# Patient Record
Sex: Female | Born: 1935
Health system: Southern US, Community
[De-identification: ages and names within clinical notes are randomized; demographics above are authoritative.]

## PROBLEM LIST (undated history)

## (undated) DIAGNOSIS — N951 Menopausal and female climacteric states: Secondary | ICD-10-CM

## (undated) DIAGNOSIS — R252 Cramp and spasm: Secondary | ICD-10-CM

## (undated) DIAGNOSIS — R911 Solitary pulmonary nodule: Secondary | ICD-10-CM

## (undated) DIAGNOSIS — J45909 Unspecified asthma, uncomplicated: Secondary | ICD-10-CM

## (undated) DIAGNOSIS — M797 Fibromyalgia: Secondary | ICD-10-CM

## (undated) DIAGNOSIS — C4492 Squamous cell carcinoma of skin, unspecified: Secondary | ICD-10-CM

## (undated) DIAGNOSIS — I499 Cardiac arrhythmia, unspecified: Secondary | ICD-10-CM

## (undated) DIAGNOSIS — K648 Other hemorrhoids: Secondary | ICD-10-CM

## (undated) DIAGNOSIS — E785 Hyperlipidemia, unspecified: Secondary | ICD-10-CM

## (undated) DIAGNOSIS — M1990 Unspecified osteoarthritis, unspecified site: Secondary | ICD-10-CM

## (undated) DIAGNOSIS — K5790 Diverticulosis of intestine, part unspecified, without perforation or abscess without bleeding: Secondary | ICD-10-CM

## (undated) DIAGNOSIS — N390 Urinary tract infection, site not specified: Secondary | ICD-10-CM

## (undated) DIAGNOSIS — K635 Polyp of colon: Secondary | ICD-10-CM

## (undated) DIAGNOSIS — E039 Hypothyroidism, unspecified: Secondary | ICD-10-CM

## (undated) DIAGNOSIS — L439 Lichen planus, unspecified: Secondary | ICD-10-CM

## (undated) HISTORY — DX: Lichen planus, unspecified: L43.9

## (undated) HISTORY — DX: Solitary pulmonary nodule: R91.1

## (undated) HISTORY — PX: SKIN CANCER EXCISION: SHX779

## (undated) HISTORY — DX: Hyperlipidemia, unspecified: E78.5

## (undated) HISTORY — DX: Cramp and spasm: R25.2

## (undated) HISTORY — PX: KNEE SURGERY: SHX244

## (undated) HISTORY — DX: Hypothyroidism, unspecified: E03.9

## (undated) HISTORY — PX: CHOLECYSTECTOMY: SHX55

## (undated) HISTORY — DX: Other hemorrhoids: K64.8

## (undated) HISTORY — PX: TOE SURGERY: SHX1073

## (undated) HISTORY — DX: Urinary tract infection, site not specified: N39.0

## (undated) HISTORY — DX: Unspecified osteoarthritis, unspecified site: M19.90

## (undated) HISTORY — PX: ROTATOR CUFF REPAIR: SHX139

## (undated) HISTORY — PX: MULTIPLE TOOTH EXTRACTIONS: SHX2053

## (undated) HISTORY — DX: Polyp of colon: K63.5

## (undated) HISTORY — DX: Squamous cell carcinoma of skin, unspecified: C44.92

## (undated) HISTORY — PX: BREAST BIOPSY: SHX20

## (undated) HISTORY — PX: CATARACT EXTRACTION: SUR2

## (undated) HISTORY — DX: Menopausal and female climacteric states: N95.1

## (undated) HISTORY — DX: Diverticulosis of intestine, part unspecified, without perforation or abscess without bleeding: K57.90

## (undated) HISTORY — DX: Fibromyalgia: M79.7

---
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
## 1973-08-26 HISTORY — PX: ABDOMINAL HYSTERECTOMY: SUR658

## 1973-08-26 HISTORY — PX: APPENDECTOMY: SHX54

## 1996-08-26 HISTORY — PX: COLONOSCOPY: SHX174

## 1997-04-01 ENCOUNTER — Encounter: Payer: Self-pay | Admitting: Family Medicine

## 1997-04-08 ENCOUNTER — Encounter: Payer: Self-pay | Admitting: Family Medicine

## 1997-08-26 DIAGNOSIS — K635 Polyp of colon: Secondary | ICD-10-CM

## 1997-08-26 HISTORY — DX: Polyp of colon: K63.5

## 1997-12-14 ENCOUNTER — Encounter: Admission: RE | Admit: 1997-12-14 | Discharge: 1998-03-14 | Payer: Self-pay | Admitting: Family Medicine

## 1998-02-20 ENCOUNTER — Encounter: Admission: RE | Admit: 1998-02-20 | Discharge: 1998-05-21 | Payer: Self-pay

## 1998-11-25 HISTORY — PX: OTHER SURGICAL HISTORY: SHX169

## 1999-06-25 ENCOUNTER — Ambulatory Visit (HOSPITAL_COMMUNITY): Admission: RE | Admit: 1999-06-25 | Discharge: 1999-06-25 | Payer: Self-pay | Admitting: Family Medicine

## 1999-07-18 ENCOUNTER — Encounter: Payer: Self-pay | Admitting: Family Medicine

## 1999-07-18 ENCOUNTER — Encounter: Admission: RE | Admit: 1999-07-18 | Discharge: 1999-07-18 | Payer: Self-pay | Admitting: Family Medicine

## 2000-07-21 ENCOUNTER — Encounter: Admission: RE | Admit: 2000-07-21 | Discharge: 2000-07-21 | Payer: Self-pay | Admitting: *Deleted

## 2000-07-21 ENCOUNTER — Encounter: Payer: Self-pay | Admitting: *Deleted

## 2001-01-24 HISTORY — PX: OTHER SURGICAL HISTORY: SHX169

## 2001-02-10 ENCOUNTER — Encounter: Admission: RE | Admit: 2001-02-10 | Discharge: 2001-02-10 | Payer: Self-pay | Admitting: Family Medicine

## 2001-02-10 ENCOUNTER — Encounter: Payer: Self-pay | Admitting: Family Medicine

## 2001-02-23 ENCOUNTER — Encounter: Payer: Self-pay | Admitting: Family Medicine

## 2001-02-23 ENCOUNTER — Ambulatory Visit (HOSPITAL_COMMUNITY): Admission: RE | Admit: 2001-02-23 | Discharge: 2001-02-23 | Payer: Self-pay | Admitting: Family Medicine

## 2001-10-24 HISTORY — PX: OTHER SURGICAL HISTORY: SHX169

## 2002-08-26 HISTORY — PX: ESOPHAGOGASTRODUODENOSCOPY: SHX1529

## 2002-09-26 HISTORY — PX: COLONOSCOPY: SHX174

## 2002-09-27 ENCOUNTER — Encounter: Admission: RE | Admit: 2002-09-27 | Discharge: 2002-12-16 | Payer: Self-pay

## 2003-07-28 ENCOUNTER — Encounter: Admission: RE | Admit: 2003-07-28 | Discharge: 2003-09-15 | Payer: Self-pay | Admitting: Orthopaedic Surgery

## 2004-10-10 ENCOUNTER — Ambulatory Visit: Payer: Self-pay | Admitting: Family Medicine

## 2004-10-16 ENCOUNTER — Ambulatory Visit: Payer: Self-pay | Admitting: Family Medicine

## 2004-12-03 ENCOUNTER — Encounter: Admission: RE | Admit: 2004-12-03 | Discharge: 2005-03-03 | Payer: Self-pay | Admitting: Orthopaedic Surgery

## 2005-01-09 ENCOUNTER — Ambulatory Visit: Payer: Self-pay | Admitting: Family Medicine

## 2005-01-23 ENCOUNTER — Ambulatory Visit: Payer: Self-pay | Admitting: Family Medicine

## 2005-01-23 LAB — FECAL OCCULT BLOOD, GUAIAC: Fecal Occult Blood: NEGATIVE

## 2005-03-27 ENCOUNTER — Ambulatory Visit: Payer: Self-pay | Admitting: Family Medicine

## 2005-06-11 ENCOUNTER — Ambulatory Visit: Payer: Self-pay | Admitting: Family Medicine

## 2005-09-27 ENCOUNTER — Ambulatory Visit: Payer: Self-pay | Admitting: Family Medicine

## 2006-05-27 ENCOUNTER — Ambulatory Visit: Payer: Self-pay | Admitting: Family Medicine

## 2006-07-14 ENCOUNTER — Ambulatory Visit: Payer: Self-pay | Admitting: Family Medicine

## 2006-07-28 ENCOUNTER — Ambulatory Visit: Payer: Self-pay | Admitting: Family Medicine

## 2006-08-18 ENCOUNTER — Ambulatory Visit: Payer: Self-pay | Admitting: Family Medicine

## 2006-12-12 ENCOUNTER — Ambulatory Visit: Payer: Self-pay | Admitting: Family Medicine

## 2007-05-27 ENCOUNTER — Ambulatory Visit: Payer: Self-pay | Admitting: Family Medicine

## 2007-05-28 ENCOUNTER — Encounter: Payer: Self-pay | Admitting: Family Medicine

## 2007-07-01 ENCOUNTER — Ambulatory Visit: Payer: Self-pay | Admitting: Family Medicine

## 2007-07-02 ENCOUNTER — Telehealth (INDEPENDENT_AMBULATORY_CARE_PROVIDER_SITE_OTHER): Payer: Self-pay | Admitting: Internal Medicine

## 2007-07-13 ENCOUNTER — Telehealth: Payer: Self-pay | Admitting: Family Medicine

## 2007-12-09 ENCOUNTER — Encounter: Payer: Self-pay | Admitting: Family Medicine

## 2007-12-25 HISTORY — PX: COLONOSCOPY: SHX174

## 2007-12-29 ENCOUNTER — Ambulatory Visit: Payer: Self-pay | Admitting: Internal Medicine

## 2008-01-07 ENCOUNTER — Ambulatory Visit: Payer: Self-pay | Admitting: Internal Medicine

## 2008-01-07 LAB — HM COLONOSCOPY

## 2008-01-25 ENCOUNTER — Ambulatory Visit: Payer: Self-pay | Admitting: Family Medicine

## 2008-01-25 DIAGNOSIS — M1990 Unspecified osteoarthritis, unspecified site: Secondary | ICD-10-CM | POA: Insufficient documentation

## 2008-01-25 DIAGNOSIS — IMO0001 Reserved for inherently not codable concepts without codable children: Secondary | ICD-10-CM | POA: Insufficient documentation

## 2008-01-25 DIAGNOSIS — K589 Irritable bowel syndrome without diarrhea: Secondary | ICD-10-CM | POA: Insufficient documentation

## 2008-01-25 DIAGNOSIS — J309 Allergic rhinitis, unspecified: Secondary | ICD-10-CM | POA: Insufficient documentation

## 2008-01-26 ENCOUNTER — Encounter: Payer: Self-pay | Admitting: Family Medicine

## 2008-03-07 ENCOUNTER — Ambulatory Visit: Payer: Self-pay | Admitting: Family Medicine

## 2008-03-07 DIAGNOSIS — I10 Essential (primary) hypertension: Secondary | ICD-10-CM | POA: Insufficient documentation

## 2008-03-29 ENCOUNTER — Ambulatory Visit: Payer: Self-pay | Admitting: Family Medicine

## 2008-03-29 DIAGNOSIS — M810 Age-related osteoporosis without current pathological fracture: Secondary | ICD-10-CM | POA: Insufficient documentation

## 2008-03-29 DIAGNOSIS — N951 Menopausal and female climacteric states: Secondary | ICD-10-CM | POA: Insufficient documentation

## 2008-03-29 DIAGNOSIS — M8580 Other specified disorders of bone density and structure, unspecified site: Secondary | ICD-10-CM | POA: Insufficient documentation

## 2008-03-30 LAB — CONVERTED CEMR LAB
ALT: 29 units/L (ref 0–35)
AST: 28 units/L (ref 0–37)
Albumin: 3.8 g/dL (ref 3.5–5.2)
Alkaline Phosphatase: 65 units/L (ref 39–117)
BUN: 13 mg/dL (ref 6–23)
Bilirubin, Direct: 0.1 mg/dL (ref 0.0–0.3)
CO2: 27 meq/L (ref 19–32)
Calcium: 9.2 mg/dL (ref 8.4–10.5)
Chloride: 102 meq/L (ref 96–112)
Cholesterol: 240 mg/dL (ref 0–200)
Creatinine, Ser: 0.9 mg/dL (ref 0.4–1.2)
Direct LDL: 136 mg/dL
GFR calc Af Amer: 79 mL/min
GFR calc non Af Amer: 65 mL/min
Glucose, Bld: 83 mg/dL (ref 70–99)
HDL: 74.5 mg/dL (ref >39.0)
Phosphorus: 3.6 mg/dL (ref 2.3–4.6)
Potassium: 4.3 meq/L (ref 3.5–5.1)
Sodium: 138 meq/L (ref 135–145)
TSH: 2.35 microintl units/mL (ref 0.35–5.50)
Total Bilirubin: 0.8 mg/dL (ref 0.3–1.2)
Total CHOL/HDL Ratio: 3.2
Total Protein: 7.1 g/dL (ref 6.0–8.3)
Triglycerides: 111 mg/dL (ref 0–149)
VLDL: 22 mg/dL (ref 0–40)

## 2008-03-31 LAB — CONVERTED CEMR LAB: Vit D, 1,25-Dihydroxy: 22 — ABNORMAL LOW (ref 30–89)

## 2008-04-07 ENCOUNTER — Encounter: Admission: RE | Admit: 2008-04-07 | Discharge: 2008-04-07 | Payer: Self-pay | Admitting: Family Medicine

## 2008-04-07 LAB — HM MAMMOGRAPHY: HM Mammogram: NORMAL

## 2008-04-11 ENCOUNTER — Encounter (INDEPENDENT_AMBULATORY_CARE_PROVIDER_SITE_OTHER): Payer: Self-pay | Admitting: *Deleted

## 2008-05-24 ENCOUNTER — Encounter (INDEPENDENT_AMBULATORY_CARE_PROVIDER_SITE_OTHER): Payer: Self-pay | Admitting: *Deleted

## 2008-06-06 ENCOUNTER — Ambulatory Visit: Payer: Self-pay | Admitting: Family Medicine

## 2008-06-06 DIAGNOSIS — E559 Vitamin D deficiency, unspecified: Secondary | ICD-10-CM | POA: Insufficient documentation

## 2008-06-06 DIAGNOSIS — E785 Hyperlipidemia, unspecified: Secondary | ICD-10-CM | POA: Insufficient documentation

## 2008-06-08 LAB — CONVERTED CEMR LAB
ALT: 18 units/L (ref 0–35)
AST: 16 units/L (ref 0–37)
Cholesterol: 236 mg/dL (ref 0–200)
Direct LDL: 123.6 mg/dL
HDL: 85.9 mg/dL (ref >39.0)
Total CHOL/HDL Ratio: 2.7
Triglycerides: 79 mg/dL (ref 0–149)
VLDL: 16 mg/dL (ref 0–40)
Vit D, 1,25-Dihydroxy: 29 — ABNORMAL LOW (ref 30–89)

## 2008-08-09 ENCOUNTER — Ambulatory Visit: Payer: Self-pay | Admitting: Family Medicine

## 2008-08-09 ENCOUNTER — Telehealth (INDEPENDENT_AMBULATORY_CARE_PROVIDER_SITE_OTHER): Payer: Self-pay | Admitting: Internal Medicine

## 2008-08-09 LAB — CONVERTED CEMR LAB
Bacteria, UA: 0
Bilirubin Urine: NEGATIVE
Blood in Urine, dipstick: NEGATIVE
Glucose, Urine, Semiquant: NEGATIVE
Ketones, urine, test strip: NEGATIVE
Nitrite: NEGATIVE
Specific Gravity, Urine: 1.015
Urobilinogen, UA: 0.2
pH: 6

## 2008-08-24 ENCOUNTER — Ambulatory Visit: Payer: Self-pay | Admitting: Family Medicine

## 2008-08-24 DIAGNOSIS — E039 Hypothyroidism, unspecified: Secondary | ICD-10-CM | POA: Insufficient documentation

## 2008-08-25 LAB — CONVERTED CEMR LAB
Basophils Absolute: 0 10*3/uL (ref 0.0–0.1)
Basophils Relative: 0.2 % (ref 0.0–3.0)
Eosinophils Absolute: 0.3 10*3/uL (ref 0.0–0.7)
Eosinophils Relative: 3.2 % (ref 0.0–5.0)
HCT: 39.9 % (ref 36.0–46.0)
Hemoglobin: 13.8 g/dL (ref 12.0–15.0)
Lymphocytes Relative: 37.5 % (ref 12.0–46.0)
MCHC: 34.7 g/dL (ref 30.0–36.0)
MCV: 94.1 fL (ref 78.0–100.0)
Monocytes Absolute: 0.6 10*3/uL (ref 0.1–1.0)
Monocytes Relative: 7.7 % (ref 3.0–12.0)
Neutro Abs: 4.2 10*3/uL (ref 1.4–7.7)
Neutrophils Relative %: 51.4 % (ref 43.0–77.0)
Platelets: 284 10*3/uL (ref 150–400)
RBC: 4.24 M/uL (ref 3.87–5.11)
RDW: 11.9 % (ref 11.5–14.6)
T4, Total: 11.1 ug/dL (ref 5.0–12.5)
TSH: 1.9 microintl units/mL (ref 0.35–5.50)
WBC: 8.1 10*3/uL (ref 4.5–10.5)

## 2008-08-29 LAB — CONVERTED CEMR LAB: Vit D, 1,25-Dihydroxy: 25 — ABNORMAL LOW (ref 30–89)

## 2008-11-07 ENCOUNTER — Telehealth: Payer: Self-pay | Admitting: Family Medicine

## 2008-11-09 ENCOUNTER — Ambulatory Visit: Payer: Self-pay | Admitting: Family Medicine

## 2008-11-11 LAB — CONVERTED CEMR LAB: Vit D, 25-Hydroxy: 35 ng/mL (ref 30–89)

## 2009-02-06 ENCOUNTER — Telehealth: Payer: Self-pay | Admitting: Family Medicine

## 2009-02-09 ENCOUNTER — Ambulatory Visit: Payer: Self-pay | Admitting: Family Medicine

## 2009-02-14 LAB — CONVERTED CEMR LAB: Vit D, 25-Hydroxy: 27 ng/mL — ABNORMAL LOW (ref 30–89)

## 2009-04-03 ENCOUNTER — Telehealth: Payer: Self-pay | Admitting: Family Medicine

## 2009-04-18 ENCOUNTER — Ambulatory Visit: Payer: Self-pay | Admitting: Family Medicine

## 2009-04-20 ENCOUNTER — Ambulatory Visit: Payer: Self-pay | Admitting: Family Medicine

## 2009-04-20 LAB — CONVERTED CEMR LAB: Vit D, 25-Hydroxy: 38 ng/mL (ref 30–89)

## 2009-05-04 ENCOUNTER — Encounter (INDEPENDENT_AMBULATORY_CARE_PROVIDER_SITE_OTHER): Payer: Self-pay | Admitting: *Deleted

## 2009-06-22 ENCOUNTER — Ambulatory Visit: Payer: Self-pay | Admitting: Family Medicine

## 2009-06-26 ENCOUNTER — Encounter (INDEPENDENT_AMBULATORY_CARE_PROVIDER_SITE_OTHER): Payer: Self-pay | Admitting: *Deleted

## 2009-06-26 LAB — CONVERTED CEMR LAB
ALT: 17 units/L (ref 0–35)
AST: 23 units/L (ref 0–37)
Albumin: 3.5 g/dL (ref 3.5–5.2)
BUN: 18 mg/dL (ref 6–23)
CO2: 24 meq/L (ref 19–32)
Calcium: 8.5 mg/dL (ref 8.4–10.5)
Chloride: 100 meq/L (ref 96–112)
Cholesterol: 226 mg/dL — ABNORMAL HIGH (ref 0–200)
Creatinine, Ser: 0.8 mg/dL (ref 0.4–1.2)
Direct LDL: 129.6 mg/dL
GFR calc non Af Amer: 74.48 mL/min (ref >60)
Glucose, Bld: 84 mg/dL (ref 70–99)
HDL: 79 mg/dL (ref >39.00)
Phosphorus: 3.1 mg/dL (ref 2.3–4.6)
Potassium: 4.5 meq/L (ref 3.5–5.1)
Sodium: 134 meq/L — ABNORMAL LOW (ref 135–145)
TSH: 1.45 microintl units/mL (ref 0.35–5.50)
Total CHOL/HDL Ratio: 3
Triglycerides: 146 mg/dL (ref 0.0–149.0)
VLDL: 29.2 mg/dL (ref 0.0–40.0)
Vit D, 25-Hydroxy: 28 ng/mL — ABNORMAL LOW (ref 30–89)

## 2009-07-10 ENCOUNTER — Telehealth: Payer: Self-pay | Admitting: Family Medicine

## 2009-07-12 ENCOUNTER — Ambulatory Visit: Payer: Self-pay | Admitting: Family Medicine

## 2009-07-12 LAB — CONVERTED CEMR LAB
Bilirubin Urine: NEGATIVE
Blood in Urine, dipstick: NEGATIVE
Glucose, Urine, Semiquant: NEGATIVE
Ketones, urine, test strip: NEGATIVE
Nitrite: NEGATIVE
Protein, U semiquant: NEGATIVE
Specific Gravity, Urine: >=1.03
Urobilinogen, UA: 0.2
WBC Urine, dipstick: NEGATIVE
pH: 7.5

## 2009-07-13 ENCOUNTER — Encounter: Admission: RE | Admit: 2009-07-13 | Discharge: 2009-07-13 | Payer: Self-pay | Admitting: Family Medicine

## 2009-07-13 LAB — CONVERTED CEMR LAB
ALT: 18 units/L (ref 0–35)
AST: 20 units/L (ref 0–37)
Albumin: 3.3 g/dL — ABNORMAL LOW (ref 3.5–5.2)
Alkaline Phosphatase: 50 units/L (ref 39–117)
Amylase: 53 units/L (ref 27–131)
BUN: 13 mg/dL (ref 6–23)
Basophils Absolute: 0 10*3/uL (ref 0.0–0.1)
Basophils Relative: 0.2 % (ref 0.0–3.0)
Bilirubin, Direct: 0.1 mg/dL (ref 0.0–0.3)
CO2: 26 meq/L (ref 19–32)
Calcium: 8.5 mg/dL (ref 8.4–10.5)
Chloride: 105 meq/L (ref 96–112)
Creatinine, Ser: 0.9 mg/dL (ref 0.4–1.2)
Eosinophils Absolute: 0.2 10*3/uL (ref 0.0–0.7)
Eosinophils Relative: 3.2 % (ref 0.0–5.0)
GFR calc non Af Amer: 65 mL/min (ref >60)
Glucose, Bld: 97 mg/dL (ref 70–99)
HCT: 40.7 % (ref 36.0–46.0)
Hemoglobin: 13.8 g/dL (ref 12.0–15.0)
Lipase: 15 units/L (ref 11.0–59.0)
Lymphocytes Relative: 38.2 % (ref 12.0–46.0)
Lymphs Abs: 2.9 10*3/uL (ref 0.7–4.0)
MCHC: 34 g/dL (ref 30.0–36.0)
MCV: 96.2 fL (ref 78.0–100.0)
Monocytes Absolute: 0.5 10*3/uL (ref 0.1–1.0)
Monocytes Relative: 6.5 % (ref 3.0–12.0)
Neutro Abs: 4 10*3/uL (ref 1.4–7.7)
Neutrophils Relative %: 51.9 % (ref 43.0–77.0)
Platelets: 282 10*3/uL (ref 150.0–400.0)
Potassium: 4.2 meq/L (ref 3.5–5.1)
RBC: 4.23 M/uL (ref 3.87–5.11)
RDW: 12.3 % (ref 11.5–14.6)
Sodium: 140 meq/L (ref 135–145)
Total Bilirubin: 0.8 mg/dL (ref 0.3–1.2)
Total Protein: 6.4 g/dL (ref 6.0–8.3)
WBC: 7.6 10*3/uL (ref 4.5–10.5)

## 2009-07-17 ENCOUNTER — Telehealth: Payer: Self-pay | Admitting: Family Medicine

## 2009-08-30 ENCOUNTER — Ambulatory Visit: Payer: Self-pay | Admitting: Family Medicine

## 2009-08-31 ENCOUNTER — Telehealth: Payer: Self-pay | Admitting: Family Medicine

## 2009-09-01 LAB — CONVERTED CEMR LAB: Vit D, 25-Hydroxy: 40 ng/mL (ref 30–89)

## 2009-09-04 ENCOUNTER — Encounter: Admission: RE | Admit: 2009-09-04 | Discharge: 2009-09-04 | Payer: Self-pay | Admitting: Family Medicine

## 2009-09-04 ENCOUNTER — Ambulatory Visit: Payer: Self-pay | Admitting: Family Medicine

## 2009-09-18 ENCOUNTER — Encounter: Payer: Self-pay | Admitting: Family Medicine

## 2009-09-20 ENCOUNTER — Encounter: Payer: Self-pay | Admitting: Family Medicine

## 2009-10-11 ENCOUNTER — Encounter (INDEPENDENT_AMBULATORY_CARE_PROVIDER_SITE_OTHER): Payer: Self-pay | Admitting: General Surgery

## 2009-10-11 ENCOUNTER — Ambulatory Visit (HOSPITAL_COMMUNITY): Admission: RE | Admit: 2009-10-11 | Discharge: 2009-10-11 | Payer: Self-pay | Admitting: General Surgery

## 2009-11-27 ENCOUNTER — Telehealth: Payer: Self-pay | Admitting: Family Medicine

## 2010-01-01 ENCOUNTER — Ambulatory Visit: Payer: Self-pay | Admitting: Family Medicine

## 2010-01-02 LAB — CONVERTED CEMR LAB
Free T4: 0.8 ng/dL (ref 0.6–1.6)
TSH: 2.98 microintl units/mL (ref 0.35–5.50)
Vit D, 25-Hydroxy: 55 ng/mL (ref 30–89)

## 2010-01-10 ENCOUNTER — Telehealth: Payer: Self-pay | Admitting: Family Medicine

## 2010-01-24 ENCOUNTER — Telehealth: Payer: Self-pay | Admitting: Family Medicine

## 2010-01-24 DIAGNOSIS — M19049 Primary osteoarthritis, unspecified hand: Secondary | ICD-10-CM | POA: Insufficient documentation

## 2010-02-13 ENCOUNTER — Ambulatory Visit: Payer: Self-pay | Admitting: Family Medicine

## 2010-04-02 ENCOUNTER — Telehealth: Payer: Self-pay | Admitting: Family Medicine

## 2010-06-19 ENCOUNTER — Encounter: Payer: Self-pay | Admitting: Family Medicine

## 2010-07-27 ENCOUNTER — Ambulatory Visit: Payer: Self-pay | Admitting: Family Medicine

## 2010-08-15 ENCOUNTER — Ambulatory Visit: Payer: Self-pay | Admitting: Family Medicine

## 2010-08-16 ENCOUNTER — Encounter: Payer: Self-pay | Admitting: Family Medicine

## 2010-09-24 ENCOUNTER — Telehealth: Payer: Self-pay | Admitting: Family Medicine

## 2010-09-25 NOTE — Assessment & Plan Note (Signed)
Summary: FLU SHOT/CLE   Nurse Visit       Influenza Vaccine    Vaccine Type: Fluvax 3+    Site: left deltoid    Mfr: Sanofi Pasteur    Dose: 0.5 ml    Route: IM    Given by: Delilah Shan    Exp. Date: 02/23/2008    Lot #: [id number]    VIS given: 02/22/05 version given May 27, 2007.  Flu Vaccine Consent Questions    Do you have a history of severe allergic reactions to this vaccine? no    Any prior history of allergic reactions to egg and/or gelatin? no    Do you have a sensitivity to the preservative Thimersol? no    Do you have a past history of Guillan-Barre Syndrome? no    Do you currently have an acute febrile illness? no    Have you ever had a severe reaction to latex? no    Vaccine information given and explained to patient? yes    Are you currently pregnant? no   Orders Added: 1)  Flu Vaccine 93yrs + [90658] 2)  Admin 1st Vaccine Mishka.Peer    ]

## 2010-09-25 NOTE — Letter (Signed)
Summary: Surgery Date/[facility]  Surgery Date/[facility]   Imported By: Lanelle Bal 09/27/2009 13:17:45  _____________________________________________________________________  External Attachment:    Type:   Image     Comment:   External Document

## 2010-09-25 NOTE — Letter (Signed)
Summary: [facility]   Imported By: Lanelle Bal 07/06/2010 10:57:47  _____________________________________________________________________  External Attachment:    Type:   Image     Comment:   External Document

## 2010-09-25 NOTE — Progress Notes (Signed)
Summary: Flexeril refill  Phone Note Refill Request Call back at [facility] [address] [phone] Message from:  Scriptline on February 06, 2009 11:03 AM  Refills Requested: Medication #1:  FLEXERIL 10 MG  TABS take one by mouth at bedtime as needed   Last Refilled: 01/06/2009 Scriptline request for refill on Flexeril. Please advise.   Method Requested: Electronic Initial call taken by: Lewanda Rife,  February 06, 2009 11:03 AM  Follow-up for Phone Call        px written on EMR for call in  Follow-up by: Judith Part MD,  February 06, 2009 11:13 AM  Additional Follow-up for Phone Call Additional follow up Details #1::        Medication called to [facility] [address] as instructed by telephone. Additional Follow-up by: Lewanda Rife,  February 06, 2009 12:51 PM      Prescriptions: FLEXERIL 10 MG  TABS (CYCLOBENZAPRINE HCL) take one by mouth at bedtime as needed  #30 x 2   Entered and Authorized by:   Judith Part MD   Signed by:   Judith Part MD on 02/06/2009   Method used:   Telephoned to ...       [facility] (retail)       [address]       [place], [place]  [address]       Ph: [phone]       Fax: [phone]   RxID:   [id number]

## 2010-09-25 NOTE — Progress Notes (Signed)
Summary: cyst on hand, hand swelling  Phone Note Call from Patient Call back at [phone]   Caller: Patient Call For: Judith Part MD Summary of Call: Patient sent in fax asking for you to recommend a "hand Doctor". She says that the cyst on her finger is full again and there is another one beside it now. Hand continues to swell, she is concerned that she has injured it. Fax from patient is in your in box.  Initial call taken by: Melody Comas,  January 24, 2010 4:37 PM  Follow-up for Phone Call        I recommend Dr Amanda Pea or Dr Teressa Senter  I will do ref and route to [facility] Follow-up by: Judith Part MD,  January 25, 2010 8:09 AM  Additional Follow-up for Phone Call Additional follow up Details #1::        Patient notified as instructed by telephone. Pt said she could not go to dr on Mon and would prefer appt if can do next week on Wed or Thur. Pt can be reached at [phone] cell. Pt will wait to hear from [facility] or [place].Lewanda Rife LPN  January 26, 4539 8:28 AM   New Problems: HAND PAIN (ICD-729.5)   Additional Follow-up for Phone Call Additional follow up Details #2::    Appt was made for the patient on 03/01/2010 bur patients hand got better so she ended up cancelling the appt. Follow-up by: Carlton Adam,  February 06, 2010 10:38 AM  New Problems: HAND PAIN (ICD-729.5)

## 2010-09-25 NOTE — Miscellaneous (Signed)
Summary: flexeril  Medications Added FLEXERIL 10 MG  TABS (CYCLOBENZAPRINE HCL) take one by mouth q hs prn       Clinical Lists Changes  Medications: Added new medication of FLEXERIL 10 MG  TABS (CYCLOBENZAPRINE HCL) take one by mouth q hs prn - Signed Rx of FLEXERIL 10 MG  TABS (CYCLOBENZAPRINE HCL) take one by mouth q hs prn;  #30 x 1;  Signed;  Entered by: Lowella Petties;  Authorized by: Judith Part MD;  Method used: Telephoned to [facility]*, [address] [place], [place], [place]  [address], Ph: [phone], Fax: [phone]    Prescriptions: FLEXERIL 10 MG  TABS (CYCLOBENZAPRINE HCL) take one by mouth q hs prn  #30 x 1   Entered by:   Lowella Petties   Authorized by:   Judith Part MD   Signed by:   Lowella Petties on 05/28/2007   Method used:   Telephoned to ...       [facility]       [address]       [place], [place]  [address]       Ph: [phone]       Fax: [phone]   RxID:   [id number]    Prior Medications: FLEXERIL 10 MG  TABS (CYCLOBENZAPRINE HCL) take one by mouth q hs prn

## 2010-09-25 NOTE — Progress Notes (Signed)
Summary: regarding lung nodule  Phone Note Call from Patient Call back at [phone]   Caller: Patient Call For: Judith Part MD Summary of Call: Pt is asking if she needs to follow up regarding the lung nodule that she has.  She says she talked with her surgeon briefly about it after the scan was done in january, prior to her surgery in february,  but he isnt a pulmonary surgeon, and he just told her that she needs to have it checked.  What do you suggest she do, if anything? Initial call taken by: Lowella Petties CMA,  Jan 10, 2010 12:56 PM  Follow-up for Phone Call        upon record review - she had this nodule on old cxr and CT of lung in 1998 - so I am not particularly concerned  the surgeon may not have known this  if she is concerned about it or has any lung symptoms , let me know  we could continue to follow with occ CT scan - say, in 6-12 months   Follow-up by: Judith Part MD,  Jan 10, 2010 1:40 PM  Additional Follow-up for Phone Call Additional follow up Details #1::        Patient notified as instructed by telephone. Pt said she is not having any lung symptoms at the present time.Lewanda Rife LPN  Jan 10, 2010 4:32 PM       Past Surgical History:    colonoscopy 5/09 small polyp --- re check in 5 years     Hysterectomy- total, fibroids    Breast biopsy- benign    Rotator cuff repair    Skin lesion- pre melanoma    Abd [place]- neg (11/1998)    Abd US- gallbladder polyps (01/2001)    Hida scan- neg (01/2001)    Colonoscpy- diverticulosis, polyp (1998)    Dexa- osteopenia (10/2001)    EGD (2004)    Cataracts    Colonoscopy- diverticulosis, hem (11/2002)    Right     surgery-orthoscopic for tear  (04/206)    Colonoscopy- diverticulosis, polyp (5/09)    stable lung nodule LUL 9 mm

## 2010-09-25 NOTE — Progress Notes (Signed)
Summary: Cyclobenzaprine  Phone Note Refill Request Message from:  Scriptline on April 02, 2010 12:13 PM  Refills Requested: Medication #1:  FLEXERIL 10 MG  TABS take one by mouth at bedtime as needed [facility]   Last Fill Date:  03/02/2010   Pharmacy Phone:  [phone]   Method Requested: Electronic Initial call taken by: Delilah Shan CMA Duncan Dull),  April 02, 2010 12:14 PM  Follow-up for Phone Call        px written on EMR for call in  Follow-up by: Judith Part MD,  April 02, 2010 12:23 PM  Additional Follow-up for Phone Call Additional follow up Details #1::        Rx called to pharmacy Additional Follow-up by: Linde Gillis CMA Duncan Dull),  April 02, 2010 1:47 PM    Prescriptions: FLEXERIL 10 MG  TABS (CYCLOBENZAPRINE HCL) take one by mouth at bedtime as needed  #30 x 5   Entered and Authorized by:   Judith Part MD   Signed by:   Linde Gillis CMA (AAMA) on 04/02/2010   Method used:   Telephoned to ...       [facility] (retail)       [address]       [place], [place]  [address]       Ph: [phone]       Fax: [phone]   RxID:   [id number]

## 2010-09-25 NOTE — Procedures (Signed)
Summary: Colonoscopy   Colonoscopy  Procedure date:  01/07/2008  Findings:      Location:  [facility].    Procedures Next Due Date:    Colonoscopy: 12/2012  Patient Name: Linda Mcgrath, Linda Mcgrath MRN:  Procedure Procedures: Colonoscopy CPT: [address].    with polypectomy. CPT: [id number].  Personnel: Endoscopist: Wilhemina Bonito. Marina Goodell, MD.  Exam Location: Exam performed in Outpatient Clinic. Outpatient  Patient Consent: Procedure, Alternatives, Risks and Benefits discussed, consent obtained, from patient. Consent was obtained by the RN.  Indications  Increased Risk Screening: For family history of colorectal neoplasia, in  sibling age at onset: 85.  History  Current Medications: Patient is not currently taking Coumadin.  Pre-Exam Physical: Performed Jan 07, 2008. Cardio-pulmonary exam, Rectal exam, HEENT exam , Abdominal exam, Mental status exam WNL.  Comments: Pt. history reviewed/updated, physical exam performed prior to initiation of sedation?YES Exam Exam: Extent of exam reached: Cecum, extent intended: Cecum.  The cecum was identified by appendiceal orifice and IC valve. Patient position: on left side. Time to Cecum: 00:03:55. Time for Withdrawl: 00:09:29. Colon retroflexion performed. Images taken. ASA Classification: II. Tolerance: excellent.  Monitoring: Pulse and BP monitoring, Oximetry used. Supplemental O2 given.  Colon Prep Used MIRALAX for colon prep. Prep results: excellent.  Sedation Meds: Patient assessed and found to be appropriate for moderate (conscious) sedation. Fentanyl 75 mcg. given IV. Versed 9 mg. given IV.  Findings POLYP: Sigmoid Colon, Maximum size: 1 mm. Procedure:  snare without cautery, removed, not retrieved, ICD9: Colon Polyps: 211.3.  - DIVERTICULOSIS: Descending Colon to Sigmoid Colon. ICD9: Diverticulosis, Colon: 562.10.  HEMORRHOIDS: Internal. ICD9: Hemorrhoids, Internal: 455.0.    Comments: PHOTO DOCUMENTATION NOT  AVAILABLE (SERVER DOWN) Assessment  Diagnoses: 562.10: Diverticulosis, Colon. MODERATE.  211.3: Colon Polyps.  1 TINY.  455.0: Hemorrhoids, Internal.   Events  Unplanned Interventions: No intervention was required.  Unplanned Events: There were no complications. Plans Disposition: After procedure patient sent to recovery. After recovery patient sent home.  Scheduling/Referral: Colonoscopy, to Wilhemina Bonito. Marina Goodell, MD, IN 5 YEARS (fam. hx),    cc: Roxy Manns MD     This report was created from the original endoscopy report, which was reviewed and signed the above listed endoscopist.    Appended Document: Colonoscopy    Clinical Lists Changes  Observations: Added new observation of COLONNXTDUE: 12/2012 (01/07/2008 17:10) Added new observation of COLONOSCOPY: Adenomatous Polyp (01/07/2008 17:10) Added new observation of PAST SURG HX: colonoscopy 5/09 small polyp --- re check in 5 years  (01/07/2008 17:10)        Past Surgical History:    colonoscopy 5/09 small polyp --- re check in 5 years     Preventive Care Screening  Colonoscopy:    Date:  01/07/2008    Next Due:  12/2012    Results:  Adenomatous Polyp

## 2010-09-25 NOTE — Miscellaneous (Signed)
Summary: [facility]/ [facility]  Clinical Lists Changes  Observations: Added new observation of FLU VAX: Historical (05/21/2008 8:25)      Influenza Immunization History:    Influenza # 1:  Historical (05/21/2008)

## 2010-09-25 NOTE — Assessment & Plan Note (Signed)
Summary: ?UTI/CLE   Vital Signs:  Patient Profile:   77 Years Old Female Weight:      207 pounds Temp:     98.0 degrees F oral Pulse rate:   76 / minute Pulse rhythm:   regular BP sitting:   158 / 88  (right arm) Cuff size:   large  Vitals Entered By: Sydell Axon (August 09, 2008 8:50 AM)                 Chief Complaint:  ? UTI and burning with urination.  History of Present Illness: Here for dysuria--onset x 10d ago.  Taking AZO, drinking water and cranberry juice--helped until 4-5d. --nocturia x 2 --unusual, burning on voiding, pressure in RLQ. --last one yrs ago.    Current Allergies (reviewed today): ! PCN ! CODEINE ! AMOXICILLIN ! TETRACYCLINE ! DARVON ! * SHELLFISH ! NORVASC (AMLODIPINE BESYLATE)     Review of Systems      See HPI   Physical Exam  General:     alert, well-developed, well-nourished, and well-hydrated.   Abdomen:     no CVA tenderness, tender over suprapubic area Psych:     normally interactive.      Impression & Recommendations:  Problem # 1:  UTI (ICD-599.0) Assessment: New will start on cipro two times a day x 7d discussed etiology and precautions increase water see bck if not improved in 3d Her updated medication list for this problem includes:    Cipro 500 Mg Tabs (Ciprofloxacin hcl) .Marland Kitchen... 1 two times a day by mouth  Orders: UA Dipstick W/ Micro (manual) (09811)   Complete Medication List: 1)  Flexeril 10 Mg Tabs (Cyclobenzaprine hcl) .... Take one by mouth at bedtime as needed 2)  Cenestin 1.25 Mg Tabs (Estrogens conj synthetic a) .... Take one by mouth daily 3)  Synthroid 75 Mcg Tabs (Levothyroxine sodium) .... Take one by mouth daily 4)  Flonase 50 Mcg/act Susp (Fluticasone propionate) .... 2 sprays in each nostril daily 5)  Allegra 180 Mg Tabs (Fexofenadine hcl) .Marland Kitchen.. 1 by mouth once daily as needed allergies 6)  Qualaquin 324 Mg Caps (Quinine sulfate) .... Take 2 by mouth three times a day as needed 7)   Calcium 1000 Mg With Mag 250 Plus Vit D  .... 1 pill daily 8)  Mvi  .Marland Kitchen.. 1  by mouth once daily 9)  Vitamin D 91478 Unit Caps (Ergocalciferol) .... One by mouth weekly 10)  Vitamin C 1000 Mg Tabs (Ascorbic acid) .... Take one by mouth daily 11)  Cipro 500 Mg Tabs (Ciprofloxacin hcl) .Marland Kitchen.. 1 two times a day by mouth    Prescriptions: CIPRO 500 MG TABS (CIPROFLOXACIN HCL) 1 two times a day by mouth  #14 x 0   Entered and Authorized by:   Gildardo Griffes FNP   Signed by:   Gildardo Griffes FNP on 08/09/2008   Method used:   Electronically to        [facility] (retail)       [address]       [place], [place]  [address]       Ph: [phone]       Fax: [phone]   RxID:   [id number]  ] Current Allergies (reviewed today): ! PCN ! CODEINE ! AMOXICILLIN ! TETRACYCLINE ! DARVON ! * SHELLFISH ! NORVASC (AMLODIPINE BESYLATE)  Laboratory Results   Urine Tests  Date/Time Received: August 09, 2008 8:52 AM  Date/Time Reported: August 09, 2008 8:52 AM   Routine Urinalysis   Color: yellow Appearance: Cloudy Glucose: negative   (Normal Range: Negative) Bilirubin: negative   (Normal Range: Negative) Ketone: negative   (Normal Range: Negative) Spec. Gravity: 1.015   (Normal Range: 1.003-1.035) Blood: negative   (Normal Range: Negative) pH: 6.0   (Normal Range: 5.0-8.0) Protein: trace   (Normal Range: Negative) Urobilinogen: 0.2   (Normal Range: 0-1) Nitrite: negative   (Normal Range: Negative) Leukocyte Esterace: moderate   (Normal Range: Negative)  Urine Microscopic WBC/HPF: 2-8 RBC/HPF: 0-1 Bacteria/HPF: 0 Epithelial/HPF: occ       Laboratory Results   Urine Tests    Routine Urinalysis   Color: yellow Appearance: Cloudy Glucose: negative   (Normal Range: Negative) Bilirubin: negative   (Normal Range: Negative) Ketone: negative   (Normal Range: Negative) Spec. Gravity: 1.015   (Normal Range:  1.003-1.035) Blood: negative   (Normal Range: Negative) pH: 6.0   (Normal Range: 5.0-8.0) Protein: trace   (Normal Range: Negative) Urobilinogen: 0.2   (Normal Range: 0-1) Nitrite: negative   (Normal Range: Negative) Leukocyte Esterace: moderate   (Normal Range: Negative)  Urine Microscopic WBC/hpf: 2-8 RBC/hpf: 0-1 Bacteria: 0 Epithelial: occ

## 2010-09-25 NOTE — Miscellaneous (Signed)
  Clinical Lists Changes  Medications: Added new medication of VITAMIN D 1000 UNIT TABS (CHOLECALCIFEROL) 4,000 units daily Removed medication of VITAMIN D 2000 UNIT TABS (CHOLECALCIFEROL) Take 1 tablet by mouth once a day

## 2010-09-25 NOTE — Progress Notes (Signed)
Summary: Flexeril  Phone Note Refill Request Message from:  Scriptline on July 10, 2009 9:16 AM  Refills Requested: Medication #1:  FLEXERIL 10 MG  TABS take one by mouth at bedtime as needed [facility]     Pharmacy Phone:  [phone]   Method Requested: Electronic Initial call taken by: Delilah Shan CMA Duncan Dull),  July 10, 2009 9:16 AM  Follow-up for Phone Call        px written on EMR for call in  Follow-up by: Judith Part MD,  July 10, 2009 9:26 AM    Prescriptions: FLEXERIL 10 MG  TABS (CYCLOBENZAPRINE HCL) take one by mouth at bedtime as needed  #30 x 0   Entered by:   Delilah Shan CMA (AAMA)   Authorized by:   Judith Part MD   Signed by:   Delilah Shan CMA (AAMA) on 07/10/2009   Method used:   Electronically to        [facility] (retail)       [address]       [place], [place]  [address]       Ph: [phone]       Fax: [phone]   RxID:   [id number]

## 2010-09-25 NOTE — Assessment & Plan Note (Signed)
Summary: CHECK LUMP ON RIGHT LEG/CLE   Vital Signs:  Patient profile:   77 year old female Height:      64.5 inches Weight:      205.25 pounds BMI:     34.81 Temp:     97.8 degrees F oral Pulse rate:   84 / minute Pulse rhythm:   regular BP sitting:   174 / 100  (left arm) Cuff size:   large  Vitals Entered By: Delilah Shan CMA  Dull) (July 27, 2010 3:07 PM) CC: Check lump on right leg behind knee   History of Present Illness: New skin lesion.  R medial knee, inferior to joint line. Minmially tender to palpation.  No FCNAV.  No drainage. Noted today.     Allergies: 1)  ! Pcn 2)  ! Codeine 3)  ! Amoxicillin 4)  ! Tetracycline 5)  ! Darvon 6)  ! * Shellfish 7)  ! Norvasc (Amlodipine Besylate) 8)  ! Cipro  Review of Systems       See HPI.  Otherwise negative.    Physical Exam  General:  NAD R knee with minimally erythematous 2cm, blanching lesion inferior to joint line.  Deeper palpation shows <1cm diameter lesion that is minimally tender to palpation.  No other skin findings.    Impression & Recommendations:  Problem # 1:  CELLULITIS/ABSCESS, LEG (ICD-682.6) Since this is so recent and small, I would not I&D as yield is likely to be low.  I would use warm compresses and start the antibiotics.  follow up as needed.  She agrees and understands.  I don't feel typical fluctuant mass that would likely benefit from I&D today.  Her updated medication list for this problem includes:    Septra Ds 800-160 Mg Tabs (Sulfamethoxazole-trimethoprim) .Marland Kitchen... 2 by mouth two times a day x10d  Orders: Prescription Created Electronically [phone])  Complete Medication List: 1)  Flexeril 10 Mg Tabs (Cyclobenzaprine hcl) .... Take one by mouth at bedtime as needed 2)  Cenestin 1.25 Mg Tabs (Estrogens conj synthetic a) .... Take one by mouth daily 3)  Synthroid 75 Mcg Tabs (Levothyroxine sodium) .... Take one by mouth daily 4)  Flonase 50 Mcg/act Susp (Fluticasone propionate) .... 2  sprays in each nostril daily as needed 5)  Qualaquin 324 Mg Caps (Quinine sulfate) .... Take 2 by mouth three times a day as needed 6)  Vitamin C 1000 Mg Tabs (Ascorbic acid) .... Take one by mouth daily 7)  Vitamin D 1000 Unit Tabs (Cholecalciferol) .... 4,000 units daily 8)  Fish Oil Oil (Fish oil) .... Take one by mouth daily 9)  Clobetasol Propionate 0.05 % Crea (Clobetasol propionate) .... Apply to area two times a day 10)  Biotin 10 Mg Tabs (Biotin) .... Daily 11)  Lutein 20 Mg Caps (Lutein) .... Daily 12)  Multivitamins Tabs (Multiple vitamin) .... Take 1 tablet by mouth once a day 13)  Calcium Carbonate-vitamin D 600-400 Mg-unit Tabs (Calcium carbonate-vitamin d) .... Daily 14)  Septra Ds 800-160 Mg Tabs (Sulfamethoxazole-trimethoprim) .... 2 by mouth two times a day x10d  Patient Instructions: 1)  Start the septra today.  Take 2 pills two times a day.  If the area is getting larger or if you have a fever, then notify the clinic or seek evaluation.  Warm compresses may make it more comfortable in the meantime.  Take care.  Prescriptions: SEPTRA DS 800-160 MG TABS (SULFAMETHOXAZOLE-TRIMETHOPRIM) 2 by mouth two times a day x10d  #40 x 0  Entered and Authorized by:   Crawford Givens MD   Signed by:   Crawford Givens MD on 07/27/2010   Method used:   Electronically to        [facility] (retail)       [address]       [place], [place]  [address]       Ph: [phone]       Fax: [phone]   RxID:   [id number]    Orders Added: 1)  Est. Patient Level III [84132] 2)  Prescription Created Electronically [phone]    Current Allergies (reviewed today): ! PCN ! CODEINE ! AMOXICILLIN ! TETRACYCLINE ! DARVON ! * SHELLFISH ! NORVASC (AMLODIPINE BESYLATE) ! CIPRO

## 2010-09-25 NOTE — Miscellaneous (Signed)
Summary: gi previsit  Clinical Lists Changes  Medications: Added new medication of MIRALAX   POWD (POLYETHYLENE GLYCOL 3350) As per prep  instructions. - Signed Added new medication of METOCLOPRAMIDE HCL 10 MG  TABS (METOCLOPRAMIDE HCL) As per prep instructions. - Signed Added new medication of DULCOLAX 5 MG  TBEC (BISACODYL) Day before procedure take 2 at 3pm and 2 at 8pm. - Signed Rx of MIRALAX   POWD (POLYETHYLENE GLYCOL 3350) As per prep  instructions.;  #255gm x 0;  Signed;  Entered by: Alease Frame RN;  Authorized by: Hilarie Fredrickson MD;  Method used: Electronic Rx of METOCLOPRAMIDE HCL 10 MG  TABS (METOCLOPRAMIDE HCL) As per prep instructions.;  #2 x 0;  Signed;  Entered by: Alease Frame RN;  Authorized by: Hilarie Fredrickson MD;  Method used: Electronic Rx of DULCOLAX 5 MG  TBEC (BISACODYL) Day before procedure take 2 at 3pm and 2 at 8pm.;  #4 x 0;  Signed;  Entered by: Alease Frame RN;  Authorized by: Hilarie Fredrickson MD;  Method used: Electronic Allergies: Changed allergy or adverse reaction from CODEINE to CODEINE Changed allergy or adverse reaction from TETRACYCLINE to TETRACYCLINE Observations: Added new observation of ALLERGY REV: Done (12/29/2007 14:49)    Prescriptions: DULCOLAX 5 MG  TBEC (BISACODYL) Day before procedure take 2 at 3pm and 2 at 8pm.  #4 x 0   Entered by:   Alease Frame RN   Authorized by:   Hilarie Fredrickson MD   Signed by:   Alease Frame RN on 12/29/2007   Method used:   Electronically sent to ...       [facility]       [address]       [place], [place]  [address]       Ph: [phone]       Fax: [phone]   RxID:   [id number] METOCLOPRAMIDE HCL 10 MG  TABS (METOCLOPRAMIDE HCL) As per prep instructions.  #2 x 0   Entered by:   Alease Frame RN   Authorized by:   Hilarie Fredrickson MD   Signed by:   Alease Frame RN on 12/29/2007   Method used:   Electronically sent to ...       [facility]       [address]       [place], [place]  [address]       Ph: [phone]       Fax: [phone]   RxID:   [id number] MIRALAX   POWD (POLYETHYLENE GLYCOL 3350) As per prep  instructions.  #255gm x 0   Entered by:   Alease Frame RN   Authorized by:   Hilarie Fredrickson MD   Signed by:   Alease Frame RN on 12/29/2007   Method used:   Electronically sent to ...       [facility]       [address]       [place], [place]  [address]       Ph: [phone]       Fax: [phone]   RxID:   [id number]

## 2010-09-25 NOTE — Progress Notes (Signed)
Summary: Cyclobenzaprine  Phone Note Refill Request Message from:  Scriptline on November 27, 2009 11:27 AM  Refills Requested: Medication #1:  FLEXERIL 10 MG  TABS take one by mouth at bedtime as needed [facility]   Last Fill Date:  10/02/2009   Pharmacy Phone:  [phone]   Method Requested: Telephone to Pharmacy Initial call taken by: Delilah Shan CMA Duncan Dull),  November 27, 2009 11:28 AM  Follow-up for Phone Call        px written on EMR for call in  Follow-up by: Judith Part MD,  November 27, 2009 11:42 AM  Additional Follow-up for Phone Call Additional follow up Details #1::        [facility] as instructed. Lewanda Rife LPN  November 28, 8467 3:02 PM     New/Updated Medications: FLEXERIL 10 MG  TABS (CYCLOBENZAPRINE HCL) take one by mouth at bedtime as needed Prescriptions: FLEXERIL 10 MG  TABS (CYCLOBENZAPRINE HCL) take one by mouth at bedtime as needed  #30 x 3   Entered and Authorized by:   Judith Part MD   Signed by:   Lewanda Rife LPN on 62/95/2841   Method used:   Telephoned to ...       [facility] (retail)       [address]       [place], [place]  [address]       Ph: [phone]       Fax: [phone]   RxID:   [id number]

## 2010-09-25 NOTE — Assessment & Plan Note (Signed)
Summary: SINUS INFECTION / LFW   Vital Signs:  Patient Profile:   77 Years Old Female Weight:      202 pounds Temp:     97.9 degrees F oral Pulse rate:   72 / minute Pulse rhythm:   regular BP sitting:   170 / 100  (left arm) Cuff size:   large  Vitals Entered By: Lowella Petties (January 25, 2008 1:22 PM)                 Chief Complaint:  ? allergies.  History of Present Illness: about 11/2 weeks of bad sneezing and runny nose sneezing goes on and on  all clear mucous so far is making her face hurt and L ear bothers her  eyes are watery too  tried claritin and zyrtec (zyrtec gave her a HA) then switched over to benadryl (allergy sinus)--has D on name of med  no new exposures no animals  usually has allergies this time of year-- usually gets better as tree pollen improves  had recent colonosc- it flared her hemorrhoids  also fibromyalgia got worse too     Current Allergies: ! PCN ! CODEINE ! AMOXICILLIN ! TETRACYCLINE ! DARVON ! * SHELLFISH  Past Surgical History:    Reviewed history from 01/07/2008 and no changes required:       colonoscopy 5/09 small polyp --- re check in 5 years    Social History:    non smoker    occ alcohol    Review of Systems  General      Complains of fatigue.      Denies chills and fever.  Eyes      Complains of discharge and itching.  ENT      Complains of earache, nasal congestion, postnasal drainage, and sinus pressure.      Denies hoarseness and sore throat.  Allergy      Complains of itching eyes, seasonal allergies, and sneezing.      Denies hives or rash.   Physical Exam  General:     overweight but generally well appearing  Head:     normocephalic, atraumatic, and no abnormalities observed.  no sinus tenderness  Eyes:     vision grossly intact, pupils equal, pupils round, pupils reactive to light, and no injection.   Ears:     R ear normal and L ear normal.   dead mosquito in L ear- very far into  canal pressed up against TM no erythema or drainage Nose:     nasal dischargemucosal pallor.   Mouth:     pharynx pink and moist, no erythema, no exudates, and postnasal drip.   Neck:     supple with full rom and no masses or thyromegally, no JVD or carotid bruit  Chest Wall:     No deformities, masses, or tenderness noted. Lungs:     Normal respiratory effort, chest expands symmetrically. Lungs are clear to auscultation, no crackles or wheezes. Heart:     Normal rate and regular rhythm. S1 and S2 normal without gallop, murmur, click, rub or other extra sounds. Skin:     Intact without suspicious lesions or rashes Cervical Nodes:     No lymphadenopathy noted Psych:     normal affect, talkative and pleasant     Impression & Recommendations:  Problem # 1:  ALLERGIC RHINITIS (ICD-477.9) Assessment: Deteriorated failed claritin and zyrtec- with sneezing and clear rhinorrhea will try flonase and allegra and update if increased ha or  any fever - update  Her updated medication list for this problem includes:    Flonase 50 Mcg/act Susp (Fluticasone propionate) .Marland Kitchen... 2 sprays in each nostril daily    Allegra 180 Mg Tabs (Fexofenadine hcl) .Marland Kitchen... 1 by mouth once daily as needed allergies   Problem # 2:  FOREIGN BODY IN EAR (ICD-931) Assessment: New mosquito/bug in ear - too far to retreive here (no signs of infection) ref to ENT for removal   Complete Medication List: 1)  Flexeril 10 Mg Tabs (Cyclobenzaprine hcl) .... Take one by mouth q hs prn 2)  Cenestin 1.25 Mg Tabs (Estrogens conj synthetic a) 3)  Synthroid 75 Mcg Tabs (Levothyroxine sodium) 4)  Zithromax Z-pak 250 Mg Tabs (Azithromycin) .... Use as directed 5)  Tessalon Perles 100 Mg Caps (Benzonatate) .Marland Kitchen.. 1 by mouth three times a day swallow whole for cough prn 6)  Flonase 50 Mcg/act Susp (Fluticasone propionate) .... 2 sprays in each nostril daily 7)  Allegra 180 Mg Tabs (Fexofenadine hcl) .Marland Kitchen.. 1 by mouth once daily as  needed allergies   Patient Instructions: 1)  we will refer you to ENT specialist to get insect out of your ear 2)  start allegra and flonase for allergies 3)  if symptoms are not improved - or if increased facial pain or any fever please update me (within a week) 4)  do not take any decongestants over the counter- can affect blood pressure 5)  follow up in 25month- with me for blood pressure   Prescriptions: ALLEGRA 180 MG  TABS (FEXOFENADINE HCL) 1 by mouth once daily as needed allergies  #30 x 5   Entered and Authorized by:   Judith Part MD   Signed by:   Judith Part MD on 01/25/2008   Method used:   Print then Give to Patient   RxID:   [id number] FLONASE 50 MCG/ACT  SUSP (FLUTICASONE PROPIONATE) 2 sprays in each nostril daily  #1 mdi x 5   Entered and Authorized by:   Judith Part MD   Signed by:   Judith Part MD on 01/25/2008   Method used:   Print then Give to Patient   RxID:   [id number] FLEXERIL 10 MG  TABS (CYCLOBENZAPRINE HCL) take one by mouth q hs prn  #30 x 0   Entered and Authorized by:   Judith Part MD   Signed by:   Judith Part MD on 01/25/2008   Method used:   Print then Give to Patient   RxID:   [id number]  ]

## 2010-09-25 NOTE — Progress Notes (Signed)
Summary: fyi  Phone Note Call from Patient Call back at Home Phone [phone]   Caller: Patient Call For: Elysabeth Aust Summary of Call: fyi pt filled rx she got yesterday Initial call taken by: Liane Comber,  July 02, 2007 2:16 PM  Follow-up for Phone Call        noted ..................................................................Marland KitchenBillie-Lynn Tyler Deis FNP  July 02, 2007 4:50 PM

## 2010-09-25 NOTE — Letter (Signed)
Summary: Generic Letter  [place] at [facility]  [address] [place], [place] [address]   Phone: [phone]  Fax: [phone]    04/11/2008    [facility] [address] [place], [place]  [address]     Dear Linda [place],  Your mammogram was normal, please repeat screening in one year.   Sincerely,   [place] at [facility]

## 2010-09-25 NOTE — Progress Notes (Signed)
Summary: Cenestin and Synthroid  Phone Note Refill Request Message from:  Scriptline on April 03, 2009 12:13 PM  Refills Requested: Medication #1:  CENESTIN 1.25 MG TABS Take one by mouth daily  Medication #2:  SYNTHROID 75 MCG TABS Take one by mouth daily [BMN] Wasn't sure how many to RF.  Looks like pt. may need an OV or CPX? [facility] [place]  Phone:   [phone]   Method Requested: Electronic Initial call taken by: Delilah Shan CMA,  April 03, 2009 12:13 PM  Follow-up for Phone Call        can refil each 1 mo with 1 ref and schedule f/u with me when able Follow-up by: Judith Part MD,  April 03, 2009 1:32 PM  Additional Follow-up for Phone Call Additional follow up Details #1::        Patient Advised.  Additional Follow-up by: Delilah Shan CMA,  April 03, 2009 2:28 PM    New/Updated Medications: SYNTHROID 75 MCG TABS (LEVOTHYROXINE SODIUM) Take one by mouth daily [BMN] Prescriptions: SYNTHROID 75 MCG TABS (LEVOTHYROXINE SODIUM) Take one by mouth daily Brand medically necessary #30 x 1   Entered and Authorized by:   Judith Part MD   Signed by:   Delilah Shan CMA on 04/03/2009   Method used:   Telephoned to ...       [facility] (retail)       [address]       [place], [place]  [address]       Ph: [phone]       Fax: [phone]   RxID:   [id number] CENESTIN 1.25 MG TABS (ESTROGENS CONJ SYNTHETIC A) Take one by mouth daily  #30 x 1   Entered and Authorized by:   Judith Part MD   Signed by:   Delilah Shan CMA on 04/03/2009   Method used:   Telephoned to ...       [facility] (retail)       [address]       [place], [place]  [address]       Ph: [phone]       Fax: [phone]   RxID:   [id number]

## 2010-09-25 NOTE — Assessment & Plan Note (Signed)
Summary: F/U GALLBLADDER/CLE   Vital Signs:  Patient profile:   77 year old female Weight:      204 pounds Temp:     97.7 degrees F oral Pulse rate:   64 / minute Pulse rhythm:   regular BP sitting:   160 / 94  (left arm) Cuff size:   large  Vitals Entered By: Lowella Petties CMA (September 04, 2009 10:33 AM) CC: Follow up  with gallbladder problems   History of Present Illness: here for f/u of abd pain   does not feel good at all  pain behind shoulder blade is still intense -- pain radiates all over  a lot of gas in general and bloating  takes bra off in middle of day no rash   no help at all with zantac   no nausea   thinks she needs to see a surgeon  ultrasound found one small mobile gallstone with no signs of obstruction   it does not matter what she eats  is intermittent and occ severe  no fever  no change with position or movement   had nl labs incl liver and then [place] of abd  one 9 mm stone in gallbladder- mobile   Allergies: 1)  ! Pcn 2)  ! Codeine 3)  ! Amoxicillin 4)  ! Tetracycline 5)  ! Darvon 6)  ! * Shellfish 7)  ! Norvasc (Amlodipine Besylate) 8)  ! Cipro  Past History:  Past Medical History: Last updated: 04/20/2009 Allergic rhinitis Osteoarthritis leg cramps  fibromyalgia  HTN  hypothyroid  hyperlipidemia  menopausal syndrome   Past Surgical History: Last updated: 08/24/2008 colonoscopy 5/09 small polyp --- re check in 5 years  Hysterectomy- total, fibroids Breast biopsy- benign Rotator cuff repair Skin lesion- pre melanoma Abd [place]- neg (11/1998) Abd US- gallbladder polyps (01/2001) Hida scan- neg (01/2001) Colonoscpy- diverticulosis, polyp (1998) Dexa- osteopenia (10/2001) EGD (2004) Cataracts Colonoscopy- diverticulosis, hem (11/2002) Right     surgery-orthoscopic for tear  (04/206) Colonoscopy- diverticulosis, polyp (5/09)  Family History: Last updated: 03/29/2008 Father: parkinsons  Mother: allergies Siblings:  brother with colon cancer son with kidney failure (congenital)  Social History: Last updated: 03/29/2008 non smoker occ alcohol married-husband does the cooking  works outside home   Risk Factors: Smoking Status: never (01/25/2008)  Review of Systems General:  Complains of fatigue; denies chills, fever, loss of appetite, and malaise. Eyes:  Denies blurring. ENT:  Denies sore throat. CV:  Denies chest pain or discomfort, palpitations, and shortness of breath with exertion. Resp:  Denies cough and wheezing. GI:  Complains of abdominal pain and gas; denies bloody stools, change in bowel habits, dark tarry stools, diarrhea, indigestion, nausea, and vomiting. GU:  Denies dysuria, hematuria, and urinary frequency. MS:  Complains of mid back pain; denies joint pain, joint redness, joint swelling, cramps, and muscle weakness. Derm:  Denies lesion(s), poor wound healing, and rash. Neuro:  Denies numbness and tingling. Heme:  Denies abnormal bruising and bleeding.  Physical Exam  General:  overweight but generally well appearing  Head:  normocephalic, atraumatic, and no abnormalities observed.   Eyes:  vision grossly intact, pupils equal, pupils round, and pupils reactive to light.  no conjunctival pallor, injection or icterus  Neck:  supple with full rom and no masses or thyromegally, no JVD or carotid bruit  Lungs:  Normal respiratory effort, chest expands symmetrically. Lungs are clear to auscultation, no crackles or wheezes. Heart:  Normal rate and regular rhythm. S1 and S2 normal  without gallop, murmur, click, rub or other extra sounds. Abdomen:  tender mildly in RUQ without rebound or gaurding or murphy sign  soft, normal bowel sounds, no distention, no masses, no hepatomegaly, and no splenomegaly.   Msk:  no CVA tenderness  no TS or LS tenderness mild thoracic muscular tenderness on R  nl rom RUE -- some back pain when fully abducted however   Extremities:  No clubbing,  cyanosis, edema, or deformity noted with normal full range of motion of all joints.   Neurologic:  sensation intact to light touch, gait normal, and DTRs symmetrical and normal.   Skin:  Intact without suspicious lesions or rashes Cervical Nodes:  No lymphadenopathy noted Inguinal Nodes:  No significant adenopathy Psych:  normal affect, talkative and pleasant    Impression & Recommendations:  Problem # 1:  CHOLELITHIASIS (ICD-574.20) Assessment New one mobile gallstone- pt continues to have abd pain and bloating (no imp with H2 blocker) ref to gen surgeon for further eval  update if symptoms worsen in meantime Orders: Surgical Referral (Surgery)  Problem # 2:  BACK PAIN (ICD-724.5) Assessment: Deteriorated intermittent R sided back pain with some muscluar tenderness and no neurol /radicular sympt  ? if related to gallstone check TS x ray and update Her updated medication list for this problem includes:    Flexeril 10 Mg Tabs (Cyclobenzaprine hcl) .Marland Kitchen... Take one by mouth at bedtime as needed  Orders: Radiology Referral (Radiology)  Complete Medication List: 1)  Flexeril 10 Mg Tabs (Cyclobenzaprine hcl) .... Take one by mouth at bedtime as needed 2)  Cenestin 1.25 Mg Tabs (Estrogens conj synthetic a) .... Take one by mouth daily 3)  Synthroid 75 Mcg Tabs (Levothyroxine sodium) .... Take one by mouth daily 4)  Flonase 50 Mcg/act Susp (Fluticasone propionate) .... 2 sprays in each nostril daily 5)  Qualaquin 324 Mg Caps (Quinine sulfate) .... Take 2 by mouth three times a day as needed 6)  Calcium 1000 Mg With Mag 250 Plus Vit D  .... 1 pill daily 7)  Mvi  .Marland Kitchen.. 1  by mouth once daily 8)  Vitamin C 1000 Mg Tabs (Ascorbic acid) .... Take one by mouth daily 9)  Vitamin D 1000 Unit Tabs (Cholecalciferol) .... 4,000 units daily  Patient Instructions: 1)  we will refer you for x ray at check out  2)  we will refer you for surgeon visit at check out  3)  update me if pain wosens- try  warm compress on back when it hurts 4)  avoid fatty foods in diet   Prior Medications (reviewed today): FLEXERIL 10 MG  TABS (CYCLOBENZAPRINE HCL) take one by mouth at bedtime as needed CENESTIN 1.25 MG TABS (ESTROGENS CONJ SYNTHETIC A) Take one by mouth daily SYNTHROID 75 MCG TABS (LEVOTHYROXINE SODIUM) Take one by mouth daily FLONASE 50 MCG/ACT  SUSP (FLUTICASONE PROPIONATE) 2 sprays in each nostril daily QUALAQUIN 324 MG  CAPS (QUININE SULFATE) Take 2 by mouth three times a day as needed CALCIUM 1000 MG WITH MAG 250 PLUS VIT D () 1 pill daily MVI () 1  by mouth once daily VITAMIN C 1000 MG TABS (ASCORBIC ACID) Take one by mouth daily VITAMIN D 1000 UNIT TABS (CHOLECALCIFEROL) 4,000 units daily Current Allergies: ! PCN ! CODEINE ! AMOXICILLIN ! TETRACYCLINE ! DARVON ! * SHELLFISH ! NORVASC (AMLODIPINE BESYLATE) ! CIPRO

## 2010-09-25 NOTE — Assessment & Plan Note (Signed)
Summary: ABD , LOWER BACK PAIN, AND NAUSEA  CYD   Vital Signs:  Patient profile:   77 year old female Weight:      205 pounds Temp:     97.9 degrees F oral Pulse rate:   80 / minute Pulse rhythm:   regular BP sitting:   162 / 90  (left arm) Cuff size:   large  Vitals Entered By: Lowella Petties CMA (July 12, 2009 9:05 AM) CC: All around abdominal pain x 3 days   History of Present Illness: having abd distress started mon nt -- full in her stomach with bloating and pressure  then nausea without vomiting then pressure in abd and back maalox and gas x -- no help  up all nt in misery 2 BM in night- relatively nl / no blood  final one in am  a little better mon am  yesterday- worked a short day -- feeling very tired and generally sick cannot concentrate / low energy   is worried about poss of gallstones  bra bothered her  ate soup and tea yesterday  cereal and coffee this am with her pills   not as bad as she was pain is mostly in lower abdomen -- rad to her back , some on R pressure under R arm -- also sore to the touch  lot of gas and bloating  no more nausea and no vomiting   has hx of diverticulosis   no sick contacts  no urine symptoms at all   no fever or chills    Allergies: 1)  ! Pcn 2)  ! Codeine 3)  ! Amoxicillin 4)  ! Tetracycline 5)  ! Darvon 6)  ! * Shellfish 7)  ! Norvasc (Amlodipine Besylate) 8)  ! Cipro  Past History:  Past Medical History: Last updated: 04/20/2009 Allergic rhinitis Osteoarthritis leg cramps  fibromyalgia  HTN  hypothyroid  hyperlipidemia  menopausal syndrome   Past Surgical History: Last updated: 08/24/2008 colonoscopy 5/09 small polyp --- re check in 5 years  Hysterectomy- total, fibroids Breast biopsy- benign Rotator cuff repair Skin lesion- pre melanoma Abd [place]- neg (11/1998) Abd US- gallbladder polyps (01/2001) Hida scan- neg (01/2001) Colonoscpy- diverticulosis, polyp (1998) Dexa- osteopenia  (10/2001) EGD (2004) Cataracts Colonoscopy- diverticulosis, hem (11/2002) Right     surgery-orthoscopic for tear  (04/206) Colonoscopy- diverticulosis, polyp (5/09)  Family History: Last updated: 03/29/2008 Father: parkinsons  Mother: allergies Siblings: brother with colon cancer son with kidney failure (congenital)  Social History: Last updated: 03/29/2008 non smoker occ alcohol married-husband does the cooking  works outside home   Risk Factors: Smoking Status: never (01/25/2008)  Review of Systems General:  Complains of fatigue; denies chills, fever, loss of appetite, and malaise. Eyes:  Denies blurring. ENT:  Denies sore throat. CV:  Denies chest pain or discomfort and palpitations. Resp:  Denies cough and wheezing. GI:  Denies bloody stools, hemorrhoids, and indigestion. MS:  Denies joint pain and muscle aches. Derm:  Denies lesion(s), poor wound healing, and rash. Neuro:  Denies headaches and sensation of room spinning. Psych:  mood is fine. Endo:  Denies excessive thirst and excessive urination. Heme:  Denies abnormal bruising and bleeding.  Physical Exam  General:  overweight but generally well appearing  Head:  normocephalic, atraumatic, and no abnormalities observed.   Mouth:  Oral mucosa and oropharynx without lesions or exudates.  Teeth in good repair.pharynx pink and moist.   Neck:  supple with full rom and no masses or thyromegally,  no JVD or carotid bruit  Lungs:  Normal respiratory effort, chest expands symmetrically. Lungs are clear to auscultation, no crackles or wheezes. Heart:  Normal rate and regular rhythm. S1 and S2 normal without gallop, murmur, click, rub or other extra sounds. Abdomen:  tender abd diffusely - no rebound or gaurding  worse in RUQ and LLQ neg murphys sign nl bs 4 Q soft, no distention, no masses, no hepatomegaly, and no splenomegaly.   Msk:  no CVA tenderness  Extremities:  No clubbing, cyanosis, edema, or deformity noted  with normal full range of motion of all joints.   Skin:  Intact without suspicious lesions or rashes no pallor or jaundice Cervical Nodes:  No lymphadenopathy noted Inguinal Nodes:  No significant adenopathy Psych:  normal affect, talkative and pleasant    Impression & Recommendations:  Problem # 1:  ABDOMINAL PAIN, GENERALIZED (ICD-789.07) Assessment New with nausea and no stool changes  disc poss etiol incl viral gastroenteritis, gallstones, diverticulitis lab incl wbc , amy, lipase sched abd [place]  ua normal  adv to try zantac for acid -- and update if sympt worsen while waiting for test results Orders: Venipuncture (63875) TLB-BMP (Basic Metabolic Panel-BMET) (80048-METABOL) TLB-Hepatic/Liver Function Pnl (80076-HEPATIC) TLB-CBC Platelet - w/Differential (85025-CBCD) TLB-Amylase (82150-AMYL) TLB-Lipase (83690-LIPASE) Radiology Referral (Radiology)  Complete Medication List: 1)  Flexeril 10 Mg Tabs (Cyclobenzaprine hcl) .... Take one by mouth at bedtime as needed 2)  Cenestin 1.25 Mg Tabs (Estrogens conj synthetic a) .... Take one by mouth daily 3)  Synthroid 75 Mcg Tabs (Levothyroxine sodium) .... Take one by mouth daily 4)  Flonase 50 Mcg/act Susp (Fluticasone propionate) .... 2 sprays in each nostril daily 5)  Qualaquin 324 Mg Caps (Quinine sulfate) .... Take 2 by mouth three times a day as needed 6)  Calcium 1000 Mg With Mag 250 Plus Vit D  .... 1 pill daily 7)  Mvi  .Marland Kitchen.. 1  by mouth once daily 8)  Vitamin C 1000 Mg Tabs (Ascorbic acid) .... Take one by mouth daily 9)  Vitamin D 1000 Unit Tabs (Cholecalciferol) .... 4,000 units daily  Patient Instructions: 1)  we will schedule abdominal ultrasound at check out  2)  labs today-- I will update you  3)  if symptoms worsen or if fever/ vomiting- etc -- please update me  4)  you can try zantac over the counter for acid  5)  urine test on your way out - I will update you  Prior Medications (reviewed today): FLEXERIL 10 MG   TABS (CYCLOBENZAPRINE HCL) take one by mouth at bedtime as needed CENESTIN 1.25 MG TABS (ESTROGENS CONJ SYNTHETIC A) Take one by mouth daily SYNTHROID 75 MCG TABS (LEVOTHYROXINE SODIUM) Take one by mouth daily FLONASE 50 MCG/ACT  SUSP (FLUTICASONE PROPIONATE) 2 sprays in each nostril daily QUALAQUIN 324 MG  CAPS (QUININE SULFATE) Take 2 by mouth three times a day as needed CALCIUM 1000 MG WITH MAG 250 PLUS VIT D () 1 pill daily MVI () 1  by mouth once daily VITAMIN C 1000 MG TABS (ASCORBIC ACID) Take one by mouth daily VITAMIN D 1000 UNIT TABS (CHOLECALCIFEROL) 4,000 units daily Current Allergies: ! PCN ! CODEINE ! AMOXICILLIN ! TETRACYCLINE ! DARVON ! * SHELLFISH ! NORVASC (AMLODIPINE BESYLATE) ! CIPRO  Laboratory Results   Urine Tests   Date/Time Reported: July 12, 2009 1:21 PM   Routine Urinalysis   Color: yellow Appearance: Clear Glucose: negative   (Normal Range: Negative) Bilirubin: negative   (Normal  Range: Negative) Ketone: negative   (Normal Range: Negative) Spec. Gravity: >=1.030   (Normal Range: 1.003-1.035) Blood: negative   (Normal Range: Negative) pH: 7.5   (Normal Range: 5.0-8.0) Protein: negative   (Normal Range: Negative) Urobilinogen: 0.2   (Normal Range: 0-1) Nitrite: negative   (Normal Range: Negative) Leukocyte Esterace: negative   (Normal Range: Negative)

## 2010-09-25 NOTE — Progress Notes (Signed)
Summary: refill request for flexeril  Phone Note Refill Request Message from:  Scriptline  Refills Requested: Medication #1:  FLEXERIL 10 MG  TABS take one by mouth at bedtime as needed   Last Refilled: 07/10/2009 [facility]  Initial call taken by: Lowella Petties CMA,  August 31, 2009 10:16 AM  Follow-up for Phone Call        px written on EMR for call in  Follow-up by: Judith Part MD,  August 31, 2009 11:49 AM    Prescriptions: FLEXERIL 10 MG  TABS (CYCLOBENZAPRINE HCL) take one by mouth at bedtime as needed  #30 x 1   Entered by:   Lowella Petties CMA   Authorized by:   Judith Part MD   Signed by:   Lowella Petties CMA on 08/31/2009   Method used:   Electronically to        [facility] (retail)       [address]       [place], [place]  [address]       Ph: [phone]       Fax: [phone]   RxID:   [id number]

## 2010-09-25 NOTE — Assessment & Plan Note (Signed)
Summary: poison ivey/dlo   Vital Signs:  Patient profile:   77 year old female Weight:      203.25 pounds Temp:     98.2 degrees F oral Pulse rate:   72 / minute Pulse rhythm:   regular BP sitting:   138 / 84  (left arm) Cuff size:   large  Vitals Entered By: Sydell Axon LPN (February 13, 2010 12:05 PM) CC: Poison ivy on both arms and hands   History of Present Illness: Pt here for erythem fairly coalescent rash of the dorsum of the hands anddistal wrists bilat, R>>>L. She gets poison ivy bad and suspects wood mulch she spread Sat had poison ivy debris in it. She has been irritated topically since that night. She has been using Gold Bond and taking Benadryl. She otherwise feels well. She Problems Prior to Update: 1)  Hand Pain  (ICD-729.5) 2)  Other Specified Disorder of Skin  (ICD-709.8) 3)  Hypothyroidism  (ICD-244.9) 4)  Unspecified Vitamin D Deficiency  (ICD-268.9) 5)  Hyperlipidemia  (ICD-272.4) 6)  Menopausal Syndrome  (ICD-627.2) 7)  Osteopenia  (ICD-733.90) 8)  Other Screening Mammogram  (ICD-V76.12) 9)  Hypertension  (ICD-401.9) 10)  Fibromyalgia  (ICD-729.1) 11)  Ibs  (ICD-564.1) 12)  Osteoarthritis  (ICD-715.90) 13)  Allergic Rhinitis  (ICD-477.9)  Medications Prior to Update: 1)  Flexeril 10 Mg  Tabs (Cyclobenzaprine Hcl) .... Take One By Mouth At Bedtime As Needed 2)  Cenestin 1.25 Mg Tabs (Estrogens Conj Synthetic A) .... Take One By Mouth Daily 3)  Synthroid 75 Mcg Tabs (Levothyroxine Sodium) .... Take One By Mouth Daily 4)  Flonase 50 Mcg/act  Susp (Fluticasone Propionate) .... 2 Sprays in Each Nostril Daily As Needed 5)  Qualaquin 324 Mg  Caps (Quinine Sulfate) .... Take 2 By Mouth Three Times A Day As Needed 6)  Calcium 1000 Mg With Mag 250 Plus Vit D .... 1 Pill Daily 7)  Mvi .Marland Kitchen.. 1  By Mouth Once Daily 8)  Vitamin C 1000 Mg Tabs (Ascorbic Acid) .... Take One By Mouth Daily 9)  Vitamin D 1000 Unit Tabs (Cholecalciferol) .... 4,000 Units Daily 10)  Fish  Oil   Oil (Fish Oil) .... Take One By Mouth Daily  Allergies: 1)  ! Pcn 2)  ! Codeine 3)  ! Amoxicillin 4)  ! Tetracycline 5)  ! Darvon 6)  ! * Shellfish 7)  ! Norvasc (Amlodipine Besylate) 8)  ! Cipro  Physical Exam  General:  overweight but generally well appearing  Head:  normocephalic, atraumatic, and no abnormalities observed.   Eyes:  Conjunctiva clear bilaterally.  Skin:  erythematouis coalescent mildly linear and vessicular in the noncoalescent areas of the dorsum of the hands and the entire disatl wrists bialt, the right much miore involved that the left.   Impression & Recommendations:  Problem # 1:  RHUS DERMATITIS (ICD-692.6) Assessment New Apply steroid cream two times a day, incl today. Take Claritin, Allegra or Zyrtec in AM, Benadryl at night. May cont Gold bond. Her updated medication list for this problem includes:    Clobetasol Propionate 0.05 % Crea (Clobetasol propionate) .Marland Kitchen... Apply to area two times a day  Complete Medication List: 1)  Flexeril 10 Mg Tabs (Cyclobenzaprine hcl) .... Take one by mouth at bedtime as needed 2)  Cenestin 1.25 Mg Tabs (Estrogens conj synthetic a) .... Take one by mouth daily 3)  Synthroid 75 Mcg Tabs (Levothyroxine sodium) .... Take one by mouth daily 4)  Flonase 50 Mcg/act  Susp (Fluticasone propionate) .... 2 sprays in each nostril daily as needed 5)  Qualaquin 324 Mg Caps (Quinine sulfate) .... Take 2 by mouth three times a day as needed 6)  Calcium 1000 Mg With Mag 250 Plus Vit D  .... 1 pill daily 7)  Mvi  .Marland Kitchen.. 1  by mouth once daily 8)  Vitamin C 1000 Mg Tabs (Ascorbic acid) .... Take one by mouth daily 9)  Vitamin D 1000 Unit Tabs (Cholecalciferol) .... 4,000 units daily 10)  Fish Oil Oil (Fish oil) .... Take one by mouth daily 11)  Clobetasol Propionate 0.05 % Crea (Clobetasol propionate) .... Apply to area two times a day  Patient Instructions: 1)  RTC if not improved in 3-4 days. 2)  Trying to avoid  steroids. Prescriptions: CLOBETASOL PROPIONATE 0.05 % CREA (CLOBETASOL PROPIONATE) apply to area two times a day  #60 gms x 0   Entered and Authorized by:   Shaune Leeks MD   Signed by:   Shaune Leeks MD on 02/13/2010   Method used:   Electronically to        [facility] (retail)       [address]       [place], [place]  [address]       Ph: [phone]       Fax: [phone]   RxID:   [id number]   Current Allergies (reviewed today): ! PCN ! CODEINE ! AMOXICILLIN ! TETRACYCLINE ! DARVON ! * SHELLFISH ! NORVASC (AMLODIPINE BESYLATE) ! CIPRO

## 2010-09-25 NOTE — Progress Notes (Signed)
Summary: flexeril refill  Phone Note Refill Request Message from:  [employer] on November 07, 2008 10:39 AM  Refills Requested: Medication #1:  FLEXERIL 10 MG  TABS take one by mouth at bedtime as needed   Last Refilled: 10/07/2008 [facility]  Initial call taken by: Liane Comber,  November 07, 2008 10:39 AM  Follow-up for Phone Call        px written on EMR for call in  Follow-up by: Judith Part MD,  November 07, 2008 11:08 AM  Additional Follow-up for Phone Call Additional follow up Details #1::        Rx faxed to pharmacy Additional Follow-up by: Liane Comber,  November 07, 2008 12:06 PM    New/Updated Medications: FLEXERIL 10 MG  TABS (CYCLOBENZAPRINE HCL) take one by mouth at bedtime as needed   Prescriptions: FLEXERIL 10 MG  TABS (CYCLOBENZAPRINE HCL) take one by mouth at bedtime as needed  #30 x 3   Entered and Authorized by:   Judith Part MD   Signed by:   Liane Comber on 11/07/2008   Method used:   Telephoned to ...       [facility] (retail)       [address]       [place], [place]  [address]       Ph: [phone]       Fax: [phone]   RxID:   [id number]

## 2010-09-25 NOTE — Miscellaneous (Signed)
Summary[facility]  Clinical Lists Changes  Observations: Added new observation of FLU VAX: Fluvax 3+ (04/29/2009 8:32)      Influenza Immunization History:    Influenza # 1:  Fluvax 3+ (04/29/2009) Pt [facility] at [facility].    Lowella Petties CMA  May 04, 2009 8:33 AM

## 2010-09-25 NOTE — Assessment & Plan Note (Signed)
Summary: CHECK UP/CLE   Vital Signs:  Patient Profile:   77 Years Old Female Weight:      203 pounds Temp:     97.8 degrees F oral Pulse rate:   64 / minute Pulse rhythm:   regular BP sitting:   140 / 96  (left arm) Cuff size:   large  Vitals Entered By: Liane Comber (March 29, 2008 9:29 AM)                 Chief Complaint:  cpx.  History of Present Illness: on bp  at home -- 137/77 this am bp swelled her up in legs-- and woke her up at night  didn't call to update me , however gets as much exercise as she can with leg pain is doing house work and gardening and works outside the home  knows she needs to get wt off problems with eating -- husband cooks too much for her   some Leticia Penna has kidney failure - on kidney wt list   is taking her ca and vit D -and also magnesium   has never had shingles vaccine   in past total hyst-- no cancers  no gyn probs or symptoms   needs est refilled  she thinks that the risks are less than the benefits she is totally incapacitated by menupausal symptoms-- and she cannot live without it  understands vascular and cancer risks         Current Allergies: ! PCN ! CODEINE ! AMOXICILLIN ! TETRACYCLINE ! DARVON ! * SHELLFISH ! NORVASC (AMLODIPINE BESYLATE)  Past Medical History:    Allergic rhinitis    Osteoarthritis    leg cramps     fibromyalgia     HTN   Past Surgical History:    Reviewed history from 01/25/2008 and no changes required:       colonoscopy 5/09 small polyp --- re check in 5 years        Hysterectomy- total, fibroids       Breast biopsy- benign       Rotator cuff repair       Skin lesion- pre melanoma       Abd [place]- neg (11/1998)       Abd US- gallbladder polyps (01/2001)       Hida scan- neg (01/2001)       Colonoscpy- diverticulosis, polyp (1998)       Dexa- osteopenia (10/2001)       EGD (2004)       Cataracts       Colonoscopy- diverticulosis, hem (11/2002)       Right      surgery-orthoscopic for tear  (04/206)   Family History:    Reviewed history from 03/07/2008 and no changes required:       Father: parkinsons        Mother: allergies       Siblings: brother with colon cancer       son with kidney failure (congenital)  Social History:    Reviewed history from 01/25/2008 and no changes required:       non smoker       occ alcohol       married-husband does the cooking        works outside home     Review of Systems  General      Complains of fatigue.      Denies chills, fever, loss of appetite, malaise, and weight loss.  Eyes  Denies blurring and eye pain.  CV      Denies chest pain or discomfort, palpitations, and shortness of breath with exertion.  Resp      Denies cough, shortness of breath, and wheezing.  GI      Denies abdominal pain, bloody stools, and change in bowel habits.  GU      Denies discharge, dysuria, and urinary frequency.  MS      Complains of joint pain and stiffness.      Denies joint redness and joint swelling.  Derm      Denies itching, lesion(s), and rash.  Neuro      Denies numbness, tingling, and weakness.  Psych      mood has been fairly stable   Endo      Denies excessive thirst and excessive urination.   Physical Exam  General:     overweight but generally well appearing  Head:     normocephalic, atraumatic, and no abnormalities observed.   Eyes:     vision grossly intact, pupils equal, pupils round, and pupils reactive to light.   Ears:     R ear normal and L ear normal.   Nose:     no nasal discharge.   Mouth:     pharynx pink and moist.   Neck:     supple with full rom and no masses or thyromegally, no JVD or carotid bruit  Chest Wall:     No deformities, masses, or tenderness noted. Breasts:     No mass, nodules, thickening, tenderness, bulging, retraction, inflamation, nipple discharge or skin changes noted.   Lungs:     Normal respiratory effort, chest expands  symmetrically. Lungs are clear to auscultation, no crackles or wheezes. Heart:     Normal rate and regular rhythm. S1 and S2 normal without gallop, murmur, click, rub or other extra sounds. Abdomen:     Bowel sounds positive,abdomen soft and non-tender without masses, organomegaly or hernias noted.  no renal bruits  Msk:     No deformity or scoliosis noted of thoracic or lumbar spine.  no acute joint changes  Pulses:     R and L carotid,radial,femoral,dorsalis pedis and posterior tibial pulses are full and equal bilaterally Extremities:     trace pedal edema Neurologic:     sensation intact to light touch, gait normal, and DTRs symmetrical and normal.   Skin:     Intact without suspicious lesions or rashes some solar lentigos and few SKs Cervical Nodes:     No lymphadenopathy noted Axillary Nodes:     No palpable lymphadenopathy Inguinal Nodes:     No significant adenopathy Psych:     normal affect, talkative and pleasant     Impression & Recommendations:  Problem # 1:  OTHER SCREENING MAMMOGRAM (ICD-V76.12) Assessment: Comment Only sched annual mam pt aware of inc breast ca risk with hrt encouraged continued self exams Orders: Radiology Referral (Radiology)   Problem # 2:  HYPERTENSION (ICD-401.9) Assessment: Unchanged pt wants to hold off on med - until she looses 10 lb  will f/u 3 mo disc opt diet and exercise for wt loss The following medications were removed from the medication list:    Norvasc 5 Mg Tabs (Amlodipine besylate) .Marland Kitchen... 1 by mouth once daily in am  Orders: Venipuncture ([address]) TLB-Lipid Panel (80061-LIPID) TLB-Renal Function Panel (80069-RENAL) TLB-Hepatic/Liver Function Pnl (80076-HEPATIC) TLB-TSH (Thyroid Stimulating Hormone) (84443-TSH)  BP today: 140/96 Prior BP: 150/88 (03/07/2008)   Problem # 3:  FIBROMYALGIA (ICD-729.1) Assessment: Unchanged overall stable  pt stays as active as she can uses flexeril as needed with caution Her  updated medication list for this problem includes:    Flexeril 10 Mg Tabs (Cyclobenzaprine hcl) .Marland Kitchen... Take one by mouth q hs prn   Problem # 4:  Preventive Health Care (ICD-V70.0) zostavax today breast exam done   Problem # 5:  OSTEOPENIA (ICD-733.90) Assessment: Unchanged rev ca and vit D req check vit D level  sched dexa  Orders: T-Vitamin D (25-Hydroxy) (64403-47425) Radiology Referral (Radiology)   Problem # 6:  MENOPAUSAL SYNDROME (ICD-627.2) Assessment: Unchanged pt continues to have intolerant vasomotor symptoms when off HRT disc risks of ongoing HRT including blood clot/vascular dz, breast ca  pt choose to take risks and continue estrogen for her symptoms  Her updated medication list for this problem includes:    Cenestin 1.25 Mg Tabs (Estrogens conj synthetic a) .Marland Kitchen... Take one by mouth daily   Complete Medication List: 1)  Flexeril 10 Mg Tabs (Cyclobenzaprine hcl) .... Take one by mouth q hs prn 2)  Cenestin 1.25 Mg Tabs (Estrogens conj synthetic a) .... Take one by mouth daily 3)  Synthroid 75 Mcg Tabs (Levothyroxine sodium) .... Take one by mouth daily 4)  Flonase 50 Mcg/act Susp (Fluticasone propionate) .... 2 sprays in each nostril daily 5)  Allegra 180 Mg Tabs (Fexofenadine hcl) .Marland Kitchen.. 1 by mouth once daily as needed allergies 6)  Qualaquin 324 Mg Caps (Quinine sulfate) .... Take 2 by mouth three times a day as needed 7)  Calcium 1000 Mg With Mag 250 Plus Vit D  .... 1 pill daily 8)  Mvi  .Marland Kitchen.. 1  by mouth once daily 9)  Vitamin D 1000 Iu  .Marland Kitchen.. 1 pill daily  Other Orders: Zoster (Shingles) Vaccine Live [phone]) Admin 1st Vaccine ([address])   Patient Instructions: 1)  the current recommendation for calcium intake is 1200-1500 mg daily with [phone] IU of vitamin D 2)  we will set up your mammogram and dexa at check out 3)  work on a 10 lb weight loss with diet and exercise 4)  follow up in 3 months    Prescriptions: ALLEGRA 180 MG  TABS (FEXOFENADINE HCL) 1  by mouth once daily as needed allergies  #30 x 11   Entered and Authorized by:   Judith Part MD   Signed by:   Judith Part MD on 03/29/2008   Method used:   Print then Give to Patient   RxID:   [id number] FLONASE 50 MCG/ACT  SUSP (FLUTICASONE PROPIONATE) 2 sprays in each nostril daily  #1 month x 11   Entered and Authorized by:   Judith Part MD   Signed by:   Judith Part MD on 03/29/2008   Method used:   Print then Give to Patient   RxID:   [id number] SYNTHROID 75 MCG TABS (LEVOTHYROXINE SODIUM) Take one by mouth daily Brand medically necessary #30 x 11   Entered and Authorized by:   Judith Part MD   Signed by:   Judith Part MD on 03/29/2008   Method used:   Print then Give to Patient   RxID:   [id number] CENESTIN 1.25 MG TABS (ESTROGENS CONJ SYNTHETIC A) Take one by mouth daily  #30 x 11   Entered and Authorized by:   Judith Part MD   Signed by:   Judith Part MD on 03/29/2008   Method used:   Print  then Give to Patient   RxID:   [id number] FLEXERIL 10 MG  TABS (CYCLOBENZAPRINE HCL) take one by mouth q hs prn  #30 x 3   Entered and Authorized by:   Judith Part MD   Signed by:   Judith Part MD on 03/29/2008   Method used:   Print then Give to Patient   RxID:   [id number]  ]  Preventive Care Screening  Breast Exam:    Date:  03/29/2008    Results:  Normal  Last Pneumovax:    Date:  08/27/2003    Results:  given   Bone Density:    Date:  10/24/2001    Results:  abnormal std dev   Zostavax # 1    Vaccine Type: Zostavax    Site: left deltoid    Mfr: Merck    Dose: 0.5 ml    Route: [place]    Given by: Liane Comber    Exp. Date: 03/18/2009    Lot #: [id number]    VIS given: 06/07/05 given March 29, 2008.

## 2010-09-25 NOTE — Consult Note (Signed)
Summary: [facility]   Imported By: Lanelle Bal 10/05/2009 15:02:44  _____________________________________________________________________  External Attachment:    Type:   Image     Comment:   External Document

## 2010-09-25 NOTE — Progress Notes (Signed)
Summary: cipro on back order  Phone Note From Pharmacy   Caller: [facility] Call For: Linda Mcgrath  Summary of Call: You had prescribed cipro for the pt and the pharmacy says that is on back order. They are out of 250 and 500 mg's, they do have 750 mg's.  Please advise Initial call taken by: Lowella Petties,  August 09, 2008 9:37 AM  Follow-up for Phone Call        can use the 750mg  cipro 1 bid  x 7, new Rx to be se nt  Linda-Lynn Tyler Deis FNP  August 09, 2008 11:37 AM     New/Updated Medications: CIPRO 750 MG TABS (CIPROFLOXACIN HCL) 1 two times a day   Prescriptions: CIPRO 750 MG TABS (CIPROFLOXACIN HCL) 1 two times a day  #14 x 0   Entered and Authorized by:   Gildardo Griffes FNP   Signed by:   Gildardo Griffes FNP on 08/09/2008   Method used:   Electronically to        [facility] (retail)       [address]       [place], [place]  [address]       Ph: [phone]       Fax: [phone]   RxID:   [id number]

## 2010-09-25 NOTE — Progress Notes (Signed)
Summary: cough med  Phone Note From Pharmacy   [facility] Summary of Call: pt says tussionex is too expensive, wants something cheaper Initial call taken by: Lowella Petties,  July 13, 2007 2:36 PM  Follow-up for Phone Call        it looks like she is allergic to codiene- which eliminates some options will try tessalon pearles px written on EMR for call in  Follow-up by: Judith Part MD,  July 13, 2007 5:24 PM  Additional Follow-up for Phone Call Additional follow up Details #1::        [facility] Additional Follow-up by: Lowella Petties,  July 13, 2007 5:27 PM    New/Updated Medications: TESSALON PERLES 100 MG  CAPS (BENZONATATE) 1 by mouth three times a day swallow whole for cough prn   Prescriptions: TESSALON PERLES 100 MG  CAPS (BENZONATATE) 1 by mouth three times a day swallow whole for cough prn  #30 x 0   Entered and Authorized by:   Judith Part MD   Signed by:   Lowella Petties on 07/13/2007   Method used:   Telephoned to ...         RxID:   [id number]

## 2010-09-25 NOTE — Assessment & Plan Note (Signed)
Summary: FOLLOW UP VIT D/GALLBLADDER SUGERY/RBH   Vital Signs:  Patient profile:   77 year old female Height:      64.5 inches Weight:      203.25 pounds BMI:     34.47 Temp:     97.5 degrees F oral Pulse rate:   68 / minute Pulse rhythm:   regular BP sitting:   138 / 70  (left arm) Cuff size:   large  Vitals Entered By: Lewanda Rife LPN (Jan 02, 7828 2:12 PM) CC: F/u Vitamin D and GB surgery in 09/2009   History of Present Illness: GB surgery went well- was very easy for her all healed up and symptoms are gone  no more pain under shoulder blade   has a cyst - R 5th finger -- no trauma is close to the skin  wants to pop it   was digging in ground last week and hand was swollen  thumb joint is really painful - stiff  too  no carpal tunnel  no triggering or getting stuck   is taking 2000 international units of vit D daily  wants to re check that  has been outdoors which helps too   has been on current thyroid med for a while more ridges in nails - ? if this is related  is a little more tired  not cold like she used to be  lots of arthritis all over    Allergies: 1)  ! Pcn 2)  ! Codeine 3)  ! Amoxicillin 4)  ! Tetracycline 5)  ! Darvon 6)  ! * Shellfish 7)  ! Norvasc (Amlodipine Besylate) 8)  ! Cipro  Past History:  Past Medical History: Last updated: 04/20/2009 Allergic rhinitis Osteoarthritis leg cramps  fibromyalgia  HTN  hypothyroid  hyperlipidemia  menopausal syndrome   Past Surgical History: Last updated: 08/24/2008 colonoscopy 5/09 small polyp --- re check in 5 years  Hysterectomy- total, fibroids Breast biopsy- benign Rotator cuff repair Skin lesion- pre melanoma Abd [place]- neg (11/1998) Abd US- gallbladder polyps (01/2001) Hida scan- neg (01/2001) Colonoscpy- diverticulosis, polyp (1998) Dexa- osteopenia (10/2001) EGD (2004) Cataracts Colonoscopy- diverticulosis, hem (11/2002) Right     surgery-orthoscopic for tear   (04/206) Colonoscopy- diverticulosis, polyp (5/09)  Family History: Last updated: 03/29/2008 Father: parkinsons  Mother: allergies Siblings: brother with colon cancer son with kidney failure (congenital)  Social History: Last updated: 03/29/2008 non smoker occ alcohol married-husband does the cooking  works outside home   Risk Factors: Smoking Status: never (01/25/2008)  Review of Systems General:  Complains of fatigue; denies fever, loss of appetite, and malaise. Eyes:  Denies blurring and eye irritation. CV:  Denies chest pain or discomfort, palpitations, shortness of breath with exertion, and swelling of feet. Resp:  Denies cough and wheezing. GI:  Denies abdominal pain, change in bowel habits, indigestion, and nausea. GU:  Denies urinary frequency. MS:  Complains of joint pain and stiffness; denies joint redness, joint swelling, muscle aches, and muscle weakness. Derm:  Complains of lesion(s); denies itching, poor wound healing, and rash. Neuro:  Denies headaches, numbness, and tingling. Psych:  Denies anxiety and depression. Endo:  Denies cold intolerance, excessive thirst, excessive urination, and heat intolerance. Heme:  Denies abnormal bruising and bleeding.  Physical Exam  General:  overweight but generally well appearing  Head:  normocephalic, atraumatic, and no abnormalities observed.   Eyes:  vision grossly intact, pupils equal, pupils round, and pupils reactive to light.  no conjunctival pallor, injection or  icterus  Mouth:  pharynx pink and moist.   Neck:  supple with full rom and no masses or thyromegally, no JVD or carotid bruit  Chest Wall:  No deformities, masses, or tenderness noted. Lungs:  Normal respiratory effort, chest expands symmetrically. Lungs are clear to auscultation, no crackles or wheezes. Heart:  Normal rate and regular rhythm. S1 and S2 normal without gallop, murmur, click, rub or other extra sounds. Abdomen:  healed scars on abd from  laparoscopy nontender  Msk:  some joint changes of OA in both hands with mild tenderness of R first MCP  Pulses:  R and L carotid,radial,femoral,dorsalis pedis and posterior tibial pulses are full and equal bilaterally Extremities:  No clubbing, cyanosis, edema, or deformity noted with normal full range of motion of all joints.   Neurologic:  sensation intact to light touch, gait normal, and DTRs symmetrical and normal.   Skin:  top of R 5th finger - superficial 1/2 cm mobile cyst filled with clear mucous  sterilly prepped and peirced with #11 scalpel-- mucous expressed and then dressed with abx oint and band aid  pt tol well  Cervical Nodes:  No lymphadenopathy noted Inguinal Nodes:  No significant adenopathy Psych:  normal affect, talkative and pleasant    Impression & Recommendations:  Problem # 1:  HYPOTHYROIDISM (ICD-244.9) Assessment Unchanged  a little more fatigue and some nail changes check tsh/ ft4 and update  Her updated medication list for this problem includes:    Synthroid 75 Mcg Tabs (Levothyroxine sodium) .Marland Kitchen... Take one by mouth daily  Orders: Venipuncture (81191) TLB-TSH (Thyroid Stimulating Hormone) (84443-TSH) TLB-T4 (Thyrox), Free [phone]) T-Vitamin D (25-Hydroxy) [phone]) Specimen Handling (62952)  Labs Reviewed: TSH: 1.45 (06/22/2009)   Total T4: 11.1 (08/24/2008)    Chol: 226 (06/22/2009)   HDL: 79.00 (06/22/2009)   LDL: DEL (06/06/2008)   TG: 146.0 (06/22/2009)  Problem # 2:  CHOLELITHIASIS (ICD-574.20) Assessment: Improved resolved with ccy that went very well incisions are well healed   Problem # 3:  UNSPECIFIED VITAMIN D DEFICIENCY (ICD-268.9) Assessment: Improved now on 2000 international units daily check level and recommend was lower in past - imp with high dose weekly tx  Orders: Venipuncture (84132) TLB-TSH (Thyroid Stimulating Hormone) (84443-TSH) TLB-T4 (Thyrox), Free [phone]) T-Vitamin D (25-Hydroxy)  (36644-03474) Specimen Handling (25956)  Problem # 4:  OTHER SPECIFIED DISORDER OF SKIN (ICD-709.8) Assessment: New superficial cyst on R 5th finger- superficial and filled with mucous peirced and expressed -- pt tol well  firm bandaid applied and aftercare disc  adv to update if this re- occurs or any signs of infection  Complete Medication List: 1)  Flexeril 10 Mg Tabs (Cyclobenzaprine hcl) .... Take one by mouth at bedtime as needed 2)  Cenestin 1.25 Mg Tabs (Estrogens conj synthetic a) .... Take one by mouth daily 3)  Synthroid 75 Mcg Tabs (Levothyroxine sodium) .... Take one by mouth daily 4)  Flonase 50 Mcg/act Susp (Fluticasone propionate) .... 2 sprays in each nostril daily as needed 5)  Qualaquin 324 Mg Caps (Quinine sulfate) .... Take 2 by mouth three times a day as needed 6)  Calcium 1000 Mg With Mag 250 Plus Vit D  .... 1 pill daily 7)  Mvi  .Marland Kitchen.. 1  by mouth once daily 8)  Vitamin C 1000 Mg Tabs (Ascorbic acid) .... Take one by mouth daily 9)  Vitamin D 1000 Unit Tabs (Cholecalciferol) .... 4,000 units daily 10)  Fish Oil Oil (Fish oil) .... Take one by mouth daily  Patient Instructions: 1)  keep your finger covered with bandaid and triple antibiotic ointment until healed - wrap firmly but not tight  2)  labs for thyroid and vit D today  3)  glad you did well with your surgery  Prescriptions: FLONASE 50 MCG/ACT  SUSP (FLUTICASONE PROPIONATE) 2 sprays in each nostril daily as needed  #1 mdi x 11   Entered and Authorized by:   Judith Part MD   Signed by:   Judith Part MD on 01/01/2010   Method used:   Electronically to        [facility] (retail)       [address]       [place], [place]  [address]       Ph: [phone]       Fax: [phone]   RxID:   [id number]   Current Allergies (reviewed today): ! PCN ! CODEINE ! AMOXICILLIN ! TETRACYCLINE ! DARVON ! * SHELLFISH ! NORVASC (AMLODIPINE BESYLATE) !  CIPRO

## 2010-09-27 NOTE — Assessment & Plan Note (Signed)
Summary: CHECK CUT ON LEFT SHIN/CLE   Vital Signs:  Patient profile:   77 year old female Height:      64.5 inches Weight:      204 pounds BMI:     34.60 Temp:     98.1 degrees F oral Pulse rate:   72 / minute Pulse rhythm:   regular BP sitting:   158 / 96  (left arm) Cuff size:   large  Vitals Entered By: Lewanda Rife LPN (August 15, 2010 12:17 PM) CC: Cut on lt shin 2 weeks ago on chest at home. Now draining.    History of Present Illness: ran into a bench 2 weeks ago and not healing right  thinks she needed a stitch - but she did not go  bled quite a bit at first -- stopped with pressure  now is draining - pus ?  and a little blood   old wound -- cellulitis -- got better  she did not tolerate sulfa well  Allergies: 1)  ! Pcn 2)  ! Codeine 3)  ! Amoxicillin 4)  ! Tetracycline 5)  ! Darvon 6)  ! * Shellfish 7)  ! Norvasc (Amlodipine Besylate) 8)  ! Cipro 9)  ! Septra  Past History:  Past Medical History: Last updated: 04/20/2009 Allergic rhinitis Osteoarthritis leg cramps  fibromyalgia  HTN  hypothyroid  hyperlipidemia  menopausal syndrome   Past Surgical History: Last updated: 01/10/2010 colonoscopy 5/09 small polyp --- re check in 5 years  Hysterectomy- total, fibroids Breast biopsy- benign Rotator cuff repair Skin lesion- pre melanoma Abd [place]- neg (11/1998) Abd US- gallbladder polyps (01/2001) Hida scan- neg (01/2001) Colonoscpy- diverticulosis, polyp (1998) Dexa- osteopenia (10/2001) EGD (2004) Cataracts Colonoscopy- diverticulosis, hem (11/2002) Right     surgery-orthoscopic for tear  (04/206) Colonoscopy- diverticulosis, polyp (5/09) stable lung nodule LUL 9 mm   Family History: Last updated: 03/29/2008 Father: parkinsons  Mother: allergies Siblings: brother with colon cancer son with kidney failure (congenital)  Social History: Last updated: 03/29/2008 non smoker occ alcohol married-husband does the cooking  works outside home     Risk Factors: Smoking Status: never (01/25/2008)  Review of Systems General:  Denies chills, fatigue, fever, loss of appetite, and malaise. Eyes:  Denies blurring and eye irritation. CV:  Denies chest pain or discomfort and lightheadness. Resp:  Denies cough and wheezing. GI:  Denies abdominal pain, change in bowel habits, indigestion, and nausea. MS:  Denies joint redness and joint swelling. Derm:  Complains of lesion(s) and poor wound healing; denies itching. Neuro:  Denies numbness and tingling. Heme:  Denies abnormal bruising and bleeding.  Physical Exam  General:  NAD R knee with minimally erythematous 2cm, blanching lesion inferior to joint line.  Deeper palpation shows <1cm diameter lesion that is minimally tender to palpation.  No other skin findings.  Eyes:  Conjunctiva clear bilaterally.  Neck:  supple with full rom and no masses or thyromegally, no JVD or carotid bruit  Heart:  Normal rate and regular rhythm. S1 and S2 normal without gallop, murmur, click, rub or other extra sounds. Msk:  see skin exam for L leg  Pulses:  R and L carotid,radial,femoral,dorsalis pedis and posterior tibial pulses are full and equal bilaterally Extremities:  no cce  Neurologic:  sensation intact to light touch.   Skin:  skin wound ant L shin  3-4 cm linear and deep in middle some healing granulation tissue drain- tan to clear/ no pus minimally tender cx taken  dressed wth tefla and bactroban and ace bandage Psych:  normal affect, talkative and pleasant    Impression & Recommendations:  Problem # 1:  WOUND, LEG (ICD-891.0) Assessment New healing wound L leg -- was deep and healing from 2ndary intention  healthy granulation tissue and clear drainage /scant redness cover with zithromax (mult drug all) also bactroban  wound cx taken  drsg changes disc -see inst pt advised to update me if symptoms worsen or do not improve  Orders: T-Culture, Wound  ([id number]) Prescription Created Electronically ([id number])  Complete Medication List: 1)  Flexeril 10 Mg Tabs (Cyclobenzaprine hcl) .... Take one by mouth at bedtime as needed 2)  Cenestin 1.25 Mg Tabs (Estrogens conj synthetic a) .... Take one by mouth daily 3)  Synthroid 75 Mcg Tabs (Levothyroxine sodium) .... Take one by mouth daily 4)  Flonase 50 Mcg/act Susp (Fluticasone propionate) .... 2 sprays in each nostril daily as needed 5)  Qualaquin 324 Mg Caps (Quinine sulfate) .... Take 2 by mouth three times a day as needed 6)  Vitamin C 1000 Mg Tabs (Ascorbic acid) .... Take one by mouth daily 7)  Vitamin D 1000 Unit Tabs (Cholecalciferol) .... 4,000 units daily 8)  Clobetasol Propionate 0.05 % Crea (Clobetasol propionate) .... Apply to area two times a day as needed 9)  Biotin 10 Mg Tabs (Biotin) .... Daily 10)  Lutein 20 Mg Caps (Lutein) .... Daily 11)  Multivitamins Tabs (Multiple vitamin) .... Take 1 tablet by mouth once a day 12)  Calcium Carbonate-vitamin D 600-400 Mg-unit Tabs (Calcium carbonate-vitamin d) .... Daily 13)  Flax Oil (Flaxseed (linseed)) .... Take 1 capsule by mouth once a day 14)  Lanacane Anti-bacterial 20-0.2 % Aero (Benzocaine-benzethonium) .... Otc as directed. 15)  Hydrogen Peroxide 3 % Soln (Hydrogen peroxide) .... Otc as directed. 16)  Zithromax Z-pak 250 Mg Tabs (Azithromycin) .... Take by mouth as directed 17)  Bactroban 2 % Oint (Mupirocin) .... Apply to affected area two times a day with dressing changes  Patient Instructions: 1)  keep wound clean with antibacterial soap and water 2)  no more peroxide  3)  non stick dressing with gentle ace bandage 4)  elevate when you can  5)  I will send wound culture  6)  take zpak as directed  7)  use bactroban two times a day  8)  update me asap if worse/ redness/ pain/ drainge or fever  Prescriptions: BACTROBAN 2 % OINT (MUPIROCIN) apply to affected area two times a day with dressing changes  #1 medium x  0   Entered and Authorized by:   Judith Part MD   Signed by:   Judith Part MD on 08/15/2010   Method used:   Electronically to        [facility] (retail)       [address]       [place], [place]  [address]       Ph: [phone]       Fax: [phone]   RxID:   [id number] ZITHROMAX Z-PAK 250 MG TABS (AZITHROMYCIN) take by mouth as directed  #1 pack x 0   Entered and Authorized by:   Judith Part MD   Signed by:   Judith Part MD on 08/15/2010   Method used:   Electronically to        [facility] (retail)       [address]  [place], [place]  [address]       Ph: [phone]       Fax: [phone]   RxID:   [id number]    Orders Added: 1)  T-Culture, Wound [[id number]] 2)  Prescription Created Electronically [[id number]] 3)  Est. Patient Level III [84132]    Current Allergies (reviewed today): ! PCN ! CODEINE ! AMOXICILLIN ! TETRACYCLINE ! DARVON ! * SHELLFISH ! NORVASC (AMLODIPINE BESYLATE) ! CIPRO ! SEPTRA  Appended Document: CHECK CUT ON LEFT SHIN/CLE

## 2010-10-03 NOTE — Progress Notes (Signed)
Summary: Cyclobenzaprine 10mg   Phone Note Refill Request Call back at [phone] Message from:  [facility] on September 24, 2010 4:42 PM  Refills Requested: Medication #1:  FLEXERIL 10 MG  TABS take one by mouth at bedtime as needed   Last Refilled: 08/23/2010 [facility] electronically request refill on Cyclobenzaprine 10mg  Last filled 08/23/10. Please advise.    Method Requested: Telephone to Pharmacy Initial call taken by: Lewanda Rife LPN,  September 24, 2010 4:47 PM  Follow-up for Phone Call        px written on EMR for call in  Follow-up by: Judith Part MD,  September 24, 2010 4:56 PM  Additional Follow-up for Phone Call Additional follow up Details #1::        Medication phoned to [facility] as instructed. Lewanda Rife LPN  September 24, 2010 5:23 PM     Prescriptions: FLEXERIL 10 MG  TABS (CYCLOBENZAPRINE HCL) take one by mouth at bedtime as needed  #30 x 5   Entered and Authorized by:   Judith Part MD   Signed by:   Lewanda Rife LPN on 45/40/9811   Method used:   Telephoned to ...       [facility] (retail)       [address]       [place], [place]  [address]       Ph: [phone]       Fax: [phone]   RxID:   [id number]

## 2010-10-23 ENCOUNTER — Ambulatory Visit (INDEPENDENT_AMBULATORY_CARE_PROVIDER_SITE_OTHER): Payer: Medicare Other | Admitting: Family Medicine

## 2010-10-23 ENCOUNTER — Encounter (INDEPENDENT_AMBULATORY_CARE_PROVIDER_SITE_OTHER): Payer: Self-pay | Admitting: *Deleted

## 2010-10-23 ENCOUNTER — Encounter: Payer: Self-pay | Admitting: Family Medicine

## 2010-10-23 DIAGNOSIS — K5900 Constipation, unspecified: Secondary | ICD-10-CM

## 2010-10-23 DIAGNOSIS — K921 Melena: Secondary | ICD-10-CM | POA: Insufficient documentation

## 2010-10-23 DIAGNOSIS — I10 Essential (primary) hypertension: Secondary | ICD-10-CM

## 2010-11-01 NOTE — Letter (Signed)
Summary: New Patient letter  [facility]  [address]   [place], [place] [address]   Phone: [phone]  Fax: [phone]       10/23/2010 MRN: [id number]  [facility] Linda Mcgrath [address] [place], [place]  [address]  [place]  Dear Ms. Linda Mcgrath,  Welcome to the Gastroenterology Division at [facility].    You are scheduled to see Dr.  Marina Goodell on 11/29/2010 at 9:30 on the 3rd floor at [facility], [address]  We ask that you try to arrive at our office 15 minutes prior to your appointment time to allow for check-in.  We would like you to complete the enclosed self-administered evaluation form prior to your visit and bring it with you on the day of your appointment.  We will review it with you.  Also, please bring a complete list of all your medications or, if you prefer, bring the medication bottles and we will list them.  Please bring your insurance card so that we may make a copy of it.  If your insurance requires a referral to see a specialist, please bring your referral form from your primary care physician.  Co-payments are due at the time of your visit and may be paid by cash, check or credit card.     Your office visit will consist of a consult with your physician (includes a physical exam), any laboratory testing he/she may order, scheduling of any necessary diagnostic testing (e.g. x-ray, ultrasound, CT-scan), and scheduling of a procedure (e.g. Endoscopy, Colonoscopy) if required.  Please allow enough time on your schedule to allow for any/all of these possibilities.    If you cannot keep your appointment, please call [phone] to cancel or reschedule prior to your appointment date.  This allows [place] the opportunity to schedule an appointment for another patient in need of care.  If you do not cancel or reschedule by 5 p.m. the business day prior to your appointment date, you will be charged a $50.00 late cancellation/no-show fee.    Thank  you for choosing [facility] for your medical needs.  We appreciate the opportunity to care for you.  Please visit [place] at our website  to learn more about our practice.                     Sincerely,                                                             The Gastroenterology Division

## 2010-11-01 NOTE — Assessment & Plan Note (Signed)
Summary: HEMORRHOIDS/CLE   MEDICARE/AARP   Vital Signs:  Patient profile:   77 year old female Height:      64.5 inches Weight:      205.75 pounds BMI:     34.90 Temp:     97.8 degrees F oral Pulse rate:   80 / minute Pulse rhythm:   regular BP sitting:   158 / 86  (left arm) Cuff size:   large  Vitals Entered By: Lewanda Rife LPN (October 23, 2010 2:15 PM) CC: hemmorhoids internal   History of Present Illness: here with hemorroid problem also bp is up again at 158/86 on first check  she says at home averages 140/80  hemorroids flared up again  2 weeks ago - had bright red blood from rectum  used and otc suppository (? what brand) and that helped  after that had more constipation for 3 days -- had to use miralax and fleets enema  stool was heavy and then got much better  she feels like something is keeping her bowels from moving  a lot of bloating some pain in her R side  has hx of diverticulosis  continues to have bad bouts of constipation  last GI was in 09   had gallbladder surgery last year   has never had hem surgery or banding    last colonosc 09 small polyp  gets acid reflux only if she eats the wrong thing  no n/v eats small portions in general      Allergies: 1)  ! Pcn 2)  ! Codeine 3)  ! Amoxicillin 4)  ! Tetracycline 5)  ! Darvon 6)  ! * Shellfish 7)  ! Norvasc (Amlodipine Besylate) 8)  ! Cipro 9)  ! Septra  Past History:  Past Surgical History: Last updated: 01/10/2010 colonoscopy 5/09 small polyp --- re check in 5 years  Hysterectomy- total, fibroids Breast biopsy- benign Rotator cuff repair Skin lesion- pre melanoma Abd [place]- neg (11/1998) Abd US- gallbladder polyps (01/2001) Hida scan- neg (01/2001) Colonoscpy- diverticulosis, polyp (1998) Dexa- osteopenia (10/2001) EGD (2004) Cataracts Colonoscopy- diverticulosis, hem (11/2002) Right     surgery-orthoscopic for tear  (04/206) Colonoscopy- diverticulosis, polyp (5/09) stable  lung nodule LUL 9 mm   Family History: Last updated: 03/29/2008 Father: parkinsons  Mother: allergies Siblings: brother with colon cancer son with kidney failure (congenital)  Social History: Last updated: 03/29/2008 non smoker occ alcohol married-husband does the cooking  works outside home   Risk Factors: Smoking Status: never (01/25/2008)  Past Medical History: Allergic rhinitis Osteoarthritis leg cramps  fibromyalgia  HTN  hypothyroid  hyperlipidemia  menopausal syndrome  internal hemorroids  Review of Systems General:  Denies fatigue, fever, loss of appetite, and malaise. Eyes:  Denies blurring and eye irritation. CV:  Denies chest pain or discomfort, palpitations, shortness of breath with exertion, and swelling of feet. Resp:  Denies cough, shortness of breath, and wheezing. GI:  Complains of bloody stools, constipation, gas, and indigestion; denies abdominal pain, change in bowel habits, dark tarry stools, loss of appetite, nausea, and vomiting. GU:  Denies dysuria and hematuria. Derm:  Denies itching, lesion(s), poor wound healing, and rash. Neuro:  Denies numbness and tingling. Psych:  mood has been ok . Endo:  Denies cold intolerance, excessive thirst, excessive urination, and heat intolerance. Heme:  Denies abnormal bruising.  Physical Exam  General:  overweight but generally well appearing  Head:  normocephalic, atraumatic, and no abnormalities observed.   Eyes:  vision grossly intact, pupils  equal, pupils round, and pupils reactive to light.  no conjunctival pallor, injection or icterus  Mouth:  pharynx pink and moist.   Neck:  supple with full rom and no masses or thyromegally, no JVD or carotid bruit  Chest Wall:  No deformities, masses, or tenderness noted. Lungs:  Normal respiratory effort, chest expands symmetrically. Lungs are clear to auscultation, no crackles or wheezes. Heart:  Normal rate and regular rhythm. S1 and S2 normal without gallop,  murmur, click, rub or other extra sounds. Abdomen:  Bowel sounds positive,abdomen soft and non-tender without masses, organomegaly or hernias noted. no renal bruits  Rectal:  No external abnormalities noted. Normal sphincter tone. No rectal masses or tenderness. no M noted heme neg stool today Msk:  No deformity or scoliosis noted of thoracic or lumbar spine.   Skin:  Intact without suspicious lesions or rashes Cervical Nodes:  No lymphadenopathy noted Inguinal Nodes:  No significant adenopathy Psych:  normal affect, talkative and pleasant    Impression & Recommendations:  Problem # 1:  HEMATOCHEZIA (ICD-578.1) Assessment New ? hemorroids or other  this is new along with intermittently severe constipation -- per pt as if there were a blockage  also bloating/ discomfort  will ref to GI for a visit rev last colonosc in 09  adv to use miralax or stool softeners as needed - and update if worse in meantime Orders: Gastroenterology Referral (GI) Hemoccult Guaiac-1 spec.(in office) (82270)  Problem # 2:  CONSTIPATION (ICD-564.00) Assessment: New see above  Orders: Gastroenterology Referral (GI)  Problem # 3:  HYPERTENSION, BENIGN ESSENTIAL (ICD-401.1) Assessment: New this is 3rd high bp in a row - disc dx of HTN  disc lifestyle change incl wt loss/ lower sodium diet/ exercise  start low dose lisinopril- update if side eff f/u 1 mo   Her updated medication list for this problem includes:    Lisinopril 10 Mg Tabs (Lisinopril) .Marland Kitchen... 1 by mouth once daily in am  Complete Medication List: 1)  Flexeril 10 Mg Tabs (Cyclobenzaprine hcl) .... Take one by mouth at bedtime as needed 2)  Cenestin 1.25 Mg Tabs (Estrogens conj synthetic a) .... Take one by mouth daily 3)  Synthroid 75 Mcg Tabs (Levothyroxine sodium) .... Take one by mouth daily 4)  Flonase 50 Mcg/act Susp (Fluticasone propionate) .... 2 sprays in each nostril daily as needed 5)  Qualaquin 324 Mg Caps (Quinine sulfate) ....  Take 2 by mouth three times a day as needed 6)  Vitamin C 1000 Mg Tabs (Ascorbic acid) .... Take one by mouth daily 7)  Vitamin D 1000 Unit Tabs (Cholecalciferol) .... 4,000 units daily 8)  Clobetasol Propionate 0.05 % Crea (Clobetasol propionate) .... Apply to area two times a day as needed 9)  Biotin 10 Mg Tabs (Biotin) .... Daily 10)  Lutein 20 Mg Caps (Lutein) .... Daily 11)  Multivitamins Tabs (Multiple vitamin) .... Take 1 tablet by mouth once a day 12)  Calcium Carbonate-vitamin D 600-400 Mg-unit Tabs (Calcium carbonate-vitamin d) .... Daily 13)  Flax Oil (Flaxseed (linseed)) .... Take 1 capsule by mouth once a day 14)  Lanacane Anti-bacterial 20-0.2 % Aero (Benzocaine-benzethonium) .... Otc as directed. 15)  Hydrogen Peroxide 3 % Soln (Hydrogen peroxide) .... Otc as directed. 16)  Cranberry 500 Mg Caps (Cranberry) .... Take 1 capsule by mouth once a day 17)  Lisinopril 10 Mg Tabs (Lisinopril) .Marland Kitchen.. 1 by mouth once daily in am  Patient Instructions: 1)  drink lots of water  2)  eat fiber  3)  miralax is ok daily if needed  4)  we will do referral to GI for visit regarding constipation and blood in stools  5)  start lisinopril for high blood pressure 6)  if any side effects like cough- update me 7)  keep check on bp at home 8)  follow up with me in about a month  Prescriptions: LISINOPRIL 10 MG TABS (LISINOPRIL) 1 by mouth once daily in am  #30 x 11   Entered and Authorized by:   Judith Part MD   Signed by:   Judith Part MD on 10/23/2010   Method used:   Electronically to        [facility] [address] (retail)       [address]       [place], [place]  [address]       Ph: [phone]       Fax: [phone]   RxID:   [id number]    Orders Added: 1)  Gastroenterology Referral [GI] 2)  Est. Patient Level IV [84132] 3)  Hemoccult Guaiac-1 spec.(in office) [82270]    Current Allergies (reviewed today): ! PCN ! CODEINE !  AMOXICILLIN ! TETRACYCLINE ! DARVON ! * SHELLFISH ! NORVASC (AMLODIPINE BESYLATE) ! CIPRO ! SEPTRA

## 2010-11-15 LAB — COMPREHENSIVE METABOLIC PANEL
ALT: 19 U/L (ref 0–35)
AST: 21 U/L (ref 0–37)
Albumin: 3.6 g/dL (ref 3.5–5.2)
Alkaline Phosphatase: 68 U/L (ref 39–117)
BUN: 16 mg/dL (ref 6–23)
CO2: 26 mEq/L (ref 19–32)
Calcium: 9 mg/dL (ref 8.4–10.5)
Chloride: 102 mEq/L (ref 96–112)
Creatinine, Ser: 0.84 mg/dL (ref 0.4–1.2)
GFR calc Af Amer: >60 mL/min (ref >60)
GFR calc non Af Amer: >60 mL/min (ref >60)
Glucose, Bld: 109 mg/dL — ABNORMAL HIGH (ref 70–99)
Potassium: 4.5 mEq/L (ref 3.5–5.1)
Sodium: 133 mEq/L — ABNORMAL LOW (ref 135–145)
Total Bilirubin: 0.4 mg/dL (ref 0.3–1.2)
Total Protein: 6.5 g/dL (ref 6.0–8.3)

## 2010-11-15 LAB — DIFFERENTIAL
Basophils Absolute: 0.1 10*3/uL (ref 0.0–0.1)
Basophils Relative: 1 % (ref 0–1)
Eosinophils Absolute: 0.4 10*3/uL (ref 0.0–0.7)
Eosinophils Relative: 4 % (ref 0–5)
Lymphocytes Relative: 44 % (ref 12–46)
Lymphs Abs: 4.4 10*3/uL — ABNORMAL HIGH (ref 0.7–4.0)
Monocytes Absolute: 0.6 10*3/uL (ref 0.1–1.0)
Monocytes Relative: 7 % (ref 3–12)
Neutro Abs: 4.5 10*3/uL (ref 1.7–7.7)
Neutrophils Relative %: 45 % (ref 43–77)

## 2010-11-15 LAB — CBC
HCT: 42.1 % (ref 36.0–46.0)
Hemoglobin: 14.6 g/dL (ref 12.0–15.0)
MCHC: 34.8 g/dL (ref 30.0–36.0)
MCV: 93.5 fL (ref 78.0–100.0)
Platelets: 277 10*3/uL (ref 150–400)
RBC: 4.5 MIL/uL (ref 3.87–5.11)
RDW: 12.4 % (ref 11.5–15.5)
WBC: 10 10*3/uL (ref 4.0–10.5)

## 2010-11-27 ENCOUNTER — Encounter: Payer: Self-pay | Admitting: Family Medicine

## 2010-11-27 ENCOUNTER — Ambulatory Visit (INDEPENDENT_AMBULATORY_CARE_PROVIDER_SITE_OTHER): Payer: Medicare Other | Admitting: Family Medicine

## 2010-11-27 DIAGNOSIS — E785 Hyperlipidemia, unspecified: Secondary | ICD-10-CM

## 2010-11-27 DIAGNOSIS — E559 Vitamin D deficiency, unspecified: Secondary | ICD-10-CM

## 2010-11-27 DIAGNOSIS — E039 Hypothyroidism, unspecified: Secondary | ICD-10-CM

## 2010-11-27 DIAGNOSIS — I10 Essential (primary) hypertension: Secondary | ICD-10-CM

## 2010-11-27 LAB — CBC WITH DIFFERENTIAL/PLATELET
Basophils Absolute: 0 10*3/uL (ref 0.0–0.1)
Basophils Relative: 0.5 % (ref 0.0–3.0)
Eosinophils Absolute: 0.3 10*3/uL (ref 0.0–0.7)
Eosinophils Relative: 3.2 % (ref 0.0–5.0)
HCT: 40.3 % (ref 36.0–46.0)
Hemoglobin: 13.9 g/dL (ref 12.0–15.0)
Lymphocytes Relative: 39.8 % (ref 12.0–46.0)
Lymphs Abs: 3.6 10*3/uL (ref 0.7–4.0)
MCHC: 34.4 g/dL (ref 30.0–36.0)
MCV: 95.2 fl (ref 78.0–100.0)
Monocytes Absolute: 0.5 10*3/uL (ref 0.1–1.0)
Monocytes Relative: 6 % (ref 3.0–12.0)
Neutro Abs: 4.6 10*3/uL (ref 1.4–7.7)
Neutrophils Relative %: 50.5 % (ref 43.0–77.0)
Platelets: 291 10*3/uL (ref 150.0–400.0)
RBC: 4.23 Mil/uL (ref 3.87–5.11)
RDW: 12.1 % (ref 11.5–14.6)
WBC: 9.1 10*3/uL (ref 4.5–10.5)

## 2010-11-27 MED ORDER — FLUTICASONE PROPIONATE 50 MCG/ACT NA SUSP: 2.0000 | Freq: Every day | NASAL | Status: DC

## 2010-11-27 NOTE — Assessment & Plan Note (Signed)
No clinical changes  tsh today and update

## 2010-11-27 NOTE — Progress Notes (Signed)
Subjective:    Patient ID: Linda Mcgrath, female    DOB: 01-09-1933, 77 y.o.   MRN: [id number]  HPI Here for f/u of HTN  Wt is up a few pounds   bp improved to 142/74 on first check   Put on lisinopril 10 mg last visit a month ago  No problems or side effects at all  No headache or dizziness At home 117-126 systolic at home -- over 70s   Due for vit D level Had imp after supplementation Takes otc regularly with ca  Exercise fair  Has osteopenia  Hypothyroid No recent dose change No symptoms No fatigue or hair or skin change For lab today  Lipids- will check with HTN labs today Diet fair  Fairly low sat fat- reviewed this  Less red meat and no fried foods  Lab Results  Component Value Date   CHOL 226* 06/22/2009   CHOL 236* 06/06/2008   CHOL 240* 03/29/2008   Lab Results  Component Value Date   HDL 79.00 06/22/2009   HDL 85.9 06/06/2008   HDL 33.2 03/29/2008   No results found for this basename: [facility]   Lab Results  Component Value Date   TRIG 146.0 06/22/2009   TRIG 79 06/06/2008   TRIG 111 03/29/2008   Lab Results  Component Value Date   CHOLHDL 3 06/22/2009   CHOLHDL 2.7 CALC 06/06/2008   CHOLHDL 3.2 CALC 03/29/2008   Past Medical History  Diagnosis Date  . Osteoarthritis   . Fibromyalgia   . Hypertension   . Hypothyroidism   . Hyperlipidemia   . Internal hemorrhoids   . Colon polyp 1999    small polyp  . Diverticulosis   . Lung nodule     stable LUL 9 mm  . Cataract   . Allergic rhinitis   . Leg cramps   . Menopausal syndrome   . UTI (urinary tract infection)     Past Surgical History  Procedure Date  . Abdominal hysterectomy     total-fibroids  . Rotator cuff repair     x 2  . Skin cancer excision     pre-melanoma  . Breast biopsy     benign  . Cataract extraction     bilateral  . Abd u/s 11/1998    negative  . Abd u/s 01/2001    gallbladder polyps  . Hida scan 01/2001    Negative  . Colonoscopy 1998   Diverticulosis; polyp\  . Dexa 10/2001    osteopenia  . Esophagogastroduodenoscopy 2004  . Colonoscopy 09/2002    Diverticulosis, hem  . Colonoscopy 12/2007    diverticulosis, polyp  . Appendectomy 1975  . Cholecystectomy     History   Social History  . Marital Status: Married    Spouse Name: N/A    Number of Children: 2  . Years of Education: N/A   Occupational History  . TRAVEL AGENT    Social History Main Topics  . Smoking status: Never Smoker   . Smokeless tobacco: Never Used  . Alcohol Use: No     occasional  . Drug Use: No  . Sexually Active: Not on file   Other Topics Concern  . Not on file   Social History Narrative   Non-smokerocc alcoholMarried-husband does the cookingWorks outside the home    Family History  Problem Relation Age of Onset  . Parkinsonism Father   . Colon cancer Brother   . Kidney failure Son     congenital  .  Allergies Mother        Review of Systems  Constitutional: Negative for chills, activity change and fatigue.  Eyes: Negative for pain and visual disturbance.  Respiratory: Negative for cough and shortness of breath.   Cardiovascular: Negative.   Gastrointestinal: Negative for abdominal pain, diarrhea and constipation.  Genitourinary: Negative for urgency and frequency.  Musculoskeletal: Positive for arthralgias.  Skin: Negative for color change, pallor and rash.  Neurological: Negative for tremors, weakness and headaches.  Hematological: Negative for adenopathy. Does not bruise/bleed easily.  Psychiatric/Behavioral: The patient is not nervous/anxious.        Objective:   Physical Exam  Constitutional: She appears well-developed and well-nourished.       overwt and well appearing   HENT:  Head: Normocephalic and atraumatic.  Eyes: Conjunctivae and EOM are normal. Pupils are equal, round, and reactive to light.  Neck: Normal range of motion. Neck supple. No JVD present. Carotid bruit is not present. No tracheal deviation  present. No thyromegaly present.  Cardiovascular: Normal rate, regular rhythm and normal heart sounds.   Pulmonary/Chest: Effort normal and breath sounds normal.  Abdominal: Soft. Bowel sounds are normal.  Musculoskeletal: She exhibits no edema and no tenderness.  Lymphadenopathy:    She has no cervical adenopathy.  Neurological: She is alert. She has normal reflexes. She exhibits normal muscle tone.  Skin: Skin is dry. No pallor.  Psychiatric: She has a normal mood and affect.          Assessment & Plan:

## 2010-11-27 NOTE — Patient Instructions (Signed)
Blood pressure is better Continue lisinopril at this dose Watch salt in diet  Stay active and work on weight loss Lab today Follow up in about 6 months

## 2010-11-27 NOTE — Assessment & Plan Note (Signed)
No clinical changes Has osteopenia  On otc vit d Level today

## 2010-11-27 NOTE — Assessment & Plan Note (Signed)
Labs today with HTN labs  Low sat fat diet recommended

## 2010-11-27 NOTE — Assessment & Plan Note (Signed)
Improved with lisinopril and even better at home  Disc lifestyle change and wt loss  Lab today for HTN  F/u 6 mo

## 2010-11-28 ENCOUNTER — Ambulatory Visit (INDEPENDENT_AMBULATORY_CARE_PROVIDER_SITE_OTHER): Payer: Medicare Other | Admitting: Internal Medicine

## 2010-11-28 ENCOUNTER — Encounter: Payer: Self-pay | Admitting: Internal Medicine

## 2010-11-28 DIAGNOSIS — R141 Gas pain: Secondary | ICD-10-CM

## 2010-11-28 DIAGNOSIS — R142 Eructation: Secondary | ICD-10-CM

## 2010-11-28 DIAGNOSIS — R1084 Generalized abdominal pain: Secondary | ICD-10-CM

## 2010-11-28 DIAGNOSIS — K648 Other hemorrhoids: Secondary | ICD-10-CM

## 2010-11-28 DIAGNOSIS — K5900 Constipation, unspecified: Secondary | ICD-10-CM

## 2010-11-28 DIAGNOSIS — Z800 Family history of malignant neoplasm of digestive organs: Secondary | ICD-10-CM

## 2010-11-28 DIAGNOSIS — R143 Flatulence: Secondary | ICD-10-CM

## 2010-11-28 LAB — COMPREHENSIVE METABOLIC PANEL
ALT: 25 U/L (ref 0–35)
AST: 26 U/L (ref 0–37)
Albumin: 3.4 g/dL — ABNORMAL LOW (ref 3.5–5.2)
Alkaline Phosphatase: 59 U/L (ref 39–117)
BUN: 17 mg/dL (ref 6–23)
CO2: 28 mEq/L (ref 19–32)
Calcium: 9.4 mg/dL (ref 8.4–10.5)
Chloride: 104 mEq/L (ref 96–112)
Creatinine, Ser: 1 mg/dL (ref 0.4–1.2)
GFR: 54.81 mL/min — ABNORMAL LOW (ref >60.00)
Glucose, Bld: 113 mg/dL — ABNORMAL HIGH (ref 70–99)
Potassium: 4.7 mEq/L (ref 3.5–5.1)
Sodium: 140 mEq/L (ref 135–145)
Total Bilirubin: 0.4 mg/dL (ref 0.3–1.2)
Total Protein: 6 g/dL (ref 6.0–8.3)

## 2010-11-28 LAB — LIPID PANEL
Cholesterol: 215 mg/dL — ABNORMAL HIGH (ref 0–200)
HDL: 76.6 mg/dL (ref >39.00)
Total CHOL/HDL Ratio: 3
Triglycerides: 100 mg/dL (ref 0.0–149.0)
VLDL: 20 mg/dL (ref 0.0–40.0)

## 2010-11-28 LAB — VITAMIN D 25 HYDROXY (VIT D DEFICIENCY, FRACTURES): Vit D, 25-Hydroxy: 56 ng/mL (ref 30–89)

## 2010-11-28 LAB — TSH: TSH: 3.12 u[IU]/mL (ref 0.35–5.50)

## 2010-11-28 LAB — LDL CHOLESTEROL, DIRECT: Direct LDL: 127.3 mg/dL

## 2010-11-29 ENCOUNTER — Ambulatory Visit: Payer: Medicare Other | Admitting: Internal Medicine

## 2010-12-04 ENCOUNTER — Encounter: Payer: Self-pay | Admitting: Internal Medicine

## 2010-12-04 NOTE — Progress Notes (Signed)
HISTORY OF PRESENT ILLNESS:  Linda Mcgrath is a 77 y.o. female with the below listed medical problems. She is sent now regarding change in bowel habits, RLQ abdominal discomfort, and rectal bleeding that began about 5-6 weeks ago. Was evaluated and had heme negative stool. Was treated for hemorrhoids and recommended Miralax for constipation.Has had prior colonoscopy, for a family hx of CRC in sibling at 21, in 2004 and most recently 2009 (complete exams, excellent preps, left sided diverticulosis, hemorrhoids, and tiny polyp (no path) ). With Miralax she has had significant improvement in constipation and associated resolution in abdominal discomfort. As well, no further minor rectal bleeding. GI ROS is otherwise negative including no complaints of N/V, weight loss, or fevers.  REVIEW OF SYSTEMS:  Non-GI ROS negative except for allergies, arthritis, back pain, fatigue, muscle cramps, and urinary leakage.  Past Medical History  Diagnosis Date  . Osteoarthritis   . Fibromyalgia   . Hypertension   . Hypothyroidism   . Hyperlipidemia   . Internal hemorrhoids   . Colon polyp 1999    small polyp  . Diverticulosis   . Lung nodule     stable LUL 9 mm  . Cataract   . Allergic rhinitis   . Leg cramps   . Menopausal syndrome   . UTI (urinary tract infection)     Past Surgical History  Procedure Date  . Abdominal hysterectomy     total-fibroids  . Rotator cuff repair     x 2  . Skin cancer excision     pre-melanoma  . Breast biopsy     benign  . Cataract extraction     bilateral  . Abd u/s 11/1998    negative  . Abd u/s 01/2001    gallbladder polyps  . Hida scan 01/2001    Negative  . Colonoscopy 1998    Diverticulosis; polyp\  . Dexa 10/2001    osteopenia  . Esophagogastroduodenoscopy 2004  . Colonoscopy 09/2002    Diverticulosis, hem  . Colonoscopy 12/2007    diverticulosis, polyp  . Appendectomy 1975  . Cholecystectomy     Social History Linda Mcgrath  reports that she has never smoked. She has never used smokeless tobacco. She reports that she does not drink alcohol or use illicit drugs.  family history includes Allergies in her mother; Colon cancer in her brother; Kidney failure in her son; and Parkinsonism in her father.  Allergies  Allergen Reactions  . Amlodipine Besylate     REACTION: muscle spasms, extremity swelling, low bp  . Amoxicillin   . Ciprofloxacin     REACTION: muscle spasms, insomnia  . Codeine     REACTION: hallucinations  . Penicillins   . Propoxyphene Hcl   . Sulfamethoxazole W/Trimethoprim     REACTION: dizzy, could not focus eyes and could not concentrate and nausea  . Tetracycline     REACTION: heart palpitations,muscle spasms       PHYSICAL EXAMINATION: Vital signs: BP 128/76  Pulse 64  Ht 5\' 6"  (1.676 m)  Wt 202 lb 6.4 oz (91.808 kg)  BMI 32.67 kg/m2  LMP 08/26/1969  Constitutional: generally well-appearing, no acute distress Psychiatric: alert and oriented x3, cooperative Eyes: extraocular movements intact, anicteric, conjunctiva pink Mouth: oral pharynx moist, no lesions Neck: supple no lymphadenopathy Cardiovascular: heart regular rate and rhythm, no murmur Lungs: clear to auscultation bilaterally Abdomen: soft, nontender, nondistended, no obvious ascites, no peritoneal signs, normal bowel sounds, no organomegaly Rectal: negative per Dr.  [name] Extremities: no lower extremity edema bilaterally Skin: no lesions on visible extremities Neuro: No focal deficits. No asterixis.    ASSESSMENT AND PLAN:  #1. Functional constipation. Resolved with Miralax  #2. Lower abdominal pain. Resolved with resolution of constipation.  #3. Minor rectal bleeding due to known internal hemorrhoids. Resolved with local therapy and correction of constipation.  #4. Family hx CRC. Prior colonoscopies in 2004 and 2009 as described.  PLAN:  1. Continue Miralax prn  2. Return for recurrent or  refractory problems  3. Keeps plans for follow up colonoscopy around 12/2012. Can be done sooner if anymore problems. Discussed w/ pt. Who agrees.

## 2010-12-05 ENCOUNTER — Encounter: Payer: Self-pay | Admitting: Internal Medicine

## 2010-12-05 ENCOUNTER — Encounter: Payer: Self-pay | Admitting: Gastroenterology

## 2011-01-22 ENCOUNTER — Other Ambulatory Visit: Payer: Self-pay | Admitting: *Deleted

## 2011-01-22 MED ORDER — ESTRADIOL 1 MG PO TABS: 1.0000 mg | ORAL_TABLET | Freq: Every day | ORAL | Status: DC

## 2011-01-22 NOTE — Telephone Encounter (Signed)
Rx called in as directed.   

## 2011-01-22 NOTE — Telephone Encounter (Signed)
Received fax from pharmacy stating that this medication is on backorder until the end of June.  Would you like to prescribe something else?  Please advise.

## 2011-01-22 NOTE — Telephone Encounter (Signed)
Px written for call in  For substitute until cenestin comes back Px written for call in

## 2011-03-13 ENCOUNTER — Other Ambulatory Visit: Payer: Self-pay | Admitting: Family Medicine

## 2011-03-13 NOTE — Telephone Encounter (Signed)
[facility] [address] request Estrace 1mg  and Flexeril 10mg . Patty at [facility] said Cenestin is still not available yet. (see 01/22/11 encounter).Please advise.

## 2011-03-13 NOTE — Telephone Encounter (Signed)
Px sent electronically  

## 2011-04-12 ENCOUNTER — Other Ambulatory Visit: Payer: Self-pay | Admitting: Family Medicine

## 2011-04-12 NOTE — Telephone Encounter (Signed)
[facility] [address] sent electronic request for refill on Synthroid 75 mcg. In centricity is listed on med list as DAW. But when tried to refill there was a warning type section that if DAW might change strength of med. Please advise.

## 2011-04-14 NOTE — Telephone Encounter (Signed)
Sent electronically 

## 2011-05-07 ENCOUNTER — Encounter: Payer: Self-pay | Admitting: Family Medicine

## 2011-05-07 ENCOUNTER — Ambulatory Visit (INDEPENDENT_AMBULATORY_CARE_PROVIDER_SITE_OTHER): Payer: Medicare Other | Admitting: Family Medicine

## 2011-05-07 DIAGNOSIS — M79609 Pain in unspecified limb: Secondary | ICD-10-CM

## 2011-05-07 DIAGNOSIS — M79606 Pain in leg, unspecified: Secondary | ICD-10-CM

## 2011-05-07 MED ORDER — TRAMADOL HCL 50 MG PO TABS: 50.0000 mg | ORAL_TABLET | Freq: Four times a day (QID) | ORAL | Status: DC | PRN

## 2011-05-07 NOTE — Patient Instructions (Signed)
Take tramadol for pain (it can make you drowsy).  You can take the ibuprofen with food.  Off load with the cane and gradually work back into regular walking.  This should gradually improve.  Take care.

## 2011-05-07 NOTE — Progress Notes (Signed)
When she stands, she gets pain behind L knee.  She has pain at night.  Less pain in the morning.  Known OA in R knee, but this is new in the L knee (started last week).  No known trauma, but she had been working in the yard. No falls.  No known audible pop or snap.  No h/o bruising, minimally puffy.  No FC.    Meds, vitals, and allergies reviewed.   ROS: See HPI.  Otherwise, noncontributory.  nad L knee minimally puffy but no bruising.  Normal knee rom, ACL/MCL/LCL intact on testing w/o meniscal click L calf origin and hamstring insertion ttp, same groups tender on testing.

## 2011-05-07 NOTE — Assessment & Plan Note (Signed)
Likely hamstring and calf strain, d/w pt about offloading and using ibuprofen (GI caution) and tramadol prn for pain.  Sedation caution on tramadol.  F/u prn.  Should gradually improve.  She agrees.

## 2011-05-30 ENCOUNTER — Encounter: Payer: Self-pay | Admitting: Family Medicine

## 2011-06-03 ENCOUNTER — Encounter: Payer: Self-pay | Admitting: Family Medicine

## 2011-06-03 ENCOUNTER — Ambulatory Visit (INDEPENDENT_AMBULATORY_CARE_PROVIDER_SITE_OTHER): Payer: Medicare Other | Admitting: Family Medicine

## 2011-06-03 VITALS — BP 126/70 | HR 76 | Temp 97.5°F | Ht 66.0 in | Wt 205.0 lb

## 2011-06-03 DIAGNOSIS — I10 Essential (primary) hypertension: Secondary | ICD-10-CM

## 2011-06-03 DIAGNOSIS — R739 Hyperglycemia, unspecified: Secondary | ICD-10-CM

## 2011-06-03 DIAGNOSIS — E039 Hypothyroidism, unspecified: Secondary | ICD-10-CM

## 2011-06-03 DIAGNOSIS — R7303 Prediabetes: Secondary | ICD-10-CM | POA: Insufficient documentation

## 2011-06-03 DIAGNOSIS — E785 Hyperlipidemia, unspecified: Secondary | ICD-10-CM

## 2011-06-03 DIAGNOSIS — M79606 Pain in leg, unspecified: Secondary | ICD-10-CM

## 2011-06-03 DIAGNOSIS — R7309 Other abnormal glucose: Secondary | ICD-10-CM

## 2011-06-03 DIAGNOSIS — M79609 Pain in unspecified limb: Secondary | ICD-10-CM

## 2011-06-03 LAB — HEMOGLOBIN A1C: Hgb A1c MFr Bld: 5.6 % (ref 4.6–6.5)

## 2011-06-03 NOTE — Assessment & Plan Note (Signed)
Rev last labs - fairly stable with high HDL and fair LDL (just under 130) Rev low sat fat diet  Will check before next f/u  No chol med at this time

## 2011-06-03 NOTE — Patient Instructions (Signed)
Work on low impact (swimming) or upper body exercise since your knee hurts Concentrate on weight loss to prevent diabetes- eat more green vegetables and lean protein  Avoid sweets/ sugar drinks as much as possible  Lab today for sugar  Schedule follow up in 6 months for annual check up with labs prior

## 2011-06-03 NOTE — Assessment & Plan Note (Signed)
Last fasting sugar was 113 and pt is overwt  No fam hx of DM  Disc risks and need for wt loss No symptoms of DM Check a1c today and update Rev low glycemic diet Rev low impact exercise she could do with her knee pain

## 2011-06-03 NOTE — Assessment & Plan Note (Signed)
Pt recently also dx with small cartilage tear (? Meniscus) by her ortho  Also oa- so waiting on any surgery for now Needs to use caution and choose low impact exercise

## 2011-06-03 NOTE — Assessment & Plan Note (Signed)
Continued good control on ace Rev lifestyle change and need for wt loss also  No change in med

## 2011-06-03 NOTE — Progress Notes (Signed)
Subjective:    Patient ID: Linda Mcgrath, female    DOB: 1933-05-02, 77 y.o.   MRN: [id number]  HPI Here for f/u of chronic med problems - incl HTN and hyperlipidemia and hyperglycemia and hypothyroid  Has been fair overall   Hurt her L knee- and saw Dr Para March (after working in garden)- then saw her ortho -- and a little of the meniscus was torn  - not enough to do surgery  That has slowed her down a bit  Also has OA which complicates things   HTN - 126/70 Good control No cp or palpitations or headaches  On ace- no side effects   Lipid-last LDL 127 Diet- is overall very low fat  Lab Results  Component Value Date   CHOL 215* 11/27/2010   CHOL 226* 06/22/2009   CHOL 236* 06/06/2008   Lab Results  Component Value Date   HDL 76.60 11/27/2010   HDL 79.00 06/22/2009   HDL 85.9 06/06/2008   No results found for this basename: [facility]   Lab Results  Component Value Date   TRIG 100.0 11/27/2010   TRIG 146.0 06/22/2009   TRIG 79 06/06/2008   Lab Results  Component Value Date   CHOLHDL 3 11/27/2010   CHOLHDL 3 06/22/2009   CHOLHDL 2.7 CALC 06/06/2008   Lab Results  Component Value Date   LDLDIRECT 127.3 11/27/2010   LDLDIRECT 129.6 06/22/2009   LDLDIRECT 123.6 06/06/2008   Wt is up 6 lb with bmi of 33-- due to knee injury  Also wearing heavy shoes  Is eating pretty healthy-- tries to eat mostly fruit and veg   Last labs in April tsh stable No hypothyroid symptoms Feels like it may be lower than it should be - due to chronic fatigue , but understands her fatigue is more likely multifactorial No change in skin or hair    113 was last sugar fasting  No DM in family  Is doing her best to stay away from sweet drinks / very seldom eats sweets  Eats some bread- whole wheat - toast/ breakfast/ almost no pasta  Diet -- working on   She is still working - office job  Patient Active Problem List  Diagnoses  . HYPOTHYROIDISM  . UNSPECIFIED VITAMIN D DEFICIENCY  .  HYPERLIPIDEMIA  . ALLERGIC RHINITIS  . IBS  . MENOPAUSAL SYNDROME  . OSTEOARTHRITIS  . FIBROMYALGIA  . OSTEOPENIA  . OSTEOARTHRITIS, HANDS, BILATERAL  . HYPERTENSION, BENIGN ESSENTIAL  . CONSTIPATION  . HEMATOCHEZIA  . Leg pain  . Hyperglycemia   Past Medical History  Diagnosis Date  . Osteoarthritis   . Fibromyalgia   . Hypertension   . Hypothyroidism   . Hyperlipidemia   . Internal hemorrhoids   . Colon polyp 1999    small polyp  . Diverticulosis   . Lung nodule     stable LUL 9 mm  . Cataract   . Allergic rhinitis   . Leg cramps   . Menopausal syndrome   . UTI (urinary tract infection)    Past Surgical History  Procedure Date  . Abdominal hysterectomy     total-fibroids  . Rotator cuff repair     x 2  . Skin cancer excision     pre-melanoma  . Breast biopsy     benign  . Cataract extraction     bilateral  . Abd u/s 11/1998    negative  . Abd u/s 01/2001    gallbladder polyps  .  Hida scan 01/2001    Negative  . Colonoscopy 1998    Diverticulosis; polyp\  . Dexa 10/2001    osteopenia  . Esophagogastroduodenoscopy 2004  . Colonoscopy 09/2002    Diverticulosis, hem  . Colonoscopy 12/2007    diverticulosis, polyp  . Appendectomy 1975  . Cholecystectomy    History  Substance Use Topics  . Smoking status: Never Smoker   . Smokeless tobacco: Never Used  . Alcohol Use: No     occasional   Family History  Problem Relation Age of Onset  . Parkinsonism Father   . Colon cancer Brother   . Kidney failure Son     congenital  . Allergies Mother    Allergies  Allergen Reactions  . Amlodipine Besylate     REACTION: muscle spasms, extremity swelling, low bp  . Amoxicillin   . Ciprofloxacin     REACTION: muscle spasms, insomnia  . Codeine     REACTION: hallucinations  . Penicillins   . Propoxyphene Hcl   . Sulfamethoxazole W/Trimethoprim     REACTION: dizzy, could not focus eyes and could not concentrate and nausea  . Tetracycline     REACTION:  heart palpitations,muscle spasms   Current Outpatient Prescriptions on File Prior to Visit  Medication Sig Dispense Refill  . Ascorbic Acid (VITAMIN C) 1000 MG tablet Take 1,000 mg by mouth daily.        . Biotin 10 MG TABS Take by mouth. daily       . Calcium Carbonate-Vit D-Min 600-400 MG-UNIT TABS Take by mouth. daily       . cetirizine (ZYRTEC) 10 MG tablet Take 10 mg by mouth daily.        . Cranberry 500 MG CAPS Take by mouth. daily       . cyclobenzaprine (FLEXERIL) 10 MG tablet TAKE ONE TABLET BY MOUTH NIGHTLY AT BEDTIME AS NEEDED  30 tablet  4  . estradiol (ESTRACE) 1 MG tablet TAKE ONE TABLET BY MOUTH ONE TIME DAILY  30 tablet  3  . Flax OIL Take by mouth. daily       . fluticasone (FLONASE) 50 MCG/ACT nasal spray 2 sprays by Nasal route daily.  16 g  11  . guaiFENesin (MUCINEX) 600 MG 12 hr tablet OTC as directed       . lisinopril (PRINIVIL,ZESTRIL) 10 MG tablet Take 10 mg by mouth daily.        . Lutein 20 MG CAPS Take by mouth. daily       . Multiple Vitamin (MULTIVITAMIN) capsule Take 1 capsule by mouth daily.        Marland Kitchen SYNTHROID 75 MCG tablet Take 1 tablet (75 mcg total) by mouth daily.  30 each  5  . quiNINE (QUALAQUIN) 324 MG capsule Take 324 mg by mouth. Take 2 by mouth 3 times a day as needed       . traMADol (ULTRAM) 50 MG tablet Take 1 tablet (50 mg total) by mouth every 6 (six) hours as needed for pain (sedation caution).  30 tablet  1        Review of Systems Review of Systems  Constitutional: Negative for fever, appetite change,  and unexpected weight change. pos for generalized fatigue Eyes: Negative for pain and visual disturbance.  Respiratory: Negative for cough and shortness of breath.   Cardiovascular: Negative for cp or palpitations    Gastrointestinal: Negative for nausea, diarrhea and constipation.  Genitourinary: Negative for urgency and frequency. no excessive  thirst  Skin: Negative for pallor or rash   Neurological: Negative for weakness,  light-headedness, numbness and headaches.  Hematological: Negative for adenopathy. Does not bruise/bleed easily.  Psychiatric/Behavioral: Negative for dysphoric mood. The patient is not nervous/anxious.          Objective:   Physical Exam  Constitutional: She appears well-developed and well-nourished. No distress.       overwt and well appearing   HENT:  Head: Normocephalic and atraumatic.  Mouth/Throat: Oropharynx is clear and moist.  Eyes: Conjunctivae and EOM are normal. Pupils are equal, round, and reactive to light.  Neck: Normal range of motion. Neck supple. No JVD present. Carotid bruit is not present. No thyromegaly present.  Cardiovascular: Normal rate, regular rhythm, normal heart sounds and intact distal pulses.  Exam reveals no gallop.   Pulmonary/Chest: Effort normal and breath sounds normal. No respiratory distress. She has no wheezes.  Abdominal: Soft. Bowel sounds are normal. She exhibits no distension, no abdominal bruit and no mass. There is no tenderness.  Musculoskeletal: Normal range of motion. She exhibits no edema and no tenderness.       Some varicosities   Lymphadenopathy:    She has no cervical adenopathy.  Neurological: She is alert. She has normal reflexes. No cranial nerve deficit. Coordination normal.  Skin: Skin is warm and dry. No rash noted. No erythema. No pallor.  Psychiatric: She has a normal mood and affect.          Assessment & Plan:

## 2011-06-03 NOTE — Assessment & Plan Note (Signed)
Stable tsh Some fatigue that is likely due to low activity level rather than thyroid Re check 6 mo before f/u

## 2011-06-11 ENCOUNTER — Other Ambulatory Visit: Payer: Self-pay | Admitting: Family Medicine

## 2011-08-04 ENCOUNTER — Other Ambulatory Visit: Payer: Self-pay | Admitting: Family Medicine

## 2011-08-05 NOTE — Telephone Encounter (Signed)
Ok to fill 

## 2011-08-05 NOTE — Telephone Encounter (Signed)
Will refill electronically  

## 2011-08-29 DIAGNOSIS — L570 Actinic keratosis: Secondary | ICD-10-CM | POA: Diagnosis not present

## 2011-09-25 ENCOUNTER — Encounter: Payer: Self-pay | Admitting: Family Medicine

## 2011-09-25 ENCOUNTER — Ambulatory Visit (INDEPENDENT_AMBULATORY_CARE_PROVIDER_SITE_OTHER): Payer: Medicare Other | Admitting: Family Medicine

## 2011-09-25 VITALS — BP 120/78 | HR 85 | Temp 97.7°F | Ht 66.0 in | Wt 202.8 lb

## 2011-09-25 DIAGNOSIS — J0100 Acute maxillary sinusitis, unspecified: Secondary | ICD-10-CM | POA: Diagnosis not present

## 2011-09-25 DIAGNOSIS — H9209 Otalgia, unspecified ear: Secondary | ICD-10-CM | POA: Diagnosis not present

## 2011-09-25 MED ORDER — AZITHROMYCIN 250 MG PO TABS: ORAL_TABLET | ORAL | Status: AC

## 2011-09-26 NOTE — Progress Notes (Signed)
Patient Name: Linda Mcgrath Date of Birth: 1932-12-23 Medical Record Number: [id number] Gender: female Date of Encounter: 09/25/2011  History of Present Illness:  Linda Mcgrath is a 78 y.o. very pleasant female patient who presents with the following:  Pleasant patient presents for left-sided ear pain. She has had some sinus symptoms ongoing for about a month. Nasal congestion and periodic drainage. She does have some facial pain, more in the maxillary region. Some sinus discharge. Minimal amount of sore throat. No coughing. No fevers or chills. No tooth pain.  Patient Active Problem List  Diagnoses  . HYPOTHYROIDISM  . UNSPECIFIED VITAMIN D DEFICIENCY  . HYPERLIPIDEMIA  . ALLERGIC RHINITIS  . IBS  . MENOPAUSAL SYNDROME  . OSTEOARTHRITIS  . FIBROMYALGIA  . OSTEOPENIA  . OSTEOARTHRITIS, HANDS, BILATERAL  . HYPERTENSION, BENIGN ESSENTIAL  . CONSTIPATION  . HEMATOCHEZIA  . Leg pain  . Hyperglycemia   Past Medical History  Diagnosis Date  . Osteoarthritis   . Fibromyalgia   . Hypertension   . Hypothyroidism   . Hyperlipidemia   . Internal hemorrhoids   . Colon polyp 1999    small polyp  . Diverticulosis   . Lung nodule     stable LUL 9 mm  . Cataract   . Allergic rhinitis   . Leg cramps   . Menopausal syndrome   . UTI (urinary tract infection)    Past Surgical History  Procedure Date  . Abdominal hysterectomy     total-fibroids  . Rotator cuff repair     x 2  . Skin cancer excision     pre-melanoma  . Breast biopsy     benign  . Cataract extraction     bilateral  . Abd u/s 11/1998    negative  . Abd u/s 01/2001    gallbladder polyps  . Hida scan 01/2001    Negative  . Colonoscopy 1998    Diverticulosis; polyp\  . Dexa 10/2001    osteopenia  . Esophagogastroduodenoscopy 2004  . Colonoscopy 09/2002    Diverticulosis, hem  . Colonoscopy 12/2007    diverticulosis, polyp  . Appendectomy 1975  . Cholecystectomy    History  Substance  Use Topics  . Smoking status: Never Smoker   . Smokeless tobacco: Never Used  . Alcohol Use: No     occasional   Family History  Problem Relation Age of Onset  . Parkinsonism Father   . Colon cancer Brother   . Kidney failure Son     congenital  . Allergies Mother    Allergies  Allergen Reactions  . Amlodipine Besylate     REACTION: muscle spasms, extremity swelling, low bp  . Amoxicillin   . Ciprofloxacin     REACTION: muscle spasms, insomnia  . Codeine     REACTION: hallucinations  . Penicillins   . Propoxyphene Hcl   . Sulfamethoxazole W/Trimethoprim     REACTION: dizzy, could not focus eyes and could not concentrate and nausea  . Tetracycline     REACTION: heart palpitations,muscle spasms    Medication list has been reviewed and updated.  Review of Systems: ROS: GEN: Acute illness details above GI: Tolerating PO intake GU: maintaining adequate hydration and urination Pulm: No SOB Interactive and getting along well at home.  Otherwise, ROS is as per the HPI.   Physical Examination: Filed Vitals:   09/25/11 1545  BP: 120/78  Pulse: 85  Temp: 97.7 F (36.5 C)  TempSrc: Oral  Height: 5\' 6"  (  1.676 m)  Weight: 202 lb 12.8 oz (91.989 kg)  SpO2: 96%    Body mass index is 32.73 kg/(m^2).   Gen: WDWN, NAD; alert,appropriate and cooperative throughout exam  HEENT: Normocephalic and atraumatic. Throat clear, w/o exudate, no LAD, R TM clear, L TM - good landmarks, minimal fluid present. no rhinnorhea.  Left frontal and maxillary sinuses: Tender max Right frontal and maxillary sinuses: Tender less on max  Neck: No ant or post LAD CV: RRR, No M/G/R Pulm: Breathing comfortably in no resp distress. no w/c/r Abd: S,NT,ND,+BS Extr: no c/c/e Psych: full affect, pleasant   Assessment and Plan: 1. Sinusitis, acute maxillary  azithromycin (ZITHROMAX Z-PAK) 250 MG tablet  2. Ear pain      Long-standing history, suspect that the ear pain is all from  sinusitis.

## 2011-10-02 ENCOUNTER — Other Ambulatory Visit: Payer: Self-pay | Admitting: Family Medicine

## 2011-10-03 NOTE — Telephone Encounter (Signed)
Spoke with pt and she has been getting namebrand Synthroid.

## 2011-10-03 NOTE — Telephone Encounter (Signed)
[facility] [address] [facility] request refill Synthroid 75 mcg #30 x 11 and Lisinopril 10 mg #30 x 11.

## 2011-10-09 ENCOUNTER — Encounter: Payer: Self-pay | Admitting: Family Medicine

## 2011-10-09 ENCOUNTER — Ambulatory Visit (INDEPENDENT_AMBULATORY_CARE_PROVIDER_SITE_OTHER): Payer: Medicare Other | Admitting: Family Medicine

## 2011-10-09 VITALS — BP 120/86 | HR 76 | Temp 97.5°F | Ht 64.5 in | Wt 205.0 lb

## 2011-10-09 DIAGNOSIS — H1131 Conjunctival hemorrhage, right eye: Secondary | ICD-10-CM

## 2011-10-09 DIAGNOSIS — H1130 Conjunctival hemorrhage, unspecified eye: Secondary | ICD-10-CM

## 2011-10-09 NOTE — Assessment & Plan Note (Signed)
Mild in R eye - at 7:00 position- pain free and without vision change Suspect due to straining/ cough and position in hairdressing chair while on ibuprofen  Disc cause of these Expect it to resolve on own Disc red flags to watch for  Update if not starting to improve in a week or if worsening

## 2011-10-09 NOTE — Progress Notes (Signed)
Subjective:    Patient ID: Linda Mcgrath, female    DOB: 01-01-1933, 78 y.o.   MRN: [id number]  HPI Yesterday afternoon after lunch- a red spot popped up in her R eye   Recently saw Dr Patsy Lager for inner ear problem and sinus problem   Also has blepharitis - and rubs eyes to clean lids  Went to hair dresser yesterday- had to lean her head back into the sink  No pain No change in vision  No aspirin products Takes advil once per day   Some dry skin on face  Uses aaveno Worse around hair line ? From a product   Patient Active Problem List  Diagnoses  . HYPOTHYROIDISM  . UNSPECIFIED VITAMIN D DEFICIENCY  . HYPERLIPIDEMIA  . ALLERGIC RHINITIS  . IBS  . MENOPAUSAL SYNDROME  . OSTEOARTHRITIS  . FIBROMYALGIA  . OSTEOPENIA  . OSTEOARTHRITIS, HANDS, BILATERAL  . HYPERTENSION, BENIGN ESSENTIAL  . CONSTIPATION  . HEMATOCHEZIA  . Leg pain  . Hyperglycemia  . Conjunctival hemorrhage of right eye   Past Medical History  Diagnosis Date  . Osteoarthritis   . Fibromyalgia   . Hypertension   . Hypothyroidism   . Hyperlipidemia   . Internal hemorrhoids   . Colon polyp 1999    small polyp  . Diverticulosis   . Lung nodule     stable LUL 9 mm  . Cataract   . Allergic rhinitis   . Leg cramps   . Menopausal syndrome   . UTI (urinary tract infection)    Past Surgical History  Procedure Date  . Abdominal hysterectomy     total-fibroids  . Rotator cuff repair     x 2  . Skin cancer excision     pre-melanoma  . Breast biopsy     benign  . Cataract extraction     bilateral  . Abd u/s 11/1998    negative  . Abd u/s 01/2001    gallbladder polyps  . Hida scan 01/2001    Negative  . Colonoscopy 1998    Diverticulosis; polyp\  . Dexa 10/2001    osteopenia  . Esophagogastroduodenoscopy 2004  . Colonoscopy 09/2002    Diverticulosis, hem  . Colonoscopy 12/2007    diverticulosis, polyp  . Appendectomy 1975  . Cholecystectomy    History  Substance Use Topics    . Smoking status: Never Smoker   . Smokeless tobacco: Never Used  . Alcohol Use: No     occasional   Family History  Problem Relation Age of Onset  . Parkinsonism Father   . Colon cancer Brother   . Kidney failure Son     congenital  . Allergies Mother    Allergies  Allergen Reactions  . Amlodipine Besylate     REACTION: muscle spasms, extremity swelling, low bp  . Amoxicillin   . Ciprofloxacin     REACTION: muscle spasms, insomnia  . Codeine     REACTION: hallucinations  . Penicillins   . Propoxyphene Hcl   . Sulfamethoxazole W/Trimethoprim     REACTION: dizzy, could not focus eyes and could not concentrate and nausea  . Tetracycline     REACTION: heart palpitations,muscle spasms   Current Outpatient Prescriptions on File Prior to Visit  Medication Sig Dispense Refill  . Ascorbic Acid (VITAMIN C) 1000 MG tablet Take 1,000 mg by mouth daily.        . Biotin 10 MG TABS Take by mouth. daily       .  Calcium Carbonate-Vit D-Min 600-400 MG-UNIT TABS Take by mouth. daily       . cetirizine (ZYRTEC) 10 MG tablet Take 10 mg by mouth daily.        . Cholecalciferol (VITAMIN D) 2000 UNITS CAPS Take 1 capsule by mouth 2 (two) times daily.        . Cranberry 500 MG CAPS Take by mouth. daily       . cyclobenzaprine (FLEXERIL) 10 MG tablet TAKE ONE TABLET BY MOUTH NIGHTLY AT BEDTIME AS NEEDED  30 tablet  5  . estradiol (ESTRACE) 1 MG tablet TAKE ONE TABLET BY MOUTH ONE TIME DAILY  30 tablet  3  . Flax OIL Take by mouth. daily       . fluticasone (FLONASE) 50 MCG/ACT nasal spray 2 sprays by Nasal route daily.  16 g  11  . guaiFENesin (MUCINEX) 600 MG 12 hr tablet OTC as directed       . ibuprofen (ADVIL,MOTRIN) 200 MG tablet Take 400 mg by mouth 2 (two) times daily as needed.        Marland Kitchen lisinopril (PRINIVIL,ZESTRIL) 10 MG tablet TAKE ONE TABLET BY MOUTH EVERY DAY IN THE MORNING.  30 tablet  11  . Lutein 20 MG CAPS Take by mouth. daily       . Multiple Vitamin (MULTIVITAMIN) capsule Take  1 capsule by mouth daily.        . quiNINE (QUALAQUIN) 324 MG capsule Take 324 mg by mouth. Take 2 by mouth 3 times a day as needed       . SYNTHROID 75 MCG tablet TAKE ONE TABLET BY MOUTH ONE TIME DAILY  30 each  11  . traMADol (ULTRAM) 50 MG tablet Take 1 tablet (50 mg total) by mouth every 6 (six) hours as needed for pain (sedation caution).  30 tablet  1       Review of Systems Review of Systems  Constitutional: Negative for fever, appetite change, fatigue and unexpected weight change.  Eyes: Negative for pain and visual disturbance. no eye discharge  Respiratory: Negative for cough and shortness of breath.   Cardiovascular: Negative for cp or palpitations    Gastrointestinal: Negative for nausea, diarrhea and constipation.  Genitourinary: Negative for urgency and frequency.  Skin: Negative for pallor or rash  pos for dry skin Neurological: Negative for weakness, light-headedness, numbness and headaches.  Hematological: Negative for adenopathy. Does not bruise/bleed easily.  Psychiatric/Behavioral: Negative for dysphoric mood. The patient is not nervous/anxious.          Objective:   Physical Exam  Constitutional: She appears well-developed and well-nourished. No distress.  HENT:  Head: Normocephalic and atraumatic.  Right Ear: External ear normal.  Left Ear: External ear normal.  Nose: Nose normal.  Mouth/Throat: Oropharynx is clear and moist.  Eyes: Conjunctivae and EOM are normal. Pupils are equal, round, and reactive to light. Right eye exhibits no discharge. Left eye exhibits no discharge. No scleral icterus.       R eye - .5 cm conj hem at 7:00 position Does not overlap iris  Eye appears nl Nl vision and fundoscopic exam  Neck: Normal range of motion. Neck supple. No JVD present. Carotid bruit is not present. No thyromegaly present.  Cardiovascular: Normal rate, regular rhythm and normal heart sounds.   Pulmonary/Chest: Effort normal and breath sounds normal.    Lymphadenopathy:    She has no cervical adenopathy.  Neurological: She is alert.  Skin: Skin is warm and dry.  No erythema. No pallor.       Some dry skin around hairline No evidence of rosacea  Psychiatric: She has a normal mood and affect.          Assessment & Plan:

## 2011-10-09 NOTE — Patient Instructions (Signed)
The hemorrhage of eye (conjunctival) is benign and should go away on its own If it does not improve in 1-2 weeks or if any eye pain or vision change let me know  Try to avoid straining

## 2011-10-31 ENCOUNTER — Other Ambulatory Visit: Payer: Self-pay | Admitting: Family Medicine

## 2011-11-26 ENCOUNTER — Telehealth: Payer: Self-pay | Admitting: Family Medicine

## 2011-11-26 DIAGNOSIS — I10 Essential (primary) hypertension: Secondary | ICD-10-CM

## 2011-11-26 DIAGNOSIS — E559 Vitamin D deficiency, unspecified: Secondary | ICD-10-CM

## 2011-11-26 DIAGNOSIS — M899 Disorder of bone, unspecified: Secondary | ICD-10-CM

## 2011-11-26 DIAGNOSIS — E039 Hypothyroidism, unspecified: Secondary | ICD-10-CM

## 2011-11-26 DIAGNOSIS — E785 Hyperlipidemia, unspecified: Secondary | ICD-10-CM

## 2011-11-26 DIAGNOSIS — R739 Hyperglycemia, unspecified: Secondary | ICD-10-CM

## 2011-11-26 DIAGNOSIS — M949 Disorder of cartilage, unspecified: Secondary | ICD-10-CM

## 2011-11-26 NOTE — Telephone Encounter (Signed)
Message copied by Judy Pimple on Tue Nov 26, 2011  9:28 PM ------      Message from: Alvina Chou      Created: Thu Nov 21, 2011 10:54 AM      Regarding: lab orders for 4.3.13       6 month labs for f/u

## 2011-11-27 ENCOUNTER — Other Ambulatory Visit (INDEPENDENT_AMBULATORY_CARE_PROVIDER_SITE_OTHER): Payer: Medicare Other

## 2011-11-27 DIAGNOSIS — E559 Vitamin D deficiency, unspecified: Secondary | ICD-10-CM

## 2011-11-27 DIAGNOSIS — I10 Essential (primary) hypertension: Secondary | ICD-10-CM

## 2011-11-27 DIAGNOSIS — M899 Disorder of bone, unspecified: Secondary | ICD-10-CM

## 2011-11-27 DIAGNOSIS — E785 Hyperlipidemia, unspecified: Secondary | ICD-10-CM

## 2011-11-27 DIAGNOSIS — R7309 Other abnormal glucose: Secondary | ICD-10-CM | POA: Diagnosis not present

## 2011-11-27 DIAGNOSIS — E039 Hypothyroidism, unspecified: Secondary | ICD-10-CM

## 2011-11-27 DIAGNOSIS — M949 Disorder of cartilage, unspecified: Secondary | ICD-10-CM

## 2011-11-27 DIAGNOSIS — R739 Hyperglycemia, unspecified: Secondary | ICD-10-CM

## 2011-11-27 LAB — COMPREHENSIVE METABOLIC PANEL
ALT: 23 U/L (ref 0–35)
AST: 28 U/L (ref 0–37)
Albumin: 3.7 g/dL (ref 3.5–5.2)
Alkaline Phosphatase: 60 U/L (ref 39–117)
BUN: 16 mg/dL (ref 6–23)
CO2: 23 mEq/L (ref 19–32)
Calcium: 9.1 mg/dL (ref 8.4–10.5)
Chloride: 105 mEq/L (ref 96–112)
Creatinine, Ser: 1 mg/dL (ref 0.4–1.2)
GFR: 57.2 mL/min — ABNORMAL LOW (ref >60.00)
Glucose, Bld: 98 mg/dL (ref 70–99)
Potassium: 4.9 mEq/L (ref 3.5–5.1)
Sodium: 139 mEq/L (ref 135–145)
Total Bilirubin: 0.5 mg/dL (ref 0.3–1.2)
Total Protein: 6.9 g/dL (ref 6.0–8.3)

## 2011-11-27 LAB — HEMOGLOBIN A1C: Hgb A1c MFr Bld: 5.4 % (ref 4.6–6.5)

## 2011-11-27 LAB — TSH: TSH: 4.3 u[IU]/mL (ref 0.35–5.50)

## 2011-11-27 LAB — CBC WITH DIFFERENTIAL/PLATELET
Basophils Absolute: 0 10*3/uL (ref 0.0–0.1)
Basophils Relative: 0.6 % (ref 0.0–3.0)
Eosinophils Absolute: 0.2 10*3/uL (ref 0.0–0.7)
Eosinophils Relative: 3.5 % (ref 0.0–5.0)
HCT: 41.8 % (ref 36.0–46.0)
Hemoglobin: 14 g/dL (ref 12.0–15.0)
Lymphocytes Relative: 43.1 % (ref 12.0–46.0)
Lymphs Abs: 2.9 10*3/uL (ref 0.7–4.0)
MCHC: 33.4 g/dL (ref 30.0–36.0)
MCV: 95.4 fl (ref 78.0–100.0)
Monocytes Absolute: 0.5 10*3/uL (ref 0.1–1.0)
Monocytes Relative: 7.4 % (ref 3.0–12.0)
Neutro Abs: 3.1 10*3/uL (ref 1.4–7.7)
Neutrophils Relative %: 45.4 % (ref 43.0–77.0)
Platelets: 299 10*3/uL (ref 150.0–400.0)
RBC: 4.38 Mil/uL (ref 3.87–5.11)
RDW: 12.6 % (ref 11.5–14.6)
WBC: 6.8 10*3/uL (ref 4.5–10.5)

## 2011-11-27 LAB — LIPID PANEL
Cholesterol: 205 mg/dL — ABNORMAL HIGH (ref 0–200)
HDL: 74.9 mg/dL (ref >39.00)
Total CHOL/HDL Ratio: 3
Triglycerides: 82 mg/dL (ref 0.0–149.0)
VLDL: 16.4 mg/dL (ref 0.0–40.0)

## 2011-11-27 LAB — LDL CHOLESTEROL, DIRECT: Direct LDL: 124 mg/dL

## 2011-11-28 LAB — VITAMIN D 25 HYDROXY (VIT D DEFICIENCY, FRACTURES): Vit D, 25-Hydroxy: 61 ng/mL (ref 30–89)

## 2011-12-04 ENCOUNTER — Ambulatory Visit (INDEPENDENT_AMBULATORY_CARE_PROVIDER_SITE_OTHER): Payer: Medicare Other | Admitting: Family Medicine

## 2011-12-04 ENCOUNTER — Encounter: Payer: Self-pay | Admitting: Family Medicine

## 2011-12-04 VITALS — BP 110/70 | HR 88 | Temp 97.7°F | Ht 64.5 in | Wt 203.5 lb

## 2011-12-04 DIAGNOSIS — E039 Hypothyroidism, unspecified: Secondary | ICD-10-CM

## 2011-12-04 DIAGNOSIS — E785 Hyperlipidemia, unspecified: Secondary | ICD-10-CM | POA: Diagnosis not present

## 2011-12-04 DIAGNOSIS — R7309 Other abnormal glucose: Secondary | ICD-10-CM | POA: Diagnosis not present

## 2011-12-04 DIAGNOSIS — I10 Essential (primary) hypertension: Secondary | ICD-10-CM

## 2011-12-04 DIAGNOSIS — R739 Hyperglycemia, unspecified: Secondary | ICD-10-CM

## 2011-12-04 NOTE — Patient Instructions (Signed)
Try flexeril at night for fibromyalgia when it flares - for pain and also to help sleep  Cholesterol and sugar stable  Thyroid is stable  Make sure to drink enough water and practice healthy diet and low impact exercise  Follow up in about 6 months for annual exam with labs prior

## 2011-12-04 NOTE — Assessment & Plan Note (Signed)
a1c is low- this seems to be very well controlled with diet  Disc low glycemic diet  Urged wt loss Will continue to follow

## 2011-12-04 NOTE — Assessment & Plan Note (Signed)
bp in fair control at this time  No changes needed  Disc lifstyle change with low sodium diet and exercise   

## 2011-12-04 NOTE — Assessment & Plan Note (Signed)
Lipids stable  LDL under 130 Disc goals for lipids and reasons to control them Rev labs with pt Rev low sat fat diet in detail  Will continue to work on getting it lower

## 2011-12-04 NOTE — Assessment & Plan Note (Signed)
tsh stable No new symptoms (except fatigue with fibromyalgia flares)

## 2011-12-04 NOTE — Progress Notes (Signed)
Subjective:    Patient ID: Linda Mcgrath, female    DOB: 1933/01/14, 78 y.o.   MRN: [id number]  HPI Here for f/u of chronic conditions Has been feeling fair  Fibromyalgia has flared lately - pain in upper body/ shoulders - also her upper legs Makes it hard to sleep  Takes ibuprofen / tylenol  Has flexeril- forgets to take it  Wt is down 2 lb with bmi of 34  bp is  110/70   Today No cp or palpitations or headaches or edema  No side effects to medicines    Hyperglycemia Lab Results  Component Value Date   HGBA1C 5.4 11/27/2011     Hyperlipidemia  On diet control Lab Results  Component Value Date   CHOL 205* 11/27/2011   CHOL 215* 11/27/2010   CHOL 226* 06/22/2009   Lab Results  Component Value Date   HDL 74.90 11/27/2011   HDL 76.60 11/27/2010   HDL 79.00 06/22/2009   No results found for this basename: [facility]   Lab Results  Component Value Date   TRIG 82.0 11/27/2011   TRIG 100.0 11/27/2010   TRIG 146.0 06/22/2009   Lab Results  Component Value Date   CHOLHDL 3 11/27/2011   CHOLHDL 3 11/27/2010   CHOLHDL 3 06/22/2009   Lab Results  Component Value Date   LDLDIRECT 124.0 11/27/2011   LDLDIRECT 127.3 11/27/2010   LDLDIRECT 129.6 06/22/2009   trying to eat as healthy as she can    Hypothyroid Lab Results  Component Value Date   TSH 4.30 11/27/2011   no symptoms or changes No hair loss or skin dryness   Patient Active Problem List  Diagnoses  . HYPOTHYROIDISM  . UNSPECIFIED VITAMIN D DEFICIENCY  . HYPERLIPIDEMIA  . ALLERGIC RHINITIS  . IBS  . MENOPAUSAL SYNDROME  . OSTEOARTHRITIS  . FIBROMYALGIA  . OSTEOPENIA  . OSTEOARTHRITIS, HANDS, BILATERAL  . HYPERTENSION, BENIGN ESSENTIAL  . CONSTIPATION  . HEMATOCHEZIA  . Leg pain  . Hyperglycemia  . Conjunctival hemorrhage of right eye   Past Medical History  Diagnosis Date  . Osteoarthritis   . Fibromyalgia   . Hypertension   . Hypothyroidism   . Hyperlipidemia   . Internal hemorrhoids   . Colon  polyp 1999    small polyp  . Diverticulosis   . Lung nodule     stable LUL 9 mm  . Cataract   . Allergic rhinitis   . Leg cramps   . Menopausal syndrome   . UTI (urinary tract infection)    Past Surgical History  Procedure Date  . Abdominal hysterectomy     total-fibroids  . Rotator cuff repair     x 2  . Skin cancer excision     pre-melanoma  . Breast biopsy     benign  . Cataract extraction     bilateral  . Abd u/s 11/1998    negative  . Abd u/s 01/2001    gallbladder polyps  . Hida scan 01/2001    Negative  . Colonoscopy 1998    Diverticulosis; polyp\  . Dexa 10/2001    osteopenia  . Esophagogastroduodenoscopy 2004  . Colonoscopy 09/2002    Diverticulosis, hem  . Colonoscopy 12/2007    diverticulosis, polyp  . Appendectomy 1975  . Cholecystectomy    History  Substance Use Topics  . Smoking status: Never Smoker   . Smokeless tobacco: Never Used  . Alcohol Use: No     occasional  Family History  Problem Relation Age of Onset  . Parkinsonism Father   . Colon cancer Brother   . Kidney failure Son     congenital  . Allergies Mother    Allergies  Allergen Reactions  . Amlodipine Besylate     REACTION: muscle spasms, extremity swelling, low bp  . Amoxicillin   . Ciprofloxacin     REACTION: muscle spasms, insomnia  . Codeine     REACTION: hallucinations  . Penicillins   . Propoxyphene Hcl   . Sulfamethoxazole W/Trimethoprim     REACTION: dizzy, could not focus eyes and could not concentrate and nausea  . Tetracycline     REACTION: heart palpitations,muscle spasms   Current Outpatient Prescriptions on File Prior to Visit  Medication Sig Dispense Refill  . Ascorbic Acid (VITAMIN C) 1000 MG tablet Take 1,000 mg by mouth daily.        . Biotin 10 MG TABS Take by mouth. daily       . Calcium Carbonate-Vit D-Min 600-400 MG-UNIT TABS Take by mouth. daily       . Cholecalciferol (VITAMIN D) 2000 UNITS CAPS Take 1 capsule by mouth 2 (two) times daily.          . Cranberry 500 MG CAPS Take by mouth. daily       . cyclobenzaprine (FLEXERIL) 10 MG tablet TAKE ONE TABLET BY MOUTH NIGHTLY AT BEDTIME AS NEEDED  30 tablet  5  . estradiol (ESTRACE) 1 MG tablet TAKE ONE TABLET BY MOUTH ONE TIME DAILY  30 tablet  3  . Flax OIL Take by mouth. daily       . fluticasone (FLONASE) 50 MCG/ACT nasal spray 2 sprays by Nasal route daily.  16 g  11  . guaiFENesin (MUCINEX) 600 MG 12 hr tablet OTC as directed       . ibuprofen (ADVIL,MOTRIN) 200 MG tablet Take 400 mg by mouth 2 (two) times daily as needed.        Marland Kitchen lisinopril (PRINIVIL,ZESTRIL) 10 MG tablet TAKE ONE TABLET BY MOUTH EVERY DAY IN THE MORNING.  30 tablet  11  . loratadine (CLARITIN) 10 MG tablet Take 10 mg by mouth daily.      . Lutein 20 MG CAPS Take by mouth. daily       . Multiple Vitamin (MULTIVITAMIN) capsule Take 1 capsule by mouth daily.        . quiNINE (QUALAQUIN) 324 MG capsule Take 324 mg by mouth. Take 2 by mouth 3 times a day as needed       . SYNTHROID 75 MCG tablet TAKE ONE TABLET BY MOUTH ONE TIME DAILY  30 each  11     Review of Systems Review of Systems  Constitutional: Negative for fever, appetite change, fatigue and unexpected weight change.  Eyes: Negative for pain and visual disturbance.  Respiratory: Negative for cough and shortness of breath.   Cardiovascular: Negative for cp or palpitations    Gastrointestinal: Negative for nausea, diarrhea and constipation.  Genitourinary: Negative for urgency and frequency.  Skin: Negative for pallor or rash   MSK pos for myofascial pain without joint swelling  Neurological: Negative for weakness, light-headedness, numbness and headaches.  Hematological: Negative for adenopathy. Does not bruise/bleed easily.  Psychiatric/Behavioral: Negative for dysphoric mood. The patient is not nervous/anxious.          Objective:   Physical Exam  Constitutional: She appears well-developed and well-nourished. No distress.  HENT:  Head:  Normocephalic and  atraumatic.  Mouth/Throat: Oropharynx is clear and moist.  Eyes: Conjunctivae and EOM are normal. Pupils are equal, round, and reactive to light. No scleral icterus.  Neck: Normal range of motion. Neck supple. No JVD present. Carotid bruit is not present. No thyromegaly present.  Cardiovascular: Normal rate, regular rhythm, normal heart sounds and intact distal pulses.  Exam reveals no gallop.   Pulmonary/Chest: Effort normal and breath sounds normal. No respiratory distress. She has no wheezes.  Abdominal: Soft. Bowel sounds are normal. She exhibits no distension, no abdominal bruit and no mass. There is no tenderness.  Musculoskeletal: Normal range of motion. She exhibits no edema and no tenderness.  Lymphadenopathy:    She has no cervical adenopathy.  Neurological: She is alert. She has normal reflexes. No cranial nerve deficit. She exhibits normal muscle tone. Coordination normal.  Skin: Skin is warm and dry. No rash noted. No erythema. No pallor.  Psychiatric: She has a normal mood and affect.          Assessment & Plan:

## 2011-12-26 DIAGNOSIS — M999 Biomechanical lesion, unspecified: Secondary | ICD-10-CM | POA: Diagnosis not present

## 2011-12-26 DIAGNOSIS — IMO0002 Reserved for concepts with insufficient information to code with codable children: Secondary | ICD-10-CM | POA: Diagnosis not present

## 2011-12-26 DIAGNOSIS — M545 Low back pain, unspecified: Secondary | ICD-10-CM | POA: Diagnosis not present

## 2011-12-30 DIAGNOSIS — M545 Low back pain, unspecified: Secondary | ICD-10-CM | POA: Diagnosis not present

## 2011-12-30 DIAGNOSIS — IMO0002 Reserved for concepts with insufficient information to code with codable children: Secondary | ICD-10-CM | POA: Diagnosis not present

## 2011-12-30 DIAGNOSIS — M999 Biomechanical lesion, unspecified: Secondary | ICD-10-CM | POA: Diagnosis not present

## 2012-01-01 DIAGNOSIS — H40019 Open angle with borderline findings, low risk, unspecified eye: Secondary | ICD-10-CM | POA: Diagnosis not present

## 2012-01-01 DIAGNOSIS — Z961 Presence of intraocular lens: Secondary | ICD-10-CM | POA: Diagnosis not present

## 2012-01-24 ENCOUNTER — Other Ambulatory Visit: Payer: Self-pay | Admitting: Family Medicine

## 2012-01-24 NOTE — Telephone Encounter (Signed)
Will refill electronically  

## 2012-02-22 ENCOUNTER — Other Ambulatory Visit: Payer: Self-pay | Admitting: Family Medicine

## 2012-02-24 ENCOUNTER — Other Ambulatory Visit: Payer: Self-pay

## 2012-02-24 MED ORDER — ESTRADIOL 1 MG PO TABS: 1.0000 mg | ORAL_TABLET | Freq: Once | ORAL | Status: DC

## 2012-04-09 ENCOUNTER — Other Ambulatory Visit: Payer: Self-pay | Admitting: Family Medicine

## 2012-05-25 DIAGNOSIS — M9981 Other biomechanical lesions of cervical region: Secondary | ICD-10-CM | POA: Diagnosis not present

## 2012-05-25 DIAGNOSIS — M545 Low back pain, unspecified: Secondary | ICD-10-CM | POA: Diagnosis not present

## 2012-05-25 DIAGNOSIS — M999 Biomechanical lesion, unspecified: Secondary | ICD-10-CM | POA: Diagnosis not present

## 2012-05-27 DIAGNOSIS — M545 Low back pain, unspecified: Secondary | ICD-10-CM | POA: Diagnosis not present

## 2012-05-27 DIAGNOSIS — M999 Biomechanical lesion, unspecified: Secondary | ICD-10-CM | POA: Diagnosis not present

## 2012-05-27 DIAGNOSIS — M9981 Other biomechanical lesions of cervical region: Secondary | ICD-10-CM | POA: Diagnosis not present

## 2012-05-31 DIAGNOSIS — Z23 Encounter for immunization: Secondary | ICD-10-CM | POA: Diagnosis not present

## 2012-06-09 ENCOUNTER — Telehealth: Payer: Self-pay | Admitting: Family Medicine

## 2012-06-09 DIAGNOSIS — M899 Disorder of bone, unspecified: Secondary | ICD-10-CM

## 2012-06-09 DIAGNOSIS — E785 Hyperlipidemia, unspecified: Secondary | ICD-10-CM

## 2012-06-09 DIAGNOSIS — M949 Disorder of cartilage, unspecified: Secondary | ICD-10-CM

## 2012-06-09 DIAGNOSIS — E559 Vitamin D deficiency, unspecified: Secondary | ICD-10-CM

## 2012-06-09 DIAGNOSIS — E039 Hypothyroidism, unspecified: Secondary | ICD-10-CM

## 2012-06-09 DIAGNOSIS — R739 Hyperglycemia, unspecified: Secondary | ICD-10-CM

## 2012-06-09 DIAGNOSIS — I10 Essential (primary) hypertension: Secondary | ICD-10-CM

## 2012-06-09 NOTE — Telephone Encounter (Signed)
Message copied by Judy Pimple on Tue Jun 09, 2012  9:16 PM ------      Message from: Alvina Chou      Created: Thu Jun 04, 2012 11:18 AM      Regarding: Lab orders for Wednesday 10.15.13       Patient is scheduled for CPX labs, please order future labs, Thanks , Camelia Eng

## 2012-06-10 ENCOUNTER — Other Ambulatory Visit (INDEPENDENT_AMBULATORY_CARE_PROVIDER_SITE_OTHER): Payer: Medicare Other

## 2012-06-10 DIAGNOSIS — E785 Hyperlipidemia, unspecified: Secondary | ICD-10-CM | POA: Diagnosis not present

## 2012-06-10 DIAGNOSIS — E559 Vitamin D deficiency, unspecified: Secondary | ICD-10-CM | POA: Diagnosis not present

## 2012-06-10 DIAGNOSIS — R7309 Other abnormal glucose: Secondary | ICD-10-CM | POA: Diagnosis not present

## 2012-06-10 DIAGNOSIS — I10 Essential (primary) hypertension: Secondary | ICD-10-CM | POA: Diagnosis not present

## 2012-06-10 DIAGNOSIS — M899 Disorder of bone, unspecified: Secondary | ICD-10-CM | POA: Diagnosis not present

## 2012-06-10 DIAGNOSIS — R739 Hyperglycemia, unspecified: Secondary | ICD-10-CM

## 2012-06-10 DIAGNOSIS — E039 Hypothyroidism, unspecified: Secondary | ICD-10-CM | POA: Diagnosis not present

## 2012-06-10 LAB — CBC WITH DIFFERENTIAL/PLATELET
Basophils Absolute: 0 10*3/uL (ref 0.0–0.1)
Basophils Relative: 0.5 % (ref 0.0–3.0)
Eosinophils Absolute: 0.3 10*3/uL (ref 0.0–0.7)
Eosinophils Relative: 3.5 % (ref 0.0–5.0)
HCT: 42.8 % (ref 36.0–46.0)
Hemoglobin: 14.1 g/dL (ref 12.0–15.0)
Lymphocytes Relative: 41.3 % (ref 12.0–46.0)
Lymphs Abs: 3.2 10*3/uL (ref 0.7–4.0)
MCHC: 33 g/dL (ref 30.0–36.0)
MCV: 95.2 fl (ref 78.0–100.0)
Monocytes Absolute: 0.5 10*3/uL (ref 0.1–1.0)
Monocytes Relative: 7 % (ref 3.0–12.0)
Neutro Abs: 3.7 10*3/uL (ref 1.4–7.7)
Neutrophils Relative %: 47.7 % (ref 43.0–77.0)
Platelets: 266 10*3/uL (ref 150.0–400.0)
RBC: 4.49 Mil/uL (ref 3.87–5.11)
RDW: 13.1 % (ref 11.5–14.6)
WBC: 7.7 10*3/uL (ref 4.5–10.5)

## 2012-06-10 LAB — COMPREHENSIVE METABOLIC PANEL
ALT: 19 U/L (ref 0–35)
AST: 22 U/L (ref 0–37)
Albumin: 3.5 g/dL (ref 3.5–5.2)
Alkaline Phosphatase: 61 U/L (ref 39–117)
BUN: 16 mg/dL (ref 6–23)
CO2: 26 mEq/L (ref 19–32)
Calcium: 9.4 mg/dL (ref 8.4–10.5)
Chloride: 105 mEq/L (ref 96–112)
Creatinine, Ser: 1 mg/dL (ref 0.4–1.2)
GFR: 56.46 mL/min — ABNORMAL LOW (ref >60.00)
Glucose, Bld: 84 mg/dL (ref 70–99)
Potassium: 4.4 mEq/L (ref 3.5–5.1)
Sodium: 138 mEq/L (ref 135–145)
Total Bilirubin: 0.7 mg/dL (ref 0.3–1.2)
Total Protein: 6.8 g/dL (ref 6.0–8.3)

## 2012-06-10 LAB — LIPID PANEL
Cholesterol: 195 mg/dL (ref 0–200)
HDL: 63.5 mg/dL (ref >39.00)
LDL Cholesterol: 110 mg/dL — ABNORMAL HIGH (ref 0–99)
Total CHOL/HDL Ratio: 3
Triglycerides: 110 mg/dL (ref 0.0–149.0)
VLDL: 22 mg/dL (ref 0.0–40.0)

## 2012-06-10 LAB — TSH: TSH: 7.26 u[IU]/mL — ABNORMAL HIGH (ref 0.35–5.50)

## 2012-06-10 LAB — HEMOGLOBIN A1C: Hgb A1c MFr Bld: 5.4 % (ref 4.6–6.5)

## 2012-06-11 LAB — VITAMIN D 25 HYDROXY (VIT D DEFICIENCY, FRACTURES): Vit D, 25-Hydroxy: 55 ng/mL (ref 30–89)

## 2012-06-15 ENCOUNTER — Encounter: Payer: Self-pay | Admitting: Family Medicine

## 2012-06-15 ENCOUNTER — Ambulatory Visit (INDEPENDENT_AMBULATORY_CARE_PROVIDER_SITE_OTHER): Payer: Medicare Other | Admitting: Family Medicine

## 2012-06-15 VITALS — BP 140/86 | HR 82 | Temp 97.8°F | Ht 63.0 in | Wt 204.2 lb

## 2012-06-15 DIAGNOSIS — I10 Essential (primary) hypertension: Secondary | ICD-10-CM

## 2012-06-15 DIAGNOSIS — R739 Hyperglycemia, unspecified: Secondary | ICD-10-CM

## 2012-06-15 DIAGNOSIS — Z1231 Encounter for screening mammogram for malignant neoplasm of breast: Secondary | ICD-10-CM | POA: Insufficient documentation

## 2012-06-15 DIAGNOSIS — E785 Hyperlipidemia, unspecified: Secondary | ICD-10-CM | POA: Diagnosis not present

## 2012-06-15 DIAGNOSIS — E039 Hypothyroidism, unspecified: Secondary | ICD-10-CM | POA: Diagnosis not present

## 2012-06-15 DIAGNOSIS — M949 Disorder of cartilage, unspecified: Secondary | ICD-10-CM

## 2012-06-15 DIAGNOSIS — M899 Disorder of bone, unspecified: Secondary | ICD-10-CM | POA: Diagnosis not present

## 2012-06-15 DIAGNOSIS — R7309 Other abnormal glucose: Secondary | ICD-10-CM

## 2012-06-15 DIAGNOSIS — E559 Vitamin D deficiency, unspecified: Secondary | ICD-10-CM

## 2012-06-15 MED ORDER — ESTROGENS, CONJUGATED 0.625 MG/GM VA CREA: TOPICAL_CREAM | VAGINAL | Status: DC

## 2012-06-15 MED ORDER — LEVOTHYROXINE SODIUM 88 MCG PO TABS: 88.0000 ug | ORAL_TABLET | Freq: Every day | ORAL | Status: DC

## 2012-06-15 NOTE — Assessment & Plan Note (Signed)
Scheduled annual screening mammogram Nl breast exam today  Encouraged monthly self exams   

## 2012-06-15 NOTE — Assessment & Plan Note (Signed)
bp in fair control at this time  No changes needed  Disc lifstyle change with low sodium diet and exercise  Rev labs with pt today 

## 2012-06-15 NOTE — Assessment & Plan Note (Signed)
Diet controlled Lab Results  Component Value Date   HGBA1C 5.4 06/10/2012    Enc wt loss and low glycemic diet

## 2012-06-15 NOTE — Assessment & Plan Note (Signed)
Good level Will continue current supplementation

## 2012-06-15 NOTE — Progress Notes (Signed)
Subjective:    Patient ID: Linda Mcgrath, female    DOB: 1933-07-20, 78 y.o.   MRN: [id number]  HPI Here for check up of chronic medical conditions and to review health mt list  Doing pretty well overall  Nothing new going on  occ sinus problems - ok now   bp is stable today  No cp or palpitations or headaches or edema  No side effects to medicines  BP Readings from Last 3 Encounters:  06/15/12 140/86  12/04/11 110/70  10/09/11 120/86      Wt is up 1 lb with bmi of 36 Health habits- she is trying to eat more fruit and fresh veg- less red meat Does make an effort to eat protein - chicken breast   Hyperglycemia  Lab Results  Component Value Date   HGBA1C 5.4 06/10/2012   well controlled    Lipids  Lab Results  Component Value Date   CHOL 195 06/10/2012   CHOL 205* 11/27/2011   CHOL 215* 11/27/2010   Lab Results  Component Value Date   HDL 63.50 06/10/2012   HDL 74.90 11/27/2011   HDL 76.60 11/27/2010   Lab Results  Component Value Date   LDLCALC 110* 06/10/2012   Lab Results  Component Value Date   TRIG 110.0 06/10/2012   TRIG 82.0 11/27/2011   TRIG 100.0 11/27/2010   Lab Results  Component Value Date   CHOLHDL 3 06/10/2012   CHOLHDL 3 11/27/2011   CHOLHDL 3 11/27/2010   Lab Results  Component Value Date   LDLDIRECT 124.0 11/27/2011   LDLDIRECT 127.3 11/27/2010   LDLDIRECT 129.6 06/22/2009  has improved with better diet- less red meat    Osteopenia  dexa 09 She is interested in another dexa  Vit D level 55- does take her ca and D No falls or fractures  Hypothyroid Hypothyroidism  Pt has no clinical changes No change in energy level/ hair or skin/ edema and no tremor (but does have some vaginal dryness) Lab Results  Component Value Date   TSH 7.26* 06/10/2012    No missed doses  Will inc dose today   Is ready to come off estrogen pills , (at least a try )  Wants to try estrogen cream   Flu vaccine- had the quadrivalent vaccine   colonosc 5/09-  will be due 5/14 She has seen Dr Marina Goodell about her diverticulitis in the meantime - is stable  fam hx and personal hx of polyps  Hysterectomy in past - it was for fibroids No hx of abn paps   mammo per pt - was 2 y ago  Needs to schedule that  Self exam-no lumps   Patient Active Problem List  Diagnosis  . HYPOTHYROIDISM  . UNSPECIFIED VITAMIN D DEFICIENCY  . HYPERLIPIDEMIA  . ALLERGIC RHINITIS  . IBS  . MENOPAUSAL SYNDROME  . OSTEOARTHRITIS  . FIBROMYALGIA  . OSTEOPENIA  . OSTEOARTHRITIS, HANDS, BILATERAL  . HYPERTENSION, BENIGN ESSENTIAL  . CONSTIPATION  . HEMATOCHEZIA  . Leg pain  . Hyperglycemia  . Conjunctival hemorrhage of right eye   Past Medical History  Diagnosis Date  . Osteoarthritis   . Fibromyalgia   . Hypertension   . Hypothyroidism   . Hyperlipidemia   . Internal hemorrhoids   . Colon polyp 1999    small polyp  . Diverticulosis   . Lung nodule     stable LUL 9 mm  . Cataract   . Allergic rhinitis   . Leg  cramps   . Menopausal syndrome   . UTI (urinary tract infection)    Past Surgical History  Procedure Date  . Abdominal hysterectomy     total-fibroids  . Rotator cuff repair     x 2  . Skin cancer excision     pre-melanoma  . Breast biopsy     benign  . Cataract extraction     bilateral  . Abd u/s 11/1998    negative  . Abd u/s 01/2001    gallbladder polyps  . Hida scan 01/2001    Negative  . Colonoscopy 1998    Diverticulosis; polyp\  . Dexa 10/2001    osteopenia  . Esophagogastroduodenoscopy 2004  . Colonoscopy 09/2002    Diverticulosis, hem  . Colonoscopy 12/2007    diverticulosis, polyp  . Appendectomy 1975  . Cholecystectomy    History  Substance Use Topics  . Smoking status: Never Smoker   . Smokeless tobacco: Never Used  . Alcohol Use: No     occasional   Family History  Problem Relation Age of Onset  . Parkinsonism Father   . Colon cancer Brother   . Kidney failure Son     congenital  . Allergies Mother     Allergies  Allergen Reactions  . Amlodipine Besylate     REACTION: muscle spasms, extremity swelling, low bp  . Amoxicillin   . Ciprofloxacin     REACTION: muscle spasms, insomnia  . Codeine     REACTION: hallucinations  . Penicillins   . Propoxyphene Hcl   . Sulfamethoxazole W-Trimethoprim     REACTION: dizzy, could not focus eyes and could not concentrate and nausea  . Tetracycline     REACTION: heart palpitations,muscle spasms   Current Outpatient Prescriptions on File Prior to Visit  Medication Sig Dispense Refill  . Ascorbic Acid (VITAMIN C) 1000 MG tablet Take 1,000 mg by mouth daily.        . Biotin 10 MG TABS Take by mouth. daily       . Calcium Carbonate-Vit D-Min 600-400 MG-UNIT TABS Take by mouth. daily       . Cholecalciferol (VITAMIN D) 2000 UNITS CAPS Take 1 capsule by mouth 2 (two) times daily.        . Cranberry 500 MG CAPS Take by mouth. daily       . cyclobenzaprine (FLEXERIL) 10 MG tablet TAKE ONE TABLET BY MOUTH NIGHTLY AT BEDTIME AS NEEDED  30 tablet  5  . estradiol (ESTRACE) 1 MG tablet Take 1 tablet (1 mg total) by mouth once.  30 tablet  3  . Flax OIL Take by mouth. daily       . fluticasone (FLONASE) 50 MCG/ACT nasal spray USE TWO SPRAYS IN EACH NOSTRIL DAILY  16 g  10  . guaiFENesin (MUCINEX) 600 MG 12 hr tablet OTC as directed       . ibuprofen (ADVIL,MOTRIN) 200 MG tablet Take 400 mg by mouth 2 (two) times daily as needed.        Marland Kitchen lisinopril (PRINIVIL,ZESTRIL) 10 MG tablet TAKE ONE TABLET BY MOUTH EVERY DAY IN THE MORNING.  30 tablet  11  . loratadine (CLARITIN) 10 MG tablet Take 10 mg by mouth daily.      . Lutein 20 MG CAPS Take by mouth. daily       . Multiple Vitamin (MULTIVITAMIN) capsule Take 1 capsule by mouth daily.        Marland Kitchen SYNTHROID 75 MCG tablet TAKE ONE  TABLET BY MOUTH ONE TIME DAILY  30 each  11        Review of Systems Review of Systems  Constitutional: Negative for fever, appetite change, fatigue and unexpected weight change.   Eyes: Negative for pain and visual disturbance.  ENT pos for occ sinus cong without pain  Respiratory: Negative for cough and shortness of breath.   Cardiovascular: Negative for cp or palpitations    Gastrointestinal: Negative for nausea, diarrhea and constipation.  Genitourinary: Negative for urgency and frequency.  Skin: Negative for pallor or rash   Neurological: Negative for weakness, light-headedness, numbness and headaches.  Hematological: Negative for adenopathy. Does not bruise/bleed easily.  Psychiatric/Behavioral: Negative for dysphoric mood. The patient is not nervous/anxious.         Objective:   Physical Exam  Constitutional: She appears well-developed and well-nourished. No distress.       obese and well appearing   HENT:  Head: Normocephalic and atraumatic.  Right Ear: External ear normal.  Left Ear: External ear normal.  Nose: Nose normal.  Mouth/Throat: Oropharynx is clear and moist. No oropharyngeal exudate.  Eyes: Conjunctivae normal and EOM are normal. Pupils are equal, round, and reactive to light. Right eye exhibits no discharge. Left eye exhibits no discharge. No scleral icterus.  Neck: Normal range of motion. Neck supple. No JVD present. Carotid bruit is not present. No thyromegaly present.  Cardiovascular: Normal rate, regular rhythm, normal heart sounds and intact distal pulses.  Exam reveals no gallop.   Pulmonary/Chest: Effort normal and breath sounds normal. No respiratory distress. She has no wheezes.  Abdominal: Soft. Bowel sounds are normal. She exhibits no distension, no abdominal bruit and no mass. There is no tenderness.  Genitourinary: No breast swelling, tenderness, discharge or bleeding.       Breast exam: No mass, nodules, thickening, tenderness, bulging, retraction, inflamation, nipple discharge or skin changes noted.  No axillary or clavicular LA.  Chaperoned exam.    Musculoskeletal: Normal range of motion. She exhibits no edema and no  tenderness.  Lymphadenopathy:    She has no cervical adenopathy.  Neurological: She is alert. She has normal reflexes. No cranial nerve deficit. She exhibits normal muscle tone. Coordination normal.  Skin: Skin is warm and dry. No rash noted. No erythema. No pallor.  Psychiatric: She has a normal mood and affect.          Assessment & Plan:

## 2012-06-15 NOTE — Patient Instructions (Addendum)
We will refer you for mammogram and dexa at check out  Increase synthroid to 88 mcg daily Schedule non fasting labs in 6 weeks Follow up with me in 6 months  Stop estrogen pills and try the vaginal cream  Keep working on healthy diet and exercise

## 2012-06-15 NOTE — Assessment & Plan Note (Signed)
Lipids rev- some imp Disc goals for lipids and reasons to control them Rev labs with pt Rev low sat fat diet in detail

## 2012-06-15 NOTE — Assessment & Plan Note (Signed)
Due for dexa sched that D level good Takes supplements- and no fragility fractures

## 2012-06-15 NOTE — Assessment & Plan Note (Signed)
tsh is up with no missed doses Inc dose to 88 mcg No symptoms  Re check tsh in 6 wk F/u 6 mo

## 2012-07-19 ENCOUNTER — Other Ambulatory Visit: Payer: Self-pay | Admitting: Family Medicine

## 2012-07-20 ENCOUNTER — Ambulatory Visit
Admission: RE | Admit: 2012-07-20 | Discharge: 2012-07-20 | Disposition: A | Discharge status: Home / Self Care | Payer: Medicare Other | Source: Ambulatory Visit | Attending: Family Medicine | Admitting: Family Medicine

## 2012-07-20 DIAGNOSIS — M899 Disorder of bone, unspecified: Secondary | ICD-10-CM

## 2012-07-20 DIAGNOSIS — Z1231 Encounter for screening mammogram for malignant neoplasm of breast: Secondary | ICD-10-CM | POA: Diagnosis not present

## 2012-07-20 DIAGNOSIS — M949 Disorder of cartilage, unspecified: Secondary | ICD-10-CM | POA: Diagnosis not present

## 2012-07-20 LAB — HM DEXA SCAN

## 2012-07-20 NOTE — Telephone Encounter (Signed)
Please give her 6 months of refils, thanks 

## 2012-07-20 NOTE — Telephone Encounter (Signed)
Ok to refill 

## 2012-07-21 DIAGNOSIS — M19019 Primary osteoarthritis, unspecified shoulder: Secondary | ICD-10-CM | POA: Diagnosis not present

## 2012-07-22 ENCOUNTER — Encounter: Payer: Self-pay | Admitting: Family Medicine

## 2012-07-22 ENCOUNTER — Encounter: Payer: Self-pay | Admitting: *Deleted

## 2012-07-22 ENCOUNTER — Other Ambulatory Visit: Payer: Self-pay | Admitting: Family Medicine

## 2012-07-27 ENCOUNTER — Other Ambulatory Visit (INDEPENDENT_AMBULATORY_CARE_PROVIDER_SITE_OTHER): Payer: Medicare Other

## 2012-07-27 DIAGNOSIS — E039 Hypothyroidism, unspecified: Secondary | ICD-10-CM

## 2012-07-27 LAB — TSH: TSH: 3 u[IU]/mL (ref 0.35–5.50)

## 2012-07-28 ENCOUNTER — Other Ambulatory Visit: Payer: Self-pay | Admitting: *Deleted

## 2012-08-12 DIAGNOSIS — M545 Low back pain, unspecified: Secondary | ICD-10-CM | POA: Diagnosis not present

## 2012-08-12 DIAGNOSIS — M9981 Other biomechanical lesions of cervical region: Secondary | ICD-10-CM | POA: Diagnosis not present

## 2012-08-12 DIAGNOSIS — M999 Biomechanical lesion, unspecified: Secondary | ICD-10-CM | POA: Diagnosis not present

## 2012-08-14 DIAGNOSIS — M999 Biomechanical lesion, unspecified: Secondary | ICD-10-CM | POA: Diagnosis not present

## 2012-08-14 DIAGNOSIS — M545 Low back pain, unspecified: Secondary | ICD-10-CM | POA: Diagnosis not present

## 2012-08-14 DIAGNOSIS — M9981 Other biomechanical lesions of cervical region: Secondary | ICD-10-CM | POA: Diagnosis not present

## 2012-08-17 DIAGNOSIS — M545 Low back pain, unspecified: Secondary | ICD-10-CM | POA: Diagnosis not present

## 2012-08-17 DIAGNOSIS — M9981 Other biomechanical lesions of cervical region: Secondary | ICD-10-CM | POA: Diagnosis not present

## 2012-08-17 DIAGNOSIS — M999 Biomechanical lesion, unspecified: Secondary | ICD-10-CM | POA: Diagnosis not present

## 2012-08-20 ENCOUNTER — Encounter: Payer: Self-pay | Admitting: Family Medicine

## 2012-08-20 ENCOUNTER — Ambulatory Visit (INDEPENDENT_AMBULATORY_CARE_PROVIDER_SITE_OTHER): Payer: Medicare Other | Admitting: Family Medicine

## 2012-08-20 VITALS — BP 142/82 | HR 74 | Temp 97.8°F | Ht 63.0 in | Wt 202.0 lb

## 2012-08-20 DIAGNOSIS — J029 Acute pharyngitis, unspecified: Secondary | ICD-10-CM

## 2012-08-20 DIAGNOSIS — J020 Streptococcal pharyngitis: Secondary | ICD-10-CM | POA: Diagnosis not present

## 2012-08-20 LAB — POCT RAPID STREP A (OFFICE): Rapid Strep A Screen: POSITIVE — AB

## 2012-08-20 MED ORDER — BENZONATATE 200 MG PO CAPS: 200.0000 mg | ORAL_CAPSULE | Freq: Three times a day (TID) | ORAL | Status: DC | PRN

## 2012-08-20 MED ORDER — AZITHROMYCIN 250 MG PO TABS: ORAL_TABLET | ORAL | Status: DC

## 2012-08-20 NOTE — Assessment & Plan Note (Signed)
Pt also has uri symptoms/ cough Will cover with zpak- mult med allergies Disc symptomatic care - see instructions on AVS - also tessalon for cough  Update if not starting to improve in a week or if worsening  Disc symptomatic care - see instructions on AVS

## 2012-08-20 NOTE — Patient Instructions (Addendum)
Drink lots of fluids Get rest  Take zithromax as directed for strep throat  Tylenol for sore throat and fever Try mucinex  DM for cough and congestion and also tessalon for cough as needed  Update if not starting to improve in a week or if worsening

## 2012-08-20 NOTE — Progress Notes (Signed)
Subjective:    Patient ID: Linda Mcgrath, female    DOB: 10-04-1932, 78 y.o.   MRN: [id number]  HPI Here with uri symptoms and sore throat  Woke up at 2 am - bad ST and a bad cough Taking mucinex plain   Not a fever at home but legs were achey   Rapid strep test today is faintly pos   Prod cough- thick green mess Is also stuffy and hoarse   Patient Active Problem List  Diagnosis  . HYPOTHYROIDISM  . UNSPECIFIED VITAMIN D DEFICIENCY  . HYPERLIPIDEMIA  . ALLERGIC RHINITIS  . IBS  . MENOPAUSAL SYNDROME  . OSTEOARTHRITIS  . FIBROMYALGIA  . OSTEOPENIA  . OSTEOARTHRITIS, HANDS, BILATERAL  . HYPERTENSION, BENIGN ESSENTIAL  . CONSTIPATION  . HEMATOCHEZIA  . Leg pain  . Hyperglycemia  . Conjunctival hemorrhage of right eye  . Other screening mammogram   Past Medical History  Diagnosis Date  . Osteoarthritis   . Fibromyalgia   . Hypertension   . Hypothyroidism   . Hyperlipidemia   . Internal hemorrhoids   . Colon polyp 1999    small polyp  . Diverticulosis   . Lung nodule     stable LUL 9 mm  . Cataract   . Allergic rhinitis   . Leg cramps   . Menopausal syndrome   . UTI (urinary tract infection)    Past Surgical History  Procedure Date  . Abdominal hysterectomy     total-fibroids  . Rotator cuff repair     x 2  . Skin cancer excision     pre-melanoma  . Breast biopsy     benign  . Cataract extraction     bilateral  . Abd u/s 11/1998    negative  . Abd u/s 01/2001    gallbladder polyps  . Hida scan 01/2001    Negative  . Colonoscopy 1998    Diverticulosis; polyp\  . Dexa 10/2001    osteopenia  . Esophagogastroduodenoscopy 2004  . Colonoscopy 09/2002    Diverticulosis, hem  . Colonoscopy 12/2007    diverticulosis, polyp  . Appendectomy 1975  . Cholecystectomy    History  Substance Use Topics  . Smoking status: Never Smoker   . Smokeless tobacco: Never Used  . Alcohol Use: No     Comment: occasional   Family History  Problem  Relation Age of Onset  . Parkinsonism Father   . Colon cancer Brother   . Kidney failure Son     congenital  . Allergies Mother    Allergies  Allergen Reactions  . Amlodipine Besylate     REACTION: muscle spasms, extremity swelling, low bp  . Amoxicillin   . Ciprofloxacin     REACTION: muscle spasms, insomnia  . Codeine     REACTION: hallucinations  . Penicillins   . Propoxyphene Hcl   . Sulfamethoxazole W-Trimethoprim     REACTION: dizzy, could not focus eyes and could not concentrate and nausea  . Tetracycline     REACTION: heart palpitations,muscle spasms   Current Outpatient Prescriptions on File Prior to Visit  Medication Sig Dispense Refill  . Ascorbic Acid (VITAMIN C) 1000 MG tablet Take 1,000 mg by mouth daily.        . B Complex Vitamins (VITAMIN B COMPLEX PO) Take 1 tablet by mouth daily.      . Biotin 10 MG TABS Take by mouth. daily       . Calcium Carbonate-Vit D-Min 600-400 MG-UNIT TABS  Take by mouth. daily       . Cholecalciferol (VITAMIN D) 2000 UNITS CAPS Take 1 capsule by mouth 2 (two) times daily.        . Coenzyme Q10 (CO Q 10) 100 MG CAPS Take 1 tablet by mouth daily.      Marland Kitchen conjugated estrogens (PREMARIN) vaginal cream Insert 1/4 applicator intravaginally twice weekly  30 g  12  . Cranberry 500 MG CAPS Take by mouth. daily       . cyclobenzaprine (FLEXERIL) 10 MG tablet TAKE ONE TABLET BY MOUTH NIGHTLY AT BEDTIME AS NEEDED  30 tablet  5  . estradiol (ESTRACE) 1 MG tablet TAKE ONE TABLET BY MOUTH ONE TIME DAILY  30 tablet  4  . Flax OIL Take by mouth. daily       . fluticasone (FLONASE) 50 MCG/ACT nasal spray USE TWO SPRAYS IN EACH NOSTRIL DAILY  16 g  10  . ibuprofen (ADVIL,MOTRIN) 200 MG tablet Take 400 mg by mouth 2 (two) times daily as needed.        Marland Kitchen levothyroxine (SYNTHROID, LEVOTHROID) 88 MCG tablet Take 1 tablet (88 mcg total) by mouth daily.  90 tablet  3  . lisinopril (PRINIVIL,ZESTRIL) 10 MG tablet TAKE ONE TABLET BY MOUTH EVERY DAY IN THE  MORNING.  30 tablet  11  . loratadine (CLARITIN) 10 MG tablet Take 10 mg by mouth as needed.       . Lutein 20 MG CAPS Take by mouth. daily       . Multiple Vitamin (MULTIVITAMIN) capsule Take 1 capsule by mouth daily.        . vitamin B-12 (CYANOCOBALAMIN) 500 MCG tablet Take 500 mcg by mouth daily.      Marland Kitchen guaiFENesin (MUCINEX) 600 MG 12 hr tablet OTC as directed           Review of Systems Review of Systems  Constitutional: Negative for fever, appetite change,  and unexpected weight change. pos for fatigue ENT pos for cong and ST and drip, neg for sinus pain  Eyes: Negative for pain and visual disturbance.  Respiratory: Negative for wheeze and shortness of breath.   Cardiovascular: Negative for cp or palpitations    Gastrointestinal: Negative for nausea, diarrhea and constipation.  Genitourinary: Negative for urgency and frequency.  Skin: Negative for pallor or rash   Neurological: Negative for weakness, light-headedness, numbness and headaches.  Hematological: Negative for adenopathy. Does not bruise/bleed easily.  Psychiatric/Behavioral: Negative for dysphoric mood. The patient is not nervous/anxious.         Objective:   Physical Exam  Constitutional: She appears well-developed and well-nourished. No distress.  HENT:  Head: Normocephalic and atraumatic.  Right Ear: External ear normal.  Left Ear: External ear normal.  Mouth/Throat: Oropharynx is clear and moist. No oropharyngeal exudate.       Nares are injected and congested  No sinus tenderness Throat is injected without swelling or exudate   Eyes: Conjunctivae normal and EOM are normal. Pupils are equal, round, and reactive to light. Right eye exhibits no discharge. Left eye exhibits no discharge.  Neck: Normal range of motion. Neck supple.  Cardiovascular: Normal rate and regular rhythm.   Pulmonary/Chest: Effort normal and breath sounds normal. No respiratory distress. She has no wheezes. She has no rales. She  exhibits no tenderness.       Harsh bs , few rhonchi isolated   Musculoskeletal: She exhibits no edema.  Lymphadenopathy:    She has no  cervical adenopathy.  Neurological: She is alert. She has normal reflexes.  Skin: Skin is warm and dry. No rash noted.  Psychiatric: She has a normal mood and affect.          Assessment & Plan:

## 2012-08-24 DIAGNOSIS — M9981 Other biomechanical lesions of cervical region: Secondary | ICD-10-CM | POA: Diagnosis not present

## 2012-08-24 DIAGNOSIS — M999 Biomechanical lesion, unspecified: Secondary | ICD-10-CM | POA: Diagnosis not present

## 2012-08-24 DIAGNOSIS — M545 Low back pain, unspecified: Secondary | ICD-10-CM | POA: Diagnosis not present

## 2012-08-25 DIAGNOSIS — S7000XA Contusion of unspecified hip, initial encounter: Secondary | ICD-10-CM | POA: Diagnosis not present

## 2012-08-25 DIAGNOSIS — M19019 Primary osteoarthritis, unspecified shoulder: Secondary | ICD-10-CM | POA: Diagnosis not present

## 2012-09-19 ENCOUNTER — Other Ambulatory Visit: Payer: Self-pay | Admitting: Family Medicine

## 2012-10-05 ENCOUNTER — Encounter: Payer: Self-pay | Admitting: Family Medicine

## 2012-10-05 ENCOUNTER — Ambulatory Visit (INDEPENDENT_AMBULATORY_CARE_PROVIDER_SITE_OTHER): Payer: Medicare Other | Admitting: Family Medicine

## 2012-10-05 VITALS — BP 128/74 | HR 75 | Temp 98.7°F | Ht 63.0 in | Wt 202.8 lb

## 2012-10-05 DIAGNOSIS — A499 Bacterial infection, unspecified: Secondary | ICD-10-CM

## 2012-10-05 DIAGNOSIS — J209 Acute bronchitis, unspecified: Secondary | ICD-10-CM | POA: Diagnosis not present

## 2012-10-05 DIAGNOSIS — J208 Acute bronchitis due to other specified organisms: Secondary | ICD-10-CM

## 2012-10-05 DIAGNOSIS — B9689 Other specified bacterial agents as the cause of diseases classified elsewhere: Secondary | ICD-10-CM

## 2012-10-05 MED ORDER — HYDROCODONE-HOMATROPINE 5-1.5 MG/5ML PO SYRP: 5.0000 mL | ORAL_SOLUTION | Freq: Three times a day (TID) | ORAL | Status: DC | PRN

## 2012-10-05 MED ORDER — ESTRADIOL 1 MG PO TABS: 1.0000 mg | ORAL_TABLET | Freq: Every day | ORAL | Status: DC

## 2012-10-05 MED ORDER — AZITHROMYCIN 250 MG PO TABS: ORAL_TABLET | ORAL | Status: DC

## 2012-10-05 NOTE — Assessment & Plan Note (Signed)
Cover with zpak in light of abrupt onset of prod cough Disc symptomatic care - see instructions on AVS  Trial of hycodan cough syrup -pt thinks she tolerates this  Fluids/ rest Update if not starting to improve in a week or if worsening   No wheeze on today's exam

## 2012-10-05 NOTE — Progress Notes (Signed)
Subjective:    Patient ID: Linda Mcgrath, female    DOB: February 09, 1933, 79 y.o.   MRN: [id number]  HPI Here with uri symptoms  Started mid last week - and had an uncontrollable cough with some wheeze  Prod of sputum green in color  Hoarse voice  Headache all day yesterday with some sinus pressure  Stuffy nose  Throat ok  Ears stopped up   No fever or aches or chills   Patient Active Problem List  Diagnosis  . HYPOTHYROIDISM  . UNSPECIFIED VITAMIN D DEFICIENCY  . HYPERLIPIDEMIA  . ALLERGIC RHINITIS  . IBS  . MENOPAUSAL SYNDROME  . OSTEOARTHRITIS  . FIBROMYALGIA  . OSTEOPENIA  . OSTEOARTHRITIS, HANDS, BILATERAL  . HYPERTENSION, BENIGN ESSENTIAL  . CONSTIPATION  . HEMATOCHEZIA  . Leg pain  . Hyperglycemia  . Conjunctival hemorrhage of right eye  . Other screening mammogram  . Strep throat   Past Medical History  Diagnosis Date  . Osteoarthritis   . Fibromyalgia   . Hypertension   . Hypothyroidism   . Hyperlipidemia   . Internal hemorrhoids   . Colon polyp 1999    small polyp  . Diverticulosis   . Lung nodule     stable LUL 9 mm  . Cataract   . Allergic rhinitis   . Leg cramps   . Menopausal syndrome   . UTI (urinary tract infection)    Past Surgical History  Procedure Laterality Date  . Abdominal hysterectomy      total-fibroids  . Rotator cuff repair      x 2  . Skin cancer excision      pre-melanoma  . Breast biopsy      benign  . Cataract extraction      bilateral  . Abd u/s  11/1998    negative  . Abd u/s  01/2001    gallbladder polyps  . Hida scan  01/2001    Negative  . Colonoscopy  1998    Diverticulosis; polyp\  . Dexa  10/2001    osteopenia  . Esophagogastroduodenoscopy  2004  . Colonoscopy  09/2002    Diverticulosis, hem  . Colonoscopy  12/2007    diverticulosis, polyp  . Appendectomy  1975  . Cholecystectomy     History  Substance Use Topics  . Smoking status: Never Smoker   . Smokeless tobacco: Never Used  . Alcohol  Use: No     Comment: occasional   Family History  Problem Relation Age of Onset  . Parkinsonism Father   . Colon cancer Brother   . Kidney failure Son     congenital  . Allergies Mother    Allergies  Allergen Reactions  . Amlodipine Besylate     REACTION: muscle spasms, extremity swelling, low bp  . Amoxicillin   . Ciprofloxacin     REACTION: muscle spasms, insomnia  . Codeine     REACTION: hallucinations  . Penicillins   . Propoxyphene Hcl   . Sulfamethoxazole W-Trimethoprim     REACTION: dizzy, could not focus eyes and could not concentrate and nausea  . Tetracycline     REACTION: heart palpitations,muscle spasms   Current Outpatient Prescriptions on File Prior to Visit  Medication Sig Dispense Refill  . Ascorbic Acid (VITAMIN C) 1000 MG tablet Take 1,000 mg by mouth daily.        . B Complex Vitamins (VITAMIN B COMPLEX PO) Take 1 tablet by mouth daily.      . benzonatate (  TESSALON) 200 MG capsule Take 1 capsule (200 mg total) by mouth 3 (three) times daily as needed for cough.  30 capsule  1  . Biotin 10 MG TABS Take by mouth. daily       . Calcium Carbonate-Vit D-Min 600-400 MG-UNIT TABS Take by mouth. daily       . Cholecalciferol (VITAMIN D) 2000 UNITS CAPS Take 1 capsule by mouth 2 (two) times daily.        . Coenzyme Q10 (CO Q 10) 100 MG CAPS Take 1 tablet by mouth daily.      Marland Kitchen conjugated estrogens (PREMARIN) vaginal cream Insert 1/4 applicator intravaginally twice weekly  30 g  12  . Cranberry 500 MG CAPS Take by mouth. daily       . cyclobenzaprine (FLEXERIL) 10 MG tablet TAKE ONE TABLET BY MOUTH NIGHTLY AT BEDTIME AS NEEDED  30 tablet  5  . estradiol (ESTRACE) 1 MG tablet TAKE ONE TABLET BY MOUTH ONE TIME DAILY  30 tablet  4  . Flax OIL Take by mouth. daily       . fluticasone (FLONASE) 50 MCG/ACT nasal spray USE TWO SPRAYS IN EACH NOSTRIL DAILY  16 g  10  . ibuprofen (ADVIL,MOTRIN) 200 MG tablet Take 400 mg by mouth 2 (two) times daily as needed.        Marland Kitchen  levothyroxine (SYNTHROID, LEVOTHROID) 88 MCG tablet Take 1 tablet (88 mcg total) by mouth daily.  90 tablet  3  . lisinopril (PRINIVIL,ZESTRIL) 10 MG tablet TAKE ONE TABLET BY MOUTH EVERY DAY IN THE MORNING.  30 tablet  2  . loratadine (CLARITIN) 10 MG tablet Take 10 mg by mouth as needed.       . Lutein 20 MG CAPS Take by mouth. daily       . Multiple Vitamin (MULTIVITAMIN) capsule Take 1 capsule by mouth daily.        . vitamin B-12 (CYANOCOBALAMIN) 500 MCG tablet Take 500 mcg by mouth daily.       No current facility-administered medications on file prior to visit.      Review of Systems    Review of Systems  Constitutional: Negative for fever, appetite change,  and unexpected weight change.  ENT neg for ear pain or drainage / neg for sinus pain  Eyes: Negative for pain and visual disturbance.  Respiratory: Negative for shortness of breath.   Cardiovascular: Negative for cp or palpitations    Gastrointestinal: Negative for nausea, diarrhea and constipation.  Genitourinary: Negative for urgency and frequency.  Skin: Negative for pallor or rash   Neurological: Negative for weakness, light-headedness, numbness and headaches.  Hematological: Negative for adenopathy. Does not bruise/bleed easily.  Psychiatric/Behavioral: Negative for dysphoric mood. The patient is not nervous/anxious.      Objective:   Physical Exam  Constitutional: She appears well-developed and well-nourished. No distress.  HENT:  Head: Normocephalic and atraumatic.  Right Ear: External ear normal.  Left Ear: External ear normal.  Mouth/Throat: Oropharynx is clear and moist. No oropharyngeal exudate.  Nares are injected and congested  Hoarse voice No sinus tenderness   Eyes: Conjunctivae and EOM are normal. Pupils are equal, round, and reactive to light. Right eye exhibits no discharge. Left eye exhibits no discharge. No scleral icterus.  Neck: Normal range of motion. Neck supple. No JVD present. Carotid bruit  is not present. No thyromegaly present.  Cardiovascular: Normal rate and regular rhythm.   Pulmonary/Chest: Effort normal. No respiratory distress. She has  wheezes. She has no rales. She exhibits no tenderness.  Harsh bs with wheeze on forced expiration No rales  Few isolated rhonchi  Musculoskeletal: She exhibits no edema.  Lymphadenopathy:    She has no cervical adenopathy.  Neurological: She is alert.  Skin: Skin is warm and dry. No rash noted.  Psychiatric: She has a normal mood and affect.          Assessment & Plan:

## 2012-10-05 NOTE — Patient Instructions (Addendum)
Drink lots of fluids and rest  Take zpak for bronchitis Try hycodan cough medicine with caution  Update if not starting to improve in a week or if worsening  (or if worse wheeze)

## 2012-10-12 ENCOUNTER — Ambulatory Visit (INDEPENDENT_AMBULATORY_CARE_PROVIDER_SITE_OTHER): Payer: Medicare Other | Admitting: Family Medicine

## 2012-10-12 ENCOUNTER — Encounter: Payer: Self-pay | Admitting: Family Medicine

## 2012-10-12 VITALS — BP 124/82 | HR 94 | Temp 98.0°F | Ht 63.0 in | Wt 200.5 lb

## 2012-10-12 DIAGNOSIS — B9689 Other specified bacterial agents as the cause of diseases classified elsewhere: Secondary | ICD-10-CM

## 2012-10-12 DIAGNOSIS — A499 Bacterial infection, unspecified: Secondary | ICD-10-CM

## 2012-10-12 DIAGNOSIS — J209 Acute bronchitis, unspecified: Secondary | ICD-10-CM

## 2012-10-12 DIAGNOSIS — J208 Acute bronchitis due to other specified organisms: Secondary | ICD-10-CM

## 2012-10-12 MED ORDER — AZITHROMYCIN 250 MG PO TABS: ORAL_TABLET | ORAL | Status: DC

## 2012-10-12 MED ORDER — ALBUTEROL SULFATE HFA 108 (90 BASE) MCG/ACT IN AERS: 2.0000 | INHALATION_SPRAY | RESPIRATORY_TRACT | Status: DC | PRN

## 2012-10-12 NOTE — Patient Instructions (Addendum)
Drink lots of fluids and rest  Use inhaler when needed Repeat course of zithromax  If worse or not improved - please let me know - especially if not starting to improve in a week

## 2012-10-12 NOTE — Assessment & Plan Note (Signed)
Ongoing symptoms - with cough and sinus congestion Pt is allergic/ intolerant to most abx and these were reviewed with her Pulse ox 95% and exam reassuring Will repeat zpak and also px albuterol hfa for prn use Update if not starting to improve in a week or if worsening

## 2012-10-12 NOTE — Progress Notes (Signed)
Subjective:    Patient ID: Linda Mcgrath, female    DOB: 11-04-1932, 79 y.o.   MRN: [id number]  HPI Here for f/u bronchitis that is not better  Seen 2/10 Dx with bacterial bronchitis and given zpak/ tessalon/ hycodan Hycodan cough med did not help and she can not afford anything stronger than that   She initially got worse- Thursday and Friday Started wheezing  Also bad cough - had to sleep in a recliner (green mucous)   Thinks she has a sinus infection also - bloody mucous from both nostrils Facial pain is off and on   Patient Active Problem List  Diagnosis  . HYPOTHYROIDISM  . UNSPECIFIED VITAMIN D DEFICIENCY  . HYPERLIPIDEMIA  . ALLERGIC RHINITIS  . IBS  . MENOPAUSAL SYNDROME  . OSTEOARTHRITIS  . FIBROMYALGIA  . OSTEOPENIA  . OSTEOARTHRITIS, HANDS, BILATERAL  . HYPERTENSION, BENIGN ESSENTIAL  . CONSTIPATION  . HEMATOCHEZIA  . Leg pain  . Hyperglycemia  . Conjunctival hemorrhage of right eye  . Other screening mammogram  . Strep throat  . Acute bronchitis, bacterial   Past Medical History  Diagnosis Date  . Osteoarthritis   . Fibromyalgia   . Hypertension   . Hypothyroidism   . Hyperlipidemia   . Internal hemorrhoids   . Colon polyp 1999    small polyp  . Diverticulosis   . Lung nodule     stable LUL 9 mm  . Cataract   . Allergic rhinitis   . Leg cramps   . Menopausal syndrome   . UTI (urinary tract infection)    Past Surgical History  Procedure Laterality Date  . Abdominal hysterectomy      total-fibroids  . Rotator cuff repair      x 2  . Skin cancer excision      pre-melanoma  . Breast biopsy      benign  . Cataract extraction      bilateral  . Abd u/s  11/1998    negative  . Abd u/s  01/2001    gallbladder polyps  . Hida scan  01/2001    Negative  . Colonoscopy  1998    Diverticulosis; polyp\  . Dexa  10/2001    osteopenia  . Esophagogastroduodenoscopy  2004  . Colonoscopy  09/2002    Diverticulosis, hem  . Colonoscopy   12/2007    diverticulosis, polyp  . Appendectomy  1975  . Cholecystectomy     History  Substance Use Topics  . Smoking status: Never Smoker   . Smokeless tobacco: Never Used  . Alcohol Use: No     Comment: occasional   Family History  Problem Relation Age of Onset  . Parkinsonism Father   . Colon cancer Brother   . Kidney failure Son     congenital  . Allergies Mother    Allergies  Allergen Reactions  . Amlodipine Besylate     REACTION: muscle spasms, extremity swelling, low bp  . Amoxicillin   . Ciprofloxacin     REACTION: muscle spasms, insomnia  . Codeine     REACTION: hallucinations  . Penicillins   . Propoxyphene Hcl   . Sulfamethoxazole W-Trimethoprim     REACTION: dizzy, could not focus eyes and could not concentrate and nausea  . Tetracycline     REACTION: heart palpitations,muscle spasms   Current Outpatient Prescriptions on File Prior to Visit  Medication Sig Dispense Refill  . Ascorbic Acid (VITAMIN C) 1000 MG tablet Take 1,000 mg by  mouth daily.        . B Complex Vitamins (VITAMIN B COMPLEX PO) Take 1 tablet by mouth daily.      . benzonatate (TESSALON) 200 MG capsule Take 1 capsule (200 mg total) by mouth 3 (three) times daily as needed for cough.  30 capsule  1  . Biotin 10 MG TABS Take by mouth. daily       . Calcium Carbonate-Vit D-Min 600-400 MG-UNIT TABS Take by mouth. daily       . Cholecalciferol (VITAMIN D) 2000 UNITS CAPS Take 1 capsule by mouth 2 (two) times daily.        . Coenzyme Q10 (CO Q 10) 100 MG CAPS Take 1 tablet by mouth daily.      . Cranberry 500 MG CAPS Take by mouth. daily       . cyclobenzaprine (FLEXERIL) 10 MG tablet TAKE ONE TABLET BY MOUTH NIGHTLY AT BEDTIME AS NEEDED  30 tablet  5  . estradiol (ESTRACE) 1 MG tablet Take 1 tablet (1 mg total) by mouth daily.  30 tablet  11  . Flax OIL Take by mouth. daily       . fluticasone (FLONASE) 50 MCG/ACT nasal spray USE TWO SPRAYS IN EACH NOSTRIL DAILY  16 g  10  .  guaiFENesin-dextromethorphan (ROBITUSSIN DM) 100-10 MG/5ML syrup Take 5 mLs by mouth 3 (three) times daily as needed for cough.      Marland Kitchen HYDROcodone-homatropine (HYCODAN) 5-1.5 MG/5ML syrup Take 5 mLs by mouth every 8 (eight) hours as needed for cough.  120 mL  0  . ibuprofen (ADVIL,MOTRIN) 200 MG tablet Take 400 mg by mouth 2 (two) times daily as needed.        Marland Kitchen levothyroxine (SYNTHROID, LEVOTHROID) 88 MCG tablet Take 1 tablet (88 mcg total) by mouth daily.  90 tablet  3  . lisinopril (PRINIVIL,ZESTRIL) 10 MG tablet TAKE ONE TABLET BY MOUTH EVERY DAY IN THE MORNING.  30 tablet  2  . loratadine (CLARITIN) 10 MG tablet Take 10 mg by mouth as needed.       . Lutein 20 MG CAPS Take by mouth. daily       . Multiple Vitamin (MULTIVITAMIN) capsule Take 1 capsule by mouth daily.        . vitamin B-12 (CYANOCOBALAMIN) 500 MCG tablet Take 500 mcg by mouth daily.       No current facility-administered medications on file prior to visit.    Review of Systems Review of Systems  Constitutional: Negative for fever, appetite change,  and unexpected weight change. pos for fatigue ENT pos for sinus pain / pressure and purulent drainage/ neg for ear pain or d/c Eyes: Negative for pain and visual disturbance.  Respiratory: Negative for  shortness of breath.  pos for cough and wheeze  Cardiovascular: Negative for cp or palpitations    Gastrointestinal: Negative for nausea, diarrhea and constipation.  Genitourinary: Negative for urgency and frequency.  Skin: Negative for pallor or rash   Neurological: Negative for weakness, light-headedness, numbness and headaches.  Hematological: Negative for adenopathy. Does not bruise/bleed easily.  Psychiatric/Behavioral: Negative for dysphoric mood. The patient is not nervous/anxious.         Objective:   Physical Exam  Constitutional: She appears well-developed and well-nourished. No distress.  obese and well appearing  Persistent cough  HENT:  Head: Normocephalic  and atraumatic.  Right Ear: External ear normal.  Left Ear: External ear normal.  Mouth/Throat: Oropharynx is clear and moist.  No oropharyngeal exudate.  Nares are injected and congested  Mild frontal sinus tenderness Throat clear   Eyes: Conjunctivae and EOM are normal. Pupils are equal, round, and reactive to light. Right eye exhibits no discharge. Left eye exhibits no discharge.  Neck: Normal range of motion. Neck supple. No thyromegaly present.  Cardiovascular: Normal rate, regular rhythm and normal heart sounds.   Rate in high 80s at rest  Pulmonary/Chest: Effort normal and breath sounds normal. No respiratory distress. She has no wheezes. She has no rales.  Harsh bs occ rhonchi No rales or wheeze  Some upper airway sounds cleared by cough  Musculoskeletal: She exhibits no edema.  Lymphadenopathy:    She has no cervical adenopathy.  Neurological: She is alert.  Skin: Skin is warm and dry. No rash noted.  Psychiatric: She has a normal mood and affect.          Assessment & Plan:

## 2012-10-19 ENCOUNTER — Other Ambulatory Visit: Payer: Self-pay | Admitting: Family Medicine

## 2012-10-19 NOTE — Telephone Encounter (Signed)
Ok to refill, not the same dose of med on med list?

## 2012-10-19 NOTE — Telephone Encounter (Signed)
Please clarify with pt what dose she is on (last check was ok) and refil for 6 mo ,thanks

## 2012-10-21 ENCOUNTER — Telehealth: Payer: Self-pay | Admitting: *Deleted

## 2012-10-21 MED ORDER — HYDROCORTISONE ACETATE 25 MG RE SUPP: 25.0000 mg | Freq: Two times a day (BID) | RECTAL | Status: DC

## 2012-10-21 NOTE — Telephone Encounter (Signed)
Pt isn't having any other symptoms so advise her we sent in Rx and to f/u if no improvement

## 2012-10-21 NOTE — Telephone Encounter (Signed)
If she has other symptoms like abd pain -let me know Please call in px for anusol suppositories  Sorry to hear about her mother  Let me know if no improvement

## 2012-10-21 NOTE — Telephone Encounter (Signed)
Pt is taking the 88 mcg of synthroid and we received a refill request for 75 mcg (pt not taking) pt said she didn't need a refill on med an this was pharmacy error

## 2012-10-21 NOTE — Telephone Encounter (Signed)
Pt said she has hemorrhoids and they were bleeding really bad this morning. Pt said that she had to apply pressure to them for about 15 min to get the bleeding to stop, pt's mother just passed away and pt has to go to her funeral and wants to know if you could prescribed something for her hemorrhoids or advise her what to do to help them, please advise

## 2012-11-11 ENCOUNTER — Encounter: Payer: Self-pay | Admitting: Family Medicine

## 2012-11-11 ENCOUNTER — Ambulatory Visit (INDEPENDENT_AMBULATORY_CARE_PROVIDER_SITE_OTHER): Payer: Medicare Other | Admitting: Family Medicine

## 2012-11-11 VITALS — BP 124/72 | HR 71 | Temp 97.3°F | Ht 63.0 in

## 2012-11-11 DIAGNOSIS — J309 Allergic rhinitis, unspecified: Secondary | ICD-10-CM | POA: Diagnosis not present

## 2012-11-11 DIAGNOSIS — R0982 Postnasal drip: Secondary | ICD-10-CM | POA: Diagnosis not present

## 2012-11-11 MED ORDER — MONTELUKAST SODIUM 10 MG PO TABS: 10.0000 mg | ORAL_TABLET | Freq: Every day | ORAL | Status: DC

## 2012-11-11 NOTE — Assessment & Plan Note (Signed)
Suspect this is causing post nasal drip - will add zyrtec (instead of claritin) and continue nasacort otc  If no imp- given px for singulair as well  Will update

## 2012-11-11 NOTE — Progress Notes (Signed)
Subjective:    Patient ID: Linda Mcgrath, female    DOB: [birth year], 79 y.o.   MRN: [id number]  HPI Here for continued uri symptoms   Is overall quite a bit better  Still having quite a bit of congestion in throat - green mucous  Really tired-worn out  No deep lung congestion  Yesterday just felt bad all over  Back aches and so do legs - fibromyalgia is acting up   Not coughing a lot - just intermittent  Has hycodan and also tessalon Gets up coughing in middle of the night and also at 4 am   Not having any gerd symptoms   Using new nasal spray - on a new nasal spray - is otc - this is helpful- nasacort  No sinus pain  Not using flonase  Some sneezing  Intolerant to most antibiotics  (took 2 rounds of pack last mo)  Patient Active Problem List  Diagnosis  . HYPOTHYROIDISM  . UNSPECIFIED VITAMIN D DEFICIENCY  . HYPERLIPIDEMIA  . ALLERGIC RHINITIS  . IBS  . MENOPAUSAL SYNDROME  . OSTEOARTHRITIS  . FIBROMYALGIA  . OSTEOPENIA  . OSTEOARTHRITIS, HANDS, BILATERAL  . HYPERTENSION, BENIGN ESSENTIAL  . CONSTIPATION  . HEMATOCHEZIA  . Leg pain  . Hyperglycemia  . Conjunctival hemorrhage of right eye  . Other screening mammogram  . Post-nasal drip   Past Medical History  Diagnosis Date  . Osteoarthritis   . Fibromyalgia   . Hypertension   . Hypothyroidism   . Hyperlipidemia   . Internal hemorrhoids   . Colon polyp 1999    small polyp  . Diverticulosis   . Lung nodule     stable LUL 9 mm  . Cataract   . Allergic rhinitis   . Leg cramps   . Menopausal syndrome   . UTI (urinary tract infection)    Past Surgical History  Procedure Laterality Date  . Abdominal hysterectomy      total-fibroids  . Rotator cuff repair      x 2  . Skin cancer excision      pre-melanoma  . Breast biopsy      benign  . Cataract extraction      bilateral  . Abd u/s  11/1998    negative  . Abd u/s  01/2001    gallbladder polyps  . Hida scan  01/2001    Negative  .  Colonoscopy  1998    Diverticulosis; polyp\  . Dexa  10/2001    osteopenia  . Esophagogastroduodenoscopy  2004  . Colonoscopy  09/2002    Diverticulosis, hem  . Colonoscopy  12/2007    diverticulosis, polyp  . Appendectomy  1975  . Cholecystectomy     History  Substance Use Topics  . Smoking status: Never Smoker   . Smokeless tobacco: Never Used  . Alcohol Use: No     Comment: occasional   Family History  Problem Relation Age of Onset  . Parkinsonism Father   . Colon cancer Brother   . Kidney failure Son     congenital  . Allergies Mother    Allergies  Allergen Reactions  . Amlodipine Besylate     REACTION: muscle spasms, extremity swelling, low bp  . Amoxicillin   . Ciprofloxacin     REACTION: muscle spasms, insomnia  . Codeine     REACTION: hallucinations  . Penicillins   . Propoxyphene Hcl   . Sulfamethoxazole W-Trimethoprim     REACTION: dizzy, could not  focus eyes and could not concentrate and nausea  . Tetracycline     REACTION: heart palpitations,muscle spasms   Current Outpatient Prescriptions on File Prior to Visit  Medication Sig Dispense Refill  . albuterol (PROVENTIL HFA;VENTOLIN HFA) 108 (90 BASE) MCG/ACT inhaler Inhale 2 puffs into the lungs every 4 (four) hours as needed for wheezing.  1 Inhaler  3  . Ascorbic Acid (VITAMIN C) 1000 MG tablet Take 1,000 mg by mouth daily.        . B Complex Vitamins (VITAMIN B COMPLEX PO) Take 1 tablet by mouth daily.      . benzonatate (TESSALON) 200 MG capsule Take 1 capsule (200 mg total) by mouth 3 (three) times daily as needed for cough.  30 capsule  1  . Biotin 10 MG TABS Take by mouth. daily       . Calcium Carbonate-Vit D-Min 600-400 MG-UNIT TABS Take by mouth. daily       . Cholecalciferol (VITAMIN D) 2000 UNITS CAPS Take 1 capsule by mouth 2 (two) times daily.        . Coenzyme Q10 (CO Q 10) 100 MG CAPS Take 1 tablet by mouth daily.      . Cranberry 500 MG CAPS Take by mouth. daily       . cyclobenzaprine  (FLEXERIL) 10 MG tablet TAKE ONE TABLET BY MOUTH NIGHTLY AT BEDTIME AS NEEDED  30 tablet  5  . estradiol (ESTRACE) 1 MG tablet Take 1 tablet (1 mg total) by mouth daily.  30 tablet  11  . Flax OIL Take by mouth. daily       . guaiFENesin-dextromethorphan (ROBITUSSIN DM) 100-10 MG/5ML syrup Take 5 mLs by mouth 3 (three) times daily as needed for cough.      . hydrocortisone (ANUSOL-HC) 25 MG suppository Place 1 suppository (25 mg total) rectally 2 (two) times daily.  14 suppository  0  . ibuprofen (ADVIL,MOTRIN) 200 MG tablet Take 400 mg by mouth 2 (two) times daily as needed.        Marland Kitchen levothyroxine (SYNTHROID, LEVOTHROID) 88 MCG tablet Take 1 tablet (88 mcg total) by mouth daily.  90 tablet  3  . lisinopril (PRINIVIL,ZESTRIL) 10 MG tablet TAKE ONE TABLET BY MOUTH EVERY DAY IN THE MORNING.  30 tablet  2  . Lutein 20 MG CAPS Take by mouth. daily       . Multiple Vitamin (MULTIVITAMIN) capsule Take 1 capsule by mouth daily.        . vitamin B-12 (CYANOCOBALAMIN) 500 MCG tablet Take 500 mcg by mouth daily.      Marland Kitchen HYDROcodone-homatropine (HYCODAN) 5-1.5 MG/5ML syrup Take 5 mLs by mouth every 8 (eight) hours as needed for cough.  120 mL  0   No current facility-administered medications on file prior to visit.     Review of Systems Review of Systems  Constitutional: Negative for fever, appetite change, and unexpected weight change.  ENT pos for rhinorrhea and post nasal drip / neg for ear pain or st  Eyes: Negative for pain and visual disturbance.  Respiratory: Negative for wheeze and shortness of breath.   Cardiovascular: Negative for cp or palpitations    Gastrointestinal: Negative for nausea, diarrhea and constipation.  Genitourinary: Negative for urgency and frequency.  Skin: Negative for pallor or rash   Neurological: Negative for weakness, light-headedness, numbness and headaches.  Hematological: Negative for adenopathy. Does not bruise/bleed easily.  Psychiatric/Behavioral: Negative for  dysphoric mood. The patient is not nervous/anxious.  Objective:   Physical Exam  Constitutional: She appears well-developed and well-nourished. No distress.  obese and well appearing   HENT:  Head: Normocephalic and atraumatic.  Right Ear: External ear normal.  Left Ear: External ear normal.  Mouth/Throat: Oropharynx is clear and moist. No oropharyngeal exudate.  Nares are injected and congested  No sinus tenderness Clear post nasal drip  No throat clearing    Eyes: Conjunctivae and EOM are normal. Pupils are equal, round, and reactive to light. Right eye exhibits no discharge. Left eye exhibits no discharge.  Neck: Normal range of motion. Neck supple.  Cardiovascular: Normal rate and regular rhythm.   Pulmonary/Chest: Effort normal and breath sounds normal. No respiratory distress. She has no wheezes. She has no rales. She exhibits no tenderness.  Musculoskeletal: She exhibits no edema.  Lymphadenopathy:    She has no cervical adenopathy.  Neurological: She is alert.  Skin: Skin is warm and dry. No rash noted. No erythema.  Psychiatric: She has a normal mood and affect.          Assessment & Plan:

## 2012-11-11 NOTE — Assessment & Plan Note (Signed)
With night time cough / recent uri/ reassuring exam and no gerd sympt  Will try to control with zyrtec and continued nasacort If no imp will fill singulair to try  Adv to update if any sinusitis sympt or if no imp

## 2012-11-11 NOTE — Patient Instructions (Addendum)
For post nasal drip and cough try zyrtec 10 mg daily at bedtime (instead of claritin)  If this does not help over the next 1-2 weeks - fill px for singulair generic and start that once daily at night  If no improvement (or if you get worse/ run a fever or have facial pain)-please let me know

## 2012-11-18 ENCOUNTER — Other Ambulatory Visit: Payer: Self-pay | Admitting: Family Medicine

## 2012-11-23 DIAGNOSIS — M545 Low back pain, unspecified: Secondary | ICD-10-CM | POA: Diagnosis not present

## 2012-11-23 DIAGNOSIS — M999 Biomechanical lesion, unspecified: Secondary | ICD-10-CM | POA: Diagnosis not present

## 2012-11-24 DIAGNOSIS — M545 Low back pain, unspecified: Secondary | ICD-10-CM | POA: Diagnosis not present

## 2012-11-24 DIAGNOSIS — M999 Biomechanical lesion, unspecified: Secondary | ICD-10-CM | POA: Diagnosis not present

## 2012-11-25 ENCOUNTER — Telehealth: Payer: Self-pay

## 2012-11-25 MED ORDER — LEVOTHYROXINE SODIUM 88 MCG PO TABS: 88.0000 ug | ORAL_TABLET | Freq: Every day | ORAL | Status: DC

## 2012-11-25 NOTE — Telephone Encounter (Signed)
Pharmacy notified and they requested new Rx sent to pharmacy, Rx sent

## 2012-11-25 NOTE — Telephone Encounter (Signed)
Arlys John from [facility] on [address]; Arlys John said pt questioning the dose of thyroid med. In Oct pt received Synthroid 88 mcg and last refill 11/18/12 was changed to Synthroid 75 mcg. On pts med list is Synthroid 88 mcg. ( 11/18/12 was electronic refill request for Synthroid 75 mcg from [facility] [address]).Please advise.

## 2012-11-25 NOTE — Telephone Encounter (Signed)
It should be 88 mcg- please change this with the pharmacy,thanks

## 2012-12-14 ENCOUNTER — Encounter: Payer: Self-pay | Admitting: *Deleted

## 2012-12-14 ENCOUNTER — Encounter: Payer: Self-pay | Admitting: Family Medicine

## 2012-12-14 ENCOUNTER — Ambulatory Visit (INDEPENDENT_AMBULATORY_CARE_PROVIDER_SITE_OTHER): Payer: Medicare Other | Admitting: Family Medicine

## 2012-12-14 VITALS — BP 134/76 | HR 70 | Temp 98.3°F | Ht 63.0 in | Wt 202.5 lb

## 2012-12-14 DIAGNOSIS — E039 Hypothyroidism, unspecified: Secondary | ICD-10-CM | POA: Diagnosis not present

## 2012-12-14 DIAGNOSIS — J309 Allergic rhinitis, unspecified: Secondary | ICD-10-CM

## 2012-12-14 DIAGNOSIS — I10 Essential (primary) hypertension: Secondary | ICD-10-CM

## 2012-12-14 MED ORDER — LEVOTHYROXINE SODIUM 88 MCG PO TABS: 88.0000 ug | ORAL_TABLET | Freq: Every day | ORAL | Status: DC

## 2012-12-14 NOTE — Assessment & Plan Note (Signed)
bp in fair control at this time  No changes needed  Disc lifstyle change with low sodium diet and exercise   

## 2012-12-14 NOTE — Progress Notes (Signed)
Subjective:    Patient ID: Linda Mcgrath, female    DOB: [birth year], 79 y.o.   MRN: [id number]  HPI Here for f/u of chronic medical problems   Is overall better from illnesses this winter  Still having a lot of problems with fibromyalgia  - cannot get comfortable at night  Allergy medicines are helping her cough and congestion quite a bit  Wears a hat and washes clothes immed after being outdoors    Wt is up 2 lb bmi is 35  bp is stable today  No cp or palpitations or headaches or edema  No side effects to medicines  BP Readings from Last 3 Encounters:  12/14/12 134/76  11/11/12 124/72  10/12/12 124/82     Hyperglycemia lasta1c good Lab Results  Component Value Date   HGBA1C 5.4 06/10/2012     Hyperlipidemia Lab Results  Component Value Date   CHOL 195 06/10/2012   HDL 63.50 06/10/2012   LDLCALC 110* 06/10/2012   LDLDIRECT 124.0 11/27/2011   TRIG 110.0 06/10/2012   CHOLHDL 3 06/10/2012     Getting some exercise- working out in the yard That is bothering her muscles  Feels like thyroid is stable - last tsh ok  Hypothyroidism  Pt has no clinical changes No change in energy level/ hair or skin/ edema and no tremor Lab Results  Component Value Date   TSH 3.00 07/27/2012     Needs refil 90 days supply  Patient Active Problem List  Diagnosis  . HYPOTHYROIDISM  . UNSPECIFIED VITAMIN D DEFICIENCY  . HYPERLIPIDEMIA  . ALLERGIC RHINITIS  . IBS  . MENOPAUSAL SYNDROME  . OSTEOARTHRITIS  . FIBROMYALGIA  . OSTEOPENIA  . OSTEOARTHRITIS, HANDS, BILATERAL  . HYPERTENSION, BENIGN ESSENTIAL  . CONSTIPATION  . HEMATOCHEZIA  . Leg pain  . Hyperglycemia  . Conjunctival hemorrhage of right eye  . Other screening mammogram  . Post-nasal drip   Past Medical History  Diagnosis Date  . Osteoarthritis   . Fibromyalgia   . Hypertension   . Hypothyroidism   . Hyperlipidemia   . Internal hemorrhoids   . Colon polyp 1999    small polyp  . Diverticulosis    . Lung nodule     stable LUL 9 mm  . Cataract   . Allergic rhinitis   . Leg cramps   . Menopausal syndrome   . UTI (urinary tract infection)    Past Surgical History  Procedure Laterality Date  . Abdominal hysterectomy      total-fibroids  . Rotator cuff repair      x 2  . Skin cancer excision      pre-melanoma  . Breast biopsy      benign  . Cataract extraction      bilateral  . Abd u/s  11/1998    negative  . Abd u/s  01/2001    gallbladder polyps  . Hida scan  01/2001    Negative  . Colonoscopy  1998    Diverticulosis; polyp\  . Dexa  10/2001    osteopenia  . Esophagogastroduodenoscopy  2004  . Colonoscopy  09/2002    Diverticulosis, hem  . Colonoscopy  12/2007    diverticulosis, polyp  . Appendectomy  1975  . Cholecystectomy     History  Substance Use Topics  . Smoking status: Never Smoker   . Smokeless tobacco: Never Used  . Alcohol Use: No     Comment: occasional   Family History  Problem Relation  Age of Onset  . Parkinsonism Father   . Colon cancer Brother   . Kidney failure Son     congenital  . Allergies Mother    Allergies  Allergen Reactions  . Amlodipine Besylate     REACTION: muscle spasms, extremity swelling, low bp  . Amoxicillin   . Ciprofloxacin     REACTION: muscle spasms, insomnia  . Codeine     REACTION: hallucinations  . Penicillins   . Propoxyphene Hcl   . Sulfamethoxazole W-Trimethoprim     REACTION: dizzy, could not focus eyes and could not concentrate and nausea  . Tetracycline     REACTION: heart palpitations,muscle spasms   Current Outpatient Prescriptions on File Prior to Visit  Medication Sig Dispense Refill  . albuterol (PROVENTIL HFA;VENTOLIN HFA) 108 (90 BASE) MCG/ACT inhaler Inhale 2 puffs into the lungs every 4 (four) hours as needed for wheezing.  1 Inhaler  3  . Ascorbic Acid (VITAMIN C) 1000 MG tablet Take 1,000 mg by mouth daily.        . B Complex Vitamins (VITAMIN B COMPLEX PO) Take 1 tablet by mouth daily.       . Biotin 10 MG TABS Take by mouth. daily       . Calcium Carbonate-Vit D-Min 600-400 MG-UNIT TABS Take by mouth. daily       . Cholecalciferol (VITAMIN D) 2000 UNITS CAPS Take 1 capsule by mouth 2 (two) times daily.        . Coenzyme Q10 (CO Q 10) 100 MG CAPS Take 1 tablet by mouth daily.      . Cranberry 500 MG CAPS Take by mouth. daily       . cyclobenzaprine (FLEXERIL) 10 MG tablet TAKE ONE TABLET BY MOUTH NIGHTLY AT BEDTIME AS NEEDED  30 tablet  5  . estradiol (ESTRACE) 1 MG tablet Take 1 tablet (1 mg total) by mouth daily.  30 tablet  11  . Flax OIL Take by mouth. daily       . hydrocortisone (ANUSOL-HC) 25 MG suppository Place 1 suppository (25 mg total) rectally 2 (two) times daily.  14 suppository  0  . ibuprofen (ADVIL,MOTRIN) 200 MG tablet Take 400 mg by mouth 2 (two) times daily as needed.        Marland Kitchen levothyroxine (SYNTHROID, LEVOTHROID) 88 MCG tablet Take 1 tablet (88 mcg total) by mouth daily.  30 tablet  0  . lisinopril (PRINIVIL,ZESTRIL) 10 MG tablet TAKE ONE TABLET BY MOUTH EVERY DAY IN THE MORNING.  30 tablet  2  . Lutein 20 MG CAPS Take by mouth. daily       . montelukast (SINGULAIR) 10 MG tablet Take 1 tablet (10 mg total) by mouth at bedtime.  30 tablet  11  . Multiple Vitamin (MULTIVITAMIN) capsule Take 1 capsule by mouth daily.        Marland Kitchen triamcinolone (NASACORT) 55 MCG/ACT nasal inhaler Place 2 sprays into the nose daily.      . vitamin B-12 (CYANOCOBALAMIN) 500 MCG tablet Take 500 mcg by mouth daily.       No current facility-administered medications on file prior to visit.    Review of Systems Review of Systems  Constitutional: Negative for fever, appetite change, fatigue and unexpected weight change.  Eyes: Negative for pain and visual disturbance.  Respiratory: Negative for cough and shortness of breath.   Cardiovascular: Negative for cp or palpitations    Gastrointestinal: Negative for nausea, diarrhea and constipation.  Genitourinary: Negative for  urgency and  frequency.  Skin: Negative for pallor or rash   MSK pos for chronic myofascial pain  Neurological: Negative for weakness, light-headedness, numbness and headaches.  Hematological: Negative for adenopathy. Does not bruise/bleed easily.  Psychiatric/Behavioral: Negative for dysphoric mood. The patient is not nervous/anxious.         Objective:   Physical Exam  Constitutional: She appears well-developed and well-nourished. No distress.  obese and well appearing   HENT:  Head: Normocephalic and atraumatic.  Mouth/Throat: Oropharynx is clear and moist.  Eyes: Conjunctivae and EOM are normal. Pupils are equal, round, and reactive to light. No scleral icterus.  Neck: Normal range of motion. Neck supple. No JVD present. Carotid bruit is not present. No thyromegaly present.  Cardiovascular: Normal rate and regular rhythm.   Pulmonary/Chest: Effort normal and breath sounds normal. No respiratory distress. She has no wheezes.  Abdominal: Soft. Bowel sounds are normal. She exhibits no distension and no abdominal bruit.  Musculoskeletal: She exhibits no edema.  Lymphadenopathy:    She has no cervical adenopathy.  Neurological: She is alert. She has normal reflexes. No cranial nerve deficit. She exhibits normal muscle tone. Coordination normal.  Skin: Skin is warm and dry. No rash noted. No erythema. No pallor.  Psychiatric: She has a normal mood and affect.          Assessment & Plan:

## 2012-12-14 NOTE — Patient Instructions (Addendum)
I'm glad you are doing better  No medicine changes  Stay as active as you can Follow up in about 6 months for annual exam with labs prior

## 2012-12-14 NOTE — Assessment & Plan Note (Signed)
Much better on current regimen Will continue with it and also pollen avoidance when able

## 2012-12-14 NOTE — Assessment & Plan Note (Signed)
Better with 88 mcg dose  Lab ok in dec Refilled for 90 day supply

## 2012-12-18 ENCOUNTER — Other Ambulatory Visit: Payer: Self-pay | Admitting: Family Medicine

## 2012-12-23 DIAGNOSIS — M545 Low back pain, unspecified: Secondary | ICD-10-CM | POA: Diagnosis not present

## 2012-12-23 DIAGNOSIS — M542 Cervicalgia: Secondary | ICD-10-CM | POA: Diagnosis not present

## 2012-12-23 DIAGNOSIS — M9981 Other biomechanical lesions of cervical region: Secondary | ICD-10-CM | POA: Diagnosis not present

## 2012-12-23 DIAGNOSIS — M999 Biomechanical lesion, unspecified: Secondary | ICD-10-CM | POA: Diagnosis not present

## 2012-12-25 DIAGNOSIS — M545 Low back pain, unspecified: Secondary | ICD-10-CM | POA: Diagnosis not present

## 2012-12-25 DIAGNOSIS — M542 Cervicalgia: Secondary | ICD-10-CM | POA: Diagnosis not present

## 2012-12-25 DIAGNOSIS — M9981 Other biomechanical lesions of cervical region: Secondary | ICD-10-CM | POA: Diagnosis not present

## 2012-12-25 DIAGNOSIS — M999 Biomechanical lesion, unspecified: Secondary | ICD-10-CM | POA: Diagnosis not present

## 2012-12-28 DIAGNOSIS — M9981 Other biomechanical lesions of cervical region: Secondary | ICD-10-CM | POA: Diagnosis not present

## 2012-12-28 DIAGNOSIS — M545 Low back pain, unspecified: Secondary | ICD-10-CM | POA: Diagnosis not present

## 2012-12-28 DIAGNOSIS — M999 Biomechanical lesion, unspecified: Secondary | ICD-10-CM | POA: Diagnosis not present

## 2012-12-28 DIAGNOSIS — M542 Cervicalgia: Secondary | ICD-10-CM | POA: Diagnosis not present

## 2012-12-30 DIAGNOSIS — M545 Low back pain, unspecified: Secondary | ICD-10-CM | POA: Diagnosis not present

## 2012-12-30 DIAGNOSIS — M999 Biomechanical lesion, unspecified: Secondary | ICD-10-CM | POA: Diagnosis not present

## 2012-12-30 DIAGNOSIS — M542 Cervicalgia: Secondary | ICD-10-CM | POA: Diagnosis not present

## 2012-12-30 DIAGNOSIS — M9981 Other biomechanical lesions of cervical region: Secondary | ICD-10-CM | POA: Diagnosis not present

## 2013-01-01 DIAGNOSIS — M9981 Other biomechanical lesions of cervical region: Secondary | ICD-10-CM | POA: Diagnosis not present

## 2013-01-01 DIAGNOSIS — M545 Low back pain, unspecified: Secondary | ICD-10-CM | POA: Diagnosis not present

## 2013-01-01 DIAGNOSIS — M542 Cervicalgia: Secondary | ICD-10-CM | POA: Diagnosis not present

## 2013-01-01 DIAGNOSIS — M999 Biomechanical lesion, unspecified: Secondary | ICD-10-CM | POA: Diagnosis not present

## 2013-01-05 DIAGNOSIS — M542 Cervicalgia: Secondary | ICD-10-CM | POA: Diagnosis not present

## 2013-01-05 DIAGNOSIS — M999 Biomechanical lesion, unspecified: Secondary | ICD-10-CM | POA: Diagnosis not present

## 2013-01-05 DIAGNOSIS — M9981 Other biomechanical lesions of cervical region: Secondary | ICD-10-CM | POA: Diagnosis not present

## 2013-01-05 DIAGNOSIS — M545 Low back pain, unspecified: Secondary | ICD-10-CM | POA: Diagnosis not present

## 2013-01-06 DIAGNOSIS — H409 Unspecified glaucoma: Secondary | ICD-10-CM | POA: Diagnosis not present

## 2013-01-06 DIAGNOSIS — H4011X0 Primary open-angle glaucoma, stage unspecified: Secondary | ICD-10-CM | POA: Diagnosis not present

## 2013-01-07 ENCOUNTER — Encounter: Payer: Self-pay | Admitting: Internal Medicine

## 2013-01-07 DIAGNOSIS — M9981 Other biomechanical lesions of cervical region: Secondary | ICD-10-CM | POA: Diagnosis not present

## 2013-01-07 DIAGNOSIS — M545 Low back pain, unspecified: Secondary | ICD-10-CM | POA: Diagnosis not present

## 2013-01-07 DIAGNOSIS — M999 Biomechanical lesion, unspecified: Secondary | ICD-10-CM | POA: Diagnosis not present

## 2013-01-07 DIAGNOSIS — M542 Cervicalgia: Secondary | ICD-10-CM | POA: Diagnosis not present

## 2013-01-13 DIAGNOSIS — M545 Low back pain, unspecified: Secondary | ICD-10-CM | POA: Diagnosis not present

## 2013-01-13 DIAGNOSIS — M9981 Other biomechanical lesions of cervical region: Secondary | ICD-10-CM | POA: Diagnosis not present

## 2013-01-13 DIAGNOSIS — M542 Cervicalgia: Secondary | ICD-10-CM | POA: Diagnosis not present

## 2013-01-13 DIAGNOSIS — M999 Biomechanical lesion, unspecified: Secondary | ICD-10-CM | POA: Diagnosis not present

## 2013-01-16 ENCOUNTER — Encounter: Payer: Self-pay | Admitting: Family Medicine

## 2013-01-16 ENCOUNTER — Ambulatory Visit (INDEPENDENT_AMBULATORY_CARE_PROVIDER_SITE_OTHER): Payer: Medicare Other | Admitting: Family Medicine

## 2013-01-16 VITALS — BP 124/88 | HR 68 | Temp 97.8°F | Wt 204.0 lb

## 2013-01-16 DIAGNOSIS — T148 Other injury of unspecified body region: Secondary | ICD-10-CM | POA: Diagnosis not present

## 2013-01-16 DIAGNOSIS — W57XXXA Bitten or stung by nonvenomous insect and other nonvenomous arthropods, initial encounter: Secondary | ICD-10-CM

## 2013-01-16 NOTE — Progress Notes (Signed)
Date:  01/16/2013   Name:  Linda Mcgrath   DOB:  [birth year]   MRN:  [id number] Gender: female Age: 79 y.o.  Primary Physician:  Roxy Manns, MD  Evaluating MD: Hannah Beat, MD  Chief Complaint: tick bite   History of Present Illness:  Linda Mcgrath is a 79 y.o. very pleasant female patient who presents with the following:  Pulled off a small black tick yesterday. Mildly red around this area. No systemic symptoms. No fever, chills, or sweats. No nausea, vomitting, or diarrhea. No myalgias or arthralgias.  Past Medical History, Surgical History, Social History, Family History, Problem List, Medications, and Allergies have been reviewed and updated if relevant.  Current Outpatient Prescriptions on File Prior to Visit  Medication Sig Dispense Refill  . Ascorbic Acid (VITAMIN C) 1000 MG tablet Take 1,000 mg by mouth daily.        . B Complex Vitamins (VITAMIN B COMPLEX PO) Take 1 tablet by mouth daily.      . Biotin 10 MG TABS Take by mouth. daily       . Calcium Carbonate-Vit D-Min 600-400 MG-UNIT TABS Take by mouth. daily       . cetirizine (ZYRTEC) 10 MG tablet Take 10 mg by mouth daily as needed for allergies.      . Cholecalciferol (VITAMIN D) 2000 UNITS CAPS Take 1 capsule by mouth 2 (two) times daily.        . Coenzyme Q10 (CO Q 10) 100 MG CAPS Take 1 tablet by mouth daily.      . Cranberry 500 MG CAPS Take by mouth. daily       . cyclobenzaprine (FLEXERIL) 10 MG tablet TAKE ONE TABLET BY MOUTH NIGHTLY AT BEDTIME AS NEEDED  30 tablet  5  . estradiol (ESTRACE) 1 MG tablet Take 1 tablet (1 mg total) by mouth daily.  30 tablet  11  . Flax OIL Take by mouth. daily       . ibuprofen (ADVIL,MOTRIN) 200 MG tablet Take 400 mg by mouth 2 (two) times daily as needed.        Marland Kitchen lisinopril (PRINIVIL,ZESTRIL) 10 MG tablet TAKE ONE TABLET BY MOUTH EVERY MORNING  30 tablet  5  . Lutein 20 MG CAPS Take by mouth. daily       . montelukast (SINGULAIR) 10 MG tablet Take 1  tablet (10 mg total) by mouth at bedtime.  30 tablet  11  . Multiple Vitamin (MULTIVITAMIN) capsule Take 1 capsule by mouth daily.        Marland Kitchen SYNTHROID 75 MCG tablet TAKE ONE TABLET BY MOUTH ONE TIME DAILY  30 tablet  5  . triamcinolone (NASACORT) 55 MCG/ACT nasal inhaler Place 2 sprays into the nose daily.      . vitamin B-12 (CYANOCOBALAMIN) 500 MCG tablet Take 500 mcg by mouth daily.      Marland Kitchen albuterol (PROVENTIL HFA;VENTOLIN HFA) 108 (90 BASE) MCG/ACT inhaler Inhale 2 puffs into the lungs every 4 (four) hours as needed for wheezing.  1 Inhaler  3   No current facility-administered medications on file prior to visit.    Review of Systems: ROS: GEN: Acute illness details above GI: Tolerating PO intake GU: maintaining adequate hydration and urination Pulm: No SOB Interactive and getting along well at home.  Otherwise, ROS is as per the HPI.   Physical Examination: BP 124/88  Pulse 68  Temp(Src) 97.8 F (36.6 C) (Oral)  Wt 204 lb (92.534 kg)  BMI  36.15 kg/m2  LMP 08/26/1969   GEN: WDWN, NAD, Non-toxic, A & O x 3 HEENT: Atraumatic, Normocephalic. Neck supple. No masses, No LAD. Ears and Nose: No external deformity. CV: RRR, No M/G/R. No JVD. No thrill. No extra heart sounds. PULM: CTA B, no wheezes, crackles, rhonchi. No retractions. No resp. distress. No accessory muscle use. EXTR: No c/c/e, L anterior shin, area of mild redness 2-3 cm across sideways, 1 cm up and down. No clearing. Central dark spot at site of tick attachment. NEURO Normal gait.  PSYCH: Normally interactive. Conversant. Not depressed or anxious appearing.  Calm demeanor.    Assessment and Plan: Tick bite   No apparent infection. Doxy allergy - proph not recommended. Observe - she will contact me over the weekend if she worsens or next week in office.  Signed, Elpidio Galea. Sparrow [place], MD 01/16/2013 11:59 AM

## 2013-01-19 ENCOUNTER — Encounter: Payer: Self-pay | Admitting: Internal Medicine

## 2013-01-24 ENCOUNTER — Other Ambulatory Visit: Payer: Self-pay | Admitting: Family Medicine

## 2013-01-25 ENCOUNTER — Encounter: Payer: Self-pay | Admitting: Family Medicine

## 2013-01-25 ENCOUNTER — Ambulatory Visit (INDEPENDENT_AMBULATORY_CARE_PROVIDER_SITE_OTHER): Payer: Medicare Other | Admitting: Family Medicine

## 2013-01-25 VITALS — BP 136/78 | HR 90 | Temp 97.9°F | Wt 203.2 lb

## 2013-01-25 DIAGNOSIS — W57XXXA Bitten or stung by nonvenomous insect and other nonvenomous arthropods, initial encounter: Secondary | ICD-10-CM | POA: Diagnosis not present

## 2013-01-25 DIAGNOSIS — T148 Other injury of unspecified body region: Secondary | ICD-10-CM | POA: Diagnosis not present

## 2013-01-25 NOTE — Telephone Encounter (Signed)
Electronic refill request, please advise  

## 2013-01-25 NOTE — Telephone Encounter (Signed)
done

## 2013-01-25 NOTE — Assessment & Plan Note (Signed)
W/o retained FB or systemic sx.  Both lesions recently improved.  Continue with topical tx and notify us of systemic sx, progressive local sx.  She agrees. This is likely appropriate given her med allergies. Nontoxic.

## 2013-01-25 NOTE — Patient Instructions (Addendum)
Keep the areas covered with neosporin and a bandage.  If any spreading redness or fever then call us.  Take care.

## 2013-01-25 NOTE — Telephone Encounter (Signed)
Please refill for 3 mo  

## 2013-01-25 NOTE — Progress Notes (Signed)
2 tick bites. One noted on the L ankle- seen by Dr. Patsy Lager- erythema mproved some in meantime.  Recently with another tick- on the R forearm.  Yesterday with blisters on the R forearm at the bite site. The area was more red and puffy yesterday- better today.  No fevers. She wanted the lesions checked. She feels well o/w.   Med allergies reviewed.    Meds, vitals, and allergies reviewed.   ROS: See HPI.  Otherwise, noncontributory.  nad rrr ctab R forearm with 2cm ruptured bullae with no retained FB on magnification.  No spreading erythema.  L ankle anteriorly with central bite site and non acute appearing peripheral erythema that isn't hot to touch

## 2013-02-18 DIAGNOSIS — M545 Low back pain, unspecified: Secondary | ICD-10-CM | POA: Diagnosis not present

## 2013-02-18 DIAGNOSIS — M542 Cervicalgia: Secondary | ICD-10-CM | POA: Diagnosis not present

## 2013-02-18 DIAGNOSIS — M9981 Other biomechanical lesions of cervical region: Secondary | ICD-10-CM | POA: Diagnosis not present

## 2013-02-18 DIAGNOSIS — M999 Biomechanical lesion, unspecified: Secondary | ICD-10-CM | POA: Diagnosis not present

## 2013-02-22 DIAGNOSIS — M9981 Other biomechanical lesions of cervical region: Secondary | ICD-10-CM | POA: Diagnosis not present

## 2013-02-22 DIAGNOSIS — M542 Cervicalgia: Secondary | ICD-10-CM | POA: Diagnosis not present

## 2013-02-22 DIAGNOSIS — M545 Low back pain, unspecified: Secondary | ICD-10-CM | POA: Diagnosis not present

## 2013-02-22 DIAGNOSIS — M999 Biomechanical lesion, unspecified: Secondary | ICD-10-CM | POA: Diagnosis not present

## 2013-02-24 DIAGNOSIS — M545 Low back pain, unspecified: Secondary | ICD-10-CM | POA: Diagnosis not present

## 2013-02-24 DIAGNOSIS — M9981 Other biomechanical lesions of cervical region: Secondary | ICD-10-CM | POA: Diagnosis not present

## 2013-02-24 DIAGNOSIS — M542 Cervicalgia: Secondary | ICD-10-CM | POA: Diagnosis not present

## 2013-02-24 DIAGNOSIS — M999 Biomechanical lesion, unspecified: Secondary | ICD-10-CM | POA: Diagnosis not present

## 2013-03-02 DIAGNOSIS — M545 Low back pain, unspecified: Secondary | ICD-10-CM | POA: Diagnosis not present

## 2013-03-02 DIAGNOSIS — M999 Biomechanical lesion, unspecified: Secondary | ICD-10-CM | POA: Diagnosis not present

## 2013-03-02 DIAGNOSIS — M9981 Other biomechanical lesions of cervical region: Secondary | ICD-10-CM | POA: Diagnosis not present

## 2013-03-02 DIAGNOSIS — M542 Cervicalgia: Secondary | ICD-10-CM | POA: Diagnosis not present

## 2013-03-04 DIAGNOSIS — M545 Low back pain, unspecified: Secondary | ICD-10-CM | POA: Diagnosis not present

## 2013-03-04 DIAGNOSIS — M999 Biomechanical lesion, unspecified: Secondary | ICD-10-CM | POA: Diagnosis not present

## 2013-03-04 DIAGNOSIS — M9981 Other biomechanical lesions of cervical region: Secondary | ICD-10-CM | POA: Diagnosis not present

## 2013-03-04 DIAGNOSIS — M542 Cervicalgia: Secondary | ICD-10-CM | POA: Diagnosis not present

## 2013-03-08 DIAGNOSIS — M545 Low back pain, unspecified: Secondary | ICD-10-CM | POA: Diagnosis not present

## 2013-03-08 DIAGNOSIS — M542 Cervicalgia: Secondary | ICD-10-CM | POA: Diagnosis not present

## 2013-03-08 DIAGNOSIS — M9981 Other biomechanical lesions of cervical region: Secondary | ICD-10-CM | POA: Diagnosis not present

## 2013-03-08 DIAGNOSIS — M999 Biomechanical lesion, unspecified: Secondary | ICD-10-CM | POA: Diagnosis not present

## 2013-03-10 DIAGNOSIS — H40019 Open angle with borderline findings, low risk, unspecified eye: Secondary | ICD-10-CM | POA: Diagnosis not present

## 2013-03-11 ENCOUNTER — Ambulatory Visit (AMBULATORY_SURGERY_CENTER): Payer: Medicare Other

## 2013-03-11 VITALS — Ht 65.0 in | Wt 203.2 lb

## 2013-03-11 DIAGNOSIS — Z800 Family history of malignant neoplasm of digestive organs: Secondary | ICD-10-CM

## 2013-03-11 DIAGNOSIS — Z86010 Personal history of colonic polyps: Secondary | ICD-10-CM

## 2013-03-11 MED ORDER — MOVIPREP 100 G PO SOLR: ORAL | Status: DC

## 2013-03-16 ENCOUNTER — Ambulatory Visit (INDEPENDENT_AMBULATORY_CARE_PROVIDER_SITE_OTHER): Payer: Medicare Other | Admitting: Family Medicine

## 2013-03-16 ENCOUNTER — Encounter: Payer: Self-pay | Admitting: Family Medicine

## 2013-03-16 VITALS — BP 122/64 | HR 87 | Temp 98.5°F | Ht 63.0 in | Wt 202.2 lb

## 2013-03-16 DIAGNOSIS — K120 Recurrent oral aphthae: Secondary | ICD-10-CM | POA: Diagnosis not present

## 2013-03-16 NOTE — Assessment & Plan Note (Signed)
Will tx with kenalog in oragel base bid and observe  Disc brushing habits and diet - will change to manual soft toothbrush to avoid abrasion and eat a bland diet No trauma noted If not resolved 1 mo will plan ENT ref for further eval

## 2013-03-16 NOTE — Patient Instructions (Addendum)
Brush teeth gently with handheld soft toothbrush  Try the kenalog in orabase gel once to twice daily for 1-2 weeks If the ulcer is not gone in a month we need to know- call and I will do a referral  Avoid acidic foods and beverages Go for your colonoscopy

## 2013-03-16 NOTE — Progress Notes (Signed)
Subjective:    Patient ID: Linda Mcgrath, female    DOB: Sep 25, 1932, 79 y.o.   MRN: [id number]  HPI Here for a lesion in her mouth  Thinks it is an ulcer- behind her upper lip on right side (fairly high up)  Dental exam was in may - ok  This started within the past week   No partials or dental appliances at all  No recent very sour/ gummy foods  Drinks OJ daily and more tomatoes   No tab products- ever (no snuff or tab or smoking)   She does not "brush too hard" She uses a battery op toothbrush and sensodine pronamel toothpaste   Patient Active Problem List   Diagnosis Date Noted  . Tick bite 01/25/2013  . Post-nasal drip 11/11/2012  . Other screening mammogram 06/15/2012  . Conjunctival hemorrhage of right eye 10/09/2011  . Hyperglycemia 06/03/2011  . Leg pain 05/07/2011  . HYPERTENSION, BENIGN ESSENTIAL 10/23/2010  . CONSTIPATION 10/23/2010  . HEMATOCHEZIA 10/23/2010  . OSTEOARTHRITIS, HANDS, BILATERAL 01/24/2010  . HYPOTHYROIDISM 08/24/2008  . UNSPECIFIED VITAMIN D DEFICIENCY 06/06/2008  . HYPERLIPIDEMIA 06/06/2008  . MENOPAUSAL SYNDROME 03/29/2008  . OSTEOPENIA 03/29/2008  . ALLERGIC RHINITIS 01/25/2008  . IBS 01/25/2008  . OSTEOARTHRITIS 01/25/2008  . FIBROMYALGIA 01/25/2008   Past Medical History  Diagnosis Date  . Osteoarthritis   . Fibromyalgia   . Hypertension   . Hypothyroidism   . Hyperlipidemia   . Internal hemorrhoids   . Colon polyp 1999    small polyp  . Diverticulosis   . Lung nodule     stable LUL 9 mm  . Cataract     Bil/lens implant  . Allergic rhinitis   . Leg cramps   . Menopausal syndrome   . UTI (urinary tract infection)    Past Surgical History  Procedure Laterality Date  . Abdominal hysterectomy  1975    total-fibroids  . Rotator cuff repair      x 2 /right shoulder  . Skin cancer excision      pre-melanoma / on face  . Breast biopsy      benign  . Cataract extraction      bilateral  . Abd u/s  11/1998     negative  . Abd u/s  01/2001    gallbladder polyps  . Hida scan  01/2001    Negative  . Colonoscopy  1998    Diverticulosis; polyp\  . Dexa  10/2001    osteopenia  . Esophagogastroduodenoscopy  2004  . Colonoscopy  09/2002    Diverticulosis, hem  . Colonoscopy  12/2007    diverticulosis, polyp  . Appendectomy  1975  . Cholecystectomy    . Knee surgery      right knee / 11/2004   History  Substance Use Topics  . Smoking status: Never Smoker   . Smokeless tobacco: Never Used  . Alcohol Use: No     Comment: occasional   Family History  Problem Relation Age of Onset  . Parkinsonism Father   . Colon cancer Brother   . Kidney failure Son     congenital  . Allergies Mother   . Heart disease Mother    Allergies  Allergen Reactions  . Amlodipine Besylate     REACTION: muscle spasms, extremity swelling, low bp  . Amoxicillin   . Ciprofloxacin     REACTION: muscle spasms, insomnia  . Codeine     REACTION: hallucinations  . Penicillins   . Propoxyphene  Hcl   . Sulfamethoxazole W-Trimethoprim     REACTION: dizzy, could not focus eyes and could not concentrate and nausea  . Tetracycline     REACTION: heart palpitations,muscle spasms   Current Outpatient Prescriptions on File Prior to Visit  Medication Sig Dispense Refill  . albuterol (PROVENTIL HFA;VENTOLIN HFA) 108 (90 BASE) MCG/ACT inhaler Inhale 2 puffs into the lungs every 4 (four) hours as needed for wheezing.  1 Inhaler  3  . Ascorbic Acid (VITAMIN C) 1000 MG tablet Take 1,000 mg by mouth daily.        . B Complex Vitamins (VITAMIN B COMPLEX PO) Take 1 tablet by mouth daily.      . Biotin 10 MG TABS Take by mouth. daily       . Calcium Carbonate-Vit D-Min 600-400 MG-UNIT TABS Take by mouth. daily       . cetirizine (ZYRTEC) 10 MG tablet Take 10 mg by mouth daily as needed for allergies.      . Cholecalciferol (VITAMIN D) 2000 UNITS CAPS Take 1 capsule by mouth 2 (two) times daily.        . COCONUT OIL PO Take by mouth.  Take 2 pills daily      . Coenzyme Q10 (CO Q 10) 100 MG CAPS Take 1 tablet by mouth daily.      . Cranberry 500 MG CAPS Take by mouth. daily       . cyclobenzaprine (FLEXERIL) 10 MG tablet TAKE ONE TABLET BY MOUTH NIGHTLY AT BEDTIME AS NEEDED  30 tablet  2  . estradiol (ESTRACE) 1 MG tablet Take 1 tablet (1 mg total) by mouth daily.  30 tablet  11  . Flax OIL Take by mouth. daily       . ibuprofen (ADVIL,MOTRIN) 200 MG tablet Take 400 mg by mouth 2 (two) times daily as needed.        Marland Kitchen levothyroxine (SYNTHROID, LEVOTHROID) 88 MCG tablet Take 88 mcg by mouth daily before breakfast.      . lisinopril (PRINIVIL,ZESTRIL) 10 MG tablet TAKE ONE TABLET BY MOUTH EVERY MORNING  30 tablet  5  . Lutein 20 MG CAPS Take by mouth. daily       . montelukast (SINGULAIR) 10 MG tablet Take 1 tablet (10 mg total) by mouth at bedtime.  30 tablet  11  . MOVIPREP 100 G SOLR Moviprep as directed / no substitutions  1 kit  0  . Multiple Vitamin (MULTIVITAMIN) capsule Take 1 capsule by mouth daily.        Marland Kitchen SYNTHROID 75 MCG tablet TAKE ONE TABLET BY MOUTH ONE TIME DAILY  30 tablet  5  . triamcinolone (NASACORT) 55 MCG/ACT nasal inhaler Place 2 sprays into the nose as needed.       . vitamin B-12 (CYANOCOBALAMIN) 500 MCG tablet Take 500 mcg by mouth daily.       No current facility-administered medications on file prior to visit.      Review of Systems Review of Systems  Constitutional: Negative for fever, appetite change, fatigue and unexpected weight change.  ENT neg for ST or nasal congestion or facial pain neg for trauma to mouth Eyes: Negative for pain and visual disturbance.  Respiratory: Negative for cough and shortness of breath.   Cardiovascular: Negative for cp or palpitations    Gastrointestinal: Negative for nausea, diarrhea and constipation. (had one episode of urgent bowels last week which is unusual for her) neg for abd pain or blood in stool  Genitourinary: Negative for urgency and frequency.   Skin: Negative for pallor or rash   Neurological: Negative for weakness, light-headedness, numbness and headaches.  Hematological: Negative for adenopathy. Does not bruise/bleed easily.  Psychiatric/Behavioral: Negative for dysphoric mood. The patient is not nervous/anxious.         Objective:   Physical Exam  Constitutional: She appears well-developed and well-nourished. No distress.  obese and well appearing   HENT:  Head: Normocephalic and atraumatic.  Right Ear: External ear normal.  Left Ear: External ear normal.  Nose: Nose normal.  Mouth/Throat: No oropharyngeal exudate.  apthous ulcer under R upper lip along gumline 1-2 cm in length/ oval with erythematous border and white base-mildly tender to the touch Another ulcer post to last molar R posterior -about 2-3 mm round and similar in appearance Fair dentition No signs of trauma Some crowns noted   Eyes: Conjunctivae and EOM are normal. Pupils are equal, round, and reactive to light. Right eye exhibits no discharge. Left eye exhibits no discharge.  Neck: Normal range of motion. Neck supple.  Cardiovascular: Normal rate and regular rhythm.   Abdominal: Soft. Bowel sounds are normal. She exhibits no distension and no mass. There is no tenderness.  Lymphadenopathy:    She has no cervical adenopathy.  Neurological: She is alert.  Skin: Skin is warm and dry. No rash noted. No erythema. No pallor.  Psychiatric: She has a normal mood and affect.          Assessment & Plan:

## 2013-03-25 ENCOUNTER — Ambulatory Visit (AMBULATORY_SURGERY_CENTER): Payer: Medicare Other | Admitting: Internal Medicine

## 2013-03-25 ENCOUNTER — Encounter: Payer: Self-pay | Admitting: Internal Medicine

## 2013-03-25 VITALS — BP 128/79 | HR 63 | Temp 97.3°F | Resp 21 | Ht 65.0 in | Wt 203.0 lb

## 2013-03-25 DIAGNOSIS — Z86010 Personal history of colonic polyps: Secondary | ICD-10-CM

## 2013-03-25 DIAGNOSIS — I10 Essential (primary) hypertension: Secondary | ICD-10-CM | POA: Diagnosis not present

## 2013-03-25 DIAGNOSIS — R222 Localized swelling, mass and lump, trunk: Secondary | ICD-10-CM | POA: Diagnosis not present

## 2013-03-25 DIAGNOSIS — Z800 Family history of malignant neoplasm of digestive organs: Secondary | ICD-10-CM

## 2013-03-25 DIAGNOSIS — D126 Benign neoplasm of colon, unspecified: Secondary | ICD-10-CM | POA: Diagnosis not present

## 2013-03-25 MED ORDER — SODIUM CHLORIDE 0.9 % IV SOLN: 500.0000 mL | INTRAVENOUS | Status: DC

## 2013-03-25 NOTE — Progress Notes (Signed)
Called to room to assist during endoscopic procedure.  Patient ID and intended procedure confirmed with present staff. Received instructions for my participation in the procedure from the performing physician.  

## 2013-03-25 NOTE — Progress Notes (Signed)
No egg or soy allergy. ewm 

## 2013-03-25 NOTE — Op Note (Signed)
[facility] [address] [place], [address]   COLONOSCOPY PROCEDURE REPORT  PATIENT: Linda Mcgrath, Linda Mcgrath  MR#: [id number] BIRTHDATE: 1933/06/26 , 79  yrs. old GENDER: Female ENDOSCOPIST: Roxy Cedar, MD REFERRED [id number] Program Recall PROCEDURE DATE:  03/25/2013 PROCEDURE:   Colonoscopy with snare polypectomy x 1 First Screening Colonoscopy - Avg.  risk and is 50 yrs.  old or older - No.  Prior Negative Screening - Now for repeat screening. N/A  History of Adenoma - Now for follow-up colonoscopy & has been > or = to 3 yrs.  Yes hx of adenoma.  Has been 3 or more years since last colonoscopy.  Polyps Removed Today? Yes. ASA CLASS:   Class II INDICATIONS:Patient's personal history of colon polyps and Patient's immediate family history of colon cancer.   brother 37. Last exam 12-2007 MEDICATIONS: propofol (Diprivan) 300mg  IV and MAC sedation, administered by CRNA  DESCRIPTION OF PROCEDURE:   After the risks benefits and alternatives of the procedure were thoroughly explained, informed consent was obtained.  A digital rectal exam revealed no abnormalities of the rectum.   The [id number]  endoscope was introduced through the anus and advanced to the cecum, which was identified by both the appendix and ileocecal valve. No adverse events experienced.   The quality of the prep was good, using MoviPrep  The instrument was then slowly withdrawn as the colon was fully examined.  COLON FINDINGS: A diminutive polyp was found in the ascending colon. A polypectomy was performed with a cold snare.  The resection was complete and the polyp tissue was completely retrieved.   Moderate diverticulosis was noted The finding was in the left colon.   The colon mucosa was otherwise normal.  Retroflexed views revealed internal hemorrhoids. The time to cecum=4 minutes 11 seconds. Withdrawal time=10 minutes 21 seconds.  The scope was withdrawn and the  procedure completed. COMPLICATIONS: There were no complications.  ENDOSCOPIC IMPRESSION: 1.   Diminutive polyp was found in the ascending colon; polypectomy was performed with a cold snare 2.   Moderate diverticulosis was noted in the left colon 3.   The colon mucosa was otherwise normal  RECOMMENDATIONS: 1. Return to the care of your primary provider.  GI follow up as needed   eSigned:  Roxy Cedar, MD 03/25/2013 9:49 AM   cc: Judy Pimple, MD and The Patient   PATIENT NAME:  Linda Mcgrath, Linda Mcgrath MR#: [id number]

## 2013-03-25 NOTE — Progress Notes (Signed)
No complaints noted in the recovery room. Maw   

## 2013-03-25 NOTE — Patient Instructions (Addendum)
YOU HAD AN ENDOSCOPIC PROCEDURE TODAY AT THE [facility]: Refer to the procedure report that was given to you for any specific questions about what was found during the examination.  If the procedure report does not answer your questions, please call your gastroenterologist to clarify.  If you requested that your care partner not be given the details of your procedure findings, then the procedure report has been included in a sealed envelope for you to review at your convenience later.  YOU SHOULD EXPECT: Some feelings of bloating in the abdomen. Passage of more gas than usual.  Walking can help get rid of the air that was put into your GI tract during the procedure and reduce the bloating. If you had a lower endoscopy (such as a colonoscopy or flexible sigmoidoscopy) you may notice spotting of blood in your stool or on the toilet paper. If you underwent a bowel prep for your procedure, then you may not have a normal bowel movement for a few days.  DIET: Your first meal following the procedure should be a light meal and then it is ok to progress to your normal diet.  A half-sandwich or bowl of soup is an example of a good first meal.  Heavy or fried foods are harder to digest and may make you feel nauseous or bloated.  Likewise meals heavy in dairy and vegetables can cause extra gas to form and this can also increase the bloating.  Drink plenty of fluids but you should avoid alcoholic beverages for 24 hours.  ACTIVITY: Your care partner should take you home directly after the procedure.  You should plan to take it easy, moving slowly for the rest of the day.  You can resume normal activity the day after the procedure however you should NOT DRIVE or use heavy machinery for 24 hours (because of the sedation medicines used during the test).    SYMPTOMS TO REPORT IMMEDIATELY: A gastroenterologist can be reached at any hour.  During normal business hours, 8:30 AM to 5:00 PM Monday through Friday,  call [phone].  After hours and on weekends, please call the GI answering service at [phone] who will take a message and have the physician on call contact you.   Following lower endoscopy (colonoscopy or flexible sigmoidoscopy):  Excessive amounts of blood in the stool  Significant tenderness or worsening of abdominal pains  Swelling of the abdomen that is new, acute  Fever of 100F or higher    FOLLOW UP: If any biopsies were taken you will be contacted by phone or by letter within the next 1-3 weeks.  Call your gastroenterologist if you have not heard about the biopsies in 3 weeks.  Our staff will call the home number listed on your records the next business day following your procedure to check on you and address any questions or concerns that you may have at that time regarding the information given to you following your procedure. This is a courtesy call and so if there is no answer at the home number and we have not heard from you through the emergency physician on call, we will assume that you have returned to your regular daily activities without incident.  SIGNATURES/CONFIDENTIALITY: You and/or your care partner have signed paperwork which will be entered into your electronic medical record.  These signatures attest to the fact that that the information above on your After Visit Summary has been reviewed and is understood.  Full responsibility of the confidentiality   of this discharge information lies with you and/or your care-partner.   Handouts were given to your care partner on polyps, diverticulosis and a high fiber diet. You may resume your current medications today. Please call if any questions or concerns.    

## 2013-03-26 ENCOUNTER — Telehealth: Payer: Self-pay

## 2013-03-26 NOTE — Telephone Encounter (Signed)
  Follow up Call-  Call back number 03/25/2013  Post procedure Call Back phone  # [phone] cell  Permission to leave phone message Yes     Patient questions:  Do you have a fever, pain , or abdominal swelling? no Pain Score  0 *  Have you tolerated food without any problems? yes  Have you been able to return to your normal activities? yes  Do you have any questions about your discharge instructions: Diet   no Medications  no Follow up visit  no  Do you have questions or concerns about your Care? no  Actions: * If pain score is 4 or above: No action needed, pain <4.

## 2013-03-30 ENCOUNTER — Encounter: Payer: Self-pay | Admitting: Internal Medicine

## 2013-04-24 ENCOUNTER — Other Ambulatory Visit: Payer: Self-pay | Admitting: Family Medicine

## 2013-04-27 NOTE — Telephone Encounter (Signed)
done

## 2013-04-27 NOTE — Telephone Encounter (Signed)
Please refill for 6 months, thanks  

## 2013-04-27 NOTE — Telephone Encounter (Signed)
Received refill request electronically. Last office visit 03/16/13. Is it okay to refill medication? 

## 2013-04-30 ENCOUNTER — Ambulatory Visit (INDEPENDENT_AMBULATORY_CARE_PROVIDER_SITE_OTHER): Payer: Medicare Other | Admitting: Family Medicine

## 2013-04-30 ENCOUNTER — Encounter: Payer: Self-pay | Admitting: Family Medicine

## 2013-04-30 VITALS — BP 128/86 | HR 80 | Temp 97.8°F | Ht 63.0 in | Wt 201.0 lb

## 2013-04-30 DIAGNOSIS — K120 Recurrent oral aphthae: Secondary | ICD-10-CM

## 2013-04-30 DIAGNOSIS — M2550 Pain in unspecified joint: Secondary | ICD-10-CM | POA: Diagnosis not present

## 2013-04-30 DIAGNOSIS — T148 Other injury of unspecified body region: Secondary | ICD-10-CM

## 2013-04-30 DIAGNOSIS — W57XXXA Bitten or stung by nonvenomous insect and other nonvenomous arthropods, initial encounter: Secondary | ICD-10-CM

## 2013-04-30 LAB — SEDIMENTATION RATE: Sed Rate: 20 mm/hr (ref 0–22)

## 2013-04-30 MED ORDER — TRIAMCINOLONE ACETONIDE 0.1 % MT PSTE: 1.0000 "application " | PASTE | Freq: Two times a day (BID) | OROMUCOSAL | Status: DC

## 2013-04-30 NOTE — Progress Notes (Signed)
Subjective:    Patient ID: Linda Mcgrath, female    DOB: November 07, 1932, 79 y.o.   MRN: [id number]  HPI Here with mouth ulcers again  They just move around to different areas Whole mouth is inflammed - incl tongue and roof of mouth   She went to her dentist  He px Peridex rinse- using 2-3 times per day Gums are sore (some trouble chewing)  Not bleeding but skin is peeling off   Kenalog in orabase helps   No acid reflux symptoms lately  She takes really good care of her mouth   Pt is worried that it is a side eff from her tick bites this summer  She is worried about lyme dz syndrome   Patient Active Problem List   Diagnosis Date Noted  . Aphthous ulcer of mouth 03/16/2013  . Tick bite 01/25/2013  . Post-nasal drip 11/11/2012  . Other screening mammogram 06/15/2012  . Conjunctival hemorrhage of right eye 10/09/2011  . Hyperglycemia 06/03/2011  . Leg pain 05/07/2011  . HYPERTENSION, BENIGN ESSENTIAL 10/23/2010  . CONSTIPATION 10/23/2010  . HEMATOCHEZIA 10/23/2010  . OSTEOARTHRITIS, HANDS, BILATERAL 01/24/2010  . HYPOTHYROIDISM 08/24/2008  . UNSPECIFIED VITAMIN D DEFICIENCY 06/06/2008  . HYPERLIPIDEMIA 06/06/2008  . MENOPAUSAL SYNDROME 03/29/2008  . OSTEOPENIA 03/29/2008  . ALLERGIC RHINITIS 01/25/2008  . IBS 01/25/2008  . OSTEOARTHRITIS 01/25/2008  . FIBROMYALGIA 01/25/2008   Past Medical History  Diagnosis Date  . Osteoarthritis   . Fibromyalgia   . Hypertension   . Hypothyroidism   . Hyperlipidemia   . Internal hemorrhoids   . Colon polyp 1999    small polyp  . Diverticulosis   . Lung nodule     stable LUL 9 mm  . Cataract     Bil/lens implant  . Allergic rhinitis   . Leg cramps   . Menopausal syndrome   . UTI (urinary tract infection)    Past Surgical History  Procedure Laterality Date  . Abdominal hysterectomy  1975    total-fibroids  . Rotator cuff repair      x 2 /right shoulder  . Skin cancer excision      pre-melanoma / on face  .  Breast biopsy      benign  . Cataract extraction      bilateral  . Abd u/s  11/1998    negative  . Abd u/s  01/2001    gallbladder polyps  . Hida scan  01/2001    Negative  . Colonoscopy  1998    Diverticulosis; polyp\  . Dexa  10/2001    osteopenia  . Esophagogastroduodenoscopy  2004  . Colonoscopy  09/2002    Diverticulosis, hem  . Colonoscopy  12/2007    diverticulosis, polyp  . Appendectomy  1975  . Cholecystectomy    . Knee surgery      right knee / 11/2004   History  Substance Use Topics  . Smoking status: Never Smoker   . Smokeless tobacco: Never Used  . Alcohol Use: No     Comment: occasional   Family History  Problem Relation Age of Onset  . Parkinsonism Father   . Colon cancer Brother   . Kidney failure Son     congenital  . Allergies Mother   . Heart disease Mother    Allergies  Allergen Reactions  . Amlodipine Besylate     REACTION: muscle spasms, extremity swelling, low bp  . Amoxicillin   . Ciprofloxacin     REACTION: muscle  spasms, insomnia  . Codeine     REACTION: hallucinations  . Penicillins   . Propoxyphene Hcl   . Sulfamethoxazole W-Trimethoprim     REACTION: dizzy, could not focus eyes and could not concentrate and nausea  . Tetracycline     REACTION: heart palpitations,muscle spasms   Current Outpatient Prescriptions on File Prior to Visit  Medication Sig Dispense Refill  . albuterol (PROVENTIL HFA;VENTOLIN HFA) 108 (90 BASE) MCG/ACT inhaler Inhale 2 puffs into the lungs every 4 (four) hours as needed for wheezing.  1 Inhaler  3  . Ascorbic Acid (VITAMIN C) 1000 MG tablet Take 1,000 mg by mouth daily.        . B Complex Vitamins (VITAMIN B COMPLEX PO) Take 1 tablet by mouth daily.      . Biotin 10 MG TABS Take by mouth. daily       . Calcium Carbonate-Vit D-Min 600-400 MG-UNIT TABS Take by mouth. daily       . cetirizine (ZYRTEC) 10 MG tablet Take 10 mg by mouth daily as needed for allergies.      . Cholecalciferol (VITAMIN D) 2000 UNITS  CAPS Take 1 capsule by mouth 2 (two) times daily.        . COCONUT OIL PO Take by mouth. Take 2 pills daily      . Coenzyme Q10 (CO Q 10) 100 MG CAPS Take 1 tablet by mouth daily.      . Cranberry 500 MG CAPS Take by mouth. daily       . cyclobenzaprine (FLEXERIL) 10 MG tablet Take one tablet by mouth nightly at bedtime  as needed  30 tablet  5  . estradiol (ESTRACE) 1 MG tablet Take 1 tablet (1 mg total) by mouth daily.  30 tablet  11  . Flax OIL Take by mouth. daily       . ibuprofen (ADVIL,MOTRIN) 200 MG tablet Take 400 mg by mouth 2 (two) times daily as needed.        Marland Kitchen levothyroxine (SYNTHROID, LEVOTHROID) 88 MCG tablet Take 88 mcg by mouth daily before breakfast.      . lisinopril (PRINIVIL,ZESTRIL) 10 MG tablet TAKE ONE TABLET BY MOUTH EVERY MORNING  30 tablet  5  . loratadine-pseudoephedrine (CLARITIN-D 24-HOUR) 10-240 MG per 24 hr tablet Take 1 tablet by mouth daily.      . Lutein 20 MG CAPS Take by mouth. daily       . montelukast (SINGULAIR) 10 MG tablet Take 1 tablet (10 mg total) by mouth at bedtime.  30 tablet  11  . Multiple Vitamin (MULTIVITAMIN) capsule Take 1 capsule by mouth daily.        Marland Kitchen triamcinolone (NASACORT) 55 MCG/ACT nasal inhaler Place 2 sprays into the nose as needed.       . vitamin B-12 (CYANOCOBALAMIN) 500 MCG tablet Take 500 mcg by mouth daily.       No current facility-administered medications on file prior to visit.    Review of Systems Review of Systems  Constitutional: Negative for fever, appetite change, fatigue and unexpected weight change.  ENT neg for uri symptoms or ST Eyes: Negative for pain and visual disturbance.  Respiratory: Negative for cough and shortness of breath.   Cardiovascular: Negative for cp or palpitations    Gastrointestinal: Negative for nausea, diarrhea and constipation.  Genitourinary: Negative for urgency and frequency.  Skin: Negative for pallor or rash   MSK pos for joint pain - (without swelling) more than her  usual with  fibromyalgia  Neurological: Negative for weakness, light-headedness, numbness and headaches.  Hematological: Negative for adenopathy. Does not bruise/bleed easily.  Psychiatric/Behavioral: Negative for dysphoric mood. The patient is not nervous/anxious.         Objective:   Physical Exam  Constitutional: She appears well-developed and well-nourished. No distress.  obese and well appearing   HENT:  Head: Normocephalic and atraumatic.  Right Ear: External ear normal.  Left Ear: External ear normal.  Nose: Nose normal.  Mouth/Throat: Oropharynx is clear and moist.  Several white based apthous ulcers on buccal mucosa Largest on inside of L upper lip Signs of gingivitis / swelling and redness of gums   Eyes: Conjunctivae and EOM are normal. Pupils are equal, round, and reactive to light. Right eye exhibits no discharge. Left eye exhibits no discharge. No scleral icterus.  Neck: Normal range of motion. Neck supple.  Cardiovascular: Normal rate and regular rhythm.   Pulmonary/Chest: Effort normal and breath sounds normal.  Musculoskeletal: She exhibits no edema and no tenderness.  No acute joint changes noted  Myofascial trigger points are tender   Lymphadenopathy:    She has no cervical adenopathy.  Neurological: She is alert. She has normal reflexes. No cranial nerve deficit. Coordination normal.  Skin: Skin is warm and dry. No rash noted.  Psychiatric: She has a normal mood and affect.          Assessment & Plan:

## 2013-04-30 NOTE — Patient Instructions (Addendum)
Labs today for auto immune conditions and lyme  Continue peridex and also kenalog We will refer you to ENT at check out

## 2013-05-01 LAB — CBC WITH DIFFERENTIAL/PLATELET
Basophils Absolute: 0.1 10*3/uL (ref 0.0–0.1)
Basophils Relative: 1 % (ref 0–1)
Eosinophils Absolute: 0.3 10*3/uL (ref 0.0–0.7)
Eosinophils Relative: 4 % (ref 0–5)
HCT: 43.1 % (ref 36.0–46.0)
Hemoglobin: 14.5 g/dL (ref 12.0–15.0)
Lymphocytes Relative: 38 % (ref 12–46)
Lymphs Abs: 3.3 10*3/uL (ref 0.7–4.0)
MCH: 31.1 pg (ref 26.0–34.0)
MCHC: 33.6 g/dL (ref 30.0–36.0)
MCV: 92.5 fL (ref 78.0–100.0)
Monocytes Absolute: 0.7 10*3/uL (ref 0.1–1.0)
Monocytes Relative: 8 % (ref 3–12)
Neutro Abs: 4.3 10*3/uL (ref 1.7–7.7)
Neutrophils Relative %: 49 % (ref 43–77)
Platelets: 318 10*3/uL (ref 150–400)
RBC: 4.66 MIL/uL (ref 3.87–5.11)
RDW: 13.1 % (ref 11.5–15.5)
WBC: 8.5 10*3/uL (ref 4.0–10.5)

## 2013-05-01 LAB — RHEUMATOID FACTOR: Rhuematoid fact SerPl-aCnc: <10 IU/mL (ref <=14)

## 2013-05-02 NOTE — Assessment & Plan Note (Signed)
?   If worse than baseline with fibromyalgia and oa  Several tick bites this summer Rheum and tick labs today

## 2013-05-02 NOTE — Assessment & Plan Note (Signed)
Given several tick bites this summer and worse joint pain- pt is concerned about tick fever Also the apthous ulcers Lyme test today and update Will call if fever or new symptoms

## 2013-05-02 NOTE — Assessment & Plan Note (Signed)
Pt is concerned re; poss autoimmune etiology  This is a subsequent encounter Some imp with kenalog but now having gingivitis symptoms  Will continue peridex  Lab today  Ref to ENT for further eval

## 2013-05-03 DIAGNOSIS — K120 Recurrent oral aphthae: Secondary | ICD-10-CM | POA: Diagnosis not present

## 2013-05-03 LAB — ANA: Anti Nuclear Antibody(ANA): NEGATIVE

## 2013-05-03 LAB — B. BURGDORFI ANTIBODIES: B burgdorferi Ab IgG+IgM: 0.45 {ISR}

## 2013-05-05 ENCOUNTER — Encounter: Payer: Self-pay | Admitting: Family Medicine

## 2013-05-07 DIAGNOSIS — Z23 Encounter for immunization: Secondary | ICD-10-CM | POA: Diagnosis not present

## 2013-05-18 DIAGNOSIS — K120 Recurrent oral aphthae: Secondary | ICD-10-CM | POA: Diagnosis not present

## 2013-05-27 ENCOUNTER — Other Ambulatory Visit: Payer: Self-pay | Admitting: Otolaryngology

## 2013-05-27 DIAGNOSIS — D3701 Neoplasm of uncertain behavior of lip: Secondary | ICD-10-CM | POA: Diagnosis not present

## 2013-05-27 DIAGNOSIS — K120 Recurrent oral aphthae: Secondary | ICD-10-CM | POA: Diagnosis not present

## 2013-05-28 ENCOUNTER — Telehealth: Payer: Self-pay

## 2013-05-28 NOTE — Telephone Encounter (Signed)
Pt left v/m to update Dr Milinda Antis; pt saw Dr Pollyann Kennedy ENT and biopsy of mouth lesion done and results from biopsy should be back 06/02/13.

## 2013-05-28 NOTE — Telephone Encounter (Signed)
Thanks for letting me know!

## 2013-06-08 ENCOUNTER — Telehealth: Payer: Self-pay | Admitting: Family Medicine

## 2013-06-08 DIAGNOSIS — I10 Essential (primary) hypertension: Secondary | ICD-10-CM

## 2013-06-08 DIAGNOSIS — E785 Hyperlipidemia, unspecified: Secondary | ICD-10-CM

## 2013-06-08 DIAGNOSIS — E559 Vitamin D deficiency, unspecified: Secondary | ICD-10-CM

## 2013-06-08 DIAGNOSIS — R739 Hyperglycemia, unspecified: Secondary | ICD-10-CM

## 2013-06-08 DIAGNOSIS — E039 Hypothyroidism, unspecified: Secondary | ICD-10-CM

## 2013-06-08 DIAGNOSIS — M899 Disorder of bone, unspecified: Secondary | ICD-10-CM

## 2013-06-08 NOTE — Telephone Encounter (Signed)
Message copied by Judy Pimple on Tue Jun 08, 2013  8:21 PM ------      Message from: Alvina Chou      Created: Wed Jun 02, 2013  5:19 PM      Regarding: Lab orders for Wednesday, 10.15.14       Patient is scheduled for CPX labs, please order future labs, Thanks , Terri       ------

## 2013-06-09 ENCOUNTER — Other Ambulatory Visit: Payer: Self-pay | Admitting: Family Medicine

## 2013-06-09 ENCOUNTER — Other Ambulatory Visit (INDEPENDENT_AMBULATORY_CARE_PROVIDER_SITE_OTHER): Payer: Medicare Other

## 2013-06-09 DIAGNOSIS — M899 Disorder of bone, unspecified: Secondary | ICD-10-CM | POA: Diagnosis not present

## 2013-06-09 DIAGNOSIS — I10 Essential (primary) hypertension: Secondary | ICD-10-CM

## 2013-06-09 DIAGNOSIS — R739 Hyperglycemia, unspecified: Secondary | ICD-10-CM

## 2013-06-09 DIAGNOSIS — E559 Vitamin D deficiency, unspecified: Secondary | ICD-10-CM

## 2013-06-09 DIAGNOSIS — R7309 Other abnormal glucose: Secondary | ICD-10-CM

## 2013-06-09 DIAGNOSIS — E039 Hypothyroidism, unspecified: Secondary | ICD-10-CM

## 2013-06-09 DIAGNOSIS — E785 Hyperlipidemia, unspecified: Secondary | ICD-10-CM

## 2013-06-09 LAB — HEMOGLOBIN A1C: Hgb A1c MFr Bld: 5.6 % (ref 4.6–6.5)

## 2013-06-09 LAB — COMPREHENSIVE METABOLIC PANEL
ALT: 20 U/L (ref 0–35)
AST: 20 U/L (ref 0–37)
Albumin: 3.6 g/dL (ref 3.5–5.2)
Alkaline Phosphatase: 59 U/L (ref 39–117)
BUN: 16 mg/dL (ref 6–23)
CO2: 26 mEq/L (ref 19–32)
Calcium: 9.5 mg/dL (ref 8.4–10.5)
Chloride: 105 mEq/L (ref 96–112)
Creatinine, Ser: 1 mg/dL (ref 0.4–1.2)
GFR: 55.68 mL/min — ABNORMAL LOW (ref >60.00)
Glucose, Bld: 91 mg/dL (ref 70–99)
Potassium: 4.6 mEq/L (ref 3.5–5.1)
Sodium: 138 mEq/L (ref 135–145)
Total Bilirubin: 0.6 mg/dL (ref 0.3–1.2)
Total Protein: 6.6 g/dL (ref 6.0–8.3)

## 2013-06-09 LAB — CBC WITH DIFFERENTIAL/PLATELET
Basophils Absolute: 0.1 10*3/uL (ref 0.0–0.1)
Basophils Relative: 0.8 % (ref 0.0–3.0)
Eosinophils Absolute: 0.3 10*3/uL (ref 0.0–0.7)
Eosinophils Relative: 4.2 % (ref 0.0–5.0)
HCT: 42.1 % (ref 36.0–46.0)
Hemoglobin: 14.1 g/dL (ref 12.0–15.0)
Lymphocytes Relative: 42.4 % (ref 12.0–46.0)
Lymphs Abs: 3.1 10*3/uL (ref 0.7–4.0)
MCHC: 33.5 g/dL (ref 30.0–36.0)
MCV: 93.3 fl (ref 78.0–100.0)
Monocytes Absolute: 0.6 10*3/uL (ref 0.1–1.0)
Monocytes Relative: 7.6 % (ref 3.0–12.0)
Neutro Abs: 3.3 10*3/uL (ref 1.4–7.7)
Neutrophils Relative %: 45 % (ref 43.0–77.0)
Platelets: 295 10*3/uL (ref 150.0–400.0)
RBC: 4.51 Mil/uL (ref 3.87–5.11)
RDW: 13.1 % (ref 11.5–14.6)
WBC: 7.4 10*3/uL (ref 4.5–10.5)

## 2013-06-09 LAB — LIPID PANEL
Cholesterol: 217 mg/dL — ABNORMAL HIGH (ref 0–200)
HDL: 65.7 mg/dL (ref >39.00)
Total CHOL/HDL Ratio: 3
Triglycerides: 106 mg/dL (ref 0.0–149.0)
VLDL: 21.2 mg/dL (ref 0.0–40.0)

## 2013-06-09 LAB — LDL CHOLESTEROL, DIRECT: Direct LDL: 125.7 mg/dL

## 2013-06-09 LAB — TSH: TSH: 3.56 u[IU]/mL (ref 0.35–5.50)

## 2013-06-10 LAB — VITAMIN D 25 HYDROXY (VIT D DEFICIENCY, FRACTURES): Vit D, 25-Hydroxy: 49 ng/mL (ref 30–89)

## 2013-06-16 ENCOUNTER — Ambulatory Visit (INDEPENDENT_AMBULATORY_CARE_PROVIDER_SITE_OTHER): Payer: Medicare Other | Admitting: Family Medicine

## 2013-06-16 ENCOUNTER — Encounter: Payer: Self-pay | Admitting: Family Medicine

## 2013-06-16 VITALS — BP 110/76 | HR 75 | Temp 98.3°F | Ht 63.0 in | Wt 199.0 lb

## 2013-06-16 DIAGNOSIS — R7309 Other abnormal glucose: Secondary | ICD-10-CM

## 2013-06-16 DIAGNOSIS — E559 Vitamin D deficiency, unspecified: Secondary | ICD-10-CM | POA: Diagnosis not present

## 2013-06-16 DIAGNOSIS — I10 Essential (primary) hypertension: Secondary | ICD-10-CM

## 2013-06-16 DIAGNOSIS — E039 Hypothyroidism, unspecified: Secondary | ICD-10-CM

## 2013-06-16 DIAGNOSIS — R739 Hyperglycemia, unspecified: Secondary | ICD-10-CM

## 2013-06-16 DIAGNOSIS — Z0000 Encounter for general adult medical examination without abnormal findings: Secondary | ICD-10-CM

## 2013-06-16 DIAGNOSIS — E785 Hyperlipidemia, unspecified: Secondary | ICD-10-CM

## 2013-06-16 DIAGNOSIS — K120 Recurrent oral aphthae: Secondary | ICD-10-CM | POA: Diagnosis not present

## 2013-06-16 DIAGNOSIS — M899 Disorder of bone, unspecified: Secondary | ICD-10-CM | POA: Diagnosis not present

## 2013-06-16 MED ORDER — LEVOTHYROXINE SODIUM 88 MCG PO TABS: 88.0000 ug | ORAL_TABLET | Freq: Every day | ORAL | Status: DC

## 2013-06-16 MED ORDER — LISINOPRIL 10 MG PO TABS: 10.0000 mg | ORAL_TABLET | Freq: Every day | ORAL | Status: DC

## 2013-06-16 MED ORDER — ESTRADIOL 1 MG PO TABS: 1.0000 mg | ORAL_TABLET | Freq: Every day | ORAL | Status: DC

## 2013-06-16 MED ORDER — MONTELUKAST SODIUM 10 MG PO TABS: 10.0000 mg | ORAL_TABLET | Freq: Every day | ORAL | Status: DC

## 2013-06-16 NOTE — Progress Notes (Signed)
Subjective:    Patient ID: Linda Mcgrath, female    DOB: 30-Apr-1933, 79 y.o.   MRN: [id number]  HPI I have personally reviewed the Medicare Annual Wellness questionnaire and have noted 1. The patient's medical and social history 2. Their use of alcohol, tobacco or illicit drugs 3. Their current medications and supplements 4. The patient's functional ability including ADL's, fall risks, home safety risks and hearing or visual             impairment. 5. Diet and physical activities 6. Evidence for depression or mood disorders  The patients weight, height, BMI have been recorded in the chart and visual acuity is per eye clinic.  I have made referrals, counseling and provided education to the patient based review of the above and I have provided the pt with a written personalized care plan for preventive services.  Still dealing with apthous ulcers in mouth- had inconclusive biopsy from ENT - may have to repeat it  Using peridex and also kenalog / oragel paste  Waiting for bx site to heal Has appt with dentist on Nov 4th  - may refer her to a periodontist She hopes it will clear up by itself   Wt is down 2 lb with bmi of 35  See scanned forms.  Routine anticipatory guidance given to patient.  See health maintenance. Flu-had it 05/07/13 Shingles vacine 8/09 PNA vaccine 1/05 Tetanus 5/05 vaccine  Colon 7/14- has family hx of colon cancer  Breast cancer screening 11/13  Self breast exam no lumps or changes  No gyn problems (has baseline incontinence for urge incontinence - wears a light pad)  Advance directive- has a living will set up  Cognitive function addressed- see scanned forms- and if abnormal then additional documentation follows. (is very mentally sharp-no problems)   Labs  Hyperglycemia stable Lab Results  Component Value Date   HGBA1C 5.6 06/09/2013     Lab Results  Component Value Date   CHOL 217* 06/09/2013   CHOL 195 06/10/2012   CHOL 205* 11/27/2011    Lab Results  Component Value Date   HDL 65.70 06/09/2013   HDL 63.50 06/10/2012   HDL 74.90 11/27/2011   Lab Results  Component Value Date   LDLCALC 110* 06/10/2012   Lab Results  Component Value Date   TRIG 106.0 06/09/2013   TRIG 110.0 06/10/2012   TRIG 82.0 11/27/2011   Lab Results  Component Value Date   CHOLHDL 3 06/09/2013   CHOLHDL 3 06/10/2012   CHOLHDL 3 11/27/2011   Lab Results  Component Value Date   LDLDIRECT 125.7 06/09/2013   LDLDIRECT 124.0 11/27/2011   LDLDIRECT 127.3 11/27/2010   overall stable chol profile with high HDL  Eats a healthy diet    Hypothyroidism  Pt has no clinical changes No change in energy level/ hair or skin/ edema and no tremor Lab Results  Component Value Date   TSH 3.56 06/09/2013    dexa last showed osteopenia of hip with a little imp Takes ca and mag and D D level 49  Also tries to walk and work in the yard for exercise and all her own housework   PMH and SH reviewed  Meds, vitals, and allergies reviewed.   ROS: See HPI.  Otherwise negative.    Patient Active Problem List   Diagnosis Date Noted  . Joint pain 04/30/2013  . Aphthous ulcer of mouth 03/16/2013  . Tick bite 01/25/2013  . Post-nasal drip 11/11/2012  .  Other screening mammogram 06/15/2012  . Conjunctival hemorrhage of right eye 10/09/2011  . Hyperglycemia 06/03/2011  . Leg pain 05/07/2011  . HYPERTENSION, BENIGN ESSENTIAL 10/23/2010  . CONSTIPATION 10/23/2010  . HEMATOCHEZIA 10/23/2010  . OSTEOARTHRITIS, HANDS, BILATERAL 01/24/2010  . HYPOTHYROIDISM 08/24/2008  . UNSPECIFIED VITAMIN D DEFICIENCY 06/06/2008  . HYPERLIPIDEMIA 06/06/2008  . MENOPAUSAL SYNDROME 03/29/2008  . OSTEOPENIA 03/29/2008  . ALLERGIC RHINITIS 01/25/2008  . IBS 01/25/2008  . OSTEOARTHRITIS 01/25/2008  . FIBROMYALGIA 01/25/2008   Past Medical History  Diagnosis Date  . Osteoarthritis   . Fibromyalgia   . Hypertension   . Hypothyroidism   . Hyperlipidemia   . Internal  hemorrhoids   . Colon polyp 1999    small polyp  . Diverticulosis   . Lung nodule     stable LUL 9 mm  . Cataract     Bil/lens implant  . Allergic rhinitis   . Leg cramps   . Menopausal syndrome   . UTI (urinary tract infection)    Past Surgical History  Procedure Laterality Date  . Abdominal hysterectomy  1975    total-fibroids  . Rotator cuff repair      x 2 /right shoulder  . Skin cancer excision      pre-melanoma / on face  . Breast biopsy      benign  . Cataract extraction      bilateral  . Abd u/s  11/1998    negative  . Abd u/s  01/2001    gallbladder polyps  . Hida scan  01/2001    Negative  . Colonoscopy  1998    Diverticulosis; polyp\  . Dexa  10/2001    osteopenia  . Esophagogastroduodenoscopy  2004  . Colonoscopy  09/2002    Diverticulosis, hem  . Colonoscopy  12/2007    diverticulosis, polyp  . Appendectomy  1975  . Cholecystectomy    . Knee surgery      right knee / 11/2004   History  Substance Use Topics  . Smoking status: Never Smoker   . Smokeless tobacco: Never Used  . Alcohol Use: No     Comment: occasional   Family History  Problem Relation Age of Onset  . Parkinsonism Father   . Colon cancer Brother   . Kidney failure Son     congenital  . Allergies Mother   . Heart disease Mother    Allergies  Allergen Reactions  . Amlodipine Besylate     REACTION: muscle spasms, extremity swelling, low bp  . Amoxicillin   . Ciprofloxacin     REACTION: muscle spasms, insomnia  . Codeine     REACTION: hallucinations  . Penicillins   . Propoxyphene Hcl   . Sulfamethoxazole-Trimethoprim     REACTION: dizzy, could not focus eyes and could not concentrate and nausea  . Tetracycline     REACTION: heart palpitations,muscle spasms   Current Outpatient Prescriptions on File Prior to Visit  Medication Sig Dispense Refill  . albuterol (PROVENTIL HFA;VENTOLIN HFA) 108 (90 BASE) MCG/ACT inhaler Inhale 2 puffs into the lungs every 4 (four) hours as  needed for wheezing.  1 Inhaler  3  . Ascorbic Acid (VITAMIN C) 1000 MG tablet Take 1,000 mg by mouth daily.        . B Complex Vitamins (VITAMIN B COMPLEX PO) Take 1 tablet by mouth daily.      . Biotin 10 MG TABS Take by mouth. daily       . Calcium Carbonate-Vit  D-Min 600-400 MG-UNIT TABS Take by mouth. daily       . cetirizine (ZYRTEC) 10 MG tablet Take 10 mg by mouth daily as needed for allergies.      . chlorhexidine (PERIDEX) 0.12 % solution RINSE TWICE DAILY AFTER BRUSHING USING A CAPFUL AS MARKED      . Cholecalciferol (VITAMIN D) 2000 UNITS CAPS Take 1 capsule by mouth 2 (two) times daily.        . COCONUT OIL PO Take by mouth. Take 2 pills daily      . Coenzyme Q10 (CO Q 10) 100 MG CAPS Take 1 tablet by mouth daily.      . Cranberry 500 MG CAPS Take by mouth. daily       . cyclobenzaprine (FLEXERIL) 10 MG tablet Take one tablet by mouth nightly at bedtime  as needed  30 tablet  5  . estradiol (ESTRACE) 1 MG tablet Take 1 tablet (1 mg total) by mouth daily.  30 tablet  11  . Flax OIL Take by mouth. daily       . ibuprofen (ADVIL,MOTRIN) 200 MG tablet Take 400 mg by mouth 2 (two) times daily as needed.        Marland Kitchen levothyroxine (SYNTHROID, LEVOTHROID) 88 MCG tablet Take 88 mcg by mouth daily before breakfast.      . lisinopril (PRINIVIL,ZESTRIL) 10 MG tablet Take one tablet by mouth every morning  30 tablet  5  . loratadine-pseudoephedrine (CLARITIN-D 24-HOUR) 10-240 MG per 24 hr tablet Take 1 tablet by mouth daily.      . Lutein 20 MG CAPS Take by mouth. daily       . montelukast (SINGULAIR) 10 MG tablet Take 1 tablet (10 mg total) by mouth at bedtime.  30 tablet  11  . Multiple Vitamin (MULTIVITAMIN) capsule Take 1 capsule by mouth daily.        Marland Kitchen triamcinolone (KENALOG) 0.1 % paste Place 1 application onto teeth 2 (two) times daily.  5 g  2  . triamcinolone (NASACORT) 55 MCG/ACT nasal inhaler Place 2 sprays into the nose as needed.       . vitamin B-12 (CYANOCOBALAMIN) 500 MCG tablet  Take 500 mcg by mouth daily.       No current facility-administered medications on file prior to visit.     Review of Systems Review of Systems  Constitutional: Negative for fever, appetite change, fatigue and unexpected weight change.  Eyes: Negative for pain and visual disturbance.  eNT pos for allergic rhinorrhea , pos for mouth ulcers that come and go , also gum disease Respiratory: Negative for cough and shortness of breath.   Cardiovascular: Negative for cp or palpitations    Gastrointestinal: Negative for nausea, diarrhea and constipation.  Genitourinary: Negative for urgency and frequency.  Skin: Negative for pallor or rash   Neurological: Negative for weakness, light-headedness, numbness and headaches.  Hematological: Negative for adenopathy. Does not bruise/bleed easily.  Psychiatric/Behavioral: Negative for dysphoric mood. The patient is not nervous/anxious.         Objective:   Physical Exam  Constitutional: She appears well-developed and well-nourished. No distress.  obese and well appearing   HENT:  Head: Normocephalic and atraumatic.  Right Ear: External ear normal.  Left Ear: External ear normal.  Mouth/Throat: Oropharynx is clear and moist.  Eyes: Conjunctivae and EOM are normal. Pupils are equal, round, and reactive to light. No scleral icterus.  Neck: Normal range of motion. Neck supple. No JVD present. Carotid  bruit is not present. No thyromegaly present.  Cardiovascular: Normal rate, regular rhythm, normal heart sounds and intact distal pulses.  Exam reveals no gallop.   Pulmonary/Chest: Effort normal and breath sounds normal. No respiratory distress. She has no wheezes. She exhibits no tenderness.  Abdominal: Soft. Bowel sounds are normal. She exhibits no distension, no abdominal bruit and no mass. There is no tenderness.  Genitourinary: No breast swelling, tenderness, discharge or bleeding.  Musculoskeletal: Normal range of motion. She exhibits no edema and  no tenderness.  Few varicosities   Lymphadenopathy:    She has no cervical adenopathy.  Neurological: She is alert. She has normal reflexes. No cranial nerve deficit. She exhibits normal muscle tone. Coordination normal.  Skin: Skin is warm and dry. No rash noted. No erythema. No pallor.  sks diffusely on back and trunk   Psychiatric: She has a normal mood and affect.  Pleasant and mentally sharp          Assessment & Plan:

## 2013-06-16 NOTE — Patient Instructions (Addendum)
Don't forget to schedule your annual mammogram  Take care of yourself  I refilled your medicines

## 2013-06-17 ENCOUNTER — Other Ambulatory Visit: Payer: Self-pay

## 2013-06-17 DIAGNOSIS — Z1231 Encounter for screening mammogram for malignant neoplasm of breast: Secondary | ICD-10-CM

## 2013-06-17 DIAGNOSIS — Z0000 Encounter for general adult medical examination without abnormal findings: Secondary | ICD-10-CM | POA: Insufficient documentation

## 2013-06-17 NOTE — Assessment & Plan Note (Signed)
D level is 49 with current suppl Disc imp to bone and overall health

## 2013-06-17 NOTE — Assessment & Plan Note (Signed)
Hypothyroidism  Pt has no clinical changes No change in energy level/ hair or skin/ edema and no tremor Lab Results  Component Value Date   TSH 3.56 06/09/2013

## 2013-06-17 NOTE — Assessment & Plan Note (Signed)
Lab Results  Component Value Date   HGBA1C 5.6 06/09/2013   Stable  Disc need for wt loss and low glycemic diet

## 2013-06-17 NOTE — Assessment & Plan Note (Signed)
bp in fair control at this time  No changes needed  Disc lifstyle change with low sodium diet and exercise  Labs reviewed  

## 2013-06-17 NOTE — Assessment & Plan Note (Signed)
Reviewed health habits including diet and exercise and skin cancer prevention Also reviewed health mt list, fam hx and immunizations  See HPI Wt loss recommended Wellness labs rev

## 2013-06-17 NOTE — Assessment & Plan Note (Signed)
Intermittent and improved today- will continue ENT f/u and to see dentist soon for periodontal problems as well

## 2013-06-17 NOTE — Assessment & Plan Note (Signed)
slt imp with dexa 7/13 Disc need for calcium/ vitamin D/ wt bearing exercise and bone density test every 2 y to monitor Disc safety/ fracture risk in detail

## 2013-06-17 NOTE — Assessment & Plan Note (Signed)
Disc goals for lipids and reasons to control them Rev labs with pt Rev low sat fat diet in detail Good HDL for better ratio

## 2013-06-21 DIAGNOSIS — M545 Low back pain, unspecified: Secondary | ICD-10-CM | POA: Diagnosis not present

## 2013-06-21 DIAGNOSIS — M999 Biomechanical lesion, unspecified: Secondary | ICD-10-CM | POA: Diagnosis not present

## 2013-07-08 DIAGNOSIS — H4011X0 Primary open-angle glaucoma, stage unspecified: Secondary | ICD-10-CM | POA: Diagnosis not present

## 2013-07-08 DIAGNOSIS — H409 Unspecified glaucoma: Secondary | ICD-10-CM | POA: Diagnosis not present

## 2013-07-28 ENCOUNTER — Ambulatory Visit
Admission: RE | Admit: 2013-07-28 | Discharge: 2013-07-28 | Disposition: A | Discharge status: Home / Self Care | Payer: Medicare Other | Source: Ambulatory Visit

## 2013-07-28 DIAGNOSIS — Z1231 Encounter for screening mammogram for malignant neoplasm of breast: Secondary | ICD-10-CM | POA: Diagnosis not present

## 2013-09-29 DIAGNOSIS — H40019 Open angle with borderline findings, low risk, unspecified eye: Secondary | ICD-10-CM | POA: Diagnosis not present

## 2013-09-29 DIAGNOSIS — Z961 Presence of intraocular lens: Secondary | ICD-10-CM | POA: Diagnosis not present

## 2013-10-09 ENCOUNTER — Other Ambulatory Visit: Payer: Self-pay | Admitting: Family Medicine

## 2013-10-11 NOTE — Telephone Encounter (Signed)
Will refill electronically  

## 2013-10-11 NOTE — Telephone Encounter (Signed)
Electronic refill request, please advise  

## 2013-10-25 DIAGNOSIS — M545 Low back pain, unspecified: Secondary | ICD-10-CM | POA: Diagnosis not present

## 2013-10-25 DIAGNOSIS — M546 Pain in thoracic spine: Secondary | ICD-10-CM | POA: Diagnosis not present

## 2013-10-25 DIAGNOSIS — M9981 Other biomechanical lesions of cervical region: Secondary | ICD-10-CM | POA: Diagnosis not present

## 2013-10-25 DIAGNOSIS — M999 Biomechanical lesion, unspecified: Secondary | ICD-10-CM | POA: Diagnosis not present

## 2013-10-25 DIAGNOSIS — M542 Cervicalgia: Secondary | ICD-10-CM | POA: Diagnosis not present

## 2013-10-27 DIAGNOSIS — M542 Cervicalgia: Secondary | ICD-10-CM | POA: Diagnosis not present

## 2013-10-27 DIAGNOSIS — M999 Biomechanical lesion, unspecified: Secondary | ICD-10-CM | POA: Diagnosis not present

## 2013-10-27 DIAGNOSIS — M545 Low back pain, unspecified: Secondary | ICD-10-CM | POA: Diagnosis not present

## 2013-10-27 DIAGNOSIS — M9981 Other biomechanical lesions of cervical region: Secondary | ICD-10-CM | POA: Diagnosis not present

## 2013-10-27 DIAGNOSIS — M546 Pain in thoracic spine: Secondary | ICD-10-CM | POA: Diagnosis not present

## 2013-10-28 DIAGNOSIS — M9981 Other biomechanical lesions of cervical region: Secondary | ICD-10-CM | POA: Diagnosis not present

## 2013-10-28 DIAGNOSIS — M545 Low back pain, unspecified: Secondary | ICD-10-CM | POA: Diagnosis not present

## 2013-10-28 DIAGNOSIS — M999 Biomechanical lesion, unspecified: Secondary | ICD-10-CM | POA: Diagnosis not present

## 2013-10-28 DIAGNOSIS — M542 Cervicalgia: Secondary | ICD-10-CM | POA: Diagnosis not present

## 2013-10-28 DIAGNOSIS — M546 Pain in thoracic spine: Secondary | ICD-10-CM | POA: Diagnosis not present

## 2013-11-01 DIAGNOSIS — M542 Cervicalgia: Secondary | ICD-10-CM | POA: Diagnosis not present

## 2013-11-01 DIAGNOSIS — M9981 Other biomechanical lesions of cervical region: Secondary | ICD-10-CM | POA: Diagnosis not present

## 2013-11-01 DIAGNOSIS — M545 Low back pain, unspecified: Secondary | ICD-10-CM | POA: Diagnosis not present

## 2013-11-01 DIAGNOSIS — M546 Pain in thoracic spine: Secondary | ICD-10-CM | POA: Diagnosis not present

## 2013-11-01 DIAGNOSIS — M999 Biomechanical lesion, unspecified: Secondary | ICD-10-CM | POA: Diagnosis not present

## 2013-11-12 ENCOUNTER — Ambulatory Visit (INDEPENDENT_AMBULATORY_CARE_PROVIDER_SITE_OTHER): Payer: Medicare Other | Admitting: Internal Medicine

## 2013-11-12 ENCOUNTER — Encounter: Payer: Self-pay | Admitting: Internal Medicine

## 2013-11-12 VITALS — BP 110/68 | HR 85 | Temp 97.9°F | Wt 201.8 lb

## 2013-11-12 DIAGNOSIS — S81009A Unspecified open wound, unspecified knee, initial encounter: Secondary | ICD-10-CM | POA: Diagnosis not present

## 2013-11-12 DIAGNOSIS — S81812A Laceration without foreign body, left lower leg, initial encounter: Secondary | ICD-10-CM

## 2013-11-12 DIAGNOSIS — S81809A Unspecified open wound, unspecified lower leg, initial encounter: Secondary | ICD-10-CM

## 2013-11-12 DIAGNOSIS — S91009A Unspecified open wound, unspecified ankle, initial encounter: Secondary | ICD-10-CM

## 2013-11-12 MED ORDER — MUPIROCIN 2 % EX OINT: 1.0000 "application " | TOPICAL_OINTMENT | Freq: Two times a day (BID) | CUTANEOUS | Status: DC

## 2013-11-12 NOTE — Progress Notes (Signed)
Pre visit review using our clinic review tool, if applicable. No additional management support is needed unless otherwise documented below in the visit note. 

## 2013-11-12 NOTE — Patient Instructions (Signed)
Skin Tear Care  A skin tear is a wound in which the top layer of skin has peeled off. This is a common problem with aging because the skin becomes thinner and more fragile as a person gets older. In addition, some medicines, such as oral corticosteroids, can lead to skin thinning if taken for long periods of time.   A skin tear is often repaired with tape or skin adhesive strips. This keeps the skin that has been peeled off in contact with the healthier skin beneath. Depending on the location of the wound, a bandage (dressing) may be applied over the tape or skin adhesive strips. Sometimes, during the healing process, the skin turns black and dies. Even when this happens, the torn skin acts as a good dressing until the skin underneath gets healthier and repairs itself.  HOME CARE INSTRUCTIONS   · Change dressings once per day or as directed by your caregiver.  · Gently clean the skin tear and the area around the tear using saline solution or mild soap and water.  · Do not rub the injured skin dry. Let the area air dry.  · Apply petroleum jelly or an antibiotic cream or ointment to keep the tear moist. This will help the wound heal. Do not allow a scab to form.  · If the dressing sticks before the next dressing change, moisten it with warm soapy water and gently remove it.  · Protect the injured skin until it has healed.  · Only take over-the-counter or prescription medicines as directed by your caregiver.  · Take showers or baths using warm soapy water. Apply a new dressing after the shower or bath.  · Keep all follow-up appointments as directed by your caregiver.    SEEK IMMEDIATE MEDICAL CARE IF:   · You have redness, swelling, or increasing pain in the skin tear.  · You have pus coming from the skin tear.  · You have chills.  · You have a red streak that goes away from the skin tear.  · You have a bad smell coming from the tear or dressing.  · You have a fever or persistent symptoms for more than 2 3 days.  · You  have a fever and your symptoms suddenly get worse.  MAKE SURE YOU:  · Understand these instructions.  · Will watch this condition.  · Will get help right away if your child is not doing well or gets worse.  Document Released: 05/07/2001 Document Revised: 05/06/2012 Document Reviewed: 02/24/2012  [employer]® Patient Information ©2014 [employer].

## 2013-11-12 NOTE — Progress Notes (Signed)
Subjective:    Patient ID: Linda Mcgrath, female    DOB: 1932-10-15, 80 y.o.   MRN: [id number]  HPI  Pt presents to the clinic today with c/o a rash on her left lower leg. She reports that is started out as a bruise. She noticed this 2-3 weeks ago. She does not specifically remember any injury to the area. The rash is very itchy. She has tried benadryl and antibiotic ointment.   Review of Systems      Past Medical History  Diagnosis Date  . Osteoarthritis   . Fibromyalgia   . Hypertension   . Hypothyroidism   . Hyperlipidemia   . Internal hemorrhoids   . Colon polyp 1999    small polyp  . Diverticulosis   . Lung nodule     stable LUL 9 mm  . Cataract     Bil/lens implant  . Allergic rhinitis   . Leg cramps   . Menopausal syndrome   . UTI (urinary tract infection)     Current Outpatient Prescriptions  Medication Sig Dispense Refill  . albuterol (PROVENTIL HFA;VENTOLIN HFA) 108 (90 BASE) MCG/ACT inhaler Inhale 2 puffs into the lungs every 4 (four) hours as needed for wheezing.  1 Inhaler  3  . Ascorbic Acid (VITAMIN C) 1000 MG tablet Take 1,000 mg by mouth daily.        . B Complex Vitamins (VITAMIN B COMPLEX PO) Take 1 tablet by mouth daily.      . Biotin 10 MG TABS Take by mouth. daily       . Calcium Carbonate-Vit D-Min 600-400 MG-UNIT TABS Take by mouth. daily       . cetirizine (ZYRTEC) 10 MG tablet Take 10 mg by mouth daily as needed for allergies.      . chlorhexidine (PERIDEX) 0.12 % solution RINSE TWICE DAILY AFTER BRUSHING USING A CAPFUL AS MARKED      . Cholecalciferol (VITAMIN D) 2000 UNITS CAPS Take 1 capsule by mouth 2 (two) times daily.        . Cranberry 500 MG CAPS Take by mouth. daily      . cyclobenzaprine (FLEXERIL) 10 MG tablet TAKE ONE TABLET BY MOUTH AT BEDTIME AS NEEDED   30 tablet  4  . estradiol (ESTRACE) 1 MG tablet Take 1 tablet (1 mg total) by mouth daily.  90 tablet  3  . Flax OIL Take by mouth. daily       . ibuprofen  (ADVIL,MOTRIN) 200 MG tablet Take 400 mg by mouth 2 (two) times daily as needed.        Marland Kitchen levothyroxine (SYNTHROID, LEVOTHROID) 88 MCG tablet Take 1 tablet (88 mcg total) by mouth daily before breakfast.  90 tablet  3  . lisinopril (PRINIVIL,ZESTRIL) 10 MG tablet Take 1 tablet (10 mg total) by mouth daily.  90 tablet  3  . loratadine-pseudoephedrine (CLARITIN-D 24-HOUR) 10-240 MG per 24 hr tablet Take 1 tablet by mouth daily.      . Lutein 20 MG CAPS Take by mouth. daily       . montelukast (SINGULAIR) 10 MG tablet Take 1 tablet (10 mg total) by mouth at bedtime.  90 tablet  3  . Multiple Vitamin (MULTIVITAMIN) capsule Take 1 capsule by mouth daily.        Marland Kitchen triamcinolone (KENALOG) 0.1 % paste Place 1 application onto teeth 2 (two) times daily.  5 g  2  . triamcinolone (NASACORT) 55 MCG/ACT nasal inhaler Place 2  sprays into the nose as needed.       . vitamin B-12 (CYANOCOBALAMIN) 500 MCG tablet Take 500 mcg by mouth daily.      . mupirocin ointment (BACTROBAN) 2 % Place 1 application into the nose 2 (two) times daily.  22 g  0   No current facility-administered medications for this visit.    Allergies  Allergen Reactions  . Amlodipine Besylate     REACTION: muscle spasms, extremity swelling, low bp  . Amoxicillin   . Ciprofloxacin     REACTION: muscle spasms, insomnia  . Codeine     REACTION: hallucinations  . Penicillins   . Propoxyphene Hcl   . Sulfamethoxazole-Trimethoprim     REACTION: dizzy, could not focus eyes and could not concentrate and nausea  . Tetracycline     REACTION: heart palpitations,muscle spasms    Family History  Problem Relation Age of Onset  . Parkinsonism Father   . Colon cancer Brother   . Kidney failure Son     congenital  . Allergies Mother   . Heart disease Mother     History   Social History  . Marital Status: Married    Spouse Name: N/A    Number of Children: 2  . Years of Education: N/A   Occupational History  . TRAVEL AGENT     Social History Main Topics  . Smoking status: Never Smoker   . Smokeless tobacco: Never Used  . Alcohol Use: No     Comment: occasional  . Drug Use: No  . Sexual Activity: Not on file   Other Topics Concern  . Not on file   Social History Narrative   Non-smoker      occ alcohol      Married-husband does the cooking      Works outside the home     Constitutional: Denies fever, malaise, fatigue, headache or abrupt weight changes.   Skin: Pt reports skin tear to left lower extremity.    No other specific complaints in a complete review of systems (except as listed in HPI above).  Objective:   Physical Exam  BP 110/68  Pulse 85  Temp(Src) 97.9 F (36.6 C) (Oral)  Wt 201 lb 12 oz (91.513 kg)  SpO2 98%  LMP 08/26/1969 Wt Readings from Last 3 Encounters:  11/12/13 201 lb 12 oz (91.513 kg)  06/16/13 199 lb (90.266 kg)  04/30/13 201 lb (91.173 kg)    General: Appears her stated age, well developed, well nourished in NAD. Skin: Small dime size circular skin tear noted on left leg. No infection noted. Cardiovascular: Normal rate and rhythm. S1,S2 noted.  No murmur, rubs or gallops noted. No JVD or BLE edema. No carotid bruits noted. Pulmonary/Chest: Normal effort and positive vesicular breath sounds. No respiratory distress. No wheezes, rales or ronchi noted.    BMET    Component Value Date/Time   NA 138 06/09/2013 0811   K 4.6 06/09/2013 0811   CL 105 06/09/2013 0811   CO2 26 06/09/2013 0811   GLUCOSE 91 06/09/2013 0811   BUN 16 06/09/2013 0811   CREATININE 1.0 06/09/2013 0811   CALCIUM 9.5 06/09/2013 0811   GFRNONAA >60 10/05/2009 1449   GFRAA  Value: >60        The eGFR has been calculated using the MDRD equation. This calculation has not been validated in all clinical situations. eGFR's persistently <60 mL/min signify possible Chronic Kidney Disease. 10/05/2009 1449    Lipid Panel  Component Value Date/Time   CHOL 217* 06/09/2013 0811   TRIG 106.0  06/09/2013 0811   HDL 65.70 06/09/2013 0811   CHOLHDL 3 06/09/2013 0811   VLDL 21.2 06/09/2013 0811   LDLCALC 110* 06/10/2012 0757    CBC    Component Value Date/Time   WBC 7.4 06/09/2013 0811   RBC 4.51 06/09/2013 0811   HGB 14.1 06/09/2013 0811   HCT 42.1 06/09/2013 0811   PLT 295.0 06/09/2013 0811   MCV 93.3 06/09/2013 0811   MCH 31.1 04/30/2013 1434   MCHC 33.5 06/09/2013 0811   RDW 13.1 06/09/2013 0811   LYMPHSABS 3.1 06/09/2013 0811   MONOABS 0.6 06/09/2013 0811   EOSABS 0.3 06/09/2013 0811   BASOSABS 0.1 06/09/2013 0811    Hgb A1C Lab Results  Component Value Date   HGBA1C 5.6 06/09/2013         Assessment & Plan:   Skin tear of left lower leg:  eRx for bactroban bid until healed Avoid scratching if you can OK to continue benadryl if needed  RTC as needed or if symptoms persist or worsen

## 2013-11-16 DIAGNOSIS — L989 Disorder of the skin and subcutaneous tissue, unspecified: Secondary | ICD-10-CM | POA: Diagnosis not present

## 2013-11-16 DIAGNOSIS — L539 Erythematous condition, unspecified: Secondary | ICD-10-CM | POA: Diagnosis not present

## 2013-11-16 DIAGNOSIS — L280 Lichen simplex chronicus: Secondary | ICD-10-CM | POA: Diagnosis not present

## 2013-11-16 DIAGNOSIS — L988 Other specified disorders of the skin and subcutaneous tissue: Secondary | ICD-10-CM | POA: Diagnosis not present

## 2013-11-16 DIAGNOSIS — L439 Lichen planus, unspecified: Secondary | ICD-10-CM | POA: Diagnosis not present

## 2013-12-20 ENCOUNTER — Encounter: Payer: Self-pay | Admitting: Family Medicine

## 2013-12-20 ENCOUNTER — Ambulatory Visit (INDEPENDENT_AMBULATORY_CARE_PROVIDER_SITE_OTHER): Payer: Medicare Other | Admitting: Family Medicine

## 2013-12-20 VITALS — BP 116/72 | HR 91 | Temp 98.4°F | Ht 63.0 in | Wt 200.2 lb

## 2013-12-20 DIAGNOSIS — L439 Lichen planus, unspecified: Secondary | ICD-10-CM | POA: Diagnosis not present

## 2013-12-20 DIAGNOSIS — J019 Acute sinusitis, unspecified: Secondary | ICD-10-CM | POA: Insufficient documentation

## 2013-12-20 DIAGNOSIS — R05 Cough: Secondary | ICD-10-CM | POA: Diagnosis not present

## 2013-12-20 DIAGNOSIS — R059 Cough, unspecified: Secondary | ICD-10-CM

## 2013-12-20 MED ORDER — AZITHROMYCIN 250 MG PO TABS: ORAL_TABLET | ORAL | Status: DC

## 2013-12-20 NOTE — Assessment & Plan Note (Signed)
Ongoing and worse with sinusitis  Adv to hold ace in case of ace cough for at least a week and update Will change it if this seems to be a cause

## 2013-12-20 NOTE — Progress Notes (Signed)
Subjective:    Patient ID: Linda Mcgrath, female    DOB: Aug 15, 1933, 80 y.o.   MRN: [id number]  HPI Here with a cough - thinks she has had bronchitis  Using flonase and claritin  Has a tickle in throat -and mucous is very thick - green and sticky  Does not have a deep cough  She is on an ace   Going on since early March  Then cough became much worse recently  otc cough med - robitussin  Coughed all night  A little sinus pain or pressure    Going to [facility] and was dx with lichen planus - both in mouth and her body -- [facility] dermatologist Dr Joslyn Hy Given tacrolim and clotrimazole and fluocinide emollient  Some dissolvable tabs and also mouthwash   Also stress- her husband has lung cancer and starting treatment   Patient Active Problem List   Diagnosis Date Noted  . Lichen planus 12/20/2013  . Encounter for Medicare annual wellness exam 06/17/2013  . Joint pain 04/30/2013  . Aphthous ulcer of mouth 03/16/2013  . Tick bite 01/25/2013  . Hyperglycemia 06/03/2011  . Leg pain 05/07/2011  . HYPERTENSION, BENIGN ESSENTIAL 10/23/2010  . CONSTIPATION 10/23/2010  . HEMATOCHEZIA 10/23/2010  . OSTEOARTHRITIS, HANDS, BILATERAL 01/24/2010  . HYPOTHYROIDISM 08/24/2008  . UNSPECIFIED VITAMIN D DEFICIENCY 06/06/2008  . HYPERLIPIDEMIA 06/06/2008  . MENOPAUSAL SYNDROME 03/29/2008  . OSTEOPENIA 03/29/2008  . ALLERGIC RHINITIS 01/25/2008  . IBS 01/25/2008  . OSTEOARTHRITIS 01/25/2008  . FIBROMYALGIA 01/25/2008   Past Medical History  Diagnosis Date  . Osteoarthritis   . Fibromyalgia   . Hypertension   . Hypothyroidism   . Hyperlipidemia   . Internal hemorrhoids   . Colon polyp 1999    small polyp  . Diverticulosis   . Lung nodule     stable LUL 9 mm  . Cataract     Bil/lens implant  . Allergic rhinitis   . Leg cramps   . Menopausal syndrome   . UTI (urinary tract infection)    Past Surgical History  Procedure Laterality Date  . Abdominal hysterectomy  1975   total-fibroids  . Rotator cuff repair      x 2 /right shoulder  . Skin cancer excision      pre-melanoma / on face  . Breast biopsy      benign  . Cataract extraction      bilateral  . Abd u/s  11/1998    negative  . Abd u/s  01/2001    gallbladder polyps  . Hida scan  01/2001    Negative  . Colonoscopy  1998    Diverticulosis; polyp\  . Dexa  10/2001    osteopenia  . Esophagogastroduodenoscopy  2004  . Colonoscopy  09/2002    Diverticulosis, hem  . Colonoscopy  12/2007    diverticulosis, polyp  . Appendectomy  1975  . Cholecystectomy    . Knee surgery      right knee / 11/2004   History  Substance Use Topics  . Smoking status: Never Smoker   . Smokeless tobacco: Never Used  . Alcohol Use: No     Comment: occasional   Family History  Problem Relation Age of Onset  . Parkinsonism Father   . Colon cancer Brother   . Kidney failure Son     congenital  . Allergies Mother   . Heart disease Mother    Allergies  Allergen Reactions  . Amlodipine Besylate  REACTION: muscle spasms, extremity swelling, low bp  . Amoxicillin   . Ciprofloxacin     REACTION: muscle spasms, insomnia  . Codeine     REACTION: hallucinations  . Penicillins   . Propoxyphene Hcl   . Sulfamethoxazole-Trimethoprim     REACTION: dizzy, could not focus eyes and could not concentrate and nausea  . Tetracycline     REACTION: heart palpitations,muscle spasms   Current Outpatient Prescriptions on File Prior to Visit  Medication Sig Dispense Refill  . albuterol (PROVENTIL HFA;VENTOLIN HFA) 108 (90 BASE) MCG/ACT inhaler Inhale 2 puffs into the lungs every 4 (four) hours as needed for wheezing.  1 Inhaler  3  . Ascorbic Acid (VITAMIN C) 1000 MG tablet Take 1,000 mg by mouth daily.        . B Complex Vitamins (VITAMIN B COMPLEX PO) Take 1 tablet by mouth daily.      . Biotin 10 MG TABS Take by mouth. daily       . Calcium Carbonate-Vit D-Min 600-400 MG-UNIT TABS Take by mouth. daily       .  cetirizine (ZYRTEC) 10 MG tablet Take 10 mg by mouth daily as needed for allergies.      . chlorhexidine (PERIDEX) 0.12 % solution RINSE TWICE DAILY AFTER BRUSHING USING A CAPFUL AS MARKED      . Cholecalciferol (VITAMIN D) 2000 UNITS CAPS Take 1 capsule by mouth 2 (two) times daily.        . Cranberry 500 MG CAPS Take by mouth. daily      . cyclobenzaprine (FLEXERIL) 10 MG tablet TAKE ONE TABLET BY MOUTH AT BEDTIME AS NEEDED   30 tablet  4  . estradiol (ESTRACE) 1 MG tablet Take 1 tablet (1 mg total) by mouth daily.  90 tablet  3  . Flax OIL Take by mouth. daily       . ibuprofen (ADVIL,MOTRIN) 200 MG tablet Take 400 mg by mouth 2 (two) times daily as needed.        Marland Kitchen levothyroxine (SYNTHROID, LEVOTHROID) 88 MCG tablet Take 1 tablet (88 mcg total) by mouth daily before breakfast.  90 tablet  3  . lisinopril (PRINIVIL,ZESTRIL) 10 MG tablet Take 1 tablet (10 mg total) by mouth daily.  90 tablet  3  . loratadine-pseudoephedrine (CLARITIN-D 24-HOUR) 10-240 MG per 24 hr tablet Take 1 tablet by mouth daily.      . Lutein 20 MG CAPS Take by mouth. daily       . montelukast (SINGULAIR) 10 MG tablet Take 1 tablet (10 mg total) by mouth at bedtime.  90 tablet  3  . Multiple Vitamin (MULTIVITAMIN) capsule Take 1 capsule by mouth daily.        . mupirocin ointment (BACTROBAN) 2 % Place 1 application into the nose 2 (two) times daily.  22 g  0  . triamcinolone (KENALOG) 0.1 % paste Place 1 application onto teeth 2 (two) times daily.  5 g  2  . triamcinolone (NASACORT) 55 MCG/ACT nasal inhaler Place 2 sprays into the nose as needed.       . vitamin B-12 (CYANOCOBALAMIN) 500 MCG tablet Take 500 mcg by mouth daily.       No current facility-administered medications on file prior to visit.     Review of Systems    Review of Systems  Constitutional: Negative for fever, appetite change, and unexpected weight change.  Eyes: Negative for pain and visual disturbance.  ENT pos for congestion and rhinorrhea /  pos for some sinus pain  Respiratory: Negative for  shortness of breath.   Cardiovascular: Negative for cp or palpitations    Gastrointestinal: Negative for nausea, diarrhea and constipation.  Genitourinary: Negative for urgency and frequency.  Skin: Negative for pallor or rash   Neurological: Negative for weakness, light-headedness, numbness and headaches.  Hematological: Negative for adenopathy. Does not bruise/bleed easily.  Psychiatric/Behavioral: Negative for dysphoric mood. The patient is not nervous/anxious.  pos for stressors     Objective:   Physical Exam  Constitutional: She appears well-developed and well-nourished. No distress.  obese and well appearing   HENT:  Head: Normocephalic and atraumatic.  Right Ear: External ear normal.  Left Ear: External ear normal.  Mouth/Throat: Oropharynx is clear and moist. No oropharyngeal exudate.  Nares are injected and congested  Bilateral maxillary sinus tenderness   Eyes: Conjunctivae and EOM are normal. Pupils are equal, round, and reactive to light. Right eye exhibits no discharge. Left eye exhibits no discharge. No scleral icterus.  Neck: Normal range of motion. Neck supple.  Cardiovascular: Normal rate and regular rhythm.   Pulmonary/Chest: Effort normal and breath sounds normal. No respiratory distress. She has no wheezes. She has no rales.  Harsh sounding bronchial cough   Lymphadenopathy:    She has no cervical adenopathy.  Neurological: She is alert.  Skin: Skin is warm and dry. No rash noted. No erythema. No pallor.  Psychiatric: She has a normal mood and affect.          Assessment & Plan:

## 2013-12-20 NOTE — Patient Instructions (Addendum)
Hold lisinopril for a week - it may contribute to cough - if cough worsens when we re start it -let me know and we will change that  Robitussin DM is ok  Take zithromax as directed Make sure to drink lots of fluids Update if not starting to improve in a week or if worsening

## 2013-12-20 NOTE — Assessment & Plan Note (Signed)
With sinus congestion and pain and persistent cough  Cover with zithromax  Disc symptomatic care - see instructions on AVS Update if not starting to improve in a week or if worsening

## 2013-12-20 NOTE — Progress Notes (Signed)
Pre visit review using our clinic review tool, if applicable. No additional management support is needed unless otherwise documented below in the visit note. 

## 2013-12-28 DIAGNOSIS — H4011X0 Primary open-angle glaucoma, stage unspecified: Secondary | ICD-10-CM | POA: Diagnosis not present

## 2013-12-28 DIAGNOSIS — B370 Candidal stomatitis: Secondary | ICD-10-CM | POA: Diagnosis not present

## 2013-12-28 DIAGNOSIS — L439 Lichen planus, unspecified: Secondary | ICD-10-CM | POA: Diagnosis not present

## 2013-12-28 DIAGNOSIS — Z79899 Other long term (current) drug therapy: Secondary | ICD-10-CM | POA: Diagnosis not present

## 2013-12-28 DIAGNOSIS — L0100 Impetigo, unspecified: Secondary | ICD-10-CM | POA: Diagnosis not present

## 2013-12-28 DIAGNOSIS — H409 Unspecified glaucoma: Secondary | ICD-10-CM | POA: Diagnosis not present

## 2014-01-06 DIAGNOSIS — H33309 Unspecified retinal break, unspecified eye: Secondary | ICD-10-CM | POA: Diagnosis not present

## 2014-01-13 DIAGNOSIS — H33309 Unspecified retinal break, unspecified eye: Secondary | ICD-10-CM | POA: Diagnosis not present

## 2014-01-25 DIAGNOSIS — B370 Candidal stomatitis: Secondary | ICD-10-CM | POA: Diagnosis not present

## 2014-01-25 DIAGNOSIS — L439 Lichen planus, unspecified: Secondary | ICD-10-CM | POA: Diagnosis not present

## 2014-01-25 DIAGNOSIS — Z79899 Other long term (current) drug therapy: Secondary | ICD-10-CM | POA: Diagnosis not present

## 2014-02-08 DIAGNOSIS — H4011X0 Primary open-angle glaucoma, stage unspecified: Secondary | ICD-10-CM | POA: Diagnosis not present

## 2014-02-08 DIAGNOSIS — H409 Unspecified glaucoma: Secondary | ICD-10-CM | POA: Diagnosis not present

## 2014-03-07 DIAGNOSIS — M999 Biomechanical lesion, unspecified: Secondary | ICD-10-CM | POA: Diagnosis not present

## 2014-03-07 DIAGNOSIS — M9981 Other biomechanical lesions of cervical region: Secondary | ICD-10-CM | POA: Diagnosis not present

## 2014-03-07 DIAGNOSIS — M542 Cervicalgia: Secondary | ICD-10-CM | POA: Diagnosis not present

## 2014-03-07 DIAGNOSIS — M545 Low back pain, unspecified: Secondary | ICD-10-CM | POA: Diagnosis not present

## 2014-03-10 DIAGNOSIS — M545 Low back pain, unspecified: Secondary | ICD-10-CM | POA: Diagnosis not present

## 2014-03-10 DIAGNOSIS — M9981 Other biomechanical lesions of cervical region: Secondary | ICD-10-CM | POA: Diagnosis not present

## 2014-03-10 DIAGNOSIS — M999 Biomechanical lesion, unspecified: Secondary | ICD-10-CM | POA: Diagnosis not present

## 2014-03-10 DIAGNOSIS — M542 Cervicalgia: Secondary | ICD-10-CM | POA: Diagnosis not present

## 2014-03-11 DIAGNOSIS — M999 Biomechanical lesion, unspecified: Secondary | ICD-10-CM | POA: Diagnosis not present

## 2014-03-11 DIAGNOSIS — M9981 Other biomechanical lesions of cervical region: Secondary | ICD-10-CM | POA: Diagnosis not present

## 2014-03-11 DIAGNOSIS — M545 Low back pain, unspecified: Secondary | ICD-10-CM | POA: Diagnosis not present

## 2014-03-11 DIAGNOSIS — M542 Cervicalgia: Secondary | ICD-10-CM | POA: Diagnosis not present

## 2014-03-22 DIAGNOSIS — L439 Lichen planus, unspecified: Secondary | ICD-10-CM | POA: Diagnosis not present

## 2014-03-22 DIAGNOSIS — B370 Candidal stomatitis: Secondary | ICD-10-CM | POA: Diagnosis not present

## 2014-03-22 DIAGNOSIS — Z79899 Other long term (current) drug therapy: Secondary | ICD-10-CM | POA: Diagnosis not present

## 2014-03-28 DIAGNOSIS — Z79899 Other long term (current) drug therapy: Secondary | ICD-10-CM | POA: Diagnosis not present

## 2014-04-07 ENCOUNTER — Other Ambulatory Visit: Payer: Self-pay | Admitting: Family Medicine

## 2014-04-07 NOTE — Telephone Encounter (Signed)
Will refill electronically  

## 2014-04-07 NOTE — Telephone Encounter (Signed)
Electronic refill request, please advise  

## 2014-04-22 ENCOUNTER — Encounter: Payer: Self-pay | Admitting: Internal Medicine

## 2014-05-04 DIAGNOSIS — L439 Lichen planus, unspecified: Secondary | ICD-10-CM | POA: Diagnosis not present

## 2014-05-04 DIAGNOSIS — Z79899 Other long term (current) drug therapy: Secondary | ICD-10-CM | POA: Diagnosis not present

## 2014-05-04 DIAGNOSIS — L0100 Impetigo, unspecified: Secondary | ICD-10-CM | POA: Diagnosis not present

## 2014-05-04 DIAGNOSIS — B370 Candidal stomatitis: Secondary | ICD-10-CM | POA: Diagnosis not present

## 2014-05-12 DIAGNOSIS — Z23 Encounter for immunization: Secondary | ICD-10-CM | POA: Diagnosis not present

## 2014-05-16 ENCOUNTER — Encounter: Payer: Self-pay | Admitting: Family Medicine

## 2014-05-16 ENCOUNTER — Ambulatory Visit (INDEPENDENT_AMBULATORY_CARE_PROVIDER_SITE_OTHER): Payer: Medicare Other | Admitting: Family Medicine

## 2014-05-16 VITALS — BP 134/92 | HR 76 | Temp 99.2°F | Ht 63.0 in | Wt 201.5 lb

## 2014-05-16 DIAGNOSIS — J209 Acute bronchitis, unspecified: Secondary | ICD-10-CM

## 2014-05-16 MED ORDER — AZITHROMYCIN 250 MG PO TABS: ORAL_TABLET | ORAL | Status: DC

## 2014-05-16 MED ORDER — ALBUTEROL SULFATE HFA 108 (90 BASE) MCG/ACT IN AERS: 2.0000 | INHALATION_SPRAY | RESPIRATORY_TRACT | Status: DC | PRN

## 2014-05-16 MED ORDER — PREDNISONE 10 MG PO TABS: ORAL_TABLET | ORAL | Status: DC

## 2014-05-16 NOTE — Assessment & Plan Note (Signed)
Cover with zpak and prednisone taper Refilled albuterol MDI Fluids/rest Disc symptomatic care - see instructions on AVS - continue mucinex Update if not starting to improve in a week or if worsening

## 2014-05-16 NOTE — Progress Notes (Signed)
Subjective:    Patient ID: Linda Mcgrath, female    DOB: [birth year], 80 y.o.   MRN: [id number]  HPI Here with uri   All weekend - purulent nasal d/c and phlegm Prod cough  Very congested  Hoarse voice / st Wheezing fairly as well - using her albuterol inhaler   No fever  Has felt achey   Taking claritin -not a lot of help    Patient Active Problem List   Diagnosis Date Noted  . Lichen planus 12/20/2013  . Cough 12/20/2013  . Encounter for Medicare annual wellness exam 06/17/2013  . Joint pain 04/30/2013  . Aphthous ulcer of mouth 03/16/2013  . Tick bite 01/25/2013  . Hyperglycemia 06/03/2011  . Leg pain 05/07/2011  . HYPERTENSION, BENIGN ESSENTIAL 10/23/2010  . CONSTIPATION 10/23/2010  . HEMATOCHEZIA 10/23/2010  . OSTEOARTHRITIS, HANDS, BILATERAL 01/24/2010  . HYPOTHYROIDISM 08/24/2008  . UNSPECIFIED VITAMIN D DEFICIENCY 06/06/2008  . HYPERLIPIDEMIA 06/06/2008  . MENOPAUSAL SYNDROME 03/29/2008  . OSTEOPENIA 03/29/2008  . ALLERGIC RHINITIS 01/25/2008  . IBS 01/25/2008  . OSTEOARTHRITIS 01/25/2008  . FIBROMYALGIA 01/25/2008   Past Medical History  Diagnosis Date  . Osteoarthritis   . Fibromyalgia   . Hypertension   . Hypothyroidism   . Hyperlipidemia   . Internal hemorrhoids   . Colon polyp 1999    small polyp  . Diverticulosis   . Lung nodule     stable LUL 9 mm  . Cataract     Bil/lens implant  . Allergic rhinitis   . Leg cramps   . Menopausal syndrome   . UTI (urinary tract infection)    Past Surgical History  Procedure Laterality Date  . Abdominal hysterectomy  1975    total-fibroids  . Rotator cuff repair      x 2 /right shoulder  . Skin cancer excision      pre-melanoma / on face  . Breast biopsy      benign  . Cataract extraction      bilateral  . Abd u/s  11/1998    negative  . Abd u/s  01/2001    gallbladder polyps  . Hida scan  01/2001    Negative  . Colonoscopy  1998    Diverticulosis; polyp\  . Dexa  10/2001   osteopenia  . Esophagogastroduodenoscopy  2004  . Colonoscopy  09/2002    Diverticulosis, hem  . Colonoscopy  12/2007    diverticulosis, polyp  . Appendectomy  1975  . Cholecystectomy    . Knee surgery      right knee / 11/2004   History  Substance Use Topics  . Smoking status: Never Smoker   . Smokeless tobacco: Never Used  . Alcohol Use: No     Comment: occasional   Family History  Problem Relation Age of Onset  . Parkinsonism Father   . Colon cancer Brother   . Kidney failure Son     congenital  . Allergies Mother   . Heart disease Mother    Allergies  Allergen Reactions  . Amlodipine Besylate     REACTION: muscle spasms, extremity swelling, low bp  . Amoxicillin   . Ciprofloxacin     REACTION: muscle spasms, insomnia  . Codeine     REACTION: hallucinations  . Penicillins   . Propoxyphene Hcl   . Sulfamethoxazole-Trimethoprim     REACTION: dizzy, could not focus eyes and could not concentrate and nausea  . Tetracycline     REACTION: heart palpitations,muscle  spasms   Current Outpatient Prescriptions on File Prior to Visit  Medication Sig Dispense Refill  . albuterol (PROVENTIL HFA;VENTOLIN HFA) 108 (90 BASE) MCG/ACT inhaler Inhale 2 puffs into the lungs every 4 (four) hours as needed for wheezing.  1 Inhaler  3  . Ascorbic Acid (VITAMIN C) 1000 MG tablet Take 1,000 mg by mouth daily.        . B Complex Vitamins (VITAMIN B COMPLEX PO) Take 1 tablet by mouth daily.      . Biotin 10 MG TABS Take by mouth. daily       . Calcium Carbonate-Vit D-Min 600-400 MG-UNIT TABS Take by mouth. daily       . cetirizine (ZYRTEC) 10 MG tablet Take 10 mg by mouth daily as needed for allergies.      . chlorhexidine (PERIDEX) 0.12 % solution RINSE TWICE DAILY AFTER BRUSHING USING A CAPFUL AS MARKED      . Cholecalciferol (VITAMIN D) 2000 UNITS CAPS Take 1 capsule by mouth 2 (two) times daily.        . clotrimazole (MYCELEX) 10 MG troche Take 10 mg by mouth daily.      . Cranberry  500 MG CAPS Take by mouth. daily      . cyclobenzaprine (FLEXERIL) 10 MG tablet TAKE ONE TABLET BY MOUTH AT BEDTIME AS NEEDED   30 tablet  3  . estradiol (ESTRACE) 1 MG tablet Take 1 tablet (1 mg total) by mouth daily.  90 tablet  3  . fluocinonide-emollient (LIDEX-E) 0.05 % cream Apply to itching areas and body twice daily as needed      . ibuprofen (ADVIL,MOTRIN) 200 MG tablet Take 400 mg by mouth 2 (two) times daily as needed.        Marland Kitchen levothyroxine (SYNTHROID, LEVOTHROID) 88 MCG tablet Take 1 tablet (88 mcg total) by mouth daily before breakfast.  90 tablet  3  . loratadine-pseudoephedrine (CLARITIN-D 24-HOUR) 10-240 MG per 24 hr tablet Take 1 tablet by mouth daily.      . Lutein 20 MG CAPS Take by mouth. daily       . montelukast (SINGULAIR) 10 MG tablet Take 1 tablet (10 mg total) by mouth at bedtime.  90 tablet  3  . Multiple Vitamin (MULTIVITAMIN) capsule Take 1 capsule by mouth daily.        . tacrolimus (PROGRAF) 1 MG capsule 1 mg. Dissolve 1 cap in 1/2 liter of water, swish and spit twice daily      . triamcinolone (NASACORT) 55 MCG/ACT nasal inhaler Place 2 sprays into the nose as needed.       . vitamin B-12 (CYANOCOBALAMIN) 500 MCG tablet Take 500 mcg by mouth daily.       No current facility-administered medications on file prior to visit.    Review of Systems Review of Systems  Constitutional: Negative for fever, appetite change,  and unexpected weight change. pos for fatigue and malaise  ENT pos for cong and rhinorrhea and sinus pressure and st Eyes: Negative for pain and visual disturbance.  Respiratory: Negative for  shortness of breath.   Cardiovascular: Negative for cp or palpitations    Gastrointestinal: Negative for nausea, diarrhea and constipation.  Genitourinary: Negative for urgency and frequency.  Skin: Negative for pallor or rash   Neurological: Negative for weakness, light-headedness, numbness and headaches.  Hematological: Negative for adenopathy. Does not  bruise/bleed easily.  Psychiatric/Behavioral: Negative for dysphoric mood. The patient is not nervous/anxious.  Objective:   Physical Exam  Constitutional: She appears well-developed and well-nourished. No distress.  obese and well appearing   HENT:  Head: Normocephalic and atraumatic.  Right Ear: External ear normal.  Left Ear: External ear normal.  Mouth/Throat: Oropharynx is clear and moist. No oropharyngeal exudate.  Nares are injected and congested  No sinus tenderness Clear rhinorrhea    Eyes: Conjunctivae and EOM are normal. Pupils are equal, round, and reactive to light. Right eye exhibits no discharge. Left eye exhibits no discharge.  Neck: Normal range of motion. Neck supple.  Cardiovascular: Normal rate, regular rhythm and normal heart sounds.   Pulmonary/Chest: Effort normal. No respiratory distress. She has wheezes. She has no rales. She exhibits no tenderness.  Harsh bs with scattered rhonchi Wheeze on forced exp No prolonged exp phase  Fair air exch  No rales   Musculoskeletal: She exhibits no edema.  Lymphadenopathy:    She has no cervical adenopathy.  Neurological: She is alert.  Skin: Skin is warm and dry. No rash noted. No erythema.  Psychiatric: She has a normal mood and affect.          Assessment & Plan:   Problem List Items Addressed This Visit     Respiratory   Acute bronchitis with bronchospasm - Primary     Cover with zpak and prednisone taper Refilled albuterol MDI Fluids/rest Disc symptomatic care - see instructions on AVS - continue mucinex Update if not starting to improve in a week or if worsening

## 2014-05-16 NOTE — Progress Notes (Signed)
Pre visit review using our clinic review tool, if applicable. No additional management support is needed unless otherwise documented below in the visit note. 

## 2014-05-16 NOTE — Patient Instructions (Signed)
I think you have bronchitis with wheezing Take zpack as directed  Fluids and rest  Continue mucinex or mucinex DM Take the prednisone as directed  Use inhaler as needed  Update if not starting to improve in a week or if worsening

## 2014-05-19 DIAGNOSIS — H43819 Vitreous degeneration, unspecified eye: Secondary | ICD-10-CM | POA: Diagnosis not present

## 2014-05-19 DIAGNOSIS — H33309 Unspecified retinal break, unspecified eye: Secondary | ICD-10-CM | POA: Diagnosis not present

## 2014-05-19 DIAGNOSIS — H43399 Other vitreous opacities, unspecified eye: Secondary | ICD-10-CM | POA: Diagnosis not present

## 2014-05-24 ENCOUNTER — Ambulatory Visit (INDEPENDENT_AMBULATORY_CARE_PROVIDER_SITE_OTHER): Payer: Medicare Other | Admitting: Family Medicine

## 2014-05-24 ENCOUNTER — Encounter: Payer: Self-pay | Admitting: Family Medicine

## 2014-05-24 VITALS — BP 142/80 | HR 81 | Temp 98.4°F | Ht 63.0 in | Wt 204.0 lb

## 2014-05-24 DIAGNOSIS — J209 Acute bronchitis, unspecified: Secondary | ICD-10-CM | POA: Diagnosis not present

## 2014-05-24 MED ORDER — DOXYCYCLINE HYCLATE 100 MG PO TABS: 100.0000 mg | ORAL_TABLET | Freq: Two times a day (BID) | ORAL | Status: DC

## 2014-05-24 NOTE — Progress Notes (Signed)
Subjective:    Patient ID: Linda Mcgrath, female    DOB: December 11, 1932, 80 y.o.   MRN: [id number]  HPI Here for f/u of bronchitis  Still coughing up a lot of green phlegm - is a little better  Wheezing - about the same   Temp may be a bit over nl for her  Fatigue   Headache from coughing  Feels like her sinuses are full    On zpak Prednisone  Albuterol -helpful   Patient Active Problem List   Diagnosis Date Noted  . Acute bronchitis with bronchospasm 05/16/2014  . Lichen planus 12/20/2013  . Cough 12/20/2013  . Encounter for Medicare annual wellness exam 06/17/2013  . Joint pain 04/30/2013  . Aphthous ulcer of mouth 03/16/2013  . Tick bite 01/25/2013  . Hyperglycemia 06/03/2011  . Leg pain 05/07/2011  . HYPERTENSION, BENIGN ESSENTIAL 10/23/2010  . CONSTIPATION 10/23/2010  . HEMATOCHEZIA 10/23/2010  . OSTEOARTHRITIS, HANDS, BILATERAL 01/24/2010  . HYPOTHYROIDISM 08/24/2008  . UNSPECIFIED VITAMIN D DEFICIENCY 06/06/2008  . HYPERLIPIDEMIA 06/06/2008  . MENOPAUSAL SYNDROME 03/29/2008  . OSTEOPENIA 03/29/2008  . ALLERGIC RHINITIS 01/25/2008  . IBS 01/25/2008  . OSTEOARTHRITIS 01/25/2008  . FIBROMYALGIA 01/25/2008   Past Medical History  Diagnosis Date  . Osteoarthritis   . Fibromyalgia   . Hypertension   . Hypothyroidism   . Hyperlipidemia   . Internal hemorrhoids   . Colon polyp 1999    small polyp  . Diverticulosis   . Lung nodule     stable LUL 9 mm  . Cataract     Bil/lens implant  . Allergic rhinitis   . Leg cramps   . Menopausal syndrome   . UTI (urinary tract infection)    Past Surgical History  Procedure Laterality Date  . Abdominal hysterectomy  1975    total-fibroids  . Rotator cuff repair      x 2 /right shoulder  . Skin cancer excision      pre-melanoma / on face  . Breast biopsy      benign  . Cataract extraction      bilateral  . Abd u/s  11/1998    negative  . Abd u/s  01/2001    gallbladder polyps  . Hida scan  01/2001      Negative  . Colonoscopy  1998    Diverticulosis; polyp\  . Dexa  10/2001    osteopenia  . Esophagogastroduodenoscopy  2004  . Colonoscopy  09/2002    Diverticulosis, hem  . Colonoscopy  12/2007    diverticulosis, polyp  . Appendectomy  1975  . Cholecystectomy    . Knee surgery      right knee / 11/2004   History  Substance Use Topics  . Smoking status: Never Smoker   . Smokeless tobacco: Never Used  . Alcohol Use: No     Comment: occasional   Family History  Problem Relation Age of Onset  . Parkinsonism Father   . Colon cancer Brother   . Kidney failure Son     congenital  . Allergies Mother   . Heart disease Mother    Allergies  Allergen Reactions  . Amlodipine Besylate     REACTION: muscle spasms, extremity swelling, low bp  . Amoxicillin   . Ciprofloxacin     REACTION: muscle spasms, insomnia  . Codeine     REACTION: hallucinations  . Penicillins   . Propoxyphene Hcl   . Sulfamethoxazole-Trimethoprim     REACTION: dizzy, could  not focus eyes and could not concentrate and nausea  . Tetracycline     REACTION: heart palpitations,muscle spasms   Current Outpatient Prescriptions on File Prior to Visit  Medication Sig Dispense Refill  . albuterol (PROVENTIL HFA;VENTOLIN HFA) 108 (90 BASE) MCG/ACT inhaler Inhale 2 puffs into the lungs every 4 (four) hours as needed for wheezing.  1 Inhaler  5  . Ascorbic Acid (VITAMIN C) 1000 MG tablet Take 1,000 mg by mouth daily.        . B Complex Vitamins (VITAMIN B COMPLEX PO) Take 1 tablet by mouth daily.      . Biotin 10 MG TABS Take by mouth. daily       . brinzolamide (AZOPT) 1 % ophthalmic suspension 1 drop 2 (two) times daily.      . Calcium Carbonate-Vit D-Min 600-400 MG-UNIT TABS Take by mouth. daily       . cetirizine (ZYRTEC) 10 MG tablet Take 10 mg by mouth daily as needed for allergies.      . chlorhexidine (PERIDEX) 0.12 % solution RINSE TWICE DAILY AFTER BRUSHING USING A CAPFUL AS MARKED      . Cholecalciferol  (VITAMIN D) 2000 UNITS CAPS Take 1 capsule by mouth 2 (two) times daily.        . clotrimazole (MYCELEX) 10 MG troche Take 10 mg by mouth daily.      . Cranberry 500 MG CAPS Take by mouth. daily      . cyclobenzaprine (FLEXERIL) 10 MG tablet TAKE ONE TABLET BY MOUTH AT BEDTIME AS NEEDED   30 tablet  3  . estradiol (ESTRACE) 1 MG tablet Take 1 tablet (1 mg total) by mouth daily.  90 tablet  3  . fluocinonide-emollient (LIDEX-E) 0.05 % cream Apply to itching areas and body twice daily as needed      . ibuprofen (ADVIL,MOTRIN) 200 MG tablet Take 400 mg by mouth 2 (two) times daily as needed.        Marland Kitchen levothyroxine (SYNTHROID, LEVOTHROID) 88 MCG tablet Take 1 tablet (88 mcg total) by mouth daily before breakfast.  90 tablet  3  . loratadine-pseudoephedrine (CLARITIN-D 24-HOUR) 10-240 MG per 24 hr tablet Take 1 tablet by mouth daily.      . Lutein 20 MG CAPS Take by mouth. daily       . montelukast (SINGULAIR) 10 MG tablet Take 1 tablet (10 mg total) by mouth at bedtime.  90 tablet  3  . Multiple Vitamin (MULTIVITAMIN) capsule Take 1 capsule by mouth daily.        . tacrolimus (PROGRAF) 1 MG capsule 1 mg. Dissolve 1 cap in 1/2 liter of water, swish and spit twice daily      . triamcinolone (NASACORT) 55 MCG/ACT nasal inhaler Place 2 sprays into the nose as needed.       . vitamin B-12 (CYANOCOBALAMIN) 500 MCG tablet Take 500 mcg by mouth daily.       No current facility-administered medications on file prior to visit.      Review of Systems Review of Systems  Constitutional: Negative for fever, appetite change,  and unexpected weight change.  ENT pos for nasal congestion and st and drip/ neg for sinus pain  Eyes: Negative for pain and visual disturbance.  Respiratory: Negative for shortness of breath.  pos for cough , pos for wheeze that is improved  Cardiovascular: Negative for cp or palpitations    Gastrointestinal: Negative for nausea, diarrhea and constipation.  Genitourinary: Negative for  urgency and frequency.  Skin: Negative for pallor or rash   Neurological: Negative for weakness, light-headedness, numbness and headaches.  Hematological: Negative for adenopathy. Does not bruise/bleed easily.  Psychiatric/Behavioral: Negative for dysphoric mood. The patient is not nervous/anxious.         Objective:   Physical Exam  Constitutional: She appears well-developed and well-nourished. No distress.  obese and well appearing   HENT:  Head: Normocephalic and atraumatic.  Right Ear: External ear normal.  Left Ear: External ear normal.  Mouth/Throat: Oropharynx is clear and moist.  Eyes: Conjunctivae and EOM are normal. Pupils are equal, round, and reactive to light. Right eye exhibits no discharge. Left eye exhibits no discharge.  Neck: Normal range of motion. Neck supple.  Cardiovascular: Normal rate, regular rhythm and normal heart sounds.   Pulmonary/Chest: Effort normal. No respiratory distress. She has wheezes. She has no rales. She exhibits no tenderness.  Scant wheeze at bases on forced exp only  Good air exch No prolonged exp phase No rales or rhonchi  Lymphadenopathy:    She has no cervical adenopathy.  Neurological: She is alert.  Skin: Skin is warm and dry. No rash noted.  Psychiatric: She has a normal mood and affect.          Assessment & Plan:   Problem List Items Addressed This Visit     Respiratory   Acute bronchitis with bronchospasm - Primary     Still feels lousy but improved exam with good air exch  Multiple med allergies-tx with doxycycline Disc symptomatic care - see instructions on AVS  Update if not starting to improve in a week or if worsening

## 2014-05-24 NOTE — Progress Notes (Signed)
Pre visit review using our clinic review tool, if applicable. No additional management support is needed unless otherwise documented below in the visit note. 

## 2014-05-24 NOTE — Patient Instructions (Signed)
Lungs sound a lot better today  Drink fluids Take doxycycline and update if any problems   ( will see if this helps sinuses a bit more) Continue inhaler Update if not starting to improve in a week or if worsening

## 2014-05-26 NOTE — Assessment & Plan Note (Signed)
Still feels lousy but improved exam with good air exch  Multiple med allergies-tx with doxycycline Disc symptomatic care - see instructions on AVS  Update if not starting to improve in a week or if worsening

## 2014-06-17 ENCOUNTER — Other Ambulatory Visit: Payer: Self-pay | Admitting: Family Medicine

## 2014-06-19 ENCOUNTER — Other Ambulatory Visit: Payer: Self-pay | Admitting: Family Medicine

## 2014-06-20 NOTE — Telephone Encounter (Signed)
Pt has labs and cpx scheduled in 3/16.

## 2014-06-23 ENCOUNTER — Other Ambulatory Visit: Payer: Self-pay | Admitting: Family Medicine

## 2014-06-28 ENCOUNTER — Other Ambulatory Visit: Payer: Self-pay | Admitting: Family Medicine

## 2014-08-02 DIAGNOSIS — L438 Other lichen planus: Secondary | ICD-10-CM | POA: Diagnosis not present

## 2014-08-02 DIAGNOSIS — B370 Candidal stomatitis: Secondary | ICD-10-CM | POA: Diagnosis not present

## 2014-08-02 DIAGNOSIS — Z79899 Other long term (current) drug therapy: Secondary | ICD-10-CM | POA: Diagnosis not present

## 2014-08-09 DIAGNOSIS — H4011X1 Primary open-angle glaucoma, mild stage: Secondary | ICD-10-CM | POA: Diagnosis not present

## 2014-08-11 ENCOUNTER — Other Ambulatory Visit: Payer: Self-pay | Admitting: Family Medicine

## 2014-08-11 NOTE — Telephone Encounter (Signed)
Will refill electronically  

## 2014-08-11 NOTE — Telephone Encounter (Signed)
Electronic refill request, please advise  

## 2014-09-14 DIAGNOSIS — H4011X1 Primary open-angle glaucoma, mild stage: Secondary | ICD-10-CM | POA: Diagnosis not present

## 2014-09-21 DIAGNOSIS — H4011X1 Primary open-angle glaucoma, mild stage: Secondary | ICD-10-CM | POA: Diagnosis not present

## 2014-09-21 HISTORY — PX: OTHER SURGICAL HISTORY: SHX169

## 2014-10-17 ENCOUNTER — Telehealth: Payer: Self-pay | Admitting: Family Medicine

## 2014-10-17 DIAGNOSIS — E559 Vitamin D deficiency, unspecified: Secondary | ICD-10-CM

## 2014-10-17 DIAGNOSIS — I10 Essential (primary) hypertension: Secondary | ICD-10-CM

## 2014-10-17 DIAGNOSIS — R739 Hyperglycemia, unspecified: Secondary | ICD-10-CM

## 2014-10-17 DIAGNOSIS — E039 Hypothyroidism, unspecified: Secondary | ICD-10-CM

## 2014-10-17 DIAGNOSIS — M8580 Other specified disorders of bone density and structure, unspecified site: Secondary | ICD-10-CM

## 2014-10-17 DIAGNOSIS — E785 Hyperlipidemia, unspecified: Secondary | ICD-10-CM

## 2014-10-17 NOTE — Telephone Encounter (Signed)
-----   Message from Ellamae Sia sent at 10/12/2014  5:26 PM EST ----- Regarding: Lab orders for Tuesday,2.23.16 Patient is scheduled for CPX labs, please order future labs, Thanks , Karna Christmas

## 2014-10-18 ENCOUNTER — Other Ambulatory Visit (INDEPENDENT_AMBULATORY_CARE_PROVIDER_SITE_OTHER): Payer: Medicare Other

## 2014-10-18 DIAGNOSIS — E559 Vitamin D deficiency, unspecified: Secondary | ICD-10-CM | POA: Diagnosis not present

## 2014-10-18 DIAGNOSIS — E039 Hypothyroidism, unspecified: Secondary | ICD-10-CM | POA: Diagnosis not present

## 2014-10-18 DIAGNOSIS — R739 Hyperglycemia, unspecified: Secondary | ICD-10-CM | POA: Diagnosis not present

## 2014-10-18 DIAGNOSIS — E785 Hyperlipidemia, unspecified: Secondary | ICD-10-CM

## 2014-10-18 DIAGNOSIS — I10 Essential (primary) hypertension: Secondary | ICD-10-CM | POA: Diagnosis not present

## 2014-10-18 DIAGNOSIS — M8580 Other specified disorders of bone density and structure, unspecified site: Secondary | ICD-10-CM

## 2014-10-18 LAB — LIPID PANEL
Cholesterol: 206 mg/dL — ABNORMAL HIGH (ref 0–200)
HDL: 65 mg/dL (ref >39.00)
LDL Cholesterol: 116 mg/dL — ABNORMAL HIGH (ref 0–99)
NonHDL: 141
Total CHOL/HDL Ratio: 3
Triglycerides: 124 mg/dL (ref 0.0–149.0)
VLDL: 24.8 mg/dL (ref 0.0–40.0)

## 2014-10-18 LAB — COMPREHENSIVE METABOLIC PANEL
ALT: 18 U/L (ref 0–35)
AST: 19 U/L (ref 0–37)
Albumin: 3.9 g/dL (ref 3.5–5.2)
Alkaline Phosphatase: 72 U/L (ref 39–117)
BUN: 20 mg/dL (ref 6–23)
CO2: 28 mEq/L (ref 19–32)
Calcium: 9.4 mg/dL (ref 8.4–10.5)
Chloride: 104 mEq/L (ref 96–112)
Creatinine, Ser: 1.04 mg/dL (ref 0.40–1.20)
GFR: 54.25 mL/min — ABNORMAL LOW (ref >60.00)
Glucose, Bld: 90 mg/dL (ref 70–99)
Potassium: 4.5 mEq/L (ref 3.5–5.1)
Sodium: 137 mEq/L (ref 135–145)
Total Bilirubin: 0.6 mg/dL (ref 0.2–1.2)
Total Protein: 6.7 g/dL (ref 6.0–8.3)

## 2014-10-18 LAB — CBC WITH DIFFERENTIAL/PLATELET
Basophils Absolute: 0.1 10*3/uL (ref 0.0–0.1)
Basophils Relative: 0.8 % (ref 0.0–3.0)
Eosinophils Absolute: 0.7 10*3/uL (ref 0.0–0.7)
Eosinophils Relative: 8.2 % — ABNORMAL HIGH (ref 0.0–5.0)
HCT: 42.5 % (ref 36.0–46.0)
Hemoglobin: 14.3 g/dL (ref 12.0–15.0)
Lymphocytes Relative: 43.6 % (ref 12.0–46.0)
Lymphs Abs: 3.6 10*3/uL (ref 0.7–4.0)
MCHC: 33.8 g/dL (ref 30.0–36.0)
MCV: 97.1 fl (ref 78.0–100.0)
Monocytes Absolute: 0.6 10*3/uL (ref 0.1–1.0)
Monocytes Relative: 7.3 % (ref 3.0–12.0)
Neutro Abs: 3.3 10*3/uL (ref 1.4–7.7)
Neutrophils Relative %: 40.1 % — ABNORMAL LOW (ref 43.0–77.0)
Platelets: 296 10*3/uL (ref 150.0–400.0)
RBC: 4.37 Mil/uL (ref 3.87–5.11)
RDW: 13.9 % (ref 11.5–15.5)
WBC: 8.2 10*3/uL (ref 4.0–10.5)

## 2014-10-18 LAB — HEMOGLOBIN A1C: Hgb A1c MFr Bld: 5.3 % (ref 4.6–6.5)

## 2014-10-18 LAB — TSH: TSH: 5.78 u[IU]/mL — ABNORMAL HIGH (ref 0.35–4.50)

## 2014-10-18 LAB — VITAMIN D 25 HYDROXY (VIT D DEFICIENCY, FRACTURES): VITD: 40.82 ng/mL (ref 30.00–100.00)

## 2014-10-25 ENCOUNTER — Ambulatory Visit (INDEPENDENT_AMBULATORY_CARE_PROVIDER_SITE_OTHER): Payer: Medicare Other | Admitting: Family Medicine

## 2014-10-25 ENCOUNTER — Encounter: Payer: Self-pay | Admitting: Family Medicine

## 2014-10-25 VITALS — BP 130/72 | HR 71 | Temp 98.2°F | Ht 63.0 in | Wt 194.0 lb

## 2014-10-25 DIAGNOSIS — I10 Essential (primary) hypertension: Secondary | ICD-10-CM

## 2014-10-25 DIAGNOSIS — E2839 Other primary ovarian failure: Secondary | ICD-10-CM | POA: Insufficient documentation

## 2014-10-25 DIAGNOSIS — Z23 Encounter for immunization: Secondary | ICD-10-CM

## 2014-10-25 DIAGNOSIS — E559 Vitamin D deficiency, unspecified: Secondary | ICD-10-CM

## 2014-10-25 DIAGNOSIS — E785 Hyperlipidemia, unspecified: Secondary | ICD-10-CM

## 2014-10-25 DIAGNOSIS — E039 Hypothyroidism, unspecified: Secondary | ICD-10-CM | POA: Diagnosis not present

## 2014-10-25 DIAGNOSIS — F4320 Adjustment disorder, unspecified: Secondary | ICD-10-CM

## 2014-10-25 DIAGNOSIS — Z0000 Encounter for general adult medical examination without abnormal findings: Secondary | ICD-10-CM | POA: Diagnosis not present

## 2014-10-25 DIAGNOSIS — M8580 Other specified disorders of bone density and structure, unspecified site: Secondary | ICD-10-CM

## 2014-10-25 DIAGNOSIS — F4321 Adjustment disorder with depressed mood: Secondary | ICD-10-CM | POA: Diagnosis not present

## 2014-10-25 NOTE — Progress Notes (Signed)
Subjective:    Patient ID: Linda Mcgrath, female    DOB: 1933/04/07, 81 y.o.   MRN: [id number]  HPI Here for annual medicare wellness visit as well as chronic/acute medical problems   She lost her husband to lung cancer last mo  Grief- thinks she has good support from family No counseling so far  Also pulled muscles in her back lifting her -some night time pain  Had to do full care with some hospice help     Wt is down 10 lb with bmi of 34 Wants to exercise and loose more   I have personally reviewed the Medicare Annual Wellness questionnaire and have noted 1. The patient's medical and social history 2. Their use of alcohol, tobacco or illicit drugs 3. Their current medications and supplements 4. The patient's functional ability including ADL's, fall risks, home safety risks and hearing or visual             impairment. 5. Diet and physical activities 6. Evidence for depression or mood disorders  The patients weight, height, BMI have been recorded in the chart and visual acuity is per eye clinic.  I have made referrals, counseling and provided education to the patient based review of the above and I have provided the pt with a written personalized care plan for preventive services.  See scanned forms.  Routine anticipatory guidance given to patient.  See health maintenance. Colon cancer screening colonosc 7/14 adenomatous polyp - did not have recall  Breast cancer screening 12/14 mammogram nl - due for one -will schedule that herself at the breast center Self breast exam -no lumps or changes  Flu vaccine 9/15  Tetanus vaccine 5/05 -recommended getting that at the health dept  Pneumovax 1/05 will get prevnar today Zoster vaccine 8/09  Advance directive -she has a living will and POA (with her attorney) Cognitive function addressed- see scanned forms- and if abnormal then additional documentation follows.  No major concerns -occ forgets a name   PMH and SH  reviewed  Meds, vitals, and allergies reviewed.   ROS: See HPI.  Otherwise negative.     bp is stable today  No cp or palpitations or headaches or edema  No side effects to medicines  BP Readings from Last 3 Encounters:  10/25/14 130/72  05/24/14 142/80  05/16/14 134/92     Osteopenia D level 40 dexa 11/13- will schedule that  No falls or fx   Lipids Lab Results  Component Value Date   CHOL 206* 10/18/2014   CHOL 217* 06/09/2013   CHOL 195 06/10/2012   Lab Results  Component Value Date   HDL 65.00 10/18/2014   HDL 65.70 06/09/2013   HDL 63.50 06/10/2012   Lab Results  Component Value Date   LDLCALC 116* 10/18/2014   LDLCALC 110* 06/10/2012   Lab Results  Component Value Date   TRIG 124.0 10/18/2014   TRIG 106.0 06/09/2013   TRIG 110.0 06/10/2012   Lab Results  Component Value Date   CHOLHDL 3 10/18/2014   CHOLHDL 3 06/09/2013   CHOLHDL 3 06/10/2012   Lab Results  Component Value Date   LDLDIRECT 125.7 06/09/2013   LDLDIRECT 124.0 11/27/2011   LDLDIRECT 127.3 11/27/2010   stable and fairly controlled  Diet has been out of whack lately and no appetite with grief   Hypothyroid Lab Results  Component Value Date   TSH 5.78* 10/18/2014   this is up  She does not miss does (no more  than one in several weeks)     Patient Active Problem List   Diagnosis Date Noted  . Grief reaction 10/25/2014  . Estrogen deficiency 10/25/2014  . Acute bronchitis with bronchospasm 05/16/2014  . Lichen planus 12/20/2013  . Cough 12/20/2013  . Encounter for Medicare annual wellness exam 06/17/2013  . Joint pain 04/30/2013  . Aphthous ulcer of mouth 03/16/2013  . Tick bite 01/25/2013  . Hyperglycemia 06/03/2011  . Leg pain 05/07/2011  . HYPERTENSION, BENIGN ESSENTIAL 10/23/2010  . CONSTIPATION 10/23/2010  . HEMATOCHEZIA 10/23/2010  . OSTEOARTHRITIS, HANDS, BILATERAL 01/24/2010  . Hypothyroidism 08/24/2008  . Vitamin D deficiency 06/06/2008  . Hyperlipidemia  06/06/2008  . MENOPAUSAL SYNDROME 03/29/2008  . Osteopenia 03/29/2008  . ALLERGIC RHINITIS 01/25/2008  . IBS 01/25/2008  . OSTEOARTHRITIS 01/25/2008  . FIBROMYALGIA 01/25/2008   Past Medical History  Diagnosis Date  . Osteoarthritis   . Fibromyalgia   . Hypertension   . Hypothyroidism   . Hyperlipidemia   . Internal hemorrhoids   . Colon polyp 1999    small polyp  . Diverticulosis   . Lung nodule     stable LUL 9 mm  . Cataract     Bil/lens implant  . Allergic rhinitis   . Leg cramps   . Menopausal syndrome   . UTI (urinary tract infection)    Past Surgical History  Procedure Laterality Date  . Abdominal hysterectomy  1975    total-fibroids  . Rotator cuff repair      x 2 /right shoulder  . Skin cancer excision      pre-melanoma / on face  . Breast biopsy      benign  . Cataract extraction      bilateral  . Abd u/s  11/1998    negative  . Abd u/s  01/2001    gallbladder polyps  . Hida scan  01/2001    Negative  . Colonoscopy  1998    Diverticulosis; polyp\  . Dexa  10/2001    osteopenia  . Esophagogastroduodenoscopy  2004  . Colonoscopy  09/2002    Diverticulosis, hem  . Colonoscopy  12/2007    diverticulosis, polyp  . Appendectomy  1975  . Cholecystectomy    . Knee surgery      right knee / 11/2004   History  Substance Use Topics  . Smoking status: Never Smoker   . Smokeless tobacco: Never Used  . Alcohol Use: No     Comment: occasional   Family History  Problem Relation Age of Onset  . Parkinsonism Father   . Colon cancer Brother   . Kidney failure Son     congenital  . Allergies Mother   . Heart disease Mother    Allergies  Allergen Reactions  . Amlodipine Besylate     REACTION: muscle spasms, extremity swelling, low bp  . Amoxicillin   . Ciprofloxacin     REACTION: muscle spasms, insomnia  . Codeine     REACTION: hallucinations  . Penicillins   . Propoxyphene Hcl   . Sulfamethoxazole-Trimethoprim     REACTION: dizzy, could not  focus eyes and could not concentrate and nausea   Current Outpatient Prescriptions on File Prior to Visit  Medication Sig Dispense Refill  . albuterol (PROVENTIL HFA;VENTOLIN HFA) 108 (90 BASE) MCG/ACT inhaler Inhale 2 puffs into the lungs every 4 (four) hours as needed for wheezing. 1 Inhaler 5  . Ascorbic Acid (VITAMIN C) 1000 MG tablet Take 1,000 mg by mouth daily.      Marland Kitchen  B Complex Vitamins (VITAMIN B COMPLEX PO) Take 1 tablet by mouth daily.    . Biotin 10 MG TABS Take by mouth. daily     . brinzolamide (AZOPT) 1 % ophthalmic suspension 1 drop 2 (two) times daily.    . Calcium Carbonate-Vit D-Min 600-400 MG-UNIT TABS Take by mouth. daily     . cetirizine (ZYRTEC) 10 MG tablet Take 10 mg by mouth daily as needed for allergies.    . Cholecalciferol (VITAMIN D) 2000 UNITS CAPS Take 1 capsule by mouth 2 (two) times daily.      . clotrimazole (MYCELEX) 10 MG troche Take 10 mg by mouth daily.    . Cranberry 500 MG CAPS Take by mouth. daily    . cyclobenzaprine (FLEXERIL) 10 MG tablet TAKE ONE TABLET BY MOUTH AT BEDTIME AS NEEDED  30 tablet 3  . doxycycline (VIBRA-TABS) 100 MG tablet Take 1 tablet (100 mg total) by mouth 2 (two) times daily. 20 tablet 0  . estradiol (ESTRACE) 1 MG tablet TAKE ONE TABLET BY MOUTH ONE TIME DAILY  90 tablet 1  . fluocinonide-emollient (LIDEX-E) 0.05 % cream Apply to itching areas and body twice daily as needed    . ibuprofen (ADVIL,MOTRIN) 200 MG tablet Take 400 mg by mouth 2 (two) times daily as needed.      Marland Kitchen levothyroxine (SYNTHROID, LEVOTHROID) 88 MCG tablet TAKE 1 TABLET BY MOUTH DAILY BEFORE BREAKFAST. 90 tablet 1  . loratadine-pseudoephedrine (CLARITIN-D 24-HOUR) 10-240 MG per 24 hr tablet Take 1 tablet by mouth daily.    . Lutein 20 MG CAPS Take by mouth. daily     . montelukast (SINGULAIR) 10 MG tablet TAKE ONE TABLET BY MOUTH AT BEDTIME  90 tablet 1  . Multiple Vitamin (MULTIVITAMIN) capsule Take 1 capsule by mouth daily.      . tacrolimus (PROGRAF) 1 MG  capsule 1 mg. Dissolve 1 cap in 1/2 liter of water, swish and spit twice daily    . triamcinolone (NASACORT) 55 MCG/ACT nasal inhaler Place 2 sprays into the nose as needed.     . vitamin B-12 (CYANOCOBALAMIN) 500 MCG tablet Take 500 mcg by mouth daily.    . chlorhexidine (PERIDEX) 0.12 % solution RINSE TWICE DAILY AFTER BRUSHING USING A CAPFUL AS MARKED    . lisinopril (PRINIVIL,ZESTRIL) 10 MG tablet TAKE ONE TABLET BY MOUTH ONE TIME DAILY  (Patient not taking: Reported on 10/25/2014) 90 tablet 0   No current facility-administered medications on file prior to visit.    Review of Systems Review of Systems  Constitutional: Negative for fever, appetite change, fatigue and unexpected weight change.  Eyes: Negative for pain and visual disturbance.  Respiratory: Negative for cough and shortness of breath.   Cardiovascular: Negative for cp or palpitations    Gastrointestinal: Negative for nausea, diarrhea and constipation.  Genitourinary: Negative for urgency and frequency.  Skin: Negative for pallor or rash   Neurological: Negative for weakness, light-headedness, numbness and headaches.  Hematological: Negative for adenopathy. Does not bruise/bleed easily.  Psychiatric/Behavioral: Negative for dysphoric mood. The patient is not nervous/anxious.  pos for grief        Objective:   Physical Exam  Constitutional: She appears well-developed and well-nourished. No distress.  obese and well appearing   HENT:  Head: Normocephalic and atraumatic.  Right Ear: External ear normal.  Left Ear: External ear normal.  Mouth/Throat: Oropharynx is clear and moist.  Eyes: Conjunctivae and EOM are normal. Pupils are equal, round, and reactive to light.  No scleral icterus.  Neck: Normal range of motion. Neck supple. No JVD present. Carotid bruit is not present. No thyromegaly present.  Cardiovascular: Normal rate, regular rhythm, normal heart sounds and intact distal pulses.  Exam reveals no gallop.     Pulmonary/Chest: Effort normal and breath sounds normal. No respiratory distress. She has no wheezes. She exhibits no tenderness.  Abdominal: Soft. Bowel sounds are normal. She exhibits no distension, no abdominal bruit and no mass. There is no tenderness.  Genitourinary: No breast swelling, tenderness, discharge or bleeding.  Breast exam: No mass, nodules, thickening, tenderness, bulging, retraction, inflamation, nipple discharge or skin changes noted.  No axillary or clavicular LA.      Musculoskeletal: Normal range of motion. She exhibits no edema or tenderness.  No kyphosis   Lymphadenopathy:    She has no cervical adenopathy.  Neurological: She is alert. She has normal reflexes. No cranial nerve deficit. She exhibits normal muscle tone. Coordination normal.  Skin: Skin is warm and dry. No rash noted. No erythema. No pallor.  Psychiatric: She has a normal mood and affect.          Assessment & Plan:   Problem List Items Addressed This Visit      Cardiovascular and Mediastinum   HYPERTENSION, BENIGN ESSENTIAL    bp in fair control at this time  BP Readings from Last 1 Encounters:  10/25/14 130/72   No changes needed Disc lifstyle change with low sodium diet and exercise  Labs reviewed         Endocrine   Hypothyroidism    Lab Results  Component Value Date   TSH 5.78* 10/18/2014   Pt may have missed one dose  Re check 2 wk and if still high will inc levothyroxine No clinical changes       Relevant Orders   TSH     Musculoskeletal and Integument   Osteopenia    Is due for dexa-will schedule that  Disc need for calcium/ vitamin D/ wt bearing exercise and bone density test every 2 y to monitor Disc safety/ fracture risk in detail           Other   Encounter for Medicare annual wellness exam - Primary    Reviewed health habits including diet and exercise and skin cancer prevention Reviewed appropriate screening tests for age  Also reviewed health mt list,  fam hx and immunization status , as well as social and family history   See HPI Lab reviewed  prevnar today She will get Tdap at the health dept  Pt will schedule her own mammogram        Estrogen deficiency   Relevant Orders   DG Bone Density   Grief reaction    Lost husband to lung cancer  Pt thinks she is doing fairly well  Has good support Reviewed stressors/ coping techniques/symptoms/ support sources/ tx options and side effects in detail today  Offered counseling- she declined at this time      Hyperlipidemia    Disc goals for lipids and reasons to control them Rev labs with pt Rev low sat fat diet in detail Fairly well controlled       Vitamin D deficiency    Vitamin D level is therapeutic with current supplementation Disc importance of this to bone and overall health        Other Visit Diagnoses    Need for pneumococcal vaccine        Relevant Orders  Pneumococcal conjugate vaccine 13-valent IM (Completed)

## 2014-10-25 NOTE — Patient Instructions (Signed)
Don't forget to schedule your mammogram  Get your tetanus shot at the health dept. (Tdap) prevnar vaccine today  Stop at check out for referral for dexa and also lab appt 2 weeks to re check thyroid

## 2014-10-25 NOTE — Progress Notes (Signed)
Pre visit review using our clinic review tool, if applicable. No additional management support is needed unless otherwise documented below in the visit note. 

## 2014-10-26 ENCOUNTER — Telehealth: Payer: Self-pay | Admitting: Family Medicine

## 2014-10-26 NOTE — Telephone Encounter (Signed)
emmi emailed °

## 2014-10-27 ENCOUNTER — Other Ambulatory Visit: Payer: Self-pay

## 2014-10-27 ENCOUNTER — Other Ambulatory Visit: Payer: Self-pay | Admitting: Family Medicine

## 2014-10-27 DIAGNOSIS — Z1231 Encounter for screening mammogram for malignant neoplasm of breast: Secondary | ICD-10-CM

## 2014-10-27 NOTE — Assessment & Plan Note (Signed)
Is due for dexa-will schedule that  Disc need for calcium/ vitamin D/ wt bearing exercise and bone density test every 2 y to monitor Disc safety/ fracture risk in detail

## 2014-10-27 NOTE — Assessment & Plan Note (Signed)
bp in fair control at this time  BP Readings from Last 1 Encounters:  10/25/14 130/72   No changes needed Disc lifstyle change with low sodium diet and exercise  Labs reviewed

## 2014-10-27 NOTE — Assessment & Plan Note (Signed)
Vitamin D level is therapeutic with current supplementation Disc importance of this to bone and overall health  

## 2014-10-27 NOTE — Assessment & Plan Note (Signed)
Lab Results  Component Value Date   TSH 5.78* 10/18/2014   Pt may have missed one dose  Re check 2 wk and if still high will inc levothyroxine No clinical changes

## 2014-10-27 NOTE — Assessment & Plan Note (Signed)
Disc goals for lipids and reasons to control them Rev labs with pt Rev low sat fat diet in detail  Fairly well controlled 

## 2014-10-27 NOTE — Assessment & Plan Note (Signed)
Reviewed health habits including diet and exercise and skin cancer prevention Reviewed appropriate screening tests for age  Also reviewed health mt list, fam hx and immunization status , as well as social and family history   See HPI Lab reviewed  prevnar today She will get Tdap at the health dept  Pt will schedule her own mammogram

## 2014-10-27 NOTE — Assessment & Plan Note (Signed)
Lost husband to lung cancer  Pt thinks she is doing fairly well  Has good support Reviewed stressors/ coping techniques/symptoms/ support sources/ tx options and side effects in detail today  Offered counseling- she declined at this time

## 2014-10-31 ENCOUNTER — Ambulatory Visit
Admission: RE | Admit: 2014-10-31 | Discharge: 2014-10-31 | Disposition: A | Discharge status: Home / Self Care | Payer: Medicare Other | Source: Ambulatory Visit | Attending: Family Medicine | Admitting: Family Medicine

## 2014-10-31 ENCOUNTER — Ambulatory Visit
Admission: RE | Admit: 2014-10-31 | Discharge: 2014-10-31 | Disposition: A | Discharge status: Home / Self Care | Payer: Medicare Other | Source: Ambulatory Visit

## 2014-10-31 DIAGNOSIS — Z1231 Encounter for screening mammogram for malignant neoplasm of breast: Secondary | ICD-10-CM

## 2014-10-31 DIAGNOSIS — M85852 Other specified disorders of bone density and structure, left thigh: Secondary | ICD-10-CM | POA: Diagnosis not present

## 2014-10-31 DIAGNOSIS — M85832 Other specified disorders of bone density and structure, left forearm: Secondary | ICD-10-CM | POA: Diagnosis not present

## 2014-10-31 DIAGNOSIS — Z780 Asymptomatic menopausal state: Secondary | ICD-10-CM | POA: Diagnosis not present

## 2014-10-31 DIAGNOSIS — E2839 Other primary ovarian failure: Secondary | ICD-10-CM

## 2014-11-01 ENCOUNTER — Telehealth: Payer: Self-pay

## 2014-11-01 NOTE — Telephone Encounter (Signed)
Patient aware of normal mammogram and recommendations.

## 2014-11-01 NOTE — Telephone Encounter (Signed)
-----   Message from Abner Greenspan, MD sent at 10/31/2014  9:50 PM EST ----- Mammogram is normal  Please note for flow sheet if you can  Due for next screening mammogram in 1 year

## 2014-11-02 ENCOUNTER — Telehealth: Payer: Self-pay

## 2014-11-02 NOTE — Telephone Encounter (Signed)
-----   Message from Abner Greenspan, MD sent at 11/02/2014 10:03 AM EST ----- Osteopenia is slightly worse at the hip (femoral neck)  If she is interested in medication to lower her fracture risk -follow up or we can disc the next time she is here- there are several options- but we usually start with medication in the class of fosamax Take ca and D Note for health mt please  thanks

## 2014-11-02 NOTE — Telephone Encounter (Signed)
Left message for patient to call office to inform her of bone density results and recommendations.

## 2014-11-03 ENCOUNTER — Telehealth: Payer: Self-pay

## 2014-11-03 NOTE — Telephone Encounter (Signed)
Patient aware of bone scan results and recommendations.

## 2014-11-03 NOTE — Telephone Encounter (Signed)
-----   Message from Abner Greenspan, MD sent at 11/02/2014 10:03 AM EST ----- Osteopenia is slightly worse at the hip (femoral neck)  If she is interested in medication to lower her fracture risk -follow up or we can disc the next time she is here- there are several options- but we usually start with medication in the class of fosamax Take ca and D Note for health mt please  thanks

## 2014-11-07 DIAGNOSIS — Z23 Encounter for immunization: Secondary | ICD-10-CM | POA: Diagnosis not present

## 2014-11-08 ENCOUNTER — Other Ambulatory Visit (INDEPENDENT_AMBULATORY_CARE_PROVIDER_SITE_OTHER): Payer: Medicare Other

## 2014-11-08 DIAGNOSIS — E039 Hypothyroidism, unspecified: Secondary | ICD-10-CM

## 2014-11-08 LAB — TSH: TSH: 3.48 u[IU]/mL (ref 0.35–4.50)

## 2014-11-09 ENCOUNTER — Telehealth: Payer: Self-pay

## 2014-11-09 NOTE — Telephone Encounter (Signed)
Patient aware of tsh results and no medication change at this time.

## 2014-11-09 NOTE — Telephone Encounter (Signed)
-----   Message from Abner Greenspan, MD sent at 11/08/2014  4:57 PM EDT ----- Thyroid lab is ok  No change in dose needed

## 2014-12-05 ENCOUNTER — Ambulatory Visit (INDEPENDENT_AMBULATORY_CARE_PROVIDER_SITE_OTHER): Payer: Medicare Other | Admitting: Family Medicine

## 2014-12-05 ENCOUNTER — Encounter: Payer: Self-pay | Admitting: Family Medicine

## 2014-12-05 VITALS — BP 165/88 | HR 68 | Temp 97.4°F | Ht 63.0 in | Wt 192.4 lb

## 2014-12-05 DIAGNOSIS — B3731 Acute candidiasis of vulva and vagina: Secondary | ICD-10-CM | POA: Insufficient documentation

## 2014-12-05 DIAGNOSIS — B373 Candidiasis of vulva and vagina: Secondary | ICD-10-CM | POA: Diagnosis not present

## 2014-12-05 DIAGNOSIS — F4321 Adjustment disorder with depressed mood: Secondary | ICD-10-CM | POA: Diagnosis not present

## 2014-12-05 LAB — POCT WET PREP (WET MOUNT): KOH Wet Prep POC: POSITIVE

## 2014-12-05 MED ORDER — FLUCONAZOLE 150 MG PO TABS: ORAL_TABLET | ORAL | Status: DC

## 2014-12-05 MED ORDER — BUPROPION HCL ER (XL) 150 MG PO TB24: 150.0000 mg | ORAL_TABLET | Freq: Every day | ORAL | Status: DC

## 2014-12-05 NOTE — Patient Instructions (Addendum)
Take the diflucan for yeast  Keep area dry and clean  Desitin  is ok to use over the counter on the outer vulva  Continue cold compresses as needed   Update if not starting to improve in a week or if worsening     For depression and grief - you can try the wellbutrin if you want to  If it causes side effects or makes depression worse - stop it and let me know

## 2014-12-05 NOTE — Progress Notes (Signed)
Pre visit review using our clinic review tool, if applicable. No additional management support is needed unless otherwise documented below in the visit note. 

## 2014-12-05 NOTE — Progress Notes (Signed)
Subjective:    Patient ID: Linda Mcgrath, female    DOB: 27-Aug-1932, 81 y.o.   MRN: [id number]  HPI Here with symptoms of vaginitis and grief   Also still battling grief  Feels a bit mentally cloudy  No grief counseling - is very busy dealing with the estate  Has access to hospice  She can usually distract herself with a book or something else Has a very positive attitude  At times looses motivated    Middle of last week - had some diarrhea and lost continence once and had to clean up well  A few bumps came up on inside of legs after that- they went away Cleaned with soap and water   By Friday- had vaginal itching  Went to drugstore and bought monistat one day treatment and also the itch cream externally  A few hours later her symptoms worsened = wondered if she may have been allergic to the medication  Now intermittent itching and burning making it hard to sleep   Last night she washed area with a non medicated eye wash solution that is very mild  Used a cream-helped swelling (fluocinonide .05%    (orig for lichen planus) Also used an ice pack- that helped a lot   A little soreness in groin area Incontinence of urine is bad - as usual /has to use pads which irritates her  She changes frequently   Review of Systems Review of Systems  Constitutional: Negative for fever, appetite change, fatigue and unexpected weight change.  Eyes: Negative for pain and visual disturbance.  Respiratory: Negative for cough and shortness of breath.   Cardiovascular: Negative for cp or palpitations    Gastrointestinal: Negative for nausea, diarrhea and constipation.  Genitourinary: Negative for urgency and frequency. neg for vag d/c  Skin: Negative for pallor or rash   Neurological: Negative for weakness, light-headedness, numbness and headaches.  Hematological: Negative for adenopathy. Does not bruise/bleed easily.  Psychiatric/Behavioral: pos for dysphoric mood. The patient is  not nervous/anxious.  Neg for SI       Objective:   Physical Exam  Constitutional: She appears well-developed and well-nourished. No distress.  obese and well appearing   HENT:  Head: Normocephalic and atraumatic.  Eyes: Conjunctivae and EOM are normal. Pupils are equal, round, and reactive to light. No scleral icterus.  Neck: Normal range of motion. Neck supple.  Cardiovascular: Normal rate, regular rhythm and normal heart sounds.   Pulmonary/Chest: Effort normal and breath sounds normal.  Abdominal: Soft. Bowel sounds are normal. She exhibits no distension and no mass. There is no tenderness. There is no rebound and no guarding.  Genitourinary: There is rash and tenderness on the right labia. There is rash and tenderness on the left labia. There is erythema and tenderness in the vagina. No bleeding in the vagina.  Labia are edematous and tender No mucosal breakdown or plaques   Musculoskeletal: She exhibits no edema.  Lymphadenopathy:    She has no cervical adenopathy.  Neurological: She is alert. She has normal reflexes.  Skin: Skin is warm and dry. There is erythema. No pallor.  Psychiatric: Her speech is normal and behavior is normal. Her mood appears not anxious. Her affect is not blunt, not labile and not inappropriate. Thought content is not paranoid. She exhibits a depressed mood. She expresses no homicidal and no suicidal ideation.  Tearful at times when discussing grief           Assessment & Plan:  Problem List Items Addressed This Visit      Genitourinary   Yeast vaginitis - Primary    With adv rxn to monistat topical otc  tx with diflucan - 3 dose regimen  Adv to keep area clean and dry Update if not starting to improve in a week or if worsening        Relevant Medications   fluconazole (DIFLUCAN) tablet 150 mg     Other   Grief reaction    Reviewed stressors/ coping techniques/symptoms/ support sources/ tx options and side effects in detail today-pt  lost husb to lung ca in 2/16 Enc pt to seek out grief counseling- she plans to  Given px for wellbutrin 150 xl for vegetative symptoms - she will think about filling it  Discussed expectations of this  medication including time to effectiveness and mechanism of action, also poss of side effects (early and late)- including mental fuzziness, weight or appetite change, nausea and poss of worse dep or anxiety (even suicidal thoughts)  Pt voiced understanding and will stop med and update if this occurs    Will update and f/u planned

## 2014-12-05 NOTE — Assessment & Plan Note (Signed)
With adv rxn to monistat topical otc  tx with diflucan - 3 dose regimen  Adv to keep area clean and dry Update if not starting to improve in a week or if worsening

## 2014-12-05 NOTE — Assessment & Plan Note (Signed)
Reviewed stressors/ coping techniques/symptoms/ support sources/ tx options and side effects in detail today-pt lost husb to lung ca in 2/16 Enc pt to seek out grief counseling- she plans to  Given px for wellbutrin 150 xl for vegetative symptoms - she will think about filling it  Discussed expectations of this  medication including time to effectiveness and mechanism of action, also poss of side effects (early and late)- including mental fuzziness, weight or appetite change, nausea and poss of worse dep or anxiety (even suicidal thoughts)  Pt voiced understanding and will stop med and update if this occurs    Will update and f/u planned

## 2014-12-06 DIAGNOSIS — H33312 Horseshoe tear of retina without detachment, left eye: Secondary | ICD-10-CM | POA: Diagnosis not present

## 2014-12-06 DIAGNOSIS — H43813 Vitreous degeneration, bilateral: Secondary | ICD-10-CM | POA: Diagnosis not present

## 2014-12-07 DIAGNOSIS — B370 Candidal stomatitis: Secondary | ICD-10-CM | POA: Diagnosis not present

## 2014-12-07 DIAGNOSIS — L438 Other lichen planus: Secondary | ICD-10-CM | POA: Diagnosis not present

## 2014-12-07 DIAGNOSIS — Z5181 Encounter for therapeutic drug level monitoring: Secondary | ICD-10-CM | POA: Diagnosis not present

## 2014-12-07 DIAGNOSIS — L439 Lichen planus, unspecified: Secondary | ICD-10-CM | POA: Diagnosis not present

## 2014-12-09 ENCOUNTER — Other Ambulatory Visit: Payer: Self-pay | Admitting: Family Medicine

## 2014-12-11 ENCOUNTER — Other Ambulatory Visit: Payer: Self-pay | Admitting: Family Medicine

## 2014-12-12 NOTE — Telephone Encounter (Signed)
Received refill request electronically from pharmacy. Last refill 08/11/14 #30/3, last office visit 12/05/14. Is it okay to refill medication?

## 2014-12-12 NOTE — Telephone Encounter (Signed)
Will refill electronically  

## 2015-01-09 ENCOUNTER — Other Ambulatory Visit: Payer: Self-pay | Admitting: Family Medicine

## 2015-01-17 DIAGNOSIS — S46002A Unspecified injury of muscle(s) and tendon(s) of the rotator cuff of left shoulder, initial encounter: Secondary | ICD-10-CM | POA: Diagnosis not present

## 2015-01-24 ENCOUNTER — Encounter: Payer: Self-pay | Admitting: Family Medicine

## 2015-01-24 ENCOUNTER — Ambulatory Visit (INDEPENDENT_AMBULATORY_CARE_PROVIDER_SITE_OTHER): Payer: Medicare Other | Admitting: Family Medicine

## 2015-01-24 VITALS — BP 124/84 | HR 73 | Temp 98.3°F | Ht 63.0 in | Wt 190.0 lb

## 2015-01-24 DIAGNOSIS — B3731 Acute candidiasis of vulva and vagina: Secondary | ICD-10-CM

## 2015-01-24 DIAGNOSIS — B373 Candidiasis of vulva and vagina: Secondary | ICD-10-CM | POA: Diagnosis not present

## 2015-01-24 DIAGNOSIS — N3941 Urge incontinence: Secondary | ICD-10-CM

## 2015-01-24 LAB — POCT WET PREP (WET MOUNT)

## 2015-01-24 MED ORDER — FLUCONAZOLE 150 MG PO TABS: ORAL_TABLET | ORAL | Status: DC

## 2015-01-24 NOTE — Assessment & Plan Note (Signed)
Recurrent possibly due to dampness / urinary incontinence and pad wearing  tx with diflucan times 3 in 9 days  Disc imp of keeping area dry and clean  Plans to see urology re: the urinary issue  Update if not starting to improve in a 10 days or if worsening

## 2015-01-24 NOTE — Progress Notes (Signed)
Subjective:    Patient ID: Linda Mcgrath, female    DOB: 1933-02-26, 81 y.o.   MRN: [id number]  HPI Here with suspected yeast infection Due to everything staying damp  Some itching and irritation She does use a barrier cream -to protect the area (skin is thin) Tries not to scratch No vag d/c or odor  Not sexually active right now      Has had frequency of urination lately (overactive bladder) Goes every 30 minutes and bad incontinence as well  She thinks she empties all the way - just no control when she does go   (urge incontinence)  Some stress incontinence-not as bad  May be interested in urol ref   When she had her hysterectomy =had her urethra tacked up  Then had to reverse it and put in a urethral stent  Long hx of bladder infection   Saw Dr Isabel Caprice in the past - would like an appt    She takes estrace orally   Decided to go ahead and join a grief counseling group with hospice     Review of Systems Review of Systems  Constitutional: Negative for fever, appetite change, fatigue and unexpected weight change.  Eyes: Negative for pain and visual disturbance.  Respiratory: Negative for cough and shortness of breath.   Cardiovascular: Negative for cp or palpitations    Gastrointestinal: Negative for nausea, diarrhea and constipation.  Genitourinary: pos for urgency and frequency. Neg for dysuria/ pos for vaginal itching and irritation, neg for pelvic pain  Skin: Negative for pallor or rash   Neurological: Negative for weakness, light-headedness, numbness and headaches.  Hematological: Negative for adenopathy. Does not bruise/bleed easily.  Psychiatric/Behavioral: Negative for dysphoric mood. The patient is not nervous/anxious.         Objective:   Physical Exam  Constitutional: She appears well-developed and well-nourished. No distress.  obese and well appearing   HENT:  Head: Normocephalic and atraumatic.  Eyes: Conjunctivae and EOM are normal.  Pupils are equal, round, and reactive to light.  Neck: Normal range of motion. Neck supple.  Cardiovascular: Normal rate, regular rhythm and normal heart sounds.   Pulmonary/Chest: Effort normal and breath sounds normal.  Abdominal: Soft. Bowel sounds are normal. She exhibits no distension. There is tenderness. There is no rebound.  No cva tenderness  Mild suprapubic tenderness  Genitourinary:  General irritation of outer vulva with scant white d/c  No denuded areas or plaques  Very small introitus  No M on bimanual  Atrophic changes noted Wet prep specimen obt  Musculoskeletal: She exhibits no edema.  Lymphadenopathy:    She has no cervical adenopathy.  Neurological: She is alert.  Skin: No rash noted.  Psychiatric: She has a normal mood and affect.          Assessment & Plan:   Problem List Items Addressed This Visit    Urge incontinence - Primary    Pt has hx of hysterectomy with several bladder/urethral procedures in the past  req ref to urology for urge incont that is getting much worse  Denies dysuria or infx symptoms  Ref done       Relevant Orders   Ambulatory referral to Urology   Yeast vaginitis    Recurrent possibly due to dampness / urinary incontinence and pad wearing  tx with diflucan times 3 in 9 days  Disc imp of keeping area dry and clean  Plans to see urology re: the urinary issue  Update if  not starting to improve in a 10 days or if worsening        Relevant Medications   fluconazole (DIFLUCAN) 150 MG tablet   Other Relevant Orders   POCT Wet Prep [facility])

## 2015-01-24 NOTE — Assessment & Plan Note (Signed)
Pt has hx of hysterectomy with several bladder/urethral procedures in the past  req ref to urology for urge incont that is getting much worse  Denies dysuria or infx symptoms  Ref done

## 2015-01-24 NOTE — Progress Notes (Signed)
Pre visit review using our clinic review tool, if applicable. No additional management support is needed unless otherwise documented below in the visit note. 

## 2015-01-24 NOTE — Patient Instructions (Signed)
Try to keep vulvar area as dry as possible - change pad frequently  Barrier cream externally is ok as needed  Take the diflucan for yeast - if not improved in 10 days please let me know  Stop at check out for referral to urology

## 2015-01-30 ENCOUNTER — Ambulatory Visit: Payer: Medicare Other | Attending: Orthopaedic Surgery | Admitting: Physical Therapy

## 2015-01-30 ENCOUNTER — Encounter: Payer: Self-pay | Admitting: Physical Therapy

## 2015-01-30 DIAGNOSIS — M25612 Stiffness of left shoulder, not elsewhere classified: Secondary | ICD-10-CM | POA: Diagnosis not present

## 2015-01-30 DIAGNOSIS — M25512 Pain in left shoulder: Secondary | ICD-10-CM | POA: Diagnosis not present

## 2015-01-30 NOTE — Therapy (Signed)
[facility]- [place] [address] [place], [place], [address] Phone: [phone]   Fax:  [phone]  Physical Therapy Evaluation  Patient Details  Name: Linda Mcgrath MRN: [id number] Date of Birth: 1932-11-10 Referring Provider:  Gypsy Lore, MD  Encounter Date: 01/30/2015      PT End of Session - 01/30/15 1511    Visit Number 1   Date for PT Re-Evaluation 04/01/15   PT Start Time 1429   PT Stop Time 1530   PT Time Calculation (min) 61 min   Activity Tolerance Patient tolerated treatment well      Past Medical History  Diagnosis Date  . Osteoarthritis   . Fibromyalgia   . Hypertension   . Hypothyroidism   . Hyperlipidemia   . Internal hemorrhoids   . Colon polyp 1999    small polyp  . Diverticulosis   . Lung nodule     stable LUL 9 mm  . Cataract     Bil/lens implant  . Allergic rhinitis   . Leg cramps   . Menopausal syndrome   . UTI (urinary tract infection)     Past Surgical History  Procedure Laterality Date  . Abdominal hysterectomy  1975    total-fibroids  . Rotator cuff repair      x 2 /right shoulder  . Skin cancer excision      pre-melanoma / on face  . Breast biopsy      benign  . Cataract extraction      bilateral  . Abd u/s  11/1998    negative  . Abd u/s  01/2001    gallbladder polyps  . Hida scan  01/2001    Negative  . Colonoscopy  1998    Diverticulosis; polyp\  . Dexa  10/2001    osteopenia  . Esophagogastroduodenoscopy  2004  . Colonoscopy  09/2002    Diverticulosis, hem  . Colonoscopy  12/2007    diverticulosis, polyp  . Appendectomy  1975  . Cholecystectomy    . Knee surgery      right knee / 11/2004    There were no vitals filed for this visit.  Visit Diagnosis:  Left shoulder pain - Plan: PT plan of care cert/re-cert  Shoulder stiffness, left - Plan: PT plan of care cert/re-cert      Subjective Assessment - 01/30/15 1451    Subjective Patient  reports that she has been having increased shoulder pain Left > right over the past year, she reports that her husbands health was declining and she was having to lift him some, until he passed away.   Diagnostic tests x-rays negative "no tears"   Patient Stated Goals less pain with ADL's   Currently in Pain? Yes   Pain Score 7    Pain Location Shoulder   Pain Orientation Left   Pain Descriptors / Indicators Aching   Pain Type Chronic pain   Pain Onset More than a month ago   Aggravating Factors  work in the yard, sleeping with arm overhead, reaching up above head and behind the back   Pain Relieving Factors rest   Effect of Pain on Daily Activities limits ability to dress and do ADL's            [facility] PT Assessment - 01/30/15 0001    Assessment   Medical Diagnosis shoulder pain   Onset Date/Surgical Date 01/17/15   Hand Dominance Right   Precautions   Precautions  None   Balance Screen   Has the patient fallen in the past 6 months No   Has the patient had a decrease in activity level because of a fear of falling?  No   Is the patient reluctant to leave their home because of a fear of falling?  No   Home Environment   Additional Comments lives alone, does house and yardwork but is getting help   Prior Function   Level of Independence Independent   Vocation Retired   Leisure Immunologist Comments fwd head, rounded shoulders   AROM   Left Shoulder Flexion 125 Degrees   Left Shoulder ABduction 85 Degrees   Left Shoulder Internal Rotation 15 Degrees   Left Shoulder External Rotation 65 Degrees   Strength   Overall Strength Comments 3+/5 with pain for the left   Palpation   Palpation comment very tight and tender in the right upper trap and into the neck, she is tender in the anterior and lateral left shoulder.  Some palpable and audible crepitus.   Special Tests    Special Tests Rotator Cuff Impingement   Rotator Cuff Impingment tests  Leanord Asal test;Empty Can test   Hawkins-Kennedy test   Findings Positive   Side Left   Empty Can test   Findings Positive   Side Left                   [facility] Adult PT Treatment/Exercise - 01/30/15 0001    Modalities   Modalities Electrical Stimulation;Iontophoresis   Programme researcher, broadcasting/film/video Location left shoulder   Electrical Stimulation Action IFC   Electrical Stimulation Parameters to tolerance   Electrical Stimulation Goals Pain   Iontophoresis   Type of Iontophoresis Dexamethasone   Location left shoudler anterior/lateral   Dose 80mA   Time 4 hour patch                  PT Short Term Goals - 01/30/15 1514    PT SHORT TERM GOAL #1   Title independent with HEP   Time 2   Period Weeks   Status New           PT Long Term Goals - 01/30/15 1514    PT LONG TERM GOAL #1   Title decrease pain 50%   Time 8   Period Weeks   Status New   PT LONG TERM GOAL #2   Title increase AROm of the left shoulder to 120 degrees abduction   Time 8   Period Weeks   Status New   PT LONG TERM GOAL #3   Title Dress and do hair with minimal difficulty   Time 8   Period Weeks   Status New   PT LONG TERM GOAL #4   Title 25% reported less difficulty sleeping   Time 8   Period Weeks   Status New               Plan - 01/30/15 1512    Clinical Impression Statement Patient with significant left shoulder pain after caring for her husband the past year.  She has a positive empty can and impingement sign on the left.  Decreased ROM for abduction and IR   Pt will benefit from skilled therapeutic intervention in order to improve on the following deficits Decreased range of motion;Decreased strength;Increased muscle spasms;Impaired UE functional use;Pain   Rehab Potential Good   PT Frequency 2x /  week   PT Duration 8 weeks   PT Treatment/Interventions Electrical Stimulation;Moist Heat;Iontophoresis 4mg /ml  Dexamethasone;Ultrasound;Therapeutic exercise;Therapeutic activities;Patient/family education;Manual techniques   PT Next Visit Plan add exercises/HEP   Consulted and Agree with Plan of Care Patient          G-Codes - February 17, 2015 1515    Functional Assessment Tool Used foto   Functional Limitation Other PT primary   Other PT Primary Current Status (N8295) At least 40 percent but less than 60 percent impaired, limited or restricted   Other PT Primary Goal Status (A2130) At least 20 percent but less than 40 percent impaired, limited or restricted       Problem List Patient Active Problem List   Diagnosis Date Noted  . Urge incontinence 01/24/2015  . Yeast vaginitis 12/05/2014  . Grief reaction 10/25/2014  . Estrogen deficiency 10/25/2014  . Acute bronchitis with bronchospasm 05/16/2014  . Lichen planus 12/20/2013  . Cough 12/20/2013  . Encounter for Medicare annual wellness exam 06/17/2013  . Joint pain 04/30/2013  . Aphthous ulcer of mouth 03/16/2013  . Tick bite 01/25/2013  . Hyperglycemia 06/03/2011  . Leg pain 05/07/2011  . HYPERTENSION, BENIGN ESSENTIAL 10/23/2010  . CONSTIPATION 10/23/2010  . HEMATOCHEZIA 10/23/2010  . OSTEOARTHRITIS, HANDS, BILATERAL 01/24/2010  . Hypothyroidism 08/24/2008  . Vitamin D deficiency 06/06/2008  . Hyperlipidemia 06/06/2008  . MENOPAUSAL SYNDROME 03/29/2008  . Osteopenia 03/29/2008  . ALLERGIC RHINITIS 01/25/2008  . IBS 01/25/2008  . OSTEOARTHRITIS 01/25/2008  . FIBROMYALGIA 01/25/2008    Jearld Lesch., PT 02/17/15, 3:29 PM  [facility]- [address] [place] [address] [place], [place], [address] Phone: [phone]   Fax:  [phone]

## 2015-02-01 ENCOUNTER — Ambulatory Visit: Payer: Medicare Other | Admitting: Rehabilitation

## 2015-02-01 DIAGNOSIS — M25612 Stiffness of left shoulder, not elsewhere classified: Secondary | ICD-10-CM | POA: Diagnosis not present

## 2015-02-01 DIAGNOSIS — M25512 Pain in left shoulder: Secondary | ICD-10-CM | POA: Diagnosis not present

## 2015-02-01 NOTE — Therapy (Signed)
[facility] [place] [address] [place], [place], [address] Phone: [phone]   Fax:  [phone]  Physical Therapy Treatment  Patient Details  Name: Linda Mcgrath MRN: [id number] Date of Birth: 05-22-1933 Referring Provider:  Gypsy Lore, MD  Encounter Date: 02/01/2015      PT End of Session - 02/01/15 1053    Visit Number 2   PT Start Time 0945   PT Stop Time 1045   PT Time Calculation (min) 60 min      Past Medical History  Diagnosis Date  . Osteoarthritis   . Fibromyalgia   . Hypertension   . Hypothyroidism   . Hyperlipidemia   . Internal hemorrhoids   . Colon polyp 1999    small polyp  . Diverticulosis   . Lung nodule     stable LUL 9 mm  . Cataract     Bil/lens implant  . Allergic rhinitis   . Leg cramps   . Menopausal syndrome   . UTI (urinary tract infection)     Past Surgical History  Procedure Laterality Date  . Abdominal hysterectomy  1975    total-fibroids  . Rotator cuff repair      x 2 /right shoulder  . Skin cancer excision      pre-melanoma / on face  . Breast biopsy      benign  . Cataract extraction      bilateral  . Abd u/s  11/1998    negative  . Abd u/s  01/2001    gallbladder polyps  . Hida scan  01/2001    Negative  . Colonoscopy  1998    Diverticulosis; polyp\  . Dexa  10/2001    osteopenia  . Esophagogastroduodenoscopy  2004  . Colonoscopy  09/2002    Diverticulosis, hem  . Colonoscopy  12/2007    diverticulosis, polyp  . Appendectomy  1975  . Cholecystectomy    . Knee surgery      right knee / 11/2004    There were no vitals filed for this visit.  Visit Diagnosis:  Left shoulder pain      Subjective Assessment - 02/01/15 1035    Subjective States feels ok.   No worse or better noted with ionto, but wants continue trying it.    HP/IFC helps   Currently in Pain? Yes   Pain Score 6    Pain Location Shoulder   Pain Orientation Left   Pain  Descriptors / Indicators Aching;Sore   Pain Type Chronic pain                         [facility] Adult PT Treatment/Exercise - 02/01/15 0001    Exercises   Exercises Shoulder   Shoulder Exercises: Supine   External Rotation Weight (lbs) 1#   External Rotation Limitations 20   Shoulder Flexion Weight (lbs) cane press   Flexion Limitations x 20   Shoulder Exercises: Prone   Other Prone Exercises bent over rows, extension 1# x 20   Other Prone Exercises pendulums x 20 CW/CCW   Shoulder Exercises: Standing   Theraband Level (Shoulder Extension) Level 1 (Yellow)   Extension Limitations 20   Theraband Level (Shoulder Row) Level 2 (Red)   Row Limitations 20   Shoulder Exercises: ROM/Strengthening   UBE (Upper Arm Bike) 2' F/ 2' B   Programme researcher, broadcasting/film/video Location L shoulder   Electrical Stimulation  Action IFC   Electrical Stimulation Parameters to tolerance   Electrical Stimulation Goals Pain   Iontophoresis   Type of Iontophoresis Dexamethasone   Location L shoulder   Dose 90mA*min   Time 4 hr patch                PT Education - 02/01/15 1036    Education provided Yes   Education Details soup can HEP   Person(s) Educated Patient   Methods Explanation;Demonstration   Comprehension Verbalized understanding;Returned demonstration;Tactile cues required;Verbal cues required;Need further instruction          PT Short Term Goals - 01/30/15 1514    PT SHORT TERM GOAL #1   Title independent with HEP   Time 2   Period Weeks   Status New           PT Long Term Goals - 01/30/15 1514    PT LONG TERM GOAL #1   Title decrease pain 50%   Time 8   Period Weeks   Status New   PT LONG TERM GOAL #2   Title increase AROm of the left shoulder to 120 degrees abduction   Time 8   Period Weeks   Status New   PT LONG TERM GOAL #3   Title Dress and do hair with minimal difficulty   Time 8   Period Weeks   Status New   PT LONG TERM  GOAL #4   Title 25% reported less difficulty sleeping   Time 8   Period Weeks   Status New               Plan - 02/01/15 1055    Clinical Impression Statement Does well with HEP initiation today, but needs further review and teaching for proper form.   All movements hurt in general.     PT Next Visit Plan slowly advance exercises.   Add sidelying ER, ?HABD, unilateral tower pulldowns, Cybex row/PD        Problem List Patient Active Problem List   Diagnosis Date Noted  . Urge incontinence 01/24/2015  . Yeast vaginitis 12/05/2014  . Grief reaction 10/25/2014  . Estrogen deficiency 10/25/2014  . Acute bronchitis with bronchospasm 05/16/2014  . Lichen planus 12/20/2013  . Cough 12/20/2013  . Encounter for Medicare annual wellness exam 06/17/2013  . Joint pain 04/30/2013  . Aphthous ulcer of mouth 03/16/2013  . Tick bite 01/25/2013  . Hyperglycemia 06/03/2011  . Leg pain 05/07/2011  . HYPERTENSION, BENIGN ESSENTIAL 10/23/2010  . CONSTIPATION 10/23/2010  . HEMATOCHEZIA 10/23/2010  . OSTEOARTHRITIS, HANDS, BILATERAL 01/24/2010  . Hypothyroidism 08/24/2008  . Vitamin D deficiency 06/06/2008  . Hyperlipidemia 06/06/2008  . MENOPAUSAL SYNDROME 03/29/2008  . Osteopenia 03/29/2008  . ALLERGIC RHINITIS 01/25/2008  . IBS 01/25/2008  . OSTEOARTHRITIS 01/25/2008  . FIBROMYALGIA 01/25/2008    Jessikah Dicker, PT 02/01/2015, 10:59 AM  [facility]- [place] [address] [place], [place], [address] Phone: [phone]   Fax:  [phone]

## 2015-02-06 ENCOUNTER — Ambulatory Visit: Payer: Medicare Other | Admitting: Physical Therapy

## 2015-02-06 DIAGNOSIS — M25612 Stiffness of left shoulder, not elsewhere classified: Secondary | ICD-10-CM | POA: Diagnosis not present

## 2015-02-06 DIAGNOSIS — M25512 Pain in left shoulder: Secondary | ICD-10-CM

## 2015-02-06 NOTE — Therapy (Addendum)
[facility] [place] [address] [place], [place], [address] Phone: [phone]   Fax:  [phone]  Physical Therapy Treatment  Patient Details  Name: Linda Mcgrath MRN: [id number] Date of Birth: [birth year] Referring Provider:  Gypsy Lore, MD  Encounter Date: 02/06/2015      PT End of Session - 02/06/15 1006    Visit Number 3   Date for PT Re-Evaluation 04/01/15   PT Start Time 0930   PT Stop Time 1025   PT Time Calculation (min) 55 min   Activity Tolerance Patient tolerated treatment well   Behavior During Therapy [facility] for tasks assessed/performed      Past Medical History  Diagnosis Date  . Osteoarthritis   . Fibromyalgia   . Hypertension   . Hypothyroidism   . Hyperlipidemia   . Internal hemorrhoids   . Colon polyp 1999    small polyp  . Diverticulosis   . Lung nodule     stable LUL 9 mm  . Cataract     Bil/lens implant  . Allergic rhinitis   . Leg cramps   . Menopausal syndrome   . UTI (urinary tract infection)     Past Surgical History  Procedure Laterality Date  . Abdominal hysterectomy  1975    total-fibroids  . Rotator cuff repair      x 2 /right shoulder  . Skin cancer excision      pre-melanoma / on face  . Breast biopsy      benign  . Cataract extraction      bilateral  . Abd u/s  11/1998    negative  . Abd u/s  01/2001    gallbladder polyps  . Hida scan  01/2001    Negative  . Colonoscopy  1998    Diverticulosis; polyp\  . Dexa  10/2001    osteopenia  . Esophagogastroduodenoscopy  2004  . Colonoscopy  09/2002    Diverticulosis, hem  . Colonoscopy  12/2007    diverticulosis, polyp  . Appendectomy  1975  . Cholecystectomy    . Knee surgery      right knee / 11/2004    There were no vitals filed for this visit.  Visit Diagnosis:  Left shoulder pain  Shoulder stiffness, left      Subjective Assessment - 02/06/15 0933    Subjective Feels a little better;  feels like IFC/HP and ionto "definitely helping."   Patient Stated Goals less pain with ADL's   Currently in Pain? Yes   Pain Score 5    Pain Location Shoulder   Pain Orientation Left   Pain Descriptors / Indicators Aching;Sore   Pain Type Chronic pain   Pain Onset More than a month ago   Aggravating Factors  yardwork; sleeping with arm overhead, reaching overhead and behind back   Pain Relieving Factors rest            [facility] PT Assessment - 02/06/15 0958    AROM   Left Shoulder Flexion 135 Degrees   Left Shoulder ABduction 95 Degrees                     [facility] Adult PT Treatment/Exercise - 02/06/15 0935    Shoulder Exercises: Supine   Flexion Left;20 reps;Weights;AAROM  with cane to 90; tried active and 1# pt unable to tolerate   Shoulder Flexion Weight (lbs) cane press   Flexion Limitations x 20   Shoulder Exercises:  Seated   Retraction Strengthening;Both;20 reps;Theraband   Theraband Level (Shoulder Retraction) Level 2 (Red)   Shoulder Exercises: Sidelying   External Rotation Strengthening;Left;20 reps;Weights   External Rotation Weight (lbs) 1   ABduction Strengthening;Left;20 reps;Weights   ABduction Weight (lbs) 1   Shoulder Exercises: ROM/Strengthening   UBE (Upper Arm Bike) Level 1.0 x 6 min; 3 min forward/3 min backward   Modalities   Modalities Electrical Stimulation;Moist Heat;Iontophoresis   Moist Heat Therapy   Number Minutes Moist Heat 15 Minutes   Moist Heat Location Shoulder   Electrical Stimulation   Electrical Stimulation Location L shoulder   Electrical Stimulation Action IFC   Electrical Stimulation Parameters to tolerance x 15 min   Electrical Stimulation Goals Pain   Iontophoresis   Type of Iontophoresis Dexamethasone   Location L shoulder   Dose 17mA*min   Time 4 hr patch                  PT Short Term Goals - 02/06/15 1007    PT SHORT TERM GOAL #1   Title independent with HEP   Status Achieved            PT Long Term Goals - 02/06/15 1007    PT LONG TERM GOAL #1   Title decrease pain 50%   Time 8   Period Weeks   Status On-going   PT LONG TERM GOAL #2   Title increase AROm of the left shoulder to 120 degrees abduction   Time 8   Period Weeks   Status On-going   PT LONG TERM GOAL #3   Title Dress and do hair with minimal difficulty   Time 8   Period Weeks   Status On-going   PT LONG TERM GOAL #4   Title 25% reported less difficulty sleeping   Time 8   Period Weeks   Status On-going               Plan - 02/06/15 1006    Clinical Impression Statement Pt reports overall pain improving.  Using modalities as needed and ionto for pain which pt feels all are helping.  ROM flexion and abduction improved 10 degrees.  Will continue to benefit from PT to maximize function and decrease pain.   PT Frequency 2x / week   PT Duration 8 weeks   PT Treatment/Interventions Electrical Stimulation;Moist Heat;Iontophoresis 4mg /ml Dexamethasone;Ultrasound;Therapeutic exercise;Therapeutic activities;Patient/family education;Manual techniques   PT Next Visit Plan slowly advance exercises.   Add sidelying ER, ?HABD, unilateral tower pulldowns, Cybex row/PD   Consulted and Agree with Plan of Care Patient        Problem List Patient Active Problem List   Diagnosis Date Noted  . Urge incontinence 01/24/2015  . Yeast vaginitis 12/05/2014  . Grief reaction 10/25/2014  . Estrogen deficiency 10/25/2014  . Acute bronchitis with bronchospasm 05/16/2014  . Lichen planus 12/20/2013  . Cough 12/20/2013  . Encounter for Medicare annual wellness exam 06/17/2013  . Joint pain 04/30/2013  . Aphthous ulcer of mouth 03/16/2013  . Tick bite 01/25/2013  . Hyperglycemia 06/03/2011  . Leg pain 05/07/2011  . HYPERTENSION, BENIGN ESSENTIAL 10/23/2010  . CONSTIPATION 10/23/2010  . HEMATOCHEZIA 10/23/2010  . OSTEOARTHRITIS, HANDS, BILATERAL 01/24/2010  . Hypothyroidism 08/24/2008  . Vitamin D deficiency  06/06/2008  . Hyperlipidemia 06/06/2008  . MENOPAUSAL SYNDROME 03/29/2008  . Osteopenia 03/29/2008  . ALLERGIC RHINITIS 01/25/2008  . IBS 01/25/2008  . OSTEOARTHRITIS 01/25/2008  . FIBROMYALGIA 01/25/2008   Clarita Crane,  PT, DPT 02/06/2015 10:25 AM  [facility]- [place] [address] [place], [place], [address] Phone: [phone]   Fax:  [phone]

## 2015-02-07 DIAGNOSIS — M7552 Bursitis of left shoulder: Secondary | ICD-10-CM | POA: Diagnosis not present

## 2015-02-08 ENCOUNTER — Ambulatory Visit: Payer: Medicare Other | Admitting: Physical Therapy

## 2015-02-08 DIAGNOSIS — M25612 Stiffness of left shoulder, not elsewhere classified: Secondary | ICD-10-CM | POA: Diagnosis not present

## 2015-02-08 DIAGNOSIS — M25512 Pain in left shoulder: Secondary | ICD-10-CM | POA: Diagnosis not present

## 2015-02-08 NOTE — Therapy (Signed)
[facility] [place] [address] [facility] [address] [place], [place], [address] Phone: [phone]   Fax:  [phone]  Physical Therapy Treatment  Patient Details  Name: Linda Mcgrath MRN: [id number] Date of Birth: Feb 25, 1933 Referring Provider:  Gypsy Lore, MD  Encounter Date: 02/08/2015      PT End of Session - 02/08/15 1004    Visit Number 4   Date for PT Re-Evaluation 04/01/15   PT Start Time 0930   PT Stop Time 1014   PT Time Calculation (min) 44 min   Activity Tolerance Patient tolerated treatment well   Behavior During Therapy [facility] for tasks assessed/performed      Past Medical History  Diagnosis Date  . Osteoarthritis   . Fibromyalgia   . Hypertension   . Hypothyroidism   . Hyperlipidemia   . Internal hemorrhoids   . Colon polyp 1999    small polyp  . Diverticulosis   . Lung nodule     stable LUL 9 mm  . Cataract     Bil/lens implant  . Allergic rhinitis   . Leg cramps   . Menopausal syndrome   . UTI (urinary tract infection)     Past Surgical History  Procedure Laterality Date  . Abdominal hysterectomy  1975    total-fibroids  . Rotator cuff repair      x 2 /right shoulder  . Skin cancer excision      pre-melanoma / on face  . Breast biopsy      benign  . Cataract extraction      bilateral  . Abd u/s  11/1998    negative  . Abd u/s  01/2001    gallbladder polyps  . Hida scan  01/2001    Negative  . Colonoscopy  1998    Diverticulosis; polyp\  . Dexa  10/2001    osteopenia  . Esophagogastroduodenoscopy  2004  . Colonoscopy  09/2002    Diverticulosis, hem  . Colonoscopy  12/2007    diverticulosis, polyp  . Appendectomy  1975  . Cholecystectomy    . Knee surgery      right knee / 11/2004    There were no vitals filed for this visit.  Visit Diagnosis:  Left shoulder pain  Shoulder stiffness, left      Subjective Assessment - 02/08/15 0933    Subjective "Doesn't hurt too much."   Overall feels like shoulder is improving.  Didn't sleep well last night; not necessarily due to pain.   Patient Stated Goals less pain with ADL's   Currently in Pain? Yes   Pain Score 4    Pain Location Shoulder   Pain Orientation Left   Pain Descriptors / Indicators Aching;Sore   Pain Type Chronic pain   Pain Onset More than a month ago                         [facility] Adult PT Treatment/Exercise - 02/08/15 0935    Shoulder Exercises: Supine   Protraction AAROM;Both;20 reps   Protraction Limitations with cane; mod cues for technique   Horizontal ABduction AAROM;Left;20 reps   Horizontal ABduction Limitations with cane   Flexion AAROM;Left;20 reps  with cane to tolerance ~100 degrees   Shoulder Flexion Weight (lbs) cane press   Flexion Limitations x 20   Shoulder Exercises: Sidelying   External Rotation Strengthening;Left;20 reps;Weights   External Rotation Weight (lbs) 1   ABduction Strengthening;Left;20 reps;Weights  ABduction Weight (lbs) 1   ABduction Limitations to 90 only   Shoulder Exercises: Standing   Retraction Strengthening;Both;20 reps;Theraband   Theraband Level (Shoulder Retraction) Level 2 (Red)   Shoulder Exercises: ROM/Strengthening   UBE (Upper Arm Bike) Level 1.0 x 6 min; 3 min forward/3 min backward   Electrical Stimulation   Electrical Stimulation Location L shoulder   Electrical Stimulation Action IFC   Electrical Stimulation Parameters to tolerance x 15 min   Electrical Stimulation Goals Pain   Iontophoresis   Type of Iontophoresis Dexamethasone   Location L shoulder   Dose 71mA*min   Time 4 hr patch                  PT Short Term Goals - 02/06/15 1007    PT SHORT TERM GOAL #1   Title independent with HEP   Status Achieved           PT Long Term Goals - 02/06/15 1007    PT LONG TERM GOAL #1   Title decrease pain 50%   Time 8   Period Weeks   Status On-going   PT LONG TERM GOAL #2   Title increase AROm of the  left shoulder to 120 degrees abduction   Time 8   Period Weeks   Status On-going   PT LONG TERM GOAL #3   Title Dress and do hair with minimal difficulty   Time 8   Period Weeks   Status On-going   PT LONG TERM GOAL #4   Title 25% reported less difficulty sleeping   Time 8   Period Weeks   Status On-going               Plan - 02/08/15 1004    Clinical Impression Statement Progressing well; continues to have pain with ROM activities.  Will continue to benefit from PT to maximize function.   PT Next Visit Plan slowly advance exercises.   Add sidelying ER, ?HABD, unilateral tower pulldowns, Cybex row/PD   Consulted and Agree with Plan of Care Patient        Problem List Patient Active Problem List   Diagnosis Date Noted  . Urge incontinence 01/24/2015  . Yeast vaginitis 12/05/2014  . Grief reaction 10/25/2014  . Estrogen deficiency 10/25/2014  . Acute bronchitis with bronchospasm 05/16/2014  . Lichen planus 12/20/2013  . Cough 12/20/2013  . Encounter for Medicare annual wellness exam 06/17/2013  . Joint pain 04/30/2013  . Aphthous ulcer of mouth 03/16/2013  . Tick bite 01/25/2013  . Hyperglycemia 06/03/2011  . Leg pain 05/07/2011  . HYPERTENSION, BENIGN ESSENTIAL 10/23/2010  . CONSTIPATION 10/23/2010  . HEMATOCHEZIA 10/23/2010  . OSTEOARTHRITIS, HANDS, BILATERAL 01/24/2010  . Hypothyroidism 08/24/2008  . Vitamin D deficiency 06/06/2008  . Hyperlipidemia 06/06/2008  . MENOPAUSAL SYNDROME 03/29/2008  . Osteopenia 03/29/2008  . ALLERGIC RHINITIS 01/25/2008  . IBS 01/25/2008  . OSTEOARTHRITIS 01/25/2008  . FIBROMYALGIA 01/25/2008   Clarita Crane, PT, DPT 02/08/2015 10:58 AM  [facility]- [address] [place] [address] [facility] [address] [place], [place], [address] Phone: [phone]   Fax:  [phone]

## 2015-02-13 ENCOUNTER — Ambulatory Visit: Payer: Medicare Other | Admitting: Physical Therapy

## 2015-02-13 ENCOUNTER — Encounter: Payer: Self-pay | Admitting: Physical Therapy

## 2015-02-13 DIAGNOSIS — M25612 Stiffness of left shoulder, not elsewhere classified: Secondary | ICD-10-CM | POA: Diagnosis not present

## 2015-02-13 DIAGNOSIS — M25512 Pain in left shoulder: Secondary | ICD-10-CM

## 2015-02-13 NOTE — Therapy (Signed)
[facility] [place] [address] [facility] [address] [place], [place], [address] Phone: [phone]   Fax:  [phone]  Physical Therapy Treatment  Patient Details  Name: Linda Mcgrath MRN: [id number] Date of Birth: August 30, 1932 Referring Provider:  Gypsy Lore, MD  Encounter Date: 02/13/2015      PT End of Session - 02/13/15 1004    Visit Number 5   PT Start Time 0925   PT Stop Time 1030   PT Time Calculation (min) 65 min   Activity Tolerance Patient tolerated treatment well   Behavior During Therapy [facility] for tasks assessed/performed      Past Medical History  Diagnosis Date  . Osteoarthritis   . Fibromyalgia   . Hypertension   . Hypothyroidism   . Hyperlipidemia   . Internal hemorrhoids   . Colon polyp 1999    small polyp  . Diverticulosis   . Lung nodule     stable LUL 9 mm  . Cataract     Bil/lens implant  . Allergic rhinitis   . Leg cramps   . Menopausal syndrome   . UTI (urinary tract infection)     Past Surgical History  Procedure Laterality Date  . Abdominal hysterectomy  1975    total-fibroids  . Rotator cuff repair      x 2 /right shoulder  . Skin cancer excision      pre-melanoma / on face  . Breast biopsy      benign  . Cataract extraction      bilateral  . Abd u/s  11/1998    negative  . Abd u/s  01/2001    gallbladder polyps  . Hida scan  01/2001    Negative  . Colonoscopy  1998    Diverticulosis; polyp\  . Dexa  10/2001    osteopenia  . Esophagogastroduodenoscopy  2004  . Colonoscopy  09/2002    Diverticulosis, hem  . Colonoscopy  12/2007    diverticulosis, polyp  . Appendectomy  1975  . Cholecystectomy    . Knee surgery      right knee / 11/2004    There were no vitals filed for this visit.  Visit Diagnosis:  Left shoulder pain  Shoulder stiffness, left      Subjective Assessment - 02/13/15 0946    Subjective patient reports a fall on Friday, reports that her trying to get  up from floor caused her ot ahve increased pain in the anterior left shoulder.   Currently in Pain? Yes   Pain Score 4   reports some increased soreness up to 8/10 over the weekend   Pain Location Shoulder   Pain Orientation Left;Anterior   Pain Descriptors / Indicators Aching;Sore   Aggravating Factors  trying to get up from floor increased pain   Effect of Pain on Daily Activities rest                         [facility] Adult PT Treatment/Exercise - 02/13/15 0001    Shoulder Exercises: Seated   Retraction Strengthening;Both;20 reps;Theraband   Theraband Level (Shoulder Retraction) Level 2 (Red)   Row Strengthening;20 reps;Theraband   Theraband Level (Shoulder Row) Level 2 (Red)   Other Seated Exercises bent over row 3#, biceps 3#   Other Seated Exercises seated row 15#, lats 15#   Shoulder Exercises: Sidelying   External Rotation Strengthening;Left;20 reps;Weights   Theraband Level (Shoulder External Rotation) Level 2 (Red)  Shoulder Exercises: Standing   Theraband Level (Shoulder Extension) Level 1 (Yellow)   Theraband Level (Shoulder Row) Level 2 (Red)   Shoulder Exercises: ROM/Strengthening   Wall Pushups 10 reps   "W" Arms 10   Electrical Stimulation   Electrical Stimulation Location L shoulder   Electrical Stimulation Action IFC   Electrical Stimulation Parameters tolerance   Electrical Stimulation Goals Pain   Iontophoresis   Type of Iontophoresis Dexamethasone   Location L shoulder   Dose 41mA*min   Time 4 hr patch                  PT Short Term Goals - 02/06/15 1007    PT SHORT TERM GOAL #1   Title independent with HEP   Status Achieved           PT Long Term Goals - 02/06/15 1007    PT LONG TERM GOAL #1   Title decrease pain 50%   Time 8   Period Weeks   Status On-going   PT LONG TERM GOAL #2   Title increase AROm of the left shoulder to 120 degrees abduction   Time 8   Period Weeks   Status On-going   PT LONG TERM GOAL  #3   Title Dress and do hair with minimal difficulty   Time 8   Period Weeks   Status On-going   PT LONG TERM GOAL #4   Title 25% reported less difficulty sleeping   Time 8   Period Weeks   Status On-going               Plan - 02/13/15 1008    Clinical Impression Statement Had a fall this weekend and trying to get up from sitting she caused her shoulder to hurt.  Has flared them up per her.  Has a little less ROM and increase pain with motions   PT Next Visit Plan slowly advance exercises.   Add sidelying ER, ?HABD, unilateral tower pulldowns, Cybex row/PD   Consulted and Agree with Plan of Care --        Problem List Patient Active Problem List   Diagnosis Date Noted  . Urge incontinence 01/24/2015  . Yeast vaginitis 12/05/2014  . Grief reaction 10/25/2014  . Estrogen deficiency 10/25/2014  . Acute bronchitis with bronchospasm 05/16/2014  . Lichen planus 12/20/2013  . Cough 12/20/2013  . Encounter for Medicare annual wellness exam 06/17/2013  . Joint pain 04/30/2013  . Aphthous ulcer of mouth 03/16/2013  . Tick bite 01/25/2013  . Hyperglycemia 06/03/2011  . Leg pain 05/07/2011  . HYPERTENSION, BENIGN ESSENTIAL 10/23/2010  . CONSTIPATION 10/23/2010  . HEMATOCHEZIA 10/23/2010  . OSTEOARTHRITIS, HANDS, BILATERAL 01/24/2010  . Hypothyroidism 08/24/2008  . Vitamin D deficiency 06/06/2008  . Hyperlipidemia 06/06/2008  . MENOPAUSAL SYNDROME 03/29/2008  . Osteopenia 03/29/2008  . ALLERGIC RHINITIS 01/25/2008  . IBS 01/25/2008  . OSTEOARTHRITIS 01/25/2008  . FIBROMYALGIA 01/25/2008    Jearld Lesch., PT 02/13/2015, 11:04 AM  [facility] [place]- [address] [place] [address] [facility] [address] [place], [place], [address] Phone: [phone]   Fax:  [phone]

## 2015-02-15 ENCOUNTER — Ambulatory Visit: Payer: Medicare Other | Admitting: Physical Therapy

## 2015-02-15 ENCOUNTER — Encounter: Payer: Self-pay | Admitting: Physical Therapy

## 2015-02-15 ENCOUNTER — Encounter: Payer: Medicare Other | Admitting: Physical Therapy

## 2015-02-15 DIAGNOSIS — M25512 Pain in left shoulder: Secondary | ICD-10-CM | POA: Diagnosis not present

## 2015-02-15 DIAGNOSIS — M25612 Stiffness of left shoulder, not elsewhere classified: Secondary | ICD-10-CM | POA: Diagnosis not present

## 2015-02-15 NOTE — Therapy (Signed)
[facility] [place] [address] [facility] [address] [place], [place], [address] Phone: [phone]   Fax:  [phone]  Physical Therapy Treatment  Patient Details  Name: Linda Mcgrath MRN: [id number] Date of Birth: November 10, 1932 Referring Provider:  Gypsy Lore, MD  Encounter Date: 02/15/2015      PT End of Session - 02/15/15 1054    Visit Number 6   Date for PT Re-Evaluation 04/01/15   PT Start Time 0952   PT Stop Time 1050   PT Time Calculation (min) 58 min   Activity Tolerance Patient tolerated treatment well   Behavior During Therapy [facility] for tasks assessed/performed      Past Medical History  Diagnosis Date  . Osteoarthritis   . Fibromyalgia   . Hypertension   . Hypothyroidism   . Hyperlipidemia   . Internal hemorrhoids   . Colon polyp 1999    small polyp  . Diverticulosis   . Lung nodule     stable LUL 9 mm  . Cataract     Bil/lens implant  . Allergic rhinitis   . Leg cramps   . Menopausal syndrome   . UTI (urinary tract infection)     Past Surgical History  Procedure Laterality Date  . Abdominal hysterectomy  1975    total-fibroids  . Rotator cuff repair      x 2 /right shoulder  . Skin cancer excision      pre-melanoma / on face  . Breast biopsy      benign  . Cataract extraction      bilateral  . Abd u/s  11/1998    negative  . Abd u/s  01/2001    gallbladder polyps  . Hida scan  01/2001    Negative  . Colonoscopy  1998    Diverticulosis; polyp\  . Dexa  10/2001    osteopenia  . Esophagogastroduodenoscopy  2004  . Colonoscopy  09/2002    Diverticulosis, hem  . Colonoscopy  12/2007    diverticulosis, polyp  . Appendectomy  1975  . Cholecystectomy    . Knee surgery      right knee / 11/2004    There were no vitals filed for this visit.  Visit Diagnosis:  Left shoulder pain  Shoulder stiffness, left      Subjective Assessment - 02/15/15 0959    Subjective My neck is really hurting.   Has c/o pain and spasms in the upper traps mostly on the right   Currently in Pain? Yes   Pain Score 4    Pain Location Shoulder   Pain Orientation Left                         [facility] Adult PT Treatment/Exercise - 02/15/15 0001    Shoulder Exercises: Seated   Retraction Strengthening;Both;20 reps;Theraband   Theraband Level (Shoulder Retraction) Level 2 (Red)   Row Strengthening;20 reps;Theraband   Theraband Level (Shoulder Row) Level 2 (Red)   Other Seated Exercises bent over row 3#, biceps 3#   Other Seated Exercises seated row 15#, lats 15#   Shoulder Exercises: ROM/Strengthening   "W" Arms 10   Other ROM/Strengthening Exercises shoulder shrugs and shoulder elevation   Shoulder Exercises: Stretch   Corner Stretch 2 reps;10 seconds   Cross Chest Stretch 2 reps;10 seconds   Electrical Stimulation   Electrical Stimulation Location L shoulder   Electrical Stimulation Action IFC   Electrical Stimulation Parameters  tolerance   Electrical Stimulation Goals Pain   Iontophoresis   Type of Iontophoresis Dexamethasone   Location L shoulder   Dose 66mA*min   Time 4 hr patch   Manual Therapy   Manual Therapy Myofascial release   Manual therapy comments bilateral upper traps and into the neck area                  PT Short Term Goals - 02/06/15 1007    PT SHORT TERM GOAL #1   Title independent with HEP   Status Achieved           PT Long Term Goals - 02/06/15 1007    PT LONG TERM GOAL #1   Title decrease pain 50%   Time 8   Period Weeks   Status On-going   PT LONG TERM GOAL #2   Title increase AROm of the left shoulder to 120 degrees abduction   Time 8   Period Weeks   Status On-going   PT LONG TERM GOAL #3   Title Dress and do hair with minimal difficulty   Time 8   Period Weeks   Status On-going   PT LONG TERM GOAL #4   Title 25% reported less difficulty sleeping   Time 8   Period Weeks   Status On-going                Plan - 02/15/15 1054    Clinical Impression Statement Has tightness with spasms in the upper traps, reports that the shoulder is doing better.   PT Next Visit Plan see if the spasms decreased   Consulted and Agree with Plan of Care Patient        Problem List Patient Active Problem List   Diagnosis Date Noted  . Urge incontinence 01/24/2015  . Yeast vaginitis 12/05/2014  . Grief reaction 10/25/2014  . Estrogen deficiency 10/25/2014  . Acute bronchitis with bronchospasm 05/16/2014  . Lichen planus 12/20/2013  . Cough 12/20/2013  . Encounter for Medicare annual wellness exam 06/17/2013  . Joint pain 04/30/2013  . Aphthous ulcer of mouth 03/16/2013  . Tick bite 01/25/2013  . Hyperglycemia 06/03/2011  . Leg pain 05/07/2011  . HYPERTENSION, BENIGN ESSENTIAL 10/23/2010  . CONSTIPATION 10/23/2010  . HEMATOCHEZIA 10/23/2010  . OSTEOARTHRITIS, HANDS, BILATERAL 01/24/2010  . Hypothyroidism 08/24/2008  . Vitamin D deficiency 06/06/2008  . Hyperlipidemia 06/06/2008  . MENOPAUSAL SYNDROME 03/29/2008  . Osteopenia 03/29/2008  . ALLERGIC RHINITIS 01/25/2008  . IBS 01/25/2008  . OSTEOARTHRITIS 01/25/2008  . FIBROMYALGIA 01/25/2008    Jearld Lesch., PT 02/15/2015, 11:01 AM  [facility] [place] [address] [facility] [address] [place], [place], [address] Phone: [phone]   Fax:  [phone]

## 2015-02-16 ENCOUNTER — Other Ambulatory Visit: Payer: Self-pay | Admitting: Family Medicine

## 2015-02-20 ENCOUNTER — Ambulatory Visit: Payer: Medicare Other | Admitting: Physical Therapy

## 2015-02-20 ENCOUNTER — Encounter: Payer: Self-pay | Admitting: Physical Therapy

## 2015-02-20 DIAGNOSIS — M25612 Stiffness of left shoulder, not elsewhere classified: Secondary | ICD-10-CM

## 2015-02-20 DIAGNOSIS — M25512 Pain in left shoulder: Secondary | ICD-10-CM | POA: Diagnosis not present

## 2015-02-20 NOTE — Therapy (Signed)
[facility] [place] [address] [facility] [address] [place], [place], [address] Phone: [phone]   Fax:  [phone]  Physical Therapy Treatment  Patient Details  Name: Linda Mcgrath MRN: [id number] Date of Birth: Nov 11, 1932 Referring Provider:  Gypsy Lore, MD  Encounter Date: 02/20/2015      PT End of Session - 02/20/15 1031    Visit Number 7   Date for PT Re-Evaluation 04/01/15   PT Start Time 0930   PT Stop Time 1030   PT Time Calculation (min) 60 min   Activity Tolerance Patient tolerated treatment well      Past Medical History  Diagnosis Date  . Osteoarthritis   . Fibromyalgia   . Hypertension   . Hypothyroidism   . Hyperlipidemia   . Internal hemorrhoids   . Colon polyp 1999    small polyp  . Diverticulosis   . Lung nodule     stable LUL 9 mm  . Cataract     Bil/lens implant  . Allergic rhinitis   . Leg cramps   . Menopausal syndrome   . UTI (urinary tract infection)     Past Surgical History  Procedure Laterality Date  . Abdominal hysterectomy  1975    total-fibroids  . Rotator cuff repair      x 2 /right shoulder  . Skin cancer excision      pre-melanoma / on face  . Breast biopsy      benign  . Cataract extraction      bilateral  . Abd u/s  11/1998    negative  . Abd u/s  01/2001    gallbladder polyps  . Hida scan  01/2001    Negative  . Colonoscopy  1998    Diverticulosis; polyp\  . Dexa  10/2001    osteopenia  . Esophagogastroduodenoscopy  2004  . Colonoscopy  09/2002    Diverticulosis, hem  . Colonoscopy  12/2007    diverticulosis, polyp  . Appendectomy  1975  . Cholecystectomy    . Knee surgery      right knee / 11/2004    There were no vitals filed for this visit.  Visit Diagnosis:  Left shoulder pain  Shoulder stiffness, left      Subjective Assessment - 02/20/15 0933    Subjective Pt states she is feeling ok.  Should is a little better but still bothers her  especially in the morning when she gets up, in the front part.  Feels better after showering and having her coffee.  The previous tx of heat on both shoulers/trap area really helped   Currently in Pain? No/denies   Aggravating Factors  doing hair, reaching behind her back-left arm   Pain Relieving Factors HEP, warm shower                         [facility] Adult PT Treatment/Exercise - 02/20/15 0001    Shoulder Exercises: Seated   Retraction Strengthening;Both;20 reps;Theraband   Theraband Level (Shoulder Retraction) Level 2 (Red)   Row Strengthening;20 reps;Theraband   Theraband Level (Shoulder Row) Level 2 (Red)   Other Seated Exercises bent over row 3#, biceps 3#, bent over ext 1#   Other Seated Exercises Seated row 20#, lats #15   Shoulder Exercises: ROM/Strengthening   "W" Arms 10   Shoulder Exercises: Stretch   Corner Stretch 2 reps;10 seconds   Electrical Stimulation   Electrical Stimulation Location L shoulder  Electrical Stimulation Action IFC   Electrical Stimulation Parameters tolerance   Electrical Stimulation Goals Pain   Iontophoresis   Type of Iontophoresis Dexamethasone   Location L shoulder   Dose 17mA*min   Time 4 hr patch   Manual Therapy   Manual Therapy Myofascial release   Manual therapy comments bilateral upper traps and into the neck area                PT Education - 02/20/15 1102    Education provided Yes   Education Details PT went over posture and biomechanics for gardening   Person(s) Educated Patient   Methods Explanation;Demonstration   Comprehension Verbalized understanding          PT Short Term Goals - 02/06/15 1007    PT SHORT TERM GOAL #1   Title independent with HEP   Status Achieved           PT Long Term Goals - 02/06/15 1007    PT LONG TERM GOAL #1   Title decrease pain 50%   Time 8   Period Weeks   Status On-going   PT LONG TERM GOAL #2   Title increase AROm of the left shoulder to 120 degrees  abduction   Time 8   Period Weeks   Status On-going   PT LONG TERM GOAL #3   Title Dress and do hair with minimal difficulty   Time 8   Period Weeks   Status On-going   PT LONG TERM GOAL #4   Title 25% reported less difficulty sleeping   Time 8   Period Weeks   Status On-going               Plan - 02/20/15 1103    Clinical Impression Statement tighness and spasms remain but pt reports her shoulder is continuing to improve and feels better.  Pt states the exercises are really helping.     Pt will benefit from skilled therapeutic intervention in order to improve on the following deficits Decreased range of motion;Decreased strength;Increased muscle spasms;Impaired UE functional use;Pain   Rehab Potential Good   PT Frequency 2x / week   PT Duration 8 weeks   PT Treatment/Interventions Electrical Stimulation;Moist Heat;Iontophoresis 4mg /ml Dexamethasone;Ultrasound;Therapeutic exercise;Therapeutic activities;Patient/family education;Manual techniques   PT Next Visit Plan see if the spasms and pain decrease as well as increase in ease of ADL's        Problem List Patient Active Problem List   Diagnosis Date Noted  . Urge incontinence 01/24/2015  . Yeast vaginitis 12/05/2014  . Grief reaction 10/25/2014  . Estrogen deficiency 10/25/2014  . Acute bronchitis with bronchospasm 05/16/2014  . Lichen planus 12/20/2013  . Cough 12/20/2013  . Encounter for Medicare annual wellness exam 06/17/2013  . Joint pain 04/30/2013  . Aphthous ulcer of mouth 03/16/2013  . Tick bite 01/25/2013  . Hyperglycemia 06/03/2011  . Leg pain 05/07/2011  . HYPERTENSION, BENIGN ESSENTIAL 10/23/2010  . CONSTIPATION 10/23/2010  . HEMATOCHEZIA 10/23/2010  . OSTEOARTHRITIS, HANDS, BILATERAL 01/24/2010  . Hypothyroidism 08/24/2008  . Vitamin D deficiency 06/06/2008  . Hyperlipidemia 06/06/2008  . MENOPAUSAL SYNDROME 03/29/2008  . Osteopenia 03/29/2008  . ALLERGIC RHINITIS 01/25/2008  . IBS  01/25/2008  . OSTEOARTHRITIS 01/25/2008  . FIBROMYALGIA 01/25/2008    Jearld Lesch., PT 02/20/2015, 11:47 AM  [facility]- [address] [place] [address] [facility] [address] [place], [place], [address] Phone: [phone]   Fax:  [phone]

## 2015-02-22 ENCOUNTER — Ambulatory Visit: Payer: Medicare Other | Admitting: Rehabilitation

## 2015-02-22 DIAGNOSIS — M25512 Pain in left shoulder: Secondary | ICD-10-CM | POA: Diagnosis not present

## 2015-02-22 DIAGNOSIS — M25612 Stiffness of left shoulder, not elsewhere classified: Secondary | ICD-10-CM | POA: Diagnosis not present

## 2015-02-22 NOTE — Therapy (Signed)
[facility] [place] [address] [place], [place], [address] Phone: [phone]   Fax:  [phone]  Physical Therapy Treatment  Patient Details  Name: Linda Mcgrath MRN: [id number] Date of Birth: 15-May-1933 Referring Provider:  Gypsy Lore, MD  Encounter Date: 02/22/2015      PT End of Session - 02/22/15 0929    Visit Number 8   PT Start Time 5638   PT Stop Time 1022   PT Time Calculation (min) 56 min      Past Medical History  Diagnosis Date  . Osteoarthritis   . Fibromyalgia   . Hypertension   . Hypothyroidism   . Hyperlipidemia   . Internal hemorrhoids   . Colon polyp 1999    small polyp  . Diverticulosis   . Lung nodule     stable LUL 9 mm  . Cataract     Bil/lens implant  . Allergic rhinitis   . Leg cramps   . Menopausal syndrome   . UTI (urinary tract infection)     Past Surgical History  Procedure Laterality Date  . Abdominal hysterectomy  1975    total-fibroids  . Rotator cuff repair      x 2 /right shoulder  . Skin cancer excision      pre-melanoma / on face  . Breast biopsy      benign  . Cataract extraction      bilateral  . Abd u/s  11/1998    negative  . Abd u/s  01/2001    gallbladder polyps  . Hida scan  01/2001    Negative  . Colonoscopy  1998    Diverticulosis; polyp\  . Dexa  10/2001    osteopenia  . Esophagogastroduodenoscopy  2004  . Colonoscopy  09/2002    Diverticulosis, hem  . Colonoscopy  12/2007    diverticulosis, polyp  . Appendectomy  1975  . Cholecystectomy    . Knee surgery      right knee / 11/2004    There were no vitals filed for this visit.  Visit Diagnosis:  Left shoulder pain      Subjective Assessment - 02/22/15 0930    Subjective States she was sore after last PT treatment, but is not having much pain today.    States yesterday was a bad day, but overall doing better.   Currently in Pain? No/denies   Pain Score 0-No pain   Pain  Location Shoulder   Pain Orientation Left   Pain Descriptors / Indicators Aching;Sore   Pain Type Chronic pain   Pain Onset More than a month ago                         [facility] Adult PT Treatment/Exercise - 02/22/15 0001    Shoulder Exercises: Seated   Retraction Strengthening   Theraband Level (Shoulder Retraction) Level 2 (Red)   Row Strengthening   Theraband Level (Shoulder Row) Level 2 (Red)   Theraband Level (Shoulder External Rotation) Level 1 (Yellow)   External Rotation Limitations 20   Other Seated Exercises bent over row 3#, bicep 1#, bent over ext 3# x 30   Other Seated Exercises seated row, Lat PD 20# x 20   Shoulder Exercises: ROM/Strengthening   UBE (Upper Arm Bike) L2 x 6' back   "W" Arms 20   Shoulder Exercises: Higher education careers adviser Limitations 60  sec   Moist Heat Therapy   Number Minutes Moist Heat 15 Minutes   Moist Heat Location Shoulder   Electrical Stimulation   Electrical Stimulation Location L shoulder   Electrical Stimulation Action IFC   Electrical Stimulation Parameters to tolerance   Electrical Stimulation Goals Pain                PT Education - 02/22/15 1008    Education provided Yes   Education Details shoulder ER   Person(s) Educated Patient   Methods Tactile cues;Verbal cues   Comprehension Tactile cues required;Need further instruction;Verbal cues required          PT Short Term Goals - 02/06/15 1007    PT SHORT TERM GOAL #1   Title independent with HEP   Status Achieved           PT Long Term Goals - 02/06/15 1007    PT LONG TERM GOAL #1   Title decrease pain 50%   Time 8   Period Weeks   Status On-going   PT LONG TERM GOAL #2   Title increase AROm of the left shoulder to 120 degrees abduction   Time 8   Period Weeks   Status On-going   PT LONG TERM GOAL #3   Title Dress and do hair with minimal difficulty   Time 8   Period Weeks   Status On-going   PT LONG TERM  GOAL #4   Title 25% reported less difficulty sleeping   Time 8   Period Weeks   Status On-going               Plan - 02/22/15 1012    Clinical Impression Statement continues to progress.    Is sore after treatment and into the next day, but seems to recover by day 2 after therapy.   Still needs cueing for proper form on dumbbell exercises, W's, and corner stretches.        Problem List Patient Active Problem List   Diagnosis Date Noted  . Urge incontinence 01/24/2015  . Yeast vaginitis 12/05/2014  . Grief reaction 10/25/2014  . Estrogen deficiency 10/25/2014  . Acute bronchitis with bronchospasm 05/16/2014  . Lichen planus 12/20/2013  . Cough 12/20/2013  . Encounter for Medicare annual wellness exam 06/17/2013  . Joint pain 04/30/2013  . Aphthous ulcer of mouth 03/16/2013  . Tick bite 01/25/2013  . Hyperglycemia 06/03/2011  . Leg pain 05/07/2011  . HYPERTENSION, BENIGN ESSENTIAL 10/23/2010  . CONSTIPATION 10/23/2010  . HEMATOCHEZIA 10/23/2010  . OSTEOARTHRITIS, HANDS, BILATERAL 01/24/2010  . Hypothyroidism 08/24/2008  . Vitamin D deficiency 06/06/2008  . Hyperlipidemia 06/06/2008  . MENOPAUSAL SYNDROME 03/29/2008  . Osteopenia 03/29/2008  . ALLERGIC RHINITIS 01/25/2008  . IBS 01/25/2008  . OSTEOARTHRITIS 01/25/2008  . FIBROMYALGIA 01/25/2008    Nada Godley, PT 02/22/2015, 10:13 AM  [facility] [place] [address] [place], [place], [address] Phone: [phone]   Fax:  [phone]

## 2015-03-01 ENCOUNTER — Ambulatory Visit: Payer: Medicare Other | Attending: Orthopaedic Surgery | Admitting: Rehabilitation

## 2015-03-01 DIAGNOSIS — M25612 Stiffness of left shoulder, not elsewhere classified: Secondary | ICD-10-CM | POA: Insufficient documentation

## 2015-03-01 DIAGNOSIS — M25512 Pain in left shoulder: Secondary | ICD-10-CM | POA: Diagnosis not present

## 2015-03-01 NOTE — Therapy (Signed)
[facility] [place] [address] [place], [place], [address] Phone: [phone]   Fax:  [phone]  Physical Therapy Treatment  Patient Details  Name: Linda Mcgrath MRN: [id number] Date of Birth: May 11, 1933 Referring Provider:  Gypsy Lore, MD  Encounter Date: 03/01/2015      PT End of Session - 03/01/15 0936    Visit Number 9   PT Start Time 0930   PT Stop Time 1025   PT Time Calculation (min) 55 min      Past Medical History  Diagnosis Date  . Osteoarthritis   . Fibromyalgia   . Hypertension   . Hypothyroidism   . Hyperlipidemia   . Internal hemorrhoids   . Colon polyp 1999    small polyp  . Diverticulosis   . Lung nodule     stable LUL 9 mm  . Cataract     Bil/lens implant  . Allergic rhinitis   . Leg cramps   . Menopausal syndrome   . UTI (urinary tract infection)     Past Surgical History  Procedure Laterality Date  . Abdominal hysterectomy  1975    total-fibroids  . Rotator cuff repair      x 2 /right shoulder  . Skin cancer excision      pre-melanoma / on face  . Breast biopsy      benign  . Cataract extraction      bilateral  . Abd u/s  11/1998    negative  . Abd u/s  01/2001    gallbladder polyps  . Hida scan  01/2001    Negative  . Colonoscopy  1998    Diverticulosis; polyp\  . Dexa  10/2001    osteopenia  . Esophagogastroduodenoscopy  2004  . Colonoscopy  09/2002    Diverticulosis, hem  . Colonoscopy  12/2007    diverticulosis, polyp  . Appendectomy  1975  . Cholecystectomy    . Knee surgery      right knee / 11/2004    There were no vitals filed for this visit.  Visit Diagnosis:  Shoulder stiffness, left  Left shoulder pain      Subjective Assessment - 03/01/15 0938    Subjective States feels about the same.    Still with constant variable discomfort in L lateral shoulder and upper arm.   Heat helps.     Currently in Pain? Yes   Pain Score 6    Pain  Location Shoulder   Pain Orientation Left   Pain Descriptors / Indicators Aching;Sore   Aggravating Factors  ADL's, lifting LUE   Pain Relieving Factors heating pad   Effect of Pain on Daily Activities all are limited                         [facility] Adult PT Treatment/Exercise - 03/01/15 0001    Shoulder Exercises: Seated   Theraband Level (Shoulder Retraction) Level 3 (Green)   Retraction Limitations 30   Theraband Level (Shoulder Row) Level 2 (Red)   Row Limitations 30   Theraband Level (Shoulder External Rotation) Level 1 (Yellow)   External Rotation Limitations 20   Other Seated Exercises bent over rows and extension 3# x 30   Other Seated Exercises seated row,, Lat PD 20# x 3/10   Shoulder Exercises: Pulleys   Other Pulley Exercises seated pulldown 5# x 30   Other Pulley Exercises rowing 10# x 30  Shoulder Exercises: ROM/Strengthening   UBE (Upper Arm Bike) 6' B L2   "W" Arms 20   Other ROM/Strengthening Exercises wall slides x 20, BUE HABD and extension x 20 at wall   Other ROM/Strengthening Exercises corner stretch x 1'   Shoulder Exercises: Higher education careers adviser Limitations 60 sec   Moist Heat Therapy   Number Minutes Moist Heat 15 Minutes   Moist Heat Location Shoulder   Electrical Stimulation   Electrical Stimulation Location L shoulder   Electrical Stimulation Action IFC   Electrical Stimulation Parameters to tolerance   Electrical Stimulation Goals Pain                  PT Short Term Goals - 02/06/15 1007    PT SHORT TERM GOAL #1   Title independent with HEP   Status Achieved           PT Long Term Goals - 02/06/15 1007    PT LONG TERM GOAL #1   Title decrease pain 50%   Time 8   Period Weeks   Status On-going   PT LONG TERM GOAL #2   Title increase AROm of the left shoulder to 120 degrees abduction   Time 8   Period Weeks   Status On-going   PT LONG TERM GOAL #3   Title Dress and do hair  with minimal difficulty   Time 8   Period Weeks   Status On-going   PT LONG TERM GOAL #4   Title 25% reported less difficulty sleeping   Time 8   Period Weeks   Status On-going               Plan - 03/01/15 1005    Clinical Impression Statement Pt. having more pain and c/o intermittent tingling/paresthesia along lateral arm/dorsal forearm.    However, her strength and ability to do her exercise regimen are improving.   Still needs instruction to achieve 100% proper completion and form.    She admits not doing HEP like she should so continue to reinforce importance of this. Her posterior shoulder musculature are quite atrophied.   PT Next Visit Plan continue to work to improve posterior shoulder strength and stability.   Still needs review of all exercises for correct performance        Problem List Patient Active Problem List   Diagnosis Date Noted  . Urge incontinence 01/24/2015  . Yeast vaginitis 12/05/2014  . Grief reaction 10/25/2014  . Estrogen deficiency 10/25/2014  . Acute bronchitis with bronchospasm 05/16/2014  . Lichen planus 12/20/2013  . Cough 12/20/2013  . Encounter for Medicare annual wellness exam 06/17/2013  . Joint pain 04/30/2013  . Aphthous ulcer of mouth 03/16/2013  . Tick bite 01/25/2013  . Hyperglycemia 06/03/2011  . Leg pain 05/07/2011  . HYPERTENSION, BENIGN ESSENTIAL 10/23/2010  . CONSTIPATION 10/23/2010  . HEMATOCHEZIA 10/23/2010  . OSTEOARTHRITIS, HANDS, BILATERAL 01/24/2010  . Hypothyroidism 08/24/2008  . Vitamin D deficiency 06/06/2008  . Hyperlipidemia 06/06/2008  . MENOPAUSAL SYNDROME 03/29/2008  . Osteopenia 03/29/2008  . ALLERGIC RHINITIS 01/25/2008  . IBS 01/25/2008  . OSTEOARTHRITIS 01/25/2008  . FIBROMYALGIA 01/25/2008    Lamar Naef, PT 03/01/2015, 12:50 PM  [facility] [place] [facility] [place] [address] [place], [place], [address] Phone: [phone]   Fax:   [phone]

## 2015-03-06 ENCOUNTER — Encounter: Payer: Self-pay | Admitting: Physical Therapy

## 2015-03-06 ENCOUNTER — Ambulatory Visit: Payer: Medicare Other | Admitting: Physical Therapy

## 2015-03-06 DIAGNOSIS — M25512 Pain in left shoulder: Secondary | ICD-10-CM

## 2015-03-06 DIAGNOSIS — M25612 Stiffness of left shoulder, not elsewhere classified: Secondary | ICD-10-CM

## 2015-03-06 NOTE — Therapy (Signed)
[facility] [place] [address] [place], [place], [address] Phone: [phone]   Fax:  [phone]  Physical Therapy Treatment  Patient Details  Name: Linda Mcgrath MRN: [id number] Date of Birth: 01-23-1933 Referring Provider:  Gypsy Lore, MD  Encounter Date: 03/06/2015      PT End of Session - 03/06/15 1120    Visit Number 10   PT Start Time 0929   PT Stop Time 1031   PT Time Calculation (min) 62 min   Activity Tolerance Patient tolerated treatment well   Behavior During Therapy [facility] for tasks assessed/performed      Past Medical History  Diagnosis Date  . Osteoarthritis   . Fibromyalgia   . Hypertension   . Hypothyroidism   . Hyperlipidemia   . Internal hemorrhoids   . Colon polyp 1999    small polyp  . Diverticulosis   . Lung nodule     stable LUL 9 mm  . Cataract     Bil/lens implant  . Allergic rhinitis   . Leg cramps   . Menopausal syndrome   . UTI (urinary tract infection)     Past Surgical History  Procedure Laterality Date  . Abdominal hysterectomy  1975    total-fibroids  . Rotator cuff repair      x 2 /right shoulder  . Skin cancer excision      pre-melanoma / on face  . Breast biopsy      benign  . Cataract extraction      bilateral  . Abd u/s  11/1998    negative  . Abd u/s  01/2001    gallbladder polyps  . Hida scan  01/2001    Negative  . Colonoscopy  1998    Diverticulosis; polyp\  . Dexa  10/2001    osteopenia  . Esophagogastroduodenoscopy  2004  . Colonoscopy  09/2002    Diverticulosis, hem  . Colonoscopy  12/2007    diverticulosis, polyp  . Appendectomy  1975  . Cholecystectomy    . Knee surgery      right knee / 11/2004    There were no vitals filed for this visit.  Visit Diagnosis:  Shoulder stiffness, left  Left shoulder pain      Subjective Assessment - 03/06/15 1106    Subjective Pain in the anterior left arm/shoulder, reports that she does  better after therapy but then has to do stuff around the home and that will cause pain in the arm.   Currently in Pain? Yes   Pain Score 5    Pain Location Shoulder   Pain Orientation Left   Pain Descriptors / Indicators Aching   Pain Type Chronic pain   Pain Onset More than a month ago   Aggravating Factors  just doing life stuff   Pain Relieving Factors therapy                         [facility] Adult PT Treatment/Exercise - 03/06/15 0001    Shoulder Exercises: Seated   Extension Strengthening   Extension Weight (lbs) 3#   Retraction Strengthening   Theraband Level (Shoulder Retraction) Level 3 (Green)   Retraction Limitations 30   Row Strengthening   Theraband Level (Shoulder Row) Level 2 (Red)   Theraband Level (Shoulder External Rotation) Level 1 (Yellow)   External Rotation Limitations 30   Other Seated Exercises seated row,, Lat PD 20# x 3/10  Shoulder Exercises: ROM/Strengthening   UBE (Upper Arm Bike) 6' B L2   "W" Arms 20   Other ROM/Strengthening Exercises wall slides x 20, BUE HABD and extension x 20 at wall   Other ROM/Strengthening Exercises corner stretch x 1'   Moist Heat Therapy   Number Minutes Moist Heat 15 Minutes   Moist Heat Location Shoulder   Electrical Stimulation   Electrical Stimulation Location L shoulder   Electrical Stimulation Action IFC   Electrical Stimulation Parameters to tolerance   Electrical Stimulation Goals Pain   Manual Therapy   Manual Therapy Passive ROM   Passive ROM PROM left shoulder                  PT Short Term Goals - 02/06/15 1007    PT SHORT TERM GOAL #1   Title independent with HEP   Status Achieved           PT Long Term Goals - 02/06/15 1007    PT LONG TERM GOAL #1   Title decrease pain 50%   Time 8   Period Weeks   Status On-going   PT LONG TERM GOAL #2   Title increase AROm of the left shoulder to 120 degrees abduction   Time 8   Period Weeks   Status On-going   PT LONG TERM  GOAL #3   Title Dress and do hair with minimal difficulty   Time 8   Period Weeks   Status On-going   PT LONG TERM GOAL #4   Title 25% reported less difficulty sleeping   Time 8   Period Weeks   Status On-going                 G-Codes - 2015/03/22 1122    Functional Assessment Tool Used foto   Functional Limitation Other PT primary   Other PT Primary Current Status (Z6109) At least 40 percent but less than 60 percent impaired, limited or restricted   Other PT Primary Goal Status (U0454) At least 20 percent but less than 40 percent impaired, limited or restricted      Problem List Patient Active Problem List   Diagnosis Date Noted  . Urge incontinence 01/24/2015  . Yeast vaginitis 12/05/2014  . Grief reaction 10/25/2014  . Estrogen deficiency 10/25/2014  . Acute bronchitis with bronchospasm 05/16/2014  . Lichen planus 12/20/2013  . Cough 12/20/2013  . Encounter for Medicare annual wellness exam 06/17/2013  . Joint pain 04/30/2013  . Aphthous ulcer of mouth 03/16/2013  . Tick bite 01/25/2013  . Hyperglycemia 06/03/2011  . Leg pain 05/07/2011  . HYPERTENSION, BENIGN ESSENTIAL 10/23/2010  . CONSTIPATION 10/23/2010  . HEMATOCHEZIA 10/23/2010  . OSTEOARTHRITIS, HANDS, BILATERAL 01/24/2010  . Hypothyroidism 08/24/2008  . Vitamin D deficiency 06/06/2008  . Hyperlipidemia 06/06/2008  . MENOPAUSAL SYNDROME 03/29/2008  . Osteopenia 03/29/2008  . ALLERGIC RHINITIS 01/25/2008  . IBS 01/25/2008  . OSTEOARTHRITIS 01/25/2008  . FIBROMYALGIA 01/25/2008    Jearld Lesch., PT 03/22/2015, 11:25 AM  [facility] [address] [place] [address] [place], [place], [address] Phone: [phone]   Fax:  [phone]

## 2015-03-08 ENCOUNTER — Ambulatory Visit: Payer: Medicare Other | Admitting: Physical Therapy

## 2015-03-08 ENCOUNTER — Encounter: Payer: Self-pay | Admitting: Physical Therapy

## 2015-03-08 DIAGNOSIS — M25512 Pain in left shoulder: Secondary | ICD-10-CM

## 2015-03-08 DIAGNOSIS — M25612 Stiffness of left shoulder, not elsewhere classified: Secondary | ICD-10-CM | POA: Diagnosis not present

## 2015-03-08 NOTE — Therapy (Signed)
[place], [place], [address] Phone: [phone]   Fax:  [phone]  March 08, 2015   _0 @  Physical Therapy Discharge Summary  Patient: Linda Mcgrath  MRN: [id number]  Date of Birth: February 22, 1933   Diagnosis: Shoulder stiffness, left  Left shoulder pain Referring Provider:  Drema Pry, MD  The above patient had been seen in Physical Therapy 11 times The patient is: improved  Subjective:  Less pain, better motions, less difficulty with ADL's  Discharge Findings: AROM of the left shoulder 150 degrees flexion, able to dress and do hair without difficulty    Goals were met      Plan - 03/08/15 1050    Clinical Impression Statement Patient feels that she can continue on her own, having less pain.  Understands the importance of her HEP   PT Next Visit Plan Will d/c with her doing her HEP   Consulted and Agree with Plan of Care Patient      Sincerely,   Sumner Boast, PT   CC _1 @  [facility] [place], [place], [address] Phone: [phone]   Fax:  [phone]

## 2015-03-16 ENCOUNTER — Other Ambulatory Visit: Payer: Self-pay | Admitting: Family Medicine

## 2015-03-16 NOTE — Telephone Encounter (Signed)
Electronic refill request, please advise  

## 2015-03-16 NOTE — Telephone Encounter (Signed)
It looks like she shouldn't need flexeril yet - uses qhs and got 30 with 5 ref three mo ago?  Please verify with her- taking it more?

## 2015-03-16 NOTE — Telephone Encounter (Signed)
Pharmacy advise me this was an error pt does still have refills of both Rxs on file, Rx declined

## 2015-03-21 DIAGNOSIS — N3942 Incontinence without sensory awareness: Secondary | ICD-10-CM | POA: Diagnosis not present

## 2015-03-21 DIAGNOSIS — N3941 Urge incontinence: Secondary | ICD-10-CM | POA: Diagnosis not present

## 2015-03-21 DIAGNOSIS — N393 Stress incontinence (female) (male): Secondary | ICD-10-CM | POA: Diagnosis not present

## 2015-04-10 DIAGNOSIS — Z79899 Other long term (current) drug therapy: Secondary | ICD-10-CM | POA: Diagnosis not present

## 2015-04-10 DIAGNOSIS — Z5181 Encounter for therapeutic drug level monitoring: Secondary | ICD-10-CM | POA: Diagnosis not present

## 2015-04-10 DIAGNOSIS — B370 Candidal stomatitis: Secondary | ICD-10-CM | POA: Diagnosis not present

## 2015-04-10 DIAGNOSIS — L439 Lichen planus, unspecified: Secondary | ICD-10-CM | POA: Diagnosis not present

## 2015-04-10 DIAGNOSIS — L438 Other lichen planus: Secondary | ICD-10-CM | POA: Diagnosis not present

## 2015-04-18 DIAGNOSIS — H4011X1 Primary open-angle glaucoma, mild stage: Secondary | ICD-10-CM | POA: Diagnosis not present

## 2015-04-29 DIAGNOSIS — Z23 Encounter for immunization: Secondary | ICD-10-CM | POA: Diagnosis not present

## 2015-06-07 ENCOUNTER — Other Ambulatory Visit: Payer: Self-pay | Admitting: Family Medicine

## 2015-06-07 NOTE — Telephone Encounter (Signed)
Will refill electronically  

## 2015-06-07 NOTE — Telephone Encounter (Signed)
Electronic refill request, last refilled on 12/12/14 #30 with 5 additional refills, please advise

## 2015-06-26 DIAGNOSIS — N3941 Urge incontinence: Secondary | ICD-10-CM | POA: Diagnosis not present

## 2015-06-26 DIAGNOSIS — N3942 Incontinence without sensory awareness: Secondary | ICD-10-CM | POA: Diagnosis not present

## 2015-06-26 DIAGNOSIS — N393 Stress incontinence (female) (male): Secondary | ICD-10-CM | POA: Diagnosis not present

## 2015-07-05 ENCOUNTER — Other Ambulatory Visit: Payer: Self-pay | Admitting: Family Medicine

## 2015-07-07 DIAGNOSIS — N3942 Incontinence without sensory awareness: Secondary | ICD-10-CM | POA: Diagnosis not present

## 2015-07-07 DIAGNOSIS — N3941 Urge incontinence: Secondary | ICD-10-CM | POA: Diagnosis not present

## 2015-07-27 DIAGNOSIS — M9902 Segmental and somatic dysfunction of thoracic region: Secondary | ICD-10-CM | POA: Diagnosis not present

## 2015-07-27 DIAGNOSIS — M9901 Segmental and somatic dysfunction of cervical region: Secondary | ICD-10-CM | POA: Diagnosis not present

## 2015-07-27 DIAGNOSIS — M542 Cervicalgia: Secondary | ICD-10-CM | POA: Diagnosis not present

## 2015-07-27 DIAGNOSIS — M546 Pain in thoracic spine: Secondary | ICD-10-CM | POA: Diagnosis not present

## 2015-07-31 DIAGNOSIS — M9902 Segmental and somatic dysfunction of thoracic region: Secondary | ICD-10-CM | POA: Diagnosis not present

## 2015-07-31 DIAGNOSIS — M546 Pain in thoracic spine: Secondary | ICD-10-CM | POA: Diagnosis not present

## 2015-07-31 DIAGNOSIS — M9901 Segmental and somatic dysfunction of cervical region: Secondary | ICD-10-CM | POA: Diagnosis not present

## 2015-07-31 DIAGNOSIS — M542 Cervicalgia: Secondary | ICD-10-CM | POA: Diagnosis not present

## 2015-08-02 DIAGNOSIS — M9902 Segmental and somatic dysfunction of thoracic region: Secondary | ICD-10-CM | POA: Diagnosis not present

## 2015-08-02 DIAGNOSIS — N390 Urinary tract infection, site not specified: Secondary | ICD-10-CM | POA: Diagnosis not present

## 2015-08-02 DIAGNOSIS — N393 Stress incontinence (female) (male): Secondary | ICD-10-CM | POA: Diagnosis not present

## 2015-08-02 DIAGNOSIS — M546 Pain in thoracic spine: Secondary | ICD-10-CM | POA: Diagnosis not present

## 2015-08-02 DIAGNOSIS — N3941 Urge incontinence: Secondary | ICD-10-CM | POA: Diagnosis not present

## 2015-08-02 DIAGNOSIS — N3942 Incontinence without sensory awareness: Secondary | ICD-10-CM | POA: Diagnosis not present

## 2015-08-02 DIAGNOSIS — M9901 Segmental and somatic dysfunction of cervical region: Secondary | ICD-10-CM | POA: Diagnosis not present

## 2015-08-02 DIAGNOSIS — M542 Cervicalgia: Secondary | ICD-10-CM | POA: Diagnosis not present

## 2015-08-04 DIAGNOSIS — M546 Pain in thoracic spine: Secondary | ICD-10-CM | POA: Diagnosis not present

## 2015-08-04 DIAGNOSIS — M9901 Segmental and somatic dysfunction of cervical region: Secondary | ICD-10-CM | POA: Diagnosis not present

## 2015-08-04 DIAGNOSIS — M9902 Segmental and somatic dysfunction of thoracic region: Secondary | ICD-10-CM | POA: Diagnosis not present

## 2015-08-04 DIAGNOSIS — M542 Cervicalgia: Secondary | ICD-10-CM | POA: Diagnosis not present

## 2015-08-08 DIAGNOSIS — Z885 Allergy status to narcotic agent status: Secondary | ICD-10-CM | POA: Diagnosis not present

## 2015-08-08 DIAGNOSIS — L438 Other lichen planus: Secondary | ICD-10-CM | POA: Diagnosis not present

## 2015-08-08 DIAGNOSIS — Z888 Allergy status to other drugs, medicaments and biological substances status: Secondary | ICD-10-CM | POA: Diagnosis not present

## 2015-08-08 DIAGNOSIS — Z882 Allergy status to sulfonamides status: Secondary | ICD-10-CM | POA: Diagnosis not present

## 2015-08-08 DIAGNOSIS — B370 Candidal stomatitis: Secondary | ICD-10-CM | POA: Diagnosis not present

## 2015-08-08 DIAGNOSIS — Z881 Allergy status to other antibiotic agents status: Secondary | ICD-10-CM | POA: Diagnosis not present

## 2015-08-08 DIAGNOSIS — Z79899 Other long term (current) drug therapy: Secondary | ICD-10-CM | POA: Diagnosis not present

## 2015-08-08 DIAGNOSIS — Z880 Allergy status to penicillin: Secondary | ICD-10-CM | POA: Diagnosis not present

## 2015-08-16 ENCOUNTER — Other Ambulatory Visit: Payer: Self-pay | Admitting: Family Medicine

## 2015-08-17 ENCOUNTER — Other Ambulatory Visit: Payer: Self-pay | Admitting: Family Medicine

## 2015-09-05 ENCOUNTER — Other Ambulatory Visit: Payer: Self-pay | Admitting: Family Medicine

## 2015-09-05 NOTE — Telephone Encounter (Signed)
Last CPE was 10/25/14 and no f/u or CPE scheduled since then (did have an acute appt in April), please advise

## 2015-09-05 NOTE — Telephone Encounter (Signed)
Please refill for a year Schedule her next PE if she wants to  Thanks

## 2015-09-06 NOTE — Telephone Encounter (Signed)
appt scheduled and med refilled 

## 2015-09-08 ENCOUNTER — Other Ambulatory Visit: Payer: Self-pay | Admitting: Family Medicine

## 2015-10-02 ENCOUNTER — Other Ambulatory Visit: Payer: Self-pay

## 2015-10-02 ENCOUNTER — Other Ambulatory Visit: Payer: Self-pay | Admitting: Family Medicine

## 2015-10-02 DIAGNOSIS — Z1231 Encounter for screening mammogram for malignant neoplasm of breast: Secondary | ICD-10-CM

## 2015-10-06 ENCOUNTER — Other Ambulatory Visit: Payer: Self-pay | Admitting: Family Medicine

## 2015-10-11 DIAGNOSIS — N3941 Urge incontinence: Secondary | ICD-10-CM | POA: Diagnosis not present

## 2015-10-18 DIAGNOSIS — H401122 Primary open-angle glaucoma, left eye, moderate stage: Secondary | ICD-10-CM | POA: Diagnosis not present

## 2015-10-18 DIAGNOSIS — H401111 Primary open-angle glaucoma, right eye, mild stage: Secondary | ICD-10-CM | POA: Diagnosis not present

## 2015-10-18 DIAGNOSIS — H524 Presbyopia: Secondary | ICD-10-CM | POA: Diagnosis not present

## 2015-10-18 DIAGNOSIS — Z961 Presence of intraocular lens: Secondary | ICD-10-CM | POA: Diagnosis not present

## 2015-11-01 ENCOUNTER — Telehealth (INDEPENDENT_AMBULATORY_CARE_PROVIDER_SITE_OTHER): Payer: Medicare Other | Admitting: Family Medicine

## 2015-11-01 ENCOUNTER — Other Ambulatory Visit (INDEPENDENT_AMBULATORY_CARE_PROVIDER_SITE_OTHER): Payer: Medicare Other

## 2015-11-01 ENCOUNTER — Ambulatory Visit: Payer: Medicare Other

## 2015-11-01 DIAGNOSIS — I10 Essential (primary) hypertension: Secondary | ICD-10-CM

## 2015-11-01 DIAGNOSIS — E559 Vitamin D deficiency, unspecified: Secondary | ICD-10-CM | POA: Diagnosis not present

## 2015-11-01 DIAGNOSIS — E039 Hypothyroidism, unspecified: Secondary | ICD-10-CM

## 2015-11-01 DIAGNOSIS — E785 Hyperlipidemia, unspecified: Secondary | ICD-10-CM

## 2015-11-01 DIAGNOSIS — R739 Hyperglycemia, unspecified: Secondary | ICD-10-CM

## 2015-11-01 LAB — COMPREHENSIVE METABOLIC PANEL
ALT: 15 U/L (ref 0–35)
AST: 16 U/L (ref 0–37)
Albumin: 3.9 g/dL (ref 3.5–5.2)
Alkaline Phosphatase: 72 U/L (ref 39–117)
BUN: 19 mg/dL (ref 6–23)
CO2: 28 mEq/L (ref 19–32)
Calcium: 9.5 mg/dL (ref 8.4–10.5)
Chloride: 104 mEq/L (ref 96–112)
Creatinine, Ser: 0.97 mg/dL (ref 0.40–1.20)
GFR: 58.64 mL/min — ABNORMAL LOW (ref >60.00)
Glucose, Bld: 90 mg/dL (ref 70–99)
Potassium: 4.4 mEq/L (ref 3.5–5.1)
Sodium: 140 mEq/L (ref 135–145)
Total Bilirubin: 0.6 mg/dL (ref 0.2–1.2)
Total Protein: 6.6 g/dL (ref 6.0–8.3)

## 2015-11-01 LAB — CBC WITH DIFFERENTIAL/PLATELET
Basophils Absolute: 0 10*3/uL (ref 0.0–0.1)
Basophils Relative: 0.4 % (ref 0.0–3.0)
Eosinophils Absolute: 0.3 10*3/uL (ref 0.0–0.7)
Eosinophils Relative: 4.1 % (ref 0.0–5.0)
HCT: 44.3 % (ref 36.0–46.0)
Hemoglobin: 14.8 g/dL (ref 12.0–15.0)
Lymphocytes Relative: 44.2 % (ref 12.0–46.0)
Lymphs Abs: 3.3 10*3/uL (ref 0.7–4.0)
MCHC: 33.5 g/dL (ref 30.0–36.0)
MCV: 97.8 fl (ref 78.0–100.0)
Monocytes Absolute: 0.6 10*3/uL (ref 0.1–1.0)
Monocytes Relative: 7.7 % (ref 3.0–12.0)
Neutro Abs: 3.2 10*3/uL (ref 1.4–7.7)
Neutrophils Relative %: 43.6 % (ref 43.0–77.0)
Platelets: 261 10*3/uL (ref 150.0–400.0)
RBC: 4.52 Mil/uL (ref 3.87–5.11)
RDW: 14.1 % (ref 11.5–15.5)
WBC: 7.4 10*3/uL (ref 4.0–10.5)

## 2015-11-01 LAB — LIPID PANEL
Cholesterol: 200 mg/dL (ref 0–200)
HDL: 72.6 mg/dL (ref >39.00)
LDL Cholesterol: 106 mg/dL — ABNORMAL HIGH (ref 0–99)
NonHDL: 127.64
Total CHOL/HDL Ratio: 3
Triglycerides: 107 mg/dL (ref 0.0–149.0)
VLDL: 21.4 mg/dL (ref 0.0–40.0)

## 2015-11-01 LAB — TSH: TSH: 3.13 u[IU]/mL (ref 0.35–4.50)

## 2015-11-01 LAB — HEMOGLOBIN A1C: Hgb A1c MFr Bld: 5.3 % (ref 4.6–6.5)

## 2015-11-01 LAB — VITAMIN D 25 HYDROXY (VIT D DEFICIENCY, FRACTURES): VITD: 33.04 ng/mL (ref 30.00–100.00)

## 2015-11-01 NOTE — Telephone Encounter (Signed)
-----   Message from Ellamae Sia sent at 11/01/2015 10:32 AM EST ----- Regarding: lab order, asap. Thanks AWV lab orders

## 2015-11-03 ENCOUNTER — Ambulatory Visit
Admission: RE | Admit: 2015-11-03 | Discharge: 2015-11-03 | Disposition: A | Discharge status: Home / Self Care | Payer: Medicare Other | Source: Ambulatory Visit

## 2015-11-03 DIAGNOSIS — Z1231 Encounter for screening mammogram for malignant neoplasm of breast: Secondary | ICD-10-CM | POA: Diagnosis not present

## 2015-11-03 LAB — HM MAMMOGRAPHY: HM Mammogram: NORMAL

## 2015-11-06 ENCOUNTER — Ambulatory Visit (INDEPENDENT_AMBULATORY_CARE_PROVIDER_SITE_OTHER): Payer: Medicare Other

## 2015-11-06 ENCOUNTER — Ambulatory Visit (INDEPENDENT_AMBULATORY_CARE_PROVIDER_SITE_OTHER): Payer: Medicare Other | Admitting: Family Medicine

## 2015-11-06 ENCOUNTER — Encounter: Payer: Self-pay | Admitting: Family Medicine

## 2015-11-06 VITALS — BP 122/80 | HR 74 | Temp 97.6°F | Ht 65.0 in | Wt 191.5 lb

## 2015-11-06 DIAGNOSIS — E559 Vitamin D deficiency, unspecified: Secondary | ICD-10-CM

## 2015-11-06 DIAGNOSIS — E039 Hypothyroidism, unspecified: Secondary | ICD-10-CM | POA: Diagnosis not present

## 2015-11-06 DIAGNOSIS — R739 Hyperglycemia, unspecified: Secondary | ICD-10-CM

## 2015-11-06 DIAGNOSIS — E669 Obesity, unspecified: Secondary | ICD-10-CM

## 2015-11-06 DIAGNOSIS — I10 Essential (primary) hypertension: Secondary | ICD-10-CM | POA: Diagnosis not present

## 2015-11-06 DIAGNOSIS — Z0000 Encounter for general adult medical examination without abnormal findings: Secondary | ICD-10-CM | POA: Diagnosis not present

## 2015-11-06 DIAGNOSIS — M8580 Other specified disorders of bone density and structure, unspecified site: Secondary | ICD-10-CM

## 2015-11-06 DIAGNOSIS — E785 Hyperlipidemia, unspecified: Secondary | ICD-10-CM

## 2015-11-06 DIAGNOSIS — E2839 Other primary ovarian failure: Secondary | ICD-10-CM

## 2015-11-06 MED ORDER — LEVOTHYROXINE SODIUM 88 MCG PO TABS: 88.0000 ug | ORAL_TABLET | Freq: Every day | ORAL | Status: DC

## 2015-11-06 MED ORDER — CYCLOBENZAPRINE HCL 10 MG PO TABS: 10.0000 mg | ORAL_TABLET | Freq: Every evening | ORAL | Status: DC | PRN

## 2015-11-06 MED ORDER — ESTRADIOL 1 MG PO TABS: 1.0000 mg | ORAL_TABLET | Freq: Every day | ORAL | Status: DC

## 2015-11-06 NOTE — Assessment & Plan Note (Signed)
Lab Results  Component Value Date   HGBA1C 5.3 11/01/2015   This is controlled with diet Wt loss enc to prevent DM

## 2015-11-06 NOTE — Progress Notes (Signed)
Subjective:   Linda Mcgrath is a 82 y.o. female who presents for Medicare Annual (Subsequent) preventive examination.  Cardiac Risk Factors include: advanced age (>46men, >55 women);dyslipidemia;hypertension;obesity (BMI >30kg/m2)     Objective:     Vitals: BP 122/80 mmHg  Pulse 74  Temp(Src) 97.6 F (36.4 C) (Oral)  Ht 5\' 5"  (1.651 m)  Wt 191 lb 8 oz (86.864 kg)  BMI 31.87 kg/m2  SpO2 98%  LMP 08/26/1969  Tobacco History  Smoking status  . Never Smoker   Smokeless tobacco  . Never Used     Counseling given: No   Past Medical History  Diagnosis Date  . Osteoarthritis   . Fibromyalgia   . Hypertension   . Hypothyroidism   . Hyperlipidemia   . Internal hemorrhoids   . Colon polyp 1999    small polyp  . Diverticulosis   . Lung nodule     stable LUL 9 mm  . Cataract     Bil/lens implant  . Allergic rhinitis   . Leg cramps   . Menopausal syndrome   . UTI (urinary tract infection)    Past Surgical History  Procedure Laterality Date  . Abdominal hysterectomy  1975    total-fibroids  . Rotator cuff repair      x 2 /right shoulder  . Skin cancer excision      pre-melanoma / on face  . Breast biopsy      benign  . Cataract extraction      bilateral  . Abd u/s  11/1998    negative  . Abd u/s  01/2001    gallbladder polyps  . Hida scan  01/2001    Negative  . Colonoscopy  1998    Diverticulosis; polyp\  . Dexa  10/2001    osteopenia  . Esophagogastroduodenoscopy  2004  . Colonoscopy  09/2002    Diverticulosis, hem  . Colonoscopy  12/2007    diverticulosis, polyp  . Appendectomy  1975  . Cholecystectomy    . Knee surgery      right knee / 11/2004  . Laser surgery for glaucoma Left Sep 21, 2014   Family History  Problem Relation Age of Onset  . Parkinsonism Father   . Colon cancer Brother   . Kidney failure Son     congenital  . Allergies Mother   . Heart disease Mother    History  Sexual Activity  . Sexual Activity: No     Outpatient Encounter Prescriptions as of 11/06/2015  Medication Sig  . albuterol (PROVENTIL HFA;VENTOLIN HFA) 108 (90 BASE) MCG/ACT inhaler Inhale 2 puffs into the lungs every 4 (four) hours as needed for wheezing.  . Ascorbic Acid (VITAMIN C) 1000 MG tablet Take 1,000 mg by mouth daily.    . B Complex Vitamins (VITAMIN B COMPLEX PO) Take 1 tablet by mouth daily.  . Biotin 10 MG TABS Take by mouth. daily   . buPROPion (WELLBUTRIN XL) 150 MG 24 hr tablet Take 1 tablet (150 mg total) by mouth daily.  . Calcium Carbonate-Vit D-Min 600-400 MG-UNIT TABS Take by mouth. daily   . chlorhexidine (PERIDEX) 0.12 % solution RINSE TWICE DAILY AFTER BRUSHING USING A CAPFUL AS MARKED  . Cholecalciferol (VITAMIN D) 2000 UNITS CAPS Take 1 capsule by mouth 2 (two) times daily.    . clotrimazole (MYCELEX) 10 MG troche Take 10 mg by mouth daily.  . Cranberry 500 MG CAPS Take by mouth. daily  . cyclobenzaprine (FLEXERIL) 10 MG  tablet TAKE 1 TABLET BY MOUTH AT BEDTIME AS NEEDED  . estradiol (ESTRACE) 1 MG tablet TAKE ONE TABLET BY MOUTH ONE TIME DAILY  . fluocinonide-emollient (LIDEX-E) 0.05 % cream Apply to itching areas and body twice daily as needed  . fluticasone (FLONASE) 50 MCG/ACT nasal spray Place 1 spray into both nostrils daily as needed for allergies or rhinitis.  . folic acid (FOLVITE) 1 MG tablet Take 1 mg by mouth daily.  Marland Kitchen ibuprofen (ADVIL,MOTRIN) 200 MG tablet Take 400 mg by mouth 2 (two) times daily as needed.    Marland Kitchen levothyroxine (SYNTHROID, LEVOTHROID) 88 MCG tablet TAKE ONE TABLET BY MOUTH EVERY MORNING BEFORE BREAKFAST  . Lutein 20 MG CAPS Take by mouth. daily   . montelukast (SINGULAIR) 10 MG tablet TAKE ONE TABLET BY MOUTH AT BEDTIME  . Multiple Vitamin (MULTIVITAMIN) capsule Take 1 capsule by mouth daily.    . Pumpkin Seed-Soy Germ (AZO BLADDER CONTROL/GO-LESS PO) Take 310 mg by mouth 3 (three) times daily.  . tacrolimus (PROGRAF) 1 MG capsule 1 mg. Dissolve 1 cap in 1/2 liter of water,  swish and spit twice daily  . TURMERIC PO Take 1 capsule by mouth daily.  . vitamin B-12 (CYANOCOBALAMIN) 500 MCG tablet Take 500 mcg by mouth daily.  . fluconazole (DIFLUCAN) 150 MG tablet Take one pill by mouth today, then repeat in 3 days and 6 days (Patient not taking: Reported on 11/06/2015)   No facility-administered encounter medications on file as of 11/06/2015.    Activities of Daily Living In your present state of health, do you have any difficulty performing the following activities: 11/06/2015  Hearing? N  Vision? N  Difficulty concentrating or making decisions? N  Walking or climbing stairs? N  Dressing or bathing? N  Doing errands, shopping? N  Preparing Food and eating ? N  Using the Toilet? N  In the past six months, have you accidently leaked urine? Y  Do you have problems with loss of bowel control? N  Managing your Medications? N  Managing your Finances? N  Housekeeping or managing your Housekeeping? N    Patient Care Team: Abner Greenspan, MD as PCP - General Rana Snare, MD as Consulting Physician (Urology) Randel Pigg, MD as Referring Physician (Dermatology) Luberta Mutter, MD as Consulting Physician (Ophthalmology)  Dr. Truddie Crumble - Consulting Physician ([place])    Assessment:     Exercise Activities and Dietary recommendations Current Exercise Habits: Home exercise routine, Type of exercise: walking, Time (Minutes): 60, Frequency (Times/Week): 7, Weekly Exercise (Minutes/Week): 420, Intensity: Mild  Goals    . Increase physical activity     Starting 11/06/2015, I will increase walking for an additional 20 minutes daily.       Fall Risk Fall Risk  11/06/2015 10/25/2014 06/16/2013  Falls in the past year? No No Yes  Number falls in past yr: - - 1  Injury with Fall? - - No   Depression Screen PHQ 2/9 Scores 11/06/2015 10/25/2014 06/16/2013  PHQ - 2 Score 0 1 0  PHQ- 9 Score - 3 -     Cognitive Testing MMSE - Mini Mental State Exam  11/06/2015  Orientation to time 5  Orientation to Place 5  Registration 3  Attention/ Calculation 5  Recall 3  Language- name 2 objects 0  Language- repeat 1  Language- follow 3 step command 3  Language- read & follow direction 1  Write a sentence 0  Copy design 0  Total score 26  Immunization History  Administered Date(s) Administered  . Influenza Split 05/11/2011  . Influenza Whole 05/27/2007, 05/21/2008, 04/29/2009, 05/28/2012  . Influenza, High Dose Seasonal PF 05/12/2014  . Influenza-Unspecified 05/07/2013  . Pneumococcal Conjugate-13 10/25/2014  . Pneumococcal Polysaccharide-23 08/27/2003  . Td 12/26/2003  . Tdap 11/07/2014  . Zoster 03/29/2008   Screening Tests Health Maintenance  Topic Date Due  . INFLUENZA VACCINE  03/26/2016  . MAMMOGRAM  11/02/2016  . TETANUS/TDAP  11/06/2024  . DEXA SCAN  Completed  . ZOSTAVAX  Completed  . PNA vac Low Risk Adult  Completed    Hearing Screening   125Hz  250Hz  500Hz  1000Hz  2000Hz  4000Hz  8000Hz   Right ear:   40 40 40 40   Left ear:   40 40 40 40    Vision Screening Comments: Last eye exam 10/18/2015     Plan:      I have personally reviewed the Medicare Annual Wellness questionnaire and have noted the following in the patient's chart:  A. Medical and social history B. Use of alcohol, tobacco or illicit drugs  C. Current medications and supplements D. Functional ability and status E.  Nutritional status F.  Physical activity G. Advance directives H. List of other physicians I.  Hospitalizations, surgeries, and ER visits in previous 12 months J.  [place] to include hearing, vision, cognitive, depression L. Referrals and appointments - none  In addition, I reviewed preventive protocols, quality metrics, and best practice recommendations specific to patient. A written personalized care plan for preventive services as well as general preventive health recommendations were provided to patient.  See  attached scanned questionnaire for additional information.   Signed,   Lindell Noe, MHA, BS, LPN Health Advisor [id number]

## 2015-11-06 NOTE — Assessment & Plan Note (Signed)
Hypothyroidism  Pt has no clinical changes No change in energy level/ hair or skin/ edema and no tremor Lab Results  Component Value Date   TSH 3.13 11/01/2015

## 2015-11-06 NOTE — Patient Instructions (Signed)
Keep exercising  Labs are stable  Do add another 1000 iu of vitamin D daily  Take care of yourself

## 2015-11-06 NOTE — Progress Notes (Signed)
Subjective:    Patient ID: Linda Mcgrath, female    DOB: November 28, 1932, 82 y.o.   MRN: [id number]  HPI Here for annual f/u of chronic health problems   Doing pretty well overall   Had AMW visit with Virl Axe  That went well   Mm was 3/17- last Friday  Self breast exam- no lumps  Gyn- no problems /no d/c or symptoms/does not see a gyn  She still takes estrace once daily - for menopausal symptoms  Voices understanding of pros/cons/risks incl inc risk of breast cancer -she chooses to stay on it    UTD imms   colonosc 7/14 with adenoma - did not recommend a recall based on age  fam hx of colon cancer in brother  No symptoms  Does not want to continue screening   dexa 3/16 Osteopenia-slt worse at [facility] No falls or fractures  D level is 33  Takes ca plus D twice daily  Takes estrace also  Also takes a multi vitamin  Also exercises regularly - also does housework and works in the yard    bp is stable today  No cp or palpitations or headaches or edema  No side effects to medicines  BP Readings from Last 3 Encounters:  11/06/15 122/80  11/06/15 122/80  01/24/15 124/84      Hypothyroidism  Pt has no clinical changes No change in energy level/ hair or skin/ edema and no tremor Lab Results  Component Value Date   TSH 3.13 11/01/2015     Obesity- wt up 1 lb with bmi of 31  Hyperlipidemia Lab Results  Component Value Date   CHOL 200 11/01/2015   CHOL 206* 10/18/2014   CHOL 217* 06/09/2013   Lab Results  Component Value Date   HDL 72.60 11/01/2015   HDL 65.00 10/18/2014   HDL 65.70 06/09/2013   Lab Results  Component Value Date   LDLCALC 106* 11/01/2015   LDLCALC 116* 10/18/2014   LDLCALC 110* 06/10/2012   Lab Results  Component Value Date   TRIG 107.0 11/01/2015   TRIG 124.0 10/18/2014   TRIG 106.0 06/09/2013   Lab Results  Component Value Date   CHOLHDL 3 11/01/2015   CHOLHDL 3 10/18/2014   CHOLHDL 3 06/09/2013   Lab Results  Component Value  Date   LDLDIRECT 125.7 06/09/2013   LDLDIRECT 124.0 11/27/2011   LDLDIRECT 127.3 11/27/2010    Better cholesterol - HDL is up and LDL is down  She has cut back on dairy - using coconut milk instead  No greasy foods / not a lot of fat   Hyperglycemia Lab Results  Component Value Date   HGBA1C 5.3 11/01/2015  not in DM range   Still sees specialist for periodontal problems   Patient Active Problem List   Diagnosis Date Noted  . Obesity 11/06/2015  . Urge incontinence 01/24/2015  . Grief reaction 10/25/2014  . Estrogen deficiency 10/25/2014  . Lichen planus 12/20/2013  . Encounter for Medicare annual wellness exam 06/17/2013  . Aphthous ulcer of mouth 03/16/2013  . Hyperglycemia 06/03/2011  . HYPERTENSION, BENIGN ESSENTIAL 10/23/2010  . CONSTIPATION 10/23/2010  . HEMATOCHEZIA 10/23/2010  . OSTEOARTHRITIS, HANDS, BILATERAL 01/24/2010  . Hypothyroidism 08/24/2008  . Vitamin D deficiency 06/06/2008  . Hyperlipidemia 06/06/2008  . MENOPAUSAL SYNDROME 03/29/2008  . Osteopenia 03/29/2008  . ALLERGIC RHINITIS 01/25/2008  . IBS 01/25/2008  . OSTEOARTHRITIS 01/25/2008  . FIBROMYALGIA 01/25/2008   Past Medical History  Diagnosis Date  . Osteoarthritis   .  Fibromyalgia   . Hypertension   . Hypothyroidism   . Hyperlipidemia   . Internal hemorrhoids   . Colon polyp 1999    small polyp  . Diverticulosis   . Lung nodule     stable LUL 9 mm  . Cataract     Bil/lens implant  . Allergic rhinitis   . Leg cramps   . Menopausal syndrome   . UTI (urinary tract infection)    Past Surgical History  Procedure Laterality Date  . Abdominal hysterectomy  1975    total-fibroids  . Rotator cuff repair      x 2 /right shoulder  . Skin cancer excision      pre-melanoma / on face  . Breast biopsy      benign  . Cataract extraction      bilateral  . Abd u/s  11/1998    negative  . Abd u/s  01/2001    gallbladder polyps  . Hida scan  01/2001    Negative  . Colonoscopy  1998     Diverticulosis; polyp\  . Dexa  10/2001    osteopenia  . Esophagogastroduodenoscopy  2004  . Colonoscopy  09/2002    Diverticulosis, hem  . Colonoscopy  12/2007    diverticulosis, polyp  . Appendectomy  1975  . Cholecystectomy    . Knee surgery      right knee / 11/2004  . Laser surgery for glaucoma Left Sep 21, 2014   Social History  Substance Use Topics  . Smoking status: Never Smoker   . Smokeless tobacco: Never Used  . Alcohol Use: No     Comment: occasional-rare   Family History  Problem Relation Age of Onset  . Parkinsonism Father   . Colon cancer Brother   . Kidney failure Son     congenital  . Allergies Mother   . Heart disease Mother    Allergies  Allergen Reactions  . Amlodipine Besylate     REACTION: muscle spasms, extremity swelling, low bp  . Amoxicillin   . Ciprofloxacin     REACTION: muscle spasms, insomnia  . Codeine     REACTION: hallucinations  . Penicillins   . Propoxyphene Hcl   . Sulfamethoxazole-Trimethoprim     REACTION: dizzy, could not focus eyes and could not concentrate and nausea   Current Outpatient Prescriptions on File Prior to Visit  Medication Sig Dispense Refill  . albuterol (PROVENTIL HFA;VENTOLIN HFA) 108 (90 BASE) MCG/ACT inhaler Inhale 2 puffs into the lungs every 4 (four) hours as needed for wheezing. 1 Inhaler 5  . Ascorbic Acid (VITAMIN C) 1000 MG tablet Take 1,000 mg by mouth daily.      . B Complex Vitamins (VITAMIN B COMPLEX PO) Take 1 tablet by mouth daily.    . Biotin 10 MG TABS Take by mouth. daily     . Calcium Carbonate-Vit D-Min 600-400 MG-UNIT TABS Take by mouth. daily     . chlorhexidine (PERIDEX) 0.12 % solution RINSE TWICE DAILY AFTER BRUSHING USING A CAPFUL AS MARKED    . clotrimazole (MYCELEX) 10 MG troche Take 10 mg by mouth daily.    . Cranberry 500 MG CAPS Take by mouth. daily    . fluticasone (FLONASE) 50 MCG/ACT nasal spray Place 1 spray into both nostrils daily as needed for allergies or rhinitis.    .  folic acid (FOLVITE) 1 MG tablet Take 1 mg by mouth daily.    Marland Kitchen ibuprofen (ADVIL,MOTRIN) 200 MG tablet Take  400 mg by mouth 2 (two) times daily as needed.      . Lutein 20 MG CAPS Take by mouth. daily     . montelukast (SINGULAIR) 10 MG tablet TAKE ONE TABLET BY MOUTH AT BEDTIME 90 tablet 0  . Multiple Vitamin (MULTIVITAMIN) capsule Take 1 capsule by mouth daily.      . TURMERIC PO Take 1 capsule by mouth daily.    . vitamin B-12 (CYANOCOBALAMIN) 500 MCG tablet Take 500 mcg by mouth daily.     No current facility-administered medications on file prior to visit.    Review of Systems Review of Systems  Review of Systems  Constitutional: Negative for fever, appetite change, fatigue and unexpected weight change.  Eyes: Negative for pain and visual disturbance.  Respiratory: Negative for cough and shortness of breath.   Cardiovascular: Negative for cp or palpitations    Gastrointestinal: Negative for nausea, diarrhea and constipation.  Genitourinary: Negative for urgency and frequency.  Skin: Negative for pallor or rash   Neurological: Negative for weakness, light-headedness, numbness and headaches.  Hematological: Negative for adenopathy. Does not bruise/bleed easily.  Psychiatric/Behavioral: Negative for dysphoric mood. The patient is not nervous/anxious.  grief symptoms are improved           Objective:   Physical Exam  Constitutional: She appears well-developed and well-nourished. No distress.  obese and well appearing   HENT:  Head: Normocephalic and atraumatic.  Right Ear: External ear normal.  Left Ear: External ear normal.  Mouth/Throat: Oropharynx is clear and moist.  Nares are boggy  Eyes: Conjunctivae and EOM are normal. Pupils are equal, round, and reactive to light. No scleral icterus.  Neck: Normal range of motion. Neck supple. No JVD present. Carotid bruit is not present. No thyromegaly present.  Cardiovascular: Normal rate, regular rhythm, normal heart sounds and  intact distal pulses.  Exam reveals no gallop.   Pulmonary/Chest: Effort normal and breath sounds normal. No respiratory distress. She has no wheezes. She exhibits no tenderness.  Abdominal: Soft. Bowel sounds are normal. She exhibits no distension, no abdominal bruit and no mass. There is no tenderness.  Genitourinary: No breast swelling, tenderness, discharge or bleeding.  Breast exam: No mass, nodules, thickening, tenderness, bulging, retraction, inflamation, nipple discharge or skin changes noted.  No axillary or clavicular LA.      Musculoskeletal: Normal range of motion. She exhibits no edema or tenderness.  Lymphadenopathy:    She has no cervical adenopathy.  Neurological: She is alert. She has normal reflexes. No cranial nerve deficit. She exhibits normal muscle tone. Coordination normal.  Skin: Skin is warm and dry. No rash noted. No erythema. No pallor.  Some small SKs on trunk   Psychiatric: She has a normal mood and affect.          Assessment & Plan:   Problem List Items Addressed This Visit      Cardiovascular and Mediastinum   HYPERTENSION, BENIGN ESSENTIAL - Primary    bp in fair control at this time  BP Readings from Last 1 Encounters:  11/06/15 122/80   No changes needed Disc lifstyle change with low sodium diet and exercise  Labs reviewed Wt loss enc        Endocrine   Hypothyroidism    Hypothyroidism  Pt has no clinical changes No change in energy level/ hair or skin/ edema and no tremor Lab Results  Component Value Date   TSH 3.13 11/01/2015  Relevant Medications   levothyroxine (SYNTHROID, LEVOTHROID) 88 MCG tablet     Musculoskeletal and Integument   Osteopenia    dexa 3/16 - slt worse at [facility]  On estrace  No falls or fx On ca and D D level 33- asked to add another 1000 iu daily   Disc need for calcium/ vitamin D/ wt bearing exercise and bone density test every 2 y to monitor Disc safety/ fracture risk in detail           Other   Estrogen deficiency    Pt continues estrace for intolerable menopausal symptoms  Understands pros/cons and risks (breast cancer and blood clots) Chooses to continue it  Also helps bone density/fracture risk      Hyperglycemia    Lab Results  Component Value Date   HGBA1C 5.3 11/01/2015   This is controlled with diet Wt loss enc to prevent DM      Hyperlipidemia    Disc goals for lipids and reasons to control them Rev labs with pt Rev low sat fat diet in detail Improved HDL and LDL -commended on good habits      Obesity    bmi of 31 Discussed how this problem influences overall health and the risks it imposes  Reviewed plan for weight loss with lower calorie diet (via better food choices and also portion control or program like weight watchers) and exercise building up to or more than 30 minutes 5 days per week including some aerobic activity         Vitamin D deficiency    Vit D level is 33 Enc to add another 1000 iu daily for bone and overall health  Has osteopenia

## 2015-11-06 NOTE — Assessment & Plan Note (Signed)
Vit D level is 33 Enc to add another 1000 iu daily for bone and overall health  Has osteopenia

## 2015-11-06 NOTE — Assessment & Plan Note (Signed)
Disc goals for lipids and reasons to control them Rev labs with pt Rev low sat fat diet in detail Improved HDL and LDL -commended on good habits

## 2015-11-06 NOTE — Assessment & Plan Note (Signed)
bmi of 31 Discussed how this problem influences overall health and the risks it imposes  Reviewed plan for weight loss with lower calorie diet (via better food choices and also portion control or program like weight watchers) and exercise building up to or more than 30 minutes 5 days per week including some aerobic activity

## 2015-11-06 NOTE — Assessment & Plan Note (Signed)
dexa 3/16 - slt worse at [facility]  On estrace  No falls or fx On ca and D D level 33- asked to add another 1000 iu daily   Disc need for calcium/ vitamin D/ wt bearing exercise and bone density test every 2 y to monitor Disc safety/ fracture risk in detail

## 2015-11-06 NOTE — Assessment & Plan Note (Signed)
bp in fair control at this time  BP Readings from Last 1 Encounters:  11/06/15 122/80   No changes needed Disc lifstyle change with low sodium diet and exercise  Labs reviewed Wt loss enc

## 2015-11-06 NOTE — Progress Notes (Signed)
Pre visit review using our clinic review tool, if applicable. No additional management support is needed unless otherwise documented below in the visit note. 

## 2015-11-06 NOTE — Patient Instructions (Signed)
Linda Mcgrath , Thank you for taking time to come for your Medicare Wellness Visit. I appreciate your ongoing commitment to your health goals. Please review the following plan we discussed and let me know if I can assist you in the future.   These are the goals we discussed: Goals    . Increase physical activity     Starting 11/06/2015, I will increase walking for an additional 20 minutes daily.        This is a list of the screening recommended for you and due dates:  Health Maintenance  Topic Date Due  . Flu Shot  03/26/2016  . Mammogram  11/02/2016  . Tetanus Vaccine  11/06/2024  . DEXA scan (bone density measurement)  Completed  . Shingles Vaccine  Completed  . Pneumonia vaccines  Completed   Preventive Care for Adults  A healthy lifestyle and preventive care can promote health and wellness. Preventive health guidelines for adults include the following key practices.  . A routine yearly physical is a good way to check with your health care provider about your health and preventive screening. It is a chance to share any concerns and updates on your health and to receive a thorough exam.  . Visit your dentist for a routine exam and preventive care every 6 months. Brush your teeth twice a day and floss once a day. Good oral hygiene prevents tooth decay and gum disease.  . The frequency of eye exams is based on your age, health, family medical history, use  of contact lenses, and other factors. Follow your health care provider's ecommendations for frequency of eye exams.  . Eat a healthy diet. Foods like vegetables, fruits, whole grains, low-fat dairy products, and lean protein foods contain the nutrients you need without too many calories. Decrease your intake of foods high in solid fats, added sugars, and salt. Eat the right amount of calories for you. Get information about a proper diet from your health care provider, if necessary.  . Regular physical exercise is one of the  most important things you can do for your health. Most adults should get at least 150 minutes of moderate-intensity exercise (any activity that increases your heart rate and causes you to sweat) each week. In addition, most adults need muscle-strengthening exercises on 2 or more days a week.  Silver Sneakers may be a benefit available to you. To determine eligibility, you may visit the website: www.silversneakers.com or contact program at (262) [phone] Mon-Fri between 8AM-8PM.   . Maintain a healthy weight. The body mass index (BMI) is a screening tool to identify possible weight problems. It provides an estimate of body fat based on height and weight. Your health care provider can find your BMI and can help you achieve or maintain a healthy weight.   For adults 20 years and older: ? A BMI below 18.5 is considered underweight. ? A BMI of 18.5 to 24.9 is normal. ? A BMI of 25 to 29.9 is considered overweight. ? A BMI of 30 and above is considered obese.   . Maintain normal blood lipids and cholesterol levels by exercising and minimizing your intake of saturated fat. Eat a balanced diet with plenty of fruit and vegetables. Blood tests for lipids and cholesterol should begin at age 61 and be repeated every 5 years. If your lipid or cholesterol levels are high, you are over 50, or you are at high risk for heart disease, you may need your cholesterol levels checked more frequently. Ongoing  high lipid and cholesterol levels should be treated with medicines if diet and exercise are not working.  . If you smoke, find out from your health care provider how to quit. If you do not use tobacco, please do not start.  . If you choose to drink alcohol, please do not consume more than 2 drinks per day. One drink is considered to be 12 ounces (355 mL) of beer, 5 ounces (148 mL) of wine, or 1.5 ounces (44 mL) of liquor.  . If you are 60-59 years old, ask your health care provider if you should take aspirin to  prevent strokes.  . Use sunscreen. Apply sunscreen liberally and repeatedly throughout the day. You should seek shade when your shadow is shorter than you. Protect yourself by wearing long sleeves, pants, a wide-brimmed hat, and sunglasses year round, whenever you are outdoors.  . Once a month, do a whole body skin exam, using a mirror to look at the skin on your back. Tell your health care provider of new moles, moles that have irregular borders, moles that are larger than a pencil eraser, or moles that have changed in shape or color.

## 2015-11-06 NOTE — Progress Notes (Signed)
Subjective:    Patient ID: Linda Mcgrath, female    DOB: [birth year], 82 y.o.   MRN: [id number]  HPI    Review of Systems     Objective:   Physical Exam        Assessment & Plan:  I reviewed health advisor's note, was available for consultation, and agree with documentation and plan.

## 2015-11-06 NOTE — Assessment & Plan Note (Signed)
Pt continues estrace for intolerable menopausal symptoms  Understands pros/cons and risks (breast cancer and blood clots) Chooses to continue it  Also helps bone density/fracture risk

## 2015-11-08 ENCOUNTER — Encounter: Payer: Self-pay | Admitting: *Deleted

## 2015-11-08 ENCOUNTER — Other Ambulatory Visit: Payer: Medicare Other

## 2015-12-03 ENCOUNTER — Other Ambulatory Visit: Payer: Self-pay | Admitting: Family Medicine

## 2015-12-07 ENCOUNTER — Ambulatory Visit (INDEPENDENT_AMBULATORY_CARE_PROVIDER_SITE_OTHER): Payer: Medicare Other | Admitting: Internal Medicine

## 2015-12-07 ENCOUNTER — Encounter: Payer: Self-pay | Admitting: Internal Medicine

## 2015-12-07 VITALS — BP 130/78 | HR 81 | Temp 97.8°F | Wt 194.5 lb

## 2015-12-07 DIAGNOSIS — B349 Viral infection, unspecified: Secondary | ICD-10-CM | POA: Diagnosis not present

## 2015-12-07 DIAGNOSIS — B9789 Other viral agents as the cause of diseases classified elsewhere: Secondary | ICD-10-CM

## 2015-12-07 DIAGNOSIS — H43813 Vitreous degeneration, bilateral: Secondary | ICD-10-CM | POA: Diagnosis not present

## 2015-12-07 DIAGNOSIS — J329 Chronic sinusitis, unspecified: Secondary | ICD-10-CM | POA: Diagnosis not present

## 2015-12-07 MED ORDER — METHYLPREDNISOLONE ACETATE 80 MG/ML IJ SUSP
80.0000 mg | Freq: Once | INTRAMUSCULAR | Status: AC
  Administered 2015-12-07: 80 mg via INTRAMUSCULAR

## 2015-12-07 NOTE — Progress Notes (Signed)
Subjective:    Patient ID: Linda Mcgrath, female    DOB: [birth year], 82 y.o.   MRN: [id number]  HPI  Pt presents to the clinic today with c/o headache, nasal congestion, laryngtitis, and cough. This started 2 days ago. She is blowing clear mucous out of her nose. The cough is productive of yellow mucous. She denies fever, chills or body aches. She has taken Claritin, Flonase, Singulair and Mucinex with minimal relief. She has a history of allergies but reports this feels worse. She has not had sick contacts that she is aware of.   Review of Systems  Past Medical History  Diagnosis Date  . Osteoarthritis   . Fibromyalgia   . Hypertension   . Hypothyroidism   . Hyperlipidemia   . Internal hemorrhoids   . Colon polyp 1999    small polyp  . Diverticulosis   . Lung nodule     stable LUL 9 mm  . Cataract     Bil/lens implant  . Allergic rhinitis   . Leg cramps   . Menopausal syndrome   . UTI (urinary tract infection)     Current Outpatient Prescriptions  Medication Sig Dispense Refill  . albuterol (PROVENTIL HFA;VENTOLIN HFA) 108 (90 BASE) MCG/ACT inhaler Inhale 2 puffs into the lungs every 4 (four) hours as needed for wheezing. 1 Inhaler 5  . Ascorbic Acid (VITAMIN C) 1000 MG tablet Take 1,000 mg by mouth daily.      . B Complex Vitamins (VITAMIN B COMPLEX PO) Take 1 tablet by mouth daily.    . Biotin 10 MG TABS Take by mouth. daily     . Calcium Carbonate-Vit D-Min 600-400 MG-UNIT TABS Take by mouth. daily     . chlorhexidine (PERIDEX) 0.12 % solution RINSE TWICE DAILY AFTER BRUSHING USING A CAPFUL AS MARKED    . clotrimazole (MYCELEX) 10 MG troche Take 10 mg by mouth daily.    . Cranberry 500 MG CAPS Take by mouth. daily    . cyclobenzaprine (FLEXERIL) 10 MG tablet Take 1 tablet (10 mg total) by mouth at bedtime as needed. 90 tablet 3  . estradiol (ESTRACE) 1 MG tablet Take 1 tablet (1 mg total) by mouth daily. 90 tablet 3  . fluticasone (FLONASE) 50 MCG/ACT nasal  spray Place 1 spray into both nostrils daily as needed for allergies or rhinitis.    . folic acid (FOLVITE) 1 MG tablet Take 1 mg by mouth daily.    Marland Kitchen ibuprofen (ADVIL,MOTRIN) 200 MG tablet Take 400 mg by mouth 2 (two) times daily as needed.      Marland Kitchen levothyroxine (SYNTHROID, LEVOTHROID) 88 MCG tablet Take 1 tablet (88 mcg total) by mouth daily with breakfast. 90 tablet 3  . Lutein 20 MG CAPS Take by mouth. daily     . montelukast (SINGULAIR) 10 MG tablet TAKE ONE TABLET BY MOUTH AT BEDTIME 90 tablet 2  . Multiple Vitamin (MULTIVITAMIN) capsule Take 1 capsule by mouth daily.      . Pumpkin Seed-Soy Germ (AZO BLADDER CONTROL/GO-LESS PO) Take 310 mg by mouth 3 (three) times daily.    . TURMERIC PO Take 1 capsule by mouth daily.    . vitamin B-12 (CYANOCOBALAMIN) 500 MCG tablet Take 500 mcg by mouth daily.     No current facility-administered medications for this visit.    Allergies  Allergen Reactions  . Amlodipine Besylate     REACTION: muscle spasms, extremity swelling, low bp  . Amoxicillin   . Ciprofloxacin  REACTION: muscle spasms, insomnia  . Codeine     REACTION: hallucinations  . Penicillins   . Propoxyphene Hcl   . Sulfamethoxazole-Trimethoprim     REACTION: dizzy, could not focus eyes and could not concentrate and nausea    Family History  Problem Relation Age of Onset  . Parkinsonism Father   . Colon cancer Brother   . Kidney failure Son     congenital  . Allergies Mother   . Heart disease Mother     Social History   Social History  . Marital Status: Married    Spouse Name: N/A  . Number of Children: 2  . Years of Education: N/A   Occupational History  . TRAVEL AGENT    Social History Main Topics  . Smoking status: Never Smoker   . Smokeless tobacco: Never Used  . Alcohol Use: No     Comment: occasional-rare  . Drug Use: No  . Sexual Activity: No   Other Topics Concern  . Not on file   Social History Narrative   Non-smoker      occ alcohol       Married-husband does the cooking      Works outside the home     Constitutional: Positive for HA and facial pain. Denies fever, malaise, fatigue, or abrupt weight changes.  HEENT: Positive for nasal congestion. Denies eye pain, eye redness, ear pain, ringing in the ears, wax buildup, runny nose, bloody nose, or sore throat. Respiratory: Productive cough. Denies difficulty breathing, or shortness of breath Cardiovascular: Denies chest pain or chest tightness   No other specific complaints in a complete review of systems (except as listed in HPI above).     Objective:   Physical Exam BP 130/78 mmHg  Pulse 81  Temp(Src) 97.8 F (36.6 C) (Oral)  Wt 194 lb 8 oz (88.225 kg)  SpO2 94%  LMP 08/26/1969 Wt Readings from Last 3 Encounters:  12/07/15 194 lb 8 oz (88.225 kg)  11/06/15 191 lb 8 oz (86.864 kg)  11/06/15 191 lb 8 oz (86.864 kg)    General: Appears her stated age, well developed, well nourished in NAD. HEENT: Head: normal shape and size, no sinus tenderness noted; Eyes: sclera white, no icterus, conjunctiva pink; Ears: Tm's gray and intact, normal light reflex; Nose: mucosa boggy and moist, septum midline; Throat/Mouth: Teeth present, mucosa pink and moist, throat erythematous with no exudate, lesions or ulcerations noted.  Neck:  No lymphadenopathy noted. Cardiovascular: Normal rate and rhythm. S1,S2 noted.  No murmur, rubs or gallops noted.  Pulmonary/Chest: Normal effort and positive vesicular breath sounds. No respiratory distress. No wheezes, rales or ronchi noted.   BMET    Component Value Date/Time   NA 140 11/01/2015 1152   K 4.4 11/01/2015 1152   CL 104 11/01/2015 1152   CO2 28 11/01/2015 1152   GLUCOSE 90 11/01/2015 1152   BUN 19 11/01/2015 1152   CREATININE 0.97 11/01/2015 1152   CALCIUM 9.5 11/01/2015 1152   GFRNONAA >60 10/05/2009 1449   GFRAA  10/05/2009 1449    >60        The eGFR has been calculated using the MDRD equation. This calculation has  not been validated in all clinical situations. eGFR's persistently <60 mL/min signify possible Chronic Kidney Disease.    Lipid Panel     Component Value Date/Time   CHOL 200 11/01/2015 1152   TRIG 107.0 11/01/2015 1152   HDL 72.60 11/01/2015 1152   CHOLHDL 3 11/01/2015 1152  VLDL 21.4 11/01/2015 1152   LDLCALC 106* 11/01/2015 1152    CBC    Component Value Date/Time   WBC 7.4 11/01/2015 1152   RBC 4.52 11/01/2015 1152   HGB 14.8 11/01/2015 1152   HCT 44.3 11/01/2015 1152   PLT 261.0 11/01/2015 1152   MCV 97.8 11/01/2015 1152   MCH 31.1 04/30/2013 1434   MCHC 33.5 11/01/2015 1152   RDW 14.1 11/01/2015 1152   LYMPHSABS 3.3 11/01/2015 1152   MONOABS 0.6 11/01/2015 1152   EOSABS 0.3 11/01/2015 1152   BASOSABS 0.0 11/01/2015 1152    Hgb A1C Lab Results  Component Value Date   HGBA1C 5.3 11/01/2015          Assessment & Plan:   Viral sinusitis:  Continue Claritin, Flonase, Singular as prescribed 80 mg Depo IM today Return precautions given  RTC as needed or if sxs worsen

## 2015-12-07 NOTE — Patient Instructions (Signed)

## 2015-12-07 NOTE — Progress Notes (Signed)
Pre visit review using our clinic review tool, if applicable. No additional management support is needed unless otherwise documented below in the visit note. 

## 2015-12-11 ENCOUNTER — Encounter: Payer: Self-pay | Admitting: Family Medicine

## 2015-12-11 ENCOUNTER — Ambulatory Visit (INDEPENDENT_AMBULATORY_CARE_PROVIDER_SITE_OTHER)
Admission: RE | Admit: 2015-12-11 | Discharge: 2015-12-11 | Disposition: A | Discharge status: Home / Self Care | Payer: Medicare Other | Source: Ambulatory Visit | Attending: Family Medicine | Admitting: Family Medicine

## 2015-12-11 ENCOUNTER — Ambulatory Visit (INDEPENDENT_AMBULATORY_CARE_PROVIDER_SITE_OTHER): Payer: Medicare Other | Admitting: Family Medicine

## 2015-12-11 VITALS — BP 140/86 | HR 85 | Temp 98.2°F | Ht 65.0 in | Wt 190.2 lb

## 2015-12-11 DIAGNOSIS — J209 Acute bronchitis, unspecified: Secondary | ICD-10-CM | POA: Diagnosis not present

## 2015-12-11 DIAGNOSIS — R0602 Shortness of breath: Secondary | ICD-10-CM | POA: Diagnosis not present

## 2015-12-11 DIAGNOSIS — R05 Cough: Secondary | ICD-10-CM | POA: Diagnosis not present

## 2015-12-11 MED ORDER — HYDROCOD POLST-CPM POLST ER 10-8 MG/5ML PO SUER: 5.0000 mL | Freq: Two times a day (BID) | ORAL | Status: DC | PRN

## 2015-12-11 MED ORDER — PREDNISONE 50 MG PO TABS: ORAL_TABLET | ORAL | Status: DC

## 2015-12-11 NOTE — Progress Notes (Signed)
Subjective:  Patient ID: Linda Mcgrath, female    DOB: [birth year]  Age: 82 y.o. MRN: [id number]  CC: Cough, congestion  HPI:  82 year old female presents to clinic today with complaints of cough and congestion.  Patient states that she's been sick for the past 5 days. She's been experiencing cough and chest congestion. Symptoms are moderate to severe. She reports discolored sputum. No associated fevers or chills. She has had some shortness of breath. No known exacerbating or relieving factors. No other complaints at this time.  Social Hx   Social History   Social History  . Marital Status: Married    Spouse Name: N/A  . Number of Children: 2  . Years of Education: N/A   Occupational History  . TRAVEL AGENT    Social History Main Topics  . Smoking status: Never Smoker   . Smokeless tobacco: Never Used  . Alcohol Use: No     Comment: occasional-rare  . Drug Use: No  . Sexual Activity: No   Other Topics Concern  . None   Social History Narrative   Non-smoker      occ alcohol      Married-husband does the cooking      Works outside the home   Review of Systems  HENT: Positive for congestion.   Respiratory: Positive for cough and shortness of breath.    Objective:  BP 140/86 mmHg  Pulse 85  Temp(Src) 98.2 F (36.8 C) (Oral)  Ht 5\' 5"  (1.651 m)  Wt 190 lb 4 oz (86.297 kg)  BMI 31.66 kg/m2  SpO2 97%  LMP 08/26/1969  BP/Weight 12/11/2015 12/07/2015 11/06/2015  Systolic BP 140 130 122  Diastolic BP 86 78 80  Wt. (Lbs) 190.25 194.5 191.5  BMI 31.66 32.37 31.87   Physical Exam  Constitutional: She is oriented to person, place, and time. She appears well-developed. No distress.  HENT:  Mouth/Throat: Oropharynx is clear and moist.  Normal TM's bilaterally.   Cardiovascular: Normal rate and regular rhythm.   Pulmonary/Chest: Effort normal.  Left basilar wheezing.  Neurological: She is alert and oriented to person, place, and time.  Psychiatric: She  has a normal mood and affect.  Vitals reviewed.  Lab Results  Component Value Date   WBC 7.4 11/01/2015   HGB 14.8 11/01/2015   HCT 44.3 11/01/2015   PLT 261.0 11/01/2015   GLUCOSE 90 11/01/2015   CHOL 200 11/01/2015   TRIG 107.0 11/01/2015   HDL 72.60 11/01/2015   LDLDIRECT 125.7 06/09/2013   LDLCALC 106* 11/01/2015   ALT 15 11/01/2015   AST 16 11/01/2015   NA 140 11/01/2015   K 4.4 11/01/2015   CL 104 11/01/2015   CREATININE 0.97 11/01/2015   BUN 19 11/01/2015   CO2 28 11/01/2015   TSH 3.13 11/01/2015   HGBA1C 5.3 11/01/2015   Assessment & Plan:   Problem List Items Addressed This Visit    Acute bronchitis - Primary    New problem. History consistent with acute bronchitis. Given lung exam, chest x-ray was obtained. It was personally reviewed by me. Interpretation: No acute infiltrate.  Treating with prednisone and Tussionex.      Relevant Orders   DG Chest 2 View (Completed)      Meds ordered this encounter  Medications  . predniSONE (DELTASONE) 50 MG tablet    Sig: 1 tablet daily x 5 days.    Dispense:  5 tablet    Refill:  0  . chlorpheniramine-HYDROcodone (TUSSIONEX PENNKINETIC ER)  10-8 MG/5ML SUER    Sig: Take 5 mLs by mouth every 12 (twelve) hours as needed.    Dispense:  115 mL    Refill:  0   Follow-up: PRN  Everlene Other DO [facility]

## 2015-12-11 NOTE — Progress Notes (Signed)
Pre visit review using our clinic review tool, if applicable. No additional management support is needed unless otherwise documented below in the visit note. 

## 2015-12-11 NOTE — Patient Instructions (Signed)
Take the medication as prescribed.  We will call with the xray results.  Take care  Dr. Lacinda Axon

## 2015-12-11 NOTE — Assessment & Plan Note (Signed)
New problem. History consistent with acute bronchitis. Given lung exam, chest x-ray was obtained. It was personally reviewed by me. Interpretation: No acute infiltrate.  Treating with prednisone and Tussionex.

## 2015-12-12 ENCOUNTER — Other Ambulatory Visit: Payer: Self-pay | Admitting: Family Medicine

## 2015-12-12 DIAGNOSIS — Z79899 Other long term (current) drug therapy: Secondary | ICD-10-CM | POA: Diagnosis not present

## 2015-12-12 DIAGNOSIS — L438 Other lichen planus: Secondary | ICD-10-CM | POA: Diagnosis not present

## 2015-12-12 DIAGNOSIS — Z5181 Encounter for therapeutic drug level monitoring: Secondary | ICD-10-CM | POA: Diagnosis not present

## 2015-12-12 DIAGNOSIS — B370 Candidal stomatitis: Secondary | ICD-10-CM | POA: Diagnosis not present

## 2015-12-20 ENCOUNTER — Ambulatory Visit (INDEPENDENT_AMBULATORY_CARE_PROVIDER_SITE_OTHER): Payer: Medicare Other | Admitting: Internal Medicine

## 2015-12-20 ENCOUNTER — Ambulatory Visit: Payer: Medicare Other | Admitting: Family Medicine

## 2015-12-20 ENCOUNTER — Encounter: Payer: Self-pay | Admitting: Internal Medicine

## 2015-12-20 VITALS — BP 122/80 | HR 79 | Temp 97.6°F | Wt 189.0 lb

## 2015-12-20 DIAGNOSIS — J0140 Acute pansinusitis, unspecified: Secondary | ICD-10-CM | POA: Insufficient documentation

## 2015-12-20 MED ORDER — MONTELUKAST SODIUM 10 MG PO TABS: 10.0000 mg | ORAL_TABLET | Freq: Every day | ORAL | Status: DC

## 2015-12-20 MED ORDER — BENZONATATE 200 MG PO CAPS: 200.0000 mg | ORAL_CAPSULE | Freq: Three times a day (TID) | ORAL | Status: DC | PRN

## 2015-12-20 MED ORDER — CYCLOBENZAPRINE HCL 10 MG PO TABS: 10.0000 mg | ORAL_TABLET | Freq: Every evening | ORAL | Status: DC | PRN

## 2015-12-20 MED ORDER — AZITHROMYCIN 250 MG PO TABS: ORAL_TABLET | ORAL | Status: DC

## 2015-12-20 NOTE — Progress Notes (Signed)
Pre visit review using our clinic review tool, if applicable. No additional management support is needed unless otherwise documented below in the visit note. 

## 2015-12-20 NOTE — Progress Notes (Signed)
Subjective:    Patient ID: Linda Mcgrath, female    DOB: 03-08-1933, 82 y.o.   MRN: [id number]  HPI Here due to ongoing cough  Has not gotten better from last week Couldn't take the tussionex--- makes her "wild" (can't sleep etc) Now with lots of head congestion Bloody nasal drainage Lots of cough-- productive of dark phlegm (?PND) ---green at times (esp in AM)  No fever No chills or sweats Some sore throat at the beginning--better now Some left ear pain Bifrontal headache Has had some SOB--this is better (used left over albuterol)  Using mucinex for sinus-- seems to help  Current Outpatient Prescriptions on File Prior to Visit  Medication Sig Dispense Refill  . albuterol (PROVENTIL HFA;VENTOLIN HFA) 108 (90 BASE) MCG/ACT inhaler Inhale 2 puffs into the lungs every 4 (four) hours as needed for wheezing. 1 Inhaler 5  . Ascorbic Acid (VITAMIN C) 1000 MG tablet Take 1,000 mg by mouth daily.      . B Complex Vitamins (VITAMIN B COMPLEX PO) Take 1 tablet by mouth daily.    . Biotin 10 MG TABS Take by mouth. daily     . Calcium Carbonate-Vit D-Min 600-400 MG-UNIT TABS Take by mouth. daily     . chlorhexidine (PERIDEX) 0.12 % solution RINSE TWICE DAILY AFTER BRUSHING USING A CAPFUL AS MARKED    . clotrimazole (MYCELEX) 10 MG troche Take 10 mg by mouth daily.    . Cranberry 500 MG CAPS Take by mouth. daily    . cyclobenzaprine (FLEXERIL) 10 MG tablet Take 1 tablet (10 mg total) by mouth at bedtime as needed. 90 tablet 3  . estradiol (ESTRACE) 1 MG tablet Take 1 tablet (1 mg total) by mouth daily. 90 tablet 3  . fluticasone (FLONASE) 50 MCG/ACT nasal spray Place 1 spray into both nostrils daily as needed for allergies or rhinitis.    . folic acid (FOLVITE) 1 MG tablet Take 1 mg by mouth daily.    Marland Kitchen ibuprofen (ADVIL,MOTRIN) 200 MG tablet Take 400 mg by mouth 2 (two) times daily as needed.      Marland Kitchen levothyroxine (SYNTHROID, LEVOTHROID) 88 MCG tablet Take 1 tablet (88 mcg total) by  mouth daily with breakfast. 90 tablet 3  . Lutein 20 MG CAPS Take by mouth. daily     . montelukast (SINGULAIR) 10 MG tablet TAKE ONE TABLET BY MOUTH AT BEDTIME 90 tablet 2  . Multiple Vitamin (MULTIVITAMIN) capsule Take 1 capsule by mouth daily.      . Pumpkin Seed-Soy Germ (AZO BLADDER CONTROL/GO-LESS PO) Take 310 mg by mouth 3 (three) times daily.    . TURMERIC PO Take 1 capsule by mouth daily.    . vitamin B-12 (CYANOCOBALAMIN) 500 MCG tablet Take 500 mcg by mouth daily.     No current facility-administered medications on file prior to visit.    Allergies  Allergen Reactions  . Amlodipine Besylate     REACTION: muscle spasms, extremity swelling, low bp  . Amoxicillin   . Ciprofloxacin     REACTION: muscle spasms, insomnia  . Codeine     REACTION: hallucinations  . Penicillins   . Propoxyphene Hcl   . Sulfamethoxazole-Trimethoprim     REACTION: dizzy, could not focus eyes and could not concentrate and nausea    Past Medical History  Diagnosis Date  . Osteoarthritis   . Fibromyalgia   . Hypertension   . Hypothyroidism   . Hyperlipidemia   . Internal hemorrhoids   .  Colon polyp 1999    small polyp  . Diverticulosis   . Lung nodule     stable LUL 9 mm  . Cataract     Bil/lens implant  . Allergic rhinitis   . Leg cramps   . Menopausal syndrome   . UTI (urinary tract infection)     Past Surgical History  Procedure Laterality Date  . Abdominal hysterectomy  1975    total-fibroids  . Rotator cuff repair      x 2 /right shoulder  . Skin cancer excision      pre-melanoma / on face  . Breast biopsy      benign  . Cataract extraction      bilateral  . Abd u/s  11/1998    negative  . Abd u/s  01/2001    gallbladder polyps  . Hida scan  01/2001    Negative  . Colonoscopy  1998    Diverticulosis; polyp\  . Dexa  10/2001    osteopenia  . Esophagogastroduodenoscopy  2004  . Colonoscopy  09/2002    Diverticulosis, hem  . Colonoscopy  12/2007    diverticulosis,  polyp  . Appendectomy  1975  . Cholecystectomy    . Knee surgery      right knee / 11/2004  . Laser surgery for glaucoma Left Sep 21, 2014    Family History  Problem Relation Age of Onset  . Parkinsonism Father   . Colon cancer Brother   . Kidney failure Son     congenital  . Allergies Mother   . Heart disease Mother     Social History   Social History  . Marital Status: Married    Spouse Name: N/A  . Number of Children: 2  . Years of Education: N/A   Occupational History  . TRAVEL AGENT    Social History Main Topics  . Smoking status: Never Smoker   . Smokeless tobacco: Never Used  . Alcohol Use: No     Comment: occasional-rare  . Drug Use: No  . Sexual Activity: No   Other Topics Concern  . Not on file   Social History Narrative   Non-smoker      occ alcohol      Married-husband does the cooking      Works outside the home   Review of Systems  No rash Lots of gas and loose bowels Chest is sore from the coughing No vomiting Appetite is okay     Objective:   Physical Exam  Constitutional: She appears well-developed and well-nourished. No distress.  HENT:  Mouth/Throat: Oropharynx is clear and moist. No oropharyngeal exudate.  Mild frontal and maxillary tenderness--more frontal Mild nasal inflammation TMs normal  Neck: Normal range of motion. Neck supple.  Pulmonary/Chest: Effort normal and breath sounds normal. No respiratory distress. She has no wheezes. She has no rales.  Lymphadenopathy:    She has no cervical adenopathy.          Assessment & Plan:

## 2015-12-20 NOTE — Assessment & Plan Note (Signed)
Sick for ~2 weeks and now worse Will try z-pak due to her allergies benzonatate

## 2016-01-08 DIAGNOSIS — Z885 Allergy status to narcotic agent status: Secondary | ICD-10-CM | POA: Diagnosis not present

## 2016-01-08 DIAGNOSIS — Z882 Allergy status to sulfonamides status: Secondary | ICD-10-CM | POA: Diagnosis not present

## 2016-01-08 DIAGNOSIS — Z79899 Other long term (current) drug therapy: Secondary | ICD-10-CM | POA: Diagnosis not present

## 2016-01-08 DIAGNOSIS — Z888 Allergy status to other drugs, medicaments and biological substances status: Secondary | ICD-10-CM | POA: Diagnosis not present

## 2016-01-08 DIAGNOSIS — Z881 Allergy status to other antibiotic agents status: Secondary | ICD-10-CM | POA: Diagnosis not present

## 2016-01-08 DIAGNOSIS — L438 Other lichen planus: Secondary | ICD-10-CM | POA: Diagnosis not present

## 2016-01-08 DIAGNOSIS — Z880 Allergy status to penicillin: Secondary | ICD-10-CM | POA: Diagnosis not present

## 2016-03-04 ENCOUNTER — Ambulatory Visit (INDEPENDENT_AMBULATORY_CARE_PROVIDER_SITE_OTHER): Payer: Medicare Other | Admitting: Family Medicine

## 2016-03-04 ENCOUNTER — Encounter: Payer: Self-pay | Admitting: Family Medicine

## 2016-03-04 ENCOUNTER — Ambulatory Visit (HOSPITAL_COMMUNITY)
Admission: RE | Admit: 2016-03-04 | Discharge: 2016-03-04 | Disposition: A | Discharge status: Home / Self Care | Payer: Medicare Other | Source: Ambulatory Visit | Attending: Family Medicine | Admitting: Family Medicine

## 2016-03-04 ENCOUNTER — Telehealth: Payer: Self-pay

## 2016-03-04 ENCOUNTER — Ambulatory Visit (INDEPENDENT_AMBULATORY_CARE_PROVIDER_SITE_OTHER)
Admission: RE | Admit: 2016-03-04 | Discharge: 2016-03-04 | Disposition: A | Discharge status: Home / Self Care | Payer: Medicare Other | Source: Ambulatory Visit | Attending: Family Medicine | Admitting: Family Medicine

## 2016-03-04 VITALS — BP 126/80 | HR 117 | Temp 97.4°F | Wt 193.2 lb

## 2016-03-04 DIAGNOSIS — I483 Typical atrial flutter: Secondary | ICD-10-CM | POA: Diagnosis not present

## 2016-03-04 DIAGNOSIS — M47812 Spondylosis without myelopathy or radiculopathy, cervical region: Secondary | ICD-10-CM | POA: Diagnosis not present

## 2016-03-04 DIAGNOSIS — R51 Headache: Principal | ICD-10-CM

## 2016-03-04 DIAGNOSIS — E785 Hyperlipidemia, unspecified: Secondary | ICD-10-CM | POA: Diagnosis not present

## 2016-03-04 DIAGNOSIS — N951 Menopausal and female climacteric states: Secondary | ICD-10-CM | POA: Diagnosis not present

## 2016-03-04 DIAGNOSIS — R000 Tachycardia, unspecified: Secondary | ICD-10-CM | POA: Diagnosis not present

## 2016-03-04 DIAGNOSIS — G319 Degenerative disease of nervous system, unspecified: Secondary | ICD-10-CM | POA: Insufficient documentation

## 2016-03-04 DIAGNOSIS — R202 Paresthesia of skin: Secondary | ICD-10-CM

## 2016-03-04 DIAGNOSIS — R519 Headache, unspecified: Secondary | ICD-10-CM

## 2016-03-04 DIAGNOSIS — R0602 Shortness of breath: Secondary | ICD-10-CM | POA: Diagnosis not present

## 2016-03-04 DIAGNOSIS — J90 Pleural effusion, not elsewhere classified: Secondary | ICD-10-CM | POA: Diagnosis not present

## 2016-03-04 DIAGNOSIS — R05 Cough: Secondary | ICD-10-CM | POA: Diagnosis not present

## 2016-03-04 DIAGNOSIS — M797 Fibromyalgia: Secondary | ICD-10-CM | POA: Diagnosis not present

## 2016-03-04 DIAGNOSIS — E039 Hypothyroidism, unspecified: Secondary | ICD-10-CM | POA: Diagnosis not present

## 2016-03-04 DIAGNOSIS — I484 Atypical atrial flutter: Secondary | ICD-10-CM | POA: Diagnosis not present

## 2016-03-04 MED ORDER — GABAPENTIN 100 MG PO CAPS: 100.0000 mg | ORAL_CAPSULE | Freq: Three times a day (TID) | ORAL | Status: DC | PRN

## 2016-03-04 NOTE — Telephone Encounter (Signed)
CT with [facility] called report for CT of head without contrast; report in Epic; walked report to Dr Damita Dunnings; pt has already gone home.

## 2016-03-04 NOTE — Telephone Encounter (Signed)
See result note.  Thanks!

## 2016-03-04 NOTE — Assessment & Plan Note (Signed)
New problem, needs w/u. Resolved now but this was atypical for her.  And in the meantime with paresthesias w/o rash on the B upper torso.  No weakness.  No emergent sx that would direct ER eval at this point.  D/w PCP at [facility].  Would still check CT head and plain films of neck.  Patient agrees with plan.   I am not concerned for intrathoracic cause of her pain.  She clearly had changes in sensation and pain with light touch suggesting cutaneous nerve irritation.   Atypical for zoster.  Reasonable to try gabapentin with routine cautions.  She agrees.  See AVS.  See notes on imaging reports.

## 2016-03-04 NOTE — Patient Instructions (Signed)
Go to the lab on the way out.  We'll contact you with your xray report. Rosaria Ferries will call about your referral, see her after the xray.  We'll be in touch.  Start gabapentin for the pain.  Start with 1 a day and gradually increase the dose up to 1 pill 3 times as day if needed and tolerated.  Take care.  Glad to see you.  Update [place] as needed.

## 2016-03-04 NOTE — Progress Notes (Signed)
Pre visit review using our clinic review tool, if applicable. No additional management support is needed unless otherwise documented below in the visit note.  Sx started about 3 days ago.  She had a severe HA initially, frontal.  Then had pain in back and B arms.  She felt like "I was on fire".  She didn't sleep much at all Friday night.  Not as bad today. Still with a hot sensation of pain, but some better in the meantime.  Now with pain with a deep breath along the B anterior chest wall.  No rash seen by patient, she couldn't check her back well.     She couldn't wear a bra due to pain.  Took ibuprofen in the meantime.  Clearly if something just brushed against the skin she would have more pain.  No HA now.  No vision change, no fevers.    PMH and SH reviewed  ROS: Per HPI unless specifically indicated in ROS section   Meds, vitals, and allergies reviewed.   GEN: nad, alert and oriented HEENT: mucous membranes moist NECK: supple w/o LA CV: rrr.   PULM: ctab, no inc wob ABD: soft, +bs EXT: no edema SKIN: no rash CN 2-12 wnl B, S/S except for dec/change sensation in L upper back, with light touch.   Anterior chest wall ttp B w/o rash or bruising.

## 2016-03-06 ENCOUNTER — Encounter: Payer: Self-pay | Admitting: Family Medicine

## 2016-03-06 ENCOUNTER — Ambulatory Visit (INDEPENDENT_AMBULATORY_CARE_PROVIDER_SITE_OTHER): Payer: Medicare Other | Admitting: Family Medicine

## 2016-03-06 ENCOUNTER — Telehealth: Payer: Self-pay | Admitting: Family Medicine

## 2016-03-06 ENCOUNTER — Emergency Department (HOSPITAL_COMMUNITY): Payer: Medicare Other

## 2016-03-06 ENCOUNTER — Inpatient Hospital Stay (HOSPITAL_COMMUNITY)
Admission: EM | Admit: 2016-03-06 | Discharge: 2016-03-08 | DRG: 309 | Disposition: A | Discharge status: Home / Self Care | Payer: Medicare Other | Source: Ambulatory Visit | Attending: Cardiology | Admitting: Cardiology

## 2016-03-06 ENCOUNTER — Observation Stay (HOSPITAL_COMMUNITY): Payer: Medicare Other

## 2016-03-06 ENCOUNTER — Encounter (HOSPITAL_COMMUNITY): Payer: Self-pay | Admitting: Adult Health

## 2016-03-06 VITALS — BP 130/88 | HR 100 | Temp 97.9°F | Ht 65.0 in | Wt 194.0 lb

## 2016-03-06 DIAGNOSIS — R000 Tachycardia, unspecified: Secondary | ICD-10-CM

## 2016-03-06 DIAGNOSIS — E785 Hyperlipidemia, unspecified: Secondary | ICD-10-CM | POA: Diagnosis present

## 2016-03-06 DIAGNOSIS — I483 Typical atrial flutter: Principal | ICD-10-CM | POA: Diagnosis present

## 2016-03-06 DIAGNOSIS — E7800 Pure hypercholesterolemia, unspecified: Secondary | ICD-10-CM | POA: Diagnosis not present

## 2016-03-06 DIAGNOSIS — I480 Paroxysmal atrial fibrillation: Secondary | ICD-10-CM | POA: Diagnosis not present

## 2016-03-06 DIAGNOSIS — J90 Pleural effusion, not elsewhere classified: Secondary | ICD-10-CM | POA: Diagnosis present

## 2016-03-06 DIAGNOSIS — M797 Fibromyalgia: Secondary | ICD-10-CM | POA: Diagnosis present

## 2016-03-06 DIAGNOSIS — E039 Hypothyroidism, unspecified: Secondary | ICD-10-CM | POA: Diagnosis present

## 2016-03-06 DIAGNOSIS — R0602 Shortness of breath: Secondary | ICD-10-CM | POA: Diagnosis not present

## 2016-03-06 DIAGNOSIS — I484 Atypical atrial flutter: Secondary | ICD-10-CM | POA: Diagnosis not present

## 2016-03-06 DIAGNOSIS — I4891 Unspecified atrial fibrillation: Secondary | ICD-10-CM | POA: Diagnosis present

## 2016-03-06 DIAGNOSIS — R05 Cough: Secondary | ICD-10-CM | POA: Diagnosis not present

## 2016-03-06 DIAGNOSIS — Z882 Allergy status to sulfonamides status: Secondary | ICD-10-CM

## 2016-03-06 DIAGNOSIS — I4589 Other specified conduction disorders: Secondary | ICD-10-CM | POA: Diagnosis present

## 2016-03-06 DIAGNOSIS — Z8679 Personal history of other diseases of the circulatory system: Secondary | ICD-10-CM | POA: Diagnosis present

## 2016-03-06 DIAGNOSIS — Z880 Allergy status to penicillin: Secondary | ICD-10-CM

## 2016-03-06 DIAGNOSIS — N951 Menopausal and female climacteric states: Secondary | ICD-10-CM | POA: Diagnosis present

## 2016-03-06 DIAGNOSIS — Z888 Allergy status to other drugs, medicaments and biological substances status: Secondary | ICD-10-CM

## 2016-03-06 DIAGNOSIS — I10 Essential (primary) hypertension: Secondary | ICD-10-CM | POA: Diagnosis present

## 2016-03-06 LAB — CBC
HCT: 41.5 % (ref 36.0–46.0)
Hemoglobin: 14.2 g/dL (ref 12.0–15.0)
MCH: 33.1 pg (ref 26.0–34.0)
MCHC: 34.2 g/dL (ref 30.0–36.0)
MCV: 96.7 fL (ref 78.0–100.0)
Platelets: 360 10*3/uL (ref 150–400)
RBC: 4.29 MIL/uL (ref 3.87–5.11)
RDW: 13.7 % (ref 11.5–15.5)
WBC: 9.5 10*3/uL (ref 4.0–10.5)

## 2016-03-06 LAB — HEPATIC FUNCTION PANEL
ALT: 51 U/L (ref 14–54)
AST: 66 U/L — ABNORMAL HIGH (ref 15–41)
Albumin: 2.9 g/dL — ABNORMAL LOW (ref 3.5–5.0)
Alkaline Phosphatase: 92 U/L (ref 38–126)
Bilirubin, Direct: 0.2 mg/dL (ref 0.1–0.5)
Indirect Bilirubin: 0.7 mg/dL (ref 0.3–0.9)
Total Bilirubin: 0.9 mg/dL (ref 0.3–1.2)
Total Protein: 6.4 g/dL — ABNORMAL LOW (ref 6.5–8.1)

## 2016-03-06 LAB — BASIC METABOLIC PANEL
Anion gap: 11 (ref 5–15)
BUN: 17 mg/dL (ref 6–20)
CO2: 21 mmol/L — ABNORMAL LOW (ref 22–32)
Calcium: 9 mg/dL (ref 8.9–10.3)
Chloride: 103 mmol/L (ref 101–111)
Creatinine, Ser: 1.01 mg/dL — ABNORMAL HIGH (ref 0.44–1.00)
GFR calc Af Amer: 59 mL/min — ABNORMAL LOW (ref >60)
GFR calc non Af Amer: 51 mL/min — ABNORMAL LOW (ref >60)
Glucose, Bld: 96 mg/dL (ref 65–99)
Potassium: 3.8 mmol/L (ref 3.5–5.1)
Sodium: 135 mmol/L (ref 135–145)

## 2016-03-06 LAB — D-DIMER, QUANTITATIVE: D-Dimer, Quant: 3.92 ug/mL-FEU — ABNORMAL HIGH (ref 0.00–0.50)

## 2016-03-06 LAB — MAGNESIUM: Magnesium: 2.1 mg/dL (ref 1.7–2.4)

## 2016-03-06 MED ORDER — ADENOSINE 6 MG/2ML IV SOLN
12.0000 mg | Freq: Once | INTRAVENOUS | Status: AC
  Administered 2016-03-06: 19:00:00 via INTRAVENOUS

## 2016-03-06 MED ORDER — METOPROLOL TARTRATE 5 MG/5ML IV SOLN
5.0000 mg | Freq: Four times a day (QID) | INTRAVENOUS | Status: DC
  Administered 2016-03-06 – 2016-03-07 (×3): 5 mg via INTRAVENOUS
  Filled 2016-03-06 (×3): qty 5

## 2016-03-06 MED ORDER — IOPAMIDOL (ISOVUE-370) INJECTION 76%
INTRAVENOUS | Status: AC
  Administered 2016-03-06: 100 mL
  Filled 2016-03-06: qty 100

## 2016-03-06 MED ORDER — METOPROLOL TARTRATE 5 MG/5ML IV SOLN
5.0000 mg | Freq: Once | INTRAVENOUS | Status: AC
  Administered 2016-03-06: 5 mg via INTRAVENOUS
  Filled 2016-03-06: qty 5

## 2016-03-06 MED ORDER — ACETAMINOPHEN 325 MG PO TABS
650.0000 mg | ORAL_TABLET | ORAL | Status: DC | PRN
  Administered 2016-03-07: 650 mg via ORAL
  Filled 2016-03-06: qty 2

## 2016-03-06 MED ORDER — HEPARIN (PORCINE) IN NACL 100-0.45 UNIT/ML-% IJ SOLN
1300.0000 [IU]/h | INTRAMUSCULAR | Status: DC
  Administered 2016-03-06: 1000 [IU]/h via INTRAVENOUS
  Administered 2016-03-07 (×2): 1300 [IU]/h via INTRAVENOUS
  Filled 2016-03-06 (×2): qty 250

## 2016-03-06 MED ORDER — DILTIAZEM LOAD VIA INFUSION
15.0000 mg | Freq: Once | INTRAVENOUS | Status: AC
  Administered 2016-03-06: 15 mg via INTRAVENOUS
  Filled 2016-03-06: qty 15

## 2016-03-06 MED ORDER — ADENOSINE 6 MG/2ML IV SOLN
INTRAVENOUS | Status: AC
  Filled 2016-03-06: qty 4

## 2016-03-06 MED ORDER — ONDANSETRON HCL 4 MG/2ML IJ SOLN: 4.0000 mg | Freq: Four times a day (QID) | INTRAMUSCULAR | Status: DC | PRN

## 2016-03-06 MED ORDER — DILTIAZEM HCL 100 MG IV SOLR
5.0000 mg/h | INTRAVENOUS | Status: DC
  Administered 2016-03-06: 5 mg/h via INTRAVENOUS
  Administered 2016-03-06 – 2016-03-07 (×3): 15 mg/h via INTRAVENOUS
  Filled 2016-03-06 (×3): qty 100

## 2016-03-06 MED ORDER — HEPARIN BOLUS VIA INFUSION
4000.0000 [IU] | Freq: Once | INTRAVENOUS | Status: AC
Start: 2016-03-06 — End: 2016-03-06
  Administered 2016-03-06: 4000 [IU] via INTRAVENOUS
  Filled 2016-03-06: qty 4000

## 2016-03-06 NOTE — ED Notes (Signed)
Sent from Dr. Kelby Aline c/o SOB since this weekend, seen Monday as well for back pain. SOB on exertion, 92% RA.  Denies pain at this time. 500 ml of fluid in by EMS. HR 152-EKG shows flutter. pty ia alert, oreinetd, smiling, denies dizziness. BEgan new medication-neurotin Monday.

## 2016-03-06 NOTE — Telephone Encounter (Signed)
It looks like she has appt with Dr Damita Dunnings at 2:15 for a visit - I will check in with him regarding this

## 2016-03-06 NOTE — Telephone Encounter (Signed)
Patient Name: [facility] MARTIN-HIGGINS  DOB: 1933-02-03    Initial Comment Caller having nerve pain in her back. was seen Monday. breathing is shallow and can't take a deep breath   Nurse Assessment  Nurse: Raphael Gibney, RN, Vanita Ingles Date/Time (Eastern Time): 03/06/2016 12:28:21 PM  Confirm and document reason for call. If symptomatic, describe symptoms. You must click the next button to save text entered. ---Caller states she was seen on Monday for back pain due to nerve pain. She is having SOB but not severe. her breathing is shallow. She can not take a deep breath. No fever. breathing is about the same as Monday.  Has the patient traveled out of the country within the last 30 days? ---Not Applicable  Does the patient have any new or worsening symptoms? ---Yes  Will a triage be completed? ---Yes  Related visit to physician within the last 2 weeks? ---Yes  Does the PT have any chronic conditions? (i.e. diabetes, asthma, etc.) ---No  Is this a behavioral health or substance abuse call? ---No     Guidelines    Guideline Title Affirmed Question Affirmed Notes  Breathing Difficulty [1] MILD difficulty breathing (e.g., minimal/no SOB at rest, SOB with walking, pulse <100) AND [2] NEW-onset or WORSE than normal    Final Disposition User   Go to ED Now (or PCP triage) Raphael Gibney, RN, Vera    Comments  Triage outcome upgraded to go to ER (or PCP triage) due to SOB and inability to take a deep breath  Office is open. Appt scheduled for 2:15 pm 03/06/16 with Dr. Damita Dunnings rather than sending pt to the ER.   Referrals  REFERRED TO PCP OFFICE   Disagree/Comply: Comply

## 2016-03-06 NOTE — Progress Notes (Signed)
ANTICOAGULATION CONSULT NOTE - Initial Consult  Pharmacy Consult for heparin  Indication: atrial fibrillation  Allergies  Allergen Reactions  . Shellfish Allergy Anaphylaxis and Nausea And Vomiting  . Tetracycline Hives, Nausea And Vomiting, Rash and Other (See Comments)    SYSTEMIC REACTION: heart palpitations,muscle spasms, vomiting  . Amlodipine Besylate     REACTION: muscle spasms, extremity swelling, low bp  . Ciprofloxacin     REACTION: muscle spasms, insomnia  . Codeine     REACTION: hallucinations  . Propoxyphene Hcl   . Sulfamethoxazole-Trimethoprim Nausea Only and Other (See Comments)    REACTION: dizzy, could not focus eyes and could not concentrate and nausea  . Tape Other (See Comments)    BANDAIDS TAKE OFF HER SKIN; PLEASE USE COBAN OR PAPER   . Amoxicillin Rash  . 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 Penicillins Swelling and Rash    Has patient had a PCN reaction causing immediate rash, facial/tongue/throat swelling, SOB or lightheadedness with hypotension: Yes Has patient had a PCN reaction causing severe rash involving mucus membranes or skin necrosis: No Has patient had a PCN reaction that required hospitalization No Has patient had a PCN reaction occurring within the last 10 years: No If all of the above answers are "NO", then may proceed with Cephalosporin use.     Patient Measurements: Weight: 194 lb (87.998 kg) Heparin Dosing Weight: 76  Vital Signs: Temp: 98.2 F (36.8 C) (07/12 1617) Temp Source: Oral (07/12 1414) BP: 106/91 mmHg (07/12 1815) Pulse Rate: 154 (07/12 1815)  Labs:  Recent Labs  03/06/16 1633  HGB 14.2  HCT 41.5  PLT 360  CREATININE 1.01*    Estimated Creatinine Clearance: 48.7 mL/min (by C-G formula based on Cr of 1.01).   Medical History: Past  Medical History  Diagnosis Date  . Osteoarthritis   . Fibromyalgia   . Hypertension   . Hypothyroidism   . Hyperlipidemia   . Internal hemorrhoids   . Colon polyp 1999    small polyp  . Diverticulosis   . Lung nodule     stable LUL 9 mm  . Cataract     Bil/lens implant  . Allergic rhinitis   . Leg cramps   . Menopausal syndrome   . UTI (urinary tract infection)     Assessment: 82 yo F admitted with new onset atrial fibrillation. Pharmacy to dose heparin. No known anticoagulation PTA. CBC stable.   Goal of Therapy:  Heparin level 0.3-0.7 units/ml Monitor platelets by anticoagulation protocol: Yes   Plan:  1. Give 4000 units bolus x 1 2. Start heparin infusion at 1000 units/hr 3. Check anti-Xa level in 8 hours and daily while on heparin 4. Continue to monitor H&H and platelets  Vincenza Hews, PharmD, BCPS 03/06/2016, 7:24 PM Pager: [phone]

## 2016-03-06 NOTE — Assessment & Plan Note (Addendum)
She had a mild tachycardia when I checked her 2 days ago, but that was attributed to pain at the time.  She is clearly more tachy today, up to 157-160 on my exam and rechecked with monitor, which concurred.   RN on check in had noted variable pulse rates, not irregular but was noted to be faster initially (~120) then down to 100 on manual check.   EKG reviewed.  dw pt, EMS called. Transport to ER pending.   She still has some change in sensation on the upper back, that is bilateral, some better than Monday.  At this point, her prev complaint of change in sensation in the upper chest wall appears to be unrelated to the tachycardia.   >25 minutes spent in face to face time with patient, >50% spent in counselling or coordination of care.

## 2016-03-06 NOTE — Telephone Encounter (Signed)
Patient wanted to let the provider know that her breathing is very shallow and she cannot take a deep breath. (Also sent her to the team health)

## 2016-03-06 NOTE — ED Provider Notes (Signed)
CSN: [place]:[id number]     Arrival date & time 03/06/16  1611 History   First MD Initiated Contact with Patient 03/06/16 1619     Chief Complaint  Patient presents with  . Tachycardia     (Consider location/radiation/quality/duration/timing/severity/associated sxs/prior Treatment) HPI   Back pain started last Friday--went to doctor on Monday, back and front pain, pressure, felt like nerves and electricity, through the upper back, and bilateral shoulders.  mild shortness of breath, but pain so intense did not feel it, pain improved, but SOB did not.  Monday after appt, developd more noticeable SOB \\then  went to Dr. Today for dyspnea. No orthopnea, no sig cough. No CP/chills/vomiting/palpitations/no arm/leg pain/no urinary symptoms  Thyroid medication, taking as rx, no changes No hx of smoking, no hx of arrhythmia Past Medical History  Diagnosis Date  . Osteoarthritis   . Fibromyalgia   . Hypertension   . Hypothyroidism   . Hyperlipidemia   . Internal hemorrhoids   . Colon polyp 1999    small polyp  . Diverticulosis   . Lung nodule     stable LUL 9 mm  . Cataract     Bil/lens implant  . Allergic rhinitis   . Leg cramps   . Menopausal syndrome   . UTI (urinary tract infection)    Past Surgical History  Procedure Laterality Date  . Abdominal hysterectomy  1975    total-fibroids  . Rotator cuff repair      x 2 /right shoulder  . Skin cancer excision      pre-melanoma / on face  . Breast biopsy      benign  . Cataract extraction      bilateral  . Abd u/s  11/1998    negative  . Abd u/s  01/2001    gallbladder polyps  . Hida scan  01/2001    Negative  . Colonoscopy  1998    Diverticulosis; polyp\  . Dexa  10/2001    osteopenia  . Esophagogastroduodenoscopy  2004  . Colonoscopy  09/2002    Diverticulosis, hem  . Colonoscopy  12/2007    diverticulosis, polyp  . Appendectomy  1975  . Cholecystectomy    . Knee surgery      right knee / 11/2004  . Laser surgery for  glaucoma Left Sep 21, 2014   Family History  Problem Relation Age of Onset  . Parkinsonism Father   . Colon cancer Brother   . Kidney failure Son     congenital  . Allergies Mother   . Heart disease Mother    Social History  Substance Use Topics  . Smoking status: Never Smoker   . Smokeless tobacco: Never Used  . Alcohol Use: No     Comment: occasional-rare   OB History    No data available     Review of Systems  Constitutional: Negative for fever.  HENT: Negative for sore throat.   Eyes: Negative for visual disturbance.  Respiratory: Positive for shortness of breath. Negative for cough.   Cardiovascular: Negative for chest pain.  Gastrointestinal: Negative for nausea, vomiting and abdominal pain.  Genitourinary: Negative for difficulty urinating.  Musculoskeletal: Positive for back pain. Negative for neck pain.  Skin: Negative for rash.  Neurological: Negative for syncope and headaches.      Allergies  Shellfish allergy; Tetracycline; Amlodipine besylate; Ciprofloxacin; Codeine; Propoxyphene hcl; Sulfamethoxazole-trimethoprim; Tape; Amoxicillin; Other; and Penicillins  Home Medications   Prior to Admission medications   Medication Sig Start Date End  Date Taking? Authorizing Provider  albuterol (PROVENTIL HFA;VENTOLIN HFA) 108 (90 BASE) MCG/ACT inhaler Inhale 2 puffs into the lungs every 4 (four) hours as needed for wheezing. 05/16/14  Yes Abner Greenspan, MD  Ascorbic Acid (VITAMIN C) 1000 MG tablet Take 1,000 mg by mouth daily.     Yes Historical Provider, MD  B Complex Vitamins (VITAMIN B COMPLEX PO) Take 1 tablet by mouth daily.   Yes Historical Provider, MD  Biotin 10 MG TABS Take 1 tablet by mouth every morning.    Yes Historical Provider, MD  Calcium Carbonate-Vit D-Min 600-400 MG-UNIT TABS Take 1 tablet by mouth every morning.    Yes Historical Provider, MD  chlorhexidine (PERIDEX) 0.12 % solution Use as directed 5-15 mLs in the mouth or throat daily as needed  (for breakouts). Reported on 03/06/2016 04/28/13  Yes Historical Provider, MD  Cranberry 500 MG CAPS Take 1 capsule by mouth daily.    Yes Historical Provider, MD  cyclobenzaprine (FLEXERIL) 10 MG tablet Take 1 tablet (10 mg total) by mouth at bedtime as needed. 12/20/15  Yes Venia Carbon, MD  estradiol (ESTRACE) 1 MG tablet Take 1 tablet (1 mg total) by mouth daily. 11/06/15  Yes Abner Greenspan, MD  fluticasone (FLONASE) 50 MCG/ACT nasal spray Place 1 spray into both nostrils daily as needed for allergies or rhinitis.   Yes Historical Provider, MD  folic acid (FOLVITE) 1 MG tablet Take 1 mg by mouth daily.   Yes Historical Provider, MD  gabapentin (NEURONTIN) 100 MG capsule Take 1 capsule (100 mg total) by mouth 3 (three) times daily as needed. 03/04/16  Yes Tonia Ghent, MD  ibuprofen (ADVIL,MOTRIN) 200 MG tablet Take 400 mg by mouth at bedtime. For leg pain/discomfort   Yes Historical Provider, MD  levothyroxine (SYNTHROID, LEVOTHROID) 88 MCG tablet Take 1 tablet (88 mcg total) by mouth daily with breakfast. 11/06/15  Yes Abner Greenspan, MD  Lutein 20 MG CAPS Take 1 capsule by mouth daily.    Yes Historical Provider, MD  methotrexate (RHEUMATREX) 2.5 MG tablet Take 7.5 mg by mouth every 7 (seven) days. [facility]) 01/08/16  Yes Historical Provider, MD  montelukast (SINGULAIR) 10 MG tablet Take 1 tablet (10 mg total) by mouth at bedtime. 12/20/15  Yes Venia Carbon, MD  Multiple Vitamin (MULTIVITAMIN) capsule Take 1 capsule by mouth daily.     Yes Historical Provider, MD  Pumpkin Seed-Soy Germ (AZO BLADDER CONTROL/GO-LESS PO) Take 310 mg by mouth 3 (three) times daily.   Yes Historical Provider, MD  TURMERIC PO Take 1 capsule by mouth daily.   Yes Historical Provider, MD  vitamin B-12 (CYANOCOBALAMIN) 500 MCG tablet Take 500 mcg by mouth daily.   Yes Historical Provider, MD   BP 110/60 mmHg  Pulse 68  Temp(Src) 97.8 F (36.6 C) (Oral)  Resp 18  Ht 5\' 5"  (1.651 m)  Wt 195 lb 1.6 oz (88.497  kg)  BMI 32.47 kg/m2  SpO2 94%  LMP 08/26/1969 Physical Exam  Constitutional: She is oriented to person, place, and time. She appears well-developed and well-nourished. No distress.  HENT:  Head: Normocephalic and atraumatic.  Eyes: Conjunctivae and EOM are normal.  Neck: Normal range of motion.  Cardiovascular: Regular rhythm, normal heart sounds and intact distal pulses.  Tachycardia present.  Exam reveals no gallop and no friction rub.   No murmur heard. Pulmonary/Chest: Effort normal. No respiratory distress. She has no wheezes. She has rales (LLL).  Abdominal: Soft. She exhibits  no distension. There is no tenderness. There is no guarding.  Musculoskeletal: She exhibits no edema.       Thoracic back: She exhibits tenderness.  Neurological: She is alert and oriented to person, place, and time.  Skin: Skin is warm and dry. No rash noted. She is not diaphoretic. No erythema.  Nursing note and vitals reviewed.   ED Course  Procedures (including critical care time) Labs Review Labs Reviewed  BASIC METABOLIC PANEL - Abnormal; Notable for the following:    CO2 21 (*)    Creatinine, Ser 1.01 (*)    GFR calc non Af Amer 51 (*)    GFR calc Af Amer 59 (*)    All other components within normal limits  HEPATIC FUNCTION PANEL - Abnormal; Notable for the following:    Total Protein 6.4 (*)    Albumin 2.9 (*)    AST 66 (*)    All other components within normal limits  D-DIMER, QUANTITATIVE (NOT AT [facility]) - Abnormal; Notable for the following:    D-Dimer, Quant 3.92 (*)    All other components within normal limits  HEPARIN LEVEL (UNFRACTIONATED) - Abnormal; Notable for the following:    Heparin Unfractionated <0.10 (*)    All other components within normal limits  MRSA PCR SCREENING  CBC  MAGNESIUM  HEPARIN LEVEL (UNFRACTIONATED)  CBC  BASIC METABOLIC PANEL    Imaging Review Ct Angio Chest Pe W/cm &/or Wo Cm  03/06/2016  CLINICAL DATA:  Friday last week patient developed severe  headaches, shortness of breath, and sharp chest and back pain. Patient saw primary care physician on Monday and had a CT scan. Patient's heart rate is increasing and she is sent to the ED for further evaluation. Atrial fibrillation. Elevated D-dimer. EXAM: CT ANGIOGRAPHY CHEST WITH CONTRAST TECHNIQUE: Multidetector CT imaging of the chest was performed using the standard protocol during bolus administration of intravenous contrast. Multiplanar CT image reconstructions and MIPs were obtained to evaluate the vascular anatomy. CONTRAST:  65 mL Isovue 370 COMPARISON:  None. FINDINGS: Technically adequate examination with good opacification of the central and segmental pulmonary arteries. No focal filling defects are demonstrated. No evidence of significant pulmonary embolus. Mild cardiac enlargement. Small pericardial effusion. Coronary artery calcifications. Normal caliber thoracic aorta. Scattered lymph nodes in the mediastinum are not pathologically enlarged. Largest pretracheal lymph node measures about 10 mm short axis dimension. Esophagus is decompressed. Small bilateral pleural effusions, greater on the left. Consolidation or atelectasis in the left lung base. Solid circumscribed noncalcified nodule in the left upper lung measuring 9 mm diameter. No other focal pulmonary nodules identified. Consider one of the following in 3 months for both low-risk and high-risk individuals: (a) repeat chest CT, (b) follow-up PET-CT, or (c) tissue sampling. This recommendation follows the consensus statement: Guidelines for Management of Incidental Pulmonary Nodules Detected on CT Images:From the Fleischner Society 2017; published online before print (10.1148/radiol.IJ:2314499). Patchy peripheral interstitial changes in the lungs may represent early edema. No pneumothorax. Included portions of the upper abdominal organs demonstrate surgical absence of the gallbladder. Two hypodense lesions demonstrated in the liver, right  posterior lobe at 10 mm and lateral left lobe at 10 mm. Left lobe lesion corresponds with small cyst demonstrated on ultrasound of the liver from 07/13/2009. The right lobe lesion is also probably a cyst although too small to characterize. Degenerative changes throughout the spine. No destructive bone lesions. Review of the MIP images confirms the above findings. IMPRESSION: No evidence of significant pulmonary embolus.  Bilateral pleural effusions, greater on the left, with consolidation or atelectasis in the left lung base. Indeterminate 9 mm nodule in the left upper lung. See followup recommendations above. Cardiac enlargement and small pericardial effusion. Probable hepatic cysts. Electronically Signed   By: Lucienne Capers M.D.   On: 03/06/2016 21:26   Dg Chest Portable 1 View  03/06/2016  CLINICAL DATA:  Shortness of breath and tachycardia tonight.  Cough. EXAM: PORTABLE CHEST 1 VIEW COMPARISON:  12/11/2015 FINDINGS: The heart is mildly enlarged but stable. Moderate tortuosity and ectasia of the thoracic aorta. There is a left lower lobe process likely a combination of infiltrate, atelectasis and effusion. The right lung is clear. The bony thorax is intact. IMPRESSION: Left lower lobe opacity likely a combination of infiltrate, atelectasis and effusion. Followup PA and lateral chest X-ray is recommended in 3-4 weeks following trial of antibiotic therapy to ensure resolution and exclude underlying malignancy. Electronically Signed   By: Marijo Sanes M.D.   On: 03/06/2016 18:34   I have personally reviewed and evaluated these images and lab results as part of my medical decision-making.   EKG Interpretation   Date/Time:  Wednesday March 06 2016 18:55:02 EDT Ventricular Rate:  53 PR Interval:    QRS Duration: 96 QT Interval:  543 QTC Calculation: 520 R Axis:   72 Text Interpretation:  Atrial flutter Low voltage, precordial leads  Abnormal T, consider ischemia, diffuse leads ST elevation,  consider  lateral injury Prolonged QT interval atrial flutter with minimal  conduction to ventricles Confirmed by LIU MD, DANA [phone]) on 03/07/2016  2:27:44 PM       CRITICAL CARE: Atrial flutter on continuous dilt infusion Performed by: Alvino Chapel   Total critical care time: 30 minutes  Critical care time was exclusive of separately billable procedures and treating other patients.  Critical care was necessary to treat or prevent imminent or life-threatening deterioration.  Critical care was time spent personally by me on the following activities: development of treatment plan with patient and/or surrogate as well as nursing, discussions with consultants, evaluation of patient's response to treatment, examination of patient, obtaining history from patient or surrogate, ordering and performing treatments and interventions, ordering and review of laboratory studies, ordering and review of radiographic studies, pulse oximetry and re-evaluation of patient's condition.   MDM   Final diagnoses:  Typical atrial flutter (HCC)  Tachycardia  Shortness of breath   82yo female with history of hypothyroidism, htn, hyperlipidemia presents from PCP office for concern for tachycardia to 150s, shortness of breath.  Patient with regular tachycardia, ectopic atrial tachycardia vs atrial flutter. Ordered ddimer given dyspnea with pleuritic upper back pain.  Given diltiazem bolus and infusion without change in HR. Consulted Cardiology, Dr. Einar Gip, and given 5mg  IV metoprolol. Pt admitted to Cardiology for continued care of new tachyarrhythmia.    Gareth Morgan, MD 03/07/16 1842

## 2016-03-06 NOTE — H&P (Signed)
Linda Mcgrath is an 82 y.o. female.   Chief Complaint: Afib/flutter HPI: Linda Mcgrath  is a 82 y.o. female with no significant past medical history. She states last week on Friday, she developed a severe headache, SOB, and sharp, "electric like" in her chest pain back. She called her PCP on Monday and had neck xray and CT scan. Chart note documents heart rate of 117 at that time. She returned today for follow up and heart rate was in the 150s and she was sent to the ED for further evaluation. She was noted to be in a.fib with RVR and we were consulted for further evaluation and management.   Past Medical History  Diagnosis Date  . Osteoarthritis   . Fibromyalgia   . Hypertension   . Hypothyroidism   . Hyperlipidemia   . Internal hemorrhoids   . Colon polyp 1999    small polyp  . Diverticulosis   . Lung nodule     stable LUL 9 mm  . Cataract     Bil/lens implant  . Allergic rhinitis   . Leg cramps   . Menopausal syndrome   . UTI (urinary tract infection)     Past Surgical History  Procedure Laterality Date  . Abdominal hysterectomy  1975    total-fibroids  . Rotator cuff repair      x 2 /right shoulder  . Skin cancer excision      pre-melanoma / on face  . Breast biopsy      benign  . Cataract extraction      bilateral  . Abd u/s  11/1998    negative  . Abd u/s  01/2001    gallbladder polyps  . Hida scan  01/2001    Negative  . Colonoscopy  1998    Diverticulosis; polyp\  . Dexa  10/2001    osteopenia  . Esophagogastroduodenoscopy  2004  . Colonoscopy  09/2002    Diverticulosis, hem  . Colonoscopy  12/2007    diverticulosis, polyp  . Appendectomy  1975  . Cholecystectomy    . Knee surgery      right knee / 11/2004  . Laser surgery for glaucoma Left Sep 21, 2014    Family History  Problem Relation Age of Onset  . Parkinsonism Father   . Colon cancer Brother   . Kidney failure Son     congenital  . Allergies Mother   . Heart disease  Mother    Social History:  reports that she has never smoked. She has never used smokeless tobacco. She reports that she does not drink alcohol or use illicit drugs.  Allergies:  Allergies  Allergen Reactions  . Shellfish Allergy Anaphylaxis and Nausea And Vomiting  . Tetracycline Hives, Nausea And Vomiting, Rash and Other (See Comments)    SYSTEMIC REACTION: heart palpitations,muscle spasms, vomiting  . Amlodipine Besylate     REACTION: muscle spasms, extremity swelling, low bp  . Ciprofloxacin     REACTION: muscle spasms, insomnia  . Codeine     REACTION: hallucinations  . Propoxyphene Hcl   . Sulfamethoxazole-Trimethoprim Nausea Only and Other (See Comments)    REACTION: dizzy, could not focus eyes and could not concentrate and nausea  . Tape Other (See Comments)    BANDAIDS TAKE OFF HER SKIN; PLEASE USE COBAN OR PAPER   . Amoxicillin Rash  . 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 Penicillins Swelling and Rash    Has patient had a PCN reaction causing immediate rash, facial/tongue/throat swelling, SOB or lightheadedness with hypotension: Yes Has patient had a PCN reaction causing severe rash involving mucus membranes or skin necrosis: No Has patient had a PCN reaction that required hospitalization No Has patient had a PCN reaction occurring within the last 10 years: No If all of the above answers are "NO", then may proceed with Cephalosporin use.     Review of Systems - History obtained from the patient General ROS: positive for  - fatigue negative for - chills, malaise, weight gain or weight loss Respiratory ROS: positive for - shortness of breath negative for - cough or orthopnea Cardiovascular ROS: positive for - chest pain and describes as sharp, electric-like pains negative for - edema, orthopnea or  paroxysmal nocturnal dyspnea Neurological ROS: no TIA or stroke symptoms  Blood pressure 106/91, pulse 154, temperature 98.2 F (36.8 C), resp. rate 11, weight 87.998 kg (194 lb), last menstrual period 08/26/1969, SpO2 96 %.  General appearance: alert, cooperative and no distress Eyes: negative Neck: no adenopathy, no carotid bruit, no JVD, supple, symmetrical, trachea midline and thyroid not enlarged, symmetric, no tenderness/mass/nodules Resp: clear to auscultation bilaterally Chest wall: no tenderness Cardio: S1 variable, S2 normal, tachycardic GI: soft, non-tender; bowel sounds normal; no masses,  no organomegaly Extremities: extremities normal, atraumatic, no cyanosis or edema Pulses: 2+ and symmetric Skin: Skin color, texture, turgor normal. No rashes or lesions Neurologic: Grossly normal  Results for orders placed or performed during the hospital encounter of 03/06/16 (from the past 48 hour(s))  Basic metabolic panel     Status: Abnormal   Collection Time: 03/06/16  4:33 PM  Result Value Ref Range   Sodium 135 135 - 145 mmol/L   Potassium 3.8 3.5 - 5.1 mmol/L   Chloride 103 101 - 111 mmol/L   CO2 21 (L) 22 - 32 mmol/L   Glucose, Bld 96 65 - 99 mg/dL   BUN 17 6 - 20 mg/dL   Creatinine, Ser 1.01 (H) 0.44 - 1.00 mg/dL   Calcium 9.0 8.9 - 10.3 mg/dL   GFR calc non Af Amer 51 (L) >60 mL/min   GFR calc Af Amer 59 (L) >60 mL/min    Comment: (NOTE) The eGFR has been calculated using the CKD EPI equation. This calculation has not been validated in all clinical situations. eGFR's persistently <60 mL/min signify possible Chronic Kidney Disease.    Anion gap 11 5 - 15  CBC     Status: None   Collection Time: 03/06/16  4:33 PM  Result Value Ref Range   WBC 9.5 4.0 - 10.5 K/uL   RBC 4.29 3.87 - 5.11 MIL/uL   Hemoglobin 14.2 12.0 - 15.0 g/dL   HCT 41.5 36.0 - 46.0 %   MCV 96.7 78.0 - 100.0 fL   MCH 33.1 26.0 - 34.0 pg   MCHC 34.2 30.0 - 36.0 g/dL   RDW 13.7 11.5 - 15.5 %    Platelets 360 150 - 400 K/uL  Magnesium     Status: None   Collection Time: 03/06/16  4:33 PM  Result Value Ref Range   Magnesium 2.1 1.7 - 2.4 mg/dL  Hepatic function panel     Status: Abnormal   Collection Time: 03/06/16  4:33 PM  Result Value Ref Range   Total Protein 6.4 (  L) 6.5 - 8.1 g/dL   Albumin 2.9 (L) 3.5 - 5.0 g/dL   AST 66 (H) 15 - 41 U/L   ALT 51 14 - 54 U/L   Alkaline Phosphatase 92 38 - 126 U/L   Total Bilirubin 0.9 0.3 - 1.2 mg/dL   Bilirubin, Direct 0.2 0.1 - 0.5 mg/dL   Indirect Bilirubin 0.7 0.3 - 0.9 mg/dL   Dg Chest Portable 1 View  03/06/2016  CLINICAL DATA:  Shortness of breath and tachycardia tonight.  Cough. EXAM: PORTABLE CHEST 1 VIEW COMPARISON:  12/11/2015 FINDINGS: The heart is mildly enlarged but stable. Moderate tortuosity and ectasia of the thoracic aorta. There is a left lower lobe process likely a combination of infiltrate, atelectasis and effusion. The right lung is clear. The bony thorax is intact. IMPRESSION: Left lower lobe opacity likely a combination of infiltrate, atelectasis and effusion. Followup PA and lateral chest X-ray is recommended in 3-4 weeks following trial of antibiotic therapy to ensure resolution and exclude underlying malignancy. Electronically Signed   By: Marijo Sanes M.D.   On: 03/06/2016 18:34    Labs:   Lab Results  Component Value Date   WBC 9.5 03/06/2016   HGB 14.2 03/06/2016   HCT 41.5 03/06/2016   MCV 96.7 03/06/2016   PLT 360 03/06/2016    Recent Labs Lab 03/06/16 1633  NA 135  K 3.8  CL 103  CO2 21*  BUN 17  CREATININE 1.01*  CALCIUM 9.0  PROT 6.4*  BILITOT 0.9  ALKPHOS 92  ALT 51  AST 66*  GLUCOSE 96    Lipid Panel     Component Value Date/Time   CHOL 200 11/01/2015 1152   TRIG 107.0 11/01/2015 1152   HDL 72.60 11/01/2015 1152   CHOLHDL 3 11/01/2015 1152   VLDL 21.4 11/01/2015 1152   LDLCALC 106* 11/01/2015 1152    BNP (last 3 results) No results for input(s): BNP in the last 8760  hours.  HEMOGLOBIN A1C Lab Results  Component Value Date   HGBA1C 5.3 11/01/2015    Cardiac Panel (last 3 results) No results for input(s): CKTOTAL, CKMB, TROPONINI, RELINDX in the last 8760 hours.  No results found for: CKTOTAL, CKMB, CKMBINDEX, TROPONINI   TSH  Recent Labs  11/01/15 1152  TSH 3.13    EKG 03/06/2016 at 1854: a flutter vs coarse a.fib with RVR at a rate of 150 bpm, PRWP  EKG 03/06/2016 at 1855 post adenosine administration: atrial flutter evident, immediately resumed RVR with 2:1 conduction.    (Not in a hospital admission)    Current facility-administered medications:  .  [COMPLETED] diltiazem (CARDIZEM) 1 mg/mL load via infusion 15 mg, 15 mg, Intravenous, Once, 15 mg at 03/06/16 1643 **AND** diltiazem (CARDIZEM) 100 mg in dextrose 5 % 100 mL (1 mg/mL) infusion, 5-15 mg/hr, Intravenous, Continuous, Gareth Morgan, MD, Last Rate: 15 mL/hr at 03/06/16 1743, 15 mg/hr at 03/06/16 1743  Current outpatient prescriptions:  .  albuterol (PROVENTIL HFA;VENTOLIN HFA) 108 (90 BASE) MCG/ACT inhaler, Inhale 2 puffs into the lungs every 4 (four) hours as needed for wheezing., Disp: 1 Inhaler, Rfl: 5 .  Ascorbic Acid (VITAMIN C) 1000 MG tablet, Take 1,000 mg by mouth daily.  , Disp: , Rfl:  .  B Complex Vitamins (VITAMIN B COMPLEX PO), Take 1 tablet by mouth daily., Disp: , Rfl:  .  Biotin 10 MG TABS, Take 1 tablet by mouth every morning. , Disp: , Rfl:  .  Calcium Carbonate-Vit D-Min 600-400 MG-UNIT  TABS, Take 1 tablet by mouth every morning. , Disp: , Rfl:  .  chlorhexidine (PERIDEX) 0.12 % solution, Use as directed 5-15 mLs in the mouth or throat daily as needed (for breakouts). Reported on 03/06/2016, Disp: , Rfl:  .  Cranberry 500 MG CAPS, Take 1 capsule by mouth daily. , Disp: , Rfl:  .  cyclobenzaprine (FLEXERIL) 10 MG tablet, Take 1 tablet (10 mg total) by mouth at bedtime as needed., Disp: 90 tablet, Rfl: 0 .  estradiol (ESTRACE) 1 MG tablet, Take 1 tablet (1 mg  total) by mouth daily., Disp: 90 tablet, Rfl: 3 .  fluticasone (FLONASE) 50 MCG/ACT nasal spray, Place 1 spray into both nostrils daily as needed for allergies or rhinitis., Disp: , Rfl:  .  folic acid (FOLVITE) 1 MG tablet, Take 1 mg by mouth daily., Disp: , Rfl:  .  gabapentin (NEURONTIN) 100 MG capsule, Take 1 capsule (100 mg total) by mouth 3 (three) times daily as needed., Disp: 90 capsule, Rfl: 0 .  ibuprofen (ADVIL,MOTRIN) 200 MG tablet, Take 400 mg by mouth at bedtime. For leg pain/discomfort, Disp: , Rfl:  .  levothyroxine (SYNTHROID, LEVOTHROID) 88 MCG tablet, Take 1 tablet (88 mcg total) by mouth daily with breakfast., Disp: 90 tablet, Rfl: 3 .  Lutein 20 MG CAPS, Take 1 capsule by mouth daily. , Disp: , Rfl:  .  methotrexate (RHEUMATREX) 2.5 MG tablet, Take 7.5 mg by mouth every 7 (seven) days. [facility]), Disp: , Rfl:  .  montelukast (SINGULAIR) 10 MG tablet, Take 1 tablet (10 mg total) by mouth at bedtime., Disp: 90 tablet, Rfl: 3 .  Multiple Vitamin (MULTIVITAMIN) capsule, Take 1 capsule by mouth daily.  , Disp: , Rfl:  .  Pumpkin Seed-Soy Germ (AZO BLADDER CONTROL/GO-LESS PO), Take 310 mg by mouth 3 (three) times daily., Disp: , Rfl:  .  TURMERIC PO, Take 1 capsule by mouth daily., Disp: , Rfl:  .  vitamin B-12 (CYANOCOBALAMIN) 500 MCG tablet, Take 500 mcg by mouth daily., Disp: , Rfl:     Assessment/Plan 1. New onset a.fib/flutter (CHA2DS2-VASCScore: Risk Score 3,  Yearly risk of stroke 3.2%. Recommendation: ASA No/Anticoagulation yes; presently on heparin gtt, will change to NOAC on discharge. [place] Has Bled: Score 1.  Estimated risk of major bleeding at 1 year with [place] 1.02-1.5%)  Recommendation: Adenosine given at bedside to confirm atrial flutter. Onset likely 5 days ago. Will begin heparin gtt for anticoagulation. Continue cardizem gtt and add metoprolol IV 67m q6h for rate control. Echocardiogram tomorrow morning.   ARachel Bo NP-C 03/06/2016, 6:49  PM [facility]. PNew [place]Pager: [phone] Office: [phone] no answer: Cell:  [phone]

## 2016-03-06 NOTE — Patient Instructions (Signed)
To ER

## 2016-03-06 NOTE — ED Notes (Signed)
Pt given ice chips per Nonie Hoyer RN at this time.

## 2016-03-06 NOTE — ED Notes (Signed)
Dr. Einar Gip notified of abnormal D Dimer

## 2016-03-06 NOTE — Progress Notes (Signed)
Pre visit review using our clinic review tool, if applicable. No additional management support is needed unless otherwise documented below in the visit note.  HA resolved, some dec in the upper back and neck pain.  Is on gabapentin, no ADE except for some dry mouth. She'll get up at night to get some water, use biotin, for dry mouth, but she'll get SOB.  She still has a sensation of not taking a full deep breath unclear if that is from the chest wall/nerve pain she was having.  .    She still can't tolerate a tight shirt since that makes the pain worse on her torso.    She still feels the need to breath shallowly, with dec in exercise tolerance.  Unclear if from true SOB or from another issue (ie from nerve compression above).  No substernal CP.  D/w pt.   Meds, vitals, and allergies reviewed.   ROS: Per HPI unless specifically indicated in ROS section   Incidental tachy up to 150s on exam.

## 2016-03-07 ENCOUNTER — Observation Stay (HOSPITAL_COMMUNITY): Payer: Medicare Other

## 2016-03-07 ENCOUNTER — Encounter (HOSPITAL_COMMUNITY)
Admission: EM | Disposition: A | Discharge status: Home / Self Care | Payer: Self-pay | Source: Home / Self Care | Attending: Cardiology

## 2016-03-07 ENCOUNTER — Observation Stay (HOSPITAL_COMMUNITY): Payer: Medicare Other | Admitting: Certified Registered Nurse Anesthetist

## 2016-03-07 ENCOUNTER — Encounter (HOSPITAL_COMMUNITY): Payer: Self-pay | Admitting: *Deleted

## 2016-03-07 DIAGNOSIS — I10 Essential (primary) hypertension: Secondary | ICD-10-CM | POA: Diagnosis present

## 2016-03-07 DIAGNOSIS — E7800 Pure hypercholesterolemia, unspecified: Secondary | ICD-10-CM | POA: Diagnosis not present

## 2016-03-07 DIAGNOSIS — Z888 Allergy status to other drugs, medicaments and biological substances status: Secondary | ICD-10-CM | POA: Diagnosis not present

## 2016-03-07 DIAGNOSIS — I483 Typical atrial flutter: Principal | ICD-10-CM

## 2016-03-07 DIAGNOSIS — I480 Paroxysmal atrial fibrillation: Secondary | ICD-10-CM | POA: Diagnosis not present

## 2016-03-07 DIAGNOSIS — M797 Fibromyalgia: Secondary | ICD-10-CM | POA: Diagnosis present

## 2016-03-07 DIAGNOSIS — R000 Tachycardia, unspecified: Secondary | ICD-10-CM | POA: Diagnosis not present

## 2016-03-07 DIAGNOSIS — I129 Hypertensive chronic kidney disease with stage 1 through stage 4 chronic kidney disease, or unspecified chronic kidney disease: Secondary | ICD-10-CM | POA: Diagnosis not present

## 2016-03-07 DIAGNOSIS — N951 Menopausal and female climacteric states: Secondary | ICD-10-CM | POA: Diagnosis present

## 2016-03-07 DIAGNOSIS — I4891 Unspecified atrial fibrillation: Secondary | ICD-10-CM | POA: Diagnosis present

## 2016-03-07 DIAGNOSIS — Z880 Allergy status to penicillin: Secondary | ICD-10-CM | POA: Diagnosis not present

## 2016-03-07 DIAGNOSIS — E039 Hypothyroidism, unspecified: Secondary | ICD-10-CM | POA: Diagnosis present

## 2016-03-07 DIAGNOSIS — E785 Hyperlipidemia, unspecified: Secondary | ICD-10-CM | POA: Diagnosis present

## 2016-03-07 DIAGNOSIS — N189 Chronic kidney disease, unspecified: Secondary | ICD-10-CM | POA: Diagnosis not present

## 2016-03-07 DIAGNOSIS — I4589 Other specified conduction disorders: Secondary | ICD-10-CM | POA: Diagnosis present

## 2016-03-07 DIAGNOSIS — R001 Bradycardia, unspecified: Secondary | ICD-10-CM | POA: Diagnosis not present

## 2016-03-07 DIAGNOSIS — J90 Pleural effusion, not elsewhere classified: Secondary | ICD-10-CM | POA: Diagnosis present

## 2016-03-07 DIAGNOSIS — Z882 Allergy status to sulfonamides status: Secondary | ICD-10-CM | POA: Diagnosis not present

## 2016-03-07 HISTORY — PX: CARDIOVERSION: SHX1299

## 2016-03-07 HISTORY — PX: TEE WITHOUT CARDIOVERSION: SHX5443

## 2016-03-07 LAB — MRSA PCR SCREENING: MRSA by PCR: NEGATIVE

## 2016-03-07 LAB — HEPARIN LEVEL (UNFRACTIONATED)
Heparin Unfractionated: <0.1 IU/mL — ABNORMAL LOW (ref 0.30–0.70)
Heparin Unfractionated: 0.37 IU/mL (ref 0.30–0.70)

## 2016-03-07 SURGERY — ECHOCARDIOGRAM, TRANSESOPHAGEAL
Anesthesia: Monitor Anesthesia Care

## 2016-03-07 SURGERY — CARDIOVERSION
Anesthesia: Choice

## 2016-03-07 MED ORDER — SODIUM CHLORIDE 0.9 % IV SOLN
INTRAVENOUS | Status: DC
Start: 2016-03-07 — End: 2016-03-07

## 2016-03-07 MED ORDER — BUTAMBEN-TETRACAINE-BENZOCAINE 2-2-14 % EX AERO
INHALATION_SPRAY | CUTANEOUS | Status: DC | PRN
  Administered 2016-03-07: 2 via TOPICAL

## 2016-03-07 MED ORDER — LACTATED RINGERS IV SOLN
INTRAVENOUS | Status: DC | PRN
Start: 2016-03-07 — End: 2016-03-07
  Administered 2016-03-07: 14:00:00 via INTRAVENOUS

## 2016-03-07 MED ORDER — HEPARIN BOLUS VIA INFUSION
2000.0000 [IU] | Freq: Once | INTRAVENOUS | Status: AC
  Administered 2016-03-07: 2000 [IU] via INTRAVENOUS
  Filled 2016-03-07: qty 2000

## 2016-03-07 MED ORDER — METOPROLOL TARTRATE 25 MG PO TABS
25.0000 mg | ORAL_TABLET | Freq: Two times a day (BID) | ORAL | Status: DC
  Administered 2016-03-07 – 2016-03-08 (×3): 25 mg via ORAL
  Filled 2016-03-07 (×3): qty 1

## 2016-03-07 MED ORDER — PROPOFOL 10 MG/ML IV BOLUS
INTRAVENOUS | Status: DC | PRN
  Administered 2016-03-07: 20 mg via INTRAVENOUS

## 2016-03-07 MED ORDER — LEVOTHYROXINE SODIUM 88 MCG PO TABS
88.0000 ug | ORAL_TABLET | Freq: Every day | ORAL | Status: DC
  Administered 2016-03-07 – 2016-03-08 (×2): 88 ug via ORAL
  Filled 2016-03-07 (×2): qty 1

## 2016-03-07 MED ORDER — FLECAINIDE ACETATE 100 MG PO TABS
100.0000 mg | ORAL_TABLET | Freq: Two times a day (BID) | ORAL | Status: DC
  Administered 2016-03-08: 100 mg via ORAL
  Filled 2016-03-07: qty 1

## 2016-03-07 MED ORDER — FLECAINIDE ACETATE 100 MG PO TABS
100.0000 mg | ORAL_TABLET | ORAL | Status: AC
  Administered 2016-03-07: 100 mg via ORAL
  Filled 2016-03-07: qty 1

## 2016-03-07 MED ORDER — FLECAINIDE ACETATE 50 MG PO TABS
50.0000 mg | ORAL_TABLET | Freq: Once | ORAL | Status: AC
  Administered 2016-03-07: 50 mg via ORAL
  Filled 2016-03-07 (×2): qty 1

## 2016-03-07 MED ORDER — ESTRADIOL 1 MG PO TABS
1.0000 mg | ORAL_TABLET | Freq: Every day | ORAL | Status: DC
  Administered 2016-03-07 – 2016-03-08 (×2): 1 mg via ORAL
  Filled 2016-03-07 (×2): qty 1

## 2016-03-07 MED ORDER — PROPOFOL 500 MG/50ML IV EMUL
INTRAVENOUS | Status: DC | PRN
  Administered 2016-03-07: 75 ug/kg/min via INTRAVENOUS

## 2016-03-07 MED ORDER — FLECAINIDE ACETATE 100 MG PO TABS: 100.0000 mg | ORAL_TABLET | Freq: Two times a day (BID) | ORAL | Status: DC

## 2016-03-07 MED ORDER — RIVAROXABAN 20 MG PO TABS
20.0000 mg | ORAL_TABLET | Freq: Every day | ORAL | Status: DC
  Administered 2016-03-07: 20 mg via ORAL
  Filled 2016-03-07: qty 1

## 2016-03-07 NOTE — Progress Notes (Signed)
ANTICOAGULATION CONSULT NOTE - Follow-up Consult  Pharmacy Consult for heparin  Indication: atrial fibrillation  Allergies  Allergen Reactions  . Shellfish Allergy Anaphylaxis and Nausea And Vomiting  . Tetracycline Hives, Nausea And Vomiting, Rash and Other (See Comments)    SYSTEMIC REACTION: heart palpitations,muscle spasms, vomiting  . Amlodipine Besylate     REACTION: muscle spasms, extremity swelling, low bp  . Ciprofloxacin     REACTION: muscle spasms, insomnia  . Codeine     REACTION: hallucinations  . Propoxyphene Hcl   . Sulfamethoxazole-Trimethoprim Nausea Only and Other (See Comments)    REACTION: dizzy, could not focus eyes and could not concentrate and nausea  . Tape Other (See Comments)    BANDAIDS TAKE OFF HER SKIN; PLEASE USE COBAN OR PAPER   . Amoxicillin Rash  . 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 Penicillins Swelling and Rash    Has patient had a PCN reaction causing immediate rash, facial/tongue/throat swelling, SOB or lightheadedness with hypotension: Yes Has patient had a PCN reaction causing severe rash involving mucus membranes or skin necrosis: No Has patient had a PCN reaction that required hospitalization No Has patient had a PCN reaction occurring within the last 10 years: No If all of the above answers are "NO", then may proceed with Cephalosporin use.     Patient Measurements: Height: 5\' 5"  (165.1 cm) Weight: 196 lb 4.8 oz (89.041 kg) IBW/kg (Calculated) : 57 Heparin Dosing Weight: 76  Vital Signs: Temp: 98.4 F (36.9 C) (07/12 2348) Temp Source: Oral (07/12 2348) BP: 112/73 mmHg (07/13 0321) Pulse Rate: 152 (07/13 0321)  Labs:  Recent Labs  03/06/16 1633 03/07/16 0354  HGB 14.2  --   HCT 41.5  --   PLT 360  --   HEPARINUNFRC  --  <0.10*  CREATININE 1.01*  --      Estimated Creatinine Clearance: 49 mL/min (by C-G formula based on Cr of 1.01).   Assessment: 82 yo F on heparin for new onset atrial fibrillation. Heparin level undetectable on 1000 units/hr. No issues with line or bleeding reported per RN.  Goal of Therapy:  Heparin level 0.3-0.7 units/ml Monitor platelets by anticoagulation protocol: Yes   Plan:  Rebolus heparin 2000 units IV Increase heparin gtt to 1300 units/hr Will f/u 8 hr heparin level  Sherlon Handing, PharmD, BCPS Clinical pharmacist, pager [phone] 03/07/2016, 5:15 AM

## 2016-03-07 NOTE — Progress Notes (Signed)
Subjective:  Feels tired but didn't sleep well through the night. Otherwise asymptomatic.   Objective:  Vital Signs in the last 24 hours: Temp:  [97.9 F (36.6 C)-98.5 F (36.9 C)] 98.5 F (36.9 C) (07/13 0520) Pulse Rate:  [42-156] 42 (07/13 0653) Resp:  [11-34] 23 (07/13 0653) BP: (85-141)/(57-98) 94/61 mmHg (07/13 0653) SpO2:  [90 %-98 %] 94 % (07/13 0653) Weight:  [87.998 kg (194 lb)-89.041 kg (196 lb 4.8 oz)] 88.497 kg (195 lb 1.6 oz) (07/13 0520)  Intake/Output from previous day: 07/12 0701 - 07/13 0700 In: 536.3 [P.O.:240; I.V.:296.3] Out: 800 [Urine:800]  Physical Exam: General appearance: alert, cooperative and no distress Neck: no adenopathy, no carotid bruit, no JVD, supple, symmetrical, trachea midline and thyroid not enlarged, symmetric, no tenderness/mass/nodules Resp: crackles in left lung base Chest wall: no tenderness Cardio: S1, S2 normal, tachycardic, distant heart sounds GI: soft, non-tender; bowel sounds normal; no masses, no organomegaly Extremities: extremities normal, atraumatic, no cyanosis or edema Pulses: 2+ and symmetric Skin: Skin color, texture, turgor normal. No rashes or lesions Neurologic: Grossly normal  Lab Results: BMP  Recent Labs  11/01/15 1152 03/06/16 1633  NA 140 135  K 4.4 3.8  CL 104 103  CO2 28 21*  GLUCOSE 90 96  BUN 19 17  CREATININE 0.97 1.01*  CALCIUM 9.5 9.0  GFRNONAA  --  51*  GFRAA  --  59*    CBC  Recent Labs Lab 03/06/16 1633  WBC 9.5  RBC 4.29  HGB 14.2  HCT 41.5  PLT 360  MCV 96.7  MCH 33.1  MCHC 34.2  RDW 13.7    HEMOGLOBIN A1C Lab Results  Component Value Date   HGBA1C 5.3 11/01/2015    Cardiac Panel (last 3 results) No results for input(s): CKTOTAL, CKMB, TROPONINI, RELINDX in the last 8760 hours.  BNP (last 3 results) No results for input(s): PROBNP in the last 8760 hours.  TSH  Recent Labs  11/01/15 1152  TSH 3.13    CHOLESTEROL  Recent Labs  11/01/15 1152  CHOL 200     Hepatic Function Panel  Recent Labs  11/01/15 1152 03/06/16 1633  PROT 6.6 6.4*  ALBUMIN 3.9 2.9*  AST 16 66*  ALT 15 51  ALKPHOS 72 92  BILITOT 0.6 0.9  BILIDIR  --  0.2  IBILI  --  0.7    Imaging: Ct Angio Chest Pe W/cm &/or Wo Cm  03/06/2016  CLINICAL DATA:  Friday last week patient developed severe headaches, shortness of breath, and sharp chest and back pain. Patient saw primary care physician on Monday and had a CT scan. Patient's heart rate is increasing and she is sent to the ED for further evaluation. Atrial fibrillation. Elevated D-dimer. EXAM: CT ANGIOGRAPHY CHEST WITH CONTRAST TECHNIQUE: Multidetector CT imaging of the chest was performed using the standard protocol during bolus administration of intravenous contrast. Multiplanar CT image reconstructions and MIPs were obtained to evaluate the vascular anatomy. CONTRAST:  65 mL Isovue 370 COMPARISON:  None. FINDINGS: Technically adequate examination with good opacification of the central and segmental pulmonary arteries. No focal filling defects are demonstrated. No evidence of significant pulmonary embolus. Mild cardiac enlargement. Small pericardial effusion. Coronary artery calcifications. Normal caliber thoracic aorta. Scattered lymph nodes in the mediastinum are not pathologically enlarged. Largest pretracheal lymph node measures about 10 mm short axis dimension. Esophagus is decompressed. Small bilateral pleural effusions, greater on the left. Consolidation or atelectasis in the left lung base. Solid circumscribed noncalcified nodule  in the left upper lung measuring 9 mm diameter. No other focal pulmonary nodules identified. Consider one of the following in 3 months for both low-risk and high-risk individuals: (a) repeat chest CT, (b) follow-up PET-CT, or (c) tissue sampling. This recommendation follows the consensus statement: Guidelines for Management of Incidental Pulmonary Nodules Detected on CT Images:From the Fleischner  Society 2017; published online before print (10.1148/radiol.SG:5268862). Patchy peripheral interstitial changes in the lungs may represent early edema. No pneumothorax. Included portions of the upper abdominal organs demonstrate surgical absence of the gallbladder. Two hypodense lesions demonstrated in the liver, right posterior lobe at 10 mm and lateral left lobe at 10 mm. Left lobe lesion corresponds with small cyst demonstrated on ultrasound of the liver from 07/13/2009. The right lobe lesion is also probably a cyst although too small to characterize. Degenerative changes throughout the spine. No destructive bone lesions. Review of the MIP images confirms the above findings. IMPRESSION: No evidence of significant pulmonary embolus. Bilateral pleural effusions, greater on the left, with consolidation or atelectasis in the left lung base. Indeterminate 9 mm nodule in the left upper lung. See followup recommendations above. Cardiac enlargement and small pericardial effusion. Probable hepatic cysts. Electronically Signed   By: Lucienne Capers M.D.   On: 03/06/2016 21:26   Dg Chest Portable 1 View  03/06/2016  CLINICAL DATA:  Shortness of breath and tachycardia tonight.  Cough. EXAM: PORTABLE CHEST 1 VIEW COMPARISON:  12/11/2015 FINDINGS: The heart is mildly enlarged but stable. Moderate tortuosity and ectasia of the thoracic aorta. There is a left lower lobe process likely a combination of infiltrate, atelectasis and effusion. The right lung is clear. The bony thorax is intact. IMPRESSION: Left lower lobe opacity likely a combination of infiltrate, atelectasis and effusion. Followup PA and lateral chest X-ray is recommended in 3-4 weeks following trial of antibiotic therapy to ensure resolution and exclude underlying malignancy. Electronically Signed   By: Marijo Sanes M.D.   On: 03/06/2016 18:34    Cardiac Studies: EKG 03/07/2016: atrial flutter with 2:1 conduction with ventricular rate of 150.  Echo  03/07/2016: pending  Assessment/Plan:  1. New onset a.fib/flutter (CHA2DS2-VASCScore: Risk Score 3, Yearly risk of stroke 3.2%. Recommendation: ASA No/Anticoagulation yes; presently on heparin gtt, will change to NOAC on discharge. [place] Has Bled: Score 1. Estimated risk of major bleeding at 1 year with [place] 1.02-1.5%) 2. Bilateral pleural effusions  Recommendation: Remains in a.flutter, has developed atelectasis on left side by exam, bilaterally by CT. Echo pending. Will have EP consult regarding further management. Pt has been kept NPO since midnight. Continue heparin gtt. BP soft but stable, will continue cardizem gtt for now.   Linda Bo, NP-C 03/07/2016, 8:50 AM [facility] Pager: [phone] Office: [phone]

## 2016-03-07 NOTE — H&P (View-Only) (Signed)
Subjective:  Feels tired but didn't sleep well through the night. Otherwise asymptomatic.   Objective:  Vital Signs in the last 24 hours: Temp:  [97.9 F (36.6 C)-98.5 F (36.9 C)] 98.5 F (36.9 C) (07/13 0520) Pulse Rate:  [42-156] 42 (07/13 0653) Resp:  [11-34] 23 (07/13 0653) BP: (85-141)/(57-98) 94/61 mmHg (07/13 0653) SpO2:  [90 %-98 %] 94 % (07/13 0653) Weight:  [87.998 kg (194 lb)-89.041 kg (196 lb 4.8 oz)] 88.497 kg (195 lb 1.6 oz) (07/13 0520)  Intake/Output from previous day: 07/12 0701 - 07/13 0700 In: 536.3 [P.O.:240; I.V.:296.3] Out: 800 [Urine:800]  Physical Exam: General appearance: alert, cooperative and no distress Neck: no adenopathy, no carotid bruit, no JVD, supple, symmetrical, trachea midline and thyroid not enlarged, symmetric, no tenderness/mass/nodules Resp: crackles in left lung base Chest wall: no tenderness Cardio: S1, S2 normal, tachycardic, distant heart sounds GI: soft, non-tender; bowel sounds normal; no masses, no organomegaly Extremities: extremities normal, atraumatic, no cyanosis or edema Pulses: 2+ and symmetric Skin: Skin color, texture, turgor normal. No rashes or lesions Neurologic: Grossly normal  Lab Results: BMP  Recent Labs  11/01/15 1152 03/06/16 1633  NA 140 135  K 4.4 3.8  CL 104 103  CO2 28 21*  GLUCOSE 90 96  BUN 19 17  CREATININE 0.97 1.01*  CALCIUM 9.5 9.0  GFRNONAA  --  51*  GFRAA  --  59*    CBC  Recent Labs Lab 03/06/16 1633  WBC 9.5  RBC 4.29  HGB 14.2  HCT 41.5  PLT 360  MCV 96.7  MCH 33.1  MCHC 34.2  RDW 13.7    HEMOGLOBIN A1C Lab Results  Component Value Date   HGBA1C 5.3 11/01/2015    Cardiac Panel (last 3 results) No results for input(s): CKTOTAL, CKMB, TROPONINI, RELINDX in the last 8760 hours.  BNP (last 3 results) No results for input(s): PROBNP in the last 8760 hours.  TSH  Recent Labs  11/01/15 1152  TSH 3.13    CHOLESTEROL  Recent Labs  11/01/15 1152  CHOL 200     Hepatic Function Panel  Recent Labs  11/01/15 1152 03/06/16 1633  PROT 6.6 6.4*  ALBUMIN 3.9 2.9*  AST 16 66*  ALT 15 51  ALKPHOS 72 92  BILITOT 0.6 0.9  BILIDIR  --  0.2  IBILI  --  0.7    Imaging: Ct Angio Chest Pe W/cm &/or Wo Cm  03/06/2016  CLINICAL DATA:  Friday last week patient developed severe headaches, shortness of breath, and sharp chest and back pain. Patient saw primary care physician on Monday and had a CT scan. Patient's heart rate is increasing and she is sent to the ED for further evaluation. Atrial fibrillation. Elevated D-dimer. EXAM: CT ANGIOGRAPHY CHEST WITH CONTRAST TECHNIQUE: Multidetector CT imaging of the chest was performed using the standard protocol during bolus administration of intravenous contrast. Multiplanar CT image reconstructions and MIPs were obtained to evaluate the vascular anatomy. CONTRAST:  65 mL Isovue 370 COMPARISON:  None. FINDINGS: Technically adequate examination with good opacification of the central and segmental pulmonary arteries. No focal filling defects are demonstrated. No evidence of significant pulmonary embolus. Mild cardiac enlargement. Small pericardial effusion. Coronary artery calcifications. Normal caliber thoracic aorta. Scattered lymph nodes in the mediastinum are not pathologically enlarged. Largest pretracheal lymph node measures about 10 mm short axis dimension. Esophagus is decompressed. Small bilateral pleural effusions, greater on the left. Consolidation or atelectasis in the left lung base. Solid circumscribed noncalcified nodule  in the left upper lung measuring 9 mm diameter. No other focal pulmonary nodules identified. Consider one of the following in 3 months for both low-risk and high-risk individuals: (a) repeat chest CT, (b) follow-up PET-CT, or (c) tissue sampling. This recommendation follows the consensus statement: Guidelines for Management of Incidental Pulmonary Nodules Detected on CT Images:From the Fleischner  Society 2017; published online before print (10.1148/radiol.SG:[id number]). Patchy peripheral interstitial changes in the lungs may represent early edema. No pneumothorax. Included portions of the upper abdominal organs demonstrate surgical absence of the gallbladder. Two hypodense lesions demonstrated in the liver, right posterior lobe at 10 mm and lateral left lobe at 10 mm. Left lobe lesion corresponds with small cyst demonstrated on ultrasound of the liver from 07/13/2009. The right lobe lesion is also probably a cyst although too small to characterize. Degenerative changes throughout the spine. No destructive bone lesions. Review of the MIP images confirms the above findings. IMPRESSION: No evidence of significant pulmonary embolus. Bilateral pleural effusions, greater on the left, with consolidation or atelectasis in the left lung base. Indeterminate 9 mm nodule in the left upper lung. See followup recommendations above. Cardiac enlargement and small pericardial effusion. Probable hepatic cysts. Electronically Signed   By: Lucienne Capers M.D.   On: 03/06/2016 21:26   Dg Chest Portable 1 View  03/06/2016  CLINICAL DATA:  Shortness of breath and tachycardia tonight.  Cough. EXAM: PORTABLE CHEST 1 VIEW COMPARISON:  12/11/2015 FINDINGS: The heart is mildly enlarged but stable. Moderate tortuosity and ectasia of the thoracic aorta. There is a left lower lobe process likely a combination of infiltrate, atelectasis and effusion. The right lung is clear. The bony thorax is intact. IMPRESSION: Left lower lobe opacity likely a combination of infiltrate, atelectasis and effusion. Followup PA and lateral chest X-ray is recommended in 3-4 weeks following trial of antibiotic therapy to ensure resolution and exclude underlying malignancy. Electronically Signed   By: Marijo Sanes M.D.   On: 03/06/2016 18:34    Cardiac Studies: EKG 03/07/2016: atrial flutter with 2:1 conduction with ventricular rate of 150.  Echo  03/07/2016: pending  Assessment/Plan:  1. New onset a.fib/flutter (CHA2DS2-VASCScore: Risk Score 3, Yearly risk of stroke 3.2%. Recommendation: ASA No/Anticoagulation yes; presently on heparin gtt, will change to NOAC on discharge. [place] Has Bled: Score 1. Estimated risk of major bleeding at 1 year with [place] 1.02-1.5%) 2. Bilateral pleural effusions  Recommendation: Remains in a.flutter, has developed atelectasis on left side by exam, bilaterally by CT. Echo pending. Will have EP consult regarding further management. Pt has been kept NPO since midnight. Continue heparin gtt. BP soft but stable, will continue cardizem gtt for now.   Rachel Bo, NP-C 03/07/2016, 8:50 AM [facility] Pager: [phone] Office: [phone]

## 2016-03-07 NOTE — Interval H&P Note (Signed)
History and Physical Interval Note:  03/07/2016 1:39 PM  Linda Mcgrath  has presented today for surgery, with the diagnosis of tee/cardioversion  The various methods of treatment have been discussed with the patient and family. After consideration of risks, benefits and other options for treatment, the patient has consented to  Procedure(s): CARDIOVERSION / TEE (N/A) as a surgical intervention .  The patient's history has been reviewed, patient examined, no change in status, stable for surgery.  I have reviewed the patient's chart and labs.  Questions were answered to the patient's satisfaction.     Adrian Prows

## 2016-03-07 NOTE — CV Procedure (Signed)
Direct current cardioversion:  Indication symptomatic A. Flutter  Procedure: Using total 50  mg of IV Propofol  for achieving deep sedation, synchronized direct current cardioversion performed. Patient was delivered with 50 Joules of electricity X 1 with success to NSR. Patient tolerated the procedure well. No immediate complication noted.  Sedation monitored by anesthesia.

## 2016-03-07 NOTE — Transfer of Care (Signed)
Immediate Anesthesia Transfer of Care Note  Patient: Linda Mcgrath  Procedure(s) Performed: Procedure(s): TRANSESOPHAGEAL ECHOCARDIOGRAM (TEE) (N/A) CARDIOVERSION (N/A)  Patient Location: PACU and Endoscopy Unit  Anesthesia Type:MAC  Level of Consciousness: awake, alert , oriented and patient cooperative  Airway & Oxygen Therapy: Patient Spontanous Breathing and Patient connected to nasal cannula oxygen  Post-op Assessment: Report given to RN and Post -op Vital signs reviewed and stable  Post vital signs: Reviewed and stable  Last Vitals:  Filed Vitals:   03/07/16 1334 03/07/16 1410  BP: 110/81 121/102  Pulse: 144 58  Temp: 36.8 C   Resp: 25 21    Last Pain:  Filed Vitals:   03/07/16 1411  PainSc: 0-No pain      Patients Stated Pain Goal: 0 ([id number])  Complications: No apparent anesthesia complications

## 2016-03-07 NOTE — CV Procedure (Signed)
TEE: Under deep  sedation, TEE was performed without complications: LV: Normal. Normal EF. RV: Normal LA: Normal. Left atrial appendage: Normal without thrombus. Normal function. Inter atrial septum is intact without defect. Double contrast study negative for atrial level shunting. No late appearance of bubbles either. RA: Normal  MV: Normal Trace MR. TV: Normal Mild  TR, no pulmonary hypertension AV: Normal. No AI or AS. PV: Normal. Trace PI.  Thoracic and ascending aorta: Normal without significant plaque or atheromatous changes.  Sedation monitored by anesthesia.

## 2016-03-07 NOTE — Progress Notes (Signed)
ANTICOAGULATION CONSULT NOTE - Follow-up Consult  Pharmacy Consult for heparin transition to xarelto Indication: atrial fibrillation  Allergies  Allergen Reactions  . Shellfish Allergy Anaphylaxis and Nausea And Vomiting  . Tetracycline Hives, Nausea And Vomiting, Rash and Other (See Comments)    SYSTEMIC REACTION: heart palpitations,muscle spasms, vomiting  . Amlodipine Besylate     REACTION: muscle spasms, extremity swelling, low bp  . Ciprofloxacin     REACTION: muscle spasms, insomnia  . Codeine     REACTION: hallucinations  . Propoxyphene Hcl   . Sulfamethoxazole-Trimethoprim Nausea Only and Other (See Comments)    REACTION: dizzy, could not focus eyes and could not concentrate and nausea  . Tape Other (See Comments)    BANDAIDS TAKE OFF HER SKIN; PLEASE USE COBAN OR PAPER   . Amoxicillin Rash  . 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 Penicillins Swelling and Rash    Has patient had a PCN reaction causing immediate rash, facial/tongue/throat swelling, SOB or lightheadedness with hypotension: Yes Has patient had a PCN reaction causing severe rash involving mucus membranes or skin necrosis: No Has patient had a PCN reaction that required hospitalization No Has patient had a PCN reaction occurring within the last 10 years: No If all of the above answers are "NO", then may proceed with Cephalosporin use.     Patient Measurements: Height: 5\' 5"  (165.1 cm) Weight: 195 lb 1.6 oz (88.497 kg) IBW/kg (Calculated) : 57 Heparin Dosing Weight: 76  Vital Signs: Temp: 98.3 F (36.8 C) (07/13 1334) Temp Source: Oral (07/13 1334) BP: 109/72 mmHg (07/13 1500) Pulse Rate: 65 (07/13 1520)  Labs:  Recent Labs  03/06/16 1633 03/07/16 0354 03/07/16 1307  HGB 14.2  --   --   HCT 41.5  --   --   PLT 360  --   --    HEPARINUNFRC  --  <0.10* 0.37  CREATININE 1.01*  --   --     Estimated Creatinine Clearance: 48.8 mL/min (by C-G formula based on Cr of 1.01).   Assessment: 82 yo F on heparin for new onset atrial fibrillation. Heparin level undetectable on 1000 units/hr. No issues with line or bleeding reported per RN.  Pharmacy is consulted to transition patient from heparin to xarelto for atrial fibrillation. CBC has been wnl and CrCl ~ 55.  Goal of Therapy:  Monitor platelets by anticoagulation protocol: Yes   Plan:  Stop heparin Start xarelto 20mg  PO daily Monitor renal function and s/sx of bleeding Educate pt on xarelto   Clarance Bollard M. Diona Foley, PharmD, [place] Clinical Pharmacist Pager [phone]  03/07/2016, 3:22 PM

## 2016-03-07 NOTE — Consult Note (Signed)
ELECTROPHYSIOLOGY CONSULT NOTE    Patient ID: Linda Mcgrath MRN: [id number], DOB/AGE: 80-Jan-1934 80 y.o.  Admit date: 03/06/2016 Date of Consult: 03/07/2016  Primary Physician: Roxy Manns, MD Primary Cardiologist: Dr. Jacinto Halim Requesting MD: Dr. Jacinto Halim  Reason for Consultation: Aflutter  HPI: Linda Mcgrath is a 82 y.o. female with PMHx of hypothyroidism, HLD, OA, fibromyalgia was referred to the ED by her PMD for tachycardia.  LABS: K+ 3.8 Mag 2.1 BUN/Creat 17/1.01 H/H 14/41 WBC 9.5  11/01/15: TSH 3.13  The patient states Friday of last week she developed an intense electric type pain throughout her torso, back mostly that even laying down made her skin hurt even more.  She saw her PMD Monday for this was started on gabapentin and sent of r imaging/testing.  With onset of this pain which she describes as her skin hurting she also felt SOB  With some exertion though attributed it to her pain.  She eventually started to get some relief with the pain but noted her SOB seemed to be worsening, was seen again by her PMD noted to be very tachycardic and EMS called and brought here.  She was fond in AFlutter with RVR, started on heparin gtt and cardizem gtt with IV lopressor.  The patient states she generally feels tired, hasn't slept well, never has had CP, no overt or perceived symptoms of palpitations.  She continues to get winded with ambulation, no rest SOB.  Past Medical History  Diagnosis Date  . Osteoarthritis   . Fibromyalgia   . Hypertension   . Hypothyroidism   . Hyperlipidemia   . Internal hemorrhoids   . Colon polyp 1999    small polyp  . Diverticulosis   . Lung nodule     stable LUL 9 mm  . Cataract     Bil/lens implant  . Allergic rhinitis   . Leg cramps   . Menopausal syndrome   . UTI (urinary tract infection)      Surgical History:  Past Surgical History  Procedure Laterality Date  . Abdominal hysterectomy  1975    total-fibroids  .  Rotator cuff repair      x 2 /right shoulder  . Skin cancer excision      pre-melanoma / on face  . Breast biopsy      benign  . Cataract extraction      bilateral  . Abd u/s  11/1998    negative  . Abd u/s  01/2001    gallbladder polyps  . Hida scan  01/2001    Negative  . Colonoscopy  1998    Diverticulosis; polyp\  . Dexa  10/2001    osteopenia  . Esophagogastroduodenoscopy  2004  . Colonoscopy  09/2002    Diverticulosis, hem  . Colonoscopy  12/2007    diverticulosis, polyp  . Appendectomy  1975  . Cholecystectomy    . Knee surgery      right knee / 11/2004  . Laser surgery for glaucoma Left Sep 21, 2014     Prescriptions prior to admission  Medication Sig Dispense Refill Last Dose  . albuterol (PROVENTIL HFA;VENTOLIN HFA) 108 (90 BASE) MCG/ACT inhaler Inhale 2 puffs into the lungs every 4 (four) hours as needed for wheezing. 1 Inhaler 5 03/06/2016 at am  . Ascorbic Acid (VITAMIN C) 1000 MG tablet Take 1,000 mg by mouth daily.     03/06/2016 at am  . B Complex Vitamins (VITAMIN B COMPLEX PO) Take 1 tablet by  mouth daily.   03/06/2016 at am  . Biotin 10 MG TABS Take 1 tablet by mouth every morning.    03/06/2016 at am  . Calcium Carbonate-Vit D-Min 600-400 MG-UNIT TABS Take 1 tablet by mouth every morning.    03/06/2016 at am  . chlorhexidine (PERIDEX) 0.12 % solution Use as directed 5-15 mLs in the mouth or throat daily as needed (for breakouts). Reported on 03/06/2016   Past Month at Unknown time  . Cranberry 500 MG CAPS Take 1 capsule by mouth daily.    03/06/2016 at am  . cyclobenzaprine (FLEXERIL) 10 MG tablet Take 1 tablet (10 mg total) by mouth at bedtime as needed. 90 tablet 0 03/05/2016 at pm  . estradiol (ESTRACE) 1 MG tablet Take 1 tablet (1 mg total) by mouth daily. 90 tablet 3 03/06/2016 at am  . fluticasone (FLONASE) 50 MCG/ACT nasal spray Place 1 spray into both nostrils daily as needed for allergies or rhinitis.   Past Month at Unknown time  . folic acid (FOLVITE) 1 MG  tablet Take 1 mg by mouth daily.   03/06/2016 at am  . gabapentin (NEURONTIN) 100 MG capsule Take 1 capsule (100 mg total) by mouth 3 (three) times daily as needed. 90 capsule 0 03/06/2016 at 1200  . ibuprofen (ADVIL,MOTRIN) 200 MG tablet Take 400 mg by mouth at bedtime. For leg pain/discomfort   03/05/2016 at pm  . levothyroxine (SYNTHROID, LEVOTHROID) 88 MCG tablet Take 1 tablet (88 mcg total) by mouth daily with breakfast. 90 tablet 3 03/06/2016 at am  . Lutein 20 MG CAPS Take 1 capsule by mouth daily.    03/06/2016 at am  . methotrexate (RHEUMATREX) 2.5 MG tablet Take 7.5 mg by mouth every 7 (seven) days. [facility])   03/06/2016 at am  . montelukast (SINGULAIR) 10 MG tablet Take 1 tablet (10 mg total) by mouth at bedtime. 90 tablet 3 03/05/2016 at pm  . Multiple Vitamin (MULTIVITAMIN) capsule Take 1 capsule by mouth daily.     03/06/2016 at am  . Pumpkin Seed-Soy Germ (AZO BLADDER CONTROL/GO-LESS PO) Take 310 mg by mouth 3 (three) times daily.   03/06/2016 at am  . TURMERIC PO Take 1 capsule by mouth daily.   03/06/2016 at am  . vitamin B-12 (CYANOCOBALAMIN) 500 MCG tablet Take 500 mcg by mouth daily.   03/06/2016 at am    Inpatient Medications:  . estradiol  1 mg Oral Daily  . levothyroxine  88 mcg Oral QAC breakfast  . metoprolol  5 mg Intravenous Q6H    Allergies:  Allergies  Allergen Reactions  . Shellfish Allergy Anaphylaxis and Nausea And Vomiting  . Tetracycline Hives, Nausea And Vomiting, Rash and Other (See Comments)    SYSTEMIC REACTION: heart palpitations,muscle spasms, vomiting  . Amlodipine Besylate     REACTION: muscle spasms, extremity swelling, low bp  . Ciprofloxacin     REACTION: muscle spasms, insomnia  . Codeine     REACTION: hallucinations  . Propoxyphene Hcl   . Sulfamethoxazole-Trimethoprim Nausea Only and Other (See Comments)    REACTION: dizzy, could not focus eyes and could not concentrate and nausea  . Tape Other (See Comments)    BANDAIDS TAKE OFF HER SKIN;  PLEASE USE COBAN OR PAPER   . Amoxicillin Rash  . Other Swelling, Rash and Other (See Comments)    Has autoimmune disorder and spices erode her salivary glands and affects ankles and mouth DIRECTLY (takes off 1st layer of skin and leaves her unable  to walk)  . Penicillins Swelling and Rash    Has patient had a PCN reaction causing immediate rash, facial/tongue/throat swelling, SOB or lightheadedness with hypotension: Yes Has patient had a PCN reaction causing severe rash involving mucus membranes or skin necrosis: No Has patient had a PCN reaction that required hospitalization No Has patient had a PCN reaction occurring within the last 10 years: No If all of the above answers are "NO", then may proceed with Cephalosporin use.     Social History   Social History  . Marital Status: Married    Spouse Name: N/A  . Number of Children: 2  . Years of Education: N/A   Occupational History  . TRAVEL AGENT    Social History Main Topics  . Smoking status: Never Smoker   . Smokeless tobacco: Never Used  . Alcohol Use: No     Comment: occasional-rare  . Drug Use: No  . Sexual Activity: No   Other Topics Concern  . Not on file   Social History Narrative   Non-smoker      occ alcohol      Married-husband does the cooking      Works outside the home     Family History  Problem Relation Age of Onset  . Parkinsonism Father   . Colon cancer Brother   . Kidney failure Son     congenital  . Allergies Mother   . Heart disease Mother      Review of Systems: All other systems reviewed and are otherwise negative except as noted above.  Physical Exam: Filed Vitals:   03/07/16 0530 03/07/16 0622 03/07/16 0653 03/07/16 0855  BP: 110/89 85/74 94/61    Pulse: 151  42 129  Temp:    97.8 F (36.6 C)  TempSrc:    Oral  Resp: 16 22 23    Height:      Weight:      SpO2: 95%  94% 95%    GEN- The patient is well appearing, alert and oriented x 3 today.   HEENT: normocephalic,  atraumatic; sclera clear, conjunctiva pink; hearing intact; oropharynx clear; neck supple, no JVP Lymph- no cervical lymphadenopathy Lungs- Clear to ausculation bilaterally, normal work of breathing.  No wheezes, rales, rhonchi Heart- Regular tachycardic, no murmurs, rubs or gallops, PMI not laterally displaced GI- soft, non-tender, non-distended, bowel sounds present Extremities- no clubbing, cyanosis, or edema MS- no significant deformity or atrophy Skin- warm and dry, no rash or lesion Psych- euthymic mood, full affect Neuro- no gross deficits observed  Labs:   Lab Results  Component Value Date   WBC 9.5 03/06/2016   HGB 14.2 03/06/2016   HCT 41.5 03/06/2016   MCV 96.7 03/06/2016   PLT 360 03/06/2016    Recent Labs Lab 03/06/16 1633  NA 135  K 3.8  CL 103  CO2 21*  BUN 17  CREATININE 1.01*  CALCIUM 9.0  PROT 6.4*  BILITOT 0.9  ALKPHOS 92  ALT 51  AST 66*  GLUCOSE 96      Radiology/Studies:  Dg Cervical Spine Complete 03/04/2016  CLINICAL DATA:  Paresthesias.  Numbness left upper back EXAM: CERVICAL SPINE - COMPLETE 4+ VIEW COMPARISON:  None. FINDINGS: Diffuse degenerative disc and facet disease in the cervical spine. Slight anterolisthesis of C3 on C4 and C4 on C5 related to facet disease. No fracture. Moderate to severe left neural foraminal narrowing from C3-4 through C5-6. Mild to moderate right neural foraminal narrowing at C3-4 and C4-5. IMPRESSION: Degenerative  changes as above with bilateral neural foraminal narrowing. No acute findings. Electronically Signed   By: Charlett Nose M.D.   On: 03/04/2016 10:29   Ct Head Wo Contrast 03/04/2016  CLINICAL DATA:  Frontal headaches 2 days ago, initial encounter EXAM: CT HEAD WITHOUT CONTRAST TECHNIQUE: Contiguous axial images were obtained from the base of the skull through the vertex without intravenous contrast. COMPARISON:  None. FINDINGS: Bony calvarium is intact. Mild atrophic changes are noted commenced with the  patient's given age. Scattered areas of decreased attenuation are noted in the deep white matter consistent with chronic white matter ischemic change. No findings to suggest acute hemorrhage, acute infarction or space-occupying mass lesion are seen. IMPRESSION: Chronic atrophic and ischemic changes. No acute abnormality is noted. Electronically Signed   By: Alcide Clever M.D.   On: 03/04/2016 14:28   Ct Angio Chest Pe W/cm &/or Wo Cm 03/06/2016  CLINICAL DATA:  Friday last week patient developed severe headaches, shortness of breath, and sharp chest and back pain. Patient saw primary care physician on Monday and had a CT scan. Patient's heart rate is increasing and she is sent to the ED for further evaluation. Atrial fibrillation. Elevated D-dimer. EXAM: CT ANGIOGRAPHY CHEST WITH CONTRAST TECHNIQUE: Multidetector CT imaging of the chest was performed using the standard protocol during bolus administration of intravenous contrast. Multiplanar CT image reconstructions and MIPs were obtained to evaluate the vascular anatomy. CONTRAST:  65 mL Isovue 370 COMPARISON:  None. FINDINGS: Technically adequate examination with good opacification of the central and segmental pulmonary arteries. No focal filling defects are demonstrated. No evidence of significant pulmonary embolus. Mild cardiac enlargement. Small pericardial effusion. Coronary artery calcifications. Normal caliber thoracic aorta. Scattered lymph nodes in the mediastinum are not pathologically enlarged. Largest pretracheal lymph node measures about 10 mm short axis dimension. Esophagus is decompressed. Small bilateral pleural effusions, greater on the left. Consolidation or atelectasis in the left lung base. Solid circumscribed noncalcified nodule in the left upper lung measuring 9 mm diameter. No other focal pulmonary nodules identified. Consider one of the following in 3 months for both low-risk and high-risk individuals: (a) repeat chest CT, (b) follow-up  PET-CT, or (c) tissue sampling. This recommendation follows the consensus statement: Guidelines for Management of Incidental Pulmonary Nodules Detected on CT Images:From the Fleischner Society 2017; published online before print (10.1148/radiol.[id number]). Patchy peripheral interstitial changes in the lungs may represent early edema. No pneumothorax. Included portions of the upper abdominal organs demonstrate surgical absence of the gallbladder. Two hypodense lesions demonstrated in the liver, right posterior lobe at 10 mm and lateral left lobe at 10 mm. Left lobe lesion corresponds with small cyst demonstrated on ultrasound of the liver from 07/13/2009. The right lobe lesion is also probably a cyst although too small to characterize. Degenerative changes throughout the spine. No destructive bone lesions. Review of the MIP images confirms the above findings. IMPRESSION: No evidence of significant pulmonary embolus. Bilateral pleural effusions, greater on the left, with consolidation or atelectasis in the left lung base. Indeterminate 9 mm nodule in the left upper lung. See followup recommendations above. Cardiac enlargement and small pericardial effusion. Probable hepatic cysts. Electronically Signed   By: Burman Nieves M.D.   On: 03/06/2016 21:26   Dg Chest Portable 1 View 03/06/2016  CLINICAL DATA:  Shortness of breath and tachycardia tonight.  Cough. EXAM: PORTABLE CHEST 1 VIEW COMPARISON:  12/11/2015 FINDINGS: The heart is mildly enlarged but stable. Moderate tortuosity and ectasia of the thoracic aorta. There  is a left lower lobe process likely a combination of infiltrate, atelectasis and effusion. The right lung is clear. The bony thorax is intact. IMPRESSION: Left lower lobe opacity likely a combination of infiltrate, atelectasis and effusion. Followup PA and lateral chest X-ray is recommended in 3-4 weeks following trial of antibiotic therapy to ensure resolution and exclude underlying malignancy.  Electronically Signed   By: Rudie Meyer M.D.   On: 03/06/2016 18:34    EKG: Aflutter, 150bpm EKG with pause is s/p adenosine  TELEMETRY: Aflutter, 120-150bpm  Echocardiogram is pending   Assessment and Plan:   1. New onset Aflutter, appears typical     onsest suspect to be about 5 days ago by symptoms     CHA2DS2Vasc is at least 3 on heparin gtt     Agree with rate control strategy given unknown onset and no prior anticoagulation     BP has limited options for meds     Recommend TEE/DCCV here and out patient EP f/u for discussion regarding ablation therapy     Dr. Elberta Fortis discussed with the patient, she is willing to proceed, she has been NPO today     Dr. Elberta Fortis Janeece Blok discuss with Dr. Jacinto Halim   2. Fluid OL on CT scan     No rest SOB     Deferred to primary cardiology        Signed, Francis Dowse, PA-C 03/07/2016 9:01 AM  I have seen and examined this patient with Francis Dowse.  Agree with above, note added to reflect my findings.  On exam, regular rhythm, no murmurs, lungs clear.  Presented to PCP with tachycardia, found to be in atrial flutter.  Has been in flutter for possibly 5 days prior to anticoagulation.  Has been on heparin since that time.  Currently with SOB with exertion.  Due to symptoms, would likely benefit from TEE/Cardioversion. Oseas Detty work to get this organized.  She Nyana Haren be switched to NOAC after cardioversion at discharge.  Keyarra Rendall have her follow up in EP clinic for discussion of ablation.  Albina Gosney M. Nigil Braman MD 03/07/2016 10:13 AM

## 2016-03-07 NOTE — Anesthesia Preprocedure Evaluation (Addendum)
Anesthesia Evaluation  Patient identified by MRN, date of birth, ID band Patient awake    Reviewed: Allergy & Precautions, NPO status , Patient's Chart, lab work & pertinent test results  History of Anesthesia Complications Negative for: history of anesthetic complications  Airway Mallampati: II  TM Distance: >3 FB Neck ROM: Full    Dental  (+) Teeth Intact, Dental Advisory Given, Missing,    Pulmonary shortness of breath,  Pulmonary edema with sob, oxygen   + rhonchi        Cardiovascular hypertension, Pt. on medications +CHF  + dysrhythmias Atrial Fibrillation  Rhythm:Irregular Rate:Tachycardia     Neuro/Psych  Headaches,  Neuromuscular disease negative psych ROS   GI/Hepatic negative GI ROS, Neg liver ROS,   Endo/Other  Hypothyroidism Morbid obesity  Renal/GU Renal InsufficiencyRenal disease     Musculoskeletal  (+) Arthritis , Fibromyalgia -  Abdominal   Peds  Hematology   Anesthesia Other Findings   Reproductive/Obstetrics                            Anesthesia Physical Anesthesia Plan  ASA: III  Anesthesia Plan: MAC   Post-op Pain Management:    Induction: Intravenous  Airway Management Planned: Nasal Cannula and Simple Face Mask  Additional Equipment: None  Intra-op Plan:   Post-operative Plan:   Informed Consent: I have reviewed the patients History and Physical, chart, labs and discussed the procedure including the risks, benefits and alternatives for the proposed anesthesia with the patient or authorized representative who has indicated his/her understanding and acceptance.   Dental advisory given  Plan Discussed with: CRNA and Surgeon  Anesthesia Plan Comments:         Anesthesia Quick Evaluation

## 2016-03-08 LAB — ECHO TEE
Reg peak vel: 205 cm/s
TR max vel: 205 cm/s

## 2016-03-08 LAB — CBC
HCT: 35.7 % — ABNORMAL LOW (ref 36.0–46.0)
Hemoglobin: 11.8 g/dL — ABNORMAL LOW (ref 12.0–15.0)
MCH: 32.6 pg (ref 26.0–34.0)
MCHC: 33.1 g/dL (ref 30.0–36.0)
MCV: 98.6 fL (ref 78.0–100.0)
Platelets: 367 10*3/uL (ref 150–400)
RBC: 3.62 MIL/uL — ABNORMAL LOW (ref 3.87–5.11)
RDW: 14.1 % (ref 11.5–15.5)
WBC: 7.8 10*3/uL (ref 4.0–10.5)

## 2016-03-08 LAB — BASIC METABOLIC PANEL
Anion gap: 7 (ref 5–15)
BUN: 14 mg/dL (ref 6–20)
CO2: 26 mmol/L (ref 22–32)
Calcium: 8.6 mg/dL — ABNORMAL LOW (ref 8.9–10.3)
Chloride: 103 mmol/L (ref 101–111)
Creatinine, Ser: 0.89 mg/dL (ref 0.44–1.00)
GFR calc Af Amer: >60 mL/min (ref >60)
GFR calc non Af Amer: 60 mL/min — ABNORMAL LOW (ref >60)
Glucose, Bld: 106 mg/dL — ABNORMAL HIGH (ref 65–99)
Potassium: 4.9 mmol/L (ref 3.5–5.1)
Sodium: 136 mmol/L (ref 135–145)

## 2016-03-08 MED ORDER — METOPROLOL TARTRATE 25 MG PO TABS: 25.0000 mg | ORAL_TABLET | Freq: Two times a day (BID) | ORAL | Status: DC

## 2016-03-08 MED ORDER — RIVAROXABAN 20 MG PO TABS: 20.0000 mg | ORAL_TABLET | Freq: Every day | ORAL | Status: DC

## 2016-03-08 MED ORDER — FLECAINIDE ACETATE 100 MG PO TABS: 100.0000 mg | ORAL_TABLET | Freq: Two times a day (BID) | ORAL | Status: DC

## 2016-03-08 NOTE — Care Management Note (Addendum)
Case Management Note  Patient Details  Name: Linda Mcgrath MRN: [id number] Date of Birth: [birth year]  Subjective/Objective:  Pt presented for A flutter. Plan for d/c today. Pt will need Xarelto-Benefits check in process and CM will make pt aware once completed.   Action/Plan: CM will provide pt with 30 day free card. CM did call [facility] in [place] and medication is available.   Co-pay for Xeralto 20 mg po daily is:$45.00 per 30 days supply,PA is required @ [phone].  In network pharmacies are[facility]   No further needs @ this time.   Expected Discharge Date:                  Expected Discharge Plan:  Home/Self Care  In-House Referral:  NA  Discharge planning Services  CM Consult, Medication Assistance  Post Acute Care Choice:  NA Choice offered to:  NA  DME Arranged:  N/A DME Agency:  NA  HH Arranged:  NA HH Agency:  NA  Status of Service:  Completed, signed off  If discussed at [place] of Stay Meetings, dates discussed:    Additional Comments:  Bethena Roys, RN 03/08/2016, 9:41 AM

## 2016-03-08 NOTE — Progress Notes (Signed)
Pt discharged per MD order. IV and telemetry discontinued per MD order. Pt has received discharge education and instructions and verbalized understanding. Pt escorted to car by volunteer. Rosana Fret RN

## 2016-03-08 NOTE — Discharge Instructions (Addendum)
Information on my medicine - XARELTO (Rivaroxaban)  This medication education was reviewed with me or my healthcare representative as part of my discharge preparation.   Why was Xarelto prescribed for you? Xarelto was prescribed for you to reduce the risk of a blood clot forming that can cause a stroke if you have a medical condition called atrial fibrillation (a type of irregular heartbeat).  What do you need to know about xarelto ? Take your Xarelto ONCE DAILY at the same time every day with your evening meal. If you have difficulty swallowing the tablet whole, you may crush it and mix in applesauce just prior to taking your dose.  Take Xarelto exactly as prescribed by your doctor and DO NOT stop taking Xarelto without talking to the doctor who prescribed the medication.  Stopping without other stroke prevention medication to take the place of Xarelto may increase your risk of developing a clot that causes a stroke.  Refill your prescription before you run out.  After discharge, you should have regular check-up appointments with your healthcare provider that is prescribing your Xarelto.  In the future your dose may need to be changed if your kidney function or weight changes by a significant amount.  What do you do if you miss a dose? If you are taking Xarelto ONCE DAILY and you miss a dose, take it as soon as you remember on the same day then continue your regularly scheduled once daily regimen the next day. Do not take two doses of Xarelto at the same time or on the same day.   Important Safety Information A possible side effect of Xarelto is bleeding. You should call your healthcare provider right away if you experience any of the following: ? Bleeding from an injury or your nose that does not stop. ? Unusual colored urine (red or dark brown) or unusual colored stools (red or black). ? Unusual bruising for unknown reasons. ? A serious fall or if you hit your head (even if  there is no bleeding).  Some medicines may interact with Xarelto and might increase your risk of bleeding while on Xarelto. To help avoid this, consult your healthcare provider or pharmacist prior to using any new prescription or non-prescription medications, including herbals, vitamins, non-steroidal anti-inflammatory drugs (NSAIDs) and supplements.  This website has more information on Xarelto: [email or web address]   Atrial Flutter Atrial flutter is a heart rhythm that can cause the heart to beat very fast (tachycardia). It originates in the upper chambers of the heart (atria). In atrial flutter, the top chambers of the heart (atria) often beat much faster than the bottom chambers of the heart (ventricles). Atrial flutter has a regular "saw toothed" appearance in an EKG readout. An EKG is a test that records the electrical activity of the heart. Atrial flutter can cause the heart to beat up to 150 beats per minute (BPM). Atrial flutter can either be short lived (paroxysmal) or permanent.  CAUSES  Causes of atrial flutter can be many. Some of these include:  Heart related issues:  Heart attack (myocardial infarction).  Heart failure.  Heart valve problems.  Poorly controlled high blood pressure (hypertension).  After open heart surgery.  Lung related issues:  A blood clot in the lungs (pulmonary embolism).  Chronic obstructive pulmonary disease (COPD). Medications used to treat COPD can attribute to atrial flutter.  Other related causes:  Hyperthyroidism.  Caffeine.  Some decongestant cold medications.  Low electrolyte levels such as potassium or magnesium.  Cocaine. SYMPTOMS  An awareness of your heart beating rapidly (palpitations).  Shortness of breath.  Chest pain.  Low blood pressure (hypotension).  Dizziness or fainting. DIAGNOSIS  Different tests can be performed to diagnose atrial flutter.   An EKG.  Holter monitor. This is a 24-hour recording of your  heart rhythm. You will also be given a diary. Write down all symptoms that you have and what you were doing at the time you experienced symptoms.  Cardiac event monitor. This small device can be worn for up to 30 days. When you have heart symptoms, you will push a button on the device. This will then record your heart rhythm.  Echocardiogram. This is an imaging test to look at your heart. Your caregiver will look at your heart valves and the ventricles.  Stress test. This test can help determine if the atrial flutter is related to exercise or if coronary artery disease is present.  Laboratory studies will look at certain blood levels like:  Complete blood count (CBC).  Potassium.  Magnesium.  Thyroid function. TREATMENT  Treatment of atrial flutter varies. A combination of therapies may be used or sometimes atrial flutter may need only 1 type of treatment.  Lab work: If your blood work, such as your electrolytes (potassium, magnesium) or your thyroid function tests, are abnormal, your caregiver will treat them accordingly.  Medication:  There are several different types of medications that can convert your heart to a normal rhythm and prevent atrial flutter from reoccurring.  Nonsurgical procedures: Nonsurgical techniques may be used to control atrial flutter. Some examples include:  Cardioversion. This technique uses either drugs or an electrical shock to restore a normal heart rhythm:  Cardioversion drugs may be given through an intravenous (IV) line to help "reset" the heart rhythm.  In electrical cardioversion, your caregiver shocks your heart with electrical energy. This helps to reset the heartbeat to a normal rhythm.  Ablation. If atrial flutter is a persistent problem, an ablation may be needed. This procedure is done under mild sedation. High frequency radio-wave energy is used to destroy the area of heart tissue responsible for atrial flutter. SEEK IMMEDIATE MEDICAL CARE  IF:  You have:  Dizziness.  Near fainting or fainting.  Shortness of breath.  Chest pain or pressure.  Sudden nausea or vomiting.  Profuse sweating. If you have the above symptoms, call your local emergency service immediately! Do not drive yourself to the hospital. MAKE SURE YOU:   Understand these instructions.  Will watch your condition.  Will get help right away if you are not doing well or get worse.   This information is not intended to replace advice given to you by your health care provider. Make sure you discuss any questions you have with your health care provider.   Document Released: 12/29/2008 Document Revised: 09/02/2014 Document Reviewed: 02/24/2015 Elsevier Interactive Patient Education 2016 [employer].  Atrial Flutter Atrial flutter is a heart rhythm that can cause the heart to beat very fast (tachycardia). It originates in the upper chambers of the heart (atria). In atrial flutter, the top chambers of the heart (atria) often beat much faster than the bottom chambers of the heart (ventricles). Atrial flutter has a regular "saw toothed" appearance in an EKG readout. An EKG is a test that records the electrical activity of the heart. Atrial flutter can cause the heart to beat up to 150 beats per minute (BPM). Atrial flutter can either be short lived (paroxysmal) or permanent.  CAUSES  Causes of  atrial flutter can be many. Some of these include:  Heart related issues:  Heart attack (myocardial infarction).  Heart failure.  Heart valve problems.  Poorly controlled high blood pressure (hypertension).  After open heart surgery.  Lung related issues:  A blood clot in the lungs (pulmonary embolism).  Chronic obstructive pulmonary disease (COPD). Medications used to treat COPD can attribute to atrial flutter.  Other related causes:  Hyperthyroidism.  Caffeine.  Some decongestant cold medications.  Low electrolyte levels such as potassium or  magnesium.  Cocaine. SYMPTOMS  An awareness of your heart beating rapidly (palpitations).  Shortness of breath.  Chest pain.  Low blood pressure (hypotension).  Dizziness or fainting. DIAGNOSIS  Different tests can be performed to diagnose atrial flutter.   An EKG.  Holter monitor. This is a 24-hour recording of your heart rhythm. You will also be given a diary. Write down all symptoms that you have and what you were doing at the time you experienced symptoms.  Cardiac event monitor. This small device can be worn for up to 30 days. When you have heart symptoms, you will push a button on the device. This will then record your heart rhythm.  Echocardiogram. This is an imaging test to look at your heart. Your caregiver will look at your heart valves and the ventricles.  Stress test. This test can help determine if the atrial flutter is related to exercise or if coronary artery disease is present.  Laboratory studies will look at certain blood levels like:  Complete blood count (CBC).  Potassium.  Magnesium.  Thyroid function. TREATMENT  Treatment of atrial flutter varies. A combination of therapies may be used or sometimes atrial flutter may need only 1 type of treatment.  Lab work: If your blood work, such as your electrolytes (potassium, magnesium) or your thyroid function tests, are abnormal, your caregiver will treat them accordingly.  Medication:  There are several different types of medications that can convert your heart to a normal rhythm and prevent atrial flutter from reoccurring.  Nonsurgical procedures: Nonsurgical techniques may be used to control atrial flutter. Some examples include:  Cardioversion. This technique uses either drugs or an electrical shock to restore a normal heart rhythm:  Cardioversion drugs may be given through an intravenous (IV) line to help "reset" the heart rhythm.  In electrical cardioversion, your caregiver shocks your heart with  electrical energy. This helps to reset the heartbeat to a normal rhythm.  Ablation. If atrial flutter is a persistent problem, an ablation may be needed. This procedure is done under mild sedation. High frequency radio-wave energy is used to destroy the area of heart tissue responsible for atrial flutter. SEEK IMMEDIATE MEDICAL CARE IF:  You have:  Dizziness.  Near fainting or fainting.  Shortness of breath.  Chest pain or pressure.  Sudden nausea or vomiting.  Profuse sweating. If you have the above symptoms, call your local emergency service immediately! Do not drive yourself to the hospital. MAKE SURE YOU:   Understand these instructions.  Will watch your condition.  Will get help right away if you are not doing well or get worse.   This information is not intended to replace advice given to you by your health care provider. Make sure you discuss any questions you have with your health care provider.   Document Released: 12/29/2008 Document Revised: 09/02/2014 Document Reviewed: 02/24/2015 Elsevier Interactive Patient Education [employer].

## 2016-03-08 NOTE — Discharge Summary (Signed)
Physician Discharge Summary  Patient ID: Linda Mcgrath MRN: [id number] DOB/AGE: [birth year] 82 y.o.  Admit date: 03/06/2016 Discharge date: 03/08/2016  Primary Discharge Diagnosis Atrial flutter ((CHA2DS2-VASCScore: Risk Score 3, Yearly risk of stroke 3.2%. Recommendation: ASA No/Anticoagulation yes)  Significant Diagnostic Studies: TEE 03/07/16: Normal study Cardioversion: 03/07/2016: 50J  A. Flutter to NST.  Hospital Course: Patient admitted with dyspnea, fatigue and found to be in A. Flutter with 2:1 conduction. Unable to control HR in spite of IV Dilt and Lopressor, underwent TEE guided cardioversion and started on Flecainide. Maintained sinus upon discharge.  Recommendations on discharge: F/U Dr. Loman [place] for consideration of A. Flutter ablation. Started Xarelto for anticoagulation until this is done. D/C on Metoprolol. OP f/u with me in 2 weeks.  Discharge Exam: Blood pressure 121/60, pulse 76, temperature 98 F (36.7 C), temperature source Oral, resp. rate 18, height 5\' 5"  (1.651 m), weight 88.27 kg (194 lb 9.6 oz), last menstrual period 08/26/1969, SpO2 95 %.  General appearance: alert, cooperative, appears stated age and no distress Resp: clear to auscultation bilaterally Cardio: regular rate and rhythm, S1, S2 normal, no murmur, click, rub or gallop GI: soft, non-tender; bowel sounds normal; no masses,  no organomegaly Extremities: extremities normal, atraumatic, no cyanosis or edema Pulses: 2+ and symmetric Neurologic: Grossly normal Labs:   Lab Results  Component Value Date   WBC 7.8 03/08/2016   HGB 11.8* 03/08/2016   HCT 35.7* 03/08/2016   MCV 98.6 03/08/2016   PLT 367 03/08/2016    Recent Labs Lab 03/06/16 1633 03/08/16 0508  NA 135 136  K 3.8 4.9  CL 103 103  CO2 21* 26  BUN 17 14  CREATININE 1.01* 0.89  CALCIUM 9.0 8.6*  PROT 6.4*  --   BILITOT 0.9  --   ALKPHOS 92  --   ALT 51  --   AST 66*  --   GLUCOSE 96 106*    Lipid Panel      Component Value Date/Time   CHOL 200 11/01/2015 1152   TRIG 107.0 11/01/2015 1152   HDL 72.60 11/01/2015 1152   CHOLHDL 3 11/01/2015 1152   VLDL 21.4 11/01/2015 1152   LDLCALC 106* 11/01/2015 1152    Recent Labs  11/01/15 1152  TSH 3.13    EKG 03/08/2016: normal EKG, normal sinus rhythm. Normal QT.  EKG 03/06/2016 at 1854: a flutter vs coarse a.fib with RVR at a rate of 150 bpm, PRWP  EKG 03/06/2016 at 1855 post adenosine administration: atrial flutter evident, immediately resumed RVR with 2:1 conduction.    Radiology: Dg Cervical Spine Complete  03/04/2016  CLINICAL DATA:  Paresthesias.  Numbness left upper back EXAM: CERVICAL SPINE - COMPLETE 4+ VIEW COMPARISON:  None. FINDINGS: Diffuse degenerative disc and facet disease in the cervical spine. Slight anterolisthesis of C3 on C4 and C4 on C5 related to facet disease. No fracture. Moderate to severe left neural foraminal narrowing from C3-4 through C5-6. Mild to moderate right neural foraminal narrowing at C3-4 and C4-5. IMPRESSION: Degenerative changes as above with bilateral neural foraminal narrowing. No acute findings. Electronically Signed   By: Charlett Nose M.D.   On: 03/04/2016 10:29   Ct Head Wo Contrast  03/04/2016  CLINICAL DATA:  Frontal headaches 2 days ago, initial encounter EXAM: CT HEAD WITHOUT CONTRAST TECHNIQUE: Contiguous axial images were obtained from the base of the skull through the vertex without intravenous contrast. COMPARISON:  None. FINDINGS: Bony calvarium is intact. Mild atrophic changes are noted commenced  with the patient's given age. Scattered areas of decreased attenuation are noted in the deep white matter consistent with chronic white matter ischemic change. No findings to suggest acute hemorrhage, acute infarction or space-occupying mass lesion are seen. IMPRESSION: Chronic atrophic and ischemic changes. No acute abnormality is noted. Electronically Signed   By: Alcide Clever M.D.   On: 03/04/2016 14:28    Ct Angio Chest Pe W/cm &/or Wo Cm  03/06/2016  CLINICAL DATA:  Friday last week patient developed severe headaches, shortness of breath, and sharp chest and back pain. Patient saw primary care physician on Monday and had a CT scan. Patient's heart rate is increasing and she is sent to the ED for further evaluation. Atrial fibrillation. Elevated D-dimer. EXAM: CT ANGIOGRAPHY CHEST WITH CONTRAST TECHNIQUE: Multidetector CT imaging of the chest was performed using the standard protocol during bolus administration of intravenous contrast. Multiplanar CT image reconstructions and MIPs were obtained to evaluate the vascular anatomy. CONTRAST:  65 mL Isovue 370 COMPARISON:  None. FINDINGS: Technically adequate examination with good opacification of the central and segmental pulmonary arteries. No focal filling defects are demonstrated. No evidence of significant pulmonary embolus. Mild cardiac enlargement. Small pericardial effusion. Coronary artery calcifications. Normal caliber thoracic aorta. Scattered lymph nodes in the mediastinum are not pathologically enlarged. Largest pretracheal lymph node measures about 10 mm short axis dimension. Esophagus is decompressed. Small bilateral pleural effusions, greater on the left. Consolidation or atelectasis in the left lung base. Solid circumscribed noncalcified nodule in the left upper lung measuring 9 mm diameter. No other focal pulmonary nodules identified. Consider one of the following in 3 months for both low-risk and high-risk individuals: (a) repeat chest CT, (b) follow-up PET-CT, or (c) tissue sampling. This recommendation follows the consensus statement: Guidelines for Management of Incidental Pulmonary Nodules Detected on CT Images:From the Fleischner Society 2017; published online before print (10.1148/radiol.[id number]). Patchy peripheral interstitial changes in the lungs may represent early edema. No pneumothorax. Included portions of the upper abdominal organs  demonstrate surgical absence of the gallbladder. Two hypodense lesions demonstrated in the liver, right posterior lobe at 10 mm and lateral left lobe at 10 mm. Left lobe lesion corresponds with small cyst demonstrated on ultrasound of the liver from 07/13/2009. The right lobe lesion is also probably a cyst although too small to characterize. Degenerative changes throughout the spine. No destructive bone lesions. Review of the MIP images confirms the above findings. IMPRESSION: No evidence of significant pulmonary embolus. Bilateral pleural effusions, greater on the left, with consolidation or atelectasis in the left lung base. Indeterminate 9 mm nodule in the left upper lung. See followup recommendations above. Cardiac enlargement and small pericardial effusion. Probable hepatic cysts. Electronically Signed   By: Burman Nieves M.D.   On: 03/06/2016 21:26   Dg Chest Portable 1 View  03/06/2016  CLINICAL DATA:  Shortness of breath and tachycardia tonight.  Cough. EXAM: PORTABLE CHEST 1 VIEW COMPARISON:  12/11/2015 FINDINGS: The heart is mildly enlarged but stable. Moderate tortuosity and ectasia of the thoracic aorta. There is a left lower lobe process likely a combination of infiltrate, atelectasis and effusion. The right lung is clear. The bony thorax is intact. IMPRESSION: Left lower lobe opacity likely a combination of infiltrate, atelectasis and effusion. Followup PA and lateral chest X-ray is recommended in 3-4 weeks following trial of antibiotic therapy to ensure resolution and exclude underlying malignancy. Electronically Signed   By: Rudie Meyer M.D.   On: 03/06/2016 18:34  FOLLOW UP PLANS AND APPOINTMENTS    Medication List    TAKE these medications        albuterol 108 (90 Base) MCG/ACT inhaler  Commonly known as:  PROVENTIL HFA;VENTOLIN HFA  Inhale 2 puffs into the lungs every 4 (four) hours as needed for wheezing.     AZO BLADDER CONTROL/GO-LESS PO  Take 310 mg by mouth 3  (three) times daily.     Biotin 10 MG Tabs  Take 1 tablet by mouth every morning.     Calcium Carbonate-Vit D-Min 600-400 MG-UNIT Tabs  Take 1 tablet by mouth every morning.     chlorhexidine 0.12 % solution  Commonly known as:  PERIDEX  Use as directed 5-15 mLs in the mouth or throat daily as needed (for breakouts). Reported on 03/06/2016     Cranberry 500 MG Caps  Take 1 capsule by mouth daily.     cyclobenzaprine 10 MG tablet  Commonly known as:  FLEXERIL  Take 1 tablet (10 mg total) by mouth at bedtime as needed.     estradiol 1 MG tablet  Commonly known as:  ESTRACE  Take 1 tablet (1 mg total) by mouth daily.     flecainide 100 MG tablet  Commonly known as:  TAMBOCOR  Take 1 tablet (100 mg total) by mouth every 12 (twelve) hours.     fluticasone 50 MCG/ACT nasal spray  Commonly known as:  FLONASE  Place 1 spray into both nostrils daily as needed for allergies or rhinitis.     folic acid 1 MG tablet  Commonly known as:  FOLVITE  Take 1 mg by mouth daily.     gabapentin 100 MG capsule  Commonly known as:  NEURONTIN  Take 1 capsule (100 mg total) by mouth 3 (three) times daily as needed.     ibuprofen 200 MG tablet  Commonly known as:  ADVIL,MOTRIN  Take 400 mg by mouth at bedtime. For leg pain/discomfort     levothyroxine 88 MCG tablet  Commonly known as:  SYNTHROID, LEVOTHROID  Take 1 tablet (88 mcg total) by mouth daily with breakfast.     Lutein 20 MG Caps  Take 1 capsule by mouth daily.     methotrexate 2.5 MG tablet  Commonly known as:  RHEUMATREX  Take 7.5 mg by mouth every 7 (seven) days. [facility])     metoprolol tartrate 25 MG tablet  Commonly known as:  LOPRESSOR  Take 1 tablet (25 mg total) by mouth 2 (two) times daily.     montelukast 10 MG tablet  Commonly known as:  SINGULAIR  Take 1 tablet (10 mg total) by mouth at bedtime.     multivitamin capsule  Take 1 capsule by mouth daily.     rivaroxaban 20 MG Tabs tablet  Commonly known as:   XARELTO  Take 1 tablet (20 mg total) by mouth daily with supper.     TURMERIC PO  Take 1 capsule by mouth daily.     VITAMIN B COMPLEX PO  Take 1 tablet by mouth daily.     vitamin B-12 500 MCG tablet  Commonly known as:  CYANOCOBALAMIN  Take 500 mcg by mouth daily.     vitamin C 1000 MG tablet  Take 1,000 mg by mouth daily.           Follow-up Information    Follow up with Mcgrath Decamp, MD On 03/18/2016.   Specialty:  Cardiology   Why:  Appointment at 2 pm. To  arrive 1:30. Bring all medications   Contact information:   [address] [place] [address] [phone]       Call Will [name], MD.   Specialty:  Cardiology   Why:  To schedule for A. FLutter ablation   Contact information:   [address] [place] [address] [phone]        Mcgrath Decamp, MD 03/08/2016, 9:05 AM  Pager: [phone] Office: [phone] If no answer: [phone]

## 2016-03-11 ENCOUNTER — Encounter (HOSPITAL_COMMUNITY): Payer: Self-pay

## 2016-03-11 ENCOUNTER — Ambulatory Visit (HOSPITAL_COMMUNITY): Admit: 2016-03-11 | Payer: Self-pay | Admitting: Cardiology

## 2016-03-11 SURGERY — ECHOCARDIOGRAM, TRANSESOPHAGEAL
Anesthesia: Monitor Anesthesia Care

## 2016-03-11 NOTE — Anesthesia Postprocedure Evaluation (Signed)
Anesthesia Post Note  Patient: Janaiya B Martin-Higgins  Procedure(s) Performed: Procedure(s) (LRB): TRANSESOPHAGEAL ECHOCARDIOGRAM (TEE) (N/A) CARDIOVERSION (N/A)  Patient location during evaluation: Endoscopy Anesthesia Type: MAC Level of consciousness: awake Pain management: pain level controlled Vital Signs Assessment: post-procedure vital signs reviewed and stable Respiratory status: spontaneous breathing Cardiovascular status: stable Postop Assessment: no signs of nausea or vomiting Anesthetic complications: no    Last Vitals:  Filed Vitals:   03/07/16 2122 03/08/16 0522  BP: 119/63 121/60  Pulse: 75 76  Temp: 36.6 C 36.7 C  Resp: 18 18    Last Pain:  Filed Vitals:   03/08/16 0816  PainSc: 0-No pain                 Lexxie Winberg

## 2016-03-12 DIAGNOSIS — I4892 Unspecified atrial flutter: Secondary | ICD-10-CM | POA: Diagnosis not present

## 2016-03-12 DIAGNOSIS — R5383 Other fatigue: Secondary | ICD-10-CM | POA: Diagnosis not present

## 2016-03-13 ENCOUNTER — Encounter: Payer: Self-pay | Admitting: Cardiology

## 2016-03-13 ENCOUNTER — Ambulatory Visit (INDEPENDENT_AMBULATORY_CARE_PROVIDER_SITE_OTHER): Payer: Medicare Other | Admitting: Cardiology

## 2016-03-13 VITALS — BP 126/72 | HR 58 | Ht 65.0 in | Wt 199.2 lb

## 2016-03-13 DIAGNOSIS — I483 Typical atrial flutter: Secondary | ICD-10-CM | POA: Diagnosis not present

## 2016-03-13 LAB — CBC WITH DIFFERENTIAL/PLATELET
Basophils Absolute: 99 {cells}/uL (ref 0–200)
Basophils Relative: 1 %
Eosinophils Absolute: 396 {cells}/uL (ref 15–500)
Eosinophils Relative: 4 %
HCT: 38.7 % (ref 35.0–45.0)
Hemoglobin: 13.3 g/dL (ref 11.7–15.5)
Lymphocytes Relative: 36 %
Lymphs Abs: 3564 {cells}/uL (ref 850–3900)
MCH: 33.3 pg — ABNORMAL HIGH (ref 27.0–33.0)
MCHC: 34.4 g/dL (ref 32.0–36.0)
MCV: 96.8 fL (ref 80.0–100.0)
MPV: 9.6 fL (ref 7.5–12.5)
Monocytes Absolute: 1089 {cells}/uL — ABNORMAL HIGH (ref 200–950)
Monocytes Relative: 11 %
Neutro Abs: 4752 {cells}/uL (ref 1500–7800)
Neutrophils Relative %: 48 %
Platelets: 428 K/uL — ABNORMAL HIGH (ref 140–400)
RBC: 4 MIL/uL (ref 3.80–5.10)
RDW: 13.9 % (ref 11.0–15.0)
WBC: 9.9 K/uL (ref 3.8–10.8)

## 2016-03-13 LAB — BASIC METABOLIC PANEL
BUN: 15 mg/dL (ref 7–25)
CO2: 24 mmol/L (ref 20–31)
Calcium: 9.1 mg/dL (ref 8.6–10.4)
Chloride: 104 mmol/L (ref 98–110)
Creat: 1 mg/dL — ABNORMAL HIGH (ref 0.60–0.88)
Glucose, Bld: 101 mg/dL — ABNORMAL HIGH (ref 65–99)
Potassium: 4.5 mmol/L (ref 3.5–5.3)
Sodium: 140 mmol/L (ref 135–146)

## 2016-03-13 NOTE — Progress Notes (Signed)
Electrophysiology Office Note   Date:  03/13/2016   ID:  Linda Mcgrath, DOB 09/08/1932, MRN [id number]  PCP:  Roxy Manns, MD  Cardiologist:  Jacinto Halim Primary Electrophysiologist:  Consepcion Utt [name], MD    Chief Complaint  Patient presents with  . Hospitalization Follow-up    discuss ablation  . Atrial Flutter     History of Present Illness: Linda Mcgrath is a 82 y.o. female who presents today for electrophysiology evaluation.   PMHx of hypothyroidism, HLD, OA, fibromyalgia was referred to the ED by her PMD for tachycardia.  2 weeks ago, she developed an intense electric type pain throughout her torso, back mostly that even laying down made her skin hurt even more. She saw her PMD Monday for this was started on gabapentin and sent of r imaging/testing. With onset of this pain which she describes as her skin hurting she also felt SOB With some exertion though attributed it to her pain. She eventually started to get some relief with the pain but noted her SOB seemed to be worsening, was seen again by her PMD noted to be very tachycardic and EMS called and brought here. She was fond in AFlutter with RVR, started on heparin gtt and cardizem gtt with IV lopressor.  She had TEE/CV on 03/07/16 for atrial flutter.  She was discharged on Xarelto for anticoagulation  Today, she denies symptoms of palpitations, chest pain, shortness of breath, orthopnea, PND, lower extremity edema, claudication, dizziness, presyncope, syncope, bleeding, or neurologic sequela. The patient is tolerating medications without difficulties and is otherwise without complaint today.    Past Medical History  Diagnosis Date  . Osteoarthritis   . Fibromyalgia   . Hypertension   . Hypothyroidism   . Hyperlipidemia   . Internal hemorrhoids   . Colon polyp 1999    small polyp  . Diverticulosis   . Lung nodule     stable LUL 9 mm  . Cataract     Bil/lens implant  . Allergic rhinitis   . Leg  cramps   . Menopausal syndrome   . UTI (urinary tract infection)    Past Surgical History  Procedure Laterality Date  . Abdominal hysterectomy  1975    total-fibroids  . Rotator cuff repair      x 2 /right shoulder  . Skin cancer excision      pre-melanoma / on face  . Breast biopsy      benign  . Cataract extraction      bilateral  . Abd u/s  11/1998    negative  . Abd u/s  01/2001    gallbladder polyps  . Hida scan  01/2001    Negative  . Colonoscopy  1998    Diverticulosis; polyp\  . Dexa  10/2001    osteopenia  . Esophagogastroduodenoscopy  2004  . Colonoscopy  09/2002    Diverticulosis, hem  . Colonoscopy  12/2007    diverticulosis, polyp  . Appendectomy  1975  . Cholecystectomy    . Knee surgery      right knee / 11/2004  . Laser surgery for glaucoma Left Sep 21, 2014  . Tee without cardioversion N/A 03/07/2016    Procedure: TRANSESOPHAGEAL ECHOCARDIOGRAM (TEE);  Surgeon: Yates Decamp, MD;  Location: [facility] ENDOSCOPY;  Service: Cardiovascular;  Laterality: N/A;  . Cardioversion N/A 03/07/2016    Procedure: CARDIOVERSION;  Surgeon: Yates Decamp, MD;  Location: [facility] ENDOSCOPY;  Service: Cardiovascular;  Laterality: N/A;     Current Outpatient  Prescriptions  Medication Sig Dispense Refill  . albuterol (PROVENTIL HFA;VENTOLIN HFA) 108 (90 BASE) MCG/ACT inhaler Inhale 2 puffs into the lungs every 4 (four) hours as needed for wheezing. 1 Inhaler 5  . Ascorbic Acid (VITAMIN C) 1000 MG tablet Take 1,000 mg by mouth daily.      . B Complex Vitamins (VITAMIN B COMPLEX PO) Take 1 tablet by mouth daily.    . Biotin 10 MG TABS Take 1 tablet by mouth every morning.     . Calcium Carbonate-Vit D-Min 600-400 MG-UNIT TABS Take 1 tablet by mouth every morning.     . chlorhexidine (PERIDEX) 0.12 % solution Use as directed 5-15 mLs in the mouth or throat daily as needed (for breakouts). Reported on 03/06/2016    . Cranberry 500 MG CAPS Take 1 capsule by mouth daily.     . cyclobenzaprine (FLEXERIL)  10 MG tablet Take 1 tablet (10 mg total) by mouth at bedtime as needed. 90 tablet 0  . estradiol (ESTRACE) 1 MG tablet Take 1 tablet (1 mg total) by mouth daily. 90 tablet 3  . flecainide (TAMBOCOR) 50 MG tablet Take 50 mg by mouth 2 (two) times daily.    . fluticasone (FLONASE) 50 MCG/ACT nasal spray Place 1 spray into both nostrils daily as needed for allergies or rhinitis.    . folic acid (FOLVITE) 1 MG tablet Take 1 mg by mouth daily.    Marland Kitchen gabapentin (NEURONTIN) 100 MG capsule Take 1 capsule (100 mg total) by mouth 3 (three) times daily as needed. 90 capsule 0  . ibuprofen (ADVIL,MOTRIN) 200 MG tablet Take 400 mg by mouth at bedtime. For leg pain/discomfort    . levothyroxine (SYNTHROID, LEVOTHROID) 88 MCG tablet Take 1 tablet (88 mcg total) by mouth daily with breakfast. 90 tablet 3  . Lutein 20 MG CAPS Take 1 capsule by mouth daily.     . methotrexate (RHEUMATREX) 2.5 MG tablet Take 7.5 mg by mouth every 7 (seven) days. [facility])    . metoprolol tartrate (LOPRESSOR) 25 MG tablet Take 1 tablet (25 mg total) by mouth 2 (two) times daily. 60 tablet 1  . montelukast (SINGULAIR) 10 MG tablet Take 1 tablet (10 mg total) by mouth at bedtime. 90 tablet 3  . Multiple Vitamin (MULTIVITAMIN) capsule Take 1 capsule by mouth daily.      . Pumpkin Seed-Soy Germ (AZO BLADDER CONTROL/GO-LESS PO) Take 310 mg by mouth 3 (three) times daily.    . rivaroxaban (XARELTO) 20 MG TABS tablet Take 1 tablet (20 mg total) by mouth daily with supper. 30 tablet 2  . TURMERIC PO Take 1 capsule by mouth daily.    . vitamin B-12 (CYANOCOBALAMIN) 500 MCG tablet Take 500 mcg by mouth daily.     No current facility-administered medications for this visit.    Allergies:   Shellfish allergy; Tetracycline; Amlodipine besylate; Ciprofloxacin; Codeine; Propoxyphene hcl; Sulfamethoxazole-trimethoprim; Tape; Amoxicillin; Other; and Penicillins   Social History:  The patient  reports that she has never smoked. She has never  used smokeless tobacco. She reports that she does not drink alcohol or use illicit drugs.   Family History:  The patient's family history includes Allergies in her mother; Colon cancer in her brother; Heart disease in her mother; Kidney failure in her son; Parkinsonism in her father.    ROS:  Please see the history of present illness.   Otherwise, review of systems is positive for none.   All other systems are reviewed and negative.  PHYSICAL EXAM: VS:  Ht 5\' 5"  (1.651 m)  Wt 199 lb 3.2 oz (90.357 kg)  BMI 33.15 kg/m2  LMP 08/26/1969 , BMI Body mass index is 33.15 kg/(m^2). GEN: Well nourished, well developed, in no acute distress HEENT: normal Neck: no JVD, carotid bruits, or masses Cardiac: RRR; no murmurs, rubs, or gallops,no edema  Respiratory:  clear to auscultation bilaterally, normal work of breathing GI: soft, nontender, nondistended, + BS MS: no deformity or atrophy Skin: warm and dry Neuro:  Strength and sensation are intact Psych: euthymic mood, full affect  EKG:  EKG is not ordered today.  Recent Labs: 11/01/2015: TSH 3.13 03/06/2016: ALT 51; Magnesium 2.1 03/08/2016: BUN 14; Creatinine, Ser 0.89; Hemoglobin 11.8*; Platelets 367; Potassium 4.9; Sodium 136    Lipid Panel     Component Value Date/Time   CHOL 200 11/01/2015 1152   TRIG 107.0 11/01/2015 1152   HDL 72.60 11/01/2015 1152   CHOLHDL 3 11/01/2015 1152   VLDL 21.4 11/01/2015 1152   LDLCALC 106* 11/01/2015 1152   LDLDIRECT 125.7 06/09/2013 0811     Wt Readings from Last 3 Encounters:  03/13/16 199 lb 3.2 oz (90.357 kg)  03/08/16 194 lb 9.6 oz (88.27 kg)  03/06/16 194 lb (87.998 kg)      Other studies Reviewed: Additional studies/ records that were reviewed today include: TEE 03/07/16  Review of the above records today demonstrates:  - Left ventricle: Systolic function was normal. Wall motion was  normal; there were no regional wall motion abnormalities. - Left atrium: No evidence of thrombus in  the atrial cavity or  appendage. No evidence of thrombus in the atrial cavity or  appendage. No evidence of thrombus in the appendage. - Right atrium: No evidence of thrombus in the atrial cavity or  appendage.   ASSESSMENT AND PLAN:  1.  Typical appearing atrial flutter: Had TEE/CV done one week ago.  On Xarelto for anticoagulation.  Discussed options of ablation vs medical management.  Risks of ablation include bleeding, tamponade, heart block, and stroke, among others.  She understands these risks and Has agreed to ablation. We'll schedule her at the soonest time.  This patients CHA2DS2-VASc Score and unadjusted Ischemic Stroke Rate (% per year) is equal to 3.2 % stroke rate/year from a score of 3  Above score calculated as 1 point each if present [CHF, HTN, DM, Vascular=MI/PAD/Aortic Plaque, Age if 65-74, or Female] Above score calculated as 2 points each if present [Age > 75, or Stroke/TIA/TE]  Current medicines are reviewed at length with the patient today.   The patient does not have concerns regarding her medicines.  The following changes were made today:  none  Labs/ tests ordered today include:  No orders of the defined types were placed in this encounter.     Disposition:   FU with Leonela Kivi post ablation  Signed, Mirage Pfefferkorn [name], MD  03/13/2016 10:50 AM     [facility] [address] [place] [address] [phone] (office) [phone] (fax)

## 2016-03-13 NOTE — Patient Instructions (Addendum)
Medication Instructions:  Your physician recommends that you continue on your current medications as directed. Please refer to the Current Medication list given to you today.   Labwork: Your physician recommends that you return for lab work today: BMP/CBC       Testing/Procedures: Your physician has recommended that you have an ablation. Catheter ablation is a medical procedure used to treat some cardiac arrhythmias (irregular heartbeats). During catheter ablation, a long, thin, flexible tube is put into a blood vessel in your groin (upper thigh), or neck. This tube is called an ablation catheter. It is then guided to your heart through the blood vessel. Radio frequency waves destroy small areas of heart tissue where abnormal heartbeats may cause an arrhythmia to start. Please see the instruction sheet given to you today.--03/29/16  Please arrive at [facility] on 03/29/16 at 7:30am Do not eat or drink after midnight the night prior to your procedure Do not take any medications the morning of your procedure Plan for one night stay in the hospital    Follow-Up: Your physician recommends that you schedule a follow-up appointment in: 4 weeks from 03/29/16 with Dr Curt Bears   Thank you for choosing [facility]!!

## 2016-03-15 DIAGNOSIS — I4892 Unspecified atrial flutter: Secondary | ICD-10-CM | POA: Diagnosis not present

## 2016-03-15 DIAGNOSIS — R0602 Shortness of breath: Secondary | ICD-10-CM | POA: Diagnosis not present

## 2016-03-17 ENCOUNTER — Encounter (HOSPITAL_COMMUNITY): Payer: Self-pay

## 2016-03-17 ENCOUNTER — Observation Stay (HOSPITAL_COMMUNITY)
Admission: EM | Admit: 2016-03-17 | Discharge: 2016-03-18 | Disposition: A | Discharge status: Home / Self Care | Payer: Medicare Other | Attending: Cardiology | Admitting: Cardiology

## 2016-03-17 ENCOUNTER — Emergency Department (HOSPITAL_COMMUNITY): Payer: Medicare Other

## 2016-03-17 DIAGNOSIS — E039 Hypothyroidism, unspecified: Secondary | ICD-10-CM | POA: Diagnosis not present

## 2016-03-17 DIAGNOSIS — I5031 Acute diastolic (congestive) heart failure: Secondary | ICD-10-CM | POA: Insufficient documentation

## 2016-03-17 DIAGNOSIS — Z7901 Long term (current) use of anticoagulants: Secondary | ICD-10-CM | POA: Diagnosis not present

## 2016-03-17 DIAGNOSIS — I480 Paroxysmal atrial fibrillation: Secondary | ICD-10-CM | POA: Diagnosis not present

## 2016-03-17 DIAGNOSIS — R0602 Shortness of breath: Secondary | ICD-10-CM | POA: Diagnosis not present

## 2016-03-17 DIAGNOSIS — J810 Acute pulmonary edema: Secondary | ICD-10-CM | POA: Insufficient documentation

## 2016-03-17 DIAGNOSIS — Z8679 Personal history of other diseases of the circulatory system: Secondary | ICD-10-CM | POA: Diagnosis present

## 2016-03-17 DIAGNOSIS — Z85820 Personal history of malignant melanoma of skin: Secondary | ICD-10-CM | POA: Insufficient documentation

## 2016-03-17 DIAGNOSIS — I4891 Unspecified atrial fibrillation: Principal | ICD-10-CM | POA: Insufficient documentation

## 2016-03-17 DIAGNOSIS — R7989 Other specified abnormal findings of blood chemistry: Secondary | ICD-10-CM

## 2016-03-17 DIAGNOSIS — I110 Hypertensive heart disease with heart failure: Secondary | ICD-10-CM | POA: Insufficient documentation

## 2016-03-17 LAB — COMPREHENSIVE METABOLIC PANEL
ALT: 29 U/L (ref 14–54)
AST: 24 U/L (ref 15–41)
Albumin: 3 g/dL — ABNORMAL LOW (ref 3.5–5.0)
Alkaline Phosphatase: 80 U/L (ref 38–126)
Anion gap: 8 (ref 5–15)
BUN: 16 mg/dL (ref 6–20)
CO2: 24 mmol/L (ref 22–32)
Calcium: 9.2 mg/dL (ref 8.9–10.3)
Chloride: 103 mmol/L (ref 101–111)
Creatinine, Ser: 1.16 mg/dL — ABNORMAL HIGH (ref 0.44–1.00)
GFR calc Af Amer: 50 mL/min — ABNORMAL LOW (ref >60)
GFR calc non Af Amer: 43 mL/min — ABNORMAL LOW (ref >60)
Glucose, Bld: 128 mg/dL — ABNORMAL HIGH (ref 65–99)
Potassium: 4.5 mmol/L (ref 3.5–5.1)
Sodium: 135 mmol/L (ref 135–145)
Total Bilirubin: 1.2 mg/dL (ref 0.3–1.2)
Total Protein: 6.1 g/dL — ABNORMAL LOW (ref 6.5–8.1)

## 2016-03-17 LAB — CBC WITH DIFFERENTIAL/PLATELET
Basophils Absolute: 0 10*3/uL (ref 0.0–0.1)
Basophils Relative: 0 %
Eosinophils Absolute: 0 10*3/uL (ref 0.0–0.7)
Eosinophils Relative: 0 %
HCT: 45.8 % (ref 36.0–46.0)
Hemoglobin: 15 g/dL (ref 12.0–15.0)
Lymphocytes Relative: 12 %
Lymphs Abs: 2 10*3/uL (ref 0.7–4.0)
MCH: 32.3 pg (ref 26.0–34.0)
MCHC: 32.8 g/dL (ref 30.0–36.0)
MCV: 98.7 fL (ref 78.0–100.0)
Monocytes Absolute: 1.6 10*3/uL — ABNORMAL HIGH (ref 0.1–1.0)
Monocytes Relative: 10 %
Neutro Abs: 13.4 10*3/uL — ABNORMAL HIGH (ref 1.7–7.7)
Neutrophils Relative %: 78 %
Platelets: 491 10*3/uL — ABNORMAL HIGH (ref 150–400)
RBC: 4.64 MIL/uL (ref 3.87–5.11)
RDW: 13.6 % (ref 11.5–15.5)
WBC: 17.1 10*3/uL — ABNORMAL HIGH (ref 4.0–10.5)

## 2016-03-17 LAB — I-STAT CG4 LACTIC ACID, ED
Lactic Acid, Venous: 1.36 mmol/L (ref 0.5–1.9)
Lactic Acid, Venous: 1.05 mmol/L (ref 0.5–1.9)

## 2016-03-17 LAB — I-STAT CHEM 8, ED
BUN: 17 mg/dL (ref 6–20)
Calcium, Ion: 1.07 mmol/L — ABNORMAL LOW (ref 1.12–1.23)
Chloride: 103 mmol/L (ref 101–111)
Creatinine, Ser: 1 mg/dL (ref 0.44–1.00)
Glucose, Bld: 129 mg/dL — ABNORMAL HIGH (ref 65–99)
HCT: 46 % (ref 36.0–46.0)
Hemoglobin: 15.6 g/dL — ABNORMAL HIGH (ref 12.0–15.0)
Potassium: 4.4 mmol/L (ref 3.5–5.1)
Sodium: 137 mmol/L (ref 135–145)
TCO2: 23 mmol/L (ref 0–100)

## 2016-03-17 LAB — I-STAT TROPONIN, ED: Troponin i, poc: 0.01 ng/mL (ref 0.00–0.08)

## 2016-03-17 LAB — PROCALCITONIN: Procalcitonin: <0.1 ng/mL

## 2016-03-17 LAB — BRAIN NATRIURETIC PEPTIDE: B Natriuretic Peptide: 631.6 pg/mL — ABNORMAL HIGH (ref 0.0–100.0)

## 2016-03-17 LAB — MAGNESIUM: Magnesium: 2 mg/dL (ref 1.7–2.4)

## 2016-03-17 MED ORDER — ALBUTEROL SULFATE (2.5 MG/3ML) 0.083% IN NEBU: 2.5000 mg | INHALATION_SOLUTION | Freq: Once | RESPIRATORY_TRACT | Status: DC

## 2016-03-17 MED ORDER — ASPIRIN 81 MG PO CHEW
324.0000 mg | CHEWABLE_TABLET | Freq: Once | ORAL | Status: AC
  Administered 2016-03-17: 324 mg via ORAL
  Filled 2016-03-17: qty 4

## 2016-03-17 MED ORDER — SODIUM CHLORIDE 0.9 % IV SOLN
1.0000 g | Freq: Once | INTRAVENOUS | Status: AC
  Administered 2016-03-17: 1 g via INTRAVENOUS
  Filled 2016-03-17: qty 10

## 2016-03-17 MED ORDER — ETOMIDATE 2 MG/ML IV SOLN
0.1000 mg/kg | Freq: Once | INTRAVENOUS | Status: AC
  Administered 2016-03-17: 20 mg via INTRAVENOUS
  Filled 2016-03-17: qty 10

## 2016-03-17 MED ORDER — DILTIAZEM HCL 25 MG/5ML IV SOLN
10.0000 mg | Freq: Once | INTRAVENOUS | Status: AC
  Administered 2016-03-17: 10 mg via INTRAVENOUS
  Filled 2016-03-17: qty 5

## 2016-03-17 MED ORDER — SODIUM CHLORIDE 0.9% FLUSH
3.0000 mL | Freq: Two times a day (BID) | INTRAVENOUS | Status: DC
  Administered 2016-03-17 – 2016-03-18 (×2): 3 mL via INTRAVENOUS

## 2016-03-17 MED ORDER — FUROSEMIDE 10 MG/ML IJ SOLN
40.0000 mg | Freq: Once | INTRAMUSCULAR | Status: DC
  Filled 2016-03-17: qty 4

## 2016-03-17 MED ORDER — LEVOTHYROXINE SODIUM 88 MCG PO TABS
88.0000 ug | ORAL_TABLET | Freq: Every day | ORAL | Status: DC
  Administered 2016-03-18: 88 ug via ORAL
  Filled 2016-03-17: qty 1

## 2016-03-17 MED ORDER — DEXTROSE 5 % IV SOLN
5.0000 mg/h | INTRAVENOUS | Status: DC
  Administered 2016-03-17: 15 mg/h via INTRAVENOUS
  Filled 2016-03-17: qty 100

## 2016-03-17 MED ORDER — ESTRADIOL 1 MG PO TABS
1.0000 mg | ORAL_TABLET | Freq: Every day | ORAL | Status: DC
  Administered 2016-03-18: 1 mg via ORAL
  Filled 2016-03-17: qty 1

## 2016-03-17 MED ORDER — DILTIAZEM LOAD VIA INFUSION
10.0000 mg | Freq: Once | INTRAVENOUS | Status: AC
  Administered 2016-03-17: 10 mg via INTRAVENOUS
  Filled 2016-03-17: qty 10

## 2016-03-17 MED ORDER — FLECAINIDE ACETATE 100 MG PO TABS
100.0000 mg | ORAL_TABLET | Freq: Two times a day (BID) | ORAL | Status: DC
  Administered 2016-03-17 – 2016-03-18 (×2): 100 mg via ORAL
  Filled 2016-03-17 (×3): qty 1

## 2016-03-17 MED ORDER — FUROSEMIDE 10 MG/ML IJ SOLN
20.0000 mg | Freq: Once | INTRAMUSCULAR | Status: AC
  Administered 2016-03-17: 20 mg via INTRAVENOUS

## 2016-03-17 MED ORDER — RIVAROXABAN 20 MG PO TABS
20.0000 mg | ORAL_TABLET | Freq: Every day | ORAL | Status: DC
  Filled 2016-03-17: qty 1

## 2016-03-17 MED ORDER — METOPROLOL TARTRATE 25 MG PO TABS
25.0000 mg | ORAL_TABLET | Freq: Two times a day (BID) | ORAL | Status: DC
Start: 2016-03-17 — End: 2016-03-18
  Administered 2016-03-17: 12.5 mg via ORAL
  Administered 2016-03-18: 25 mg via ORAL
  Filled 2016-03-17 (×2): qty 1

## 2016-03-17 NOTE — ED Provider Notes (Addendum)
[facility] DEPT Provider Note   CSN: [id number] Arrival date & time: 03/17/16  1037  First Provider Contact:  First MD Initiated Contact with Patient 03/17/16 1117        History   Chief Complaint No chief complaint on file.   HPI Linda Mcgrath is a 82 y.o. female.  82 year old female with past medical history of diastolic heart failure and atrial flutter, status post recent cardioversion, who presents with persistent shortness of breath and tachycardia. The patient states she felt well following discharge from recent hospitalization. She was cardioverted at that time and felt significantly better afterwards. However, over the last 24 hours She has had progressively worsening shortness of breath. She has had a associated mild, dull, substernal chest pressure. Over the last several hours. She noticed that she began to feel progressively more short of breath with worsening chest pressure. The pressure seems to worsen with exertion. Denies any alleviating factors. This felt similar to her previous atrial fibrillation, so she decided to present for evaluation. Of note, she states she has been fully adherent with her outpatient Xarelto or other medications. She has not missed any Xarelto doses.      Past Medical History:  Diagnosis Date  . Allergic rhinitis   . Cataract    Bil/lens implant  . Colon polyp 1999   small polyp  . Diverticulosis   . Fibromyalgia   . Hyperlipidemia   . Hypertension   . Hypothyroidism   . Internal hemorrhoids   . Leg cramps   . Lung nodule    stable LUL 9 mm  . Menopausal syndrome   . Osteoarthritis   . UTI (urinary tract infection)     Patient Active Problem List   Diagnosis Date Noted  . Tachycardia 03/06/2016  . Atrial flutter ([place]) 03/06/2016  . Severe headache 03/04/2016  . Acute non-recurrent pansinusitis 12/20/2015  . Acute bronchitis 12/11/2015  . Obesity 11/06/2015  . Urge incontinence 01/24/2015  . Estrogen  deficiency 10/25/2014  . Lichen planus [id number]  . Encounter for Medicare annual wellness exam 06/17/2013  . Aphthous ulcer of mouth 03/16/2013  . Hyperglycemia 06/03/2011  . HYPERTENSION, BENIGN ESSENTIAL 10/23/2010  . CONSTIPATION 10/23/2010  . HEMATOCHEZIA 10/23/2010  . OSTEOARTHRITIS, HANDS, BILATERAL 01/24/2010  . Hypothyroidism 08/24/2008  . Vitamin D deficiency 06/06/2008  . Hyperlipidemia 06/06/2008  . MENOPAUSAL SYNDROME 03/29/2008  . Osteopenia 03/29/2008  . ALLERGIC RHINITIS 01/25/2008  . IBS 01/25/2008  . OSTEOARTHRITIS 01/25/2008  . FIBROMYALGIA 01/25/2008    Past Surgical History:  Procedure Laterality Date  . Abd U/S  11/1998   negative  . Abd U/S  01/2001   gallbladder polyps  . ABDOMINAL HYSTERECTOMY  1975   total-fibroids  . APPENDECTOMY  1975  . BREAST BIOPSY     benign  . CARDIOVERSION N/A 03/07/2016   Procedure: CARDIOVERSION;  Surgeon: Adrian Prows, MD;  Location: [facility];  Service: Cardiovascular;  Laterality: N/A;  . CATARACT EXTRACTION     bilateral  . CHOLECYSTECTOMY    . COLONOSCOPY  1998   Diverticulosis; polyp\  . COLONOSCOPY  09/2002   Diverticulosis, hem  . COLONOSCOPY  12/2007   diverticulosis, polyp  . DEXA  10/2001   osteopenia  . ESOPHAGOGASTRODUODENOSCOPY  2004  . [place] scan  01/2001   Negative  . KNEE SURGERY     right knee / 11/2004  . laser surgery for glaucoma Left Sep 21, 2014  . ROTATOR CUFF REPAIR     x 2 /right  shoulder  . SKIN CANCER EXCISION     pre-melanoma / on face  . TEE WITHOUT CARDIOVERSION N/A 03/07/2016   Procedure: TRANSESOPHAGEAL ECHOCARDIOGRAM (TEE);  Surgeon: Adrian Prows, MD;  Location: [facility];  Service: Cardiovascular;  Laterality: N/A;    OB History    No data available       Home Medications    Prior to Admission medications   Medication Sig Start Date End Date Taking? Authorizing Provider  albuterol (PROVENTIL HFA;VENTOLIN HFA) 108 (90 BASE) MCG/ACT inhaler Inhale 2 puffs into the lungs  every 4 (four) hours as needed for wheezing. 05/16/14  Yes Abner Greenspan, MD  Ascorbic Acid (VITAMIN C) 1000 MG tablet Take 1,000 mg by mouth daily.     Yes Historical Provider, MD  B Complex Vitamins (VITAMIN B COMPLEX PO) Take 1 tablet by mouth daily.   Yes Historical Provider, MD  Biotin 10 MG TABS Take 1 tablet by mouth every morning.    Yes Historical Provider, MD  Calcium Carbonate-Vit D-Min 600-400 MG-UNIT TABS Take 1 tablet by mouth every morning.    Yes Historical Provider, MD  chlorhexidine (PERIDEX) 0.12 % solution Use as directed 5-15 mLs in the mouth or throat daily as needed (for breakouts). Reported on 03/06/2016 04/28/13  Yes Historical Provider, MD  Cranberry 500 MG CAPS Take 1 capsule by mouth daily.    Yes Historical Provider, MD  cyclobenzaprine (FLEXERIL) 10 MG tablet Take 1 tablet (10 mg total) by mouth at bedtime as needed. 12/20/15  Yes Venia Carbon, MD  estradiol (ESTRACE) 1 MG tablet Take 1 tablet (1 mg total) by mouth daily. 11/06/15  Yes Abner Greenspan, MD  flecainide (TAMBOCOR) 100 MG tablet Take 100 mg by mouth 2 (two) times daily.    Yes Historical Provider, MD  fluticasone (FLONASE) 50 MCG/ACT nasal spray Place 1 spray into both nostrils daily as needed for allergies or rhinitis.   Yes Historical Provider, MD  folic acid (FOLVITE) 1 MG tablet Take 1 mg by mouth daily.   Yes Historical Provider, MD  gabapentin (NEURONTIN) 100 MG capsule Take 1 capsule (100 mg total) by mouth 3 (three) times daily as needed. 03/04/16  Yes Tonia Ghent, MD  ibuprofen (ADVIL,MOTRIN) 200 MG tablet Take 400 mg by mouth at bedtime. For leg pain/discomfort   Yes Historical Provider, MD  levothyroxine (SYNTHROID, LEVOTHROID) 88 MCG tablet Take 1 tablet (88 mcg total) by mouth daily with breakfast. 11/06/15  Yes Abner Greenspan, MD  Lutein 20 MG CAPS Take 1 capsule by mouth daily.    Yes Historical Provider, MD  methotrexate (RHEUMATREX) 2.5 MG tablet Take 7.5 mg by mouth every 7 (seven) days.  [facility]) 01/08/16  Yes Historical Provider, MD  metoprolol tartrate (LOPRESSOR) 25 MG tablet Take 1 tablet (25 mg total) by mouth 2 (two) times daily. 03/08/16  Yes Adrian Prows, MD  montelukast (SINGULAIR) 10 MG tablet Take 1 tablet (10 mg total) by mouth at bedtime. 12/20/15  Yes Venia Carbon, MD  Multiple Vitamin (MULTIVITAMIN) capsule Take 1 capsule by mouth daily.     Yes Historical Provider, MD  Pumpkin Seed-Soy Germ (AZO BLADDER CONTROL/GO-LESS PO) Take 310 mg by mouth 3 (three) times daily.   Yes Historical Provider, MD  rivaroxaban (XARELTO) 20 MG TABS tablet Take 1 tablet (20 mg total) by mouth daily with supper. 03/08/16  Yes Adrian Prows, MD  TURMERIC PO Take 1 capsule by mouth daily.   Yes Historical Provider, MD  vitamin B-12 (CYANOCOBALAMIN) 500 MCG tablet Take 500 mcg by mouth daily.   Yes Historical Provider, MD    Family History Family History  Problem Relation Age of Onset  . Parkinsonism Father   . Colon cancer Brother   . Allergies Mother   . Heart disease Mother   . Kidney failure Son     congenital    Social History Social History  Substance Use Topics  . Smoking status: Never Smoker  . Smokeless tobacco: Never Used  . Alcohol use No     Comment: occasional-rare     Allergies   Shellfish allergy; Tetracycline; Amlodipine besylate; Ciprofloxacin; Codeine; Propoxyphene hcl; Sulfamethoxazole-trimethoprim; Tape; Amoxicillin; Other; and Penicillins   Review of Systems Review of Systems  Constitutional: Positive for fatigue. Negative for chills and fever.  HENT: Negative for congestion and rhinorrhea.   Eyes: Negative for visual disturbance.  Respiratory: Positive for shortness of breath.   Cardiovascular: Positive for chest pain and palpitations. Negative for leg swelling.  Gastrointestinal: Negative for abdominal pain, diarrhea, nausea and vomiting.  Musculoskeletal: Negative for neck pain.  Skin: Negative for rash.  Allergic/Immunologic: Negative for  immunocompromised state.  Neurological: Positive for weakness (Generalized). Negative for syncope and headaches.     Physical Exam Updated Vital Signs BP 137/62 (BP Location: Right Arm)   Pulse 80   Temp 97.8 F (36.6 C) (Oral)   Resp (!) 24   Ht 5\' 5"  (1.651 m)   Wt 172 lb 14.4 oz (78.4 kg)   LMP 08/26/1969   SpO2 96%   BMI 28.77 kg/m   Physical Exam  Constitutional: She appears well-developed and well-nourished. No distress.  HENT:  Head: Normocephalic.  Mouth/Throat: Oropharynx is clear and moist. No oropharyngeal exudate.  Eyes: Conjunctivae are normal. Pupils are equal, round, and reactive to light.  Neck: Neck supple.  Cardiovascular: Normal heart sounds and intact distal pulses.  An irregularly irregular rhythm present. Tachycardia present.  Exam reveals no friction rub.   No murmur heard. Pulmonary/Chest: Tachypnea noted. She has decreased breath sounds. She has rales in the right lower field and the left lower field.  Abdominal: Soft. She exhibits no distension. There is no tenderness.  Musculoskeletal: She exhibits no edema.  Neurological: She is alert. She exhibits normal muscle tone.  Skin: Skin is warm. Capillary refill takes less than 2 seconds.  Nursing note and vitals reviewed.    ED Treatments / Results  Labs (all labs ordered are listed, but only abnormal results are displayed) Labs Reviewed  CBC WITH DIFFERENTIAL/PLATELET - Abnormal; Notable for the following:       Result Value   WBC 17.1 (*)    Platelets 491 (*)    Neutro Abs 13.4 (*)    Monocytes Absolute 1.6 (*)    All other components within normal limits  COMPREHENSIVE METABOLIC PANEL - Abnormal; Notable for the following:    Glucose, Bld 128 (*)    Creatinine, Ser 1.16 (*)    Total Protein 6.1 (*)    Albumin 3.0 (*)    GFR calc non Af Amer 43 (*)    GFR calc Af Amer 50 (*)    All other components within normal limits  BRAIN NATRIURETIC PEPTIDE - Abnormal; Notable for the following:     B Natriuretic Peptide 631.6 (*)    All other components within normal limits  I-STAT CHEM 8, ED - Abnormal; Notable for the following:    Glucose, Bld 129 (*)    Calcium, Ion  1.07 (*)    Hemoglobin 15.6 (*)    All other components within normal limits  MAGNESIUM  PROCALCITONIN  I-STAT TROPOININ, ED  I-STAT CG4 LACTIC ACID, ED  I-STAT CG4 LACTIC ACID, ED    EKG  EKG Interpretation None       Radiology Dg Chest Portable 1 View  Result Date: 03/17/2016 CLINICAL DATA:  82 year old female with cardiac palpitations and shortness of breath. EXAM: PORTABLE CHEST 1 VIEW COMPARISON:  Prior chest x-ray and CT scan of the chest 03/06/2016 FINDINGS: Similar opacification of the left hemi thorax. The left-sided pleural effusion is slightly larger than previously noted. Associated left lower lobe atelectasis. Chronic bronchitic changes are similar compared to prior. Left upper lobe pulmonary nodule as seen on prior CT. No overt pulmonary edema. Small right effusion suspected. No acute osseous abnormality. IMPRESSION: Slightly enlarged left pleural effusion with associated left lower lobe atelectasis. Suspect trace right pleural effusion as well. Left upper lobe pulmonary nodule as seen on recent prior imaging. Electronically Signed   By: Jacqulynn Cadet M.D.   On: 03/17/2016 11:49   Procedures .Sedation Date/Time: 03/17/2016 6:48 PM Performed by: Duffy Bruce Authorized by: Duffy Bruce   Consent:    Consent obtained:  Written   Consent given by:  Patient   Risks discussed:  Allergic reaction, inadequate sedation, nausea, dysrhythmia, prolonged hypoxia resulting in organ damage, prolonged sedation necessitating reversal, respiratory compromise necessitating ventilatory assistance and intubation and vomiting   Alternatives discussed:  Analgesia without sedation and anxiolysis Universal protocol:    Procedure explained and questions answered to patient or proxy's satisfaction: yes      Relevant documents present and verified: yes     Test results available and properly labeled: yes     Imaging studies available: yes     Required blood products, implants, devices, and special equipment available: yes     Site/side marked: yes     Immediately prior to procedure a time out was called: yes     Patient identity confirmation method:  Arm band and verbally with patient Indications:    Procedure performed:  Cardioversion   Procedure necessitating sedation performed by:  Different physician   Intended level of sedation:  Moderate (conscious sedation) Pre-sedation assessment:    Time since last food or drink:  >8 hours   NPO status caution: urgency dictates proceeding with non-ideal NPO status     ASA classification: class 3 - patient with severe systemic disease     Neck mobility: normal     Mouth opening:  3 or more finger widths   Thyromental distance:  3 finger widths   Mallampati score:  II - soft palate, uvula, fauces visible   Pre-sedation assessments completed and reviewed: airway patency, cardiovascular function, hydration status, mental status, nausea/vomiting, respiratory function and temperature   Immediate pre-procedure details:    Reassessment: Patient reassessed immediately prior to procedure     Reviewed: vital signs, relevant labs/tests and NPO status     Verified: bag valve mask available, emergency equipment available, intubation equipment available, IV patency confirmed, oxygen available and suction available   Procedure details (see MAR for exact dosages):    Preoxygenation:  Room air   Sedation:  Etomidate   Intra-procedure monitoring:  Blood pressure monitoring, cardiac monitor, continuous capnometry, continuous pulse oximetry, frequent LOC assessments and frequent vital sign checks   Intra-procedure events: none     Intra-procedure management:  Airway repositioning   Total sedation time (minutes):  45 Post-procedure  details:    Post-sedation assessment  completed:  03/17/2016 6:52 PM   Attendance: Constant attendance by certified staff until patient recovered     Recovery: Patient returned to pre-procedure baseline     Estimated blood loss (see I/O flowsheets): yes     Post-sedation assessments completed and reviewed: airway patency, cardiovascular function, hydration status, mental status, nausea/vomiting, pain level, respiratory function and temperature     Patient is stable for discharge or admission: yes     Patient tolerance:  Tolerated well, no immediate complications   .Critical Care Performed by: Duffy Bruce Authorized by: Duffy Bruce   Critical care provider statement:    Critical care time (minutes):  35   Critical care time was exclusive of:  Separately billable procedures and treating other patients   Critical care was necessary to treat or prevent imminent or life-threatening deterioration of the following conditions:  Cardiac failure and circulatory failure   Critical care was time spent personally by me on the following activities:  Ordering and performing treatments and interventions, development of treatment plan with patient or surrogate, ordering and review of laboratory studies, discussions with consultants, ordering and review of radiographic studies, pulse oximetry, re-evaluation of patient's condition, evaluation of patient's response to treatment, examination of patient and review of old charts   I assumed direction of critical care for this patient from another provider in my specialty: no     (including critical care time)  Medications Ordered in ED Medications  albuterol (PROVENTIL) (2.5 MG/3ML) 0.083% nebulizer solution 2.5 mg (not administered)  sodium chloride flush (NS) 0.9 % injection 3 mL (3 mLs Intravenous Given 03/17/16 2124)  flecainide (TAMBOCOR) tablet 100 mg (not administered)  estradiol (ESTRACE) tablet 1 mg (not administered)  levothyroxine (SYNTHROID, LEVOTHROID) tablet 88 mcg (not  administered)  metoprolol tartrate (LOPRESSOR) tablet 25 mg (12.5 mg Oral Given 03/17/16 2122)  rivaroxaban (XARELTO) tablet 20 mg (not administered)  calcium gluconate 1 g in sodium chloride 0.9 % 100 mL IVPB (0 g Intravenous Stopped 03/17/16 1329)  aspirin chewable tablet 324 mg (324 mg Oral Given 03/17/16 1206)  diltiazem (CARDIZEM) injection 10 mg (10 mg Intravenous Given 03/17/16 1207)  diltiazem (CARDIZEM) 1 mg/mL load via infusion 10 mg (10 mg Intravenous Bolus from Bag 03/17/16 1328)  etomidate (AMIDATE) injection 8.9 mg (20 mg Intravenous Given 03/17/16 1710)  furosemide (LASIX) injection 20 mg (20 mg Intravenous Given 03/17/16 1821)     Angiocath insertion Performed by: Evonnie Pat  Consent: Verbal consent obtained. Risks and benefits: risks, benefits and alternatives were discussed Time out: Immediately prior to procedure a "time out" was called to verify the correct patient, procedure, equipment, support staff and site/side marked as required.  Preparation: Patient was prepped and draped in the usual sterile fashion.  Vein Location: Right forearm  Ultrasound Guided  Gauge: 18  Normal blood return and flush without difficulty Patient tolerance: Patient tolerated the procedure well with no immediate complications.   Initial Impression / Assessment and Plan / ED Course  I have reviewed the triage vital signs and the nursing notes.  Pertinent labs & imaging results that were available during my care of the patient were reviewed by me and considered in my medical decision making (see chart for details).  Clinical Course    82 year old female with past medical history of atrial flutter, diastolic CHF, COPD, who presents with recurrent atrial fibrillation. See history of present illness above. On arrival, patient is is in atrial fibrillation versus flutter with  variable conduction. She does have some signs of fluid overload as well as increased work of breathing, although  patient also was wheezing with possible COPD component. I immediately called and discussed with patient's cardiologist, Dr. Einar Gip. He recommends potential cardioversion if medically able.. At this time, will attempt rate control while obtaining adequate IV access, and sending broad medical workup. Patient is hemodynamically stable currently, with no signs of instability to necessitate emergent cardioversion. Cardiology is aware.  Labs, imaging reviewed as above. Chest x-ray shows hypervolemia and chronic but worsened, left greater than right sided effusions. BNP is elevated consistent with CHF exacerbation. Otherwise, troponin is negative. Electrolytes are acceptable. I discussed the findings with Dr. Einar Gip. Heart rate remains elevated despite diltiazem bolus and drip. Based on a shared decision-making discussion with myself, the patient, and Dr. Einar Gip, given the patient's signs of CHF suggestive of intolerance of atrial fibrillation, as well as history of poor response to medications, per Dr. Einar Gip the benefits of sedation outweigh the risks. Pt needs emergent cardioversion per Dr. Einar Gip. Patient subsequently consented. Risks, benefits discussed in detail. She was sedated with etomidate as above. Tolerated the procedure well. Cardioversion performed by cardiology with return to sinus rhythm.  Patient is now awake, alert, with improved work of breathing affter cardioversion. Will admit to cardiology for IV Lasix and monitoring. Patient is in agreement with this plan. She is tolerating by mouth and is stable for admission from a sedation standpoint  Final Clinical Impressions(s) / ED Diagnoses   Final diagnoses:  Atrial fibrillation with rapid ventricular response (HCC)  Acute pulmonary edema (HCC)  Acute diastolic congestive heart failure (HCC)  Elevated brain natriuretic peptide (BNP) level    New Prescriptions Current Discharge Medication List       Duffy Bruce, MD 03/17/16 2128      Duffy Bruce, MD 03/17/16 2158

## 2016-03-17 NOTE — ED Triage Notes (Signed)
Patient here with palpitations and shortness of breath that awoke her this am, was cardioverted 1 week ago and had stress test last Friday. Complains of chest tightness with same.

## 2016-03-17 NOTE — Progress Notes (Signed)
ANTICOAGULATION CONSULT NOTE - Initial Consult  Pharmacy Consult for Xarelto Indication: atrial fibrillation  Allergies  Allergen Reactions  . Shellfish Allergy Anaphylaxis and Nausea And Vomiting  . Tetracycline Hives, Nausea And Vomiting, Rash and Other (See Comments)    SYSTEMIC REACTION: heart palpitations,muscle spasms, vomiting  . Amlodipine Besylate     REACTION: muscle spasms, extremity swelling, low bp  . Ciprofloxacin     REACTION: muscle spasms, insomnia  . Codeine     REACTION: hallucinations  . Propoxyphene Hcl   . Sulfamethoxazole-Trimethoprim Nausea Only and Other (See Comments)    REACTION: dizzy, could not focus eyes and could not concentrate and nausea  . Tape Other (See Comments)    BANDAIDS TAKE OFF HER SKIN; PLEASE USE COBAN OR PAPER   . Amoxicillin Rash  . 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 Penicillins Swelling and Rash    Has patient had a PCN reaction causing immediate rash, facial/tongue/throat swelling, SOB or lightheadedness with hypotension: Yes Has patient had a PCN reaction causing severe rash involving mucus membranes or skin necrosis: No Has patient had a PCN reaction that required hospitalization No Has patient had a PCN reaction occurring within the last 10 years: No If all of the above answers are "NO", then may proceed with Cephalosporin use.     Patient Measurements: Height: 5\' 5"  (165.1 cm) Weight: 196 lb (88.9 kg) IBW/kg (Calculated) : 57  Vital Signs: Temp: 98.3 F (36.8 C) (07/23 1049) Temp Source: Oral (07/23 1049) BP: 116/70 (07/23 1734) Pulse Rate: 71 (07/23 1734)  Labs:  Recent Labs  03/17/16 1109 03/17/16 1138  HGB 15.0 15.6*  HCT 45.8 46.0  PLT 491*  --   CREATININE 1.16* 1.00    Estimated Creatinine Clearance: 49.4 mL/min (by C-G formula  based on SCr of 1 mg/dL).  Assessment: 36 YOF with history of AFib and is now s/p direct cardioversion. On Xarelto 20mg  PTA with last dose taken yesterday with supper.  Hgb 15, plts 491- no bleeding noted. SCr 1 with CrCl using actual body weight ~60-43mL/min.  Goal of Therapy:    Monitor platelets by anticoagulation protocol: Yes   Plan:  -Xarelto 20mg  qsupper can be continued- appropriate for her renal function -watch for s/s bleeding -cbc q72h while in the hospital  Alynah Schone D. Lakie Mclouth, PharmD, BCPS Clinical Pharmacist Pager: [phone] 03/17/2016 5:50 PM

## 2016-03-17 NOTE — CV Procedure (Signed)
Direct current cardioversion:  Indication symptomatic A. Flutter  Procedure: Using 10 mg of IV Etomidatefor achieving deep sedation, synchronized direct current cardioversion performed. Patient was delivered with 50 Joules of electricity X 1 with success to NSR. Patient tolerated the procedure well. No immediate complication noted.

## 2016-03-17 NOTE — ED Notes (Signed)
Attempted to call report

## 2016-03-17 NOTE — H&P (Signed)
Linda Mcgrath is an 82 y.o. female.   Chief Complaint: Atrial flutter HPI: Linda Mcgrath  is a 82 y.o. female with a recent history of new onset atrial flutter but with no other significant past medical history.  On 03/01/2016, she developed a severe headache, SOB, and sharp, "electric like" in her chest pain back. She called her PCP on 03/04/2016 and had neck xray and CT scan. Chart note documents heart rate of 117 at that time. She returned on 03/06/2016 for follow up and heart rate was in the 150s and she was sent to the ED for further evaluation. On 03/07/2016, she underwent TEE guided cardioversion with successful conversion to sinus rhythm. She was started on Flecainide '100mg'$  bid for maintenance of sinus rhythm and scheduled for outpatient follow up with Dr. Allegra Lai for consideration for ablation.  She called the office on 03/12/2016 requesting to be seen due to malaise and signficant fatigue. Also report dyspnea on exertion which has been ongoing for 1-2 weeks. She was still in sinus rhythm and flecainide was reduced to try to minimize symptoms.  She returned to the ED again today with palpitations and shortness of breath, again noted to be in atrial flutter.   Past Medical History:  Diagnosis Date  . Allergic rhinitis   . Cataract    Bil/lens implant  . Colon polyp 1999   small polyp  . Diverticulosis   . Fibromyalgia   . Hyperlipidemia   . Hypertension   . Hypothyroidism   . Internal hemorrhoids   . Leg cramps   . Lung nodule    stable LUL 9 mm  . Menopausal syndrome   . Osteoarthritis   . UTI (urinary tract infection)     Past Surgical History:  Procedure Laterality Date  . Abd U/S  11/1998   negative  . Abd U/S  01/2001   gallbladder polyps  . ABDOMINAL HYSTERECTOMY  1975   total-fibroids  . APPENDECTOMY  1975  . BREAST BIOPSY     benign  . CARDIOVERSION N/A 03/07/2016   Procedure: CARDIOVERSION;  Surgeon: Adrian Prows, MD;  Location: [facility];   Service: Cardiovascular;  Laterality: N/A;  . CATARACT EXTRACTION     bilateral  . CHOLECYSTECTOMY    . COLONOSCOPY  1998   Diverticulosis; polyp\  . COLONOSCOPY  09/2002   Diverticulosis, hem  . COLONOSCOPY  12/2007   diverticulosis, polyp  . DEXA  10/2001   osteopenia  . ESOPHAGOGASTRODUODENOSCOPY  2004  . [place] scan  01/2001   Negative  . KNEE SURGERY     right knee / 11/2004  . laser surgery for glaucoma Left Sep 21, 2014  . ROTATOR CUFF REPAIR     x 2 /right shoulder  . SKIN CANCER EXCISION     pre-melanoma / on face  . TEE WITHOUT CARDIOVERSION N/A 03/07/2016   Procedure: TRANSESOPHAGEAL ECHOCARDIOGRAM (TEE);  Surgeon: Adrian Prows, MD;  Location: [facility] END[facility];  Service: Cardiovascular;  Laterality: N/A;    Family History  Problem Relation Age of Onset  . Parkinsonism Father   . Colon cancer Brother   . Allergies Mother   . Heart disease Mother   . Kidney failure Son     congenital   Social History:  reports that she has never smoked. She has never used smokeless tobacco. She reports that she does not drink alcohol or use drugs.  Allergies:  Allergies  Allergen Reactions  . Shellfish Allergy Anaphylaxis and Nausea And Vomiting  .  Tetracycline Hives, Nausea And Vomiting, Rash and Other (See Comments)    SYSTEMIC REACTION: heart palpitations,muscle spasms, vomiting  . Amlodipine Besylate     REACTION: muscle spasms, extremity swelling, low bp  . Ciprofloxacin     REACTION: muscle spasms, insomnia  . Codeine     REACTION: hallucinations  . Propoxyphene Hcl   . Sulfamethoxazole-Trimethoprim Nausea Only and Other (See Comments)    REACTION: dizzy, could not focus eyes and could not concentrate and nausea  . Tape Other (See Comments)    BANDAIDS TAKE OFF HER SKIN; PLEASE USE COBAN OR PAPER   . Amoxicillin Rash  . Other Swelling, Rash and Other (See Comments)    Has autoimmune disorder and spices erode her salivary glands and affects ankles and mouth DIRECTLY (takes off  1st layer of skin and leaves her unable to walk)  . Penicillins Swelling and Rash    Has patient had a PCN reaction causing immediate rash, facial/tongue/throat swelling, SOB or lightheadedness with hypotension: Yes Has patient had a PCN reaction causing severe rash involving mucus membranes or skin necrosis: No Has patient had a PCN reaction that required hospitalization No Has patient had a PCN reaction occurring within the last 10 years: No If all of the above answers are "NO", then may proceed with Cephalosporin use.     Review of Systems - History obtained from the patient General ROS: positive for  - fatigue negative for - fever Respiratory ROS: positive for - shortness of breath negative for - cough or hemoptysis Cardiovascular ROS: positive for - chest pain and palpitations negative for - edema, loss of consciousness, orthopnea or paroxysmal nocturnal dyspnea Neurological ROS: no TIA or stroke symptoms  Blood pressure 125/85, pulse 120, temperature 98.3 F (36.8 C), temperature source Oral, resp. rate 20, height '5\' 5"'$  (1.651 m), weight 88.9 kg (196 lb), last menstrual period 08/26/1969, SpO2 93 %. General appearance: alert, cooperative and no distress Eyes: negative Neck: no adenopathy, no carotid bruit, no JVD, supple, symmetrical, trachea midline and thyroid not enlarged, symmetric, no tenderness/mass/nodules Resp: clear to auscultation bilaterally Chest wall: no tenderness Cardio: regular rate and rhythm, S1, S2 normal, no murmur, click, rub or gallop GI: soft, non-tender; bowel sounds normal; no masses,  no organomegaly Extremities: extremities normal, atraumatic, no cyanosis or edema Pulses: 2+ and symmetric Skin: Skin color, texture, turgor normal. No rashes or lesions Neurologic: Grossly normal  Results for orders placed or performed during the hospital encounter of 03/17/16 (from the past 48 hour(s))  CBC with Differential     Status: Abnormal   Collection Time:  03/17/16 11:09 AM  Result Value Ref Range   WBC 17.1 (H) 4.0 - 10.5 K/uL   RBC 4.64 3.87 - 5.11 MIL/uL   Hemoglobin 15.0 12.0 - 15.0 g/dL   HCT 45.8 36.0 - 46.0 %   MCV 98.7 78.0 - 100.0 fL   MCH 32.3 26.0 - 34.0 pg   MCHC 32.8 30.0 - 36.0 g/dL   RDW 13.6 11.5 - 15.5 %   Platelets 491 (H) 150 - 400 K/uL   Neutrophils Relative % 78 %   Neutro Abs 13.4 (H) 1.7 - 7.7 K/uL   Lymphocytes Relative 12 %   Lymphs Abs 2.0 0.7 - 4.0 K/uL   Monocytes Relative 10 %   Monocytes Absolute 1.6 (H) 0.1 - 1.0 K/uL   Eosinophils Relative 0 %   Eosinophils Absolute 0.0 0.0 - 0.7 K/uL   Basophils Relative 0 %   Basophils  Absolute 0.0 0.0 - 0.1 K/uL  Comprehensive metabolic panel     Status: Abnormal   Collection Time: 03/17/16 11:09 AM  Result Value Ref Range   Sodium 135 135 - 145 mmol/L   Potassium 4.5 3.5 - 5.1 mmol/L   Chloride 103 101 - 111 mmol/L   CO2 24 22 - 32 mmol/L   Glucose, Bld 128 (H) 65 - 99 mg/dL   BUN 16 6 - 20 mg/dL   Creatinine, Ser 1.16 (H) 0.44 - 1.00 mg/dL   Calcium 9.2 8.9 - 10.3 mg/dL   Total Protein 6.1 (L) 6.5 - 8.1 g/dL   Albumin 3.0 (L) 3.5 - 5.0 g/dL   AST 24 15 - 41 U/L   ALT 29 14 - 54 U/L   Alkaline Phosphatase 80 38 - 126 U/L   Total Bilirubin 1.2 0.3 - 1.2 mg/dL   GFR calc non Af Amer 43 (L) >60 mL/min   GFR calc Af Amer 50 (L) >60 mL/min    Comment: (NOTE) The eGFR has been calculated using the CKD EPI equation. This calculation has not been validated in all clinical situations. eGFR's persistently <60 mL/min signify possible Chronic Kidney Disease.    Anion gap 8 5 - 15  Magnesium     Status: None   Collection Time: 03/17/16 11:09 AM  Result Value Ref Range   Magnesium 2.0 1.7 - 2.4 mg/dL  Brain natriuretic peptide     Status: Abnormal   Collection Time: 03/17/16 11:09 AM  Result Value Ref Range   B Natriuretic Peptide 631.6 (H) 0.0 - 100.0 pg/mL  I-Stat Troponin, ED (not at [facility])     Status: None   Collection Time: 03/17/16 11:36 AM  Result  Value Ref Range   Troponin i, poc 0.01 0.00 - 0.08 ng/mL   Comment 3            Comment: Due to the release kinetics of cTnI, a negative result within the first hours of the onset of symptoms does not rule out myocardial infarction with certainty. If myocardial infarction is still suspected, repeat the test at appropriate intervals.   Procalcitonin - Baseline     Status: None   Collection Time: 03/17/16 11:37 AM  Result Value Ref Range   Procalcitonin <0.10 ng/mL    Comment:        Interpretation: PCT (Procalcitonin) <= 0.5 ng/mL: Systemic infection (sepsis) is not likely. Local bacterial infection is possible. (NOTE)         ICU PCT Algorithm               Non ICU PCT Algorithm    ----------------------------     ------------------------------         PCT < 0.25 ng/mL                 PCT < 0.1 ng/mL     Stopping of antibiotics            Stopping of antibiotics       strongly encouraged.               strongly encouraged.    ----------------------------     ------------------------------       PCT level decrease by               PCT < 0.25 ng/mL       >= 80% from peak PCT       OR PCT 0.25 - 0.5 ng/mL  Stopping of antibiotics                                             encouraged.     Stopping of antibiotics           encouraged.    ----------------------------     ------------------------------       PCT level decrease by              PCT >= 0.25 ng/mL       < 80% from peak PCT        AND PCT >= 0.5 ng/mL            Continuin g antibiotics                                              encouraged.       Continuing antibiotics            encouraged.    ----------------------------     ------------------------------     PCT level increase compared          PCT > 0.5 ng/mL         with peak PCT AND          PCT >= 0.5 ng/mL             Escalation of antibiotics                                          strongly encouraged.      Escalation of antibiotics         strongly encouraged.   I-stat chem 8, ed     Status: Abnormal   Collection Time: 03/17/16 11:38 AM  Result Value Ref Range   Sodium 137 135 - 145 mmol/L   Potassium 4.4 3.5 - 5.1 mmol/L   Chloride 103 101 - 111 mmol/L   BUN 17 6 - 20 mg/dL   Creatinine, Ser 1.00 0.44 - 1.00 mg/dL   Glucose, Bld 129 (H) 65 - 99 mg/dL   Calcium, Ion 1.07 (L) 1.12 - 1.23 mmol/L   TCO2 23 0 - 100 mmol/L   Hemoglobin 15.6 (H) 12.0 - 15.0 g/dL   HCT 46.0 36.0 - 46.0 %  I-Stat CG4 Lactic Acid, ED     Status: None   Collection Time: 03/17/16 11:39 AM  Result Value Ref Range   Lactic Acid, Venous 1.36 0.5 - 1.9 mmol/L   Dg Chest Portable 1 View  Result Date: 03/17/2016 CLINICAL DATA:  82 year old female with cardiac palpitations and shortness of breath. EXAM: PORTABLE CHEST 1 VIEW COMPARISON:  Prior chest x-ray and CT scan of the chest 03/06/2016 FINDINGS: Similar opacification of the left hemi thorax. The left-sided pleural effusion is slightly larger than previously noted. Associated left lower lobe atelectasis. Chronic bronchitic changes are similar compared to prior. Left upper lobe pulmonary nodule as seen on prior CT. No overt pulmonary edema. Small right effusion suspected. No acute osseous abnormality. IMPRESSION: Slightly enlarged left pleural effusion with associated left lower lobe atelectasis. Suspect trace right pleural effusion as well. Left upper lobe pulmonary nodule as seen on  recent prior imaging. Electronically Signed   By: Jacqulynn Cadet M.D.   On: 03/17/2016 11:49   Labs:   Lab Results  Component Value Date   WBC 17.1 (H) 03/17/2016   HGB 15.6 (H) 03/17/2016   HCT 46.0 03/17/2016   MCV 98.7 03/17/2016   PLT 491 (H) 03/17/2016    Recent Labs Lab 03/17/16 1109 03/17/16 1138  NA 135 137  K 4.5 4.4  CL 103 103  CO2 24  --   BUN 16 17  CREATININE 1.16* 1.00  CALCIUM 9.2  --   PROT 6.1*  --   BILITOT 1.2  --   ALKPHOS 80  --   ALT 29  --   AST 24  --   GLUCOSE 128* 129*     Lipid Panel     Component Value Date/Time   CHOL 200 11/01/2015 1152   TRIG 107.0 11/01/2015 1152   HDL 72.60 11/01/2015 1152   CHOLHDL 3 11/01/2015 1152   VLDL 21.4 11/01/2015 1152   LDLCALC 106 (H) 11/01/2015 1152    BNP (last 3 results)  Recent Labs  03/17/16 1109  BNP 631.6*    HEMOGLOBIN A1C Lab Results  Component Value Date   HGBA1C 5.3 11/01/2015    Recent Labs  11/01/15 1152  TSH 3.13    EKG 03/17/2016:   A. Flutter with RVR.   Lexiscan myoview stress test 03/14/2016: 1. The resting electrocardiogram demonstrated normal sinus rhythm, normal resting conduction, no resting arrhythmias and normal rest repolarization.  Low voltage.   Stress symptoms included dizziness. The stress test was terminated because of the end of the pharmacologic testing. 2. The LV is small in size. Myocardial perfusion imaging is normal. Overall left ventricular systolic function was normal without regional wall motion abnormalities. The left ventricular ejection fraction was normal visually and calculated at 48%.   This is a low risk study.   Current Facility-Administered Medications:  .  diltiazem (CARDIZEM) 100 mg in dextrose 5 % 100 mL (1 mg/mL) infusion, 5-15 mg/hr, Intravenous, Continuous, Duffy Bruce, MD, Last Rate: 15 mL/hr at 03/17/16 1327, 15 mg/hr at 03/17/16 1327 .  etomidate (AMIDATE) injection 8.9 mg, 0.1 mg/kg, Intravenous, Once, Duffy Bruce, MD .  furosemide (LASIX) injection 40 mg, 40 mg, Intravenous, Once, Duffy Bruce, MD  Current Outpatient Prescriptions:  .  albuterol (PROVENTIL HFA;VENTOLIN HFA) 108 (90 BASE) MCG/ACT inhaler, Inhale 2 puffs into the lungs every 4 (four) hours as needed for wheezing., Disp: 1 Inhaler, Rfl: 5 .  Ascorbic Acid (VITAMIN C) 1000 MG tablet, Take 1,000 mg by mouth daily.  , Disp: , Rfl:  .  B Complex Vitamins (VITAMIN B COMPLEX PO), Take 1 tablet by mouth daily., Disp: , Rfl:  .  Biotin 10 MG TABS, Take 1 tablet by mouth every  morning. , Disp: , Rfl:  .  Calcium Carbonate-Vit D-Min 600-400 MG-UNIT TABS, Take 1 tablet by mouth every morning. , Disp: , Rfl:  .  chlorhexidine (PERIDEX) 0.12 % solution, Use as directed 5-15 mLs in the mouth or throat daily as needed (for breakouts). Reported on 03/06/2016, Disp: , Rfl:  .  Cranberry 500 MG CAPS, Take 1 capsule by mouth daily. , Disp: , Rfl:  .  cyclobenzaprine (FLEXERIL) 10 MG tablet, Take 1 tablet (10 mg total) by mouth at bedtime as needed., Disp: 90 tablet, Rfl: 0 .  estradiol (ESTRACE) 1 MG tablet, Take 1 tablet (1 mg total) by mouth daily., Disp: 90 tablet, Rfl: 3 .  flecainide (TAMBOCOR) 100 MG tablet, Take 100 mg by mouth 2 (two) times daily. , Disp: , Rfl:  .  fluticasone (FLONASE) 50 MCG/ACT nasal spray, Place 1 spray into both nostrils daily as needed for allergies or rhinitis., Disp: , Rfl:  .  folic acid (FOLVITE) 1 MG tablet, Take 1 mg by mouth daily., Disp: , Rfl:  .  gabapentin (NEURONTIN) 100 MG capsule, Take 1 capsule (100 mg total) by mouth 3 (three) times daily as needed., Disp: 90 capsule, Rfl: 0 .  ibuprofen (ADVIL,MOTRIN) 200 MG tablet, Take 400 mg by mouth at bedtime. For leg pain/discomfort, Disp: , Rfl:  .  levothyroxine (SYNTHROID, LEVOTHROID) 88 MCG tablet, Take 1 tablet (88 mcg total) by mouth daily with breakfast., Disp: 90 tablet, Rfl: 3 .  Lutein 20 MG CAPS, Take 1 capsule by mouth daily. , Disp: , Rfl:  .  methotrexate (RHEUMATREX) 2.5 MG tablet, Take 7.5 mg by mouth every 7 (seven) days. [facility]), Disp: , Rfl:  .  metoprolol tartrate (LOPRESSOR) 25 MG tablet, Take 1 tablet (25 mg total) by mouth 2 (two) times daily., Disp: 60 tablet, Rfl: 1 .  montelukast (SINGULAIR) 10 MG tablet, Take 1 tablet (10 mg total) by mouth at bedtime., Disp: 90 tablet, Rfl: 3 .  Multiple Vitamin (MULTIVITAMIN) capsule, Take 1 capsule by mouth daily.  , Disp: , Rfl:  .  Pumpkin Seed-Soy Germ (AZO BLADDER CONTROL/GO-LESS PO), Take 310 mg by mouth 3 (three) times  daily., Disp: , Rfl:  .  rivaroxaban (XARELTO) 20 MG TABS tablet, Take 1 tablet (20 mg total) by mouth daily with supper., Disp: 30 tablet, Rfl: 2 .  TURMERIC PO, Take 1 capsule by mouth daily., Disp: , Rfl:  .  vitamin B-12 (CYANOCOBALAMIN) 500 MCG tablet, Take 500 mcg by mouth daily., Disp: , Rfl:     Assessment/Plan 1. Parosysmal Flutter (CHA2DS2-VASCScore: Risk Score 3,  Yearly risk of stroke 3.2%. Recommendation: ASA No/Anticoagulation yes; presently on Xarelto. [place] Has Bled: Score 1.  Estimated risk of major bleeding at 1 year with [place] 1.02-1.5%) 2. Acute diastolic heart failure due to RVR.  3. H/O Bronchial asthma.  Recommendation: Bedside cardioversion performed in ED as patient had been adequately anticoagulated with successful conversion to NSR. BNP elevated, will give one dose of furosemide and admit for observation over night. Patient has been scheduled for A. Flutter ablation by Dr. Paulino Rily on 03/29/2016. I had reduced the dose of Flecainide on the OP visit, increase it back to 100 mg BID.  Will avoid albuterol and patient may benefit from change to Xopenex inhaler.  Adrian Prows, MD 03/17/2016, 5:10 PM [facility] [place] Pager: [phone] Office: [phone] If no answer: Cell:  [phone]

## 2016-03-18 DIAGNOSIS — I4891 Unspecified atrial fibrillation: Secondary | ICD-10-CM | POA: Diagnosis not present

## 2016-03-18 MED ORDER — POTASSIUM CHLORIDE ER 20 MEQ PO TBCR: 10.0000 meq | EXTENDED_RELEASE_TABLET | Freq: Every day | ORAL | 0 refills | Status: DC

## 2016-03-18 MED ORDER — FUROSEMIDE 20 MG PO TABS: 20.0000 mg | ORAL_TABLET | Freq: Every day | ORAL | 0 refills | Status: DC

## 2016-03-18 NOTE — Plan of Care (Signed)
Problem: Safety: Goal: Ability to remain free from injury will improve Outcome: Progressing Per RN fall risk assessment patient a high fall risk.  Patient educated on this and RN instructed patient to call and wait for staff assistance prior to getting out of bed.  Patient stated understanding.

## 2016-03-18 NOTE — Discharge Instructions (Signed)
Cardiac Ablation  Cardiac ablation is a procedure to disable a small amount of heart tissue in very specific places. The heart has many electrical connections. Sometimes these connections are abnormal and can cause the heart to beat very fast or irregularly. By disabling some of the problem areas, heart rhythm can be improved or made normal. Ablation is done for people who:    Have Wolff-Parkinson-White syndrome.    Have other fast heart rhythms (tachycardia).    Have taken medicines for an abnormal heart rhythm (arrhythmia) that resulted in:     No success.     Side effects.    May have a high-risk heartbeat that could result in death.   LET YOUR HEALTH CARE PROVIDER KNOW ABOUT:    Any allergies you have or any previous reactions you have had to X-ray dye, food (such as seafood), medicine, or tape.    All medicines you are taking, including vitamins, herbs, eye drops, creams, and over-the-counter medicines.    Previous problems you or members of your family have had with the use of anesthetics.    Any blood disorders you have.    Previous surgeries or procedures (such as a kidney transplant) you have had.    Medical conditions you have (such as kidney failure).   RISKS AND COMPLICATIONS  Generally, cardiac ablation is a safe procedure. However, problems can occur and include:    Increased risk of cancer. Depending on how long it takes to do the ablation, the dose of radiation can be high.   Bruising and bleeding where a thin, flexible tube (catheter) was inserted during the procedure.    Bleeding into the chest, especially into the sac that surrounds the heart (serious).   Need for a permanent pacemaker if the normal electrical system is damaged.    The procedure may not be fully effective, and this may not be recognized for months. Repeat ablation procedures are sometimes required.  BEFORE THE PROCEDURE    Follow any instructions from your health care provider regarding eating and  drinking before the procedure.    Take your medicines as directed at regular times with water, unless instructed otherwise by your health care provider. If you are taking diabetes medicine, including insulin, ask how you are to take it and if there are any special instructions you should follow. It is common to adjust insulin dosing the day of the ablation.   PROCEDURE   An ablation is usually performed in a catheterization laboratory with the guidance of fluoroscopy. Fluoroscopy is a type of X-ray that helps your health care provider see images of your heart during the procedure.    An ablation is a minimally invasive procedure. This means a small cut (incision) is made in either your neck or groin. Your health care provider will decide where to make the incision based on your medical history and physical exam.   An IV tube will be started before the procedure begins. You will be given an anesthetic or medicine to help you relax (sedative).   The skin on your neck or groin will be numbed. A needle will be inserted into a large vein in your neck or groin and catheters will be threaded to your heart.   A special dye that shows up on fluoroscopy pictures may be injected through the catheter. The dye helps your health care provider see the area of the heart that needs treatment.   The catheter has electrodes on the tip. When the area   of heart tissue that is causing the arrhythmia is found, the catheter tip will send an electrical current to the area and "scar" the tissue. Three types of energy can be used to ablate the heart tissue:     Heat (radiofrequency energy).     Laser energy.     Extreme cold (cryoablation).    When the area of the heart has been ablated, the catheter will be taken out. Pressure will be held on the insertion site. This will help the insertion site clot and keep it from bleeding. A bandage will be placed on the insertion site.   AFTER THE PROCEDURE    After the procedure, you  will be taken to a recovery area where your vital signs (blood pressure, heart rate, and breathing) will be monitored. The insertion site will also be monitored for bleeding.    You will need to lie still for 4-6 hours. This is to ensure you do not bleed from the catheter insertion site.      This information is not intended to replace advice given to you by your health care provider. Make sure you discuss any questions you have with your health care provider.     Document Released: 12/29/2008 Document Revised: 09/02/2014 Document Reviewed: 01/04/2013  [employer]vier Interactive Patient Education 2016 [employer]

## 2016-03-18 NOTE — Discharge Summary (Signed)
Physician Discharge Summary  Patient ID: Linda Mcgrath MRN: [id number] DOB/AGE: 80-Jul-1934 80 y.o.  Admit date: 03/17/2016 Discharge date: 03/18/2016  Discharge Diagnoses: 1. Parosysmal Flutter (CHA2DS2-VASCScore: Risk Score 3, Yearly risk of stroke 3.2%. Recommendation: ASA No/Anticoagulation yes; presently on Xarelto. OAC Has Bled: Score 1. Estimated risk of major bleeding at 1 year with OAC 1.02-1.5%) 2. Acute diastolic heart failure due to RVR.  3. H/O Bronchial asthma.  Hospital Course: Linda Mcgrath  is a 82 y.o. female with a recent history of new onset atrial flutter but with no other significant past medical history.  On 03/01/2016, she developed a severe headache, SOB, and sharp, "electric like" in her chest pain back. She called her PCP on 03/04/2016 and had neck xray and CT scan. Chart note documents heart rate of 117 at that time. She returned on 03/06/2016 for follow up and heart rate was in the 150s and she was sent to the ED for further evaluation. On 03/07/2016, she underwent TEE guided cardioversion with successful conversion to sinus rhythm. She was started on Flecainide 100mg  bid for maintenance of sinus rhythm and scheduled for outpatient follow up with Dr. Loman Brooklyn for consideration for ablation.  She called the office on 03/12/2016 requesting to be seen due to malaise and signficant fatigue. Also report dyspnea on exertion which has been ongoing for 1-2 weeks. She was still in sinus rhythm and flecainide was reduced to try to minimize symptoms.  She returned to the ED again on 03/17/2016 with palpitations and shortness of breath, again noted to be in atrial flutter. She underwent bedside cardioversion in ED as patient had been adequately anticoagulated with successful conversion to NSR. BNP was elevated, was given one dose of furosemide and admitted for observation over night  Recommendations on discharge: Increase flecainide back to 100mg  bid, proceed  with ablation as scheduled on 03/29/2016.  Discharge Exam: Blood pressure 137/62, pulse 80, temperature 97.8 F (36.6 C), temperature source Oral, resp. rate (!) 24, height 5\' 5"  (1.651 m), weight 78.4 kg (172 lb 14.4 oz), last menstrual period 08/26/1969, SpO2 96 %.    General appearance: alert, cooperative and no distress Eyes: negative Neck: no adenopathy, no carotid bruit, no JVD, supple, symmetrical, trachea midline and thyroid not enlarged, symmetric, no tenderness/mass/nodules Resp: clear to auscultation bilaterally Chest wall: no tenderness Cardio: regular rate and rhythm, S1, S2 normal, no murmur, click, rub or gallop GI: soft, non-tender; bowel sounds normal; no masses,  no organomegaly Extremities: extremities normal, atraumatic, no cyanosis or edema Pulses: 2+ and symmetric Skin: Skin color, texture, turgor normal. No rashes or lesions Neurologic: Grossly normal  Labs:   Lab Results  Component Value Date   WBC 17.1 (H) 03/17/2016   HGB 15.6 (H) 03/17/2016   HCT 46.0 03/17/2016   MCV 98.7 03/17/2016   PLT 491 (H) 03/17/2016    Recent Labs Lab 03/17/16 1109 03/17/16 1138  NA 135 137  K 4.5 4.4  CL 103 103  CO2 24  --   BUN 16 17  CREATININE 1.16* 1.00  CALCIUM 9.2  --   PROT 6.1*  --   BILITOT 1.2  --   ALKPHOS 80  --   ALT 29  --   AST 24  --   GLUCOSE 128* 129*    Lipid Panel     Component Value Date/Time   CHOL 200 11/01/2015 1152   TRIG 107.0 11/01/2015 1152   HDL 72.60 11/01/2015 1152   CHOLHDL 3 11/01/2015  1152   VLDL 21.4 11/01/2015 1152   LDLCALC 106 (H) 11/01/2015 1152    BNP (last 3 results)  Recent Labs  03/17/16 1109  BNP 631.6*    HEMOGLOBIN A1C Lab Results  Component Value Date   HGBA1C 5.3 11/01/2015    Cardiac Panel (last 3 results) No results for input(s): CKTOTAL, CKMB, TROPONINI, RELINDX in the last 8760 hours.  No results found for: CKTOTAL, CKMB, CKMBINDEX, TROPONINI   TSH  Recent Labs  11/01/15 1152  TSH  3.13    EKG: Sinus rhythm, rightward axis, PRWP, diffuse nonspecific T wave flattening.   Radiology: Dg Cervical Spine Complete  Result Date: 03/04/2016 CLINICAL DATA:  Paresthesias.  Numbness left upper back EXAM: CERVICAL SPINE - COMPLETE 4+ VIEW COMPARISON:  None. FINDINGS: Diffuse degenerative disc and facet disease in the cervical spine. Slight anterolisthesis of C3 on C4 and C4 on C5 related to facet disease. No fracture. Moderate to severe left neural foraminal narrowing from C3-4 through C5-6. Mild to moderate right neural foraminal narrowing at C3-4 and C4-5. IMPRESSION: Degenerative changes as above with bilateral neural foraminal narrowing. No acute findings. Electronically Signed   By: Charlett Nose M.D.   On: 03/04/2016 10:29   Ct Head Wo Contrast  Result Date: 03/04/2016 CLINICAL DATA:  Frontal headaches 2 days ago, initial encounter EXAM: CT HEAD WITHOUT CONTRAST TECHNIQUE: Contiguous axial images were obtained from the base of the skull through the vertex without intravenous contrast. COMPARISON:  None. FINDINGS: Bony calvarium is intact. Mild atrophic changes are noted commenced with the patient's given age. Scattered areas of decreased attenuation are noted in the deep white matter consistent with chronic white matter ischemic change. No findings to suggest acute hemorrhage, acute infarction or space-occupying mass lesion are seen. IMPRESSION: Chronic atrophic and ischemic changes. No acute abnormality is noted. Electronically Signed   By: Alcide Clever M.D.   On: 03/04/2016 14:28   Ct Angio Chest Pe W/cm &/or Wo Cm  Result Date: 03/06/2016 CLINICAL DATA:  Friday last week patient developed severe headaches, shortness of breath, and sharp chest and back pain. Patient saw primary care physician on Monday and had a CT scan. Patient's heart rate is increasing and she is sent to the ED for further evaluation. Atrial fibrillation. Elevated D-dimer. EXAM: CT ANGIOGRAPHY CHEST WITH CONTRAST  TECHNIQUE: Multidetector CT imaging of the chest was performed using the standard protocol during bolus administration of intravenous contrast. Multiplanar CT image reconstructions and MIPs were obtained to evaluate the vascular anatomy. CONTRAST:  65 mL Isovue 370 COMPARISON:  None. FINDINGS: Technically adequate examination with good opacification of the central and segmental pulmonary arteries. No focal filling defects are demonstrated. No evidence of significant pulmonary embolus. Mild cardiac enlargement. Small pericardial effusion. Coronary artery calcifications. Normal caliber thoracic aorta. Scattered lymph nodes in the mediastinum are not pathologically enlarged. Largest pretracheal lymph node measures about 10 mm short axis dimension. Esophagus is decompressed. Small bilateral pleural effusions, greater on the left. Consolidation or atelectasis in the left lung base. Solid circumscribed noncalcified nodule in the left upper lung measuring 9 mm diameter. No other focal pulmonary nodules identified. Consider one of the following in 3 months for both low-risk and high-risk individuals: (a) repeat chest CT, (b) follow-up PET-CT, or (c) tissue sampling. This recommendation follows the consensus statement: Guidelines for Management of Incidental Pulmonary Nodules Detected on CT Images:From the Fleischner Society 2017; published online before print (10.1148/radiol.[id number]). Patchy peripheral interstitial changes in the lungs may represent early  edema. No pneumothorax. Included portions of the upper abdominal organs demonstrate surgical absence of the gallbladder. Two hypodense lesions demonstrated in the liver, right posterior lobe at 10 mm and lateral left lobe at 10 mm. Left lobe lesion corresponds with small cyst demonstrated on ultrasound of the liver from 07/13/2009. The right lobe lesion is also probably a cyst although too small to characterize. Degenerative changes throughout the spine. No destructive  bone lesions. Review of the MIP images confirms the above findings. IMPRESSION: No evidence of significant pulmonary embolus. Bilateral pleural effusions, greater on the left, with consolidation or atelectasis in the left lung base. Indeterminate 9 mm nodule in the left upper lung. See followup recommendations above. Cardiac enlargement and small pericardial effusion. Probable hepatic cysts. Electronically Signed   By: Burman Nieves M.D.   On: 03/06/2016 21:26   Dg Chest Portable 1 View  Result Date: 03/17/2016 CLINICAL DATA:  82 year old female with cardiac palpitations and shortness of breath. EXAM: PORTABLE CHEST 1 VIEW COMPARISON:  Prior chest x-ray and CT scan of the chest 03/06/2016 FINDINGS: Similar opacification of the left hemi thorax. The left-sided pleural effusion is slightly larger than previously noted. Associated left lower lobe atelectasis. Chronic bronchitic changes are similar compared to prior. Left upper lobe pulmonary nodule as seen on prior CT. No overt pulmonary edema. Small right effusion suspected. No acute osseous abnormality. IMPRESSION: Slightly enlarged left pleural effusion with associated left lower lobe atelectasis. Suspect trace right pleural effusion as well. Left upper lobe pulmonary nodule as seen on recent prior imaging. Electronically Signed   By: Malachy Moan M.D.   On: 03/17/2016 11:49  Dg Chest Portable 1 View  Result Date: 03/06/2016 CLINICAL DATA:  Shortness of breath and tachycardia tonight.  Cough. EXAM: PORTABLE CHEST 1 VIEW COMPARISON:  12/11/2015 FINDINGS: The heart is mildly enlarged but stable. Moderate tortuosity and ectasia of the thoracic aorta. There is a left lower lobe process likely a combination of infiltrate, atelectasis and effusion. The right lung is clear. The bony thorax is intact. IMPRESSION: Left lower lobe opacity likely a combination of infiltrate, atelectasis and effusion. Followup PA and lateral chest X-ray is recommended in 3-4  weeks following trial of antibiotic therapy to ensure resolution and exclude underlying malignancy. Electronically Signed   By: Rudie Meyer M.D.   On: 03/06/2016 18:34      FOLLOW UP PLANS AND APPOINTMENTS    Medication List    STOP taking these medications   albuterol 108 (90 Base) MCG/ACT inhaler Commonly known as:  PROVENTIL HFA;VENTOLIN HFA     TAKE these medications   AZO BLADDER CONTROL/GO-LESS PO Take 310 mg by mouth 3 (three) times daily.   Biotin 10 MG Tabs Take 1 tablet by mouth every morning.   Calcium Carbonate-Vit D-Min 600-400 MG-UNIT Tabs Take 1 tablet by mouth every morning.   chlorhexidine 0.12 % solution Commonly known as:  PERIDEX Use as directed 5-15 mLs in the mouth or throat daily as needed (for breakouts). Reported on 03/06/2016   Cranberry 500 MG Caps Take 1 capsule by mouth daily.   cyclobenzaprine 10 MG tablet Commonly known as:  FLEXERIL Take 1 tablet (10 mg total) by mouth at bedtime as needed.   estradiol 1 MG tablet Commonly known as:  ESTRACE Take 1 tablet (1 mg total) by mouth daily.   flecainide 100 MG tablet Commonly known as:  TAMBOCOR Take 100 mg by mouth 2 (two) times daily.   fluticasone 50 MCG/ACT nasal spray Commonly  known as:  FLONASE Place 1 spray into both nostrils daily as needed for allergies or rhinitis.   folic acid 1 MG tablet Commonly known as:  FOLVITE Take 1 mg by mouth daily.   furosemide 20 MG tablet Commonly known as:  LASIX Take 1 tablet (20 mg total) by mouth daily.   gabapentin 100 MG capsule Commonly known as:  NEURONTIN Take 1 capsule (100 mg total) by mouth 3 (three) times daily as needed.   ibuprofen 200 MG tablet Commonly known as:  ADVIL,MOTRIN Take 400 mg by mouth at bedtime. For leg pain/discomfort   levothyroxine 88 MCG tablet Commonly known as:  SYNTHROID, LEVOTHROID Take 1 tablet (88 mcg total) by mouth daily with breakfast.   Lutein 20 MG Caps Take 1 capsule by mouth daily.    methotrexate 2.5 MG tablet Commonly known as:  RHEUMATREX Take 7.5 mg by mouth every 7 (seven) days. [facility])   metoprolol tartrate 25 MG tablet Commonly known as:  LOPRESSOR Take 1 tablet (25 mg total) by mouth 2 (two) times daily.   montelukast 10 MG tablet Commonly known as:  SINGULAIR Take 1 tablet (10 mg total) by mouth at bedtime.   multivitamin capsule Take 1 capsule by mouth daily.   Potassium Chloride ER 20 MEQ Tbcr Take 10 mEq by mouth daily.   rivaroxaban 20 MG Tabs tablet Commonly known as:  XARELTO Take 1 tablet (20 mg total) by mouth daily with supper.   TURMERIC PO Take 1 capsule by mouth daily.   VITAMIN B COMPLEX PO Take 1 tablet by mouth daily.   vitamin B-12 500 MCG tablet Commonly known as:  CYANOCOBALAMIN Take 500 mcg by mouth daily.   vitamin C 1000 MG tablet Take 1,000 mg by mouth daily.      Follow-up Information    Yates Decamp, MD .   Specialty:  Cardiology Why:  As scheduled Contact information: [address] [place] [address] [phone]        Will [name], MD .   Specialty:  Cardiology Why:  As scheduled Contact information: [address] [place] [address] [phone]           Erling Conte, NP-C 03/18/2016, 8:15 AM  Pager: [phone] Office: [phone] If no answer: [phone]  I have personally reviewed the patient's record and performed physical exam and agree with the assessment and plan of Ms. Marcy Salvo, NP-C. Add furosemide  For diastolic heart failure and replete Arnetha Courser, MD 03/18/2016, 10:11 AM [facility]. [place] Pager: [phone] Office: [phone] If no answer: Cell:  [phone]

## 2016-03-18 NOTE — Plan of Care (Signed)
Problem: Physical Regulation: Goal: Ability to maintain clinical measurements within normal limits will improve Outcome: Progressing Patient cardioverted in emergency department, received one shock per MD note.  Patient continues to maintain normal sinus rhythm.

## 2016-03-18 NOTE — Plan of Care (Signed)
Problem: Education: Goal: Knowledge of [facility] Education information/materials will improve Outcome: Progressing Patient aware of plan of care.  RN provided medication education on all medications administered thus far this shift.     

## 2016-03-18 NOTE — Progress Notes (Signed)
Discharge teaching and instructions reviewed.no further questions. Pt discharging home via son.

## 2016-03-29 ENCOUNTER — Ambulatory Visit (HOSPITAL_COMMUNITY): Admit: 2016-03-29 | Payer: Self-pay | Admitting: Cardiology

## 2016-03-29 ENCOUNTER — Ambulatory Visit (HOSPITAL_COMMUNITY): Payer: Medicare Other | Admitting: Anesthesiology

## 2016-03-29 ENCOUNTER — Ambulatory Visit (HOSPITAL_COMMUNITY)
Admission: RE | Admit: 2016-03-29 | Discharge: 2016-03-29 | Disposition: A | Discharge status: Home / Self Care | Payer: Medicare Other | Source: Ambulatory Visit | Attending: Cardiology | Admitting: Cardiology

## 2016-03-29 ENCOUNTER — Encounter (HOSPITAL_COMMUNITY): Payer: Self-pay

## 2016-03-29 DIAGNOSIS — Z5329 Procedure and treatment not carried out because of patient's decision for other reasons: Secondary | ICD-10-CM | POA: Diagnosis not present

## 2016-03-29 DIAGNOSIS — I484 Atypical atrial flutter: Secondary | ICD-10-CM | POA: Insufficient documentation

## 2016-03-29 DIAGNOSIS — M797 Fibromyalgia: Secondary | ICD-10-CM | POA: Insufficient documentation

## 2016-03-29 DIAGNOSIS — M1990 Unspecified osteoarthritis, unspecified site: Secondary | ICD-10-CM | POA: Insufficient documentation

## 2016-03-29 DIAGNOSIS — E039 Hypothyroidism, unspecified: Secondary | ICD-10-CM | POA: Insufficient documentation

## 2016-03-29 LAB — BASIC METABOLIC PANEL
Anion gap: 7 (ref 5–15)
BUN: 15 mg/dL (ref 6–20)
CO2: 27 mmol/L (ref 22–32)
Calcium: 9 mg/dL (ref 8.9–10.3)
Chloride: 106 mmol/L (ref 101–111)
Creatinine, Ser: 1.02 mg/dL — ABNORMAL HIGH (ref 0.44–1.00)
GFR calc Af Amer: 59 mL/min — ABNORMAL LOW (ref >60)
GFR calc non Af Amer: 51 mL/min — ABNORMAL LOW (ref >60)
Glucose, Bld: 93 mg/dL (ref 65–99)
Potassium: 4 mmol/L (ref 3.5–5.1)
Sodium: 140 mmol/L (ref 135–145)

## 2016-03-29 LAB — CBC
HCT: 42.9 % (ref 36.0–46.0)
Hemoglobin: 14 g/dL (ref 12.0–15.0)
MCH: 32.1 pg (ref 26.0–34.0)
MCHC: 32.6 g/dL (ref 30.0–36.0)
MCV: 98.4 fL (ref 78.0–100.0)
Platelets: 366 10*3/uL (ref 150–400)
RBC: 4.36 MIL/uL (ref 3.87–5.11)
RDW: 13.3 % (ref 11.5–15.5)
WBC: 7.7 10*3/uL (ref 4.0–10.5)

## 2016-03-29 SURGERY — A-FLUTTER/A-TACH/SVT ABLATION
Anesthesia: Monitor Anesthesia Care

## 2016-03-29 NOTE — H&P (Signed)
Linda Mcgrath has a history of typical appearing atrial flutter.  She presents for ablation.  She has been compliant with her anticoaguliaton.  On exam, regular rhythm, no murmurs, lungs clear.  Risks and benefits of the procedure were explained.  Risks include bleeding, tamponade, heart block, and stroke.  The patient understands the risks and has agreed to the procedure.    Allegra Lai, MD

## 2016-03-29 NOTE — Anesthesia Preprocedure Evaluation (Deleted)
Anesthesia Evaluation  Patient identified by MRN, date of birth, ID band Patient awake    Reviewed: Allergy & Precautions, H&P , NPO status , Patient's Chart, lab work & pertinent test results, reviewed documented beta blocker date and time   Airway Mallampati: II  TM Distance: >3 FB Neck ROM: Full    Dental no notable dental hx. (+) Teeth Intact, Dental Advisory Given   Pulmonary neg pulmonary ROS,    Pulmonary exam normal breath sounds clear to auscultation       Cardiovascular Pt. on medications and Pt. on home beta blockers + dysrhythmias Atrial Fibrillation  Rhythm:Irregular Rate:Normal     Neuro/Psych  Headaches, negative psych ROS   GI/Hepatic negative GI ROS, Neg liver ROS,   Endo/Other  Hypothyroidism   Renal/GU negative Renal ROS  negative genitourinary   Musculoskeletal  (+) Arthritis , Fibromyalgia -  Abdominal   Peds  Hematology negative hematology ROS (+)   Anesthesia Other Findings   Reproductive/Obstetrics negative OB ROS                            Anesthesia Physical Anesthesia Plan  ASA: III  Anesthesia Plan: MAC   Post-op Pain Management:    Induction: Intravenous  Airway Management Planned: Simple Face Mask  Additional Equipment:   Intra-op Plan:   Post-operative Plan:   Informed Consent: I have reviewed the patients History and Physical, chart, labs and discussed the procedure including the risks, benefits and alternatives for the proposed anesthesia with the patient or authorized representative who has indicated his/her understanding and acceptance.   Dental advisory given  Plan Discussed with: CRNA  Anesthesia Plan Comments:        Anesthesia Quick Evaluation

## 2016-03-29 NOTE — Progress Notes (Signed)
Pt did not want to wait for procedure to be done since it had been delayed. Pt will be rescheduled. Iv removed and dc to waiting area, pt ambulated without assist.

## 2016-04-03 ENCOUNTER — Ambulatory Visit (HOSPITAL_COMMUNITY): Payer: Medicare Other | Admitting: Anesthesiology

## 2016-04-03 ENCOUNTER — Ambulatory Visit (HOSPITAL_COMMUNITY)
Admission: RE | Admit: 2016-04-03 | Discharge: 2016-04-03 | Disposition: A | Discharge status: Home / Self Care | Payer: Medicare Other | Source: Ambulatory Visit | Attending: Cardiology | Admitting: Cardiology

## 2016-04-03 ENCOUNTER — Encounter (HOSPITAL_COMMUNITY): Payer: Self-pay | Admitting: Cardiology

## 2016-04-03 ENCOUNTER — Encounter (HOSPITAL_COMMUNITY)
Admission: RE | Disposition: A | Discharge status: Home / Self Care | Payer: Self-pay | Source: Ambulatory Visit | Attending: Cardiology

## 2016-04-03 DIAGNOSIS — M797 Fibromyalgia: Secondary | ICD-10-CM | POA: Diagnosis not present

## 2016-04-03 DIAGNOSIS — I10 Essential (primary) hypertension: Secondary | ICD-10-CM | POA: Insufficient documentation

## 2016-04-03 DIAGNOSIS — I471 Supraventricular tachycardia: Secondary | ICD-10-CM | POA: Diagnosis not present

## 2016-04-03 DIAGNOSIS — F419 Anxiety disorder, unspecified: Secondary | ICD-10-CM | POA: Diagnosis not present

## 2016-04-03 DIAGNOSIS — E039 Hypothyroidism, unspecified: Secondary | ICD-10-CM | POA: Insufficient documentation

## 2016-04-03 DIAGNOSIS — Z8679 Personal history of other diseases of the circulatory system: Secondary | ICD-10-CM | POA: Diagnosis present

## 2016-04-03 DIAGNOSIS — I4892 Unspecified atrial flutter: Secondary | ICD-10-CM | POA: Diagnosis not present

## 2016-04-03 HISTORY — PX: ELECTROPHYSIOLOGIC STUDY: SHX172A

## 2016-04-03 SURGERY — A-FLUTTER/A-TACH/SVT ABLATION
Anesthesia: Monitor Anesthesia Care

## 2016-04-03 MED ORDER — SODIUM CHLORIDE 0.9 % IV SOLN
INTRAVENOUS | Status: DC
  Administered 2016-04-03: 07:00:00 via INTRAVENOUS

## 2016-04-03 MED ORDER — ONDANSETRON HCL 4 MG/2ML IJ SOLN: 4.0000 mg | Freq: Four times a day (QID) | INTRAMUSCULAR | Status: DC | PRN

## 2016-04-03 MED ORDER — POTASSIUM CHLORIDE ER 10 MEQ PO TBCR
10.0000 meq | EXTENDED_RELEASE_TABLET | Freq: Every day | ORAL | Status: DC
  Administered 2016-04-03: 10 meq via ORAL
  Filled 2016-04-03 (×2): qty 1

## 2016-04-03 MED ORDER — CHLORHEXIDINE GLUCONATE 0.12 % MT SOLN: 5.0000 mL | Freq: Every day | OROMUCOSAL | Status: DC | PRN

## 2016-04-03 MED ORDER — HEPARIN (PORCINE) IN NACL 2-0.9 UNIT/ML-% IJ SOLN
INTRAMUSCULAR | Status: AC
  Filled 2016-04-03: qty 500

## 2016-04-03 MED ORDER — METOPROLOL TARTRATE 25 MG PO TABS: 25.0000 mg | ORAL_TABLET | Freq: Two times a day (BID) | ORAL | Status: DC

## 2016-04-03 MED ORDER — FUROSEMIDE 20 MG PO TABS
20.0000 mg | ORAL_TABLET | Freq: Every day | ORAL | Status: DC
  Administered 2016-04-03: 20 mg via ORAL
  Filled 2016-04-03: qty 1

## 2016-04-03 MED ORDER — ACETAMINOPHEN 325 MG PO TABS
650.0000 mg | ORAL_TABLET | ORAL | Status: DC | PRN
  Filled 2016-04-03: qty 2

## 2016-04-03 MED ORDER — ESTRADIOL 1 MG PO TABS
1.0000 mg | ORAL_TABLET | Freq: Every day | ORAL | Status: DC
  Administered 2016-04-03: 1 mg via ORAL
  Filled 2016-04-03: qty 1

## 2016-04-03 MED ORDER — MULTIVITAMINS PO CAPS: 1.0000 | ORAL_CAPSULE | Freq: Every day | ORAL | Status: DC

## 2016-04-03 MED ORDER — METHOTREXATE 2.5 MG PO TABS: 7.5000 mg | ORAL_TABLET | ORAL | Status: DC

## 2016-04-03 MED ORDER — FOLIC ACID 1 MG PO TABS
1.0000 mg | ORAL_TABLET | Freq: Every day | ORAL | Status: DC
  Administered 2016-04-03: 1 mg via ORAL
  Filled 2016-04-03: qty 1

## 2016-04-03 MED ORDER — MEPERIDINE HCL 25 MG/ML IJ SOLN: 6.2500 mg | INTRAMUSCULAR | Status: DC | PRN

## 2016-04-03 MED ORDER — BUPIVACAINE HCL (PF) 0.25 % IJ SOLN
INTRAMUSCULAR | Status: DC | PRN
  Administered 2016-04-03: 35 mL

## 2016-04-03 MED ORDER — FLUTICASONE PROPIONATE 50 MCG/ACT NA SUSP
1.0000 | Freq: Every day | NASAL | Status: DC | PRN
  Administered 2016-04-03: 1 via NASAL
  Filled 2016-04-03: qty 16

## 2016-04-03 MED ORDER — MONTELUKAST SODIUM 10 MG PO TABS: 10.0000 mg | ORAL_TABLET | Freq: Every day | ORAL | Status: DC

## 2016-04-03 MED ORDER — RIVAROXABAN 20 MG PO TABS
20.0000 mg | ORAL_TABLET | Freq: Every day | ORAL | Status: DC
  Administered 2016-04-03: 20 mg via ORAL
  Filled 2016-04-03: qty 1

## 2016-04-03 MED ORDER — ONDANSETRON HCL 4 MG/2ML IJ SOLN: 4.0000 mg | Freq: Once | INTRAMUSCULAR | Status: DC | PRN

## 2016-04-03 MED ORDER — SODIUM CHLORIDE 0.9% FLUSH: 3.0000 mL | Freq: Two times a day (BID) | INTRAVENOUS | Status: DC

## 2016-04-03 MED ORDER — MIDAZOLAM HCL 5 MG/5ML IJ SOLN
INTRAMUSCULAR | Status: DC | PRN
  Administered 2016-04-03: 1 mg via INTRAVENOUS

## 2016-04-03 MED ORDER — CYANOCOBALAMIN 500 MCG PO TABS
500.0000 ug | ORAL_TABLET | Freq: Every day | ORAL | Status: DC
  Filled 2016-04-03: qty 1

## 2016-04-03 MED ORDER — GABAPENTIN 100 MG PO CAPS
100.0000 mg | ORAL_CAPSULE | Freq: Three times a day (TID) | ORAL | Status: DC
  Administered 2016-04-03: 100 mg via ORAL
  Filled 2016-04-03: qty 1

## 2016-04-03 MED ORDER — CRANBERRY 500 MG PO CAPS: 1.0000 | ORAL_CAPSULE | Freq: Every day | ORAL | Status: DC

## 2016-04-03 MED ORDER — LIDOCAINE HCL (CARDIAC) 20 MG/ML IV SOLN
INTRAVENOUS | Status: DC | PRN
  Administered 2016-04-03: 60 mg via INTRAVENOUS

## 2016-04-03 MED ORDER — PHENYLEPHRINE HCL 10 MG/ML IJ SOLN
INTRAVENOUS | Status: DC | PRN
  Administered 2016-04-03: 20 ug/min via INTRAVENOUS

## 2016-04-03 MED ORDER — ONDANSETRON HCL 4 MG/2ML IJ SOLN
INTRAMUSCULAR | Status: DC | PRN
  Administered 2016-04-03: 4 mg via INTRAVENOUS

## 2016-04-03 MED ORDER — CALCIUM CARBONATE-VITAMIN D 500-200 MG-UNIT PO TABS
1.0000 | ORAL_TABLET | Freq: Every day | ORAL | Status: DC
  Administered 2016-04-03: 1 via ORAL
  Filled 2016-04-03: qty 1

## 2016-04-03 MED ORDER — VITAMIN B-12 100 MCG PO TABS
100.0000 ug | ORAL_TABLET | Freq: Every day | ORAL | Status: DC
  Filled 2016-04-03: qty 1

## 2016-04-03 MED ORDER — SODIUM CHLORIDE 0.9% FLUSH: 3.0000 mL | INTRAVENOUS | Status: DC | PRN

## 2016-04-03 MED ORDER — LUTEIN 20 MG PO CAPS: 1.0000 | ORAL_CAPSULE | Freq: Every day | ORAL | Status: DC

## 2016-04-03 MED ORDER — PROPOFOL 10 MG/ML IV BOLUS
INTRAVENOUS | Status: DC | PRN
  Administered 2016-04-03: 10 mg via INTRAVENOUS

## 2016-04-03 MED ORDER — VITAMIN B COMPLEX PO TABS: ORAL_TABLET | Freq: Every day | ORAL | Status: DC

## 2016-04-03 MED ORDER — VITAMIN C 500 MG PO TABS
1000.0000 mg | ORAL_TABLET | Freq: Every day | ORAL | Status: DC
  Administered 2016-04-03: 1000 mg via ORAL
  Filled 2016-04-03: qty 2

## 2016-04-03 MED ORDER — HYDROMORPHONE HCL 1 MG/ML IJ SOLN: 0.2500 mg | INTRAMUSCULAR | Status: DC | PRN

## 2016-04-03 MED ORDER — BUPIVACAINE HCL (PF) 0.25 % IJ SOLN
INTRAMUSCULAR | Status: AC
  Filled 2016-04-03: qty 60

## 2016-04-03 MED ORDER — IBUPROFEN 200 MG PO TABS: 400.0000 mg | ORAL_TABLET | Freq: Every day | ORAL | Status: DC

## 2016-04-03 MED ORDER — LEVOTHYROXINE SODIUM 88 MCG PO TABS: 88.0000 ug | ORAL_TABLET | Freq: Every day | ORAL | Status: DC

## 2016-04-03 MED ORDER — FENTANYL CITRATE (PF) 100 MCG/2ML IJ SOLN
INTRAMUSCULAR | Status: DC | PRN
  Administered 2016-04-03 (×3): 25 ug via INTRAVENOUS

## 2016-04-03 MED ORDER — ADULT MULTIVITAMIN W/MINERALS CH
1.0000 | ORAL_TABLET | Freq: Every day | ORAL | Status: DC
  Administered 2016-04-03: 1 via ORAL
  Filled 2016-04-03: qty 1

## 2016-04-03 MED ORDER — PROPOFOL 500 MG/50ML IV EMUL
INTRAVENOUS | Status: DC | PRN
  Administered 2016-04-03: 50 ug/kg/min via INTRAVENOUS

## 2016-04-03 MED ORDER — HEPARIN (PORCINE) IN NACL 2-0.9 UNIT/ML-% IJ SOLN
INTRAMUSCULAR | Status: DC | PRN
  Administered 2016-04-03: 10:00:00

## 2016-04-03 MED ORDER — BIOTIN 10 MG PO TABS: 1.0000 | ORAL_TABLET | Freq: Every morning | ORAL | Status: DC

## 2016-04-03 MED ORDER — CALCIUM CARBONATE-VIT D-MIN 600-400 MG-UNIT PO TABS: 1.0000 | ORAL_TABLET | Freq: Every morning | ORAL | Status: DC

## 2016-04-03 MED ORDER — FLECAINIDE ACETATE 100 MG PO TABS
100.0000 mg | ORAL_TABLET | Freq: Two times a day (BID) | ORAL | Status: DC
  Administered 2016-04-03: 100 mg via ORAL
  Filled 2016-04-03 (×2): qty 1

## 2016-04-03 MED ORDER — CYCLOBENZAPRINE HCL 10 MG PO TABS: 10.0000 mg | ORAL_TABLET | Freq: Every evening | ORAL | Status: DC | PRN

## 2016-04-03 MED ORDER — SODIUM CHLORIDE 0.9 % IV SOLN: 250.0000 mL | INTRAVENOUS | Status: DC | PRN

## 2016-04-03 SURGICAL SUPPLY — 12 items
BAG SNAP BAND KOVER 36X36 (MISCELLANEOUS) ×3 IMPLANT
CATH EZ STEER NAV 8MM D-F CUR (ABLATOR) ×3 IMPLANT
CATH JOSEPHSON QUAD-ALLRED 6FR (CATHETERS) ×3 IMPLANT
CATH WEBSTER BI DIR CS D-F CRV (CATHETERS) ×3 IMPLANT
PACK EP LATEX FREE (CUSTOM PROCEDURE TRAY) ×2
PACK EP LF (CUSTOM PROCEDURE TRAY) ×1 IMPLANT
PAD DEFIB LIFELINK (PAD) ×3 IMPLANT
PATCH CARTO3 (PAD) ×3 IMPLANT
SHEATH PINNACLE 6F 10CM (SHEATH) ×3 IMPLANT
SHEATH PINNACLE 7F 10CM (SHEATH) ×3 IMPLANT
SHEATH PINNACLE 8F 10CM (SHEATH) ×3 IMPLANT
SHIELD RADPAD SCOOP 12X17 (MISCELLANEOUS) ×3 IMPLANT

## 2016-04-03 NOTE — Anesthesia Preprocedure Evaluation (Addendum)
Anesthesia Evaluation  Patient identified by MRN, date of birth, ID band Patient awake    Reviewed: Allergy & Precautions, NPO status , Patient's Chart, lab work & pertinent test results  Airway Mallampati: II  TM Distance: >3 FB Neck ROM: Full   Comment: Prominent overbite Dental  (+) Teeth Intact   Pulmonary neg pulmonary ROS,    Pulmonary exam normal        Cardiovascular Exercise Tolerance: Poor hypertension, + DOE   Rhythm:Irregular Rate:Abnormal  PAF  A.fluter   Neuro/Psych Anxiety Fibromyalgia  Neuromuscular disease    GI/Hepatic negative GI ROS, Neg liver ROS,   Endo/Other  Hypothyroidism   Renal/GU negative Renal ROS  negative genitourinary   Musculoskeletal  (+) Arthritis , Fibromyalgia -  Abdominal (+) + obese,   Peds  Hematology negative hematology ROS (+)   Anesthesia Other Findings   Reproductive/Obstetrics                            Anesthesia Physical Anesthesia Plan  ASA: III  Anesthesia Plan: MAC   Post-op Pain Management:    Induction: Intravenous  Airway Management Planned: Mask  Additional Equipment:   Intra-op Plan:   Post-operative Plan:   Informed Consent: I have reviewed the patients History and Physical, chart, labs and discussed the procedure including the risks, benefits and alternatives for the proposed anesthesia with the patient or authorized representative who has indicated his/her understanding and acceptance.     Plan Discussed with: CRNA and Surgeon  Anesthesia Plan Comments:        Anesthesia Quick Evaluation

## 2016-04-03 NOTE — Progress Notes (Signed)
The patient was seen and examined by Dr. Curt Bears and cleared for discharge. Procedure site is stable Bedrest is completed, the patient has ambulated without difficulty Patient has no complaints Activity restrictions and site care were discussed with the patient VSS, telemetry is SR F/u has been arranged.  Tommye Standard, PA-C  Allegra Lai, MD

## 2016-04-03 NOTE — Discharge Instructions (Signed)
No driving for 1 week. No lifting over 5 lbs for 1 week. No vigorous or sexual activity for 1 week. You may return to work on 04/10/16. Keep procedure site clean & dry. If you notice increased pain, swelling, bleeding or pus, call/return!  You may shower, but no soaking baths/hot tubs/pools for 1 week.

## 2016-04-03 NOTE — Transfer of Care (Signed)
Immediate Anesthesia Transfer of Care Note  Patient: Linda Mcgrath  Procedure(s) Performed: Procedure(s): A-Flutter Ablation (N/A)  Patient Location: PACU  Anesthesia Type:MAC  Level of Consciousness: awake, alert , oriented and patient cooperative  Airway & Oxygen Therapy: Patient Spontanous Breathing and Patient connected to nasal cannula oxygen  Post-op Assessment: Report given to RN, Post -op Vital signs reviewed and stable and Patient moving all extremities  Post vital signs: Reviewed and stable  Last Vitals:  Vitals:   04/03/16 0635  BP: 127/64  Pulse: (!) 51  Resp: 18  Temp: 36.4 C    Last Pain:  Vitals:   04/03/16 0635  TempSrc: Oral         Complications: No apparent anesthesia complications

## 2016-04-03 NOTE — Progress Notes (Signed)
Site area: rt groin sheath pull Site Prior to Removal:  Level 0 Pressure Applied For:  15 minutes Manual:   yes Patient Status During Pull:  stable Post Pull Site:  Level  0 Post Pull Instructions Given:  yes Post Pull Pulses Present: rt dp 3+ Dressing Applied:  Small tegaderm Bedrest begins @ 1040 Comments:  IV saline locked

## 2016-04-03 NOTE — H&P (Signed)
Linda Mcgrath has a history of atrial flutter requiring cardioversion.  She presents to the hospital today for ablation of her atrial flutter.  She has been compliant with her anticoagulation.  On exam, regular rhythm, no murmurs, lungs clear.  Risks and benefits of the procedure were explained.  Risks include but are not limited to bleeding, tamponade, heart block, and stroke.  She understands these risks and has agreed to the procedure.  [name], MD 04/03/2016 7:52 AM

## 2016-04-03 NOTE — Anesthesia Postprocedure Evaluation (Signed)
Anesthesia Post Note  Patient: Linda Mcgrath  Procedure(s) Performed: Procedure(s) (LRB): A-Flutter Ablation (N/A)  Patient location during evaluation: PACU Anesthesia Type: MAC Level of consciousness: awake and alert Pain management: pain level controlled Vital Signs Assessment: post-procedure vital signs reviewed and stable Respiratory status: spontaneous breathing, nonlabored ventilation, respiratory function stable and patient connected to nasal cannula oxygen Cardiovascular status: stable and blood pressure returned to baseline Anesthetic complications: no    Last Vitals:  Vitals:   04/03/16 1050 04/03/16 1110  BP: (!) 100/56   Pulse: (!) 53   Resp: 17   Temp:  36.5 C    Last Pain:  Vitals:   04/03/16 1110  TempSrc: Oral                 Mylah Baynes DAVID

## 2016-04-06 ENCOUNTER — Other Ambulatory Visit: Payer: Self-pay | Admitting: Family Medicine

## 2016-04-10 ENCOUNTER — Other Ambulatory Visit: Payer: Self-pay | Admitting: Family Medicine

## 2016-04-10 DIAGNOSIS — Z5181 Encounter for therapeutic drug level monitoring: Secondary | ICD-10-CM | POA: Diagnosis not present

## 2016-04-10 DIAGNOSIS — Z880 Allergy status to penicillin: Secondary | ICD-10-CM | POA: Diagnosis not present

## 2016-04-10 DIAGNOSIS — Z882 Allergy status to sulfonamides status: Secondary | ICD-10-CM | POA: Diagnosis not present

## 2016-04-10 DIAGNOSIS — Z885 Allergy status to narcotic agent status: Secondary | ICD-10-CM | POA: Diagnosis not present

## 2016-04-10 DIAGNOSIS — Z881 Allergy status to other antibiotic agents status: Secondary | ICD-10-CM | POA: Diagnosis not present

## 2016-04-10 DIAGNOSIS — L438 Other lichen planus: Secondary | ICD-10-CM | POA: Diagnosis not present

## 2016-04-10 DIAGNOSIS — Z79899 Other long term (current) drug therapy: Secondary | ICD-10-CM | POA: Diagnosis not present

## 2016-04-11 DIAGNOSIS — Z8679 Personal history of other diseases of the circulatory system: Secondary | ICD-10-CM | POA: Diagnosis not present

## 2016-04-11 DIAGNOSIS — I4892 Unspecified atrial flutter: Secondary | ICD-10-CM | POA: Diagnosis not present

## 2016-04-11 DIAGNOSIS — Z9889 Other specified postprocedural states: Secondary | ICD-10-CM | POA: Diagnosis not present

## 2016-04-15 DIAGNOSIS — N393 Stress incontinence (female) (male): Secondary | ICD-10-CM | POA: Diagnosis not present

## 2016-04-15 DIAGNOSIS — N3941 Urge incontinence: Secondary | ICD-10-CM | POA: Diagnosis not present

## 2016-04-16 DIAGNOSIS — H401122 Primary open-angle glaucoma, left eye, moderate stage: Secondary | ICD-10-CM | POA: Diagnosis not present

## 2016-04-16 DIAGNOSIS — H401111 Primary open-angle glaucoma, right eye, mild stage: Secondary | ICD-10-CM | POA: Diagnosis not present

## 2016-04-30 NOTE — Progress Notes (Signed)
Electrophysiology Office Note   Date:  05/01/2016   ID:  Linda Mcgrath, DOB [birth year], MRN [id number]  PCP:  Roxy Manns, MD  Cardiologist:  Jacinto Halim Primary Electrophysiologist:  Orlene Salmons [name], MD    Chief Complaint  Patient presents with  . Follow-up    post ablation     History of Present Illness: Linda Mcgrath is a 82 y.o. female who presents today for electrophysiology evaluation.   PMHx of hypothyroidism, HLD, OA, fibromyalgia was referred to the ED by her PMD for tachycardia.  Today, she denies symptoms of palpitations, chest pain, shortness of breath, orthopnea, PND, lower extremity edema, claudication, dizziness, presyncope, syncope, bleeding, or neurologic sequela. The patient is tolerating medications without difficulties and is otherwise without complaint today.  Had ablation for atrial flutter 04/03/16. Since that time, she has felt well. She did stop her cardiac medications 2 weeks after the ablation, including her anticoagulation.   Past Medical History:  Diagnosis Date  . Allergic rhinitis   . Cataract    Bil/lens implant  . Colon polyp 1999   small polyp  . Diverticulosis   . Fibromyalgia   . Hyperlipidemia   . Hypertension   . Hypothyroidism   . Internal hemorrhoids   . Leg cramps   . Lung nodule    stable LUL 9 mm  . Menopausal syndrome   . Osteoarthritis   . UTI (urinary tract infection)    Past Surgical History:  Procedure Laterality Date  . Abd U/S  11/1998   negative  . Abd U/S  01/2001   gallbladder polyps  . ABDOMINAL HYSTERECTOMY  1975   total-fibroids  . APPENDECTOMY  1975  . BREAST BIOPSY     benign  . CARDIOVERSION N/A 03/07/2016   Procedure: CARDIOVERSION;  Surgeon: Yates Decamp, MD;  Location: [facility] ENDOSCOPY;  Service: Cardiovascular;  Laterality: N/A;  . CATARACT EXTRACTION     bilateral  . CHOLECYSTECTOMY    . COLONOSCOPY  1998   Diverticulosis; polyp\  . COLONOSCOPY  09/2002   Diverticulosis, hem  .  COLONOSCOPY  12/2007   diverticulosis, polyp  . DEXA  10/2001   osteopenia  . ELECTROPHYSIOLOGIC STUDY N/A 04/03/2016   Procedure: A-Flutter Ablation;  Surgeon: Jdyn Parkerson [name], MD;  Location: [facility] INVASIVE CV LAB;  Service: Cardiovascular;  Laterality: N/A;  . ESOPHAGOGASTRODUODENOSCOPY  2004  . Hida scan  01/2001   Negative  . KNEE SURGERY     right knee / 11/2004  . laser surgery for glaucoma Left Sep 21, 2014  . ROTATOR CUFF REPAIR     x 2 /right shoulder  . SKIN CANCER EXCISION     pre-melanoma / on face  . TEE WITHOUT CARDIOVERSION N/A 03/07/2016   Procedure: TRANSESOPHAGEAL ECHOCARDIOGRAM (TEE);  Surgeon: Yates Decamp, MD;  Location: [facility] ENDOSCOPY;  Service: Cardiovascular;  Laterality: N/A;     Current Outpatient Prescriptions  Medication Sig Dispense Refill  . Ascorbic Acid (VITAMIN C) 1000 MG tablet Take 1,000 mg by mouth daily.      Marland Kitchen aspirin 81 MG tablet Take 81 mg by mouth daily.    . B Complex Vitamins (VITAMIN B COMPLEX PO) Take 1 tablet by mouth daily.    . Biotin 10 MG TABS Take 1 tablet by mouth every morning.     . Calcium Carbonate-Vit D-Min 600-400 MG-UNIT TABS Take 1 tablet by mouth every morning.     . chlorhexidine (PERIDEX) 0.12 % solution Use as directed 5-15 mLs in  the mouth or throat daily as needed (for breakouts). Reported on 03/06/2016    . Cranberry 500 MG CAPS Take 1 capsule by mouth daily.     . cyclobenzaprine (FLEXERIL) 10 MG tablet Take 1 tablet (10 mg total) by mouth at bedtime as needed. 90 tablet 0  . estradiol (ESTRACE) 1 MG tablet Take 1 tablet (1 mg total) by mouth daily. 90 tablet 3  . fluticasone (FLONASE) 50 MCG/ACT nasal spray Place 1 spray into both nostrils daily as needed for allergies or rhinitis.    . folic acid (FOLVITE) 1 MG tablet Take 1 mg by mouth daily.    Marland Kitchen ibuprofen (ADVIL,MOTRIN) 200 MG tablet Take 400 mg by mouth at bedtime. For leg pain/discomfort    . levothyroxine (SYNTHROID, LEVOTHROID) 88 MCG tablet TAKE ONE TABLET BY MOUTH  EVERY MORNING BEFORE BREAKFAST 90 tablet 1  . Lutein 20 MG CAPS Take 1 capsule by mouth daily.     . methotrexate (RHEUMATREX) 2.5 MG tablet Take 7.5 mg by mouth every 7 (seven) days. [facility])    . montelukast (SINGULAIR) 10 MG tablet Take 1 tablet (10 mg total) by mouth at bedtime. 90 tablet 3  . Multiple Vitamin (MULTIVITAMIN) capsule Take 1 capsule by mouth daily.      . Pumpkin Seed-Soy Germ (AZO BLADDER CONTROL/GO-LESS PO) Take 310 mg by mouth 3 (three) times daily.    . TURMERIC PO Take 1 capsule by mouth daily.    . vitamin B-12 (CYANOCOBALAMIN) 500 MCG tablet Take 500 mcg by mouth daily.     No current facility-administered medications for this visit.     Allergies:   Shellfish allergy; Tetracycline; Amlodipine besylate; Ciprofloxacin; Codeine; Propoxyphene hcl; Sulfamethoxazole-trimethoprim; Tape; Amoxicillin; Other; and Penicillins   Social History:  The patient  reports that she has never smoked. She has never used smokeless tobacco. She reports that she does not drink alcohol or use drugs.   Family History:  The patient's family history includes Allergies in her mother; Colon cancer in her brother; Heart disease in her mother; Kidney failure in her son; Parkinsonism in her father.    ROS:  Please see the history of present illness.   Otherwise, review of systems is positive for easy bruising.   All other systems are reviewed and negative.    PHYSICAL EXAM: VS:  BP 130/84   Pulse 94   Ht 5\' 5"  (1.651 m)   Wt 186 lb 6.4 oz (84.6 kg)   LMP 08/26/1969   BMI 31.02 kg/m  , BMI Body mass index is 31.02 kg/m. GEN: Well nourished, well developed, in no acute distress  HEENT: normal  Neck: no JVD, carotid bruits, or masses Cardiac: RRR; no murmurs, rubs, or gallops,no edema  Respiratory:  clear to auscultation bilaterally, normal work of breathing GI: soft, nontender, nondistended, + BS MS: no deformity or atrophy  Skin: warm and dry Neuro:  Strength and sensation are  intact Psych: euthymic mood, full affect  EKG:  EKG is ordered today. Personal review of the ECG ordered shows sinus rhythm, rate 94  Recent Labs: 11/01/2015: TSH 3.13 03/17/2016: ALT 29; B Natriuretic Peptide 631.6; Magnesium 2.0 03/29/2016: BUN 15; Creatinine, Ser 1.02; Hemoglobin 14.0; Platelets 366; Potassium 4.0; Sodium 140    Lipid Panel     Component Value Date/Time   CHOL 200 11/01/2015 1152   TRIG 107.0 11/01/2015 1152   HDL 72.60 11/01/2015 1152   CHOLHDL 3 11/01/2015 1152   VLDL 21.4 11/01/2015 1152  LDLCALC 106 (H) 11/01/2015 1152   LDLDIRECT 125.7 06/09/2013 0811     Wt Readings from Last 3 Encounters:  05/01/16 186 lb 6.4 oz (84.6 kg)  04/03/16 180 lb (81.6 kg)  03/29/16 194 lb (88 kg)      Other studies Reviewed: Additional studies/ records that were reviewed today include: TEE 03/07/16  Review of the above records today demonstrates:  - Left ventricle: Systolic function was normal. Wall motion was  normal; there were no regional wall motion abnormalities. - Left atrium: No evidence of thrombus in the atrial cavity or  appendage. No evidence of thrombus in the atrial cavity or  appendage. No evidence of thrombus in the appendage. - Right atrium: No evidence of thrombus in the atrial cavity or  appendage.   ASSESSMENT AND PLAN:  1.  Typical appearing atrial flutter: Ablation 04/03/16. She has stopped her anticoagulation on her own. He does have a high stroke risk, but fortunately she did not have any issues. As she is in sinus rhythm today, Nevaeh Casillas not restart her anticoagulation, as she says that she does not wish to have it restarted. I Rukia Mcgillivray follow up with her on an as-needed basis.  This patients CHA2DS2-VASc Score and unadjusted Ischemic Stroke Rate (% per year) is equal to 3.2 % stroke rate/year from a score of 3  Above score calculated as 1 point each if present [CHF, HTN, DM, Vascular=MI/PAD/Aortic Plaque, Age if 65-74, or Female] Above score  calculated as 2 points each if present [Age > 75, or Stroke/TIA/TE]  Current medicines are reviewed at length with the patient today.   The patient does not have concerns regarding her medicines.  The following changes were made today:  none  Labs/ tests ordered today include:  Orders Placed This Encounter  Procedures  . EKG 12-Lead     Disposition:   FU with Rhandi Despain PRN  Signed, Giovanny Dugal [name], MD  05/01/2016 11:19 AM     [facility] [address] [place] [address] [phone] (office) [phone] (fax)

## 2016-05-01 ENCOUNTER — Ambulatory Visit (INDEPENDENT_AMBULATORY_CARE_PROVIDER_SITE_OTHER): Payer: Medicare Other | Admitting: Cardiology

## 2016-05-01 ENCOUNTER — Encounter: Payer: Self-pay | Admitting: Cardiology

## 2016-05-01 VITALS — BP 130/84 | HR 94 | Ht 65.0 in | Wt 186.4 lb

## 2016-05-01 DIAGNOSIS — I483 Typical atrial flutter: Secondary | ICD-10-CM | POA: Diagnosis not present

## 2016-05-01 DIAGNOSIS — Z9889 Other specified postprocedural states: Secondary | ICD-10-CM | POA: Diagnosis not present

## 2016-05-01 DIAGNOSIS — Z8679 Personal history of other diseases of the circulatory system: Secondary | ICD-10-CM

## 2016-05-01 NOTE — Patient Instructions (Signed)
Medication Instructions:  Your physician recommends that you continue on your current medications as directed. Please refer to the Current Medication list given to you today.  * If you need a refill on your cardiac medications before your next appointment, please call your pharmacy.   Labwork: None ordered  Testing/Procedures: None ordered  Follow-Up: No follow up is needed at this time with Dr. Camnitz.  He will see you on an as needed basis.   Thank you for choosing [facility]!!   Kemo Spruce, RN [phone]     

## 2016-05-20 DIAGNOSIS — Z23 Encounter for immunization: Secondary | ICD-10-CM | POA: Diagnosis not present

## 2016-06-25 ENCOUNTER — Ambulatory Visit (INDEPENDENT_AMBULATORY_CARE_PROVIDER_SITE_OTHER): Payer: Medicare Other | Admitting: Family Medicine

## 2016-06-25 ENCOUNTER — Encounter: Payer: Self-pay | Admitting: Family Medicine

## 2016-06-25 VITALS — BP 110/66 | HR 90 | Temp 97.4°F | Ht 65.0 in | Wt 185.8 lb

## 2016-06-25 DIAGNOSIS — I483 Typical atrial flutter: Secondary | ICD-10-CM

## 2016-06-25 DIAGNOSIS — I10 Essential (primary) hypertension: Secondary | ICD-10-CM

## 2016-06-25 NOTE — Assessment & Plan Note (Signed)
bp in fair control at this time  BP Readings from Last 1 Encounters:  06/25/16 110/66   No changes needed Disc lifstyle change with low sodium diet and exercise  Labs reviewed

## 2016-06-25 NOTE — Patient Instructions (Addendum)
I'm so glad you are feeling better  Pulse is about 90 -keep an eye on it  Keep up a good fluid intake  Eat a healthy diet  Exercise as tolerated  Avoid crowds this cold and flu season as much as possible and keep washing hands to prevent infection  You are up to date on immunizations

## 2016-06-25 NOTE — Progress Notes (Signed)
Subjective:    Patient ID: Linda Mcgrath, female    DOB: 18-Jun-1933, 82 y.o.   MRN: [id number]  HPI Here for f/u from tx for a flutter   Had an ablation 04/03/16 Then stopped her cardiology meds and anticoag med on her own (she could not tolerate the blood thinner) -side effects - to xarelto  Also on metoprolol and did not feel well  TEE last was normal  Had a good visit with Dr Gerre Pebbles on 05/01/16  Was told that the problem with her heart was a bacterial "germ"  Started last spring after bronchitis and sinusitis    Wt Readings from Last 3 Encounters:  06/25/16 185 lb 12 oz (84.3 kg)  05/01/16 186 lb 6.4 oz (84.6 kg)  04/03/16 180 lb (81.6 kg)  she is back to exercise  Usual weight is 182 to 185 No chf symptoms  Is watching her portions carefully and avoiding sweets and carbs  bmi is 30.9  Lab Results  Component Value Date   HGBA1C 5.3 11/01/2015       Chemistry      Component Value Date/Time   NA 140 03/29/2016 0800   K 4.0 03/29/2016 0800   CL 106 03/29/2016 0800   CO2 27 03/29/2016 0800   BUN 15 03/29/2016 0800   CREATININE 1.02 (H) 03/29/2016 0800   CREATININE 1.00 (H) 03/13/2016 1119      Component Value Date/Time   CALCIUM 9.0 03/29/2016 0800   ALKPHOS 80 03/17/2016 1109   AST 24 03/17/2016 1109   ALT 29 03/17/2016 1109   BILITOT 1.2 03/17/2016 1109     Lab Results  Component Value Date   WBC 7.7 03/29/2016   HGB 14.0 03/29/2016   HCT 42.9 03/29/2016   MCV 98.4 03/29/2016   PLT 366 03/29/2016    Lab Results  Component Value Date   TSH 3.13 11/01/2015     BP Readings from Last 3 Encounters:  06/25/16 110/66  05/01/16 130/84  04/03/16 (!) 100/56    Pulse Readings from Last 3 Encounters:  06/25/16 (!) 102  05/01/16 94  04/03/16 (!) 53   she checks her pulse at home 78-84 usually  She was a little nervous today  No longer on a rate controlling med    Is up to date on all imms  Patient Active Problem List   Diagnosis Date Noted    . Atrial flutter (HCC) 03/06/2016  . Severe headache 03/04/2016  . Obesity 11/06/2015  . Urge incontinence 01/24/2015  . Estrogen deficiency 10/25/2014  . Lichen planus 12/20/2013  . Encounter for Medicare annual wellness exam 06/17/2013  . Aphthous ulcer of mouth 03/16/2013  . Hyperglycemia 06/03/2011  . HYPERTENSION, BENIGN ESSENTIAL 10/23/2010  . CONSTIPATION 10/23/2010  . HEMATOCHEZIA 10/23/2010  . OSTEOARTHRITIS, HANDS, BILATERAL 01/24/2010  . Hypothyroidism 08/24/2008  . Vitamin D deficiency 06/06/2008  . Hyperlipidemia 06/06/2008  . MENOPAUSAL SYNDROME 03/29/2008  . Osteopenia 03/29/2008  . ALLERGIC RHINITIS 01/25/2008  . IBS 01/25/2008  . OSTEOARTHRITIS 01/25/2008  . FIBROMYALGIA 01/25/2008   Past Medical History:  Diagnosis Date  . Allergic rhinitis   . Cataract    Bil/lens implant  . Colon polyp 1999   small polyp  . Diverticulosis   . Fibromyalgia   . Hyperlipidemia   . Hypertension   . Hypothyroidism   . Internal hemorrhoids   . Leg cramps   . Lung nodule    stable LUL 9 mm  . Menopausal syndrome   .  Osteoarthritis   . UTI (urinary tract infection)    Past Surgical History:  Procedure Laterality Date  . Abd U/S  11/1998   negative  . Abd U/S  01/2001   gallbladder polyps  . ABDOMINAL HYSTERECTOMY  1975   total-fibroids  . APPENDECTOMY  1975  . BREAST BIOPSY     benign  . CARDIOVERSION N/A 03/07/2016   Procedure: CARDIOVERSION;  Surgeon: Yates Decamp, MD;  Location: [facility] ENDOSCOPY;  Service: Cardiovascular;  Laterality: N/A;  . CATARACT EXTRACTION     bilateral  . CHOLECYSTECTOMY    . COLONOSCOPY  1998   Diverticulosis; polyp\  . COLONOSCOPY  09/2002   Diverticulosis, hem  . COLONOSCOPY  12/2007   diverticulosis, polyp  . DEXA  10/2001   osteopenia  . ELECTROPHYSIOLOGIC STUDY N/A 04/03/2016   Procedure: A-Flutter Ablation;  Surgeon: Will [name], MD;  Location: [facility] INVASIVE CV LAB;  Service: Cardiovascular;  Laterality: N/A;  .  ESOPHAGOGASTRODUODENOSCOPY  2004  . Hida scan  01/2001   Negative  . KNEE SURGERY     right knee / 11/2004  . laser surgery for glaucoma Left Sep 21, 2014  . ROTATOR CUFF REPAIR     x 2 /right shoulder  . SKIN CANCER EXCISION     pre-melanoma / on face  . TEE WITHOUT CARDIOVERSION N/A 03/07/2016   Procedure: TRANSESOPHAGEAL ECHOCARDIOGRAM (TEE);  Surgeon: Yates Decamp, MD;  Location: [facility] ENDOSCOPY;  Service: Cardiovascular;  Laterality: N/A;   Social History  Substance Use Topics  . Smoking status: Never Smoker  . Smokeless tobacco: Never Used  . Alcohol use No     Comment: occasional-rare   Family History  Problem Relation Age of Onset  . Parkinsonism Father   . Colon cancer Brother   . Allergies Mother   . Heart disease Mother   . Kidney failure Son     congenital   Allergies  Allergen Reactions  . Shellfish Allergy Anaphylaxis and Nausea And Vomiting  . Tetracycline Hives, Nausea And Vomiting, Rash and Other (See Comments)    SYSTEMIC REACTION: heart palpitations,muscle spasms, vomiting  . Amlodipine Besylate     REACTION: muscle spasms, extremity swelling, low bp  . Ciprofloxacin     REACTION: muscle spasms, insomnia  . Codeine     REACTION: hallucinations  . Propoxyphene Hcl   . Sulfamethoxazole-Trimethoprim Nausea Only and Other (See Comments)    REACTION: dizzy, could not focus eyes and could not concentrate and nausea  . Tape Other (See Comments)    BANDAIDS TAKE OFF HER SKIN; PLEASE USE COBAN OR PAPER   . Xarelto [Rivaroxaban]     Aches and pains and nervousness  . Amoxicillin Rash  . 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 Penicillins Swelling and Rash    Has patient had a PCN reaction causing immediate rash, facial/tongue/throat swelling, SOB or lightheadedness with hypotension: Yes Has  patient had a PCN reaction causing severe rash involving mucus membranes or skin necrosis: No Has patient had a PCN reaction that required hospitalization No Has patient had a PCN reaction occurring within the last 10 years: No If all of the above answers are "NO", then may proceed with Cephalosporin use.    Current Outpatient Prescriptions on File Prior to Visit  Medication Sig Dispense Refill  .  Ascorbic Acid (VITAMIN C) 1000 MG tablet Take 1,000 mg by mouth daily.      Marland Kitchen aspirin 81 MG tablet Take 81 mg by mouth daily.    . B Complex Vitamins (VITAMIN B COMPLEX PO) Take 1 tablet by mouth daily.    . Biotin 10 MG TABS Take 1 tablet by mouth every morning.     . Calcium Carbonate-Vit D-Min 600-400 MG-UNIT TABS Take 1 tablet by mouth every morning.     . chlorhexidine (PERIDEX) 0.12 % solution Use as directed 5-15 mLs in the mouth or throat daily as needed (for breakouts). Reported on 03/06/2016    . Cranberry 500 MG CAPS Take 1 capsule by mouth daily.     . cyclobenzaprine (FLEXERIL) 10 MG tablet Take 1 tablet (10 mg total) by mouth at bedtime as needed. 90 tablet 0  . estradiol (ESTRACE) 1 MG tablet Take 1 tablet (1 mg total) by mouth daily. 90 tablet 3  . fluticasone (FLONASE) 50 MCG/ACT nasal spray Place 1 spray into both nostrils daily as needed for allergies or rhinitis.    . folic acid (FOLVITE) 1 MG tablet Take 1 mg by mouth daily.    Marland Kitchen ibuprofen (ADVIL,MOTRIN) 200 MG tablet Take 400 mg by mouth at bedtime. For leg pain/discomfort    . levothyroxine (SYNTHROID, LEVOTHROID) 88 MCG tablet TAKE ONE TABLET BY MOUTH EVERY MORNING BEFORE BREAKFAST 90 tablet 1  . Lutein 20 MG CAPS Take 1 capsule by mouth daily.     . methotrexate (RHEUMATREX) 2.5 MG tablet Take 7.5 mg by mouth every 7 (seven) days. [facility])    . montelukast (SINGULAIR) 10 MG tablet Take 1 tablet (10 mg total) by mouth at bedtime. 90 tablet 3  . Multiple Vitamin (MULTIVITAMIN) capsule Take 1 capsule by mouth daily.      .  Pumpkin Seed-Soy Germ (AZO BLADDER CONTROL/GO-LESS PO) Take 310 mg by mouth 3 (three) times daily.    . TURMERIC PO Take 1 capsule by mouth 2 (two) times daily.     . vitamin B-12 (CYANOCOBALAMIN) 500 MCG tablet Take 500 mcg by mouth daily.     No current facility-administered medications on file prior to visit.     Review of Systems Review of Systems  Constitutional: Negative for fever, appetite change, fatigue and unexpected weight change.  Eyes: Negative for pain and visual disturbance.  Respiratory: Negative for cough and shortness of breath.   Cardiovascular: Negative for cp or palpitations    Gastrointestinal: Negative for nausea, diarrhea and constipation.  Genitourinary: Negative for urgency and frequency.  Skin: Negative for pallor or rash   Neurological: Negative for weakness, light-headedness, numbness and headaches.  Hematological: Negative for adenopathy. Does not bruise/bleed easily.  Psychiatric/Behavioral: Negative for dysphoric mood. The patient is not nervous/anxious.         Objective:   Physical Exam  Constitutional: She appears well-developed and well-nourished. No distress.  obese and well appearing   HENT:  Head: Normocephalic and atraumatic.  Mouth/Throat: Oropharynx is clear and moist.  Eyes: Conjunctivae and EOM are normal. Pupils are equal, round, and reactive to light.  Neck: Normal range of motion. Neck supple. No JVD present. Carotid bruit is not present. No thyromegaly present.  Cardiovascular: Regular rhythm, normal heart sounds and intact distal pulses.  Exam reveals no gallop.   Rate of 90 on re check  Reg rhythm   Pulmonary/Chest: Effort normal and breath sounds normal. No respiratory distress. She has no wheezes. She has no  rales.  No crackles  Abdominal: Soft. Bowel sounds are normal. She exhibits no distension, no abdominal bruit and no mass. There is no tenderness.  Musculoskeletal: She exhibits no edema.  Lymphadenopathy:    She has no  cervical adenopathy.  Neurological: She is alert. She has normal reflexes.  Skin: Skin is warm and dry. No rash noted. No pallor.  Psychiatric: She has a normal mood and affect.          Assessment & Plan:   Problem List Items Addressed This Visit      Cardiovascular and Mediastinum   Atrial flutter (HCC) - Primary    Nl rhythm today s/p ablation and no longer on blood thinner or beta blocker  Pulse came down to 90 in office She states at home is is in the normal range and she continues to watch it  cardiol f/u prn as planned  Pt mentions this may have been caused by infection (had bronchitis and sinusitis at the time)-but I do not see evidence in the chart of any inf valve or myocarditis or pericarditis  ? Perhaps viral  Did rev imms and ways to stay healthy this cold and flu season       HYPERTENSION, BENIGN ESSENTIAL    bp in fair control at this time  BP Readings from Last 1 Encounters:  06/25/16 110/66   No changes needed Disc lifstyle change with low sodium diet and exercise  Labs reviewed        Other Visit Diagnoses   None.

## 2016-06-25 NOTE — Assessment & Plan Note (Addendum)
Nl rhythm today s/p ablation and no longer on blood thinner or beta blocker  Pulse came down to 90 in office She states at home is is in the normal range and she continues to watch it  cardiol f/u prn as planned  Pt mentions this may have been caused by infection (had bronchitis and sinusitis at the time)-but I do not see evidence in the chart of any inf valve or myocarditis or pericarditis  ? Perhaps viral  Did rev imms and ways to stay healthy this cold and flu season

## 2016-06-25 NOTE — Assessment & Plan Note (Signed)
Discussed how this problem influences overall health and the risks it imposes  Reviewed plan for weight loss with lower calorie diet (via better food choices and also portion control or program like weight watchers) and exercise building up to or more than 30 minutes 5 days per week including some aerobic activity   Enc exercise -inc gradually as tolerated since cleared by cardiology

## 2016-06-25 NOTE — Progress Notes (Signed)
Pre visit review using our clinic review tool, if applicable. No additional management support is needed unless otherwise documented below in the visit note. 

## 2016-07-11 DIAGNOSIS — I4892 Unspecified atrial flutter: Secondary | ICD-10-CM | POA: Diagnosis not present

## 2016-07-11 DIAGNOSIS — Z8679 Personal history of other diseases of the circulatory system: Secondary | ICD-10-CM | POA: Diagnosis not present

## 2016-07-11 DIAGNOSIS — Z9889 Other specified postprocedural states: Secondary | ICD-10-CM | POA: Diagnosis not present

## 2016-08-07 ENCOUNTER — Ambulatory Visit (INDEPENDENT_AMBULATORY_CARE_PROVIDER_SITE_OTHER): Payer: Medicare Other | Admitting: Family Medicine

## 2016-08-07 ENCOUNTER — Encounter: Payer: Self-pay | Admitting: Family Medicine

## 2016-08-07 VITALS — BP 128/72 | HR 75 | Temp 97.4°F | Ht 65.0 in | Wt 181.8 lb

## 2016-08-07 DIAGNOSIS — L0231 Cutaneous abscess of buttock: Secondary | ICD-10-CM

## 2016-08-07 DIAGNOSIS — L03317 Cellulitis of buttock: Secondary | ICD-10-CM | POA: Diagnosis not present

## 2016-08-07 MED ORDER — AZITHROMYCIN 250 MG PO TABS: ORAL_TABLET | ORAL | 0 refills | Status: DC

## 2016-08-07 NOTE — Progress Notes (Signed)
Pre visit review using our clinic review tool, if applicable. No additional management support is needed unless otherwise documented below in the visit note. 

## 2016-08-07 NOTE — Patient Instructions (Addendum)
Keep the abscess area clean with soap and water  Warm compress whenever you can  If it drains more -that is fine Take the azithromycin as directed If worse/ bigger / more painful or a fever - alert [place] immediately   Keep [place] updated - if not improving in 2-3 days let us know  If worse at any time let us know

## 2016-08-07 NOTE — Progress Notes (Signed)
Subjective:    Patient ID: Linda Mcgrath, female    DOB: [birth year], 82 y.o.   MRN: [id number]  HPI  Here to check a lump on her R hip/buttock area  Size of a golfball- painful/ during the night last night - it burst and drained pus  Still sore but less so now  No fever  Feels ok   Wears a pad  Washed with liquid baby soap and water and patted dry  Also used a clear ointment - barrier?   Had a little pain on her abd- low R side , gone now  Has had a total hysterectomy   Patient Active Problem List   Diagnosis Date Noted  . Abscess and cellulitis of gluteal region 08/07/2016  . Atrial flutter (HCC) 03/06/2016  . Severe headache 03/04/2016  . Obesity 11/06/2015  . Urge incontinence 01/24/2015  . Estrogen deficiency 10/25/2014  . Lichen planus 12/20/2013  . Encounter for Medicare annual wellness exam 06/17/2013  . Aphthous ulcer of mouth 03/16/2013  . Hyperglycemia 06/03/2011  . HYPERTENSION, BENIGN ESSENTIAL 10/23/2010  . CONSTIPATION 10/23/2010  . HEMATOCHEZIA 10/23/2010  . OSTEOARTHRITIS, HANDS, BILATERAL 01/24/2010  . Hypothyroidism 08/24/2008  . Vitamin D deficiency 06/06/2008  . Hyperlipidemia 06/06/2008  . MENOPAUSAL SYNDROME 03/29/2008  . Osteopenia 03/29/2008  . ALLERGIC RHINITIS 01/25/2008  . IBS 01/25/2008  . OSTEOARTHRITIS 01/25/2008  . FIBROMYALGIA 01/25/2008   Past Medical History:  Diagnosis Date  . Allergic rhinitis   . Cataract    Bil/lens implant  . Colon polyp 1999   small polyp  . Diverticulosis   . Fibromyalgia   . Hyperlipidemia   . Hypertension   . Hypothyroidism   . Internal hemorrhoids   . Leg cramps   . Lung nodule    stable LUL 9 mm  . Menopausal syndrome   . Osteoarthritis   . UTI (urinary tract infection)    Past Surgical History:  Procedure Laterality Date  . Abd U/S  11/1998   negative  . Abd U/S  01/2001   gallbladder polyps  . ABDOMINAL HYSTERECTOMY  1975   total-fibroids  . APPENDECTOMY  1975  . BREAST  BIOPSY     benign  . CARDIOVERSION N/A 03/07/2016   Procedure: CARDIOVERSION;  Surgeon: Yates Decamp, MD;  Location: [facility] ENDOSCOPY;  Service: Cardiovascular;  Laterality: N/A;  . CATARACT EXTRACTION     bilateral  . CHOLECYSTECTOMY    . COLONOSCOPY  1998   Diverticulosis; polyp\  . COLONOSCOPY  09/2002   Diverticulosis, hem  . COLONOSCOPY  12/2007   diverticulosis, polyp  . DEXA  10/2001   osteopenia  . ELECTROPHYSIOLOGIC STUDY N/A 04/03/2016   Procedure: A-Flutter Ablation;  Surgeon: Will [name], MD;  Location: [facility] INVASIVE CV LAB;  Service: Cardiovascular;  Laterality: N/A;  . ESOPHAGOGASTRODUODENOSCOPY  2004  . Hida scan  01/2001   Negative  . KNEE SURGERY     right knee / 11/2004  . laser surgery for glaucoma Left Sep 21, 2014  . ROTATOR CUFF REPAIR     x 2 /right shoulder  . SKIN CANCER EXCISION     pre-melanoma / on face  . TEE WITHOUT CARDIOVERSION N/A 03/07/2016   Procedure: TRANSESOPHAGEAL ECHOCARDIOGRAM (TEE);  Surgeon: Yates Decamp, MD;  Location: [facility] ENDOSCOPY;  Service: Cardiovascular;  Laterality: N/A;   Social History  Substance Use Topics  . Smoking status: Never Smoker  . Smokeless tobacco: Never Used  . Alcohol use No  Comment: occasional-rare   Family History  Problem Relation Age of Onset  . Parkinsonism Father   . Colon cancer Brother   . Allergies Mother   . Heart disease Mother   . Kidney failure Son     congenital   Allergies  Allergen Reactions  . Shellfish Allergy Anaphylaxis and Nausea And Vomiting  . Tetracycline Hives, Nausea And Vomiting, Rash and Other (See Comments)    SYSTEMIC REACTION: heart palpitations,muscle spasms, vomiting  . Amlodipine Besylate     REACTION: muscle spasms, extremity swelling, low bp  . Ciprofloxacin     REACTION: muscle spasms, insomnia  . Codeine     REACTION: hallucinations  . Propoxyphene Hcl   . Sulfamethoxazole-Trimethoprim Nausea Only and Other (See Comments)    REACTION: dizzy, could not focus eyes  and could not concentrate and nausea  . Tape Other (See Comments)    BANDAIDS TAKE OFF HER SKIN; PLEASE USE COBAN OR PAPER   . Xarelto [Rivaroxaban]     Aches and pains and nervousness  . Amoxicillin Rash  . 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 Penicillins Swelling and Rash    Has patient had a PCN reaction causing immediate rash, facial/tongue/throat swelling, SOB or lightheadedness with hypotension: Yes Has patient had a PCN reaction causing severe rash involving mucus membranes or skin necrosis: No Has patient had a PCN reaction that required hospitalization No Has patient had a PCN reaction occurring within the last 10 years: No If all of the above answers are "NO", then may proceed with Cephalosporin use.    Current Outpatient Prescriptions on File Prior to Visit  Medication Sig Dispense Refill  . Ascorbic Acid (VITAMIN C) 1000 MG tablet Take 1,000 mg by mouth daily.      Marland Kitchen aspirin 81 MG tablet Take 81 mg by mouth daily.    . B Complex Vitamins (VITAMIN B COMPLEX PO) Take 1 tablet by mouth daily.    . Biotin 10 MG TABS Take 1 tablet by mouth every morning.     . Calcium Carbonate-Vit D-Min 600-400 MG-UNIT TABS Take 1 tablet by mouth every morning.     . chlorhexidine (PERIDEX) 0.12 % solution Use as directed 5-15 mLs in the mouth or throat daily as needed (for breakouts). Reported on 03/06/2016    . Cranberry 500 MG CAPS Take 1 capsule by mouth daily.     . cyclobenzaprine (FLEXERIL) 10 MG tablet Take 1 tablet (10 mg total) by mouth at bedtime as needed. 90 tablet 0  . estradiol (ESTRACE) 1 MG tablet Take 1 tablet (1 mg total) by mouth daily. 90 tablet 3  . fluticasone (FLONASE) 50 MCG/ACT nasal spray Place 1 spray into both nostrils daily as needed for allergies or rhinitis.    . folic acid (FOLVITE) 1 MG tablet  Take 1 mg by mouth daily.    Marland Kitchen ibuprofen (ADVIL,MOTRIN) 200 MG tablet Take 400 mg by mouth at bedtime. For leg pain/discomfort    . levothyroxine (SYNTHROID, LEVOTHROID) 88 MCG tablet TAKE ONE TABLET BY MOUTH EVERY MORNING BEFORE BREAKFAST 90 tablet 1  . Lutein 20 MG CAPS Take 1 capsule by mouth daily.     . methotrexate (RHEUMATREX) 2.5 MG tablet Take 7.5 mg by mouth every 7 (seven) days. [facility])    . montelukast (SINGULAIR)  10 MG tablet Take 1 tablet (10 mg total) by mouth at bedtime. 90 tablet 3  . Multiple Vitamin (MULTIVITAMIN) capsule Take 1 capsule by mouth daily.      . Pumpkin Seed-Soy Germ (AZO BLADDER CONTROL/GO-LESS PO) Take 310 mg by mouth 3 (three) times daily.    . TURMERIC PO Take 1 capsule by mouth 2 (two) times daily.     . vitamin B-12 (CYANOCOBALAMIN) 500 MCG tablet Take 500 mcg by mouth daily.     No current facility-administered medications on file prior to visit.     Review of Systems Review of Systems  Constitutional: Negative for fever, appetite change, fatigue and unexpected weight change.  Eyes: Negative for pain and visual disturbance.  Respiratory: Negative for cough and shortness of breath.   Cardiovascular: Negative for cp or palpitations    Gastrointestinal: Negative for nausea, diarrhea and constipation.  Genitourinary: Negative for urgency and frequency.  Skin: Negative for pallor or rash  pos for abscess on buttock that is painful Neurological: Negative for weakness, light-headedness, numbness and headaches.  Hematological: Negative for adenopathy. Does not bruise/bleed easily.  Psychiatric/Behavioral: Negative for dysphoric mood. The patient is not nervous/anxious.         Objective:   Physical Exam  Constitutional: She appears well-developed and well-nourished. No distress.  Well appearing   HENT:  Head: Normocephalic and atraumatic.  Cardiovascular: Normal rate and regular rhythm.   Abdominal: Soft. Bowel sounds are normal. She exhibits no  distension. There is no tenderness.  Skin: Skin is warm and dry.  1-2 cm firm abscess in R buttock cleft lateral to anus (not connecting) with scant erythema and scab in the middle No d/c noted and unable to express any  No bleeding slt tender  Very superficial  No streaks or swelling    Psychiatric: She has a normal mood and affect.  Cheerful and talkative           Assessment & Plan:   Problem List Items Addressed This Visit      Other   Abscess and cellulitis of gluteal region    R buttock- 1-2 cm diameter  Much improved since draining (per pt)-and no active drainage now to culture  Adv to keep clean with soap and water and use warm compresses as tolerated Azithromycin px - she is all to many abx classes Will update if not improved or worse-see AVS

## 2016-08-07 NOTE — Assessment & Plan Note (Signed)
R buttock- 1-2 cm diameter  Much improved since draining (per pt)-and no active drainage now to culture  Adv to keep clean with soap and water and use warm compresses as tolerated Azithromycin px - she is all to many abx classes Will update if not improved or worse-see AVS

## 2016-08-13 DIAGNOSIS — B370 Candidal stomatitis: Secondary | ICD-10-CM | POA: Diagnosis not present

## 2016-08-13 DIAGNOSIS — L438 Other lichen planus: Secondary | ICD-10-CM | POA: Diagnosis not present

## 2016-08-13 DIAGNOSIS — Z5181 Encounter for therapeutic drug level monitoring: Secondary | ICD-10-CM | POA: Diagnosis not present

## 2016-08-13 DIAGNOSIS — Z79899 Other long term (current) drug therapy: Secondary | ICD-10-CM | POA: Diagnosis not present

## 2016-08-15 ENCOUNTER — Ambulatory Visit (INDEPENDENT_AMBULATORY_CARE_PROVIDER_SITE_OTHER)
Admission: RE | Admit: 2016-08-15 | Discharge: 2016-08-15 | Disposition: A | Discharge status: Home / Self Care | Payer: Medicare Other | Source: Ambulatory Visit | Attending: Primary Care | Admitting: Primary Care

## 2016-08-15 ENCOUNTER — Encounter: Payer: Self-pay | Admitting: Primary Care

## 2016-08-15 ENCOUNTER — Ambulatory Visit (INDEPENDENT_AMBULATORY_CARE_PROVIDER_SITE_OTHER): Payer: Medicare Other | Admitting: Primary Care

## 2016-08-15 VITALS — BP 130/84 | HR 67 | Temp 97.6°F | Ht 65.0 in | Wt 181.4 lb

## 2016-08-15 DIAGNOSIS — R05 Cough: Secondary | ICD-10-CM | POA: Diagnosis not present

## 2016-08-15 DIAGNOSIS — R059 Cough, unspecified: Secondary | ICD-10-CM

## 2016-08-15 NOTE — Progress Notes (Signed)
Subjective:    Patient ID: Linda Mcgrath, female    DOB: 24-Oct-1932, 82 y.o.   MRN: [id number]  HPI  Linda Mcgrath is an 82 year old female with a history of allergic rhinitis who presents today with a chief complaint of cough. She also reports chest congestion, sore throat, nasal congestion. Her symptoms began three days ago. Her cough is productive with dark green sputum. She denies fevers, sick contacts, gluteal pain. She's taken Mucinex with some improvement as she's feeling better.  She was treated with Azithromycin for cellulitis and abscess to the right inner gluteal region last week for which she completed Saturday last weekend.   Review of Systems  Constitutional: Positive for fatigue. Negative for fever.  HENT: Positive for congestion and sinus pressure. Negative for postnasal drip.   Respiratory: Positive for cough. Negative for shortness of breath.   Cardiovascular: Negative for chest pain.       Past Medical History:  Diagnosis Date  . Allergic rhinitis   . Cataract    Bil/lens implant  . Colon polyp 1999   small polyp  . Diverticulosis   . Fibromyalgia   . Hyperlipidemia   . Hypertension   . Hypothyroidism   . Internal hemorrhoids   . Leg cramps   . Lung nodule    stable LUL 9 mm  . Menopausal syndrome   . Osteoarthritis   . UTI (urinary tract infection)      Social History   Social History  . Marital status: Married    Spouse name: N/A  . Number of children: 2  . Years of education: N/A   Occupational History  . TRAVEL AGENT [employer]   Social History Main Topics  . Smoking status: Never Smoker  . Smokeless tobacco: Never Used  . Alcohol use No     Comment: occasional-rare  . Drug use: No  . Sexual activity: No   Other Topics Concern  . Not on file   Social History Narrative   Non-smoker      occ alcohol      Married-husband does the cooking      Works outside the home    Past Surgical History:  Procedure  Laterality Date  . Abd U/S  11/1998   negative  . Abd U/S  01/2001   gallbladder polyps  . ABDOMINAL HYSTERECTOMY  1975   total-fibroids  . APPENDECTOMY  1975  . BREAST BIOPSY     benign  . CARDIOVERSION N/A 03/07/2016   Procedure: CARDIOVERSION;  Surgeon: Yates Decamp, MD;  Location: [facility] ENDOSCOPY;  Service: Cardiovascular;  Laterality: N/A;  . CATARACT EXTRACTION     bilateral  . CHOLECYSTECTOMY    . COLONOSCOPY  1998   Diverticulosis; polyp\  . COLONOSCOPY  09/2002   Diverticulosis, hem  . COLONOSCOPY  12/2007   diverticulosis, polyp  . DEXA  10/2001   osteopenia  . ELECTROPHYSIOLOGIC STUDY N/A 04/03/2016   Procedure: A-Flutter Ablation;  Surgeon: Will [name], MD;  Location: [facility] INVASIVE CV LAB;  Service: Cardiovascular;  Laterality: N/A;  . ESOPHAGOGASTRODUODENOSCOPY  2004  . Hida scan  01/2001   Negative  . KNEE SURGERY     right knee / 11/2004  . laser surgery for glaucoma Left Sep 21, 2014  . ROTATOR CUFF REPAIR     x 2 /right shoulder  . SKIN CANCER EXCISION     pre-melanoma / on face  . TEE WITHOUT CARDIOVERSION N/A 03/07/2016   Procedure: TRANSESOPHAGEAL  ECHOCARDIOGRAM (TEE);  Surgeon: Yates Decamp, MD;  Location: [facility] ENDOSCOPY;  Service: Cardiovascular;  Laterality: N/A;    Family History  Problem Relation Age of Onset  . Parkinsonism Father   . Colon cancer Brother   . Allergies Mother   . Heart disease Mother   . Kidney failure Son     congenital    Allergies  Allergen Reactions  . Shellfish Allergy Anaphylaxis and Nausea And Vomiting  . Tetracycline Hives, Nausea And Vomiting, Rash and Other (See Comments)    SYSTEMIC REACTION: heart palpitations,muscle spasms, vomiting  . Amlodipine Besylate     REACTION: muscle spasms, extremity swelling, low bp  . Ciprofloxacin     REACTION: muscle spasms, insomnia  . Codeine     REACTION: hallucinations  . Propoxyphene Hcl   . Sulfamethoxazole-Trimethoprim Nausea Only and Other (See Comments)    REACTION: dizzy,  could not focus eyes and could not concentrate and nausea  . Tape Other (See Comments)    BANDAIDS TAKE OFF HER SKIN; PLEASE USE COBAN OR PAPER   . Xarelto [Rivaroxaban]     Aches and pains and nervousness  . Amoxicillin Rash  . 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 Penicillins Swelling and Rash    Has patient had a PCN reaction causing immediate rash, facial/tongue/throat swelling, SOB or lightheadedness with hypotension: Yes Has patient had a PCN reaction causing severe rash involving mucus membranes or skin necrosis: No Has patient had a PCN reaction that required hospitalization No Has patient had a PCN reaction occurring within the last 10 years: No If all of the above answers are "NO", then may proceed with Cephalosporin use.     Current Outpatient Prescriptions on File Prior to Visit  Medication Sig Dispense Refill  . Ascorbic Acid (VITAMIN C) 1000 MG tablet Take 1,000 mg by mouth daily.      Marland Kitchen aspirin 81 MG tablet Take 81 mg by mouth daily.    . B Complex Vitamins (VITAMIN B COMPLEX PO) Take 1 tablet by mouth daily.    . Biotin 10 MG TABS Take 1 tablet by mouth every morning.     . Calcium Carbonate-Vit D-Min 600-400 MG-UNIT TABS Take 1 tablet by mouth every morning.     . chlorhexidine (PERIDEX) 0.12 % solution Use as directed 5-15 mLs in the mouth or throat daily as needed (for breakouts). Reported on 03/06/2016    . Cranberry 500 MG CAPS Take 1 capsule by mouth daily.     . cyclobenzaprine (FLEXERIL) 10 MG tablet Take 1 tablet (10 mg total) by mouth at bedtime as needed. 90 tablet 0  . estradiol (ESTRACE) 1 MG tablet Take 1 tablet (1 mg total) by mouth daily. 90 tablet 3  . fluticasone (FLONASE) 50 MCG/ACT nasal spray Place 1 spray into both nostrils daily as needed for allergies or rhinitis.    . folic acid  (FOLVITE) 1 MG tablet Take 1 mg by mouth daily.    Marland Kitchen ibuprofen (ADVIL,MOTRIN) 200 MG tablet Take 400 mg by mouth at bedtime. For leg pain/discomfort    . levothyroxine (SYNTHROID, LEVOTHROID) 88 MCG tablet TAKE ONE TABLET BY MOUTH EVERY MORNING BEFORE BREAKFAST 90 tablet 1  . Lutein 20 MG CAPS Take 1 capsule by mouth daily.     . methotrexate (RHEUMATREX) 2.5  MG tablet Take 7.5 mg by mouth every 7 (seven) days. [facility])    . montelukast (SINGULAIR) 10 MG tablet Take 1 tablet (10 mg total) by mouth at bedtime. 90 tablet 3  . Multiple Vitamin (MULTIVITAMIN) capsule Take 1 capsule by mouth daily.      . Pumpkin Seed-Soy Germ (AZO BLADDER CONTROL/GO-LESS PO) Take 310 mg by mouth 3 (three) times daily.    . TURMERIC PO Take 1 capsule by mouth 2 (two) times daily.     . vitamin B-12 (CYANOCOBALAMIN) 500 MCG tablet Take 500 mcg by mouth daily.     No current facility-administered medications on file prior to visit.     BP 130/84   Pulse 67   Temp 97.6 F (36.4 C) (Oral)   Ht 5\' 5"  (1.651 m)   Wt 181 lb 6.4 oz (82.3 kg)   LMP 08/26/1969   SpO2 96%   BMI 30.19 kg/m    Objective:   Physical Exam  Constitutional: She appears well-nourished. She does not appear ill.  HENT:  Right Ear: Tympanic membrane and ear canal normal.  Left Ear: Tympanic membrane and ear canal normal.  Nose: Right sinus exhibits no maxillary sinus tenderness and no frontal sinus tenderness. Left sinus exhibits no maxillary sinus tenderness and no frontal sinus tenderness.  Mouth/Throat: Oropharynx is clear and moist.  Eyes: Conjunctivae are normal.  Neck: Neck supple.  Cardiovascular: Normal rate and regular rhythm.   Pulmonary/Chest: Effort normal. She has decreased breath sounds in the right lower field. She has no wheezes. She has no rales.  Lymphadenopathy:    She has no cervical adenopathy.  Skin: Skin is warm and dry.  Right gluteal abscess healing well. Very mild erythema, non tender, no drainage.           Assessment & Plan:  URI:  Productive cough with expulsion of green mucous, congestion, fatigue x 3 days. Recently treated with Zpak for cellulitis (numerous antibiotic allergies). Exam today with mostly clear lungs, diminished to left base. Right gluteal abscess healing well, nearly resolved. Suspect viral URI, but will rule out any possibility for pneumonia given history and questionable lung sounds. Chest xray pending. Discussed to continue Mucinex. Fluids, rest, strict return precautions provided.  Morrie Sheldon, NP

## 2016-08-15 NOTE — Progress Notes (Signed)
Pre visit review using our clinic review tool, if applicable. No additional management support is needed unless otherwise documented below in the visit note. 

## 2016-08-15 NOTE — Patient Instructions (Signed)
Complete xray(s) prior to leaving today. I will notify you of your results once received.  Continue Mucinex as discussed.   Ensure you are staying hydrated with water and rest.  Please call us Tuesday next week if your symptoms become worse.  It was a pleasure meeting you!

## 2016-08-16 ENCOUNTER — Other Ambulatory Visit: Payer: Self-pay | Admitting: Primary Care

## 2016-08-16 DIAGNOSIS — J189 Pneumonia, unspecified organism: Secondary | ICD-10-CM

## 2016-08-16 MED ORDER — CLARITHROMYCIN 250 MG PO TABS: 250.0000 mg | ORAL_TABLET | Freq: Two times a day (BID) | ORAL | 0 refills | Status: DC

## 2016-08-20 ENCOUNTER — Telehealth: Payer: Self-pay | Admitting: Family Medicine

## 2016-08-20 DIAGNOSIS — J189 Pneumonia, unspecified organism: Secondary | ICD-10-CM

## 2016-08-20 MED ORDER — CLARITHROMYCIN 250 MG PO TABS: 250.0000 mg | ORAL_TABLET | Freq: Two times a day (BID) | ORAL | 0 refills | Status: DC

## 2016-08-20 NOTE — Telephone Encounter (Signed)
Rx sent to pharmacy and pt notified and advise of Dr. Marliss Coots comments and verbalized understanding

## 2016-08-20 NOTE — Telephone Encounter (Signed)
Noted  

## 2016-08-20 NOTE — Telephone Encounter (Signed)
Please extend clarithromycin for 5 extra days  Keep [place] posted and f/u if no continued improvement or if symptoms worsen

## 2016-08-20 NOTE — Telephone Encounter (Signed)
Pt called to give update on her pneumonia as requested by Allie Bossier at her appt last wk. Her fever is down but she is still coughing and it wakes her up at night. She has 3 more days of antibiotic but thinks she may need more. She is drinking lots of fluids and taking mucinex.

## 2016-09-26 ENCOUNTER — Other Ambulatory Visit: Payer: Self-pay | Admitting: Family Medicine

## 2016-09-26 DIAGNOSIS — J189 Pneumonia, unspecified organism: Secondary | ICD-10-CM

## 2016-10-02 ENCOUNTER — Other Ambulatory Visit: Payer: Self-pay | Admitting: Family Medicine

## 2016-10-03 ENCOUNTER — Other Ambulatory Visit: Payer: Self-pay | Admitting: Family Medicine

## 2016-10-27 ENCOUNTER — Telehealth: Payer: Self-pay | Admitting: Family Medicine

## 2016-10-27 DIAGNOSIS — I10 Essential (primary) hypertension: Secondary | ICD-10-CM

## 2016-10-27 DIAGNOSIS — R739 Hyperglycemia, unspecified: Secondary | ICD-10-CM

## 2016-10-27 DIAGNOSIS — E559 Vitamin D deficiency, unspecified: Secondary | ICD-10-CM

## 2016-10-27 DIAGNOSIS — E7800 Pure hypercholesterolemia, unspecified: Secondary | ICD-10-CM

## 2016-10-27 DIAGNOSIS — E039 Hypothyroidism, unspecified: Secondary | ICD-10-CM

## 2016-10-27 NOTE — Telephone Encounter (Signed)
-----   Message from Ellamae Sia sent at 10/23/2016  4:16 PM EST ----- Regarding: Lab orders for [facility], 3.12.18 Patient is scheduled for CPX labs, please order future labs, Thanks , Karna Christmas

## 2016-11-02 ENCOUNTER — Other Ambulatory Visit: Payer: Self-pay | Admitting: Family Medicine

## 2016-11-04 ENCOUNTER — Other Ambulatory Visit: Payer: Medicare Other

## 2016-11-05 DIAGNOSIS — H401131 Primary open-angle glaucoma, bilateral, mild stage: Secondary | ICD-10-CM | POA: Diagnosis not present

## 2016-11-05 DIAGNOSIS — Z961 Presence of intraocular lens: Secondary | ICD-10-CM | POA: Diagnosis not present

## 2016-11-05 DIAGNOSIS — H52203 Unspecified astigmatism, bilateral: Secondary | ICD-10-CM | POA: Diagnosis not present

## 2016-11-08 ENCOUNTER — Encounter: Payer: Medicare Other | Admitting: Family Medicine

## 2016-11-08 ENCOUNTER — Ambulatory Visit: Payer: Medicare Other

## 2016-11-15 ENCOUNTER — Encounter: Payer: Self-pay | Admitting: Family Medicine

## 2016-11-15 ENCOUNTER — Ambulatory Visit (INDEPENDENT_AMBULATORY_CARE_PROVIDER_SITE_OTHER): Payer: Medicare Other

## 2016-11-15 ENCOUNTER — Ambulatory Visit (INDEPENDENT_AMBULATORY_CARE_PROVIDER_SITE_OTHER): Payer: Medicare Other | Admitting: Family Medicine

## 2016-11-15 VITALS — BP 126/80 | HR 91 | Temp 97.5°F | Ht 62.25 in | Wt 176.5 lb

## 2016-11-15 DIAGNOSIS — E039 Hypothyroidism, unspecified: Secondary | ICD-10-CM | POA: Diagnosis not present

## 2016-11-15 DIAGNOSIS — M8589 Other specified disorders of bone density and structure, multiple sites: Secondary | ICD-10-CM

## 2016-11-15 DIAGNOSIS — R739 Hyperglycemia, unspecified: Secondary | ICD-10-CM

## 2016-11-15 DIAGNOSIS — Z6832 Body mass index (BMI) 32.0-32.9, adult: Secondary | ICD-10-CM

## 2016-11-15 DIAGNOSIS — I10 Essential (primary) hypertension: Secondary | ICD-10-CM

## 2016-11-15 DIAGNOSIS — Z0000 Encounter for general adult medical examination without abnormal findings: Secondary | ICD-10-CM | POA: Diagnosis not present

## 2016-11-15 DIAGNOSIS — Z1231 Encounter for screening mammogram for malignant neoplasm of breast: Secondary | ICD-10-CM | POA: Diagnosis not present

## 2016-11-15 DIAGNOSIS — E7800 Pure hypercholesterolemia, unspecified: Secondary | ICD-10-CM

## 2016-11-15 DIAGNOSIS — E2839 Other primary ovarian failure: Secondary | ICD-10-CM | POA: Diagnosis not present

## 2016-11-15 DIAGNOSIS — E559 Vitamin D deficiency, unspecified: Secondary | ICD-10-CM | POA: Diagnosis not present

## 2016-11-15 DIAGNOSIS — E6609 Other obesity due to excess calories: Secondary | ICD-10-CM | POA: Diagnosis not present

## 2016-11-15 DIAGNOSIS — I483 Typical atrial flutter: Secondary | ICD-10-CM | POA: Diagnosis not present

## 2016-11-15 DIAGNOSIS — E66811 Obesity, class 1: Secondary | ICD-10-CM

## 2016-11-15 LAB — COMPREHENSIVE METABOLIC PANEL
ALT: 13 U/L (ref 0–35)
AST: 16 U/L (ref 0–37)
Albumin: 3.6 g/dL (ref 3.5–5.2)
Alkaline Phosphatase: 76 U/L (ref 39–117)
BUN: 16 mg/dL (ref 6–23)
CO2: 27 mEq/L (ref 19–32)
Calcium: 9.3 mg/dL (ref 8.4–10.5)
Chloride: 106 mEq/L (ref 96–112)
Creatinine, Ser: 0.96 mg/dL (ref 0.40–1.20)
GFR: 59.19 mL/min — ABNORMAL LOW (ref >60.00)
Glucose, Bld: 88 mg/dL (ref 70–99)
Potassium: 4.7 mEq/L (ref 3.5–5.1)
Sodium: 139 mEq/L (ref 135–145)
Total Bilirubin: 0.8 mg/dL (ref 0.2–1.2)
Total Protein: 6.7 g/dL (ref 6.0–8.3)

## 2016-11-15 LAB — LIPID PANEL
Cholesterol: 196 mg/dL (ref 0–200)
HDL: 67.1 mg/dL (ref >39.00)
LDL Cholesterol: 114 mg/dL — ABNORMAL HIGH (ref 0–99)
NonHDL: 128.53
Total CHOL/HDL Ratio: 3
Triglycerides: 75 mg/dL (ref 0.0–149.0)
VLDL: 15 mg/dL (ref 0.0–40.0)

## 2016-11-15 LAB — CBC WITH DIFFERENTIAL/PLATELET
Basophils Absolute: 0.1 10*3/uL (ref 0.0–0.1)
Basophils Relative: 0.7 % (ref 0.0–3.0)
Eosinophils Absolute: 0.3 10*3/uL (ref 0.0–0.7)
Eosinophils Relative: 3.8 % (ref 0.0–5.0)
HCT: 40.5 % (ref 36.0–46.0)
Hemoglobin: 13.5 g/dL (ref 12.0–15.0)
Lymphocytes Relative: 34.4 % (ref 12.0–46.0)
Lymphs Abs: 2.6 10*3/uL (ref 0.7–4.0)
MCHC: 33.2 g/dL (ref 30.0–36.0)
MCV: 93.6 fl (ref 78.0–100.0)
Monocytes Absolute: 0.6 10*3/uL (ref 0.1–1.0)
Monocytes Relative: 7.4 % (ref 3.0–12.0)
Neutro Abs: 4 10*3/uL (ref 1.4–7.7)
Neutrophils Relative %: 53.7 % (ref 43.0–77.0)
Platelets: 361 10*3/uL (ref 150.0–400.0)
RBC: 4.33 Mil/uL (ref 3.87–5.11)
RDW: 13.6 % (ref 11.5–15.5)
WBC: 7.5 10*3/uL (ref 4.0–10.5)

## 2016-11-15 LAB — VITAMIN D 25 HYDROXY (VIT D DEFICIENCY, FRACTURES): VITD: 52.53 ng/mL (ref 30.00–100.00)

## 2016-11-15 LAB — HEMOGLOBIN A1C: Hgb A1c MFr Bld: 5.4 % (ref 4.6–6.5)

## 2016-11-15 LAB — TSH: TSH: 1.12 u[IU]/mL (ref 0.35–4.50)

## 2016-11-15 MED ORDER — LEVOTHYROXINE SODIUM 88 MCG PO TABS: 88.0000 ug | ORAL_TABLET | Freq: Every day | ORAL | 3 refills | Status: DC

## 2016-11-15 MED ORDER — ESTRADIOL 1 MG PO TABS: 1.0000 mg | ORAL_TABLET | Freq: Every day | ORAL | 3 refills | Status: DC

## 2016-11-15 NOTE — Assessment & Plan Note (Signed)
Lab Results  Component Value Date   HGBA1C 5.3 11/01/2015   Pending today's labs She continues to watch diet disc imp of low glycemic diet and wt loss to prevent DM2

## 2016-11-15 NOTE — Assessment & Plan Note (Signed)
Scheduled annual screening mammogram Nl breast exam today  Encouraged monthly self exams   

## 2016-11-15 NOTE — Progress Notes (Signed)
Pre visit review using our clinic review tool, if applicable. No additional management support is needed unless otherwise documented below in the visit note. 

## 2016-11-15 NOTE — Assessment & Plan Note (Signed)
No clinical changes Pending TSH from today

## 2016-11-15 NOTE — Patient Instructions (Addendum)
  I will refer you for bone density test and mammogram - stop at check out   Keep drinking lots of water  Aim for 64 oz per day of fluids  Try miralax for constipation - daily or as needed   Stay as active as you can be   Labs today - we will let you know when we get results

## 2016-11-15 NOTE — Progress Notes (Signed)
Subjective:   Linda Mcgrath is a 83 y.o. female who presents for Medicare Annual (Subsequent) preventive examination.  Review of Systems:  N/A Cardiac Risk Factors include: advanced age (>42men, >61 women);obesity (BMI >30kg/m2);dyslipidemia;hypertension     Objective:     Vitals: BP 126/80 (BP Location: Right Arm, Patient Position: Sitting, Cuff Size: Normal)   Pulse 91   Temp 97.5 F (36.4 C) (Oral)   Ht 5' 2.25" (1.581 m) Comment: no shoes  Wt 176 lb 8 oz (80.1 kg)   LMP 08/26/1969   SpO2 97%   BMI 32.02 kg/m   Body mass index is 32.02 kg/m.   Tobacco History  Smoking Status  . Never Smoker  Smokeless Tobacco  . Never Used     Counseling given: No   Past Medical History:  Diagnosis Date  . Allergic rhinitis   . Cataract    Bil/lens implant  . Colon polyp 1999   small polyp  . Diverticulosis   . Fibromyalgia   . Hyperlipidemia   . Hypertension   . Hypothyroidism   . Internal hemorrhoids   . Leg cramps   . Lung nodule    stable LUL 9 mm  . Menopausal syndrome   . Osteoarthritis   . UTI (urinary tract infection)    Past Surgical History:  Procedure Laterality Date  . Abd U/S  11/1998   negative  . Abd U/S  01/2001   gallbladder polyps  . ABDOMINAL HYSTERECTOMY  1975   total-fibroids  . APPENDECTOMY  1975  . BREAST BIOPSY     benign  . CARDIOVERSION N/A 03/07/2016   Procedure: CARDIOVERSION;  Surgeon: Adrian Prows, MD;  Location: [facility];  Service: Cardiovascular;  Laterality: N/A;  . CATARACT EXTRACTION     bilateral  . CHOLECYSTECTOMY    . COLONOSCOPY  1998   Diverticulosis; polyp\  . COLONOSCOPY  09/2002   Diverticulosis, hem  . COLONOSCOPY  12/2007   diverticulosis, polyp  . DEXA  10/2001   osteopenia  . ELECTROPHYSIOLOGIC STUDY N/A 04/03/2016   Procedure: A-Flutter Ablation;  Surgeon: Will Meredith Leeds, MD;  Location: [facility];  Service: Cardiovascular;  Laterality: N/A;  . ESOPHAGOGASTRODUODENOSCOPY  2004  .  [place] scan  01/2001   Negative  . KNEE SURGERY     right knee / 11/2004  . laser surgery for glaucoma Left Sep 21, 2014  . ROTATOR CUFF REPAIR     x 2 /right shoulder  . SKIN CANCER EXCISION     pre-melanoma / on face  . TEE WITHOUT CARDIOVERSION N/A 03/07/2016   Procedure: TRANSESOPHAGEAL ECHOCARDIOGRAM (TEE);  Surgeon: Adrian Prows, MD;  Location: [facility];  Service: Cardiovascular;  Laterality: N/A;   Family History  Problem Relation Age of Onset  . Parkinsonism Father   . Colon cancer Brother   . Allergies Mother   . Heart disease Mother   . Kidney failure Son     congenital   History  Sexual Activity  . Sexual activity: No    Outpatient Encounter Prescriptions as of 11/15/2016  Medication Sig  . Ascorbic Acid (VITAMIN C) 1000 MG tablet Take 1,000 mg by mouth daily.    Marland Kitchen aspirin 81 MG tablet Take 81 mg by mouth daily.  . B Complex Vitamins (VITAMIN B COMPLEX PO) Take 1 tablet by mouth daily.  . Biotin 10 MG TABS Take 1 tablet by mouth every morning.   . Calcium Carbonate-Vit D-Min 600-400 MG-UNIT TABS Take 1 tablet  by mouth every morning.   . chlorhexidine (PERIDEX) 0.12 % solution Use as directed 5-15 mLs in the mouth or throat daily as needed (for breakouts). Reported on 03/06/2016  . Cranberry 500 MG CAPS Take 1 capsule by mouth daily.   . cyclobenzaprine (FLEXERIL) 10 MG tablet Take 1 tablet (10 mg total) by mouth at bedtime as needed.  Marland Kitchen estradiol (ESTRACE) 1 MG tablet TAKE 1 TABLET (1 MG TOTAL) BY MOUTH DAILY.  . fluticasone (FLONASE) 50 MCG/ACT nasal spray Place 1 spray into both nostrils daily as needed for allergies or rhinitis.  . folic acid (FOLVITE) 1 MG tablet Take 1 mg by mouth daily.  Marland Kitchen ibuprofen (ADVIL,MOTRIN) 200 MG tablet Take 400 mg by mouth at bedtime. For leg pain/discomfort  . levothyroxine (SYNTHROID, LEVOTHROID) 88 MCG tablet TAKE ONE TABLET BY MOUTH EVERY MORNING BEFORE BREAKFAST  . Lutein 20 MG CAPS Take 1 capsule by mouth daily.   . methotrexate  (RHEUMATREX) 2.5 MG tablet Take 7.5 mg by mouth every 7 (seven) days. [facility])  . montelukast (SINGULAIR) 10 MG tablet Take 1 tablet (10 mg total) by mouth at bedtime.  . Multiple Vitamin (MULTIVITAMIN) capsule Take 1 capsule by mouth daily.    . Pumpkin Seed-Soy Germ (AZO BLADDER CONTROL/GO-LESS PO) Take 310 mg by mouth 3 (three) times daily.  . TURMERIC PO Take 1 capsule by mouth 2 (two) times daily.   . vitamin B-12 (CYANOCOBALAMIN) 500 MCG tablet Take 500 mcg by mouth daily.  . [DISCONTINUED] clarithromycin (BIAXIN) 250 MG tablet Take 1 tablet (250 mg total) by mouth 2 (two) times daily.   No facility-administered encounter medications on file as of 11/15/2016.     Activities of Daily Living In your present state of health, do you have any difficulty performing the following activities: 11/15/2016 04/03/2016  Hearing? N -  Vision? N -  Difficulty concentrating or making decisions? N -  Walking or climbing stairs? N -  Dressing or bathing? N -  Doing errands, shopping? N N  Preparing Food and eating ? N -  Using the Toilet? N -  In the past six months, have you accidently leaked urine? N -  Do you have problems with loss of bowel control? N -  Managing your Medications? N -  Managing your Finances? N -  Housekeeping or managing your Housekeeping? N -  Some recent data might be hidden    Patient Care Team: Abner Greenspan, MD as PCP - General Rana Snare, MD as Consulting Physician (Urology) Randel Pigg, MD as Referring Physician (Dermatology) Luberta Mutter, MD as Consulting Physician (Ophthalmology)    Assessment:     Hearing Screening   125Hz  250Hz  500Hz  1000Hz  2000Hz  3000Hz  4000Hz  6000Hz  8000Hz   Right ear:   40 40 40  40    Left ear:   40 40 40  40    Vision Screening Comments: Last vision exam on November 05, 2016   Exercise Activities and Dietary recommendations Current Exercise Habits: Home exercise routine, Type of exercise: walking, Time (Minutes): 60,  Frequency (Times/Week): 5, Weekly Exercise (Minutes/Week): 300, Intensity: Mild, Exercise limited by: None identified  Goals    . Increase physical activity          Starting 11/15/2016, I continue to increase walking for an additional 20 minutes daily.       Fall Risk Fall Risk  11/15/2016 11/06/2015 10/25/2014 06/16/2013  Falls in the past year? No No No Yes  Number falls in past  yr: - - - 1  Injury with Fall? - - - No   Depression Screen PHQ 2/9 Scores 11/15/2016 11/06/2015 10/25/2014 06/16/2013  PHQ - 2 Score 0 0 1 0  PHQ- 9 Score - - 3 -     Cognitive Function MMSE - Mini Mental State Exam 11/15/2016 11/06/2015  Orientation to time 5 5  Orientation to Place 5 5  Registration 3 3  Attention/ Calculation 0 5  Recall 3 3  Language- name 2 objects 0 0  Language- repeat 1 1  Language- follow 3 step command 3 3  Language- read & follow direction 0 1  Write a sentence 0 0  Copy design 0 0  Total score 20 26     PLEASE NOTE: A Mini-Cog screen was completed. Maximum score is 20. A value of 0 denotes this part of Folstein MMSE was not completed or the patient failed this part of the Mini-Cog screening.   Mini-Cog Screening Orientation to Time - Max 5 pts Orientation to Place - Max 5 pts Registration - Max 3 pts Recall - Max 3 pts Language Repeat - Max 1 pts Language Follow 3 Step Command - Max 3 pts     Immunization History  Administered Date(s) Administered  . Influenza Split 05/11/2011  . Influenza Whole 05/27/2007, 05/21/2008, 04/29/2009, 05/28/2012  . Influenza, High Dose Seasonal PF 05/12/2014, 05/20/2016  . Influenza-Unspecified 05/07/2013  . Pneumococcal Conjugate-13 10/25/2014  . Pneumococcal Polysaccharide-23 08/27/2003  . Td 12/26/2003  . Tdap 11/07/2014  . Zoster 03/29/2008   Screening Tests Health Maintenance  Topic Date Due  . MAMMOGRAM  11/01/2017 (Originally 11/02/2016)  . TETANUS/TDAP  11/06/2024  . INFLUENZA VACCINE  Addressed  . DEXA SCAN  Completed    . PNA vac Low Risk Adult  Completed      Plan:    I have personally reviewed and addressed the Medicare Annual Wellness questionnaire and have noted the following in the patient's chart:  A. Medical and social history B. Use of alcohol, tobacco or illicit drugs  C. Current medications and supplements D. Functional ability and status E.  Nutritional status F.  Physical activity G. Advance directives H. List of other physicians I.  Hospitalizations, surgeries, and ER visits in previous 12 months J.  [place] to include hearing, vision, cognitive, depression L. Referrals and appointments - none  In addition, I have reviewed and discussed with patient certain preventive protocols, quality metrics, and best practice recommendations. A written personalized care plan for preventive services as well as general preventive health recommendations were provided to patient.  See attached scanned questionnaire for additional information.   Signed,   Lindell Noe, MHA, BS, LPN Health Coach

## 2016-11-15 NOTE — Progress Notes (Signed)
PCP notes:   Health maintenance:  Mammogram - addressed; pt plans to schedule future appt  Abnormal screenings:   None  Patient concerns:   None  Nurse concerns:  None  Next PCP appt:   11/15/16 @ 1215  I reviewed health advisor's note, was available for consultation, and agree with documentation and plan.  Loura Pardon MD

## 2016-11-15 NOTE — Assessment & Plan Note (Signed)
Discussed how this problem influences overall health and the risks it imposes  Reviewed plan for weight loss with lower calorie diet (via better food choices and also portion control or program like weight watchers) and exercise building up to or more than 30 minutes 5 days per week including some aerobic activity    

## 2016-11-15 NOTE — Patient Instructions (Signed)
Linda Mcgrath , Thank you for taking time to come for your Medicare Wellness Visit. I appreciate your ongoing commitment to your health goals. Please review the following plan we discussed and let me know if I can assist you in the future.   These are the goals we discussed: Goals    . Increase physical activity          Starting 11/15/2016, I continue to increase walking for an additional 20 minutes daily.        This is a list of the screening recommended for you and due dates:  Health Maintenance  Topic Date Due  . Mammogram  11/01/2017*  . Tetanus Vaccine  11/06/2024  . Flu Shot  Addressed  . DEXA scan (bone density measurement)  Completed  . Pneumonia vaccines  Completed  *Topic was postponed. The date shown is not the original due date.   Preventive Care for Adults  A healthy lifestyle and preventive care can promote health and wellness. Preventive health guidelines for adults include the following key practices.  . A routine yearly physical is a good way to check with your health care provider about your health and preventive screening. It is a chance to share any concerns and updates on your health and to receive a thorough exam.  . Visit your dentist for a routine exam and preventive care every 6 months. Brush your teeth twice a day and floss once a day. Good oral hygiene prevents tooth decay and gum disease.  . The frequency of eye exams is based on your age, health, family medical history, use  of contact lenses, and other factors. Follow your health care provider's ecommendations for frequency of eye exams.  . Eat a healthy diet. Foods like vegetables, fruits, whole grains, low-fat dairy products, and lean protein foods contain the nutrients you need without too many calories. Decrease your intake of foods high in solid fats, added sugars, and salt. Eat the right amount of calories for you. Get information about a proper diet from your health care provider, if  necessary.  . Regular physical exercise is one of the most important things you can do for your health. Most adults should get at least 150 minutes of moderate-intensity exercise (any activity that increases your heart rate and causes you to sweat) each week. In addition, most adults need muscle-strengthening exercises on 2 or more days a week.  Silver Sneakers may be a benefit available to you. To determine eligibility, you may visit the website: www.silversneakers.com or contact program at 972-837-8582 Mon-Fri between 8AM-8PM.   . Maintain a healthy weight. The body mass index (BMI) is a screening tool to identify possible weight problems. It provides an estimate of body fat based on height and weight. Your health care provider can find your BMI and can help you achieve or maintain a healthy weight.   For adults 20 years and older: ? A BMI below 18.5 is considered underweight. ? A BMI of 18.5 to 24.9 is normal. ? A BMI of 25 to 29.9 is considered overweight. ? A BMI of 30 and above is considered obese.   . Maintain normal blood lipids and cholesterol levels by exercising and minimizing your intake of saturated fat. Eat a balanced diet with plenty of fruit and vegetables. Blood tests for lipids and cholesterol should begin at age 35 and be repeated every 5 years. If your lipid or cholesterol levels are high, you are over 50, or you are at high risk  for heart disease, you may need your cholesterol levels checked more frequently. Ongoing high lipid and cholesterol levels should be treated with medicines if diet and exercise are not working.  . If you smoke, find out from your health care provider how to quit. If you do not use tobacco, please do not start.  . If you choose to drink alcohol, please do not consume more than 2 drinks per day. One drink is considered to be 12 ounces (355 mL) of beer, 5 ounces (148 mL) of wine, or 1.5 ounces (44 mL) of liquor.  . If you are 2-71 years old, ask your  health care provider if you should take aspirin to prevent strokes.  . Use sunscreen. Apply sunscreen liberally and repeatedly throughout the day. You should seek shade when your shadow is shorter than you. Protect yourself by wearing long sleeves, pants, a wide-brimmed hat, and sunglasses year round, whenever you are outdoors.  . Once a month, do a whole body skin exam, using a mirror to look at the skin on your back. Tell your health care provider of new moles, moles that have irregular borders, moles that are larger than a pencil eraser, or moles that have changed in shape or color.

## 2016-11-15 NOTE — Assessment & Plan Note (Signed)
Pt continues estrace - and understands risks at her age incl breast cancer and blood clots  Feels better with it  Intolerant of menop symptoms w/o it  Also helps bone health (though that is not reason for px)  Refilled today dexa ordered

## 2016-11-15 NOTE — Assessment & Plan Note (Signed)
bp in fair control at this time  BP Readings from Last 1 Encounters:  11/15/16 126/80   No changes needed Disc lifstyle change with low sodium diet and exercise  Labs pending

## 2016-11-15 NOTE — Assessment & Plan Note (Signed)
D level today Disc imp for bone and overall health

## 2016-11-15 NOTE — Assessment & Plan Note (Signed)
No re occurrence Doing well

## 2016-11-15 NOTE — Assessment & Plan Note (Signed)
Disc goals for lipids and reasons to control them Rev labs with pt (last draw)-todays labs pending  Rev low sat fat diet in detail

## 2016-11-15 NOTE — Assessment & Plan Note (Signed)
Will schedule dexa for this spring No falls or fx Disc need for calcium/ vitamin D/ wt bearing exercise and bone density test every 2 y to monitor Disc safety/ fracture risk in detail

## 2016-11-15 NOTE — Progress Notes (Signed)
Subjective:    Patient ID: Linda Mcgrath, female    DOB: 06-11-1933, 83 y.o.   MRN: [id number]  HPI Here for annual f/u of chronic health problems   Had bad uri in dec Got over that and then stayed in [place] low  occ cough w/o sob   Had AMW visit today  No concerns   Wt Readings from Last 3 Encounters:  11/15/16 176 lb 8 oz (80.1 kg)  11/15/16 176 lb 8 oz (80.1 kg)  08/15/16 181 lb 6.4 oz (82.3 kg)  down 5 lb since winter -really trying to cut back and eat healthier Getting carbs from fruit/veg  Exercise- by doing all of her own housework and yard work  bmi 32.0   Mammogram 3/17 normal- needs to schedule one now  Self breast exam -no lumps or changes   Pap/gyn care - hysterectomy total In the past  No problems or symptoms   She wants to stay on her estrace- cannot tolerate being off of it Helps her menopause symptoms and aches and pains  Not on it for OP but knows that it helps  Understands breast cancer risks as well as inc risk of blood clots     Zoster vaccine 8/09 Flu vaccine utd Tetanus vaccine utd  Colonoscopy/ screening : 7/14 with tubular adenoma  Constipation occasionally  Drinks lots and lots of water  occ MOM   dexa 3/16 osteopenia  Nl D level 3/17 No falls or fractures  Vit D level today   bp is stable today  No cp or palpitations or headaches or edema  No side effects to medicines  BP Readings from Last 3 Encounters:  11/15/16 126/80  11/15/16 126/80  08/15/16 130/84     Hypothyroidism  Pt has no clinical changes No missed doses  No change in energy level/ hair or skin/ edema and no tremor Lab Results  Component Value Date   TSH 3.13 11/01/2015    Due for labs today    Hx of hyperlipidemia Lab Results  Component Value Date   CHOL 200 11/01/2015   HDL 72.60 11/01/2015   LDLCALC 106 (H) 11/01/2015   LDLDIRECT 125.7 06/09/2013   TRIG 107.0 11/01/2015   CHOLHDL 3 11/01/2015   Due for labs today Diet has been better  overall  [name] about once per week   Hyperglycemia Lab Results  Component Value Date   HGBA1C 5.3 11/01/2015   Due for lab today  Patient Active Problem List   Diagnosis Date Noted  . Abscess and cellulitis of gluteal region 08/07/2016  . Atrial flutter (HCC) 03/06/2016  . Severe headache 03/04/2016  . Obesity 11/06/2015  . Urge incontinence 01/24/2015  . Estrogen deficiency 10/25/2014  . Lichen planus 12/20/2013  . Encounter for Medicare annual wellness exam 06/17/2013  . Aphthous ulcer of mouth 03/16/2013  . Screening mammogram, encounter for 06/15/2012  . Hyperglycemia 06/03/2011  . HYPERTENSION, BENIGN ESSENTIAL 10/23/2010  . CONSTIPATION 10/23/2010  . HEMATOCHEZIA 10/23/2010  . OSTEOARTHRITIS, HANDS, BILATERAL 01/24/2010  . Hypothyroidism 08/24/2008  . Vitamin D deficiency 06/06/2008  . Hyperlipidemia 06/06/2008  . MENOPAUSAL SYNDROME 03/29/2008  . Osteopenia 03/29/2008  . ALLERGIC RHINITIS 01/25/2008  . IBS 01/25/2008  . OSTEOARTHRITIS 01/25/2008  . FIBROMYALGIA 01/25/2008   Past Medical History:  Diagnosis Date  . Allergic rhinitis   . Cataract    Bil/lens implant  . Colon polyp 1999   small polyp  . Diverticulosis   . Fibromyalgia   . Hyperlipidemia   .  Hypertension   . Hypothyroidism   . Internal hemorrhoids   . Leg cramps   . Lung nodule    stable LUL 9 mm  . Menopausal syndrome   . Osteoarthritis   . UTI (urinary tract infection)    Past Surgical History:  Procedure Laterality Date  . Abd U/S  11/1998   negative  . Abd U/S  01/2001   gallbladder polyps  . ABDOMINAL HYSTERECTOMY  1975   total-fibroids  . APPENDECTOMY  1975  . BREAST BIOPSY     benign  . CARDIOVERSION N/A 03/07/2016   Procedure: CARDIOVERSION;  Surgeon: Yates Decamp, MD;  Location: [facility] ENDOSCOPY;  Service: Cardiovascular;  Laterality: N/A;  . CATARACT EXTRACTION     bilateral  . CHOLECYSTECTOMY    . COLONOSCOPY  1998   Diverticulosis; polyp\  . COLONOSCOPY  09/2002    Diverticulosis, hem  . COLONOSCOPY  12/2007   diverticulosis, polyp  . DEXA  10/2001   osteopenia  . ELECTROPHYSIOLOGIC STUDY N/A 04/03/2016   Procedure: A-Flutter Ablation;  Surgeon: Will [name], MD;  Location: [facility] INVASIVE CV LAB;  Service: Cardiovascular;  Laterality: N/A;  . ESOPHAGOGASTRODUODENOSCOPY  2004  . [place] scan  01/2001   Negative  . KNEE SURGERY     right knee / 11/2004  . laser surgery for glaucoma Left Sep 21, 2014  . ROTATOR CUFF REPAIR     x 2 /right shoulder  . SKIN CANCER EXCISION     pre-melanoma / on face  . TEE WITHOUT CARDIOVERSION N/A 03/07/2016   Procedure: TRANSESOPHAGEAL ECHOCARDIOGRAM (TEE);  Surgeon: Yates Decamp, MD;  Location: [facility] ENDOSCOPY;  Service: Cardiovascular;  Laterality: N/A;   Social History  Substance Use Topics  . Smoking status: Never Smoker  . Smokeless tobacco: Never Used  . Alcohol use No     Comment: occasional-rare   Family History  Problem Relation Age of Onset  . Parkinsonism Father   . Colon cancer Brother   . Allergies Mother   . Heart disease Mother   . Kidney failure Son     congenital   Allergies  Allergen Reactions  . Shellfish Allergy Anaphylaxis and Nausea And Vomiting  . Tetracycline Hives, Nausea And Vomiting, Rash and Other (See Comments)    SYSTEMIC REACTION: heart palpitations,muscle spasms, vomiting  . Amlodipine Besylate     REACTION: muscle spasms, extremity swelling, low bp  . Ciprofloxacin     REACTION: muscle spasms, insomnia  . Codeine     REACTION: hallucinations  . Propoxyphene Hcl   . Sulfamethoxazole-Trimethoprim Nausea Only and Other (See Comments)    REACTION: dizzy, could not focus eyes and could not concentrate and nausea  . Tape Other (See Comments)    BANDAIDS TAKE OFF HER SKIN; PLEASE USE COBAN OR PAPER   . Xarelto [Rivaroxaban]     Aches and pains and nervousness  . Amoxicillin Rash  . Other Swelling, Rash and Other (See Comments)    Has autoimmune disorder and spices erode her  salivary glands and affects ankles and mouth DIRECTLY (takes off 1st layer of skin and leaves her unable to walk)  . Penicillins Swelling and Rash    Has patient had a PCN reaction causing immediate rash, facial/tongue/throat swelling, SOB or lightheadedness with hypotension: Yes Has patient had a PCN reaction causing severe rash involving mucus membranes or skin necrosis: No Has patient had a PCN reaction that required hospitalization No Has patient had a PCN reaction occurring within the last 10  years: No If all of the above answers are "NO", then may proceed with Cephalosporin use.    Current Outpatient Prescriptions on File Prior to Visit  Medication Sig Dispense Refill  . Ascorbic Acid (VITAMIN C) 1000 MG tablet Take 1,000 mg by mouth daily.      Marland Kitchen aspirin 81 MG tablet Take 81 mg by mouth daily.    . B Complex Vitamins (VITAMIN B COMPLEX PO) Take 1 tablet by mouth daily.    . Biotin 10 MG TABS Take 1 tablet by mouth every morning.     . Calcium Carbonate-Vit D-Min 600-400 MG-UNIT TABS Take 1 tablet by mouth every morning.     . chlorhexidine (PERIDEX) 0.12 % solution Use as directed 5-15 mLs in the mouth or throat daily as needed (for breakouts). Reported on 03/06/2016    . Cranberry 500 MG CAPS Take 1 capsule by mouth daily.     . cyclobenzaprine (FLEXERIL) 10 MG tablet Take 1 tablet (10 mg total) by mouth at bedtime as needed. 90 tablet 0  . fluticasone (FLONASE) 50 MCG/ACT nasal spray Place 1 spray into both nostrils daily as needed for allergies or rhinitis.    . folic acid (FOLVITE) 1 MG tablet Take 1 mg by mouth daily.    Marland Kitchen ibuprofen (ADVIL,MOTRIN) 200 MG tablet Take 400 mg by mouth at bedtime. For leg pain/discomfort    . Lutein 20 MG CAPS Take 1 capsule by mouth daily.     . methotrexate (RHEUMATREX) 2.5 MG tablet Take 7.5 mg by mouth every 7 (seven) days. [facility])    . montelukast (SINGULAIR) 10 MG tablet Take 1 tablet (10 mg total) by mouth at bedtime. 90 tablet 3  .  Multiple Vitamin (MULTIVITAMIN) capsule Take 1 capsule by mouth daily.      . Pumpkin Seed-Soy Germ (AZO BLADDER CONTROL/GO-LESS PO) Take 310 mg by mouth 3 (three) times daily.    . TURMERIC PO Take 1 capsule by mouth 2 (two) times daily.     . vitamin B-12 (CYANOCOBALAMIN) 500 MCG tablet Take 500 mcg by mouth daily.     No current facility-administered medications on file prior to visit.     Review of Systems Review of Systems  Constitutional: Negative for fever, appetite change, fatigue and unexpected weight change.  Eyes: Negative for pain and visual disturbance.  Respiratory: Negative for cough and shortness of breath.   Cardiovascular: Negative for cp or palpitations    Gastrointestinal: Negative for nausea, diarrhea and constipation.  Genitourinary: Negative for urgency and frequency.  Skin: Negative for pallor or rash   MSK pos for joint stiffness from OA Neurological: Negative for weakness, light-headedness, numbness and headaches.  Hematological: Negative for adenopathy. Does not bruise/bleed easily.  Psychiatric/Behavioral: Negative for dysphoric mood. The patient is not nervous/anxious.         Objective:   Physical Exam  Constitutional: She appears well-developed and well-nourished. No distress.  obese and well appearing   HENT:  Head: Normocephalic and atraumatic.  Right Ear: External ear normal.  Left Ear: External ear normal.  Mouth/Throat: Oropharynx is clear and moist.  Eyes: Conjunctivae and EOM are normal. Pupils are equal, round, and reactive to light. No scleral icterus.  Neck: Normal range of motion. Neck supple. No JVD present. Carotid bruit is not present. No thyromegaly present.  Cardiovascular: Normal rate, regular rhythm, normal heart sounds and intact distal pulses.  Exam reveals no gallop.   Pulmonary/Chest: Effort normal and breath sounds normal. No respiratory  distress. She has no wheezes. She exhibits no tenderness.  Abdominal: Soft. Bowel sounds  are normal. She exhibits no distension, no abdominal bruit and no mass. There is no tenderness.  Genitourinary: No breast swelling, tenderness, discharge or bleeding.  Genitourinary Comments: Breast exam: No mass, nodules, thickening, tenderness, bulging, retraction, inflamation, nipple discharge or skin changes noted.  No axillary or clavicular LA.      Musculoskeletal: Normal range of motion. She exhibits no edema or tenderness.  Lymphadenopathy:    She has no cervical adenopathy.  Neurological: She is alert. She has normal reflexes. No cranial nerve deficit. She exhibits normal muscle tone. Coordination normal.  Skin: Skin is warm and dry. No rash noted. No erythema. No pallor.  Fair complexion  Few brown nevi  Psychiatric: She has a normal mood and affect.          Assessment & Plan:   Problem List Items Addressed This Visit      Cardiovascular and Mediastinum   Atrial flutter (HCC)    No re occurrence Doing well       HYPERTENSION, BENIGN ESSENTIAL - Primary    bp in fair control at this time  BP Readings from Last 1 Encounters:  11/15/16 126/80   No changes needed Disc lifstyle change with low sodium diet and exercise  Labs pending         Endocrine   Hypothyroidism    No clinical changes Pending TSH from today      Relevant Medications   levothyroxine (SYNTHROID, LEVOTHROID) 88 MCG tablet     Musculoskeletal and Integument   Osteopenia    Will schedule dexa for this spring No falls or fx Disc need for calcium/ vitamin D/ wt bearing exercise and bone density test every 2 y to monitor Disc safety/ fracture risk in detail          Other   Estrogen deficiency    Pt continues estrace - and understands risks at her age incl breast cancer and blood clots  Feels better with it  Intolerant of menop symptoms w/o it  Also helps bone health (though that is not reason for px)  Refilled today dexa ordered       Relevant Orders   DG Bone Density    Hyperglycemia    Lab Results  Component Value Date   HGBA1C 5.3 11/01/2015   Pending today's labs She continues to watch diet disc imp of low glycemic diet and wt loss to prevent DM2       Hyperlipidemia    Disc goals for lipids and reasons to control them Rev labs with pt (last draw)-todays labs pending  Rev low sat fat diet in detail       Obesity    Discussed how this problem influences overall health and the risks it imposes  Reviewed plan for weight loss with lower calorie diet (via better food choices and also portion control or program like weight watchers) and exercise building up to or more than 30 minutes 5 days per week including some aerobic activity         Screening mammogram, encounter for    Scheduled annual screening mammogram Nl breast exam today  Encouraged monthly self exams        Relevant Orders   MM DIGITAL SCREENING BILATERAL   Vitamin D deficiency    D level today Disc imp for bone and overall health

## 2016-11-18 ENCOUNTER — Encounter: Payer: Self-pay | Admitting: *Deleted

## 2016-12-06 ENCOUNTER — Ambulatory Visit
Admission: RE | Admit: 2016-12-06 | Discharge: 2016-12-06 | Disposition: A | Discharge status: Home / Self Care | Payer: Medicare Other | Source: Ambulatory Visit | Attending: Family Medicine | Admitting: Family Medicine

## 2016-12-06 DIAGNOSIS — Z1231 Encounter for screening mammogram for malignant neoplasm of breast: Secondary | ICD-10-CM | POA: Diagnosis not present

## 2016-12-06 DIAGNOSIS — Z780 Asymptomatic menopausal state: Secondary | ICD-10-CM | POA: Diagnosis not present

## 2016-12-06 DIAGNOSIS — M8589 Other specified disorders of bone density and structure, multiple sites: Secondary | ICD-10-CM | POA: Diagnosis not present

## 2016-12-06 DIAGNOSIS — E2839 Other primary ovarian failure: Secondary | ICD-10-CM

## 2016-12-10 ENCOUNTER — Encounter: Payer: Self-pay | Admitting: *Deleted

## 2016-12-11 DIAGNOSIS — B370 Candidal stomatitis: Secondary | ICD-10-CM | POA: Diagnosis not present

## 2016-12-11 DIAGNOSIS — L438 Other lichen planus: Secondary | ICD-10-CM | POA: Diagnosis not present

## 2016-12-11 DIAGNOSIS — Z79899 Other long term (current) drug therapy: Secondary | ICD-10-CM | POA: Diagnosis not present

## 2017-01-01 ENCOUNTER — Ambulatory Visit (INDEPENDENT_AMBULATORY_CARE_PROVIDER_SITE_OTHER): Payer: Medicare Other | Admitting: Internal Medicine

## 2017-01-01 ENCOUNTER — Encounter: Payer: Self-pay | Admitting: Internal Medicine

## 2017-01-01 ENCOUNTER — Telehealth: Payer: Self-pay | Admitting: Family Medicine

## 2017-01-01 VITALS — BP 122/70 | HR 78 | Temp 97.6°F | Wt 180.0 lb

## 2017-01-01 DIAGNOSIS — A6920 Lyme disease, unspecified: Secondary | ICD-10-CM | POA: Diagnosis not present

## 2017-01-01 DIAGNOSIS — S70362A Insect bite (nonvenomous), left thigh, initial encounter: Secondary | ICD-10-CM | POA: Diagnosis not present

## 2017-01-01 DIAGNOSIS — W57XXXA Bitten or stung by nonvenomous insect and other nonvenomous arthropods, initial encounter: Secondary | ICD-10-CM | POA: Diagnosis not present

## 2017-01-01 MED ORDER — CEFUROXIME AXETIL 500 MG PO TABS: 500.0000 mg | ORAL_TABLET | Freq: Two times a day (BID) | ORAL | 0 refills | Status: DC

## 2017-01-01 NOTE — Telephone Encounter (Signed)
Pt has appt 01/01/17 at 1:45 with Avie Echevaria NP.

## 2017-01-01 NOTE — Progress Notes (Signed)
Subjective:    Patient ID: Linda Mcgrath, female    DOB: Jul 25, 1933, 83 y.o.   MRN: [id number]  HPI  Pt presents to the clinic today with c/o tick bite. She reports she pulled it off of her leg yesterday. She is not sure how long it had been there. The tick was engorged. Shortly after pulling it off, she noticed a red bull's eye ring around it. The area is warm to touch. She denies fever or rash. She has not tried anything OTC.  Review of Systems  Past Medical History:  Diagnosis Date  . Allergic rhinitis   . Cataract    Bil/lens implant  . Colon polyp 1999   small polyp  . Diverticulosis   . Fibromyalgia   . Hyperlipidemia   . Hypertension   . Hypothyroidism   . Internal hemorrhoids   . Leg cramps   . Lung nodule    stable LUL 9 mm  . Menopausal syndrome   . Osteoarthritis   . UTI (urinary tract infection)     Current Outpatient Prescriptions  Medication Sig Dispense Refill  . Ascorbic Acid (VITAMIN C) 1000 MG tablet Take 1,000 mg by mouth daily.      Marland Kitchen aspirin 81 MG tablet Take 81 mg by mouth daily.    . B Complex Vitamins (VITAMIN B COMPLEX PO) Take 1 tablet by mouth daily.    . Biotin 10 MG TABS Take 1 tablet by mouth every morning.     . Calcium Carbonate-Vit D-Min 600-400 MG-UNIT TABS Take 1 tablet by mouth every morning.     . chlorhexidine (PERIDEX) 0.12 % solution Use as directed 5-15 mLs in the mouth or throat daily as needed (for breakouts). Reported on 03/06/2016    . Cranberry 500 MG CAPS Take 1 capsule by mouth daily.     . cyclobenzaprine (FLEXERIL) 10 MG tablet Take 1 tablet (10 mg total) by mouth at bedtime as needed. 90 tablet 0  . estradiol (ESTRACE) 1 MG tablet Take 1 tablet (1 mg total) by mouth daily. 90 tablet 3  . fluticasone (FLONASE) 50 MCG/ACT nasal spray Place 1 spray into both nostrils daily as needed for allergies or rhinitis.    . folic acid (FOLVITE) 1 MG tablet Take 1 mg by mouth daily.    Marland Kitchen ibuprofen (ADVIL,MOTRIN) 200 MG tablet  Take 400 mg by mouth at bedtime. For leg pain/discomfort    . levothyroxine (SYNTHROID, LEVOTHROID) 88 MCG tablet Take 1 tablet (88 mcg total) by mouth daily with breakfast. 90 tablet 3  . Lutein 20 MG CAPS Take 1 capsule by mouth daily.     . methotrexate (RHEUMATREX) 2.5 MG tablet Take 7.5 mg by mouth every 7 (seven) days. [facility])    . montelukast (SINGULAIR) 10 MG tablet Take 1 tablet (10 mg total) by mouth at bedtime. 90 tablet 3  . Multiple Vitamin (MULTIVITAMIN) capsule Take 1 capsule by mouth daily.      . Pumpkin Seed-Soy Germ (AZO BLADDER CONTROL/GO-LESS PO) Take 310 mg by mouth 3 (three) times daily.    . TURMERIC PO Take 1 capsule by mouth 2 (two) times daily.     . vitamin B-12 (CYANOCOBALAMIN) 500 MCG tablet Take 500 mcg by mouth daily.     No current facility-administered medications for this visit.     Allergies  Allergen Reactions  . Shellfish Allergy Anaphylaxis and Nausea And Vomiting  . Tetracycline Hives, Nausea And Vomiting, Rash and Other (See Comments)  SYSTEMIC REACTION: heart palpitations,muscle spasms, vomiting  . Amlodipine Besylate     REACTION: muscle spasms, extremity swelling, low bp  . Ciprofloxacin     REACTION: muscle spasms, insomnia  . Codeine     REACTION: hallucinations  . Propoxyphene Hcl   . Sulfamethoxazole-Trimethoprim Nausea Only and Other (See Comments)    REACTION: dizzy, could not focus eyes and could not concentrate and nausea  . Tape Other (See Comments)    BANDAIDS TAKE OFF HER SKIN; PLEASE USE COBAN OR PAPER   . Xarelto [Rivaroxaban]     Aches and pains and nervousness  . Amoxicillin Rash  . 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 Penicillins Swelling and Rash    Has patient had a PCN reaction causing immediate rash, facial/tongue/throat swelling, SOB or  lightheadedness with hypotension: Yes Has patient had a PCN reaction causing severe rash involving mucus membranes or skin necrosis: No Has patient had a PCN reaction that required hospitalization No Has patient had a PCN reaction occurring within the last 10 years: No If all of the above answers are "NO", then may proceed with Cephalosporin use.     Family History  Problem Relation Age of Onset  . Parkinsonism Father   . Colon cancer Brother   . Allergies Mother   . Heart disease Mother   . Kidney failure Son     congenital    Social History   Social History  . Marital status: Married    Spouse name: N/A  . Number of children: 2  . Years of education: N/A   Occupational History  . TRAVEL AGENT [employer]   Social History Main Topics  . Smoking status: Never Smoker  . Smokeless tobacco: Never Used  . Alcohol use No     Comment: occasional-rare  . Drug use: No  . Sexual activity: No   Other Topics Concern  . Not on file   Social History Narrative   Non-smoker      occ alcohol      Married-husband does the cooking      Works outside the home     Constitutional: Denies fever, malaise, fatigue, headache or abrupt weight changes.  Skin: Pt reports tick bite. Denies ulcercations.   No other specific complaints in a complete review of systems (except as listed in HPI above).     Objective:   Physical Exam  BP 122/70   Pulse 78   Temp 97.6 F (36.4 C) (Oral)   Wt 180 lb (81.6 kg)   LMP 08/26/1969   SpO2 97%   BMI 32.66 kg/m  Wt Readings from Last 3 Encounters:  01/01/17 180 lb (81.6 kg)  11/15/16 176 lb 8 oz (80.1 kg)  11/15/16 176 lb 8 oz (80.1 kg)    General: Appears her stated age, in NAD. Skin: 3 cm round erythema migrans noted on left lateral thigh.   BMET    Component Value Date/Time   NA 139 11/15/2016 1134   K 4.7 11/15/2016 1134   CL 106 11/15/2016 1134   CO2 27 11/15/2016 1134   GLUCOSE 88 11/15/2016 1134   BUN 16 11/15/2016  1134   CREATININE 0.96 11/15/2016 1134   CREATININE 1.00 (H) 03/13/2016 1119   CALCIUM 9.3 11/15/2016 1134   GFRNONAA 51 (L) 03/29/2016 0800  GFRAA 59 (L) 03/29/2016 0800    Lipid Panel     Component Value Date/Time   CHOL 196 11/15/2016 1134   TRIG 75.0 11/15/2016 1134   HDL 67.10 11/15/2016 1134   CHOLHDL 3 11/15/2016 1134   VLDL 15.0 11/15/2016 1134   LDLCALC 114 (H) 11/15/2016 1134    CBC    Component Value Date/Time   WBC 7.5 11/15/2016 1134   RBC 4.33 11/15/2016 1134   HGB 13.5 11/15/2016 1134   HCT 40.5 11/15/2016 1134   PLT 361.0 11/15/2016 1134   MCV 93.6 11/15/2016 1134   MCH 32.1 03/29/2016 0800   MCHC 33.2 11/15/2016 1134   RDW 13.6 11/15/2016 1134   LYMPHSABS 2.6 11/15/2016 1134   MONOABS 0.6 11/15/2016 1134   EOSABS 0.3 11/15/2016 1134   BASOSABS 0.1 11/15/2016 1134    Hgb A1C Lab Results  Component Value Date   HGBA1C 5.4 11/15/2016      '     Assessment & Plan:   Erythema Migrans,  Secondary to Tick Bite of Leg:  Allergic to Tetracycline and Amoxicillin (tolerates Amoxil better) eRx for Cefuroxime 500 mg BID x 20 days, according to UpToDate Warm compresses for comfort  Return precautions discussed Nicki Reaper, NP

## 2017-01-01 NOTE — Patient Instructions (Signed)
Tick Bite Information Introduction Ticks are insects that attach themselves to the skin. There are many types of ticks. Common types include wood ticks and deer ticks. Sometimes, ticks carry diseases that can make a person very ill. The most common places for ticks to attach themselves are the scalp, neck, armpits, waist, and groin. HOW CAN YOU PREVENT TICK BITES? Take these steps to help prevent tick bites when you are outdoors:  Wear long sleeves and long pants.  Wear white clothes so you can see ticks more easily.  Tuck your pant legs into your socks.  If walking on a trail, stay in the middle of the trail to avoid brushing against bushes.  Avoid walking through areas with long grass.  Put bug spray on all skin that is showing and along boot tops, pant legs, and sleeve cuffs.  Check clothes, hair, and skin often and before going inside.  Brush off any ticks that are not attached.  Take a shower or bath as soon as possible after being outdoors. HOW SHOULD YOU REMOVE A TICK? Ticks should be removed as soon as possible to help prevent diseases. 1. If latex gloves are available, put them on before trying to remove a tick. 2. Use tweezers to grasp the tick as close to the skin as possible. You may also use curved forceps or a tick removal tool. Grasp the tick as close to its head as possible. Avoid grasping the tick on its body. 3. Pull gently upward until the tick lets go. Do not twist the tick or jerk it suddenly. This may break off the tick's head or mouth parts. 4. Do not squeeze or crush the tick's body. This could force disease-carrying fluids from the tick into your body. 5. After the tick is removed, wash the bite area and your hands with soap and water or alcohol. 6. Apply a small amount of antiseptic cream or ointment to the bite site. 7. Wash any tools that were used. Do not try to remove a tick by applying a hot match, petroleum jelly, or fingernail polish to the tick. These  methods do not work. They may also increase the chances of disease being spread from the tick bite. WHEN SHOULD YOU SEEK HELP? Contact your health care provider if you are unable to remove a tick or if a part of the tick breaks off in the skin. After a tick bite, you need to watch for signs and symptoms of diseases that can be spread by ticks. Contact your health care provider if you develop any of the following:  Fever.  Rash.  Redness and puffiness (swelling) in the area of the tick bite.  Tender, puffy lymph glands.  Watery poop (diarrhea).  Weight loss.  Cough.  Feeling more tired than normal (fatigue).  Muscle, joint, or bone pain.  Belly (abdominal) pain.  Headache.  Change in your level of consciousness.  Trouble walking or moving your legs.  Loss of feeling (numbness) in the legs.  Loss of movement (paralysis).  Shortness of breath.  Confusion.  Throwing up (vomiting) many times. This information is not intended to replace advice given to you by your health care provider. Make sure you discuss any questions you have with your health care provider. Document Released: 11/06/2009 Document Revised: 01/18/2016 Document Reviewed: 01/20/2013 Elsevier Interactive Patient Education  2017 [employer]  

## 2017-01-01 NOTE — Telephone Encounter (Signed)
EASE NOTE: All timestamps contained within this report are represented as [place] Standard Time. CONFIDENTIALTY NOTICE: This fax transmission is intended only for the addressee. It contains information that is legally privileged, confidential or otherwise protected from use or disclosure. If you are not the intended recipient, you are strictly prohibited from reviewing, disclosing, copying using or disseminating any of this information or taking any action in reliance on or regarding this information. If you have received this fax in error, please notify us immediately by telephone so that we can arrange for its return to [place]. Phone: [phone], Toll-Free: [phone], Fax: [phone] Page: 1 of 1 Call Id: [id number] [place] Patient Name: Linda Mcgrath INS DOB: Jan 05, 1933 Initial Comment Caller had a tick bite Nurse Assessment Nurse: Vallery Sa, RN, Cathy Date/Time (Eastern Time): 01/01/2017 10:25:11 AM Confirm and document reason for call. If symptomatic, describe symptoms. ---Roberto Scales states she removed a small tick from her left leg yesterday and she developed a Bull's Eye Ring in the area yesterday. No fever. Alert and responsive. Does the patient have any new or worsening symptoms? ---Yes Will a triage be completed? ---Yes Related visit to physician within the last 2 weeks? ---No Does the PT have any chronic conditions? (i.e. diabetes, asthma, etc.) ---Yes List chronic conditions. ---Previous complications from past tick bite 2015 Is this a behavioral health or substance abuse call? ---No Guidelines Guideline Title Affirmed Question Affirmed Notes Tick Bite Red ring or bull's-eye rash occurs at tick bite Final Disposition User See Physician within [place] Comments Scheduled for 1:45pm appointment with [facility] today. Referrals REFERRED TO PCP  OFFICE Disagree/Comply: Comply

## 2017-01-08 DIAGNOSIS — H43813 Vitreous degeneration, bilateral: Secondary | ICD-10-CM | POA: Diagnosis not present

## 2017-01-08 DIAGNOSIS — H33302 Unspecified retinal break, left eye: Secondary | ICD-10-CM | POA: Diagnosis not present

## 2017-01-28 ENCOUNTER — Other Ambulatory Visit: Payer: Self-pay | Admitting: Family Medicine

## 2017-01-28 ENCOUNTER — Ambulatory Visit (INDEPENDENT_AMBULATORY_CARE_PROVIDER_SITE_OTHER): Payer: Medicare Other | Admitting: Family Medicine

## 2017-01-28 ENCOUNTER — Encounter: Payer: Self-pay | Admitting: Family Medicine

## 2017-01-28 VITALS — BP 120/88 | HR 78 | Temp 97.9°F | Ht 62.25 in | Wt 179.0 lb

## 2017-01-28 DIAGNOSIS — S80862A Insect bite (nonvenomous), left lower leg, initial encounter: Secondary | ICD-10-CM

## 2017-01-28 DIAGNOSIS — W57XXXA Bitten or stung by nonvenomous insect and other nonvenomous arthropods, initial encounter: Secondary | ICD-10-CM | POA: Insufficient documentation

## 2017-01-28 MED ORDER — CEFUROXIME AXETIL 500 MG PO TABS: 500.0000 mg | ORAL_TABLET | Freq: Two times a day (BID) | ORAL | 0 refills | Status: DC

## 2017-01-28 NOTE — Patient Instructions (Addendum)
We are going to empirically treat for lyme because of headache and fatigue The bite looks ok -keep watching for a bullseye shape  If it worsens-alert me  If fever or other symptoms -alert me  Clean with soap and water  Cortisone cream over the counter may help itch Also cool compress  Take the ceftin as directed (this can have a cross reaction with penicillins so - if you get a rash or side effects- stop it immediately and let me know)   Use your insect repellent and keep covered when outside

## 2017-01-28 NOTE — Progress Notes (Signed)
Subjective:    Patient ID: Linda Mcgrath, female    DOB: 10-16-1932, 83 y.o.   MRN: [id number]  HPI Here for a tick bite  Had a tick bite last Friday- on L lower leg  Red and swollen and itchy    Also one 2 weeks ago - same leg/different place - looking ok   Very tiny ticks-thinks these were deer ticks  She is outside a fair amt   No fever  Has had a headache and some fatigue  No rash that she can see   Patient Active Problem List   Diagnosis Date Noted  . Tick bite of calf 01/28/2017  . Abscess and cellulitis of gluteal region 08/07/2016  . Atrial flutter (HCC) 03/06/2016  . Severe headache 03/04/2016  . Obesity 11/06/2015  . Urge incontinence 01/24/2015  . Estrogen deficiency 10/25/2014  . Lichen planus 12/20/2013  . Encounter for Medicare annual wellness exam 06/17/2013  . Aphthous ulcer of mouth 03/16/2013  . Screening mammogram, encounter for 06/15/2012  . Hyperglycemia 06/03/2011  . HYPERTENSION, BENIGN ESSENTIAL 10/23/2010  . CONSTIPATION 10/23/2010  . HEMATOCHEZIA 10/23/2010  . OSTEOARTHRITIS, HANDS, BILATERAL 01/24/2010  . Hypothyroidism 08/24/2008  . Vitamin D deficiency 06/06/2008  . Hyperlipidemia 06/06/2008  . MENOPAUSAL SYNDROME 03/29/2008  . Osteopenia 03/29/2008  . ALLERGIC RHINITIS 01/25/2008  . IBS 01/25/2008  . OSTEOARTHRITIS 01/25/2008  . FIBROMYALGIA 01/25/2008   Past Medical History:  Diagnosis Date  . Allergic rhinitis   . Cataract    Bil/lens implant  . Colon polyp 1999   small polyp  . Diverticulosis   . Fibromyalgia   . Hyperlipidemia   . Hypertension   . Hypothyroidism   . Internal hemorrhoids   . Leg cramps   . Lung nodule    stable LUL 9 mm  . Menopausal syndrome   . Osteoarthritis   . UTI (urinary tract infection)    Past Surgical History:  Procedure Laterality Date  . Abd U/S  11/1998   negative  . Abd U/S  01/2001   gallbladder polyps  . ABDOMINAL HYSTERECTOMY  1975   total-fibroids  . APPENDECTOMY   1975  . BREAST BIOPSY     benign  . BREAST EXCISIONAL BIOPSY Left 1957  . CARDIOVERSION N/A 03/07/2016   Procedure: CARDIOVERSION;  Surgeon: Yates Decamp, MD;  Location: [facility] ENDOSCOPY;  Service: Cardiovascular;  Laterality: N/A;  . CATARACT EXTRACTION     bilateral  . CHOLECYSTECTOMY    . COLONOSCOPY  1998   Diverticulosis; polyp\  . COLONOSCOPY  09/2002   Diverticulosis, hem  . COLONOSCOPY  12/2007   diverticulosis, polyp  . DEXA  10/2001   osteopenia  . ELECTROPHYSIOLOGIC STUDY N/A 04/03/2016   Procedure: A-Flutter Ablation;  Surgeon: Will [name], MD;  Location: [facility] INVASIVE CV LAB;  Service: Cardiovascular;  Laterality: N/A;  . ESOPHAGOGASTRODUODENOSCOPY  2004  . Hida scan  01/2001   Negative  . KNEE SURGERY     right knee / 11/2004  . laser surgery for glaucoma Left Sep 21, 2014  . ROTATOR CUFF REPAIR     x 2 /right shoulder  . SKIN CANCER EXCISION     pre-melanoma / on face  . TEE WITHOUT CARDIOVERSION N/A 03/07/2016   Procedure: TRANSESOPHAGEAL ECHOCARDIOGRAM (TEE);  Surgeon: Yates Decamp, MD;  Location: [facility] ENDOSCOPY;  Service: Cardiovascular;  Laterality: N/A;   Social History  Substance Use Topics  . Smoking status: Never Smoker  . Smokeless tobacco: Never Used  .  Alcohol use No     Comment: occasional-rare   Family History  Problem Relation Age of Onset  . Parkinsonism Father   . Colon cancer Brother   . Allergies Mother   . Heart disease Mother   . Kidney failure Son        congenital   Allergies  Allergen Reactions  . Shellfish Allergy Anaphylaxis and Nausea And Vomiting  . Tetracycline Hives, Nausea And Vomiting, Rash and Other (See Comments)    SYSTEMIC REACTION: heart palpitations,muscle spasms, vomiting  . Amlodipine Besylate     REACTION: muscle spasms, extremity swelling, low bp  . Ciprofloxacin     REACTION: muscle spasms, insomnia  . Codeine     REACTION: hallucinations  . Propoxyphene Hcl   . Sulfamethoxazole-Trimethoprim Nausea Only and Other  (See Comments)    REACTION: dizzy, could not focus eyes and could not concentrate and nausea  . Tape Other (See Comments)    BANDAIDS TAKE OFF HER SKIN; PLEASE USE COBAN OR PAPER   . Xarelto [Rivaroxaban]     Aches and pains and nervousness  . Amoxicillin Rash  . 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 Penicillins Swelling and Rash    Has patient had a PCN reaction causing immediate rash, facial/tongue/throat swelling, SOB or lightheadedness with hypotension: Yes Has patient had a PCN reaction causing severe rash involving mucus membranes or skin necrosis: No Has patient had a PCN reaction that required hospitalization No Has patient had a PCN reaction occurring within the last 10 years: No If all of the above answers are "NO", then may proceed with Cephalosporin use.    Current Outpatient Prescriptions on File Prior to Visit  Medication Sig Dispense Refill  . Ascorbic Acid (VITAMIN C) 1000 MG tablet Take 1,000 mg by mouth daily.      Marland Kitchen aspirin 81 MG tablet Take 81 mg by mouth daily.    . B Complex Vitamins (VITAMIN B COMPLEX PO) Take 1 tablet by mouth daily.    . Biotin 10 MG TABS Take 1 tablet by mouth every morning.     . Calcium Carbonate-Vit D-Min 600-400 MG-UNIT TABS Take 1 tablet by mouth every morning.     . chlorhexidine (PERIDEX) 0.12 % solution Use as directed 5-15 mLs in the mouth or throat daily as needed (for breakouts). Reported on 03/06/2016    . Cranberry 500 MG CAPS Take 1 capsule by mouth daily.     . cyclobenzaprine (FLEXERIL) 10 MG tablet Take 1 tablet (10 mg total) by mouth at bedtime as needed. 90 tablet 0  . estradiol (ESTRACE) 1 MG tablet Take 1 tablet (1 mg total) by mouth daily. 90 tablet 3  . fluticasone (FLONASE) 50 MCG/ACT nasal spray Place 1 spray into both nostrils daily as needed for  allergies or rhinitis.    . folic acid (FOLVITE) 1 MG tablet Take 1 mg by mouth daily.    Marland Kitchen ibuprofen (ADVIL,MOTRIN) 200 MG tablet Take 400 mg by mouth at bedtime. For leg pain/discomfort    . levothyroxine (SYNTHROID, LEVOTHROID) 88 MCG tablet Take 1 tablet (88 mcg total) by mouth daily with breakfast. 90 tablet 3  . loratadine (CLARITIN) 10 MG tablet Take 10 mg by mouth daily.    . Lutein 20 MG CAPS Take 1 capsule by mouth  daily.     . methotrexate (RHEUMATREX) 2.5 MG tablet Take 7.5 mg by mouth every 7 (seven) days. [facility])    . montelukast (SINGULAIR) 10 MG tablet Take 1 tablet (10 mg total) by mouth at bedtime. 90 tablet 3  . Multiple Vitamin (MULTIVITAMIN) capsule Take 1 capsule by mouth daily.      . TURMERIC PO Take 1 capsule by mouth 2 (two) times daily.     . vitamin B-12 (CYANOCOBALAMIN) 500 MCG tablet Take 500 mcg by mouth daily.     No current facility-administered medications on file prior to visit.      Review of Systems  Constitutional: Positive for fatigue. Negative for activity change, appetite change, fever and unexpected weight change.  HENT: Negative for congestion, rhinorrhea, sore throat and trouble swallowing.   Eyes: Negative for pain, redness, itching and visual disturbance.  Respiratory: Negative for cough, chest tightness, shortness of breath and wheezing.   Cardiovascular: Negative for chest pain and palpitations.  Gastrointestinal: Negative for abdominal pain, blood in stool, constipation, diarrhea and nausea.  Endocrine: Negative for cold intolerance, heat intolerance, polydipsia and polyuria.  Genitourinary: Negative for difficulty urinating, dysuria, frequency and urgency.  Musculoskeletal: Negative for arthralgias, joint swelling and myalgias.  Skin: Negative for pallor and rash.       Pos for tick bite  Neurological: Positive for headaches. Negative for dizziness, tremors, weakness and numbness.  Hematological: Negative for adenopathy. Does not  bruise/bleed easily.  Psychiatric/Behavioral: Negative for decreased concentration and dysphoric mood. The patient is not nervous/anxious.        Objective:   Physical Exam  Constitutional: She appears well-developed and well-nourished. No distress.  overwt and well app  HENT:  Head: Normocephalic and atraumatic.  Nose: Nose normal.  Mouth/Throat: No oropharyngeal exudate.  Eyes: Conjunctivae and EOM are normal. Pupils are equal, round, and reactive to light. Right eye exhibits no discharge. Left eye exhibits no discharge. No scleral icterus.  Neck: Normal range of motion. Neck supple.  Cardiovascular: Normal rate and normal heart sounds.   Pulmonary/Chest: Effort normal and breath sounds normal. No respiratory distress. She has no wheezes.  Abdominal: Soft. Bowel sounds are normal.  Musculoskeletal: She exhibits no edema or tenderness.  Lymphadenopathy:    She has no cervical adenopathy.  Neurological: She is alert. She has normal reflexes. No cranial nerve deficit. She exhibits normal muscle tone. Coordination normal.  Skin: Skin is warm and dry. There is erythema.  2-3 cm oval area of erythema and induration with scab on med L lower ext  No rashes  No tick remains No other bites   Psychiatric: She has a normal mood and affect.          Assessment & Plan:   Problem List Items Addressed This Visit      Musculoskeletal and Integument   Tick bite of calf    L lower leg- 2-3 cm area of induration and erythema and scab No remaining tick Does not resemble erythema migrans  However pt c/o HA and fatigue-will emp cover for lyme She is tcn allergic Will tx with ceftin (aware there is poss cross rxn with pcn)-disc this in detail     Pt has many med allergies Alert if any issues  Watch for fever or rash  Wound care disc Tick avoidance disc

## 2017-01-28 NOTE — Assessment & Plan Note (Signed)
L lower leg- 2-3 cm area of induration and erythema and scab No remaining tick Does not resemble erythema migrans  However pt c/o HA and fatigue-will emp cover for lyme She is tcn allergic Will tx with ceftin (aware there is poss cross rxn with pcn)-disc this in detail     Pt has many med allergies Alert if any issues  Watch for fever or rash  Wound care disc Tick avoidance disc

## 2017-02-06 ENCOUNTER — Other Ambulatory Visit: Payer: Self-pay | Admitting: Family Medicine

## 2017-03-04 ENCOUNTER — Ambulatory Visit (INDEPENDENT_AMBULATORY_CARE_PROVIDER_SITE_OTHER)
Admission: RE | Admit: 2017-03-04 | Discharge: 2017-03-04 | Disposition: A | Discharge status: Home / Self Care | Payer: Medicare Other | Source: Ambulatory Visit | Attending: Family Medicine | Admitting: Family Medicine

## 2017-03-04 ENCOUNTER — Ambulatory Visit (INDEPENDENT_AMBULATORY_CARE_PROVIDER_SITE_OTHER): Payer: Medicare Other | Admitting: Family Medicine

## 2017-03-04 ENCOUNTER — Encounter: Payer: Self-pay | Admitting: Family Medicine

## 2017-03-04 VITALS — BP 130/78 | HR 64 | Temp 98.4°F | Ht 62.25 in | Wt 179.2 lb

## 2017-03-04 DIAGNOSIS — M5441 Lumbago with sciatica, right side: Secondary | ICD-10-CM | POA: Diagnosis not present

## 2017-03-04 DIAGNOSIS — M545 Low back pain, unspecified: Secondary | ICD-10-CM | POA: Insufficient documentation

## 2017-03-04 LAB — POC URINALSYSI DIPSTICK (AUTOMATED)
Bilirubin, UA: NEGATIVE
Blood, UA: NEGATIVE
Glucose, UA: NEGATIVE
Ketones, UA: NEGATIVE
Leukocytes, UA: NEGATIVE
Nitrite, UA: NEGATIVE
Protein, UA: NEGATIVE
Spec Grav, UA: 1.015 (ref 1.010–1.025)
Urobilinogen, UA: 0.2 E.U./dL
pH, UA: 6.5 (ref 5.0–8.0)

## 2017-03-04 MED ORDER — CYCLOBENZAPRINE HCL 10 MG PO TABS: 10.0000 mg | ORAL_TABLET | Freq: Every evening | ORAL | 3 refills | Status: DC | PRN

## 2017-03-04 MED ORDER — MONTELUKAST SODIUM 10 MG PO TABS: 10.0000 mg | ORAL_TABLET | Freq: Every day | ORAL | 3 refills | Status: DC

## 2017-03-04 NOTE — Progress Notes (Signed)
Subjective:    Patient ID: Linda Mcgrath, female    DOB: 08/28/1932, 83 y.o.   MRN: [id number]  HPI  83 yo female with hx of OA and osteopenia  here for low back pain   Pain only occurs when lying down  R lumbar- intense very sharp pain that intensifies until she needs to get up and walk around  Has tried an advil prn  Up every 2 hours at least  Sometimes she can sleep on L side  Wonders if she has a pinched nerve   It stated gradually about 2 weeks ago  Gradually worsening  Pain radiates to buttock (not leg), and occ in her groin area   No change in urine incontinence    ua is neg today Results for orders placed or performed in visit on 03/04/17  POCT Urinalysis Dipstick (Automated)  Result Value Ref Range   Color, UA Yellow    Clarity, UA Clear    Glucose, UA Negative    Bilirubin, UA Negative    Ketones, UA Negative    Spec Grav, UA 1.015 1.010 - 1.025   Blood, UA Negative    pH, UA 6.5 5.0 - 8.0   Protein, UA Negative    Urobilinogen, UA 0.2 0.2 or 1.0 E.U./dL   Nitrite, UA Negative    Leukocytes, UA Negative Negative    Patient Active Problem List   Diagnosis Date Noted  . Low back pain with right-sided sciatica 03/04/2017  . Tick bite of calf 01/28/2017  . Abscess and cellulitis of gluteal region 08/07/2016  . Atrial flutter (HCC) 03/06/2016  . Severe headache 03/04/2016  . Obesity 11/06/2015  . Urge incontinence 01/24/2015  . Estrogen deficiency 10/25/2014  . Lichen planus 12/20/2013  . Encounter for Medicare annual wellness exam 06/17/2013  . Aphthous ulcer of mouth 03/16/2013  . Screening mammogram, encounter for 06/15/2012  . Hyperglycemia 06/03/2011  . HYPERTENSION, BENIGN ESSENTIAL 10/23/2010  . CONSTIPATION 10/23/2010  . HEMATOCHEZIA 10/23/2010  . OSTEOARTHRITIS, HANDS, BILATERAL 01/24/2010  . Hypothyroidism 08/24/2008  . Vitamin D deficiency 06/06/2008  . Hyperlipidemia 06/06/2008  . MENOPAUSAL SYNDROME 03/29/2008  . Osteopenia  03/29/2008  . ALLERGIC RHINITIS 01/25/2008  . IBS 01/25/2008  . OSTEOARTHRITIS 01/25/2008  . FIBROMYALGIA 01/25/2008   Past Medical History:  Diagnosis Date  . Allergic rhinitis   . Cataract    Bil/lens implant  . Colon polyp 1999   small polyp  . Diverticulosis   . Fibromyalgia   . Hyperlipidemia   . Hypertension   . Hypothyroidism   . Internal hemorrhoids   . Leg cramps   . Lung nodule    stable LUL 9 mm  . Menopausal syndrome   . Osteoarthritis   . UTI (urinary tract infection)    Past Surgical History:  Procedure Laterality Date  . Abd U/S  11/1998   negative  . Abd U/S  01/2001   gallbladder polyps  . ABDOMINAL HYSTERECTOMY  1975   total-fibroids  . APPENDECTOMY  1975  . BREAST BIOPSY     benign  . BREAST EXCISIONAL BIOPSY Left 1957  . CARDIOVERSION N/A 03/07/2016   Procedure: CARDIOVERSION;  Surgeon: Yates Decamp, MD;  Location: [facility] ENDOSCOPY;  Service: Cardiovascular;  Laterality: N/A;  . CATARACT EXTRACTION     bilateral  . CHOLECYSTECTOMY    . COLONOSCOPY  1998   Diverticulosis; polyp\  . COLONOSCOPY  09/2002   Diverticulosis, hem  . COLONOSCOPY  12/2007   diverticulosis, polyp  .  DEXA  10/2001   osteopenia  . ELECTROPHYSIOLOGIC STUDY N/A 04/03/2016   Procedure: A-Flutter Ablation;  Surgeon: Will [name], MD;  Location: [facility] INVASIVE CV LAB;  Service: Cardiovascular;  Laterality: N/A;  . ESOPHAGOGASTRODUODENOSCOPY  2004  . Hida scan  01/2001   Negative  . KNEE SURGERY     right knee / 11/2004  . laser surgery for glaucoma Left Sep 21, 2014  . ROTATOR CUFF REPAIR     x 2 /right shoulder  . SKIN CANCER EXCISION     pre-melanoma / on face  . TEE WITHOUT CARDIOVERSION N/A 03/07/2016   Procedure: TRANSESOPHAGEAL ECHOCARDIOGRAM (TEE);  Surgeon: Yates Decamp, MD;  Location: [facility] ENDOSCOPY;  Service: Cardiovascular;  Laterality: N/A;   Social History  Substance Use Topics  . Smoking status: Never Smoker  . Smokeless tobacco: Never Used  . Alcohol use No      Comment: occasional-rare   Family History  Problem Relation Age of Onset  . Parkinsonism Father   . Colon cancer Brother   . Allergies Mother   . Heart disease Mother   . Kidney failure Son        congenital   Allergies  Allergen Reactions  . Shellfish Allergy Anaphylaxis and Nausea And Vomiting  . Tetracycline Hives, Nausea And Vomiting, Rash and Other (See Comments)    SYSTEMIC REACTION: heart palpitations,muscle spasms, vomiting  . Amlodipine Besylate     REACTION: muscle spasms, extremity swelling, low bp  . Ciprofloxacin     REACTION: muscle spasms, insomnia  . Codeine     REACTION: hallucinations  . Propoxyphene Hcl   . Sulfamethoxazole-Trimethoprim Nausea Only and Other (See Comments)    REACTION: dizzy, could not focus eyes and could not concentrate and nausea  . Tape Other (See Comments)    BANDAIDS TAKE OFF HER SKIN; PLEASE USE COBAN OR PAPER   . Xarelto [Rivaroxaban]     Aches and pains and nervousness  . Amoxicillin Rash  . 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 Penicillins Swelling and Rash    Has patient had a PCN reaction causing immediate rash, facial/tongue/throat swelling, SOB or lightheadedness with hypotension: Yes Has patient had a PCN reaction causing severe rash involving mucus membranes or skin necrosis: No Has patient had a PCN reaction that required hospitalization No Has patient had a PCN reaction occurring within the last 10 years: No If all of the above answers are "NO", then may proceed with Cephalosporin use.    Current Outpatient Prescriptions on File Prior to Visit  Medication Sig Dispense Refill  . Ascorbic Acid (VITAMIN C) 1000 MG tablet Take 1,000 mg by mouth daily.      Marland Kitchen aspirin 81 MG tablet Take 81 mg by mouth daily.    . B Complex Vitamins (VITAMIN B COMPLEX PO)  Take 1 tablet by mouth daily.    . Biotin 10 MG TABS Take 1 tablet by mouth every morning.     . Calcium Carbonate-Vit D-Min 600-400 MG-UNIT TABS Take 1 tablet by mouth every morning.     . chlorhexidine (PERIDEX) 0.12 % solution Use as directed 5-15 mLs in the mouth or throat daily as needed (for breakouts). Reported on 03/06/2016    . Cranberry 500 MG CAPS Take 1 capsule by mouth daily.     Marland Kitchen  estradiol (ESTRACE) 1 MG tablet TAKE 1 TABLET (1 MG TOTAL) BY MOUTH DAILY. 90 tablet 2  . folic acid (FOLVITE) 1 MG tablet Take 1 mg by mouth daily.    Marland Kitchen ibuprofen (ADVIL,MOTRIN) 200 MG tablet Take 400 mg by mouth at bedtime. For leg pain/discomfort    . levothyroxine (SYNTHROID, LEVOTHROID) 88 MCG tablet Take 1 tablet (88 mcg total) by mouth daily with breakfast. 90 tablet 3  . loratadine (CLARITIN) 10 MG tablet Take 10 mg by mouth daily.    . Lutein 20 MG CAPS Take 1 capsule by mouth daily.     . methotrexate (RHEUMATREX) 2.5 MG tablet Take 7.5 mg by mouth every 7 (seven) days. [facility])    . Multiple Vitamin (MULTIVITAMIN) capsule Take 1 capsule by mouth daily.      . TURMERIC PO Take 1 capsule by mouth 2 (two) times daily.     . vitamin B-12 (CYANOCOBALAMIN) 500 MCG tablet Take 500 mcg by mouth daily.     No current facility-administered medications on file prior to visit.     Review of Systems Review of Systems  Constitutional: Negative for fever, appetite change, fatigue and unexpected weight change.  Eyes: Negative for pain and visual disturbance.  Respiratory: Negative for cough and shortness of breath.   Cardiovascular: Negative for cp or palpitations    Gastrointestinal: Negative for nausea, diarrhea and constipation.  Genitourinary: Negative for urgency and frequency. pos for baseline mixed incontinence Skin: Negative for pallor or rash   MSK pos for low back pain on R with rad to buttocks Neurological: Negative for weakness, light-headedness, numbness and headaches.  Hematological:  Negative for adenopathy. Does not bruise/bleed easily.  Psychiatric/Behavioral: Negative for dysphoric mood. The patient is not nervous/anxious.         Objective:   Physical Exam  Constitutional: She appears well-developed and well-nourished. No distress.  Well appearing   HENT:  Head: Normocephalic and atraumatic.  Eyes: Conjunctivae and EOM are normal. Pupils are equal, round, and reactive to light. No scleral icterus.  Neck: Normal range of motion. Neck supple.  Cardiovascular: Normal rate and regular rhythm.   Pulmonary/Chest: Effort normal and breath sounds normal. She has no wheezes. She has no rales.  Abdominal: Soft. Bowel sounds are normal. She exhibits no distension. There is no tenderness.  Musculoskeletal: She exhibits tenderness.       Right shoulder: She exhibits tenderness, bony tenderness and pain. She exhibits normal range of motion, no swelling, no effusion, no crepitus, no deformity, normal pulse and normal strength.       Lumbar back: She exhibits decreased range of motion, tenderness and spasm. She exhibits no bony tenderness and no edema.  Tender in R upper lumbar musculature and piriformis area  No spinous process tenderness Nl rom with pain on full extension and R lateral bend  More pain when supine Neg SLR Nl rom of hips  Some pain with R piriformis stretch Nl gait   Lymphadenopathy:    She has no cervical adenopathy.  Neurological: She is alert. She has normal strength and normal reflexes. She displays no atrophy. No cranial nerve deficit or sensory deficit. She exhibits normal muscle tone. Coordination normal.  Negative SLR  Skin: Skin is warm and dry. No rash noted. No erythema. No pallor.  Psychiatric: She has a normal mood and affect.          Assessment & Plan:   Problem List Items Addressed This Visit  Nervous and Auditory   Low back pain with right-sided sciatica    R sided low back and piriformis area pain with radiation to  buttocks No neuro changes on exam LS film Consider PT Flexeril qhs with caution Disc some back stretches  Pend rad rev If worse symptoms/neuro changes - alert       Relevant Medications   cyclobenzaprine (FLEXERIL) 10 MG tablet   Other Relevant Orders   DG Lumbar Spine Complete    Other Visit Diagnoses    Low back pain, unspecified back pain laterality, unspecified chronicity, with sciatica presence unspecified    -  Primary   Relevant Medications   cyclobenzaprine (FLEXERIL) 10 MG tablet   Other Relevant Orders   POCT Urinalysis Dipstick (Automated) (Completed)

## 2017-03-04 NOTE — Patient Instructions (Signed)
Try heat and ice on your back when it bothers you - 10 minutes at a time  Try sleeping on your left side with a pillow between bent knees or sleep sitting up  Try the flexeril muscle relaxer at bedtime (caution for sedation and falls)   Xray now  We will have a result tomorrow and make a plan

## 2017-03-04 NOTE — Assessment & Plan Note (Addendum)
R sided low back and piriformis area pain with radiation to buttocks No neuro changes on exam LS film Consider PT Flexeril qhs with caution Disc some back stretches  Pend rad rev If worse symptoms/neuro changes - alert

## 2017-03-06 ENCOUNTER — Telehealth: Payer: Self-pay | Admitting: Family Medicine

## 2017-03-06 DIAGNOSIS — M5441 Lumbago with sciatica, right side: Secondary | ICD-10-CM

## 2017-03-06 NOTE — Telephone Encounter (Signed)
-----   Message from Tammi Sou, [place] sent at 03/06/2017  9:54 AM EDT ----- Pt notified of xray results and Dr. Marliss Coots recommendations. Pt does agree with PT, please put referral in and I advise pt our [facility] will call to schedule appt

## 2017-03-06 NOTE — Telephone Encounter (Signed)
Ref done  Will route to [facility] 

## 2017-03-13 NOTE — Telephone Encounter (Signed)
PT set up and patient is aware.

## 2017-03-17 ENCOUNTER — Ambulatory Visit (INDEPENDENT_AMBULATORY_CARE_PROVIDER_SITE_OTHER): Payer: Medicare Other | Admitting: Family Medicine

## 2017-03-17 ENCOUNTER — Encounter: Payer: Self-pay | Admitting: Family Medicine

## 2017-03-17 ENCOUNTER — Ambulatory Visit: Payer: Medicare Other | Admitting: Family Medicine

## 2017-03-17 VITALS — BP 128/72 | HR 68 | Temp 97.7°F | Ht 62.25 in | Wt 180.5 lb

## 2017-03-17 DIAGNOSIS — H6982 Other specified disorders of Eustachian tube, left ear: Secondary | ICD-10-CM | POA: Diagnosis not present

## 2017-03-17 DIAGNOSIS — H6980 Other specified disorders of Eustachian tube, unspecified ear: Secondary | ICD-10-CM | POA: Insufficient documentation

## 2017-03-17 NOTE — Assessment & Plan Note (Signed)
Left side Suspect due to allergy congestion  Rev all tx and allergen avoidance  Add flonase - bid for 3 d and then qd If no imp may need prednisone  Disc s/s of infection-will update   Disc symptomatic care - see instructions on AVS

## 2017-03-17 NOTE — Progress Notes (Signed)
Subjective:    Patient ID: Linda Mcgrath, female    DOB: 09-14-1932, 83 y.o.   MRN: [id number]  HPI Here for ear pain  L ear  Going on for about a week - worse at night  Worse to lie on left side   Pain is sharp and dull depending   A little nasal congestion  Chronic sinus congestion  Takes claritin and mucinex Long hx of allergies  No steroid nasal sprays right now   No fever No st   Most of her nasal d/c is clear   Patient Active Problem List   Diagnosis Date Noted  . ETD (eustachian tube dysfunction) 03/17/2017  . Low back pain with right-sided sciatica 03/04/2017  . Tick bite of calf 01/28/2017  . Abscess and cellulitis of gluteal region 08/07/2016  . Atrial flutter (HCC) 03/06/2016  . Severe headache 03/04/2016  . Obesity 11/06/2015  . Urge incontinence 01/24/2015  . Estrogen deficiency 10/25/2014  . Lichen planus 12/20/2013  . Encounter for Medicare annual wellness exam 06/17/2013  . Aphthous ulcer of mouth 03/16/2013  . Screening mammogram, encounter for 06/15/2012  . Hyperglycemia 06/03/2011  . HYPERTENSION, BENIGN ESSENTIAL 10/23/2010  . CONSTIPATION 10/23/2010  . HEMATOCHEZIA 10/23/2010  . OSTEOARTHRITIS, HANDS, BILATERAL 01/24/2010  . Hypothyroidism 08/24/2008  . Vitamin D deficiency 06/06/2008  . Hyperlipidemia 06/06/2008  . MENOPAUSAL SYNDROME 03/29/2008  . Osteopenia 03/29/2008  . ALLERGIC RHINITIS 01/25/2008  . IBS 01/25/2008  . OSTEOARTHRITIS 01/25/2008  . FIBROMYALGIA 01/25/2008   Past Medical History:  Diagnosis Date  . Allergic rhinitis   . Cataract    Bil/lens implant  . Colon polyp 1999   small polyp  . Diverticulosis   . Fibromyalgia   . Hyperlipidemia   . Hypertension   . Hypothyroidism   . Internal hemorrhoids   . Leg cramps   . Lung nodule    stable LUL 9 mm  . Menopausal syndrome   . Osteoarthritis   . UTI (urinary tract infection)    Past Surgical History:  Procedure Laterality Date  . Abd U/S  11/1998   negative  . Abd U/S  01/2001   gallbladder polyps  . ABDOMINAL HYSTERECTOMY  1975   total-fibroids  . APPENDECTOMY  1975  . BREAST BIOPSY     benign  . BREAST EXCISIONAL BIOPSY Left 1957  . CARDIOVERSION N/A 03/07/2016   Procedure: CARDIOVERSION;  Surgeon: Yates Decamp, MD;  Location: [facility] ENDOSCOPY;  Service: Cardiovascular;  Laterality: N/A;  . CATARACT EXTRACTION     bilateral  . CHOLECYSTECTOMY    . COLONOSCOPY  1998   Diverticulosis; polyp\  . COLONOSCOPY  09/2002   Diverticulosis, hem  . COLONOSCOPY  12/2007   diverticulosis, polyp  . DEXA  10/2001   osteopenia  . ELECTROPHYSIOLOGIC STUDY N/A 04/03/2016   Procedure: A-Flutter Ablation;  Surgeon: Will [name], MD;  Location: [facility] INVASIVE CV LAB;  Service: Cardiovascular;  Laterality: N/A;  . ESOPHAGOGASTRODUODENOSCOPY  2004  . Hida scan  01/2001   Negative  . KNEE SURGERY     right knee / 11/2004  . laser surgery for glaucoma Left Sep 21, 2014  . ROTATOR CUFF REPAIR     x 2 /right shoulder  . SKIN CANCER EXCISION     pre-melanoma / on face  . TEE WITHOUT CARDIOVERSION N/A 03/07/2016   Procedure: TRANSESOPHAGEAL ECHOCARDIOGRAM (TEE);  Surgeon: Yates Decamp, MD;  Location: [facility] ENDOSCOPY;  Service: Cardiovascular;  Laterality: N/A;   Social History  Substance Use Topics  . Smoking status: Never Smoker  . Smokeless tobacco: Never Used  . Alcohol use No     Comment: occasional-rare   Family History  Problem Relation Age of Onset  . Parkinsonism Father   . Colon cancer Brother   . Allergies Mother   . Heart disease Mother   . Kidney failure Son        congenital   Allergies  Allergen Reactions  . Shellfish Allergy Anaphylaxis and Nausea And Vomiting  . Tetracycline Hives, Nausea And Vomiting, Rash and Other (See Comments)    SYSTEMIC REACTION: heart palpitations,muscle spasms, vomiting  . Amlodipine Besylate     REACTION: muscle spasms, extremity swelling, low bp  . Ciprofloxacin     REACTION: muscle spasms, insomnia    . Codeine     REACTION: hallucinations  . Propoxyphene Hcl   . Sulfamethoxazole-Trimethoprim Nausea Only and Other (See Comments)    REACTION: dizzy, could not focus eyes and could not concentrate and nausea  . Tape Other (See Comments)    BANDAIDS TAKE OFF HER SKIN; PLEASE USE COBAN OR PAPER   . Xarelto [Rivaroxaban]     Aches and pains and nervousness  . Amoxicillin Rash  . 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 Penicillins Swelling and Rash    Has patient had a PCN reaction causing immediate rash, facial/tongue/throat swelling, SOB or lightheadedness with hypotension: Yes Has patient had a PCN reaction causing severe rash involving mucus membranes or skin necrosis: No Has patient had a PCN reaction that required hospitalization No Has patient had a PCN reaction occurring within the last 10 years: No If all of the above answers are "NO", then may proceed with Cephalosporin use.    Current Outpatient Prescriptions on File Prior to Visit  Medication Sig Dispense Refill  . Ascorbic Acid (VITAMIN C) 1000 MG tablet Take 1,000 mg by mouth daily.      Marland Kitchen aspirin 81 MG tablet Take 81 mg by mouth daily.    . B Complex Vitamins (VITAMIN B COMPLEX PO) Take 1 tablet by mouth daily.    . Biotin 10 MG TABS Take 1 tablet by mouth every morning.     . Calcium Carbonate-Vit D-Min 600-400 MG-UNIT TABS Take 1 tablet by mouth every morning.     . chlorhexidine (PERIDEX) 0.12 % solution Use as directed 5-15 mLs in the mouth or throat daily as needed (for breakouts). Reported on 03/06/2016    . Cranberry 500 MG CAPS Take 1 capsule by mouth daily.     . cyclobenzaprine (FLEXERIL) 10 MG tablet Take 1 tablet (10 mg total) by mouth at bedtime as needed. 30 tablet 3  . estradiol (ESTRACE) 1 MG tablet TAKE 1 TABLET (1 MG TOTAL) BY MOUTH DAILY.  90 tablet 2  . Fexofenadine HCl (MUCINEX ALLERGY PO) Take 1 tablet by mouth daily as needed.    . folic acid (FOLVITE) 1 MG tablet Take 1 mg by mouth daily.    Marland Kitchen ibuprofen (ADVIL,MOTRIN) 200 MG tablet Take 400 mg by mouth at bedtime. For leg pain/discomfort    . levothyroxine (SYNTHROID, LEVOTHROID) 88 MCG tablet Take 1 tablet (88 mcg total) by mouth daily with breakfast. 90 tablet 3  . loratadine (CLARITIN) 10 MG tablet Take 10 mg by mouth daily.    Marland Kitchen  Lutein 20 MG CAPS Take 1 capsule by mouth daily.     . methotrexate (RHEUMATREX) 2.5 MG tablet Take 7.5 mg by mouth every 7 (seven) days. [facility])    . montelukast (SINGULAIR) 10 MG tablet Take 1 tablet (10 mg total) by mouth at bedtime. 90 tablet 3  . Multiple Vitamin (MULTIVITAMIN) capsule Take 1 capsule by mouth daily.      . TURMERIC PO Take 1 capsule by mouth 2 (two) times daily.     . vitamin B-12 (CYANOCOBALAMIN) 500 MCG tablet Take 500 mcg by mouth daily.     No current facility-administered medications on file prior to visit.     Review of Systems Review of Systems  Constitutional: Negative for fever, appetite change, fatigue and unexpected weight change.  Eyes: Negative for pain and visual disturbance.  ENT pos for L ear pain and pressure / pos for chronic nasal congestion/neg for sinus pain , neg for ear drainage Respiratory: Negative for cough and shortness of breath.   Cardiovascular: Negative for cp or palpitations    Gastrointestinal: Negative for nausea, diarrhea and constipation.  Genitourinary: Negative for urgency and frequency.  Skin: Negative for pallor or rash   Neurological: Negative for weakness, light-headedness, numbness and headaches.  Hematological: Negative for adenopathy. Does not bruise/bleed easily.  Psychiatric/Behavioral: Negative for dysphoric mood. The patient is not nervous/anxious.         Objective:   Physical Exam  Constitutional: She appears well-developed and well-nourished. No distress.    overwt and well appearing   HENT:  Head: Normocephalic and atraumatic.  Right Ear: External ear normal.  Mouth/Throat: Oropharynx is clear and moist.  Nares are injected and congested  (worse on the R) L TM is dull with small effusion   No sinus tenderness Clear pnd and rhinorrhea    Eyes: Pupils are equal, round, and reactive to light. Conjunctivae and EOM are normal. Right eye exhibits no discharge. Left eye exhibits no discharge.  Neck: Normal range of motion. Neck supple.  Cardiovascular: Normal rate and normal heart sounds.   Pulmonary/Chest: Effort normal and breath sounds normal. No respiratory distress. She has no wheezes.  Lymphadenopathy:    She has no cervical adenopathy.  Neurological: She is alert. No cranial nerve deficit.  Skin: Skin is warm and dry. No rash noted. No erythema.  Psychiatric: She has a normal mood and affect.          Assessment & Plan:   Problem List Items Addressed This Visit      Nervous and Auditory   ETD (eustachian tube dysfunction)    Left side Suspect due to allergy congestion  Rev all tx and allergen avoidance  Add flonase - bid for 3 d and then qd If no imp may need prednisone  Disc s/s of infection-will update   Disc symptomatic care - see instructions on AVS

## 2017-03-17 NOTE — Patient Instructions (Signed)
You have eustachian tube dysfunction in left ear due to allergy congestion  Continue current medicines Start flonase over the counter  Use 2 sprays in each nostril twice daily for 3 days Then go down to once daily and stay on it   Update if not starting to improve in a week or if worsening   If fever or increased sinus pain alert me also

## 2017-03-19 ENCOUNTER — Encounter: Payer: Self-pay | Admitting: Physical Therapy

## 2017-03-19 ENCOUNTER — Ambulatory Visit: Payer: Medicare Other | Attending: Family Medicine | Admitting: Physical Therapy

## 2017-03-19 DIAGNOSIS — M62830 Muscle spasm of back: Secondary | ICD-10-CM | POA: Diagnosis not present

## 2017-03-19 DIAGNOSIS — M5441 Lumbago with sciatica, right side: Secondary | ICD-10-CM | POA: Diagnosis not present

## 2017-03-19 DIAGNOSIS — R262 Difficulty in walking, not elsewhere classified: Secondary | ICD-10-CM | POA: Insufficient documentation

## 2017-03-19 NOTE — Therapy (Signed)
gluteal region 08/07/2016  . Atrial flutter ([place]) 03/06/2016  . Severe headache 03/04/2016  . Obesity 11/06/2015  . Urge incontinence 01/24/2015  . Estrogen deficiency 10/25/2014  . Lichen planus 63/89/3734  . Encounter for Medicare annual wellness exam 06/17/2013  . Aphthous ulcer of mouth 03/16/2013  . Screening mammogram, encounter for 06/15/2012  . Hyperglycemia 06/03/2011  . HYPERTENSION, BENIGN ESSENTIAL 10/23/2010  . CONSTIPATION 10/23/2010  . HEMATOCHEZIA 10/23/2010  . OSTEOARTHRITIS, HANDS,  BILATERAL 01/24/2010  . Hypothyroidism 08/24/2008  . Vitamin D deficiency 06/06/2008  . Hyperlipidemia 06/06/2008  . MENOPAUSAL SYNDROME 03/29/2008  . Osteopenia 03/29/2008  . ALLERGIC RHINITIS 01/25/2008  . IBS 01/25/2008  . OSTEOARTHRITIS 01/25/2008  . FIBROMYALGIA 01/25/2008    Lennart Pall, SPT 03/19/2017, [address] [place], [place], [address] Phone: [phone]   Fax:  [phone]  Name: Linda Mcgrath MRN: [id number] Date of Birth: Aug 26, 1933  [place], [place], [address] Phone: [phone]   Fax:  [phone]  Physical Therapy Evaluation  Patient Details  Name: Linda Mcgrath MRN: [id number] Date of Birth: 03-14-1933 Referring Provider: [name]  Encounter Date: 03/19/2017      PT End of Session - 03/19/17 1646    Visit Number 1   Date for PT Re-Evaluation 05/14/17   PT Start Time 1431   PT Stop Time 1530   PT Time Calculation (min) 59 min   Activity Tolerance Patient tolerated treatment well;Patient limited by pain   Behavior During Therapy [facility] for tasks assessed/performed      Past Medical History:  Diagnosis Date  . Allergic rhinitis   . Cataract    Bil/lens implant  . Colon polyp 1999   small polyp  . Diverticulosis   . Fibromyalgia   . Hyperlipidemia   . Hypertension   . Hypothyroidism   . Internal hemorrhoids   . Leg cramps   . Lung nodule    stable LUL 9 mm  . Menopausal syndrome   . Osteoarthritis   . UTI (urinary tract infection)     Past Surgical History:  Procedure Laterality Date  . Abd U/S  11/1998   negative  . Abd U/S  01/2001   gallbladder polyps  . ABDOMINAL HYSTERECTOMY  1975   total-fibroids  . APPENDECTOMY  1975  . BREAST BIOPSY     benign  . BREAST EXCISIONAL BIOPSY Left 1957  . CARDIOVERSION N/A 03/07/2016   Procedure: CARDIOVERSION;  Surgeon: Adrian Prows, MD;  Location: [facility];  Service: Cardiovascular;  Laterality: N/A;  . CATARACT EXTRACTION     bilateral  . CHOLECYSTECTOMY    . COLONOSCOPY  1998   Diverticulosis; polyp\  . COLONOSCOPY  09/2002   Diverticulosis, hem  . COLONOSCOPY  12/2007   diverticulosis, polyp  . DEXA  10/2001   osteopenia  . ELECTROPHYSIOLOGIC STUDY N/A 04/03/2016   Procedure: A-Flutter Ablation;  Surgeon: Will Meredith Leeds, MD;  Location: [place] CV LAB;  Service: Cardiovascular;  Laterality: N/A;  . ESOPHAGOGASTRODUODENOSCOPY  2004  . Hida  scan  01/2001   Negative  . KNEE SURGERY     right knee / 11/2004  . laser surgery for glaucoma Left Sep 21, 2014  . ROTATOR CUFF REPAIR     x 2 /right shoulder  . SKIN CANCER EXCISION     pre-melanoma / on face  . TEE WITHOUT CARDIOVERSION N/A 03/07/2016   Procedure: TRANSESOPHAGEAL ECHOCARDIOGRAM (TEE);  Surgeon: Adrian Prows, MD;  Location: [facility] ENDOSCOPY;  Service: Cardiovascular;  Laterality: N/A;    There were no vitals filed for this visit.       Subjective Assessment - 03/19/17 1434    Subjective Pt reports that she has been having low back pain for 3-4 weeks. She hasn't been able to sleep laying down for more than a few hours. She'll switch and sleep on an incline. She still does a little yardwork not as much as she used to, no lifting. Her son is moving in with her soon to help her.    Limitations Walking;House hold activities;Lifting   Diagnostic tests Xray - L5-S1 spondylolisthesis and bilateral pars defect at L5   Patient Stated Goals No more back pain.    Currently in Pain? Yes   Pain Score 0-No pain   Pain Location Back   Pain Orientation Lower;Right   Pain Descriptors /  [place], [place], [address] Phone: [phone]   Fax:  [phone]  Physical Therapy Evaluation  Patient Details  Name: Linda Mcgrath MRN: [id number] Date of Birth: 03-14-1933 Referring Provider: [name]  Encounter Date: 03/19/2017      PT End of Session - 03/19/17 1646    Visit Number 1   Date for PT Re-Evaluation 05/14/17   PT Start Time 1431   PT Stop Time 1530   PT Time Calculation (min) 59 min   Activity Tolerance Patient tolerated treatment well;Patient limited by pain   Behavior During Therapy [facility] for tasks assessed/performed      Past Medical History:  Diagnosis Date  . Allergic rhinitis   . Cataract    Bil/lens implant  . Colon polyp 1999   small polyp  . Diverticulosis   . Fibromyalgia   . Hyperlipidemia   . Hypertension   . Hypothyroidism   . Internal hemorrhoids   . Leg cramps   . Lung nodule    stable LUL 9 mm  . Menopausal syndrome   . Osteoarthritis   . UTI (urinary tract infection)     Past Surgical History:  Procedure Laterality Date  . Abd U/S  11/1998   negative  . Abd U/S  01/2001   gallbladder polyps  . ABDOMINAL HYSTERECTOMY  1975   total-fibroids  . APPENDECTOMY  1975  . BREAST BIOPSY     benign  . BREAST EXCISIONAL BIOPSY Left 1957  . CARDIOVERSION N/A 03/07/2016   Procedure: CARDIOVERSION;  Surgeon: Adrian Prows, MD;  Location: [facility];  Service: Cardiovascular;  Laterality: N/A;  . CATARACT EXTRACTION     bilateral  . CHOLECYSTECTOMY    . COLONOSCOPY  1998   Diverticulosis; polyp\  . COLONOSCOPY  09/2002   Diverticulosis, hem  . COLONOSCOPY  12/2007   diverticulosis, polyp  . DEXA  10/2001   osteopenia  . ELECTROPHYSIOLOGIC STUDY N/A 04/03/2016   Procedure: A-Flutter Ablation;  Surgeon: Will Meredith Leeds, MD;  Location: [place] CV LAB;  Service: Cardiovascular;  Laterality: N/A;  . ESOPHAGOGASTRODUODENOSCOPY  2004  . Hida  scan  01/2001   Negative  . KNEE SURGERY     right knee / 11/2004  . laser surgery for glaucoma Left Sep 21, 2014  . ROTATOR CUFF REPAIR     x 2 /right shoulder  . SKIN CANCER EXCISION     pre-melanoma / on face  . TEE WITHOUT CARDIOVERSION N/A 03/07/2016   Procedure: TRANSESOPHAGEAL ECHOCARDIOGRAM (TEE);  Surgeon: Adrian Prows, MD;  Location: [facility] ENDOSCOPY;  Service: Cardiovascular;  Laterality: N/A;    There were no vitals filed for this visit.       Subjective Assessment - 03/19/17 1434    Subjective Pt reports that she has been having low back pain for 3-4 weeks. She hasn't been able to sleep laying down for more than a few hours. She'll switch and sleep on an incline. She still does a little yardwork not as much as she used to, no lifting. Her son is moving in with her soon to help her.    Limitations Walking;House hold activities;Lifting   Diagnostic tests Xray - L5-S1 spondylolisthesis and bilateral pars defect at L5   Patient Stated Goals No more back pain.    Currently in Pain? Yes   Pain Score 0-No pain   Pain Location Back   Pain Orientation Lower;Right   Pain Descriptors /  [place], [place], [address] Phone: [phone]   Fax:  [phone]  Physical Therapy Evaluation  Patient Details  Name: Linda Mcgrath MRN: [id number] Date of Birth: 03-14-1933 Referring Provider: [name]  Encounter Date: 03/19/2017      PT End of Session - 03/19/17 1646    Visit Number 1   Date for PT Re-Evaluation 05/14/17   PT Start Time 1431   PT Stop Time 1530   PT Time Calculation (min) 59 min   Activity Tolerance Patient tolerated treatment well;Patient limited by pain   Behavior During Therapy [facility] for tasks assessed/performed      Past Medical History:  Diagnosis Date  . Allergic rhinitis   . Cataract    Bil/lens implant  . Colon polyp 1999   small polyp  . Diverticulosis   . Fibromyalgia   . Hyperlipidemia   . Hypertension   . Hypothyroidism   . Internal hemorrhoids   . Leg cramps   . Lung nodule    stable LUL 9 mm  . Menopausal syndrome   . Osteoarthritis   . UTI (urinary tract infection)     Past Surgical History:  Procedure Laterality Date  . Abd U/S  11/1998   negative  . Abd U/S  01/2001   gallbladder polyps  . ABDOMINAL HYSTERECTOMY  1975   total-fibroids  . APPENDECTOMY  1975  . BREAST BIOPSY     benign  . BREAST EXCISIONAL BIOPSY Left 1957  . CARDIOVERSION N/A 03/07/2016   Procedure: CARDIOVERSION;  Surgeon: Adrian Prows, MD;  Location: [facility];  Service: Cardiovascular;  Laterality: N/A;  . CATARACT EXTRACTION     bilateral  . CHOLECYSTECTOMY    . COLONOSCOPY  1998   Diverticulosis; polyp\  . COLONOSCOPY  09/2002   Diverticulosis, hem  . COLONOSCOPY  12/2007   diverticulosis, polyp  . DEXA  10/2001   osteopenia  . ELECTROPHYSIOLOGIC STUDY N/A 04/03/2016   Procedure: A-Flutter Ablation;  Surgeon: Will Meredith Leeds, MD;  Location: [place] CV LAB;  Service: Cardiovascular;  Laterality: N/A;  . ESOPHAGOGASTRODUODENOSCOPY  2004  . Hida  scan  01/2001   Negative  . KNEE SURGERY     right knee / 11/2004  . laser surgery for glaucoma Left Sep 21, 2014  . ROTATOR CUFF REPAIR     x 2 /right shoulder  . SKIN CANCER EXCISION     pre-melanoma / on face  . TEE WITHOUT CARDIOVERSION N/A 03/07/2016   Procedure: TRANSESOPHAGEAL ECHOCARDIOGRAM (TEE);  Surgeon: Adrian Prows, MD;  Location: [facility] ENDOSCOPY;  Service: Cardiovascular;  Laterality: N/A;    There were no vitals filed for this visit.       Subjective Assessment - 03/19/17 1434    Subjective Pt reports that she has been having low back pain for 3-4 weeks. She hasn't been able to sleep laying down for more than a few hours. She'll switch and sleep on an incline. She still does a little yardwork not as much as she used to, no lifting. Her son is moving in with her soon to help her.    Limitations Walking;House hold activities;Lifting   Diagnostic tests Xray - L5-S1 spondylolisthesis and bilateral pars defect at L5   Patient Stated Goals No more back pain.    Currently in Pain? Yes   Pain Score 0-No pain   Pain Location Back   Pain Orientation Lower;Right   Pain Descriptors /

## 2017-03-26 ENCOUNTER — Encounter: Payer: Self-pay | Admitting: Physical Therapy

## 2017-03-26 ENCOUNTER — Ambulatory Visit: Payer: Medicare Other | Attending: Family Medicine | Admitting: Physical Therapy

## 2017-03-26 DIAGNOSIS — R262 Difficulty in walking, not elsewhere classified: Secondary | ICD-10-CM | POA: Diagnosis not present

## 2017-03-26 DIAGNOSIS — M5441 Lumbago with sciatica, right side: Secondary | ICD-10-CM | POA: Diagnosis not present

## 2017-03-26 DIAGNOSIS — M62830 Muscle spasm of back: Secondary | ICD-10-CM | POA: Insufficient documentation

## 2017-03-26 NOTE — Therapy (Signed)
right-sided sciatica  Difficulty in walking, not elsewhere classified  Muscle spasm of back     Problem List Patient Active Problem List   Diagnosis Date Noted  . ETD (eustachian tube dysfunction) 03/17/2017  . Low back pain with right-sided sciatica 03/04/2017  . Tick bite of calf 01/28/2017  . Abscess and cellulitis of gluteal region 08/07/2016  . Atrial flutter ([place]) 03/06/2016  . Severe headache 03/04/2016  . Obesity 11/06/2015  . Urge incontinence 01/24/2015  . Estrogen deficiency 10/25/2014  . Lichen planus 37/62/8315  . Encounter for Medicare annual wellness exam 06/17/2013  . Aphthous ulcer of mouth 03/16/2013  . Screening mammogram, encounter for 06/15/2012  . Hyperglycemia 06/03/2011  . HYPERTENSION, BENIGN ESSENTIAL 10/23/2010  . CONSTIPATION 10/23/2010  . HEMATOCHEZIA 10/23/2010  . OSTEOARTHRITIS, HANDS, BILATERAL 01/24/2010  . Hypothyroidism 08/24/2008  . Vitamin D deficiency 06/06/2008  . Hyperlipidemia 06/06/2008  . MENOPAUSAL SYNDROME 03/29/2008  . Osteopenia 03/29/2008  . ALLERGIC RHINITIS 01/25/2008  . IBS 01/25/2008  . OSTEOARTHRITIS 01/25/2008  . FIBROMYALGIA 01/25/2008    Lennart Pall, SPT 03/26/2017, 11:49 AM  [place], [place], [address] Phone: [phone]   Fax:  [phone]  Name: Linda Mcgrath MRN: [id number] Date of Birth: 09-17-1932  right-sided sciatica  Difficulty in walking, not elsewhere classified  Muscle spasm of back     Problem List Patient Active Problem List   Diagnosis Date Noted  . ETD (eustachian tube dysfunction) 03/17/2017  . Low back pain with right-sided sciatica 03/04/2017  . Tick bite of calf 01/28/2017  . Abscess and cellulitis of gluteal region 08/07/2016  . Atrial flutter ([place]) 03/06/2016  . Severe headache 03/04/2016  . Obesity 11/06/2015  . Urge incontinence 01/24/2015  . Estrogen deficiency 10/25/2014  . Lichen planus 37/62/8315  . Encounter for Medicare annual wellness exam 06/17/2013  . Aphthous ulcer of mouth 03/16/2013  . Screening mammogram, encounter for 06/15/2012  . Hyperglycemia 06/03/2011  . HYPERTENSION, BENIGN ESSENTIAL 10/23/2010  . CONSTIPATION 10/23/2010  . HEMATOCHEZIA 10/23/2010  . OSTEOARTHRITIS, HANDS, BILATERAL 01/24/2010  . Hypothyroidism 08/24/2008  . Vitamin D deficiency 06/06/2008  . Hyperlipidemia 06/06/2008  . MENOPAUSAL SYNDROME 03/29/2008  . Osteopenia 03/29/2008  . ALLERGIC RHINITIS 01/25/2008  . IBS 01/25/2008  . OSTEOARTHRITIS 01/25/2008  . FIBROMYALGIA 01/25/2008    Lennart Pall, SPT 03/26/2017, 11:49 AM  [facility] [place], [place], [address] Phone: [phone]   Fax:  [phone]  Name: Linda Mcgrath MRN: [id number] Date of Birth: 09-17-1932  [place], [place], [address] Phone: [phone]   Fax:  [phone]  Physical Therapy Treatment  Patient Details  Name: Linda Mcgrath MRN: [id number] Date of Birth: 22-Jan-1933 Referring Provider: [name]  Encounter Date: 03/26/2017      PT End of Session - 03/26/17 1132    Visit Number 2   Date for PT Re-Evaluation 05/14/17   PT Start Time 7893   PT Stop Time 1145   PT Time Calculation (min) 58 min   Activity Tolerance Patient tolerated treatment well;Patient limited by pain   Behavior During Therapy [facility] for tasks assessed/performed      Past Medical History:  Diagnosis Date  . Allergic rhinitis   . Cataract    Bil/lens implant  . Colon polyp 1999   small polyp  . Diverticulosis   . Fibromyalgia   . Hyperlipidemia   . Hypertension   . Hypothyroidism   . Internal hemorrhoids   . Leg cramps   . Lung nodule    stable LUL 9 mm  . Menopausal syndrome   . Osteoarthritis   . UTI (urinary tract infection)     Past Surgical History:  Procedure Laterality Date  . Abd U/S  11/1998   negative  . Abd U/S  01/2001   gallbladder polyps  . ABDOMINAL HYSTERECTOMY  1975   total-fibroids  . APPENDECTOMY  1975  . BREAST BIOPSY     benign  . BREAST EXCISIONAL BIOPSY Left 1957  . CARDIOVERSION N/A 03/07/2016   Procedure: CARDIOVERSION;  Surgeon: Adrian Prows, MD;  Location: [place];  Service: Cardiovascular;  Laterality: N/A;  . CATARACT EXTRACTION     bilateral  . CHOLECYSTECTOMY    . COLONOSCOPY  1998   Diverticulosis; polyp\  . COLONOSCOPY  09/2002   Diverticulosis, hem  . COLONOSCOPY  12/2007   diverticulosis, polyp  . DEXA  10/2001   osteopenia  . ELECTROPHYSIOLOGIC STUDY N/A 04/03/2016   Procedure: A-Flutter Ablation;  Surgeon: Will Meredith Leeds, MD;  Location: [place] [facility];  Service: Cardiovascular;  Laterality: N/A;  . ESOPHAGOGASTRODUODENOSCOPY  2004  . [place]ida scan   01/2001   Negative  . KNEE SURGERY     right knee / 11/2004  . laser surgery for glaucoma Left Sep 21, 2014  . ROTATOR CUFF REPAIR     x 2 /right shoulder  . SKIN CANCER EXCISION     pre-melanoma / on face  . TEE WITHOUT CARDIOVERSION N/A 03/07/2016   Procedure: TRANSESOPHAGEAL ECHOCARDIOGRAM (TEE);  Surgeon: Adrian Prows, MD;  Location: [facility] ENDOSCOPY;  Service: Cardiovascular;  Laterality: N/A;    There were no vitals filed for this visit.      Subjective Assessment - 03/26/17 1052    Subjective Pt reports that she has been stiff in her knees over the weekend due to the weather. Reports no increased back pain with HEP. Did say that she has to break up the exercises into smaller chunks so that she can complete them. Reports some muscle spasms in her LE during her sleep.    Currently in Pain? Yes   Pain Score 5    Pain Location Back   Pain Orientation Lower                         [facility] Adult PT Treatment/Exercise - 03/26/17 0001      Exercises   Exercises Lumbar

## 2017-03-28 ENCOUNTER — Encounter: Payer: Self-pay | Admitting: Physical Therapy

## 2017-03-28 ENCOUNTER — Ambulatory Visit: Payer: Medicare Other | Admitting: Physical Therapy

## 2017-03-28 DIAGNOSIS — M62830 Muscle spasm of back: Secondary | ICD-10-CM | POA: Diagnosis not present

## 2017-03-28 DIAGNOSIS — M5441 Lumbago with sciatica, right side: Secondary | ICD-10-CM

## 2017-03-28 DIAGNOSIS — R262 Difficulty in walking, not elsewhere classified: Secondary | ICD-10-CM

## 2017-03-28 NOTE — Therapy (Signed)
[place], [place], [address] Phone: [phone]   Fax:  [phone]  Physical Therapy Treatment  Patient Details  Name: Linda Mcgrath MRN: [id number] Date of Birth: [birth year] Referring Provider: [name]  Encounter Date: 03/28/2017      PT End of Session - 03/28/17 1138    Visit Number 3   Date for PT Re-Evaluation 05/14/17   PT Start Time 1829   PT Stop Time 9371   PT Time Calculation (min) 56 min      Past Medical History:  Diagnosis Date  . Allergic rhinitis   . Cataract    Bil/lens implant  . Colon polyp 1999   small polyp  . Diverticulosis   . Fibromyalgia   . Hyperlipidemia   . Hypertension   . Hypothyroidism   . Internal hemorrhoids   . Leg cramps   . Lung nodule    stable LUL 9 mm  . Menopausal syndrome   . Osteoarthritis   . UTI (urinary tract infection)     Past Surgical History:  Procedure Laterality Date  . Abd U/S  11/1998   negative  . Abd U/S  01/2001   gallbladder polyps  . ABDOMINAL HYSTERECTOMY  1975   total-fibroids  . APPENDECTOMY  1975  . BREAST BIOPSY     benign  . BREAST EXCISIONAL BIOPSY Left 1957  . CARDIOVERSION N/A 03/07/2016   Procedure: CARDIOVERSION;  Surgeon: Adrian Prows, MD;  Location: [place];  Service: Cardiovascular;  Laterality: N/A;  . CATARACT EXTRACTION     bilateral  . CHOLECYSTECTOMY    . COLONOSCOPY  1998   Diverticulosis; polyp\  . COLONOSCOPY  09/2002   Diverticulosis, hem  . COLONOSCOPY  12/2007   diverticulosis, polyp  . DEXA  10/2001   osteopenia  . ELECTROPHYSIOLOGIC STUDY N/A 04/03/2016   Procedure: A-Flutter Ablation;  Surgeon: Will Meredith Leeds, MD;  Location: [place] [facility];  Service: Cardiovascular;  Laterality: N/A;  . ESOPHAGOGASTRODUODENOSCOPY  2004  . Hida scan  01/2001   Negative  . KNEE SURGERY     right knee / 11/2004  . laser surgery for glaucoma Left Sep 21, 2014  . ROTATOR CUFF REPAIR      x 2 /right shoulder  . SKIN CANCER EXCISION     pre-melanoma / on face  . TEE WITHOUT CARDIOVERSION N/A 03/07/2016   Procedure: TRANSESOPHAGEAL ECHOCARDIOGRAM (TEE);  Surgeon: Adrian Prows, MD;  Location: [facility] ENDOSCOPY;  Service: Cardiovascular;  Laterality: N/A;    There were no vitals filed for this visit.      Subjective Assessment - 03/28/17 1053    Subjective Pt reports that she is feeling okay today but she is having some sinus congestion and hip soreness from the weather. Said that her back has been feeling okay. Was a little stiff getting out of bed this morning, but moving around helped. Denies back pain today but reports knee pain d/t arthritis.    Patient Stated Goals No more back pain.    Currently in Pain? No/denies   Pain Score 0-No pain                         [facility] Adult PT Treatment/Exercise - 03/28/17 0001      Lumbar Exercises: Stretches   Passive Hamstring Stretch 3 reps;10 seconds  with calf stretch 3 reps, 10 sec   Quad Stretch --   Piriformis  motion   PT Next Visit Plan Core stabilization, strengthening, stretching. Modalites for pain.    PT Home Exercise Plan Posterior pelvic tilt (PPT), hooklying hip flexion with PPT, Supine straight leg raise with PPT, bridges, nerve flosses.    Consulted and Agree with Plan of Care Patient      Patient will benefit from skilled therapeutic intervention in order to improve the following deficits and impairments:  Decreased activity tolerance, Decreased mobility, Decreased endurance, Cardiopulmonary status limiting activity, Decreased range of motion, Decreased strength, Postural dysfunction, Improper body mechanics, Impaired flexibility, Pain, Increased muscle spasms, Difficulty walking  Visit Diagnosis: Acute right-sided low back pain with right-sided sciatica  Muscle spasm of back  Difficulty in walking, not elsewhere classified     Problem List Patient Active Problem List   Diagnosis Date Noted  . ETD (eustachian tube dysfunction) 03/17/2017  . Low back pain with right-sided sciatica 03/04/2017  . Tick bite of calf 01/28/2017  . Abscess and cellulitis of gluteal region 08/07/2016  . Atrial flutter ([place]) 03/06/2016  . Severe headache 03/04/2016  . Obesity 11/06/2015  . Urge incontinence 01/24/2015  . Estrogen deficiency 10/25/2014  . Lichen planus 00/37/9444  . Encounter for Medicare annual wellness exam 06/17/2013  . Aphthous ulcer of mouth 03/16/2013  . Screening mammogram, encounter for 06/15/2012  . Hyperglycemia 06/03/2011  . HYPERTENSION, BENIGN ESSENTIAL 10/23/2010  . CONSTIPATION 10/23/2010  . HEMATOCHEZIA 10/23/2010  . OSTEOARTHRITIS, HANDS, BILATERAL 01/24/2010  . Hypothyroidism 08/24/2008  . Vitamin D deficiency 06/06/2008  . Hyperlipidemia 06/06/2008  . MENOPAUSAL SYNDROME 03/29/2008  . Osteopenia 03/29/2008  .  ALLERGIC RHINITIS 01/25/2008  . IBS 01/25/2008  . OSTEOARTHRITIS 01/25/2008  . FIBROMYALGIA 01/25/2008    Lennart Pall, SPT 03/28/2017, 11:53 AM  [place], [place], [address] Phone: [phone]   Fax:  [phone]  Name: Linda Mcgrath MRN: [id number] Date of Birth: 08-30-1932  motion   PT Next Visit Plan Core stabilization, strengthening, stretching. Modalites for pain.    PT Home Exercise Plan Posterior pelvic tilt (PPT), hooklying hip flexion with PPT, Supine straight leg raise with PPT, bridges, nerve flosses.    Consulted and Agree with Plan of Care Patient      Patient will benefit from skilled therapeutic intervention in order to improve the following deficits and impairments:  Decreased activity tolerance, Decreased mobility, Decreased endurance, Cardiopulmonary status limiting activity, Decreased range of motion, Decreased strength, Postural dysfunction, Improper body mechanics, Impaired flexibility, Pain, Increased muscle spasms, Difficulty walking  Visit Diagnosis: Acute right-sided low back pain with right-sided sciatica  Muscle spasm of back  Difficulty in walking, not elsewhere classified     Problem List Patient Active Problem List   Diagnosis Date Noted  . ETD (eustachian tube dysfunction) 03/17/2017  . Low back pain with right-sided sciatica 03/04/2017  . Tick bite of calf 01/28/2017  . Abscess and cellulitis of gluteal region 08/07/2016  . Atrial flutter ([place]) 03/06/2016  . Severe headache 03/04/2016  . Obesity 11/06/2015  . Urge incontinence 01/24/2015  . Estrogen deficiency 10/25/2014  . Lichen planus 00/37/9444  . Encounter for Medicare annual wellness exam 06/17/2013  . Aphthous ulcer of mouth 03/16/2013  . Screening mammogram, encounter for 06/15/2012  . Hyperglycemia 06/03/2011  . HYPERTENSION, BENIGN ESSENTIAL 10/23/2010  . CONSTIPATION 10/23/2010  . HEMATOCHEZIA 10/23/2010  . OSTEOARTHRITIS, HANDS, BILATERAL 01/24/2010  . Hypothyroidism 08/24/2008  . Vitamin D deficiency 06/06/2008  . Hyperlipidemia 06/06/2008  . MENOPAUSAL SYNDROME 03/29/2008  . Osteopenia 03/29/2008  .  ALLERGIC RHINITIS 01/25/2008  . IBS 01/25/2008  . OSTEOARTHRITIS 01/25/2008  . FIBROMYALGIA 01/25/2008    Lennart Pall, SPT 03/28/2017, 11:53 AM  [place], [place], [address] Phone: [phone]   Fax:  [phone]  Name: Linda Mcgrath MRN: [id number] Date of Birth: 08-30-1932

## 2017-03-31 ENCOUNTER — Ambulatory Visit: Payer: Medicare Other | Admitting: Physical Therapy

## 2017-03-31 ENCOUNTER — Encounter: Payer: Self-pay | Admitting: Physical Therapy

## 2017-03-31 DIAGNOSIS — M62830 Muscle spasm of back: Secondary | ICD-10-CM | POA: Diagnosis not present

## 2017-03-31 DIAGNOSIS — M5441 Lumbago with sciatica, right side: Secondary | ICD-10-CM | POA: Diagnosis not present

## 2017-03-31 DIAGNOSIS — R262 Difficulty in walking, not elsewhere classified: Secondary | ICD-10-CM

## 2017-03-31 NOTE — Therapy (Signed)
[place], [place], [address] Phone: [phone]   Fax:  [phone]  Physical Therapy Treatment  Patient Details  Name: Linda Mcgrath MRN: [id number] Date of Birth: Sep 08, 1932 Referring Provider: [name]  Encounter Date: 03/31/2017      PT End of Session - 03/31/17 1055    Visit Number 4   Date for PT Re-Evaluation 05/14/17   PT Start Time 6734   PT Stop Time 1055   PT Time Calculation (min) 60 min   Activity Tolerance Patient tolerated treatment well;Patient limited by pain   Behavior During Therapy [facility] for tasks assessed/performed      Past Medical History:  Diagnosis Date  . Allergic rhinitis   . Cataract    Bil/lens implant  . Colon polyp 1999   small polyp  . Diverticulosis   . Fibromyalgia   . Hyperlipidemia   . Hypertension   . Hypothyroidism   . Internal hemorrhoids   . Leg cramps   . Lung nodule    stable LUL 9 mm  . Menopausal syndrome   . Osteoarthritis   . UTI (urinary tract infection)     Past Surgical History:  Procedure Laterality Date  . Abd U/S  11/1998   negative  . Abd U/S  01/2001   gallbladder polyps  . ABDOMINAL HYSTERECTOMY  1975   total-fibroids  . APPENDECTOMY  1975  . BREAST BIOPSY     benign  . BREAST EXCISIONAL BIOPSY Left 1957  . CARDIOVERSION N/A 03/07/2016   Procedure: CARDIOVERSION;  Surgeon: Adrian Prows, MD;  Location: [place];  Service: Cardiovascular;  Laterality: N/A;  . CATARACT EXTRACTION     bilateral  . CHOLECYSTECTOMY    . COLONOSCOPY  1998   Diverticulosis; polyp\  . COLONOSCOPY  09/2002   Diverticulosis, hem  . COLONOSCOPY  12/2007   diverticulosis, polyp  . DEXA  10/2001   osteopenia  . ELECTROPHYSIOLOGIC STUDY N/A 04/03/2016   Procedure: A-Flutter Ablation;  Surgeon: Will Meredith Leeds, MD;  Location: [place] [facility];  Service: Cardiovascular;  Laterality: N/A;  . ESOPHAGOGASTRODUODENOSCOPY  2004  . [place]ida scan   01/2001   Negative  . KNEE SURGERY     right knee / 11/2004  . laser surgery for glaucoma Left Sep 21, 2014  . ROTATOR CUFF REPAIR     x 2 /right shoulder  . SKIN CANCER EXCISION     pre-melanoma / on face  . TEE WITHOUT CARDIOVERSION N/A 03/07/2016   Procedure: TRANSESOPHAGEAL ECHOCARDIOGRAM (TEE);  Surgeon: Adrian Prows, MD;  Location: [facility] ENDOSCOPY;  Service: Cardiovascular;  Laterality: N/A;    There were no vitals filed for this visit.      Subjective Assessment - 03/31/17 0959    Subjective Pt reports that she is still having low back pain and soreness in her left hip during the day but worse in the mornings when she first gets out of bed. Reports that it has been persisent but doesn't seem to be getting worse.    Currently in Pain? Yes   Pain Score 3    Pain Location Back   Pain Orientation Lower                         [facility] Adult PT Treatment/Exercise - 03/31/17 0001      Lumbar Exercises: Aerobic   Stationary Bike Nustep level 4, 7 min.  for Medicare annual wellness exam 06/17/2013  . Aphthous ulcer of mouth 03/16/2013  . Screening mammogram, encounter for 06/15/2012  . Hyperglycemia 06/03/2011  . HYPERTENSION, BENIGN ESSENTIAL 10/23/2010  . CONSTIPATION 10/23/2010  . HEMATOCHEZIA 10/23/2010  . OSTEOARTHRITIS, HANDS, BILATERAL 01/24/2010  . Hypothyroidism 08/24/2008  . Vitamin D deficiency 06/06/2008  . Hyperlipidemia 06/06/2008  . MENOPAUSAL SYNDROME 03/29/2008  . Osteopenia 03/29/2008  . ALLERGIC RHINITIS 01/25/2008  . IBS 01/25/2008  . OSTEOARTHRITIS 01/25/2008  . FIBROMYALGIA 01/25/2008    Lennart Pall, SPT 03/31/2017, 11:19 AM  [place], [place], [address] Phone: [phone]   Fax:  [phone]  Name: [name] MRN: [id number] Date of Birth: 1933-08-23  [place], [place], [address] Phone: [phone]   Fax:  [phone]  Physical Therapy Treatment  Patient Details  Name: Linda Mcgrath MRN: [id number] Date of Birth: Sep 08, 1932 Referring Provider: [name]  Encounter Date: 03/31/2017      PT End of Session - 03/31/17 1055    Visit Number 4   Date for PT Re-Evaluation 05/14/17   PT Start Time 6734   PT Stop Time 1055   PT Time Calculation (min) 60 min   Activity Tolerance Patient tolerated treatment well;Patient limited by pain   Behavior During Therapy [facility] for tasks assessed/performed      Past Medical History:  Diagnosis Date  . Allergic rhinitis   . Cataract    Bil/lens implant  . Colon polyp 1999   small polyp  . Diverticulosis   . Fibromyalgia   . Hyperlipidemia   . Hypertension   . Hypothyroidism   . Internal hemorrhoids   . Leg cramps   . Lung nodule    stable LUL 9 mm  . Menopausal syndrome   . Osteoarthritis   . UTI (urinary tract infection)     Past Surgical History:  Procedure Laterality Date  . Abd U/S  11/1998   negative  . Abd U/S  01/2001   gallbladder polyps  . ABDOMINAL HYSTERECTOMY  1975   total-fibroids  . APPENDECTOMY  1975  . BREAST BIOPSY     benign  . BREAST EXCISIONAL BIOPSY Left 1957  . CARDIOVERSION N/A 03/07/2016   Procedure: CARDIOVERSION;  Surgeon: Adrian Prows, MD;  Location: [place];  Service: Cardiovascular;  Laterality: N/A;  . CATARACT EXTRACTION     bilateral  . CHOLECYSTECTOMY    . COLONOSCOPY  1998   Diverticulosis; polyp\  . COLONOSCOPY  09/2002   Diverticulosis, hem  . COLONOSCOPY  12/2007   diverticulosis, polyp  . DEXA  10/2001   osteopenia  . ELECTROPHYSIOLOGIC STUDY N/A 04/03/2016   Procedure: A-Flutter Ablation;  Surgeon: Will Meredith Leeds, MD;  Location: [place] [facility];  Service: Cardiovascular;  Laterality: N/A;  . ESOPHAGOGASTRODUODENOSCOPY  2004  . Hida scan   01/2001   Negative  . KNEE SURGERY     right knee / 11/2004  . laser surgery for glaucoma Left Sep 21, 2014  . ROTATOR CUFF REPAIR     x 2 /right shoulder  . SKIN CANCER EXCISION     pre-melanoma / on face  . TEE WITHOUT CARDIOVERSION N/A 03/07/2016   Procedure: TRANSESOPHAGEAL ECHOCARDIOGRAM (TEE);  Surgeon: Adrian Prows, MD;  Location: [facility] ENDOSCOPY;  Service: Cardiovascular;  Laterality: N/A;    There were no vitals filed for this visit.      Subjective Assessment - 03/31/17 0959    Subjective Pt reports that she is still having low back pain and soreness in her left hip during the day but worse in the mornings when she first gets out of bed. Reports that it has been persisent but doesn't seem to be getting worse.    Currently in Pain? Yes   Pain Score 3    Pain Location Back   Pain Orientation Lower                         [facility] Adult PT Treatment/Exercise - 03/31/17 0001      Lumbar Exercises: Aerobic   Stationary Bike Nustep level 4, 7 min.

## 2017-04-03 ENCOUNTER — Ambulatory Visit: Payer: Medicare Other | Admitting: Physical Therapy

## 2017-04-03 ENCOUNTER — Encounter: Payer: Self-pay | Admitting: Physical Therapy

## 2017-04-03 DIAGNOSIS — M62830 Muscle spasm of back: Secondary | ICD-10-CM | POA: Diagnosis not present

## 2017-04-03 DIAGNOSIS — R262 Difficulty in walking, not elsewhere classified: Secondary | ICD-10-CM | POA: Diagnosis not present

## 2017-04-03 DIAGNOSIS — M5441 Lumbago with sciatica, right side: Secondary | ICD-10-CM

## 2017-04-03 NOTE — Therapy (Signed)
[place], [place], [address] Phone: [phone]   Fax:  [phone]  Physical Therapy Treatment  Patient Details  Name: Linda Mcgrath MRN: [id number] Date of Birth: 1932-11-25 Referring Provider: [name]  Encounter Date: 04/03/2017      PT End of Session - 04/03/17 1037    Visit Number 5   Date for PT Re-Evaluation 05/14/17   PT Start Time 0928   PT Stop Time 1030   PT Time Calculation (min) 62 min   Activity Tolerance Patient tolerated treatment well;Patient limited by pain   Behavior During Therapy [facility] for tasks assessed/performed      Past Medical History:  Diagnosis Date  . Allergic rhinitis   . Cataract    Bil/lens implant  . Colon polyp 1999   small polyp  . Diverticulosis   . Fibromyalgia   . Hyperlipidemia   . Hypertension   . Hypothyroidism   . Internal hemorrhoids   . Leg cramps   . Lung nodule    stable LUL 9 mm  . Menopausal syndrome   . Osteoarthritis   . UTI (urinary tract infection)     Past Surgical History:  Procedure Laterality Date  . Abd U/S  11/1998   negative  . Abd U/S  01/2001   gallbladder polyps  . ABDOMINAL HYSTERECTOMY  1975   total-fibroids  . APPENDECTOMY  1975  . BREAST BIOPSY     benign  . BREAST EXCISIONAL BIOPSY Left 1957  . CARDIOVERSION N/A 03/07/2016   Procedure: CARDIOVERSION;  Surgeon: Adrian Prows, MD;  Location: [place];  Service: Cardiovascular;  Laterality: N/A;  . CATARACT EXTRACTION     bilateral  . CHOLECYSTECTOMY    . COLONOSCOPY  1998   Diverticulosis; polyp\  . COLONOSCOPY  09/2002   Diverticulosis, hem  . COLONOSCOPY  12/2007   diverticulosis, polyp  . DEXA  10/2001   osteopenia  . ELECTROPHYSIOLOGIC STUDY N/A 04/03/2016   Procedure: A-Flutter Ablation;  Surgeon: Will Meredith Leeds, MD;  Location: [facility];  Service: Cardiovascular;  Laterality: N/A;  . ESOPHAGOGASTRODUODENOSCOPY  2004  . Hida scan   01/2001   Negative  . KNEE SURGERY     right knee / 11/2004  . laser surgery for glaucoma Left Sep 21, 2014  . ROTATOR CUFF REPAIR     x 2 /right shoulder  . SKIN CANCER EXCISION     pre-melanoma / on face  . TEE WITHOUT CARDIOVERSION N/A 03/07/2016   Procedure: TRANSESOPHAGEAL ECHOCARDIOGRAM (TEE);  Surgeon: Adrian Prows, MD;  Location: [facility] ENDOSCOPY;  Service: Cardiovascular;  Laterality: N/A;    There were no vitals filed for this visit.      Subjective Assessment - 04/03/17 0933    Subjective Pt reports that she is still having soreness in her hip and pain in her low back. Said her hip bothers her especially when the weather changes.    Patient Stated Goals No more back pain.    Currently in Pain? Yes   Pain Score 5                          [facility] Adult PT Treatment/Exercise - 04/03/17 0001      Lumbar Exercises: Aerobic   Stationary Bike Recumbent bike 6 min.      Lumbar Exercises: Standing   Other Standing Lumbar Exercises Straight arm pull downs 10# 2x15  1.5lb  [place], [place], [address] Phone: [phone]   Fax:  [phone]  Physical Therapy Treatment  Patient Details  Name: Linda Mcgrath MRN: [id number] Date of Birth: 1932-11-25 Referring Provider: [name]  Encounter Date: 04/03/2017      PT End of Session - 04/03/17 1037    Visit Number 5   Date for PT Re-Evaluation 05/14/17   PT Start Time 0928   PT Stop Time 1030   PT Time Calculation (min) 62 min   Activity Tolerance Patient tolerated treatment well;Patient limited by pain   Behavior During Therapy [facility] for tasks assessed/performed      Past Medical History:  Diagnosis Date  . Allergic rhinitis   . Cataract    Bil/lens implant  . Colon polyp 1999   small polyp  . Diverticulosis   . Fibromyalgia   . Hyperlipidemia   . Hypertension   . Hypothyroidism   . Internal hemorrhoids   . Leg cramps   . Lung nodule    stable LUL 9 mm  . Menopausal syndrome   . Osteoarthritis   . UTI (urinary tract infection)     Past Surgical History:  Procedure Laterality Date  . Abd U/S  11/1998   negative  . Abd U/S  01/2001   gallbladder polyps  . ABDOMINAL HYSTERECTOMY  1975   total-fibroids  . APPENDECTOMY  1975  . BREAST BIOPSY     benign  . BREAST EXCISIONAL BIOPSY Left 1957  . CARDIOVERSION N/A 03/07/2016   Procedure: CARDIOVERSION;  Surgeon: Adrian Prows, MD;  Location: [place];  Service: Cardiovascular;  Laterality: N/A;  . CATARACT EXTRACTION     bilateral  . CHOLECYSTECTOMY    . COLONOSCOPY  1998   Diverticulosis; polyp\  . COLONOSCOPY  09/2002   Diverticulosis, hem  . COLONOSCOPY  12/2007   diverticulosis, polyp  . DEXA  10/2001   osteopenia  . ELECTROPHYSIOLOGIC STUDY N/A 04/03/2016   Procedure: A-Flutter Ablation;  Surgeon: Will Meredith Leeds, MD;  Location: [facility];  Service: Cardiovascular;  Laterality: N/A;  . ESOPHAGOGASTRODUODENOSCOPY  2004  . Hida scan   01/2001   Negative  . KNEE SURGERY     right knee / 11/2004  . laser surgery for glaucoma Left Sep 21, 2014  . ROTATOR CUFF REPAIR     x 2 /right shoulder  . SKIN CANCER EXCISION     pre-melanoma / on face  . TEE WITHOUT CARDIOVERSION N/A 03/07/2016   Procedure: TRANSESOPHAGEAL ECHOCARDIOGRAM (TEE);  Surgeon: Adrian Prows, MD;  Location: [facility] ENDOSCOPY;  Service: Cardiovascular;  Laterality: N/A;    There were no vitals filed for this visit.      Subjective Assessment - 04/03/17 0933    Subjective Pt reports that she is still having soreness in her hip and pain in her low back. Said her hip bothers her especially when the weather changes.    Patient Stated Goals No more back pain.    Currently in Pain? Yes   Pain Score 5                          [facility] Adult PT Treatment/Exercise - 04/03/17 0001      Lumbar Exercises: Aerobic   Stationary Bike Recumbent bike 6 min.      Lumbar Exercises: Standing   Other Standing Lumbar Exercises Straight arm pull downs 10# 2x15  1.5lb  W., PT 04/03/2017, 10:40 AM  [place], [place], [address] Phone: [phone]   Fax:  [phone]  Name: Linda Mcgrath MRN: [id number] Date of Birth: 1932/11/08

## 2017-04-07 ENCOUNTER — Encounter: Payer: Self-pay | Admitting: Physical Therapy

## 2017-04-07 ENCOUNTER — Ambulatory Visit: Payer: Medicare Other | Admitting: Physical Therapy

## 2017-04-07 DIAGNOSIS — M5441 Lumbago with sciatica, right side: Secondary | ICD-10-CM

## 2017-04-07 DIAGNOSIS — M62830 Muscle spasm of back: Secondary | ICD-10-CM

## 2017-04-07 DIAGNOSIS — R262 Difficulty in walking, not elsewhere classified: Secondary | ICD-10-CM | POA: Diagnosis not present

## 2017-04-07 NOTE — Therapy (Signed)
[facility] [address] [place], [place], [address] Phone: [phone]   Fax:  [phone]  Physical Therapy Treatment  Patient Details  Name: Linda Mcgrath MRN: [id number] Date of Birth: 01-18-1933 Referring Provider: Milinda Antis  Encounter Date: 04/07/2017      PT End of Session - 04/07/17 1051    Visit Number 6   Date for PT Re-Evaluation 05/14/17   PT Start Time 1001   PT Stop Time 1100   PT Time Calculation (min) 59 min   Activity Tolerance Patient tolerated treatment well   Behavior During Therapy [facility] for tasks assessed/performed      Past Medical History:  Diagnosis Date  . Allergic rhinitis   . Cataract    Bil/lens implant  . Colon polyp 1999   small polyp  . Diverticulosis   . Fibromyalgia   . Hyperlipidemia   . Hypertension   . Hypothyroidism   . Internal hemorrhoids   . Leg cramps   . Lung nodule    stable LUL 9 mm  . Menopausal syndrome   . Osteoarthritis   . UTI (urinary tract infection)     Past Surgical History:  Procedure Laterality Date  . Abd U/S  11/1998   negative  . Abd U/S  01/2001   gallbladder polyps  . ABDOMINAL HYSTERECTOMY  1975   total-fibroids  . APPENDECTOMY  1975  . BREAST BIOPSY     benign  . BREAST EXCISIONAL BIOPSY Left 1957  . CARDIOVERSION N/A 03/07/2016   Procedure: CARDIOVERSION;  Surgeon: Yates Decamp, MD;  Location: [facility] ENDOSCOPY;  Service: Cardiovascular;  Laterality: N/A;  . CATARACT EXTRACTION     bilateral  . CHOLECYSTECTOMY    . COLONOSCOPY  1998   Diverticulosis; polyp\  . COLONOSCOPY  09/2002   Diverticulosis, hem  . COLONOSCOPY  12/2007   diverticulosis, polyp  . DEXA  10/2001   osteopenia  . ELECTROPHYSIOLOGIC STUDY N/A 04/03/2016   Procedure: A-Flutter Ablation;  Surgeon: Will [name], MD;  Location: [facility];  Service: Cardiovascular;  Laterality: N/A;  . ESOPHAGOGASTRODUODENOSCOPY  2004  . Hida scan  01/2001   Negative   . KNEE SURGERY     right knee / 11/2004  . laser surgery for glaucoma Left Sep 21, 2014  . ROTATOR CUFF REPAIR     x 2 /right shoulder  . SKIN CANCER EXCISION     pre-melanoma / on face  . TEE WITHOUT CARDIOVERSION N/A 03/07/2016   Procedure: TRANSESOPHAGEAL ECHOCARDIOGRAM (TEE);  Surgeon: Yates Decamp, MD;  Location: [facility] ENDOSCOPY;  Service: Cardiovascular;  Laterality: N/A;    There were no vitals filed for this visit.      Subjective Assessment - 04/07/17 1003    Subjective Reports that she is feeling a little better, still some soreness in the hips and the legs.  C/O some cramping in the calves and feet last night   Currently in Pain? Yes   Pain Score 3    Pain Location Back   Pain Orientation Lower   Aggravating Factors  bending and lifting                         [facility] Adult PT Treatment/Exercise - 04/07/17 0001      Lumbar Exercises: Stretches   Passive Hamstring Stretch 3 reps;10 seconds   Piriformis Stretch 3 reps;10 seconds     Lumbar Exercises: Aerobic   Stationary  Bike Recumbent bike 6 min.      Lumbar Exercises: Seated   Long Arc Quad on Chair Both;2 sets;10 reps   LAQ on Chair Weights (lbs) 2.5   Other Seated Lumbar Exercises Seated crunches with Red theraband. 3x15   Other Seated Lumbar Exercises Clamshells 2x15 (red theraband) , ball squeeze, green tband HS curls, Red tband DF     Moist Heat Therapy   Number Minutes Moist Heat 15 Minutes   Moist Heat Location Lumbar Spine;Hip     Electrical Stimulation   Electrical Stimulation Location L/S spine   Electrical Stimulation Action IFC   Electrical Stimulation Parameters sitting   Electrical Stimulation Goals Pain                  PT Short Term Goals - 04/07/17 1053      PT SHORT TERM GOAL #1   Title independent with HEP   Status Achieved           PT Long Term Goals - 04/03/17 1039      PT LONG TERM GOAL #1   Title decrease pain 50%   Status Partially Met     PT  LONG TERM GOAL #2   Title Increase lumbar AROM 25% in extension, side-bending, and rotation.    Status Partially Met               Plan - 04/07/17 1052    Clinical Impression Statement I backed off of some of the standing and supine exercises as those were ones that seemed to cause pain and we will try to build back to those.   PT Next Visit Plan continue to work on strength and function and see if we can progress without increase of pain   Consulted and Agree with Plan of Care Patient      Patient will benefit from skilled therapeutic intervention in order to improve the following deficits and impairments:  Decreased activity tolerance, Decreased mobility, Decreased endurance, Cardiopulmonary status limiting activity, Decreased range of motion, Decreased strength, Postural dysfunction, Improper body mechanics, Impaired flexibility, Pain, Increased muscle spasms, Difficulty walking  Visit Diagnosis: Acute right-sided low back pain with right-sided sciatica  Muscle spasm of back  Difficulty in walking, not elsewhere classified     Problem List Patient Active Problem List   Diagnosis Date Noted  . ETD (eustachian tube dysfunction) 03/17/2017  . Low back pain with right-sided sciatica 03/04/2017  . Tick bite of calf 01/28/2017  . Abscess and cellulitis of gluteal region 08/07/2016  . Atrial flutter (HCC) 03/06/2016  . Severe headache 03/04/2016  . Obesity 11/06/2015  . Urge incontinence 01/24/2015  . Estrogen deficiency 10/25/2014  . Lichen planus 12/20/2013  . Encounter for Medicare annual wellness exam 06/17/2013  . Aphthous ulcer of mouth 03/16/2013  . Screening mammogram, encounter for 06/15/2012  . Hyperglycemia 06/03/2011  . HYPERTENSION, BENIGN ESSENTIAL 10/23/2010  . CONSTIPATION 10/23/2010  . HEMATOCHEZIA 10/23/2010  . OSTEOARTHRITIS, HANDS, BILATERAL 01/24/2010  . Hypothyroidism 08/24/2008  . Vitamin D deficiency 06/06/2008  . Hyperlipidemia 06/06/2008   . MENOPAUSAL SYNDROME 03/29/2008  . Osteopenia 03/29/2008  . ALLERGIC RHINITIS 01/25/2008  . IBS 01/25/2008  . OSTEOARTHRITIS 01/25/2008  . FIBROMYALGIA 01/25/2008    Jearld Lesch., PT 04/07/2017, 10:53 AM  [facility] [address] [place], [place], [address] Phone: [phone]   Fax:  [phone]  Name: Linda Mcgrath MRN: [id number] Date of Birth: Jan 07, 1933

## 2017-04-10 ENCOUNTER — Ambulatory Visit: Payer: Medicare Other | Admitting: Physical Therapy

## 2017-04-14 ENCOUNTER — Encounter: Payer: Self-pay | Admitting: Physical Therapy

## 2017-04-14 ENCOUNTER — Ambulatory Visit: Payer: Medicare Other | Admitting: Physical Therapy

## 2017-04-14 DIAGNOSIS — R262 Difficulty in walking, not elsewhere classified: Secondary | ICD-10-CM | POA: Diagnosis not present

## 2017-04-14 DIAGNOSIS — M5441 Lumbago with sciatica, right side: Secondary | ICD-10-CM | POA: Diagnosis not present

## 2017-04-14 DIAGNOSIS — M62830 Muscle spasm of back: Secondary | ICD-10-CM

## 2017-04-14 NOTE — Therapy (Signed)
Other Seated Lumbar Exercises Seated crunches with Red ball. x10, Seated march 2x10    Other Seated Lumbar Exercises Clamshells 2x15 (manual resistance) , ball squeeze, green tband HS curls, Red tband DF     Moist Heat Therapy   Number Minutes Moist Heat 15 Minutes   Moist Heat Location Lumbar Spine;Hip     Electrical Stimulation   Electrical Stimulation Location L/S spine   Electrical Stimulation Action IFC    Electrical Stimulation Parameters sitting   Electrical Stimulation Goals Pain                  PT Short Term Goals - 04/07/17 1053      PT SHORT TERM GOAL #1   Title independent with HEP   Status Achieved           PT Long Term Goals - 04/03/17 1039      PT LONG TERM GOAL #1   Title decrease pain 50%   Status Partially Met     PT LONG TERM GOAL #2   Title Increase lumbar AROM 25% in extension, side-bending, and rotation.    Status Partially Met               Plan - 04/14/17 1218    Clinical  Impression Statement Continued with seated core strengthening interventions, added standing hip and sit to stand interventions. Pt reports that she cant do much with her UE due to it causing pain.   Rehab Potential Good   PT Frequency 2x / week   PT Duration 8 weeks      Patient will benefit from skilled therapeutic intervention in order to improve the following deficits and impairments:     Visit Diagnosis: Acute right-sided low back pain with right-sided sciatica  Muscle spasm of back     Problem List Patient Active Problem List   Diagnosis Date Noted  . ETD (eustachian tube dysfunction) 03/17/2017  . Low back pain with right-sided sciatica 03/04/2017  . Tick bite of calf 01/28/2017  . Abscess and cellulitis of gluteal region 08/07/2016  . Atrial flutter ([place]) 03/06/2016  . Severe headache 03/04/2016  . Obesity 11/06/2015  . Urge incontinence 01/24/2015  . Estrogen deficiency 10/25/2014  . Lichen planus 50/72/2575  . Encounter for Medicare annual wellness exam 06/17/2013  . Aphthous ulcer of mouth 03/16/2013  . Screening mammogram, encounter for 06/15/2012  . Hyperglycemia 06/03/2011  . HYPERTENSION, BENIGN ESSENTIAL 10/23/2010  . CONSTIPATION 10/23/2010  . HEMATOCHEZIA 10/23/2010  . OSTEOARTHRITIS, HANDS, BILATERAL 01/24/2010  . Hypothyroidism 08/24/2008  . Vitamin D deficiency 06/06/2008  . Hyperlipidemia 06/06/2008  . MENOPAUSAL SYNDROME 03/29/2008  . Osteopenia 03/29/2008  . ALLERGIC RHINITIS 01/25/2008  . IBS 01/25/2008  . OSTEOARTHRITIS 01/25/2008  . FIBROMYALGIA 01/25/2008    Scot Jun, PTA 04/14/2017, 12:19 PM  [place], [place], [address] Phone: [phone]   Fax:  [phone]  Name: Linda Mcgrath MRN: [id number] Date of Birth: 1933/04/05  [place], [place], [address] Phone: [phone]   Fax:  [phone]  Physical Therapy Treatment  Patient Details  Name: Linda Mcgrath MRN: [id number] Date of Birth: [birth year] Referring Provider: [name]  Encounter Date: 04/14/2017      PT End of Session - 04/14/17 1217    PT Start Time 1143   PT Stop Time 1232   PT Time Calculation (min) 49 min   Activity Tolerance Patient tolerated treatment well   Behavior During Therapy [facility] for tasks assessed/performed      Past Medical History:  Diagnosis Date  . Allergic rhinitis   . Cataract    Bil/lens implant  . Colon polyp 1999   small polyp  . Diverticulosis   . Fibromyalgia   . Hyperlipidemia   . Hypertension   . Hypothyroidism   . Internal hemorrhoids   . Leg cramps   . Lung nodule    stable LUL 9 mm  . Menopausal syndrome   . Osteoarthritis   . UTI (urinary tract infection)     Past Surgical History:  Procedure Laterality Date  . Abd U/S  11/1998   negative  . Abd U/S  01/2001   gallbladder polyps  . ABDOMINAL HYSTERECTOMY  1975   total-fibroids  . APPENDECTOMY  1975  . BREAST BIOPSY     benign  . BREAST EXCISIONAL BIOPSY Left 1957  . CARDIOVERSION N/A 03/07/2016   Procedure: CARDIOVERSION;  Surgeon: Adrian Prows, MD;  Location: [place];  Service: Cardiovascular;  Laterality: N/A;  . CATARACT EXTRACTION     bilateral  . CHOLECYSTECTOMY    . COLONOSCOPY  1998   Diverticulosis; polyp\  . COLONOSCOPY  09/2002   Diverticulosis, hem  . COLONOSCOPY  12/2007   diverticulosis, polyp  . DEXA  10/2001   osteopenia  . ELECTROPHYSIOLOGIC STUDY N/A 04/03/2016   Procedure: A-Flutter Ablation;  Surgeon: Will Meredith Leeds, MD;  Location: [place] [facility];  Service: Cardiovascular;  Laterality: N/A;  . ESOPHAGOGASTRODUODENOSCOPY  2004  . Hida scan  01/2001   Negative  . KNEE SURGERY     right knee / 11/2004  . laser  surgery for glaucoma Left Sep 21, 2014  . ROTATOR CUFF REPAIR     x 2 /right shoulder  . SKIN CANCER EXCISION     pre-melanoma / on face  . TEE WITHOUT CARDIOVERSION N/A 03/07/2016   Procedure: TRANSESOPHAGEAL ECHOCARDIOGRAM (TEE);  Surgeon: Adrian Prows, MD;  Location: [facility] ENDOSCOPY;  Service: Cardiovascular;  Laterality: N/A;    There were no vitals filed for this visit.      Subjective Assessment - 04/14/17 1143    Subjective Pt reports that things are going slow and she still has pain across her back.    Currently in Pain? Yes   Pain Score 5    Pain Location Back                         [facility] Adult PT Treatment/Exercise - 04/14/17 0001      Lumbar Exercises: Aerobic   Elliptical NuStep L3 x6 min      Lumbar Exercises: Standing   Other Standing Lumbar Exercises Hip ext & abd x10      Lumbar Exercises: Seated   Long Arc Quad on Chair Both;2 sets;10 reps   LAQ on Chair Weights (lbs) 2.5   Sit to Stand 10 reps  x2

## 2017-04-17 ENCOUNTER — Ambulatory Visit: Payer: Medicare Other | Admitting: Physical Therapy

## 2017-04-17 ENCOUNTER — Encounter: Payer: Self-pay | Admitting: Physical Therapy

## 2017-04-17 DIAGNOSIS — R262 Difficulty in walking, not elsewhere classified: Secondary | ICD-10-CM

## 2017-04-17 DIAGNOSIS — M62830 Muscle spasm of back: Secondary | ICD-10-CM | POA: Diagnosis not present

## 2017-04-17 DIAGNOSIS — M5441 Lumbago with sciatica, right side: Secondary | ICD-10-CM | POA: Diagnosis not present

## 2017-04-17 NOTE — Therapy (Signed)
01/24/2010  . Hypothyroidism 08/24/2008  . Vitamin D deficiency 06/06/2008  . Hyperlipidemia 06/06/2008  . MENOPAUSAL SYNDROME 03/29/2008  . Osteopenia 03/29/2008  . ALLERGIC RHINITIS 01/25/2008  . IBS 01/25/2008  . OSTEOARTHRITIS 01/25/2008  . FIBROMYALGIA 01/25/2008    [name], PT 04/17/2017, 11:51 AM  [place] [address] [address] [place], [place], [address] Phone: [phone]   Fax:  [phone]  Name: Linda Mcgrath MRN: [id number] Date of Birth: 11-10-1932  01/24/2010  . Hypothyroidism 08/24/2008  . Vitamin D deficiency 06/06/2008  . Hyperlipidemia 06/06/2008  . MENOPAUSAL SYNDROME 03/29/2008  . Osteopenia 03/29/2008  . ALLERGIC RHINITIS 01/25/2008  . IBS 01/25/2008  . OSTEOARTHRITIS 01/25/2008  . FIBROMYALGIA 01/25/2008    [name], PT 04/17/2017, 11:51 AM  [place] [address] [place], [place], [address] Phone: [phone]   Fax:  [phone]  Name: Linda Mcgrath MRN: [id number] Date of Birth: 11-10-1932  [place], [place], [address] Phone: [phone]   Fax:  [phone]  Physical Therapy Treatment  Patient Details  Name: Linda Mcgrath MRN: [id number] Date of Birth: 1932/11/30 Referring Provider: [name]  Encounter Date: 04/17/2017      PT End of Session - 04/17/17 1148    Visit Number 7   Date for PT Re-Evaluation 05/14/17   PT Start Time 1048   PT Stop Time 1146   PT Time Calculation (min) 58 min   Activity Tolerance Patient tolerated treatment well   Behavior During Therapy [facility] for tasks assessed/performed      Past Medical History:  Diagnosis Date  . Allergic rhinitis   . Cataract    Bil/lens implant  . Colon polyp 1999   small polyp  . Diverticulosis   . Fibromyalgia   . Hyperlipidemia   . Hypertension   . Hypothyroidism   . Internal hemorrhoids   . Leg cramps   . Lung nodule    stable LUL 9 mm  . Menopausal syndrome   . Osteoarthritis   . UTI (urinary tract infection)     Past Surgical History:  Procedure Laterality Date  . Abd U/S  11/1998   negative  . Abd U/S  01/2001   gallbladder polyps  . ABDOMINAL HYSTERECTOMY  1975   total-fibroids  . APPENDECTOMY  1975  . BREAST BIOPSY     benign  . BREAST EXCISIONAL BIOPSY Left 1957  . CARDIOVERSION N/A 03/07/2016   Procedure: CARDIOVERSION;  Surgeon: Adrian Prows, MD;  Location: [place];  Service: Cardiovascular;  Laterality: N/A;  . CATARACT EXTRACTION     bilateral  . CHOLECYSTECTOMY    . COLONOSCOPY  1998   Diverticulosis; polyp\  . COLONOSCOPY  09/2002   Diverticulosis, hem  . COLONOSCOPY  12/2007   diverticulosis, polyp  . DEXA  10/2001   osteopenia  . ELECTROPHYSIOLOGIC STUDY N/A 04/03/2016   Procedure: A-Flutter Ablation;  Surgeon: Will Meredith Leeds, MD;  Location: [place] CV [facility];  Service: Cardiovascular;  Laterality: N/A;  . ESOPHAGOGASTRODUODENOSCOPY  2004  . Hida scan  01/2001   Negative   . KNEE SURGERY     right knee / 11/2004  . laser surgery for glaucoma Left Sep 21, 2014  . ROTATOR CUFF REPAIR     x 2 /right shoulder  . SKIN CANCER EXCISION     pre-melanoma / on face  . TEE WITHOUT CARDIOVERSION N/A 03/07/2016   Procedure: TRANSESOPHAGEAL ECHOCARDIOGRAM (TEE);  Surgeon: Adrian Prows, MD;  Location: [facility] ENDOSCOPY;  Service: Cardiovascular;  Laterality: N/A;    There were no vitals filed for this visit.      Subjective Assessment - 04/17/17 1143    Subjective Patient reports increased back, hip and knee pain since the last visit, has bruising at thighs looks like from the band we put on her for hip abduction.   Currently in Pain? Yes   Pain Score 7    Pain Location Back   Pain Orientation Lower   Pain Descriptors / Indicators Aching;Sore                         [facility] Adult PT Treatment/Exercise - 04/17/17 0001      Lumbar Exercises: Stretches   Passive Hamstring Stretch 4 reps;20 seconds   Single Knee to Chest Stretch 5 reps;10 seconds   Double Knee to Chest Stretch 3

## 2017-04-22 ENCOUNTER — Encounter: Payer: Self-pay | Admitting: Physical Therapy

## 2017-04-22 ENCOUNTER — Ambulatory Visit: Payer: Medicare Other | Admitting: Physical Therapy

## 2017-04-22 DIAGNOSIS — R262 Difficulty in walking, not elsewhere classified: Secondary | ICD-10-CM | POA: Diagnosis not present

## 2017-04-22 DIAGNOSIS — M5441 Lumbago with sciatica, right side: Secondary | ICD-10-CM

## 2017-04-22 DIAGNOSIS — M62830 Muscle spasm of back: Secondary | ICD-10-CM | POA: Diagnosis not present

## 2017-04-22 NOTE — Therapy (Signed)
[place], [place], [address] Phone: [phone]   Fax:  [phone]  Physical Therapy Treatment  Patient Details  Name: Linda Mcgrath MRN: [id number] Date of Birth: 1933/01/19 Referring Provider: [name]  Encounter Date: 04/22/2017      PT End of Session - 04/22/17 1555    Visit Number 9   Date for PT Re-Evaluation 05/14/17   PT Start Time 5320   PT Stop Time 1609   PT Time Calculation (min) 54 min   Activity Tolerance Patient tolerated treatment well   Behavior During Therapy [facility] for tasks assessed/performed      Past Medical History:  Diagnosis Date  . Allergic rhinitis   . Cataract    Bil/lens implant  . Colon polyp 1999   small polyp  . Diverticulosis   . Fibromyalgia   . Hyperlipidemia   . Hypertension   . Hypothyroidism   . Internal hemorrhoids   . Leg cramps   . Lung nodule    stable LUL 9 mm  . Menopausal syndrome   . Osteoarthritis   . UTI (urinary tract infection)     Past Surgical History:  Procedure Laterality Date  . Abd U/S  11/1998   negative  . Abd U/S  01/2001   gallbladder polyps  . ABDOMINAL HYSTERECTOMY  1975   total-fibroids  . APPENDECTOMY  1975  . BREAST BIOPSY     benign  . BREAST EXCISIONAL BIOPSY Left 1957  . CARDIOVERSION N/A 03/07/2016   Procedure: CARDIOVERSION;  Surgeon: Adrian Prows, MD;  Location: [place];  Service: Cardiovascular;  Laterality: N/A;  . CATARACT EXTRACTION     bilateral  . CHOLECYSTECTOMY    . COLONOSCOPY  1998   Diverticulosis; polyp\  . COLONOSCOPY  09/2002   Diverticulosis, hem  . COLONOSCOPY  12/2007   diverticulosis, polyp  . DEXA  10/2001   osteopenia  . ELECTROPHYSIOLOGIC STUDY N/A 04/03/2016   Procedure: A-Flutter Ablation;  Surgeon: Will Meredith Leeds, MD;  Location: [place] [facility];  Service: Cardiovascular;  Laterality: N/A;  . ESOPHAGOGASTRODUODENOSCOPY  2004  . Hida scan  01/2001   Negative   . KNEE SURGERY     right knee / 11/2004  . laser surgery for glaucoma Left Sep 21, 2014  . ROTATOR CUFF REPAIR     x 2 /right shoulder  . SKIN CANCER EXCISION     pre-melanoma / on face  . TEE WITHOUT CARDIOVERSION N/A 03/07/2016   Procedure: TRANSESOPHAGEAL ECHOCARDIOGRAM (TEE);  Surgeon: Adrian Prows, MD;  Location: [facility] ENDOSCOPY;  Service: Cardiovascular;  Laterality: N/A;    There were no vitals filed for this visit.      Subjective Assessment - 04/22/17 1517    Subjective Pt reports that last treatment did a lot of good for her R hip but it has now jumped to her L hip. Pt reports that all of her joints are sore today.   Currently in Pain? Yes   Pain Score 6    Pain Location --  back, knee, hips   Pain Descriptors / Indicators Aching;Sore                         [facility] Adult PT Treatment/Exercise - 04/22/17 0001      Lumbar Exercises: Stretches   Passive Hamstring Stretch 4 reps;20 seconds   Single Knee to Chest Stretch 5 reps;10 seconds   Double Knee  [place], [place], [address] Phone: [phone]   Fax:  [phone]  Physical Therapy Treatment  Patient Details  Name: Linda Mcgrath MRN: [id number] Date of Birth: 1933/01/19 Referring Provider: [name]  Encounter Date: 04/22/2017      PT End of Session - 04/22/17 1555    Visit Number 9   Date for PT Re-Evaluation 05/14/17   PT Start Time 5320   PT Stop Time 1609   PT Time Calculation (min) 54 min   Activity Tolerance Patient tolerated treatment well   Behavior During Therapy [facility] for tasks assessed/performed      Past Medical History:  Diagnosis Date  . Allergic rhinitis   . Cataract    Bil/lens implant  . Colon polyp 1999   small polyp  . Diverticulosis   . Fibromyalgia   . Hyperlipidemia   . Hypertension   . Hypothyroidism   . Internal hemorrhoids   . Leg cramps   . Lung nodule    stable LUL 9 mm  . Menopausal syndrome   . Osteoarthritis   . UTI (urinary tract infection)     Past Surgical History:  Procedure Laterality Date  . Abd U/S  11/1998   negative  . Abd U/S  01/2001   gallbladder polyps  . ABDOMINAL HYSTERECTOMY  1975   total-fibroids  . APPENDECTOMY  1975  . BREAST BIOPSY     benign  . BREAST EXCISIONAL BIOPSY Left 1957  . CARDIOVERSION N/A 03/07/2016   Procedure: CARDIOVERSION;  Surgeon: Adrian Prows, MD;  Location: [place];  Service: Cardiovascular;  Laterality: N/A;  . CATARACT EXTRACTION     bilateral  . CHOLECYSTECTOMY    . COLONOSCOPY  1998   Diverticulosis; polyp\  . COLONOSCOPY  09/2002   Diverticulosis, hem  . COLONOSCOPY  12/2007   diverticulosis, polyp  . DEXA  10/2001   osteopenia  . ELECTROPHYSIOLOGIC STUDY N/A 04/03/2016   Procedure: A-Flutter Ablation;  Surgeon: Will Meredith Leeds, MD;  Location: [place] [facility];  Service: Cardiovascular;  Laterality: N/A;  . ESOPHAGOGASTRODUODENOSCOPY  2004  . Hida scan  01/2001   Negative   . KNEE SURGERY     right knee / 11/2004  . laser surgery for glaucoma Left Sep 21, 2014  . ROTATOR CUFF REPAIR     x 2 /right shoulder  . SKIN CANCER EXCISION     pre-melanoma / on face  . TEE WITHOUT CARDIOVERSION N/A 03/07/2016   Procedure: TRANSESOPHAGEAL ECHOCARDIOGRAM (TEE);  Surgeon: Adrian Prows, MD;  Location: [facility] ENDOSCOPY;  Service: Cardiovascular;  Laterality: N/A;    There were no vitals filed for this visit.      Subjective Assessment - 04/22/17 1517    Subjective Pt reports that last treatment did a lot of good for her R hip but it has now jumped to her L hip. Pt reports that all of her joints are sore today.   Currently in Pain? Yes   Pain Score 6    Pain Location --  back, knee, hips   Pain Descriptors / Indicators Aching;Sore                         [facility] Adult PT Treatment/Exercise - 04/22/17 0001      Lumbar Exercises: Stretches   Passive Hamstring Stretch 4 reps;20 seconds   Single Knee to Chest Stretch 5 reps;10 seconds   Double Knee  Screening mammogram, encounter for 06/15/2012  . Hyperglycemia 06/03/2011  . HYPERTENSION, BENIGN ESSENTIAL 10/23/2010  . CONSTIPATION 10/23/2010  . HEMATOCHEZIA 10/23/2010  . OSTEOARTHRITIS, HANDS, BILATERAL 01/24/2010  . Hypothyroidism 08/24/2008  . Vitamin D deficiency 06/06/2008  . Hyperlipidemia 06/06/2008  . MENOPAUSAL SYNDROME 03/29/2008  . Osteopenia 03/29/2008  . ALLERGIC RHINITIS 01/25/2008  . IBS 01/25/2008  . OSTEOARTHRITIS 01/25/2008  . FIBROMYALGIA 01/25/2008    Scot Jun, PTA 04/22/2017, 3:58 PM  [place], [place], [address] Phone: [phone]   Fax:  [phone]  Name: HAWA HENLY MRN: [id number] Date of Birth: Feb 05, 1933

## 2017-04-24 ENCOUNTER — Encounter: Payer: Self-pay | Admitting: Physical Therapy

## 2017-04-24 ENCOUNTER — Ambulatory Visit: Payer: Medicare Other | Admitting: Physical Therapy

## 2017-04-24 DIAGNOSIS — M62830 Muscle spasm of back: Secondary | ICD-10-CM | POA: Diagnosis not present

## 2017-04-24 DIAGNOSIS — M5441 Lumbago with sciatica, right side: Secondary | ICD-10-CM | POA: Diagnosis not present

## 2017-04-24 DIAGNOSIS — R262 Difficulty in walking, not elsewhere classified: Secondary | ICD-10-CM | POA: Diagnosis not present

## 2017-04-24 NOTE — Therapy (Signed)
Linda Mcgrath [place], [place], [address] Phone: [phone]   Fax:  [phone]  Physical Therapy Treatment  Patient Details  Name: Linda Mcgrath MRN: [id number] Date of Birth: [birth year] Referring Provider: [name]  Encounter Date: 04/24/2017      PT End of Session - 04/24/17 1049    Visit Number 10   Date for PT Re-Evaluation 05/14/17   PT Start Time 1010   PT Stop Time 1104   PT Time Calculation (min) 54 min      Past Medical History:  Diagnosis Date  . Allergic rhinitis   . Cataract    Bil/lens implant  . Colon polyp 1999   small polyp  . Diverticulosis   . Fibromyalgia   . Hyperlipidemia   . Hypertension   . Hypothyroidism   . Internal hemorrhoids   . Leg cramps   . Lung nodule    stable LUL 9 mm  . Menopausal syndrome   . Osteoarthritis   . UTI (urinary tract infection)     Past Surgical History:  Procedure Laterality Date  . Abd U/S  11/1998   negative  . Abd U/S  01/2001   gallbladder polyps  . ABDOMINAL HYSTERECTOMY  1975   total-fibroids  . APPENDECTOMY  1975  . BREAST BIOPSY     benign  . BREAST EXCISIONAL BIOPSY Left 1957  . CARDIOVERSION N/A 03/07/2016   Procedure: CARDIOVERSION;  Surgeon: Adrian Prows, MD;  Location: [place];  Service: Cardiovascular;  Laterality: N/A;  . CATARACT EXTRACTION     bilateral  . CHOLECYSTECTOMY    . COLONOSCOPY  1998   Diverticulosis; polyp\  . COLONOSCOPY  09/2002   Diverticulosis, hem  . COLONOSCOPY  12/2007   diverticulosis, polyp  . DEXA  10/2001   osteopenia  . ELECTROPHYSIOLOGIC STUDY N/A 04/03/2016   Procedure: A-Flutter Ablation;  Surgeon: Will Meredith Leeds, MD;  Location: [place] [facility];  Service: Cardiovascular;  Laterality: N/A;  . ESOPHAGOGASTRODUODENOSCOPY  2004  . Hida scan  01/2001   Negative  . KNEE SURGERY     right knee / 11/2004  . laser surgery for glaucoma Left Sep 21, 2014  . ROTATOR CUFF REPAIR      x 2 /right shoulder  . SKIN CANCER EXCISION     pre-melanoma / on face  . TEE WITHOUT CARDIOVERSION N/A 03/07/2016   Procedure: TRANSESOPHAGEAL ECHOCARDIOGRAM (TEE);  Surgeon: Adrian Prows, MD;  Location: [facility] ENDOSCOPY;  Service: Cardiovascular;  Laterality: N/A;    There were no vitals filed for this visit.      Subjective Assessment - 04/24/17 1008    Subjective Pt reports that she is sore all over. Pt reports that it is easier to move   Currently in Pain? Yes   Pain Score 4    Pain Location Back                         [facility] Adult PT Treatment/Exercise - 04/24/17 0001      Lumbar Exercises: Stretches   Passive Hamstring Stretch 4 reps;10 seconds   ITB Stretch 3 reps;10 seconds   Piriformis Stretch 3 reps;10 seconds     Lumbar Exercises: Aerobic   Elliptical NuStep L3 x6 min      Lumbar Exercises: Supine   Heel Slides 5 reps  x2   Bridge 20 reps   Straight Leg Raise 10 reps;2 seconds  04/24/2017, 10:59 AM  [place], [place], [address] Phone: [phone]   Fax:  [phone]  Name: Linda Mcgrath MRN: [id number] Date of Birth: 15-Jan-1933  Linda Mcgrath [place], [place], [address] Phone: [phone]   Fax:  [phone]  Physical Therapy Treatment  Patient Details  Name: Linda Mcgrath MRN: [id number] Date of Birth: [birth year] Referring Provider: [name]  Encounter Date: 04/24/2017      PT End of Session - 04/24/17 1049    Visit Number 10   Date for PT Re-Evaluation 05/14/17   PT Start Time 1010   PT Stop Time 1104   PT Time Calculation (min) 54 min      Past Medical History:  Diagnosis Date  . Allergic rhinitis   . Cataract    Bil/lens implant  . Colon polyp 1999   small polyp  . Diverticulosis   . Fibromyalgia   . Hyperlipidemia   . Hypertension   . Hypothyroidism   . Internal hemorrhoids   . Leg cramps   . Lung nodule    stable LUL 9 mm  . Menopausal syndrome   . Osteoarthritis   . UTI (urinary tract infection)     Past Surgical History:  Procedure Laterality Date  . Abd U/S  11/1998   negative  . Abd U/S  01/2001   gallbladder polyps  . ABDOMINAL HYSTERECTOMY  1975   total-fibroids  . APPENDECTOMY  1975  . BREAST BIOPSY     benign  . BREAST EXCISIONAL BIOPSY Left 1957  . CARDIOVERSION N/A 03/07/2016   Procedure: CARDIOVERSION;  Surgeon: Adrian Prows, MD;  Location: [place];  Service: Cardiovascular;  Laterality: N/A;  . CATARACT EXTRACTION     bilateral  . CHOLECYSTECTOMY    . COLONOSCOPY  1998   Diverticulosis; polyp\  . COLONOSCOPY  09/2002   Diverticulosis, hem  . COLONOSCOPY  12/2007   diverticulosis, polyp  . DEXA  10/2001   osteopenia  . ELECTROPHYSIOLOGIC STUDY N/A 04/03/2016   Procedure: A-Flutter Ablation;  Surgeon: Will Meredith Leeds, MD;  Location: [place] [facility];  Service: Cardiovascular;  Laterality: N/A;  . ESOPHAGOGASTRODUODENOSCOPY  2004  . Hida scan  01/2001   Negative  . KNEE SURGERY     right knee / 11/2004  . laser surgery for glaucoma Left Sep 21, 2014  . ROTATOR CUFF REPAIR      x 2 /right shoulder  . SKIN CANCER EXCISION     pre-melanoma / on face  . TEE WITHOUT CARDIOVERSION N/A 03/07/2016   Procedure: TRANSESOPHAGEAL ECHOCARDIOGRAM (TEE);  Surgeon: Adrian Prows, MD;  Location: [facility] ENDOSCOPY;  Service: Cardiovascular;  Laterality: N/A;    There were no vitals filed for this visit.      Subjective Assessment - 04/24/17 1008    Subjective Pt reports that she is sore all over. Pt reports that it is easier to move   Currently in Pain? Yes   Pain Score 4    Pain Location Back                         [facility] Adult PT Treatment/Exercise - 04/24/17 0001      Lumbar Exercises: Stretches   Passive Hamstring Stretch 4 reps;10 seconds   ITB Stretch 3 reps;10 seconds   Piriformis Stretch 3 reps;10 seconds     Lumbar Exercises: Aerobic   Elliptical NuStep L3 x6 min      Lumbar Exercises: Supine   Heel Slides 5 reps  x2   Bridge 20 reps   Straight Leg Raise 10 reps;2 seconds

## 2017-04-29 ENCOUNTER — Ambulatory Visit: Payer: Medicare Other | Attending: Family Medicine | Admitting: Physical Therapy

## 2017-04-29 ENCOUNTER — Encounter: Payer: Self-pay | Admitting: Physical Therapy

## 2017-04-29 DIAGNOSIS — R262 Difficulty in walking, not elsewhere classified: Secondary | ICD-10-CM | POA: Insufficient documentation

## 2017-04-29 DIAGNOSIS — M62830 Muscle spasm of back: Secondary | ICD-10-CM | POA: Diagnosis not present

## 2017-04-29 DIAGNOSIS — M5441 Lumbago with sciatica, right side: Secondary | ICD-10-CM | POA: Diagnosis not present

## 2017-04-29 NOTE — Therapy (Signed)
[place], [place], [address] Phone: [phone]   Fax:  [phone]  Physical Therapy Treatment  Patient Details  Name: Linda Mcgrath MRN: [id number] Date of Birth: 03-23-1933 Referring Provider: [name]  Encounter Date: 04/29/2017      PT End of Session - 04/29/17 1508    Visit Number 11   Date for PT Re-Evaluation 05/14/17   PT Start Time 1427   PT Stop Time 1521   PT Time Calculation (min) 54 min      Past Medical History:  Diagnosis Date  . Allergic rhinitis   . Cataract    Bil/lens implant  . Colon polyp 1999   small polyp  . Diverticulosis   . Fibromyalgia   . Hyperlipidemia   . Hypertension   . Hypothyroidism   . Internal hemorrhoids   . Leg cramps   . Lung nodule    stable LUL 9 mm  . Menopausal syndrome   . Osteoarthritis   . UTI (urinary tract infection)     Past Surgical History:  Procedure Laterality Date  . Abd U/S  11/1998   negative  . Abd U/S  01/2001   gallbladder polyps  . ABDOMINAL HYSTERECTOMY  1975   total-fibroids  . APPENDECTOMY  1975  . BREAST BIOPSY     benign  . BREAST EXCISIONAL BIOPSY Left 1957  . CARDIOVERSION N/A 03/07/2016   Procedure: CARDIOVERSION;  Surgeon: Adrian Prows, MD;  Location: [place];  Service: Cardiovascular;  Laterality: N/A;  . CATARACT EXTRACTION     bilateral  . CHOLECYSTECTOMY    . COLONOSCOPY  1998   Diverticulosis; polyp\  . COLONOSCOPY  09/2002   Diverticulosis, hem  . COLONOSCOPY  12/2007   diverticulosis, polyp  . DEXA  10/2001   osteopenia  . ELECTROPHYSIOLOGIC STUDY N/A 04/03/2016   Procedure: A-Flutter Ablation;  Surgeon: Will Meredith Leeds, MD;  Location: [place] [facility];  Service: Cardiovascular;  Laterality: N/A;  . ESOPHAGOGASTRODUODENOSCOPY  2004  . Hida scan  01/2001   Negative  . KNEE SURGERY     right knee / 11/2004  . laser surgery for glaucoma Left Sep 21, 2014  . ROTATOR CUFF REPAIR      x 2 /right shoulder  . SKIN CANCER EXCISION     pre-melanoma / on face  . TEE WITHOUT CARDIOVERSION N/A 03/07/2016   Procedure: TRANSESOPHAGEAL ECHOCARDIOGRAM (TEE);  Surgeon: Adrian Prows, MD;  Location: [facility];  Service: Cardiovascular;  Laterality: N/A;    There were no vitals filed for this visit.      Subjective Assessment - 04/29/17 1430    Subjective Pt reports that she is sore all over. PT reports that's when she left after last time her knees did not want to move. She reports having a time on steps and getting out of bed.    Currently in Pain? Yes   Pain Score 6    Pain Location --  back and both hips                          [facility] Adult PT Treatment/Exercise - 04/29/17 0001      Lumbar Exercises: Stretches   Passive Hamstring Stretch 4 reps;20 seconds   Single Knee to Chest Stretch 3 reps;10 seconds   Lower Trunk Rotation 3 reps;10 seconds   ITB Stretch 3 reps;10 seconds     Lumbar Exercises: Supine  PM  [place], [place], [address] Phone: [phone]   Fax:  [phone]  Name: Linda Mcgrath MRN: [id number] Date of Birth: 09-20-1932  [place], [place], [address] Phone: [phone]   Fax:  [phone]  Physical Therapy Treatment  Patient Details  Name: Linda Mcgrath MRN: [id number] Date of Birth: 03-23-1933 Referring Provider: [name]  Encounter Date: 04/29/2017      PT End of Session - 04/29/17 1508    Visit Number 11   Date for PT Re-Evaluation 05/14/17   PT Start Time 1427   PT Stop Time 1521   PT Time Calculation (min) 54 min      Past Medical History:  Diagnosis Date  . Allergic rhinitis   . Cataract    Bil/lens implant  . Colon polyp 1999   small polyp  . Diverticulosis   . Fibromyalgia   . Hyperlipidemia   . Hypertension   . Hypothyroidism   . Internal hemorrhoids   . Leg cramps   . Lung nodule    stable LUL 9 mm  . Menopausal syndrome   . Osteoarthritis   . UTI (urinary tract infection)     Past Surgical History:  Procedure Laterality Date  . Abd U/S  11/1998   negative  . Abd U/S  01/2001   gallbladder polyps  . ABDOMINAL HYSTERECTOMY  1975   total-fibroids  . APPENDECTOMY  1975  . BREAST BIOPSY     benign  . BREAST EXCISIONAL BIOPSY Left 1957  . CARDIOVERSION N/A 03/07/2016   Procedure: CARDIOVERSION;  Surgeon: Adrian Prows, MD;  Location: [place];  Service: Cardiovascular;  Laterality: N/A;  . CATARACT EXTRACTION     bilateral  . CHOLECYSTECTOMY    . COLONOSCOPY  1998   Diverticulosis; polyp\  . COLONOSCOPY  09/2002   Diverticulosis, hem  . COLONOSCOPY  12/2007   diverticulosis, polyp  . DEXA  10/2001   osteopenia  . ELECTROPHYSIOLOGIC STUDY N/A 04/03/2016   Procedure: A-Flutter Ablation;  Surgeon: Will Meredith Leeds, MD;  Location: [place] [facility];  Service: Cardiovascular;  Laterality: N/A;  . ESOPHAGOGASTRODUODENOSCOPY  2004  . Hida scan  01/2001   Negative  . KNEE SURGERY     right knee / 11/2004  . laser surgery for glaucoma Left Sep 21, 2014  . ROTATOR CUFF REPAIR      x 2 /right shoulder  . SKIN CANCER EXCISION     pre-melanoma / on face  . TEE WITHOUT CARDIOVERSION N/A 03/07/2016   Procedure: TRANSESOPHAGEAL ECHOCARDIOGRAM (TEE);  Surgeon: Adrian Prows, MD;  Location: [facility] ENDOSCOPY;  Service: Cardiovascular;  Laterality: N/A;    There were no vitals filed for this visit.      Subjective Assessment - 04/29/17 1430    Subjective Pt reports that she is sore all over. PT reports that's when she left after last time her knees did not want to move. She reports having a time on steps and getting out of bed.    Currently in Pain? Yes   Pain Score 6    Pain Location --  back and both hips                          [facility] Adult PT Treatment/Exercise - 04/29/17 0001      Lumbar Exercises: Stretches   Passive Hamstring Stretch 4 reps;20 seconds   Single Knee to Chest Stretch 3 reps;10 seconds   Lower Trunk Rotation 3 reps;10 seconds   ITB Stretch 3 reps;10 seconds     Lumbar Exercises: Supine

## 2017-05-01 ENCOUNTER — Ambulatory Visit: Payer: Medicare Other | Admitting: Physical Therapy

## 2017-05-01 ENCOUNTER — Encounter: Payer: Self-pay | Admitting: Physical Therapy

## 2017-05-01 DIAGNOSIS — M5441 Lumbago with sciatica, right side: Secondary | ICD-10-CM | POA: Diagnosis not present

## 2017-05-01 DIAGNOSIS — R262 Difficulty in walking, not elsewhere classified: Secondary | ICD-10-CM

## 2017-05-01 DIAGNOSIS — M62830 Muscle spasm of back: Secondary | ICD-10-CM

## 2017-05-01 NOTE — Therapy (Signed)
[facility] [place] [address] [place], [place], [address] Phone: [phone]   Fax:  [phone]  Physical Therapy Treatment  Patient Details  Name: Linda Mcgrath MRN: [id number] Date of Birth: Jun 06, 1933 Referring Provider: Milinda Antis  Encounter Date: 05/01/2017      PT End of Session - 05/01/17 1141    Visit Number 12   Date for PT Re-Evaluation 05/14/17   PT Start Time 1100   PT Stop Time 1155   PT Time Calculation (min) 55 min   Activity Tolerance Patient tolerated treatment well   Behavior During Therapy [facility] for tasks assessed/performed      Past Medical History:  Diagnosis Date  . Allergic rhinitis   . Cataract    Bil/lens implant  . Colon polyp 1999   small polyp  . Diverticulosis   . Fibromyalgia   . Hyperlipidemia   . Hypertension   . Hypothyroidism   . Internal hemorrhoids   . Leg cramps   . Lung nodule    stable LUL 9 mm  . Menopausal syndrome   . Osteoarthritis   . UTI (urinary tract infection)     Past Surgical History:  Procedure Laterality Date  . Abd U/S  11/1998   negative  . Abd U/S  01/2001   gallbladder polyps  . ABDOMINAL HYSTERECTOMY  1975   total-fibroids  . APPENDECTOMY  1975  . BREAST BIOPSY     benign  . BREAST EXCISIONAL BIOPSY Left 1957  . CARDIOVERSION N/A 03/07/2016   Procedure: CARDIOVERSION;  Surgeon: Yates Decamp, MD;  Location: [facility] ENDOSCOPY;  Service: Cardiovascular;  Laterality: N/A;  . CATARACT EXTRACTION     bilateral  . CHOLECYSTECTOMY    . COLONOSCOPY  1998   Diverticulosis; polyp\  . COLONOSCOPY  09/2002   Diverticulosis, hem  . COLONOSCOPY  12/2007   diverticulosis, polyp  . DEXA  10/2001   osteopenia  . ELECTROPHYSIOLOGIC STUDY N/A 04/03/2016   Procedure: A-Flutter Ablation;  Surgeon: Will [name], MD;  [facility] INVASIVE CV LAB;  Service: Cardiovascular;  Laterality: N/A;  . ESOPHAGOGASTRODUODENOSCOPY  2004  . Hida scan  01/2001   Negative   . KNEE SURGERY     right knee / 11/2004  . laser surgery for glaucoma Left Sep 21, 2014  . ROTATOR CUFF REPAIR     x 2 /right shoulder  . SKIN CANCER EXCISION     pre-melanoma / on face  . TEE WITHOUT CARDIOVERSION N/A 03/07/2016   Procedure: TRANSESOPHAGEAL ECHOCARDIOGRAM (TEE);  Surgeon: Yates Decamp, MD;  Location: [facility] ENDOSCOPY;  Service: Cardiovascular;  Laterality: N/A;    There were no vitals filed for this visit.      Subjective Assessment - 05/01/17 1101    Subjective Pt reports  that she has the same pain in her back. Pt reports some soreness in lateral thigh in both sides   Currently in Pain? Yes   Pain Score 5    Pain Location Back   Pain Orientation Lower                         [facility] Adult PT Treatment/Exercise - 05/01/17 0001      Lumbar Exercises: Stretches   Passive Hamstring Stretch 4 reps;20 seconds     Lumbar Exercises: Aerobic   Stationary Bike L0 x6 min     Lumbar Exercises: Seated   Long Arc Quad on Chair Both;2 sets;10 reps  LAQ on Chair Weights (lbs) 2   Other Seated Lumbar Exercises HS curls red 2x10     Lumbar Exercises: Supine   Bridge 20 reps   Straight Leg Raise 10 reps;2 seconds   Large Ball Abdominal Isometric 20 reps;2 seconds   Other Supine Lumbar Exercises hip abd/add 2x5 each.     Moist Heat Therapy   Number Minutes Moist Heat 15 Minutes   Moist Heat Location Lumbar Spine;Hip     Electrical Stimulation   Electrical Stimulation Location L/S spine   Electrical Stimulation Action IFC   Electrical Stimulation Parameters sitting   Electrical Stimulation Goals Pain                  PT Short Term Goals - 04/07/17 1053      PT SHORT TERM GOAL #1   Title independent with HEP   Status Achieved           PT Long Term Goals - 05/01/17 1141      PT LONG TERM GOAL #1   Title decrease pain 50%   Status On-going     PT LONG TERM GOAL #2   Title Increase lumbar AROM 25% in extension, side-bending, and  rotation.    Status Partially Met     PT LONG TERM GOAL #3   Title Understand proper posture and body mechanics for housework and yardwork tasks.    Status Partially Met     PT LONG TERM GOAL #4   Title 25% reported less difficulty sleeping   Status Achieved     PT LONG TERM GOAL #5   Title Independent with advanced HEP.    Status Partially Met               Plan - 05/01/17 1142    Clinical Impression Statement Attempted some seated LE isolation exercises, repots her usual back pain but soreness has decreased. She performed all supine exercises well.   Rehab Potential Good   PT Frequency 2x / week   PT Duration 8 weeks   PT Treatment/Interventions ADLs/Self Care Home Management;Cryotherapy;Electrical Stimulation;Moist Heat;Iontophoresis 4mg /ml Dexamethasone;Neuromuscular re-education;Therapeutic exercise;Therapeutic activities;Functional mobility training;Patient/family education;Manual techniques;Passive range of motion   PT Next Visit Plan continue to work on strength and function and see if we can progress without increase of pain      Patient will benefit from skilled therapeutic intervention in order to improve the following deficits and impairments:  Decreased activity tolerance, Decreased mobility, Decreased endurance, Cardiopulmonary status limiting activity, Decreased range of motion, Decreased strength, Postural dysfunction, Improper body mechanics, Impaired flexibility, Pain, Increased muscle spasms, Difficulty walking  Visit Diagnosis: Muscle spasm of back  Acute right-sided low back pain with right-sided sciatica  Difficulty in walking, not elsewhere classified     Problem List Patient Active Problem List   Diagnosis Date Noted  . ETD (eustachian tube dysfunction) 03/17/2017  . Low back pain with right-sided sciatica 03/04/2017  . Tick bite of calf 01/28/2017  . Abscess and cellulitis of gluteal region 08/07/2016  . Atrial flutter (HCC) 03/06/2016  .  Severe headache 03/04/2016  . Obesity 11/06/2015  . Urge incontinence 01/24/2015  . Estrogen deficiency 10/25/2014  . Lichen planus 12/20/2013  . Encounter for Medicare annual wellness exam 06/17/2013  . Aphthous ulcer of mouth 03/16/2013  . Screening mammogram, encounter for 06/15/2012  . Hyperglycemia 06/03/2011  . HYPERTENSION, BENIGN ESSENTIAL 10/23/2010  . CONSTIPATION 10/23/2010  . HEMATOCHEZIA 10/23/2010  . OSTEOARTHRITIS, HANDS, BILATERAL 01/24/2010  . Hypothyroidism  08/24/2008  . Vitamin D deficiency 06/06/2008  . Hyperlipidemia 06/06/2008  . MENOPAUSAL SYNDROME 03/29/2008  . Osteopenia 03/29/2008  . ALLERGIC RHINITIS 01/25/2008  . IBS 01/25/2008  . OSTEOARTHRITIS 01/25/2008  . FIBROMYALGIA 01/25/2008    Grayce Sessions, PTA 05/01/2017, 11:43 AM  [facility]- [address] [place] [address] [place], [place], [address] Phone: [phone]   Fax:  [phone]  Name: Linda Mcgrath MRN: [id number] Date of Birth: 1933/01/22

## 2017-05-05 ENCOUNTER — Ambulatory Visit: Payer: Medicare Other | Admitting: Physical Therapy

## 2017-05-05 ENCOUNTER — Encounter: Payer: Self-pay | Admitting: Physical Therapy

## 2017-05-05 DIAGNOSIS — M5441 Lumbago with sciatica, right side: Secondary | ICD-10-CM

## 2017-05-05 DIAGNOSIS — M62830 Muscle spasm of back: Secondary | ICD-10-CM

## 2017-05-05 DIAGNOSIS — R262 Difficulty in walking, not elsewhere classified: Secondary | ICD-10-CM | POA: Diagnosis not present

## 2017-05-05 NOTE — Therapy (Signed)
[facility] [place] [address] [place], [place], [address] Phone: [phone]   Fax:  [phone]  Physical Therapy Treatment  Patient Details  Name: Linda Mcgrath MRN: [id number] Date of Birth: 03-13-1933 Referring Provider: Milinda Antis  Encounter Date: 05/05/2017      PT End of Session - 05/05/17 1040    Visit Number 13   Date for PT Re-Evaluation 05/14/17   PT Start Time 1005   PT Stop Time 1055   PT Time Calculation (min) 50 min   Activity Tolerance Patient limited by pain   Behavior During Therapy [facility] for tasks assessed/performed      Past Medical History:  Diagnosis Date  . Allergic rhinitis   . Cataract    Bil/lens implant  . Colon polyp 1999   small polyp  . Diverticulosis   . Fibromyalgia   . Hyperlipidemia   . Hypertension   . Hypothyroidism   . Internal hemorrhoids   . Leg cramps   . Lung nodule    stable LUL 9 mm  . Menopausal syndrome   . Osteoarthritis   . UTI (urinary tract infection)     Past Surgical History:  Procedure Laterality Date  . Abd U/S  11/1998   negative  . Abd U/S  01/2001   gallbladder polyps  . ABDOMINAL HYSTERECTOMY  1975   total-fibroids  . APPENDECTOMY  1975  . BREAST BIOPSY     benign  . BREAST EXCISIONAL BIOPSY Left 1957  . CARDIOVERSION N/A 03/07/2016   Procedure: CARDIOVERSION;  Surgeon: Yates Decamp, MD;  Location: [facility] ENDOSCOPY;  Service: Cardiovascular;  Laterality: N/A;  . CATARACT EXTRACTION     bilateral  . CHOLECYSTECTOMY    . COLONOSCOPY  1998   Diverticulosis; polyp\  . COLONOSCOPY  09/2002   Diverticulosis, hem  . COLONOSCOPY  12/2007   diverticulosis, polyp  . DEXA  10/2001   osteopenia  . ELECTROPHYSIOLOGIC STUDY N/A 04/03/2016   Procedure: A-Flutter Ablation;  Surgeon: Will [name], MD;  [facility] INVAS[facility] CV LAB;  Service: Cardiovascular;  Laterality: N/A;  . ESOPHAGOGASTRODUODENOSCOPY  2004  . Hida scan  01/2001   Negative  . KNEE  SURGERY     right knee / 11/2004  . laser surgery for glaucoma Left Sep 21, 2014  . ROTATOR CUFF REPAIR     x 2 /right shoulder  . SKIN CANCER EXCISION     pre-melanoma / on face  . TEE WITHOUT CARDIOVERSION N/A 03/07/2016   Procedure: TRANSESOPHAGEAL ECHOCARDIOGRAM (TEE);  Surgeon: Yates Decamp, MD;  Location: [facility] ENDOSCOPY;  Service: Cardiovascular;  Laterality: N/A;    There were no vitals filed for this visit.      Subjective Assessment - 05/05/17 1036    Subjective Patient reports over the last two PT visits her pain has gotten worse, pain in the left buttock and into the left posterior leg   Currently in Pain? Yes   Pain Score 7    Pain Location Hip   Pain Orientation Left;Posterior   Pain Radiating Towards pain going down the left postreior leg   Aggravating Factors  sitting, bending, the exercises in PT   Pain Relieving Factors heat                         [facility] Adult PT Treatment/Exercise - 05/05/17 0001      Modalities   Modalities Traction;Ultrasound     Moist Heat  Therapy   Number Minutes Moist Heat 15 Minutes   Moist Heat Location Lumbar Spine;Hip     Electrical Stimulation   Electrical Stimulation Location left buttock   Electrical Stimulation Action IFC   Electrical Stimulation Parameters supine during traction   Electrical Stimulation Goals Pain     Ultrasound   Ultrasound Location US/estim combo to the left buttock   Ultrasound Parameters estim to tolerance, [place] 100% 1.5w/cm2   Ultrasound Goals Pain     Traction   Type of Traction Lumbar   Max (lbs) 45#   Hold Time static   Time 15     Manual Therapy   Soft tissue mobilization tried some soft tissue mobilization to the left buttock but she was "too tender"                  PT Short Term Goals - 04/07/17 1053      PT SHORT TERM GOAL #1   Title independent with HEP   Status Achieved           PT Long Term Goals - 05/01/17 1141      PT LONG TERM GOAL #1   Title  decrease pain 50%   Status On-going     PT LONG TERM GOAL #2   Title Increase lumbar AROM 25% in extension, side-bending, and rotation.    Status Partially Met     PT LONG TERM GOAL #3   Title Understand proper posture and body mechanics for housework and yardwork tasks.    Status Partially Met     PT LONG TERM GOAL #4   Title 25% reported less difficulty sleeping   Status Achieved     PT LONG TERM GOAL #5   Title Independent with advanced HEP.    Status Partially Met               Plan - 05/05/17 1041    Clinical Impression Statement Patient has had increased left buttock and leg pain with last few PT treatments, reporting increased pain and more pain into the left leg.   PT Next Visit Plan see if todays treatment helps, if no changes may refer back to MD   Consulted and Agree with Plan of Care Patient      Patient will benefit from skilled therapeutic intervention in order to improve the following deficits and impairments:  Decreased activity tolerance, Decreased mobility, Decreased endurance, Cardiopulmonary status limiting activity, Decreased range of motion, Decreased strength, Postural dysfunction, Improper body mechanics, Impaired flexibility, Pain, Increased muscle spasms, Difficulty walking  Visit Diagnosis: Muscle spasm of back  Acute right-sided low back pain with right-sided sciatica  Difficulty in walking, not elsewhere classified     Problem List Patient Active Problem List   Diagnosis Date Noted  . ETD (eustachian tube dysfunction) 03/17/2017  . Low back pain with right-sided sciatica 03/04/2017  . Tick bite of calf 01/28/2017  . Abscess and cellulitis of gluteal region 08/07/2016  . Atrial flutter (HCC) 03/06/2016  . Severe headache 03/04/2016  . Obesity 11/06/2015  . Urge incontinence 01/24/2015  . Estrogen deficiency 10/25/2014  . Lichen planus 12/20/2013  . Encounter for Medicare annual wellness exam 06/17/2013  . Aphthous ulcer of mouth  03/16/2013  . Screening mammogram, encounter for 06/15/2012  . Hyperglycemia 06/03/2011  . HYPERTENSION, BENIGN ESSENTIAL 10/23/2010  . CONSTIPATION 10/23/2010  . HEMATOCHEZIA 10/23/2010  . OSTEOARTHRITIS, HANDS, BILATERAL 01/24/2010  . Hypothyroidism 08/24/2008  . Vitamin D deficiency 06/06/2008  . Hyperlipidemia  06/06/2008  . MENOPAUSAL SYNDROME 03/29/2008  . Osteopenia 03/29/2008  . ALLERGIC RHINITIS 01/25/2008  . IBS 01/25/2008  . OSTEOARTHRITIS 01/25/2008  . FIBROMYALGIA 01/25/2008    Jearld Lesch., PT 05/05/2017, 10:42 AM  [facility] [address] [place], [place], [address] Phone: [phone]   Fax:  [phone]  Name: Linda Mcgrath MRN: [id number] Date of Birth: Oct 28, 1932

## 2017-05-07 ENCOUNTER — Encounter: Payer: Self-pay | Admitting: Physical Therapy

## 2017-05-07 ENCOUNTER — Ambulatory Visit: Payer: Medicare Other | Admitting: Physical Therapy

## 2017-05-07 DIAGNOSIS — R262 Difficulty in walking, not elsewhere classified: Secondary | ICD-10-CM | POA: Diagnosis not present

## 2017-05-07 DIAGNOSIS — M62830 Muscle spasm of back: Secondary | ICD-10-CM | POA: Diagnosis not present

## 2017-05-07 DIAGNOSIS — M5441 Lumbago with sciatica, right side: Secondary | ICD-10-CM

## 2017-05-07 NOTE — Therapy (Signed)
[place], [place], [address] Phone: [phone]   Fax:  [phone]  Physical Therapy Treatment  Patient Details  Name: Linda Mcgrath MRN: [id number] Date of Birth: May 06, 1933 Referring Provider: [name]  Encounter Date: 05/07/2017      PT End of Session - 05/07/17 1136    Visit Number 14   Date for PT Re-Evaluation 05/14/17   PT Start Time 1004   PT Stop Time 1055   PT Time Calculation (min) 51 min   Activity Tolerance Patient limited by pain   Behavior During Therapy [facility] for tasks assessed/performed      Past Medical History:  Diagnosis Date  . Allergic rhinitis   . Cataract    Bil/lens implant  . Colon polyp 1999   small polyp  . Diverticulosis   . Fibromyalgia   . Hyperlipidemia   . Hypertension   . Hypothyroidism   . Internal hemorrhoids   . Leg cramps   . Lung nodule    stable LUL 9 mm  . Menopausal syndrome   . Osteoarthritis   . UTI (urinary tract infection)     Past Surgical History:  Procedure Laterality Date  . Abd U/S  11/1998   negative  . Abd U/S  01/2001   gallbladder polyps  . ABDOMINAL HYSTERECTOMY  1975   total-fibroids  . APPENDECTOMY  1975  . BREAST BIOPSY     benign  . BREAST EXCISIONAL BIOPSY Left 1957  . CARDIOVERSION N/A 03/07/2016   Procedure: CARDIOVERSION;  Surgeon: Adrian Prows, MD;  Location: [place];  Service: Cardiovascular;  Laterality: N/A;  . CATARACT EXTRACTION     bilateral  . CHOLECYSTECTOMY    . COLONOSCOPY  1998   Diverticulosis; polyp\  . COLONOSCOPY  09/2002   Diverticulosis, hem  . COLONOSCOPY  12/2007   diverticulosis, polyp  . DEXA  10/2001   osteopenia  . ELECTROPHYSIOLOGIC STUDY N/A 04/03/2016   Procedure: A-Flutter Ablation;  Surgeon: Will Meredith Leeds, MD;  Location: [place] CV LAB;  Service: Cardiovascular;  Laterality: N/A;  . ESOPHAGOGASTRODUODENOSCOPY  2004  . Hida scan  01/2001   Negative  . KNEE  SURGERY     right knee / 11/2004  . laser surgery for glaucoma Left Sep 21, 2014  . ROTATOR CUFF REPAIR     x 2 /right shoulder  . SKIN CANCER EXCISION     pre-melanoma / on face  . TEE WITHOUT CARDIOVERSION N/A 03/07/2016   Procedure: TRANSESOPHAGEAL ECHOCARDIOGRAM (TEE);  Surgeon: Adrian Prows, MD;  Location: [facility] ENDOSCOPY;  Service: Cardiovascular;  Laterality: N/A;    There were no vitals filed for this visit.      Subjective Assessment - 05/07/17 1042    Subjective I am not as bad as the other day, I think the traction helped   Currently in Pain? Yes   Pain Score 5    Pain Location Hip   Pain Orientation Left;Posterior                         [facility] Adult PT Treatment/Exercise - 05/07/17 0001      Moist Heat Therapy   Number Minutes Moist Heat 15 Minutes   Moist Heat Location Lumbar Spine;Hip     Electrical Stimulation   Electrical Stimulation Location left buttock   Electrical Stimulation Action IFC   Electrical Stimulation Parameters supine during traction   Electrical Stimulation Goals  Pain     Ultrasound   Ultrasound Location US/estim combo   Ultrasound Parameters as previous   Ultrasound Goals Pain     Traction   Type of Traction Lumbar   Max (lbs) 50#   Hold Time static   Time 15     Manual Therapy   Manual therapy comments gentle PROM stretch for HS and piriformis                  PT Short Term Goals - 04/07/17 1053      PT SHORT TERM GOAL #1   Title independent with HEP   Status Achieved           PT Long Term Goals - 05/07/17 1138      PT LONG TERM GOAL #1   Title decrease pain 50%   Status Partially Met     PT LONG TERM GOAL #2   Title Increase lumbar AROM 25% in extension, side-bending, and rotation.    Status Partially Met     PT LONG TERM GOAL #3   Title Understand proper posture and body mechanics for housework and yardwork tasks.    Status Achieved               Plan - 05/07/17 1137     Clinical Impression Statement Continues to have some increased back pain but reports " I am much better than the last day I was here, I think the traction may have helped".  She has mutiple joint issues and comorbidities that are influencing her recovery and rehab   PT Next Visit Plan may continue current plan to see if we can get her feeling better   Consulted and Agree with Plan of Care Patient      Patient will benefit from skilled therapeutic intervention in order to improve the following deficits and impairments:  Decreased activity tolerance, Decreased mobility, Decreased endurance, Cardiopulmonary status limiting activity, Decreased range of motion, Decreased strength, Postural dysfunction, Improper body mechanics, Impaired flexibility, Pain, Increased muscle spasms, Difficulty walking  Visit Diagnosis: Muscle spasm of back  Acute right-sided low back pain with right-sided sciatica  Difficulty in walking, not elsewhere classified     Problem List Patient Active Problem List   Diagnosis Date Noted  . ETD (eustachian tube dysfunction) 03/17/2017  . Low back pain with right-sided sciatica 03/04/2017  . Tick bite of calf 01/28/2017  . Abscess and cellulitis of gluteal region 08/07/2016  . Atrial flutter ([place]) 03/06/2016  . Severe headache 03/04/2016  . Obesity 11/06/2015  . Urge incontinence 01/24/2015  . Estrogen deficiency 10/25/2014  . Lichen planus 41/96/2229  . Encounter for Medicare annual wellness exam 06/17/2013  . Aphthous ulcer of mouth 03/16/2013  . Screening mammogram, encounter for 06/15/2012  . Hyperglycemia 06/03/2011  . HYPERTENSION, BENIGN ESSENTIAL 10/23/2010  . CONSTIPATION 10/23/2010  . HEMATOCHEZIA 10/23/2010  . OSTEOARTHRITIS, HANDS, BILATERAL 01/24/2010  . Hypothyroidism 08/24/2008  . Vitamin D deficiency 06/06/2008  . Hyperlipidemia 06/06/2008  . MENOPAUSAL SYNDROME 03/29/2008  . Osteopenia 03/29/2008  . ALLERGIC RHINITIS 01/25/2008  . IBS  01/25/2008  . OSTEOARTHRITIS 01/25/2008  . FIBROMYALGIA 01/25/2008    [name], PT 05/07/2017, 11:39 AM  [place], [place], [address] Phone: [phone]   Fax:  [phone]  Name: Linda Mcgrath MRN: [id number] Date of Birth: 01/10/1933

## 2017-05-12 ENCOUNTER — Encounter: Payer: Self-pay | Admitting: Physical Therapy

## 2017-05-12 ENCOUNTER — Ambulatory Visit: Payer: Medicare Other | Admitting: Physical Therapy

## 2017-05-12 DIAGNOSIS — R262 Difficulty in walking, not elsewhere classified: Secondary | ICD-10-CM | POA: Diagnosis not present

## 2017-05-12 DIAGNOSIS — H401131 Primary open-angle glaucoma, bilateral, mild stage: Secondary | ICD-10-CM | POA: Diagnosis not present

## 2017-05-12 DIAGNOSIS — M62830 Muscle spasm of back: Secondary | ICD-10-CM | POA: Diagnosis not present

## 2017-05-12 DIAGNOSIS — M5441 Lumbago with sciatica, right side: Secondary | ICD-10-CM

## 2017-05-12 NOTE — Therapy (Signed)
[place], [place], [address] Phone: [phone]   Fax:  [phone]  Physical Therapy Treatment  Patient Details  Name: Linda Mcgrath MRN: [id number] Date of Birth: 1933/08/22 Referring Provider: [name]  Encounter Date: 05/12/2017      PT End of Session - 05/12/17 1339    Visit Number 15   Date for PT Re-Evaluation 05/14/17   PT Start Time 1314   PT Stop Time 1400   PT Time Calculation (min) 46 min   Activity Tolerance Patient tolerated treatment well   Behavior During Therapy [facility] for tasks assessed/performed      Past Medical History:  Diagnosis Date  . Allergic rhinitis   . Cataract    Bil/lens implant  . Colon polyp 1999   small polyp  . Diverticulosis   . Fibromyalgia   . Hyperlipidemia   . Hypertension   . Hypothyroidism   . Internal hemorrhoids   . Leg cramps   . Lung nodule    stable LUL 9 mm  . Menopausal syndrome   . Osteoarthritis   . UTI (urinary tract infection)     Past Surgical History:  Procedure Laterality Date  . Abd U/S  11/1998   negative  . Abd U/S  01/2001   gallbladder polyps  . ABDOMINAL HYSTERECTOMY  1975   total-fibroids  . APPENDECTOMY  1975  . BREAST BIOPSY     benign  . BREAST EXCISIONAL BIOPSY Left 1957  . CARDIOVERSION N/A 03/07/2016   Procedure: CARDIOVERSION;  Surgeon: Adrian Prows, MD;  Location: [place];  Service: Cardiovascular;  Laterality: N/A;  . CATARACT EXTRACTION     bilateral  . CHOLECYSTECTOMY    . COLONOSCOPY  1998   Diverticulosis; polyp\  . COLONOSCOPY  09/2002   Diverticulosis, hem  . COLONOSCOPY  12/2007   diverticulosis, polyp  . DEXA  10/2001   osteopenia  . ELECTROPHYSIOLOGIC STUDY N/A 04/03/2016   Procedure: A-Flutter Ablation;  Surgeon: Will Meredith Leeds, MD;  Location: [place] CV [facility];  Service: Cardiovascular;  Laterality: N/A;  . ESOPHAGOGASTRODUODENOSCOPY  2004  . Hida scan  01/2001   Negative   . KNEE SURGERY     right knee / 11/2004  . laser surgery for glaucoma Left Sep 21, 2014  . ROTATOR CUFF REPAIR     x 2 /right shoulder  . SKIN CANCER EXCISION     pre-melanoma / on face  . TEE WITHOUT CARDIOVERSION N/A 03/07/2016   Procedure: TRANSESOPHAGEAL ECHOCARDIOGRAM (TEE);  Surgeon: Adrian Prows, MD;  Location: [facility] ENDOSCOPY;  Service: Cardiovascular;  Laterality: N/A;    There were no vitals filed for this visit.      Subjective Assessment - 05/12/17 1310    Subjective Reports that she had a bad night last night, reports that the current treatment has seemed to help ease the pain.  She does repor that she had to sit a long time with an eye doctor appointment this AM   Currently in Pain? Yes   Pain Score 5    Pain Location Buttocks   Pain Orientation Left   Pain Descriptors / Indicators Sore   Pain Radiating Towards some pain in the left posterior/lateral leg                         [facility] Adult PT Treatment/Exercise - 05/12/17 0001      Lumbar Exercises: Stretches  [place], [place], [address] Phone: [phone]   Fax:  [phone]  Physical Therapy Treatment  Patient Details  Name: Linda Mcgrath MRN: [id number] Date of Birth: 1933/08/22 Referring Provider: [name]  Encounter Date: 05/12/2017      PT End of Session - 05/12/17 1339    Visit Number 15   Date for PT Re-Evaluation 05/14/17   PT Start Time 1314   PT Stop Time 1400   PT Time Calculation (min) 46 min   Activity Tolerance Patient tolerated treatment well   Behavior During Therapy [facility] for tasks assessed/performed      Past Medical History:  Diagnosis Date  . Allergic rhinitis   . Cataract    Bil/lens implant  . Colon polyp 1999   small polyp  . Diverticulosis   . Fibromyalgia   . Hyperlipidemia   . Hypertension   . Hypothyroidism   . Internal hemorrhoids   . Leg cramps   . Lung nodule    stable LUL 9 mm  . Menopausal syndrome   . Osteoarthritis   . UTI (urinary tract infection)     Past Surgical History:  Procedure Laterality Date  . Abd U/S  11/1998   negative  . Abd U/S  01/2001   gallbladder polyps  . ABDOMINAL HYSTERECTOMY  1975   total-fibroids  . APPENDECTOMY  1975  . BREAST BIOPSY     benign  . BREAST EXCISIONAL BIOPSY Left 1957  . CARDIOVERSION N/A 03/07/2016   Procedure: CARDIOVERSION;  Surgeon: Adrian Prows, MD;  Location: [place];  Service: Cardiovascular;  Laterality: N/A;  . CATARACT EXTRACTION     bilateral  . CHOLECYSTECTOMY    . COLONOSCOPY  1998   Diverticulosis; polyp\  . COLONOSCOPY  09/2002   Diverticulosis, hem  . COLONOSCOPY  12/2007   diverticulosis, polyp  . DEXA  10/2001   osteopenia  . ELECTROPHYSIOLOGIC STUDY N/A 04/03/2016   Procedure: A-Flutter Ablation;  Surgeon: Will Meredith Leeds, MD;  Location: [place] [facility];  Service: Cardiovascular;  Laterality: N/A;  . ESOPHAGOGASTRODUODENOSCOPY  2004  . Hida scan  01/2001   Negative   . KNEE SURGERY     right knee / 11/2004  . laser surgery for glaucoma Left Sep 21, 2014  . ROTATOR CUFF REPAIR     x 2 /right shoulder  . SKIN CANCER EXCISION     pre-melanoma / on face  . TEE WITHOUT CARDIOVERSION N/A 03/07/2016   Procedure: TRANSESOPHAGEAL ECHOCARDIOGRAM (TEE);  Surgeon: Adrian Prows, MD;  Location: [facility] ENDOSCOPY;  Service: Cardiovascular;  Laterality: N/A;    There were no vitals filed for this visit.      Subjective Assessment - 05/12/17 1310    Subjective Reports that she had a bad night last night, reports that the current treatment has seemed to help ease the pain.  She does repor that she had to sit a long time with an eye doctor appointment this AM   Currently in Pain? Yes   Pain Score 5    Pain Location Buttocks   Pain Orientation Left   Pain Descriptors / Indicators Sore   Pain Radiating Towards some pain in the left posterior/lateral leg                         [facility] Adult PT Treatment/Exercise - 05/12/17 0001      Lumbar Exercises: Stretches  Name: AMIRRAH QUIGLEY MRN: [id number] Date of Birth: [birth year]

## 2017-05-14 ENCOUNTER — Encounter: Payer: Self-pay | Admitting: Physical Therapy

## 2017-05-14 ENCOUNTER — Ambulatory Visit: Payer: Medicare Other | Admitting: Physical Therapy

## 2017-05-14 DIAGNOSIS — M5441 Lumbago with sciatica, right side: Secondary | ICD-10-CM

## 2017-05-14 DIAGNOSIS — M62830 Muscle spasm of back: Secondary | ICD-10-CM | POA: Diagnosis not present

## 2017-05-14 DIAGNOSIS — R262 Difficulty in walking, not elsewhere classified: Secondary | ICD-10-CM

## 2017-05-14 NOTE — Therapy (Signed)
Hypothyroidism 08/24/2008  . Vitamin D deficiency 06/06/2008  . Hyperlipidemia 06/06/2008  . MENOPAUSAL SYNDROME 03/29/2008  . Osteopenia 03/29/2008  . ALLERGIC RHINITIS 01/25/2008  . IBS 01/25/2008  . OSTEOARTHRITIS 01/25/2008  . FIBROMYALGIA 01/25/2008    [name], PT 05/14/2017, 10:51 AM  [place] [address] [address] [place], [place], [address] Phone: [phone]   Fax:  [phone]  Name: Linda Mcgrath MRN: [id number] Date of Birth: 05-Jun-1933  [place], [place], [address] Phone: [phone]   Fax:  [phone]  Physical Therapy Treatment  Patient Details  Name: Linda Mcgrath MRN: [id number] Date of Birth: 1932-12-11 Referring Provider: [name]  Encounter Date: 05/14/2017      PT End of Session - 05/14/17 1046    Visit Number 16   Date for PT Re-Evaluation 06/13/17   PT Start Time 5038   PT Stop Time 1114   PT Time Calculation (min) 59 min   Activity Tolerance Patient tolerated treatment well   Behavior During Therapy [facility] for tasks assessed/performed      Past Medical History:  Diagnosis Date  . Allergic rhinitis   . Cataract    Bil/lens implant  . Colon polyp 1999   small polyp  . Diverticulosis   . Fibromyalgia   . Hyperlipidemia   . Hypertension   . Hypothyroidism   . Internal hemorrhoids   . Leg cramps   . Lung nodule    stable LUL 9 mm  . Menopausal syndrome   . Osteoarthritis   . UTI (urinary tract infection)     Past Surgical History:  Procedure Laterality Date  . Abd U/S  11/1998   negative  . Abd U/S  01/2001   gallbladder polyps  . ABDOMINAL HYSTERECTOMY  1975   total-fibroids  . APPENDECTOMY  1975  . BREAST BIOPSY     benign  . BREAST EXCISIONAL BIOPSY Left 1957  . CARDIOVERSION N/A 03/07/2016   Procedure: CARDIOVERSION;  Surgeon: Adrian Prows, MD;  Location: [place];  Service: Cardiovascular;  Laterality: N/A;  . CATARACT EXTRACTION     bilateral  . CHOLECYSTECTOMY    . COLONOSCOPY  1998   Diverticulosis; polyp\  . COLONOSCOPY  09/2002   Diverticulosis, hem  . COLONOSCOPY  12/2007   diverticulosis, polyp  . DEXA  10/2001   osteopenia  . ELECTROPHYSIOLOGIC STUDY N/A 04/03/2016   Procedure: A-Flutter Ablation;  Surgeon: Will Meredith Leeds, MD;  Location: [place] [facility];  Service: Cardiovascular;  Laterality: N/A;  . ESOPHAGOGASTRODUODENOSCOPY  2004  . Hida scan  01/2001   Negative   . KNEE SURGERY     right knee / 11/2004  . laser surgery for glaucoma Left Sep 21, 2014  . ROTATOR CUFF REPAIR     x 2 /right shoulder  . SKIN CANCER EXCISION     pre-melanoma / on face  . TEE WITHOUT CARDIOVERSION N/A 03/07/2016   Procedure: TRANSESOPHAGEAL ECHOCARDIOGRAM (TEE);  Surgeon: Adrian Prows, MD;  Location: [facility] ENDOSCOPY;  Service: Cardiovascular;  Laterality: N/A;    There were no vitals filed for this visit.      Subjective Assessment - 05/14/17 1043    Subjective Reports a little better today, reports that she has to have help getting her socks on secondary to her not being able to cross the left leg   Currently in Pain? Yes   Pain Score 6    Pain Location Hip   Pain Orientation Left;Lateral   Aggravating Factors  trying to cross left leg over right to put socks on                         [facility] Adult PT Treatment/Exercise - 05/14/17 0001      Modalities   Modalities Iontophoresis     Moist Heat Therapy   Number Minutes Moist Heat 15 Minutes  [place], [place], [address] Phone: [phone]   Fax:  [phone]  Physical Therapy Treatment  Patient Details  Name: Linda Mcgrath MRN: [id number] Date of Birth: 1932-12-11 Referring Provider: [name]  Encounter Date: 05/14/2017      PT End of Session - 05/14/17 1046    Visit Number 16   Date for PT Re-Evaluation 06/13/17   PT Start Time 5038   PT Stop Time 1114   PT Time Calculation (min) 59 min   Activity Tolerance Patient tolerated treatment well   Behavior During Therapy [facility] for tasks assessed/performed      Past Medical History:  Diagnosis Date  . Allergic rhinitis   . Cataract    Bil/lens implant  . Colon polyp 1999   small polyp  . Diverticulosis   . Fibromyalgia   . Hyperlipidemia   . Hypertension   . Hypothyroidism   . Internal hemorrhoids   . Leg cramps   . Lung nodule    stable LUL 9 mm  . Menopausal syndrome   . Osteoarthritis   . UTI (urinary tract infection)     Past Surgical History:  Procedure Laterality Date  . Abd U/S  11/1998   negative  . Abd U/S  01/2001   gallbladder polyps  . ABDOMINAL HYSTERECTOMY  1975   total-fibroids  . APPENDECTOMY  1975  . BREAST BIOPSY     benign  . BREAST EXCISIONAL BIOPSY Left 1957  . CARDIOVERSION N/A 03/07/2016   Procedure: CARDIOVERSION;  Surgeon: Adrian Prows, MD;  Location: [place];  Service: Cardiovascular;  Laterality: N/A;  . CATARACT EXTRACTION     bilateral  . CHOLECYSTECTOMY    . COLONOSCOPY  1998   Diverticulosis; polyp\  . COLONOSCOPY  09/2002   Diverticulosis, hem  . COLONOSCOPY  12/2007   diverticulosis, polyp  . DEXA  10/2001   osteopenia  . ELECTROPHYSIOLOGIC STUDY N/A 04/03/2016   Procedure: A-Flutter Ablation;  Surgeon: Will Meredith Leeds, MD;  Location: [place] [facility];  Service: Cardiovascular;  Laterality: N/A;  . ESOPHAGOGASTRODUODENOSCOPY  2004  . Hida scan  01/2001   Negative   . KNEE SURGERY     right knee / 11/2004  . laser surgery for glaucoma Left Sep 21, 2014  . ROTATOR CUFF REPAIR     x 2 /right shoulder  . SKIN CANCER EXCISION     pre-melanoma / on face  . TEE WITHOUT CARDIOVERSION N/A 03/07/2016   Procedure: TRANSESOPHAGEAL ECHOCARDIOGRAM (TEE);  Surgeon: Adrian Prows, MD;  Location: [facility] ENDOSCOPY;  Service: Cardiovascular;  Laterality: N/A;    There were no vitals filed for this visit.      Subjective Assessment - 05/14/17 1043    Subjective Reports a little better today, reports that she has to have help getting her socks on secondary to her not being able to cross the left leg   Currently in Pain? Yes   Pain Score 6    Pain Location Hip   Pain Orientation Left;Lateral   Aggravating Factors  trying to cross left leg over right to put socks on                         [facility] Adult PT Treatment/Exercise - 05/14/17 0001      Modalities   Modalities Iontophoresis     Moist Heat Therapy   Number Minutes Moist Heat 15 Minutes

## 2017-05-19 ENCOUNTER — Encounter: Payer: Self-pay | Admitting: Physical Therapy

## 2017-05-19 ENCOUNTER — Ambulatory Visit: Payer: Medicare Other | Admitting: Physical Therapy

## 2017-05-19 DIAGNOSIS — M62830 Muscle spasm of back: Secondary | ICD-10-CM | POA: Diagnosis not present

## 2017-05-19 DIAGNOSIS — R262 Difficulty in walking, not elsewhere classified: Secondary | ICD-10-CM | POA: Diagnosis not present

## 2017-05-19 DIAGNOSIS — M5441 Lumbago with sciatica, right side: Secondary | ICD-10-CM | POA: Diagnosis not present

## 2017-05-19 NOTE — Therapy (Signed)
Fax:  [phone]  Name: Linda Mcgrath MRN: [id number] Date of Birth: November 06, 1932  Fax:  [phone]  Name: Linda Mcgrath MRN: [id number] Date of Birth: November 06, 1932  [place], [place], [address] Phone: [phone]   Fax:  [phone]  Physical Therapy Treatment  Patient Details  Name: Linda Mcgrath MRN: [id number] Date of Birth: 17-Jan-1933 Referring Provider: [name]  Encounter Date: 05/19/2017      PT End of Session - 05/19/17 1141    Visit Number 17   Date for PT Re-Evaluation 06/13/17   PT Start Time 1055   PT Stop Time 1144   PT Time Calculation (min) 49 min   Activity Tolerance Patient tolerated treatment well   Behavior During Therapy [facility] for tasks assessed/performed      Past Medical History:  Diagnosis Date  . Allergic rhinitis   . Cataract    Bil/lens implant  . Colon polyp 1999   small polyp  . Diverticulosis   . Fibromyalgia   . Hyperlipidemia   . Hypertension   . Hypothyroidism   . Internal hemorrhoids   . Leg cramps   . Lung nodule    stable LUL 9 mm  . Menopausal syndrome   . Osteoarthritis   . UTI (urinary tract infection)     Past Surgical History:  Procedure Laterality Date  . Abd U/S  11/1998   negative  . Abd U/S  01/2001   gallbladder polyps  . ABDOMINAL HYSTERECTOMY  1975   total-fibroids  . APPENDECTOMY  1975  . BREAST BIOPSY     benign  . BREAST EXCISIONAL BIOPSY Left 1957  . CARDIOVERSION N/A 03/07/2016   Procedure: CARDIOVERSION;  Surgeon: Adrian Prows, MD;  Location: [place];  Service: Cardiovascular;  Laterality: N/A;  . CATARACT EXTRACTION     bilateral  . CHOLECYSTECTOMY    . COLONOSCOPY  1998   Diverticulosis; polyp\  . COLONOSCOPY  09/2002   Diverticulosis, hem  . COLONOSCOPY  12/2007   diverticulosis, polyp  . DEXA  10/2001   osteopenia  . ELECTROPHYSIOLOGIC STUDY N/A 04/03/2016   Procedure: A-Flutter Ablation;  Surgeon: Will Meredith Leeds, MD;  Location: [place] CV LAB;  Service: Cardiovascular;  Laterality: N/A;  . ESOPHAGOGASTRODUODENOSCOPY  2004  . Hida scan  01/2001   Negative   . KNEE SURGERY     right knee / 11/2004  . laser surgery for glaucoma Left Sep 21, 2014  . ROTATOR CUFF REPAIR     x 2 /right shoulder  . SKIN CANCER EXCISION     pre-melanoma / on face  . TEE WITHOUT CARDIOVERSION N/A 03/07/2016   Procedure: TRANSESOPHAGEAL ECHOCARDIOGRAM (TEE);  Surgeon: Adrian Prows, MD;  Location: [facility] ENDOSCOPY;  Service: Cardiovascular;  Laterality: N/A;    There were no vitals filed for this visit.      Subjective Assessment - 05/19/17 1138    Subjective Reports was feeling better, but on Sunday had increaesd pain an 8/10 in the left buttock, she reports still having help with putting on socks   Currently in Pain? Yes   Pain Score 5    Pain Location Hip   Pain Orientation Left                         [facility] Adult PT Treatment/Exercise - 05/19/17 0001      Lumbar Exercises: Stretches   Passive Hamstring Stretch 4 reps;20 seconds   Piriformis Stretch 5 reps;10 seconds     Moist Heat Therapy   Number Minutes Moist Heat 15 Minutes   Moist Heat Location Hip

## 2017-05-21 ENCOUNTER — Ambulatory Visit: Payer: Medicare Other | Admitting: Physical Therapy

## 2017-05-21 ENCOUNTER — Encounter: Payer: Self-pay | Admitting: Physical Therapy

## 2017-05-21 DIAGNOSIS — R262 Difficulty in walking, not elsewhere classified: Secondary | ICD-10-CM | POA: Diagnosis not present

## 2017-05-21 DIAGNOSIS — M5441 Lumbago with sciatica, right side: Secondary | ICD-10-CM | POA: Diagnosis not present

## 2017-05-21 DIAGNOSIS — M62830 Muscle spasm of back: Secondary | ICD-10-CM | POA: Diagnosis not present

## 2017-05-21 NOTE — Therapy (Signed)
[facility] [place] [place] [address] [place], [place], [address] Phone: [phone]   Fax:  [phone]  Physical Therapy Treatment  Patient Details  Name: Linda Mcgrath MRN: [id number] Date of Birth: 1932/10/27 Referring Provider: Milinda Antis  Encounter Date: 05/21/2017      PT End of Session - 05/21/17 1136    Visit Number 18   Date for PT Re-Evaluation 06/13/17   PT Start Time 1055   PT Stop Time 1140   PT Time Calculation (min) 45 min   Activity Tolerance Patient tolerated treatment well   Behavior During Therapy [facility] for tasks assessed/performed      Past Medical History:  Diagnosis Date  . Allergic rhinitis   . Cataract    Bil/lens implant  . Colon polyp 1999   small polyp  . Diverticulosis   . Fibromyalgia   . Hyperlipidemia   . Hypertension   . Hypothyroidism   . Internal hemorrhoids   . Leg cramps   . Lung nodule    stable LUL 9 mm  . Menopausal syndrome   . Osteoarthritis   . UTI (urinary tract infection)     Past Surgical History:  Procedure Laterality Date  . Abd U/S  11/1998   negative  . Abd U/S  01/2001   gallbladder polyps  . ABDOMINAL HYSTERECTOMY  1975   total-fibroids  . APPENDECTOMY  1975  . BREAST BIOPSY     benign  . BREAST EXCISIONAL BIOPSY Left 1957  . CARDIOVERSION N/A 03/07/2016   Procedure: CARDIOVERSION;  Surgeon: Yates Decamp, MD;  Location: [facility] ENDOSCOPY;  Service: Cardiovascular;  Laterality: N/A;  . CATARACT EXTRACTION     bilateral  . CHOLECYSTECTOMY    . COLONOSCOPY  1998   Diverticulosis; polyp\  . COLONOSCOPY  09/2002   Diverticulosis, hem  . COLONOSCOPY  12/2007   diverticulosis, polyp  . DEXA  10/2001   osteopenia  . ELECTROPHYSIOLOGIC STUDY N/A 04/03/2016   Procedure: A-Flutter Ablation;  Surgeon: Will [name], MD;  Location: [facility]AS[facility] CV LAB;  Service: Cardiovascular;  Laterality: N/A;  . ESOPHAGOGASTRODUODENOSCOPY  2004  . Hida scan  01/2001   Negative   . KNEE SURGERY     right knee / 11/2004  . laser surgery for glaucoma Left Sep 21, 2014  . ROTATOR CUFF REPAIR     x 2 /right shoulder  . SKIN CANCER EXCISION     pre-melanoma / on face  . TEE WITHOUT CARDIOVERSION N/A 03/07/2016   Procedure: TRANSESOPHAGEAL ECHOCARDIOGRAM (TEE);  Surgeon: Yates Decamp, MD;  Location: [facility] ENDOSCOPY;  Service: Cardiovascular;  Laterality: N/A;    There were no vitals filed for this visit.      Subjective Assessment - 05/21/17 1124    Subjective Reports that her left thigh was very tight and sore after the last treatment   Currently in Pain? Yes   Pain Score 4    Pain Location Hip   Pain Orientation Left            [facility] PT Assessment - 05/21/17 0001      Strength   Overall Strength Comments 4/5 for the hips                     [facility] Adult PT Treatment/Exercise - 05/21/17 0001      Moist Heat Therapy   Number Minutes Moist Heat 15 Minutes   Moist Heat Location Hip     Electrical  Stimulation   Electrical Stimulation Location left buttock into the lateral hip area   Electrical Stimulation Action IFC   Electrical Stimulation Parameters supine   Electrical Stimulation Goals Pain     Iontophoresis   Type of Iontophoresis Dexamethasone   Location left hip   Dose 80mA   Time 4 hour patch #3     Manual Therapy   Manual Therapy Soft tissue mobilization;Passive ROM   Soft tissue mobilization to the left buttock area   Passive ROM hip flexion, ER/IR and ITB stretches                  PT Short Term Goals - 04/07/17 1053      PT SHORT TERM GOAL #1   Title independent with HEP   Status Achieved           PT Long Term Goals - 05/21/17 1138      PT LONG TERM GOAL #5   Title Independent with advanced HEP.    Status Partially Met               Plan - 05/21/17 1136    Clinical Impression Statement Patient ahs had some ups and downs, increased pain, soreness, a lot of it is when we have tried  exercises and this last time seems to be when we tried to do some STM of the ITB   PT Next Visit Plan continue to adjust the treatment, DDD, vs mms vs weakness   Consulted and Agree with Plan of Care Patient      Patient will benefit from skilled therapeutic intervention in order to improve the following deficits and impairments:  Decreased activity tolerance, Decreased mobility, Decreased endurance, Cardiopulmonary status limiting activity, Decreased range of motion, Decreased strength, Postural dysfunction, Improper body mechanics, Impaired flexibility, Pain, Increased muscle spasms, Difficulty walking  Visit Diagnosis: Muscle spasm of back  Acute right-sided low back pain with right-sided sciatica  Difficulty in walking, not elsewhere classified     Problem List Patient Active Problem List   Diagnosis Date Noted  . ETD (eustachian tube dysfunction) 03/17/2017  . Low back pain with right-sided sciatica 03/04/2017  . Tick bite of calf 01/28/2017  . Abscess and cellulitis of gluteal region 08/07/2016  . Atrial flutter (HCC) 03/06/2016  . Severe headache 03/04/2016  . Obesity 11/06/2015  . Urge incontinence 01/24/2015  . Estrogen deficiency 10/25/2014  . Lichen planus 12/20/2013  . Encounter for Medicare annual wellness exam 06/17/2013  . Aphthous ulcer of mouth 03/16/2013  . Screening mammogram, encounter for 06/15/2012  . Hyperglycemia 06/03/2011  . HYPERTENSION, BENIGN ESSENTIAL 10/23/2010  . CONSTIPATION 10/23/2010  . HEMATOCHEZIA 10/23/2010  . OSTEOARTHRITIS, HANDS, BILATERAL 01/24/2010  . Hypothyroidism 08/24/2008  . Vitamin D deficiency 06/06/2008  . Hyperlipidemia 06/06/2008  . MENOPAUSAL SYNDROME 03/29/2008  . Osteopenia 03/29/2008  . ALLERGIC RHINITIS 01/25/2008  . IBS 01/25/2008  . OSTEOARTHRITIS 01/25/2008  . FIBROMYALGIA 01/25/2008    Jearld Lesch., PT 05/21/2017, 11:39 AM  [facility] [place] [address] [place], [place], [address] Phone: [phone]   Fax:  [phone]  Name: Linda Mcgrath MRN: [id number] Date of Birth: 13-Dec-1932

## 2017-05-26 DIAGNOSIS — Z23 Encounter for immunization: Secondary | ICD-10-CM | POA: Diagnosis not present

## 2017-05-27 ENCOUNTER — Ambulatory Visit: Payer: Medicare Other | Attending: Family Medicine | Admitting: Physical Therapy

## 2017-05-27 ENCOUNTER — Encounter: Payer: Self-pay | Admitting: Physical Therapy

## 2017-05-27 DIAGNOSIS — R262 Difficulty in walking, not elsewhere classified: Secondary | ICD-10-CM | POA: Diagnosis not present

## 2017-05-27 DIAGNOSIS — M62830 Muscle spasm of back: Secondary | ICD-10-CM | POA: Insufficient documentation

## 2017-05-27 DIAGNOSIS — M5441 Lumbago with sciatica, right side: Secondary | ICD-10-CM

## 2017-05-27 NOTE — Therapy (Signed)
[facility] [address] [place], [place], [address] Phone: [phone]   Fax:  [phone]  Physical Therapy Treatment  Patient Details  Name: Linda Mcgrath MRN: [id number] Date of Birth: 1933-08-24 Referring Provider: Milinda Antis  Encounter Date: 05/27/2017      PT End of Session - 05/27/17 1316    Visit Number 19   Date for PT Re-Evaluation 06/13/17   PT Start Time 1252   PT Stop Time 1340   PT Time Calculation (min) 48 min   Activity Tolerance Patient tolerated treatment well   Behavior During Therapy [facility] for tasks assessed/performed      Past Medical History:  Diagnosis Date  . Allergic rhinitis   . Cataract    Bil/lens implant  . Colon polyp 1999   small polyp  . Diverticulosis   . Fibromyalgia   . Hyperlipidemia   . Hypertension   . Hypothyroidism   . Internal hemorrhoids   . Leg cramps   . Lung nodule    stable LUL 9 mm  . Menopausal syndrome   . Osteoarthritis   . UTI (urinary tract infection)     Past Surgical History:  Procedure Laterality Date  . Abd U/S  11/1998   negative  . Abd U/S  01/2001   gallbladder polyps  . ABDOMINAL HYSTERECTOMY  1975   total-fibroids  . APPENDECTOMY  1975  . BREAST BIOPSY     benign  . BREAST EXCISIONAL BIOPSY Left 1957  . CARDIOVERSION N/A 03/07/2016   Procedure: CARDIOVERSION;  Surgeon: Yates Decamp, MD;  Location: [facility] ENDOSCOPY;  Service: Cardiovascular;  Laterality: N/A;  . CATARACT EXTRACTION     bilateral  . CHOLECYSTECTOMY    . COLONOSCOPY  1998   Diverticulosis; polyp\  . COLONOSCOPY  09/2002   Diverticulosis, hem  . COLONOSCOPY  12/2007   diverticulosis, polyp  . DEXA  10/2001   osteopenia  . ELECTROPHYSIOLOGIC STUDY N/A 04/03/2016   Procedure: A-Flutter Ablation;  Surgeon: Will [name], MD;  [facility] INVASIVE CV LAB;  Service: Cardiovascular;  Laterality: N/A;  . ESOPHAGOGASTRODUODENOSCOPY  2004  . Hida scan  01/2001   Negative   . KNEE SURGERY     right knee / 11/2004  . laser surgery for glaucoma Left Sep 21, 2014  . ROTATOR CUFF REPAIR     x 2 /right shoulder  . SKIN CANCER EXCISION     pre-melanoma / on face  . TEE WITHOUT CARDIOVERSION N/A 03/07/2016   Procedure: TRANSESOPHAGEAL ECHOCARDIOGRAM (TEE);  Surgeon: Yates Decamp, MD;  Location: [facility] ENDOSCOPY;  Service: Cardiovascular;  Laterality: N/A;    There were no vitals filed for this visit.      Subjective Assessment - 05/27/17 1255    Subjective Reports that she is feeling better since the last treatment, " I think we should continue that, I was able to walk better most of the weekend"   Currently in Pain? Yes   Pain Score 3    Pain Location Hip   Pain Orientation Left;Lateral   Aggravating Factors  putting socks on, walking   Pain Relieving Factors "the last treatment"                         [facility] Adult PT Treatment/Exercise - 05/27/17 0001      Lumbar Exercises: Stretches   Passive Hamstring Stretch 4 reps;20 seconds   Single Knee to Chest Stretch 3 reps;10  seconds   Lower Trunk Rotation 3 reps;10 seconds   Piriformis Stretch 5 reps;10 seconds     Moist Heat Therapy   Number Minutes Moist Heat 15 Minutes   Moist Heat Location Hip     Electrical Stimulation   Electrical Stimulation Location left buttock into the lateral hip area   Electrical Stimulation Action IFC   Electrical Stimulation Parameters supine   Electrical Stimulation Goals Pain     Iontophoresis   Type of Iontophoresis Dexamethasone   Location left hip   Dose 80mA   Time 4 hour patch #4     Manual Therapy   Manual Therapy Soft tissue mobilization;Passive ROM   Manual therapy comments gentle PROM stretch for HS and piriformis                  PT Short Term Goals - 04/07/17 1053      PT SHORT TERM GOAL #1   Title independent with HEP   Status Achieved           PT Long Term Goals - 05/21/17 1138      PT LONG TERM GOAL #5   Title  Independent with advanced HEP.    Status Partially Met               Plan - 05/27/17 1319    Clinical Impression Statement Reports that she is feeling better, less pain, walking better, it seems like the instability of the lumbar spine is influencing the pain, and she does not tolerate much exercise.   PT Next Visit Plan try to add some gentle stability exercises   Consulted and Agree with Plan of Care Patient      Patient will benefit from skilled therapeutic intervention in order to improve the following deficits and impairments:  Decreased activity tolerance, Decreased mobility, Decreased endurance, Cardiopulmonary status limiting activity, Decreased range of motion, Decreased strength, Postural dysfunction, Improper body mechanics, Impaired flexibility, Pain, Increased muscle spasms, Difficulty walking  Visit Diagnosis: Muscle spasm of back  Acute right-sided low back pain with right-sided sciatica  Difficulty in walking, not elsewhere classified     Problem List Patient Active Problem List   Diagnosis Date Noted  . ETD (eustachian tube dysfunction) 03/17/2017  . Low back pain with right-sided sciatica 03/04/2017  . Tick bite of calf 01/28/2017  . Abscess and cellulitis of gluteal region 08/07/2016  . Atrial flutter (HCC) 03/06/2016  . Severe headache 03/04/2016  . Obesity 11/06/2015  . Urge incontinence 01/24/2015  . Estrogen deficiency 10/25/2014  . Lichen planus 12/20/2013  . Encounter for Medicare annual wellness exam 06/17/2013  . Aphthous ulcer of mouth 03/16/2013  . Screening mammogram, encounter for 06/15/2012  . Hyperglycemia 06/03/2011  . HYPERTENSION, BENIGN ESSENTIAL 10/23/2010  . CONSTIPATION 10/23/2010  . HEMATOCHEZIA 10/23/2010  . OSTEOARTHRITIS, HANDS, BILATERAL 01/24/2010  . Hypothyroidism 08/24/2008  . Vitamin D deficiency 06/06/2008  . Hyperlipidemia 06/06/2008  . MENOPAUSAL SYNDROME 03/29/2008  . Osteopenia 03/29/2008  . ALLERGIC  RHINITIS 01/25/2008  . IBS 01/25/2008  . OSTEOARTHRITIS 01/25/2008  . FIBROMYALGIA 01/25/2008    Jearld Lesch., PT 05/27/2017, 1:21 PM  [facility] [address] [place], [place], [address] Phone: [phone]   Fax:  [phone]  Name: Linda Mcgrath MRN: [id number] Date of Birth: 29-Mar-1933

## 2017-05-30 ENCOUNTER — Ambulatory Visit: Payer: Medicare Other | Admitting: Physical Therapy

## 2017-05-30 ENCOUNTER — Encounter: Payer: Self-pay | Admitting: Physical Therapy

## 2017-05-30 DIAGNOSIS — M62830 Muscle spasm of back: Secondary | ICD-10-CM | POA: Diagnosis not present

## 2017-05-30 DIAGNOSIS — M5441 Lumbago with sciatica, right side: Secondary | ICD-10-CM | POA: Diagnosis not present

## 2017-05-30 DIAGNOSIS — R262 Difficulty in walking, not elsewhere classified: Secondary | ICD-10-CM

## 2017-05-30 NOTE — Therapy (Signed)
[place], [place], [address] Phone: [phone]   Fax:  [phone]  Physical Therapy Treatment  Patient Details  Name: Linda Mcgrath MRN: [id number] Date of Birth: 11-30-1932 Referring Provider: [name]  Encounter Date: 05/30/2017      PT End of Session - 05/30/17 1009    Visit Number 20   Date for PT Re-Evaluation 06/13/17   PT Start Time 0922   PT Stop Time 1020   PT Time Calculation (min) 58 min   Activity Tolerance Patient tolerated treatment well   Behavior During Therapy [facility] for tasks assessed/performed      Past Medical History:  Diagnosis Date  . Allergic rhinitis   . Cataract    Bil/lens implant  . Colon polyp 1999   small polyp  . Diverticulosis   . Fibromyalgia   . Hyperlipidemia   . Hypertension   . Hypothyroidism   . Internal hemorrhoids   . Leg cramps   . Lung nodule    stable LUL 9 mm  . Menopausal syndrome   . Osteoarthritis   . UTI (urinary tract infection)     Past Surgical History:  Procedure Laterality Date  . Abd U/S  11/1998   negative  . Abd U/S  01/2001   gallbladder polyps  . ABDOMINAL HYSTERECTOMY  1975   total-fibroids  . APPENDECTOMY  1975  . BREAST BIOPSY     benign  . BREAST EXCISIONAL BIOPSY Left 1957  . CARDIOVERSION N/A 03/07/2016   Procedure: CARDIOVERSION;  Surgeon: Adrian Prows, MD;  Location: [facility];  Service: Cardiovascular;  Laterality: N/A;  . CATARACT EXTRACTION     bilateral  . CHOLECYSTECTOMY    . COLONOSCOPY  1998   Diverticulosis; polyp\  . COLONOSCOPY  09/2002   Diverticulosis, hem  . COLONOSCOPY  12/2007   diverticulosis, polyp  . DEXA  10/2001   osteopenia  . ELECTROPHYSIOLOGIC STUDY N/A 04/03/2016   Procedure: A-Flutter Ablation;  Surgeon: Will Meredith Leeds, MD;  Location: [facility] CV LAB;  Service: Cardiovascular;  Laterality: N/A;  . ESOPHAGOGASTRODUODENOSCOPY  2004  . Hida scan  01/2001   Negative   . KNEE SURGERY     right knee / 11/2004  . laser surgery for glaucoma Left Sep 21, 2014  . ROTATOR CUFF REPAIR     x 2 /right shoulder  . SKIN CANCER EXCISION     pre-melanoma / on face  . TEE WITHOUT CARDIOVERSION N/A 03/07/2016   Procedure: TRANSESOPHAGEAL ECHOCARDIOGRAM (TEE);  Surgeon: Adrian Prows, MD;  Location: [facility] ENDOSCOPY;  Service: Cardiovascular;  Laterality: N/A;    There were no vitals filed for this visit.      Subjective Assessment - 05/30/17 1004    Subjective No changes reports that she is having some increased pain, some difficulty with steps and getting socks on   Currently in Pain? Yes   Pain Score 5    Pain Location Hip   Pain Orientation Left                         [facility] Adult PT Treatment/Exercise - 05/30/17 0001      Lumbar Exercises: Standing   Other Standing Lumbar Exercises Hip ext & abd x10 , green tband hip push down   Other Standing Lumbar Exercises Straight arm pull downs 10# 2x15     Lumbar Exercises: Supine   Other Supine Lumbar Exercises feet  Hyperlipidemia 06/06/2008  . MENOPAUSAL SYNDROME 03/29/2008  . Osteopenia 03/29/2008  . ALLERGIC RHINITIS 01/25/2008  . IBS 01/25/2008  . OSTEOARTHRITIS 01/25/2008  . FIBROMYALGIA 01/25/2008    [name], PT 05/30/2017, 10:24 AM  [facility] [address] [place], [place], [address] Phone: [phone]   Fax:  [phone]  Name: Linda Mcgrath MRN: [id number] Date of Birth: 1933-08-05  [place], [place], [address] Phone: [phone]   Fax:  [phone]  Physical Therapy Treatment  Patient Details  Name: Linda Mcgrath MRN: [id number] Date of Birth: 11-30-1932 Referring Provider: [name]  Encounter Date: 05/30/2017      PT End of Session - 05/30/17 1009    Visit Number 20   Date for PT Re-Evaluation 06/13/17   PT Start Time 0922   PT Stop Time 1020   PT Time Calculation (min) 58 min   Activity Tolerance Patient tolerated treatment well   Behavior During Therapy [facility] for tasks assessed/performed      Past Medical History:  Diagnosis Date  . Allergic rhinitis   . Cataract    Bil/lens implant  . Colon polyp 1999   small polyp  . Diverticulosis   . Fibromyalgia   . Hyperlipidemia   . Hypertension   . Hypothyroidism   . Internal hemorrhoids   . Leg cramps   . Lung nodule    stable LUL 9 mm  . Menopausal syndrome   . Osteoarthritis   . UTI (urinary tract infection)     Past Surgical History:  Procedure Laterality Date  . Abd U/S  11/1998   negative  . Abd U/S  01/2001   gallbladder polyps  . ABDOMINAL HYSTERECTOMY  1975   total-fibroids  . APPENDECTOMY  1975  . BREAST BIOPSY     benign  . BREAST EXCISIONAL BIOPSY Left 1957  . CARDIOVERSION N/A 03/07/2016   Procedure: CARDIOVERSION;  Surgeon: Adrian Prows, MD;  Location: [facility];  Service: Cardiovascular;  Laterality: N/A;  . CATARACT EXTRACTION     bilateral  . CHOLECYSTECTOMY    . COLONOSCOPY  1998   Diverticulosis; polyp\  . COLONOSCOPY  09/2002   Diverticulosis, hem  . COLONOSCOPY  12/2007   diverticulosis, polyp  . DEXA  10/2001   osteopenia  . ELECTROPHYSIOLOGIC STUDY N/A 04/03/2016   Procedure: A-Flutter Ablation;  Surgeon: Will Meredith Leeds, MD;  Location: [facility] CV LAB;  Service: Cardiovascular;  Laterality: N/A;  . ESOPHAGOGASTRODUODENOSCOPY  2004  . Hida scan  01/2001   Negative   . KNEE SURGERY     right knee / 11/2004  . laser surgery for glaucoma Left Sep 21, 2014  . ROTATOR CUFF REPAIR     x 2 /right shoulder  . SKIN CANCER EXCISION     pre-melanoma / on face  . TEE WITHOUT CARDIOVERSION N/A 03/07/2016   Procedure: TRANSESOPHAGEAL ECHOCARDIOGRAM (TEE);  Surgeon: Adrian Prows, MD;  Location: [facility] ENDOSCOPY;  Service: Cardiovascular;  Laterality: N/A;    There were no vitals filed for this visit.      Subjective Assessment - 05/30/17 1004    Subjective No changes reports that she is having some increased pain, some difficulty with steps and getting socks on   Currently in Pain? Yes   Pain Score 5    Pain Location Hip   Pain Orientation Left                         [facility] Adult PT Treatment/Exercise - 05/30/17 0001      Lumbar Exercises: Standing   Other Standing Lumbar Exercises Hip ext & abd x10 , green tband hip push down   Other Standing Lumbar Exercises Straight arm pull downs 10# 2x15     Lumbar Exercises: Supine   Other Supine Lumbar Exercises feet

## 2017-06-03 ENCOUNTER — Encounter: Payer: Self-pay | Admitting: Physical Therapy

## 2017-06-03 ENCOUNTER — Ambulatory Visit: Payer: Medicare Other | Admitting: Physical Therapy

## 2017-06-03 DIAGNOSIS — M62830 Muscle spasm of back: Secondary | ICD-10-CM

## 2017-06-03 DIAGNOSIS — M5441 Lumbago with sciatica, right side: Secondary | ICD-10-CM

## 2017-06-03 DIAGNOSIS — R262 Difficulty in walking, not elsewhere classified: Secondary | ICD-10-CM

## 2017-06-03 NOTE — Therapy (Signed)
[facility] [place], [place], [address] Phone: [phone]   Fax:  [phone]  Physical Therapy Treatment  Patient Details  Name: Linda Mcgrath MRN: [id number] Date of Birth: 1932-11-09 Referring Provider: [name]  Encounter Date: 06/03/2017      PT End of Session - 06/03/17 1329    Visit Number 21   Date for PT Re-Evaluation 06/13/17   PT Start Time 1300   PT Stop Time 1349   PT Time Calculation (min) 49 min   Activity Tolerance Patient tolerated treatment well   Behavior During Therapy [facility] for tasks assessed/performed      Past Medical History:  Diagnosis Date  . Allergic rhinitis   . Cataract    Bil/lens implant  . Colon polyp 1999   small polyp  . Diverticulosis   . Fibromyalgia   . Hyperlipidemia   . Hypertension   . Hypothyroidism   . Internal hemorrhoids   . Leg cramps   . Lung nodule    stable LUL 9 mm  . Menopausal syndrome   . Osteoarthritis   . UTI (urinary tract infection)     Past Surgical History:  Procedure Laterality Date  . Abd U/S  11/1998   negative  . Abd U/S  01/2001   gallbladder polyps  . ABDOMINAL HYSTERECTOMY  1975   total-fibroids  . APPENDECTOMY  1975  . BREAST BIOPSY     benign  . BREAST EXCISIONAL BIOPSY Left 1957  . CARDIOVERSION N/A 03/07/2016   Procedure: CARDIOVERSION;  Surgeon: Adrian Prows, MD;  Location: [facility];  Service: Cardiovascular;  Laterality: N/A;  . CATARACT EXTRACTION     bilateral  . CHOLECYSTECTOMY    . COLONOSCOPY  1998   Diverticulosis; polyp\  . COLONOSCOPY  09/2002   Diverticulosis, hem  . COLONOSCOPY  12/2007   diverticulosis, polyp  . DEXA  10/2001   osteopenia  . ELECTROPHYSIOLOGIC STUDY N/A 04/03/2016   Procedure: A-Flutter Ablation;  Surgeon: Will Meredith Leeds, MD;  Location: [place] CV LAB;  Service: Cardiovascular;  Laterality: N/A;  . ESOPHAGOGASTRODUODENOSCOPY  2004  . Hida scan  01/2001   Negative   . KNEE SURGERY     right knee / 11/2004  . laser surgery for glaucoma Left Sep 21, 2014  . ROTATOR CUFF REPAIR     x 2 /right shoulder  . SKIN CANCER EXCISION     pre-melanoma / on face  . TEE WITHOUT CARDIOVERSION N/A 03/07/2016   Procedure: TRANSESOPHAGEAL ECHOCARDIOGRAM (TEE);  Surgeon: Adrian Prows, MD;  Location: [facility] ENDOSCOPY;  Service: Cardiovascular;  Laterality: N/A;    There were no vitals filed for this visit.      Subjective Assessment - 06/03/17 1306    Subjective Patient reports that her lateral hip is feeling better, reports that the low back is hurting more this week.   Currently in Pain? Yes   Pain Score 6    Pain Orientation Lower   Pain Descriptors / Indicators Aching   Aggravating Factors  walking and standing                         [facility] Adult PT Treatment/Exercise - 06/03/17 0001      Lumbar Exercises: Seated   Other Seated Lumbar Exercises Seated crunches with Red ball. x10, Seated march 2x10 , black tband extension   Other Seated Lumbar Exercises HS curls red 2x10, red tband scapular  Aphthous ulcer of mouth 03/16/2013  . Screening mammogram, encounter for 06/15/2012  . Hyperglycemia 06/03/2011  . HYPERTENSION, BENIGN ESSENTIAL 10/23/2010  . CONSTIPATION 10/23/2010  . HEMATOCHEZIA 10/23/2010  . OSTEOARTHRITIS, HANDS, BILATERAL 01/24/2010  . Hypothyroidism 08/24/2008  . Vitamin D deficiency 06/06/2008  . Hyperlipidemia 06/06/2008  . MENOPAUSAL SYNDROME 03/29/2008  . Osteopenia 03/29/2008  . ALLERGIC RHINITIS 01/25/2008  . IBS 01/25/2008  . OSTEOARTHRITIS 01/25/2008  . FIBROMYALGIA 01/25/2008    [name], PT 06/03/2017, 1:54 PM  [place] [address] [place], [place], [address] Phone: [phone]   Fax:  [phone]  Name: Linda Mcgrath MRN: [id number] Date of Birth: [birth year]  Aphthous ulcer of mouth 03/16/2013  . Screening mammogram, encounter for 06/15/2012  . Hyperglycemia 06/03/2011  . HYPERTENSION, BENIGN ESSENTIAL 10/23/2010  . CONSTIPATION 10/23/2010  . HEMATOCHEZIA 10/23/2010  . OSTEOARTHRITIS, HANDS, BILATERAL 01/24/2010  . Hypothyroidism 08/24/2008  . Vitamin D deficiency 06/06/2008  . Hyperlipidemia 06/06/2008  . MENOPAUSAL SYNDROME 03/29/2008  . Osteopenia 03/29/2008  . ALLERGIC RHINITIS 01/25/2008  . IBS 01/25/2008  . OSTEOARTHRITIS 01/25/2008  . FIBROMYALGIA 01/25/2008    [name], PT 06/03/2017, 1:54 PM  [place] [address] [place], [place], [address] Phone: [phone]   Fax:  [phone]  Name: Linda Mcgrath MRN: [id number] Date of Birth: [birth year]

## 2017-06-17 DIAGNOSIS — M545 Low back pain: Secondary | ICD-10-CM | POA: Diagnosis not present

## 2017-06-17 DIAGNOSIS — Z6830 Body mass index (BMI) 30.0-30.9, adult: Secondary | ICD-10-CM | POA: Diagnosis not present

## 2017-06-17 DIAGNOSIS — R030 Elevated blood-pressure reading, without diagnosis of hypertension: Secondary | ICD-10-CM | POA: Diagnosis not present

## 2017-06-17 DIAGNOSIS — M4317 Spondylolisthesis, lumbosacral region: Secondary | ICD-10-CM | POA: Diagnosis not present

## 2017-07-02 DIAGNOSIS — M545 Low back pain: Secondary | ICD-10-CM | POA: Diagnosis not present

## 2017-07-07 ENCOUNTER — Other Ambulatory Visit: Payer: Self-pay | Admitting: Family Medicine

## 2017-07-07 NOTE — Telephone Encounter (Signed)
Pt had CPE on 11/15/16 and has since had multiple acute appts., last filled on 03/04/17 #30 tabs with 3 additional refills, please advise

## 2017-07-07 NOTE — Telephone Encounter (Signed)
Will refill electronically  

## 2017-07-08 ENCOUNTER — Other Ambulatory Visit: Payer: Self-pay | Admitting: Neurological Surgery

## 2017-07-08 DIAGNOSIS — M4317 Spondylolisthesis, lumbosacral region: Secondary | ICD-10-CM | POA: Diagnosis not present

## 2017-07-08 DIAGNOSIS — R030 Elevated blood-pressure reading, without diagnosis of hypertension: Secondary | ICD-10-CM | POA: Diagnosis not present

## 2017-07-08 DIAGNOSIS — Z6831 Body mass index (BMI) 31.0-31.9, adult: Secondary | ICD-10-CM | POA: Diagnosis not present

## 2017-07-11 ENCOUNTER — Telehealth: Payer: Self-pay | Admitting: Family Medicine

## 2017-07-11 NOTE — Telephone Encounter (Signed)
Copied from Nina [id number]. Topic: General - Other >> Jul 11, 2017 12:55 PM Aurelio Brash B wrote: Reason for CRM: Pt called to let Dr [name] know she is having back surgery on Dec 6 with Dr Sherley Bounds  I am aware-thanks

## 2017-07-25 ENCOUNTER — Ambulatory Visit (HOSPITAL_COMMUNITY)
Admission: RE | Admit: 2017-07-25 | Discharge: 2017-07-25 | Disposition: A | Discharge status: Home / Self Care | Payer: Medicare Other | Source: Ambulatory Visit | Attending: Neurological Surgery | Admitting: Neurological Surgery

## 2017-07-25 ENCOUNTER — Encounter (HOSPITAL_COMMUNITY): Payer: Self-pay

## 2017-07-25 ENCOUNTER — Other Ambulatory Visit: Payer: Self-pay

## 2017-07-25 ENCOUNTER — Encounter (HOSPITAL_COMMUNITY)
Admission: RE | Admit: 2017-07-25 | Discharge: 2017-07-25 | Disposition: A | Discharge status: Home / Self Care | Payer: Medicare Other | Source: Ambulatory Visit | Attending: Neurological Surgery | Admitting: Neurological Surgery

## 2017-07-25 DIAGNOSIS — Z01818 Encounter for other preprocedural examination: Secondary | ICD-10-CM | POA: Insufficient documentation

## 2017-07-25 DIAGNOSIS — Z01810 Encounter for preprocedural cardiovascular examination: Secondary | ICD-10-CM | POA: Diagnosis not present

## 2017-07-25 DIAGNOSIS — M4310 Spondylolisthesis, site unspecified: Secondary | ICD-10-CM | POA: Diagnosis not present

## 2017-07-25 DIAGNOSIS — Z01812 Encounter for preprocedural laboratory examination: Secondary | ICD-10-CM | POA: Diagnosis not present

## 2017-07-25 HISTORY — DX: Cardiac arrhythmia, unspecified: I49.9

## 2017-07-25 HISTORY — DX: Unspecified asthma, uncomplicated: J45.909

## 2017-07-25 LAB — BASIC METABOLIC PANEL
Anion gap: 8 (ref 5–15)
BUN: 16 mg/dL (ref 6–20)
CO2: 25 mmol/L (ref 22–32)
Calcium: 9.3 mg/dL (ref 8.9–10.3)
Chloride: 105 mmol/L (ref 101–111)
Creatinine, Ser: 1.06 mg/dL — ABNORMAL HIGH (ref 0.44–1.00)
GFR calc Af Amer: 55 mL/min — ABNORMAL LOW (ref >60)
GFR calc non Af Amer: 48 mL/min — ABNORMAL LOW (ref >60)
Glucose, Bld: 76 mg/dL (ref 65–99)
Potassium: 4.1 mmol/L (ref 3.5–5.1)
Sodium: 138 mmol/L (ref 135–145)

## 2017-07-25 LAB — CBC WITH DIFFERENTIAL/PLATELET
Basophils Absolute: 0 10*3/uL (ref 0.0–0.1)
Basophils Relative: 1 %
Eosinophils Absolute: 0.2 10*3/uL (ref 0.0–0.7)
Eosinophils Relative: 3 %
HCT: 45.8 % (ref 36.0–46.0)
Hemoglobin: 15.7 g/dL — ABNORMAL HIGH (ref 12.0–15.0)
Lymphocytes Relative: 43 %
Lymphs Abs: 3.7 10*3/uL (ref 0.7–4.0)
MCH: 32.2 pg (ref 26.0–34.0)
MCHC: 34.3 g/dL (ref 30.0–36.0)
MCV: 93.9 fL (ref 78.0–100.0)
Monocytes Absolute: 0.8 10*3/uL (ref 0.1–1.0)
Monocytes Relative: 9 %
Neutro Abs: 3.9 10*3/uL (ref 1.7–7.7)
Neutrophils Relative %: 44 %
Platelets: 240 10*3/uL (ref 150–400)
RBC: 4.88 MIL/uL (ref 3.87–5.11)
RDW: 12.7 % (ref 11.5–15.5)
WBC: 8.7 10*3/uL (ref 4.0–10.5)

## 2017-07-25 LAB — SURGICAL PCR SCREEN
MRSA, PCR: NEGATIVE
Staphylococcus aureus: NEGATIVE

## 2017-07-25 LAB — PROTIME-INR
INR: 1.04
Prothrombin Time: 13.5 seconds (ref 11.4–15.2)

## 2017-07-25 LAB — TYPE AND SCREEN
ABO/RH(D): B NEG
Antibody Screen: NEGATIVE

## 2017-07-25 LAB — ABO/RH: ABO/RH(D): B NEG

## 2017-07-25 NOTE — Progress Notes (Signed)
PATIENT STATES HER LAST DOSE OF ASPIRIN WAS 07-24-17.

## 2017-07-25 NOTE — Pre-Procedure Instructions (Signed)
Linda Mcgrath  07/25/2017      [id number] [place], [name] - [address] [place]  [address] Phone: [phone] Fax: [phone]  [facility] [place] [place] [address] [place] [address] [place] [address] Phone: [phone] Fax: [phone]    Your procedure is scheduled on Thursday, December 6.  Report to [facility] Admitting at 10:30 AM                  Your surgery or procedure is scheduled for 12:30 PM                   If you have any questions the day of surgery call :[phone]- pre- op desk.      Remember:  Do not eat food or drink liquids after midnight Wednesday, December 5.  Take these medicines the morning of surgery with A SIP OF WATER: estradiol (ESTRACE)  levothyroxine (SYNTHROID, LEVOTHROID).                 May take/use if needed: loratadine (CLARITIN)  fluticasone (FLONASE)                  1 Week prior to surgery STOP taking Aspirin, Aspirin Products (Goody Powder, Excedrin Migraine), Ibuprofen (Advil), Naproxen (Aleve), Vitamins and Herbal Products (ie Fish Oil)  Special instructions:  [place]- Preparing For Surgery  Before surgery, you can play an important role. Because skin is not sterile, your skin needs to be as free of germs as possible. You can reduce the number of germs on your skin by washing with CHG (chlorahexidine gluconate) Soap before surgery.  CHG is an antiseptic cleaner which kills germs and bonds with the skin to continue killing germs even after washing.  Please do not use if you have an allergy to CHG or antibacterial soaps. If your skin becomes reddened/irritated stop using the CHG.  Do not shave (including legs and underarms) for at least 48 hours prior to first CHG shower. It is OK to shave your face.  Please follow these instructions carefully.   1. Shower the NIGHT BEFORE SURGERY and the MORNING OF SURGERY with CHG.   2. If you  chose to wash your hair, wash your hair first as usual with your normal shampoo.  3. After you shampoo, rinse your hair and body thoroughly to remove the shampoo.     Wash your face and private area with the soap you use at home, then rinse.  4. Use CHG as you would any other liquid soap. You can apply CHG directly to the skin and wash gently with a scrungie or a clean washcloth.   5. Apply the CHG Soap to your body ONLY FROM THE NECK DOWN.  Do not use on open wounds or open sores. Avoid contact with your eyes, ears, mouth and genitals (private parts). Wash Face and genitals (private parts)  with your normal soap.  6. Wash thoroughly, paying special attention to the area where your surgery will be performed.  7. Thoroughly rinse your body with warm water from the neck down.  8. DO NOT shower/wash with your normal soap after using and rinsing off the CHG Soap.  9. Pat yourself dry with a CLEAN TOWEL.  10. Wear CLEAN PAJAMAS to bed the night before surgery, wear comfortable clothes the morning of surgery  11. Place CLEAN SHEETS on your bed the night of your first shower  and DO NOT SLEEP WITH PETS.  Day of Surgery: shower as above. Do not apply any deodorants/lotions, powders or colognes.. Please wear clean clothes to the hospital/surgery center.    Do not wear jewelry, make-up or nail polish.  Do not shave 48 hours prior to surgery.  Men may shave face and neck.  Do not bring valuables to the hospital.  [facility] is not responsible for any belongings or valuables.  Contacts, dentures or bridgework may not be worn into surgery.  Leave your suitcase in the car.  After surgery it may be brought to your room.  For patients admitted to the hospital, discharge time will be determined by your treatment team.  Patients discharged the day of surgery will not be allowed to drive home.   Please read over the following fact sheets that you were given.

## 2017-07-28 NOTE — Progress Notes (Signed)
Anesthesia Chart Review:  Pt is an 83 year old female scheduled for L4-5, L5-S1 posterior lateral fusion, decompressive laminectomy, and segmental fixation on 07/31/2017 with Sherley Bounds, M.D.  - PCP is Loura Pardon, M.D. who is aware of upcoming surgery.  - Used to see Allegra Lai, MD with EP cardiology for aflutter. Last office visit 05/01/16; f/u prn recommended.   PMH includes: Atrial flutter (s/p ablation 04/03/16), hyperlipidemia, asthma, hypothyroidism. Never smoker. BMI 31.  Medications include: ASA 81 mg, levothyroxine. Last dose ASA 07/24/17.  BP 115/72   Pulse 79   Temp 36.6 C   Resp 20   Ht 5\' 4"  (1.626 m)   Wt 180 lb 3.2 oz (81.7 kg)   LMP 08/26/1969   SpO2 98%   BMI 30.93 kg/m    Preoperative labs reviewed.    CXR 07/25/17: No active cardiopulmonary disease.  EKG 07/25/17: Sinus rhythm with marked sinus arrhythmia , PACs. Nonspecific T wave abnormality  TEE 03/07/16:  - Left ventricle: Systolic function was normal. Wall motion was normal; there were no regional wall motion abnormalities. - Left atrium: No evidence of thrombus in the atrial cavity or appendage. No evidence of thrombus in the atrial cavity or appendage. No evidence of thrombus in the appendage. - Right atrium: No evidence of thrombus in the atrial cavity or appendage. - Impressions: Normal pulmonary artery pressure.  If no changes, I anticipate pt can proceed with surgery as scheduled.   Willeen Cass, FNP-BC [facility]/Anesthesiology Phone: [phone] 07/28/2017 12:59 PM

## 2017-07-30 MED ORDER — VANCOMYCIN HCL 10 G IV SOLR
1250.0000 mg | INTRAVENOUS | Status: DC
  Filled 2017-07-30: qty 1250

## 2017-07-31 ENCOUNTER — Encounter (HOSPITAL_COMMUNITY)
Admission: RE | Disposition: A | Discharge status: Home Health | Payer: Self-pay | Source: Ambulatory Visit | Attending: Neurological Surgery

## 2017-07-31 ENCOUNTER — Inpatient Hospital Stay (HOSPITAL_COMMUNITY): Payer: Medicare Other

## 2017-07-31 ENCOUNTER — Inpatient Hospital Stay (HOSPITAL_COMMUNITY): Payer: Medicare Other | Admitting: Certified Registered"

## 2017-07-31 ENCOUNTER — Encounter (HOSPITAL_COMMUNITY): Payer: Self-pay | Admitting: Certified Registered"

## 2017-07-31 ENCOUNTER — Inpatient Hospital Stay (HOSPITAL_COMMUNITY)
Admission: RE | Admit: 2017-07-31 | Discharge: 2017-08-05 | DRG: 460 | Disposition: A | Discharge status: Home Health | Payer: Medicare Other | Source: Ambulatory Visit | Attending: Neurological Surgery | Admitting: Neurological Surgery

## 2017-07-31 ENCOUNTER — Other Ambulatory Visit: Payer: Self-pay

## 2017-07-31 ENCOUNTER — Inpatient Hospital Stay (HOSPITAL_COMMUNITY): Payer: Medicare Other | Admitting: Emergency Medicine

## 2017-07-31 DIAGNOSIS — Z91048 Other nonmedicinal substance allergy status: Secondary | ICD-10-CM

## 2017-07-31 DIAGNOSIS — Z880 Allergy status to penicillin: Secondary | ICD-10-CM | POA: Diagnosis not present

## 2017-07-31 DIAGNOSIS — Z8249 Family history of ischemic heart disease and other diseases of the circulatory system: Secondary | ICD-10-CM | POA: Diagnosis not present

## 2017-07-31 DIAGNOSIS — Z419 Encounter for procedure for purposes other than remedying health state, unspecified: Secondary | ICD-10-CM

## 2017-07-31 DIAGNOSIS — Z86010 Personal history of colonic polyps: Secondary | ICD-10-CM

## 2017-07-31 DIAGNOSIS — Z882 Allergy status to sulfonamides status: Secondary | ICD-10-CM

## 2017-07-31 DIAGNOSIS — Z7982 Long term (current) use of aspirin: Secondary | ICD-10-CM

## 2017-07-31 DIAGNOSIS — Z881 Allergy status to other antibiotic agents status: Secondary | ICD-10-CM

## 2017-07-31 DIAGNOSIS — Z91013 Allergy to seafood: Secondary | ICD-10-CM

## 2017-07-31 DIAGNOSIS — E039 Hypothyroidism, unspecified: Secondary | ICD-10-CM | POA: Diagnosis present

## 2017-07-31 DIAGNOSIS — Z85820 Personal history of malignant melanoma of skin: Secondary | ICD-10-CM | POA: Diagnosis not present

## 2017-07-31 DIAGNOSIS — Z9841 Cataract extraction status, right eye: Secondary | ICD-10-CM

## 2017-07-31 DIAGNOSIS — G9741 Accidental puncture or laceration of dura during a procedure: Secondary | ICD-10-CM | POA: Diagnosis not present

## 2017-07-31 DIAGNOSIS — Z87892 Personal history of anaphylaxis: Secondary | ICD-10-CM

## 2017-07-31 DIAGNOSIS — I10 Essential (primary) hypertension: Secondary | ICD-10-CM | POA: Diagnosis present

## 2017-07-31 DIAGNOSIS — Z9842 Cataract extraction status, left eye: Secondary | ICD-10-CM

## 2017-07-31 DIAGNOSIS — Z791 Long term (current) use of non-steroidal anti-inflammatories (NSAID): Secondary | ICD-10-CM

## 2017-07-31 DIAGNOSIS — M4320 Fusion of spine, site unspecified: Secondary | ICD-10-CM | POA: Diagnosis not present

## 2017-07-31 DIAGNOSIS — G8911 Acute pain due to trauma: Secondary | ICD-10-CM | POA: Diagnosis not present

## 2017-07-31 DIAGNOSIS — M4807 Spinal stenosis, lumbosacral region: Secondary | ICD-10-CM | POA: Diagnosis present

## 2017-07-31 DIAGNOSIS — Z885 Allergy status to narcotic agent status: Secondary | ICD-10-CM

## 2017-07-31 DIAGNOSIS — Z888 Allergy status to other drugs, medicaments and biological substances status: Secondary | ICD-10-CM

## 2017-07-31 DIAGNOSIS — Z800 Family history of malignant neoplasm of digestive organs: Secondary | ICD-10-CM

## 2017-07-31 DIAGNOSIS — Z981 Arthrodesis status: Secondary | ICD-10-CM | POA: Diagnosis not present

## 2017-07-31 DIAGNOSIS — R911 Solitary pulmonary nodule: Secondary | ICD-10-CM | POA: Diagnosis present

## 2017-07-31 DIAGNOSIS — M797 Fibromyalgia: Secondary | ICD-10-CM | POA: Diagnosis present

## 2017-07-31 DIAGNOSIS — E559 Vitamin D deficiency, unspecified: Secondary | ICD-10-CM | POA: Diagnosis not present

## 2017-07-31 DIAGNOSIS — M48061 Spinal stenosis, lumbar region without neurogenic claudication: Secondary | ICD-10-CM | POA: Diagnosis present

## 2017-07-31 DIAGNOSIS — M4317 Spondylolisthesis, lumbosacral region: Secondary | ICD-10-CM | POA: Diagnosis present

## 2017-07-31 DIAGNOSIS — Z961 Presence of intraocular lens: Secondary | ICD-10-CM | POA: Diagnosis present

## 2017-07-31 DIAGNOSIS — Z90710 Acquired absence of both cervix and uterus: Secondary | ICD-10-CM | POA: Diagnosis not present

## 2017-07-31 DIAGNOSIS — M549 Dorsalgia, unspecified: Secondary | ICD-10-CM | POA: Diagnosis not present

## 2017-07-31 DIAGNOSIS — M4316 Spondylolisthesis, lumbar region: Secondary | ICD-10-CM | POA: Diagnosis not present

## 2017-07-31 DIAGNOSIS — M5441 Lumbago with sciatica, right side: Secondary | ICD-10-CM | POA: Diagnosis not present

## 2017-07-31 DIAGNOSIS — Z79890 Hormone replacement therapy (postmenopausal): Secondary | ICD-10-CM

## 2017-07-31 DIAGNOSIS — R51 Headache: Secondary | ICD-10-CM | POA: Diagnosis not present

## 2017-07-31 HISTORY — PX: LAMINECTOMY WITH POSTERIOR LATERAL ARTHRODESIS LEVEL 2: SHX6336

## 2017-07-31 SURGERY — LAMINECTOMY WITH POSTERIOR LATERAL ARTHRODESIS LEVEL 2
Anesthesia: General | Site: Spine Lumbar

## 2017-07-31 MED ORDER — VANCOMYCIN HCL 1000 MG IV SOLR
INTRAVENOUS | Status: DC | PRN
  Administered 2017-07-31: 1000 mg via TOPICAL

## 2017-07-31 MED ORDER — DEXAMETHASONE SODIUM PHOSPHATE 10 MG/ML IJ SOLN
INTRAMUSCULAR | Status: AC
  Filled 2017-07-31: qty 1

## 2017-07-31 MED ORDER — MEPERIDINE HCL 25 MG/ML IJ SOLN: 6.2500 mg | INTRAMUSCULAR | Status: DC | PRN

## 2017-07-31 MED ORDER — VANCOMYCIN HCL IN DEXTROSE 1-5 GM/200ML-% IV SOLN
1000.0000 mg | Freq: Once | INTRAVENOUS | Status: AC
  Administered 2017-08-01: 1000 mg via INTRAVENOUS
  Filled 2017-07-31: qty 200

## 2017-07-31 MED ORDER — ACETAMINOPHEN 650 MG RE SUPP: 650.0000 mg | RECTAL | Status: DC | PRN

## 2017-07-31 MED ORDER — PHENYLEPHRINE 40 MCG/ML (10ML) SYRINGE FOR IV PUSH (FOR BLOOD PRESSURE SUPPORT)
PREFILLED_SYRINGE | INTRAVENOUS | Status: AC
  Filled 2017-07-31: qty 10

## 2017-07-31 MED ORDER — BUPIVACAINE HCL (PF) 0.25 % IJ SOLN
INTRAMUSCULAR | Status: AC
  Filled 2017-07-31: qty 30

## 2017-07-31 MED ORDER — FENTANYL CITRATE (PF) 250 MCG/5ML IJ SOLN
INTRAMUSCULAR | Status: AC
  Filled 2017-07-31: qty 5

## 2017-07-31 MED ORDER — MONTELUKAST SODIUM 10 MG PO TABS
10.0000 mg | ORAL_TABLET | Freq: Every day | ORAL | Status: DC
  Administered 2017-07-31 – 2017-08-04 (×5): 10 mg via ORAL
  Filled 2017-07-31 (×5): qty 1

## 2017-07-31 MED ORDER — VANCOMYCIN HCL 1000 MG IV SOLR
INTRAVENOUS | Status: DC | PRN
  Administered 2017-07-31: 1250 mg via INTRAVENOUS

## 2017-07-31 MED ORDER — ONDANSETRON HCL 4 MG PO TABS: 4.0000 mg | ORAL_TABLET | Freq: Four times a day (QID) | ORAL | Status: DC | PRN

## 2017-07-31 MED ORDER — FLUTICASONE PROPIONATE 50 MCG/ACT NA SUSP: 2.0000 | Freq: Every day | NASAL | Status: DC | PRN

## 2017-07-31 MED ORDER — ARTIFICIAL TEARS OPHTHALMIC OINT
TOPICAL_OINTMENT | OPHTHALMIC | Status: DC | PRN
  Administered 2017-07-31: 1 via OPHTHALMIC

## 2017-07-31 MED ORDER — ESTRADIOL 1 MG PO TABS
1.0000 mg | ORAL_TABLET | Freq: Every day | ORAL | Status: DC
  Administered 2017-08-01 – 2017-08-05 (×5): 1 mg via ORAL
  Filled 2017-07-31 (×5): qty 1

## 2017-07-31 MED ORDER — MENTHOL 3 MG MT LOZG
1.0000 | LOZENGE | OROMUCOSAL | Status: DC | PRN
  Administered 2017-08-01: 3 mg via ORAL
  Filled 2017-07-31: qty 9

## 2017-07-31 MED ORDER — THROMBIN (RECOMBINANT) 5000 UNITS EX SOLR
OROMUCOSAL | Status: DC | PRN
  Administered 2017-07-31: 15:00:00 via TOPICAL

## 2017-07-31 MED ORDER — B COMPLEX-C PO TABS
1.0000 | ORAL_TABLET | Freq: Every day | ORAL | Status: DC
  Administered 2017-07-31 – 2017-08-05 (×3): 1 via ORAL
  Filled 2017-07-31 (×6): qty 1

## 2017-07-31 MED ORDER — SUGAMMADEX SODIUM 200 MG/2ML IV SOLN
INTRAVENOUS | Status: AC
  Filled 2017-07-31: qty 2

## 2017-07-31 MED ORDER — DEXAMETHASONE SODIUM PHOSPHATE 10 MG/ML IJ SOLN
10.0000 mg | INTRAMUSCULAR | Status: DC
  Filled 2017-07-31: qty 1

## 2017-07-31 MED ORDER — VANCOMYCIN HCL 1000 MG IV SOLR
INTRAVENOUS | Status: AC
  Filled 2017-07-31: qty 1000

## 2017-07-31 MED ORDER — FOLIC ACID 1 MG PO TABS
1.0000 mg | ORAL_TABLET | Freq: Every day | ORAL | Status: DC
  Administered 2017-07-31 – 2017-08-05 (×5): 1 mg via ORAL
  Filled 2017-07-31 (×6): qty 1

## 2017-07-31 MED ORDER — VITAMIN B-12 1000 MCG PO TABS
500.0000 ug | ORAL_TABLET | Freq: Every day | ORAL | Status: DC
  Administered 2017-07-31 – 2017-08-05 (×4): 500 ug via ORAL
  Filled 2017-07-31 (×7): qty 1

## 2017-07-31 MED ORDER — DEXAMETHASONE SODIUM PHOSPHATE 4 MG/ML IJ SOLN
INTRAMUSCULAR | Status: DC | PRN
  Administered 2017-07-31: 10 mg via INTRAVENOUS

## 2017-07-31 MED ORDER — ROCURONIUM BROMIDE 10 MG/ML (PF) SYRINGE
PREFILLED_SYRINGE | INTRAVENOUS | Status: AC
  Filled 2017-07-31: qty 5

## 2017-07-31 MED ORDER — MAGNESIUM OXIDE 400 (241.3 MG) MG PO TABS
400.0000 mg | ORAL_TABLET | Freq: Every day | ORAL | Status: DC
  Administered 2017-07-31 – 2017-08-05 (×5): 400 mg via ORAL
  Filled 2017-07-31 (×6): qty 1

## 2017-07-31 MED ORDER — THROMBIN (RECOMBINANT) 5000 UNITS EX SOLR
CUTANEOUS | Status: AC
  Filled 2017-07-31: qty 5000

## 2017-07-31 MED ORDER — METHOCARBAMOL 1000 MG/10ML IJ SOLN
500.0000 mg | Freq: Four times a day (QID) | INTRAMUSCULAR | Status: DC | PRN
  Filled 2017-07-31: qty 5

## 2017-07-31 MED ORDER — EPHEDRINE 5 MG/ML INJ
INTRAVENOUS | Status: AC
  Filled 2017-07-31: qty 10

## 2017-07-31 MED ORDER — PROPOFOL 10 MG/ML IV BOLUS
INTRAVENOUS | Status: DC | PRN
  Administered 2017-07-31: 120 mg via INTRAVENOUS

## 2017-07-31 MED ORDER — SENNA 8.6 MG PO TABS
1.0000 | ORAL_TABLET | Freq: Two times a day (BID) | ORAL | Status: DC
  Administered 2017-07-31 – 2017-08-05 (×10): 8.6 mg via ORAL
  Filled 2017-07-31 (×10): qty 1

## 2017-07-31 MED ORDER — VITAMIN C 500 MG PO TABS
1000.0000 mg | ORAL_TABLET | Freq: Every day | ORAL | Status: DC
  Administered 2017-07-31 – 2017-08-05 (×4): 1000 mg via ORAL
  Filled 2017-07-31 (×6): qty 2

## 2017-07-31 MED ORDER — ASPIRIN 81 MG PO CHEW
81.0000 mg | CHEWABLE_TABLET | Freq: Every day | ORAL | Status: DC
  Administered 2017-07-31 – 2017-08-05 (×6): 81 mg via ORAL
  Filled 2017-07-31 (×6): qty 1

## 2017-07-31 MED ORDER — METHOCARBAMOL 500 MG PO TABS
500.0000 mg | ORAL_TABLET | Freq: Four times a day (QID) | ORAL | Status: DC | PRN
  Administered 2017-07-31 – 2017-08-05 (×9): 500 mg via ORAL
  Filled 2017-07-31 (×8): qty 1

## 2017-07-31 MED ORDER — PHENYLEPHRINE 40 MCG/ML (10ML) SYRINGE FOR IV PUSH (FOR BLOOD PRESSURE SUPPORT)
PREFILLED_SYRINGE | INTRAVENOUS | Status: DC | PRN
  Administered 2017-07-31 (×2): 80 ug via INTRAVENOUS

## 2017-07-31 MED ORDER — ROCURONIUM BROMIDE 100 MG/10ML IV SOLN
INTRAVENOUS | Status: DC | PRN
  Administered 2017-07-31 (×2): 20 mg via INTRAVENOUS
  Administered 2017-07-31: 50 mg via INTRAVENOUS

## 2017-07-31 MED ORDER — LIDOCAINE 2% (20 MG/ML) 5 ML SYRINGE
INTRAMUSCULAR | Status: AC
  Filled 2017-07-31: qty 5

## 2017-07-31 MED ORDER — METHOCARBAMOL 500 MG PO TABS
ORAL_TABLET | ORAL | Status: AC
  Filled 2017-07-31: qty 1

## 2017-07-31 MED ORDER — HEMOSTATIC AGENTS (NO CHARGE) OPTIME
TOPICAL | Status: DC | PRN
  Administered 2017-07-31 (×2): 1 via TOPICAL

## 2017-07-31 MED ORDER — SODIUM CHLORIDE 0.9 % IR SOLN
Status: DC | PRN
  Administered 2017-07-31: 15:00:00

## 2017-07-31 MED ORDER — CHLORHEXIDINE GLUCONATE CLOTH 2 % EX PADS: 6.0000 | MEDICATED_PAD | Freq: Once | CUTANEOUS | Status: DC

## 2017-07-31 MED ORDER — CALCIUM CARBONATE-VITAMIN D 500-200 MG-UNIT PO TABS
1.0000 | ORAL_TABLET | Freq: Every day | ORAL | Status: DC
  Administered 2017-07-31 – 2017-08-05 (×3): 1 via ORAL
  Filled 2017-07-31 (×8): qty 1

## 2017-07-31 MED ORDER — SODIUM CHLORIDE 0.9% FLUSH: 3.0000 mL | INTRAVENOUS | Status: DC | PRN

## 2017-07-31 MED ORDER — FENTANYL CITRATE (PF) 100 MCG/2ML IJ SOLN
INTRAMUSCULAR | Status: DC | PRN
  Administered 2017-07-31 (×2): 50 ug via INTRAVENOUS
  Administered 2017-07-31: 150 ug via INTRAVENOUS
  Administered 2017-07-31: 25 ug via INTRAVENOUS

## 2017-07-31 MED ORDER — VITAMIN D 1000 UNITS PO TABS
1000.0000 [IU] | ORAL_TABLET | Freq: Every day | ORAL | Status: DC
  Administered 2017-07-31 – 2017-08-05 (×3): 1000 [IU] via ORAL
  Filled 2017-07-31 (×6): qty 1

## 2017-07-31 MED ORDER — LEVOTHYROXINE SODIUM 88 MCG PO TABS
88.0000 ug | ORAL_TABLET | Freq: Every day | ORAL | Status: DC
  Administered 2017-08-01 – 2017-08-05 (×5): 88 ug via ORAL
  Filled 2017-07-31 (×5): qty 1

## 2017-07-31 MED ORDER — ACETAMINOPHEN 325 MG PO TABS: 650.0000 mg | ORAL_TABLET | ORAL | Status: DC | PRN

## 2017-07-31 MED ORDER — HYDROMORPHONE HCL 1 MG/ML IJ SOLN
0.2500 mg | INTRAMUSCULAR | Status: DC | PRN
  Administered 2017-07-31: 0.5 mg via INTRAVENOUS
  Administered 2017-07-31 (×2): 0.25 mg via INTRAVENOUS

## 2017-07-31 MED ORDER — OXYCODONE HCL 5 MG PO TABS
10.0000 mg | ORAL_TABLET | ORAL | Status: DC | PRN
  Administered 2017-08-01 – 2017-08-05 (×12): 10 mg via ORAL
  Filled 2017-07-31 (×13): qty 2

## 2017-07-31 MED ORDER — BIOTIN 10 MG PO TABS: 1.0000 | ORAL_TABLET | Freq: Every morning | ORAL | Status: DC

## 2017-07-31 MED ORDER — GLYCOPYRROLATE 0.2 MG/ML IJ SOLN
INTRAMUSCULAR | Status: DC | PRN
  Administered 2017-07-31: 0.1 mg via INTRAVENOUS

## 2017-07-31 MED ORDER — SODIUM CHLORIDE 0.9% FLUSH
3.0000 mL | Freq: Two times a day (BID) | INTRAVENOUS | Status: DC
  Administered 2017-07-31 – 2017-08-04 (×8): 3 mL via INTRAVENOUS

## 2017-07-31 MED ORDER — MORPHINE SULFATE (PF) 2 MG/ML IV SOLN
2.0000 mg | INTRAVENOUS | Status: DC | PRN
  Administered 2017-07-31 – 2017-08-05 (×4): 2 mg via INTRAVENOUS
  Filled 2017-07-31 (×4): qty 1

## 2017-07-31 MED ORDER — PHENOL 1.4 % MT LIQD: 1.0000 | OROMUCOSAL | Status: DC | PRN

## 2017-07-31 MED ORDER — POTASSIUM CHLORIDE IN NACL 20-0.9 MEQ/L-% IV SOLN
INTRAVENOUS | Status: DC
  Administered 2017-07-31 – 2017-08-01 (×2): via INTRAVENOUS
  Filled 2017-07-31 (×2): qty 1000

## 2017-07-31 MED ORDER — 0.9 % SODIUM CHLORIDE (POUR BTL) OPTIME
TOPICAL | Status: DC | PRN
  Administered 2017-07-31: 1000 mL

## 2017-07-31 MED ORDER — ONDANSETRON HCL 4 MG/2ML IJ SOLN
INTRAMUSCULAR | Status: DC | PRN
  Administered 2017-07-31: 4 mg via INTRAVENOUS

## 2017-07-31 MED ORDER — ONDANSETRON HCL 4 MG/2ML IJ SOLN
INTRAMUSCULAR | Status: AC
  Filled 2017-07-31: qty 2

## 2017-07-31 MED ORDER — ONDANSETRON HCL 4 MG/2ML IJ SOLN: 4.0000 mg | Freq: Once | INTRAMUSCULAR | Status: DC | PRN

## 2017-07-31 MED ORDER — ADULT MULTIVITAMIN W/MINERALS CH
1.0000 | ORAL_TABLET | Freq: Every day | ORAL | Status: DC
  Administered 2017-07-31 – 2017-08-05 (×3): 1 via ORAL
  Filled 2017-07-31 (×6): qty 1

## 2017-07-31 MED ORDER — BUPIVACAINE HCL (PF) 0.25 % IJ SOLN
INTRAMUSCULAR | Status: DC | PRN
  Administered 2017-07-31: 4 mL

## 2017-07-31 MED ORDER — LIDOCAINE 2% (20 MG/ML) 5 ML SYRINGE
INTRAMUSCULAR | Status: DC | PRN
  Administered 2017-07-31: 100 mg via INTRAVENOUS

## 2017-07-31 MED ORDER — ONDANSETRON HCL 4 MG/2ML IJ SOLN: 4.0000 mg | Freq: Four times a day (QID) | INTRAMUSCULAR | Status: DC | PRN

## 2017-07-31 MED ORDER — PROPOFOL 10 MG/ML IV BOLUS
INTRAVENOUS | Status: AC
  Filled 2017-07-31: qty 20

## 2017-07-31 MED ORDER — LACTATED RINGERS IV SOLN
INTRAVENOUS | Status: DC | PRN
  Administered 2017-07-31 (×2): via INTRAVENOUS

## 2017-07-31 MED ORDER — SODIUM CHLORIDE 0.9 % IV SOLN: 250.0000 mL | INTRAVENOUS | Status: DC

## 2017-07-31 MED ORDER — PHENYLEPHRINE HCL 10 MG/ML IJ SOLN
INTRAVENOUS | Status: DC | PRN
  Administered 2017-07-31: 60 ug/min via INTRAVENOUS

## 2017-07-31 MED ORDER — HYDROMORPHONE HCL 1 MG/ML IJ SOLN
INTRAMUSCULAR | Status: AC
  Filled 2017-07-31: qty 1

## 2017-07-31 MED ORDER — ACETAMINOPHEN 500 MG PO TABS
1000.0000 mg | ORAL_TABLET | Freq: Four times a day (QID) | ORAL | Status: AC
  Administered 2017-08-01 (×3): 1000 mg via ORAL
  Filled 2017-07-31 (×3): qty 2

## 2017-07-31 SURGICAL SUPPLY — 59 items
BAG DECANTER FOR FLEXI CONT (MISCELLANEOUS) ×3 IMPLANT
BASKET BONE COLLECTION (BASKET) ×3 IMPLANT
BENZOIN TINCTURE PRP APPL 2/3 (GAUZE/BANDAGES/DRESSINGS) ×3 IMPLANT
BONE CANC CHIPS 40CC CAN1/2 (Bone Implant) ×3 IMPLANT
BUR MATCHSTICK NEURO 3.0 LAGG (BURR) ×3 IMPLANT
CANISTER SUCT 3000ML PPV (MISCELLANEOUS) ×3 IMPLANT
CARTRIDGE OIL MAESTRO DRILL (MISCELLANEOUS) ×1 IMPLANT
CHIPS CANC BONE 40CC CAN1/2 (Bone Implant) ×1 IMPLANT
CLOSURE WOUND 1/2 X4 (GAUZE/BANDAGES/DRESSINGS) ×1
CONT SPEC 4OZ CLIKSEAL STRL BL (MISCELLANEOUS) ×3 IMPLANT
COVER BACK TABLE 60X90IN (DRAPES) ×3 IMPLANT
DERMABOND ADVANCED (GAUZE/BANDAGES/DRESSINGS) ×2
DERMABOND ADVANCED .7 DNX12 (GAUZE/BANDAGES/DRESSINGS) ×1 IMPLANT
DIFFUSER DRILL AIR PNEUMATIC (MISCELLANEOUS) ×3 IMPLANT
DRAPE C-ARM 42X72 X-RAY (DRAPES) IMPLANT
DRAPE LAPAROTOMY 100X72X124 (DRAPES) ×3 IMPLANT
DRAPE POUCH INSTRU U-SHP 10X18 (DRAPES) ×3 IMPLANT
DRAPE SURG 17X23 STRL (DRAPES) ×3 IMPLANT
DRSG OPSITE POSTOP 4X10 (GAUZE/BANDAGES/DRESSINGS) ×3 IMPLANT
DURAPREP 26ML APPLICATOR (WOUND CARE) ×3 IMPLANT
ELECT REM PT RETURN 9FT ADLT (ELECTROSURGICAL) ×3
ELECTRODE REM PT RTRN 9FT ADLT (ELECTROSURGICAL) ×1 IMPLANT
EVACUATOR 1/8 PVC DRAIN (DRAIN) IMPLANT
GAUZE SPONGE 4X4 16PLY XRAY LF (GAUZE/BANDAGES/DRESSINGS) IMPLANT
GLOVE BIO SURGEON STRL SZ7 (GLOVE) IMPLANT
GLOVE BIO SURGEON STRL SZ8 (GLOVE) ×6 IMPLANT
GLOVE BIOGEL PI IND STRL 7.0 (GLOVE) ×2 IMPLANT
GLOVE BIOGEL PI IND STRL 7.5 (GLOVE) ×2 IMPLANT
GLOVE BIOGEL PI IND STRL 8 (GLOVE) ×2 IMPLANT
GLOVE BIOGEL PI INDICATOR 7.0 (GLOVE) ×4
GLOVE BIOGEL PI INDICATOR 7.5 (GLOVE) ×4
GLOVE BIOGEL PI INDICATOR 8 (GLOVE) ×4
GOWN STRL REUS W/ TWL LRG LVL3 (GOWN DISPOSABLE) IMPLANT
GOWN STRL REUS W/ TWL XL LVL3 (GOWN DISPOSABLE) ×2 IMPLANT
GOWN STRL REUS W/TWL 2XL LVL3 (GOWN DISPOSABLE) IMPLANT
GOWN STRL REUS W/TWL LRG LVL3 (GOWN DISPOSABLE)
GOWN STRL REUS W/TWL XL LVL3 (GOWN DISPOSABLE) ×4
HEMOSTAT POWDER KIT SURGIFOAM (HEMOSTASIS) IMPLANT
KIT BASIN OR (CUSTOM PROCEDURE TRAY) ×3 IMPLANT
KIT ROOM TURNOVER OR (KITS) ×3 IMPLANT
NEEDLE ASP BONE MRW 8GX15 (NEEDLE) ×3 IMPLANT
NEEDLE HYPO 25X1 1.5 SAFETY (NEEDLE) ×3 IMPLANT
NS IRRIG 1000ML POUR BTL (IV SOLUTION) ×3 IMPLANT
OIL CARTRIDGE MAESTRO DRILL (MISCELLANEOUS) ×3
PACK LAMINECTOMY NEURO (CUSTOM PROCEDURE TRAY) ×3 IMPLANT
PAD ARMBOARD 7.5X6 YLW CONV (MISCELLANEOUS) ×9 IMPLANT
PUTTY DBM ALLOSYNC PURE 10CC (Putty) ×3 IMPLANT
SEALANT ADHERUS EXTEND TIP (MISCELLANEOUS) ×3 IMPLANT
SPONGE LAP 4X18 X RAY DECT (DISPOSABLE) IMPLANT
SPONGE SURGIFOAM ABS GEL 100 (HEMOSTASIS) ×3 IMPLANT
STRIP CLOSURE SKIN 1/2X4 (GAUZE/BANDAGES/DRESSINGS) ×2 IMPLANT
SUT VIC AB 0 CT1 18XCR BRD8 (SUTURE) ×1 IMPLANT
SUT VIC AB 0 CT1 8-18 (SUTURE) ×2
SUT VIC AB 2-0 CP2 18 (SUTURE) ×3 IMPLANT
SUT VIC AB 3-0 SH 8-18 (SUTURE) ×6 IMPLANT
TOWEL GREEN STERILE (TOWEL DISPOSABLE) ×3 IMPLANT
TOWEL GREEN STERILE FF (TOWEL DISPOSABLE) ×3 IMPLANT
TRAY FOLEY W/METER SILVER 16FR (SET/KITS/TRAYS/PACK) ×3 IMPLANT
WATER STERILE IRR 1000ML POUR (IV SOLUTION) ×3 IMPLANT

## 2017-07-31 NOTE — Op Note (Signed)
07/31/2017  4:09 PM  PATIENT:  Linda Mcgrath  83 y.o. female  PRE-OPERATIVE DIAGNOSIS:  Lumbar spinal stenosis L4-5 and L5-S1, spondylolisthesis L5-S1, back and leg pain  POST-OPERATIVE DIAGNOSIS:  same  PROCEDURE:   1. Decompressive lumbar hemilaminectomy, medial facetectomy and foraminotomy L4-5 and L5-S1 bilaterally   2. Intertransverse arthrodesis L4-S1 bilaterally using morcellized autograft and allograft soaked with bone marrow aspirate obtained through a separate fascial incision over the right iliac crest.  SURGEON:  Sherley Bounds, MD  ASSISTANTS: Glenford Peers FNP  ANESTHESIA:  General  EBL: 100 ml  Total I/O In: 1000 [I.V.:1000] Out: 375 [Urine:275; Blood:100]  BLOOD ADMINISTERED:none  DRAINS: none   INDICATION FOR PROCEDURE: This patient presented with severe back and leg pain. Imaging revealed spondylolisthesis L5-S1 with spinal stenosis L4-5 and L5-S1. The patient tried a reasonable attempt at conservative medical measures without relief. I recommended decompression and instrumented fusion to address the stenosis as well as the segmental  instability.  Patient understood the risks, benefits, and alternatives and potential outcomes and wished to proceed.  PROCEDURE DETAILS:  The patient was brought to the operating room. After induction of generalized endotracheal anesthesia the patient was rolled into the prone position on chest rolls and all pressure points were padded. The patient's lumbar region was cleaned and then prepped with DuraPrep and draped in the usual sterile fashion. Anesthesia was injected and then a dorsal midline incision was made and carried down to the lumbosacral fascia. The fascia was opened and the paraspinous musculature was taken down in a subperiosteal fashion to expose . A self-retaining retractor was placed. Intraoperative fluoroscopy confirmed my level, and I exposed the entry zone for the cortical screws at L4. I pulled on the  spinous processes and really there was no significant movement at L4-5 or L5-S1. The facets were significantly overgrown and appeared to be fused. There was minimal with any motion at L5-S1 which was the level of the spondylolisthesis and only minor motion at L4-5. I did not feel there was gross instability at either level. Therefore I decided against placement of segmental fixation. I did expose the transverse processes 4 and on lay posterolateral intertransverse arthrodesis L4-S1. I then turned my attention to the decompression and complete lumbar hemi-laminectomies, medial facetectomies, and foraminotomies were performed at L4-5 and L5-S1. The patient had significant spinal stenosis. Unfortunately, while decompressing over the left L5 nerve root there was an unintended durotomy. I felt it was irreparable. It was small but it was over the lateral part of the nerve root and her dura was extremely thin and friable and I felt that repair would simply tear the dura further. I packed this off and then finished my decompression bilaterally at both levels.  Much more generous decompression and generous foraminotomy was undertaken in order to adequately decompress the neural elements and address the patient's leg pain. The yellow ligament was removed to expose the underlying dura and nerve roots, and generous foraminotomies were performed to adequately decompress the neural elements. Both  nerve roots were decompressed on both sides until a coronary dilator passed easily along the nerve roots. I dissected in a suprafascial plane over the right iliac crest and open the fascia to expose the iliac crest. Used an imbibe needle to extract about 12 mL of bone marrow aspirate from the right iliac crest. I used this to soak allograft. A dried the small hole in the iliac crest and then closed the fascia with interrupted 0 Vicryl. I decorticated the  transverse processes and the sacral alar bilaterally and packed a mixture of  autograft and bone marrow soaked allograft to perform intertransverse arthrodesis bilaterally L4-S1. And then removed the packing from the small dural tear and placed a strip of muscle over this and washed it for a few minutes and saw no significant leakage around the muscle. I then used Tisseel fibrin glue and Gelfoam at the L4-5 level. Irrigated with copious amounts of bacitracin-containing saline solution. Inspected the nerve roots once again to assure adequate decompression, lined to the remaining dura with Gelfoam, placed powdered vancomycin into the wound, and closed the muscle and the fascia with 0 Vicryl. Closed the subcutaneous tissues with 2-0 Vicryl and subcuticular tissues with 3-0 Vicryl. The skin was closed with Dermabond, benzoin and Steri-Strips. A sterile Dressing was then applied, the patient was awakened from general anesthesia and transported to the recovery room in stable condition. At the end of the procedure all sponge, needle and instrument counts were correct.   PLAN OF CARE: admit to inpatient  PATIENT DISPOSITION:  PACU - hemodynamically stable.   Delay start of Pharmacological VTE agent (>24hrs) due to surgical blood loss or risk of bleeding:  yes

## 2017-07-31 NOTE — Anesthesia Preprocedure Evaluation (Signed)
Anesthesia Evaluation  Patient identified by MRN, date of birth, ID band Patient awake    Reviewed: Allergy & Precautions, NPO status , Patient's Chart, lab work & pertinent test results  Airway Mallampati: I  TM Distance: >3 FB Neck ROM: Full    Dental   Pulmonary    Pulmonary exam normal        Cardiovascular hypertension, Pt. on medications Normal cardiovascular exam     Neuro/Psych    GI/Hepatic   Endo/Other    Renal/GU      Musculoskeletal   Abdominal   Peds  Hematology   Anesthesia Other Findings   Reproductive/Obstetrics                             Anesthesia Physical Anesthesia Plan  ASA: III  Anesthesia Plan: General   Post-op Pain Management:    Induction: Intravenous  PONV Risk Score and Plan: 3 and Ondansetron, Dexamethasone and Treatment may vary due to age or medical condition  Airway Management Planned: Oral ETT  Additional Equipment:   Intra-op Plan:   Post-operative Plan: Extubation in OR  Informed Consent: I have reviewed the patients History and Physical, chart, labs and discussed the procedure including the risks, benefits and alternatives for the proposed anesthesia with the patient or authorized representative who has indicated his/her understanding and acceptance.     Plan Discussed with: CRNA and Surgeon  Anesthesia Plan Comments:         Anesthesia Quick Evaluation

## 2017-07-31 NOTE — Transfer of Care (Signed)
Immediate Anesthesia Transfer of Care Note  Patient: Linda Mcgrath  Procedure(s) Performed: DECOMPRESSIVE LAMINECTOMY LUMABR FOUR-FIVE, LUMBAR FIVE-SACRAL ONE (N/A Spine Lumbar)  Patient Location: PACU  Anesthesia Type:General  Level of Consciousness: awake and patient cooperative  Airway & Oxygen Therapy: Patient Spontanous Breathing and Patient connected to face mask oxygen  Post-op Assessment: Report given to RN and Post -op Vital signs reviewed and stable  Post vital signs: Reviewed and stable  Last Vitals:  Vitals:   07/31/17 1049  BP: (!) 160/74  Pulse: 81  Resp: 18  Temp: 36.5 C  SpO2: 96%    Last Pain:  Vitals:   07/31/17 1049  TempSrc: Oral  PainSc: 4       Patients Stated Pain Goal: 3 (06/02/11 1975)  Complications: No apparent anesthesia complications

## 2017-07-31 NOTE — Anesthesia Procedure Notes (Addendum)
Procedure Name: Intubation Date/Time: 07/31/2017 1:12 PM Performed by: Orlie Dakin, CRNA Pre-anesthesia Checklist: Patient identified, Emergency Drugs available, Suction available, Timeout performed and Patient being monitored Patient Re-evaluated:Patient Re-evaluated prior to induction Oxygen Delivery Method: Circle system utilized Preoxygenation: Pre-oxygenation with 100% oxygen Induction Type: IV induction Ventilation: Mask ventilation without difficulty Laryngoscope Size: Miller and 3 Grade View: Grade I Tube type: Oral Tube size: 7.0 mm Number of attempts: 1 Airway Equipment and Method: Stylet Placement Confirmation: ETT inserted through vocal cords under direct vision,  positive ETCO2 and breath sounds checked- equal and bilateral Secured at: 22 cm Tube secured with: Tape Dental Injury: Teeth and Oropharynx as per pre-operative assessment  Comments: 4x4s bite block used.

## 2017-07-31 NOTE — Anesthesia Postprocedure Evaluation (Signed)
Anesthesia Post Note  Patient: Linda Mcgrath  Procedure(s) Performed: DECOMPRESSIVE LAMINECTOMY LUMABR FOUR-FIVE, LUMBAR FIVE-SACRAL ONE (N/A Spine Lumbar)     Patient location during evaluation: PACU Anesthesia Type: General Level of consciousness: awake and alert Pain management: pain level controlled Vital Signs Assessment: post-procedure vital signs reviewed and stable Respiratory status: spontaneous breathing, nonlabored ventilation, respiratory function stable and patient connected to nasal cannula oxygen Cardiovascular status: blood pressure returned to baseline and stable Postop Assessment: no apparent nausea or vomiting Anesthetic complications: no    Last Vitals:  Vitals:   07/31/17 1730 07/31/17 1745  BP:    Pulse: 80 63  Resp: 13 15  Temp:    SpO2: 98% 99%    Last Pain:  Vitals:   07/31/17 1745  TempSrc:   PainSc: Asleep                 Thomasina Housley DAVID

## 2017-07-31 NOTE — H&P (Signed)
Subjective: Patient is a 83 y.o. female admitted for back and leg pain. Onset of symptoms was several years ago, gradually worsening since that time.  The pain is rated severe, and is located at the across the lower back and radiates to legs. The pain is described as aching and occurs all day. The symptoms have been progressive. Symptoms are exacerbated by exercise. MRI or CT showed spondylolisthesis with stenosis   Past Medical History:  Diagnosis Date  . Allergic rhinitis   . Asthma   . Cataract    Bil/lens implant  . Colon polyp 1999   small polyp  . Diverticulosis   . Dysrhythmia   . Fibromyalgia   . Hyperlipidemia   . Hypothyroidism   . Internal hemorrhoids   . Leg cramps   . Lung nodule    stable LUL 9 mm  . Menopausal syndrome   . Osteoarthritis   . UTI (urinary tract infection)     Past Surgical History:  Procedure Laterality Date  . Abd U/S  11/1998   negative  . Abd U/S  01/2001   gallbladder polyps  . ABDOMINAL HYSTERECTOMY  1975   total-fibroids  . APPENDECTOMY  1975  . BREAST BIOPSY     benign  . BREAST EXCISIONAL BIOPSY Left 1957  . CARDIOVERSION N/A 03/07/2016   Procedure: CARDIOVERSION;  Surgeon: Adrian Prows, MD;  Location: [place];  Service: Cardiovascular;  Laterality: N/A;  . CATARACT EXTRACTION     bilateral  . CHOLECYSTECTOMY    . COLONOSCOPY  1998   Diverticulosis; polyp\  . COLONOSCOPY  09/2002   Diverticulosis, hem  . COLONOSCOPY  12/2007   diverticulosis, polyp  . DEXA  10/2001   osteopenia  . ELECTROPHYSIOLOGIC STUDY N/A 04/03/2016   Procedure: A-Flutter Ablation;  Surgeon: Will Meredith Leeds, MD;  Location: [place] [facility];  Service: Cardiovascular;  Laterality: N/A;  . ESOPHAGOGASTRODUODENOSCOPY  2004  . [place] scan  01/2001   Negative  . KNEE SURGERY     right knee / 11/2004  . laser surgery for glaucoma Left Sep 21, 2014  . ROTATOR CUFF REPAIR     x 2 /right shoulder  . SKIN CANCER EXCISION     pre-melanoma / on face  . TEE  WITHOUT CARDIOVERSION N/A 03/07/2016   Procedure: TRANSESOPHAGEAL ECHOCARDIOGRAM (TEE);  Surgeon: Adrian Prows, MD;  Location: [facility];  Service: Cardiovascular;  Laterality: N/A;  . TOE SURGERY     rt foot     Prior to Admission medications   Medication Sig Start Date End Date Taking? Authorizing Provider  Ascorbic Acid (VITAMIN C) 1000 MG tablet Take 1,000 mg by mouth daily.     Yes [provider]  aspirin 81 MG tablet Take 81 mg by mouth daily.   Yes [provider]  AZO-CRANBERRY PO Take 1 capsule by mouth 2 (two) times daily.   Yes [provider]  B Complex Vitamins (VITAMIN B COMPLEX PO) Take 1 tablet by mouth daily.   Yes [provider]  Biotin 10 MG TABS Take 1 tablet by mouth every morning.    Yes [provider]  Calcium Citrate-Vitamin D (CALCIUM CITRATE + D) 250-200 MG-UNIT TABS Take 1 tablet by mouth daily.   Yes [provider]  cholecalciferol (VITAMIN D) 1000 units tablet Take 1,000 Units by mouth daily.   Yes [provider]  cyclobenzaprine (FLEXERIL) 10 MG tablet TAKE 1 TABLET (10 MG TOTAL) BY MOUTH AT BEDTIME AS NEEDED. Patient  taking differently: Take 10 mg by mouth at bedtime.  07/07/17  Yes [name], Wynelle Fanny, MD  estradiol (ESTRACE) 1 MG tablet TAKE 1 TABLET (1 MG TOTAL) BY MOUTH DAILY. 02/06/17  Yes [name], Wynelle Fanny, MD  Fexofenadine HCl [facility] ALLERGY PO) Take 1 tablet by mouth daily as needed (Allergies).    Yes [provider]  fluticasone (FLONASE) 50 MCG/ACT nasal spray Place 2 sprays into both nostrils daily as needed for allergies.    Yes [provider]  folic acid (FOLVITE) 1 MG tablet Take 1 mg by mouth daily.   Yes [provider]  ibuprofen (ADVIL,MOTRIN) 200 MG tablet Take 400 mg by mouth at bedtime. For leg pain/discomfort   Yes [provider]  levothyroxine (SYNTHROID, LEVOTHROID) 88 MCG tablet Take 1 tablet (88 mcg total) by mouth daily with breakfast.  11/15/16  Yes [name], Wynelle Fanny, MD  loratadine (CLARITIN) 10 MG tablet Take 10 mg by mouth daily as needed for allergies.    Yes [provider]  MAGNESIUM-OXIDE PO Take 1 tablet by mouth daily.   Yes [provider]  montelukast (SINGULAIR) 10 MG tablet Take 1 tablet (10 mg total) by mouth at bedtime. 03/04/17  Yes [name], Wynelle Fanny, MD  Multiple Vitamin (MULTIVITAMIN) capsule Take 1 capsule by mouth daily.     Yes [provider]  TURMERIC PO Take 1 capsule by mouth 2 (two) times daily.    Yes [provider]  vitamin B-12 (CYANOCOBALAMIN) 500 MCG tablet Take 500 mcg by mouth daily.   Yes [provider]   Allergies  Allergen Reactions  . Shellfish Allergy Anaphylaxis and Nausea And Vomiting  . Tetracycline Hives, Nausea And Vomiting, Rash and Other (See Comments)    SYSTEMIC REACTION: heart palpitations,muscle spasms, vomiting  . Amlodipine Besylate Other (See Comments)    REACTION: muscle spasms, extremity swelling, low bp  . Ciprofloxacin Other (See Comments)    REACTION: muscle spasms, insomnia  . Codeine Other (See Comments)    REACTION: hallucinations  . Propoxyphene Hcl Other (See Comments)    Unknown  . Sulfamethoxazole-Trimethoprim Nausea Only and Other (See Comments)    REACTION: dizzy, could not focus eyes and could not concentrate and nausea  . Tape Other (See Comments)    BANDAIDS TAKE OFF HER SKIN; PLEASE USE COBAN OR PAPER   . Xarelto [Rivaroxaban] Other (See Comments)    Aches and pains and nervousness  . Amoxicillin Rash  . 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 Penicillins Swelling, Rash and Other (See Comments)    Has patient had a PCN reaction causing immediate rash, facial/tongue/throat swelling, SOB or lightheadedness with hypotension: Yes Has patient had  a PCN reaction causing severe rash involving mucus membranes or skin necrosis: No Has patient had a PCN reaction that required hospitalization No Has patient had a PCN reaction occurring within the last 10 years: No If all of the above answers are "NO", then may proceed with Cephalosporin use.     Social History   Tobacco Use  . Smoking status: Never Smoker  . Smokeless tobacco: Never Used  Substance Use Topics  . Alcohol use: No    Alcohol/week: 0.0 oz    Comment: occasional-rare    Family History  Problem Relation Age of Onset  . Parkinsonism  Father   . Colon cancer Brother   . Allergies Mother   . Heart disease Mother   . Kidney failure Son        congenital     Review of Systems  Positive ROS: neg  All other systems have been reviewed and were otherwise negative with the exception of those mentioned in the HPI and as above.  Objective: Vital signs in last 24 hours: Temp:  [97.7 F (36.5 C)] 97.7 F (36.5 C) (12/06 1049) Pulse Rate:  [81] 81 (12/06 1049) Resp:  [18] 18 (12/06 1049) BP: (160)/(74) 160/74 (12/06 1049) SpO2:  [96 %] 96 % (12/06 1049) Weight:  [81.7 kg (180 lb 3.2 oz)] 81.7 kg (180 lb 3.2 oz) (12/06 1049)  General Appearance: Alert, cooperative, no distress, appears stated age Head: Normocephalic, without obvious abnormality, atraumatic Eyes: PERRL, conjunctiva/corneas clear, EOM's intact    Neck: Supple, symmetrical, trachea midline Back: Symmetric, no curvature, ROM normal, no CVA tenderness Lungs:  respirations unlabored Heart: Regular rate and rhythm Abdomen: Soft, non-tender Extremities: Extremities normal, atraumatic, no cyanosis or edema Pulses: 2+ and symmetric all extremities Skin: Skin color, texture, turgor normal, no rashes or lesions  NEUROLOGIC:   Mental status: Alert and oriented x4,  no aphasia, good attention span, fund of knowledge, and memory Motor Exam - grossly normal Sensory Exam - grossly normal Reflexes:  1+ Coordination - grossly normal Gait - grossly normal Balance - grossly normal Cranial Nerves: I: smell Not tested  II: visual acuity  OS: nl    OD: nl  II: visual fields Full to confrontation  II: pupils Equal, round, reactive to light  III,VII: ptosis None  III,IV,VI: extraocular muscles  Full ROM  V: mastication Normal  V: facial light touch sensation  Normal  V,VII: corneal reflex  Present  VII: facial muscle function - upper  Normal  VII: facial muscle function - lower Normal  VIII: hearing Not tested  IX: soft palate elevation  Normal  IX,X: gag reflex Present  XI: trapezius strength  5/5  XI: sternocleidomastoid strength 5/5  XI: neck flexion strength  5/5  XII: tongue strength  Normal    Data Review Lab Results  Component Value Date   WBC 8.7 07/25/2017   HGB 15.7 (H) 07/25/2017   HCT 45.8 07/25/2017   MCV 93.9 07/25/2017   PLT 240 07/25/2017   Lab Results  Component Value Date   NA 138 07/25/2017   K 4.1 07/25/2017   CL 105 07/25/2017   CO2 25 07/25/2017   BUN 16 07/25/2017   CREATININE 1.06 (H) 07/25/2017   GLUCOSE 76 07/25/2017   Lab Results  Component Value Date   INR 1.04 07/25/2017    Assessment/Plan: Patient admitted for lumbar decompression and fusion. Patient has failed a reasonable attempt at conservative therapy.  I explained the condition and procedure to the patient and answered any questions.  Patient wishes to proceed with procedure as planned. Understands risks/ benefits and typical outcomes of procedure.   Jai Steil S 07/31/2017 12:37 PM

## 2017-07-31 NOTE — Progress Notes (Signed)
Pharmacy Antibiotic Note  Linda Mcgrath is a 83 y.o. female admitted on 07/31/2017 with surgical prophylaxis.  Pharmacy has been consulted for Vancomycin dosing for . Vancomycin 1250mg  received pre-operatively at 13:20PM.   Plan: Vancomycin 1g IV x1 dose - 12 hours post-operatively.  No further vancomycin as no drain in place.  Pharmacy will sign off consult.   Height: 5\' 4"  (162.6 cm) Weight: 180 lb 3.2 oz (81.7 kg) IBW/kg (Calculated) : 54.7  Temp (24hrs), Avg:97.4 F (36.3 C), Min:97 F (36.1 C), Max:97.7 F (36.5 C)  Recent Labs  Lab 07/25/17 1154  WBC 8.7  CREATININE 1.06*    Estimated Creatinine Clearance: 42.3 mL/min (A) (by C-G formula based on SCr of 1.06 mg/dL (H)).    Allergies  Allergen Reactions  . Shellfish Allergy Anaphylaxis and Nausea And Vomiting  . Tetracycline Hives, Nausea And Vomiting, Rash and Other (See Comments)    SYSTEMIC REACTION: heart palpitations,muscle spasms, vomiting  . Amlodipine Besylate Other (See Comments)    REACTION: muscle spasms, extremity swelling, low bp  . Ciprofloxacin Other (See Comments)    REACTION: muscle spasms, insomnia  . Codeine Other (See Comments)    REACTION: hallucinations  . Propoxyphene Hcl Other (See Comments)    Unknown  . Sulfamethoxazole-Trimethoprim Nausea Only and Other (See Comments)    REACTION: dizzy, could not focus eyes and could not concentrate and nausea  . Tape Other (See Comments)    BANDAIDS TAKE OFF HER SKIN; PLEASE USE COBAN OR PAPER   . Xarelto [Rivaroxaban] Other (See Comments)    Aches and pains and nervousness  . Amoxicillin Rash  . 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 Penicillins Swelling, Rash and Other (See Comments)    Has patient had a PCN reaction causing immediate rash, facial/tongue/throat swelling,  SOB or lightheadedness with hypotension: Yes Has patient had a PCN reaction causing severe rash involving mucus membranes or skin necrosis: No Has patient had a PCN reaction that required hospitalization No Has patient had a PCN reaction occurring within the last 10 years: No If all of the above answers are "NO", then may proceed with Cephalosporin use.     Antimicrobials this admission: Vancomycin pre and post-op   Dose adjustments this admission:   Microbiology results:   Thank you for allowing pharmacy to be a part of this patient's care.  Sloan Leiter, PharmD, BCPS, BCCCP Clinical Pharmacist Clinical phone 07/31/2017 until 3:30PM [phone] After hours, please call [phone] 07/31/2017 6:50 PM

## 2017-08-01 ENCOUNTER — Encounter (HOSPITAL_COMMUNITY): Payer: Self-pay | Admitting: Neurological Surgery

## 2017-08-01 NOTE — Consult Note (Signed)
[place] CM Primary Care Navigator  08/01/2017  Linda Mcgrath [birth year] [id number]   Went to seepatientin the room to identify possible discharge needs.  Patient reports having worsening back pain radiating to legs which had led to this admission/ surgery  Patientendorses Dr. Roxy Manns with [facility] as her primary [facility].   [facility] [place] [facility].   Patient verbalizedthatsheismanaging her own medications at [facility] use of "pill box" system filledonce a week.  Patientreports that she was driving prior to admission/ surgery. Patient's son (Lex) will be able to provide transportation to her doctors'appointments after discharge.   Patient states that son liveswith her and will serve as her primary caregiver at home.   Anticipated discharge plan is undetermined for now since she is currently on 3 days of bedrest. Await for therapy recommendation.  Patient hopes to be discharged home when ready.  Patientvoiced understanding to call primary care provider's office when she gets back home, for a post hospital follow-up appointment within 1-2 weeks or sooner if needs arise. Patient letter (with PCP'scontact number) was provided as a reminder.   Explained to patientregarding [facility] CM services available for health management at home butshe denies having current needs or concerns managing her health issues for now.  Patientexpressed understanding to seek referral to [facility] care managementservices from primary care providerif deemed necessary and appropriate for services in the future.   [facility] care management information provided for future needs thatshemay have.   Patienthowever, hadoptedand verbally agreedfor EMMI calls to follow-up withherrecovery at home.   Referral made for [facility] General calls after discharge.   For additional questions  please contact:  Melanee Left, BSN, RN-BC [place] PRIMARY CARE Navigator Cell: [phone]

## 2017-08-01 NOTE — Progress Notes (Signed)
Patient ID: Linda Mcgrath, female   DOB: Jun 17, 1933, 83 y.o.   MRN: [id number] Subjective: Patient reports mild frontal headache, had back and leg pain thru night but that has settled down  Objective: Vital signs in last 24 hours: Temp:  [97 F (36.1 C)-98.4 F (36.9 C)] 98.4 F (36.9 C) (12/07 0432) Pulse Rate:  [63-81] 72 (12/07 0432) Resp:  [13-18] 18 (12/07 0432) BP: (109-188)/(37-79) 188/77 (12/07 0432) SpO2:  [95 %-100 %] 100 % (12/07 0432) Weight:  [81.7 kg (180 lb 3.2 oz)] 81.7 kg (180 lb 3.2 oz) (12/06 1049)  Intake/Output from previous day: 12/06 0701 - 12/07 0700 In: 1500 [P.O.:180; I.V.:1000; IV Piggyback:200] Out: 455 [Urine:355; Blood:100] Intake/Output this shift: No intake/output data recorded.  Neurologic: Grossly normal, dressing flat and dry  Lab Results: Lab Results  Component Value Date   WBC 8.7 07/25/2017   HGB 15.7 (H) 07/25/2017   HCT 45.8 07/25/2017   MCV 93.9 07/25/2017   PLT 240 07/25/2017   Lab Results  Component Value Date   INR 1.04 07/25/2017   BMET Lab Results  Component Value Date   NA 138 07/25/2017   K 4.1 07/25/2017   CL 105 07/25/2017   CO2 25 07/25/2017   GLUCOSE 76 07/25/2017   BUN 16 07/25/2017   CREATININE 1.06 (H) 07/25/2017   CALCIUM 9.3 07/25/2017    Studies/Results: Dg Lumbar Spine 2-3 Views  Result Date: 07/31/2017 CLINICAL DATA:  Localization for lumbar laminectomy and decompression. EXAM: LUMBAR SPINE - 2-3 VIEW; DG C-ARM 61-120 MIN COMPARISON:  None. FINDINGS: Intraoperative imaging demonstrates posterior localization of what appears to be the L4-5 and L5-S1 levels. IMPRESSION: Intraoperative lumbar localization during lumbar surgery. Electronically Signed   By: Aletta Edouard M.D.   On: 07/31/2017 16:17   Dg C-arm 1-60 Min  Result Date: 07/31/2017 CLINICAL DATA:  Localization for lumbar laminectomy and decompression. EXAM: LUMBAR SPINE - 2-3 VIEW; DG C-ARM 61-120 MIN COMPARISON:  None. FINDINGS:  Intraoperative imaging demonstrates posterior localization of what appears to be the L4-5 and L5-S1 levels. IMPRESSION: Intraoperative lumbar localization during lumbar surgery. Electronically Signed   By: Aletta Edouard M.D.   On: 07/31/2017 16:17    Assessment/Plan: Continue flat BR for 3 days   LOS: 1 day    Ailen Strauch S 08/01/2017, 9:18 AM

## 2017-08-01 NOTE — Care Management Note (Signed)
Case Management Note  Patient Details  Name: Linda Mcgrath MRN: [id number] Date of Birth: [birth year]  Subjective/Objective:   Pt underwent:  DECOMPRESSIVE LAMINECTOMY LUMABR FOUR-FIVE, LUMBAR FIVE-SACRAL ONE. She is from home with family.                  Action/Plan: Pt currently on 72 hours of bedrest. Once complete await PT/OT evals. CM following. CM will f/u on patients need for ordered DME.  Expected Discharge Date:                  Expected Discharge Plan:     In-House Referral:     Discharge planning Services  CM Consult  Post Acute Care Choice:  Durable Medical Equipment Choice offered to:     DME Arranged:    DME Agency:     HH Arranged:    [place] Agency:     Status of Service:  In process, will continue to follow  If discussed at Long Length of Stay Meetings, dates discussed:    Additional Comments:  Pollie Friar, RN 08/01/2017, 1:43 PM

## 2017-08-02 NOTE — Progress Notes (Signed)
Subjective: Patient reports mild headache this morning and back soreness but feels that pain is better than yesterday.   Objective: Vital signs in last 24 hours: Temp:  [98.1 F (36.7 C)-98.5 F (36.9 C)] 98.3 F (36.8 C) (12/08 0500) Pulse Rate:  [72-87] 72 (12/08 0500) Resp:  [18-19] 18 (12/08 0500) BP: (142-158)/(58-74) 158/74 (12/08 0500) SpO2:  [95 %-98 %] 96 % (12/08 0500)  Intake/Output from previous day: 12/07 0701 - 12/08 0700 In: 1728 [P.O.:480; I.V.:1248] Out: 3500 [Urine:3500] Intake/Output this shift: No intake/output data recorded.  Neurologic: Grossly normal  Lab Results: Lab Results  Component Value Date   WBC 8.7 07/25/2017   HGB 15.7 (H) 07/25/2017   HCT 45.8 07/25/2017   MCV 93.9 07/25/2017   PLT 240 07/25/2017   Lab Results  Component Value Date   INR 1.04 07/25/2017   BMET Lab Results  Component Value Date   NA 138 07/25/2017   K 4.1 07/25/2017   CL 105 07/25/2017   CO2 25 07/25/2017   GLUCOSE 76 07/25/2017   BUN 16 07/25/2017   CREATININE 1.06 (H) 07/25/2017   CALCIUM 9.3 07/25/2017    Studies/Results: Dg Lumbar Spine 2-3 Views  Result Date: 07/31/2017 CLINICAL DATA:  Localization for lumbar laminectomy and decompression. EXAM: LUMBAR SPINE - 2-3 VIEW; DG C-ARM 61-120 MIN COMPARISON:  None. FINDINGS: Intraoperative imaging demonstrates posterior localization of what appears to be the L4-5 and L5-S1 levels. IMPRESSION: Intraoperative lumbar localization during lumbar surgery. Electronically Signed   By: Aletta Edouard M.D.   On: 07/31/2017 16:17   Dg C-arm 1-60 Min  Result Date: 07/31/2017 CLINICAL DATA:  Localization for lumbar laminectomy and decompression. EXAM: LUMBAR SPINE - 2-3 VIEW; DG C-ARM 61-120 MIN COMPARISON:  None. FINDINGS: Intraoperative imaging demonstrates posterior localization of what appears to be the L4-5 and L5-S1 levels. IMPRESSION: Intraoperative lumbar localization during lumbar surgery. Electronically Signed   By:  Aletta Edouard M.D.   On: 07/31/2017 16:17    Assessment/Plan: Doing well. Continue to lay flat but may lay on side for comfort. No new nsgy recom.    LOS: 2 days    Ocie Cornfield [facility] 08/02/2017, 9:42 AM

## 2017-08-03 NOTE — Progress Notes (Signed)
Subjective: Patient reports patient doing well however condition of back pain and some cramping in her legs no new numbness or tingling pain is slightly better than preop  Objective: Vital signs in last 24 hours: Temp:  [97.7 F (36.5 C)-99.4 F (37.4 C)] 98 F (36.7 C) (12/09 0530) Pulse Rate:  [73-100] 86 (12/09 0530) Resp:  [17-20] 20 (12/09 0530) BP: (109-130)/(52-61) 119/55 (12/09 0530) SpO2:  [96 %-99 %] 98 % (12/09 0530)  Intake/Output from previous day: 12/08 0701 - 12/09 0700 In: 3 [I.V.:3] Out: 1550 [Urine:1550] Intake/Output this shift: Total I/O In: 3 [I.V.:3] Out: 900 [Urine:900]  strength 5 out of 5 although somewhat pain limited incision clean dry and intact  Lab Results: No results for input(s): WBC, HGB, HCT, PLT in the last 72 hours. BMET No results for input(s): NA, K, CL, CO2, GLUCOSE, BUN, CREATININE, CALCIUM in the last 72 hours.  Studies/Results: No results found.  Assessment/Plan: Continue to mobilize today with physical occupational therapy postop day 2 posterior lumbar interbody fusion  LOS: 3 days     Arcadio Cope P 08/03/2017, 6:42 AM

## 2017-08-04 MED ORDER — OXYCODONE HCL 10 MG PO TABS: 5.0000 mg | ORAL_TABLET | Freq: Four times a day (QID) | ORAL | 0 refills | Status: DC | PRN

## 2017-08-04 NOTE — Progress Notes (Signed)
Physical Therapy Evaluation Patient Details Name: Linda Mcgrath MRN: [id number] DOB: 04-21-1933 Today's Date: 08/04/2017   History of Present Illness  Patient is a 83 y/o female who presents s/p L4-5, L5-S1 laminectomy. PMH includes fibromyalgia, HLD.  Clinical Impression  Patient presents with pain and post surgical deficits s/p above surgery. Tolerated gait training with Min guard-Min A for balance/safety. Pt very slow to mobilize and requires repetition of cues for sequencing and hand placement as well as directional cues. Reports feeling woozy and weak as pt has been on bedrest for the past 5 days. BP stable. Pt has support from son at home. Discussed having son bring w/c to help patient get to front door from car (due to bad weather conditions) as well as having hands on assist for standing and ambulation for the next few days due to weakness. Education re: back precautions, positioning etc. Will follow acutely to maximize independence and mobility prior to return home.     Follow Up Recommendations Home health PT;Supervision for mobility/OOB;Supervision/Assistance - 24 hour    Equipment Recommendations  None recommended by PT    Recommendations for Other Services OT consult     Precautions / Restrictions Precautions Precautions: Fall;Back Precaution Booklet Issued: No Precaution Comments: Reviewed back precautions. Restrictions Weight Bearing Restrictions: No      Mobility  Bed Mobility               General bed mobility comments: Sitting in chair upon PT arrival.   Transfers Overall transfer level: Needs assistance Equipment used: Rolling walker (2 wheeled) Transfers: Stand Pivot Transfers Sit to Stand: Min assist;Mod assist Stand pivot transfers: Min assist       General transfer comment: Assist to power to standing with cues for hand placement/technique. Cues for anterior weight shift, very slow to rise with repetition needed for technique. Mod A  progressing to min A on second stand. SPT bed to chair with Min A and directional cues, "turn RW."  Ambulation/Gait Ambulation/Gait assistance: Min assist;Min guard Ambulation Distance (Feet): 20 Feet Assistive device: Rolling walker (2 wheeled) Gait Pattern/deviations: Step-through pattern;Step-to pattern;Decreased step length - left;Decreased step length - right;Shuffle;Narrow base of support Gait velocity: decreased   General Gait Details: very slow, unsteady gait with short shuffling steps and cues for RW management.   Stairs            Wheelchair Mobility    Modified Rankin (Stroke Patients Only)       Balance Overall balance assessment: Needs assistance Sitting-balance support: Feet supported;No upper extremity supported Sitting balance-Leahy Scale: Fair     Standing balance support: During functional activity;Bilateral upper extremity supported Standing balance-Leahy Scale: Poor Standing balance comment: Reliant on BUEs for support in standing. Able to take 1 UE of RW to fix hair without LOB.                             Pertinent Vitals/Pain Pain Assessment: 0-10 Pain Score: 5  Pain Location: back Pain Descriptors / Indicators: Sore;Operative site guarding Pain Intervention(s): Monitored during session;Repositioned;Limited activity within patient's tolerance    Home Living Family/patient expects to be discharged to:: Private residence Living Arrangements: Children Available Help at Discharge: Family;Available 24 hours/day Type of Home: House Home Access: Stairs to enter Entrance Stairs-Rails: Right Entrance Stairs-Number of Steps: 1 Home Layout: One level Home Equipment: Walker - 2 wheels;Cane - single point;Bedside commode;Wheelchair - manual;Shower seat - built in  Prior Function Level of Independence: Independent with assistive device(s)         Comments: Drives. Uses SPC as needed PTA.      Hand Dominance   Dominant Hand:  Right    Extremity/Trunk Assessment   Upper Extremity Assessment Upper Extremity Assessment: Defer to OT evaluation    Lower Extremity Assessment Lower Extremity Assessment: Generalized weakness    Cervical / Trunk Assessment Cervical / Trunk Assessment: Other exceptions Cervical / Trunk Exceptions: s/p spine surgery  Communication   Communication: No difficulties  Cognition Arousal/Alertness: Awake/alert Behavior During Therapy: WFL for tasks assessed/performed Overall Cognitive Status: Within Functional Limits for tasks assessed                                 General Comments: For basic mobility tasks but seems to require step by step cues for sequencing and directional cues. Very slow.      General Comments General comments (skin integrity, edema, etc.): BP pre activity 118/63, post activity 121/68.    Exercises     Assessment/Plan    PT Assessment Patient needs continued PT services  PT Problem List Decreased strength;Decreased mobility;Pain;Decreased balance;Decreased activity tolerance;Decreased cognition       PT Treatment Interventions DME instruction;Functional mobility training;Balance training;Patient/family education;Gait training;Therapeutic activities;Neuromuscular re-education;Therapeutic exercise    PT Goals (Current goals can be found in the Care Plan section)  Acute Rehab PT Goals Patient Stated Goal: to get stronger and be able to get around my house PT Goal Formulation: With patient Time For Goal Achievement: 08/18/17 Potential to Achieve Goals: Good    Frequency Min 5X/week   Barriers to discharge        Co-evaluation               AM-PAC PT "6 Clicks" Daily Activity  Outcome Measure Difficulty turning over in bed (including adjusting bedclothes, sheets and blankets)?: None Difficulty moving from lying on back to sitting on the side of the bed? : Unable Difficulty sitting down on and standing up from a chair with  arms (e.g., wheelchair, bedside commode, etc,.)?: Unable Help needed moving to and from a bed to chair (including a wheelchair)?: A Little Help needed walking in hospital room?: A Little Help needed climbing 3-5 steps with a railing? : A Lot 6 Click Score: 14    End of Session Equipment Utilized During Treatment: Gait belt Activity Tolerance: Patient tolerated treatment well;Patient limited by fatigue Patient left: in chair;with call bell/phone within reach;with family/visitor present Nurse Communication: Mobility status PT Visit Diagnosis: Pain;Difficulty in walking, not elsewhere classified (R26.2);Muscle weakness (generalized) (M62.81);Unsteadiness on feet (R26.81) Pain - part of body: (back)    Time: 8657-8469 PT Time Calculation (min) (ACUTE ONLY): 30 min   Charges:   PT Evaluation $PT Eval Low Complexity: 1 Low PT Treatments $Gait Training: 8-22 mins   PT G Codes:        Mylo Red, PT, DPT [phone]    Blake Divine A Lynette Noah 08/04/2017, 11:25 AM

## 2017-08-04 NOTE — Care Management Note (Signed)
Case Management Note  Patient Details  Name: Linda Mcgrath MRN: [id number] Date of Birth: 1933/08/12  Subjective/Objective:                    Action/Plan: Pt discharging home when able with [facility] services. CM provided her and her son with a list of [place] agencies. They selected [facility]. Cory with [facility] notified and accepted the referral.  Pt states she has all ordered DME at home.  Son to provide transportation home.  Expected Discharge Date:  08/04/17               Expected Discharge Plan:  [place]  In-House Referral:     Discharge planning Services  CM Consult  Post Acute Care Choice:  Durable Medical Equipment, Home Health Choice offered to:  Patient, Adult Children  DME Arranged:  (pt has DME at home) DME Agency:     HH Arranged:  PT, OT HH Agency:  [place]  Status of Service:  Completed, signed off  If discussed at [place] of Stay Meetings, dates discussed:    Additional Comments:  Pollie Friar, RN 08/04/2017, 12:36 PM

## 2017-08-04 NOTE — Progress Notes (Signed)
Patient had Foley D/C'd. Has not voided yet. Bladder Scan indicates only 20cc. No urge to urinate. Walked to restroom with no success. Will encourage fluids and recheck.

## 2017-08-04 NOTE — Discharge Summary (Signed)
Physician Discharge Summary  Patient ID: Linda Mcgrath MRN: [id number] DOB/AGE: November 19, 1932 83 y.o.  Admit date: 07/31/2017 Discharge date: 08/04/2017  Admission Diagnoses: stenosis    Discharge Diagnoses: same   Discharged Condition: good  Hospital Course: The patient was admitted on 07/31/2017 and taken to the operating room where the patient underwent DLL/ fusion. The patient tolerated the procedure well and was taken to the recovery room and then to the floor in stable condition. She remained flat BR for 3 days for unintended durotomy. Otherwise, The hospital course was routine. There were no complications. The wound remained clean dry and intact. Pt had appropriate back soreness. No complaints of leg pain or new N/T/W. The patient remained afebrile with stable vital signs, and tolerated a regular diet. The patient continued to increase activities, and pain was well controlled with oral pain medications.   Consults: None  Significant Diagnostic Studies:  Results for orders placed or performed during the hospital encounter of 07/25/17  Surgical pcr screen  Result Value Ref Range   MRSA, PCR NEGATIVE NEGATIVE   Staphylococcus aureus NEGATIVE NEGATIVE  Basic metabolic panel  Result Value Ref Range   Sodium 138 135 - 145 mmol/L   Potassium 4.1 3.5 - 5.1 mmol/L   Chloride 105 101 - 111 mmol/L   CO2 25 22 - 32 mmol/L   Glucose, Bld 76 65 - 99 mg/dL   BUN 16 6 - 20 mg/dL   Creatinine, Ser 4.09 (H) 0.44 - 1.00 mg/dL   Calcium 9.3 8.9 - 81.1 mg/dL   GFR calc non Af Amer 48 (L) >60 mL/min   GFR calc Af Amer 55 (L) >60 mL/min   Anion gap 8 5 - 15  CBC WITH DIFFERENTIAL  Result Value Ref Range   WBC 8.7 4.0 - 10.5 K/uL   RBC 4.88 3.87 - 5.11 MIL/uL   Hemoglobin 15.7 (H) 12.0 - 15.0 g/dL   HCT 91.4 78.2 - 95.6 %   MCV 93.9 78.0 - 100.0 fL   MCH 32.2 26.0 - 34.0 pg   MCHC 34.3 30.0 - 36.0 g/dL   RDW 21.3 08.6 - 57.8 %   Platelets 240 150 - 400 K/uL   Neutrophils  Relative % 44 %   Neutro Abs 3.9 1.7 - 7.7 K/uL   Lymphocytes Relative 43 %   Lymphs Abs 3.7 0.7 - 4.0 K/uL   Monocytes Relative 9 %   Monocytes Absolute 0.8 0.1 - 1.0 K/uL   Eosinophils Relative 3 %   Eosinophils Absolute 0.2 0.0 - 0.7 K/uL   Basophils Relative 1 %   Basophils Absolute 0.0 0.0 - 0.1 K/uL  Protime-INR  Result Value Ref Range   Prothrombin Time 13.5 11.4 - 15.2 seconds   INR 1.04   Type and screen [facility]  Result Value Ref Range   ABO/RH(D) B NEG    Antibody Screen NEG    Sample Expiration 08/08/2017    Extend sample reason NO TRANSFUSIONS OR PREGNANCY IN THE PAST 3 MONTHS   ABO/Rh  Result Value Ref Range   ABO/RH(D) B NEG     Chest 2 View  Result Date: 07/25/2017 CLINICAL DATA:  Preop chest x-ray. Scheduled posterior spinal fusion. EXAM: CHEST  2 VIEW COMPARISON:  Chest x-ray 03/17/2016, CT chest 03/06/2016 FINDINGS: There is lingular airspace disease likely reflecting atelectasis versus scarring. There is a calcified left upper lobe pulmonary nodule consistent with prior granulomatous disease. There is no focal parenchymal opacity. There is no  pleural effusion or pneumothorax. The heart and mediastinal contours are unremarkable. The osseous structures are unremarkable. IMPRESSION: No active cardiopulmonary disease. Electronically Signed   By: Elige Ko   On: 07/25/2017 16:19   Dg Lumbar Spine 2-3 Views  Result Date: 07/31/2017 CLINICAL DATA:  Localization for lumbar laminectomy and decompression. EXAM: LUMBAR SPINE - 2-3 VIEW; DG C-ARM 61-120 MIN COMPARISON:  None. FINDINGS: Intraoperative imaging demonstrates posterior localization of what appears to be the L4-5 and L5-S1 levels. IMPRESSION: Intraoperative lumbar localization during lumbar surgery. Electronically Signed   By: Irish Lack M.D.   On: 07/31/2017 16:17   Dg C-arm 1-60 Min  Result Date: 07/31/2017 CLINICAL DATA:  Localization for lumbar laminectomy and decompression. EXAM:  LUMBAR SPINE - 2-3 VIEW; DG C-ARM 61-120 MIN COMPARISON:  None. FINDINGS: Intraoperative imaging demonstrates posterior localization of what appears to be the L4-5 and L5-S1 levels. IMPRESSION: Intraoperative lumbar localization during lumbar surgery. Electronically Signed   By: Irish Lack M.D.   On: 07/31/2017 16:17    Antibiotics:  Anti-infectives (From admission, onward)   Start     Dose/Rate Route Frequency Ordered Stop   08/01/17 0130  vancomycin (VANCOCIN) IVPB 1000 mg/200 mL premix     1,000 mg 200 mL/hr over 60 Minutes Intravenous  Once 07/31/17 1854 08/01/17 0252   07/31/17 1541  vancomycin (VANCOCIN) powder  Status:  Discontinued       As needed 07/31/17 1541 07/31/17 1608   07/31/17 1443  bacitracin 50,000 Units in sodium chloride irrigation 0.9 % 500 mL irrigation  Status:  Discontinued       As needed 07/31/17 1443 07/31/17 1608   07/31/17 1200  vancomycin (VANCOCIN) 1,250 mg in sodium chloride 0.9 % 250 mL IVPB  Status:  Discontinued     1,250 mg 166.7 mL/hr over 90 Minutes Intravenous To Surgery 07/30/17 1028 07/31/17 1819      Discharge Exam: Blood pressure 136/69, pulse 98, temperature 98.3 F (36.8 C), temperature source Oral, resp. rate 18, height 5\' 4"  (1.626 m), weight 81.7 kg (180 lb 3.2 oz), last menstrual period 08/26/1969, SpO2 98 %. Neurologic: Grossly normal Dressing dry  Discharge Medications:   Allergies as of 08/04/2017      Reactions   Shellfish Allergy Anaphylaxis, Nausea And Vomiting   Tetracycline Hives, Nausea And Vomiting, Rash, Other (See Comments)   SYSTEMIC REACTION: heart palpitations,muscle spasms, vomiting   Amlodipine Besylate Other (See Comments)   REACTION: muscle spasms, extremity swelling, low bp   Ciprofloxacin Other (See Comments)   REACTION: muscle spasms, insomnia   Codeine Other (See Comments)   REACTION: hallucinations   Propoxyphene Hcl Other (See Comments)   Unknown   Sulfamethoxazole-trimethoprim Nausea Only, Other  (See Comments)   REACTION: dizzy, could not focus eyes and could not concentrate and nausea   Tape Other (See Comments)   BANDAIDS TAKE OFF HER SKIN; PLEASE USE COBAN OR PAPER    Xarelto [rivaroxaban] Other (See Comments)   Aches and pains and nervousness   Amoxicillin Rash   Other Swelling, Rash, Other (See Comments)   Has autoimmune disorder and spices erode her salivary glands and affects ankles and mouth DIRECTLY (takes off 1st layer of skin and leaves her unable to walk)   Penicillins Swelling, Rash, Other (See Comments)   Has patient had a PCN reaction causing immediate rash, facial/tongue/throat swelling, SOB or lightheadedness with hypotension: Yes Has patient had a PCN reaction causing severe rash involving mucus membranes or skin necrosis: No Has patient  had a PCN reaction that required hospitalization No Has patient had a PCN reaction occurring within the last 10 years: No If all of the above answers are "NO", then may proceed with Cephalosporin use.      Medication List    TAKE these medications   aspirin 81 MG tablet Take 81 mg by mouth daily.   AZO-CRANBERRY PO Take 1 capsule by mouth 2 (two) times daily.   Biotin 10 MG Tabs Take 1 tablet by mouth every morning.   CALCIUM CITRATE + D 250-200 MG-UNIT Tabs Generic drug:  Calcium Citrate-Vitamin D Take 1 tablet by mouth daily.   cholecalciferol 1000 units tablet Commonly known as:  VITAMIN D Take 1,000 Units by mouth daily.   cyclobenzaprine 10 MG tablet Commonly known as:  FLEXERIL TAKE 1 TABLET (10 MG TOTAL) BY MOUTH AT BEDTIME AS NEEDED. What changed:  when to take this   estradiol 1 MG tablet Commonly known as:  ESTRACE TAKE 1 TABLET (1 MG TOTAL) BY MOUTH DAILY.   fluticasone 50 MCG/ACT nasal spray Commonly known as:  FLONASE Place 2 sprays into both nostrils daily as needed for allergies.   folic acid 1 MG tablet Commonly known as:  FOLVITE Take 1 mg by mouth daily.   ibuprofen 200 MG  tablet Commonly known as:  ADVIL,MOTRIN Take 400 mg by mouth at bedtime. For leg pain/discomfort   levothyroxine 88 MCG tablet Commonly known as:  SYNTHROID, LEVOTHROID Take 1 tablet (88 mcg total) by mouth daily with breakfast.   loratadine 10 MG tablet Commonly known as:  CLARITIN Take 10 mg by mouth daily as needed for allergies.   MAGNESIUM-OXIDE PO Take 1 tablet by mouth daily.   montelukast 10 MG tablet Commonly known as:  SINGULAIR Take 1 tablet (10 mg total) by mouth at bedtime.   MUCINEX ALLERGY PO Take 1 tablet by mouth daily as needed (Allergies).   multivitamin capsule Take 1 capsule by mouth daily.   Oxycodone HCl 10 MG Tabs Take 0.5 tablets (5 mg total) by mouth every 6 (six) hours as needed for severe pain ((score 7 to 10)).   TURMERIC PO Take 1 capsule by mouth 2 (two) times daily.   VITAMIN B COMPLEX PO Take 1 tablet by mouth daily.   vitamin B-12 500 MCG tablet Commonly known as:  CYANOCOBALAMIN Take 500 mcg by mouth daily.   vitamin C 1000 MG tablet Take 1,000 mg by mouth daily.            Durable Medical Equipment  (From admission, onward)        Start     Ordered   07/31/17 1837  DME Walker rolling  Once    Question:  Patient needs a walker to treat with the following condition  Answer:  S/P lumbar fusion   07/31/17 1836   07/31/17 1837  DME 3 n 1  Once     07/31/17 1836      Disposition: home   Final Dx: lumbar decompresdsion  Discharge Instructions    Call MD for:  difficulty breathing, headache or visual disturbances   Complete by:  As directed    Call MD for:  persistant nausea and vomiting   Complete by:  As directed    Call MD for:  redness, tenderness, or signs of infection (pain, swelling, redness, odor or green/yellow discharge around incision site)   Complete by:  As directed    Call MD for:  severe uncontrolled pain  Complete by:  As directed    Call MD for:  temperature >100.4   Complete by:  As directed     Diet - low sodium heart healthy   Complete by:  As directed    Increase activity slowly   Complete by:  As directed    Remove dressing in 24 hours   Complete by:  As directed       Follow-up Information    Tia Alert, MD. Schedule an appointment as soon as possible for a visit in 2 week(s).   Specialty:  Neurosurgery Contact information: [address] [place] [address] [phone]            Signed: Tia Alert 08/04/2017, 10:34 AM

## 2017-08-04 NOTE — Progress Notes (Signed)
OT Evaluation  PTA, pt lived with he son and was independent with ADl and mobility. Per son, they assisted her PTA due to pain. Pt currently requires Mod A with LB ADL and min A with functional mobility with ADL. Pt appears generally weak after 3 days of bedrest. Recommend follow up [place] to facilitate return to PLOF and reduce risk of falls. Will follow acutely.   08/04/17 1215  OT Visit Information  Last OT Received On 08/04/17  Assistance Needed +1  History of Present Illness Patient is a 83 y/o female who presents s/p L4-5, L5-S1 laminectomy. She remained flat BR for 3 days for unintended durotomy. PMH includes fibromyalgia, HLD.  Precautions  Precautions Fall;Back  Precaution Booklet Issued No  Precaution Comments Reviewed back precautions.  Restrictions  Weight Bearing Restrictions No  Home Living  Family/patient expects to be discharged to: Private residence  Living Arrangements Children  Available Help at Discharge Family;Available 24 hours/day  Type of Home House  Home Access Stairs to enter  Entrance Stairs-Number of Steps 1  Entrance Stairs-Rails Right  Home Layout One level  Bathroom Shower/Tub Walk-in shower  Bathroom Toilet Handicapped height  Bathroom Accessibility Yes  How Accessible Accessible via walker  [place] - 2 wheels;Cane - single point;BSC;Wheelchair - manual;Shower seat;Toilet riser;Adaptive equipment  Adaptive Equipment Reacher;Sock aid  Prior Function  Level of Independence Independent with assistive device(s)  Comments Drives. Uses SPC as needed PTA.   Communication  Communication No difficulties  Pain Assessment  Pain Assessment Faces  Faces Pain Scale 4  Pain Location back  Pain Descriptors / Indicators Sore;Operative site guarding  Pain Intervention(s) Limited activity within patient's tolerance  Cognition  Arousal/Alertness Awake/alert  Behavior During Therapy WFL for tasks assessed/performed  Overall Cognitive Status Within  Functional Limits for tasks assessed  General Comments For basic mobility tasks but seems to require step by step cues for sequencing and directional cues. Very slow.  Upper Extremity Assessment  Upper Extremity Assessment Overall WFL for tasks assessed  Lower Extremity Assessment  Lower Extremity Assessment Generalized weakness  Cervical / Trunk Assessment  Cervical / Trunk Assessment Other exceptions  Cervical / Trunk Exceptions s/p spine surgery  ADL  Overall ADL's  Needs assistance/impaired  Grooming Supervision/safety  Upper Body Bathing Set up;Sitting  Lower Body Bathing Moderate assistance;Sit to/from stand  Upper Body Dressing  Set up;Supervision/safety;Sitting  Lower Body Dressing Moderate assistance;Sit to/from stand  Lower Body Dressing Details (indicate cue type and reason) unable to cross feet over knees; has AE at home  Toilet Transfer Minimal assistance;RW;Ambulation;BSC (over toilet)  Toileting- Clothing Manipulation and Hygiene Moderate assistance;Sitting/lateral lean  Functional mobility during ADLs Minimal assistance;Rolling walker;Cueing for safety;Cueing for sequencing  General ADL Comments very slow with ambulation. Began education on compensatory techniques for ADL; son will be able to assist as needed  Vision- History  Baseline Vision/History Wears glasses  Bed Mobility  General bed mobility comments Sitting in chair upon PT arrival.   Transfers  Overall transfer level Needs assistance  Equipment used Rolling walker (2 wheeled)  Sit to Stand Min assist  Balance  Overall balance assessment Needs assistance  Sitting-balance support Feet supported;No upper extremity supported  Sitting balance-Leahy Scale Fair  Standing balance support During functional activity;Bilateral upper extremity supported  Standing balance-Leahy Scale Poor  Standing balance comment Reliant on BUEs for support in standing. Able to take 1 UE of RW to fix hair without LOB.  OT - End of  Session  Equipment Utilized  During Treatment Gait belt  Activity Tolerance Patient tolerated treatment well  Patient left Other (comment);with family/visitor present (in bathroom with family member)  Nurse Communication Mobility status;Precautions  OT Assessment  OT Recommendation/Assessment Patient needs continued OT Services  OT Visit Diagnosis Unsteadiness on feet (R26.81);Pain;Muscle weakness (generalized) (M62.81)  Pain - part of body (back)  OT Problem List Decreased strength;Decreased activity tolerance;Impaired balance (sitting and/or standing);Decreased knowledge of use of DME or AE;Decreased safety awareness;Obesity;Pain  OT Plan  OT Frequency (ACUTE ONLY) Min 2X/week  OT Treatment/Interventions (ACUTE ONLY) Self-care/ADL training;DME and/or AE instruction;Therapeutic activities;Patient/family education  AM-PAC OT "6 Clicks" Daily Activity Outcome Measure  Help from another person eating meals? 4  Help from another person taking care of personal grooming? 3  Help from another person toileting, which includes using toliet, bedpan, or urinal? 3  Help from another person bathing (including washing, rinsing, drying)? 3  Help from another person to put on and taking off regular upper body clothing? 3  Help from another person to put on and taking off regular lower body clothing? 2  6 Click Score 18  ADL G Code Conversion CK  OT Recommendation  Follow Up Recommendations Home health OT;Supervision/Assistance - 24 hour  OT Equipment None recommended by OT  Individuals Consulted  Consulted and Agree with Results and Recommendations Patient;Family member/caregiver  Family Member Consulted son  Acute Rehab OT Goals  Patient Stated Goal to get stronger and be able to get around my house  OT Goal Formulation With patient  Time For Goal Achievement 08/18/17  Potential to Achieve Goals Good  OT Time Calculation  OT Start Time (ACUTE ONLY) 1130  OT Stop Time (ACUTE ONLY) 1200  OT Time  Calculation (min) 30 min  OT General Charges  $OT Visit 1 Visit  OT Evaluation  $OT Eval Low Complexity 1 Low  OT Treatments  $Self Care/Home Management  8-22 mins  Written Expression  Dominant Hand Right  [facility], OT/L  [phone] 08/04/2017

## 2017-08-05 NOTE — Progress Notes (Signed)
Patient ID: Linda Mcgrath, female   DOB: Jul 25, 1933, 83 y.o.   MRN: [id number] Looks good today, home today.

## 2017-08-05 NOTE — Care Management Note (Signed)
Case Management Note  Patient Details  Name: Linda Mcgrath MRN: [id number] Date of Birth: 1932/11/18  Subjective/Objective:                    Action/Plan: Pt discharging home with [facility] Pt asking to have [facility] transport her home. CM spoke to bedside RN and patients son, Lex, and all in agreement for 11:30 via [facility]. CM called and arranged pickup for 11:30 with [facility]. Transport form on front of chart.   Expected Discharge Date:  08/04/17               Expected Discharge Plan:  [place]  In-House Referral:     Discharge planning Services  CM Consult  Post Acute Care Choice:  Durable Medical Equipment, Home Health Choice offered to:  Patient, Adult Children  DME Arranged:  (pt has DME at home) DME Agency:     HH Arranged:  PT, OT HH Agency:  [place]  Status of Service:  Completed, signed off  If discussed at [place] of Stay Meetings, dates discussed:    Additional Comments:  Pollie Friar, RN 08/05/2017, 10:28 AM

## 2017-08-05 NOTE — Progress Notes (Signed)
Physical Therapy Treatment Patient Details Name: Linda Mcgrath MRN: [id number] DOB: [birth year] Today's Date: 08/05/2017    History of Present Illness Patient is a 83 y/o female who presents s/p L4-5, L5-S1 laminectomy. She remained flat BR for 3 days for unintended durotomy. PMH includes fibromyalgia, HLD.    PT Comments    Patient progressing well towards PT goals. Seems more alert today. Continues to require repetition of cues to perform mobility tasks. Reporting 10/10 pain on back and LEs. Requires assist for bed mobility and transfers. Reviewed back precautions as pt only able to recall 1/3. Pt plans to d/c home with support of son. Encouraged using w/c to transport pt to front door and bump up step to get into home. Encouraged mobility. Will follow.    Follow Up Recommendations  Home health PT;Supervision for mobility/OOB;Supervision/Assistance - 24 hour     Equipment Recommendations  None recommended by PT    Recommendations for Other Services       Precautions / Restrictions Precautions Precautions: Fall;Back Precaution Booklet Issued: No Precaution Comments: Reviewed back precautions. Restrictions Weight Bearing Restrictions: No    Mobility  Bed Mobility Overal bed mobility: Needs Assistance Bed Mobility: Rolling;Sidelying to Sit Rolling: Min guard Sidelying to sit: Min assist;HOB elevated       General bed mobility comments: step by step cues to perform log roll technique, assist to elevate trunk to get to EOB. No dizziness. Use of rail.   Transfers Overall transfer level: Needs assistance Equipment used: Rolling walker (2 wheeled) Transfers: Sit to/from Stand Sit to Stand: Min assist         General transfer comment: Assist to power to standing with cues for hand placement/technique. Stood from [employer], from toilet x1. transferred to chair post ambulation.   Ambulation/Gait Ambulation/Gait assistance: Min guard Ambulation Distance (Feet): 20  Feet(+ 50') Assistive device: Rolling walker (2 wheeled) Gait Pattern/deviations: Step-through pattern;Decreased step length - left;Decreased step length - right;Shuffle;Narrow base of support;Decreased stride length;Trunk flexed Gait velocity: decreased   General Gait Details: Slow, mildly unsteady gait with cues for RW proximity and upright posture as pt tends to flex trunk esp when fatigued.    Stairs            Wheelchair Mobility    Modified Rankin (Stroke Patients Only)       Balance Overall balance assessment: Needs assistance Sitting-balance support: Feet supported;Single extremity supported Sitting balance-Leahy Scale: Fair Sitting balance - Comments: Able to perform pericare sitting on toilet with 1 UE support.   Standing balance support: During functional activity;Bilateral upper extremity supported Standing balance-Leahy Scale: Poor Standing balance comment: Requires BUE support for gait but able to wash hands at sink without UE support for short period.                            Cognition Arousal/Alertness: Awake/alert Behavior During Therapy: WFL for tasks assessed/performed Overall Cognitive Status: Impaired/Different from baseline Area of Impairment: Memory;Following commands                     Memory: Decreased short-term memory;Decreased recall of precautions Following Commands: Follows multi-step commands with increased time       General Comments: Requires repetition of cues to perform tasks. Distracted by pain today.      Exercises      General Comments General comments (skin integrity, edema, etc.): Cousin present during session. Pt saturated in urine upon PT  arrival.       Pertinent Vitals/Pain Pain Assessment: 0-10 Pain Score: 10-Worst pain ever Pain Location: back; LEs Pain Descriptors / Indicators: Sore;Operative site guarding Pain Intervention(s): Monitored during session;Repositioned;Limited activity within  patient's tolerance;Patient requesting pain meds-RN notified    Home Living                      Prior Function            PT Goals (current goals can now be found in the care plan section) Progress towards PT goals: Progressing toward goals    Frequency    Min 5X/week      PT Plan Current plan remains appropriate    Co-evaluation              AM-PAC PT "6 Clicks" Daily Activity  Outcome Measure  Difficulty turning over in bed (including adjusting bedclothes, sheets and blankets)?: None Difficulty moving from lying on back to sitting on the side of the bed? : Unable Difficulty sitting down on and standing up from a chair with arms (e.g., wheelchair, bedside commode, etc,.)?: Unable Help needed moving to and from a bed to chair (including a wheelchair)?: A Little Help needed walking in hospital room?: A Little Help needed climbing 3-5 steps with a railing? : A Lot 6 Click Score: 14    End of Session Equipment Utilized During Treatment: Gait belt Activity Tolerance: Patient limited by pain Patient left: in chair;with call bell/phone within reach;with family/visitor present Nurse Communication: Mobility status PT Visit Diagnosis: Pain;Difficulty in walking, not elsewhere classified (R26.2);Muscle weakness (generalized) (M62.81);Unsteadiness on feet (R26.81) Pain - Right/Left: (bilateral) Pain - part of body: Leg(back, )     Time: 6440-3474 PT Time Calculation (min) (ACUTE ONLY): 25 min  Charges:  $Gait Training: 8-22 mins $Therapeutic Activity: 8-22 mins                    G Codes:       Mylo Red, PT, DPT [phone]     Blake Divine A Giordana Weinheimer 08/05/2017, 9:47 AM

## 2017-08-05 NOTE — Progress Notes (Signed)
OT Treatment Note  Pt making steady progress. Completed acute education regarding compensatory techniques and use of AE for ADL. Pt encouraged to ambulate with staff. Feel pt will benefit form HHOT. All further OT to be addressed by [place].     08/05/17 0947  OT Visit Information  Last OT Received On 08/05/17  Assistance Needed +1  History of Present Illness Patient is a 83 y/o female who presents s/p L4-5, L5-S1 laminectomy. She remained flat BR for 3 days for unintended durotomy. PMH includes fibromyalgia, HLD.  Precautions  Precautions Fall;Back  Precaution Booklet Issued Yes (comment)  Precaution Comments Reviewed back precautions using handout  Pain Assessment  Pain Assessment Faces  Faces Pain Scale 4  Pain Location back; LEs  Pain Descriptors / Indicators Sore;Operative site guarding  Pain Intervention(s) Limited activity within patient's tolerance;Repositioned  Cognition  Arousal/Alertness Awake/alert  Behavior During Therapy WFL for tasks assessed/performed  Overall Cognitive Status History of cognitive impairments - at baseline  ADL  Overall ADL's  Needs assistance/impaired  Functional mobility during ADLs Min guard;Rolling walker;Cueing for safety  General ADL Comments Educated pt on use of AE for toileting. Pt verbalized understanding. Reviewed handout with pt regaridng ADL tasks.   Bed Mobility  Overal bed mobility Needs Assistance  Bed Mobility Sit to Sidelying  Sit to sidelying Min assist (to lift RLE onto bed)  Balance  Overall balance assessment Needs assistance  Sitting balance-Leahy Scale Good  Standing balance-Leahy Scale Poor  Standing balance comment reliant on RW  OT - End of Session  Equipment Utilized During Treatment Gait belt;Rolling walker  Activity Tolerance Patient tolerated treatment well  Patient left in bed;with call bell/phone within reach;with bed alarm set  Nurse Communication Mobility status  OT Assessment/Plan  OT Plan Discharge plan  remains appropriate  OT Visit Diagnosis Unsteadiness on feet (R26.81);Pain;Muscle weakness (generalized) (M62.81)  Pain - part of body (back)  OT Frequency (ACUTE ONLY) Min 2X/week  Follow Up Recommendations Home health OT;Supervision/Assistance - 24 hour  OT Equipment None recommended by OT  AM-PAC OT "6 Clicks" Daily Activity Outcome Measure  Help from another person eating meals? 4  Help from another person taking care of personal grooming? 3  Help from another person toileting, which includes using toliet, bedpan, or urinal? 3  Help from another person bathing (including washing, rinsing, drying)? 3  Help from another person to put on and taking off regular upper body clothing? 3  Help from another person to put on and taking off regular lower body clothing? 2  6 Click Score 18  ADL G Code Conversion CK  OT Goal Progression  Progress towards OT goals Progressing toward goals  Acute Rehab OT Goals  Patient Stated Goal to get stronger and be able to get around my house  OT Goal Formulation With patient  Time For Goal Achievement 08/18/17  Potential to Achieve Goals Good  ADL Goals  Pt Will Perform Toileting - Clothing Manipulation and hygiene with supervision;with adaptive equipment  Pt Will Perform Lower Body Dressing with supervision;with adaptive equipment;with caregiver independent in assisting  Pt Will Transfer to Toilet with modified independence;bedside commode  Pt Will Perform Lower Body Bathing with supervision;with caregiver independent in assisting;with adaptive equipment  OT Time Calculation  OT Start Time (ACUTE ONLY) 0920  OT Stop Time (ACUTE ONLY) 0951  OT Time Calculation (min) 31 min  OT General Charges  $OT Visit 1 Visit  OT Treatments  $Self Care/Home Management  23-37 mins  [facility],  OT/L  [phone] 08/05/2017

## 2017-08-05 NOTE — Progress Notes (Signed)
Patient discharged home. Discharge instructions were reviewed with the patient. Patient Verbalized understanding.  

## 2017-08-06 DIAGNOSIS — M4326 Fusion of spine, lumbar region: Secondary | ICD-10-CM | POA: Diagnosis not present

## 2017-08-06 DIAGNOSIS — Z4789 Encounter for other orthopedic aftercare: Secondary | ICD-10-CM | POA: Diagnosis not present

## 2017-08-08 DIAGNOSIS — M4326 Fusion of spine, lumbar region: Secondary | ICD-10-CM | POA: Diagnosis not present

## 2017-08-08 DIAGNOSIS — Z4789 Encounter for other orthopedic aftercare: Secondary | ICD-10-CM | POA: Diagnosis not present

## 2017-08-11 ENCOUNTER — Telehealth: Payer: Self-pay | Admitting: Family Medicine

## 2017-08-11 NOTE — Telephone Encounter (Signed)
Copied from [place] [phone]. Topic: Quick Communication - See Telephone Encounter >> Aug 11, 2017  1:07 PM Cleaster Corin, NT wrote: CRM for notification. See Telephone encounter for:   08/11/17. Renaee from [facility] requesting Verbal order needed for pt. To have OT in home to increase independence with ADL'S at home. Renaee can be reached at [phone]

## 2017-08-11 NOTE — Telephone Encounter (Signed)
Verbal order given to Renae

## 2017-08-11 NOTE — Telephone Encounter (Signed)
Please ok that verbal order  

## 2017-08-12 DIAGNOSIS — Z4789 Encounter for other orthopedic aftercare: Secondary | ICD-10-CM | POA: Diagnosis not present

## 2017-08-12 DIAGNOSIS — M4326 Fusion of spine, lumbar region: Secondary | ICD-10-CM | POA: Diagnosis not present

## 2017-08-14 DIAGNOSIS — Z4789 Encounter for other orthopedic aftercare: Secondary | ICD-10-CM | POA: Diagnosis not present

## 2017-08-14 DIAGNOSIS — M4326 Fusion of spine, lumbar region: Secondary | ICD-10-CM | POA: Diagnosis not present

## 2017-08-18 DIAGNOSIS — Z4789 Encounter for other orthopedic aftercare: Secondary | ICD-10-CM | POA: Diagnosis not present

## 2017-08-18 DIAGNOSIS — M4326 Fusion of spine, lumbar region: Secondary | ICD-10-CM | POA: Diagnosis not present

## 2017-08-21 DIAGNOSIS — Z4789 Encounter for other orthopedic aftercare: Secondary | ICD-10-CM | POA: Diagnosis not present

## 2017-08-21 DIAGNOSIS — M4326 Fusion of spine, lumbar region: Secondary | ICD-10-CM | POA: Diagnosis not present

## 2017-08-22 ENCOUNTER — Other Ambulatory Visit (HOSPITAL_COMMUNITY): Payer: Medicare Other

## 2017-08-22 DIAGNOSIS — Z4789 Encounter for other orthopedic aftercare: Secondary | ICD-10-CM | POA: Diagnosis not present

## 2017-08-22 DIAGNOSIS — M4326 Fusion of spine, lumbar region: Secondary | ICD-10-CM | POA: Diagnosis not present

## 2017-08-27 DIAGNOSIS — M4326 Fusion of spine, lumbar region: Secondary | ICD-10-CM | POA: Diagnosis not present

## 2017-08-27 DIAGNOSIS — Z4789 Encounter for other orthopedic aftercare: Secondary | ICD-10-CM | POA: Diagnosis not present

## 2017-08-28 DIAGNOSIS — M4326 Fusion of spine, lumbar region: Secondary | ICD-10-CM | POA: Diagnosis not present

## 2017-08-28 DIAGNOSIS — Z4789 Encounter for other orthopedic aftercare: Secondary | ICD-10-CM | POA: Diagnosis not present

## 2017-08-29 DIAGNOSIS — Z4789 Encounter for other orthopedic aftercare: Secondary | ICD-10-CM | POA: Diagnosis not present

## 2017-08-29 DIAGNOSIS — M4326 Fusion of spine, lumbar region: Secondary | ICD-10-CM | POA: Diagnosis not present

## 2017-09-01 DIAGNOSIS — M4326 Fusion of spine, lumbar region: Secondary | ICD-10-CM | POA: Diagnosis not present

## 2017-09-01 DIAGNOSIS — Z4789 Encounter for other orthopedic aftercare: Secondary | ICD-10-CM | POA: Diagnosis not present

## 2017-09-04 DIAGNOSIS — M4326 Fusion of spine, lumbar region: Secondary | ICD-10-CM | POA: Diagnosis not present

## 2017-09-04 DIAGNOSIS — Z4789 Encounter for other orthopedic aftercare: Secondary | ICD-10-CM | POA: Diagnosis not present

## 2017-09-09 DIAGNOSIS — M4317 Spondylolisthesis, lumbosacral region: Secondary | ICD-10-CM | POA: Diagnosis not present

## 2017-09-09 DIAGNOSIS — Z6830 Body mass index (BMI) 30.0-30.9, adult: Secondary | ICD-10-CM | POA: Diagnosis not present

## 2017-10-03 ENCOUNTER — Ambulatory Visit (INDEPENDENT_AMBULATORY_CARE_PROVIDER_SITE_OTHER): Payer: Medicare Other | Admitting: Family Medicine

## 2017-10-03 ENCOUNTER — Encounter: Payer: Self-pay | Admitting: Family Medicine

## 2017-10-03 DIAGNOSIS — J069 Acute upper respiratory infection, unspecified: Secondary | ICD-10-CM | POA: Diagnosis not present

## 2017-10-03 MED ORDER — CLINDAMYCIN HCL 300 MG PO CAPS: 300.0000 mg | ORAL_CAPSULE | Freq: Three times a day (TID) | ORAL | 0 refills | Status: DC

## 2017-10-03 MED ORDER — FLUTICASONE PROPIONATE 50 MCG/ACT NA SUSP: 2.0000 | Freq: Every day | NASAL | 2 refills | Status: DC | PRN

## 2017-10-03 NOTE — Progress Notes (Signed)
duration of symptoms: in the last few days Rhinorrhea: not except when coughing some Congestion: yes ear pain: intermittently sore throat:yes Cough: yes, some Myalgias: no Fevers: no Wheeze: some yesterday but not today.   No vomiting, no diarrhea.   Sick contact at home.    She was able to tolerate her surgery in 07/2017.  D/w pt.  She is living with her son.  Still using her cane at baseline.  No falls.    Per HPI unless specifically indicated in ROS section   Meds, vitals, and allergies reviewed.   GEN: nad, alert and oriented HEENT: mucous membranes moist, TM w/o erythema, nasal epithelium injected, OP with cobblestoning NECK: supple w/o LA CV: rrr. PULM: ctab, no inc wob ABD: soft, +bs EXT: no edema B max sinuses ttp

## 2017-10-03 NOTE — Patient Instructions (Addendum)
Rest and fluids.  Use warm compresses for the facial pain.  Continue flonase.  If symptoms continue through the weekend, then start clindamycin.  Take care.  Glad to see you.

## 2017-10-05 DIAGNOSIS — J069 Acute upper respiratory infection, unspecified: Secondary | ICD-10-CM | POA: Insufficient documentation

## 2017-10-05 NOTE — Assessment & Plan Note (Signed)
Nontoxic.  Okay for outpatient follow-up.  She does have bilateral maxillary sinus tenderness but she is only had symptoms for a few days.  Discussed with patient about options.  This could still be viral. Rest and fluids in the meantime. Use warm compresses for the facial pain.  Continue flonase.  If symptoms continue through the weekend, then start clindamycin.  She agrees.  Update [place] as needed.

## 2017-10-27 ENCOUNTER — Other Ambulatory Visit: Payer: Self-pay | Admitting: Family Medicine

## 2017-10-27 DIAGNOSIS — Z1231 Encounter for screening mammogram for malignant neoplasm of breast: Secondary | ICD-10-CM

## 2017-11-04 ENCOUNTER — Ambulatory Visit (INDEPENDENT_AMBULATORY_CARE_PROVIDER_SITE_OTHER): Payer: Medicare Other | Admitting: Primary Care

## 2017-11-04 ENCOUNTER — Encounter: Payer: Self-pay | Admitting: Primary Care

## 2017-11-04 ENCOUNTER — Ambulatory Visit: Payer: Self-pay | Admitting: *Deleted

## 2017-11-04 VITALS — BP 126/66 | HR 85 | Temp 98.0°F | Ht 62.25 in | Wt 175.5 lb

## 2017-11-04 DIAGNOSIS — R197 Diarrhea, unspecified: Secondary | ICD-10-CM

## 2017-11-04 LAB — COMPREHENSIVE METABOLIC PANEL
ALT: 15 U/L (ref 0–35)
AST: 14 U/L (ref 0–37)
Albumin: 3.9 g/dL (ref 3.5–5.2)
Alkaline Phosphatase: 62 U/L (ref 39–117)
BUN: 8 mg/dL (ref 6–23)
CO2: 28 mEq/L (ref 19–32)
Calcium: 9.4 mg/dL (ref 8.4–10.5)
Chloride: 102 mEq/L (ref 96–112)
Creatinine, Ser: 0.94 mg/dL (ref 0.40–1.20)
GFR: 60.5 mL/min (ref >60.00)
Glucose, Bld: 93 mg/dL (ref 70–99)
Potassium: 4.2 mEq/L (ref 3.5–5.1)
Sodium: 136 mEq/L (ref 135–145)
Total Bilirubin: 0.6 mg/dL (ref 0.2–1.2)
Total Protein: 7 g/dL (ref 6.0–8.3)

## 2017-11-04 NOTE — Telephone Encounter (Signed)
Pt has appt 11/04/17 at 2:30 with Allie Bossier NP.

## 2017-11-04 NOTE — Patient Instructions (Signed)
Stop by the lab prior to leaving today. I will notify you of your results once received.   Continue to push fluids to prevent dehydration.   It was a pleasure to see you today!

## 2017-11-04 NOTE — Telephone Encounter (Signed)
Pt states watery stool x 3 days. Severe, has gone x 2 this am but 8 times over last 24 hours. States mostly "clear water but some loose form." Reports urgency with several incontinent episodes. States is staying hydrated; "I drink 6-8 ozs of water after each BM, also broth ." Denies feeling dehydrated; no fever or dizziness, denies nausea/vomiting.  States  4/10 "lower stomach cramping right before BM then goes away." Has taken Kaopectate, imodium and Pepto Bismol, "Helps a little."  Appt made for today with Jodi Mourning. Care advise given per protocol.   Reason for Disposition . [1] SEVERE diarrhea (e.g., 7 or more times / day more than normal) AND [2]  age > 60 years  Answer Assessment - Initial Assessment Questions 1. DIARRHEA SEVERITY: "How bad is the diarrhea?" "How many extra stools have you had in the past 24 hours than normal?"    - MILD: Few loose or mushy BMs; increase of 1-3 stools over normal daily number of stools; mild increase in ostomy output.   - MODERATE: Increase of 4-6 stools daily over normal; moderate increase in ostomy output.   - SEVERE (or Worst Possible): Increase of 7 or more stools daily over normal; moderate increase in ostomy output; incontinence.    8, Severe (2 this am) 2. ONSET: "When did the diarrhea begin?"      11/01/17 3. BM CONSISTENCY: "How loose or watery is the diarrhea?"      Watery, some loose form, clear, some light brown; urgency, incontinent episodes. 4. VOMITING: "Are you also vomiting?" If so, ask: "How many times in the past 24 hours?"      No, denies nausea 5. ABDOMINAL PAIN: "Are you having any abdominal pain?" If yes: "What does it feel like?" (e.g., crampy, dull, intermittent, constant)      Lower stomach, intermittent; "Right before I have to go to BR." 6. ABDOMINAL PAIN SEVERITY: If present, ask: "How bad is the pain?"  (e.g., Scale 1-10; mild, moderate, or severe)    - MILD (1-3): doesn't interfere with normal activities, abdomen soft and not  tender to touch     - MODERATE (4-7): interferes with normal activities or awakens from sleep, tender to touch     - SEVERE (8-10): excruciating pain, doubled over, unable to do any normal activities       4-5/10 7. ORAL INTAKE: If vomiting, "Have you been able to drink liquids?" "How much fluids have you had in the past 24 hours?"     n/a 8. HYDRATION: "Any signs of dehydration?" (e.g., dry mouth [not just dry lips], too weak to stand, dizziness, new weight loss) "When did you last urinate?"     "Drinking 6-8oz every time I go, also broth." 9. EXPOSURE: "Have you traveled to a foreign country recently?" "Have you been exposed to anyone with diarrhea?" "Could you have eaten any food that was spoiled?"     no 10. OTHER SYMPTOMS: "Do you have any other symptoms?" (e.g., fever, blood in stool)       No  Protocols used: DIARRHEA-A-AH

## 2017-11-04 NOTE — Telephone Encounter (Signed)
Noted, will evaluate. 

## 2017-11-04 NOTE — Progress Notes (Signed)
Subjective:    Patient ID: Linda Mcgrath, female    DOB: [birth year], 84 y.o.   MRN: [id number]  HPI  Ms. Linda Mcgrath is an 84 year old female with a history of IBS, urinary incontinence, diverticulosis (without diverticulitis) who presents today with a chief complaint of diarrhea.   Her diarrhea began three days ago and is experiencing episodes having episodes every 2 hours. She also reports bilateral lower quadrant abdominal cramping that is intermittent with diarrhea episodes, decrease in appetite. She's drinking fluids without difficulty.   She's been taking Imodium-AD, Pepto Bismol, Kaopectate with temporary improvement. She denies bloody stools, nausea, vomiting, sick contacts. Her last colonoscopy was in 2014 with diverticulosis to the left lower colon, she denies a history of diverticulitis.   She was put on clindamycin in early February 2019 for URI, she completed this course.   Review of Systems  Constitutional: Negative for fever.  Respiratory: Negative for shortness of breath.   Gastrointestinal: Positive for abdominal pain and diarrhea. Negative for blood in stool, nausea and vomiting.  Neurological: Negative for weakness.       Past Medical History:  Diagnosis Date  . Allergic rhinitis   . Asthma   . Cataract    Bil/lens implant  . Colon polyp 1999   small polyp  . Diverticulosis   . Dysrhythmia   . Fibromyalgia   . Hyperlipidemia   . Hypothyroidism   . Internal hemorrhoids   . Leg cramps   . Lung nodule    stable LUL 9 mm  . Menopausal syndrome   . Osteoarthritis   . UTI (urinary tract infection)      Social History   Socioeconomic History  . Marital status: Married    Spouse name: Not on file  . Number of children: 2  . Years of education: Not on file  . Highest education level: Not on file  Social Needs  . Financial resource strain: Not on file  . Food insecurity - worry: Not on file  . Food insecurity - inability: Not on file  .  Transportation needs - medical: Not on file  . Transportation needs - non-medical: Not on file  Occupational History  . Occupation: TRAVEL AGENT    Employer: [employer]  Tobacco Use  . Smoking status: Never Smoker  . Smokeless tobacco: Never Used  Substance and Sexual Activity  . Alcohol use: No    Alcohol/week: 0.0 oz    Comment: occasional-rare  . Drug use: No  . Sexual activity: No  Other Topics Concern  . Not on file  Social History Narrative   Non-smoker      occ alcohol      Married-husband does the cooking      Works outside the home    Past Surgical History:  Procedure Laterality Date  . Abd U/S  11/1998   negative  . Abd U/S  01/2001   gallbladder polyps  . ABDOMINAL HYSTERECTOMY  1975   total-fibroids  . APPENDECTOMY  1975  . BREAST BIOPSY     benign  . BREAST EXCISIONAL BIOPSY Left 1957  . CARDIOVERSION N/A 03/07/2016   Procedure: CARDIOVERSION;  Surgeon: Yates Decamp, MD;  Location: [facility] ENDOSCOPY;  Service: Cardiovascular;  Laterality: N/A;  . CATARACT EXTRACTION     bilateral  . CHOLECYSTECTOMY    . COLONOSCOPY  1998   Diverticulosis; polyp\  . COLONOSCOPY  09/2002   Diverticulosis, hem  . COLONOSCOPY  12/2007   diverticulosis, polyp  .  DEXA  10/2001   osteopenia  . ELECTROPHYSIOLOGIC STUDY N/A 04/03/2016   Procedure: A-Flutter Ablation;  Surgeon: Will [name], MD;  Location: [facility] INVASIVE CV LAB;  Service: Cardiovascular;  Laterality: N/A;  . ESOPHAGOGASTRODUODENOSCOPY  2004  . [place] scan  01/2001   Negative  . KNEE SURGERY     right knee / 11/2004  . LAMINECTOMY WITH POSTERIOR LATERAL ARTHRODESIS LEVEL 2 N/A 07/31/2017   Procedure: DECOMPRESSIVE LAMINECTOMY LUMABR FOUR-FIVE, LUMBAR FIVE-SACRAL ONE;  Surgeon: Tia Alert, MD;  Location: [facility] OR;  Service: Neurosurgery;  Laterality: N/A;  . laser surgery for glaucoma Left Sep 21, 2014  . ROTATOR CUFF REPAIR     x 2 /right shoulder  . SKIN CANCER EXCISION     pre-melanoma / on face  . TEE WITHOUT  CARDIOVERSION N/A 03/07/2016   Procedure: TRANSESOPHAGEAL ECHOCARDIOGRAM (TEE);  Surgeon: Yates Decamp, MD;  Location[facility] ENDOSCOPY;  Service: Cardiovascular;  Laterality: N/A;  . TOE SURGERY     rt foot     Family History  Problem Relation Age of Onset  . Parkinsonism Father   . Colon cancer Brother   . Allergies Mother   . Heart disease Mother   . Kidney failure Son        congenital    Allergies  Allergen Reactions  . Shellfish Allergy Anaphylaxis and Nausea And Vomiting  . Tetracycline Hives, Nausea And Vomiting, Rash and Other (See Comments)    SYSTEMIC REACTION: heart palpitations,muscle spasms, vomiting  . Amlodipine Besylate Other (See Comments)    REACTION: muscle spasms, extremity swelling, low bp  . Ciprofloxacin Other (See Comments)    REACTION: muscle spasms, insomnia  . Codeine Other (See Comments)    REACTION: hallucinations  . Propoxyphene Hcl Other (See Comments)    Unknown  . Sulfamethoxazole-Trimethoprim Nausea Only and Other (See Comments)    REACTION: dizzy, could not focus eyes and could not concentrate and nausea  . Tape Other (See Comments)    BANDAIDS TAKE OFF HER SKIN; PLEASE USE COBAN OR PAPER   . Xarelto [Rivaroxaban] Other (See Comments)    Aches and pains and nervousness  . Amoxicillin Rash  . 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 Penicillins Swelling, Rash and Other (See Comments)    Has patient had a PCN reaction causing immediate rash, facial/tongue/throat swelling, SOB or lightheadedness with hypotension: Yes Has patient had a PCN reaction causing severe rash involving mucus membranes or skin necrosis: No Has patient had a PCN reaction that required hospitalization No Has patient had a PCN reaction occurring within the last 10 years: No If all of the above answers are "NO",  then may proceed with Cephalosporin use.     Current Outpatient Medications on File Prior to Visit  Medication Sig Dispense Refill  . Ascorbic Acid (VITAMIN C) 1000 MG tablet Take 1,000 mg by mouth daily.      . AZO-CRANBERRY PO Take 1 capsule by mouth 2 (two) times daily.    . B Complex Vitamins (VITAMIN B COMPLEX PO) Take 1 tablet by mouth daily.    . Biotin 10 MG TABS Take 1 tablet by mouth every morning.     . Calcium Citrate-Vitamin D (CALCIUM CITRATE + D) 250-200 MG-UNIT TABS Take 1 tablet by mouth daily.    Marland Kitchen  cholecalciferol (VITAMIN D) 1000 units tablet Take 1,000 Units by mouth daily.    . cyclobenzaprine (FLEXERIL) 10 MG tablet TAKE 1 TABLET (10 MG TOTAL) BY MOUTH AT BEDTIME AS NEEDED. (Patient taking differently: Take 10 mg by mouth at bedtime. ) 30 tablet 3  . estradiol (ESTRACE) 1 MG tablet TAKE 1 TABLET (1 MG TOTAL) BY MOUTH DAILY. 90 tablet 2  . Fexofenadine HCl (MUCINEX ALLERGY PO) Take 1 tablet by mouth daily as needed (Allergies).     . fluticasone (FLONASE) 50 MCG/ACT nasal spray Place 2 sprays into both nostrils daily as needed for allergies. 16 g 2  . folic acid (FOLVITE) 1 MG tablet Take 1 mg by mouth daily.    Marland Kitchen ibuprofen (ADVIL,MOTRIN) 200 MG tablet Take 400 mg by mouth at bedtime. For leg pain/discomfort    . levothyroxine (SYNTHROID, LEVOTHROID) 88 MCG tablet Take 1 tablet (88 mcg total) by mouth daily with breakfast. 90 tablet 3  . loratadine (CLARITIN) 10 MG tablet Take 10 mg by mouth daily as needed for allergies.     Marland Kitchen MAGNESIUM-OXIDE PO Take 1 tablet by mouth daily.    . montelukast (SINGULAIR) 10 MG tablet Take 1 tablet (10 mg total) by mouth at bedtime. 90 tablet 3  . Multiple Vitamin (MULTIVITAMIN) capsule Take 1 capsule by mouth daily.      . TURMERIC PO Take 1 capsule by mouth 2 (two) times daily.     . vitamin B-12 (CYANOCOBALAMIN) 500 MCG tablet Take 500 mcg by mouth daily.     No current facility-administered medications on file prior to visit.      BP 126/66   Pulse 85   Temp 98 F (36.7 C) (Oral)   Ht 5' 2.25" (1.581 m)   Wt 175 lb 8 oz (79.6 kg)   LMP 08/26/1969   SpO2 99%   BMI 31.84 kg/m  ]   Objective:   Physical Exam  Constitutional: She appears well-nourished. She does not appear ill.  Cardiovascular: Normal rate and regular rhythm.  Pulmonary/Chest: Effort normal and breath sounds normal.  Abdominal: Soft. Bowel sounds are increased. There is generalized tenderness.  Skin: Skin is warm and dry.          Assessment & Plan:  Acute Diarrhea:  Present for 3 days, overall no significant improvement. Exam today stable, she does not appear acutely dehydrated. Differentials include c-diff, viral gastritis, acute diverticulitis, other stool pathogen. Check CMP, CBC, Stool studies. She was on clindamycin about one month ago. Continue to stay hydrated with fluids, advance diet as tolerated. Await labs.  Doreene Nest, NP

## 2017-11-05 ENCOUNTER — Other Ambulatory Visit: Payer: Medicare Other

## 2017-11-05 DIAGNOSIS — R1030 Lower abdominal pain, unspecified: Secondary | ICD-10-CM | POA: Diagnosis not present

## 2017-11-05 DIAGNOSIS — D729 Disorder of white blood cells, unspecified: Secondary | ICD-10-CM

## 2017-11-05 DIAGNOSIS — R1031 Right lower quadrant pain: Secondary | ICD-10-CM | POA: Diagnosis not present

## 2017-11-05 DIAGNOSIS — R1032 Left lower quadrant pain: Secondary | ICD-10-CM | POA: Diagnosis not present

## 2017-11-05 DIAGNOSIS — R197 Diarrhea, unspecified: Secondary | ICD-10-CM | POA: Diagnosis not present

## 2017-11-05 LAB — CBC WITH DIFFERENTIAL/PLATELET
Basophils Absolute: 0 10*3/uL (ref 0.0–0.1)
Basophils Relative: 0.3 % (ref 0.0–3.0)
Eosinophils Absolute: 0.6 10*3/uL (ref 0.0–0.7)
Eosinophils Relative: 7.6 % — ABNORMAL HIGH (ref 0.0–5.0)
HCT: 44.2 % (ref 36.0–46.0)
Hemoglobin: 15.1 g/dL — ABNORMAL HIGH (ref 12.0–15.0)
Lymphocytes Relative: 31.7 % (ref 12.0–46.0)
Lymphs Abs: 2.7 10*3/uL (ref 0.7–4.0)
MCHC: 34.1 g/dL (ref 30.0–36.0)
MCV: 94.4 fl (ref 78.0–100.0)
Monocytes Absolute: 1.7 10*3/uL — ABNORMAL HIGH (ref 0.1–1.0)
Monocytes Relative: 19.3 % — ABNORMAL HIGH (ref 3.0–12.0)
Neutro Abs: 3.5 10*3/uL (ref 1.4–7.7)
Neutrophils Relative %: 41.1 % — ABNORMAL LOW (ref 43.0–77.0)
Platelets: 263 10*3/uL (ref 150.0–400.0)
RBC: 4.69 Mil/uL (ref 3.87–5.11)
RDW: 13.3 % (ref 11.5–15.5)
WBC: 8.5 10*3/uL (ref 4.0–10.5)

## 2017-11-05 NOTE — Addendum Note (Signed)
Addended by: Ellamae Sia on: 11/05/2017 09:50 AM   Modules accepted: Orders

## 2017-11-06 ENCOUNTER — Other Ambulatory Visit: Payer: Self-pay | Admitting: Primary Care

## 2017-11-06 DIAGNOSIS — A0472 Enterocolitis due to Clostridium difficile, not specified as recurrent: Secondary | ICD-10-CM

## 2017-11-06 LAB — GASTROINTESTINAL PATHOGEN PANEL PCR
C. difficile Tox A/B, PCR: DETECTED — AB
Campylobacter, PCR: NOT DETECTED
Cryptosporidium, PCR: NOT DETECTED
E coli (ETEC) LT/ST PCR: NOT DETECTED
E coli (STEC) stx1/stx2, PCR: NOT DETECTED
E coli 0157, PCR: NOT DETECTED
Giardia lamblia, PCR: NOT DETECTED
Norovirus, PCR: NOT DETECTED
Rotavirus A, PCR: NOT DETECTED
Salmonella, PCR: NOT DETECTED
Shigella, PCR: NOT DETECTED

## 2017-11-06 LAB — PATHOLOGIST SMEAR REVIEW

## 2017-11-06 MED ORDER — METRONIDAZOLE 500 MG PO TABS: 500.0000 mg | ORAL_TABLET | Freq: Three times a day (TID) | ORAL | 0 refills | Status: DC

## 2017-11-10 ENCOUNTER — Telehealth: Payer: Self-pay | Admitting: Family Medicine

## 2017-11-10 NOTE — Telephone Encounter (Signed)
Copied from [place] [phone]. Topic: Quick Communication - See Telephone Encounter >> Nov 10, 2017 12:11 PM Boyd Kerbs wrote: CRM for notification. See Telephone encounter for:   Pt. Was seen on 3/12 : she is feeling ok now.   Stomach cramps has stopped, but stool is soft and frequent.   She does have physical schedule next Monday. Said she would be ok until Monday appt..  Unless you think otherwise.Marland Kitchen     11/10/17.

## 2017-11-10 NOTE — Telephone Encounter (Signed)
Patient advised.

## 2017-11-10 NOTE — Telephone Encounter (Signed)
Glad she is improving I will cc to Anda Kraft who saw her for FYI Monday is fine if she continues to improve-otherwise let us know and we will try to get her in this week

## 2017-11-10 NOTE — Telephone Encounter (Signed)
Noted and glad to hear she's improving. Currently under treatment for c-diff with Flagyl course.

## 2017-11-17 ENCOUNTER — Ambulatory Visit (INDEPENDENT_AMBULATORY_CARE_PROVIDER_SITE_OTHER): Payer: Medicare Other | Admitting: Family Medicine

## 2017-11-17 ENCOUNTER — Encounter: Payer: Self-pay | Admitting: Family Medicine

## 2017-11-17 VITALS — BP 126/72 | HR 68 | Temp 97.5°F | Ht 63.0 in | Wt 177.5 lb

## 2017-11-17 DIAGNOSIS — E559 Vitamin D deficiency, unspecified: Secondary | ICD-10-CM

## 2017-11-17 DIAGNOSIS — E6609 Other obesity due to excess calories: Secondary | ICD-10-CM

## 2017-11-17 DIAGNOSIS — E7800 Pure hypercholesterolemia, unspecified: Secondary | ICD-10-CM

## 2017-11-17 DIAGNOSIS — Z6832 Body mass index (BMI) 32.0-32.9, adult: Secondary | ICD-10-CM

## 2017-11-17 DIAGNOSIS — E039 Hypothyroidism, unspecified: Secondary | ICD-10-CM

## 2017-11-17 DIAGNOSIS — I483 Typical atrial flutter: Secondary | ICD-10-CM

## 2017-11-17 DIAGNOSIS — I10 Essential (primary) hypertension: Secondary | ICD-10-CM

## 2017-11-17 DIAGNOSIS — M8589 Other specified disorders of bone density and structure, multiple sites: Secondary | ICD-10-CM

## 2017-11-17 DIAGNOSIS — R739 Hyperglycemia, unspecified: Secondary | ICD-10-CM

## 2017-11-17 DIAGNOSIS — N951 Menopausal and female climacteric states: Secondary | ICD-10-CM

## 2017-11-17 MED ORDER — MONTELUKAST SODIUM 10 MG PO TABS: 10.0000 mg | ORAL_TABLET | Freq: Every day | ORAL | 3 refills | Status: DC

## 2017-11-17 MED ORDER — ESTRADIOL 1 MG PO TABS: 1.0000 mg | ORAL_TABLET | Freq: Every day | ORAL | 3 refills | Status: DC

## 2017-11-17 MED ORDER — CYCLOBENZAPRINE HCL 10 MG PO TABS: 10.0000 mg | ORAL_TABLET | Freq: Every evening | ORAL | 5 refills | Status: DC | PRN

## 2017-11-17 MED ORDER — LEVOTHYROXINE SODIUM 88 MCG PO TABS: 88.0000 ug | ORAL_TABLET | Freq: Every day | ORAL | 3 refills | Status: DC

## 2017-11-17 NOTE — Assessment & Plan Note (Signed)
Hypothyroidism  Pt has no clinical changes No change in energy level/ hair or skin/ edema and no tremor Lab Results  Component Value Date   TSH 1.12 11/15/2016

## 2017-11-17 NOTE — Assessment & Plan Note (Signed)
Lipid panel today  Disc goals for lipids and reasons to control them Rev labs with pt (last year) Rev low sat fat diet in detail

## 2017-11-17 NOTE — Assessment & Plan Note (Signed)
bp in fair control at this time  BP Readings from Last 1 Encounters:  11/17/17 126/72   No changes needed Disc lifstyle change with low sodium diet and exercise  Labs rev

## 2017-11-17 NOTE — Assessment & Plan Note (Signed)
No re occurance 

## 2017-11-17 NOTE — Assessment & Plan Note (Signed)
D level today  Disc imp to bone and overall health   

## 2017-11-17 NOTE — Assessment & Plan Note (Signed)
Rev dexa 4/18 Disc need for calcium/ vitamin D/ wt bearing exercise and bone density test every 2 y to monitor Disc safety/ fracture risk in detail

## 2017-11-17 NOTE — Assessment & Plan Note (Signed)
Pt wants to continue estrace despite risks including breast cancer and blood clots Voices understanding  This improves her quality of life by improving vasomotor symptoms

## 2017-11-17 NOTE — Patient Instructions (Addendum)
As c diff continues to improve- call us in 1-2 months if you want to get signed up for cologuard for colon cancer screening   Stay on waiting list for the Shingrix vaccine   Stop up front to make an appointment with Dr Lorelei Pont for your knee   Take care of yourself   Stay as active as you can- advancing slowly

## 2017-11-17 NOTE — Progress Notes (Signed)
Subjective:    Patient ID: Linda Mcgrath, female    DOB: 1933/01/13, 84 y.o.   MRN: [id number]  HPI  Here for annual f/u of chronic medical problems   Wt Readings from Last 3 Encounters:  11/17/17 177 lb 8 oz (80.5 kg)  11/04/17 175 lb 8 oz (79.6 kg)  10/03/17 178 lb 4 oz (80.9 kg)   31.44 kg/m   Recent diag/tx for c diff with flagyl (had been on clindamycin) Doing better from that  She also bought some probiotics    Laminectomy 12/18-had a complication and took longer to get better  Doing better from that  Uses cane or walker at all times- trying not to fall  Son moved in with her- and this is helpful   R knee bothering her since PT  Has had knee pain on that side (meniscal inj and surg in the past- now OA)   Colonoscopy 7/14 with adenomatous polyp- was told she did not have to do another  Brother had colon cancer    Flu shot 10/18  Mammogram 4/18  (already has it scheduled for mid April) Self breast exam -no lumps   dexa 4/18-stable osteopenia  Every 2 years  Taking her ca and D  zostavax 8/09-is on wait list for Shingrix   bp is stable today  No cp or palpitations or headaches or edema  No side effects to medicines  BP Readings from Last 3 Encounters:  11/17/17 126/72  11/04/17 126/66  10/03/17 (!) 150/76     Hypothyroidism  Pt has no clinical changes No change in energy level/ hair or skin/ edema and no tremor Lab Results  Component Value Date   TSH 1.12 11/15/2016     Lab Results  Component Value Date   CREATININE 0.94 11/04/2017   BUN 8 11/04/2017   NA 136 11/04/2017   K 4.2 11/04/2017   CL 102 11/04/2017   CO2 28 11/04/2017   Lab Results  Component Value Date   ALT 15 11/04/2017   AST 14 11/04/2017   ALKPHOS 62 11/04/2017   BILITOT 0.6 11/04/2017    Hyperglycemia Lab Results  Component Value Date   HGBA1C 5.4 11/15/2016     Hyperlipidemia Lab Results  Component Value Date   CHOL 196 11/15/2016   HDL 67.10  11/15/2016   LDLCALC 114 (H) 11/15/2016   LDLDIRECT 125.7 06/09/2013   TRIG 75.0 11/15/2016   CHOLHDL 3 11/15/2016   Vit D def- last level 25  Due for labs today   Patient Active Problem List   Diagnosis Date Noted  . S/P lumbar spinal fusion 07/31/2017  . Low back pain with right-sided sciatica 03/04/2017  . Atrial flutter (HCC) 03/06/2016  . Obesity 11/06/2015  . Urge incontinence 01/24/2015  . Estrogen deficiency 10/25/2014  . Lichen planus 12/20/2013  . Encounter for Medicare annual wellness exam 06/17/2013  . Screening mammogram, encounter for 06/15/2012  . Hyperglycemia 06/03/2011  . HYPERTENSION, BENIGN ESSENTIAL 10/23/2010  . CONSTIPATION 10/23/2010  . HEMATOCHEZIA 10/23/2010  . OSTEOARTHRITIS, HANDS, BILATERAL 01/24/2010  . Hypothyroidism 08/24/2008  . Vitamin D deficiency 06/06/2008  . Hyperlipidemia 06/06/2008  . MENOPAUSAL SYNDROME 03/29/2008  . Osteopenia 03/29/2008  . ALLERGIC RHINITIS 01/25/2008  . IBS 01/25/2008  . OSTEOARTHRITIS 01/25/2008  . FIBROMYALGIA 01/25/2008   Past Medical History:  Diagnosis Date  . Allergic rhinitis   . Asthma   . Cataract    Bil/lens implant  . Colon polyp 1999   small polyp  .  Diverticulosis   . Dysrhythmia   . Fibromyalgia   . Hyperlipidemia   . Hypothyroidism   . Internal hemorrhoids   . Leg cramps   . Lung nodule    stable LUL 9 mm  . Menopausal syndrome   . Osteoarthritis   . UTI (urinary tract infection)    Past Surgical History:  Procedure Laterality Date  . Abd U/S  11/1998   negative  . Abd U/S  01/2001   gallbladder polyps  . ABDOMINAL HYSTERECTOMY  1975   total-fibroids  . APPENDECTOMY  1975  . BREAST BIOPSY     benign  . BREAST EXCISIONAL BIOPSY Left 1957  . CARDIOVERSION N/A 03/07/2016   Procedure: CARDIOVERSION;  Surgeon: Yates Decamp, MD;  Location: [facility] ENDOSCOPY;  Service: Cardiovascular;  Laterality: N/A;  . CATARACT EXTRACTION     bilateral  . CHOLECYSTECTOMY    . COLONOSCOPY  1998    Diverticulosis; polyp\  . COLONOSCOPY  09/2002   Diverticulosis, hem  . COLONOSCOPY  12/2007   diverticulosis, polyp  . DEXA  10/2001   osteopenia  . ELECTROPHYSIOLOGIC STUDY N/A 04/03/2016   Procedure: A-Flutter Ablation;  Surgeon: Will [name], MD;  Location: [facility] INVASIVE CV LAB;  Service: Cardiovascular;  Laterality: N/A;  . ESOPHAGOGASTRODUODENOSCOPY  2004  . Hida scan  01/2001   Negative  . KNEE SURGERY     right knee / 11/2004  . LAMINECTOMY WITH POSTERIOR LATERAL ARTHRODESIS LEVEL 2 N/A 07/31/2017   Procedure: DECOMPRESSIVE LAMINECTOMY LUMABR FOUR-FIVE, LUMBAR FIVE-SACRAL ONE;  Surgeon: Tia Alert, MD;  Location: [facility] OR;  Service: Neurosurgery;  Laterality: N/A;  . laser surgery for glaucoma Left Sep 21, 2014  . ROTATOR CUFF REPAIR     x 2 /right shoulder  . SKIN CANCER EXCISION     pre-melanoma / on face  . TEE WITHOUT CARDIOVERSION N/A 03/07/2016   Procedure: TRANSESOPHAGEAL ECHOCARDIOGRAM (TEE);  Surgeon: Yates Decamp, MD;  Location: [facility] ENDOSCOPY;  Service: Cardiovascular;  Laterality: N/A;  . TOE SURGERY     rt foot    Social History   Tobacco Use  . Smoking status: Never Smoker  . Smokeless tobacco: Never Used  Substance Use Topics  . Alcohol use: No    Alcohol/week: 0.0 oz    Comment: occasional-rare  . Drug use: No   Family History  Problem Relation Age of Onset  . Parkinsonism Father   . Colon cancer Brother   . Allergies Mother   . Heart disease Mother   . Kidney failure Son        congenital   Allergies  Allergen Reactions  . Shellfish Allergy Anaphylaxis and Nausea And Vomiting  . Tetracycline Hives, Nausea And Vomiting, Rash and Other (See Comments)    SYSTEMIC REACTION: heart palpitations,muscle spasms, vomiting  . Amlodipine Besylate Other (See Comments)    REACTION: muscle spasms, extremity swelling, low bp  . Ciprofloxacin Other (See Comments)    REACTION: muscle spasms, insomnia  . Codeine Other (See Comments)    REACTION: hallucinations    . Propoxyphene Hcl Other (See Comments)    Unknown  . Sulfamethoxazole-Trimethoprim Nausea Only and Other (See Comments)    REACTION: dizzy, could not focus eyes and could not concentrate and nausea  . Tape Other (See Comments)    BANDAIDS TAKE OFF HER SKIN; PLEASE USE COBAN OR PAPER   . Xarelto [Rivaroxaban] Other (See Comments)    Aches and pains and nervousness  . Amoxicillin Rash  . Other  Swelling, Rash and Other (See Comments)    Has autoimmune disorder and spices erode her salivary glands and affects ankles and mouth DIRECTLY (takes off 1st layer of skin and leaves her unable to walk)  . Penicillins Swelling, Rash and Other (See Comments)    Has patient had a PCN reaction causing immediate rash, facial/tongue/throat swelling, SOB or lightheadedness with hypotension: Yes Has patient had a PCN reaction causing severe rash involving mucus membranes or skin necrosis: No Has patient had a PCN reaction that required hospitalization No Has patient had a PCN reaction occurring within the last 10 years: No If all of the above answers are "NO", then may proceed with Cephalosporin use.    Current Outpatient Medications on File Prior to Visit  Medication Sig Dispense Refill  . Ascorbic Acid (VITAMIN C) 1000 MG tablet Take 1,000 mg by mouth daily.      . AZO-CRANBERRY PO Take 1 capsule by mouth 2 (two) times daily.    . B Complex Vitamins (VITAMIN B COMPLEX PO) Take 1 tablet by mouth daily.    . Biotin 10 MG TABS Take 1 tablet by mouth every morning.     . Calcium Citrate-Vitamin D (CALCIUM CITRATE + D) 250-200 MG-UNIT TABS Take 1 tablet by mouth daily.    . cholecalciferol (VITAMIN D) 1000 units tablet Take 1,000 Units by mouth daily.    Marland Kitchen Fexofenadine HCl (MUCINEX ALLERGY PO) Take 1 tablet by mouth daily as needed (Allergies).     . fluticasone (FLONASE) 50 MCG/ACT nasal spray Place 2 sprays into both nostrils daily as needed for allergies. 16 g 2  . folic acid (FOLVITE) 1 MG tablet Take 1  mg by mouth daily.    Marland Kitchen ibuprofen (ADVIL,MOTRIN) 200 MG tablet Take 400 mg by mouth at bedtime. For leg pain/discomfort    . loratadine (CLARITIN) 10 MG tablet Take 10 mg by mouth daily as needed for allergies.     Marland Kitchen MAGNESIUM-OXIDE PO Take 1 tablet by mouth daily.    . Multiple Vitamin (MULTIVITAMIN) capsule Take 1 capsule by mouth daily.      . TURMERIC PO Take 1 capsule by mouth 2 (two) times daily.     . vitamin B-12 (CYANOCOBALAMIN) 500 MCG tablet Take 500 mcg by mouth daily.     No current facility-administered medications on file prior to visit.      Review of Systems  Constitutional: Positive for fatigue. Negative for activity change, appetite change, fever and unexpected weight change.  HENT: Negative for congestion, ear pain, rhinorrhea, sinus pressure and sore throat.   Eyes: Negative for pain, redness and visual disturbance.  Respiratory: Negative for cough, shortness of breath and wheezing.   Cardiovascular: Negative for chest pain and palpitations.  Gastrointestinal: Negative for abdominal pain, blood in stool, constipation and diarrhea.  Endocrine: Negative for polydipsia and polyuria.  Genitourinary: Negative for dysuria, frequency and urgency.  Musculoskeletal: Positive for arthralgias and back pain. Negative for myalgias.  Skin: Negative for pallor and rash.  Allergic/Immunologic: Negative for environmental allergies.  Neurological: Negative for dizziness, syncope and headaches.  Hematological: Negative for adenopathy. Does not bruise/bleed easily.  Psychiatric/Behavioral: Negative for decreased concentration and dysphoric mood. The patient is not nervous/anxious.        Objective:   Physical Exam  Constitutional: She appears well-developed and well-nourished. No distress.  obese and well appearing   HENT:  Head: Normocephalic and atraumatic.  Right Ear: External ear normal.  Left Ear: External ear normal.  Mouth/Throat: Oropharynx is clear and moist.  Eyes:  Pupils are equal, round, and reactive to light. Conjunctivae and EOM are normal. No scleral icterus.  Neck: Normal range of motion. Neck supple. No JVD present. Carotid bruit is not present. No thyromegaly present.  Cardiovascular: Normal rate, regular rhythm, normal heart sounds and intact distal pulses. Exam reveals no gallop.  Pulmonary/Chest: Effort normal and breath sounds normal. No respiratory distress. She has no wheezes. She exhibits no tenderness. No breast swelling, tenderness, discharge or bleeding.  Abdominal: Soft. Bowel sounds are normal. She exhibits no distension, no abdominal bruit and no mass. There is no tenderness.  Genitourinary: No breast swelling, tenderness, discharge or bleeding.  Genitourinary Comments: Breast exam: No mass, nodules, thickening, tenderness, bulging, retraction, inflamation, nipple discharge or skin changes noted.  No axillary or clavicular LA.      Musculoskeletal: Normal range of motion. She exhibits no edema or tenderness.  No kyphosis   Lymphadenopathy:    She has no cervical adenopathy.  Neurological: She is alert. She has normal reflexes. No cranial nerve deficit. She exhibits normal muscle tone. Coordination normal.  Skin: Skin is warm and dry. No rash noted. No erythema. No pallor.  Solar lentigines diffusely Angiomas and sks noted  Psychiatric: She has a normal mood and affect.          Assessment & Plan:   Problem List Items Addressed This Visit      Cardiovascular and Mediastinum   Atrial flutter (HCC)    No re occurance      HYPERTENSION, BENIGN ESSENTIAL - Primary    bp in fair control at this time  BP Readings from Last 1 Encounters:  11/17/17 126/72   No changes needed Disc lifstyle change with low sodium diet and exercise  Labs rev       Relevant Orders   CBC with Differential/Platelet   Lipid panel   TSH     Endocrine   Hypothyroidism    Hypothyroidism  Pt has no clinical changes No change in energy level/  hair or skin/ edema and no tremor Lab Results  Component Value Date   TSH 1.12 11/15/2016          Relevant Medications   levothyroxine (SYNTHROID, LEVOTHROID) 88 MCG tablet   Other Relevant Orders   TSH     Musculoskeletal and Integument   Osteopenia    Rev dexa 4/18 Disc need for calcium/ vitamin D/ wt bearing exercise and bone density test every 2 y to monitor Disc safety/ fracture risk in detail           Other   Hyperglycemia    A1C today  Recent illness/surgery -may be up  disc imp of low glycemic diet and wt loss to prevent DM2       Relevant Orders   Hemoglobin A1c   Hyperlipidemia    Lipid panel today  Disc goals for lipids and reasons to control them Rev labs with pt (last year) Rev low sat fat diet in detail       Relevant Orders   Lipid panel   MENOPAUSAL SYNDROME    Pt wants to continue estrace despite risks including breast cancer and blood clots Voices understanding  This improves her quality of life by improving vasomotor symptoms      Obesity    Discussed how this problem influences overall health and the risks it imposes  Reviewed plan for weight loss with lower calorie diet (via better food choices  and also portion control or program like weight watchers) and exercise building up to or more than 30 minutes 5 days per week including some aerobic activity         Vitamin D deficiency    D level today  Disc imp to bone and overall health      Relevant Orders   VITAMIN D 25 Hydroxy (Vit-D Deficiency, Fractures)

## 2017-11-17 NOTE — Assessment & Plan Note (Signed)
Discussed how this problem influences overall health and the risks it imposes  Reviewed plan for weight loss with lower calorie diet (via better food choices and also portion control or program like weight watchers) and exercise building up to or more than 30 minutes 5 days per week including some aerobic activity    

## 2017-11-17 NOTE — Assessment & Plan Note (Signed)
A1C today  Recent illness/surgery -may be up  disc imp of low glycemic diet and wt loss to prevent DM2

## 2017-11-18 DIAGNOSIS — Z961 Presence of intraocular lens: Secondary | ICD-10-CM | POA: Diagnosis not present

## 2017-11-18 DIAGNOSIS — H401123 Primary open-angle glaucoma, left eye, severe stage: Secondary | ICD-10-CM | POA: Diagnosis not present

## 2017-11-18 LAB — CBC WITH DIFFERENTIAL/PLATELET
Basophils Absolute: 0 10*3/uL (ref 0.0–0.1)
Basophils Relative: 0.3 % (ref 0.0–3.0)
Eosinophils Absolute: 0.3 10*3/uL (ref 0.0–0.7)
Eosinophils Relative: 3.1 % (ref 0.0–5.0)
HCT: 43.5 % (ref 36.0–46.0)
Hemoglobin: 14.5 g/dL (ref 12.0–15.0)
Lymphocytes Relative: 42.8 % (ref 12.0–46.0)
Lymphs Abs: 3.5 10*3/uL (ref 0.7–4.0)
MCHC: 33.4 g/dL (ref 30.0–36.0)
MCV: 94.9 fl (ref 78.0–100.0)
Monocytes Absolute: 0.6 10*3/uL (ref 0.1–1.0)
Monocytes Relative: 7.6 % (ref 3.0–12.0)
Neutro Abs: 3.8 10*3/uL (ref 1.4–7.7)
Neutrophils Relative %: 46.2 % (ref 43.0–77.0)
Platelets: 377 10*3/uL (ref 150.0–400.0)
RBC: 4.58 Mil/uL (ref 3.87–5.11)
RDW: 14.1 % (ref 11.5–15.5)
WBC: 8.2 10*3/uL (ref 4.0–10.5)

## 2017-11-18 LAB — TSH: TSH: 4.41 u[IU]/mL (ref 0.35–4.50)

## 2017-11-18 LAB — LIPID PANEL
Cholesterol: 193 mg/dL (ref 0–200)
HDL: 69.1 mg/dL (ref >39.00)
LDL Cholesterol: 100 mg/dL — ABNORMAL HIGH (ref 0–99)
NonHDL: 124.24
Total CHOL/HDL Ratio: 3
Triglycerides: 121 mg/dL (ref 0.0–149.0)
VLDL: 24.2 mg/dL (ref 0.0–40.0)

## 2017-11-18 LAB — VITAMIN D 25 HYDROXY (VIT D DEFICIENCY, FRACTURES): VITD: 51.24 ng/mL (ref 30.00–100.00)

## 2017-11-18 LAB — HEMOGLOBIN A1C: Hgb A1c MFr Bld: 5.4 % (ref 4.6–6.5)

## 2017-11-19 ENCOUNTER — Encounter: Payer: Self-pay | Admitting: *Deleted

## 2017-12-01 ENCOUNTER — Encounter: Payer: Self-pay | Admitting: Family Medicine

## 2017-12-01 ENCOUNTER — Ambulatory Visit (INDEPENDENT_AMBULATORY_CARE_PROVIDER_SITE_OTHER)
Admission: RE | Admit: 2017-12-01 | Discharge: 2017-12-01 | Disposition: A | Discharge status: Home / Self Care | Payer: Medicare Other | Source: Ambulatory Visit | Attending: Family Medicine | Admitting: Family Medicine

## 2017-12-01 ENCOUNTER — Ambulatory Visit (INDEPENDENT_AMBULATORY_CARE_PROVIDER_SITE_OTHER): Payer: Medicare Other | Admitting: Family Medicine

## 2017-12-01 ENCOUNTER — Other Ambulatory Visit: Payer: Self-pay

## 2017-12-01 ENCOUNTER — Encounter: Payer: Self-pay | Admitting: *Deleted

## 2017-12-01 ENCOUNTER — Ambulatory Visit: Payer: Medicare Other | Admitting: Family Medicine

## 2017-12-01 VITALS — BP 140/80 | HR 73 | Temp 97.5°F | Ht 63.0 in | Wt 179.5 lb

## 2017-12-01 DIAGNOSIS — M25561 Pain in right knee: Secondary | ICD-10-CM | POA: Diagnosis not present

## 2017-12-01 DIAGNOSIS — M1711 Unilateral primary osteoarthritis, right knee: Secondary | ICD-10-CM

## 2017-12-01 MED ORDER — METHYLPREDNISOLONE ACETATE 40 MG/ML IJ SUSP
80.0000 mg | Freq: Once | INTRAMUSCULAR | Status: AC
  Administered 2017-12-01: 80 mg via INTRA_ARTICULAR

## 2017-12-01 NOTE — Progress Notes (Signed)
Dr. Karleen Hampshire T. Jenissa Tyrell, MD, CAQ [facility] [address] [place], [address] Phone: [phone] Fax: [phone]  12/01/2017  Patient: Linda Mcgrath, MRN: [id number], DOB: 03/05/1933, 84 y.o.  Primary Physician:  [name], Audrie Gallus, MD   Chief Complaint  Patient presents with  . Knee Pain    Right   Subjective:   Linda Mcgrath is a 84 y.o. very pleasant female patient who presents with the following:  15 years ago, had knee surgery. Now pain with bending. 15 years pain.  She has basically been having pain with her right knee for at least 15 years, but according to her son it is gotten worse a lot in the last year.  She has not had any recent injury that she can recall.  She does take turmeric twice a day, which she thinks helped quite a bit.  She also takes some Advil and Tylenol as well as some various topical remedies.  Dr. Emogene Morgan in [place].   R knee inj  Past Medical History, Surgical History, Social History, Family History, Problem List, Medications, and Allergies have been reviewed and updated if relevant.  Patient Active Problem List   Diagnosis Date Noted  . S/P lumbar spinal fusion 07/31/2017  . Low back pain with right-sided sciatica 03/04/2017  . Atrial flutter (HCC) 03/06/2016  . Obesity 11/06/2015  . Urge incontinence 01/24/2015  . Estrogen deficiency 10/25/2014  . Lichen planus 12/20/2013  . Encounter for Medicare annual wellness exam 06/17/2013  . Screening mammogram, encounter for 06/15/2012  . Hyperglycemia 06/03/2011  . HYPERTENSION, BENIGN ESSENTIAL 10/23/2010  . CONSTIPATION 10/23/2010  . HEMATOCHEZIA 10/23/2010  . OSTEOARTHRITIS, HANDS, BILATERAL 01/24/2010  . Hypothyroidism 08/24/2008  . Vitamin D deficiency 06/06/2008  . Hyperlipidemia 06/06/2008  . MENOPAUSAL SYNDROME 03/29/2008  . Osteopenia 03/29/2008  . ALLERGIC RHINITIS 01/25/2008  . IBS 01/25/2008  .  OSTEOARTHRITIS 01/25/2008  . FIBROMYALGIA 01/25/2008    Past Medical History:  Diagnosis Date  . Allergic rhinitis   . Asthma   . Cataract    Bil/lens implant  . Colon polyp 1999   small polyp  . Diverticulosis   . Dysrhythmia   . Fibromyalgia   . Hyperlipidemia   . Hypothyroidism   . Internal hemorrhoids   . Leg cramps   . Lung nodule    stable LUL 9 mm  . Menopausal syndrome   . Osteoarthritis   . UTI (urinary tract infection)     Past Surgical History:  Procedure Laterality Date  . Abd U/S  11/1998   negative  . Abd U/S  01/2001   gallbladder polyps  . ABDOMINAL HYSTERECTOMY  1975   total-fibroids  . APPENDECTOMY  1975  . BREAST BIOPSY     benign  . BREAST EXCISIONAL BIOPSY Left 1957  . CARDIOVERSION N/A 03/07/2016   Procedure: CARDIOVERSION;  Surgeon: Yates Decamp, MD;  Location: [facility] ENDOSCOPY;  Service: Cardiovascular;  Laterality: N/A;  . CATARACT EXTRACTION     bilateral  . CHOLECYSTECTOMY    . COLONOSCOPY  1998   Diverticulosis; polyp\  . COLONOSCOPY  09/2002   Diverticulosis, hem  . COLONOSCOPY  12/2007   diverticulosis, polyp  . DEXA  10/2001   osteopenia  . ELECTROPHYSIOLOGIC STUDY N/A 04/03/2016   Procedure: A-Flutter Ablation;  Surgeon: Will [name], MD;  Location: [facility] INVASIVE CV LAB;  Service: Cardiovascular;  Laterality: N/A;  . ESOPHAGOGASTRODUODENOSCOPY  2004  . Hida scan  01/2001  Negative  . KNEE SURGERY     right knee / 11/2004  . LAMINECTOMY WITH POSTERIOR LATERAL ARTHRODESIS LEVEL 2 N/A 07/31/2017   Procedure: DECOMPRESSIVE LAMINECTOMY LUMABR FOUR-FIVE, LUMBAR FIVE-SACRAL ONE;  Surgeon: Tia Alert, MD;  Location: [facility] OR;  Service: Neurosurgery;  Laterality: N/A;  . laser surgery for glaucoma Left Sep 21, 2014  . ROTATOR CUFF REPAIR     x 2 /right shoulder  . SKIN CANCER EXCISION     pre-melanoma / on face  . TEE WITHOUT CARDIOVERSION N/A 03/07/2016   Procedure: TRANSESOPHAGEAL ECHOCARDIOGRAM (TEE);  Surgeon: Yates Decamp, MD;  Location:  [facility] ENDOSCOPY;  Service: Cardiovascular;  Laterality: N/A;  . TOE SURGERY     rt foot     Social History   Socioeconomic History  . Marital status: Married    Spouse name: Not on file  . Number of children: 2  . Years of education: Not on file  . Highest education level: Not on file  Occupational History  . Occupation: TRAVEL AGENT    Employer: [employer]  Social Needs  . Financial resource strain: Not on file  . Food insecurity:    Worry: Not on file    Inability: Not on file  . Transportation needs:    Medical: Not on file    Non-medical: Not on file  Tobacco Use  . Smoking status: Never Smoker  . Smokeless tobacco: Never Used  Substance and Sexual Activity  . Alcohol use: No    Alcohol/week: 0.0 oz    Comment: occasional-rare  . Drug use: No  . Sexual activity: Never  Lifestyle  . Physical activity:    Days per week: Not on file    Minutes per session: Not on file  . Stress: Not on file  Relationships  . Social connections:    Talks on phone: Not on file    Gets together: Not on file    Attends religious service: Not on file    Active member of club or organization: Not on file    Attends meetings of clubs or organizations: Not on file    Relationship status: Not on file  . Intimate partner violence:    Fear of current or ex partner: Not on file    Emotionally abused: Not on file    Physically abused: Not on file    Forced sexual activity: Not on file  Other Topics Concern  . Not on file  Social History Narrative   Non-smoker      occ alcohol      Married-husband does the cooking      Works outside the home    Family History  Problem Relation Age of Onset  . Parkinsonism Father   . Colon cancer Brother   . Allergies Mother   . Heart disease Mother   . Kidney failure Son        congenital    Allergies  Allergen Reactions  . Shellfish Allergy Anaphylaxis and Nausea And Vomiting  . Tetracycline Hives, Nausea And Vomiting, Rash and Other  (See Comments)    SYSTEMIC REACTION: heart palpitations,muscle spasms, vomiting  . Amlodipine Besylate Other (See Comments)    REACTION: muscle spasms, extremity swelling, low bp  . Ciprofloxacin Other (See Comments)    REACTION: muscle spasms, insomnia  . Codeine Other (See Comments)    REACTION: hallucinations  . Propoxyphene Hcl Other (See Comments)    Unknown  . Sulfamethoxazole-Trimethoprim Nausea Only and Other (See Comments)  REACTION: dizzy, could not focus eyes and could not concentrate and nausea  . Tape Other (See Comments)    BANDAIDS TAKE OFF HER SKIN; PLEASE USE COBAN OR PAPER   . Xarelto [Rivaroxaban] Other (See Comments)    Aches and pains and nervousness  . Amoxicillin Rash  . 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 Penicillins Swelling, Rash and Other (See Comments)    Has patient had a PCN reaction causing immediate rash, facial/tongue/throat swelling, SOB or lightheadedness with hypotension: Yes Has patient had a PCN reaction causing severe rash involving mucus membranes or skin necrosis: No Has patient had a PCN reaction that required hospitalization No Has patient had a PCN reaction occurring within the last 10 years: No If all of the above answers are "NO", then may proceed with Cephalosporin use.     Medication list reviewed and updated in full in [facility].  GEN: No fevers, chills. Nontoxic. Primarily MSK c/o today. MSK: Detailed in the HPI GI: tolerating PO intake without difficulty Neuro: No numbness, parasthesias, or tingling associated. Otherwise the pertinent positives of the ROS are noted above.   Objective:   BP 140/80   Pulse 73   Temp (!) 97.5 F (36.4 C) (Oral)   Ht 5\' 3"  (1.6 m)   Wt 179 lb 8 oz (81.4 kg)   LMP 08/26/1969   BMI 31.80 kg/m    GEN: WDWN,  NAD, Non-toxic, Alert & Oriented x 3 HEENT: Atraumatic, Normocephalic.  Ears and Nose: No external deformity. EXTR: No clubbing/cyanosis/edema NEURO: Normal gait.  PSYCH: Normally interactive. Conversant. Not depressed or anxious appearing.  Calm demeanor.    Right knee: Patient lacks 2 degrees of extension.  Lection to 95 degrees.  She has pain along the medial joint line, greater than the lateral joint line.  She also has some pain at the peds anserine bursa.  Stable MCL and LCL.  Lachman is negative.  PCL is intact.  Flexion causes pain.  Radiology: Dg Knee 4 Views W/patella Right  Result Date: 12/01/2017 CLINICAL DATA:  RIGHT knee pain for over 1 year EXAM: RIGHT KNEE - COMPLETE 4+ VIEW COMPARISON:  None FINDINGS: Osseous demineralization. Tricompartmental osteoarthritic changes with joint space narrowing and spur formation. No acute fracture, dislocation, or bone destruction. Bone-on-bone appearance at medial compartment. No knee joint effusion. Comparison AP view of the LEFT knee demonstrates significant degenerative changes at medial compartment as well. IMPRESSION: Tricompartmental osteoarthritic changes of the RIGHT knee. Additional osteoarthritic changes medial compartment LEFT knee. Electronically Signed   By: Ulyses Southward M.D.   On: 12/01/2017 12:37     Assessment and Plan:   Primary osteoarthritis of right knee - Plan: DG Knee 4 Views W/Patella Right, methylPREDNISolone acetate (DEPO-MEDROL) injection 80 mg  Tricompartmental osteoarthritis of the right knee with advanced medial and patellofemoral compartmental osteoarthritis.  Continue with activity as able, NSAIDs, Tylenol, topical medication, as well as tumor.  We will inject her knee today with corticosteroid and see if this can calm this down.  If this does not significantly help the situation, Viscosupplementation would be reasonable.  Knee Injection, R Patient verbally consented to procedure. Risks (including potential rare  risk of infection), benefits, and alternatives explained. Sterilely prepped with Chloraprep. Ethyl cholride used for anesthesia. 8 cc Lidocaine 1% mixed with 2  mL Depo-Medrol 40 mg injected using the anteromedial approach without difficulty. No complications with procedure and tolerated well. Patient had decreased pain post-injection.   Follow-up: No follow-ups on file.  Meds ordered this encounter  Medications  . methylPREDNISolone acetate (DEPO-MEDROL) injection 80 mg   Orders Placed This Encounter  Procedures  . DG Knee 4 Views W/Patella Right    Signed,  Karleen Hampshire T. Kalyan Barabas, MD   Allergies as of 12/01/2017      Reactions   Shellfish Allergy Anaphylaxis, Nausea And Vomiting   Tetracycline Hives, Nausea And Vomiting, Rash, Other (See Comments)   SYSTEMIC REACTION: heart palpitations,muscle spasms, vomiting   Amlodipine Besylate Other (See Comments)   REACTION: muscle spasms, extremity swelling, low bp   Ciprofloxacin Other (See Comments)   REACTION: muscle spasms, insomnia   Codeine Other (See Comments)   REACTION: hallucinations   Propoxyphene Hcl Other (See Comments)   Unknown   Sulfamethoxazole-trimethoprim Nausea Only, Other (See Comments)   REACTION: dizzy, could not focus eyes and could not concentrate and nausea   Tape Other (See Comments)   BANDAIDS TAKE OFF HER SKIN; PLEASE USE COBAN OR PAPER    Xarelto [rivaroxaban] Other (See Comments)   Aches and pains and nervousness   Amoxicillin Rash   Other Swelling, Rash, Other (See Comments)   Has autoimmune disorder and spices erode her salivary glands and affects ankles and mouth DIRECTLY (takes off 1st layer of skin and leaves her unable to walk)   Penicillins Swelling, Rash, Other (See Comments)   Has patient had a PCN reaction causing immediate rash, facial/tongue/throat swelling, SOB or lightheadedness with hypotension: Yes Has patient had a PCN reaction causing severe rash involving mucus membranes or skin necrosis:  No Has patient had a PCN reaction that required hospitalization No Has patient had a PCN reaction occurring within the last 10 years: No If all of the above answers are "NO", then may proceed with Cephalosporin use.      Medication List        Accurate as of 12/01/17  2:00 PM. Always use your most recent med list.          AZO-CRANBERRY PO Take 1 capsule by mouth 2 (two) times daily.   Biotin 10 MG Tabs Take 1 tablet by mouth every morning.   CALCIUM CITRATE + D 250-200 MG-UNIT Tabs Generic drug:  Calcium Citrate-Vitamin D Take 1 tablet by mouth daily.   cholecalciferol 1000 units tablet Commonly known as:  VITAMIN D Take 1,000 Units by mouth daily.   cyclobenzaprine 10 MG tablet Commonly known as:  FLEXERIL Take 1 tablet (10 mg total) by mouth at bedtime as needed.   estradiol 1 MG tablet Commonly known as:  ESTRACE Take 1 tablet (1 mg total) by mouth daily.   fluticasone 50 MCG/ACT nasal spray Commonly known as:  FLONASE Place 2 sprays into both nostrils daily as needed for allergies.   folic acid 1 MG tablet Commonly known as:  FOLVITE Take 1 mg by mouth daily.   ibuprofen 200 MG tablet Commonly known as:  ADVIL,MOTRIN Take 400 mg by mouth at bedtime. For leg pain/discomfort   levothyroxine 88 MCG tablet Commonly known as:  SYNTHROID, LEVOTHROID Take 1 tablet (88 mcg total) by mouth daily with breakfast.   loratadine 10 MG tablet Commonly known as:  CLARITIN Take 10 mg by mouth daily as needed for allergies.   MAGNESIUM-OXIDE PO Take 1 tablet by mouth daily.   montelukast 10 MG tablet Commonly  known as:  SINGULAIR Take 1 tablet (10 mg total) by mouth at bedtime.   MUCINEX ALLERGY PO Take 1 tablet by mouth daily as needed (Allergies).   multivitamin capsule Take 1 capsule by mouth daily.   TURMERIC PO Take 1 capsule by mouth 2 (two) times daily.   VITAMIN B COMPLEX PO Take 1 tablet by mouth daily.   vitamin B-12 500 MCG tablet Commonly known  as:  CYANOCOBALAMIN Take 500 mcg by mouth daily.   vitamin C 1000 MG tablet Take 1,000 mg by mouth daily.

## 2017-12-08 ENCOUNTER — Ambulatory Visit
Admission: RE | Admit: 2017-12-08 | Discharge: 2017-12-08 | Disposition: A | Discharge status: Home / Self Care | Payer: Medicare Other | Source: Ambulatory Visit | Attending: Family Medicine | Admitting: Family Medicine

## 2017-12-08 DIAGNOSIS — Z1231 Encounter for screening mammogram for malignant neoplasm of breast: Secondary | ICD-10-CM

## 2017-12-30 ENCOUNTER — Other Ambulatory Visit: Payer: Self-pay | Admitting: Family Medicine

## 2018-02-06 ENCOUNTER — Encounter: Payer: Self-pay | Admitting: Family Medicine

## 2018-02-06 ENCOUNTER — Ambulatory Visit (INDEPENDENT_AMBULATORY_CARE_PROVIDER_SITE_OTHER): Payer: Medicare Other | Admitting: Family Medicine

## 2018-02-06 DIAGNOSIS — S60861A Insect bite (nonvenomous) of right wrist, initial encounter: Secondary | ICD-10-CM | POA: Diagnosis not present

## 2018-02-06 DIAGNOSIS — W57XXXA Bitten or stung by nonvenomous insect and other nonvenomous arthropods, initial encounter: Secondary | ICD-10-CM | POA: Diagnosis not present

## 2018-02-06 NOTE — Patient Instructions (Signed)
I don't think you have a tick disease since it wasn't engorged.   Use neosporin in the meantime.   If worse over the weekend, then please get rechecked to consider antibiotic options.  Take care.  Glad to see you.

## 2018-02-06 NOTE — Progress Notes (Signed)
Tick bite on the R wrist near distal radius.  Not engorged, removed.  Likely not a long star tick on exam of the tick- doesn't have a typical white dot- she brought the tick in and I examined it under magnification and compared to representative pictures. Removed 3 days ago.  Locally tender last night but not as much now.  No fevers.  No other rash.    She has multiple med intolerances.  Discussed.  Meds, vitals, and allergies reviewed.   ROS: Per HPI unless specifically indicated in ROS section   nad ncat rrr ctab R wrist with local irritation near the distal radius but no ulceration or fluctuant mass.  No spreading/alarming erythema.   No bull's-eye rash.

## 2018-02-07 NOTE — Assessment & Plan Note (Signed)
Near the right wrist.  I doubt she has a tick associated disease since the tick was not engorged and it was apparently probably removed.  Okay for outpatient follow-up.  She looks like she only has some local irritation.  She can use Benadryl cream if needed and/or Neosporin ointment on the area locally.  She has multiple med intolerances.  If she has other concerning symptoms in the meantime that I want her to get rechecked.  She agrees.  Okay for outpatient follow-up.

## 2018-05-08 ENCOUNTER — Ambulatory Visit (INDEPENDENT_AMBULATORY_CARE_PROVIDER_SITE_OTHER): Payer: Medicare Other | Admitting: Family Medicine

## 2018-05-08 ENCOUNTER — Encounter: Payer: Self-pay | Admitting: Family Medicine

## 2018-05-08 VITALS — BP 140/80 | HR 97 | Temp 97.4°F | Ht 63.0 in | Wt 182.5 lb

## 2018-05-08 DIAGNOSIS — M545 Low back pain: Secondary | ICD-10-CM

## 2018-05-08 DIAGNOSIS — G8929 Other chronic pain: Secondary | ICD-10-CM

## 2018-05-08 DIAGNOSIS — Z23 Encounter for immunization: Secondary | ICD-10-CM

## 2018-05-08 MED ORDER — CYCLOBENZAPRINE HCL 10 MG PO TABS: 5.0000 mg | ORAL_TABLET | Freq: Every evening | ORAL | 1 refills | Status: DC | PRN

## 2018-05-08 NOTE — Patient Instructions (Addendum)
We will refer you to PT  If this does not help please follow up with Dr Ronnald Ramp   Take flexeril as needed with caution of sedation  If you take aleve- take it with a meal so it does not bother your stomach   Make sure to drink lots of water   Blood pressure is borderline- watch sodium intake as well

## 2018-05-08 NOTE — Progress Notes (Signed)
Subjective:    Patient ID: Linda Mcgrath, female    DOB: February 12, 1933, 84 y.o.   MRN: [id number]  HPI Here with back pain   She had DLL/fusion  (much bigger surgery than anticipated)  Dr Marikay Alar  It helped but it has taken a very long time to get over it  Recovery was very painful  Once she can get up - does better   R low back pain  Has to walk with a cane  Hard to get out of pain  Thinks she needs some PT again    10 d ago- she twisted and started muscle spasms  Did not have anything on hand to use except aleve bid and also topical asper creme with lidocaine  Also some heat  No radiation to either leg  No weakness or numbness    Also wants a flu shot    Patient Active Problem List   Diagnosis Date Noted  . S/P lumbar spinal fusion 07/31/2017  . Right low back pain 03/04/2017  . Arthropod bite 01/28/2017  . Atrial flutter (HCC) 03/06/2016  . Obesity 11/06/2015  . Urge incontinence 01/24/2015  . Estrogen deficiency 10/25/2014  . Lichen planus 12/20/2013  . Encounter for Medicare annual wellness exam 06/17/2013  . Screening mammogram, encounter for 06/15/2012  . Hyperglycemia 06/03/2011  . HYPERTENSION, BENIGN ESSENTIAL 10/23/2010  . CONSTIPATION 10/23/2010  . HEMATOCHEZIA 10/23/2010  . OSTEOARTHRITIS, HANDS, BILATERAL 01/24/2010  . Hypothyroidism 08/24/2008  . Vitamin D deficiency 06/06/2008  . Hyperlipidemia 06/06/2008  . MENOPAUSAL SYNDROME 03/29/2008  . Osteopenia 03/29/2008  . ALLERGIC RHINITIS 01/25/2008  . IBS 01/25/2008  . OSTEOARTHRITIS 01/25/2008  . FIBROMYALGIA 01/25/2008   Past Medical History:  Diagnosis Date  . Allergic rhinitis   . Asthma   . Cataract    Bil/lens implant  . Colon polyp 1999   small polyp  . Diverticulosis   . Dysrhythmia   . Fibromyalgia   . Hyperlipidemia   . Hypothyroidism   . Internal hemorrhoids   . Leg cramps   . Lung nodule    stable LUL 9 mm  . Menopausal syndrome   . Osteoarthritis   . UTI  (urinary tract infection)    Past Surgical History:  Procedure Laterality Date  . Abd U/S  11/1998   negative  . Abd U/S  01/2001   gallbladder polyps  . ABDOMINAL HYSTERECTOMY  1975   total-fibroids  . APPENDECTOMY  1975  . BREAST BIOPSY     benign  . BREAST EXCISIONAL BIOPSY Left 1957  . CARDIOVERSION N/A 03/07/2016   Procedure: CARDIOVERSION;  Surgeon: Yates Decamp, MD;  Location: [facility] ENDOSCOPY;  Service: Cardiovascular;  Laterality: N/A;  . CATARACT EXTRACTION     bilateral  . CHOLECYSTECTOMY    . COLONOSCOPY  1998   Diverticulosis; polyp\  . COLONOSCOPY  09/2002   Diverticulosis, hem  . COLONOSCOPY  12/2007   diverticulosis, polyp  . DEXA  10/2001   osteopenia  . ELECTROPHYSIOLOGIC STUDY N/A 04/03/2016   Procedure: A-Flutter Ablation;  Surgeon: Will [name], MD;  Location: [facility] INVASIVE CV LAB;  Service: Cardiovascular;  Laterality: N/A;  . ESOPHAGOGASTRODUODENOSCOPY  2004  . Hida scan  01/2001   Negative  . KNEE SURGERY     right knee / 11/2004  . LAMINECTOMY WITH POSTERIOR LATERAL ARTHRODESIS LEVEL 2 N/A 07/31/2017   Procedure: DECOMPRESSIVE LAMINECTOMY LUMABR FOUR-FIVE, LUMBAR FIVE-SACRAL ONE;  Surgeon: Tia Alert, MD;  Location: [facility] OR;  Service:  Neurosurgery;  Laterality: N/A;  . laser surgery for glaucoma Left Sep 21, 2014  . ROTATOR CUFF REPAIR     x 2 /right shoulder  . SKIN CANCER EXCISION     pre-melanoma / on face  . TEE WITHOUT CARDIOVERSION N/A 03/07/2016   Procedure: TRANSESOPHAGEAL ECHOCARDIOGRAM (TEE);  Surgeon: Yates Decamp, MD;  Location: [facility] ENDOSCOPY;  Service: Cardiovascular;  Laterality: N/A;  . TOE SURGERY     rt foot    Social History   Tobacco Use  . Smoking status: Never Smoker  . Smokeless tobacco: Never Used  Substance Use Topics  . Alcohol use: No    Alcohol/week: 0.0 standard drinks    Comment: occasional-rare  . Drug use: No   Family History  Problem Relation Age of Onset  . Parkinsonism Father   . Colon cancer Brother   .  Allergies Mother   . Heart disease Mother   . Kidney failure Son        congenital   Allergies  Allergen Reactions  . Shellfish Allergy Anaphylaxis and Nausea And Vomiting  . Tetracycline Hives, Nausea And Vomiting, Rash and Other (See Comments)    SYSTEMIC REACTION: heart palpitations,muscle spasms, vomiting  . Amlodipine Besylate Other (See Comments)    REACTION: muscle spasms, extremity swelling, low bp  . Ciprofloxacin Other (See Comments)    REACTION: muscle spasms, insomnia  . Codeine Other (See Comments)    REACTION: hallucinations  . Propoxyphene Hcl Other (See Comments)    Unknown  . Sulfamethoxazole-Trimethoprim Nausea Only and Other (See Comments)    REACTION: dizzy, could not focus eyes and could not concentrate and nausea  . Tape Other (See Comments)    BANDAIDS TAKE OFF HER SKIN; PLEASE USE COBAN OR PAPER   . Xarelto [Rivaroxaban] Other (See Comments)    Aches and pains and nervousness  . Amoxicillin Rash  . 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 Penicillins Swelling, Rash and Other (See Comments)    Has patient had a PCN reaction causing immediate rash, facial/tongue/throat swelling, SOB or lightheadedness with hypotension: Yes Has patient had a PCN reaction causing severe rash involving mucus membranes or skin necrosis: No Has patient had a PCN reaction that required hospitalization No Has patient had a PCN reaction occurring within the last 10 years: No If all of the above answers are "NO", then may proceed with Cephalosporin use.    Current Outpatient Medications on File Prior to Visit  Medication Sig Dispense Refill  . Ascorbic Acid (VITAMIN C) 1000 MG tablet Take 1,000 mg by mouth daily.      . AZO-CRANBERRY PO Take 1 capsule by mouth 2 (two) times daily.    . B Complex Vitamins (VITAMIN B  COMPLEX PO) Take 1 tablet by mouth daily.    . Biotin 10 MG TABS Take 1 tablet by mouth every morning.     . Calcium Citrate-Vitamin D (CALCIUM CITRATE + D) 250-200 MG-UNIT TABS Take 1 tablet by mouth daily.    . cholecalciferol (VITAMIN D) 1000 units tablet Take 1,000 Units by mouth daily.    Marland Kitchen estradiol (ESTRACE) 1 MG tablet Take 1 tablet (1 mg total) by mouth daily. 90 tablet 3  . Fexofenadine HCl (MUCINEX ALLERGY PO) Take 1 tablet by mouth daily as needed (Allergies).     Marland Kitchen  fluticasone (FLONASE) 50 MCG/ACT nasal spray PLACE 2 SPRAYS INTO BOTH NOSTRILS DAILY AS NEEDED FOR ALLERGIES. 16 g 2  . folic acid (FOLVITE) 1 MG tablet Take 1 mg by mouth daily.    . Homeopathic Products (LEG CRAMPS PM SL) Place 2 tablets under the tongue at bedtime.    Marland Kitchen levothyroxine (SYNTHROID, LEVOTHROID) 88 MCG tablet Take 1 tablet (88 mcg total) by mouth daily with breakfast. 90 tablet 3  . loratadine (CLARITIN) 10 MG tablet Take 10 mg by mouth daily as needed for allergies.     Marland Kitchen MAGNESIUM-OXIDE PO Take 1 tablet by mouth daily.    . montelukast (SINGULAIR) 10 MG tablet Take 1 tablet (10 mg total) by mouth at bedtime. 90 tablet 3  . Multiple Vitamin (MULTIVITAMIN) capsule Take 1 capsule by mouth daily.      . TURMERIC PO Take 1 capsule by mouth 2 (two) times daily.     . vitamin B-12 (CYANOCOBALAMIN) 500 MCG tablet Take 500 mcg by mouth daily.     No current facility-administered medications on file prior to visit.     Review of Systems  Constitutional: Negative for activity change, appetite change, fatigue, fever and unexpected weight change.  HENT: Negative for congestion, ear pain, rhinorrhea, sinus pressure and sore throat.   Eyes: Negative for pain, redness and visual disturbance.  Respiratory: Negative for cough, shortness of breath and wheezing.   Cardiovascular: Negative for chest pain and palpitations.  Gastrointestinal: Negative for abdominal pain, blood in stool, constipation and diarrhea.    Endocrine: Negative for polydipsia and polyuria.  Genitourinary: Negative for dysuria, frequency and urgency.  Musculoskeletal: Positive for back pain and gait problem. Negative for arthralgias and myalgias.  Skin: Negative for pallor and rash.  Allergic/Immunologic: Negative for environmental allergies.  Neurological: Negative for dizziness, syncope and headaches.  Hematological: Negative for adenopathy. Does not bruise/bleed easily.  Psychiatric/Behavioral: Negative for decreased concentration and dysphoric mood. The patient is not nervous/anxious.        Objective:   Physical Exam  Constitutional: She appears well-developed and well-nourished. No distress.  obese and well appearing   HENT:  Head: Normocephalic and atraumatic.  Eyes: Pupils are equal, round, and reactive to light. Conjunctivae and EOM are normal. No scleral icterus.  Neck: Normal range of motion. Neck supple.  Cardiovascular: Normal rate and regular rhythm.  Pulmonary/Chest: Effort normal and breath sounds normal. She has no wheezes. She has no rales.  Abdominal: Soft. Bowel sounds are normal. She exhibits no distension. There is no tenderness.  Musculoskeletal: She exhibits tenderness.       Right shoulder: She exhibits decreased range of motion, tenderness, pain and spasm. She exhibits no bony tenderness, no swelling, no crepitus, no deformity, normal pulse and normal strength.       Lumbar back: She exhibits decreased range of motion, tenderness and spasm. She exhibits no bony tenderness and no edema.  Midline lumbar scar noted  Tender in R lumbar musculature and slt in piriformis area  Nl rom hips  Lumbar flex - 45 deg/ext none  Lateral bend- limited  Gait is slow and steady   No neuro changes   Lymphadenopathy:    She has no cervical adenopathy.  Neurological: She is alert. She has normal strength and normal reflexes. She displays no atrophy and normal reflexes. No cranial nerve deficit or sensory deficit.  She exhibits normal muscle tone. Coordination normal.  Negative SLR  Skin: Skin is warm and dry. No rash noted. No  erythema. No pallor.  Psychiatric: She has a normal mood and affect.          Assessment & Plan:   Problem List Items Addressed This Visit      Other   Right low back pain - Primary    After very slow recovery from LS fusion  R sided pain /spasm w/o neuro change  Reassuring exam  Aleve bid with food prn  Flexeril qhs -caution of sedation  Heat/stretch as tol Ref to PT eval and tx  If worse or no imp will need f/u with surgeon      Relevant Medications   cyclobenzaprine (FLEXERIL) 10 MG tablet   Other Relevant Orders   Ambulatory referral to Physical Therapy    Other Visit Diagnoses    Need for influenza vaccination       Relevant Orders   Flu Vaccine QUAD 6+ mos PF IM (Fluarix Quad PF) (Completed)

## 2018-05-09 NOTE — Assessment & Plan Note (Signed)
After very slow recovery from LS fusion  R sided pain /spasm w/o neuro change  Reassuring exam  Aleve bid with food prn  Flexeril qhs -caution of sedation  Heat/stretch as tol Ref to PT eval and tx  If worse or no imp will need f/u with surgeon

## 2018-05-20 ENCOUNTER — Encounter: Payer: Self-pay | Admitting: Physical Therapy

## 2018-05-20 ENCOUNTER — Ambulatory Visit: Payer: Medicare Other | Attending: Family Medicine | Admitting: Physical Therapy

## 2018-05-20 ENCOUNTER — Other Ambulatory Visit: Payer: Self-pay

## 2018-05-20 DIAGNOSIS — M5441 Lumbago with sciatica, right side: Secondary | ICD-10-CM

## 2018-05-20 DIAGNOSIS — R262 Difficulty in walking, not elsewhere classified: Secondary | ICD-10-CM | POA: Diagnosis not present

## 2018-05-20 DIAGNOSIS — M62830 Muscle spasm of back: Secondary | ICD-10-CM

## 2018-05-20 NOTE — Therapy (Signed)
[place], [place], [address] Phone: [phone]   Fax:  [phone]  Physical Therapy Evaluation  Patient Details  Name: Linda Mcgrath MRN: [id number] Date of Birth: August 18, 1933 Referring Provider: [name]   Encounter Date: 05/20/2018  PT End of Session - 05/20/18 1319    Visit Number  1    Date for PT Re-Evaluation  07/20/18    PT Start Time  1300    PT Stop Time  0258    PT Time Calculation (min)  55 min    Activity Tolerance  Patient tolerated treatment well    Behavior During Therapy  [facility] for tasks assessed/performed       Past Medical History:  Diagnosis Date  . Allergic rhinitis   . Asthma   . Cataract    Bil/lens implant  . Colon polyp 1999   small polyp  . Diverticulosis   . Dysrhythmia   . Fibromyalgia   . Hyperlipidemia   . Hypothyroidism   . Internal hemorrhoids   . Leg cramps   . Lung nodule    stable LUL 9 mm  . Menopausal syndrome   . Osteoarthritis   . UTI (urinary tract infection)     Past Surgical History:  Procedure Laterality Date  . Abd U/S  11/1998   negative  . Abd U/S  01/2001   gallbladder polyps  . ABDOMINAL HYSTERECTOMY  1975   total-fibroids  . APPENDECTOMY  1975  . BREAST BIOPSY     benign  . BREAST EXCISIONAL BIOPSY Left 1957  . CARDIOVERSION N/A 03/07/2016   Procedure: CARDIOVERSION;  Surgeon: Adrian Prows, MD;  Location: [facility];  Service: Cardiovascular;  Laterality: N/A;  . CATARACT EXTRACTION     bilateral  . CHOLECYSTECTOMY    . COLONOSCOPY  1998   Diverticulosis; polyp\  . COLONOSCOPY  09/2002   Diverticulosis, hem  . COLONOSCOPY  12/2007   diverticulosis, polyp  . DEXA  10/2001   osteopenia  . ELECTROPHYSIOLOGIC STUDY N/A 04/03/2016   Procedure: A-Flutter Ablation;  Surgeon: Will Meredith Leeds, MD;  Location: [facility] CV LAB;  Service: Cardiovascular;  Laterality: N/A;  . ESOPHAGOGASTRODUODENOSCOPY  2004  . Hida scan   01/2001   Negative  . KNEE SURGERY     right knee / 11/2004  . LAMINECTOMY WITH POSTERIOR LATERAL ARTHRODESIS LEVEL 2 N/A 07/31/2017   Procedure: DECOMPRESSIVE LAMINECTOMY LUMABR FOUR-FIVE, LUMBAR FIVE-SACRAL ONE;  Surgeon: Eustace Moore, MD;  Location: [facility];  Service: Neurosurgery;  Laterality: N/A;  . laser surgery for glaucoma Left Sep 21, 2014  . ROTATOR CUFF REPAIR     x 2 /right shoulder  . SKIN CANCER EXCISION     pre-melanoma / on face  . TEE WITHOUT CARDIOVERSION N/A 03/07/2016   Procedure: TRANSESOPHAGEAL ECHOCARDIOGRAM (TEE);  Surgeon: Adrian Prows, MD;  Location: [facility];  Service: Cardiovascular;  Laterality: N/A;  . TOE SURGERY     rt foot     There were no vitals filed for this visit.   Subjective Assessment - 05/20/18 1302    Subjective  Patient was seen her for LBP about a year ago, we saw her multiple times without much results, an MRI showed severe impinged nerve, she underwent a lumbar fusion 3 levels in December.  Was in hospital 5 days and then to home, reports a lot of pain after the surgery.  She reports that over the past few months she  Next Visit Plan  slowly start some exercises to strengthen the core and get her tolerance to activity better    Consulted and Agree with Plan of Care  Patient       Patient will benefit from skilled therapeutic intervention in order to improve the following deficits and impairments:  Decreased activity tolerance, Decreased mobility, Decreased endurance, Cardiopulmonary status limiting activity, Decreased range of motion, Decreased strength, Postural dysfunction, Improper body mechanics, Impaired flexibility, Pain, Increased muscle spasms, Difficulty walking  Visit Diagnosis: Muscle spasm of back - Plan: PT plan of care cert/re-cert  Acute right-sided low back pain with right-sided sciatica - Plan: PT plan of care cert/re-cert  Difficulty in walking, not elsewhere classified - Plan: PT plan of care cert/re-cert     Problem List Patient Active Problem List   Diagnosis Date Noted  . S/P lumbar spinal fusion 07/31/2017  . Right low back pain 03/04/2017  . Arthropod bite 01/28/2017  . Atrial flutter ([place]) 03/06/2016  . Obesity 11/06/2015  . Urge incontinence 01/24/2015  . Estrogen deficiency 10/25/2014  . Lichen planus 31/49/7026  . Encounter for Medicare annual wellness exam 06/17/2013  . Screening mammogram, encounter for 06/15/2012  . Hyperglycemia 06/03/2011  . HYPERTENSION, BENIGN ESSENTIAL 10/23/2010  . CONSTIPATION 10/23/2010  . HEMATOCHEZIA 10/23/2010  . OSTEOARTHRITIS, HANDS, BILATERAL 01/24/2010  . Hypothyroidism  08/24/2008  . Vitamin D deficiency 06/06/2008  . Hyperlipidemia 06/06/2008  . MENOPAUSAL SYNDROME 03/29/2008  . Osteopenia 03/29/2008  . ALLERGIC RHINITIS 01/25/2008  . IBS 01/25/2008  . OSTEOARTHRITIS 01/25/2008  . FIBROMYALGIA 01/25/2008    [name], PT 05/20/2018, 1:33 PM  [place] [address] [address] [place], [place], [address] Phone: [phone]   Fax:  [phone]  Name: Linda Mcgrath MRN: [id number] Date of Birth: 11/27/1932  [place], [place], [address] Phone: [phone]   Fax:  [phone]  Physical Therapy Evaluation  Patient Details  Name: Linda Mcgrath MRN: [id number] Date of Birth: August 18, 1933 Referring Provider: [name]   Encounter Date: 05/20/2018  PT End of Session - 05/20/18 1319    Visit Number  1    Date for PT Re-Evaluation  07/20/18    PT Start Time  1300    PT Stop Time  0258    PT Time Calculation (min)  55 min    Activity Tolerance  Patient tolerated treatment well    Behavior During Therapy  [facility] for tasks assessed/performed       Past Medical History:  Diagnosis Date  . Allergic rhinitis   . Asthma   . Cataract    Bil/lens implant  . Colon polyp 1999   small polyp  . Diverticulosis   . Dysrhythmia   . Fibromyalgia   . Hyperlipidemia   . Hypothyroidism   . Internal hemorrhoids   . Leg cramps   . Lung nodule    stable LUL 9 mm  . Menopausal syndrome   . Osteoarthritis   . UTI (urinary tract infection)     Past Surgical History:  Procedure Laterality Date  . Abd U/S  11/1998   negative  . Abd U/S  01/2001   gallbladder polyps  . ABDOMINAL HYSTERECTOMY  1975   total-fibroids  . APPENDECTOMY  1975  . BREAST BIOPSY     benign  . BREAST EXCISIONAL BIOPSY Left 1957  . CARDIOVERSION N/A 03/07/2016   Procedure: CARDIOVERSION;  Surgeon: Adrian Prows, MD;  Location: [facility];  Service: Cardiovascular;  Laterality: N/A;  . CATARACT EXTRACTION     bilateral  . CHOLECYSTECTOMY    . COLONOSCOPY  1998   Diverticulosis; polyp\  . COLONOSCOPY  09/2002   Diverticulosis, hem  . COLONOSCOPY  12/2007   diverticulosis, polyp  . DEXA  10/2001   osteopenia  . ELECTROPHYSIOLOGIC STUDY N/A 04/03/2016   Procedure: A-Flutter Ablation;  Surgeon: Will Meredith Leeds, MD;  Location: [facility] CV LAB;  Service: Cardiovascular;  Laterality: N/A;  . ESOPHAGOGASTRODUODENOSCOPY  2004  . Hida scan   01/2001   Negative  . KNEE SURGERY     right knee / 11/2004  . LAMINECTOMY WITH POSTERIOR LATERAL ARTHRODESIS LEVEL 2 N/A 07/31/2017   Procedure: DECOMPRESSIVE LAMINECTOMY LUMABR FOUR-FIVE, LUMBAR FIVE-SACRAL ONE;  Surgeon: Eustace Moore, MD;  Location: [facility];  Service: Neurosurgery;  Laterality: N/A;  . laser surgery for glaucoma Left Sep 21, 2014  . ROTATOR CUFF REPAIR     x 2 /right shoulder  . SKIN CANCER EXCISION     pre-melanoma / on face  . TEE WITHOUT CARDIOVERSION N/A 03/07/2016   Procedure: TRANSESOPHAGEAL ECHOCARDIOGRAM (TEE);  Surgeon: Adrian Prows, MD;  Location: [facility];  Service: Cardiovascular;  Laterality: N/A;  . TOE SURGERY     rt foot     There were no vitals filed for this visit.   Subjective Assessment - 05/20/18 1302    Subjective  Patient was seen her for LBP about a year ago, we saw her multiple times without much results, an MRI showed severe impinged nerve, she underwent a lumbar fusion 3 levels in December.  Was in hospital 5 days and then to home, reports a lot of pain after the surgery.  She reports that over the past few months she  Next Visit Plan  slowly start some exercises to strengthen the core and get her tolerance to activity better    Consulted and Agree with Plan of Care  Patient       Patient will benefit from skilled therapeutic intervention in order to improve the following deficits and impairments:  Decreased activity tolerance, Decreased mobility, Decreased endurance, Cardiopulmonary status limiting activity, Decreased range of motion, Decreased strength, Postural dysfunction, Improper body mechanics, Impaired flexibility, Pain, Increased muscle spasms, Difficulty walking  Visit Diagnosis: Muscle spasm of back - Plan: PT plan of care cert/re-cert  Acute right-sided low back pain with right-sided sciatica - Plan: PT plan of care cert/re-cert  Difficulty in walking, not elsewhere classified - Plan: PT plan of care cert/re-cert     Problem List Patient Active Problem List   Diagnosis Date Noted  . S/P lumbar spinal fusion 07/31/2017  . Right low back pain 03/04/2017  . Arthropod bite 01/28/2017  . Atrial flutter ([facility]) 03/06/2016  . Obesity 11/06/2015  . Urge incontinence 01/24/2015  . Estrogen deficiency 10/25/2014  . Lichen planus 31/49/7026  . Encounter for Medicare annual wellness exam 06/17/2013  . Screening mammogram, encounter for 06/15/2012  . Hyperglycemia 06/03/2011  . HYPERTENSION, BENIGN ESSENTIAL 10/23/2010  . CONSTIPATION 10/23/2010  . HEMATOCHEZIA 10/23/2010  . OSTEOARTHRITIS, HANDS, BILATERAL 01/24/2010  . Hypothyroidism  08/24/2008  . Vitamin D deficiency 06/06/2008  . Hyperlipidemia 06/06/2008  . MENOPAUSAL SYNDROME 03/29/2008  . Osteopenia 03/29/2008  . ALLERGIC RHINITIS 01/25/2008  . IBS 01/25/2008  . OSTEOARTHRITIS 01/25/2008  . FIBROMYALGIA 01/25/2008    [name], PT 05/20/2018, 1:33 PM  [place] [address] [place], [place], [address] Phone: [phone]   Fax:  [phone]  Name: Linda Mcgrath MRN: [id number] Date of Birth: 11/27/1932

## 2018-05-25 DIAGNOSIS — H401131 Primary open-angle glaucoma, bilateral, mild stage: Secondary | ICD-10-CM | POA: Diagnosis not present

## 2018-05-26 ENCOUNTER — Ambulatory Visit: Payer: Medicare Other | Attending: Family Medicine | Admitting: Physical Therapy

## 2018-05-26 ENCOUNTER — Encounter: Payer: Self-pay | Admitting: Physical Therapy

## 2018-05-26 DIAGNOSIS — M62830 Muscle spasm of back: Secondary | ICD-10-CM | POA: Diagnosis not present

## 2018-05-26 DIAGNOSIS — M545 Low back pain, unspecified: Secondary | ICD-10-CM

## 2018-05-26 DIAGNOSIS — R262 Difficulty in walking, not elsewhere classified: Secondary | ICD-10-CM | POA: Insufficient documentation

## 2018-05-26 DIAGNOSIS — M5441 Lumbago with sciatica, right side: Secondary | ICD-10-CM | POA: Diagnosis not present

## 2018-05-26 NOTE — Therapy (Signed)
[place], [place], [address] Phone: [phone]   Fax:  [phone]  Physical Therapy Treatment  Patient Details  Name: Linda Mcgrath MRN: [id number] Date of Birth: May 01, 1933 Referring Provider (PT): [name]   Encounter Date: 05/26/2018  PT End of Session - 05/26/18 1526    Visit Number  2    Date for PT Re-Evaluation  07/20/18    PT Start Time  1440    PT Stop Time  1530    PT Time Calculation (min)  50 min    Activity Tolerance  Patient tolerated treatment well    Behavior During Therapy  [facility] for tasks assessed/performed       Past Medical History:  Diagnosis Date  . Allergic rhinitis   . Asthma   . Cataract    Bil/lens implant  . Colon polyp 1999   small polyp  . Diverticulosis   . Dysrhythmia   . Fibromyalgia   . Hyperlipidemia   . Hypothyroidism   . Internal hemorrhoids   . Leg cramps   . Lung nodule    stable LUL 9 mm  . Menopausal syndrome   . Osteoarthritis   . UTI (urinary tract infection)     Past Surgical History:  Procedure Laterality Date  . Abd U/S  11/1998   negative  . Abd U/S  01/2001   gallbladder polyps  . ABDOMINAL HYSTERECTOMY  1975   total-fibroids  . APPENDECTOMY  1975  . BREAST BIOPSY     benign  . BREAST EXCISIONAL BIOPSY Left 1957  . CARDIOVERSION N/A 03/07/2016   Procedure: CARDIOVERSION;  Surgeon: Adrian Prows, MD;  Location: [place];  Service: Cardiovascular;  Laterality: N/A;  . CATARACT EXTRACTION     bilateral  . CHOLECYSTECTOMY    . COLONOSCOPY  1998   Diverticulosis; polyp_0  . COLONOSCOPY  09/2002   Diverticulosis, hem  . COLONOSCOPY  12/2007   diverticulosis, polyp  . DEXA  10/2001   osteopenia  . ELECTROPHYSIOLOGIC STUDY N/A 04/03/2016   Procedure: A-Flutter Ablation;  Surgeon: Will Meredith Leeds, MD;  Location: [facility];  Service: Cardiovascular;  Laterality: N/A;  . ESOPHAGOGASTRODUODENOSCOPY  2004  . [place]  scan  01/2001   Negative  . KNEE SURGERY     right knee / 11/2004  . LAMINECTOMY WITH POSTERIOR LATERAL ARTHRODESIS LEVEL 2 N/A 07/31/2017   Procedure: DECOMPRESSIVE LAMINECTOMY LUMABR FOUR-FIVE, LUMBAR FIVE-SACRAL ONE;  Surgeon: Eustace Moore, MD;  Location: [place];  Service: Neurosurgery;  Laterality: N/A;  . laser surgery for glaucoma Left Sep 21, 2014  . ROTATOR CUFF REPAIR     x 2 /right shoulder  . SKIN CANCER EXCISION     pre-melanoma / on face  . TEE WITHOUT CARDIOVERSION N/A 03/07/2016   Procedure: TRANSESOPHAGEAL ECHOCARDIOGRAM (TEE);  Surgeon: Adrian Prows, MD;  Location: [facility];  Service: Cardiovascular;  Laterality: N/A;  . TOE SURGERY     rt foot     There were no vitals filed for this visit.  Subjective Assessment - 05/26/18 1441    Subjective  Patient reports that she is pretty sore.    Currently in Pain?  Yes    Pain Score  5     Pain Location  Back    Pain Orientation  Right;Lower                       [facility]  [place], [place], [address] Phone: [phone]   Fax:  [phone]  Physical Therapy Treatment  Patient Details  Name: Linda Mcgrath MRN: [id number] Date of Birth: May 01, 1933 Referring Provider (PT): [name]   Encounter Date: 05/26/2018  PT End of Session - 05/26/18 1526    Visit Number  2    Date for PT Re-Evaluation  07/20/18    PT Start Time  1440    PT Stop Time  1530    PT Time Calculation (min)  50 min    Activity Tolerance  Patient tolerated treatment well    Behavior During Therapy  [facility] for tasks assessed/performed       Past Medical History:  Diagnosis Date  . Allergic rhinitis   . Asthma   . Cataract    Bil/lens implant  . Colon polyp 1999   small polyp  . Diverticulosis   . Dysrhythmia   . Fibromyalgia   . Hyperlipidemia   . Hypothyroidism   . Internal hemorrhoids   . Leg cramps   . Lung nodule    stable LUL 9 mm  . Menopausal syndrome   . Osteoarthritis   . UTI (urinary tract infection)     Past Surgical History:  Procedure Laterality Date  . Abd U/S  11/1998   negative  . Abd U/S  01/2001   gallbladder polyps  . ABDOMINAL HYSTERECTOMY  1975   total-fibroids  . APPENDECTOMY  1975  . BREAST BIOPSY     benign  . BREAST EXCISIONAL BIOPSY Left 1957  . CARDIOVERSION N/A 03/07/2016   Procedure: CARDIOVERSION;  Surgeon: Adrian Prows, MD;  Location: [facility];  Service: Cardiovascular;  Laterality: N/A;  . CATARACT EXTRACTION     bilateral  . CHOLECYSTECTOMY    . COLONOSCOPY  1998   Diverticulosis; polyp_0  . COLONOSCOPY  09/2002   Diverticulosis, hem  . COLONOSCOPY  12/2007   diverticulosis, polyp  . DEXA  10/2001   osteopenia  . ELECTROPHYSIOLOGIC STUDY N/A 04/03/2016   Procedure: A-Flutter Ablation;  Surgeon: Will Meredith Leeds, MD;  Location: [place];  Service: Cardiovascular;  Laterality: N/A;  . ESOPHAGOGASTRODUODENOSCOPY  2004  . [place]  scan  01/2001   Negative  . KNEE SURGERY     right knee / 11/2004  . LAMINECTOMY WITH POSTERIOR LATERAL ARTHRODESIS LEVEL 2 N/A 07/31/2017   Procedure: DECOMPRESSIVE LAMINECTOMY LUMABR FOUR-FIVE, LUMBAR FIVE-SACRAL ONE;  Surgeon: Eustace Moore, MD;  Location: [facility];  Service: Neurosurgery;  Laterality: N/A;  . laser surgery for glaucoma Left Sep 21, 2014  . ROTATOR CUFF REPAIR     x 2 /right shoulder  . SKIN CANCER EXCISION     pre-melanoma / on face  . TEE WITHOUT CARDIOVERSION N/A 03/07/2016   Procedure: TRANSESOPHAGEAL ECHOCARDIOGRAM (TEE);  Surgeon: Adrian Prows, MD;  Location: [facility];  Service: Cardiovascular;  Laterality: N/A;  . TOE SURGERY     rt foot     There were no vitals filed for this visit.  Subjective Assessment - 05/26/18 1441    Subjective  Patient reports that she is pretty sore.    Currently in Pain?  Yes    Pain Score  5     Pain Location  Back    Pain Orientation  Right;Lower                       [facility]  [place], [place], [address] Phone: [phone]   Fax:  [phone]  Physical Therapy Treatment  Patient Details  Name: Linda Mcgrath MRN: [id number] Date of Birth: May 01, 1933 Referring Provider (PT): [name]   Encounter Date: 05/26/2018  PT End of Session - 05/26/18 1526    Visit Number  2    Date for PT Re-Evaluation  07/20/18    PT Start Time  1440    PT Stop Time  1530    PT Time Calculation (min)  50 min    Activity Tolerance  Patient tolerated treatment well    Behavior During Therapy  [facility] for tasks assessed/performed       Past Medical History:  Diagnosis Date  . Allergic rhinitis   . Asthma   . Cataract    Bil/lens implant  . Colon polyp 1999   small polyp  . Diverticulosis   . Dysrhythmia   . Fibromyalgia   . Hyperlipidemia   . Hypothyroidism   . Internal hemorrhoids   . Leg cramps   . Lung nodule    stable LUL 9 mm  . Menopausal syndrome   . Osteoarthritis   . UTI (urinary tract infection)     Past Surgical History:  Procedure Laterality Date  . Abd U/S  11/1998   negative  . Abd U/S  01/2001   gallbladder polyps  . ABDOMINAL HYSTERECTOMY  1975   total-fibroids  . APPENDECTOMY  1975  . BREAST BIOPSY     benign  . BREAST EXCISIONAL BIOPSY Left 1957  . CARDIOVERSION N/A 03/07/2016   Procedure: CARDIOVERSION;  Surgeon: Adrian Prows, MD;  Location: [facility];  Service: Cardiovascular;  Laterality: N/A;  . CATARACT EXTRACTION     bilateral  . CHOLECYSTECTOMY    . COLONOSCOPY  1998   Diverticulosis; polyp_0  . COLONOSCOPY  09/2002   Diverticulosis, hem  . COLONOSCOPY  12/2007   diverticulosis, polyp  . DEXA  10/2001   osteopenia  . ELECTROPHYSIOLOGIC STUDY N/A 04/03/2016   Procedure: A-Flutter Ablation;  Surgeon: Will Meredith Leeds, MD;  Location: [facility];  Service: Cardiovascular;  Laterality: N/A;  . ESOPHAGOGASTRODUODENOSCOPY  2004  . [place]  scan  01/2001   Negative  . KNEE SURGERY     right knee / 11/2004  . LAMINECTOMY WITH POSTERIOR LATERAL ARTHRODESIS LEVEL 2 N/A 07/31/2017   Procedure: DECOMPRESSIVE LAMINECTOMY LUMABR FOUR-FIVE, LUMBAR FIVE-SACRAL ONE;  Surgeon: Eustace Moore, MD;  Location: [facility];  Service: Neurosurgery;  Laterality: N/A;  . laser surgery for glaucoma Left Sep 21, 2014  . ROTATOR CUFF REPAIR     x 2 /right shoulder  . SKIN CANCER EXCISION     pre-melanoma / on face  . TEE WITHOUT CARDIOVERSION N/A 03/07/2016   Procedure: TRANSESOPHAGEAL ECHOCARDIOGRAM (TEE);  Surgeon: Adrian Prows, MD;  Location: [facility];  Service: Cardiovascular;  Laterality: N/A;  . TOE SURGERY     rt foot     There were no vitals filed for this visit.  Subjective Assessment - 05/26/18 1441    Subjective  Patient reports that she is pretty sore.    Currently in Pain?  Yes    Pain Score  5     Pain Location  Back    Pain Orientation  Right;Lower                       [facility]

## 2018-05-28 ENCOUNTER — Ambulatory Visit: Payer: Medicare Other | Admitting: Physical Therapy

## 2018-05-28 DIAGNOSIS — R262 Difficulty in walking, not elsewhere classified: Secondary | ICD-10-CM

## 2018-05-28 DIAGNOSIS — M62830 Muscle spasm of back: Secondary | ICD-10-CM | POA: Diagnosis not present

## 2018-05-28 DIAGNOSIS — M545 Low back pain, unspecified: Secondary | ICD-10-CM

## 2018-05-28 DIAGNOSIS — M5441 Lumbago with sciatica, right side: Secondary | ICD-10-CM | POA: Diagnosis not present

## 2018-05-28 NOTE — Therapy (Signed)
Patient Active Problem List   Diagnosis Date Noted  . S/P lumbar spinal fusion 07/31/2017  .  Right low back pain 03/04/2017  . Arthropod bite 01/28/2017  . Atrial flutter ([place]) 03/06/2016  . Obesity 11/06/2015  . Urge incontinence 01/24/2015  . Estrogen deficiency 10/25/2014  . Lichen planus 95/74/7340  . Encounter for Medicare annual wellness exam 06/17/2013  . Screening mammogram, encounter for 06/15/2012  . Hyperglycemia 06/03/2011  . HYPERTENSION, BENIGN ESSENTIAL 10/23/2010  . CONSTIPATION 10/23/2010  . HEMATOCHEZIA 10/23/2010  . OSTEOARTHRITIS, HANDS, BILATERAL 01/24/2010  . Hypothyroidism 08/24/2008  . Vitamin D deficiency 06/06/2008  . Hyperlipidemia 06/06/2008  . MENOPAUSAL SYNDROME 03/29/2008  . Osteopenia 03/29/2008  . ALLERGIC RHINITIS 01/25/2008  . IBS 01/25/2008  . OSTEOARTHRITIS 01/25/2008  . FIBROMYALGIA 01/25/2008    PAYSEUR,ANGIE PTA 05/28/2018, 2:23 PM  [place], [place], [address] Phone: [phone]   Fax:  [phone]  Name: Linda Mcgrath MRN: [id number] Date of Birth: 01/03/1933  [place], [place], [address] Phone: [phone]   Fax:  [phone]  Physical Therapy Treatment  Patient Details  Name: Linda Mcgrath MRN: [id number] Date of Birth: 09-Oct-1932 Referring Provider (PT): [name]   Encounter Date: 05/28/2018  PT End of Session - 05/28/18 1421    Visit Number  3    Date for PT Re-Evaluation  07/20/18    PT Start Time  1350    PT Stop Time  1440    PT Time Calculation (min)  50 min       Past Medical History:  Diagnosis Date  . Allergic rhinitis   . Asthma   . Cataract    Bil/lens implant  . Colon polyp 1999   small polyp  . Diverticulosis   . Dysrhythmia   . Fibromyalgia   . Hyperlipidemia   . Hypothyroidism   . Internal hemorrhoids   . Leg cramps   . Lung nodule    stable LUL 9 mm  . Menopausal syndrome   . Osteoarthritis   . UTI (urinary tract infection)     Past Surgical History:  Procedure Laterality Date  . Abd U/S  11/1998   negative  . Abd U/S  01/2001   gallbladder polyps  . ABDOMINAL HYSTERECTOMY  1975   total-fibroids  . APPENDECTOMY  1975  . BREAST BIOPSY     benign  . BREAST EXCISIONAL BIOPSY Left 1957  . CARDIOVERSION N/A 03/07/2016   Procedure: CARDIOVERSION;  Surgeon: Adrian Prows, MD;  Location: [facility];  Service: Cardiovascular;  Laterality: N/A;  . CATARACT EXTRACTION     bilateral  . CHOLECYSTECTOMY    . COLONOSCOPY  1998   Diverticulosis; polyp\  . COLONOSCOPY  09/2002   Diverticulosis, hem  . COLONOSCOPY  12/2007   diverticulosis, polyp  . DEXA  10/2001   osteopenia  . ELECTROPHYSIOLOGIC STUDY N/A 04/03/2016   Procedure: A-Flutter Ablation;  Surgeon: Will Meredith Leeds, MD;  Location: [facility];  Service: Cardiovascular;  Laterality: N/A;  . ESOPHAGOGASTRODUODENOSCOPY  2004  . [place] scan  01/2001   Negative  . KNEE SURGERY     right knee / 11/2004  . LAMINECTOMY WITH POSTERIOR LATERAL ARTHRODESIS  LEVEL 2 N/A 07/31/2017   Procedure: DECOMPRESSIVE LAMINECTOMY LUMABR FOUR-FIVE, LUMBAR FIVE-SACRAL ONE;  Surgeon: Eustace Moore, MD;  Location: [facility];  Service: Neurosurgery;  Laterality: N/A;  . laser surgery for glaucoma Left Sep 21, 2014  . ROTATOR CUFF REPAIR     x 2 /right shoulder  . SKIN CANCER EXCISION     pre-melanoma / on face  . TEE WITHOUT CARDIOVERSION N/A 03/07/2016   Procedure: TRANSESOPHAGEAL ECHOCARDIOGRAM (TEE);  Surgeon: Adrian Prows, MD;  Location: [facility];  Service: Cardiovascular;  Laterality: N/A;  . TOE SURGERY     rt foot     There were no vitals filed for this visit.  Subjective Assessment - 05/28/18 1347    Subjective  no changes yet    Currently in Pain?  Yes    Pain Score  5     Pain Location  Back    Pain Orientation  Right;Lower                       [facility] Adult PT Treatment/Exercise - 05/28/18 0001      Exercises   Exercises  Lumbar      Lumbar Exercises: Aerobic  [place], [place], [address] Phone: [phone]   Fax:  [phone]  Physical Therapy Treatment  Patient Details  Name: Linda Mcgrath MRN: [id number] Date of Birth: 09-Oct-1932 Referring Provider (PT): [name]   Encounter Date: 05/28/2018  PT End of Session - 05/28/18 1421    Visit Number  3    Date for PT Re-Evaluation  07/20/18    PT Start Time  1350    PT Stop Time  1440    PT Time Calculation (min)  50 min       Past Medical History:  Diagnosis Date  . Allergic rhinitis   . Asthma   . Cataract    Bil/lens implant  . Colon polyp 1999   small polyp  . Diverticulosis   . Dysrhythmia   . Fibromyalgia   . Hyperlipidemia   . Hypothyroidism   . Internal hemorrhoids   . Leg cramps   . Lung nodule    stable LUL 9 mm  . Menopausal syndrome   . Osteoarthritis   . UTI (urinary tract infection)     Past Surgical History:  Procedure Laterality Date  . Abd U/S  11/1998   negative  . Abd U/S  01/2001   gallbladder polyps  . ABDOMINAL HYSTERECTOMY  1975   total-fibroids  . APPENDECTOMY  1975  . BREAST BIOPSY     benign  . BREAST EXCISIONAL BIOPSY Left 1957  . CARDIOVERSION N/A 03/07/2016   Procedure: CARDIOVERSION;  Surgeon: Adrian Prows, MD;  Location: [facility];  Service: Cardiovascular;  Laterality: N/A;  . CATARACT EXTRACTION     bilateral  . CHOLECYSTECTOMY    . COLONOSCOPY  1998   Diverticulosis; polyp\  . COLONOSCOPY  09/2002   Diverticulosis, hem  . COLONOSCOPY  12/2007   diverticulosis, polyp  . DEXA  10/2001   osteopenia  . ELECTROPHYSIOLOGIC STUDY N/A 04/03/2016   Procedure: A-Flutter Ablation;  Surgeon: Will Meredith Leeds, MD;  Location: [facility];  Service: Cardiovascular;  Laterality: N/A;  . ESOPHAGOGASTRODUODENOSCOPY  2004  . [place] scan  01/2001   Negative  . KNEE SURGERY     right knee / 11/2004  . LAMINECTOMY WITH POSTERIOR LATERAL ARTHRODESIS  LEVEL 2 N/A 07/31/2017   Procedure: DECOMPRESSIVE LAMINECTOMY LUMABR FOUR-FIVE, LUMBAR FIVE-SACRAL ONE;  Surgeon: Eustace Moore, MD;  Location: [facility];  Service: Neurosurgery;  Laterality: N/A;  . laser surgery for glaucoma Left Sep 21, 2014  . ROTATOR CUFF REPAIR     x 2 /right shoulder  . SKIN CANCER EXCISION     pre-melanoma / on face  . TEE WITHOUT CARDIOVERSION N/A 03/07/2016   Procedure: TRANSESOPHAGEAL ECHOCARDIOGRAM (TEE);  Surgeon: Adrian Prows, MD;  Location: [facility];  Service: Cardiovascular;  Laterality: N/A;  . TOE SURGERY     rt foot     There were no vitals filed for this visit.  Subjective Assessment - 05/28/18 1347    Subjective  no changes yet    Currently in Pain?  Yes    Pain Score  5     Pain Location  Back    Pain Orientation  Right;Lower                       [facility] Adult PT Treatment/Exercise - 05/28/18 0001      Exercises   Exercises  Lumbar      Lumbar Exercises: Aerobic

## 2018-06-01 ENCOUNTER — Ambulatory Visit: Payer: Medicare Other | Admitting: Physical Therapy

## 2018-06-01 ENCOUNTER — Encounter: Payer: Self-pay | Admitting: Physical Therapy

## 2018-06-01 DIAGNOSIS — M5441 Lumbago with sciatica, right side: Secondary | ICD-10-CM | POA: Diagnosis not present

## 2018-06-01 DIAGNOSIS — R262 Difficulty in walking, not elsewhere classified: Secondary | ICD-10-CM | POA: Diagnosis not present

## 2018-06-01 DIAGNOSIS — M62830 Muscle spasm of back: Secondary | ICD-10-CM | POA: Diagnosis not present

## 2018-06-01 DIAGNOSIS — M545 Low back pain: Secondary | ICD-10-CM | POA: Diagnosis not present

## 2018-06-01 NOTE — Therapy (Signed)
[place], [place], [address] Phone: [phone]   Fax:  [phone]  Physical Therapy Treatment  Patient Details  Name: Linda Mcgrath MRN: [id number] Date of Birth: November 18, 1932 Referring Provider (PT): [name]   Encounter Date: 06/01/2018    Past Medical History:  Diagnosis Date  . Allergic rhinitis   . Asthma   . Cataract    Bil/lens implant  . Colon polyp 1999   small polyp  . Diverticulosis   . Dysrhythmia   . Fibromyalgia   . Hyperlipidemia   . Hypothyroidism   . Internal hemorrhoids   . Leg cramps   . Lung nodule    stable LUL 9 mm  . Menopausal syndrome   . Osteoarthritis   . UTI (urinary tract infection)     Past Surgical History:  Procedure Laterality Date  . Abd U/S  11/1998   negative  . Abd U/S  01/2001   gallbladder polyps  . ABDOMINAL HYSTERECTOMY  1975   total-fibroids  . APPENDECTOMY  1975  . BREAST BIOPSY     benign  . BREAST EXCISIONAL BIOPSY Left 1957  . CARDIOVERSION N/A 03/07/2016   Procedure: CARDIOVERSION;  Surgeon: Adrian Prows, MD;  Location: [facility];  Service: Cardiovascular;  Laterality: N/A;  . CATARACT EXTRACTION     bilateral  . CHOLECYSTECTOMY    . COLONOSCOPY  1998   Diverticulosis; polyp\  . COLONOSCOPY  09/2002   Diverticulosis, hem  . COLONOSCOPY  12/2007   diverticulosis, polyp  . DEXA  10/2001   osteopenia  . ELECTROPHYSIOLOGIC STUDY N/A 04/03/2016   Procedure: A-Flutter Ablation;  Surgeon: Will Meredith Leeds, MD;  Location: [facility];  Service: Cardiovascular;  Laterality: N/A;  . ESOPHAGOGASTRODUODENOSCOPY  2004  . [place] scan  01/2001   Negative  . KNEE SURGERY     right knee / 11/2004  . LAMINECTOMY WITH POSTERIOR LATERAL ARTHRODESIS LEVEL 2 N/A 07/31/2017   Procedure: DECOMPRESSIVE LAMINECTOMY LUMABR FOUR-FIVE, LUMBAR FIVE-SACRAL ONE;  Surgeon: Eustace Moore, MD;  Location: [facility];  Service: Neurosurgery;   Laterality: N/A;  . laser surgery for glaucoma Left Sep 21, 2014  . ROTATOR CUFF REPAIR     x 2 /right shoulder  . SKIN CANCER EXCISION     pre-melanoma / on face  . TEE WITHOUT CARDIOVERSION N/A 03/07/2016   Procedure: TRANSESOPHAGEAL ECHOCARDIOGRAM (TEE);  Surgeon: Adrian Prows, MD;  Location: [facility] [place];  Service: Cardiovascular;  Laterality: N/A;  . TOE SURGERY     rt foot     There were no vitals filed for this visit.  Subjective Assessment - 06/01/18 1443    Subjective  About the same, worst is in the morning    Currently in Pain?  Yes    Pain Score  6     Pain Location  Back    Pain Orientation  Right;Lower    Aggravating Factors   morning is the worst                       [facility] Adult PT Treatment/Exercise - 06/01/18 0001      Lumbar Exercises: Aerobic   Nustep  level 4 x 6 minutes      Moist Heat Therapy   Number Minutes Moist Heat  15 Minutes    Moist Heat Location  Lumbar Spine      Electrical Stimulation   Electrical Stimulation Location  right  Linda Mcgrath MRN: [id number] Date of Birth: 05-26-1933  Linda Mcgrath MRN: [id number] Date of Birth: 05-26-1933

## 2018-06-01 NOTE — Patient Instructions (Signed)

## 2018-06-03 ENCOUNTER — Ambulatory Visit: Payer: Medicare Other | Admitting: Physical Therapy

## 2018-06-03 ENCOUNTER — Encounter: Payer: Self-pay | Admitting: Physical Therapy

## 2018-06-03 DIAGNOSIS — M545 Low back pain, unspecified: Secondary | ICD-10-CM

## 2018-06-03 DIAGNOSIS — R262 Difficulty in walking, not elsewhere classified: Secondary | ICD-10-CM

## 2018-06-03 DIAGNOSIS — M62830 Muscle spasm of back: Secondary | ICD-10-CM

## 2018-06-03 DIAGNOSIS — M5441 Lumbago with sciatica, right side: Secondary | ICD-10-CM | POA: Diagnosis not present

## 2018-06-03 NOTE — Therapy (Signed)
[place], [place], [address] Phone: [phone]   Fax:  [phone]  Physical Therapy Treatment  Patient Details  Name: Linda Mcgrath MRN: [id number] Date of Birth: Oct 11, 1932 Referring Provider (PT): [name]   Encounter Date: 06/03/2018  PT End of Session - 06/03/18 1438    Visit Number  4    Date for PT Re-Evaluation  07/20/18    PT Start Time  9528    PT Stop Time  1450    PT Time Calculation (min)  47 min    Activity Tolerance  Patient tolerated treatment well    Behavior During Therapy  [facility] for tasks assessed/performed       Past Medical History:  Diagnosis Date  . Allergic rhinitis   . Asthma   . Cataract    Bil/lens implant  . Colon polyp 1999   small polyp  . Diverticulosis   . Dysrhythmia   . Fibromyalgia   . Hyperlipidemia   . Hypothyroidism   . Internal hemorrhoids   . Leg cramps   . Lung nodule    stable LUL 9 mm  . Menopausal syndrome   . Osteoarthritis   . UTI (urinary tract infection)     Past Surgical History:  Procedure Laterality Date  . Abd U/S  11/1998   negative  . Abd U/S  01/2001   gallbladder polyps  . ABDOMINAL HYSTERECTOMY  1975   total-fibroids  . APPENDECTOMY  1975  . BREAST BIOPSY     benign  . BREAST EXCISIONAL BIOPSY Left 1957  . CARDIOVERSION N/A 03/07/2016   Procedure: CARDIOVERSION;  Surgeon: Adrian Prows, MD;  Location: [place];  Service: Cardiovascular;  Laterality: N/A;  . CATARACT EXTRACTION     bilateral  . CHOLECYSTECTOMY    . COLONOSCOPY  1998   Diverticulosis; polyp\  . COLONOSCOPY  09/2002   Diverticulosis, hem  . COLONOSCOPY  12/2007   diverticulosis, polyp  . DEXA  10/2001   osteopenia  . ELECTROPHYSIOLOGIC STUDY N/A 04/03/2016   Procedure: A-Flutter Ablation;  Surgeon: Will Meredith Leeds, MD;  Location: [place] [facility];  Service: Cardiovascular;  Laterality: N/A;  . ESOPHAGOGASTRODUODENOSCOPY  2004  . Hida  scan  01/2001   Negative  . KNEE SURGERY     right knee / 11/2004  . LAMINECTOMY WITH POSTERIOR LATERAL ARTHRODESIS LEVEL 2 N/A 07/31/2017   Procedure: DECOMPRESSIVE LAMINECTOMY LUMABR FOUR-FIVE, LUMBAR FIVE-SACRAL ONE;  Surgeon: Eustace Moore, MD;  Location: [place];  Service: Neurosurgery;  Laterality: N/A;  . laser surgery for glaucoma Left Sep 21, 2014  . ROTATOR CUFF REPAIR     x 2 /right shoulder  . SKIN CANCER EXCISION     pre-melanoma / on face  . TEE WITHOUT CARDIOVERSION N/A 03/07/2016   Procedure: TRANSESOPHAGEAL ECHOCARDIOGRAM (TEE);  Surgeon: Adrian Prows, MD;  Location: [place];  Service: Cardiovascular;  Laterality: N/A;  . TOE SURGERY     rt foot     There were no vitals filed for this visit.  Subjective Assessment - 06/03/18 1436    Subjective  Patient reports that she felt like the last treatment helped    Currently in Pain?  Yes    Pain Score  5     Pain Location  Back    Pain Orientation  Right;Lower    Pain Descriptors / Indicators  Spasm  [place], [place], [address] Phone: [phone]   Fax:  [phone]  Physical Therapy Treatment  Patient Details  Name: Linda Mcgrath MRN: [id number] Date of Birth: Oct 11, 1932 Referring Provider (PT): [name]   Encounter Date: 06/03/2018  PT End of Session - 06/03/18 1438    Visit Number  4    Date for PT Re-Evaluation  07/20/18    PT Start Time  9528    PT Stop Time  1450    PT Time Calculation (min)  47 min    Activity Tolerance  Patient tolerated treatment well    Behavior During Therapy  [facility] for tasks assessed/performed       Past Medical History:  Diagnosis Date  . Allergic rhinitis   . Asthma   . Cataract    Bil/lens implant  . Colon polyp 1999   small polyp  . Diverticulosis   . Dysrhythmia   . Fibromyalgia   . Hyperlipidemia   . Hypothyroidism   . Internal hemorrhoids   . Leg cramps   . Lung nodule    stable LUL 9 mm  . Menopausal syndrome   . Osteoarthritis   . UTI (urinary tract infection)     Past Surgical History:  Procedure Laterality Date  . Abd U/S  11/1998   negative  . Abd U/S  01/2001   gallbladder polyps  . ABDOMINAL HYSTERECTOMY  1975   total-fibroids  . APPENDECTOMY  1975  . BREAST BIOPSY     benign  . BREAST EXCISIONAL BIOPSY Left 1957  . CARDIOVERSION N/A 03/07/2016   Procedure: CARDIOVERSION;  Surgeon: Adrian Prows, MD;  Location: [place];  Service: Cardiovascular;  Laterality: N/A;  . CATARACT EXTRACTION     bilateral  . CHOLECYSTECTOMY    . COLONOSCOPY  1998   Diverticulosis; polyp\  . COLONOSCOPY  09/2002   Diverticulosis, hem  . COLONOSCOPY  12/2007   diverticulosis, polyp  . DEXA  10/2001   osteopenia  . ELECTROPHYSIOLOGIC STUDY N/A 04/03/2016   Procedure: A-Flutter Ablation;  Surgeon: Will Meredith Leeds, MD;  Location: [place] [facility];  Service: Cardiovascular;  Laterality: N/A;  . ESOPHAGOGASTRODUODENOSCOPY  2004  . Hida  scan  01/2001   Negative  . KNEE SURGERY     right knee / 11/2004  . LAMINECTOMY WITH POSTERIOR LATERAL ARTHRODESIS LEVEL 2 N/A 07/31/2017   Procedure: DECOMPRESSIVE LAMINECTOMY LUMABR FOUR-FIVE, LUMBAR FIVE-SACRAL ONE;  Surgeon: Eustace Moore, MD;  Location: [place];  Service: Neurosurgery;  Laterality: N/A;  . laser surgery for glaucoma Left Sep 21, 2014  . ROTATOR CUFF REPAIR     x 2 /right shoulder  . SKIN CANCER EXCISION     pre-melanoma / on face  . TEE WITHOUT CARDIOVERSION N/A 03/07/2016   Procedure: TRANSESOPHAGEAL ECHOCARDIOGRAM (TEE);  Surgeon: Adrian Prows, MD;  Location: [place];  Service: Cardiovascular;  Laterality: N/A;  . TOE SURGERY     rt foot     There were no vitals filed for this visit.  Subjective Assessment - 06/03/18 1436    Subjective  Patient reports that she felt like the last treatment helped    Currently in Pain?  Yes    Pain Score  5     Pain Location  Back    Pain Orientation  Right;Lower    Pain Descriptors / Indicators  Spasm  deficiency 06/06/2008  . Hyperlipidemia 06/06/2008  . MENOPAUSAL SYNDROME 03/29/2008  . Osteopenia 03/29/2008  . ALLERGIC RHINITIS 01/25/2008  . IBS 01/25/2008  . OSTEOARTHRITIS 01/25/2008  . FIBROMYALGIA 01/25/2008    [name], PT 06/03/2018, 2:40 PM  [place], [place], [address] Phone: [phone]   Fax:  [phone]  Name: ARTEMIS KOLLER MRN: [id number] Date of Birth: Jun 02, 1933

## 2018-06-08 ENCOUNTER — Encounter: Payer: Self-pay | Admitting: Physical Therapy

## 2018-06-08 ENCOUNTER — Ambulatory Visit: Payer: Medicare Other | Admitting: Physical Therapy

## 2018-06-08 DIAGNOSIS — M5441 Lumbago with sciatica, right side: Secondary | ICD-10-CM

## 2018-06-08 DIAGNOSIS — R262 Difficulty in walking, not elsewhere classified: Secondary | ICD-10-CM

## 2018-06-08 DIAGNOSIS — M62830 Muscle spasm of back: Secondary | ICD-10-CM | POA: Diagnosis not present

## 2018-06-08 DIAGNOSIS — M545 Low back pain: Secondary | ICD-10-CM | POA: Diagnosis not present

## 2018-06-08 NOTE — Therapy (Signed)
[facility] [address] [place], [place], [address] Phone: [phone]   Fax:  [phone]  Physical Therapy Treatment  Patient Details  Name: Linda Mcgrath MRN: [id number] Date of Birth: Jan 29, 1933 Referring Provider (PT): [name]   Encounter Date: 06/08/2018  PT End of Session - 06/08/18 1522    Visit Number  5    Date for PT Re-Evaluation  07/20/18    PT Start Time  1350    PT Stop Time  1445    PT Time Calculation (min)  55 min    Activity Tolerance  Patient tolerated treatment well    Behavior During Therapy  [facility] for tasks assessed/performed       Past Medical History:  Diagnosis Date  . Allergic rhinitis   . Asthma   . Cataract    Bil/lens implant  . Colon polyp 1999   small polyp  . Diverticulosis   . Dysrhythmia   . Fibromyalgia   . Hyperlipidemia   . Hypothyroidism   . Internal hemorrhoids   . Leg cramps   . Lung nodule    stable LUL 9 mm  . Menopausal syndrome   . Osteoarthritis   . UTI (urinary tract infection)     Past Surgical History:  Procedure Laterality Date  . Abd U/S  11/1998   negative  . Abd U/S  01/2001   gallbladder polyps  . ABDOMINAL HYSTERECTOMY  1975   total-fibroids  . APPENDECTOMY  1975  . BREAST BIOPSY     benign  . BREAST EXCISIONAL BIOPSY Left 1957  . CARDIOVERSION N/A 03/07/2016   Procedure: CARDIOVERSION;  Surgeon: Yates Decamp, MD;  Location: [facility];  Service: Cardiovascular;  Laterality: N/A;  . CATARACT EXTRACTION     bilateral  . CHOLECYSTECTOMY    . COLONOSCOPY  1998   Diverticulosis; polyp\  . COLONOSCOPY  09/2002   Diverticulosis, hem  . COLONOSCOPY  12/2007   diverticulosis, polyp  . DEXA  10/2001   osteopenia  . ELECTROPHYSIOLOGIC STUDY N/A 04/03/2016   Procedure: A-Flutter Ablation;  Surgeon: Will [name], MD;  [facility];  Service: Cardiovascular;  Laterality: N/A;  . ESOPHAGOGASTRODUODENOSCOPY  2004  . Hida  scan  01/2001   Negative  . KNEE SURGERY     right knee / 11/2004  . LAMINECTOMY WITH POSTERIOR LATERAL ARTHRODESIS LEVEL 2 N/A 07/31/2017   Procedure: DECOMPRESSIVE LAMINECTOMY LUMABR FOUR-FIVE, LUMBAR FIVE-SACRAL ONE;  Surgeon: Tia Alert, MD;  Location: [facility] OR;  Service: Neurosurgery;  Laterality: N/A;  . laser surgery for glaucoma Left Sep 21, 2014  . ROTATOR CUFF REPAIR     x 2 /right shoulder  . SKIN CANCER EXCISION     pre-melanoma / on face  . TEE WITHOUT CARDIOVERSION N/A 03/07/2016   Procedure: TRANSESOPHAGEAL ECHOCARDIOGRAM (TEE);  Surgeon: Yates Decamp, MD;  Location: [facility];  Service: Cardiovascular;  Laterality: N/A;  . TOE SURGERY     rt foot     There were no vitals filed for this visit.  Subjective Assessment - 06/08/18 1359    Subjective  Patient reports that she has been doing some shopping and is hurting a little more today    Currently in Pain?  Yes    Pain Score  4     Pain Location  Back    Pain Orientation  Lower;Right    Aggravating Factors   standing hurts  [facility] Adult PT Treatment/Exercise - 06/08/18 0001      Exercises   Exercises  Lumbar      Lumbar Exercises: Aerobic   Nustep  level 4 x 6 minutes      Lumbar Exercises: Standing   Scapular Retraction  20 reps;Theraband    Theraband Level (Scapular Retraction)  Level 2 (Red)    Row  20 reps;Theraband    Theraband Level (Row)  Level 2 (Red)    Other Standing Lumbar Exercises  W backs, hip extension and hip abduction all 2x10 with some cues for form.      Lumbar Exercises: Seated   Other Seated Lumbar Exercises  physioball in lap isometric abs    Other Seated Lumbar Exercises  on sit fit pelvic mobility and gentle initiation of stability, marches, LAQ's red tband HS curls and red scapular stabilization      Moist Heat Therapy   Number Minutes Moist Heat  15 Minutes    Moist Heat Location  Lumbar Spine      Electrical Stimulation   Electrical  Stimulation Location  right lumbar area    Electrical Stimulation Action  IFC    Electrical Stimulation Parameters  sitting     Electrical Stimulation Goals  Pain      Manual Therapy   Manual Therapy  Soft tissue mobilization    Soft tissue mobilization  with her in sitting to the right lumbar and buttock area               PT Short Term Goals - 05/28/18 1422      PT SHORT TERM GOAL #1   Title  independent with HEP    Status  Achieved        PT Long Term Goals - 06/03/18 1440      PT LONG TERM GOAL #1   Title  decrease pain 50%    Status  On-going      PT LONG TERM GOAL #2   Title  Increase lumbar AROM 25% in extension, side-bending, and rotation.     Status  On-going      PT LONG TERM GOAL #3   Title  Understand proper posture and body mechanics for housework and yardwork tasks.     Status  On-going      PT LONG TERM GOAL #4   Title  25% reported less difficulty sleeping    Status  On-going            Plan - 06/08/18 1522    Clinical Impression Statement  Patient reports that she is very pleased that she has been able to roll over in bed and get out of bed without "a significant increase of pain"  She is still gaurded with motions and palpation, tolerating the STM a littl better    PT Next Visit Plan  work on the spasms and the stability exercises    Consulted and Agree with Plan of Care  Patient       Patient will benefit from skilled therapeutic intervention in order to improve the following deficits and impairments:  Decreased activity tolerance, Decreased mobility, Decreased endurance, Cardiopulmonary status limiting activity, Decreased range of motion, Decreased strength, Postural dysfunction, Improper body mechanics, Impaired flexibility, Pain, Increased muscle spasms, Difficulty walking  Visit Diagnosis: Acute right-sided low back pain with right-sided sciatica  Muscle spasm of back  Difficulty in walking, not elsewhere  classified     Problem List Patient Active Problem List   Diagnosis  Date Noted  . S/P lumbar spinal fusion 07/31/2017  . Right low back pain 03/04/2017  . Arthropod bite 01/28/2017  . Atrial flutter (HCC) 03/06/2016  . Obesity 11/06/2015  . Urge incontinence 01/24/2015  . Estrogen deficiency 10/25/2014  . Lichen planus 12/20/2013  . Encounter for Medicare annual wellness exam 06/17/2013  . Screening mammogram, encounter for 06/15/2012  . Hyperglycemia 06/03/2011  . HYPERTENSION, BENIGN ESSENTIAL 10/23/2010  . CONSTIPATION 10/23/2010  . HEMATOCHEZIA 10/23/2010  . OSTEOARTHRITIS, HANDS, BILATERAL 01/24/2010  . Hypothyroidism 08/24/2008  . Vitamin D deficiency 06/06/2008  . Hyperlipidemia 06/06/2008  . MENOPAUSAL SYNDROME 03/29/2008  . Osteopenia 03/29/2008  . ALLERGIC RHINITIS 01/25/2008  . IBS 01/25/2008  . OSTEOARTHRITIS 01/25/2008  . FIBROMYALGIA 01/25/2008    Jearld Lesch., PT 06/08/2018, 3:24 PM  [facility] [place] [address] [place], [place], [address] Phone: [phone]   Fax:  [phone]  Name: LENITA NISKANEN MRN: [id number] Date of Birth: [birth year]

## 2018-06-11 ENCOUNTER — Ambulatory Visit: Payer: Medicare Other | Admitting: Physical Therapy

## 2018-06-11 ENCOUNTER — Encounter: Payer: Self-pay | Admitting: Physical Therapy

## 2018-06-11 DIAGNOSIS — R262 Difficulty in walking, not elsewhere classified: Secondary | ICD-10-CM

## 2018-06-11 DIAGNOSIS — M5441 Lumbago with sciatica, right side: Secondary | ICD-10-CM

## 2018-06-11 DIAGNOSIS — M545 Low back pain: Secondary | ICD-10-CM | POA: Diagnosis not present

## 2018-06-11 DIAGNOSIS — M62830 Muscle spasm of back: Secondary | ICD-10-CM | POA: Diagnosis not present

## 2018-06-11 NOTE — Therapy (Signed)
[place], [place], [address] Phone: [phone]   Fax:  [phone]  Physical Therapy Treatment  Patient Details  Name: Linda Mcgrath MRN: [id number] Date of Birth: 24-Aug-1933 Referring Provider (PT): [name]   Encounter Date: 06/11/2018  PT End of Session - 06/11/18 1432    Visit Number  6    Date for PT Re-Evaluation  07/20/18    PT Start Time  1350    PT Stop Time  1445    PT Time Calculation (min)  55 min    Activity Tolerance  Patient tolerated treatment well    Behavior During Therapy  [facility] for tasks assessed/performed       Past Medical History:  Diagnosis Date  . Allergic rhinitis   . Asthma   . Cataract    Bil/lens implant  . Colon polyp 1999   small polyp  . Diverticulosis   . Dysrhythmia   . Fibromyalgia   . Hyperlipidemia   . Hypothyroidism   . Internal hemorrhoids   . Leg cramps   . Lung nodule    stable LUL 9 mm  . Menopausal syndrome   . Osteoarthritis   . UTI (urinary tract infection)     Past Surgical History:  Procedure Laterality Date  . Abd U/S  11/1998   negative  . Abd U/S  01/2001   gallbladder polyps  . ABDOMINAL HYSTERECTOMY  1975   total-fibroids  . APPENDECTOMY  1975  . BREAST BIOPSY     benign  . BREAST EXCISIONAL BIOPSY Left 1957  . CARDIOVERSION N/A 03/07/2016   Procedure: CARDIOVERSION;  Surgeon: Adrian Prows, MD;  Location: [place];  Service: Cardiovascular;  Laterality: N/A;  . CATARACT EXTRACTION     bilateral  . CHOLECYSTECTOMY    . COLONOSCOPY  1998   Diverticulosis; polyp\  . COLONOSCOPY  09/2002   Diverticulosis, hem  . COLONOSCOPY  12/2007   diverticulosis, polyp  . DEXA  10/2001   osteopenia  . ELECTROPHYSIOLOGIC STUDY N/A 04/03/2016   Procedure: A-Flutter Ablation;  Surgeon: Will Meredith Leeds, MD;  Location: [place] [facility];  Service: Cardiovascular;  Laterality: N/A;  . ESOPHAGOGASTRODUODENOSCOPY  2004  . Hida  scan  01/2001   Negative  . KNEE SURGERY     right knee / 11/2004  . LAMINECTOMY WITH POSTERIOR LATERAL ARTHRODESIS LEVEL 2 N/A 07/31/2017   Procedure: DECOMPRESSIVE LAMINECTOMY LUMABR FOUR-FIVE, LUMBAR FIVE-SACRAL ONE;  Surgeon: Eustace Moore, MD;  Location: [place];  Service: Neurosurgery;  Laterality: N/A;  . laser surgery for glaucoma Left Sep 21, 2014  . ROTATOR CUFF REPAIR     x 2 /right shoulder  . SKIN CANCER EXCISION     pre-melanoma / on face  . TEE WITHOUT CARDIOVERSION N/A 03/07/2016   Procedure: TRANSESOPHAGEAL ECHOCARDIOGRAM (TEE);  Surgeon: Adrian Prows, MD;  Location: [place];  Service: Cardiovascular;  Laterality: N/A;  . TOE SURGERY     rt foot     There were no vitals filed for this visit.  Subjective Assessment - 06/11/18 1430    Subjective  atient reports that she is walking better, having less back pain    Currently in Pain?  Yes    Pain Score  3     Pain Location  Back    Pain Orientation  Right;Lower    Pain Descriptors / Indicators  Sore  fusion 07/31/2017  . Right low back pain 03/04/2017  . Arthropod bite 01/28/2017  . Atrial flutter ([place]) 03/06/2016  . Obesity 11/06/2015  . Urge incontinence 01/24/2015  . Estrogen deficiency 10/25/2014  . Lichen planus 91/91/6606  . Encounter for Medicare annual wellness exam 06/17/2013  . Screening mammogram, encounter for 06/15/2012  . Hyperglycemia 06/03/2011  . HYPERTENSION, BENIGN ESSENTIAL 10/23/2010  . CONSTIPATION 10/23/2010  . HEMATOCHEZIA 10/23/2010  . OSTEOARTHRITIS, HANDS, BILATERAL 01/24/2010  . Hypothyroidism 08/24/2008  . Vitamin D deficiency 06/06/2008  . Hyperlipidemia 06/06/2008  . MENOPAUSAL SYNDROME 03/29/2008  . Osteopenia 03/29/2008  . ALLERGIC RHINITIS 01/25/2008  . IBS 01/25/2008  . OSTEOARTHRITIS 01/25/2008  . FIBROMYALGIA 01/25/2008    [name], PT 06/11/2018, 2:34 PM  [place], [place], [address] Phone: [phone]   Fax:  [phone]  Name: Linda Mcgrath MRN: [id number] Date of Birth: October 24, 1932  [place], [place], [address] Phone: [phone]   Fax:  [phone]  Physical Therapy Treatment  Patient Details  Name: Linda Mcgrath MRN: [id number] Date of Birth: 24-Aug-1933 Referring Provider (PT): [name]   Encounter Date: 06/11/2018  PT End of Session - 06/11/18 1432    Visit Number  6    Date for PT Re-Evaluation  07/20/18    PT Start Time  1350    PT Stop Time  1445    PT Time Calculation (min)  55 min    Activity Tolerance  Patient tolerated treatment well    Behavior During Therapy  [facility] for tasks assessed/performed       Past Medical History:  Diagnosis Date  . Allergic rhinitis   . Asthma   . Cataract    Bil/lens implant  . Colon polyp 1999   small polyp  . Diverticulosis   . Dysrhythmia   . Fibromyalgia   . Hyperlipidemia   . Hypothyroidism   . Internal hemorrhoids   . Leg cramps   . Lung nodule    stable LUL 9 mm  . Menopausal syndrome   . Osteoarthritis   . UTI (urinary tract infection)     Past Surgical History:  Procedure Laterality Date  . Abd U/S  11/1998   negative  . Abd U/S  01/2001   gallbladder polyps  . ABDOMINAL HYSTERECTOMY  1975   total-fibroids  . APPENDECTOMY  1975  . BREAST BIOPSY     benign  . BREAST EXCISIONAL BIOPSY Left 1957  . CARDIOVERSION N/A 03/07/2016   Procedure: CARDIOVERSION;  Surgeon: Adrian Prows, MD;  Location: [place];  Service: Cardiovascular;  Laterality: N/A;  . CATARACT EXTRACTION     bilateral  . CHOLECYSTECTOMY    . COLONOSCOPY  1998   Diverticulosis; polyp\  . COLONOSCOPY  09/2002   Diverticulosis, hem  . COLONOSCOPY  12/2007   diverticulosis, polyp  . DEXA  10/2001   osteopenia  . ELECTROPHYSIOLOGIC STUDY N/A 04/03/2016   Procedure: A-Flutter Ablation;  Surgeon: Will Meredith Leeds, MD;  Location: [place] [facility];  Service: Cardiovascular;  Laterality: N/A;  . ESOPHAGOGASTRODUODENOSCOPY  2004  . Hida  scan  01/2001   Negative  . KNEE SURGERY     right knee / 11/2004  . LAMINECTOMY WITH POSTERIOR LATERAL ARTHRODESIS LEVEL 2 N/A 07/31/2017   Procedure: DECOMPRESSIVE LAMINECTOMY LUMABR FOUR-FIVE, LUMBAR FIVE-SACRAL ONE;  Surgeon: Eustace Moore, MD;  Location: [place];  Service: Neurosurgery;  Laterality: N/A;  . laser surgery for glaucoma Left Sep 21, 2014  . ROTATOR CUFF REPAIR     x 2 /right shoulder  . SKIN CANCER EXCISION     pre-melanoma / on face  . TEE WITHOUT CARDIOVERSION N/A 03/07/2016   Procedure: TRANSESOPHAGEAL ECHOCARDIOGRAM (TEE);  Surgeon: Adrian Prows, MD;  Location: [place];  Service: Cardiovascular;  Laterality: N/A;  . TOE SURGERY     rt foot     There were no vitals filed for this visit.  Subjective Assessment - 06/11/18 1430    Subjective  atient reports that she is walking better, having less back pain    Currently in Pain?  Yes    Pain Score  3     Pain Location  Back    Pain Orientation  Right;Lower    Pain Descriptors / Indicators  Sore

## 2018-06-16 ENCOUNTER — Encounter: Payer: Self-pay | Admitting: Physical Therapy

## 2018-06-16 ENCOUNTER — Ambulatory Visit: Payer: Medicare Other | Admitting: Physical Therapy

## 2018-06-16 DIAGNOSIS — M5441 Lumbago with sciatica, right side: Secondary | ICD-10-CM | POA: Diagnosis not present

## 2018-06-16 DIAGNOSIS — M62830 Muscle spasm of back: Secondary | ICD-10-CM | POA: Diagnosis not present

## 2018-06-16 DIAGNOSIS — M545 Low back pain: Secondary | ICD-10-CM | POA: Diagnosis not present

## 2018-06-16 DIAGNOSIS — R262 Difficulty in walking, not elsewhere classified: Secondary | ICD-10-CM | POA: Diagnosis not present

## 2018-06-16 NOTE — Therapy (Signed)
[facility] [address] [place], [place], [address] Phone: [phone]   Fax:  [phone]  Physical Therapy Treatment  Patient Details  Name: Linda Mcgrath MRN: [id number] Date of Birth: February 13, 1933 Referring Provider (PT): [name]   Encounter Date: 06/16/2018  PT End of Session - 06/16/18 0919    Visit Number  7    Date for PT Re-Evaluation  07/20/18    PT Start Time  0834    PT Stop Time  0931    PT Time Calculation (min)  57 min    Activity Tolerance  Patient tolerated treatment well    Behavior During Therapy  [facility] for tasks assessed/performed       Past Medical History:  Diagnosis Date  . Allergic rhinitis   . Asthma   . Cataract    Bil/lens implant  . Colon polyp 1999   small polyp  . Diverticulosis   . Dysrhythmia   . Fibromyalgia   . Hyperlipidemia   . Hypothyroidism   . Internal hemorrhoids   . Leg cramps   . Lung nodule    stable LUL 9 mm  . Menopausal syndrome   . Osteoarthritis   . UTI (urinary tract infection)     Past Surgical History:  Procedure Laterality Date  . Abd U/S  11/1998   negative  . Abd U/S  01/2001   gallbladder polyps  . ABDOMINAL HYSTERECTOMY  1975   total-fibroids  . APPENDECTOMY  1975  . BREAST BIOPSY     benign  . BREAST EXCISIONAL BIOPSY Left 1957  . CARDIOVERSION N/A 03/07/2016   Procedure: CARDIOVERSION;  Surgeon: Yates Decamp, MD;  Location: [facility] ENDOSCOPY;  Service: Cardiovascular;  Laterality: N/A;  . CATARACT EXTRACTION     bilateral  . CHOLECYSTECTOMY    . COLONOSCOPY  1998   Diverticulosis; polyp\  . COLONOSCOPY  09/2002   Diverticulosis, hem  . COLONOSCOPY  12/2007   diverticulosis, polyp  . DEXA  10/2001   osteopenia  . ELECTROPHYSIOLOGIC STUDY N/A 04/03/2016   Procedure: A-Flutter Ablation;  Surgeon: Will [name], MD;  [facility] INVASIVE CV LAB;  Service: Cardiovascular;  Laterality: N/A;  . ESOPHAGOGASTRODUODENOSCOPY  2004  . Hida  scan  01/2001   Negative  . KNEE SURGERY     right knee / 11/2004  . LAMINECTOMY WITH POSTERIOR LATERAL ARTHRODESIS LEVEL 2 N/A 07/31/2017   Procedure: DECOMPRESSIVE LAMINECTOMY LUMABR FOUR-FIVE, LUMBAR FIVE-SACRAL ONE;  Surgeon: Tia Alert, MD;  Location: [facility] OR;  Service: Neurosurgery;  Laterality: N/A;  . laser surgery for glaucoma Left Sep 21, 2014  . ROTATOR CUFF REPAIR     x 2 /right shoulder  . SKIN CANCER EXCISION     pre-melanoma / on face  . TEE WITHOUT CARDIOVERSION N/A 03/07/2016   Procedure: TRANSESOPHAGEAL ECHOCARDIOGRAM (TEE);  Surgeon: Yates Decamp, MD;  Location: [facility] ENDOSCOPY;  Service: Cardiovascular;  Laterality: N/A;  . TOE SURGERY     rt foot     There were no vitals filed for this visit.  Subjective Assessment - 06/16/18 0839    Subjective  Patient reports that she is doing better, she does report that she went to the symphony last week and sat for 2.5 hours, she reports that she was really stiff and was unsure if she would be able to get up, reports very sore the next day    Currently in Pain?  Yes    Pain Score  4     Pain Location  Back    Pain Orientation  Lower;Right    Pain Descriptors / Indicators  Sore;Tightness    Aggravating Factors   sitting at the symphony increased the pain                       [facility] Adult PT Treatment/Exercise - 06/16/18 0001      Ambulation/Gait   Gait Comments  gait to front door round trip x 2 no device, some cues for upright posture      Lumbar Exercises: Aerobic   Nustep  level 4 x 6 minutes      Lumbar Exercises: Standing   Scapular Retraction  20 reps;Theraband    Theraband Level (Scapular Retraction)  Level 2 (Red)    Row  20 reps;Theraband    Theraband Level (Row)  Level 2 (Red)    Other Standing Lumbar Exercises  W backs, hip extension and hip abduction all 2x10 with some cues for form.      Lumbar Exercises: Seated   Other Seated Lumbar Exercises  physioball in lap isometric abs      Moist  Heat Therapy   Number Minutes Moist Heat  15 Minutes    Moist Heat Location  Lumbar Spine      Electrical Stimulation   Electrical Stimulation Location  right lumbar area    Electrical Stimulation Action  IFC    Electrical Stimulation Parameters  sitting    Electrical Stimulation Goals  Pain      Manual Therapy   Manual Therapy  Soft tissue mobilization    Soft tissue mobilization  with her in sitting to the right lumbar and buttock area               PT Short Term Goals - 05/28/18 1422      PT SHORT TERM GOAL #1   Title  independent with HEP    Status  Achieved        PT Long Term Goals - 06/16/18 1308      PT LONG TERM GOAL #1   Title  decrease pain 50%    Status  Partially Met      PT LONG TERM GOAL #3   Title  Understand proper posture and body mechanics for housework and yardwork tasks.     Status  Partially Met            Plan - 06/16/18 0919    Clinical Impression Statement  Patient able to walk on even surface x 450 feet without device and minimal deviation, some cues for posture.  She still has some spasms and tenderness in the lumbar area on the right    PT Next Visit Plan  continue to add exercises and gait    Consulted and Agree with Plan of Care  Patient       Patient will benefit from skilled therapeutic intervention in order to improve the following deficits and impairments:  Decreased activity tolerance, Decreased mobility, Decreased endurance, Cardiopulmonary status limiting activity, Decreased range of motion, Decreased strength, Postural dysfunction, Improper body mechanics, Impaired flexibility, Pain, Increased muscle spasms, Difficulty walking  Visit Diagnosis: Acute right-sided low back pain with right-sided sciatica  Muscle spasm of back  Difficulty in walking, not elsewhere classified     Problem List Patient Active Problem List   Diagnosis Date Noted  . S/P lumbar spinal fusion 07/31/2017  . Right low back pain  03/04/2017  .  Arthropod bite 01/28/2017  . Atrial flutter (HCC) 03/06/2016  . Obesity 11/06/2015  . Urge incontinence 01/24/2015  . Estrogen deficiency 10/25/2014  . Lichen planus 12/20/2013  . Encounter for Medicare annual wellness exam 06/17/2013  . Screening mammogram, encounter for 06/15/2012  . Hyperglycemia 06/03/2011  . HYPERTENSION, BENIGN ESSENTIAL 10/23/2010  . CONSTIPATION 10/23/2010  . HEMATOCHEZIA 10/23/2010  . OSTEOARTHRITIS, HANDS, BILATERAL 01/24/2010  . Hypothyroidism 08/24/2008  . Vitamin D deficiency 06/06/2008  . Hyperlipidemia 06/06/2008  . MENOPAUSAL SYNDROME 03/29/2008  . Osteopenia 03/29/2008  . ALLERGIC RHINITIS 01/25/2008  . IBS 01/25/2008  . OSTEOARTHRITIS 01/25/2008  . FIBROMYALGIA 01/25/2008    Jearld Lesch., PT 06/16/2018, 9:22 AM  [facility] [address] [place] [address] [place], [place], [address] Phone: [phone]   Fax:  [phone]  Name: Linda Mcgrath MRN: [id number] Date of Birth: [birth year]

## 2018-06-22 ENCOUNTER — Ambulatory Visit: Payer: Medicare Other | Admitting: Physical Therapy

## 2018-06-22 ENCOUNTER — Encounter: Payer: Self-pay | Admitting: Physical Therapy

## 2018-06-22 DIAGNOSIS — M62830 Muscle spasm of back: Secondary | ICD-10-CM | POA: Diagnosis not present

## 2018-06-22 DIAGNOSIS — M5441 Lumbago with sciatica, right side: Secondary | ICD-10-CM | POA: Diagnosis not present

## 2018-06-22 DIAGNOSIS — R262 Difficulty in walking, not elsewhere classified: Secondary | ICD-10-CM | POA: Diagnosis not present

## 2018-06-22 DIAGNOSIS — M545 Low back pain: Secondary | ICD-10-CM | POA: Diagnosis not present

## 2018-06-22 NOTE — Therapy (Signed)
12/20/2013  . Encounter for Medicare annual wellness exam 06/17/2013  . Screening mammogram, encounter for 06/15/2012  . Hyperglycemia 06/03/2011  . HYPERTENSION, BENIGN ESSENTIAL 10/23/2010  . CONSTIPATION 10/23/2010  . HEMATOCHEZIA 10/23/2010  . OSTEOARTHRITIS, HANDS, BILATERAL 01/24/2010  . Hypothyroidism 08/24/2008  . Vitamin D deficiency 06/06/2008  . Hyperlipidemia 06/06/2008  . MENOPAUSAL SYNDROME 03/29/2008  . Osteopenia 03/29/2008  . ALLERGIC RHINITIS 01/25/2008  . IBS 01/25/2008  . OSTEOARTHRITIS 01/25/2008  . FIBROMYALGIA 01/25/2008    [name], PT 06/22/2018, 5:13 PM  [address] [place], [place], [address] Phone: [phone]   Fax:  [phone]  Name: Linda Mcgrath MRN: [id number] Date of Birth: [birth year]  [place], [place], [address] Phone: [phone]   Fax:  [phone]  Physical Therapy Treatment  Patient Details  Name: Linda Mcgrath MRN: [id number] Date of Birth: 08-04-1933 Referring Provider (PT): [name]   Encounter Date: 06/22/2018  PT End of Session - 06/22/18 1711    Visit Number  8    Date for PT Re-Evaluation  07/20/18    PT Start Time  1007    PT Stop Time  1610    PT Time Calculation (min)  55 min    Activity Tolerance  Patient tolerated treatment well    Behavior During Therapy  [facility] for tasks assessed/performed       Past Medical History:  Diagnosis Date  . Allergic rhinitis   . Asthma   . Cataract    Bil/lens implant  . Colon polyp 1999   small polyp  . Diverticulosis   . Dysrhythmia   . Fibromyalgia   . Hyperlipidemia   . Hypothyroidism   . Internal hemorrhoids   . Leg cramps   . Lung nodule    stable LUL 9 mm  . Menopausal syndrome   . Osteoarthritis   . UTI (urinary tract infection)     Past Surgical History:  Procedure Laterality Date  . Abd U/S  11/1998   negative  . Abd U/S  01/2001   gallbladder polyps  . ABDOMINAL HYSTERECTOMY  1975   total-fibroids  . APPENDECTOMY  1975  . BREAST BIOPSY     benign  . BREAST EXCISIONAL BIOPSY Left 1957  . CARDIOVERSION N/A 03/07/2016   Procedure: CARDIOVERSION;  Surgeon: Adrian Prows, MD;  Location: [place];  Service: Cardiovascular;  Laterality: N/A;  . CATARACT EXTRACTION     bilateral  . CHOLECYSTECTOMY    . COLONOSCOPY  1998   Diverticulosis; polyp\  . COLONOSCOPY  09/2002   Diverticulosis, hem  . COLONOSCOPY  12/2007   diverticulosis, polyp  . DEXA  10/2001   osteopenia  . ELECTROPHYSIOLOGIC STUDY N/A 04/03/2016   Procedure: A-Flutter Ablation;  Surgeon: Will Meredith Leeds, MD;  Location: [place] CV LAB;  Service: Cardiovascular;  Laterality: N/A;  . ESOPHAGOGASTRODUODENOSCOPY  2004  . Hida  scan  01/2001   Negative  . KNEE SURGERY     right knee / 11/2004  . LAMINECTOMY WITH POSTERIOR LATERAL ARTHRODESIS LEVEL 2 N/A 07/31/2017   Procedure: DECOMPRESSIVE LAMINECTOMY LUMABR FOUR-FIVE, LUMBAR FIVE-SACRAL ONE;  Surgeon: Eustace Moore, MD;  Location: [place];  Service: Neurosurgery;  Laterality: N/A;  . laser surgery for glaucoma Left Sep 21, 2014  . ROTATOR CUFF REPAIR     x 2 /right shoulder  . SKIN CANCER EXCISION     pre-melanoma / on face  . TEE WITHOUT CARDIOVERSION N/A 03/07/2016   Procedure: TRANSESOPHAGEAL ECHOCARDIOGRAM (TEE);  Surgeon: Adrian Prows, MD;  Location: [place];  Service: Cardiovascular;  Laterality: N/A;  . TOE SURGERY     rt foot     There were no vitals filed for this visit.  Subjective Assessment - 06/22/18 1518    Subjective  I think I am still getting better, stilll "a bite" in the right low back    Currently in Pain?  Yes    Pain Score  3     Pain Location  Back    Pain Orientation  Right;Lower    Pain Descriptors / Indicators  Sore    Aggravating Factors  12/20/2013  . Encounter for Medicare annual wellness exam 06/17/2013  . Screening mammogram, encounter for 06/15/2012  . Hyperglycemia 06/03/2011  . HYPERTENSION, BENIGN ESSENTIAL 10/23/2010  . CONSTIPATION 10/23/2010  . HEMATOCHEZIA 10/23/2010  . OSTEOARTHRITIS, HANDS, BILATERAL 01/24/2010  . Hypothyroidism 08/24/2008  . Vitamin D deficiency 06/06/2008  . Hyperlipidemia 06/06/2008  . MENOPAUSAL SYNDROME 03/29/2008  . Osteopenia 03/29/2008  . ALLERGIC RHINITIS 01/25/2008  . IBS 01/25/2008  . OSTEOARTHRITIS 01/25/2008  . FIBROMYALGIA 01/25/2008    [name], PT 06/22/2018, 5:13 PM  [address] [place], [place], [address] Phone: [phone]   Fax:  [phone]  Name: Linda Mcgrath MRN: [id number] Date of Birth: [birth year]

## 2018-06-24 ENCOUNTER — Encounter: Payer: Self-pay | Admitting: Physical Therapy

## 2018-06-24 ENCOUNTER — Ambulatory Visit: Payer: Medicare Other | Admitting: Physical Therapy

## 2018-06-24 DIAGNOSIS — M62830 Muscle spasm of back: Secondary | ICD-10-CM

## 2018-06-24 DIAGNOSIS — M5441 Lumbago with sciatica, right side: Secondary | ICD-10-CM

## 2018-06-24 DIAGNOSIS — R262 Difficulty in walking, not elsewhere classified: Secondary | ICD-10-CM | POA: Diagnosis not present

## 2018-06-24 DIAGNOSIS — M545 Low back pain: Secondary | ICD-10-CM | POA: Diagnosis not present

## 2018-06-24 NOTE — Therapy (Signed)
[place], [place], [address] Phone: [phone]   Fax:  [phone]  Physical Therapy Treatment  Patient Details  Name: Linda Mcgrath MRN: [id number] Date of Birth: 11-Jan-1933 Referring Provider (PT): [name]   Encounter Date: 06/24/2018  PT End of Session - 06/24/18 1513    Visit Number  9    Date for PT Re-Evaluation  07/20/18    PT Start Time  1433    PT Stop Time  1527    PT Time Calculation (min)  54 min    Activity Tolerance  Patient tolerated treatment well    Behavior During Therapy  [facility] for tasks assessed/performed       Past Medical History:  Diagnosis Date  . Allergic rhinitis   . Asthma   . Cataract    Bil/lens implant  . Colon polyp 1999   small polyp  . Diverticulosis   . Dysrhythmia   . Fibromyalgia   . Hyperlipidemia   . Hypothyroidism   . Internal hemorrhoids   . Leg cramps   . Lung nodule    stable LUL 9 mm  . Menopausal syndrome   . Osteoarthritis   . UTI (urinary tract infection)     Past Surgical History:  Procedure Laterality Date  . Abd U/S  11/1998   negative  . Abd U/S  01/2001   gallbladder polyps  . ABDOMINAL HYSTERECTOMY  1975   total-fibroids  . APPENDECTOMY  1975  . BREAST BIOPSY     benign  . BREAST EXCISIONAL BIOPSY Left 1957  . CARDIOVERSION N/A 03/07/2016   Procedure: CARDIOVERSION;  Surgeon: Adrian Prows, MD;  Location: [place];  Service: Cardiovascular;  Laterality: N/A;  . CATARACT EXTRACTION     bilateral  . CHOLECYSTECTOMY    . COLONOSCOPY  1998   Diverticulosis; polyp\  . COLONOSCOPY  09/2002   Diverticulosis, hem  . COLONOSCOPY  12/2007   diverticulosis, polyp  . DEXA  10/2001   osteopenia  . ELECTROPHYSIOLOGIC STUDY N/A 04/03/2016   Procedure: A-Flutter Ablation;  Surgeon: Will Meredith Leeds, MD;  Location: [place] CV LAB;  Service: Cardiovascular;  Laterality: N/A;  . ESOPHAGOGASTRODUODENOSCOPY  2004  . Hida  scan  01/2001   Negative  . KNEE SURGERY     right knee / 11/2004  . LAMINECTOMY WITH POSTERIOR LATERAL ARTHRODESIS LEVEL 2 N/A 07/31/2017   Procedure: DECOMPRESSIVE LAMINECTOMY LUMABR FOUR-FIVE, LUMBAR FIVE-SACRAL ONE;  Surgeon: Eustace Moore, MD;  Location: [place];  Service: Neurosurgery;  Laterality: N/A;  . laser surgery for glaucoma Left Sep 21, 2014  . ROTATOR CUFF REPAIR     x 2 /right shoulder  . SKIN CANCER EXCISION     pre-melanoma / on face  . TEE WITHOUT CARDIOVERSION N/A 03/07/2016   Procedure: TRANSESOPHAGEAL ECHOCARDIOGRAM (TEE);  Surgeon: Adrian Prows, MD;  Location: [place];  Service: Cardiovascular;  Laterality: N/A;  . TOE SURGERY     rt foot     There were no vitals filed for this visit.  Subjective Assessment - 06/24/18 1442    Subjective  Doing well.  Walking felt good    Currently in Pain?  Yes    Pain Score  3     Pain Location  Back    Pain Orientation  Right;Lower                       [facility] Adult PT  [place] [place], [place], [address] Phone: [phone]   Fax:  [phone]  Physical Therapy Treatment  Patient Details  Name: Linda Mcgrath MRN: [id number] Date of Birth: 11-Jan-1933 Referring Provider (PT): [name]   Encounter Date: 06/24/2018  PT End of Session - 06/24/18 1513    Visit Number  9    Date for PT Re-Evaluation  07/20/18    PT Start Time  1433    PT Stop Time  1527    PT Time Calculation (min)  54 min    Activity Tolerance  Patient tolerated treatment well    Behavior During Therapy  [facility] for tasks assessed/performed       Past Medical History:  Diagnosis Date  . Allergic rhinitis   . Asthma   . Cataract    Bil/lens implant  . Colon polyp 1999   small polyp  . Diverticulosis   . Dysrhythmia   . Fibromyalgia   . Hyperlipidemia   . Hypothyroidism   . Internal hemorrhoids   . Leg cramps   . Lung nodule    stable LUL 9 mm  . Menopausal syndrome   . Osteoarthritis   . UTI (urinary tract infection)     Past Surgical History:  Procedure Laterality Date  . Abd U/S  11/1998   negative  . Abd U/S  01/2001   gallbladder polyps  . ABDOMINAL HYSTERECTOMY  1975   total-fibroids  . APPENDECTOMY  1975  . BREAST BIOPSY     benign  . BREAST EXCISIONAL BIOPSY Left 1957  . CARDIOVERSION N/A 03/07/2016   Procedure: CARDIOVERSION;  Surgeon: Adrian Prows, MD;  Location: [place];  Service: Cardiovascular;  Laterality: N/A;  . CATARACT EXTRACTION     bilateral  . CHOLECYSTECTOMY    . COLONOSCOPY  1998   Diverticulosis; polyp\  . COLONOSCOPY  09/2002   Diverticulosis, hem  . COLONOSCOPY  12/2007   diverticulosis, polyp  . DEXA  10/2001   osteopenia  . ELECTROPHYSIOLOGIC STUDY N/A 04/03/2016   Procedure: A-Flutter Ablation;  Surgeon: Will Meredith Leeds, MD;  Location: [place] CV LAB;  Service: Cardiovascular;  Laterality: N/A;  . ESOPHAGOGASTRODUODENOSCOPY  2004  . Hida  scan  01/2001   Negative  . KNEE SURGERY     right knee / 11/2004  . LAMINECTOMY WITH POSTERIOR LATERAL ARTHRODESIS LEVEL 2 N/A 07/31/2017   Procedure: DECOMPRESSIVE LAMINECTOMY LUMABR FOUR-FIVE, LUMBAR FIVE-SACRAL ONE;  Surgeon: Eustace Moore, MD;  Location: [place];  Service: Neurosurgery;  Laterality: N/A;  . laser surgery for glaucoma Left Sep 21, 2014  . ROTATOR CUFF REPAIR     x 2 /right shoulder  . SKIN CANCER EXCISION     pre-melanoma / on face  . TEE WITHOUT CARDIOVERSION N/A 03/07/2016   Procedure: TRANSESOPHAGEAL ECHOCARDIOGRAM (TEE);  Surgeon: Adrian Prows, MD;  Location: [place];  Service: Cardiovascular;  Laterality: N/A;  . TOE SURGERY     rt foot     There were no vitals filed for this visit.  Subjective Assessment - 06/24/18 1442    Subjective  Doing well.  Walking felt good    Currently in Pain?  Yes    Pain Score  3     Pain Location  Back    Pain Orientation  Right;Lower                       [facility] Adult PT  08/24/2008  . Vitamin D deficiency 06/06/2008  . Hyperlipidemia 06/06/2008  . MENOPAUSAL SYNDROME 03/29/2008  . Osteopenia 03/29/2008  . ALLERGIC RHINITIS 01/25/2008  . IBS 01/25/2008  . OSTEOARTHRITIS 01/25/2008  . FIBROMYALGIA 01/25/2008    [name], PT 06/24/2018, 3:15 PM  [address] [place], [place], [address] Phone: [phone]   Fax:  [phone]  Name: Linda Mcgrath MRN: [id number] Date of Birth: [birth year]

## 2018-06-29 ENCOUNTER — Ambulatory Visit: Payer: Medicare Other | Attending: Family Medicine | Admitting: Physical Therapy

## 2018-06-29 ENCOUNTER — Encounter: Payer: Self-pay | Admitting: Physical Therapy

## 2018-06-29 DIAGNOSIS — M62830 Muscle spasm of back: Secondary | ICD-10-CM | POA: Insufficient documentation

## 2018-06-29 DIAGNOSIS — M5441 Lumbago with sciatica, right side: Secondary | ICD-10-CM | POA: Insufficient documentation

## 2018-06-29 DIAGNOSIS — R262 Difficulty in walking, not elsewhere classified: Secondary | ICD-10-CM | POA: Insufficient documentation

## 2018-06-29 NOTE — Therapy (Signed)
Hyperlipidemia 06/06/2008  . MENOPAUSAL SYNDROME 03/29/2008  . Osteopenia 03/29/2008  . ALLERGIC RHINITIS 01/25/2008  . IBS 01/25/2008  . OSTEOARTHRITIS 01/25/2008  . FIBROMYALGIA 01/25/2008    [name], PT 06/29/2018, 2:39 PM  [address] [place], [place], [address] Phone: [phone]   Fax:  [phone]  Name: Linda Mcgrath MRN: [id number] Date of Birth: 1933-05-14  [place], [place], [address] Phone: [phone]   Fax:  [phone]  Physical Therapy Treatment  Patient Details  Name: Linda Mcgrath MRN: [id number] Date of Birth: [birth year] Referring Provider (PT): [name]   Encounter Date: 06/29/2018  PT End of Session - 06/29/18 1436    Visit Number  10    Date for PT Re-Evaluation  07/20/18    PT Start Time  0034    PT Stop Time  1445    PT Time Calculation (min)  49 min    Activity Tolerance  Patient tolerated treatment well    Behavior During Therapy  [facility] for tasks assessed/performed       Past Medical History:  Diagnosis Date  . Allergic rhinitis   . Asthma   . Cataract    Bil/lens implant  . Colon polyp 1999   small polyp  . Diverticulosis   . Dysrhythmia   . Fibromyalgia   . Hyperlipidemia   . Hypothyroidism   . Internal hemorrhoids   . Leg cramps   . Lung nodule    stable LUL 9 mm  . Menopausal syndrome   . Osteoarthritis   . UTI (urinary tract infection)     Past Surgical History:  Procedure Laterality Date  . Abd U/S  11/1998   negative  . Abd U/S  01/2001   gallbladder polyps  . ABDOMINAL HYSTERECTOMY  1975   total-fibroids  . APPENDECTOMY  1975  . BREAST BIOPSY     benign  . BREAST EXCISIONAL BIOPSY Left 1957  . CARDIOVERSION N/A 03/07/2016   Procedure: CARDIOVERSION;  Surgeon: Adrian Prows, MD;  Location: [place];  Service: Cardiovascular;  Laterality: N/A;  . CATARACT EXTRACTION     bilateral  . CHOLECYSTECTOMY    . COLONOSCOPY  1998   Diverticulosis; polyp\  . COLONOSCOPY  09/2002   Diverticulosis, hem  . COLONOSCOPY  12/2007   diverticulosis, polyp  . DEXA  10/2001   osteopenia  . ELECTROPHYSIOLOGIC STUDY N/A 04/03/2016   Procedure: A-Flutter Ablation;  Surgeon: Will Meredith Leeds, MD;  Location: [place] CV LAB;  Service: Cardiovascular;  Laterality: N/A;  . ESOPHAGOGASTRODUODENOSCOPY  2004  . Hida  scan  01/2001   Negative  . KNEE SURGERY     right knee / 11/2004  . LAMINECTOMY WITH POSTERIOR LATERAL ARTHRODESIS LEVEL 2 N/A 07/31/2017   Procedure: DECOMPRESSIVE LAMINECTOMY LUMABR FOUR-FIVE, LUMBAR FIVE-SACRAL ONE;  Surgeon: Eustace Moore, MD;  Location: [place];  Service: Neurosurgery;  Laterality: N/A;  . laser surgery for glaucoma Left Sep 21, 2014  . ROTATOR CUFF REPAIR     x 2 /right shoulder  . SKIN CANCER EXCISION     pre-melanoma / on face  . TEE WITHOUT CARDIOVERSION N/A 03/07/2016   Procedure: TRANSESOPHAGEAL ECHOCARDIOGRAM (TEE);  Surgeon: Adrian Prows, MD;  Location: [place];  Service: Cardiovascular;  Laterality: N/A;  . TOE SURGERY     rt foot     There were no vitals filed for this visit.  Subjective Assessment - 06/29/18 1411    Subjective  Patient reports that since the weather change she is having more right knee pain    Currently in Pain?  Yes    Pain Score  3     Pain Location  Back   right knee 5/10   Pain Descriptors / Indicators  Sore;Aching    Aggravating Factors   weather  Hyperlipidemia 06/06/2008  . MENOPAUSAL SYNDROME 03/29/2008  . Osteopenia 03/29/2008  . ALLERGIC RHINITIS 01/25/2008  . IBS 01/25/2008  . OSTEOARTHRITIS 01/25/2008  . FIBROMYALGIA 01/25/2008    [name], PT 06/29/2018, 2:39 PM  [address] [place], [place], [address] Phone: [phone]   Fax:  [phone]  Name: Linda Mcgrath MRN: [id number] Date of Birth: 1933-05-14

## 2018-07-01 ENCOUNTER — Ambulatory Visit: Payer: Medicare Other | Admitting: Physical Therapy

## 2018-07-01 ENCOUNTER — Encounter: Payer: Self-pay | Admitting: Physical Therapy

## 2018-07-01 DIAGNOSIS — R262 Difficulty in walking, not elsewhere classified: Secondary | ICD-10-CM

## 2018-07-01 DIAGNOSIS — M62830 Muscle spasm of back: Secondary | ICD-10-CM | POA: Diagnosis not present

## 2018-07-01 DIAGNOSIS — M5441 Lumbago with sciatica, right side: Secondary | ICD-10-CM | POA: Diagnosis not present

## 2018-07-01 NOTE — Therapy (Signed)
Visit Diagnosis: Acute  right-sided low back pain with right-sided sciatica  Muscle spasm of back  Difficulty in walking, not elsewhere classified     Problem List Patient Active Problem List   Diagnosis Date Noted  . S/P lumbar spinal fusion 07/31/2017  . Right low back pain 03/04/2017  . Arthropod bite 01/28/2017  . Atrial flutter ([place]) 03/06/2016  . Obesity 11/06/2015  . Urge incontinence 01/24/2015  . Estrogen deficiency 10/25/2014  . Lichen planus 49/70/2637  . Encounter for Medicare annual wellness exam 06/17/2013  . Screening mammogram, encounter for 06/15/2012  . Hyperglycemia 06/03/2011  . HYPERTENSION, BENIGN ESSENTIAL 10/23/2010  . CONSTIPATION 10/23/2010  . HEMATOCHEZIA 10/23/2010  . OSTEOARTHRITIS, HANDS, BILATERAL 01/24/2010  . Hypothyroidism 08/24/2008  . Vitamin D deficiency 06/06/2008  . Hyperlipidemia 06/06/2008  . MENOPAUSAL SYNDROME 03/29/2008  . Osteopenia 03/29/2008  . ALLERGIC RHINITIS 01/25/2008  . IBS 01/25/2008  . OSTEOARTHRITIS 01/25/2008  . FIBROMYALGIA 01/25/2008    [name], PT 07/01/2018, 2:32 PM  [address] [place], [place], [address] Phone: [phone]   Fax:  [phone]  Name: Linda Mcgrath MRN: [id number] Date of Birth: 1933/08/05  Visit Diagnosis: Acute  right-sided low back pain with right-sided sciatica  Muscle spasm of back  Difficulty in walking, not elsewhere classified     Problem List Patient Active Problem List   Diagnosis Date Noted  . S/P lumbar spinal fusion 07/31/2017  . Right low back pain 03/04/2017  . Arthropod bite 01/28/2017  . Atrial flutter ([place]) 03/06/2016  . Obesity 11/06/2015  . Urge incontinence 01/24/2015  . Estrogen deficiency 10/25/2014  . Lichen planus 49/70/2637  . Encounter for Medicare annual wellness exam 06/17/2013  . Screening mammogram, encounter for 06/15/2012  . Hyperglycemia 06/03/2011  . HYPERTENSION, BENIGN ESSENTIAL 10/23/2010  . CONSTIPATION 10/23/2010  . HEMATOCHEZIA 10/23/2010  . OSTEOARTHRITIS, HANDS, BILATERAL 01/24/2010  . Hypothyroidism 08/24/2008  . Vitamin D deficiency 06/06/2008  . Hyperlipidemia 06/06/2008  . MENOPAUSAL SYNDROME 03/29/2008  . Osteopenia 03/29/2008  . ALLERGIC RHINITIS 01/25/2008  . IBS 01/25/2008  . OSTEOARTHRITIS 01/25/2008  . FIBROMYALGIA 01/25/2008    [name], PT 07/01/2018, 2:32 PM  [address] [place], [place], [address] Phone: [phone]   Fax:  [phone]  Name: Linda Mcgrath MRN: [id number] Date of Birth: 1933/08/05  [address] [place], [place], [address] Phone: [phone]   Fax:  [phone]  Physical Therapy Treatment  Patient Details  Name: Linda Mcgrath MRN: [id number] Date of Birth: 1933/03/31 Referring Provider (PT): [name]   Encounter Date: 07/01/2018  PT End of Session - 07/01/18 1424    Visit Number  11    Date for PT Re-Evaluation  07/20/18    PT Start Time  4332    PT Stop Time  1435    PT Time Calculation (min)  61 min    Activity Tolerance  Patient tolerated treatment well    Behavior During Therapy  [facility] for tasks assessed/performed       Past Medical History:  Diagnosis Date  . Allergic rhinitis   . Asthma   . Cataract    Bil/lens implant  . Colon polyp 1999   small polyp  . Diverticulosis   . Dysrhythmia   . Fibromyalgia   . Hyperlipidemia   . Hypothyroidism   . Internal hemorrhoids   . Leg cramps   . Lung nodule    stable LUL 9 mm  . Menopausal syndrome   . Osteoarthritis   . UTI (urinary tract infection)     Past Surgical History:  Procedure Laterality Date  . Abd U/S  11/1998   negative  . Abd U/S  01/2001   gallbladder polyps  . ABDOMINAL HYSTERECTOMY  1975   total-fibroids  . APPENDECTOMY  1975  . BREAST BIOPSY     benign  . BREAST EXCISIONAL BIOPSY Left 1957  . CARDIOVERSION N/A 03/07/2016   Procedure: CARDIOVERSION;  Surgeon: Adrian Prows, MD;  Location: [place];  Service: Cardiovascular;  Laterality: N/A;  . CATARACT EXTRACTION     bilateral  . CHOLECYSTECTOMY    . COLONOSCOPY  1998   Diverticulosis; polyp\  . COLONOSCOPY  09/2002   Diverticulosis, hem  . COLONOSCOPY  12/2007   diverticulosis, polyp  . DEXA  10/2001   osteopenia  . ELECTROPHYSIOLOGIC STUDY N/A 04/03/2016   Procedure: A-Flutter Ablation;  Surgeon: Will Meredith Leeds, MD;  Location: [place] [facility];  Service: Cardiovascular;  Laterality: N/A;  . ESOPHAGOGASTRODUODENOSCOPY  2004  . Hida  scan  01/2001   Negative  . KNEE SURGERY     right knee / 11/2004  . LAMINECTOMY WITH POSTERIOR LATERAL ARTHRODESIS LEVEL 2 N/A 07/31/2017   Procedure: DECOMPRESSIVE LAMINECTOMY LUMABR FOUR-FIVE, LUMBAR FIVE-SACRAL ONE;  Surgeon: Eustace Moore, MD;  Location: [facility];  Service: Neurosurgery;  Laterality: N/A;  . laser surgery for glaucoma Left Sep 21, 2014  . ROTATOR CUFF REPAIR     x 2 /right shoulder  . SKIN CANCER EXCISION     pre-melanoma / on face  . TEE WITHOUT CARDIOVERSION N/A 03/07/2016   Procedure: TRANSESOPHAGEAL ECHOCARDIOGRAM (TEE);  Surgeon: Adrian Prows, MD;  Location: [facility];  Service: Cardiovascular;  Laterality: N/A;  . TOE SURGERY     rt foot     There were no vitals filed for this visit.  Subjective Assessment - 07/01/18 1341    Subjective  Patient reports that she is having some stomach issues with IBS today, reports back is feeling better    Currently in Pain?  Yes    Pain Score  2     Pain Location  Back    Pain Orientation  Lower

## 2018-07-03 ENCOUNTER — Ambulatory Visit (INDEPENDENT_AMBULATORY_CARE_PROVIDER_SITE_OTHER): Payer: Medicare Other | Admitting: Family Medicine

## 2018-07-03 ENCOUNTER — Encounter: Payer: Self-pay | Admitting: Family Medicine

## 2018-07-03 VITALS — BP 132/78 | HR 66 | Temp 98.0°F | Ht 63.0 in | Wt 180.5 lb

## 2018-07-03 DIAGNOSIS — R197 Diarrhea, unspecified: Secondary | ICD-10-CM | POA: Diagnosis not present

## 2018-07-03 DIAGNOSIS — R1013 Epigastric pain: Secondary | ICD-10-CM | POA: Insufficient documentation

## 2018-07-03 MED ORDER — FAMOTIDINE 20 MG PO TABS: 20.0000 mg | ORAL_TABLET | Freq: Two times a day (BID) | ORAL | 3 refills | Status: DC

## 2018-07-03 NOTE — Assessment & Plan Note (Signed)
In pt with h/o c diff (treated) in march Re check this  Also IBS-trial of fiber prn  Diet discussed  Tics seen on last cs- but no abd pain or fever Also some dyspepsia-will tx with pepcid and check in

## 2018-07-03 NOTE — Patient Instructions (Addendum)
Get some citrucel (fiber supplement)- try it once a day to bulk up stool We will check you for c diff again   Make sure to keep up with fluids    I think you may have some acid reflux  Try pepcid 20 mg _I will send that in  Try it for 2-4 weeks and let me know if it does not help

## 2018-07-03 NOTE — Assessment & Plan Note (Signed)
With belching/acid regurgitation and cough  Trial of pepcid 20 mg bid Handout given for GERD and GERD diet  Avoid nsaids/ acids  Update if not starting to improve in a week or if worsening

## 2018-07-03 NOTE — Progress Notes (Signed)
Subjective:    Patient ID: Linda Mcgrath, female    DOB: June 22, 1933, 84 y.o.   MRN: [id number]  HPI Here for GI symptoms (bloating/diarrhea)  Also cough   2 weeks ago - noted she just did not feel well  She had prev been constipated and took a laxative  Bloating and a lot of gas /flatus  She tried to avoid gassy foods Not as hungry as she used to be (also smaller portions) Eats oatmeal    Frequent stools (loose)  3-4 bm per day  Brow to yellow color  No blood  No fever   Some pressure up under R ribs  (this happened after eating) Pressure advances into upper abdomen later in the day  Then a lot of burping and some cough  ? GERd   She started drinking buttermilk and eating plain yogurt  About 3-4 days -thinks it has helped the gas a little  Tomatoes make it worse  Alka selzer helped  No abx recently   Had c diff in 3/19  tx with flagyl   Son was treated for GI disorder (lives with him) -he also has renal failure and peritonitis in the past   She has had ccy and also hida scan in the past   Some fatigue - doing PT for her back and it is helping   Lab Results  Component Value Date   TSH 4.41 11/17/2017    Patient Active Problem List   Diagnosis Date Noted  . Diarrhea 07/03/2018  . Dyspepsia 07/03/2018  . S/P lumbar spinal fusion 07/31/2017  . Right low back pain 03/04/2017  . Arthropod bite 01/28/2017  . Atrial flutter ([facility]) 03/06/2016  . Obesity 11/06/2015  . Urge incontinence 01/24/2015  . Estrogen deficiency 10/25/2014  . Lichen planus 12/20/2013  . Encounter for Medicare annual wellness exam 06/17/2013  . Screening mammogram, encounter for 06/15/2012  . Hyperglycemia 06/03/2011  . HYPERTENSION, BENIGN ESSENTIAL 10/23/2010  . CONSTIPATION 10/23/2010  . HEMATOCHEZIA 10/23/2010  . OSTEOARTHRITIS, HANDS, BILATERAL 01/24/2010  . Hypothyroidism 08/24/2008  . Vitamin D deficiency 06/06/2008  . Hyperlipidemia 06/06/2008  . MENOPAUSAL SYNDROME  03/29/2008  . Osteopenia 03/29/2008  . ALLERGIC RHINITIS 01/25/2008  . IBS 01/25/2008  . OSTEOARTHRITIS 01/25/2008  . FIBROMYALGIA 01/25/2008   Past Medical History:  Diagnosis Date  . Allergic rhinitis   . Asthma   . Cataract    Bil/lens implant  . Colon polyp 1999   small polyp  . Diverticulosis   . Dysrhythmia   . Fibromyalgia   . Hyperlipidemia   . Hypothyroidism   . Internal hemorrhoids   . Leg cramps   . Lung nodule    stable LUL 9 mm  . Menopausal syndrome   . Osteoarthritis   . UTI (urinary tract infection)    Past Surgical History:  Procedure Laterality Date  . Abd U/S  11/1998   negative  . Abd U/S  01/2001   gallbladder polyps  . ABDOMINAL HYSTERECTOMY  1975   total-fibroids  . APPENDECTOMY  1975  . BREAST BIOPSY     benign  . BREAST EXCISIONAL BIOPSY Left 1957  . CARDIOVERSION N/A 03/07/2016   Procedure: CARDIOVERSION;  Surgeon: Yates Decamp, MD;  Location: [facility] END[facility];  Service: Cardiovascular;  Laterality: N/A;  . CATARACT EXTRACTION     bilateral  . CHOLECYSTECTOMY    . COLONOSCOPY  1998   Diverticulosis; polyp\  . COLONOSCOPY  09/2002   Diverticulosis, hem  . COLONOSCOPY  12/2007   diverticulosis, polyp  . DEXA  10/2001   osteopenia  . ELECTROPHYSIOLOGIC STUDY N/A 04/03/2016   Procedure: A-Flutter Ablation;  Surgeon: Will [name], MD;  [facility];  Service: Cardiovascular;  Laterality: N/A;  . ESOPHAGOGASTRODUODENOSCOPY  2004  . Hida scan  01/2001   Negative  . KNEE SURGERY     right knee / 11/2004  . LAMINECTOMY WITH POSTERIOR LATERAL ARTHRODESIS LEVEL 2 N/A 07/31/2017   Procedure: DECOMPRESSIVE LAMINECTOMY LUMABR FOUR-FIVE, LUMBAR FIVE-SACRAL ONE;  Surgeon: Tia Alert, MD;  Location: [facility];  Service: Neurosurgery;  Laterality: N/A;  . laser surgery for glaucoma Left Sep 21, 2014  . ROTATOR CUFF REPAIR     x 2 /right shoulder  . SKIN CANCER EXCISION     pre-melanoma / on face  . TEE WITHOUT CARDIOVERSION N/A  03/07/2016   Procedure: TRANSESOPHAGEAL ECHOCARDIOGRAM (TEE);  Surgeon: Yates Decamp, MD;  Location: [facility] ENDOSCOPY;  Service: Cardiovascular;  Laterality: N/A;  . TOE SURGERY     rt foot    Social History   Tobacco Use  . Smoking status: Never Smoker  . Smokeless tobacco: Never Used  Substance Use Topics  . Alcohol use: No    Alcohol/week: 0.0 standard drinks    Comment: occasional-rare  . Drug use: No   Family History  Problem Relation Age of Onset  . Parkinsonism Father   . Colon cancer Brother   . Allergies Mother   . Heart disease Mother   . Kidney failure Son        congenital   Allergies  Allergen Reactions  . Shellfish Allergy Anaphylaxis and Nausea And Vomiting  . Tetracycline Hives, Nausea And Vomiting, Rash and Other (See Comments)    SYSTEMIC REACTION: heart palpitations,muscle spasms, vomiting  . Amlodipine Besylate Other (See Comments)    REACTION: muscle spasms, extremity swelling, low bp  . Ciprofloxacin Other (See Comments)    REACTION: muscle spasms, insomnia  . Codeine Other (See Comments)    REACTION: hallucinations  . Propoxyphene Hcl Other (See Comments)    Unknown  . Sulfamethoxazole-Trimethoprim Nausea Only and Other (See Comments)    REACTION: dizzy, could not focus eyes and could not concentrate and nausea  . Tape Other (See Comments)    BANDAIDS TAKE OFF HER SKIN; PLEASE USE COBAN OR PAPER   . Xarelto [Rivaroxaban] Other (See Comments)    Aches and pains and nervousness  . Amoxicillin Rash  . 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 Penicillins Swelling, Rash and Other (See Comments)    Has patient had a PCN reaction causing immediate rash, facial/tongue/throat swelling, SOB or lightheadedness with hypotension: Yes Has patient had a PCN reaction causing severe rash involving mucus  membranes or skin necrosis: No Has patient had a PCN reaction that required hospitalization No Has patient had a PCN reaction occurring within the last 10 years: No If all of the above answers are "NO", then may proceed with Cephalosporin use.    Current Outpatient Medications on File Prior to Visit  Medication Sig Dispense Refill  . Ascorbic Acid (VITAMIN C) 1000 MG tablet Take 1,000 mg by mouth daily.      . AZO-CRANBERRY PO Take 1 capsule by mouth 2 (two) times daily.    . B Complex Vitamins (  VITAMIN B COMPLEX PO) Take 1 tablet by mouth daily.    . Biotin 10 MG TABS Take 1 tablet by mouth every morning.     . Calcium Citrate-Vitamin D (CALCIUM CITRATE + D) 250-200 MG-UNIT TABS Take 1 tablet by mouth daily.    . cholecalciferol (VITAMIN D) 1000 units tablet Take 1,000 Units by mouth daily.    . cyclobenzaprine (FLEXERIL) 10 MG tablet Take 0.5-1 tablets (5-10 mg total) by mouth at bedtime as needed. 90 tablet 1  . estradiol (ESTRACE) 1 MG tablet Take 1 tablet (1 mg total) by mouth daily. 90 tablet 3  . Fexofenadine HCl (MUCINEX ALLERGY PO) Take 1 tablet by mouth daily as needed (Allergies).     . fluticasone (FLONASE) 50 MCG/ACT nasal spray PLACE 2 SPRAYS INTO BOTH NOSTRILS DAILY AS NEEDED FOR ALLERGIES. 16 g 2  . folic acid (FOLVITE) 1 MG tablet Take 1 mg by mouth daily.    . Homeopathic Products (LEG CRAMPS PM SL) Place 2 tablets under the tongue at bedtime.    Marland Kitchen levothyroxine (SYNTHROID, LEVOTHROID) 88 MCG tablet Take 1 tablet (88 mcg total) by mouth daily with breakfast. 90 tablet 3  . loratadine (CLARITIN) 10 MG tablet Take 10 mg by mouth daily as needed for allergies.     Marland Kitchen MAGNESIUM-OXIDE PO Take 1 tablet by mouth daily.    . montelukast (SINGULAIR) 10 MG tablet Take 1 tablet (10 mg total) by mouth at bedtime. 90 tablet 3  . Multiple Vitamin (MULTIVITAMIN) capsule Take 1 capsule by mouth daily.      . TURMERIC PO Take 1 capsule by mouth 2 (two) times daily.     . vitamin B-12  (CYANOCOBALAMIN) 500 MCG tablet Take 500 mcg by mouth daily.     No current facility-administered medications on file prior to visit.        Last colonoscopy 7/14 - tics and one polyp    Review of Systems  Constitutional: Negative for activity change, appetite change, fatigue, fever and unexpected weight change.  HENT: Negative for congestion, ear pain, rhinorrhea, sinus pressure and sore throat.   Eyes: Negative for pain, redness and visual disturbance.  Respiratory: Positive for cough. Negative for shortness of breath and wheezing.   Cardiovascular: Negative for chest pain and palpitations.  Gastrointestinal: Positive for abdominal distention and diarrhea. Negative for abdominal pain, anal bleeding, blood in stool, constipation, nausea, rectal pain and vomiting.       Belching  regurgitation  Endocrine: Negative for polydipsia and polyuria.  Genitourinary: Negative for dysuria, frequency and urgency.  Musculoskeletal: Negative for arthralgias, back pain and myalgias.  Skin: Negative for pallor and rash.  Allergic/Immunologic: Negative for environmental allergies.  Neurological: Negative for dizziness, syncope and headaches.  Hematological: Negative for adenopathy. Does not bruise/bleed easily.  Psychiatric/Behavioral: Negative for decreased concentration and dysphoric mood. The patient is not nervous/anxious.        Objective:   Physical Exam  Constitutional: She appears well-developed and well-nourished. No distress.  obese and well appearing   HENT:  Head: Normocephalic and atraumatic.  Mouth/Throat: Oropharynx is clear and moist. No oropharyngeal exudate.  Eyes: Pupils are equal, round, and reactive to light. Conjunctivae and EOM are normal. No scleral icterus.  Neck: Normal range of motion. Neck supple.  Cardiovascular: Normal rate, regular rhythm and normal heart sounds.  Pulmonary/Chest: Effort normal and breath sounds normal. No stridor. No respiratory distress. She  has no wheezes. She has no rales.  Good air exch No  rales/wheeze  Abdominal: Soft. Bowel sounds are normal. She exhibits no distension, no abdominal bruit, no pulsatile midline mass and no mass. There is no hepatosplenomegaly. There is tenderness in the epigastric area and left upper quadrant. There is no rebound, no guarding, no tenderness at McBurney's point and negative Murphy's sign.  Lymphadenopathy:    She has no cervical adenopathy.  Neurological: She is alert. She displays normal reflexes. No cranial nerve deficit. Coordination normal.  Skin: Skin is warm and dry. No rash noted. No erythema. No pallor.  Psychiatric: She has a normal mood and affect.          Assessment & Plan:   Problem List Items Addressed This Visit      Other   Diarrhea - Primary    In pt with h/o c diff (treated) in march Re check this  Also IBS-trial of fiber prn  Diet discussed  Tics seen on last cs- but no abd pain or fever Also some dyspepsia-will tx with pepcid and check in       Relevant Orders   C. difficile GDH and Toxin A/B   Dyspepsia    With belching/acid regurgitation and cough  Trial of pepcid 20 mg bid Handout given for GERD and GERD diet  Avoid nsaids/ acids  Update if not starting to improve in a week or if worsening

## 2018-07-04 LAB — C. DIFFICILE GDH AND TOXIN A/B
GDH ANTIGEN: NOT DETECTED
MICRO NUMBER:: 91348267
SPECIMEN QUALITY:: ADEQUATE
TOXIN A AND B: NOT DETECTED

## 2018-07-06 ENCOUNTER — Ambulatory Visit: Payer: Medicare Other | Admitting: Physical Therapy

## 2018-07-06 ENCOUNTER — Encounter: Payer: Self-pay | Admitting: Physical Therapy

## 2018-07-06 DIAGNOSIS — R262 Difficulty in walking, not elsewhere classified: Secondary | ICD-10-CM | POA: Diagnosis not present

## 2018-07-06 DIAGNOSIS — M62830 Muscle spasm of back: Secondary | ICD-10-CM

## 2018-07-06 DIAGNOSIS — M5441 Lumbago with sciatica, right side: Secondary | ICD-10-CM | POA: Diagnosis not present

## 2018-07-06 NOTE — Therapy (Signed)
[place] [address] [place], [place], [address] Phone: [phone]   Fax:  [phone]  Physical Therapy Treatment  Patient Details  Name: Linda Mcgrath MRN: [id number] Date of Birth: [birth year] Referring Provider (PT): [name]   Encounter Date: 07/06/2018  PT End of Session - 07/06/18 1549    Visit Number  12    Date for PT Re-Evaluation  07/20/18    PT Start Time  1510    PT Stop Time  1600    PT Time Calculation (min)  50 min    Activity Tolerance  Patient tolerated treatment well    Behavior During Therapy  [facility] for tasks assessed/performed       Past Medical History:  Diagnosis Date  . Allergic rhinitis   . Asthma   . Cataract    Bil/lens implant  . Colon polyp 1999   small polyp  . Diverticulosis   . Dysrhythmia   . Fibromyalgia   . Hyperlipidemia   . Hypothyroidism   . Internal hemorrhoids   . Leg cramps   . Lung nodule    stable LUL 9 mm  . Menopausal syndrome   . Osteoarthritis   . UTI (urinary tract infection)     Past Surgical History:  Procedure Laterality Date  . Abd U/S  11/1998   negative  . Abd U/S  01/2001   gallbladder polyps  . ABDOMINAL HYSTERECTOMY  1975   total-fibroids  . APPENDECTOMY  1975  . BREAST BIOPSY     benign  . BREAST EXCISIONAL BIOPSY Left 1957  . CARDIOVERSION N/A 03/07/2016   Procedure: CARDIOVERSION;  Surgeon: Adrian Prows, MD;  Location: [facility];  Service: Cardiovascular;  Laterality: N/A;  . CATARACT EXTRACTION     bilateral  . CHOLECYSTECTOMY    . COLONOSCOPY  1998   Diverticulosis; polyp\  . COLONOSCOPY  09/2002   Diverticulosis, hem  . COLONOSCOPY  12/2007   diverticulosis, polyp  . DEXA  10/2001   osteopenia  . ELECTROPHYSIOLOGIC STUDY N/A 04/03/2016   Procedure: A-Flutter Ablation;  Surgeon: Will Meredith Leeds, MD;  Location: [place] [facility];  Service: Cardiovascular;  Laterality: N/A;  . ESOPHAGOGASTRODUODENOSCOPY  2004  . Hida  scan  01/2001   Negative  . KNEE SURGERY     right knee / 11/2004  . LAMINECTOMY WITH POSTERIOR LATERAL ARTHRODESIS LEVEL 2 N/A 07/31/2017   Procedure: DECOMPRESSIVE LAMINECTOMY LUMABR FOUR-FIVE, LUMBAR FIVE-SACRAL ONE;  Surgeon: Eustace Moore, MD;  Location: [facility];  Service: Neurosurgery;  Laterality: N/A;  . laser surgery for glaucoma Left Sep 21, 2014  . ROTATOR CUFF REPAIR     x 2 /right shoulder  . SKIN CANCER EXCISION     pre-melanoma / on face  . TEE WITHOUT CARDIOVERSION N/A 03/07/2016   Procedure: TRANSESOPHAGEAL ECHOCARDIOGRAM (TEE);  Surgeon: Adrian Prows, MD;  Location: [facility];  Service: Cardiovascular;  Laterality: N/A;  . TOE SURGERY     rt foot     There were no vitals filed for this visit.  Subjective Assessment - 07/06/18 1547    Subjective  Patient reports that she had to see her MD about the stomach issues and reports that she continues to not feel great    Currently in Pain?  Yes    Pain Score  2     Pain Location  Back    Pain Orientation  Lower    Aggravating Factors   reports that  [place] [address] [place], [place], [address] Phone: [phone]   Fax:  [phone]  Physical Therapy Treatment  Patient Details  Name: Linda Mcgrath MRN: [id number] Date of Birth: [birth year] Referring Provider (PT): [name]   Encounter Date: 07/06/2018  PT End of Session - 07/06/18 1549    Visit Number  12    Date for PT Re-Evaluation  07/20/18    PT Start Time  1510    PT Stop Time  1600    PT Time Calculation (min)  50 min    Activity Tolerance  Patient tolerated treatment well    Behavior During Therapy  [facility] for tasks assessed/performed       Past Medical History:  Diagnosis Date  . Allergic rhinitis   . Asthma   . Cataract    Bil/lens implant  . Colon polyp 1999   small polyp  . Diverticulosis   . Dysrhythmia   . Fibromyalgia   . Hyperlipidemia   . Hypothyroidism   . Internal hemorrhoids   . Leg cramps   . Lung nodule    stable LUL 9 mm  . Menopausal syndrome   . Osteoarthritis   . UTI (urinary tract infection)     Past Surgical History:  Procedure Laterality Date  . Abd U/S  11/1998   negative  . Abd U/S  01/2001   gallbladder polyps  . ABDOMINAL HYSTERECTOMY  1975   total-fibroids  . APPENDECTOMY  1975  . BREAST BIOPSY     benign  . BREAST EXCISIONAL BIOPSY Left 1957  . CARDIOVERSION N/A 03/07/2016   Procedure: CARDIOVERSION;  Surgeon: Adrian Prows, MD;  Location: [facility];  Service: Cardiovascular;  Laterality: N/A;  . CATARACT EXTRACTION     bilateral  . CHOLECYSTECTOMY    . COLONOSCOPY  1998   Diverticulosis; polyp\  . COLONOSCOPY  09/2002   Diverticulosis, hem  . COLONOSCOPY  12/2007   diverticulosis, polyp  . DEXA  10/2001   osteopenia  . ELECTROPHYSIOLOGIC STUDY N/A 04/03/2016   Procedure: A-Flutter Ablation;  Surgeon: Will Meredith Leeds, MD;  Location: [place] [facility];  Service: Cardiovascular;  Laterality: N/A;  . ESOPHAGOGASTRODUODENOSCOPY  2004  . Hida  scan  01/2001   Negative  . KNEE SURGERY     right knee / 11/2004  . LAMINECTOMY WITH POSTERIOR LATERAL ARTHRODESIS LEVEL 2 N/A 07/31/2017   Procedure: DECOMPRESSIVE LAMINECTOMY LUMABR FOUR-FIVE, LUMBAR FIVE-SACRAL ONE;  Surgeon: Eustace Moore, MD;  Location: [facility];  Service: Neurosurgery;  Laterality: N/A;  . laser surgery for glaucoma Left Sep 21, 2014  . ROTATOR CUFF REPAIR     x 2 /right shoulder  . SKIN CANCER EXCISION     pre-melanoma / on face  . TEE WITHOUT CARDIOVERSION N/A 03/07/2016   Procedure: TRANSESOPHAGEAL ECHOCARDIOGRAM (TEE);  Surgeon: Adrian Prows, MD;  Location: [facility];  Service: Cardiovascular;  Laterality: N/A;  . TOE SURGERY     rt foot     There were no vitals filed for this visit.  Subjective Assessment - 07/06/18 1547    Subjective  Patient reports that she had to see her MD about the stomach issues and reports that she continues to not feel great    Currently in Pain?  Yes    Pain Score  2     Pain Location  Back    Pain Orientation  Lower    Aggravating Factors   reports that  S/P lumbar spinal fusion  07/31/2017  . Right low back pain 03/04/2017  . Arthropod bite 01/28/2017  . Atrial flutter ([place]) 03/06/2016  . Obesity 11/06/2015  . Urge incontinence 01/24/2015  . Estrogen deficiency 10/25/2014  . Lichen planus 09/62/8366  . Encounter for Medicare annual wellness exam 06/17/2013  . Screening mammogram, encounter for 06/15/2012  . Hyperglycemia 06/03/2011  . HYPERTENSION, BENIGN ESSENTIAL 10/23/2010  . CONSTIPATION 10/23/2010  . HEMATOCHEZIA 10/23/2010  . OSTEOARTHRITIS, HANDS, BILATERAL 01/24/2010  . Hypothyroidism 08/24/2008  . Vitamin D deficiency 06/06/2008  . Hyperlipidemia 06/06/2008  . MENOPAUSAL SYNDROME 03/29/2008  . Osteopenia 03/29/2008  . ALLERGIC RHINITIS 01/25/2008  . IBS 01/25/2008  . OSTEOARTHRITIS 01/25/2008  . FIBROMYALGIA 01/25/2008    [name], PT 07/06/2018, 3:55 PM  [address] [place], [place], [address] Phone: [phone]   Fax:  [phone]  Name: Linda Mcgrath MRN: [id number] Date of Birth: 01/01/1933

## 2018-07-08 ENCOUNTER — Ambulatory Visit: Payer: Medicare Other | Admitting: Physical Therapy

## 2018-07-08 ENCOUNTER — Encounter: Payer: Self-pay | Admitting: Physical Therapy

## 2018-07-08 DIAGNOSIS — M5441 Lumbago with sciatica, right side: Secondary | ICD-10-CM

## 2018-07-08 DIAGNOSIS — M62830 Muscle spasm of back: Secondary | ICD-10-CM

## 2018-07-08 DIAGNOSIS — R262 Difficulty in walking, not elsewhere classified: Secondary | ICD-10-CM

## 2018-07-08 NOTE — Therapy (Signed)
incontinence 01/24/2015  . Estrogen deficiency 10/25/2014  . Lichen planus 18/40/3754  . Encounter for Medicare annual wellness exam 06/17/2013  . Screening mammogram, encounter for 06/15/2012  . Hyperglycemia 06/03/2011  . HYPERTENSION, BENIGN ESSENTIAL 10/23/2010  . CONSTIPATION 10/23/2010  . HEMATOCHEZIA 10/23/2010  . OSTEOARTHRITIS, HANDS, BILATERAL 01/24/2010  . Hypothyroidism 08/24/2008  . Vitamin D deficiency 06/06/2008  . Hyperlipidemia 06/06/2008  . MENOPAUSAL SYNDROME 03/29/2008  . Osteopenia 03/29/2008  . ALLERGIC RHINITIS 01/25/2008  . IBS 01/25/2008  . OSTEOARTHRITIS 01/25/2008  . FIBROMYALGIA 01/25/2008    [name], PT 07/08/2018, 3:17 PM  [address] [place], [place], [address] Phone: [phone]   Fax:  [phone]  Name: JAMESYN MOOREFIELD MRN: [id number] Date of Birth: 03-20-1933  [place], [place], [address] Phone: [phone]   Fax:  [phone]  Physical Therapy Treatment  Patient Details  Name: Linda Mcgrath MRN: [id number] Date of Birth: 08/22/1933 Referring Provider (PT): [name]   Encounter Date: 07/08/2018  PT End of Session - 07/08/18 1507    Visit Number  13    Date for PT Re-Evaluation  07/20/18    PT Start Time  1657    PT Stop Time  1535    PT Time Calculation (min)  56 min    Activity Tolerance  Patient tolerated treatment well    Behavior During Therapy  [facility] for tasks assessed/performed       Past Medical History:  Diagnosis Date  . Allergic rhinitis   . Asthma   . Cataract    Bil/lens implant  . Colon polyp 1999   small polyp  . Diverticulosis   . Dysrhythmia   . Fibromyalgia   . Hyperlipidemia   . Hypothyroidism   . Internal hemorrhoids   . Leg cramps   . Lung nodule    stable LUL 9 mm  . Menopausal syndrome   . Osteoarthritis   . UTI (urinary tract infection)     Past Surgical History:  Procedure Laterality Date  . Abd U/S  11/1998   negative  . Abd U/S  01/2001   gallbladder polyps  . ABDOMINAL HYSTERECTOMY  1975   total-fibroids  . APPENDECTOMY  1975  . BREAST BIOPSY     benign  . BREAST EXCISIONAL BIOPSY Left 1957  . CARDIOVERSION N/A 03/07/2016   Procedure: CARDIOVERSION;  Surgeon: Adrian Prows, MD;  Location: [facility];  Service: Cardiovascular;  Laterality: N/A;  . CATARACT EXTRACTION     bilateral  . CHOLECYSTECTOMY    . COLONOSCOPY  1998   Diverticulosis; polyp\  . COLONOSCOPY  09/2002   Diverticulosis, hem  . COLONOSCOPY  12/2007   diverticulosis, polyp  . DEXA  10/2001   osteopenia  . ELECTROPHYSIOLOGIC STUDY N/A 04/03/2016   Procedure: A-Flutter Ablation;  Surgeon: Will Meredith Leeds, MD;  Location: [facility];  Service: Cardiovascular;  Laterality: N/A;  . ESOPHAGOGASTRODUODENOSCOPY  2004  . [place]  scan  01/2001   Negative  . KNEE SURGERY     right knee / 11/2004  . LAMINECTOMY WITH POSTERIOR LATERAL ARTHRODESIS LEVEL 2 N/A 07/31/2017   Procedure: DECOMPRESSIVE LAMINECTOMY LUMABR FOUR-FIVE, LUMBAR FIVE-SACRAL ONE;  Surgeon: Eustace Moore, MD;  Location: [facility];  Service: Neurosurgery;  Laterality: N/A;  . laser surgery for glaucoma Left Sep 21, 2014  . ROTATOR CUFF REPAIR     x 2 /right shoulder  . SKIN CANCER EXCISION     pre-melanoma / on face  . TEE WITHOUT CARDIOVERSION N/A 03/07/2016   Procedure: TRANSESOPHAGEAL ECHOCARDIOGRAM (TEE);  Surgeon: Adrian Prows, MD;  Location: [facility];  Service: Cardiovascular;  Laterality: N/A;  . TOE SURGERY     rt foot     There were no vitals filed for this visit.  Subjective Assessment - 07/08/18 1456    Subjective  Patient reports that she is starting to feel a little better, reports that she is more active and walking better    Currently in Pain?  Yes    Pain Score  2     Pain Location  Back    Pain Orientation  Right;Lower    Pain Descriptors / Indicators  Sore  [place], [place], [address] Phone: [phone]   Fax:  [phone]  Physical Therapy Treatment  Patient Details  Name: Linda Mcgrath MRN: [id number] Date of Birth: 08/22/1933 Referring Provider (PT): [name]   Encounter Date: 07/08/2018  PT End of Session - 07/08/18 1507    Visit Number  13    Date for PT Re-Evaluation  07/20/18    PT Start Time  1657    PT Stop Time  1535    PT Time Calculation (min)  56 min    Activity Tolerance  Patient tolerated treatment well    Behavior During Therapy  [facility] for tasks assessed/performed       Past Medical History:  Diagnosis Date  . Allergic rhinitis   . Asthma   . Cataract    Bil/lens implant  . Colon polyp 1999   small polyp  . Diverticulosis   . Dysrhythmia   . Fibromyalgia   . Hyperlipidemia   . Hypothyroidism   . Internal hemorrhoids   . Leg cramps   . Lung nodule    stable LUL 9 mm  . Menopausal syndrome   . Osteoarthritis   . UTI (urinary tract infection)     Past Surgical History:  Procedure Laterality Date  . Abd U/S  11/1998   negative  . Abd U/S  01/2001   gallbladder polyps  . ABDOMINAL HYSTERECTOMY  1975   total-fibroids  . APPENDECTOMY  1975  . BREAST BIOPSY     benign  . BREAST EXCISIONAL BIOPSY Left 1957  . CARDIOVERSION N/A 03/07/2016   Procedure: CARDIOVERSION;  Surgeon: Adrian Prows, MD;  Location: [facility];  Service: Cardiovascular;  Laterality: N/A;  . CATARACT EXTRACTION     bilateral  . CHOLECYSTECTOMY    . COLONOSCOPY  1998   Diverticulosis; polyp\  . COLONOSCOPY  09/2002   Diverticulosis, hem  . COLONOSCOPY  12/2007   diverticulosis, polyp  . DEXA  10/2001   osteopenia  . ELECTROPHYSIOLOGIC STUDY N/A 04/03/2016   Procedure: A-Flutter Ablation;  Surgeon: Will Meredith Leeds, MD;  Location: [facility];  Service: Cardiovascular;  Laterality: N/A;  . ESOPHAGOGASTRODUODENOSCOPY  2004  . [place]  scan  01/2001   Negative  . KNEE SURGERY     right knee / 11/2004  . LAMINECTOMY WITH POSTERIOR LATERAL ARTHRODESIS LEVEL 2 N/A 07/31/2017   Procedure: DECOMPRESSIVE LAMINECTOMY LUMABR FOUR-FIVE, LUMBAR FIVE-SACRAL ONE;  Surgeon: Eustace Moore, MD;  Location: [facility];  Service: Neurosurgery;  Laterality: N/A;  . laser surgery for glaucoma Left Sep 21, 2014  . ROTATOR CUFF REPAIR     x 2 /right shoulder  . SKIN CANCER EXCISION     pre-melanoma / on face  . TEE WITHOUT CARDIOVERSION N/A 03/07/2016   Procedure: TRANSESOPHAGEAL ECHOCARDIOGRAM (TEE);  Surgeon: Adrian Prows, MD;  Location: [facility];  Service: Cardiovascular;  Laterality: N/A;  . TOE SURGERY     rt foot     There were no vitals filed for this visit.  Subjective Assessment - 07/08/18 1456    Subjective  Patient reports that she is starting to feel a little better, reports that she is more active and walking better    Currently in Pain?  Yes    Pain Score  2     Pain Location  Back    Pain Orientation  Right;Lower    Pain Descriptors / Indicators  Sore

## 2018-07-13 ENCOUNTER — Ambulatory Visit: Payer: Medicare Other | Admitting: Physical Therapy

## 2018-07-13 ENCOUNTER — Telehealth: Payer: Self-pay | Admitting: Family Medicine

## 2018-07-13 ENCOUNTER — Encounter: Payer: Self-pay | Admitting: Physical Therapy

## 2018-07-13 DIAGNOSIS — M5441 Lumbago with sciatica, right side: Secondary | ICD-10-CM | POA: Diagnosis not present

## 2018-07-13 DIAGNOSIS — M62830 Muscle spasm of back: Secondary | ICD-10-CM

## 2018-07-13 DIAGNOSIS — R262 Difficulty in walking, not elsewhere classified: Secondary | ICD-10-CM

## 2018-07-13 NOTE — Telephone Encounter (Signed)
Pt notified of Dr. Marliss Coots comments and recommendations and instructions and verbalized understanding. Pt said she will keep an eye on the bloating and gas and also call and schedule an appt if leg swelling comes back, It has resolved as of now

## 2018-07-13 NOTE — Telephone Encounter (Signed)
Patient called to updating how she is doing when famotidine was initiated 07/03/2018. Patient is doing okay. She stated that she is no long having diarrhea. However, she still having some gas and bloating. Also patient wanted to mention that for the last 2 days she had some feet swelling and tender. I did inform patient that the swelling is not related to famotidine but she wanted Dr [name] to know.

## 2018-07-13 NOTE — Telephone Encounter (Signed)
Keep me posted-glad she is doing better (if worse again or no further improvement let me know) If the leg swelling is assoc with any chest pain or sob let me know  Elevate legs Watch sodium intake

## 2018-07-13 NOTE — Therapy (Signed)
[facility] [place] [address] [place], [place], [address] Phone: [phone]   Fax:  [phone]  Physical Therapy Treatment  Patient Details  Name: Linda Mcgrath MRN: [id number] Date of Birth: 12-29-1932 Referring Provider (PT): [name]   Encounter Date: 07/13/2018  PT End of Session - 07/13/18 1518    Visit Number  14    Date for PT Re-Evaluation  07/20/18    PT Start Time  1438    PT Stop Time  1528    PT Time Calculation (min)  50 min    Activity Tolerance  Patient tolerated treatment well    Behavior During Therapy  [facility] for tasks assessed/performed       Past Medical History:  Diagnosis Date  . Allergic rhinitis   . Asthma   . Cataract    Bil/lens implant  . Colon polyp 1999   small polyp  . Diverticulosis   . Dysrhythmia   . Fibromyalgia   . Hyperlipidemia   . Hypothyroidism   . Internal hemorrhoids   . Leg cramps   . Lung nodule    stable LUL 9 mm  . Menopausal syndrome   . Osteoarthritis   . UTI (urinary tract infection)     Past Surgical History:  Procedure Laterality Date  . Abd U/S  11/1998   negative  . Abd U/S  01/2001   gallbladder polyps  . ABDOMINAL HYSTERECTOMY  1975   total-fibroids  . APPENDECTOMY  1975  . BREAST BIOPSY     benign  . BREAST EXCISIONAL BIOPSY Left 1957  . CARDIOVERSION N/A 03/07/2016   Procedure: CARDIOVERSION;  Surgeon: Yates Decamp, MD;  Location: [facility];  Service: Cardiovascular;  Laterality: N/A;  . CATARACT EXTRACTION     bilateral  . CHOLECYSTECTOMY    . COLONOSCOPY  1998   Diverticulosis; polyp\  . COLONOSCOPY  09/2002   Diverticulosis, hem  . COLONOSCOPY  12/2007   diverticulosis, polyp  . DEXA  10/2001   osteopenia  . ELECTROPHYSIOLOGIC STUDY N/A 04/03/2016   Procedure: A-Flutter Ablation;  Surgeon: Will [name], MD;  [facility];  Service: Cardiovascular;  Laterality: N/A;  . ESOPHAGOGASTRODUODENOSCOPY  2004  . [place]  scan  01/2001   Negative  . KNEE SURGERY     right knee / 11/2004  . LAMINECTOMY WITH POSTERIOR LATERAL ARTHRODESIS LEVEL 2 N/A 07/31/2017   Procedure: DECOMPRESSIVE LAMINECTOMY LUMABR FOUR-FIVE, LUMBAR FIVE-SACRAL ONE;  Surgeon: Tia Alert, MD;  Location: [facility];  Service: Neurosurgery;  Laterality: N/A;  . laser surgery for glaucoma Left Sep 21, 2014  . ROTATOR CUFF REPAIR     x 2 /right shoulder  . SKIN CANCER EXCISION     pre-melanoma / on face  . TEE WITHOUT CARDIOVERSION N/A 03/07/2016   Procedure: TRANSESOPHAGEAL ECHOCARDIOGRAM (TEE);  Surgeon: Yates Decamp, MD;  Location: [facility];  Service: Cardiovascular;  Laterality: N/A;  . TOE SURGERY     rt foot     There were no vitals filed for this visit.  Subjective Assessment - 07/13/18 1443    Subjective  Patient reports that she is still battling the indigestion and diahrrea    Currently in Pain?  Yes    Pain Score  3     Pain Location  Back    Pain Orientation  Right;Lower    Aggravating Factors   reports that the stomach issues, the cold and wet weather seems to make  it worse    Pain Relieving Factors  better with treatment                       [facility] Adult PT Treatment/Exercise - 07/13/18 0001      Lumbar Exercises: Stretches   Passive Hamstring Stretch  2 reps;20 seconds    Piriformis Stretch  3 reps;20 seconds      Lumbar Exercises: Aerobic   Nustep  level 4 x 6 minutes      Lumbar Exercises: Standing   Scapular Retraction  20 reps;Theraband    Theraband Level (Scapular Retraction)  Level 2 (Red)    Row  20 reps;Theraband    Theraband Level (Row)  Level 2 (Red)      Lumbar Exercises: Supine   Other Supine Lumbar Exercises  feet on ball K2C, trunk rotation, small bridges and then isometric abs      Moist Heat Therapy   Number Minutes Moist Heat  15 Minutes    Moist Heat Location  Lumbar Spine      Electrical Stimulation   Electrical Stimulation Location  right lumbar area    Electrical  Stimulation Action  IFC    Electrical Stimulation Parameters  sitting    Electrical Stimulation Goals  Pain      Manual Therapy   Manual Therapy  Soft tissue mobilization    Soft tissue mobilization  with her in sitting to the right lumbar and buttock area, she has increased tension and tenderness here today               PT Short Term Goals - 05/28/18 1422      PT SHORT TERM GOAL #1   Title  independent with HEP    Status  Achieved        PT Long Term Goals - 07/08/18 1512      PT LONG TERM GOAL #1   Title  decrease pain 50%    Status  Partially Met      PT LONG TERM GOAL #2   Status  Partially Met      PT LONG TERM GOAL #3   Title  Understand proper posture and body mechanics for housework and yardwork tasks.     Status  Achieved            Plan - 07/13/18 1518    Clinical Impression Statement  Patient is continuing to have stomach issues and not wanting to do a lot of exercises.  She is tender and tight in the right lumbar area, has a knot in this area just above the ilium    PT Next Visit Plan  continue to push strength and function as she tolerates, she called her MD today    Consulted and Agree with Plan of Care  Patient       Patient will benefit from skilled therapeutic intervention in order to improve the following deficits and impairments:  Decreased activity tolerance, Decreased mobility, Decreased endurance, Cardiopulmonary status limiting activity, Decreased range of motion, Decreased strength, Postural dysfunction, Improper body mechanics, Impaired flexibility, Pain, Increased muscle spasms, Difficulty walking  Visit Diagnosis: Acute right-sided low back pain with right-sided sciatica  Muscle spasm of back  Difficulty in walking, not elsewhere classified     Problem List Patient Active Problem List   Diagnosis Date Noted  . Diarrhea 07/03/2018  . Dyspepsia 07/03/2018  . S/P lumbar spinal fusion 07/31/2017  . Right low back pain  03/04/2017  .  Arthropod bite 01/28/2017  . Atrial flutter ([facility]) 03/06/2016  . Obesity 11/06/2015  . Urge incontinence 01/24/2015  . Estrogen deficiency 10/25/2014  . Lichen planus 12/20/2013  . Encounter for Medicare annual wellness exam 06/17/2013  . Screening mammogram, encounter for 06/15/2012  . Hyperglycemia 06/03/2011  . HYPERTENSION, BENIGN ESSENTIAL 10/23/2010  . CONSTIPATION 10/23/2010  . HEMATOCHEZIA 10/23/2010  . OSTEOARTHRITIS, HANDS, BILATERAL 01/24/2010  . Hypothyroidism 08/24/2008  . Vitamin D deficiency 06/06/2008  . Hyperlipidemia 06/06/2008  . MENOPAUSAL SYNDROME 03/29/2008  . Osteopenia 03/29/2008  . ALLERGIC RHINITIS 01/25/2008  . IBS 01/25/2008  . OSTEOARTHRITIS 01/25/2008  . FIBROMYALGIA 01/25/2008    Jearld Lesch., PT 07/13/2018, 3:20 PM  [facility] [place] [address] [place], [place], [address] Phone: [phone]   Fax:  [phone]  Name: FREDDY CRUELL MRN: [id number] Date of Birth: 1932-10-07

## 2018-07-15 ENCOUNTER — Ambulatory Visit: Payer: Medicare Other | Admitting: Physical Therapy

## 2018-07-15 ENCOUNTER — Encounter: Payer: Self-pay | Admitting: Physical Therapy

## 2018-07-15 DIAGNOSIS — R262 Difficulty in walking, not elsewhere classified: Secondary | ICD-10-CM | POA: Diagnosis not present

## 2018-07-15 DIAGNOSIS — M5441 Lumbago with sciatica, right side: Secondary | ICD-10-CM | POA: Diagnosis not present

## 2018-07-15 DIAGNOSIS — M62830 Muscle spasm of back: Secondary | ICD-10-CM

## 2018-07-15 NOTE — Therapy (Signed)
Dyspepsia 07/03/2018   . S/P lumbar spinal fusion 07/31/2017  . Right low back pain 03/04/2017  . Arthropod bite 01/28/2017  . Atrial flutter ([place]) 03/06/2016  . Obesity 11/06/2015  . Urge incontinence 01/24/2015  . Estrogen deficiency 10/25/2014  . Lichen planus 96/72/8979  . Encounter for Medicare annual wellness exam 06/17/2013  . Screening mammogram, encounter for 06/15/2012  . Hyperglycemia 06/03/2011  . HYPERTENSION, BENIGN ESSENTIAL 10/23/2010  . CONSTIPATION 10/23/2010  . HEMATOCHEZIA 10/23/2010  . OSTEOARTHRITIS, HANDS, BILATERAL 01/24/2010  . Hypothyroidism 08/24/2008  . Vitamin D deficiency 06/06/2008  . Hyperlipidemia 06/06/2008  . MENOPAUSAL SYNDROME 03/29/2008  . Osteopenia 03/29/2008  . ALLERGIC RHINITIS 01/25/2008  . IBS 01/25/2008  . OSTEOARTHRITIS 01/25/2008  . FIBROMYALGIA 01/25/2008    [name], PT 07/15/2018, 3:18 PM  [place] [address] [place], [place], [address] Phone: [phone]   Fax:  [phone]  Name: Linda Mcgrath MRN: [id number] Date of Birth: [birth year]  [place], [place], [address] Phone: [phone]   Fax:  [phone]  Physical Therapy Treatment  Patient Details  Name: Linda Mcgrath MRN: [id number] Date of Birth: 05/11/1933 Referring Provider (PT): [name]   Encounter Date: 07/15/2018  PT End of Session - 07/15/18 1515    Visit Number  15    Date for PT Re-Evaluation  07/20/18    PT Start Time  1440    PT Stop Time  1530    PT Time Calculation (min)  50 min    Activity Tolerance  Patient tolerated treatment well    Behavior During Therapy  [facility] for tasks assessed/performed       Past Medical History:  Diagnosis Date  . Allergic rhinitis   . Asthma   . Cataract    Bil/lens implant  . Colon polyp 1999   small polyp  . Diverticulosis   . Dysrhythmia   . Fibromyalgia   . Hyperlipidemia   . Hypothyroidism   . Internal hemorrhoids   . Leg cramps   . Lung nodule    stable LUL 9 mm  . Menopausal syndrome   . Osteoarthritis   . UTI (urinary tract infection)     Past Surgical History:  Procedure Laterality Date  . Abd U/S  11/1998   negative  . Abd U/S  01/2001   gallbladder polyps  . ABDOMINAL HYSTERECTOMY  1975   total-fibroids  . APPENDECTOMY  1975  . BREAST BIOPSY     benign  . BREAST EXCISIONAL BIOPSY Left 1957  . CARDIOVERSION N/A 03/07/2016   Procedure: CARDIOVERSION;  Surgeon: Adrian Prows, MD;  Location: [facility];  Service: Cardiovascular;  Laterality: N/A;  . CATARACT EXTRACTION     bilateral  . CHOLECYSTECTOMY    . COLONOSCOPY  1998   Diverticulosis; polyp\  . COLONOSCOPY  09/2002   Diverticulosis, hem  . COLONOSCOPY  12/2007   diverticulosis, polyp  . DEXA  10/2001   osteopenia  . ELECTROPHYSIOLOGIC STUDY N/A 04/03/2016   Procedure: A-Flutter Ablation;  Surgeon: Will Meredith Leeds, MD;  Location: [facility];  Service: Cardiovascular;  Laterality: N/A;  . ESOPHAGOGASTRODUODENOSCOPY  2004  . [place]  scan  01/2001   Negative  . KNEE SURGERY     right knee / 11/2004  . LAMINECTOMY WITH POSTERIOR LATERAL ARTHRODESIS LEVEL 2 N/A 07/31/2017   Procedure: DECOMPRESSIVE LAMINECTOMY LUMABR FOUR-FIVE, LUMBAR FIVE-SACRAL ONE;  Surgeon: Eustace Moore, MD;  Location: [facility];  Service: Neurosurgery;  Laterality: N/A;  . laser surgery for glaucoma Left Sep 21, 2014  . ROTATOR CUFF REPAIR     x 2 /right shoulder  . SKIN CANCER EXCISION     pre-melanoma / on face  . TEE WITHOUT CARDIOVERSION N/A 03/07/2016   Procedure: TRANSESOPHAGEAL ECHOCARDIOGRAM (TEE);  Surgeon: Adrian Prows, MD;  Location: [facility];  Service: Cardiovascular;  Laterality: N/A;  . TOE SURGERY     rt foot     There were no vitals filed for this visit.  Subjective Assessment - 07/15/18 1513    Subjective  Patietn reports that she is stqarting to feel a little better with her stomach issues    Currently in Pain?  Yes    Pain Score  3     Pain Location  Back    Pain Orientation  Right;Lower  Dyspepsia 07/03/2018   . S/P lumbar spinal fusion 07/31/2017  . Right low back pain 03/04/2017  . Arthropod bite 01/28/2017  . Atrial flutter ([place]) 03/06/2016  . Obesity 11/06/2015  . Urge incontinence 01/24/2015  . Estrogen deficiency 10/25/2014  . Lichen planus 96/72/8979  . Encounter for Medicare annual wellness exam 06/17/2013  . Screening mammogram, encounter for 06/15/2012  . Hyperglycemia 06/03/2011  . HYPERTENSION, BENIGN ESSENTIAL 10/23/2010  . CONSTIPATION 10/23/2010  . HEMATOCHEZIA 10/23/2010  . OSTEOARTHRITIS, HANDS, BILATERAL 01/24/2010  . Hypothyroidism 08/24/2008  . Vitamin D deficiency 06/06/2008  . Hyperlipidemia 06/06/2008  . MENOPAUSAL SYNDROME 03/29/2008  . Osteopenia 03/29/2008  . ALLERGIC RHINITIS 01/25/2008  . IBS 01/25/2008  . OSTEOARTHRITIS 01/25/2008  . FIBROMYALGIA 01/25/2008    [name], PT 07/15/2018, 3:18 PM  [place] [address] [address] [place], [place], [address] Phone: [phone]   Fax:  [phone]  Name: Linda Mcgrath MRN: [id number] Date of Birth: [birth year]

## 2018-07-20 ENCOUNTER — Encounter: Payer: Self-pay | Admitting: Physical Therapy

## 2018-07-20 ENCOUNTER — Ambulatory Visit: Payer: Medicare Other | Admitting: Physical Therapy

## 2018-07-20 DIAGNOSIS — M5441 Lumbago with sciatica, right side: Secondary | ICD-10-CM | POA: Diagnosis not present

## 2018-07-20 DIAGNOSIS — R262 Difficulty in walking, not elsewhere classified: Secondary | ICD-10-CM | POA: Diagnosis not present

## 2018-07-20 DIAGNOSIS — M62830 Muscle spasm of back: Secondary | ICD-10-CM | POA: Diagnosis not present

## 2018-07-20 NOTE — Therapy (Signed)
[facility] [place], [place], [address] Phone: [phone]   Fax:  [phone]  Physical Therapy Treatment  Patient Details  Name: Linda Mcgrath MRN: [id number] Date of Birth: 1932-12-06 Referring Provider (PT): [name]   Encounter Date: 07/20/2018  PT End of Session - 07/20/18 1521    Visit Number  16    Date for PT Re-Evaluation  08/20/18    PT Start Time  1435    PT Stop Time  1530    PT Time Calculation (min)  55 min    Activity Tolerance  Patient tolerated treatment well    Behavior During Therapy  [facility] for tasks assessed/performed       Past Medical History:  Diagnosis Date  . Allergic rhinitis   . Asthma   . Cataract    Bil/lens implant  . Colon polyp 1999   small polyp  . Diverticulosis   . Dysrhythmia   . Fibromyalgia   . Hyperlipidemia   . Hypothyroidism   . Internal hemorrhoids   . Leg cramps   . Lung nodule    stable LUL 9 mm  . Menopausal syndrome   . Osteoarthritis   . UTI (urinary tract infection)     Past Surgical History:  Procedure Laterality Date  . Abd U/S  11/1998   negative  . Abd U/S  01/2001   gallbladder polyps  . ABDOMINAL HYSTERECTOMY  1975   total-fibroids  . APPENDECTOMY  1975  . BREAST BIOPSY     benign  . BREAST EXCISIONAL BIOPSY Left 1957  . CARDIOVERSION N/A 03/07/2016   Procedure: CARDIOVERSION;  Surgeon: Adrian Prows, MD;  Location: [place];  Service: Cardiovascular;  Laterality: N/A;  . CATARACT EXTRACTION     bilateral  . CHOLECYSTECTOMY    . COLONOSCOPY  1998   Diverticulosis; polyp\  . COLONOSCOPY  09/2002   Diverticulosis, hem  . COLONOSCOPY  12/2007   diverticulosis, polyp  . DEXA  10/2001   osteopenia  . ELECTROPHYSIOLOGIC STUDY N/A 04/03/2016   Procedure: A-Flutter Ablation;  Surgeon: Will Meredith Leeds, MD;  Location: [place] [facility];  Service: Cardiovascular;  Laterality: N/A;  . ESOPHAGOGASTRODUODENOSCOPY  2004  . [place]  scan  01/2001   Negative  . KNEE SURGERY     right knee / 11/2004  . LAMINECTOMY WITH POSTERIOR LATERAL ARTHRODESIS LEVEL 2 N/A 07/31/2017   Procedure: DECOMPRESSIVE LAMINECTOMY LUMABR FOUR-FIVE, LUMBAR FIVE-SACRAL ONE;  Surgeon: Eustace Moore, MD;  Location: [facility];  Service: Neurosurgery;  Laterality: N/A;  . laser surgery for glaucoma Left Sep 21, 2014  . ROTATOR CUFF REPAIR     x 2 /right shoulder  . SKIN CANCER EXCISION     pre-melanoma / on face  . TEE WITHOUT CARDIOVERSION N/A 03/07/2016   Procedure: TRANSESOPHAGEAL ECHOCARDIOGRAM (TEE);  Surgeon: Adrian Prows, MD;  Location: [place] [facility];  Service: Cardiovascular;  Laterality: N/A;  . TOE SURGERY     rt foot     There were no vitals filed for this visit.  Subjective Assessment - 07/20/18 1440    Subjective  Patient reports that she has been doing more walking without her cane, walks in here today without the cane    Currently in Pain?  Yes    Pain Score  3     Pain Location  Back    Pain Orientation  Right;Lower    Pain Descriptors / Indicators  Sore  of motion, Decreased strength, Postural dysfunction, Improper body mechanics, Impaired flexibility, Pain, Increased muscle spasms, Difficulty walking  Visit Diagnosis: Acute  right-sided low back pain with right-sided sciatica  Muscle spasm of back  Difficulty in walking, not elsewhere classified     Problem List Patient Active Problem List   Diagnosis Date Noted  . Diarrhea 07/03/2018  . Dyspepsia 07/03/2018  . S/P lumbar spinal fusion 07/31/2017  . Right low back pain 03/04/2017  . Arthropod bite 01/28/2017  . Atrial flutter ([place]) 03/06/2016  . Obesity 11/06/2015  . Urge incontinence 01/24/2015  . Estrogen deficiency 10/25/2014  . Lichen planus 28/00/3491  . Encounter for Medicare annual wellness exam 06/17/2013  . Screening mammogram, encounter for 06/15/2012  . Hyperglycemia 06/03/2011  . HYPERTENSION, BENIGN ESSENTIAL 10/23/2010  . CONSTIPATION 10/23/2010  . HEMATOCHEZIA 10/23/2010  . OSTEOARTHRITIS, HANDS, BILATERAL 01/24/2010  . Hypothyroidism 08/24/2008  . Vitamin D deficiency 06/06/2008  . Hyperlipidemia 06/06/2008  . MENOPAUSAL SYNDROME 03/29/2008  . Osteopenia 03/29/2008  . ALLERGIC RHINITIS 01/25/2008  . IBS 01/25/2008  . OSTEOARTHRITIS 01/25/2008  . FIBROMYALGIA 01/25/2008    [name], PT 07/20/2018, 3:24 PM  [facility] [address] [place], [place], [address] Phone: [phone]   Fax:  [phone]  Name: Linda Mcgrath MRN: [id number] Date of Birth: [birth year]  of motion, Decreased strength, Postural dysfunction, Improper body mechanics, Impaired flexibility, Pain, Increased muscle spasms, Difficulty walking  Visit Diagnosis: Acute  right-sided low back pain with right-sided sciatica  Muscle spasm of back  Difficulty in walking, not elsewhere classified     Problem List Patient Active Problem List   Diagnosis Date Noted  . Diarrhea 07/03/2018  . Dyspepsia 07/03/2018  . S/P lumbar spinal fusion 07/31/2017  . Right low back pain 03/04/2017  . Arthropod bite 01/28/2017  . Atrial flutter ([place]) 03/06/2016  . Obesity 11/06/2015  . Urge incontinence 01/24/2015  . Estrogen deficiency 10/25/2014  . Lichen planus 28/00/3491  . Encounter for Medicare annual wellness exam 06/17/2013  . Screening mammogram, encounter for 06/15/2012  . Hyperglycemia 06/03/2011  . HYPERTENSION, BENIGN ESSENTIAL 10/23/2010  . CONSTIPATION 10/23/2010  . HEMATOCHEZIA 10/23/2010  . OSTEOARTHRITIS, HANDS, BILATERAL 01/24/2010  . Hypothyroidism 08/24/2008  . Vitamin D deficiency 06/06/2008  . Hyperlipidemia 06/06/2008  . MENOPAUSAL SYNDROME 03/29/2008  . Osteopenia 03/29/2008  . ALLERGIC RHINITIS 01/25/2008  . IBS 01/25/2008  . OSTEOARTHRITIS 01/25/2008  . FIBROMYALGIA 01/25/2008    [name], PT 07/20/2018, 3:24 PM  [facility] [address] [place], [place], [address] Phone: [phone]   Fax:  [phone]  Name: Linda Mcgrath MRN: [id number] Date of Birth: [birth year]

## 2018-07-20 NOTE — Addendum Note (Signed)
Addended by: Sumner Boast on: 07/20/2018 03:27 PM   Modules accepted: Orders

## 2018-07-27 ENCOUNTER — Encounter: Payer: Self-pay | Admitting: Physical Therapy

## 2018-07-27 ENCOUNTER — Ambulatory Visit: Payer: Medicare Other | Attending: Family Medicine | Admitting: Physical Therapy

## 2018-07-27 DIAGNOSIS — M5441 Lumbago with sciatica, right side: Secondary | ICD-10-CM

## 2018-07-27 DIAGNOSIS — R262 Difficulty in walking, not elsewhere classified: Secondary | ICD-10-CM | POA: Diagnosis not present

## 2018-07-27 DIAGNOSIS — M62830 Muscle spasm of back: Secondary | ICD-10-CM | POA: Insufficient documentation

## 2018-07-27 NOTE — Therapy (Signed)
07/03/2018  . Dyspepsia 07/03/2018  . S/P lumbar spinal fusion 07/31/2017  . Right low back pain 03/04/2017  . Arthropod bite 01/28/2017  . Atrial flutter ([place]) 03/06/2016  . Obesity 11/06/2015  . Urge incontinence 01/24/2015  . Estrogen deficiency 10/25/2014  . Lichen planus 83/81/8403  . Encounter for Medicare annual wellness exam 06/17/2013  . Screening mammogram, encounter for 06/15/2012  . Hyperglycemia 06/03/2011  . HYPERTENSION, BENIGN ESSENTIAL 10/23/2010  . CONSTIPATION 10/23/2010  . HEMATOCHEZIA 10/23/2010  . OSTEOARTHRITIS, HANDS, BILATERAL 01/24/2010  . Hypothyroidism 08/24/2008  . Vitamin D deficiency 06/06/2008  . Hyperlipidemia 06/06/2008  . MENOPAUSAL SYNDROME 03/29/2008  . Osteopenia 03/29/2008  . ALLERGIC RHINITIS 01/25/2008  . IBS 01/25/2008  . OSTEOARTHRITIS 01/25/2008  . FIBROMYALGIA 01/25/2008    [name], PT  07/27/2018, 3:55 PM  [address] [place], [place], [address] Phone: [phone]   Fax:  [phone]  Name: Linda Mcgrath MRN: [id number] Date of Birth: [birth year]  [facility] [address] [place], [place], [address] Phone: [phone]   Fax:  [phone]  Physical Therapy Treatment  Patient Details  Name: Linda Mcgrath MRN: [id number] Date of Birth: 11/27/1932 Referring Provider (PT): [name]   Encounter Date: 07/27/2018  PT End of Session - 07/27/18 1552    Visit Number  17    Date for PT Re-Evaluation  08/20/18    PT Start Time  1514    PT Stop Time  1613    PT Time Calculation (min)  59 min    Activity Tolerance  Patient tolerated treatment well    Behavior During Therapy  [facility] for tasks assessed/performed       Past Medical History:  Diagnosis Date  . Allergic rhinitis   . Asthma   . Cataract    Bil/lens implant  . Colon polyp 1999   small polyp  . Diverticulosis   . Dysrhythmia   . Fibromyalgia   . Hyperlipidemia   . Hypothyroidism   . Internal hemorrhoids   . Leg cramps   . Lung nodule    stable LUL 9 mm  . Menopausal syndrome   . Osteoarthritis   . UTI (urinary tract infection)     Past Surgical History:  Procedure Laterality Date  . Abd U/S  11/1998   negative  . Abd U/S  01/2001   gallbladder polyps  . ABDOMINAL HYSTERECTOMY  1975   total-fibroids  . APPENDECTOMY  1975  . BREAST BIOPSY     benign  . BREAST EXCISIONAL BIOPSY Left 1957  . CARDIOVERSION N/A 03/07/2016   Procedure: CARDIOVERSION;  Surgeon: Jay Ganji, MD;  Location: [facility];  Service: Cardiovascular;  Laterality: N/A;  . CATARACT EXTRACTION     bilateral  . CHOLECYSTECTOMY    . COLONOSCOPY  1998   Diverticulosis; polyp\  . COLONOSCOPY  09/2002   Diverticulosis, hem  . COLONOSCOPY  12/2007   diverticulosis, polyp  . DEXA  10/2001   osteopenia  . ELECTROPHYSIOLOGIC STUDY N/A 04/03/2016   Procedure: A-Flutter Ablation;  Surgeon: Will Martin Camnitz, MD;  [facility];  Service: Cardiovascular;  Laterality: N/A;  . ESOPHAGOGASTRODUODENOSCOPY  2004  . [place]  scan  01/2001   Negative  . KNEE SURGERY     right knee / 11/2004  . LAMINECTOMY WITH POSTERIOR LATERAL ARTHRODESIS LEVEL 2 N/A 07/31/2017   Procedure: DECOMPRESSIVE LAMINECTOMY LUMABR FOUR-FIVE, LUMBAR FIVE-SACRAL ONE;  Surgeon: Jones, David S, MD;  Location: [facility];  Service: Neurosurgery;  Laterality: N/A;  . laser surgery for glaucoma Left Sep 21, 2014  . ROTATOR CUFF REPAIR     x 2 /right shoulder  . SKIN CANCER EXCISION     pre-melanoma / on face  . TEE WITHOUT CARDIOVERSION N/A 03/07/2016   Procedure: TRANSESOPHAGEAL ECHOCARDIOGRAM (TEE);  Surgeon: Jay Ganji, MD;  [facility];  Service: Cardiovascular;  Laterality: N/A;  . TOE SURGERY     rt foot     There were no vitals filed for this visit.  Subjective Assessment - 07/27/18 1520    Subjective  I am getting better, less stooped over and I can walk more    Currently in Pain?  Yes    Pain Score  2     Pain Location  Back    Pain Orientation  Lower    Pain Relieving Factors  "I think what we are doing is helping"                         [place], [place], [address] Phone: [phone]   Fax:  [phone]  Physical Therapy Treatment  Patient Details  Name: Linda Mcgrath MRN: [id number] Date of Birth: Jul 20, 1933 Referring Provider (PT): [name]   Encounter Date: 07/27/2018  PT End of Session - 07/27/18 1552    Visit Number  17    Date for PT Re-Evaluation  08/20/18    PT Start Time  1514    PT Stop Time  1613    PT Time Calculation (min)  59 min    Activity Tolerance  Patient tolerated treatment well    Behavior During Therapy  [facility] for tasks assessed/performed       Past Medical History:  Diagnosis Date  . Allergic rhinitis   . Asthma   . Cataract    Bil/lens implant  . Colon polyp 1999   small polyp  . Diverticulosis   . Dysrhythmia   . Fibromyalgia   . Hyperlipidemia   . Hypothyroidism   . Internal hemorrhoids   . Leg cramps   . Lung nodule    stable LUL 9 mm  . Menopausal syndrome   . Osteoarthritis   . UTI (urinary tract infection)     Past Surgical History:  Procedure Laterality Date  . Abd U/S  11/1998   negative  . Abd U/S  01/2001   gallbladder polyps  . ABDOMINAL HYSTERECTOMY  1975   total-fibroids  . APPENDECTOMY  1975  . BREAST BIOPSY     benign  . BREAST EXCISIONAL BIOPSY Left 1957  . CARDIOVERSION N/A 03/07/2016   Procedure: CARDIOVERSION;  Surgeon: Adrian Prows, MD;  Location: [facility];  Service: Cardiovascular;  Laterality: N/A;  . CATARACT EXTRACTION     bilateral  . CHOLECYSTECTOMY    . COLONOSCOPY  1998   Diverticulosis; polyp_0  . COLONOSCOPY  09/2002   Diverticulosis, hem  . COLONOSCOPY  12/2007   diverticulosis, polyp  . DEXA  10/2001   osteopenia  . ELECTROPHYSIOLOGIC STUDY N/A 04/03/2016   Procedure: A-Flutter Ablation;  Surgeon: Will Meredith Leeds, MD;  Location: [place] [facility];  Service: Cardiovascular;  Laterality: N/A;  . ESOPHAGOGASTRODUODENOSCOPY  2004  . [place]  scan  01/2001   Negative  . KNEE SURGERY     right knee / 11/2004  . LAMINECTOMY WITH POSTERIOR LATERAL ARTHRODESIS LEVEL 2 N/A 07/31/2017   Procedure: DECOMPRESSIVE LAMINECTOMY LUMABR FOUR-FIVE, LUMBAR FIVE-SACRAL ONE;  Surgeon: Eustace Moore, MD;  Location: [facility];  Service: Neurosurgery;  Laterality: N/A;  . laser surgery for glaucoma Left Sep 21, 2014  . ROTATOR CUFF REPAIR     x 2 /right shoulder  . SKIN CANCER EXCISION     pre-melanoma / on face  . TEE WITHOUT CARDIOVERSION N/A 03/07/2016   Procedure: TRANSESOPHAGEAL ECHOCARDIOGRAM (TEE);  Surgeon: Adrian Prows, MD;  Location: [facility];  Service: Cardiovascular;  Laterality: N/A;  . TOE SURGERY     rt foot     There were no vitals filed for this visit.  Subjective Assessment - 07/27/18 1520    Subjective  I am getting better, less stooped over and I can walk more    Currently in Pain?  Yes    Pain Score  2     Pain Location  Back    Pain Orientation  Lower    Pain Relieving Factors  "I think what we are doing is helping"

## 2018-07-30 ENCOUNTER — Ambulatory Visit: Payer: Medicare Other | Admitting: Physical Therapy

## 2018-07-30 ENCOUNTER — Encounter: Payer: Self-pay | Admitting: Physical Therapy

## 2018-07-30 DIAGNOSIS — M5441 Lumbago with sciatica, right side: Secondary | ICD-10-CM | POA: Diagnosis not present

## 2018-07-30 DIAGNOSIS — M62830 Muscle spasm of back: Secondary | ICD-10-CM

## 2018-07-30 DIAGNOSIS — R262 Difficulty in walking, not elsewhere classified: Secondary | ICD-10-CM

## 2018-07-30 NOTE — Therapy (Signed)
Linda Mcgrath was in a massaging chair for an hour and got up really sore and stiff.  She has some increased tightness and knots in the right low back today, she is using her cane today and a little more forward flexed trunk    PT Next Visit Plan  continue to  work on function    Consulted and Agree with Plan of Care  Patient       Patient will benefit from skilled therapeutic intervention in order to improve the following deficits and impairments:  Decreased activity tolerance, Decreased mobility, Decreased endurance, Cardiopulmonary status limiting activity, Decreased range of motion, Decreased strength, Postural dysfunction, Improper body mechanics, Impaired flexibility, Pain, Increased muscle spasms, Difficulty walking  Visit Diagnosis: Acute right-sided low back pain with right-sided sciatica  Muscle spasm of back  Difficulty in walking, not elsewhere classified     Problem List Patient Active Problem List   Diagnosis Date Noted  . Diarrhea 07/03/2018  . Dyspepsia 07/03/2018  . S/P lumbar spinal fusion 07/31/2017  . Right low back pain 03/04/2017  . Arthropod bite 01/28/2017  . Atrial flutter ([place]) 03/06/2016  . Obesity 11/06/2015  . Urge incontinence 01/24/2015  . Estrogen deficiency 10/25/2014  . Lichen planus 19/50/9326  . Encounter for Medicare annual wellness exam 06/17/2013  . Screening mammogram, encounter for 06/15/2012  . Hyperglycemia 06/03/2011  . HYPERTENSION, BENIGN ESSENTIAL 10/23/2010  . CONSTIPATION 10/23/2010  . HEMATOCHEZIA 10/23/2010  . OSTEOARTHRITIS, HANDS, BILATERAL 01/24/2010  . Hypothyroidism 08/24/2008  . Vitamin D deficiency 06/06/2008  . Hyperlipidemia 06/06/2008  . MENOPAUSAL SYNDROME 03/29/2008  . Osteopenia 03/29/2008  . ALLERGIC RHINITIS 01/25/2008  . IBS 01/25/2008  . OSTEOARTHRITIS 01/25/2008  . FIBROMYALGIA 01/25/2008    [name], PT 07/30/2018, 2:24 PM  [place], [place], [address] Phone: [phone]   Fax:  [phone]  Name: KENSI KARR MRN: [id number] Date of Birth: [birth year]  Linda Mcgrath was in a massaging chair for an hour and got up really sore and stiff.  She has some increased tightness and knots in the right low back today, she is using her cane today and a little more forward flexed trunk    PT Next Visit Plan  continue to  work on function    Consulted and Agree with Plan of Care  Patient       Patient will benefit from skilled therapeutic intervention in order to improve the following deficits and impairments:  Decreased activity tolerance, Decreased mobility, Decreased endurance, Cardiopulmonary status limiting activity, Decreased range of motion, Decreased strength, Postural dysfunction, Improper body mechanics, Impaired flexibility, Pain, Increased muscle spasms, Difficulty walking  Visit Diagnosis: Acute right-sided low back pain with right-sided sciatica  Muscle spasm of back  Difficulty in walking, not elsewhere classified     Problem List Patient Active Problem List   Diagnosis Date Noted  . Diarrhea 07/03/2018  . Dyspepsia 07/03/2018  . S/P lumbar spinal fusion 07/31/2017  . Right low back pain 03/04/2017  . Arthropod bite 01/28/2017  . Atrial flutter ([place]) 03/06/2016  . Obesity 11/06/2015  . Urge incontinence 01/24/2015  . Estrogen deficiency 10/25/2014  . Lichen planus 19/50/9326  . Encounter for Medicare annual wellness exam 06/17/2013  . Screening mammogram, encounter for 06/15/2012  . Hyperglycemia 06/03/2011  . HYPERTENSION, BENIGN ESSENTIAL 10/23/2010  . CONSTIPATION 10/23/2010  . HEMATOCHEZIA 10/23/2010  . OSTEOARTHRITIS, HANDS, BILATERAL 01/24/2010  . Hypothyroidism 08/24/2008  . Vitamin D deficiency 06/06/2008  . Hyperlipidemia 06/06/2008  . MENOPAUSAL SYNDROME 03/29/2008  . Osteopenia 03/29/2008  . ALLERGIC RHINITIS 01/25/2008  . IBS 01/25/2008  . OSTEOARTHRITIS 01/25/2008  . FIBROMYALGIA 01/25/2008    [name], PT 07/30/2018, 2:24 PM  [place], [place], [address] Phone: [phone]   Fax:  [phone]  Name: KENSI KARR MRN: [id number] Date of Birth: [birth year]  Linda Mcgrath was in a massaging chair for an hour and got up really sore and stiff.  She has some increased tightness and knots in the right low back today, she is using her cane today and a little more forward flexed trunk    PT Next Visit Plan  continue to  work on function    Consulted and Agree with Plan of Care  Patient       Patient will benefit from skilled therapeutic intervention in order to improve the following deficits and impairments:  Decreased activity tolerance, Decreased mobility, Decreased endurance, Cardiopulmonary status limiting activity, Decreased range of motion, Decreased strength, Postural dysfunction, Improper body mechanics, Impaired flexibility, Pain, Increased muscle spasms, Difficulty walking  Visit Diagnosis: Acute right-sided low back pain with right-sided sciatica  Muscle spasm of back  Difficulty in walking, not elsewhere classified     Problem List Patient Active Problem List   Diagnosis Date Noted  . Diarrhea 07/03/2018  . Dyspepsia 07/03/2018  . S/P lumbar spinal fusion 07/31/2017  . Right low back pain 03/04/2017  . Arthropod bite 01/28/2017  . Atrial flutter ([place]) 03/06/2016  . Obesity 11/06/2015  . Urge incontinence 01/24/2015  . Estrogen deficiency 10/25/2014  . Lichen planus 19/50/9326  . Encounter for Medicare annual wellness exam 06/17/2013  . Screening mammogram, encounter for 06/15/2012  . Hyperglycemia 06/03/2011  . HYPERTENSION, BENIGN ESSENTIAL 10/23/2010  . CONSTIPATION 10/23/2010  . HEMATOCHEZIA 10/23/2010  . OSTEOARTHRITIS, HANDS, BILATERAL 01/24/2010  . Hypothyroidism 08/24/2008  . Vitamin D deficiency 06/06/2008  . Hyperlipidemia 06/06/2008  . MENOPAUSAL SYNDROME 03/29/2008  . Osteopenia 03/29/2008  . ALLERGIC RHINITIS 01/25/2008  . IBS 01/25/2008  . OSTEOARTHRITIS 01/25/2008  . FIBROMYALGIA 01/25/2008    [name], PT 07/30/2018, 2:24 PM  [place], [place], [address] Phone: [phone]   Fax:  [phone]  Name: KENSI KARR MRN: [id number] Date of Birth: [birth year]

## 2018-08-03 ENCOUNTER — Encounter: Payer: Self-pay | Admitting: Physical Therapy

## 2018-08-03 ENCOUNTER — Ambulatory Visit: Payer: Medicare Other | Admitting: Physical Therapy

## 2018-08-03 DIAGNOSIS — M5441 Lumbago with sciatica, right side: Secondary | ICD-10-CM

## 2018-08-03 DIAGNOSIS — R262 Difficulty in walking, not elsewhere classified: Secondary | ICD-10-CM | POA: Diagnosis not present

## 2018-08-03 DIAGNOSIS — M62830 Muscle spasm of back: Secondary | ICD-10-CM | POA: Diagnosis not present

## 2018-08-03 NOTE — Therapy (Signed)
[facility] [address] [place], [place], [address] Phone: [phone]   Fax:  [phone]  Physical Therapy Treatment  Patient Details  Name: Linda Mcgrath MRN: [id number] Date of Birth: [birth year] Referring Provider (PT): [name]   Encounter Date: 08/03/2018  PT End of Session - 08/03/18 1127    Visit Number  19    PT Start Time  1030    PT Stop Time  1130    PT Time Calculation (min)  60 min    Activity Tolerance  Patient tolerated treatment well    Behavior During Therapy  [facility] for tasks assessed/performed       Past Medical History:  Diagnosis Date  . Allergic rhinitis   . Asthma   . Cataract    Bil/lens implant  . Colon polyp 1999   small polyp  . Diverticulosis   . Dysrhythmia   . Fibromyalgia   . Hyperlipidemia   . Hypothyroidism   . Internal hemorrhoids   . Leg cramps   . Lung nodule    stable LUL 9 mm  . Menopausal syndrome   . Osteoarthritis   . UTI (urinary tract infection)     Past Surgical History:  Procedure Laterality Date  . Abd U/S  11/1998   negative  . Abd U/S  01/2001   gallbladder polyps  . ABDOMINAL HYSTERECTOMY  1975   total-fibroids  . APPENDECTOMY  1975  . BREAST BIOPSY     benign  . BREAST EXCISIONAL BIOPSY Left 1957  . CARDIOVERSION N/A 03/07/2016   Procedure: CARDIOVERSION;  Surgeon: Yates Decamp, MD;  Location: [facility] ENDOSCOPY;  Service: Cardiovascular;  Laterality: N/A;  . CATARACT EXTRACTION     bilateral  . CHOLECYSTECTOMY    . COLONOSCOPY  1998   Diverticulosis; polyp\  . COLONOSCOPY  09/2002   Diverticulosis, hem  . COLONOSCOPY  12/2007   diverticulosis, polyp  . DEXA  10/2001   osteopenia  . ELECTROPHYSIOLOGIC STUDY N/A 04/03/2016   Procedure: A-Flutter Ablation;  Surgeon: Will [name], MD;  [facility] INVASIVE CV LAB;  Service: Cardiovascular;  Laterality: N/A;  . ESOPHAGOGASTRODUODENOSCOPY  2004  . [place]ida scan  01/2001   Negative  . KNEE  SURGERY     right knee / 11/2004  . LAMINECTOMY WITH POSTERIOR LATERAL ARTHRODESIS LEVEL 2 N/A 07/31/2017   Procedure: DECOMPRESSIVE LAMINECTOMY LUMABR FOUR-FIVE, LUMBAR FIVE-SACRAL ONE;  Surgeon: Tia Alert, MD;  Location: [facility] OR;  Service: Neurosurgery;  Laterality: N/A;  . laser surgery for glaucoma Left Sep 21, 2014  . ROTATOR CUFF REPAIR     x 2 /right shoulder  . SKIN CANCER EXCISION     pre-melanoma / on face  . TEE WITHOUT CARDIOVERSION N/A 03/07/2016   Procedure: TRANSESOPHAGEAL ECHOCARDIOGRAM (TEE);  Surgeon: Yates Decamp, MD;  Location: [facility] ENDOSCOPY;  Service: Cardiovascular;  Laterality: N/A;  . TOE SURGERY     rt foot     There were no vitals filed for this visit.                    [facility] Adult PT Treatment/Exercise - 08/03/18 0001      Ambulation/Gait   Gait Comments  SPC gait outside 300 feet some cues for posture as she wanted to flex trunk      Exercises   Exercises  Lumbar      Lumbar Exercises: Aerobic   Nustep  level 4 x 6 minutes  Lumbar Exercises: Machines for Strengthening   Other Lumbar Machine Exercise  seated row 10# 2x10      Lumbar Exercises: Standing   Scapular Retraction  20 reps;Theraband    Theraband Level (Scapular Retraction)  Level 3 (Green)    Row  20 reps;Theraband    Theraband Level (Row)  Level 3 (Green)    Other Standing Lumbar Exercises  marches, hip abduction and extension 3# 2x10      Lumbar Exercises: Seated   Long Arc Quad on Chair  Both;2 sets;10 reps;Weights    LAQ on Chair Weights (lbs)  3    Other Seated Lumbar Exercises  physioball in lap isometric abs    Other Seated Lumbar Exercises  green tband HS curls      Moist Heat Therapy   Number Minutes Moist Heat  15 Minutes    Moist Heat Location  Lumbar Spine      Electrical Stimulation   Electrical Stimulation Location  right lumbar area    Electrical Stimulation Action  IFC    Electrical Stimulation Parameters  sitting    Electrical Stimulation  Goals  Pain      Manual Therapy   Manual Therapy  Soft tissue mobilization    Soft tissue mobilization  to the right lumbar area, c/o increased pain in the proximal scar               PT Short Term Goals - 05/28/18 1422      PT SHORT TERM GOAL #1   Title  independent with HEP    Status  Achieved        PT Long Term Goals - 07/30/18 1422      PT LONG TERM GOAL #1   Title  decrease pain 50%    Status  Partially Met      PT LONG TERM GOAL #2   Title  Increase lumbar AROM 25% in extension, side-bending, and rotation.     Status  Achieved      PT LONG TERM GOAL #3   Title  Understand proper posture and body mechanics for housework and yardwork tasks.     Status  Achieved      PT LONG TERM GOAL #4   Title  25% reported less difficulty sleeping    Status  Partially Met      PT LONG TERM GOAL #5   Title  decrease TUG time to 17 seconds    Status  Achieved            Plan - 08/03/18 1128    Clinical Impression Statement  Patient having less difficulty with walking and ADL's as well as getting up from chair.  She still with some pain and tenderness in the Low back and had a small set back last week with the dentist chair    PT Next Visit Plan  continue to work on function    Consulted and Agree with Plan of Care  Patient       Patient will benefit from skilled therapeutic intervention in order to improve the following deficits and impairments:  Decreased activity tolerance, Decreased mobility, Decreased endurance, Cardiopulmonary status limiting activity, Decreased range of motion, Decreased strength, Postural dysfunction, Improper body mechanics, Impaired flexibility, Pain, Increased muscle spasms, Difficulty walking  Visit Diagnosis: Acute right-sided low back pain with right-sided sciatica  Muscle spasm of back  Difficulty in walking, not elsewhere classified     Problem List Patient Active Problem List   Diagnosis  Date Noted  . Diarrhea 07/03/2018   . Dyspepsia 07/03/2018  . S/P lumbar spinal fusion 07/31/2017  . Right low back pain 03/04/2017  . Arthropod bite 01/28/2017  . Atrial flutter (HCC) 03/06/2016  . Obesity 11/06/2015  . Urge incontinence 01/24/2015  . Estrogen deficiency 10/25/2014  . Lichen planus 12/20/2013  . Encounter for Medicare annual wellness exam 06/17/2013  . Screening mammogram, encounter for 06/15/2012  . Hyperglycemia 06/03/2011  . HYPERTENSION, BENIGN ESSENTIAL 10/23/2010  . CONSTIPATION 10/23/2010  . HEMATOCHEZIA 10/23/2010  . OSTEOARTHRITIS, HANDS, BILATERAL 01/24/2010  . Hypothyroidism 08/24/2008  . Vitamin D deficiency 06/06/2008  . Hyperlipidemia 06/06/2008  . MENOPAUSAL SYNDROME 03/29/2008  . Osteopenia 03/29/2008  . ALLERGIC RHINITIS 01/25/2008  . IBS 01/25/2008  . OSTEOARTHRITIS 01/25/2008  . FIBROMYALGIA 01/25/2008    Jearld Lesch., PT 08/03/2018, 11:30 AM  [facility] [address] [place], [place], [address] Phone: [phone]   Fax:  [phone]  Name: Linda Mcgrath MRN: [id number] Date of Birth: [birth year]

## 2018-08-05 ENCOUNTER — Encounter: Payer: Self-pay | Admitting: Physical Therapy

## 2018-08-05 ENCOUNTER — Ambulatory Visit: Payer: Medicare Other | Admitting: Physical Therapy

## 2018-08-05 DIAGNOSIS — R262 Difficulty in walking, not elsewhere classified: Secondary | ICD-10-CM | POA: Diagnosis not present

## 2018-08-05 DIAGNOSIS — M5441 Lumbago with sciatica, right side: Secondary | ICD-10-CM | POA: Diagnosis not present

## 2018-08-05 DIAGNOSIS — M62830 Muscle spasm of back: Secondary | ICD-10-CM | POA: Diagnosis not present

## 2018-08-05 NOTE — Therapy (Signed)
03/04/2017  . Arthropod bite  01/28/2017  . Atrial flutter ([place]) 03/06/2016  . Obesity 11/06/2015  . Urge incontinence 01/24/2015  . Estrogen deficiency 10/25/2014  . Lichen planus 84/13/2440  . Encounter for Medicare annual wellness exam 06/17/2013  . Screening mammogram, encounter for 06/15/2012  . Hyperglycemia 06/03/2011  . HYPERTENSION, BENIGN ESSENTIAL 10/23/2010  . CONSTIPATION 10/23/2010  . HEMATOCHEZIA 10/23/2010  . OSTEOARTHRITIS, HANDS, BILATERAL 01/24/2010  . Hypothyroidism 08/24/2008  . Vitamin D deficiency 06/06/2008  . Hyperlipidemia 06/06/2008  . MENOPAUSAL SYNDROME 03/29/2008  . Osteopenia 03/29/2008  . ALLERGIC RHINITIS 01/25/2008  . IBS 01/25/2008  . OSTEOARTHRITIS 01/25/2008  . FIBROMYALGIA 01/25/2008    [name], PT 08/05/2018, 1:37 PM  [place] [address] [place], [place], [address] Phone: [phone]   Fax:  [phone]  Name: Linda Mcgrath MRN: [id number] Date of Birth: [birth year]  [place], [place], [address] Phone: [phone]   Fax:  [phone]  Physical Therapy Treatment  Patient Details  Name: Linda Mcgrath MRN: [id number] Date of Birth: 1932-09-12 Referring Provider (PT): [name]   Encounter Date: 08/05/2018  PT End of Session - 08/05/18 1333    Visit Number  20    Date for PT Re-Evaluation  08/20/18    PT Start Time  1245    PT Stop Time  1345    PT Time Calculation (min)  60 min    Activity Tolerance  Patient tolerated treatment well    Behavior During Therapy  [facility] for tasks assessed/performed       Past Medical History:  Diagnosis Date  . Allergic rhinitis   . Asthma   . Cataract    Bil/lens implant  . Colon polyp 1999   small polyp  . Diverticulosis   . Dysrhythmia   . Fibromyalgia   . Hyperlipidemia   . Hypothyroidism   . Internal hemorrhoids   . Leg cramps   . Lung nodule    stable LUL 9 mm  . Menopausal syndrome   . Osteoarthritis   . UTI (urinary tract infection)     Past Surgical History:  Procedure Laterality Date  . Abd U/S  11/1998   negative  . Abd U/S  01/2001   gallbladder polyps  . ABDOMINAL HYSTERECTOMY  1975   total-fibroids  . APPENDECTOMY  1975  . BREAST BIOPSY     benign  . BREAST EXCISIONAL BIOPSY Left 1957  . CARDIOVERSION N/A 03/07/2016   Procedure: CARDIOVERSION;  Surgeon: Adrian Prows, MD;  Location: [place];  Service: Cardiovascular;  Laterality: N/A;  . CATARACT EXTRACTION     bilateral  . CHOLECYSTECTOMY    . COLONOSCOPY  1998   Diverticulosis; polyp\  . COLONOSCOPY  09/2002   Diverticulosis, hem  . COLONOSCOPY  12/2007   diverticulosis, polyp  . DEXA  10/2001   osteopenia  . ELECTROPHYSIOLOGIC STUDY N/A 04/03/2016   Procedure: A-Flutter Ablation;  Surgeon: Will Meredith Leeds, MD;  Location: [place] CV LAB;  Service: Cardiovascular;  Laterality: N/A;  . ESOPHAGOGASTRODUODENOSCOPY  2004  . [place]  scan  01/2001   Negative  . KNEE SURGERY     right knee / 11/2004  . LAMINECTOMY WITH POSTERIOR LATERAL ARTHRODESIS LEVEL 2 N/A 07/31/2017   Procedure: DECOMPRESSIVE LAMINECTOMY LUMABR FOUR-FIVE, LUMBAR FIVE-SACRAL ONE;  Surgeon: Eustace Moore, MD;  Location: [place];  Service: Neurosurgery;  Laterality: N/A;  . laser surgery for glaucoma Left Sep 21, 2014  . ROTATOR CUFF REPAIR     x 2 /right shoulder  . SKIN CANCER EXCISION     pre-melanoma / on face  . TEE WITHOUT CARDIOVERSION N/A 03/07/2016   Procedure: TRANSESOPHAGEAL ECHOCARDIOGRAM (TEE);  Surgeon: Adrian Prows, MD;  Location: [place];  Service: Cardiovascular;  Laterality: N/A;  . TOE SURGERY     rt foot     There were no vitals filed for this visit.  Subjective Assessment - 08/05/18 1251    Subjective  Patient reports that she had a lot more pain in the legs, reports that she feels like we did a lot more exercises last visit    Currently in Pain?  Yes    Pain Score  7     Pain Location  Back   knees   Aggravating Factors   the last treatment really  03/04/2017  . Arthropod bite  01/28/2017  . Atrial flutter ([place]) 03/06/2016  . Obesity 11/06/2015  . Urge incontinence 01/24/2015  . Estrogen deficiency 10/25/2014  . Lichen planus 84/13/2440  . Encounter for Medicare annual wellness exam 06/17/2013  . Screening mammogram, encounter for 06/15/2012  . Hyperglycemia 06/03/2011  . HYPERTENSION, BENIGN ESSENTIAL 10/23/2010  . CONSTIPATION 10/23/2010  . HEMATOCHEZIA 10/23/2010  . OSTEOARTHRITIS, HANDS, BILATERAL 01/24/2010  . Hypothyroidism 08/24/2008  . Vitamin D deficiency 06/06/2008  . Hyperlipidemia 06/06/2008  . MENOPAUSAL SYNDROME 03/29/2008  . Osteopenia 03/29/2008  . ALLERGIC RHINITIS 01/25/2008  . IBS 01/25/2008  . OSTEOARTHRITIS 01/25/2008  . FIBROMYALGIA 01/25/2008    [name], PT 08/05/2018, 1:37 PM  [place] [address] [place], [place], [address] Phone: [phone]   Fax:  [phone]  Name: Linda Mcgrath MRN: [id number] Date of Birth: [birth year]

## 2018-08-11 ENCOUNTER — Ambulatory Visit: Payer: Medicare Other | Admitting: Physical Therapy

## 2018-08-11 ENCOUNTER — Encounter: Payer: Self-pay | Admitting: Physical Therapy

## 2018-08-11 DIAGNOSIS — M62830 Muscle spasm of back: Secondary | ICD-10-CM

## 2018-08-11 DIAGNOSIS — R262 Difficulty in walking, not elsewhere classified: Secondary | ICD-10-CM | POA: Diagnosis not present

## 2018-08-11 DIAGNOSIS — M5441 Lumbago with sciatica, right side: Secondary | ICD-10-CM | POA: Diagnosis not present

## 2018-08-11 NOTE — Therapy (Signed)
[facility] [address] [place], [place], [address] Phone: [phone]   Fax:  [phone]  Physical Therapy Treatment  Patient Details  Name: Linda Mcgrath MRN: [id number] Date of Birth: 1933/05/24 Referring Provider (PT): [name]   Encounter Date: 08/11/2018  PT End of Session - 08/11/18 1522    Visit Number  21    Date for PT Re-Evaluation  08/20/18    PT Start Time  1440    PT Stop Time  1530    PT Time Calculation (min)  50 min    Activity Tolerance  Patient tolerated treatment well    Behavior During Therapy  [facility] for tasks assessed/performed       Past Medical History:  Diagnosis Date  . Allergic rhinitis   . Asthma   . Cataract    Bil/lens implant  . Colon polyp 1999   small polyp  . Diverticulosis   . Dysrhythmia   . Fibromyalgia   . Hyperlipidemia   . Hypothyroidism   . Internal hemorrhoids   . Leg cramps   . Lung nodule    stable LUL 9 mm  . Menopausal syndrome   . Osteoarthritis   . UTI (urinary tract infection)     Past Surgical History:  Procedure Laterality Date  . Abd U/S  11/1998   negative  . Abd U/S  01/2001   gallbladder polyps  . ABDOMINAL HYSTERECTOMY  1975   total-fibroids  . APPENDECTOMY  1975  . BREAST BIOPSY     benign  . BREAST EXCISIONAL BIOPSY Left 1957  . CARDIOVERSION N/A 03/07/2016   Procedure: CARDIOVERSION;  Surgeon: Yates Decamp, MD;  Location: [facility] ENDOSCOPY;  Service: Cardiovascular;  Laterality: N/A;  . CATARACT EXTRACTION     bilateral  . CHOLECYSTECTOMY    . COLONOSCOPY  1998   Diverticulosis; polyp\  . COLONOSCOPY  09/2002   Diverticulosis, hem  . COLONOSCOPY  12/2007   diverticulosis, polyp  . DEXA  10/2001   osteopenia  . ELECTROPHYSIOLOGIC STUDY N/A 04/03/2016   Procedure: A-Flutter Ablation;  Surgeon: Will [name], MD;  [facility] INVASIVE CV LAB;  Service: Cardiovascular;  Laterality: N/A;  . ESOPHAGOGASTRODUODENOSCOPY  2004  . Hida  scan  01/2001   Negative  . KNEE SURGERY     right knee / 11/2004  . LAMINECTOMY WITH POSTERIOR LATERAL ARTHRODESIS LEVEL 2 N/A 07/31/2017   Procedure: DECOMPRESSIVE LAMINECTOMY LUMABR FOUR-FIVE, LUMBAR FIVE-SACRAL ONE;  Surgeon: Tia Alert, MD;  Location: [facility] OR;  Service: Neurosurgery;  Laterality: N/A;  . laser surgery for glaucoma Left Sep 21, 2014  . ROTATOR CUFF REPAIR     x 2 /right shoulder  . SKIN CANCER EXCISION     pre-melanoma / on face  . TEE WITHOUT CARDIOVERSION N/A 03/07/2016   Procedure: TRANSESOPHAGEAL ECHOCARDIOGRAM (TEE);  Surgeon: Yates Decamp, MD;  Location: [facility] ENDOSCOPY;  Service: Cardiovascular;  Laterality: N/A;  . TOE SURGERY     rt foot     There were no vitals filed for this visit.  Subjective Assessment - 08/11/18 1519    Subjective  I am feeling better today.  Not too bad, I know this is helping    Currently in Pain?  Yes    Pain Score  4     Pain Location  Back    Pain Orientation  Right;Lower    Pain Relieving Factors  the treatment "helps the most"  [facility] Adult PT Treatment/Exercise - 08/11/18 0001      Ambulation/Gait   Gait Comments  SPC outside around the building, and the small front parking [employer]      Lumbar Exercises: Aerobic   Nustep  level 4 x 6 minutes      Lumbar Exercises: Machines for Strengthening   Other Lumbar Machine Exercise  seated row 10# 2x10      Lumbar Exercises: Seated   Long Arc Quad on Chair  Both;2 sets;10 reps;Weights    LAQ on Chair Weights (lbs)  3    Other Seated Lumbar Exercises  physioball in lap isometric abs    Other Seated Lumbar Exercises  green tband HS curls      Moist Heat Therapy   Number Minutes Moist Heat  15 Minutes    Moist Heat Location  Lumbar Spine      Electrical Stimulation   Electrical Stimulation Location  right lumbar area    Electrical Stimulation Action  IFC    Electrical Stimulation Parameters  sitting    Electrical Stimulation Goals  Pain                PT Short Term Goals - 05/28/18 1422      PT SHORT TERM GOAL #1   Title  independent with HEP    Status  Achieved        PT Long Term Goals - 08/05/18 1335      PT LONG TERM GOAL #1   Title  decrease pain 50%    Status  Partially Met      PT LONG TERM GOAL #4   Title  25% reported less difficulty sleeping    Status  Partially Met            Plan - 08/11/18 1522    Clinical Impression Statement  Patient reports to me that she feels so much better and really thinks that the PT is helping her be able to be more active and overall has less pain and more strength    PT Next Visit Plan  try to continue to work on function and assure that she can do whatever she needs to do without pain    Consulted and Agree with Plan of Care  Patient       Patient will benefit from skilled therapeutic intervention in order to improve the following deficits and impairments:  Decreased activity tolerance, Decreased mobility, Decreased endurance, Cardiopulmonary status limiting activity, Decreased range of motion, Decreased strength, Postural dysfunction, Improper body mechanics, Impaired flexibility, Pain, Increased muscle spasms, Difficulty walking  Visit Diagnosis: Acute right-sided low back pain with right-sided sciatica  Muscle spasm of back  Difficulty in walking, not elsewhere classified     Problem List Patient Active Problem List   Diagnosis Date Noted  . Diarrhea 07/03/2018  . Dyspepsia 07/03/2018  . S/P lumbar spinal fusion 07/31/2017  . Right low back pain 03/04/2017  . Arthropod bite 01/28/2017  . Atrial flutter (HCC) 03/06/2016  . Obesity 11/06/2015  . Urge incontinence 01/24/2015  . Estrogen deficiency 10/25/2014  . Lichen planus 12/20/2013  . Encounter for Medicare annual wellness exam 06/17/2013  . Screening mammogram, encounter for 06/15/2012  . Hyperglycemia 06/03/2011  . HYPERTENSION, BENIGN ESSENTIAL 10/23/2010  . CONSTIPATION 10/23/2010   . HEMATOCHEZIA 10/23/2010  . OSTEOARTHRITIS, HANDS, BILATERAL 01/24/2010  . Hypothyroidism 08/24/2008  . Vitamin D deficiency 06/06/2008  . Hyperlipidemia 06/06/2008  . MENOPAUSAL SYNDROME 03/29/2008  . Osteopenia 03/29/2008  .  ALLERGIC RHINITIS 01/25/2008  . IBS 01/25/2008  . OSTEOARTHRITIS 01/25/2008  . FIBROMYALGIA 01/25/2008    Jearld Lesch., PT 08/11/2018, 3:23 PM  [facility] [address] [place], [place], [address] Phone: [phone]   Fax:  [phone]  Name: Linda Mcgrath MRN: [id number] Date of Birth: 09-24-1932

## 2018-08-13 ENCOUNTER — Ambulatory Visit: Payer: Medicare Other | Admitting: Physical Therapy

## 2018-08-13 ENCOUNTER — Encounter: Payer: Self-pay | Admitting: Physical Therapy

## 2018-08-13 DIAGNOSIS — M5441 Lumbago with sciatica, right side: Secondary | ICD-10-CM

## 2018-08-13 DIAGNOSIS — M62830 Muscle spasm of back: Secondary | ICD-10-CM

## 2018-08-13 DIAGNOSIS — R262 Difficulty in walking, not elsewhere classified: Secondary | ICD-10-CM | POA: Diagnosis not present

## 2018-08-13 NOTE — Therapy (Signed)
[facility] [address] [place], [place], [address] Phone: [phone]   Fax:  [phone]  Physical Therapy Treatment  Patient Details  Name: Linda Mcgrath MRN: [id number] Date of Birth: [birth year] Referring Provider (PT): [name]   Encounter Date: 08/13/2018  PT End of Session - 08/13/18 1555    Visit Number  22    Date for PT Re-Evaluation  08/20/18    PT Start Time  1430    PT Stop Time  1519    PT Time Calculation (min)  49 min    Activity Tolerance  Patient tolerated treatment well    Behavior During Therapy  [facility] for tasks assessed/performed       Past Medical History:  Diagnosis Date  . Allergic rhinitis   . Asthma   . Cataract    Bil/lens implant  . Colon polyp 1999   small polyp  . Diverticulosis   . Dysrhythmia   . Fibromyalgia   . Hyperlipidemia   . Hypothyroidism   . Internal hemorrhoids   . Leg cramps   . Lung nodule    stable LUL 9 mm  . Menopausal syndrome   . Osteoarthritis   . UTI (urinary tract infection)     Past Surgical History:  Procedure Laterality Date  . Abd U/S  11/1998   negative  . Abd U/S  01/2001   gallbladder polyps  . ABDOMINAL HYSTERECTOMY  1975   total-fibroids  . APPENDECTOMY  1975  . BREAST BIOPSY     benign  . BREAST EXCISIONAL BIOPSY Left 1957  . CARDIOVERSION N/A 03/07/2016   Procedure: CARDIOVERSION;  Surgeon: Yates Decamp, MD;  Location: [facility] ENDOSCOPY;  Service: Cardiovascular;  Laterality: N/A;  . CATARACT EXTRACTION     bilateral  . CHOLECYSTECTOMY    . COLONOSCOPY  1998   Diverticulosis; polyp\  . COLONOSCOPY  09/2002   Diverticulosis, hem  . COLONOSCOPY  12/2007   diverticulosis, polyp  . DEXA  10/2001   osteopenia  . ELECTROPHYSIOLOGIC STUDY N/A 04/03/2016   Procedure: A-Flutter Ablation;  Surgeon: Will [name], MD;  [facility] IN[facility];  Service: Cardiovascular;  Laterality: N/A;  . ESOPHAGOGASTRODUODENOSCOPY  2004  . Hida  scan  01/2001   Negative  . KNEE SURGERY     right knee / 11/2004  . LAMINECTOMY WITH POSTERIOR LATERAL ARTHRODESIS LEVEL 2 N/A 07/31/2017   Procedure: DECOMPRESSIVE LAMINECTOMY LUMABR FOUR-FIVE, LUMBAR FIVE-SACRAL ONE;  Surgeon: Tia Alert, MD;  Location: [facility] OR;  Service: Neurosurgery;  Laterality: N/A;  . laser surgery for glaucoma Left Sep 21, 2014  . ROTATOR CUFF REPAIR     x 2 /right shoulder  . SKIN CANCER EXCISION     pre-melanoma / on face  . TEE WITHOUT CARDIOVERSION N/A 03/07/2016   Procedure: TRANSESOPHAGEAL ECHOCARDIOGRAM (TEE);  Surgeon: Yates Decamp, MD;  Location: [facility] ENDOSCOPY;  Service: Cardiovascular;  Laterality: N/A;  . TOE SURGERY     rt foot     There were no vitals filed for this visit.  Subjective Assessment - 08/13/18 1551    Subjective  Patient reports that she is feeling good    Currently in Pain?  Yes    Pain Score  3     Pain Location  Back    Pain Orientation  Right;Lower    Pain Descriptors / Indicators  Aching  [facility] Adult PT Treatment/Exercise - 08/13/18 0001      Ambulation/Gait   Gait Comments  gait in the hall x 250 feet without device, close supervision      Lumbar Exercises: Aerobic   Nustep  level 4 x 6 minutes      Lumbar Exercises: Standing   Scapular Retraction  20 reps;Theraband    Theraband Level (Scapular Retraction)  Level 3 (Green)    Row  20 reps;Theraband    Theraband Level (Row)  Level 3 (Green)    Other Standing Lumbar Exercises  marches, hip abduction and extension 3# 2x10      Lumbar Exercises: Seated   Long Arc Quad on Chair  Both;2 sets;10 reps;Weights    Other Seated Lumbar Exercises  green tband HS curls      Moist Heat Therapy   Number Minutes Moist Heat  15 Minutes    Moist Heat Location  Lumbar Spine      Electrical Stimulation   Electrical Stimulation Location  right lumbar area    Electrical Stimulation Action  IFC    Electrical Stimulation Parameters  sitting     Electrical Stimulation Goals  Pain               PT Short Term Goals - 05/28/18 1422      PT SHORT TERM GOAL #1   Title  independent with HEP    Status  Achieved        PT Long Term Goals - 08/13/18 1603      PT LONG TERM GOAL #1   Title  decrease pain 50%    Status  Partially Met      PT LONG TERM GOAL #4   Title  25% reported less difficulty sleeping    Status  Achieved            Plan - 08/13/18 1601    Clinical Impression Statement  Patient continues to do better with her ability to walk and be independent , tried walking without device on level surfaces.  She did well but needed supervision.  She has pain that is up and down, thinks hip extension has caused some pain, but we tried this again to try to strengthen    PT Next Visit Plan  try to continue to work on function and assure that she can do whatever she needs to do without pain    Consulted and Agree with Plan of Care  Patient       Patient will benefit from skilled therapeutic intervention in order to improve the following deficits and impairments:  Decreased activity tolerance, Decreased mobility, Decreased endurance, Cardiopulmonary status limiting activity, Decreased range of motion, Decreased strength, Postural dysfunction, Improper body mechanics, Impaired flexibility, Pain, Increased muscle spasms, Difficulty walking  Visit Diagnosis: Acute right-sided low back pain with right-sided sciatica  Muscle spasm of back  Difficulty in walking, not elsewhere classified     Problem List Patient Active Problem List   Diagnosis Date Noted  . Diarrhea 07/03/2018  . Dyspepsia 07/03/2018  . S/P lumbar spinal fusion 07/31/2017  . Right low back pain 03/04/2017  . Arthropod bite 01/28/2017  . Atrial flutter (HCC) 03/06/2016  . Obesity 11/06/2015  . Urge incontinence 01/24/2015  . Estrogen deficiency 10/25/2014  . Lichen planus 12/20/2013  . Encounter for Medicare annual wellness exam 06/17/2013   . Screening mammogram, encounter for 06/15/2012  . Hyperglycemia 06/03/2011  . HYPERTENSION, BENIGN ESSENTIAL 10/23/2010  . CONSTIPATION 10/23/2010  .  HEMATOCHEZIA 10/23/2010  . OSTEOARTHRITIS, HANDS, BILATERAL 01/24/2010  . Hypothyroidism 08/24/2008  . Vitamin D deficiency 06/06/2008  . Hyperlipidemia 06/06/2008  . MENOPAUSAL SYNDROME 03/29/2008  . Osteopenia 03/29/2008  . ALLERGIC RHINITIS 01/25/2008  . IBS 01/25/2008  . OSTEOARTHRITIS 01/25/2008  . FIBROMYALGIA 01/25/2008    Jearld Lesch., PT 08/13/2018, 4:05 PM  [facility] [address] [place], [place], [address] Phone: [phone]   Fax:  [phone]  Name: Linda Mcgrath MRN: [id number] Date of Birth: 05-Apr-1933

## 2018-08-18 ENCOUNTER — Ambulatory Visit: Payer: Medicare Other | Admitting: Physical Therapy

## 2018-08-18 ENCOUNTER — Encounter: Payer: Self-pay | Admitting: Physical Therapy

## 2018-08-18 DIAGNOSIS — R262 Difficulty in walking, not elsewhere classified: Secondary | ICD-10-CM | POA: Diagnosis not present

## 2018-08-18 DIAGNOSIS — M62830 Muscle spasm of back: Secondary | ICD-10-CM | POA: Diagnosis not present

## 2018-08-18 DIAGNOSIS — M5441 Lumbago with sciatica, right side: Secondary | ICD-10-CM

## 2018-08-18 NOTE — Therapy (Signed)
[place], [place], [address] Phone: [phone]   Fax:  [phone]  Physical Therapy Treatment  Patient Details  Name: Linda Mcgrath MRN: [id number] Date of Birth: 1933/06/30 Referring Provider (PT): [name]   Encounter Date: 08/18/2018  PT End of Session - 08/18/18 1512    Visit Number  23    Date for PT Re-Evaluation  08/20/18    PT Start Time  1430    PT Stop Time  1530    PT Time Calculation (min)  60 min    Activity Tolerance  Patient tolerated treatment well    Behavior During Therapy  [facility] for tasks assessed/performed       Past Medical History:  Diagnosis Date  . Allergic rhinitis   . Asthma   . Cataract    Bil/lens implant  . Colon polyp 1999   small polyp  . Diverticulosis   . Dysrhythmia   . Fibromyalgia   . Hyperlipidemia   . Hypothyroidism   . Internal hemorrhoids   . Leg cramps   . Lung nodule    stable LUL 9 mm  . Menopausal syndrome   . Osteoarthritis   . UTI (urinary tract infection)     Past Surgical History:  Procedure Laterality Date  . Abd U/S  11/1998   negative  . Abd U/S  01/2001   gallbladder polyps  . ABDOMINAL HYSTERECTOMY  1975   total-fibroids  . APPENDECTOMY  1975  . BREAST BIOPSY     benign  . BREAST EXCISIONAL BIOPSY Left 1957  . CARDIOVERSION N/A 03/07/2016   Procedure: CARDIOVERSION;  Surgeon: Adrian Prows, MD;  Location: [place];  Service: Cardiovascular;  Laterality: N/A;  . CATARACT EXTRACTION     bilateral  . CHOLECYSTECTOMY    . COLONOSCOPY  1998   Diverticulosis; polyp_0  . COLONOSCOPY  09/2002   Diverticulosis, hem  . COLONOSCOPY  12/2007   diverticulosis, polyp  . DEXA  10/2001   osteopenia  . ELECTROPHYSIOLOGIC STUDY N/A 04/03/2016   Procedure: A-Flutter Ablation;  Surgeon: Will Meredith Leeds, MD;  Location: [facility];  Service: Cardiovascular;  Laterality: N/A;  . ESOPHAGOGASTRODUODENOSCOPY  2004  . Hida  scan  01/2001   Negative  . KNEE SURGERY     right knee / 11/2004  . LAMINECTOMY WITH POSTERIOR LATERAL ARTHRODESIS LEVEL 2 N/A 07/31/2017   Procedure: DECOMPRESSIVE LAMINECTOMY LUMABR FOUR-FIVE, LUMBAR FIVE-SACRAL ONE;  Surgeon: Eustace Moore, MD;  Location: [facility];  Service: Neurosurgery;  Laterality: N/A;  . laser surgery for glaucoma Left Sep 21, 2014  . ROTATOR CUFF REPAIR     x 2 /right shoulder  . SKIN CANCER EXCISION     pre-melanoma / on face  . TEE WITHOUT CARDIOVERSION N/A 03/07/2016   Procedure: TRANSESOPHAGEAL ECHOCARDIOGRAM (TEE);  Surgeon: Adrian Prows, MD;  Location: [place];  Service: Cardiovascular;  Laterality: N/A;  . TOE SURGERY     rt foot     There were no vitals filed for this visit.  Subjective Assessment - 08/18/18 1444    Subjective  Patient reports that she has been doing more and more stuff, reports that hte right low back has more pain today    Currently in Pain?  Yes    Pain Score  5     Pain Location  Back    Pain Orientation  Right;Lower    Pain Descriptors / Indicators  Aching  [place], [place], [address] Phone: [phone]   Fax:  [phone]  Physical Therapy Treatment  Patient Details  Name: Linda Mcgrath MRN: [id number] Date of Birth: March 06, 1933 Referring Provider (PT): [name]   Encounter Date: 08/18/2018  PT End of Session - 08/18/18 1512    Visit Number  23    Date for PT Re-Evaluation  08/20/18    PT Start Time  1430    PT Stop Time  1530    PT Time Calculation (min)  60 min    Activity Tolerance  Patient tolerated treatment well    Behavior During Therapy  [facility] for tasks assessed/performed       Past Medical History:  Diagnosis Date  . Allergic rhinitis   . Asthma   . Cataract    Bil/lens implant  . Colon polyp 1999   small polyp  . Diverticulosis   . Dysrhythmia   . Fibromyalgia   . Hyperlipidemia   . Hypothyroidism   . Internal hemorrhoids   . Leg cramps   . Lung nodule    stable LUL 9 mm  . Menopausal syndrome   . Osteoarthritis   . UTI (urinary tract infection)     Past Surgical History:  Procedure Laterality Date  . Abd U/S  11/1998   negative  . Abd U/S  01/2001   gallbladder polyps  . ABDOMINAL HYSTERECTOMY  1975   total-fibroids  . APPENDECTOMY  1975  . BREAST BIOPSY     benign  . BREAST EXCISIONAL BIOPSY Left 1957  . CARDIOVERSION N/A 03/07/2016   Procedure: CARDIOVERSION;  Surgeon: Adrian Prows, MD;  Location: [place];  Service: Cardiovascular;  Laterality: N/A;  . CATARACT EXTRACTION     bilateral  . CHOLECYSTECTOMY    . COLONOSCOPY  1998   Diverticulosis; polyp_0  . COLONOSCOPY  09/2002   Diverticulosis, hem  . COLONOSCOPY  12/2007   diverticulosis, polyp  . DEXA  10/2001   osteopenia  . ELECTROPHYSIOLOGIC STUDY N/A 04/03/2016   Procedure: A-Flutter Ablation;  Surgeon: Will Meredith Leeds, MD;  Location: [place] CV LAB;  Service: Cardiovascular;  Laterality: N/A;  . ESOPHAGOGASTRODUODENOSCOPY  2004  . Hida  scan  01/2001   Negative  . KNEE SURGERY     right knee / 11/2004  . LAMINECTOMY WITH POSTERIOR LATERAL ARTHRODESIS LEVEL 2 N/A 07/31/2017   Procedure: DECOMPRESSIVE LAMINECTOMY LUMABR FOUR-FIVE, LUMBAR FIVE-SACRAL ONE;  Surgeon: Eustace Moore, MD;  Location: [place];  Service: Neurosurgery;  Laterality: N/A;  . laser surgery for glaucoma Left Sep 21, 2014  . ROTATOR CUFF REPAIR     x 2 /right shoulder  . SKIN CANCER EXCISION     pre-melanoma / on face  . TEE WITHOUT CARDIOVERSION N/A 03/07/2016   Procedure: TRANSESOPHAGEAL ECHOCARDIOGRAM (TEE);  Surgeon: Adrian Prows, MD;  Location: [facility];  Service: Cardiovascular;  Laterality: N/A;  . TOE SURGERY     rt foot     There were no vitals filed for this visit.  Subjective Assessment - 08/18/18 1444    Subjective  Patient reports that she has been doing more and more stuff, reports that hte right low back has more pain today    Currently in Pain?  Yes    Pain Score  5     Pain Location  Back    Pain Orientation  Right;Lower    Pain Descriptors / Indicators  Aching  [place], [place], [address] Phone: [phone]   Fax:  [phone]  Physical Therapy Treatment  Patient Details  Name: Linda Mcgrath MRN: [id number] Date of Birth: March 06, 1933 Referring Provider (PT): [name]   Encounter Date: 08/18/2018  PT End of Session - 08/18/18 1512    Visit Number  23    Date for PT Re-Evaluation  08/20/18    PT Start Time  1430    PT Stop Time  1530    PT Time Calculation (min)  60 min    Activity Tolerance  Patient tolerated treatment well    Behavior During Therapy  [facility] for tasks assessed/performed       Past Medical History:  Diagnosis Date  . Allergic rhinitis   . Asthma   . Cataract    Bil/lens implant  . Colon polyp 1999   small polyp  . Diverticulosis   . Dysrhythmia   . Fibromyalgia   . Hyperlipidemia   . Hypothyroidism   . Internal hemorrhoids   . Leg cramps   . Lung nodule    stable LUL 9 mm  . Menopausal syndrome   . Osteoarthritis   . UTI (urinary tract infection)     Past Surgical History:  Procedure Laterality Date  . Abd U/S  11/1998   negative  . Abd U/S  01/2001   gallbladder polyps  . ABDOMINAL HYSTERECTOMY  1975   total-fibroids  . APPENDECTOMY  1975  . BREAST BIOPSY     benign  . BREAST EXCISIONAL BIOPSY Left 1957  . CARDIOVERSION N/A 03/07/2016   Procedure: CARDIOVERSION;  Surgeon: Adrian Prows, MD;  Location: [place];  Service: Cardiovascular;  Laterality: N/A;  . CATARACT EXTRACTION     bilateral  . CHOLECYSTECTOMY    . COLONOSCOPY  1998   Diverticulosis; polyp_0  . COLONOSCOPY  09/2002   Diverticulosis, hem  . COLONOSCOPY  12/2007   diverticulosis, polyp  . DEXA  10/2001   osteopenia  . ELECTROPHYSIOLOGIC STUDY N/A 04/03/2016   Procedure: A-Flutter Ablation;  Surgeon: Will Meredith Leeds, MD;  Location: [facility] CV LAB;  Service: Cardiovascular;  Laterality: N/A;  . ESOPHAGOGASTRODUODENOSCOPY  2004  . Hida  scan  01/2001   Negative  . KNEE SURGERY     right knee / 11/2004  . LAMINECTOMY WITH POSTERIOR LATERAL ARTHRODESIS LEVEL 2 N/A 07/31/2017   Procedure: DECOMPRESSIVE LAMINECTOMY LUMABR FOUR-FIVE, LUMBAR FIVE-SACRAL ONE;  Surgeon: Eustace Moore, MD;  Location: [place];  Service: Neurosurgery;  Laterality: N/A;  . laser surgery for glaucoma Left Sep 21, 2014  . ROTATOR CUFF REPAIR     x 2 /right shoulder  . SKIN CANCER EXCISION     pre-melanoma / on face  . TEE WITHOUT CARDIOVERSION N/A 03/07/2016   Procedure: TRANSESOPHAGEAL ECHOCARDIOGRAM (TEE);  Surgeon: Adrian Prows, MD;  Location: [facility];  Service: Cardiovascular;  Laterality: N/A;  . TOE SURGERY     rt foot     There were no vitals filed for this visit.  Subjective Assessment - 08/18/18 1444    Subjective  Patient reports that she has been doing more and more stuff, reports that hte right low back has more pain today    Currently in Pain?  Yes    Pain Score  5     Pain Location  Back    Pain Orientation  Right;Lower    Pain Descriptors / Indicators  Aching

## 2018-08-20 ENCOUNTER — Encounter: Payer: Self-pay | Admitting: Physical Therapy

## 2018-08-20 ENCOUNTER — Ambulatory Visit: Payer: Medicare Other | Admitting: Physical Therapy

## 2018-08-20 DIAGNOSIS — M62830 Muscle spasm of back: Secondary | ICD-10-CM | POA: Diagnosis not present

## 2018-08-20 DIAGNOSIS — R262 Difficulty in walking, not elsewhere classified: Secondary | ICD-10-CM | POA: Diagnosis not present

## 2018-08-20 DIAGNOSIS — M5441 Lumbago with sciatica, right side: Secondary | ICD-10-CM

## 2018-08-20 NOTE — Therapy (Signed)
Cardiopulmonary status limiting activity, Decreased range of motion, Decreased strength, Postural dysfunction, Improper body mechanics, Impaired flexibility, Pain, Increased  muscle spasms, Difficulty walking  Visit Diagnosis: Acute right-sided low back pain with right-sided sciatica - Plan: PT plan of care cert/re-cert  Muscle spasm of back - Plan: PT plan of care cert/re-cert  Difficulty in walking, not elsewhere classified - Plan: PT plan of care cert/re-cert     Problem List Patient Active Problem List   Diagnosis Date Noted  . Diarrhea 07/03/2018  . Dyspepsia 07/03/2018  . S/P lumbar spinal fusion 07/31/2017  . Right low back pain 03/04/2017  . Arthropod bite 01/28/2017  . Atrial flutter ([place]) 03/06/2016  . Obesity 11/06/2015  . Urge incontinence 01/24/2015  . Estrogen deficiency 10/25/2014  . Lichen planus 16/05/9603  . Encounter for Medicare annual wellness exam 06/17/2013  . Screening mammogram, encounter for 06/15/2012  . Hyperglycemia 06/03/2011  . HYPERTENSION, BENIGN ESSENTIAL 10/23/2010  . CONSTIPATION 10/23/2010  . HEMATOCHEZIA 10/23/2010  . OSTEOARTHRITIS, HANDS, BILATERAL 01/24/2010  . Hypothyroidism 08/24/2008  . Vitamin D deficiency 06/06/2008  . Hyperlipidemia 06/06/2008  . MENOPAUSAL SYNDROME 03/29/2008  . Osteopenia 03/29/2008  . ALLERGIC RHINITIS 01/25/2008  . IBS 01/25/2008  . OSTEOARTHRITIS 01/25/2008  . FIBROMYALGIA 01/25/2008    [name], PT 08/20/2018, 3:20 PM  [place] [address] [place], [place], [address] Phone: [phone]   Fax:  [phone]  Name: Linda Mcgrath MRN: [id number] Date of Birth: 01-23-1933  [place], [place], [address] Phone: [phone]   Fax:  [phone]  Physical Therapy Treatment  Patient Details  Name: Linda Mcgrath MRN: [id number] Date of Birth: Jan 11, 1933 Referring Provider (PT): [name]   Encounter Date: 08/20/2018  PT End of Session - 08/20/18 1513    Visit Number  24    Date for PT Re-Evaluation  09/21/18    PT Start Time  1433    PT Stop Time  1520    PT Time Calculation (min)  47 min    Activity Tolerance  Patient tolerated treatment well    Behavior During Therapy  [facility] for tasks assessed/performed       Past Medical History:  Diagnosis Date  . Allergic rhinitis   . Asthma   . Cataract    Bil/lens implant  . Colon polyp 1999   small polyp  . Diverticulosis   . Dysrhythmia   . Fibromyalgia   . Hyperlipidemia   . Hypothyroidism   . Internal hemorrhoids   . Leg cramps   . Lung nodule    stable LUL 9 mm  . Menopausal syndrome   . Osteoarthritis   . UTI (urinary tract infection)     Past Surgical History:  Procedure Laterality Date  . Abd U/S  11/1998   negative  . Abd U/S  01/2001   gallbladder polyps  . ABDOMINAL HYSTERECTOMY  1975   total-fibroids  . APPENDECTOMY  1975  . BREAST BIOPSY     benign  . BREAST EXCISIONAL BIOPSY Left 1957  . CARDIOVERSION N/A 03/07/2016   Procedure: CARDIOVERSION;  Surgeon: Adrian Prows, MD;  Location: [place];  Service: Cardiovascular;  Laterality: N/A;  . CATARACT EXTRACTION     bilateral  . CHOLECYSTECTOMY    . COLONOSCOPY  1998   Diverticulosis; polyp\  . COLONOSCOPY  09/2002   Diverticulosis, hem  . COLONOSCOPY  12/2007   diverticulosis, polyp  . DEXA  10/2001   osteopenia  . ELECTROPHYSIOLOGIC STUDY N/A 04/03/2016   Procedure: A-Flutter Ablation;  Surgeon: Will Meredith Leeds, MD;  Location: [place] CV LAB;  Service: Cardiovascular;  Laterality: N/A;  . ESOPHAGOGASTRODUODENOSCOPY  2004  . H[place]  scan  01/2001   Negative  . KNEE SURGERY     right knee / 11/2004  . LAMINECTOMY WITH POSTERIOR LATERAL ARTHRODESIS LEVEL 2 N/A 07/31/2017   Procedure: DECOMPRESSIVE LAMINECTOMY LUMABR FOUR-FIVE, LUMBAR FIVE-SACRAL ONE;  Surgeon: Eustace Moore, MD;  Location: [place];  Service: Neurosurgery;  Laterality: N/A;  . laser surgery for glaucoma Left Sep 21, 2014  . ROTATOR CUFF REPAIR     x 2 /right shoulder  . SKIN CANCER EXCISION     pre-melanoma / on face  . TEE WITHOUT CARDIOVERSION N/A 03/07/2016   Procedure: TRANSESOPHAGEAL ECHOCARDIOGRAM (TEE);  Surgeon: Adrian Prows, MD;  Location: [place];  Service: Cardiovascular;  Laterality: N/A;  . TOE SURGERY     rt foot     There were no vitals filed for this visit.  Subjective Assessment - 08/20/18 1510    Subjective  Patient reports that the more she does the more she has some discomfort in the low back, she is reporting increased ability to do things and be active    Currently in Pain?  Yes    Pain Score  3     Pain Location  Back    Pain Orientation  Right;Lower  Cardiopulmonary status limiting activity, Decreased range of motion, Decreased strength, Postural dysfunction, Improper body mechanics, Impaired flexibility, Pain, Increased  muscle spasms, Difficulty walking  Visit Diagnosis: Acute right-sided low back pain with right-sided sciatica - Plan: PT plan of care cert/re-cert  Muscle spasm of back - Plan: PT plan of care cert/re-cert  Difficulty in walking, not elsewhere classified - Plan: PT plan of care cert/re-cert     Problem List Patient Active Problem List   Diagnosis Date Noted  . Diarrhea 07/03/2018  . Dyspepsia 07/03/2018  . S/P lumbar spinal fusion 07/31/2017  . Right low back pain 03/04/2017  . Arthropod bite 01/28/2017  . Atrial flutter ([place]) 03/06/2016  . Obesity 11/06/2015  . Urge incontinence 01/24/2015  . Estrogen deficiency 10/25/2014  . Lichen planus 16/05/9603  . Encounter for Medicare annual wellness exam 06/17/2013  . Screening mammogram, encounter for 06/15/2012  . Hyperglycemia 06/03/2011  . HYPERTENSION, BENIGN ESSENTIAL 10/23/2010  . CONSTIPATION 10/23/2010  . HEMATOCHEZIA 10/23/2010  . OSTEOARTHRITIS, HANDS, BILATERAL 01/24/2010  . Hypothyroidism 08/24/2008  . Vitamin D deficiency 06/06/2008  . Hyperlipidemia 06/06/2008  . MENOPAUSAL SYNDROME 03/29/2008  . Osteopenia 03/29/2008  . ALLERGIC RHINITIS 01/25/2008  . IBS 01/25/2008  . OSTEOARTHRITIS 01/25/2008  . FIBROMYALGIA 01/25/2008    [name], PT 08/20/2018, 3:20 PM  [place], [place], [address] Phone: [phone]   Fax:  [phone]  Name: Linda Mcgrath MRN: [id number] Date of Birth: 01-23-1933

## 2018-08-31 ENCOUNTER — Ambulatory Visit: Payer: Medicare Other | Attending: Family Medicine | Admitting: Physical Therapy

## 2018-08-31 ENCOUNTER — Encounter: Payer: Self-pay | Admitting: Physical Therapy

## 2018-08-31 DIAGNOSIS — M5441 Lumbago with sciatica, right side: Secondary | ICD-10-CM | POA: Insufficient documentation

## 2018-08-31 DIAGNOSIS — R262 Difficulty in walking, not elsewhere classified: Secondary | ICD-10-CM | POA: Diagnosis not present

## 2018-08-31 DIAGNOSIS — M62830 Muscle spasm of back: Secondary | ICD-10-CM | POA: Diagnosis not present

## 2018-08-31 NOTE — Therapy (Signed)
[facility] [facility] [address] [place], [place], [address] Phone: [phone]   Fax:  [phone]  Physical Therapy Treatment  Patient Details  Name: Linda Mcgrath MRN: [id number] Date of Birth: April 26, 1933 Referring Provider (PT): [name]   Encounter Date: 08/31/2018  PT End of Session - 08/31/18 1336    Visit Number  25    Date for PT Re-Evaluation  09/21/18    PT Start Time  1300    PT Stop Time  1355    PT Time Calculation (min)  55 min    Activity Tolerance  Patient tolerated treatment well    Behavior During Therapy  [facility] for tasks assessed/performed       Past Medical History:  Diagnosis Date  . Allergic rhinitis   . Asthma   . Cataract    Bil/lens implant  . Colon polyp 1999   small polyp  . Diverticulosis   . Dysrhythmia   . Fibromyalgia   . Hyperlipidemia   . Hypothyroidism   . Internal hemorrhoids   . Leg cramps   . Lung nodule    stable LUL 9 mm  . Menopausal syndrome   . Osteoarthritis   . UTI (urinary tract infection)     Past Surgical History:  Procedure Laterality Date  . Abd U/S  11/1998   negative  . Abd U/S  01/2001   gallbladder polyps  . ABDOMINAL HYSTERECTOMY  1975   total-fibroids  . APPENDECTOMY  1975  . BREAST BIOPSY     benign  . BREAST EXCISIONAL BIOPSY Left 1957  . CARDIOVERSION N/A 03/07/2016   Procedure: CARDIOVERSION;  Surgeon: Yates Decamp, MD;  Location: [facility] ENDOSCOPY;  Service: Cardiovascular;  Laterality: N/A;  . CATARACT EXTRACTION     bilateral  . CHOLECYSTECTOMY    . COLONOSCOPY  1998   Diverticulosis; polyp\  . COLONOSCOPY  09/2002   Diverticulosis, hem  . COLONOSCOPY  12/2007   diverticulosis, polyp  . DEXA  10/2001   osteopenia  . ELECTROPHYSIOLOGIC STUDY N/A 04/03/2016   Procedure: A-Flutter Ablation;  Surgeon: Will [name], MD;  [facility] INVASIVE CV LAB;  Service: Cardiovascular;  Laterality: N/A;  . ESOPHAGOGASTRODUODENOSCOPY  2004  . Hida  scan  01/2001   Negative  . KNEE SURGERY     right knee / 11/2004  . LAMINECTOMY WITH POSTERIOR LATERAL ARTHRODESIS LEVEL 2 N/A 07/31/2017   Procedure: DECOMPRESSIVE LAMINECTOMY LUMABR FOUR-FIVE, LUMBAR FIVE-SACRAL ONE;  Surgeon: Tia Alert, MD;  Location: [facility] OR;  Service: Neurosurgery;  Laterality: N/A;  . laser surgery for glaucoma Left Sep 21, 2014  . ROTATOR CUFF REPAIR     x 2 /right shoulder  . SKIN CANCER EXCISION     pre-melanoma / on face  . TEE WITHOUT CARDIOVERSION N/A 03/07/2016   Procedure: TRANSESOPHAGEAL ECHOCARDIOGRAM (TEE);  Surgeon: Yates Decamp, MD;  Location: [facility] ENDOSCOPY;  Service: Cardiovascular;  Laterality: N/A;  . TOE SURGERY     rt foot     There were no vitals filed for this visit.  Subjective Assessment - 08/31/18 1309    Subjective  Patient reports that she has been putting away Christmas things and is a little sore in the right low back.    Currently in Pain?  Yes    Pain Score  4     Pain Location  Back    Pain Orientation  Right;Lower    Aggravating Factors   standing 15-20 minutes, can  sweep but cannot vacuum yet                       [facility] Adult PT Treatment/Exercise - 08/31/18 0001      Lumbar Exercises: Aerobic   Nustep  level 4 x 6 minutes      Lumbar Exercises: Machines for Strengthening   Cybex Lumbar Extension  black tband small motions x 15    Cybex Knee Extension  5# 2x10 mostly end range    Cybex Knee Flexion  20# 2x10      Lumbar Exercises: Standing   Scapular Retraction  20 reps;Theraband    Theraband Level (Scapular Retraction)  Level 3 (Green)    Row  20 reps;Theraband    Theraband Level (Row)  Level 3 (Green)    Other Standing Lumbar Exercises  W backs, hip extension and hip abduction all 2x10 with some cues for form. and hip flexion    Other Standing Lumbar Exercises  practiced vacuum using dowel rod and having her shift weighta nd step with it      Lumbar Exercises: Seated   Other Seated Lumbar Exercises   physioball in lap isometric abs    Other Seated Lumbar Exercises  ball between knees squeeze, isometric abd      Moist Heat Therapy   Number Minutes Moist Heat  15 Minutes    Moist Heat Location  Lumbar Spine      Electrical Stimulation   Electrical Stimulation Location  right lumbar area    Electrical Stimulation Action  IFC    Electrical Stimulation Parameters  sitting    Electrical Stimulation Goals  Pain               PT Short Term Goals - 05/28/18 1422      PT SHORT TERM GOAL #1   Title  independent with HEP    Status  Achieved        PT Long Term Goals - 08/20/18 1519      PT LONG TERM GOAL #1   Title  decrease pain 50%    Status  Partially Met      PT LONG TERM GOAL #2   Title  Increase lumbar AROM 25% in extension, side-bending, and rotation.     Status  Achieved      PT LONG TERM GOAL #3   Title  Understand proper posture and body mechanics for housework and yardwork tasks.     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 or unable to vacuum due to the back pain, started education about foot prop with standing and the vacuum dance, had her try and she needed cues to perform and will need further practice since this is not natural for her to do    PT Next Visit Plan  work on her functional independence and safety with ADL's    Consulted and Agree with Plan of Care  Patient       Patient will benefit from skilled  therapeutic intervention in order to improve the following deficits and impairments:  Decreased activity tolerance, Decreased mobility, Decreased endurance, Cardiopulmonary status limiting activity, Decreased range of motion, Decreased  strength, Postural dysfunction, Improper body mechanics, Impaired flexibility, Pain, Increased muscle spasms, Difficulty walking  Visit Diagnosis: Acute right-sided low back pain with right-sided sciatica  Muscle spasm of back  Difficulty in walking, not elsewhere classified     Problem List Patient Active Problem List   Diagnosis Date Noted  . Diarrhea 07/03/2018  . Dyspepsia 07/03/2018  . S/P lumbar spinal fusion 07/31/2017  . Right low back pain 03/04/2017  . Arthropod bite 01/28/2017  . Atrial flutter (HCC) 03/06/2016  . Obesity 11/06/2015  . Urge incontinence 01/24/2015  . Estrogen deficiency 10/25/2014  . Lichen planus 12/20/2013  . Encounter for Medicare annual wellness exam 06/17/2013  . Screening mammogram, encounter for 06/15/2012  . Hyperglycemia 06/03/2011  . HYPERTENSION, BENIGN ESSENTIAL 10/23/2010  . CONSTIPATION 10/23/2010  . HEMATOCHEZIA 10/23/2010  . OSTEOARTHRITIS, HANDS, BILATERAL 01/24/2010  . Hypothyroidism 08/24/2008  . Vitamin D deficiency 06/06/2008  . Hyperlipidemia 06/06/2008  . MENOPAUSAL SYNDROME 03/29/2008  . Osteopenia 03/29/2008  . ALLERGIC RHINITIS 01/25/2008  . IBS 01/25/2008  . OSTEOARTHRITIS 01/25/2008  . FIBROMYALGIA 01/25/2008    Jearld Lesch., PT 08/31/2018, 1:38 PM  [facility] [address] [place], [place], [address] Phone: [phone]   Fax:  [phone]  Name: MIRIA FINNIE MRN: [id number] Date of Birth: [birth year]

## 2018-09-02 ENCOUNTER — Encounter: Payer: Self-pay | Admitting: Physical Therapy

## 2018-09-02 ENCOUNTER — Ambulatory Visit: Payer: Medicare Other | Admitting: Physical Therapy

## 2018-09-02 DIAGNOSIS — R262 Difficulty in walking, not elsewhere classified: Secondary | ICD-10-CM

## 2018-09-02 DIAGNOSIS — M62830 Muscle spasm of back: Secondary | ICD-10-CM

## 2018-09-02 DIAGNOSIS — M5441 Lumbago with sciatica, right side: Secondary | ICD-10-CM | POA: Diagnosis not present

## 2018-09-02 NOTE — Therapy (Signed)
07/31/2017  . Right low back pain 03/04/2017  . Arthropod bite 01/28/2017  . Atrial flutter ([place]) 03/06/2016  . Obesity 11/06/2015  . Urge incontinence 01/24/2015  . Estrogen deficiency 10/25/2014  . Lichen planus 93/79/0240  . Encounter for Medicare annual wellness exam 06/17/2013  . Screening mammogram, encounter for 06/15/2012  . Hyperglycemia 06/03/2011  . HYPERTENSION, BENIGN ESSENTIAL 10/23/2010  . CONSTIPATION 10/23/2010  . HEMATOCHEZIA 10/23/2010  . OSTEOARTHRITIS, HANDS, BILATERAL 01/24/2010  . Hypothyroidism 08/24/2008  . Vitamin D deficiency 06/06/2008  . Hyperlipidemia 06/06/2008  . MENOPAUSAL SYNDROME 03/29/2008  . Osteopenia 03/29/2008  . ALLERGIC RHINITIS 01/25/2008  . IBS 01/25/2008  . OSTEOARTHRITIS 01/25/2008  . FIBROMYALGIA 01/25/2008    Linda Mcgrath., PT 09/02/2018, 4:05 PM  Linda Mcgr[place], [place], [address] Phone: [phone]   Fax:  [phone]  Name: Linda Mcgrath MRN: [id number] Date of Birth: [birth year]  07/31/2017  . Right low back pain 03/04/2017  . Arthropod bite 01/28/2017  . Atrial flutter ([place]) 03/06/2016  . Obesity 11/06/2015  . Urge incontinence 01/24/2015  . Estrogen deficiency 10/25/2014  . Lichen planus 93/79/0240  . Encounter for Medicare annual wellness exam 06/17/2013  . Screening mammogram, encounter for 06/15/2012  . Hyperglycemia 06/03/2011  . HYPERTENSION, BENIGN ESSENTIAL 10/23/2010  . CONSTIPATION 10/23/2010  . HEMATOCHEZIA 10/23/2010  . OSTEOARTHRITIS, HANDS, BILATERAL 01/24/2010  . Hypothyroidism 08/24/2008  . Vitamin D deficiency 06/06/2008  . Hyperlipidemia 06/06/2008  . MENOPAUSAL SYNDROME 03/29/2008  . Osteopenia 03/29/2008  . ALLERGIC RHINITIS 01/25/2008  . IBS 01/25/2008  . OSTEOARTHRITIS 01/25/2008  . FIBROMYALGIA 01/25/2008    Linda Mcgrath., PT 09/02/2018, 4:05 PM  [place]inda Mcgr[place], [place], [address] Phone: [phone]   Fax:  [phone]  Name: Linda Mcgrath MRN: [id number] Date of Birth: [birth year]  Linda Mcgrath [place], [place], [address] Phone: [phone]   Fax:  [phone]  Physical Therapy Treatment  Patient Details  Name: Linda Mcgrath MRN: [id number] Date of Birth: 07/17/1933 Referring Provider (PT): [name]   Encounter Date: 09/02/2018  PT End of Session - 09/02/18 1602    Visit Number  26    Date for PT Re-Evaluation  09/21/18    PT Start Time  1430    PT Stop Time  1528    PT Time Calculation (min)  58 min    Activity Tolerance  Patient tolerated treatment well    Behavior During Therapy  [facility] for tasks assessed/performed       Past Medical History:  Diagnosis Date  . Allergic rhinitis   . Asthma   . Cataract    Bil/lens implant  . Colon polyp 1999   small polyp  . Diverticulosis   . Dysrhythmia   . Fibromyalgia   . Hyperlipidemia   . Hypothyroidism   . Internal hemorrhoids   . Leg cramps   . Lung nodule    stable LUL 9 mm  . Menopausal syndrome   . Osteoarthritis   . UTI (urinary tract infection)     Past Surgical History:  Procedure Laterality Date  . Abd U/S  11/1998   negative  . Abd U/S  01/2001   gallbladder polyps  . ABDOMINAL HYSTERECTOMY  1975   total-fibroids  . APPENDECTOMY  1975  . BREAST BIOPSY     benign  . BREAST EXCISIONAL BIOPSY Left 1957  . CARDIOVERSION N/A 03/07/2016   Procedure: CARDIOVERSION;  Surgeon: Adrian Prows, MD;  Location: [facility];  Service: Cardiovascular;  Laterality: N/A;  . CATARACT EXTRACTION     bilateral  . CHOLECYSTECTOMY    . COLONOSCOPY  1998   Diverticulosis; polyp\  . COLONOSCOPY  09/2002   Diverticulosis, hem  . COLONOSCOPY  12/2007   diverticulosis, polyp  . DEXA  10/2001   osteopenia  . ELECTROPHYSIOLOGIC STUDY N/A 04/03/2016   Procedure: A-Flutter Ablation;  Surgeon: Will Meredith Leeds, MD;  Location: [place] CV LAB;  Service: Cardiovascular;  Laterality: N/A;  . ESOPHAGOGASTRODUODENOSCOPY  2004  . Hida  scan  01/2001   Negative  . KNEE SURGERY     right knee / 11/2004  . LAMINECTOMY WITH POSTERIOR LATERAL ARTHRODESIS LEVEL 2 N/A 07/31/2017   Procedure: DECOMPRESSIVE LAMINECTOMY LUMABR FOUR-FIVE, LUMBAR FIVE-SACRAL ONE;  Surgeon: Eustace Moore, MD;  Location: [facility];  Service: Neurosurgery;  Laterality: N/A;  . laser surgery for glaucoma Left Sep 21, 2014  . ROTATOR CUFF REPAIR     x 2 /right shoulder  . SKIN CANCER EXCISION     pre-melanoma / on face  . TEE WITHOUT CARDIOVERSION N/A 03/07/2016   Procedure: TRANSESOPHAGEAL ECHOCARDIOGRAM (TEE);  Surgeon: Adrian Prows, MD;  Location: [place];  Service: Cardiovascular;  Laterality: N/A;  . TOE SURGERY     rt foot     There were no vitals filed for this visit.  Subjective Assessment - 09/02/18 1558    Subjective  Patient reports that she has been having some increase of knee pain after some of the exercises that we did.  She has had some ups and downs with the exercises and we have to be careful with them.  Overall she continues to reports that she is doing much more than she used to be able to do and is

## 2018-09-08 ENCOUNTER — Encounter: Payer: Self-pay | Admitting: Physical Therapy

## 2018-09-08 ENCOUNTER — Ambulatory Visit: Payer: Medicare Other | Admitting: Physical Therapy

## 2018-09-08 DIAGNOSIS — M62830 Muscle spasm of back: Secondary | ICD-10-CM

## 2018-09-08 DIAGNOSIS — R262 Difficulty in walking, not elsewhere classified: Secondary | ICD-10-CM

## 2018-09-08 DIAGNOSIS — M5441 Lumbago with sciatica, right side: Secondary | ICD-10-CM

## 2018-09-08 NOTE — Therapy (Signed)
[facility] [place], [place], [address] Phone: [phone]   Fax:  [phone]  Physical Therapy Treatment  Patient Details  Name: Linda Mcgrath MRN: [id number] Date of Birth: 1932-12-24 Referring Provider (PT): [name]   Encounter Date: 09/08/2018  PT End of Session - 09/08/18 1340    Visit Number  27    Date for PT Re-Evaluation  09/21/18    PT Start Time  1254    PT Stop Time  1356    PT Time Calculation (min)  62 min    Activity Tolerance  Patient tolerated treatment well    Behavior During Therapy  [facility] for tasks assessed/performed       Past Medical History:  Diagnosis Date  . Allergic rhinitis   . Asthma   . Cataract    Bil/lens implant  . Colon polyp 1999   small polyp  . Diverticulosis   . Dysrhythmia   . Fibromyalgia   . Hyperlipidemia   . Hypothyroidism   . Internal hemorrhoids   . Leg cramps   . Lung nodule    stable LUL 9 mm  . Menopausal syndrome   . Osteoarthritis   . UTI (urinary tract infection)     Past Surgical History:  Procedure Laterality Date  . Abd U/S  11/1998   negative  . Abd U/S  01/2001   gallbladder polyps  . ABDOMINAL HYSTERECTOMY  1975   total-fibroids  . APPENDECTOMY  1975  . BREAST BIOPSY     benign  . BREAST EXCISIONAL BIOPSY Left 1957  . CARDIOVERSION N/A 03/07/2016   Procedure: CARDIOVERSION;  Surgeon: Adrian Prows, MD;  Location: [place];  Service: Cardiovascular;  Laterality: N/A;  . CATARACT EXTRACTION     bilateral  . CHOLECYSTECTOMY    . COLONOSCOPY  1998   Diverticulosis; polyp\  . COLONOSCOPY  09/2002   Diverticulosis, hem  . COLONOSCOPY  12/2007   diverticulosis, polyp  . DEXA  10/2001   osteopenia  . ELECTROPHYSIOLOGIC STUDY N/A 04/03/2016   Procedure: A-Flutter Ablation;  Surgeon: Will Meredith Leeds, MD;  Location: [place] CV LAB;  Service: Cardiovascular;  Laterality: N/A;  . ESOPHAGOGASTRODUODENOSCOPY  2004  . Hida  scan  01/2001   Negative  . KNEE SURGERY     right knee / 11/2004  . LAMINECTOMY WITH POSTERIOR LATERAL ARTHRODESIS LEVEL 2 N/A 07/31/2017   Procedure: DECOMPRESSIVE LAMINECTOMY LUMABR FOUR-FIVE, LUMBAR FIVE-SACRAL ONE;  Surgeon: Eustace Moore, MD;  Location: [facility];  Service: Neurosurgery;  Laterality: N/A;  . laser surgery for glaucoma Left Sep 21, 2014  . ROTATOR CUFF REPAIR     x 2 /right shoulder  . SKIN CANCER EXCISION     pre-melanoma / on face  . TEE WITHOUT CARDIOVERSION N/A 03/07/2016   Procedure: TRANSESOPHAGEAL ECHOCARDIOGRAM (TEE);  Surgeon: Adrian Prows, MD;  Location: [facility];  Service: Cardiovascular;  Laterality: N/A;  . TOE SURGERY     rt foot     There were no vitals filed for this visit.  Subjective Assessment - 09/08/18 1307    Subjective  Patient reports on Friday she felt a "strange sensation in the back and the right leg" she felt like the leg gave way and she fell into the wall, "a stumble not a fall".  She reports that she has not had that sensation before or since    Currently in Pain?  Yes    Pain Score  [facility] [place], [place], [address] Phone: [phone]   Fax:  [phone]  Physical Therapy Treatment  Patient Details  Name: Linda Mcgrath MRN: [id number] Date of Birth: 1932-12-24 Referring Provider (PT): [name]   Encounter Date: 09/08/2018  PT End of Session - 09/08/18 1340    Visit Number  27    Date for PT Re-Evaluation  09/21/18    PT Start Time  1254    PT Stop Time  1356    PT Time Calculation (min)  62 min    Activity Tolerance  Patient tolerated treatment well    Behavior During Therapy  [facility] for tasks assessed/performed       Past Medical History:  Diagnosis Date  . Allergic rhinitis   . Asthma   . Cataract    Bil/lens implant  . Colon polyp 1999   small polyp  . Diverticulosis   . Dysrhythmia   . Fibromyalgia   . Hyperlipidemia   . Hypothyroidism   . Internal hemorrhoids   . Leg cramps   . Lung nodule    stable LUL 9 mm  . Menopausal syndrome   . Osteoarthritis   . UTI (urinary tract infection)     Past Surgical History:  Procedure Laterality Date  . Abd U/S  11/1998   negative  . Abd U/S  01/2001   gallbladder polyps  . ABDOMINAL HYSTERECTOMY  1975   total-fibroids  . APPENDECTOMY  1975  . BREAST BIOPSY     benign  . BREAST EXCISIONAL BIOPSY Left 1957  . CARDIOVERSION N/A 03/07/2016   Procedure: CARDIOVERSION;  Surgeon: Adrian Prows, MD;  Location: [place];  Service: Cardiovascular;  Laterality: N/A;  . CATARACT EXTRACTION     bilateral  . CHOLECYSTECTOMY    . COLONOSCOPY  1998   Diverticulosis; polyp\  . COLONOSCOPY  09/2002   Diverticulosis, hem  . COLONOSCOPY  12/2007   diverticulosis, polyp  . DEXA  10/2001   osteopenia  . ELECTROPHYSIOLOGIC STUDY N/A 04/03/2016   Procedure: A-Flutter Ablation;  Surgeon: Will Meredith Leeds, MD;  Location: [place] CV LAB;  Service: Cardiovascular;  Laterality: N/A;  . ESOPHAGOGASTRODUODENOSCOPY  2004  . Hida  scan  01/2001   Negative  . KNEE SURGERY     right knee / 11/2004  . LAMINECTOMY WITH POSTERIOR LATERAL ARTHRODESIS LEVEL 2 N/A 07/31/2017   Procedure: DECOMPRESSIVE LAMINECTOMY LUMABR FOUR-FIVE, LUMBAR FIVE-SACRAL ONE;  Surgeon: Eustace Moore, MD;  Location: [place];  Service: Neurosurgery;  Laterality: N/A;  . laser surgery for glaucoma Left Sep 21, 2014  . ROTATOR CUFF REPAIR     x 2 /right shoulder  . SKIN CANCER EXCISION     pre-melanoma / on face  . TEE WITHOUT CARDIOVERSION N/A 03/07/2016   Procedure: TRANSESOPHAGEAL ECHOCARDIOGRAM (TEE);  Surgeon: Adrian Prows, MD;  Location: [place];  Service: Cardiovascular;  Laterality: N/A;  . TOE SURGERY     rt foot     There were no vitals filed for this visit.  Subjective Assessment - 09/08/18 1307    Subjective  Patient reports on Friday she felt a "strange sensation in the back and the right leg" she felt like the leg gave way and she fell into the wall, "a stumble not a fall".  She reports that she has not had that sensation before or since    Currently in Pain?  Yes    Pain Score  03/04/2017  . Arthropod bite 01/28/2017  . Atrial flutter ([place]) 03/06/2016  . Obesity 11/06/2015  . Urge incontinence 01/24/2015  . Estrogen deficiency 10/25/2014  . Lichen planus 82/51/8984  . Encounter for Medicare annual wellness exam 06/17/2013  . Screening mammogram, encounter for 06/15/2012  . Hyperglycemia 06/03/2011  . HYPERTENSION, BENIGN ESSENTIAL 10/23/2010  . CONSTIPATION 10/23/2010  . HEMATOCHEZIA 10/23/2010  . OSTEOARTHRITIS, HANDS, BILATERAL 01/24/2010  . Hypothyroidism 08/24/2008  . Vitamin D deficiency 06/06/2008  . Hyperlipidemia 06/06/2008  . MENOPAUSAL SYNDROME 03/29/2008  . Osteopenia 03/29/2008  . ALLERGIC RHINITIS 01/25/2008  . IBS 01/25/2008  . OSTEOARTHRITIS 01/25/2008  . FIBROMYALGIA 01/25/2008    [name], PT 09/08/2018, 1:45 PM  [place] [address] [place], [place], [address] Phone: [phone]   Fax:  [phone]  Name: TIARRAH SAVILLE MRN: [id number] Date of Birth: 10-10-1932

## 2018-09-10 ENCOUNTER — Ambulatory Visit: Payer: Medicare Other | Admitting: Physical Therapy

## 2018-09-10 ENCOUNTER — Encounter: Payer: Self-pay | Admitting: Physical Therapy

## 2018-09-10 DIAGNOSIS — R262 Difficulty in walking, not elsewhere classified: Secondary | ICD-10-CM

## 2018-09-10 DIAGNOSIS — M62830 Muscle spasm of back: Secondary | ICD-10-CM | POA: Diagnosis not present

## 2018-09-10 DIAGNOSIS — M5441 Lumbago with sciatica, right side: Secondary | ICD-10-CM | POA: Diagnosis not present

## 2018-09-10 NOTE — Therapy (Signed)
or limited  pain with functional activities    Consulted and Agree with Plan of Care  Patient       Patient will benefit from skilled therapeutic intervention in order to improve the following deficits and impairments:  Decreased activity tolerance, Decreased mobility, Decreased endurance, Cardiopulmonary status limiting activity, Decreased range of motion, Decreased strength, Postural dysfunction, Improper body mechanics, Impaired flexibility, Pain, Increased muscle spasms, Difficulty walking  Visit Diagnosis: Acute right-sided low back pain with right-sided sciatica  Muscle spasm of back  Difficulty in walking, not elsewhere classified     Problem List Patient Active Problem List   Diagnosis Date Noted  . Diarrhea 07/03/2018  . Dyspepsia 07/03/2018  . S/P lumbar spinal fusion 07/31/2017  . Right low back pain 03/04/2017  . Arthropod bite 01/28/2017  . Atrial flutter ([place]) 03/06/2016  . Obesity 11/06/2015  . Urge incontinence 01/24/2015  . Estrogen deficiency 10/25/2014  . Lichen planus 01/75/1025  . Encounter for Medicare annual wellness exam 06/17/2013  . Screening mammogram, encounter for 06/15/2012  . Hyperglycemia 06/03/2011  . HYPERTENSION, BENIGN ESSENTIAL 10/23/2010  . CONSTIPATION 10/23/2010  . HEMATOCHEZIA 10/23/2010  . OSTEOARTHRITIS, HANDS, BILATERAL 01/24/2010  . Hypothyroidism 08/24/2008  . Vitamin D deficiency 06/06/2008  . Hyperlipidemia 06/06/2008  . MENOPAUSAL SYNDROME 03/29/2008  . Osteopenia 03/29/2008  . ALLERGIC RHINITIS 01/25/2008  . IBS 01/25/2008  . OSTEOARTHRITIS 01/25/2008  . FIBROMYALGIA 01/25/2008    [name], PT 09/10/2018, 1:55 PM  [address] [place], [place], [address] Phone: [phone]   Fax:  [phone]  Name: Linda Mcgrath MRN: [id number] Date of Birth: [birth year]  [address] [place], [place], [address] Phone: [phone]   Fax:  [phone]  Physical Therapy Treatment  Patient Details  Name: Linda Mcgrath MRN: [id number] Date of Birth: [birth year] Referring Provider (PT): [name]   Encounter Date: 09/10/2018  PT End of Session - 09/10/18 1349    Visit Number  28    Date for PT Re-Evaluation  09/21/18    PT Start Time  1300    PT Stop Time  1351    PT Time Calculation (min)  51 min    Activity Tolerance  Patient tolerated treatment well    Behavior During Therapy  [facility] for tasks assessed/performed       Past Medical History:  Diagnosis Date  . Allergic rhinitis   . Asthma   . Cataract    Bil/lens implant  . Colon polyp 1999   small polyp  . Diverticulosis   . Dysrhythmia   . Fibromyalgia   . Hyperlipidemia   . Hypothyroidism   . Internal hemorrhoids   . Leg cramps   . Lung nodule    stable LUL 9 mm  . Menopausal syndrome   . Osteoarthritis   . UTI (urinary tract infection)     Past Surgical History:  Procedure Laterality Date  . Abd U/S  11/1998   negative  . Abd U/S  01/2001   gallbladder polyps  . ABDOMINAL HYSTERECTOMY  1975   total-fibroids  . APPENDECTOMY  1975  . BREAST BIOPSY     benign  . BREAST EXCISIONAL BIOPSY Left 1957  . CARDIOVERSION N/A 03/07/2016   Procedure: CARDIOVERSION;  Surgeon: Adrian Prows, MD;  Location: [place];  Service: Cardiovascular;  Laterality: N/A;  . CATARACT EXTRACTION     bilateral  . CHOLECYSTECTOMY    . COLONOSCOPY  1998   Diverticulosis; polyp\  . COLONOSCOPY  09/2002   Diverticulosis, hem  . COLONOSCOPY  12/2007   diverticulosis, polyp  . DEXA  10/2001   osteopenia  . ELECTROPHYSIOLOGIC STUDY N/A 04/03/2016   Procedure: A-Flutter Ablation;  Surgeon: Will Meredith Leeds, MD;  Location: [place] [facility];  Service: Cardiovascular;  Laterality: N/A;  . ESOPHAGOGASTRODUODENOSCOPY  2004  . [place]  scan  01/2001   Negative  . KNEE SURGERY     right knee / 11/2004  . LAMINECTOMY WITH POSTERIOR LATERAL ARTHRODESIS LEVEL 2 N/A 07/31/2017   Procedure: DECOMPRESSIVE LAMINECTOMY LUMABR FOUR-FIVE, LUMBAR FIVE-SACRAL ONE;  Surgeon: Eustace Moore, MD;  Location: [place];  Service: Neurosurgery;  Laterality: N/A;  . laser surgery for glaucoma Left Sep 21, 2014  . ROTATOR CUFF REPAIR     x 2 /right shoulder  . SKIN CANCER EXCISION     pre-melanoma / on face  . TEE WITHOUT CARDIOVERSION N/A 03/07/2016   Procedure: TRANSESOPHAGEAL ECHOCARDIOGRAM (TEE);  Surgeon: Adrian Prows, MD;  Location: [facility];  Service: Cardiovascular;  Laterality: N/A;  . TOE SURGERY     rt foot     There were no vitals filed for this visit.  Subjective Assessment - 09/10/18 1345    Subjective  Patient reports that she feels much better with the treatment that we did last time.      Currently in Pain?  Yes    Pain Score  2     Pain Location  Back    Pain Orientation  Right;Lower    Pain Relieving Factors  Much better with the treatment "you did"  or limited  pain with functional activities    Consulted and Agree with Plan of Care  Patient       Patient will benefit from skilled therapeutic intervention in order to improve the following deficits and impairments:  Decreased activity tolerance, Decreased mobility, Decreased endurance, Cardiopulmonary status limiting activity, Decreased range of motion, Decreased strength, Postural dysfunction, Improper body mechanics, Impaired flexibility, Pain, Increased muscle spasms, Difficulty walking  Visit Diagnosis: Acute right-sided low back pain with right-sided sciatica  Muscle spasm of back  Difficulty in walking, not elsewhere classified     Problem List Patient Active Problem List   Diagnosis Date Noted  . Diarrhea 07/03/2018  . Dyspepsia 07/03/2018  . S/P lumbar spinal fusion 07/31/2017  . Right low back pain 03/04/2017  . Arthropod bite 01/28/2017  . Atrial flutter ([place]) 03/06/2016  . Obesity 11/06/2015  . Urge incontinence 01/24/2015  . Estrogen deficiency 10/25/2014  . Lichen planus 01/75/1025  . Encounter for Medicare annual wellness exam 06/17/2013  . Screening mammogram, encounter for 06/15/2012  . Hyperglycemia 06/03/2011  . HYPERTENSION, BENIGN ESSENTIAL 10/23/2010  . CONSTIPATION 10/23/2010  . HEMATOCHEZIA 10/23/2010  . OSTEOARTHRITIS, HANDS, BILATERAL 01/24/2010  . Hypothyroidism 08/24/2008  . Vitamin D deficiency 06/06/2008  . Hyperlipidemia 06/06/2008  . MENOPAUSAL SYNDROME 03/29/2008  . Osteopenia 03/29/2008  . ALLERGIC RHINITIS 01/25/2008  . IBS 01/25/2008  . OSTEOARTHRITIS 01/25/2008  . FIBROMYALGIA 01/25/2008    [name], PT 09/10/2018, 1:55 PM  [address] [place], [place], [address] Phone: [phone]   Fax:  [phone]  Name: Linda Mcgrath MRN: [id number] Date of Birth: [birth year]

## 2018-09-11 ENCOUNTER — Telehealth: Payer: Self-pay | Admitting: Family Medicine

## 2018-09-11 NOTE — Telephone Encounter (Signed)
Pt dropped off handicap form to be filled out  Please mail to pt Form in dr towers rx tower up front

## 2018-09-14 NOTE — Telephone Encounter (Signed)
Form mailed back to pt and copy of form sent to scanning

## 2018-09-14 NOTE — Telephone Encounter (Signed)
Done and in IN box 

## 2018-09-15 ENCOUNTER — Ambulatory Visit: Payer: Medicare Other | Admitting: Physical Therapy

## 2018-09-15 ENCOUNTER — Encounter: Payer: Self-pay | Admitting: Physical Therapy

## 2018-09-15 DIAGNOSIS — R262 Difficulty in walking, not elsewhere classified: Secondary | ICD-10-CM | POA: Diagnosis not present

## 2018-09-15 DIAGNOSIS — M5441 Lumbago with sciatica, right side: Secondary | ICD-10-CM | POA: Diagnosis not present

## 2018-09-15 DIAGNOSIS — M62830 Muscle spasm of back: Secondary | ICD-10-CM | POA: Diagnosis not present

## 2018-09-15 NOTE — Therapy (Signed)
mammogram, encounter for 06/15/2012  . Hyperglycemia 06/03/2011  . HYPERTENSION, BENIGN ESSENTIAL 10/23/2010  . CONSTIPATION 10/23/2010  . HEMATOCHEZIA 10/23/2010  . OSTEOARTHRITIS, HANDS, BILATERAL 01/24/2010  . Hypothyroidism 08/24/2008  . Vitamin D deficiency 06/06/2008  . Hyperlipidemia 06/06/2008  . MENOPAUSAL SYNDROME 03/29/2008  . Osteopenia 03/29/2008  . ALLERGIC RHINITIS 01/25/2008  . IBS 01/25/2008  . OSTEOARTHRITIS 01/25/2008  . FIBROMYALGIA 01/25/2008    [name], PT 09/15/2018, 1:26 PM  [address] [place], [place], [address] Phone: [phone]   Fax:  [phone]  Name: Linda Mcgrath MRN: [id number] Date of Birth: [birth year]  mammogram, encounter for 06/15/2012  . Hyperglycemia 06/03/2011  . HYPERTENSION, BENIGN ESSENTIAL 10/23/2010  . CONSTIPATION 10/23/2010  . HEMATOCHEZIA 10/23/2010  . OSTEOARTHRITIS, HANDS, BILATERAL 01/24/2010  . Hypothyroidism 08/24/2008  . Vitamin D deficiency 06/06/2008  . Hyperlipidemia 06/06/2008  . MENOPAUSAL SYNDROME 03/29/2008  . Osteopenia 03/29/2008  . ALLERGIC RHINITIS 01/25/2008  . IBS 01/25/2008  . OSTEOARTHRITIS 01/25/2008  . FIBROMYALGIA 01/25/2008    [name], PT 09/15/2018, 1:26 PM  [address] [place], [place], [address] Phone: [phone]   Fax:  [phone]  Name: Linda Mcgrath MRN: [id number] Date of Birth: [birth year]  [address] [place], [place], [address] Phone: [phone]   Fax:  [phone]  Physical Therapy Treatment  Patient Details  Name: Linda Mcgrath MRN: [id number] Date of Birth: [birth year] Referring Provider (PT): [name]   Encounter Date: 09/15/2018  PT End of Session - 09/15/18 1324    Visit Number  29    Date for PT Re-Evaluation  09/21/18    PT Start Time  1257    PT Stop Time  1345    PT Time Calculation (min)  48 min    Activity Tolerance  Patient tolerated treatment well    Behavior During Therapy  [facility] for tasks assessed/performed       Past Medical History:  Diagnosis Date  . Allergic rhinitis   . Asthma   . Cataract    Bil/lens implant  . Colon polyp 1999   small polyp  . Diverticulosis   . Dysrhythmia   . Fibromyalgia   . Hyperlipidemia   . Hypothyroidism   . Internal hemorrhoids   . Leg cramps   . Lung nodule    stable LUL 9 mm  . Menopausal syndrome   . Osteoarthritis   . UTI (urinary tract infection)     Past Surgical History:  Procedure Laterality Date  . Abd U/S  11/1998   negative  . Abd U/S  01/2001   gallbladder polyps  . ABDOMINAL HYSTERECTOMY  1975   total-fibroids  . APPENDECTOMY  1975  . BREAST BIOPSY     benign  . BREAST EXCISIONAL BIOPSY Left 1957  . CARDIOVERSION N/A 03/07/2016   Procedure: CARDIOVERSION;  Surgeon: Adrian Prows, MD;  Location: [place];  Service: Cardiovascular;  Laterality: N/A;  . CATARACT EXTRACTION     bilateral  . CHOLECYSTECTOMY    . COLONOSCOPY  1998   Diverticulosis; polyp\  . COLONOSCOPY  09/2002   Diverticulosis, hem  . COLONOSCOPY  12/2007   diverticulosis, polyp  . DEXA  10/2001   osteopenia  . ELECTROPHYSIOLOGIC STUDY N/A 04/03/2016   Procedure: A-Flutter Ablation;  Surgeon: Will Meredith Leeds, MD;  Location: [place] [facility];  Service: Cardiovascular;  Laterality: N/A;  . ESOPHAGOGASTRODUODENOSCOPY  2004  . [place]  scan  01/2001   Negative  . KNEE SURGERY     right knee / 11/2004  . LAMINECTOMY WITH POSTERIOR LATERAL ARTHRODESIS LEVEL 2 N/A 07/31/2017   Procedure: DECOMPRESSIVE LAMINECTOMY LUMABR FOUR-FIVE, LUMBAR FIVE-SACRAL ONE;  Surgeon: Eustace Moore, MD;  Location: [place];  Service: Neurosurgery;  Laterality: N/A;  . laser surgery for glaucoma Left Sep 21, 2014  . ROTATOR CUFF REPAIR     x 2 /right shoulder  . SKIN CANCER EXCISION     pre-melanoma / on face  . TEE WITHOUT CARDIOVERSION N/A 03/07/2016   Procedure: TRANSESOPHAGEAL ECHOCARDIOGRAM (TEE);  Surgeon: Adrian Prows, MD;  Location: [facility];  Service: Cardiovascular;  Laterality: N/A;  . TOE SURGERY     rt foot     There were no vitals filed for this visit.  Subjective Assessment - 09/15/18 1321    Subjective  Patient comes in and reports that her son passed away at midnight, reports no sleep, I asked if she needed to cancel and she reports no "I really need this"  Reports some increased pain    Currently in Pain?  Yes    Pain Score  4     Pain Location  Back

## 2018-09-17 ENCOUNTER — Encounter: Payer: Self-pay | Admitting: Physical Therapy

## 2018-09-17 ENCOUNTER — Ambulatory Visit: Payer: Medicare Other | Admitting: Physical Therapy

## 2018-09-17 DIAGNOSIS — R262 Difficulty in walking, not elsewhere classified: Secondary | ICD-10-CM | POA: Diagnosis not present

## 2018-09-17 DIAGNOSIS — M5441 Lumbago with sciatica, right side: Secondary | ICD-10-CM | POA: Diagnosis not present

## 2018-09-17 DIAGNOSIS — M62830 Muscle spasm of back: Secondary | ICD-10-CM

## 2018-09-17 NOTE — Therapy (Signed)
Right low back pain 03/04/2017  . Arthropod bite 01/28/2017  . Atrial flutter ([place]) 03/06/2016  . Obesity 11/06/2015  . Urge incontinence 01/24/2015  . Estrogen deficiency 10/25/2014  . Lichen planus 25/85/2778  . Encounter for Medicare annual wellness exam 06/17/2013  . Screening mammogram, encounter for 06/15/2012  . Hyperglycemia 06/03/2011  . HYPERTENSION, BENIGN ESSENTIAL 10/23/2010  . CONSTIPATION 10/23/2010  . HEMATOCHEZIA 10/23/2010  . OSTEOARTHRITIS, HANDS, BILATERAL 01/24/2010  . Hypothyroidism 08/24/2008  . Vitamin D deficiency 06/06/2008  . Hyperlipidemia 06/06/2008  . MENOPAUSAL SYNDROME 03/29/2008  . Osteopenia 03/29/2008  . ALLERGIC RHINITIS 01/25/2008  . IBS 01/25/2008  . OSTEOARTHRITIS 01/25/2008  . FIBROMYALGIA 01/25/2008    [name], PT 09/17/2018, 11:54 AM  [name] [place], [place], [address] Phone: [phone]   Fax:  [phone]  Name: Linda Mcgrath MRN: [id number] Date of Birth: 11-30-1932  [name], [place], [address] Phone: [phone]   Fax:  [phone]  Physical Therapy Treatment  Patient Details  Name: Linda Mcgrath MRN: [id number] Date of Birth: 1933-07-22 Referring Provider (PT): [name]   Encounter Date: 09/17/2018  PT End of Session - 09/17/18 1148    Visit Number  30    Date for PT Re-Evaluation  09/21/18    PT Start Time  1000    PT Stop Time  1105    PT Time Calculation (min)  65 min    Activity Tolerance  Patient tolerated treatment well    Behavior During Therapy  [facility] for tasks assessed/performed       Past Medical History:  Diagnosis Date  . Allergic rhinitis   . Asthma   . Cataract    Bil/lens implant  . Colon polyp 1999   small polyp  . Diverticulosis   . Dysrhythmia   . Fibromyalgia   . Hyperlipidemia   . Hypothyroidism   . Internal hemorrhoids   . Leg cramps   . Lung nodule    stable LUL 9 mm  . Menopausal syndrome   . Osteoarthritis   . UTI (urinary tract infection)     Past Surgical History:  Procedure Laterality Date  . Abd U/S  11/1998   negative  . Abd U/S  01/2001   gallbladder polyps  . ABDOMINAL HYSTERECTOMY  1975   total-fibroids  . APPENDECTOMY  1975  . BREAST BIOPSY     benign  . BREAST EXCISIONAL BIOPSY Left 1957  . CARDIOVERSION N/A 03/07/2016   Procedure: CARDIOVERSION;  Surgeon: Adrian Prows, MD;  Location: [place];  Service: Cardiovascular;  Laterality: N/A;  . CATARACT EXTRACTION     bilateral  . CHOLECYSTECTOMY    . COLONOSCOPY  1998   Diverticulosis; polyp\  . COLONOSCOPY  09/2002   Diverticulosis, hem  . COLONOSCOPY  12/2007   diverticulosis, polyp  . DEXA  10/2001   osteopenia  . ELECTROPHYSIOLOGIC STUDY N/A 04/03/2016   Procedure: A-Flutter Ablation;  Surgeon: Will Meredith Leeds, MD;  Location: [place] [facility];  Service: Cardiovascular;  Laterality: N/A;  . ESOPHAGOGASTRODUODENOSCOPY  2004  . [place]  scan  01/2001   Negative  . KNEE SURGERY     right knee / 11/2004  . LAMINECTOMY WITH POSTERIOR LATERAL ARTHRODESIS LEVEL 2 N/A 07/31/2017   Procedure: DECOMPRESSIVE LAMINECTOMY LUMABR FOUR-FIVE, LUMBAR FIVE-SACRAL ONE;  Surgeon: Eustace Moore, MD;  Location: [place];  Service: Neurosurgery;  Laterality: N/A;  . laser surgery for glaucoma Left Sep 21, 2014  . ROTATOR CUFF REPAIR     x 2 /right shoulder  . SKIN CANCER EXCISION     pre-melanoma / on face  . TEE WITHOUT CARDIOVERSION N/A 03/07/2016   Procedure: TRANSESOPHAGEAL ECHOCARDIOGRAM (TEE);  Surgeon: Adrian Prows, MD;  Location: [place];  Service: Cardiovascular;  Laterality: N/A;  . TOE SURGERY     rt foot     There were no vitals filed for this visit.  Subjective Assessment - 09/17/18 1144    Subjective  Patietn seems to be in a little better spirit and looks better, reports that she got some sleep last night.  Reports that she has been [place] and from the funeral hom and various places and the back is stiff and tight    Currently in Pain?  Yes    Pain Score  5  [name], [place], [address] Phone: [phone]   Fax:  [phone]  Physical Therapy Treatment  Patient Details  Name: Linda Mcgrath MRN: [id number] Date of Birth: 1933-07-22 Referring Provider (PT): [name]   Encounter Date: 09/17/2018  PT End of Session - 09/17/18 1148    Visit Number  30    Date for PT Re-Evaluation  09/21/18    PT Start Time  1000    PT Stop Time  1105    PT Time Calculation (min)  65 min    Activity Tolerance  Patient tolerated treatment well    Behavior During Therapy  [facility] for tasks assessed/performed       Past Medical History:  Diagnosis Date  . Allergic rhinitis   . Asthma   . Cataract    Bil/lens implant  . Colon polyp 1999   small polyp  . Diverticulosis   . Dysrhythmia   . Fibromyalgia   . Hyperlipidemia   . Hypothyroidism   . Internal hemorrhoids   . Leg cramps   . Lung nodule    stable LUL 9 mm  . Menopausal syndrome   . Osteoarthritis   . UTI (urinary tract infection)     Past Surgical History:  Procedure Laterality Date  . Abd U/S  11/1998   negative  . Abd U/S  01/2001   gallbladder polyps  . ABDOMINAL HYSTERECTOMY  1975   total-fibroids  . APPENDECTOMY  1975  . BREAST BIOPSY     benign  . BREAST EXCISIONAL BIOPSY Left 1957  . CARDIOVERSION N/A 03/07/2016   Procedure: CARDIOVERSION;  Surgeon: Adrian Prows, MD;  Location: [facility];  Service: Cardiovascular;  Laterality: N/A;  . CATARACT EXTRACTION     bilateral  . CHOLECYSTECTOMY    . COLONOSCOPY  1998   Diverticulosis; polyp\  . COLONOSCOPY  09/2002   Diverticulosis, hem  . COLONOSCOPY  12/2007   diverticulosis, polyp  . DEXA  10/2001   osteopenia  . ELECTROPHYSIOLOGIC STUDY N/A 04/03/2016   Procedure: A-Flutter Ablation;  Surgeon: Will Meredith Leeds, MD;  Location: [place] [facility];  Service: Cardiovascular;  Laterality: N/A;  . ESOPHAGOGASTRODUODENOSCOPY  2004  . [place]  scan  01/2001   Negative  . KNEE SURGERY     right knee / 11/2004  . LAMINECTOMY WITH POSTERIOR LATERAL ARTHRODESIS LEVEL 2 N/A 07/31/2017   Procedure: DECOMPRESSIVE LAMINECTOMY LUMABR FOUR-FIVE, LUMBAR FIVE-SACRAL ONE;  Surgeon: Eustace Moore, MD;  Location: [place];  Service: Neurosurgery;  Laterality: N/A;  . laser surgery for glaucoma Left Sep 21, 2014  . ROTATOR CUFF REPAIR     x 2 /right shoulder  . SKIN CANCER EXCISION     pre-melanoma / on face  . TEE WITHOUT CARDIOVERSION N/A 03/07/2016   Procedure: TRANSESOPHAGEAL ECHOCARDIOGRAM (TEE);  Surgeon: Adrian Prows, MD;  Location: [place];  Service: Cardiovascular;  Laterality: N/A;  . TOE SURGERY     rt foot     There were no vitals filed for this visit.  Subjective Assessment - 09/17/18 1144    Subjective  Patietn seems to be in a little better spirit and looks better, reports that she got some sleep last night.  Reports that she has been to and from the funeral hom and various places and the back is stiff and tight    Currently in Pain?  Yes    Pain Score  5

## 2018-09-21 ENCOUNTER — Ambulatory Visit (INDEPENDENT_AMBULATORY_CARE_PROVIDER_SITE_OTHER): Payer: Medicare Other | Admitting: Family Medicine

## 2018-09-21 ENCOUNTER — Ambulatory Visit: Payer: Medicare Other | Admitting: Physical Therapy

## 2018-09-21 ENCOUNTER — Encounter: Payer: Self-pay | Admitting: Family Medicine

## 2018-09-21 ENCOUNTER — Encounter: Payer: Self-pay | Admitting: Physical Therapy

## 2018-09-21 VITALS — BP 138/70 | HR 80 | Temp 97.7°F | Resp 12 | Ht 63.0 in | Wt 180.5 lb

## 2018-09-21 DIAGNOSIS — Z8679 Personal history of other diseases of the circulatory system: Secondary | ICD-10-CM

## 2018-09-21 DIAGNOSIS — R262 Difficulty in walking, not elsewhere classified: Secondary | ICD-10-CM | POA: Diagnosis not present

## 2018-09-21 DIAGNOSIS — J209 Acute bronchitis, unspecified: Secondary | ICD-10-CM | POA: Diagnosis not present

## 2018-09-21 DIAGNOSIS — M5441 Lumbago with sciatica, right side: Secondary | ICD-10-CM

## 2018-09-21 DIAGNOSIS — M62830 Muscle spasm of back: Secondary | ICD-10-CM | POA: Diagnosis not present

## 2018-09-21 MED ORDER — AZITHROMYCIN 250 MG PO TABS: ORAL_TABLET | ORAL | 0 refills | Status: DC

## 2018-09-21 MED ORDER — BENZONATATE 200 MG PO CAPS: 200.0000 mg | ORAL_CAPSULE | Freq: Three times a day (TID) | ORAL | 1 refills | Status: DC | PRN

## 2018-09-21 NOTE — Assessment & Plan Note (Signed)
S/p uri 2 weeks  Cough is persistent  Reassuring exam  In light of length of illness- cover with zpack  Tessalon for addn cough control  Fluids/rest  Update if not starting to improve in a week or if worsening

## 2018-09-21 NOTE — Progress Notes (Signed)
Subjective:    Patient ID: Linda Mcgrath, female    DOB: 19-Sep-1932, 85 y.o.   MRN: [id number]  HPI  Here for productive cough with chest and nasal congestion  Symptoms started about 2 weeks ago with dry cough  Then it became productive  ST -worse with cough - very hoarse  Phlegm and nasal mucous with color (lighter than it was)   No sinus pain  Some dull headache   No fever    Otc: flonase, mucinex for flu and cold)  Cough medicine -Zarbee's homeopathic (has honey) Hot tea   Son died suddenly on 16-Sep-2022  He was a kidney transplant pt  Had flash CHF   She is going to PT and getting better Still sore  In back  Can stand up straight now   Patient Active Problem List   Diagnosis Date Noted  . Acute bronchitis 09/21/2018  . Diarrhea 07/03/2018  . Dyspepsia 07/03/2018  . S/P lumbar spinal fusion 07/31/2017  . Right low back pain 03/04/2017  . Arthropod bite 01/28/2017  . H/O atrial flutter 03/06/2016  . Obesity 11/06/2015  . Urge incontinence 01/24/2015  . Estrogen deficiency 10/25/2014  . Lichen planus 12/20/2013  . Encounter for Medicare annual wellness exam 06/17/2013  . Screening mammogram, encounter for 06/15/2012  . Hyperglycemia 06/03/2011  . HYPERTENSION, BENIGN ESSENTIAL 10/23/2010  . CONSTIPATION 10/23/2010  . HEMATOCHEZIA 10/23/2010  . OSTEOARTHRITIS, HANDS, BILATERAL 01/24/2010  . Hypothyroidism 08/24/2008  . Vitamin D deficiency 06/06/2008  . Hyperlipidemia 06/06/2008  . MENOPAUSAL SYNDROME 03/29/2008  . Osteopenia 03/29/2008  . ALLERGIC RHINITIS 01/25/2008  . IBS 01/25/2008  . OSTEOARTHRITIS 01/25/2008  . FIBROMYALGIA 01/25/2008   Past Medical History:  Diagnosis Date  . Allergic rhinitis   . Asthma   . Cataract    Bil/lens implant  . Colon polyp 1999   small polyp  . Diverticulosis   . Dysrhythmia   . Fibromyalgia   . Hyperlipidemia   . Hypothyroidism   . Internal hemorrhoids   . Leg cramps   . Lung nodule    stable LUL 9  mm  . Menopausal syndrome   . Osteoarthritis   . UTI (urinary tract infection)    Past Surgical History:  Procedure Laterality Date  . Abd U/S  11/1998   negative  . Abd U/S  01/2001   gallbladder polyps  . ABDOMINAL HYSTERECTOMY  1975   total-fibroids  . APPENDECTOMY  1975  . BREAST BIOPSY     benign  . BREAST EXCISIONAL BIOPSY Left 1957  . CARDIOVERSION N/A 03/07/2016   Procedure: CARDIOVERSION;  Surgeon: Yates Decamp, MD;  Location: [facility] ENDOSCOPY;  Service: Cardiovascular;  Laterality: N/A;  . CATARACT EXTRACTION     bilateral  . CHOLECYSTECTOMY    . COLONOSCOPY  1998   Diverticulosis; polyp\  . COLONOSCOPY  09/2002   Diverticulosis, hem  . COLONOSCOPY  12/2007   diverticulosis, polyp  . DEXA  10/2001   osteopenia  . ELECTROPHYSIOLOGIC STUDY N/A 04/03/2016   Procedure: A-Flutter Ablation;  Surgeon: Will [name], MD;  Location: [facility] INVASIVE CV LAB;  Service: Cardiovascular;  Laterality: N/A;  . ESOPHAGOGASTRODUODENOSCOPY  2004  . Hida scan  01/2001   Negative  . KNEE SURGERY     right knee / 11/2004  . LAMINECTOMY WITH POSTERIOR LATERAL ARTHRODESIS LEVEL 2 N/A 07/31/2017   Procedure: DECOMPRESSIVE LAMINECTOMY LUMABR FOUR-FIVE, LUMBAR FIVE-SACRAL ONE;  Surgeon: Tia Alert, MD;  Location[facility] OR;  Service: Neurosurgery;  Laterality: N/A;  . laser surgery for glaucoma Left Sep 21, 2014  . ROTATOR CUFF REPAIR     x 2 /right shoulder  . SKIN CANCER EXCISION     pre-melanoma / on face  . TEE WITHOUT CARDIOVERSION N/A 03/07/2016   Procedure: TRANSESOPHAGEAL ECHOCARDIOGRAM (TEE);  Surgeon: Yates Decamp, MD;  Location: [facility] ENDOSCOPY;  Service: Cardiovascular;  Laterality: N/A;  . TOE SURGERY     rt foot    Social History   Tobacco Use  . Smoking status: Never Smoker  . Smokeless tobacco: Never Used  Substance Use Topics  . Alcohol use: No    Alcohol/week: 0.0 standard drinks    Comment: occasional-rare  . Drug use: No   Family History  Problem Relation Age of Onset  .  Parkinsonism Father   . Colon cancer Brother   . Allergies Mother   . Heart disease Mother   . Kidney failure Son        congenital   Allergies  Allergen Reactions  . Shellfish Allergy Anaphylaxis and Nausea And Vomiting  . Tetracycline Hives, Nausea And Vomiting, Rash and Other (See Comments)    SYSTEMIC REACTION: heart palpitations,muscle spasms, vomiting  . Amlodipine Besylate Other (See Comments)    REACTION: muscle spasms, extremity swelling, low bp  . Ciprofloxacin Other (See Comments)    REACTION: muscle spasms, insomnia  . Codeine Other (See Comments)    REACTION: hallucinations  . Propoxyphene Hcl Other (See Comments)    Unknown  . Sulfamethoxazole-Trimethoprim Nausea Only and Other (See Comments)    REACTION: dizzy, could not focus eyes and could not concentrate and nausea  . Tape Other (See Comments)    BANDAIDS TAKE OFF HER SKIN; PLEASE USE COBAN OR PAPER   . Xarelto [Rivaroxaban] Other (See Comments)    Aches and pains and nervousness  . Amoxicillin Rash  . 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 Penicillins Swelling, Rash and Other (See Comments)    Has patient had a PCN reaction causing immediate rash, facial/tongue/throat swelling, SOB or lightheadedness with hypotension: Yes Has patient had a PCN reaction causing severe rash involving mucus membranes or skin necrosis: No Has patient had a PCN reaction that required hospitalization No Has patient had a PCN reaction occurring within the last 10 years: No If all of the above answers are "NO", then may proceed with Cephalosporin use.    Current Outpatient Medications on File Prior to Visit  Medication Sig Dispense Refill  . Ascorbic Acid (VITAMIN C) 1000 MG tablet Take 1,000 mg by mouth daily.      . AZO-CRANBERRY PO Take 1 capsule by mouth 2  (two) times daily.    . B Complex Vitamins (VITAMIN B COMPLEX PO) Take 1 tablet by mouth daily.    . Biotin 10 MG TABS Take 1 tablet by mouth every morning.     . Calcium Citrate-Vitamin D (CALCIUM CITRATE + D) 250-200 MG-UNIT TABS Take 1 tablet by mouth daily.    . cholecalciferol (VITAMIN D) 1000 units tablet Take 1,000 Units by mouth daily.    . cyclobenzaprine (FLEXERIL) 10 MG tablet Take 0.5-1 tablets (5-10 mg total) by mouth at bedtime as needed. 90 tablet 1  . estradiol (ESTRACE) 1 MG tablet Take 1 tablet (1 mg total) by mouth daily. 90  tablet 3  . famotidine (PEPCID) 20 MG tablet Take 1 tablet (20 mg total) by mouth 2 (two) times daily. 60 tablet 3  . Fexofenadine HCl (MUCINEX ALLERGY PO) Take 1 tablet by mouth daily as needed (Allergies).     . fluticasone (FLONASE) 50 MCG/ACT nasal spray PLACE 2 SPRAYS INTO BOTH NOSTRILS DAILY AS NEEDED FOR ALLERGIES. 16 g 2  . folic acid (FOLVITE) 1 MG tablet Take 1 mg by mouth daily.    . Homeopathic Products (LEG CRAMPS PM SL) Place 2 tablets under the tongue at bedtime.    Marland Kitchen levothyroxine (SYNTHROID, LEVOTHROID) 88 MCG tablet Take 1 tablet (88 mcg total) by mouth daily with breakfast. 90 tablet 3  . loratadine (CLARITIN) 10 MG tablet Take 10 mg by mouth daily as needed for allergies.     Marland Kitchen MAGNESIUM-OXIDE PO Take 1 tablet by mouth daily.    . montelukast (SINGULAIR) 10 MG tablet Take 1 tablet (10 mg total) by mouth at bedtime. 90 tablet 3  . Multiple Vitamin (MULTIVITAMIN) capsule Take 1 capsule by mouth daily.      . TURMERIC PO Take 1 capsule by mouth 2 (two) times daily.     . vitamin B-12 (CYANOCOBALAMIN) 500 MCG tablet Take 500 mcg by mouth daily.     No current facility-administered medications on file prior to visit.     Review of Systems  Constitutional: Positive for appetite change and fatigue. Negative for fever.  HENT: Positive for congestion, postnasal drip, rhinorrhea, sinus pressure, sneezing and sore throat. Negative for ear pain.    Eyes: Negative for pain and discharge.  Respiratory: Positive for cough. Negative for chest tightness, shortness of breath, wheezing and stridor.   Cardiovascular: Negative for chest pain.  Gastrointestinal: Negative for diarrhea, nausea and vomiting.  Genitourinary: Negative for frequency, hematuria and urgency.  Musculoskeletal: Negative for arthralgias and myalgias.  Skin: Negative for rash.  Neurological: Positive for headaches. Negative for dizziness, weakness and light-headedness.  Psychiatric/Behavioral: Negative for confusion and dysphoric mood.       Objective:   Physical Exam Constitutional:      General: She is not in acute distress.    Appearance: Normal appearance. She is obese. She is not ill-appearing.  HENT:     Head: Normocephalic and atraumatic.     Comments: Mild L maxillary sinus tenderness     Right Ear: Tympanic membrane and ear canal normal.     Left Ear: Tympanic membrane and ear canal normal.     Nose: Rhinorrhea present.     Mouth/Throat:     Mouth: Mucous membranes are moist.     Pharynx: Oropharynx is clear. No oropharyngeal exudate or posterior oropharyngeal erythema.     Comments: Some clear pnd Eyes:     General:        Right eye: No discharge.        Left eye: No discharge.     Conjunctiva/sclera: Conjunctivae normal.     Pupils: Pupils are equal, round, and reactive to light.  Cardiovascular:     Rate and Rhythm: Normal rate and regular rhythm.     Heart sounds: Normal heart sounds.  Pulmonary:     Effort: Pulmonary effort is normal. No respiratory distress.     Breath sounds: Normal breath sounds. No stridor. No wheezing, rhonchi or rales.     Comments: No rales/rhonchi Upper airways sounds heard (mild)   No wheezes even on forced exp Skin:    General: Skin is warm  and dry.     Capillary Refill: Capillary refill takes less than 2 seconds.     Coloration: Skin is not pale.     Findings: No rash.  Neurological:     Mental Status: She  is alert.     Cranial Nerves: No cranial nerve deficit.  Psychiatric:        Mood and Affect: Mood normal.     Comments: Despite grief - not tearful today           Assessment & Plan:   Problem List Items Addressed This Visit      Respiratory   Acute bronchitis - Primary    S/p uri 2 weeks  Cough is persistent  Reassuring exam  In light of length of illness- cover with zpack  Tessalon for addn cough control  Fluids/rest  Update if not starting to improve in a week or if worsening           Other   H/O atrial flutter    No re occurrence

## 2018-09-21 NOTE — Assessment & Plan Note (Signed)
No reoccurrence

## 2018-09-21 NOTE — Therapy (Signed)
[facility] [address] [place], [place], [address] Phone: [phone]   Fax:  [phone]  Physical Therapy Treatment  Patient Details  Name: Linda Mcgrath MRN: [id number] Date of Birth: 30-May-1933 Referring Provider (PT): [name]   Encounter Date: 09/21/2018  PT End of Session - 09/21/18 1326    Visit Number  31    Date for PT Re-Evaluation  10/23/18    PT Start Time  1255    PT Stop Time  1345    PT Time Calculation (min)  50 min    Activity Tolerance  Patient tolerated treatment well    Behavior During Therapy  [facility] for tasks assessed/performed       Past Medical History:  Diagnosis Date  . Allergic rhinitis   . Asthma   . Cataract    Bil/lens implant  . Colon polyp 1999   small polyp  . Diverticulosis   . Dysrhythmia   . Fibromyalgia   . Hyperlipidemia   . Hypothyroidism   . Internal hemorrhoids   . Leg cramps   . Lung nodule    stable LUL 9 mm  . Menopausal syndrome   . Osteoarthritis   . UTI (urinary tract infection)     Past Surgical History:  Procedure Laterality Date  . Abd U/S  11/1998   negative  . Abd U/S  01/2001   gallbladder polyps  . ABDOMINAL HYSTERECTOMY  1975   total-fibroids  . APPENDECTOMY  1975  . BREAST BIOPSY     benign  . BREAST EXCISIONAL BIOPSY Left 1957  . CARDIOVERSION N/A 03/07/2016   Procedure: CARDIOVERSION;  Surgeon: Adrian Prows, MD;  Location: [place];  Service: Cardiovascular;  Laterality: N/A;  . CATARACT EXTRACTION     bilateral  . CHOLECYSTECTOMY    . COLONOSCOPY  1998   Diverticulosis; polyp\  . COLONOSCOPY  09/2002   Diverticulosis, hem  . COLONOSCOPY  12/2007   diverticulosis, polyp  . DEXA  10/2001   osteopenia  . ELECTROPHYSIOLOGIC STUDY N/A 04/03/2016   Procedure: A-Flutter Ablation;  Surgeon: Will Meredith Leeds, MD;  Location: [place] CV LAB;  Service: Cardiovascular;  Laterality: N/A;  . ESOPHAGOGASTRODUODENOSCOPY  2004  . Hida  scan  01/2001   Negative  . KNEE SURGERY     right knee / 11/2004  . LAMINECTOMY WITH POSTERIOR LATERAL ARTHRODESIS LEVEL 2 N/A 07/31/2017   Procedure: DECOMPRESSIVE LAMINECTOMY LUMABR FOUR-FIVE, LUMBAR FIVE-SACRAL ONE;  Surgeon: Eustace Moore, MD;  Location: [place];  Service: Neurosurgery;  Laterality: N/A;  . laser surgery for glaucoma Left Sep 21, 2014  . ROTATOR CUFF REPAIR     x 2 /right shoulder  . SKIN CANCER EXCISION     pre-melanoma / on face  . TEE WITHOUT CARDIOVERSION N/A 03/07/2016   Procedure: TRANSESOPHAGEAL ECHOCARDIOGRAM (TEE);  Surgeon: Adrian Prows, MD;  Location: [place];  Service: Cardiovascular;  Laterality: N/A;  . TOE SURGERY     rt foot     There were no vitals filed for this visit.  Subjective Assessment - 09/21/18 1323    Subjective  Patient seems to be a little worse today, she saw the MD this AM due to her being diagnosed with bronchitis, she reports that she has not been sleeping due to coughing and her coughing is causing some increased back pain, she is still dealing with the death of her son and all the arrangements that need to be made  Hypothyroidism 08/24/2008  . Vitamin D deficiency 06/06/2008  . Hyperlipidemia 06/06/2008  . MENOPAUSAL SYNDROME 03/29/2008  . Osteopenia 03/29/2008  . ALLERGIC RHINITIS 01/25/2008  . IBS 01/25/2008  . OSTEOARTHRITIS 01/25/2008  . FIBROMYALGIA 01/25/2008    [name], PT 09/21/2018, 1:29 PM  [place] [address] [place], [place], [address] Phone: [phone]   Fax:  [phone]  Name: Linda Mcgrath MRN: [id number] Date of Birth: 23-Sep-1932  [facility] [place], [place], [address] Phone: [phone]   Fax:  [phone]  Physical Therapy Treatment  Patient Details  Name: Linda Mcgrath MRN: [id number] Date of Birth: 30-May-1933 Referring Provider (PT): [name]   Encounter Date: 09/21/2018  PT End of Session - 09/21/18 1326    Visit Number  31    Date for PT Re-Evaluation  10/23/18    PT Start Time  1255    PT Stop Time  1345    PT Time Calculation (min)  50 min    Activity Tolerance  Patient tolerated treatment well    Behavior During Therapy  [facility] for tasks assessed/performed       Past Medical History:  Diagnosis Date  . Allergic rhinitis   . Asthma   . Cataract    Bil/lens implant  . Colon polyp 1999   small polyp  . Diverticulosis   . Dysrhythmia   . Fibromyalgia   . Hyperlipidemia   . Hypothyroidism   . Internal hemorrhoids   . Leg cramps   . Lung nodule    stable LUL 9 mm  . Menopausal syndrome   . Osteoarthritis   . UTI (urinary tract infection)     Past Surgical History:  Procedure Laterality Date  . Abd U/S  11/1998   negative  . Abd U/S  01/2001   gallbladder polyps  . ABDOMINAL HYSTERECTOMY  1975   total-fibroids  . APPENDECTOMY  1975  . BREAST BIOPSY     benign  . BREAST EXCISIONAL BIOPSY Left 1957  . CARDIOVERSION N/A 03/07/2016   Procedure: CARDIOVERSION;  Surgeon: Adrian Prows, MD;  Location: [place];  Service: Cardiovascular;  Laterality: N/A;  . CATARACT EXTRACTION     bilateral  . CHOLECYSTECTOMY    . COLONOSCOPY  1998   Diverticulosis; polyp\  . COLONOSCOPY  09/2002   Diverticulosis, hem  . COLONOSCOPY  12/2007   diverticulosis, polyp  . DEXA  10/2001   osteopenia  . ELECTROPHYSIOLOGIC STUDY N/A 04/03/2016   Procedure: A-Flutter Ablation;  Surgeon: Will Meredith Leeds, MD;  Location: [place] CV LAB;  Service: Cardiovascular;  Laterality: N/A;  . ESOPHAGOGASTRODUODENOSCOPY  2004  . Hida  scan  01/2001   Negative  . KNEE SURGERY     right knee / 11/2004  . LAMINECTOMY WITH POSTERIOR LATERAL ARTHRODESIS LEVEL 2 N/A 07/31/2017   Procedure: DECOMPRESSIVE LAMINECTOMY LUMABR FOUR-FIVE, LUMBAR FIVE-SACRAL ONE;  Surgeon: Eustace Moore, MD;  Location: [place] [place];  Service: Neurosurgery;  Laterality: N/A;  . laser surgery for glaucoma Left Sep 21, 2014  . ROTATOR CUFF REPAIR     x 2 /right shoulder  . SKIN CANCER EXCISION     pre-melanoma / on face  . TEE WITHOUT CARDIOVERSION N/A 03/07/2016   Procedure: TRANSESOPHAGEAL ECHOCARDIOGRAM (TEE);  Surgeon: Adrian Prows, MD;  Location: [place];  Service: Cardiovascular;  Laterality: N/A;  . TOE SURGERY     rt foot     There were no vitals filed for this visit.  Subjective Assessment - 09/21/18 1323    Subjective  Patient seems to be a little worse today, she saw the MD this AM due to her being diagnosed with bronchitis, she reports that she has not been sleeping due to coughing and her coughing is causing some increased back pain, she is still dealing with the death of her son and all the arrangements that need to be made

## 2018-09-21 NOTE — Patient Instructions (Signed)
Take care of yourself  Rest when you can   Take the zpak as directed  Tessalon for cough  Continue the over the counter medicines if they help   Update if not starting to improve in a week or if worsening

## 2018-09-23 ENCOUNTER — Ambulatory Visit: Payer: Medicare Other | Admitting: Physical Therapy

## 2018-09-23 ENCOUNTER — Encounter: Payer: Self-pay | Admitting: Physical Therapy

## 2018-09-23 DIAGNOSIS — R262 Difficulty in walking, not elsewhere classified: Secondary | ICD-10-CM

## 2018-09-23 DIAGNOSIS — M62830 Muscle spasm of back: Secondary | ICD-10-CM

## 2018-09-23 DIAGNOSIS — M5441 Lumbago with sciatica, right side: Secondary | ICD-10-CM

## 2018-09-23 NOTE — Therapy (Signed)
11/06/2015  . Urge incontinence 01/24/2015  . Estrogen deficiency 10/25/2014  . Lichen planus 29/24/4628  . Encounter for Medicare annual wellness exam 06/17/2013  . Screening mammogram, encounter for 06/15/2012  . Hyperglycemia 06/03/2011  . HYPERTENSION, BENIGN ESSENTIAL 10/23/2010  . CONSTIPATION 10/23/2010  . HEMATOCHEZIA 10/23/2010  . OSTEOARTHRITIS, HANDS, BILATERAL 01/24/2010  . Hypothyroidism 08/24/2008  . Vitamin D deficiency 06/06/2008  . Hyperlipidemia 06/06/2008  . MENOPAUSAL SYNDROME 03/29/2008  . Osteopenia 03/29/2008  . ALLERGIC RHINITIS 01/25/2008  . IBS 01/25/2008  . OSTEOARTHRITIS 01/25/2008  . FIBROMYALGIA 01/25/2008    [name], PT 09/23/2018, 1:32 PM  [place], [place], [address] Phone: [phone]   Fax:  [phone]  Name: Linda Mcgrath MRN: [id number] Date of Birth: 21-Mar-1933  11/06/2015  . Urge incontinence 01/24/2015  . Estrogen deficiency 10/25/2014  . Lichen planus 29/24/4628  . Encounter for Medicare annual wellness exam 06/17/2013  . Screening mammogram, encounter for 06/15/2012  . Hyperglycemia 06/03/2011  . HYPERTENSION, BENIGN ESSENTIAL 10/23/2010  . CONSTIPATION 10/23/2010  . HEMATOCHEZIA 10/23/2010  . OSTEOARTHRITIS, HANDS, BILATERAL 01/24/2010  . Hypothyroidism 08/24/2008  . Vitamin D deficiency 06/06/2008  . Hyperlipidemia 06/06/2008  . MENOPAUSAL SYNDROME 03/29/2008  . Osteopenia 03/29/2008  . ALLERGIC RHINITIS 01/25/2008  . IBS 01/25/2008  . OSTEOARTHRITIS 01/25/2008  . FIBROMYALGIA 01/25/2008    [name], PT 09/23/2018, 1:32 PM  [place], [place], [address] Phone: [phone]   Fax:  [phone]  Name: Linda Mcgrath MRN: [id number] Date of Birth: 21-Mar-1933  [place], [place], [address] Phone: [phone]   Fax:  [phone]  Physical Therapy Treatment  Patient Details  Name: Linda Mcgrath MRN: [id number] Date of Birth: [birth year] Referring Provider (PT): [name]   Encounter Date: 09/23/2018  PT End of Session - 09/23/18 1327    Visit Number  32    Date for PT Re-Evaluation  10/23/18    PT Start Time  1255    PT Stop Time  1345    PT Time Calculation (min)  50 min    Activity Tolerance  Treatment limited secondary to medical complications (Comment)    Behavior During Therapy  [facility] for tasks assessed/performed       Past Medical History:  Diagnosis Date  . Allergic rhinitis   . Asthma   . Cataract    Bil/lens implant  . Colon polyp 1999   small polyp  . Diverticulosis   . Dysrhythmia   . Fibromyalgia   . Hyperlipidemia   . Hypothyroidism   . Internal hemorrhoids   . Leg cramps   . Lung nodule    stable LUL 9 mm  . Menopausal syndrome   . Osteoarthritis   . UTI (urinary tract infection)     Past Surgical History:  Procedure Laterality Date  . Abd U/S  11/1998   negative  . Abd U/S  01/2001   gallbladder polyps  . ABDOMINAL HYSTERECTOMY  1975   total-fibroids  . APPENDECTOMY  1975  . BREAST BIOPSY     benign  . BREAST EXCISIONAL BIOPSY Left 1957  . CARDIOVERSION N/A 03/07/2016   Procedure: CARDIOVERSION;  Surgeon: Adrian Prows, MD;  Location: [place];  Service: Cardiovascular;  Laterality: N/A;  . CATARACT EXTRACTION     bilateral  . CHOLECYSTECTOMY    . COLONOSCOPY  1998   Diverticulosis; polyp\  . COLONOSCOPY  09/2002   Diverticulosis, hem  . COLONOSCOPY  12/2007   diverticulosis, polyp  . DEXA  10/2001   osteopenia  . ELECTROPHYSIOLOGIC STUDY N/A 04/03/2016   Procedure: A-Flutter Ablation;  Surgeon: Will Meredith Leeds, MD;  Location: [place] CV LAB;  Service: Cardiovascular;  Laterality: N/A;  .  ESOPHAGOGASTRODUODENOSCOPY  2004  . Hida scan  01/2001   Negative  . KNEE SURGERY     right knee / 11/2004  . LAMINECTOMY WITH POSTERIOR LATERAL ARTHRODESIS LEVEL 2 N/A 07/31/2017   Procedure: DECOMPRESSIVE LAMINECTOMY LUMABR FOUR-FIVE, LUMBAR FIVE-SACRAL ONE;  Surgeon: Eustace Moore, MD;  Location: [place];  Service: Neurosurgery;  Laterality: N/A;  . laser surgery for glaucoma Left Sep 21, 2014  . ROTATOR CUFF REPAIR     x 2 /right shoulder  . SKIN CANCER EXCISION     pre-melanoma / on face  . TEE WITHOUT CARDIOVERSION N/A 03/07/2016   Procedure: TRANSESOPHAGEAL ECHOCARDIOGRAM (TEE);  Surgeon: Adrian Prows, MD;  Location: [place];  Service: Cardiovascular;  Laterality: N/A;  . TOE SURGERY     rt foot     There were no vitals filed for this visit.  Subjective Assessment - 09/23/18 1323    Subjective  Patient continues with bronchitis, reports less coughing taking medicine, still some coughing and the increased pain.  I talked with her about worrying about her regressing due to not being able to be as active, as she reports shortness of breath with any activity    Currently in Pain?  Yes    Pain Score

## 2018-09-28 ENCOUNTER — Telehealth: Payer: Self-pay | Admitting: Family Medicine

## 2018-09-28 ENCOUNTER — Encounter: Payer: Self-pay | Admitting: Physical Therapy

## 2018-09-28 ENCOUNTER — Ambulatory Visit: Payer: Medicare Other | Attending: Family Medicine | Admitting: Physical Therapy

## 2018-09-28 DIAGNOSIS — M5441 Lumbago with sciatica, right side: Secondary | ICD-10-CM | POA: Insufficient documentation

## 2018-09-28 DIAGNOSIS — M62830 Muscle spasm of back: Secondary | ICD-10-CM

## 2018-09-28 DIAGNOSIS — J209 Acute bronchitis, unspecified: Secondary | ICD-10-CM

## 2018-09-28 DIAGNOSIS — R262 Difficulty in walking, not elsewhere classified: Secondary | ICD-10-CM | POA: Insufficient documentation

## 2018-09-28 NOTE — Therapy (Signed)
Linda Mcgrath, [place], [address] Phone: [phone]   Fax:  [phone]  Physical Therapy Treatment  Patient Details  Name: Linda Mcgrath MRN: [id number] Date of Birth: [birth year] Referring Provider (PT): [name]   Encounter Date: 09/28/2018  PT End of Session - 09/28/18 1344    Visit Number  33    Date for PT Re-Evaluation  10/23/18    PT Start Time  1300    PT Stop Time  1350    PT Time Calculation (min)  50 min    Activity Tolerance  Treatment limited secondary to medical complications (Comment)    Behavior During Therapy  [facility] for tasks assessed/performed       Past Medical History:  Diagnosis Date  . Allergic rhinitis   . Asthma   . Cataract    Bil/lens implant  . Colon polyp 1999   small polyp  . Diverticulosis   . Dysrhythmia   . Fibromyalgia   . Hyperlipidemia   . Hypothyroidism   . Internal hemorrhoids   . Leg cramps   . Lung nodule    stable LUL 9 mm  . Menopausal syndrome   . Osteoarthritis   . UTI (urinary tract infection)     Past Surgical History:  Procedure Laterality Date  . Abd U/S  11/1998   negative  . Abd U/S  01/2001   gallbladder polyps  . ABDOMINAL HYSTERECTOMY  1975   total-fibroids  . APPENDECTOMY  1975  . BREAST BIOPSY     benign  . BREAST EXCISIONAL BIOPSY Left 1957  . CARDIOVERSION N/A 03/07/2016   Procedure: CARDIOVERSION;  Surgeon: Adrian Prows, MD;  Location: [place];  Service: Cardiovascular;  Laterality: N/A;  . CATARACT EXTRACTION     bilateral  . CHOLECYSTECTOMY    . COLONOSCOPY  1998   Diverticulosis; polyp\  . COLONOSCOPY  09/2002   Diverticulosis, hem  . COLONOSCOPY  12/2007   diverticulosis, polyp  . DEXA  10/2001   osteopenia  . ELECTROPHYSIOLOGIC STUDY N/A 04/03/2016   Procedure: A-Flutter Ablation;  Surgeon: Will Meredith Leeds, MD;  Location: [facility];  Service: Cardiovascular;  Laterality: N/A;  .  ESOPHAGOGASTRODUODENOSCOPY  2004  . Hida scan  01/2001   Negative  . KNEE SURGERY     right knee / 11/2004  . LAMINECTOMY WITH POSTERIOR LATERAL ARTHRODESIS LEVEL 2 N/A 07/31/2017   Procedure: DECOMPRESSIVE LAMINECTOMY LUMABR FOUR-FIVE, LUMBAR FIVE-SACRAL ONE;  Surgeon: Eustace Moore, MD;  Location: [place];  Service: Neurosurgery;  Laterality: N/A;  . laser surgery for glaucoma Left Sep 21, 2014  . ROTATOR CUFF REPAIR     x 2 /right shoulder  . SKIN CANCER EXCISION     pre-melanoma / on face  . TEE WITHOUT CARDIOVERSION N/A 03/07/2016   Procedure: TRANSESOPHAGEAL ECHOCARDIOGRAM (TEE);  Surgeon: Adrian Prows, MD;  Location: [facility];  Service: Cardiovascular;  Laterality: N/A;  . TOE SURGERY     rt foot     There were no vitals filed for this visit.  Subjective Assessment - 09/28/18 1328    Subjective  Patient had her sons visitation this weekend.  She reports that she was on her feet a lot, reports that the bronchitis is still bothering her.    Currently in Pain?  Yes    Pain Score  6     Pain Location  Back    Pain Orientation  Right;Lower  10/23/2010  . OSTEOARTHRITIS, HANDS, BILATERAL 01/24/2010  . Hypothyroidism 08/24/2008  . Vitamin D deficiency 06/06/2008  . Hyperlipidemia 06/06/2008  . MENOPAUSAL SYNDROME 03/29/2008  . Osteopenia 03/29/2008  . ALLERGIC RHINITIS 01/25/2008  . IBS 01/25/2008  . OSTEOARTHRITIS 01/25/2008  . FIBROMYALGIA 01/25/2008    [name], PT 09/28/2018, 1:48 PM  [facility] [place], [place], [address] Phone: [phone]   Fax:  [phone]  Name: Linda Mcgrath MRN: [id number] Date of Birth: 20-Apr-1933  Linda Mcgrath, [place], [address] Phone: [phone]   Fax:  [phone]  Physical Therapy Treatment  Patient Details  Name: Linda Mcgrath MRN: [id number] Date of Birth: [birth year] Referring Provider (PT): [name]   Encounter Date: 09/28/2018  PT End of Session - 09/28/18 1344    Visit Number  33    Date for PT Re-Evaluation  10/23/18    PT Start Time  1300    PT Stop Time  1350    PT Time Calculation (min)  50 min    Activity Tolerance  Treatment limited secondary to medical complications (Comment)    Behavior During Therapy  [facility] for tasks assessed/performed       Past Medical History:  Diagnosis Date  . Allergic rhinitis   . Asthma   . Cataract    Bil/lens implant  . Colon polyp 1999   small polyp  . Diverticulosis   . Dysrhythmia   . Fibromyalgia   . Hyperlipidemia   . Hypothyroidism   . Internal hemorrhoids   . Leg cramps   . Lung nodule    stable LUL 9 mm  . Menopausal syndrome   . Osteoarthritis   . UTI (urinary tract infection)     Past Surgical History:  Procedure Laterality Date  . Abd U/S  11/1998   negative  . Abd U/S  01/2001   gallbladder polyps  . ABDOMINAL HYSTERECTOMY  1975   total-fibroids  . APPENDECTOMY  1975  . BREAST BIOPSY     benign  . BREAST EXCISIONAL BIOPSY Left 1957  . CARDIOVERSION N/A 03/07/2016   Procedure: CARDIOVERSION;  Surgeon: Adrian Prows, MD;  Location: [facility];  Service: Cardiovascular;  Laterality: N/A;  . CATARACT EXTRACTION     bilateral  . CHOLECYSTECTOMY    . COLONOSCOPY  1998   Diverticulosis; polyp\  . COLONOSCOPY  09/2002   Diverticulosis, hem  . COLONOSCOPY  12/2007   diverticulosis, polyp  . DEXA  10/2001   osteopenia  . ELECTROPHYSIOLOGIC STUDY N/A 04/03/2016   Procedure: A-Flutter Ablation;  Surgeon: Will Meredith Leeds, MD;  Location: [facility];  Service: Cardiovascular;  Laterality: N/A;  .  ESOPHAGOGASTRODUODENOSCOPY  2004  . Hida scan  01/2001   Negative  . KNEE SURGERY     right knee / 11/2004  . LAMINECTOMY WITH POSTERIOR LATERAL ARTHRODESIS LEVEL 2 N/A 07/31/2017   Procedure: DECOMPRESSIVE LAMINECTOMY LUMABR FOUR-FIVE, LUMBAR FIVE-SACRAL ONE;  Surgeon: Eustace Moore, MD;  Location: [place];  Service: Neurosurgery;  Laterality: N/A;  . laser surgery for glaucoma Left Sep 21, 2014  . ROTATOR CUFF REPAIR     x 2 /right shoulder  . SKIN CANCER EXCISION     pre-melanoma / on face  . TEE WITHOUT CARDIOVERSION N/A 03/07/2016   Procedure: TRANSESOPHAGEAL ECHOCARDIOGRAM (TEE);  Surgeon: Adrian Prows, MD;  Location: [facility];  Service: Cardiovascular;  Laterality: N/A;  . TOE SURGERY     rt foot     There were no vitals filed for this visit.  Subjective Assessment - 09/28/18 1328    Subjective  Patient had her sons visitation this weekend.  She reports that she was on her feet a lot, reports that the bronchitis is still bothering her.    Currently in Pain?  Yes    Pain Score  6     Pain Location  Back    Pain Orientation  Right;Lower

## 2018-09-28 NOTE — Telephone Encounter (Signed)
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

## 2018-09-28 NOTE — Telephone Encounter (Signed)
Received note from pt re: symptoms  She finished zpack for bronchitis Notes she is improved but still coughing  Thick mucous/green to clear Hoarse voice   Taking mucinex DM max Also antihistamine and decongestant  Cool air vaporizer  No fever    Please advise her to continue the mucinex DM  Lots of water with this so it can loosen the crus in her chest  Vaporizer is good  Antihistamine /decongestant ok if they help   Please ask if the tessalon helps cough at all?  Also ask if any wheezing   If no further improvement in next week we may need to check a cxr  Thanks

## 2018-09-29 NOTE — Telephone Encounter (Signed)
Thanks keep me posted

## 2018-09-29 NOTE — Telephone Encounter (Signed)
Pt said she is taking tessalon but she is still coughing some, it's mostly a dry hacky cough but sometimes it can be a little productive. Pt said that it feels like it's coming from the post nasal drip that's in her throat. Pt is doing everything DR. [name] recommended in prev message so she will give it a few more days and if no improvement by the end of the week she will call back for a chest xray

## 2018-09-30 ENCOUNTER — Ambulatory Visit: Payer: Medicare Other | Admitting: Physical Therapy

## 2018-09-30 ENCOUNTER — Encounter: Payer: Self-pay | Admitting: Physical Therapy

## 2018-09-30 DIAGNOSIS — M62830 Muscle spasm of back: Secondary | ICD-10-CM

## 2018-09-30 DIAGNOSIS — M5441 Lumbago with sciatica, right side: Secondary | ICD-10-CM | POA: Diagnosis not present

## 2018-09-30 DIAGNOSIS — R262 Difficulty in walking, not elsewhere classified: Secondary | ICD-10-CM | POA: Diagnosis not present

## 2018-09-30 NOTE — Therapy (Signed)
01/25/2008    , W., PT 09/30/2018, 1:32 PM  [facility] [address] [place], [place], [address] Phone: [phone]   Fax:  [phone]  Name: Linda Mcgrath MRN: [id number] Date of Birth: 11/13/1932     [facility] [address] [place], [place], [address] Phone: [phone]   Fax:  [phone]  Physical Therapy Treatment  Patient Details  Name: Linda Mcgrath MRN: [id number] Date of Birth: 03/11/1933 Referring Provider (PT): [name]   Encounter Date: 09/30/2018  PT End of Session - 09/30/18 1330    Visit Number  34    Date for PT Re-Evaluation  10/23/18    PT Start Time  1300    PT Stop Time  1345    PT Time Calculation (min)  45 min    Activity Tolerance  Treatment limited secondary to medical complications (Comment)    Behavior During Therapy  [facility] for tasks assessed/performed       Past Medical History:  Diagnosis Date  . Allergic rhinitis   . Asthma   . Cataract    Bil/lens implant  . Colon polyp 1999   small polyp  . Diverticulosis   . Dysrhythmia   . Fibromyalgia   . Hyperlipidemia   . Hypothyroidism   . Internal hemorrhoids   . Leg cramps   . Lung nodule    stable LUL 9 mm  . Menopausal syndrome   . Osteoarthritis   . UTI (urinary tract infection)     Past Surgical History:  Procedure Laterality Date  . Abd U/S  11/1998   negative  . Abd U/S  01/2001   gallbladder polyps  . ABDOMINAL HYSTERECTOMY  1975   total-fibroids  . APPENDECTOMY  1975  . BREAST BIOPSY     benign  . BREAST EXCISIONAL BIOPSY Left 1957  . CARDIOVERSION N/A 03/07/2016   Procedure: CARDIOVERSION;  Surgeon: Jay Ganji, MD;  [facility];  Service: Cardiovascular;  Laterality: N/A;  . CATARACT EXTRACTION     bilateral  . CHOLECYSTECTOMY    . COLONOSCOPY  1998   Diverticulosis; polyp\  . COLONOSCOPY  09/2002   Diverticulosis, hem  . COLONOSCOPY  12/2007   diverticulosis, polyp  . DEXA  10/2001   osteopenia  . ELECTROPHYSIOLOGIC STUDY N/A 04/03/2016   Procedure: A-Flutter Ablation;  Surgeon: Will Martin Camnitz, MD;  [facility];  Service: Cardiovascular;  Laterality: N/A;  .  ESOPHAGOGASTRODUODENOSCOPY  2004  . Hida scan  01/2001   Negative  . KNEE SURGERY     right knee / 11/2004  . LAMINECTOMY WITH POSTERIOR LATERAL ARTHRODESIS LEVEL 2 N/A 07/31/2017   Procedure: DECOMPRESSIVE LAMINECTOMY LUMABR FOUR-FIVE, LUMBAR FIVE-SACRAL ONE;  Surgeon: Jones, David S, MD;  [facility];  Service: Neurosurgery;  Laterality: N/A;  . laser surgery for glaucoma Left Sep 21, 2014  . ROTATOR CUFF REPAIR     x 2 /right shoulder  . SKIN CANCER EXCISION     pre-melanoma / on face  . TEE WITHOUT CARDIOVERSION N/A 03/07/2016   Procedure: TRANSESOPHAGEAL ECHOCARDIOGRAM (TEE);  Surgeon: Jay Ganji, MD;  [facility];  Service: Cardiovascular;  Laterality: N/A;  . TOE SURGERY     rt foot     There were no vitals filed for this visit.  Subjective Assessment - 09/30/18 1326    Subjective  Patient continues to have coughing fits and this is causing increased back pain and spasms with this, she sounds like she is having a little more congestion, she is to call the MD in the AM    Currently in Pain?  Yes    Pain Score  7     Pain Location    01/25/2008    , W., PT 09/30/2018, 1:32 PM  [facility] [address] [place], [place], [address] Phone: [phone]   Fax:  [phone]  Name: Linda Mcgrath MRN: [id number] Date of Birth: 05/21/1933   

## 2018-10-01 NOTE — Telephone Encounter (Signed)
Pt stated she was coming in tomorrow for a chest xray, because she isn't feeling any better.

## 2018-10-01 NOTE — Telephone Encounter (Signed)
I put the order in for tomorrow

## 2018-10-01 NOTE — Addendum Note (Signed)
Addended by: Loura Pardon A on: 10/01/2018 10:27 AM   Modules accepted: Orders

## 2018-10-01 NOTE — Telephone Encounter (Signed)
Pt notified xray order in and she can come in and get it done when able

## 2018-10-02 ENCOUNTER — Ambulatory Visit (INDEPENDENT_AMBULATORY_CARE_PROVIDER_SITE_OTHER)
Admission: RE | Admit: 2018-10-02 | Discharge: 2018-10-02 | Disposition: A | Discharge status: Home / Self Care | Payer: Medicare Other | Source: Ambulatory Visit | Attending: Family Medicine | Admitting: Family Medicine

## 2018-10-02 DIAGNOSIS — R05 Cough: Secondary | ICD-10-CM | POA: Diagnosis not present

## 2018-10-02 DIAGNOSIS — J209 Acute bronchitis, unspecified: Secondary | ICD-10-CM

## 2018-10-05 ENCOUNTER — Ambulatory Visit: Payer: Medicare Other | Admitting: Physical Therapy

## 2018-10-05 ENCOUNTER — Encounter: Payer: Self-pay | Admitting: Physical Therapy

## 2018-10-05 DIAGNOSIS — M62830 Muscle spasm of back: Secondary | ICD-10-CM | POA: Diagnosis not present

## 2018-10-05 DIAGNOSIS — R262 Difficulty in walking, not elsewhere classified: Secondary | ICD-10-CM

## 2018-10-05 DIAGNOSIS — M5441 Lumbago with sciatica, right side: Secondary | ICD-10-CM

## 2018-10-05 NOTE — Therapy (Signed)
[facility], [place], [address] Phone: [phone]   Fax:  [phone]  Physical Therapy Treatment  Patient Details  Name: Linda Mcgrath MRN: [id number] Date of Birth: 10-02-1932 Referring Provider (PT): [name]   Encounter Date: 10/05/2018  PT End of Session - 10/05/18 1341    Visit Number  35    Date for PT Re-Evaluation  10/23/18    PT Start Time  1300    PT Stop Time  1350    PT Time Calculation (min)  50 min    Activity Tolerance  Treatment limited secondary to medical complications (Comment)    Behavior During Therapy  [facility] for tasks assessed/performed       Past Medical History:  Diagnosis Date  . Allergic rhinitis   . Asthma   . Cataract    Bil/lens implant  . Colon polyp 1999   small polyp  . Diverticulosis   . Dysrhythmia   . Fibromyalgia   . Hyperlipidemia   . Hypothyroidism   . Internal hemorrhoids   . Leg cramps   . Lung nodule    stable LUL 9 mm  . Menopausal syndrome   . Osteoarthritis   . UTI (urinary tract infection)     Past Surgical History:  Procedure Laterality Date  . Abd U/S  11/1998   negative  . Abd U/S  01/2001   gallbladder polyps  . ABDOMINAL HYSTERECTOMY  1975   total-fibroids  . APPENDECTOMY  1975  . BREAST BIOPSY     benign  . BREAST EXCISIONAL BIOPSY Left 1957  . CARDIOVERSION N/A 03/07/2016   Procedure: CARDIOVERSION;  Surgeon: Adrian Prows, MD;  Location: [facility];  Service: Cardiovascular;  Laterality: N/A;  . CATARACT EXTRACTION     bilateral  . CHOLECYSTECTOMY    . COLONOSCOPY  1998   Diverticulosis; polyp\  . COLONOSCOPY  09/2002   Diverticulosis, hem  . COLONOSCOPY  12/2007   diverticulosis, polyp  . DEXA  10/2001   osteopenia  . ELECTROPHYSIOLOGIC STUDY N/A 04/03/2016   Procedure: A-Flutter Ablation;  Surgeon: Will Meredith Leeds, MD;  Location: [facility];  Service: Cardiovascular;  Laterality: N/A;  .  ESOPHAGOGASTRODUODENOSCOPY  2004  . Hida scan  01/2001   Negative  . KNEE SURGERY     right knee / 11/2004  . LAMINECTOMY WITH POSTERIOR LATERAL ARTHRODESIS LEVEL 2 N/A 07/31/2017   Procedure: DECOMPRESSIVE LAMINECTOMY LUMABR FOUR-FIVE, LUMBAR FIVE-SACRAL ONE;  Surgeon: Eustace Moore, MD;  Location: [facility];  Service: Neurosurgery;  Laterality: N/A;  . laser surgery for glaucoma Left Sep 21, 2014  . ROTATOR CUFF REPAIR     x 2 /right shoulder  . SKIN CANCER EXCISION     pre-melanoma / on face  . TEE WITHOUT CARDIOVERSION N/A 03/07/2016   Procedure: TRANSESOPHAGEAL ECHOCARDIOGRAM (TEE);  Surgeon: Adrian Prows, MD;  Location: [facility];  Service: Cardiovascular;  Laterality: N/A;  . TOE SURGERY     rt foot     There were no vitals filed for this visit.  Subjective Assessment - 10/05/18 1333    Subjective  Patient had a chest x-ray, all was negative, she reports less coughing.  Still some incresaed soreness    Currently in Pain?  Yes    Pain Score  5     Pain Location  Back    Pain Orientation  Right;Lower    Pain Descriptors / Indicators  Sore;Spasm  W., PT 10/05/2018, 1:47 PM  [facility] [place], [place], [address] Phone: [phone]   Fax:  [phone]  Name: ZHOE CATANIA MRN: [id number] Date of Birth: [birth year]  [facility] [place], [place], [address] Phone: [phone]   Fax:  [phone]  Physical Therapy Treatment  Patient Details  Name: Linda Mcgrath MRN: [id number] Date of Birth: 10-02-1932 Referring Provider (PT): [name]   Encounter Date: 10/05/2018  PT End of Session - 10/05/18 1341    Visit Number  35    Date for PT Re-Evaluation  10/23/18    PT Start Time  1300    PT Stop Time  1350    PT Time Calculation (min)  50 min    Activity Tolerance  Treatment limited secondary to medical complications (Comment)    Behavior During Therapy  [facility] for tasks assessed/performed       Past Medical History:  Diagnosis Date  . Allergic rhinitis   . Asthma   . Cataract    Bil/lens implant  . Colon polyp 1999   small polyp  . Diverticulosis   . Dysrhythmia   . Fibromyalgia   . Hyperlipidemia   . Hypothyroidism   . Internal hemorrhoids   . Leg cramps   . Lung nodule    stable LUL 9 mm  . Menopausal syndrome   . Osteoarthritis   . UTI (urinary tract infection)     Past Surgical History:  Procedure Laterality Date  . Abd U/S  11/1998   negative  . Abd U/S  01/2001   gallbladder polyps  . ABDOMINAL HYSTERECTOMY  1975   total-fibroids  . APPENDECTOMY  1975  . BREAST BIOPSY     benign  . BREAST EXCISIONAL BIOPSY Left 1957  . CARDIOVERSION N/A 03/07/2016   Procedure: CARDIOVERSION;  Surgeon: Adrian Prows, MD;  Location: [facility];  Service: Cardiovascular;  Laterality: N/A;  . CATARACT EXTRACTION     bilateral  . CHOLECYSTECTOMY    . COLONOSCOPY  1998   Diverticulosis; polyp\  . COLONOSCOPY  09/2002   Diverticulosis, hem  . COLONOSCOPY  12/2007   diverticulosis, polyp  . DEXA  10/2001   osteopenia  . ELECTROPHYSIOLOGIC STUDY N/A 04/03/2016   Procedure: A-Flutter Ablation;  Surgeon: Will Meredith Leeds, MD;  Location: [facility];  Service: Cardiovascular;  Laterality: N/A;  .  ESOPHAGOGASTRODUODENOSCOPY  2004  . Hida scan  01/2001   Negative  . KNEE SURGERY     right knee / 11/2004  . LAMINECTOMY WITH POSTERIOR LATERAL ARTHRODESIS LEVEL 2 N/A 07/31/2017   Procedure: DECOMPRESSIVE LAMINECTOMY LUMABR FOUR-FIVE, LUMBAR FIVE-SACRAL ONE;  Surgeon: Eustace Moore, MD;  Location: [facility];  Service: Neurosurgery;  Laterality: N/A;  . laser surgery for glaucoma Left Sep 21, 2014  . ROTATOR CUFF REPAIR     x 2 /right shoulder  . SKIN CANCER EXCISION     pre-melanoma / on face  . TEE WITHOUT CARDIOVERSION N/A 03/07/2016   Procedure: TRANSESOPHAGEAL ECHOCARDIOGRAM (TEE);  Surgeon: Adrian Prows, MD;  Location: [facility];  Service: Cardiovascular;  Laterality: N/A;  . TOE SURGERY     rt foot     There were no vitals filed for this visit.  Subjective Assessment - 10/05/18 1333    Subjective  Patient had a chest x-ray, all was negative, she reports less coughing.  Still some incresaed soreness    Currently in Pain?  Yes    Pain Score  5     Pain Location  Back    Pain Orientation  Right;Lower    Pain Descriptors / Indicators  Sore;Spasm

## 2018-10-05 NOTE — Telephone Encounter (Signed)
Pt returned call via [facility] [employer] on 10/02/18 at 5:07pm  [place] Telephone Record [place] Night - Client Client Site [place] - Night Contact Type Call Who Is Calling Patient / Member / Family / Caregiver Caller Name Cynthie Garmon Phone Number [phone] Call Type Message Only Information Provided Reason for Call Returning a Call from the Office Initial [place] states that they are calling about having missed a call from the doctor. Additional Comment DOB: [birth year] Call Closed By: Graciella Belton Transaction Date/Time: 10/02/2018 5:07:37 PM (ET)

## 2018-10-05 NOTE — Telephone Encounter (Signed)
Addressed in result notes  

## 2018-10-07 ENCOUNTER — Ambulatory Visit: Payer: Medicare Other | Admitting: Physical Therapy

## 2018-10-07 ENCOUNTER — Encounter: Payer: Self-pay | Admitting: Physical Therapy

## 2018-10-07 DIAGNOSIS — M5441 Lumbago with sciatica, right side: Secondary | ICD-10-CM

## 2018-10-07 DIAGNOSIS — M62830 Muscle spasm of back: Secondary | ICD-10-CM

## 2018-10-07 DIAGNOSIS — R262 Difficulty in walking, not elsewhere classified: Secondary | ICD-10-CM | POA: Diagnosis not present

## 2018-10-07 NOTE — Therapy (Signed)
[phone]  Name: Linda Mcgrath MRN: [id number] Date of Birth: 20-Mar-1933  [phone]  Name: Linda Mcgrath MRN: [id number] Date of Birth: 20-Mar-1933  [place], [place], [address] Phone: [phone]   Fax:  [phone]  Physical Therapy Treatment  Patient Details  Name: Linda Mcgrath MRN: [id number] Date of Birth: November 08, 1932 Referring Provider (PT): [name]   Encounter Date: 10/07/2018  PT End of Session - 10/07/18 1329    Visit Number  36    Date for PT Re-Evaluation  10/23/18    PT Start Time  1256    PT Stop Time  1342    PT Time Calculation (min)  46 min    Activity Tolerance  Treatment limited secondary to medical complications (Comment)    Behavior During Therapy  [facility] for tasks assessed/performed       Past Medical History:  Diagnosis Date  . Allergic rhinitis   . Asthma   . Cataract    Bil/lens implant  . Colon polyp 1999   small polyp  . Diverticulosis   . Dysrhythmia   . Fibromyalgia   . Hyperlipidemia   . Hypothyroidism   . Internal hemorrhoids   . Leg cramps   . Lung nodule    stable LUL 9 mm  . Menopausal syndrome   . Osteoarthritis   . UTI (urinary tract infection)     Past Surgical History:  Procedure Laterality Date  . Abd U/S  11/1998   negative  . Abd U/S  01/2001   gallbladder polyps  . ABDOMINAL HYSTERECTOMY  1975   total-fibroids  . APPENDECTOMY  1975  . BREAST BIOPSY     benign  . BREAST EXCISIONAL BIOPSY Left 1957  . CARDIOVERSION N/A 03/07/2016   Procedure: CARDIOVERSION;  Surgeon: Adrian Prows, MD;  Location: [facility];  Service: Cardiovascular;  Laterality: N/A;  . CATARACT EXTRACTION     bilateral  . CHOLECYSTECTOMY    . COLONOSCOPY  1998   Diverticulosis; polyp\  . COLONOSCOPY  09/2002   Diverticulosis, hem  . COLONOSCOPY  12/2007   diverticulosis, polyp  . DEXA  10/2001   osteopenia  . ELECTROPHYSIOLOGIC STUDY N/A 04/03/2016   Procedure: A-Flutter Ablation;  Surgeon: Will Meredith Leeds, MD;  Location: [facility];  Service: Cardiovascular;  Laterality: N/A;  .  ESOPHAGOGASTRODUODENOSCOPY  2004  . Hida scan  01/2001   Negative  . KNEE SURGERY     right knee / 11/2004  . LAMINECTOMY WITH POSTERIOR LATERAL ARTHRODESIS LEVEL 2 N/A 07/31/2017   Procedure: DECOMPRESSIVE LAMINECTOMY LUMABR FOUR-FIVE, LUMBAR FIVE-SACRAL ONE;  Surgeon: Eustace Moore, MD;  Location: [facility];  Service: Neurosurgery;  Laterality: N/A;  . laser surgery for glaucoma Left Sep 21, 2014  . ROTATOR CUFF REPAIR     x 2 /right shoulder  . SKIN CANCER EXCISION     pre-melanoma / on face  . TEE WITHOUT CARDIOVERSION N/A 03/07/2016   Procedure: TRANSESOPHAGEAL ECHOCARDIOGRAM (TEE);  Surgeon: Adrian Prows, MD;  Location: [facility];  Service: Cardiovascular;  Laterality: N/A;  . TOE SURGERY     rt foot     There were no vitals filed for this visit.  Subjective Assessment - 10/07/18 1326    Subjective  Patient continues to cough, reports aobut an hour of coughing in the mornings which is causing some back pain    Currently in Pain?  Yes    Pain Score  5     Pain Location  Back    Aggravating Factors   coughing

## 2018-10-12 ENCOUNTER — Ambulatory Visit: Payer: Medicare Other | Admitting: Physical Therapy

## 2018-10-12 ENCOUNTER — Encounter: Payer: Self-pay | Admitting: Physical Therapy

## 2018-10-12 DIAGNOSIS — M5441 Lumbago with sciatica, right side: Secondary | ICD-10-CM

## 2018-10-12 DIAGNOSIS — M62830 Muscle spasm of back: Secondary | ICD-10-CM

## 2018-10-12 DIAGNOSIS — R262 Difficulty in walking, not elsewhere classified: Secondary | ICD-10-CM

## 2018-10-12 NOTE — Therapy (Signed)
HEMATOCHEZIA 10/23/2010  . OSTEOARTHRITIS, HANDS, BILATERAL 01/24/2010  . Hypothyroidism 08/24/2008  . Vitamin D deficiency 06/06/2008  . Hyperlipidemia 06/06/2008  . MENOPAUSAL SYNDROME 03/29/2008  . Osteopenia 03/29/2008  . ALLERGIC RHINITIS 01/25/2008  . IBS 01/25/2008  . OSTEOARTHRITIS 01/25/2008  . FIBROMYALGIA 01/25/2008    [name], PT 10/12/2018, 1:34 PM  [address] [place], [place], [address] Phone: [phone]   Fax:  [phone]  Name: Linda Mcgrath MRN: [id number] Date of Birth: November 02, 1932  [place] [place], [place], [address] Phone: [phone]   Fax:  [phone]  Physical Therapy Treatment  Patient Details  Name: Linda Mcgrath MRN: [id number] Date of Birth: 1933-01-29 Referring Provider (PT): [name]   Encounter Date: 10/12/2018  PT End of Session - 10/12/18 1329    Visit Number  37    Date for PT Re-Evaluation  10/23/18    PT Start Time  0518    PT Stop Time  1345    PT Time Calculation (min)  49 min    Activity Tolerance  Patient tolerated treatment well    Behavior During Therapy  [facility] for tasks assessed/performed       Past Medical History:  Diagnosis Date  . Allergic rhinitis   . Asthma   . Cataract    Bil/lens implant  . Colon polyp 1999   small polyp  . Diverticulosis   . Dysrhythmia   . Fibromyalgia   . Hyperlipidemia   . Hypothyroidism   . Internal hemorrhoids   . Leg cramps   . Lung nodule    stable LUL 9 mm  . Menopausal syndrome   . Osteoarthritis   . UTI (urinary tract infection)     Past Surgical History:  Procedure Laterality Date  . Abd U/S  11/1998   negative  . Abd U/S  01/2001   gallbladder polyps  . ABDOMINAL HYSTERECTOMY  1975   total-fibroids  . APPENDECTOMY  1975  . BREAST BIOPSY     benign  . BREAST EXCISIONAL BIOPSY Left 1957  . CARDIOVERSION N/A 03/07/2016   Procedure: CARDIOVERSION;  Surgeon: Adrian Prows, MD;  Location: [place];  Service: Cardiovascular;  Laterality: N/A;  . CATARACT EXTRACTION     bilateral  . CHOLECYSTECTOMY    . COLONOSCOPY  1998   Diverticulosis; polyp\  . COLONOSCOPY  09/2002   Diverticulosis, hem  . COLONOSCOPY  12/2007   diverticulosis, polyp  . DEXA  10/2001   osteopenia  . ELECTROPHYSIOLOGIC STUDY N/A 04/03/2016   Procedure: A-Flutter Ablation;  Surgeon: Will Meredith Leeds, MD;  Location: [place] [facility];  Service: Cardiovascular;  Laterality: N/A;  . ESOPHAGOGASTRODUODENOSCOPY  2004  . [place]  scan  01/2001   Negative  . KNEE SURGERY     right knee / 11/2004  . LAMINECTOMY WITH POSTERIOR LATERAL ARTHRODESIS LEVEL 2 N/A 07/31/2017   Procedure: DECOMPRESSIVE LAMINECTOMY LUMABR FOUR-FIVE, LUMBAR FIVE-SACRAL ONE;  Surgeon: Eustace Moore, MD;  Location: [place];  Service: Neurosurgery;  Laterality: N/A;  . laser surgery for glaucoma Left Sep 21, 2014  . ROTATOR CUFF REPAIR     x 2 /right shoulder  . SKIN CANCER EXCISION     pre-melanoma / on face  . TEE WITHOUT CARDIOVERSION N/A 03/07/2016   Procedure: TRANSESOPHAGEAL ECHOCARDIOGRAM (TEE);  Surgeon: Adrian Prows, MD;  Location: [place];  Service: Cardiovascular;  Laterality: N/A;  . TOE SURGERY     rt foot     There were no vitals filed for this visit.  Subjective Assessment - 10/12/18 1324    Subjective  My cough is much better, slept better last night    Currently in Pain?  Yes    Pain Score  4     Pain Location  Back    Pain Orientation  Right;Lower    Aggravating Factors   The coughing really set me back  [place] [address] [place], [place], [address] Phone: [phone]   Fax:  [phone]  Physical Therapy Treatment  Patient Details  Name: Linda Mcgrath MRN: [id number] Date of Birth: 1933-01-29 Referring Provider (PT): [name]   Encounter Date: 10/12/2018  PT End of Session - 10/12/18 1329    Visit Number  37    Date for PT Re-Evaluation  10/23/18    PT Start Time  0518    PT Stop Time  1345    PT Time Calculation (min)  49 min    Activity Tolerance  Patient tolerated treatment well    Behavior During Therapy  [facility] for tasks assessed/performed       Past Medical History:  Diagnosis Date  . Allergic rhinitis   . Asthma   . Cataract    Bil/lens implant  . Colon polyp 1999   small polyp  . Diverticulosis   . Dysrhythmia   . Fibromyalgia   . Hyperlipidemia   . Hypothyroidism   . Internal hemorrhoids   . Leg cramps   . Lung nodule    stable LUL 9 mm  . Menopausal syndrome   . Osteoarthritis   . UTI (urinary tract infection)     Past Surgical History:  Procedure Laterality Date  . Abd U/S  11/1998   negative  . Abd U/S  01/2001   gallbladder polyps  . ABDOMINAL HYSTERECTOMY  1975   total-fibroids  . APPENDECTOMY  1975  . BREAST BIOPSY     benign  . BREAST EXCISIONAL BIOPSY Left 1957  . CARDIOVERSION N/A 03/07/2016   Procedure: CARDIOVERSION;  Surgeon: Adrian Prows, MD;  Location: [place];  Service: Cardiovascular;  Laterality: N/A;  . CATARACT EXTRACTION     bilateral  . CHOLECYSTECTOMY    . COLONOSCOPY  1998   Diverticulosis; polyp\  . COLONOSCOPY  09/2002   Diverticulosis, hem  . COLONOSCOPY  12/2007   diverticulosis, polyp  . DEXA  10/2001   osteopenia  . ELECTROPHYSIOLOGIC STUDY N/A 04/03/2016   Procedure: A-Flutter Ablation;  Surgeon: Will Meredith Leeds, MD;  Location: [place] [facility];  Service: Cardiovascular;  Laterality: N/A;  . ESOPHAGOGASTRODUODENOSCOPY  2004  . H[place]  scan  01/2001   Negative  . KNEE SURGERY     right knee / 11/2004  . LAMINECTOMY WITH POSTERIOR LATERAL ARTHRODESIS LEVEL 2 N/A 07/31/2017   Procedure: DECOMPRESSIVE LAMINECTOMY LUMABR FOUR-FIVE, LUMBAR FIVE-SACRAL ONE;  Surgeon: Eustace Moore, MD;  Location: [place];  Service: Neurosurgery;  Laterality: N/A;  . laser surgery for glaucoma Left Sep 21, 2014  . ROTATOR CUFF REPAIR     x 2 /right shoulder  . SKIN CANCER EXCISION     pre-melanoma / on face  . TEE WITHOUT CARDIOVERSION N/A 03/07/2016   Procedure: TRANSESOPHAGEAL ECHOCARDIOGRAM (TEE);  Surgeon: Adrian Prows, MD;  Location: [place];  Service: Cardiovascular;  Laterality: N/A;  . TOE SURGERY     rt foot     There were no vitals filed for this visit.  Subjective Assessment - 10/12/18 1324    Subjective  My cough is much better, slept better last night    Currently in Pain?  Yes    Pain Score  4     Pain Location  Back    Pain Orientation  Right;Lower    Aggravating Factors   The coughing really set me back

## 2018-10-14 ENCOUNTER — Ambulatory Visit: Payer: Medicare Other | Admitting: Physical Therapy

## 2018-10-14 ENCOUNTER — Encounter: Payer: Self-pay | Admitting: Physical Therapy

## 2018-10-14 DIAGNOSIS — M62830 Muscle spasm of back: Secondary | ICD-10-CM | POA: Diagnosis not present

## 2018-10-14 DIAGNOSIS — R262 Difficulty in walking, not elsewhere classified: Secondary | ICD-10-CM

## 2018-10-14 DIAGNOSIS — M5441 Lumbago with sciatica, right side: Secondary | ICD-10-CM | POA: Diagnosis not present

## 2018-10-14 NOTE — Therapy (Signed)
[address] [place], [place], [address] Phone: [phone]   Fax:  [phone]  Physical Therapy Treatment  Patient Details  Name: SHRIYA AKER MRN: [id number] Date of Birth: Oct 02, 1932 Referring Provider (PT): [name]   Encounter Date: 10/14/2018  PT End of Session - 10/14/18 1346    Visit Number  38    Date for PT Re-Evaluation  10/23/18    PT Start Time  1302    PT Stop Time  1400    PT Time Calculation (min)  58 min    Activity Tolerance  Patient tolerated treatment well    Behavior During Therapy  [facility] for tasks assessed/performed       Past Medical History:  Diagnosis Date  . Allergic rhinitis   . Asthma   . Cataract    Bil/lens implant  . Colon polyp 1999   small polyp  . Diverticulosis   . Dysrhythmia   . Fibromyalgia   . Hyperlipidemia   . Hypothyroidism   . Internal hemorrhoids   . Leg cramps   . Lung nodule    stable LUL 9 mm  . Menopausal syndrome   . Osteoarthritis   . UTI (urinary tract infection)     Past Surgical History:  Procedure Laterality Date  . Abd U/S  11/1998   negative  . Abd U/S  01/2001   gallbladder polyps  . ABDOMINAL HYSTERECTOMY  1975   total-fibroids  . APPENDECTOMY  1975  . BREAST BIOPSY     benign  . BREAST EXCISIONAL BIOPSY Left 1957  . CARDIOVERSION N/A 03/07/2016   Procedure: CARDIOVERSION;  Surgeon: Adrian Prows, MD;  Location: [place];  Service: Cardiovascular;  Laterality: N/A;  . CATARACT EXTRACTION     bilateral  . CHOLECYSTECTOMY    . COLONOSCOPY  1998   Diverticulosis; polyp\  . COLONOSCOPY  09/2002   Diverticulosis, hem  . COLONOSCOPY  12/2007   diverticulosis, polyp  . DEXA  10/2001   osteopenia  . ELECTROPHYSIOLOGIC STUDY N/A 04/03/2016   Procedure: A-Flutter Ablation;  Surgeon: Will Meredith Leeds, MD;  Location: [place] [facility];  Service: Cardiovascular;  Laterality: N/A;  . ESOPHAGOGASTRODUODENOSCOPY  2004  . H[place]  scan  01/2001   Negative  . KNEE SURGERY     right knee / 11/2004  . LAMINECTOMY WITH POSTERIOR LATERAL ARTHRODESIS LEVEL 2 N/A 07/31/2017   Procedure: DECOMPRESSIVE LAMINECTOMY LUMABR FOUR-FIVE, LUMBAR FIVE-SACRAL ONE;  Surgeon: Eustace Moore, MD;  Location: [place];  Service: Neurosurgery;  Laterality: N/A;  . laser surgery for glaucoma Left Sep 21, 2014  . ROTATOR CUFF REPAIR     x 2 /right shoulder  . SKIN CANCER EXCISION     pre-melanoma / on face  . TEE WITHOUT CARDIOVERSION N/A 03/07/2016   Procedure: TRANSESOPHAGEAL ECHOCARDIOGRAM (TEE);  Surgeon: Adrian Prows, MD;  Location: [place];  Service: Cardiovascular;  Laterality: N/A;  . TOE SURGERY     rt foot     There were no vitals filed for this visit.  Subjective Assessment - 10/14/18 1344    Subjective  Patient reports that she is still feeling better less pain, less coughing    Currently in Pain?  Yes    Pain Score  3     Pain Location  Back    Pain Orientation  Right;Lower    Pain Relieving Factors  feeling better  [facility] [place], [place], [address] Phone: [phone]   Fax:  [phone]  Name: BREUNNA NORDMANN MRN: [id number] Date of Birth: 15-Jul-1933  [facility] [place], [place], [address] Phone: [phone]   Fax:  [phone]  Name: BREUNNA NORDMANN MRN: [id number] Date of Birth: 15-Jul-1933

## 2018-10-19 ENCOUNTER — Encounter: Payer: Medicare Other | Admitting: Physical Therapy

## 2018-10-21 ENCOUNTER — Ambulatory Visit: Payer: Medicare Other | Admitting: Physical Therapy

## 2018-10-21 ENCOUNTER — Encounter: Payer: Self-pay | Admitting: Physical Therapy

## 2018-10-21 DIAGNOSIS — R262 Difficulty in walking, not elsewhere classified: Secondary | ICD-10-CM

## 2018-10-21 DIAGNOSIS — M5441 Lumbago with sciatica, right side: Secondary | ICD-10-CM | POA: Diagnosis not present

## 2018-10-21 DIAGNOSIS — M62830 Muscle spasm of back: Secondary | ICD-10-CM | POA: Diagnosis not present

## 2018-10-21 NOTE — Therapy (Signed)
deficiency 10/25/2014  . Lichen planus 38/37/7939  . Encounter for Medicare annual wellness exam 06/17/2013  . Screening mammogram, encounter for 06/15/2012  . Hyperglycemia 06/03/2011  . HYPERTENSION, BENIGN ESSENTIAL 10/23/2010  . CONSTIPATION 10/23/2010  . HEMATOCHEZIA 10/23/2010  . OSTEOARTHRITIS, HANDS, BILATERAL 01/24/2010  . Hypothyroidism 08/24/2008  . Vitamin D deficiency 06/06/2008  . Hyperlipidemia 06/06/2008  . MENOPAUSAL SYNDROME 03/29/2008  . Osteopenia 03/29/2008  . ALLERGIC RHINITIS 01/25/2008  . IBS 01/25/2008  . OSTEOARTHRITIS 01/25/2008  . FIBROMYALGIA 01/25/2008    [name], PT 10/21/2018, 1:51 PM  [address] [place], [place], [address] Phone: [phone]   Fax:  [phone]  Name: MYKIAH SCHMUCK MRN: [id number] Date of Birth: 1933/01/20  [place], [place], [address] Phone: [phone]   Fax:  [phone]  Physical Therapy Treatment  Patient Details  Name: Linda Mcgrath MRN: [id number] Date of Birth: [birth year] Referring Provider (PT): [name]   Encounter Date: 10/21/2018  PT End of Session - 10/21/18 1346    Visit Number  72    PT Start Time  0347    PT Stop Time  1350    PT Time Calculation (min)  48 min    Activity Tolerance  Patient tolerated treatment well    Behavior During Therapy  [facility] for tasks assessed/performed       Past Medical History:  Diagnosis Date  . Allergic rhinitis   . Asthma   . Cataract    Bil/lens implant  . Colon polyp 1999   small polyp  . Diverticulosis   . Dysrhythmia   . Fibromyalgia   . Hyperlipidemia   . Hypothyroidism   . Internal hemorrhoids   . Leg cramps   . Lung nodule    stable LUL 9 mm  . Menopausal syndrome   . Osteoarthritis   . UTI (urinary tract infection)     Past Surgical History:  Procedure Laterality Date  . Abd U/S  11/1998   negative  . Abd U/S  01/2001   gallbladder polyps  . ABDOMINAL HYSTERECTOMY  1975   total-fibroids  . APPENDECTOMY  1975  . BREAST BIOPSY     benign  . BREAST EXCISIONAL BIOPSY Left 1957  . CARDIOVERSION N/A 03/07/2016   Procedure: CARDIOVERSION;  Surgeon: Adrian Prows, MD;  Location: [place];  Service: Cardiovascular;  Laterality: N/A;  . CATARACT EXTRACTION     bilateral  . CHOLECYSTECTOMY    . COLONOSCOPY  1998   Diverticulosis; polyp\  . COLONOSCOPY  09/2002   Diverticulosis, hem  . COLONOSCOPY  12/2007   diverticulosis, polyp  . DEXA  10/2001   osteopenia  . ELECTROPHYSIOLOGIC STUDY N/A 04/03/2016   Procedure: A-Flutter Ablation;  Surgeon: Will Meredith Leeds, MD;  Location: [place] [facility];  Service: Cardiovascular;  Laterality: N/A;  . ESOPHAGOGASTRODUODENOSCOPY  2004  . [place] scan  01/2001   Negative  . KNEE  SURGERY     right knee / 11/2004  . LAMINECTOMY WITH POSTERIOR LATERAL ARTHRODESIS LEVEL 2 N/A 07/31/2017   Procedure: DECOMPRESSIVE LAMINECTOMY LUMABR FOUR-FIVE, LUMBAR FIVE-SACRAL ONE;  Surgeon: Eustace Moore, MD;  Location: [place];  Service: Neurosurgery;  Laterality: N/A;  . laser surgery for glaucoma Left Sep 21, 2014  . ROTATOR CUFF REPAIR     x 2 /right shoulder  . SKIN CANCER EXCISION     pre-melanoma / on face  . TEE WITHOUT CARDIOVERSION N/A 03/07/2016   Procedure: TRANSESOPHAGEAL ECHOCARDIOGRAM (TEE);  Surgeon: Adrian Prows, MD;  Location: [place];  Service: Cardiovascular;  Laterality: N/A;  . TOE SURGERY     rt foot     There were no vitals filed for this visit.  Subjective Assessment - 10/21/18 1343    Subjective  "I am doing very well"    Currently in Pain?  Yes    Pain Score  1     Pain Location  Back    Pain Relieving Factors  treatment "really helped"                       [facility] Adult PT Treatment/Exercise - 10/21/18 0001  [place], [place], [address] Phone: [phone]   Fax:  [phone]  Physical Therapy Treatment  Patient Details  Name: Linda Mcgrath MRN: [id number] Date of Birth: [birth year] Referring Provider (PT): [name]   Encounter Date: 10/21/2018  PT End of Session - 10/21/18 1346    Visit Number  72    PT Start Time  0347    PT Stop Time  1350    PT Time Calculation (min)  48 min    Activity Tolerance  Patient tolerated treatment well    Behavior During Therapy  [facility] for tasks assessed/performed       Past Medical History:  Diagnosis Date  . Allergic rhinitis   . Asthma   . Cataract    Bil/lens implant  . Colon polyp 1999   small polyp  . Diverticulosis   . Dysrhythmia   . Fibromyalgia   . Hyperlipidemia   . Hypothyroidism   . Internal hemorrhoids   . Leg cramps   . Lung nodule    stable LUL 9 mm  . Menopausal syndrome   . Osteoarthritis   . UTI (urinary tract infection)     Past Surgical History:  Procedure Laterality Date  . Abd U/S  11/1998   negative  . Abd U/S  01/2001   gallbladder polyps  . ABDOMINAL HYSTERECTOMY  1975   total-fibroids  . APPENDECTOMY  1975  . BREAST BIOPSY     benign  . BREAST EXCISIONAL BIOPSY Left 1957  . CARDIOVERSION N/A 03/07/2016   Procedure: CARDIOVERSION;  Surgeon: Adrian Prows, MD;  Location: [place];  Service: Cardiovascular;  Laterality: N/A;  . CATARACT EXTRACTION     bilateral  . CHOLECYSTECTOMY    . COLONOSCOPY  1998   Diverticulosis; polyp\  . COLONOSCOPY  09/2002   Diverticulosis, hem  . COLONOSCOPY  12/2007   diverticulosis, polyp  . DEXA  10/2001   osteopenia  . ELECTROPHYSIOLOGIC STUDY N/A 04/03/2016   Procedure: A-Flutter Ablation;  Surgeon: Will Meredith Leeds, MD;  Location: [place] [facility];  Service: Cardiovascular;  Laterality: N/A;  . ESOPHAGOGASTRODUODENOSCOPY  2004  . Hida scan  01/2001   Negative  . KNEE  SURGERY     right knee / 11/2004  . LAMINECTOMY WITH POSTERIOR LATERAL ARTHRODESIS LEVEL 2 N/A 07/31/2017   Procedure: DECOMPRESSIVE LAMINECTOMY LUMABR FOUR-FIVE, LUMBAR FIVE-SACRAL ONE;  Surgeon: Eustace Moore, MD;  Location: [place];  Service: Neurosurgery;  Laterality: N/A;  . laser surgery for glaucoma Left Sep 21, 2014  . ROTATOR CUFF REPAIR     x 2 /right shoulder  . SKIN CANCER EXCISION     pre-melanoma / on face  . TEE WITHOUT CARDIOVERSION N/A 03/07/2016   Procedure: TRANSESOPHAGEAL ECHOCARDIOGRAM (TEE);  Surgeon: Adrian Prows, MD;  Location: [place];  Service: Cardiovascular;  Laterality: N/A;  . TOE SURGERY     rt foot     There were no vitals filed for this visit.  Subjective Assessment - 10/21/18 1343    Subjective  "I am doing very well"    Currently in Pain?  Yes    Pain Score  1     Pain Location  Back    Pain Relieving Factors  treatment "really helped"                       [facility] Adult PT Treatment/Exercise - 10/21/18 0001

## 2018-10-29 ENCOUNTER — Other Ambulatory Visit: Payer: Self-pay | Admitting: Family Medicine

## 2018-10-29 DIAGNOSIS — Z1231 Encounter for screening mammogram for malignant neoplasm of breast: Secondary | ICD-10-CM

## 2018-11-06 ENCOUNTER — Other Ambulatory Visit: Payer: Self-pay | Admitting: Family Medicine

## 2018-11-13 ENCOUNTER — Other Ambulatory Visit: Payer: Self-pay

## 2018-11-13 ENCOUNTER — Ambulatory Visit (INDEPENDENT_AMBULATORY_CARE_PROVIDER_SITE_OTHER): Payer: Medicare Other | Admitting: Family Medicine

## 2018-11-13 ENCOUNTER — Encounter: Payer: Self-pay | Admitting: Family Medicine

## 2018-11-13 VITALS — BP 138/82 | HR 70 | Temp 97.6°F | Ht 63.0 in | Wt 176.5 lb

## 2018-11-13 DIAGNOSIS — R197 Diarrhea, unspecified: Secondary | ICD-10-CM

## 2018-11-13 DIAGNOSIS — R1030 Lower abdominal pain, unspecified: Secondary | ICD-10-CM | POA: Diagnosis not present

## 2018-11-13 LAB — CBC WITH DIFFERENTIAL/PLATELET
Basophils Absolute: 0 10*3/uL (ref 0.0–0.1)
Basophils Relative: 0.5 % (ref 0.0–3.0)
Eosinophils Absolute: 0.2 10*3/uL (ref 0.0–0.7)
Eosinophils Relative: 2.6 % (ref 0.0–5.0)
HCT: 44.9 % (ref 36.0–46.0)
Hemoglobin: 15.3 g/dL — ABNORMAL HIGH (ref 12.0–15.0)
Lymphocytes Relative: 34.6 % (ref 12.0–46.0)
Lymphs Abs: 2.9 10*3/uL (ref 0.7–4.0)
MCHC: 34 g/dL (ref 30.0–36.0)
MCV: 95.6 fl (ref 78.0–100.0)
Monocytes Absolute: 0.8 10*3/uL (ref 0.1–1.0)
Monocytes Relative: 9.8 % (ref 3.0–12.0)
Neutro Abs: 4.4 10*3/uL (ref 1.4–7.7)
Neutrophils Relative %: 52.5 % (ref 43.0–77.0)
Platelets: 264 10*3/uL (ref 150.0–400.0)
RBC: 4.7 Mil/uL (ref 3.87–5.11)
RDW: 13.1 % (ref 11.5–15.5)
WBC: 8.4 10*3/uL (ref 4.0–10.5)

## 2018-11-13 LAB — COMPREHENSIVE METABOLIC PANEL
ALT: 12 U/L (ref 0–35)
AST: 15 U/L (ref 0–37)
Albumin: 4 g/dL (ref 3.5–5.2)
Alkaline Phosphatase: 67 U/L (ref 39–117)
BUN: 23 mg/dL (ref 6–23)
CO2: 27 mEq/L (ref 19–32)
Calcium: 9.2 mg/dL (ref 8.4–10.5)
Chloride: 104 mEq/L (ref 96–112)
Creatinine, Ser: 0.92 mg/dL (ref 0.40–1.20)
GFR: 58.21 mL/min — ABNORMAL LOW (ref >60.00)
Glucose, Bld: 80 mg/dL (ref 70–99)
Potassium: 4.4 mEq/L (ref 3.5–5.1)
Sodium: 139 mEq/L (ref 135–145)
Total Bilirubin: 0.7 mg/dL (ref 0.2–1.2)
Total Protein: 6.8 g/dL (ref 6.0–8.3)

## 2018-11-13 LAB — POC HEMOCCULT BLD/STL (OFFICE/1-CARD/DIAGNOSTIC)
Card #1 Date: 3
Fecal Occult Blood, POC: POSITIVE — AB

## 2018-11-13 LAB — LIPASE: Lipase: 17 U/L (ref 11.0–59.0)

## 2018-11-13 MED ORDER — METRONIDAZOLE 500 MG PO TABS: 500.0000 mg | ORAL_TABLET | Freq: Three times a day (TID) | ORAL | 0 refills | Status: DC

## 2018-11-13 NOTE — Progress Notes (Signed)
BP 138/82 (BP Location: Left Arm, Patient Position: Sitting, Cuff Size: Normal)    Pulse 70    Temp 97.6 F (36.4 C) (Oral)    Ht 5\' 3"  (1.6 m)    Wt 176 lb 8 oz (80.1 kg)    LMP 08/26/1969    SpO2 97%    BMI 31.27 kg/m    CC: terrible pain in stomach Subjective:    Patient ID: Linda Mcgrath, female    DOB: [birth year], 85 y.o.   MRN: [id number]  HPI: Linda Mcgrath is a 85 y.o. female presenting on 11/13/2018 for Abdominal Pain (C/o low abd pain and diarrhea. Says stool is very dark in color. Denies N/V. Sxs started 11/08/18. )    5 d h/o lower abdominal pain associated with dark looking stools, earlier in the week dark tarry, but now better, somewhat malodorous. Acutely worse 2 days ago, pain persists but not as bad as on Wednesday.  No fevers/chills, nausea/vomiting. No dyspnea, dizziness/lightheadedness. No noted red blood in stool.  No new urinary symptoms  No sick contacts at home.  No new foods or restaurants or medicines.   She has tried stool softeners with benefit.   Last year had c diff treated with flagyl.   Seen late January for acute bronchitis treated with azithromycin course.   Colonoscopy 7/14 with adenomatous polyp- moderate diverticulosis as well. Aged out after this. Brother had colon cancer.      Relevant past medical, surgical, family and social history reviewed and updated as indicated. Interim medical history since our last visit reviewed. Allergies and medications reviewed and updated. Outpatient Medications Prior to Visit  Medication Sig Dispense Refill   Ascorbic Acid (VITAMIN C) 1000 MG tablet Take 1,000 mg by mouth daily.       B Complex Vitamins (VITAMIN B COMPLEX PO) Take 1 tablet by mouth daily.     Biotin 10 MG TABS Take 1 tablet by mouth every morning.      Calcium Citrate-Vitamin D (CALCIUM CITRATE + D) 250-200 MG-UNIT TABS Take 1 tablet by mouth daily.     cholecalciferol (VITAMIN D) 1000 units tablet Take 1,000 Units by  mouth daily.     cyclobenzaprine (FLEXERIL) 10 MG tablet Take 0.5-1 tablets (5-10 mg total) by mouth at bedtime as needed. 90 tablet 1   estradiol (ESTRACE) 1 MG tablet TAKE 1 TABLET BY MOUTH EVERY DAY 90 tablet 0   famotidine (PEPCID) 20 MG tablet Take 1 tablet (20 mg total) by mouth 2 (two) times daily. 60 tablet 3   Fexofenadine HCl (MUCINEX ALLERGY PO) Take 1 tablet by mouth daily as needed (Allergies).      fluticasone (FLONASE) 50 MCG/ACT nasal spray PLACE 2 SPRAYS INTO BOTH NOSTRILS DAILY AS NEEDED FOR ALLERGIES. 16 g 2   folic acid (FOLVITE) 1 MG tablet Take 1 mg by mouth daily.     Homeopathic Products (LEG CRAMPS PM SL) Place 2 tablets under the tongue at bedtime.     levothyroxine (SYNTHROID, LEVOTHROID) 88 MCG tablet Take 1 tablet (88 mcg total) by mouth daily with breakfast. 90 tablet 3   MAGNESIUM-OXIDE PO Take 1 tablet by mouth daily.     montelukast (SINGULAIR) 10 MG tablet Take 1 tablet (10 mg total) by mouth at bedtime. 90 tablet 3   Multiple Vitamin (MULTIVITAMIN) capsule Take 1 capsule by mouth daily.       TURMERIC PO Take 1 capsule by mouth 2 (two) times daily.  vitamin B-12 (CYANOCOBALAMIN) 500 MCG tablet Take 500 mcg by mouth daily.     loratadine (CLARITIN) 10 MG tablet Take 10 mg by mouth daily as needed for allergies.      azithromycin (ZITHROMAX Z-PAK) 250 MG tablet Take 2 pills by mouth today and then 1 pill daily for 4 days 6 tablet 0   AZO-CRANBERRY PO Take 1 capsule by mouth 2 (two) times daily.     benzonatate (TESSALON) 200 MG capsule Take 1 capsule (200 mg total) by mouth 3 (three) times daily as needed for cough. Do not bite pill 30 capsule 1   No facility-administered medications prior to visit.      Per HPI unless specifically indicated in ROS section below Review of Systems Objective:    BP 138/82 (BP Location: Left Arm, Patient Position: Sitting, Cuff Size: Normal)    Pulse 70    Temp 97.6 F (36.4 C) (Oral)    Ht 5\' 3"  (1.6 m)     Wt 176 lb 8 oz (80.1 kg)    LMP 08/26/1969    SpO2 97%    BMI 31.27 kg/m   Wt Readings from Last 3 Encounters:  11/13/18 176 lb 8 oz (80.1 kg)  09/21/18 180 lb 8 oz (81.9 kg)  07/03/18 180 lb 8 oz (81.9 kg)    Physical Exam Vitals signs and nursing note reviewed.  Constitutional:      Appearance: Normal appearance.  HENT:     Mouth/Throat:     Mouth: Mucous membranes are moist.     Pharynx: Oropharynx is clear. No posterior oropharyngeal erythema.  Cardiovascular:     Rate and Rhythm: Normal rate and regular rhythm.     Pulses: Normal pulses.     Heart sounds: Normal heart sounds. No murmur.  Pulmonary:     Effort: Pulmonary effort is normal. No respiratory distress.     Breath sounds: Normal breath sounds. No wheezing, rhonchi or rales.  Abdominal:     General: Abdomen is flat. Bowel sounds are normal. There is no distension.     Palpations: Abdomen is soft. There is no mass.     Tenderness: There is generalized abdominal tenderness (mild). There is no right CVA tenderness, left CVA tenderness, guarding or rebound.     Hernia: No hernia is present.     Comments: Worse discomfort to palpation at lower abdomen  Neurological:     Mental Status: She is alert.       Results for orders placed or performed in visit on 11/13/18  CBC with Differential/Platelet  Result Value Ref Range   WBC 8.4 4.0 - 10.5 K/uL   RBC 4.70 3.87 - 5.11 Mil/uL   Hemoglobin 15.3 (H) 12.0 - 15.0 g/dL   HCT 40.9 81.1 - 91.4 %   MCV 95.6 78.0 - 100.0 fl   MCHC 34.0 30.0 - 36.0 g/dL   RDW 78.2 95.6 - 21.3 %   Platelets 264.0 150.0 - 400.0 K/uL   Neutrophils Relative % 52.5 43.0 - 77.0 %   Lymphocytes Relative 34.6 12.0 - 46.0 %   Monocytes Relative 9.8 3.0 - 12.0 %   Eosinophils Relative 2.6 0.0 - 5.0 %   Basophils Relative 0.5 0.0 - 3.0 %   Neutro Abs 4.4 1.4 - 7.7 K/uL   Lymphs Abs 2.9 0.7 - 4.0 K/uL   Monocytes Absolute 0.8 0.1 - 1.0 K/uL   Eosinophils Absolute 0.2 0.0 - 0.7 K/uL   Basophils  Absolute 0.0 0.0 -  0.1 K/uL  Comprehensive metabolic panel  Result Value Ref Range   Sodium 139 135 - 145 mEq/L   Potassium 4.4 3.5 - 5.1 mEq/L   Chloride 104 96 - 112 mEq/L   CO2 27 19 - 32 mEq/L   Glucose, Bld 80 70 - 99 mg/dL   BUN 23 6 - 23 mg/dL   Creatinine, Ser 4.09 0.40 - 1.20 mg/dL   Total Bilirubin 0.7 0.2 - 1.2 mg/dL   Alkaline Phosphatase 67 39 - 117 U/L   AST 15 0 - 37 U/L   ALT 12 0 - 35 U/L   Total Protein 6.8 6.0 - 8.3 g/dL   Albumin 4.0 3.5 - 5.2 g/dL   Calcium 9.2 8.4 - 81.1 mg/dL   GFR 91.47 (L) >82.95 mL/min  Lipase  Result Value Ref Range   Lipase 17.0 11.0 - 59.0 U/L  POC Hemoccult Bld/Stl (1-Cd Office Dx)  Result Value Ref Range   Card #1 Date 3 20 20     Fecal Occult Blood, POC Positive (A) Negative   DG Chest 2 View CLINICAL DATA:  Productive cough  EXAM: CHEST - 2 VIEW  COMPARISON:  07/25/2017  FINDINGS: The heart is normal in size. There is volume loss in the left lung. Scarring in the left mid and lower lung zone is stable. Chronic pleural changes at the left base. Stable left upper lobe nodule right lung remains clear. No pneumothorax.  IMPRESSION: There are chronic changes as described above. No acute cardiopulmonary disease.  Electronically Signed   By: Jolaine Click M.D.   On: 10/02/2018 12:50   Assessment & Plan:   Problem List Items Addressed This Visit    Diarrhea - Primary    1 wk episode of diarrhea and lower abd pain in h/o c diff, diverticulosis and IBS. No fever, no noted blood in stool. Staying well hydrated. Hemoccult in office positive for blood. Anticipate diverticulitis. Will check labs (CBC, CMP, lipase) and C diff stool test, start flagyl to cover C diff. Red flags to seek urgent care over weekend provided. Pt agrees with plan. Not on blood thinner. Difficult abx selection given multiple drug allergies (cipro, PCN, sulfa).       Relevant Orders   POC Hemoccult Bld/Stl (1-Cd Office Dx) (Completed)   CBC with  Differential/Platelet (Completed)   Comprehensive metabolic panel (Completed)   Lipase (Completed)   C. difficile GDH and Toxin A/B    Other Visit Diagnoses    Lower abdominal pain       Relevant Orders   CBC with Differential/Platelet (Completed)   Comprehensive metabolic panel (Completed)   Lipase (Completed)       Meds ordered this encounter  Medications   DISCONTD: metroNIDAZOLE (FLAGYL) 500 MG tablet    Sig: Take 1 tablet (500 mg total) by mouth 3 (three) times daily.    Dispense:  21 tablet    Refill:  0   metroNIDAZOLE (FLAGYL) 500 MG tablet    Sig: Take 1 tablet (500 mg total) by mouth 3 (three) times daily.    Dispense:  30 tablet    Refill:  0    Use this duration   Orders Placed This Encounter  Procedures   CBC with Differential/Platelet   Comprehensive metabolic panel   Lipase   C. difficile GDH and Toxin A/B    Standing Status:   Future    Standing Expiration Date:   11/13/2019   POC Hemoccult Bld/Stl (1-Cd Office Dx)  Patient Instructions  Labs today - start flagyl after you collect stool sample. Stool collection for C diff infection. Depending on results, we may send you back to Dr Marina Goodell.  Drink plenty of water and fluids to ensure you're staying well hydrated. Bland diet for the next several days - bananas, rice, applesauce, toast.  If any fever, or dizziness, passing out, or worsening symptoms, seek care over the weekend.    Follow up plan: No follow-ups on file.  Eustaquio Boyden, MD

## 2018-11-13 NOTE — Patient Instructions (Signed)
Labs today - start flagyl after you collect stool sample. Stool collection for C diff infection. Depending on results, we may send you back to Dr Henrene Pastor.  Drink plenty of water and fluids to ensure you're staying well hydrated. Bland diet for the next several days - bananas, rice, applesauce, toast.  If any fever, or dizziness, passing out, or worsening symptoms, seek care over the weekend.

## 2018-11-14 NOTE — Assessment & Plan Note (Addendum)
1 wk episode of diarrhea and lower abd pain in h/o c diff, diverticulosis and IBS. No fever, no noted blood in stool. Staying well hydrated. Hemoccult in office positive for blood. Anticipate diverticulitis. Will check labs (CBC, CMP, lipase) and C diff stool test, start flagyl to cover C diff. Red flags to seek urgent care over weekend provided. Pt agrees with plan. Not on blood thinner. Difficult abx selection given multiple drug allergies (cipro, PCN, sulfa).

## 2018-11-16 DIAGNOSIS — R197 Diarrhea, unspecified: Secondary | ICD-10-CM | POA: Diagnosis not present

## 2018-11-16 NOTE — Addendum Note (Signed)
Addended by: Ellamae Sia on: 11/16/2018 09:06 AM   Modules accepted: Orders

## 2018-11-17 LAB — C. DIFFICILE GDH AND TOXIN A/B
GDH ANTIGEN: NOT DETECTED
MICRO NUMBER:: 345511
SPECIMEN QUALITY:: ADEQUATE
TOXIN A AND B: NOT DETECTED

## 2018-11-19 ENCOUNTER — Ambulatory Visit: Payer: Medicare Other

## 2018-11-24 ENCOUNTER — Encounter: Payer: Medicare Other | Admitting: Family Medicine

## 2018-12-11 ENCOUNTER — Ambulatory Visit: Payer: Medicare Other

## 2018-12-22 ENCOUNTER — Other Ambulatory Visit: Payer: Self-pay | Admitting: Family Medicine

## 2019-01-25 ENCOUNTER — Other Ambulatory Visit: Payer: Self-pay

## 2019-01-25 ENCOUNTER — Ambulatory Visit
Admission: RE | Admit: 2019-01-25 | Discharge: 2019-01-25 | Disposition: A | Discharge status: Home / Self Care | Payer: Medicare Other | Source: Ambulatory Visit | Attending: Family Medicine | Admitting: Family Medicine

## 2019-01-25 DIAGNOSIS — Z1231 Encounter for screening mammogram for malignant neoplasm of breast: Secondary | ICD-10-CM | POA: Diagnosis not present

## 2019-01-26 ENCOUNTER — Telehealth: Payer: Self-pay | Admitting: Family Medicine

## 2019-01-26 NOTE — Telephone Encounter (Signed)
No -just watch the site for worsening redness/ size or target "bullseye" shape Also rash anywhere/fever or malaise Keep [place] posted

## 2019-01-26 NOTE — Telephone Encounter (Signed)
Pt notified of Dr. Marliss Coots instructions and comments and verbalized understanding

## 2019-01-26 NOTE — Telephone Encounter (Signed)
Patient called this morning stating she pulled a deer tick off her leg.  She stated that there is no rash only slightly itchy.  And just a little red from where they retrieved the tick. Patient wanted a message sent back to let you know and to also see if she needs to be seen or have a medication sent in as precaution.   PHONE- [phone]

## 2019-02-02 ENCOUNTER — Other Ambulatory Visit: Payer: Self-pay | Admitting: Family Medicine

## 2019-02-15 DIAGNOSIS — H401131 Primary open-angle glaucoma, bilateral, mild stage: Secondary | ICD-10-CM | POA: Diagnosis not present

## 2019-03-19 ENCOUNTER — Other Ambulatory Visit: Payer: Self-pay | Admitting: Family Medicine

## 2019-03-19 ENCOUNTER — Telehealth: Payer: Self-pay | Admitting: Family Medicine

## 2019-03-19 NOTE — Telephone Encounter (Signed)
Opened in error

## 2019-03-22 ENCOUNTER — Telehealth: Payer: Self-pay | Admitting: Family Medicine

## 2019-03-22 DIAGNOSIS — R7303 Prediabetes: Secondary | ICD-10-CM

## 2019-03-22 DIAGNOSIS — E559 Vitamin D deficiency, unspecified: Secondary | ICD-10-CM

## 2019-03-22 DIAGNOSIS — I10 Essential (primary) hypertension: Secondary | ICD-10-CM

## 2019-03-22 DIAGNOSIS — E039 Hypothyroidism, unspecified: Secondary | ICD-10-CM

## 2019-03-22 NOTE — Telephone Encounter (Signed)
-----   Message from Ellamae Sia sent at 03/22/2019 10:18 AM EDT ----- Regarding: Lab orders for Tuesday, 7.28.20 Patient is scheduled for CPX labs, please order future labs, Thanks , Karna Christmas

## 2019-03-23 ENCOUNTER — Ambulatory Visit: Payer: Medicare Other

## 2019-03-23 ENCOUNTER — Other Ambulatory Visit (INDEPENDENT_AMBULATORY_CARE_PROVIDER_SITE_OTHER): Payer: Medicare Other

## 2019-03-23 DIAGNOSIS — I10 Essential (primary) hypertension: Secondary | ICD-10-CM | POA: Diagnosis not present

## 2019-03-23 DIAGNOSIS — E039 Hypothyroidism, unspecified: Secondary | ICD-10-CM

## 2019-03-23 DIAGNOSIS — R7303 Prediabetes: Secondary | ICD-10-CM

## 2019-03-23 DIAGNOSIS — E559 Vitamin D deficiency, unspecified: Secondary | ICD-10-CM | POA: Diagnosis not present

## 2019-03-23 LAB — CBC WITH DIFFERENTIAL/PLATELET
Basophils Absolute: 0.1 10*3/uL (ref 0.0–0.1)
Basophils Relative: 1 % (ref 0.0–3.0)
Eosinophils Absolute: 0.2 10*3/uL (ref 0.0–0.7)
Eosinophils Relative: 2.8 % (ref 0.0–5.0)
HCT: 45.7 % (ref 36.0–46.0)
Hemoglobin: 15.3 g/dL — ABNORMAL HIGH (ref 12.0–15.0)
Lymphocytes Relative: 44.7 % (ref 12.0–46.0)
Lymphs Abs: 2.8 10*3/uL (ref 0.7–4.0)
MCHC: 33.5 g/dL (ref 30.0–36.0)
MCV: 98.2 fl (ref 78.0–100.0)
Monocytes Absolute: 0.5 10*3/uL (ref 0.1–1.0)
Monocytes Relative: 7.2 % (ref 3.0–12.0)
Neutro Abs: 2.8 10*3/uL (ref 1.4–7.7)
Neutrophils Relative %: 44.3 % (ref 43.0–77.0)
Platelets: 244 10*3/uL (ref 150.0–400.0)
RBC: 4.66 Mil/uL (ref 3.87–5.11)
RDW: 13 % (ref 11.5–15.5)
WBC: 6.3 10*3/uL (ref 4.0–10.5)

## 2019-03-23 LAB — COMPREHENSIVE METABOLIC PANEL
ALT: 14 U/L (ref 0–35)
AST: 19 U/L (ref 0–37)
Albumin: 4.1 g/dL (ref 3.5–5.2)
Alkaline Phosphatase: 66 U/L (ref 39–117)
BUN: 25 mg/dL — ABNORMAL HIGH (ref 6–23)
CO2: 28 mEq/L (ref 19–32)
Calcium: 9.7 mg/dL (ref 8.4–10.5)
Chloride: 104 mEq/L (ref 96–112)
Creatinine, Ser: 1.2 mg/dL (ref 0.40–1.20)
GFR: 42.8 mL/min — ABNORMAL LOW (ref >60.00)
Glucose, Bld: 93 mg/dL (ref 70–99)
Potassium: 4.8 mEq/L (ref 3.5–5.1)
Sodium: 140 mEq/L (ref 135–145)
Total Bilirubin: 0.8 mg/dL (ref 0.2–1.2)
Total Protein: 6.7 g/dL (ref 6.0–8.3)

## 2019-03-23 LAB — LIPID PANEL
Cholesterol: 227 mg/dL — ABNORMAL HIGH (ref 0–200)
HDL: 68.4 mg/dL (ref >39.00)
LDL Cholesterol: 128 mg/dL — ABNORMAL HIGH (ref 0–99)
NonHDL: 158.49
Total CHOL/HDL Ratio: 3
Triglycerides: 153 mg/dL — ABNORMAL HIGH (ref 0.0–149.0)
VLDL: 30.6 mg/dL (ref 0.0–40.0)

## 2019-03-23 LAB — VITAMIN D 25 HYDROXY (VIT D DEFICIENCY, FRACTURES): VITD: 37.22 ng/mL (ref 30.00–100.00)

## 2019-03-23 LAB — TSH: TSH: 2.81 u[IU]/mL (ref 0.35–4.50)

## 2019-03-23 LAB — HEMOGLOBIN A1C: Hgb A1c MFr Bld: 5.1 % (ref 4.6–6.5)

## 2019-03-25 ENCOUNTER — Other Ambulatory Visit: Payer: Self-pay

## 2019-03-25 ENCOUNTER — Ambulatory Visit (INDEPENDENT_AMBULATORY_CARE_PROVIDER_SITE_OTHER): Payer: Medicare Other | Admitting: Family Medicine

## 2019-03-25 ENCOUNTER — Encounter: Payer: Self-pay | Admitting: Family Medicine

## 2019-03-25 VITALS — BP 135/80 | HR 57 | Temp 98.2°F | Ht 62.5 in | Wt 181.4 lb

## 2019-03-25 DIAGNOSIS — M8589 Other specified disorders of bone density and structure, multiple sites: Secondary | ICD-10-CM | POA: Diagnosis not present

## 2019-03-25 DIAGNOSIS — Z981 Arthrodesis status: Secondary | ICD-10-CM

## 2019-03-25 DIAGNOSIS — N951 Menopausal and female climacteric states: Secondary | ICD-10-CM | POA: Diagnosis not present

## 2019-03-25 DIAGNOSIS — E2839 Other primary ovarian failure: Secondary | ICD-10-CM

## 2019-03-25 DIAGNOSIS — R7303 Prediabetes: Secondary | ICD-10-CM

## 2019-03-25 DIAGNOSIS — E039 Hypothyroidism, unspecified: Secondary | ICD-10-CM | POA: Diagnosis not present

## 2019-03-25 DIAGNOSIS — E7800 Pure hypercholesterolemia, unspecified: Secondary | ICD-10-CM | POA: Diagnosis not present

## 2019-03-25 DIAGNOSIS — M545 Low back pain: Secondary | ICD-10-CM | POA: Diagnosis not present

## 2019-03-25 DIAGNOSIS — Z0000 Encounter for general adult medical examination without abnormal findings: Secondary | ICD-10-CM | POA: Diagnosis not present

## 2019-03-25 DIAGNOSIS — I10 Essential (primary) hypertension: Secondary | ICD-10-CM | POA: Diagnosis not present

## 2019-03-25 DIAGNOSIS — E6609 Other obesity due to excess calories: Secondary | ICD-10-CM | POA: Diagnosis not present

## 2019-03-25 DIAGNOSIS — E559 Vitamin D deficiency, unspecified: Secondary | ICD-10-CM

## 2019-03-25 DIAGNOSIS — G8929 Other chronic pain: Secondary | ICD-10-CM

## 2019-03-25 DIAGNOSIS — Z6832 Body mass index (BMI) 32.0-32.9, adult: Secondary | ICD-10-CM

## 2019-03-25 MED ORDER — ESTRADIOL 1 MG PO TABS: 1.0000 mg | ORAL_TABLET | Freq: Every day | ORAL | 3 refills | Status: DC

## 2019-03-25 MED ORDER — LEVOTHYROXINE SODIUM 88 MCG PO TABS: 88.0000 ug | ORAL_TABLET | Freq: Every day | ORAL | 3 refills | Status: DC

## 2019-03-25 NOTE — Assessment & Plan Note (Signed)
bp in fair control at this time  BP Readings from Last 1 Encounters:  03/25/19 135/80   No changes needed Most recent labs reviewed  Disc lifstyle change with low sodium diet and exercise

## 2019-03-25 NOTE — Assessment & Plan Note (Signed)
Pt chooses to stay on estrace Disc benefits and risks (breast cancer /blood clots) in detail

## 2019-03-25 NOTE — Assessment & Plan Note (Signed)
Disc goals for lipids and reasons to control them Rev last labs with pt Rev low sat fat diet in detail LDL up  Rev diet-will cut back on red meat

## 2019-03-25 NOTE — Progress Notes (Signed)
Subjective:    Patient ID: Linda Mcgrath, female    DOB: [birth year], 85 y.o.   MRN: [id number]  HPI Here for amw and also annual review of chronic medical problems   I have personally reviewed the Medicare Annual Wellness questionnaire and have noted 1. The patient's medical and social history 2. Their use of alcohol, tobacco or illicit drugs 3. Their current medications and supplements 4. The patient's functional ability including ADL's, fall risks, home safety risks and hearing or visual             impairment. 5. Diet and physical activities 6. Evidence for depression or mood disorders  The patients weight, height, BMI have been recorded in the chart and visual acuity is per eye clinic.  I have made referrals, counseling and provided education to the patient based review of the above and I have provided the pt with a written personalized care plan for preventive services. Reviewed and updated provider list, see scanned forms.  See scanned forms.  Routine anticipatory guidance given to patient.  See health maintenance. Colon cancer screening  Colonoscopy 7/14 -had adenoma but recall was not recommended due to age  Fecal occult blood was pos in 3/20 -having hemorrhoid issues with diarrhea then  wants to do ifob  Breast cancer screening-mammogram 6/20 Self breast exam-no lumps  Flu vaccine 9/19 -plans on it  Tetanus vaccine   Tdap 3/16 Pneumovax-completed  Zoster vaccine  zostavax 8/09 -wants shingrix vaccine  dexa 4/18  Osteopenia  Falls-none  Fractures -none  Supplements -taking D and ca  On estrace  D level is 37.2  (was in the 50s last time)  Exercise -limited by back pain since her lumbar fusion (PT helped)  Wants to go back to  PT She walks around yard and to the mailbox  Advance directive-she has living will and poa  Cognitive function addressed- see scanned forms- and if abnormal then additional documentation follows.  occ forgets a world or misplaces  something Keeps a journal / very organized  Otherwise quite good  Does not get confused or lost   PMH and SH reviewed  Meds, vitals, and allergies reviewed.   ROS: See HPI.  Otherwise negative.    Weight  Wt Readings from Last 3 Encounters:  03/25/19 181 lb 6 oz (82.3 kg)  11/13/18 176 lb 8 oz (80.1 kg)  09/21/18 180 lb 8 oz (81.9 kg)  exercise is limited due to back surgery  Eats well  Limits food at night  No junk food  Lots of water  32.65 kg/m   PHQ: Got down for a while loosing son /dealing with his bills etc   Hearing/vision   Hearing Screening   125Hz 250Hz 500Hz 1000Hz 2000Hz 3000Hz 4000Hz 6000Hz 8000Hz  Right ear:   87 40 40  40    Left ear:   27 40 40  40    Vision Screening Comments: Pt had eye exam on 02/15/19 with Dr. Ellie Lunch    She still has a lot of burping  Once in a while she has a little pain in R side - better after bms  No more diarrhea (has IBS)  She takes fiber supplement pills -works very well for her   Grief - lost son to CHF unexpectedly -grief/getting by    bp is stable today  No cp or palpitations or headaches or edema  No side effects to medicines  BP Readings from Last 3 Encounters:  03/25/19 Marland Kitchen)  Subjective:    Patient ID: Linda Mcgrath, female    DOB: [birth year], 85 y.o.   MRN: [id number]  HPI Here for amw and also annual review of chronic medical problems   I have personally reviewed the Medicare Annual Wellness questionnaire and have noted 1. The patient's medical and social history 2. Their use of alcohol, tobacco or illicit drugs 3. Their current medications and supplements 4. The patient's functional ability including ADL's, fall risks, home safety risks and hearing or visual             impairment. 5. Diet and physical activities 6. Evidence for depression or mood disorders  The patients weight, height, BMI have been recorded in the chart and visual acuity is per eye clinic.  I have made referrals, counseling and provided education to the patient based review of the above and I have provided the pt with a written personalized care plan for preventive services. Reviewed and updated provider list, see scanned forms.  See scanned forms.  Routine anticipatory guidance given to patient.  See health maintenance. Colon cancer screening  Colonoscopy 7/14 -had adenoma but recall was not recommended due to age  Fecal occult blood was pos in 3/20 -having hemorrhoid issues with diarrhea then  wants to do ifob  Breast cancer screening-mammogram 6/20 Self breast exam-no lumps  Flu vaccine 9/19 -plans on it  Tetanus vaccine   Tdap 3/16 Pneumovax-completed  Zoster vaccine  zostavax 8/09 -wants shingrix vaccine  dexa 4/18  Osteopenia  Falls-none  Fractures -none  Supplements -taking D and ca  On estrace  D level is 37.2  (was in the 50s last time)  Exercise -limited by back pain since her lumbar fusion (PT helped)  Wants to go back to  PT She walks around yard and to the mailbox  Advance directive-she has living will and poa  Cognitive function addressed- see scanned forms- and if abnormal then additional documentation follows.  occ forgets a world or misplaces  something Keeps a journal / very organized  Otherwise quite good  Does not get confused or lost   PMH and SH reviewed  Meds, vitals, and allergies reviewed.   ROS: See HPI.  Otherwise negative.    Weight  Wt Readings from Last 3 Encounters:  03/25/19 181 lb 6 oz (82.3 kg)  11/13/18 176 lb 8 oz (80.1 kg)  09/21/18 180 lb 8 oz (81.9 kg)  exercise is limited due to back surgery  Eats well  Limits food at night  No junk food  Lots of water  32.65 kg/m   PHQ: Got down for a while loosing son /dealing with his bills etc   Hearing/vision   Hearing Screening   125Hz 250Hz 500Hz 1000Hz 2000Hz 3000Hz 4000Hz 6000Hz 8000Hz  Right ear:   87 40 40  40    Left ear:   27 40 40  40    Vision Screening Comments: Pt had eye exam on 02/15/19 with Dr. Ellie Lunch    She still has a lot of burping  Once in a while she has a little pain in R side - better after bms  No more diarrhea (has IBS)  She takes fiber supplement pills -works very well for her   Grief - lost son to CHF unexpectedly -grief/getting by    bp is stable today  No cp or palpitations or headaches or edema  No side effects to medicines  BP Readings from Last 3 Encounters:  03/25/19 Marland Kitchen)  Subjective:    Patient ID: Linda Mcgrath, female    DOB: [birth year], 85 y.o.   MRN: [id number]  HPI Here for amw and also annual review of chronic medical problems   I have personally reviewed the Medicare Annual Wellness questionnaire and have noted 1. The patient's medical and social history 2. Their use of alcohol, tobacco or illicit drugs 3. Their current medications and supplements 4. The patient's functional ability including ADL's, fall risks, home safety risks and hearing or visual             impairment. 5. Diet and physical activities 6. Evidence for depression or mood disorders  The patients weight, height, BMI have been recorded in the chart and visual acuity is per eye clinic.  I have made referrals, counseling and provided education to the patient based review of the above and I have provided the pt with a written personalized care plan for preventive services. Reviewed and updated provider list, see scanned forms.  See scanned forms.  Routine anticipatory guidance given to patient.  See health maintenance. Colon cancer screening  Colonoscopy 7/14 -had adenoma but recall was not recommended due to age  Fecal occult blood was pos in 3/20 -having hemorrhoid issues with diarrhea then  wants to do ifob  Breast cancer screening-mammogram 6/20 Self breast exam-no lumps  Flu vaccine 9/19 -plans on it  Tetanus vaccine   Tdap 3/16 Pneumovax-completed  Zoster vaccine  zostavax 8/09 -wants shingrix vaccine  dexa 4/18  Osteopenia  Falls-none  Fractures -none  Supplements -taking D and ca  On estrace  D level is 37.2  (was in the 50s last time)  Exercise -limited by back pain since her lumbar fusion (PT helped)  Wants to go back to  PT She walks around yard and to the mailbox  Advance directive-she has living will and poa  Cognitive function addressed- see scanned forms- and if abnormal then additional documentation follows.  occ forgets a world or misplaces  something Keeps a journal / very organized  Otherwise quite good  Does not get confused or lost   PMH and SH reviewed  Meds, vitals, and allergies reviewed.   ROS: See HPI.  Otherwise negative.    Weight  Wt Readings from Last 3 Encounters:  03/25/19 181 lb 6 oz (82.3 kg)  11/13/18 176 lb 8 oz (80.1 kg)  09/21/18 180 lb 8 oz (81.9 kg)  exercise is limited due to back surgery  Eats well  Limits food at night  No junk food  Lots of water  32.65 kg/m   PHQ: Got down for a while loosing son /dealing with his bills etc   Hearing/vision   Hearing Screening   125Hz 250Hz 500Hz 1000Hz 2000Hz 3000Hz 4000Hz 6000Hz 8000Hz  Right ear:   87 40 40  40    Left ear:   27 40 40  40    Vision Screening Comments: Pt had eye exam on 02/15/19 with Dr. Ellie Lunch    She still has a lot of burping  Once in a while she has a little pain in R side - better after bms  No more diarrhea (has IBS)  She takes fiber supplement pills -works very well for her   Grief - lost son to CHF unexpectedly -grief/getting by    bp is stable today  No cp or palpitations or headaches or edema  No side effects to medicines  BP Readings from Last 3 Encounters:  03/25/19 Marland Kitchen)  No Has patient had a PCN reaction occurring within the last 10 years: No If all of the above answers are "NO", then may proceed with Cephalosporin use.    Current Outpatient Medications on File Prior to Visit  Medication Sig Dispense Refill  . Ascorbic Acid (VITAMIN C) 1000 MG  tablet Take 1,000 mg by mouth daily.      . B Complex Vitamins (VITAMIN B COMPLEX PO) Take 1 tablet by mouth daily.    . Biotin 10 MG TABS Take 1 tablet by mouth every morning.     . Calcium Citrate-Vitamin D (CALCIUM CITRATE + D) 250-200 MG-UNIT TABS Take 1 tablet by mouth daily.    . cholecalciferol (VITAMIN D) 1000 units tablet Take 1,000 Units by mouth daily.    . cyclobenzaprine (FLEXERIL) 10 MG tablet Take 0.5-1 tablets (5-10 mg total) by mouth at bedtime as needed. 90 tablet 1  . famotidine (PEPCID) 20 MG tablet Take 1 tablet (20 mg total) by mouth 2 (two) times daily. 60 tablet 3  . Fexofenadine HCl (MUCINEX ALLERGY PO) Take 1 tablet by mouth daily as needed (Allergies).     . fluticasone (FLONASE) 50 MCG/ACT nasal spray PLACE 2 SPRAYS INTO BOTH NOSTRILS DAILY AS NEEDED FOR ALLERGIES. 16 g 2  . folic acid (FOLVITE) 1 MG tablet Take 1 mg by mouth daily.    . Homeopathic Products (LEG CRAMPS PM SL) Place 2 tablets under the tongue at bedtime.    Marland Kitchen loratadine (CLARITIN) 10 MG tablet Take 10 mg by mouth daily as needed for allergies.     Marland Kitchen MAGNESIUM-OXIDE PO Take 1 tablet by mouth daily.    . Methenamine-Sodium Salicylate (AZO URINARY TRACT DEFENSE PO) Take 1 capsule by mouth daily.    . metroNIDAZOLE (FLAGYL) 500 MG tablet Take 1 tablet (500 mg total) by mouth 3 (three) times daily. 30 tablet 0  . Multiple Vitamin (MULTIVITAMIN) capsule Take 1 capsule by mouth daily.      . TURMERIC PO Take 1 capsule by mouth 2 (two) times daily.     . vitamin B-12 (CYANOCOBALAMIN) 500 MCG tablet Take 500 mcg by mouth daily.     No current facility-administered medications on file prior to visit.     Review of Systems  Constitutional: Negative for activity change, appetite change, fatigue, fever and unexpected weight change.  HENT: Negative for congestion, ear pain, rhinorrhea, sinus pressure and sore throat.   Eyes: Negative for pain, redness and visual disturbance.  Respiratory: Negative for cough,  shortness of breath and wheezing.   Cardiovascular: Negative for chest pain and palpitations.  Gastrointestinal: Negative for abdominal pain, blood in stool, constipation and diarrhea.  Endocrine: Negative for polydipsia and polyuria.  Genitourinary: Negative for dysuria, frequency and urgency.  Musculoskeletal: Positive for arthralgias and back pain. Negative for myalgias.  Skin: Negative for pallor and rash.  Allergic/Immunologic: Negative for environmental allergies.  Neurological: Negative for dizziness, syncope and headaches.  Hematological: Negative for adenopathy. Does not bruise/bleed easily.  Psychiatric/Behavioral: Negative for decreased concentration and dysphoric mood. The patient is nervous/anxious.        Some stressors -loss of son/dealing with medical bills for him       Objective:   Physical Exam Constitutional:      General: She is not in acute distress.    Appearance: Normal appearance. She is well-developed. She is obese. She is not ill-appearing or diaphoretic.  HENT:     Head: Normocephalic and atraumatic.  Subjective:    Patient ID: Linda Mcgrath, female    DOB: [birth year], 85 y.o.   MRN: [id number]  HPI Here for amw and also annual review of chronic medical problems   I have personally reviewed the Medicare Annual Wellness questionnaire and have noted 1. The patient's medical and social history 2. Their use of alcohol, tobacco or illicit drugs 3. Their current medications and supplements 4. The patient's functional ability including ADL's, fall risks, home safety risks and hearing or visual             impairment. 5. Diet and physical activities 6. Evidence for depression or mood disorders  The patients weight, height, BMI have been recorded in the chart and visual acuity is per eye clinic.  I have made referrals, counseling and provided education to the patient based review of the above and I have provided the pt with a written personalized care plan for preventive services. Reviewed and updated provider list, see scanned forms.  See scanned forms.  Routine anticipatory guidance given to patient.  See health maintenance. Colon cancer screening  Colonoscopy 7/14 -had adenoma but recall was not recommended due to age  Fecal occult blood was pos in 3/20 -having hemorrhoid issues with diarrhea then  wants to do ifob  Breast cancer screening-mammogram 6/20 Self breast exam-no lumps  Flu vaccine 9/19 -plans on it  Tetanus vaccine   Tdap 3/16 Pneumovax-completed  Zoster vaccine  zostavax 8/09 -wants shingrix vaccine  dexa 4/18  Osteopenia  Falls-none  Fractures -none  Supplements -taking D and ca  On estrace  D level is 37.2  (was in the 50s last time)  Exercise -limited by back pain since her lumbar fusion (PT helped)  Wants to go back to  PT She walks around yard and to the mailbox  Advance directive-she has living will and poa  Cognitive function addressed- see scanned forms- and if abnormal then additional documentation follows.  occ forgets a world or misplaces  something Keeps a journal / very organized  Otherwise quite good  Does not get confused or lost   PMH and SH reviewed  Meds, vitals, and allergies reviewed.   ROS: See HPI.  Otherwise negative.    Weight  Wt Readings from Last 3 Encounters:  03/25/19 181 lb 6 oz (82.3 kg)  11/13/18 176 lb 8 oz (80.1 kg)  09/21/18 180 lb 8 oz (81.9 kg)  exercise is limited due to back surgery  Eats well  Limits food at night  No junk food  Lots of water  32.65 kg/m   PHQ: Got down for a while loosing son /dealing with his bills etc   Hearing/vision   Hearing Screening   125Hz 250Hz 500Hz 1000Hz 2000Hz 3000Hz 4000Hz 6000Hz 8000Hz  Right ear:   87 40 40  40    Left ear:   27 40 40  40    Vision Screening Comments: Pt had eye exam on 02/15/19 with Dr. Ellie Lunch    She still has a lot of burping  Once in a while she has a little pain in R side - better after bms  No more diarrhea (has IBS)  She takes fiber supplement pills -works very well for her   Grief - lost son to CHF unexpectedly -grief/getting by    bp is stable today  No cp or palpitations or headaches or edema  No side effects to medicines  BP Readings from Last 3 Encounters:  03/25/19 Marland Kitchen)  Subjective:    Patient ID: Linda Mcgrath, female    DOB: [birth year], 85 y.o.   MRN: [id number]  HPI Here for amw and also annual review of chronic medical problems   I have personally reviewed the Medicare Annual Wellness questionnaire and have noted 1. The patient's medical and social history 2. Their use of alcohol, tobacco or illicit drugs 3. Their current medications and supplements 4. The patient's functional ability including ADL's, fall risks, home safety risks and hearing or visual             impairment. 5. Diet and physical activities 6. Evidence for depression or mood disorders  The patients weight, height, BMI have been recorded in the chart and visual acuity is per eye clinic.  I have made referrals, counseling and provided education to the patient based review of the above and I have provided the pt with a written personalized care plan for preventive services. Reviewed and updated provider list, see scanned forms.  See scanned forms.  Routine anticipatory guidance given to patient.  See health maintenance. Colon cancer screening  Colonoscopy 7/14 -had adenoma but recall was not recommended due to age  Fecal occult blood was pos in 3/20 -having hemorrhoid issues with diarrhea then  wants to do ifob  Breast cancer screening-mammogram 6/20 Self breast exam-no lumps  Flu vaccine 9/19 -plans on it  Tetanus vaccine   Tdap 3/16 Pneumovax-completed  Zoster vaccine  zostavax 8/09 -wants shingrix vaccine  dexa 4/18  Osteopenia  Falls-none  Fractures -none  Supplements -taking D and ca  On estrace  D level is 37.2  (was in the 50s last time)  Exercise -limited by back pain since her lumbar fusion (PT helped)  Wants to go back to  PT She walks around yard and to the mailbox  Advance directive-she has living will and poa  Cognitive function addressed- see scanned forms- and if abnormal then additional documentation follows.  occ forgets a world or misplaces  something Keeps a journal / very organized  Otherwise quite good  Does not get confused or lost   PMH and SH reviewed  Meds, vitals, and allergies reviewed.   ROS: See HPI.  Otherwise negative.    Weight  Wt Readings from Last 3 Encounters:  03/25/19 181 lb 6 oz (82.3 kg)  11/13/18 176 lb 8 oz (80.1 kg)  09/21/18 180 lb 8 oz (81.9 kg)  exercise is limited due to back surgery  Eats well  Limits food at night  No junk food  Lots of water  32.65 kg/m   PHQ: Got down for a while loosing son /dealing with his bills etc   Hearing/vision   Hearing Screening   125Hz 250Hz 500Hz 1000Hz 2000Hz 3000Hz 4000Hz 6000Hz 8000Hz  Right ear:   87 40 40  40    Left ear:   27 40 40  40    Vision Screening Comments: Pt had eye exam on 02/15/19 with Dr. Ellie Lunch    She still has a lot of burping  Once in a while she has a little pain in R side - better after bms  No more diarrhea (has IBS)  She takes fiber supplement pills -works very well for her   Grief - lost son to CHF unexpectedly -grief/getting by    bp is stable today  No cp or palpitations or headaches or edema  No side effects to medicines  BP Readings from Last 3 Encounters:  03/25/19 Marland Kitchen)

## 2019-03-25 NOTE — Assessment & Plan Note (Signed)
Reviewed health habits including diet and exercise and skin cancer prevention Reviewed appropriate screening tests for age  Also reviewed health mt list, fam hx and immunization status , as well as social and family history   See HPI Labs reviewed  Will get a flu shot in the fall Given ifob kit  Has adv directive No cognitive concerns  No hearing/vision concerns  Disc dec trans fats in diet for cholesterol  Rev fall prevention

## 2019-03-25 NOTE — Assessment & Plan Note (Signed)
Discussed how this problem influences overall health and the risks it imposes  Reviewed plan for weight loss with lower calorie diet (via better food choices and also portion control or program like weight watchers) and exercise building up to or more than 30 minutes 5 days per week including some aerobic activity    

## 2019-03-25 NOTE — Patient Instructions (Addendum)
Get a flu shot this fall  Get on a wait list at your pharmacy for shingrix   Please do the ifob kit for colon screening   Stop at check out to schedule your bone density test   The office will call you to set up PT   Try to get 4000 iu of vitamin D daily   Cholesterol is up a bit  Avoid red meat/ fried foods/ egg yolks/ fatty breakfast meats/ butter, cheese and high fat dairy/ and shellfish

## 2019-03-25 NOTE — Assessment & Plan Note (Signed)
After spinal fusion  Ref to PT at pt req

## 2019-03-25 NOTE — Assessment & Plan Note (Signed)
Ordered 2 y f/u dexa  On ca and D Will inc D to 4000 iu daily for level in the 30s  Walking limited by spinal stenosis  No falls or fx

## 2019-03-25 NOTE — Assessment & Plan Note (Signed)
Hypothyroidism  Pt has no clinical changes No change in energy level/ hair or skin/ edema and no tremor Lab Results  Component Value Date   TSH 2.81 03/23/2019

## 2019-03-25 NOTE — Assessment & Plan Note (Signed)
Lab Results  Component Value Date   HGBA1C 5.1 03/23/2019   Well controlled disc imp of low glycemic diet and wt loss to prevent DM2

## 2019-03-25 NOTE — Assessment & Plan Note (Signed)
Level in 30s Will inc dose to 4000 iu daily  Disc imp for bone and overall health

## 2019-03-30 ENCOUNTER — Other Ambulatory Visit: Payer: Self-pay | Admitting: Family Medicine

## 2019-03-30 ENCOUNTER — Other Ambulatory Visit (INDEPENDENT_AMBULATORY_CARE_PROVIDER_SITE_OTHER): Payer: Medicare Other

## 2019-03-30 DIAGNOSIS — Z1211 Encounter for screening for malignant neoplasm of colon: Secondary | ICD-10-CM

## 2019-03-30 LAB — FECAL OCCULT BLOOD, IMMUNOCHEMICAL: Fecal Occult Bld: NEGATIVE

## 2019-03-31 ENCOUNTER — Telehealth: Payer: Self-pay

## 2019-03-31 NOTE — Telephone Encounter (Signed)
Left message for patient to call back  

## 2019-04-02 ENCOUNTER — Other Ambulatory Visit: Payer: Self-pay

## 2019-04-02 ENCOUNTER — Ambulatory Visit (INDEPENDENT_AMBULATORY_CARE_PROVIDER_SITE_OTHER)
Admission: RE | Admit: 2019-04-02 | Discharge: 2019-04-02 | Disposition: A | Discharge status: Home / Self Care | Payer: Medicare Other | Source: Ambulatory Visit | Attending: Family Medicine | Admitting: Family Medicine

## 2019-04-02 DIAGNOSIS — E2839 Other primary ovarian failure: Secondary | ICD-10-CM

## 2019-04-06 ENCOUNTER — Encounter: Payer: Self-pay | Admitting: *Deleted

## 2019-04-14 ENCOUNTER — Ambulatory Visit: Payer: Medicare Other | Attending: Family Medicine | Admitting: Physical Therapy

## 2019-04-14 ENCOUNTER — Encounter: Payer: Self-pay | Admitting: Physical Therapy

## 2019-04-14 ENCOUNTER — Other Ambulatory Visit: Payer: Self-pay

## 2019-04-14 DIAGNOSIS — R262 Difficulty in walking, not elsewhere classified: Secondary | ICD-10-CM | POA: Diagnosis not present

## 2019-04-14 DIAGNOSIS — M62830 Muscle spasm of back: Secondary | ICD-10-CM

## 2019-04-14 DIAGNOSIS — M5441 Lumbago with sciatica, right side: Secondary | ICD-10-CM | POA: Diagnosis not present

## 2019-04-14 NOTE — Therapy (Signed)
[facility] [address] [place], [place], [address] Phone: [phone]   Fax:  [phone]  Physical Therapy Evaluation  Patient Details  Name: Linda Mcgrath MRN: [id number] Date of Birth: 09-11-1932 Referring Provider (PT): [name]   Encounter Date: 04/14/2019  PT End of Session - 04/14/19 1426    Visit Number  1    Date for PT Re-Evaluation  06/14/19    PT Start Time  1358    PT Stop Time  1449    PT Time Calculation (min)  51 min    Activity Tolerance  Patient tolerated treatment well    Behavior During Therapy  [facility] for tasks assessed/performed       Past Medical History:  Diagnosis Date  . Allergic rhinitis   . Asthma   . Cataract    Bil/lens implant  . Colon polyp 1999   small polyp  . Diverticulosis   . Dysrhythmia   . Fibromyalgia   . Hyperlipidemia   . Hypothyroidism   . Internal hemorrhoids   . Leg cramps   . Lung nodule    stable LUL 9 mm  . Menopausal syndrome   . Osteoarthritis   . UTI (urinary tract infection)     Past Surgical History:  Procedure Laterality Date  . Abd U/S  11/1998   negative  . Abd U/S  01/2001   gallbladder polyps  . ABDOMINAL HYSTERECTOMY  1975   total-fibroids  . APPENDECTOMY  1975  . BREAST BIOPSY     benign  . BREAST EXCISIONAL BIOPSY Left 1957  . CARDIOVERSION N/A 03/07/2016   Procedure: CARDIOVERSION;  Surgeon: Yates Decamp, MD;  Location: [facility] ENDOSCOPY;  Service: Cardiovascular;  Laterality: N/A;  . CATARACT EXTRACTION     bilateral  . CHOLECYSTECTOMY    . COLONOSCOPY  1998   Diverticulosis; polyp\  . COLONOSCOPY  09/2002   Diverticulosis, hem  . COLONOSCOPY  12/2007   diverticulosis, polyp  . DEXA  10/2001   osteopenia  . ELECTROPHYSIOLOGIC STUDY N/A 04/03/2016   Procedure: A-Flutter Ablation;  Surgeon: Will [name], MD;  [facility] INVASIVE CV LAB;  Service: Cardiovascular;  Laterality: N/A;  . ESOPHAGOGASTRODUODENOSCOPY  2004  . Hida  scan  01/2001   Negative  . KNEE SURGERY     right knee / 11/2004  . LAMINECTOMY WITH POSTERIOR LATERAL ARTHRODESIS LEVEL 2 N/A 07/31/2017   Procedure: DECOMPRESSIVE LAMINECTOMY LUMABR FOUR-FIVE, LUMBAR FIVE-SACRAL ONE;  Surgeon: Tia Alert, MD;  Location: [facility] OR;  Service: Neurosurgery;  Laterality: N/A;  . laser surgery for glaucoma Left Sep 21, 2014  . ROTATOR CUFF REPAIR     x 2 /right shoulder  . SKIN CANCER EXCISION     pre-melanoma / on face  . TEE WITHOUT CARDIOVERSION N/A 03/07/2016   Procedure: TRANSESOPHAGEAL ECHOCARDIOGRAM (TEE);  Surgeon: Yates Decamp, MD;  Location: [facility] ENDOSCOPY;  Service: Cardiovascular;  Laterality: N/A;  . TOE SURGERY     rt foot     There were no vitals filed for this visit.   Subjective Assessment - 04/14/19 1400    Subjective  Patient was seen her last year and did great, she came in back then using a walker and not being able to walk > 50 feet before rest, she left [place] using a cane outside and but indepednent in the home and much less pain.  She reports that over the past 3 months she started having pain in th  elow back again and reports that she has not been able to do much more that 20-30 minutes of activity due to the pain    Limitations  Lifting;Standing;Walking;House hold activities    Patient Stated Goals  have less pain, tolerate more activity         [facility] PT Assessment - 04/14/19 0001      Assessment   Medical Diagnosis  LBP into the right hip    Referring Provider (PT)  [name]    Onset Date/Surgical Date  03/14/19      Precautions   Precautions  None      Balance Screen   Has the patient fallen in the past 6 months  No    Has the patient had a decrease in activity level because of a fear of falling?   No    Is the patient reluctant to leave their home because of a fear of falling?   No      Home Environment   Additional Comments  uses cane for stability, one step into the house, 3 out to the back deck, likes to do yardwork and  gardening      Prior Function   Level of Independence  Independent with community mobility with device    Vocation  Retired    Leisure  garden      Press photographer Comments  stooped, some scoliosis      ROM / Strength   AROM / PROM / Strength  AROM;Strength      AROM   Overall AROM Comments  Lumbar ROM decreased 50% for flexion, decreased 100% for extension and side bending      Strength   Overall Strength Comments  LE 3+/5 for the LE's with c/o "just feel so weak"      Palpation   Palpation comment  patient is very tender and tight in the right lumbar area, she has some very teneer area's with a tender knot in the right lateral hip and the right buttock      Ambulation/Gait   Gait Comments  uses a SPC, slow, the more she walks the more she will stoop at the waist      Standardized Balance Assessment   Standardized Balance Assessment  Timed Up and Go Test      Timed Up and Go Test   Normal TUG (seconds)  24                Objective measurements completed on examination: See above findings.      [facility] Adult PT Treatment/Exercise - 04/14/19 0001      Modalities   Modalities  Electrical Stimulation;Moist Heat      Moist Heat Therapy   Number Minutes Moist Heat  15 Minutes    Moist Heat Location  Lumbar Spine      Electrical Stimulation   Electrical Stimulation Location  right low back into the right hip    Electrical Stimulation Action  IFC    Electrical Stimulation Parameters  sitting    Electrical Stimulation Goals  Pain               PT Short Term Goals - 04/14/19 1432      PT SHORT TERM GOAL #1   Title  independent with HEP    Time  2    Period  Weeks    Status  New        PT Long Term Goals - 04/14/19 1433  PT LONG TERM GOAL #1   Title  decrease pain 50%    Time  8    Period  Weeks    Status  New      PT LONG TERM GOAL #2   Title  Increase lumbar AROM 25% in extension, side-bending, and rotation.      Time  8    Period  Weeks    Status  New      PT LONG TERM GOAL #3   Title  Understand proper posture and body mechanics for housework and yardwork tasks.     Time  8    Period  Weeks    Status  New      PT LONG TERM GOAL #4   Title  tolerate shopping for groceries without an increase in pain    Time  8    Period  Weeks    Status  New      PT LONG TERM GOAL #5   Title  decrease TUG time to 17 seconds    Time  8    Period  Weeks    Status  New             Plan - 04/14/19 1427    Clinical Impression Statement  Patient was seen her last year with great results with helping her back pain after a lumbar surgery.  She reports that about 3 months ago the pain started to come back and she reports decreaesd tolerance to walking and activities.  She is very tight and tender with knots in the right lumbar area, the right buttock and the right lateral hip.  She is wallking with a SPC, fatigues and starts to stoop at teh waist.  TUG was 24 seconds    Personal Factors and Comorbidities  Age;Comorbidity 3+    Comorbidities  TKR, RCR, back surgery    Stability/Clinical Decision Making  Evolving/Moderate complexity    Clinical Decision Making  Low    Rehab Potential  Good    PT Frequency  2x / week    PT Duration  8 weeks    PT Treatment/Interventions  ADLs/Self Care Home Management;Cryotherapy;Electrical Stimulation;Iontophoresis 4mg /ml Dexamethasone;Moist Heat;Ultrasound;Therapeutic activities;Functional mobility training;Gait training;Therapeutic exercise;Balance training;Neuromuscular re-education;Patient/family education;Manual techniques;Dry needling    PT Next Visit Plan  start some gentle exercise, work on ROM and function    Consulted and Agree with Plan of Care  Patient       Patient will benefit from skilled therapeutic intervention in order to improve the following deficits and impairments:  Abnormal gait, Improper body mechanics, Pain, Postural dysfunction, Increased muscle  spasms, Decreased mobility, Decreased activity tolerance, Decreased endurance, Decreased range of motion, Decreased strength, Impaired flexibility, Difficulty walking, Decreased balance  Visit Diagnosis: 1. Acute right-sided low back pain with right-sided sciatica   2. Muscle spasm of back   3. Difficulty in walking, not elsewhere classified        Problem List Patient Active Problem List   Diagnosis Date Noted  . Diarrhea 07/03/2018  . Dyspepsia 07/03/2018  . S/P lumbar spinal fusion 07/31/2017  . Right low back pain 03/04/2017  . H/O atrial flutter 03/06/2016  . Obesity 11/06/2015  . Urge incontinence 01/24/2015  . Estrogen deficiency 10/25/2014  . Lichen planus 12/20/2013  . Encounter for Medicare annual wellness exam 06/17/2013  . Screening mammogram, encounter for 06/15/2012  . Prediabetes 06/03/2011  . HYPERTENSION, BENIGN ESSENTIAL 10/23/2010  . CONSTIPATION 10/23/2010  . HEMATOCHEZIA 10/23/2010  . OSTEOARTHRITIS, HANDS,  BILATERAL 01/24/2010  . Hypothyroidism 08/24/2008  . Vitamin D deficiency 06/06/2008  . Hyperlipidemia 06/06/2008  . MENOPAUSAL SYNDROME 03/29/2008  . Osteopenia 03/29/2008  . ALLERGIC RHINITIS 01/25/2008  . IBS 01/25/2008  . OSTEOARTHRITIS 01/25/2008  . FIBROMYALGIA 01/25/2008    Jearld Lesch., PT 04/14/2019, 2:44 PM  [facility] [address] [place], [place], [address] Phone: [phone]   Fax:  [phone]  Name: Linda Mcgrath MRN: [id number] Date of Birth: 07-09-1933

## 2019-04-15 ENCOUNTER — Encounter: Payer: Self-pay | Admitting: Family Medicine

## 2019-04-15 ENCOUNTER — Ambulatory Visit (INDEPENDENT_AMBULATORY_CARE_PROVIDER_SITE_OTHER): Payer: Medicare Other | Admitting: Family Medicine

## 2019-04-15 VITALS — BP 124/68 | HR 72 | Temp 97.8°F | Ht 62.5 in | Wt 185.1 lb

## 2019-04-15 DIAGNOSIS — Z23 Encounter for immunization: Secondary | ICD-10-CM | POA: Diagnosis not present

## 2019-04-15 DIAGNOSIS — M8589 Other specified disorders of bone density and structure, multiple sites: Secondary | ICD-10-CM | POA: Diagnosis not present

## 2019-04-15 MED ORDER — ALENDRONATE SODIUM 70 MG PO TABS: 70.0000 mg | ORAL_TABLET | ORAL | 11 refills | Status: DC

## 2019-04-15 NOTE — Patient Instructions (Addendum)
I will send aldendronate px to the pharmacy  See the handout   Continue calcium and vitamin D   Hold it if you have any major dental work   Stop if you get side effects   Be very careful not to fall

## 2019-04-15 NOTE — Progress Notes (Signed)
Subjective:    Patient ID: Linda Mcgrath, female    DOB: [birth year], 85 y.o.   MRN: [id number]  HPI Here to discuss dexa from 04/02/19   Results:  Lumbar spine L2-L3 Femoral neck (FN)  T-score -1.4 RFN: -2.0 LFN: -2.0  Change in BMD from previous DXA test (%) n/a n/a   osteopenia  frax 3.7 % (all) and 14% hip risk of fracture   Exercise- not as much  Has 3 PT sessions coming up  Working on leg strength  Also hard being in the house   Wt Readings from Last 3 Encounters:  04/15/19 185 lb 1 oz (83.9 kg)  03/25/19 181 lb 6 oz (82.3 kg)  11/13/18 176 lb 8 oz (80.1 kg)   33.31 kg/m   Lab Results  Component Value Date   CALCIUM 9.7 03/23/2019   PHOS 3.1 06/22/2009   Vit D 37.2  She takes her ca and vit D (2000 iu of D) Also magnesium   Personal hx of fx- tail bone after a fall years ago    Also yogurt/milk   She has GERD but it is in good control She has to watch her diet  Takes pepcid   No fam hx of OP or fractures   Ht down by 1/2 inch in the past several years   Patient Active Problem List   Diagnosis Date Noted  . Diarrhea 07/03/2018  . Dyspepsia 07/03/2018  . S/P lumbar spinal fusion 07/31/2017  . Right low back pain 03/04/2017  . H/O atrial flutter 03/06/2016  . Obesity 11/06/2015  . Urge incontinence 01/24/2015  . Estrogen deficiency 10/25/2014  . Lichen planus 12/20/2013  . Encounter for Medicare annual wellness exam 06/17/2013  . Screening mammogram, encounter for 06/15/2012  . Prediabetes 06/03/2011  . HYPERTENSION, BENIGN ESSENTIAL 10/23/2010  . CONSTIPATION 10/23/2010  . HEMATOCHEZIA 10/23/2010  . OSTEOARTHRITIS, HANDS, BILATERAL 01/24/2010  . Hypothyroidism 08/24/2008  . Vitamin D deficiency 06/06/2008  . Hyperlipidemia 06/06/2008  . MENOPAUSAL SYNDROME 03/29/2008  . Osteopenia 03/29/2008  . ALLERGIC RHINITIS 01/25/2008  . IBS 01/25/2008  . OSTEOARTHRITIS 01/25/2008  . FIBROMYALGIA 01/25/2008   Past Medical History:   Diagnosis Date  . Allergic rhinitis   . Asthma   . Cataract    Bil/lens implant  . Colon polyp 1999   small polyp  . Diverticulosis   . Dysrhythmia   . Fibromyalgia   . Hyperlipidemia   . Hypothyroidism   . Internal hemorrhoids   . Leg cramps   . Lung nodule    stable LUL 9 mm  . Menopausal syndrome   . Osteoarthritis   . UTI (urinary tract infection)    Past Surgical History:  Procedure Laterality Date  . Abd U/S  11/1998   negative  . Abd U/S  01/2001   gallbladder polyps  . ABDOMINAL HYSTERECTOMY  1975   total-fibroids  . APPENDECTOMY  1975  . BREAST BIOPSY     benign  . BREAST EXCISIONAL BIOPSY Left 1957  . CARDIOVERSION N/A 03/07/2016   Procedure: CARDIOVERSION;  Surgeon: Yates Decamp, MD;  Location: [facility] ENDOSCOPY;  Service: Cardiovascular;  Laterality: N/A;  . CATARACT EXTRACTION     bilateral  . CHOLECYSTECTOMY    . COLONOSCOPY  1998   Diverticulosis; polyp\  . COLONOSCOPY  09/2002   Diverticulosis, hem  . COLONOSCOPY  12/2007   diverticulosis, polyp  . DEXA  10/2001   osteopenia  . ELECTROPHYSIOLOGIC STUDY N/A 04/03/2016   Procedure: A-Flutter  Ablation;  Surgeon: Will [name], MD;  Location: [facility] INVASIVE CV LAB;  Service: Cardiovascular;  Laterality: N/A;  . ESOPHAGOGASTRODUODENOSCOPY  2004  . Hida scan  01/2001   Negative  . KNEE SURGERY     right knee / 11/2004  . LAMINECTOMY WITH POSTERIOR LATERAL ARTHRODESIS LEVEL 2 N/A 07/31/2017   Procedure: DECOMPRESSIVE LAMINECTOMY LUMABR FOUR-FIVE, LUMBAR FIVE-SACRAL ONE;  Surgeon: Tia Alert, MD;  Location: [facility] OR;  Service: Neurosurgery;  Laterality: N/A;  . laser surgery for glaucoma Left Sep 21, 2014  . ROTATOR CUFF REPAIR     x 2 /right shoulder  . SKIN CANCER EXCISION     pre-melanoma / on face  . TEE WITHOUT CARDIOVERSION N/A 03/07/2016   Procedure: TRANSESOPHAGEAL ECHOCARDIOGRAM (TEE);  Surgeon: Yates Decamp, MD;  Location: [facility] ENDOSCOPY;  Service: Cardiovascular;  Laterality: N/A;  . TOE SURGERY     rt  foot    Social History   Tobacco Use  . Smoking status: Never Smoker  . Smokeless tobacco: Never Used  Substance Use Topics  . Alcohol use: No    Alcohol/week: 0.0 standard drinks    Comment: occasional-rare  . Drug use: No   Family History  Problem Relation Age of Onset  . Parkinsonism Father   . Colon cancer Brother   . Allergies Mother   . Heart disease Mother   . Kidney failure Son        congenital  (kidney transplant)   . Heart failure Son        died suddenly of CHF    Allergies  Allergen Reactions  . Shellfish Allergy Anaphylaxis and Nausea And Vomiting  . Tetracycline Hives, Nausea And Vomiting, Rash and Other (See Comments)    SYSTEMIC REACTION: heart palpitations,muscle spasms, vomiting  . Amlodipine Besylate Other (See Comments)    REACTION: muscle spasms, extremity swelling, low bp  . Ciprofloxacin Other (See Comments)    REACTION: muscle spasms, insomnia  . Codeine Other (See Comments)    REACTION: hallucinations  . Propoxyphene Hcl Other (See Comments)    Unknown  . Sulfamethoxazole-Trimethoprim Nausea Only and Other (See Comments)    REACTION: dizzy, could not focus eyes and could not concentrate and nausea  . Tape Other (See Comments)    BANDAIDS TAKE OFF HER SKIN; PLEASE USE COBAN OR PAPER   . Xarelto [Rivaroxaban] Other (See Comments)    Aches and pains and nervousness  . Amoxicillin Rash  . 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 Penicillins Swelling, Rash and Other (See Comments)    Has patient had a PCN reaction causing immediate rash, facial/tongue/throat swelling, SOB or lightheadedness with hypotension: Yes Has patient had a PCN reaction causing severe rash involving mucus membranes or skin necrosis: No Has patient had a PCN reaction that required hospitalization No Has  patient had a PCN reaction occurring within the last 10 years: No If all of the above answers are "NO", then may proceed with Cephalosporin use.    Current Outpatient Medications on File Prior to Visit  Medication Sig Dispense Refill  . Ascorbic Acid (VITAMIN C) 1000 MG tablet Take 1,000 mg by mouth daily.      . B Complex Vitamins (VITAMIN B COMPLEX PO) Take 1 tablet by mouth daily.    . Biotin 10 MG  TABS Take 1 tablet by mouth every morning.     . Calcium Citrate-Vitamin D (CALCIUM CITRATE + D) 250-200 MG-UNIT TABS Take 1 tablet by mouth daily.    . cholecalciferol (VITAMIN D) 1000 units tablet Take 1,000 Units by mouth daily.    . cyclobenzaprine (FLEXERIL) 10 MG tablet Take 0.5-1 tablets (5-10 mg total) by mouth at bedtime as needed. 90 tablet 1  . estradiol (ESTRACE) 1 MG tablet Take 1 tablet (1 mg total) by mouth daily. 90 tablet 3  . famotidine (PEPCID) 20 MG tablet Take 1 tablet (20 mg total) by mouth 2 (two) times daily. 60 tablet 3  . Fexofenadine HCl (MUCINEX ALLERGY PO) Take 1 tablet by mouth daily as needed (Allergies).     . fluticasone (FLONASE) 50 MCG/ACT nasal spray PLACE 2 SPRAYS INTO BOTH NOSTRILS DAILY AS NEEDED FOR ALLERGIES. 16 g 2  . folic acid (FOLVITE) 1 MG tablet Take 1 mg by mouth daily.    Marland Kitchen levothyroxine (SYNTHROID) 88 MCG tablet Take 1 tablet (88 mcg total) by mouth daily with breakfast. 90 tablet 3  . loratadine (CLARITIN) 10 MG tablet Take 10 mg by mouth daily as needed for allergies.     Marland Kitchen MAGNESIUM-OXIDE PO Take 1 tablet by mouth daily.    . Methenamine-Sodium Salicylate (AZO URINARY TRACT DEFENSE PO) Take 1 capsule by mouth daily.    . metroNIDAZOLE (FLAGYL) 500 MG tablet Take 1 tablet (500 mg total) by mouth 3 (three) times daily. 30 tablet 0  . Multiple Vitamin (MULTIVITAMIN) capsule Take 1 capsule by mouth daily.      . TURMERIC PO Take 1 capsule by mouth 2 (two) times daily.     . vitamin B-12 (CYANOCOBALAMIN) 500 MCG tablet Take 500 mcg by mouth daily.      No current facility-administered medications on file prior to visit.      Review of Systems  Constitutional: Negative for activity change, appetite change, fatigue, fever and unexpected weight change.  HENT: Negative for congestion, ear pain, rhinorrhea, sinus pressure and sore throat.   Eyes: Negative for pain, redness and visual disturbance.  Respiratory: Negative for cough, shortness of breath and wheezing.   Cardiovascular: Negative for chest pain and palpitations.  Gastrointestinal: Negative for abdominal pain, blood in stool, constipation and diarrhea.  Endocrine: Negative for polydipsia and polyuria.  Genitourinary: Negative for dysuria, frequency and urgency.  Musculoskeletal: Negative for arthralgias, back pain and myalgias.  Skin: Negative for pallor and rash.  Allergic/Immunologic: Negative for environmental allergies.  Neurological: Negative for dizziness, syncope and headaches.       Weakness in legs- working this in PT  Hematological: Negative for adenopathy. Does not bruise/bleed easily.  Psychiatric/Behavioral: Negative for decreased concentration and dysphoric mood. The patient is not nervous/anxious.        Objective:   Physical Exam Constitutional:      General: She is not in acute distress.    Appearance: Normal appearance. She is obese.  HENT:     Head: Atraumatic.     Mouth/Throat:     Mouth: Mucous membranes are moist.     Pharynx: Oropharynx is clear.  Eyes:     General: No scleral icterus.    Extraocular Movements: Extraocular movements intact.     Conjunctiva/sclera: Conjunctivae normal.     Pupils: Pupils are equal, round, and reactive to light.  Neck:     Musculoskeletal: Normal range of motion. No muscular tenderness.  Cardiovascular:     Rate and  Rhythm: Normal rate and regular rhythm.     Pulses: Normal pulses.     Heart sounds: Normal heart sounds.  Pulmonary:     Effort: Pulmonary effort is normal. No respiratory distress.     Breath  sounds: Normal breath sounds. No wheezing or rales.  Musculoskeletal:     Right lower leg: No edema.     Left lower leg: No edema.     Comments: Rounded shoulders  No significant kyphosis   Medium frame  Skin:    General: Skin is warm and dry.     Findings: No rash.  Neurological:     Mental Status: She is alert.  Psychiatric:        Mood and Affect: Mood normal.           Assessment & Plan:   Problem List Items Addressed This Visit      Musculoskeletal and Integument   Osteopenia - Primary    In pt w/o history of fragility fx  FN TS is -2.0 Disc opt for treatment  Continues ca and recently inc D (level 37) Enc exercise- doing PT now  Will try alendronate weekly  Rev poss side eff (esp GI)- and given handout inst to stop it if side eff Plan on 5 y course  Fall prev disc

## 2019-04-15 NOTE — Assessment & Plan Note (Signed)
In pt w/o history of fragility fx  FN TS is -2.0 Disc opt for treatment  Continues ca and recently inc D (level 37) Enc exercise- doing PT now  Will try alendronate weekly  Rev poss side eff (esp GI)- and given handout inst to stop it if side eff Plan on 5 y course  Fall prev disc

## 2019-04-19 ENCOUNTER — Encounter: Payer: Medicare Other | Admitting: Physical Therapy

## 2019-04-19 ENCOUNTER — Ambulatory Visit: Payer: Medicare Other | Admitting: Physical Therapy

## 2019-04-19 ENCOUNTER — Encounter: Payer: Self-pay | Admitting: Physical Therapy

## 2019-04-19 ENCOUNTER — Other Ambulatory Visit: Payer: Self-pay

## 2019-04-19 DIAGNOSIS — M5441 Lumbago with sciatica, right side: Secondary | ICD-10-CM | POA: Diagnosis not present

## 2019-04-19 DIAGNOSIS — M62830 Muscle spasm of back: Secondary | ICD-10-CM

## 2019-04-19 DIAGNOSIS — R262 Difficulty in walking, not elsewhere classified: Secondary | ICD-10-CM

## 2019-04-19 NOTE — Therapy (Signed)
deficits and impairments:  Abnormal gait, Improper body mechanics, Pain, Postural dysfunction, Increased muscle spasms, Decreased mobility, Decreased activity tolerance, Decreased endurance, Decreased range of motion, Decreased strength, Impaired flexibility, Difficulty walking, Decreased balance  Visit Diagnosis: Acute right-sided low back pain with right-sided sciatica  Muscle spasm of back  Difficulty in walking, not elsewhere  classified     Problem List Patient Active Problem List   Diagnosis Date Noted  . Diarrhea 07/03/2018  . Dyspepsia 07/03/2018  . S/P lumbar spinal fusion 07/31/2017  . Right low back pain 03/04/2017  . H/O atrial flutter 03/06/2016  . Obesity 11/06/2015  . Urge incontinence 01/24/2015  . Estrogen deficiency 10/25/2014  . Lichen planus [id number]  . Encounter for Medicare annual wellness exam 06/17/2013  . Screening mammogram, encounter for 06/15/2012  . Prediabetes 06/03/2011  . HYPERTENSION, BENIGN ESSENTIAL 10/23/2010  . CONSTIPATION 10/23/2010  . HEMATOCHEZIA 10/23/2010  . OSTEOARTHRITIS, HANDS, BILATERAL 01/24/2010  . Hypothyroidism 08/24/2008  . Vitamin D deficiency 06/06/2008  . Hyperlipidemia 06/06/2008  . MENOPAUSAL SYNDROME 03/29/2008  . Osteopenia 03/29/2008  . ALLERGIC RHINITIS 01/25/2008  . IBS 01/25/2008  . OSTEOARTHRITIS 01/25/2008  . FIBROMYALGIA 01/25/2008    [name], PT 04/19/2019, 4:28 PM  [address] [place], [place], [address] Phone: [phone]   Fax:  [phone]  Name: Linda Mcgrath MRN: [id number] Date of Birth: 09-01-1932  [address] [place], [place], [address] Phone: [phone]   Fax:  [phone]  Physical Therapy Treatment  Patient Details  Name: Linda Mcgrath MRN: [id number] Date of Birth: 1933-03-27 Referring Provider (PT): [name]   Encounter Date: 04/19/2019  PT End of Session - 04/19/19 1626    Visit Number  2    Date for PT Re-Evaluation  06/14/19    PT Start Time  1350    PT Stop Time  [id number]    PT Time Calculation (min)  47 min    Activity Tolerance  Patient tolerated treatment well    Behavior During Therapy  [facility] for tasks assessed/performed       Past Medical History:  Diagnosis Date  . Allergic rhinitis   . Asthma   . Cataract    Bil/lens implant  . Colon polyp 1999   small polyp  . Diverticulosis   . Dysrhythmia   . Fibromyalgia   . Hyperlipidemia   . Hypothyroidism   . Internal hemorrhoids   . Leg cramps   . Lung nodule    stable LUL 9 mm  . Menopausal syndrome   . Osteoarthritis   . UTI (urinary tract infection)     Past Surgical History:  Procedure Laterality Date  . Abd U/S  11/1998   negative  . Abd U/S  01/2001   gallbladder polyps  . ABDOMINAL HYSTERECTOMY  1975   total-fibroids  . APPENDECTOMY  1975  . BREAST BIOPSY     benign  . BREAST EXCISIONAL BIOPSY Left 1957  . CARDIOVERSION N/A 03/07/2016   Procedure: CARDIOVERSION;  Surgeon: Adrian Prows, MD;  Location: [place];  Service: Cardiovascular;  Laterality: N/A;  . CATARACT EXTRACTION     bilateral  . CHOLECYSTECTOMY    . COLONOSCOPY  1998   Diverticulosis; polyp\  . COLONOSCOPY  09/2002   Diverticulosis, hem  . COLONOSCOPY  12/2007   diverticulosis, polyp  . DEXA  10/2001   osteopenia  . ELECTROPHYSIOLOGIC STUDY N/A 04/03/2016   Procedure: A-Flutter Ablation;  Surgeon: Will Meredith Leeds, MD;  Location: [place] [facility];  Service: Cardiovascular;  Laterality: N/A;  . ESOPHAGOGASTRODUODENOSCOPY  2004  . [place]  scan  01/2001   Negative  . KNEE SURGERY     right knee / 11/2004  . LAMINECTOMY WITH POSTERIOR LATERAL ARTHRODESIS LEVEL 2 N/A 07/31/2017   Procedure: DECOMPRESSIVE LAMINECTOMY LUMABR FOUR-FIVE, LUMBAR FIVE-SACRAL ONE;  Surgeon: Eustace Moore, MD;  Location: [place];  Service: Neurosurgery;  Laterality: N/A;  . laser surgery for glaucoma Left Sep 21, 2014  . ROTATOR CUFF REPAIR     x 2 /right shoulder  . SKIN CANCER EXCISION     pre-melanoma / on face  . TEE WITHOUT CARDIOVERSION N/A 03/07/2016   Procedure: TRANSESOPHAGEAL ECHOCARDIOGRAM (TEE);  Surgeon: Adrian Prows, MD;  Location: [place];  Service: Cardiovascular;  Laterality: N/A;  . TOE SURGERY     rt foot     There were no vitals filed for this visit.  Subjective Assessment - 04/19/19 1623    Subjective  Patient really reports still hurting, she is very tender in the right low back area with some rigidity palpable    Currently in Pain?  Yes    Pain Score  6     Pain Location  Back    Pain Orientation  Right;Lower    Pain Descriptors / Indicators  Aching;Sore  [address] [address] [place], [place], [address] Phone: [phone]   Fax:  [phone]  Physical Therapy Treatment  Patient Details  Name: Linda Mcgrath MRN: [id number] Date of Birth: 1933-03-27 Referring Provider (PT): [name]   Encounter Date: 04/19/2019  PT End of Session - 04/19/19 1626    Visit Number  2    Date for PT Re-Evaluation  06/14/19    PT Start Time  1350    PT Stop Time  [id number]    PT Time Calculation (min)  47 min    Activity Tolerance  Patient tolerated treatment well    Behavior During Therapy  [facility] for tasks assessed/performed       Past Medical History:  Diagnosis Date  . Allergic rhinitis   . Asthma   . Cataract    Bil/lens implant  . Colon polyp 1999   small polyp  . Diverticulosis   . Dysrhythmia   . Fibromyalgia   . Hyperlipidemia   . Hypothyroidism   . Internal hemorrhoids   . Leg cramps   . Lung nodule    stable LUL 9 mm  . Menopausal syndrome   . Osteoarthritis   . UTI (urinary tract infection)     Past Surgical History:  Procedure Laterality Date  . Abd U/S  11/1998   negative  . Abd U/S  01/2001   gallbladder polyps  . ABDOMINAL HYSTERECTOMY  1975   total-fibroids  . APPENDECTOMY  1975  . BREAST BIOPSY     benign  . BREAST EXCISIONAL BIOPSY Left 1957  . CARDIOVERSION N/A 03/07/2016   Procedure: CARDIOVERSION;  Surgeon: Adrian Prows, MD;  Location: [place];  Service: Cardiovascular;  Laterality: N/A;  . CATARACT EXTRACTION     bilateral  . CHOLECYSTECTOMY    . COLONOSCOPY  1998   Diverticulosis; polyp\  . COLONOSCOPY  09/2002   Diverticulosis, hem  . COLONOSCOPY  12/2007   diverticulosis, polyp  . DEXA  10/2001   osteopenia  . ELECTROPHYSIOLOGIC STUDY N/A 04/03/2016   Procedure: A-Flutter Ablation;  Surgeon: Will Meredith Leeds, MD;  Location: [place] [facility];  Service: Cardiovascular;  Laterality: N/A;  . ESOPHAGOGASTRODUODENOSCOPY  2004  . [place]  scan  01/2001   Negative  . KNEE SURGERY     right knee / 11/2004  . LAMINECTOMY WITH POSTERIOR LATERAL ARTHRODESIS LEVEL 2 N/A 07/31/2017   Procedure: DECOMPRESSIVE LAMINECTOMY LUMABR FOUR-FIVE, LUMBAR FIVE-SACRAL ONE;  Surgeon: Eustace Moore, MD;  Location: [place];  Service: Neurosurgery;  Laterality: N/A;  . laser surgery for glaucoma Left Sep 21, 2014  . ROTATOR CUFF REPAIR     x 2 /right shoulder  . SKIN CANCER EXCISION     pre-melanoma / on face  . TEE WITHOUT CARDIOVERSION N/A 03/07/2016   Procedure: TRANSESOPHAGEAL ECHOCARDIOGRAM (TEE);  Surgeon: Adrian Prows, MD;  Location: [place];  Service: Cardiovascular;  Laterality: N/A;  . TOE SURGERY     rt foot     There were no vitals filed for this visit.  Subjective Assessment - 04/19/19 1623    Subjective  Patient really reports still hurting, she is very tender in the right low back area with some rigidity palpable    Currently in Pain?  Yes    Pain Score  6     Pain Location  Back    Pain Orientation  Right;Lower    Pain Descriptors / Indicators  Aching;Sore

## 2019-04-21 ENCOUNTER — Encounter: Payer: Medicare Other | Admitting: Physical Therapy

## 2019-04-21 ENCOUNTER — Ambulatory Visit: Payer: Medicare Other | Admitting: Physical Therapy

## 2019-04-21 ENCOUNTER — Other Ambulatory Visit: Payer: Self-pay

## 2019-04-21 ENCOUNTER — Encounter: Payer: Self-pay | Admitting: Physical Therapy

## 2019-04-21 DIAGNOSIS — R262 Difficulty in walking, not elsewhere classified: Secondary | ICD-10-CM | POA: Diagnosis not present

## 2019-04-21 DIAGNOSIS — M62830 Muscle spasm of back: Secondary | ICD-10-CM | POA: Diagnosis not present

## 2019-04-21 DIAGNOSIS — M5441 Lumbago with sciatica, right side: Secondary | ICD-10-CM

## 2019-04-21 NOTE — Therapy (Signed)
[address] [place], [place], [address] Phone: [phone]   Fax:  [phone]  Physical Therapy Treatment  Patient Details  Name: Linda Mcgrath MRN: [id number] Date of Birth: 04-11-1933 Referring Provider (PT): [name]   Encounter Date: 04/21/2019  PT End of Session - 04/21/19 1416    Visit Number  3    Date for PT Re-Evaluation  06/14/19    PT Start Time  [id number]    PT Stop Time  1433    PT Time Calculation (min)  48 min    Activity Tolerance  Patient tolerated treatment well    Behavior During Therapy  [facility] for tasks assessed/performed       Past Medical History:  Diagnosis Date  . Allergic rhinitis   . Asthma   . Cataract    Bil/lens implant  . Colon polyp 1999   small polyp  . Diverticulosis   . Dysrhythmia   . Fibromyalgia   . Hyperlipidemia   . Hypothyroidism   . Internal hemorrhoids   . Leg cramps   . Lung nodule    stable LUL 9 mm  . Menopausal syndrome   . Osteoarthritis   . UTI (urinary tract infection)     Past Surgical History:  Procedure Laterality Date  . Abd U/S  11/1998   negative  . Abd U/S  01/2001   gallbladder polyps  . ABDOMINAL HYSTERECTOMY  1975   total-fibroids  . APPENDECTOMY  1975  . BREAST BIOPSY     benign  . BREAST EXCISIONAL BIOPSY Left 1957  . CARDIOVERSION N/A 03/07/2016   Procedure: CARDIOVERSION;  Surgeon: Adrian Prows, MD;  Location: [place];  Service: Cardiovascular;  Laterality: N/A;  . CATARACT EXTRACTION     bilateral  . CHOLECYSTECTOMY    . COLONOSCOPY  1998   Diverticulosis; polyp\  . COLONOSCOPY  09/2002   Diverticulosis, hem  . COLONOSCOPY  12/2007   diverticulosis, polyp  . DEXA  10/2001   osteopenia  . ELECTROPHYSIOLOGIC STUDY N/A 04/03/2016   Procedure: A-Flutter Ablation;  Surgeon: Will Meredith Leeds, MD;  Location: [place] [facility];  Service: Cardiovascular;  Laterality: N/A;  . ESOPHAGOGASTRODUODENOSCOPY  2004  . [place]  scan  01/2001   Negative  . KNEE SURGERY     right knee / 11/2004  . LAMINECTOMY WITH POSTERIOR LATERAL ARTHRODESIS LEVEL 2 N/A 07/31/2017   Procedure: DECOMPRESSIVE LAMINECTOMY LUMABR FOUR-FIVE, LUMBAR FIVE-SACRAL ONE;  Surgeon: Eustace Moore, MD;  Location: [place];  Service: Neurosurgery;  Laterality: N/A;  . laser surgery for glaucoma Left Sep 21, 2014  . ROTATOR CUFF REPAIR     x 2 /right shoulder  . SKIN CANCER EXCISION     pre-melanoma / on face  . TEE WITHOUT CARDIOVERSION N/A 03/07/2016   Procedure: TRANSESOPHAGEAL ECHOCARDIOGRAM (TEE);  Surgeon: Adrian Prows, MD;  Location: [place];  Service: Cardiovascular;  Laterality: N/A;  . TOE SURGERY     rt foot     There were no vitals filed for this visit.  Subjective Assessment - 04/21/19 1412    Subjective  Patient reports that she is still hurting.    Currently in Pain?  Yes    Pain Score  6     Pain Location  Back    Pain Orientation  Right;Lower                       [facility]  [address] [place] [place], [place], [address] Phone: [phone]   Fax:  [phone]  Physical Therapy Treatment  Patient Details  Name: Linda Mcgrath MRN: [id number] Date of Birth: 04-11-1933 Referring Provider (PT): [name]   Encounter Date: 04/21/2019  PT End of Session - 04/21/19 1416    Visit Number  3    Date for PT Re-Evaluation  06/14/19    PT Start Time  [id number]    PT Stop Time  1433    PT Time Calculation (min)  48 min    Activity Tolerance  Patient tolerated treatment well    Behavior During Therapy  [facility] for tasks assessed/performed       Past Medical History:  Diagnosis Date  . Allergic rhinitis   . Asthma   . Cataract    Bil/lens implant  . Colon polyp 1999   small polyp  . Diverticulosis   . Dysrhythmia   . Fibromyalgia   . Hyperlipidemia   . Hypothyroidism   . Internal hemorrhoids   . Leg cramps   . Lung nodule    stable LUL 9 mm  . Menopausal syndrome   . Osteoarthritis   . UTI (urinary tract infection)     Past Surgical History:  Procedure Laterality Date  . Abd U/S  11/1998   negative  . Abd U/S  01/2001   gallbladder polyps  . ABDOMINAL HYSTERECTOMY  1975   total-fibroids  . APPENDECTOMY  1975  . BREAST BIOPSY     benign  . BREAST EXCISIONAL BIOPSY Left 1957  . CARDIOVERSION N/A 03/07/2016   Procedure: CARDIOVERSION;  Surgeon: Adrian Prows, MD;  Location: [place];  Service: Cardiovascular;  Laterality: N/A;  . CATARACT EXTRACTION     bilateral  . CHOLECYSTECTOMY    . COLONOSCOPY  1998   Diverticulosis; polyp\  . COLONOSCOPY  09/2002   Diverticulosis, hem  . COLONOSCOPY  12/2007   diverticulosis, polyp  . DEXA  10/2001   osteopenia  . ELECTROPHYSIOLOGIC STUDY N/A 04/03/2016   Procedure: A-Flutter Ablation;  Surgeon: Will Meredith Leeds, MD;  Location: [place] [facility];  Service: Cardiovascular;  Laterality: N/A;  . ESOPHAGOGASTRODUODENOSCOPY  2004  . [place]  scan  01/2001   Negative  . KNEE SURGERY     right knee / 11/2004  . LAMINECTOMY WITH POSTERIOR LATERAL ARTHRODESIS LEVEL 2 N/A 07/31/2017   Procedure: DECOMPRESSIVE LAMINECTOMY LUMABR FOUR-FIVE, LUMBAR FIVE-SACRAL ONE;  Surgeon: Eustace Moore, MD;  Location: [place];  Service: Neurosurgery;  Laterality: N/A;  . laser surgery for glaucoma Left Sep 21, 2014  . ROTATOR CUFF REPAIR     x 2 /right shoulder  . SKIN CANCER EXCISION     pre-melanoma / on face  . TEE WITHOUT CARDIOVERSION N/A 03/07/2016   Procedure: TRANSESOPHAGEAL ECHOCARDIOGRAM (TEE);  Surgeon: Adrian Prows, MD;  Location: [place];  Service: Cardiovascular;  Laterality: N/A;  . TOE SURGERY     rt foot     There were no vitals filed for this visit.  Subjective Assessment - 04/21/19 1412    Subjective  Patient reports that she is still hurting.    Currently in Pain?  Yes    Pain Score  6     Pain Location  Back    Pain Orientation  Right;Lower                       [facility]  walking, Decreased balance  Visit Diagnosis: Acute right-sided low back pain with right-sided sciatica  Muscle spasm of back  Difficulty in walking, not elsewhere classified     Problem List Patient Active Problem List   Diagnosis Date Noted  . Diarrhea  07/03/2018  . Dyspepsia 07/03/2018  . S/P lumbar spinal fusion 07/31/2017  . Right low back pain 03/04/2017  . H/O atrial flutter 03/06/2016  . Obesity 11/06/2015  . Urge incontinence 01/24/2015  . Estrogen deficiency 10/25/2014  . Lichen planus [id number]  . Encounter for Medicare annual wellness exam 06/17/2013  . Screening mammogram, encounter for 06/15/2012  . Prediabetes 06/03/2011  . HYPERTENSION, BENIGN ESSENTIAL 10/23/2010  . CONSTIPATION 10/23/2010  . HEMATOCHEZIA 10/23/2010  . OSTEOARTHRITIS, HANDS, BILATERAL 01/24/2010  . Hypothyroidism 08/24/2008  . Vitamin D deficiency 06/06/2008  . Hyperlipidemia 06/06/2008  . MENOPAUSAL SYNDROME 03/29/2008  . Osteopenia 03/29/2008  . ALLERGIC RHINITIS 01/25/2008  . IBS 01/25/2008  . OSTEOARTHRITIS 01/25/2008  . FIBROMYALGIA 01/25/2008    [name], PT 04/21/2019, 2:22 PM  [address] [place], [place], [address] Phone: [phone]   Fax:  [phone]  Name: Linda Mcgrath MRN: [id number] Date of Birth: 13-Jul-1933

## 2019-04-26 ENCOUNTER — Encounter: Payer: Self-pay | Admitting: Physical Therapy

## 2019-04-26 ENCOUNTER — Ambulatory Visit: Payer: Medicare Other | Admitting: Physical Therapy

## 2019-04-26 ENCOUNTER — Other Ambulatory Visit: Payer: Self-pay

## 2019-04-26 DIAGNOSIS — M62830 Muscle spasm of back: Secondary | ICD-10-CM

## 2019-04-26 DIAGNOSIS — R262 Difficulty in walking, not elsewhere classified: Secondary | ICD-10-CM

## 2019-04-26 DIAGNOSIS — M5441 Lumbago with sciatica, right side: Secondary | ICD-10-CM

## 2019-04-26 NOTE — Therapy (Signed)
therapeutic intervention in order to improve the following deficits and impairments:  Abnormal gait, Improper body mechanics, Pain, Postural dysfunction, Increased muscle spasms, Decreased mobility, Decreased activity tolerance, Decreased endurance, Decreased range of motion, Decreased strength, Impaired flexibility, Difficulty walking, Decreased balance  Visit  Diagnosis: Acute right-sided low back pain with right-sided sciatica  Muscle spasm of back  Difficulty in walking, not elsewhere classified     Problem List Patient Active Problem List   Diagnosis Date Noted  . Diarrhea 07/03/2018  . Dyspepsia 07/03/2018  . S/P lumbar spinal fusion 07/31/2017  . Right low back pain 03/04/2017  . H/O atrial flutter 03/06/2016  . Obesity 11/06/2015  . Urge incontinence 01/24/2015  . Estrogen deficiency 10/25/2014  . Lichen planus [id number]  . Encounter for Medicare annual wellness exam 06/17/2013  . Screening mammogram, encounter for 06/15/2012  . Prediabetes 06/03/2011  . HYPERTENSION, BENIGN ESSENTIAL 10/23/2010  . CONSTIPATION 10/23/2010  . HEMATOCHEZIA 10/23/2010  . OSTEOARTHRITIS, HANDS, BILATERAL 01/24/2010  . Hypothyroidism 08/24/2008  . Vitamin D deficiency 06/06/2008  . Hyperlipidemia 06/06/2008  . MENOPAUSAL SYNDROME 03/29/2008  . Osteopenia 03/29/2008  . ALLERGIC RHINITIS 01/25/2008  . IBS 01/25/2008  . OSTEOARTHRITIS 01/25/2008  . FIBROMYALGIA 01/25/2008    [name], PT 04/26/2019, 2:28 PM  [address] [address] [place], [place], [address] Phone: [phone]   Fax:  [phone]  Name: Linda Mcgrath MRN: [id number] Date of Birth: 1933-07-17  therapeutic intervention in order to improve the following deficits and impairments:  Abnormal gait, Improper body mechanics, Pain, Postural dysfunction, Increased muscle spasms, Decreased mobility, Decreased activity tolerance, Decreased endurance, Decreased range of motion, Decreased strength, Impaired flexibility, Difficulty walking, Decreased balance  Visit  Diagnosis: Acute right-sided low back pain with right-sided sciatica  Muscle spasm of back  Difficulty in walking, not elsewhere classified     Problem List Patient Active Problem List   Diagnosis Date Noted  . Diarrhea 07/03/2018  . Dyspepsia 07/03/2018  . S/P lumbar spinal fusion 07/31/2017  . Right low back pain 03/04/2017  . H/O atrial flutter 03/06/2016  . Obesity 11/06/2015  . Urge incontinence 01/24/2015  . Estrogen deficiency 10/25/2014  . Lichen planus [id number]  . Encounter for Medicare annual wellness exam 06/17/2013  . Screening mammogram, encounter for 06/15/2012  . Prediabetes 06/03/2011  . HYPERTENSION, BENIGN ESSENTIAL 10/23/2010  . CONSTIPATION 10/23/2010  . HEMATOCHEZIA 10/23/2010  . OSTEOARTHRITIS, HANDS, BILATERAL 01/24/2010  . Hypothyroidism 08/24/2008  . Vitamin D deficiency 06/06/2008  . Hyperlipidemia 06/06/2008  . MENOPAUSAL SYNDROME 03/29/2008  . Osteopenia 03/29/2008  . ALLERGIC RHINITIS 01/25/2008  . IBS 01/25/2008  . OSTEOARTHRITIS 01/25/2008  . FIBROMYALGIA 01/25/2008    [name], PT 04/26/2019, 2:28 PM  [place] [address] [place], [place], [address] Phone: [phone]   Fax:  [phone]  Name: Linda Mcgrath MRN: [id number] Date of Birth: 1933-07-17  [place] [place], [place], [address] Phone: [phone]   Fax:  [phone]  Physical Therapy Treatment  Patient Details  Name: Linda Mcgrath MRN: [id number] Date of Birth: [birth year] Referring Provider (PT): [name]   Encounter Date: 04/26/2019  PT End of Session - 04/26/19 1424    Visit Number  4    Date for PT Re-Evaluation  06/14/19    PT Start Time  1344    PT Stop Time  1439    PT Time Calculation (min)  55 min    Activity Tolerance  Patient tolerated treatment well    Behavior During Therapy  [facility] for tasks assessed/performed       Past Medical History:  Diagnosis Date  . Allergic rhinitis   . Asthma   . Cataract    Bil/lens implant  . Colon polyp 1999   small polyp  . Diverticulosis   . Dysrhythmia   . Fibromyalgia   . Hyperlipidemia   . Hypothyroidism   . Internal hemorrhoids   . Leg cramps   . Lung nodule    stable LUL 9 mm  . Menopausal syndrome   . Osteoarthritis   . UTI (urinary tract infection)     Past Surgical History:  Procedure Laterality Date  . Abd U/S  11/1998   negative  . Abd U/S  01/2001   gallbladder polyps  . ABDOMINAL HYSTERECTOMY  1975   total-fibroids  . APPENDECTOMY  1975  . BREAST BIOPSY     benign  . BREAST EXCISIONAL BIOPSY Left 1957  . CARDIOVERSION N/A 03/07/2016   Procedure: CARDIOVERSION;  Surgeon: Adrian Prows, MD;  Location: [place];  Service: Cardiovascular;  Laterality: N/A;  . CATARACT EXTRACTION     bilateral  . CHOLECYSTECTOMY    . COLONOSCOPY  1998   Diverticulosis; polyp\  . COLONOSCOPY  09/2002   Diverticulosis, hem  . COLONOSCOPY  12/2007   diverticulosis, polyp  . DEXA  10/2001   osteopenia  . ELECTROPHYSIOLOGIC STUDY N/A 04/03/2016   Procedure: A-Flutter Ablation;  Surgeon: Will Meredith Leeds, MD;  Location: [place] [facility];  Service: Cardiovascular;  Laterality: N/A;  . ESOPHAGOGASTRODUODENOSCOPY  2004  . [place]  scan  01/2001   Negative  . KNEE SURGERY     right knee / 11/2004  . LAMINECTOMY WITH POSTERIOR LATERAL ARTHRODESIS LEVEL 2 N/A 07/31/2017   Procedure: DECOMPRESSIVE LAMINECTOMY LUMABR FOUR-FIVE, LUMBAR FIVE-SACRAL ONE;  Surgeon: Eustace Moore, MD;  Location: [place];  Service: Neurosurgery;  Laterality: N/A;  . laser surgery for glaucoma Left Sep 21, 2014  . ROTATOR CUFF REPAIR     x 2 /right shoulder  . SKIN CANCER EXCISION     pre-melanoma / on face  . TEE WITHOUT CARDIOVERSION N/A 03/07/2016   Procedure: TRANSESOPHAGEAL ECHOCARDIOGRAM (TEE);  Surgeon: Adrian Prows, MD;  Location: [place];  Service: Cardiovascular;  Laterality: N/A;  . TOE SURGERY     rt foot     There were no vitals filed for this visit.  Subjective Assessment - 04/26/19 1350    Subjective  The weather has me aching all over    Currently in Pain?  Yes    Pain Score  7     Pain Location  Back    Pain Orientation  Right;Lower    Aggravating Factors   weather

## 2019-04-28 ENCOUNTER — Other Ambulatory Visit: Payer: Self-pay

## 2019-04-28 ENCOUNTER — Encounter: Payer: Self-pay | Admitting: Physical Therapy

## 2019-04-28 ENCOUNTER — Ambulatory Visit: Payer: Medicare Other | Attending: Family Medicine | Admitting: Physical Therapy

## 2019-04-28 DIAGNOSIS — R262 Difficulty in walking, not elsewhere classified: Secondary | ICD-10-CM | POA: Diagnosis not present

## 2019-04-28 DIAGNOSIS — M5441 Lumbago with sciatica, right side: Secondary | ICD-10-CM

## 2019-04-28 DIAGNOSIS — M62830 Muscle spasm of back: Secondary | ICD-10-CM | POA: Diagnosis not present

## 2019-04-28 NOTE — Therapy (Signed)
Prediabetes 06/03/2011  . HYPERTENSION, BENIGN ESSENTIAL 10/23/2010  . CONSTIPATION 10/23/2010  . HEMATOCHEZIA 10/23/2010  . OSTEOARTHRITIS, HANDS, BILATERAL 01/24/2010  . Hypothyroidism 08/24/2008  . Vitamin D deficiency 06/06/2008  . Hyperlipidemia 06/06/2008  . MENOPAUSAL SYNDROME 03/29/2008  . Osteopenia 03/29/2008  . ALLERGIC RHINITIS 01/25/2008  . IBS 01/25/2008  . OSTEOARTHRITIS 01/25/2008  . FIBROMYALGIA 01/25/2008    [name], PT 04/28/2019, 2:35 PM  [address] [place], [place], [address] Phone: [phone]   Fax:  [phone]  Name: Linda Mcgrath MRN: [id number] Date of Birth: [birth year]  [address] [facility] [place], [place], [address] Phone: [phone]   Fax:  [phone]  Physical Therapy Treatment  Patient Details  Name: Linda Mcgrath MRN: [id number] Date of Birth: 1933/05/16 Referring Provider (PT): [name]   Encounter Date: 04/28/2019  PT End of Session - 04/28/19 1429    Visit Number  5    Date for PT Re-Evaluation  06/14/19    PT Start Time  1341    PT Stop Time  1440    PT Time Calculation (min)  59 min    Activity Tolerance  Patient tolerated treatment well    Behavior During Therapy  [facility] for tasks assessed/performed       Past Medical History:  Diagnosis Date  . Allergic rhinitis   . Asthma   . Cataract    Bil/lens implant  . Colon polyp 1999   small polyp  . Diverticulosis   . Dysrhythmia   . Fibromyalgia   . Hyperlipidemia   . Hypothyroidism   . Internal hemorrhoids   . Leg cramps   . Lung nodule    stable LUL 9 mm  . Menopausal syndrome   . Osteoarthritis   . UTI (urinary tract infection)     Past Surgical History:  Procedure Laterality Date  . Abd U/S  11/1998   negative  . Abd U/S  01/2001   gallbladder polyps  . ABDOMINAL HYSTERECTOMY  1975   total-fibroids  . APPENDECTOMY  1975  . BREAST BIOPSY     benign  . BREAST EXCISIONAL BIOPSY Left 1957  . CARDIOVERSION N/A 03/07/2016   Procedure: CARDIOVERSION;  Surgeon: Adrian Prows, MD;  Location: [place];  Service: Cardiovascular;  Laterality: N/A;  . CATARACT EXTRACTION     bilateral  . CHOLECYSTECTOMY    . COLONOSCOPY  1998   Diverticulosis; polyp\  . COLONOSCOPY  09/2002   Diverticulosis, hem  . COLONOSCOPY  12/2007   diverticulosis, polyp  . DEXA  10/2001   osteopenia  . ELECTROPHYSIOLOGIC STUDY N/A 04/03/2016   Procedure: A-Flutter Ablation;  Surgeon: Will Meredith Leeds, MD;  Location: [place] [facility];  Service: Cardiovascular;  Laterality: N/A;  . ESOPHAGOGASTRODUODENOSCOPY  2004  . Hida  scan  01/2001   Negative  . KNEE SURGERY     right knee / 11/2004  . LAMINECTOMY WITH POSTERIOR LATERAL ARTHRODESIS LEVEL 2 N/A 07/31/2017   Procedure: DECOMPRESSIVE LAMINECTOMY LUMABR FOUR-FIVE, LUMBAR FIVE-SACRAL ONE;  Surgeon: Eustace Moore, MD;  Location: [place];  Service: Neurosurgery;  Laterality: N/A;  . laser surgery for glaucoma Left Sep 21, 2014  . ROTATOR CUFF REPAIR     x 2 /right shoulder  . SKIN CANCER EXCISION     pre-melanoma / on face  . TEE WITHOUT CARDIOVERSION N/A 03/07/2016   Procedure: TRANSESOPHAGEAL ECHOCARDIOGRAM (TEE);  Surgeon: Adrian Prows, MD;  Location: [place];  Service: Cardiovascular;  Laterality: N/A;  . TOE SURGERY     rt foot     There were no vitals filed for this visit.  Subjective Assessment - 04/28/19 1406    Subjective  Patient reports that she is feeling better today.  Less pain and stiffness, still sore    Currently in Pain?  Yes    Pain Score  5     Pain Location  Back    Pain Orientation  Right;Lower  Prediabetes 06/03/2011  . HYPERTENSION, BENIGN ESSENTIAL 10/23/2010  . CONSTIPATION 10/23/2010  . HEMATOCHEZIA 10/23/2010  . OSTEOARTHRITIS, HANDS, BILATERAL 01/24/2010  . Hypothyroidism 08/24/2008  . Vitamin D deficiency 06/06/2008  . Hyperlipidemia 06/06/2008  . MENOPAUSAL SYNDROME 03/29/2008  . Osteopenia 03/29/2008  . ALLERGIC RHINITIS 01/25/2008  . IBS 01/25/2008  . OSTEOARTHRITIS 01/25/2008  . FIBROMYALGIA 01/25/2008    [name], PT 04/28/2019, 2:35 PM  [address] [place], [place], [address] Phone: [phone]   Fax:  [phone]  Name: Linda Mcgrath MRN: [id number] Date of Birth: [birth year]

## 2019-05-04 ENCOUNTER — Other Ambulatory Visit: Payer: Self-pay

## 2019-05-04 ENCOUNTER — Ambulatory Visit: Payer: Medicare Other | Admitting: Physical Therapy

## 2019-05-04 ENCOUNTER — Encounter: Payer: Self-pay | Admitting: Physical Therapy

## 2019-05-04 DIAGNOSIS — M5441 Lumbago with sciatica, right side: Secondary | ICD-10-CM | POA: Diagnosis not present

## 2019-05-04 DIAGNOSIS — R262 Difficulty in walking, not elsewhere classified: Secondary | ICD-10-CM | POA: Diagnosis not present

## 2019-05-04 DIAGNOSIS — M62830 Muscle spasm of back: Secondary | ICD-10-CM | POA: Diagnosis not present

## 2019-05-04 NOTE — Therapy (Signed)
Diagnosis Date Noted  . Diarrhea 07/03/2018  . Dyspepsia 07/03/2018  . S/P lumbar spinal fusion 07/31/2017  . Right low back pain 03/04/2017  . H/O atrial flutter 03/06/2016  . Obesity 11/06/2015  . Urge incontinence 01/24/2015  . Estrogen deficiency 10/25/2014  . Lichen planus 03/55/9741  . Encounter for Medicare annual wellness exam 06/17/2013  . Screening mammogram, encounter for 06/15/2012  . Prediabetes 06/03/2011  . HYPERTENSION, BENIGN ESSENTIAL 10/23/2010  . CONSTIPATION 10/23/2010  . HEMATOCHEZIA 10/23/2010  . OSTEOARTHRITIS, HANDS, BILATERAL 01/24/2010  . Hypothyroidism 08/24/2008  . Vitamin D deficiency 06/06/2008  . Hyperlipidemia 06/06/2008  . MENOPAUSAL SYNDROME 03/29/2008  . Osteopenia 03/29/2008  . ALLERGIC RHINITIS 01/25/2008  . IBS 01/25/2008  . OSTEOARTHRITIS 01/25/2008  . FIBROMYALGIA 01/25/2008    [name], PT 05/04/2019, 2:28 PM  [place] [place], [place], [address] Phone: [phone]   Fax:  [phone]  Name: Linda Mcgrath MRN: [id number] Date of Birth: 1933-08-10  [place], [place], [address] Phone: [phone]   Fax:  [phone]  Physical Therapy Treatment  Patient Details  Name: Linda Mcgrath MRN: [id number] Date of Birth: Jul 24, 1933 Referring Provider (PT): [name]   Encounter Date: 05/04/2019  PT End of Session - 05/04/19 1408    Visit Number  6    Date for PT Re-Evaluation  06/14/19    PT Start Time  1332    PT Stop Time  1425    PT Time Calculation (min)  53 min    Activity Tolerance  Patient tolerated treatment well    Behavior During Therapy  [facility] for tasks assessed/performed       Past Medical History:  Diagnosis Date  . Allergic rhinitis   . Asthma   . Cataract    Bil/lens implant  . Colon polyp 1999   small polyp  . Diverticulosis   . Dysrhythmia   . Fibromyalgia   . Hyperlipidemia   . Hypothyroidism   . Internal hemorrhoids   . Leg cramps   . Lung nodule    stable LUL 9 mm  . Menopausal syndrome   . Osteoarthritis   . UTI (urinary tract infection)     Past Surgical History:  Procedure Laterality Date  . Abd U/S  11/1998   negative  . Abd U/S  01/2001   gallbladder polyps  . ABDOMINAL HYSTERECTOMY  1975   total-fibroids  . APPENDECTOMY  1975  . BREAST BIOPSY     benign  . BREAST EXCISIONAL BIOPSY Left 1957  . CARDIOVERSION N/A 03/07/2016   Procedure: CARDIOVERSION;  Surgeon: Adrian Prows, MD;  Location: [place];  Service: Cardiovascular;  Laterality: N/A;  . CATARACT EXTRACTION     bilateral  . CHOLECYSTECTOMY    . COLONOSCOPY  1998   Diverticulosis; polyp\  . COLONOSCOPY  09/2002   Diverticulosis, hem  . COLONOSCOPY  12/2007   diverticulosis, polyp  . DEXA  10/2001   osteopenia  . ELECTROPHYSIOLOGIC STUDY N/A 04/03/2016   Procedure: A-Flutter Ablation;  Surgeon: Will Meredith Leeds, MD;  Location: [place] [facility];  Service: Cardiovascular;  Laterality: N/A;  . ESOPHAGOGASTRODUODENOSCOPY  2004  . [place]  scan  01/2001   Negative  . KNEE SURGERY     right knee / 11/2004  . LAMINECTOMY WITH POSTERIOR LATERAL ARTHRODESIS LEVEL 2 N/A 07/31/2017   Procedure: DECOMPRESSIVE LAMINECTOMY LUMABR FOUR-FIVE, LUMBAR FIVE-SACRAL ONE;  Surgeon: Eustace Moore, MD;  Location: [place];  Service: Neurosurgery;  Laterality: N/A;  . laser surgery for glaucoma Left Sep 21, 2014  . ROTATOR CUFF REPAIR     x 2 /right shoulder  . SKIN CANCER EXCISION     pre-melanoma / on face  . TEE WITHOUT CARDIOVERSION N/A 03/07/2016   Procedure: TRANSESOPHAGEAL ECHOCARDIOGRAM (TEE);  Surgeon: Adrian Prows, MD;  Location: [place];  Service: Cardiovascular;  Laterality: N/A;  . TOE SURGERY     rt foot     There were no vitals filed for this visit.  Subjective Assessment - 05/04/19 1333    Subjective  Patient reports having a really bad crick in her neck over the weekend, reports that carrying groceries did cause some pain over the weekend in her back    Currently in Pain?  Yes    Pain Score  4     Pain Location  Back    Pain Orientation  Right;Lower    Aggravating  [place], [place], [address] Phone: [phone]   Fax:  [phone]  Physical Therapy Treatment  Patient Details  Name: Linda Mcgrath MRN: [id number] Date of Birth: Jul 24, 1933 Referring Provider (PT): [name]   Encounter Date: 05/04/2019  PT End of Session - 05/04/19 1408    Visit Number  6    Date for PT Re-Evaluation  06/14/19    PT Start Time  1332    PT Stop Time  1425    PT Time Calculation (min)  53 min    Activity Tolerance  Patient tolerated treatment well    Behavior During Therapy  [facility] for tasks assessed/performed       Past Medical History:  Diagnosis Date  . Allergic rhinitis   . Asthma   . Cataract    Bil/lens implant  . Colon polyp 1999   small polyp  . Diverticulosis   . Dysrhythmia   . Fibromyalgia   . Hyperlipidemia   . Hypothyroidism   . Internal hemorrhoids   . Leg cramps   . Lung nodule    stable LUL 9 mm  . Menopausal syndrome   . Osteoarthritis   . UTI (urinary tract infection)     Past Surgical History:  Procedure Laterality Date  . Abd U/S  11/1998   negative  . Abd U/S  01/2001   gallbladder polyps  . ABDOMINAL HYSTERECTOMY  1975   total-fibroids  . APPENDECTOMY  1975  . BREAST BIOPSY     benign  . BREAST EXCISIONAL BIOPSY Left 1957  . CARDIOVERSION N/A 03/07/2016   Procedure: CARDIOVERSION;  Surgeon: Adrian Prows, MD;  Location: [place];  Service: Cardiovascular;  Laterality: N/A;  . CATARACT EXTRACTION     bilateral  . CHOLECYSTECTOMY    . COLONOSCOPY  1998   Diverticulosis; polyp\  . COLONOSCOPY  09/2002   Diverticulosis, hem  . COLONOSCOPY  12/2007   diverticulosis, polyp  . DEXA  10/2001   osteopenia  . ELECTROPHYSIOLOGIC STUDY N/A 04/03/2016   Procedure: A-Flutter Ablation;  Surgeon: Will Meredith Leeds, MD;  Location: [place] [facility];  Service: Cardiovascular;  Laterality: N/A;  . ESOPHAGOGASTRODUODENOSCOPY  2004  . Hida  scan  01/2001   Negative  . KNEE SURGERY     right knee / 11/2004  . LAMINECTOMY WITH POSTERIOR LATERAL ARTHRODESIS LEVEL 2 N/A 07/31/2017   Procedure: DECOMPRESSIVE LAMINECTOMY LUMABR FOUR-FIVE, LUMBAR FIVE-SACRAL ONE;  Surgeon: Eustace Moore, MD;  Location: [place];  Service: Neurosurgery;  Laterality: N/A;  . laser surgery for glaucoma Left Sep 21, 2014  . ROTATOR CUFF REPAIR     x 2 /right shoulder  . SKIN CANCER EXCISION     pre-melanoma / on face  . TEE WITHOUT CARDIOVERSION N/A 03/07/2016   Procedure: TRANSESOPHAGEAL ECHOCARDIOGRAM (TEE);  Surgeon: Adrian Prows, MD;  Location: [place];  Service: Cardiovascular;  Laterality: N/A;  . TOE SURGERY     rt foot     There were no vitals filed for this visit.  Subjective Assessment - 05/04/19 1333    Subjective  Patient reports having a really bad crick in her neck over the weekend, reports that carrying groceries did cause some pain over the weekend in her back    Currently in Pain?  Yes    Pain Score  4     Pain Location  Back    Pain Orientation  Right;Lower    Aggravating

## 2019-05-06 ENCOUNTER — Encounter: Payer: Self-pay | Admitting: Physical Therapy

## 2019-05-06 ENCOUNTER — Other Ambulatory Visit: Payer: Self-pay

## 2019-05-06 ENCOUNTER — Ambulatory Visit: Payer: Medicare Other | Admitting: Physical Therapy

## 2019-05-06 DIAGNOSIS — R262 Difficulty in walking, not elsewhere classified: Secondary | ICD-10-CM | POA: Diagnosis not present

## 2019-05-06 DIAGNOSIS — M5441 Lumbago with sciatica, right side: Secondary | ICD-10-CM

## 2019-05-06 DIAGNOSIS — M62830 Muscle spasm of back: Secondary | ICD-10-CM

## 2019-05-06 NOTE — Therapy (Signed)
Problem List Patient Active Problem List   Diagnosis Date Noted  . Diarrhea 07/03/2018  . Dyspepsia 07/03/2018  . S/P lumbar spinal fusion 07/31/2017  . Right low back pain 03/04/2017  . H/O atrial flutter 03/06/2016  . Obesity 11/06/2015  . Urge incontinence 01/24/2015  . Estrogen deficiency 10/25/2014  . Lichen planus 71/99/4129  . Encounter for Medicare annual wellness exam 06/17/2013  . Screening mammogram, encounter for 06/15/2012  . Prediabetes 06/03/2011  . HYPERTENSION, BENIGN ESSENTIAL 10/23/2010  . CONSTIPATION 10/23/2010  . HEMATOCHEZIA 10/23/2010  . OSTEOARTHRITIS, HANDS, BILATERAL 01/24/2010  . Hypothyroidism 08/24/2008  . Vitamin D deficiency 06/06/2008  . Hyperlipidemia 06/06/2008  . MENOPAUSAL SYNDROME 03/29/2008  . Osteopenia 03/29/2008  . ALLERGIC RHINITIS 01/25/2008  . IBS 01/25/2008  . OSTEOARTHRITIS 01/25/2008  . FIBROMYALGIA 01/25/2008    [name], PT 05/06/2019, 2:28 PM  [address] [place], [place], [address] Phone: [phone]   Fax:  [phone]  Name: Linda Mcgrath MRN: [id number] Date of Birth: [birth year]  Problem List Patient Active Problem List   Diagnosis Date Noted  . Diarrhea 07/03/2018  . Dyspepsia 07/03/2018  . S/P lumbar spinal fusion 07/31/2017  . Right low back pain 03/04/2017  . H/O atrial flutter 03/06/2016  . Obesity 11/06/2015  . Urge incontinence 01/24/2015  . Estrogen deficiency 10/25/2014  . Lichen planus 71/99/4129  . Encounter for Medicare annual wellness exam 06/17/2013  . Screening mammogram, encounter for 06/15/2012  . Prediabetes 06/03/2011  . HYPERTENSION, BENIGN ESSENTIAL 10/23/2010  . CONSTIPATION 10/23/2010  . HEMATOCHEZIA 10/23/2010  . OSTEOARTHRITIS, HANDS, BILATERAL 01/24/2010  . Hypothyroidism 08/24/2008  . Vitamin D deficiency 06/06/2008  . Hyperlipidemia 06/06/2008  . MENOPAUSAL SYNDROME 03/29/2008  . Osteopenia 03/29/2008  . ALLERGIC RHINITIS 01/25/2008  . IBS 01/25/2008  . OSTEOARTHRITIS 01/25/2008  . FIBROMYALGIA 01/25/2008    [name], PT 05/06/2019, 2:28 PM  [place] [address] [place], [place], [address] Phone: [phone]   Fax:  [phone]  Name: Linda Mcgrath MRN: [id number] Date of Birth: [birth year]  [place], [place], [address] Phone: [phone]   Fax:  [phone]  Physical Therapy Treatment  Patient Details  Name: Linda Mcgrath MRN: [id number] Date of Birth: Jan 11, 1933 Referring Provider (PT): [name]   Encounter Date: 05/06/2019  PT End of Session - 05/06/19 1424    Visit Number  7    Date for PT Re-Evaluation  06/14/19    PT Start Time  1245    PT Stop Time  1430    PT Time Calculation (min)  53 min    Activity Tolerance  Patient tolerated treatment well    Behavior During Therapy  [facility] for tasks assessed/performed       Past Medical History:  Diagnosis Date  . Allergic rhinitis   . Asthma   . Cataract    Bil/lens implant  . Colon polyp 1999   small polyp  . Diverticulosis   . Dysrhythmia   . Fibromyalgia   . Hyperlipidemia   . Hypothyroidism   . Internal hemorrhoids   . Leg cramps   . Lung nodule    stable LUL 9 mm  . Menopausal syndrome   . Osteoarthritis   . UTI (urinary tract infection)     Past Surgical History:  Procedure Laterality Date  . Abd U/S  11/1998   negative  . Abd U/S  01/2001   gallbladder polyps  . ABDOMINAL HYSTERECTOMY  1975   total-fibroids  . APPENDECTOMY  1975  . BREAST BIOPSY     benign  . BREAST EXCISIONAL BIOPSY Left 1957  . CARDIOVERSION N/A 03/07/2016   Procedure: CARDIOVERSION;  Surgeon: Adrian Prows, MD;  Location: [place];  Service: Cardiovascular;  Laterality: N/A;  . CATARACT EXTRACTION     bilateral  . CHOLECYSTECTOMY    . COLONOSCOPY  1998   Diverticulosis; polyp\  . COLONOSCOPY  09/2002   Diverticulosis, hem  . COLONOSCOPY  12/2007   diverticulosis, polyp  . DEXA  10/2001   osteopenia  . ELECTROPHYSIOLOGIC STUDY N/A 04/03/2016   Procedure: A-Flutter Ablation;  Surgeon: Will Meredith Leeds, MD;  Location: [place] [facility];  Service: Cardiovascular;  Laterality: N/A;  . ESOPHAGOGASTRODUODENOSCOPY  2004  . Hida  scan  01/2001   Negative  . KNEE SURGERY     right knee / 11/2004  . LAMINECTOMY WITH POSTERIOR LATERAL ARTHRODESIS LEVEL 2 N/A 07/31/2017   Procedure: DECOMPRESSIVE LAMINECTOMY LUMABR FOUR-FIVE, LUMBAR FIVE-SACRAL ONE;  Surgeon: Eustace Moore, MD;  Location: [place];  Service: Neurosurgery;  Laterality: N/A;  . laser surgery for glaucoma Left Sep 21, 2014  . ROTATOR CUFF REPAIR     x 2 /right shoulder  . SKIN CANCER EXCISION     pre-melanoma / on face  . TEE WITHOUT CARDIOVERSION N/A 03/07/2016   Procedure: TRANSESOPHAGEAL ECHOCARDIOGRAM (TEE);  Surgeon: Adrian Prows, MD;  Location: [place];  Service: Cardiovascular;  Laterality: N/A;  . TOE SURGERY     rt foot     There were no vitals filed for this visit.  Subjective Assessment - 05/06/19 1338    Subjective  I get sore the next day but I think that is good, I want to get back doing what I used to do and need to do    Currently in Pain?  Yes    Pain Score  5     Pain Location  Back    Pain Orientation  Lower;Right

## 2019-05-11 ENCOUNTER — Ambulatory Visit: Payer: Medicare Other | Admitting: Physical Therapy

## 2019-05-11 ENCOUNTER — Other Ambulatory Visit: Payer: Self-pay

## 2019-05-11 ENCOUNTER — Encounter: Payer: Self-pay | Admitting: Physical Therapy

## 2019-05-11 DIAGNOSIS — M62830 Muscle spasm of back: Secondary | ICD-10-CM

## 2019-05-11 DIAGNOSIS — M5441 Lumbago with sciatica, right side: Secondary | ICD-10-CM

## 2019-05-11 DIAGNOSIS — R262 Difficulty in walking, not elsewhere classified: Secondary | ICD-10-CM

## 2019-05-11 NOTE — Therapy (Signed)
[facility] [address] [place], [place], [address] Phone: [phone]   Fax:  [phone]  Physical Therapy Treatment  Patient Details  Name: Linda Mcgrath MRN: [id number] Date of Birth: [birth year] Referring Provider (PT): [name]   Encounter Date: 05/11/2019  PT End of Session - 05/11/19 1335    Visit Number  8    Date for PT Re-Evaluation  06/14/19    PT Start Time  1252    PT Stop Time  1350    PT Time Calculation (min)  58 min    Activity Tolerance  Patient tolerated treatment well       Past Medical History:  Diagnosis Date  . Allergic rhinitis   . Asthma   . Cataract    Bil/lens implant  . Colon polyp 1999   small polyp  . Diverticulosis   . Dysrhythmia   . Fibromyalgia   . Hyperlipidemia   . Hypothyroidism   . Internal hemorrhoids   . Leg cramps   . Lung nodule    stable LUL 9 mm  . Menopausal syndrome   . Osteoarthritis   . UTI (urinary tract infection)     Past Surgical History:  Procedure Laterality Date  . Abd U/S  11/1998   negative  . Abd U/S  01/2001   gallbladder polyps  . ABDOMINAL HYSTERECTOMY  1975   total-fibroids  . APPENDECTOMY  1975  . BREAST BIOPSY     benign  . BREAST EXCISIONAL BIOPSY Left 1957  . CARDIOVERSION N/A 03/07/2016   Procedure: CARDIOVERSION;  Surgeon: Yates Decamp, MD;  Location: [facility];  Service: Cardiovascular;  Laterality: N/A;  . CATARACT EXTRACTION     bilateral  . CHOLECYSTECTOMY    . COLONOSCOPY  1998   Diverticulosis; polyp\  . COLONOSCOPY  09/2002   Diverticulosis, hem  . COLONOSCOPY  12/2007   diverticulosis, polyp  . DEXA  10/2001   osteopenia  . ELECTROPHYSIOLOGIC STUDY N/A 04/03/2016   Procedure: A-Flutter Ablation;  Surgeon: Will [name], MD;  [facility];  Service: Cardiovascular;  Laterality: N/A;  . ESOPHAGOGASTRODUODENOSCOPY  2004  . Hida scan  01/2001   Negative  . KNEE SURGERY     right knee /  11/2004  . LAMINECTOMY WITH POSTERIOR LATERAL ARTHRODESIS LEVEL 2 N/A 07/31/2017   Procedure: DECOMPRESSIVE LAMINECTOMY LUMABR FOUR-FIVE, LUMBAR FIVE-SACRAL ONE;  Surgeon: Tia Alert, MD;  Location: [facility] OR;  Service: Neurosurgery;  Laterality: N/A;  . laser surgery for glaucoma Left Sep 21, 2014  . ROTATOR CUFF REPAIR     x 2 /right shoulder  . SKIN CANCER EXCISION     pre-melanoma / on face  . TEE WITHOUT CARDIOVERSION N/A 03/07/2016   Procedure: TRANSESOPHAGEAL ECHOCARDIOGRAM (TEE);  Surgeon: Yates Decamp, MD;  Location: [facility] ENDOSCOPY;  Service: Cardiovascular;  Laterality: N/A;  . TOE SURGERY     rt foot     There were no vitals filed for this visit.  Subjective Assessment - 05/11/19 1259    Subjective  I am feeling pretty good, want to do more but fear of pain limits me    Currently in Pain?  Yes    Pain Score  4     Pain Location  Back    Pain Orientation  Right;Lower                       [facility] Adult PT Treatment/Exercise -  05/11/19 0001      Ambulation/Gait   Gait Comments  used the SPC, walked down the hall, then outside and around the building with 1 rest break at the half way point.      Lumbar Exercises: Stretches   Passive Hamstring Stretch  Right;2 reps;20 seconds      Lumbar Exercises: Aerobic   Nustep  level 4 x 6 minutes      Lumbar Exercises: Standing   Row  Both;20 reps;Theraband    Theraband Level (Row)  Level 2 (Red)    Other Standing Lumbar Exercises  standing hip abduction and extension 2.5#      Lumbar Exercises: Seated   Long Arc Quad on Chair  Both;2 sets;10 reps    LAQ on Chair Weights (lbs)  2.5#    Other Seated Lumbar Exercises  isometric abdominal squeeze, ball b/n knees squeeze      Moist Heat Therapy   Number Minutes Moist Heat  15 Minutes    Moist Heat Location  Lumbar Spine      Electrical Stimulation   Electrical Stimulation Location  right low back into the right hip    Electrical Stimulation Action  IFC    Electrical  Stimulation Parameters  sitting    Electrical Stimulation Goals  Pain               PT Short Term Goals - 04/26/19 1427      PT SHORT TERM GOAL #1   Title  independent with HEP    Status  Achieved        PT Long Term Goals - 05/11/19 1337      PT LONG TERM GOAL #1   Title  decrease pain 50%    Status  Partially Met      PT LONG TERM GOAL #4   Title  tolerate shopping for groceries without an increase in pain    Status  On-going            Plan - 05/11/19 1336    Clinical Impression Statement  We walked outside and around the building today, walking was slow and she needed CGA with negotiating curbs and then when we rested she needed min A to stand due to some fatigue, overall she did well for not doing this previously.    PT Next Visit Plan  work on increasing her tolerance to activity    Consulted and Agree with Plan of Care  Patient       Patient will benefit from skilled therapeutic intervention in order to improve the following deficits and impairments:  Abnormal gait, Improper body mechanics, Pain, Postural dysfunction, Increased muscle spasms, Decreased mobility, Decreased activity tolerance, Decreased endurance, Decreased range of motion, Decreased strength, Impaired flexibility, Difficulty walking, Decreased balance  Visit Diagnosis: Acute right-sided low back pain with right-sided sciatica  Muscle spasm of back  Difficulty in walking, not elsewhere classified     Problem List Patient Active Problem List   Diagnosis Date Noted  . Diarrhea 07/03/2018  . Dyspepsia 07/03/2018  . S/P lumbar spinal fusion 07/31/2017  . Right low back pain 03/04/2017  . H/O atrial flutter 03/06/2016  . Obesity 11/06/2015  . Urge incontinence 01/24/2015  . Estrogen deficiency 10/25/2014  . Lichen planus 12/20/2013  . Encounter for Medicare annual wellness exam 06/17/2013  . Screening mammogram, encounter for 06/15/2012  . Prediabetes 06/03/2011  .  HYPERTENSION, BENIGN ESSENTIAL 10/23/2010  . CONSTIPATION 10/23/2010  . HEMATOCHEZIA 10/23/2010  . OSTEOARTHRITIS,  HANDS, BILATERAL 01/24/2010  . Hypothyroidism 08/24/2008  . Vitamin D deficiency 06/06/2008  . Hyperlipidemia 06/06/2008  . MENOPAUSAL SYNDROME 03/29/2008  . Osteopenia 03/29/2008  . ALLERGIC RHINITIS 01/25/2008  . IBS 01/25/2008  . OSTEOARTHRITIS 01/25/2008  . FIBROMYALGIA 01/25/2008    Jearld Lesch., PT 05/11/2019, 1:38 PM  [facility] [address] [place], [place], [address] Phone: [phone]   Fax:  [phone]  Name: Linda Mcgrath MRN: [id number] Date of Birth: October 09, 1932

## 2019-05-13 ENCOUNTER — Encounter: Payer: Self-pay | Admitting: Physical Therapy

## 2019-05-13 ENCOUNTER — Ambulatory Visit: Payer: Medicare Other | Admitting: Physical Therapy

## 2019-05-13 ENCOUNTER — Other Ambulatory Visit: Payer: Self-pay

## 2019-05-13 DIAGNOSIS — R262 Difficulty in walking, not elsewhere classified: Secondary | ICD-10-CM | POA: Diagnosis not present

## 2019-05-13 DIAGNOSIS — M62830 Muscle spasm of back: Secondary | ICD-10-CM | POA: Diagnosis not present

## 2019-05-13 DIAGNOSIS — M5441 Lumbago with sciatica, right side: Secondary | ICD-10-CM | POA: Diagnosis not present

## 2019-05-13 NOTE — Therapy (Signed)
order to improve the following deficits and impairments:  Abnormal gait, Improper body mechanics, Pain, Postural dysfunction, Increased muscle spasms, Decreased mobility, Decreased activity tolerance, Decreased endurance, Decreased range of motion, Decreased strength, Impaired flexibility, Difficulty walking, Decreased balance  Visit Diagnosis: Acute right-sided low back pain with right-sided sciatica  Muscle spasm of back  Difficulty in walking, not elsewhere classified     Problem List Patient Active Problem List   Diagnosis Date Noted  . Diarrhea 07/03/2018  . Dyspepsia 07/03/2018  . S/P lumbar spinal fusion 07/31/2017  . Right low back pain 03/04/2017  . H/O atrial flutter 03/06/2016  . Obesity 11/06/2015  . Urge incontinence 01/24/2015  . Estrogen deficiency 10/25/2014  . Lichen planus 21/30/8657  . Encounter for Medicare annual wellness exam 06/17/2013  . Screening mammogram, encounter for 06/15/2012  . Prediabetes 06/03/2011  . HYPERTENSION, BENIGN ESSENTIAL 10/23/2010  . CONSTIPATION 10/23/2010  . HEMATOCHEZIA 10/23/2010  . OSTEOARTHRITIS, HANDS, BILATERAL 01/24/2010  . Hypothyroidism 08/24/2008  . Vitamin D deficiency 06/06/2008  . Hyperlipidemia 06/06/2008  . MENOPAUSAL SYNDROME 03/29/2008  . Osteopenia 03/29/2008  . ALLERGIC RHINITIS 01/25/2008  . IBS 01/25/2008  . OSTEOARTHRITIS 01/25/2008  . FIBROMYALGIA 01/25/2008    Linda Boast., PT 05/13/2019, 2:31 PM  [address] [place], [place], [address] Phone: [phone]   Fax:  [phone]  Name: Linda Mcgrath MRN: [id number] Date of Birth: 1933/03/11  order to improve the following deficits and impairments:  Abnormal gait, Improper body mechanics, Pain, Postural dysfunction, Increased muscle spasms, Decreased mobility, Decreased activity tolerance, Decreased endurance, Decreased range of motion, Decreased strength, Impaired flexibility, Difficulty walking, Decreased balance  Visit Diagnosis: Acute right-sided low back pain with right-sided sciatica  Muscle spasm of back  Difficulty in walking, not elsewhere classified     Problem List Patient Active Problem List   Diagnosis Date Noted  . Diarrhea 07/03/2018  . Dyspepsia 07/03/2018  . S/P lumbar spinal fusion 07/31/2017  . Right low back pain 03/04/2017  . H/O atrial flutter 03/06/2016  . Obesity 11/06/2015  . Urge incontinence 01/24/2015  . Estrogen deficiency 10/25/2014  . Lichen planus 21/30/8657  . Encounter for Medicare annual wellness exam 06/17/2013  . Screening mammogram, encounter for 06/15/2012  . Prediabetes 06/03/2011  . HYPERTENSION, BENIGN ESSENTIAL 10/23/2010  . CONSTIPATION 10/23/2010  . HEMATOCHEZIA 10/23/2010  . OSTEOARTHRITIS, HANDS, BILATERAL 01/24/2010  . Hypothyroidism 08/24/2008  . Vitamin D deficiency 06/06/2008  . Hyperlipidemia 06/06/2008  . MENOPAUSAL SYNDROME 03/29/2008  . Osteopenia 03/29/2008  . ALLERGIC RHINITIS 01/25/2008  . IBS 01/25/2008  . OSTEOARTHRITIS 01/25/2008  . FIBROMYALGIA 01/25/2008    Linda Boast., PT 05/13/2019, 2:31 PM  [address] [place], [place], [address] Phone: [phone]   Fax:  [phone]  Name: Linda Mcgrath MRN: [id number] Date of Birth: 1933/03/11  [address] [place], [place], [address] Phone: [phone]   Fax:  [phone]  Physical Therapy Treatment  Patient Details  Name: Linda Mcgrath MRN: [id number] Date of Birth: [birth year] Referring Provider (PT): [name]   Encounter Date: 05/13/2019  PT End of Session - 05/13/19 1427    Visit Number  9    Date for PT Re-Evaluation  06/14/19    PT Start Time  1350    PT Stop Time  1438    PT Time Calculation (min)  48 min    Activity Tolerance  Patient tolerated treatment well    Behavior During Therapy  [facility] for tasks assessed/performed       Past Medical History:  Diagnosis Date  . Allergic rhinitis   . Asthma   . Cataract    Bil/lens implant  . Colon polyp 1999   small polyp  . Diverticulosis   . Dysrhythmia   . Fibromyalgia   . Hyperlipidemia   . Hypothyroidism   . Internal hemorrhoids   . Leg cramps   . Lung nodule    stable LUL 9 mm  . Menopausal syndrome   . Osteoarthritis   . UTI (urinary tract infection)     Past Surgical History:  Procedure Laterality Date  . Abd U/S  11/1998   negative  . Abd U/S  01/2001   gallbladder polyps  . ABDOMINAL HYSTERECTOMY  1975   total-fibroids  . APPENDECTOMY  1975  . BREAST BIOPSY     benign  . BREAST EXCISIONAL BIOPSY Left 1957  . CARDIOVERSION N/A 03/07/2016   Procedure: CARDIOVERSION;  Surgeon: Adrian Prows, MD;  Location: [place];  Service: Cardiovascular;  Laterality: N/A;  . CATARACT EXTRACTION     bilateral  . CHOLECYSTECTOMY    . COLONOSCOPY  1998   Diverticulosis; polyp\  . COLONOSCOPY  09/2002   Diverticulosis, hem  . COLONOSCOPY  12/2007   diverticulosis, polyp  . DEXA  10/2001   osteopenia  . ELECTROPHYSIOLOGIC STUDY N/A 04/03/2016   Procedure: A-Flutter Ablation;  Surgeon: Will Meredith Leeds, MD;  Location: [place] [facility];  Service: Cardiovascular;  Laterality: N/A;  . ESOPHAGOGASTRODUODENOSCOPY  2004  . Hida  scan  01/2001   Negative  . KNEE SURGERY     right knee / 11/2004  . LAMINECTOMY WITH POSTERIOR LATERAL ARTHRODESIS LEVEL 2 N/A 07/31/2017   Procedure: DECOMPRESSIVE LAMINECTOMY LUMABR FOUR-FIVE, LUMBAR FIVE-SACRAL ONE;  Surgeon: Eustace Moore, MD;  Location: [place];  Service: Neurosurgery;  Laterality: N/A;  . laser surgery for glaucoma Left Sep 21, 2014  . ROTATOR CUFF REPAIR     x 2 /right shoulder  . SKIN CANCER EXCISION     pre-melanoma / on face  . TEE WITHOUT CARDIOVERSION N/A 03/07/2016   Procedure: TRANSESOPHAGEAL ECHOCARDIOGRAM (TEE);  Surgeon: Adrian Prows, MD;  Location: [place];  Service: Cardiovascular;  Laterality: N/A;  . TOE SURGERY     rt foot     There were no vitals filed for this visit.  Subjective Assessment - 05/13/19 1354    Subjective  Reports that she was a little tired after the walking last visit, reports that she was able to shop and carry groceries yesterday.    Currently in Pain?  Yes    Pain Score  5     Pain Location  Back    Pain Orientation  Right;Lower    Pain Descriptors / Indicators

## 2019-05-18 ENCOUNTER — Ambulatory Visit: Payer: Medicare Other | Admitting: Physical Therapy

## 2019-05-18 ENCOUNTER — Other Ambulatory Visit: Payer: Self-pay

## 2019-05-18 ENCOUNTER — Encounter: Payer: Self-pay | Admitting: Physical Therapy

## 2019-05-18 DIAGNOSIS — M5441 Lumbago with sciatica, right side: Secondary | ICD-10-CM | POA: Diagnosis not present

## 2019-05-18 DIAGNOSIS — R262 Difficulty in walking, not elsewhere classified: Secondary | ICD-10-CM | POA: Diagnosis not present

## 2019-05-18 DIAGNOSIS — M62830 Muscle spasm of back: Secondary | ICD-10-CM | POA: Diagnosis not present

## 2019-05-18 NOTE — Therapy (Signed)
[address] [place], [place], [address] Phone: [phone]   Fax:  [phone] Progress Note Reporting Period 04/14/19 to 05/18/19 for the first 10 visits  See note below for Objective Data and Assessment of Progress/Goals.      Physical Therapy Treatment  Patient Details  Name: Linda Mcgrath MRN: [id number] Date of Birth: 07/09/1933 Referring Provider (PT): [name]   Encounter Date: 05/18/2019  PT End of Session - 05/18/19 1332    Visit Number  10    Date for PT Re-Evaluation  06/14/19    PT Start Time  1250    PT Stop Time  1350    PT Time Calculation (min)  60 min    Activity Tolerance  Patient tolerated treatment well    Behavior During Therapy  [facility] for tasks assessed/performed       Past Medical History:  Diagnosis Date  . Allergic rhinitis   . Asthma   . Cataract    Bil/lens implant  . Colon polyp 1999   small polyp  . Diverticulosis   . Dysrhythmia   . Fibromyalgia   . Hyperlipidemia   . Hypothyroidism   . Internal hemorrhoids   . Leg cramps   . Lung nodule    stable LUL 9 mm  . Menopausal syndrome   . Osteoarthritis   . UTI (urinary tract infection)     Past Surgical History:  Procedure Laterality Date  . Abd U/S  11/1998   negative  . Abd U/S  01/2001   gallbladder polyps  . ABDOMINAL HYSTERECTOMY  1975   total-fibroids  . APPENDECTOMY  1975  . BREAST BIOPSY     benign  . BREAST EXCISIONAL BIOPSY Left 1957  . CARDIOVERSION N/A 03/07/2016   Procedure: CARDIOVERSION;  Surgeon: Adrian Prows, MD;  Location: [place];  Service: Cardiovascular;  Laterality: N/A;  . CATARACT EXTRACTION     bilateral  . CHOLECYSTECTOMY    . COLONOSCOPY  1998   Diverticulosis; polyp\  . COLONOSCOPY  09/2002   Diverticulosis, hem  . COLONOSCOPY  12/2007   diverticulosis, polyp  . DEXA  10/2001   osteopenia  . ELECTROPHYSIOLOGIC STUDY N/A 04/03/2016   Procedure: A-Flutter Ablation;   Surgeon: Will Meredith Leeds, MD;  Location: [place] CV [facility];  Service: Cardiovascular;  Laterality: N/A;  . ESOPHAGOGASTRODUODENOSCOPY  2004  . Hida scan  01/2001   Negative  . KNEE SURGERY     right knee / 11/2004  . LAMINECTOMY WITH POSTERIOR LATERAL ARTHRODESIS LEVEL 2 N/A 07/31/2017   Procedure: DECOMPRESSIVE LAMINECTOMY LUMABR FOUR-FIVE, LUMBAR FIVE-SACRAL ONE;  Surgeon: Eustace Moore, MD;  Location: [place];  Service: Neurosurgery;  Laterality: N/A;  . laser surgery for glaucoma Left Sep 21, 2014  . ROTATOR CUFF REPAIR     x 2 /right shoulder  . SKIN CANCER EXCISION     pre-melanoma / on face  . TEE WITHOUT CARDIOVERSION N/A 03/07/2016   Procedure: TRANSESOPHAGEAL ECHOCARDIOGRAM (TEE);  Surgeon: Adrian Prows, MD;  Location: [place];  Service: Cardiovascular;  Laterality: N/A;  . TOE SURGERY     rt foot     There were no vitals filed for this visit.  Subjective Assessment - 05/18/19 1301    Subjective  REports that she tried to do some walking at home on Saturday, reports that she felt fine, just fatigued    Currently in Pain?  Yes    Pain Score  4  Problem  List Patient Active Problem List   Diagnosis Date Noted  . Diarrhea 07/03/2018  . Dyspepsia 07/03/2018  . S/P lumbar spinal fusion 07/31/2017  . Right low back pain 03/04/2017  . H/O atrial flutter 03/06/2016  . Obesity 11/06/2015  . Urge incontinence 01/24/2015  . Estrogen deficiency 10/25/2014  . Lichen planus 70/92/9574  . Encounter for Medicare annual wellness exam 06/17/2013  . Screening mammogram, encounter for 06/15/2012  . Prediabetes 06/03/2011  . HYPERTENSION, BENIGN ESSENTIAL 10/23/2010  . CONSTIPATION 10/23/2010  . HEMATOCHEZIA 10/23/2010  . OSTEOARTHRITIS, HANDS, BILATERAL 01/24/2010  . Hypothyroidism 08/24/2008  . Vitamin D deficiency 06/06/2008  . Hyperlipidemia 06/06/2008  . MENOPAUSAL SYNDROME 03/29/2008  . Osteopenia 03/29/2008  . ALLERGIC RHINITIS 01/25/2008  . IBS 01/25/2008  . OSTEOARTHRITIS 01/25/2008  . FIBROMYALGIA 01/25/2008    [name], PT 05/18/2019, 1:36 PM  [address] [place], [place], [address] Phone: [phone]   Fax:  [phone]  Name: Linda Mcgrath MRN: [id number] Date of Birth: [birth year]  Problem  List Patient Active Problem List   Diagnosis Date Noted  . Diarrhea 07/03/2018  . Dyspepsia 07/03/2018  . S/P lumbar spinal fusion 07/31/2017  . Right low back pain 03/04/2017  . H/O atrial flutter 03/06/2016  . Obesity 11/06/2015  . Urge incontinence 01/24/2015  . Estrogen deficiency 10/25/2014  . Lichen planus 70/92/9574  . Encounter for Medicare annual wellness exam 06/17/2013  . Screening mammogram, encounter for 06/15/2012  . Prediabetes 06/03/2011  . HYPERTENSION, BENIGN ESSENTIAL 10/23/2010  . CONSTIPATION 10/23/2010  . HEMATOCHEZIA 10/23/2010  . OSTEOARTHRITIS, HANDS, BILATERAL 01/24/2010  . Hypothyroidism 08/24/2008  . Vitamin D deficiency 06/06/2008  . Hyperlipidemia 06/06/2008  . MENOPAUSAL SYNDROME 03/29/2008  . Osteopenia 03/29/2008  . ALLERGIC RHINITIS 01/25/2008  . IBS 01/25/2008  . OSTEOARTHRITIS 01/25/2008  . FIBROMYALGIA 01/25/2008    [name], PT 05/18/2019, 1:36 PM  [address] [place], [place], [address] Phone: [phone]   Fax:  [phone]  Name: Linda Mcgrath MRN: [id number] Date of Birth: [birth year]

## 2019-05-20 ENCOUNTER — Ambulatory Visit: Payer: Medicare Other | Admitting: Physical Therapy

## 2019-05-20 ENCOUNTER — Other Ambulatory Visit: Payer: Self-pay

## 2019-05-20 ENCOUNTER — Encounter: Payer: Self-pay | Admitting: Physical Therapy

## 2019-05-20 DIAGNOSIS — M5441 Lumbago with sciatica, right side: Secondary | ICD-10-CM | POA: Diagnosis not present

## 2019-05-20 DIAGNOSIS — M62830 Muscle spasm of back: Secondary | ICD-10-CM | POA: Diagnosis not present

## 2019-05-20 DIAGNOSIS — R262 Difficulty in walking, not elsewhere classified: Secondary | ICD-10-CM | POA: Diagnosis not present

## 2019-05-20 NOTE — Therapy (Signed)
[facility] [place] [address] [place], [place], [address] Phone: [phone]   Fax:  [phone]  Physical Therapy Treatment  Patient Details  Name: Linda Mcgrath MRN: [id number] Date of Birth: 01/02/1933 Referring Provider (PT): [name]   Encounter Date: 05/20/2019  PT End of Session - 05/20/19 1424    Visit Number  11    Date for PT Re-Evaluation  06/14/19    PT Start Time  1340    PT Stop Time  1430    PT Time Calculation (min)  50 min    Activity Tolerance  Patient tolerated treatment well    Behavior During Therapy  [facility] for tasks assessed/performed       Past Medical History:  Diagnosis Date  . Allergic rhinitis   . Asthma   . Cataract    Bil/lens implant  . Colon polyp 1999   small polyp  . Diverticulosis   . Dysrhythmia   . Fibromyalgia   . Hyperlipidemia   . Hypothyroidism   . Internal hemorrhoids   . Leg cramps   . Lung nodule    stable LUL 9 mm  . Menopausal syndrome   . Osteoarthritis   . UTI (urinary tract infection)     Past Surgical History:  Procedure Laterality Date  . Abd U/S  11/1998   negative  . Abd U/S  01/2001   gallbladder polyps  . ABDOMINAL HYSTERECTOMY  1975   total-fibroids  . APPENDECTOMY  1975  . BREAST BIOPSY     benign  . BREAST EXCISIONAL BIOPSY Left 1957  . CARDIOVERSION N/A 03/07/2016   Procedure: CARDIOVERSION;  Surgeon: Yates Decamp, MD;  Location: [facility] ENDOS[facility];  Service: Cardiovascular;  Laterality: N/A;  . CATARACT EXTRACTION     bilateral  . CHOLECYSTECTOMY    . COLONOSCOPY  1998   Diverticulosis; polyp\  . COLONOSCOPY  09/2002   Diverticulosis, hem  . COLONOSCOPY  12/2007   diverticulosis, polyp  . DEXA  10/2001   osteopenia  . ELECTROPHYSIOLOGIC STUDY N/A 04/03/2016   Procedure: A-Flutter Ablation;  Surgeon: Will [name], MD;  [facility];  Service: Cardiovascular;  Laterality: N/A;  . ESOPHAGOGASTRODUODENOSCOPY  2004  . Hida  scan  01/2001   Negative  . KNEE SURGERY     right knee / 11/2004  . LAMINECTOMY WITH POSTERIOR LATERAL ARTHRODESIS LEVEL 2 N/A 07/31/2017   Procedure: DECOMPRESSIVE LAMINECTOMY LUMABR FOUR-FIVE, LUMBAR FIVE-SACRAL ONE;  Surgeon: Tia Alert, MD;  Location: [facility] OR;  Service: Neurosurgery;  Laterality: N/A;  . laser surgery for glaucoma Left Sep 21, 2014  . ROTATOR CUFF REPAIR     x 2 /right shoulder  . SKIN CANCER EXCISION     pre-melanoma / on face  . TEE WITHOUT CARDIOVERSION N/A 03/07/2016   Procedure: TRANSESOPHAGEAL ECHOCARDIOGRAM (TEE);  Surgeon: Yates Decamp, MD;  Location: [facility] ENDOSCOPY;  Service: Cardiovascular;  Laterality: N/A;  . TOE SURGERY     rt foot     There were no vitals filed for this visit.  Subjective Assessment - 05/20/19 1355    Subjective  Patient reports that she was really wore out after the treatment the other day and tehn had to shop some    Currently in Pain?  Yes    Pain Score  4     Pain Location  Back    Pain Orientation  Right;Lower    Aggravating Factors   bending and walking  [facility] Adult PT Treatment/Exercise - 05/20/19 0001      Lumbar Exercises: Aerobic   Nustep  level 5 x 6 minutes      Lumbar Exercises: Machines for Strengthening   Other Lumbar Machine Exercise  seated row 10#, lats 10#      Lumbar Exercises: Standing   Other Standing Lumbar Exercises  standing hip abduction and extension 2.5# flexion       Lumbar Exercises: Seated   Long Arc Quad on Chair  Both;2 sets;10 reps    LAQ on Chair Weights (lbs)  2.5#    Hip Flexion on Ball  Both;20 reps    Hip Flexion on Ball Limitations  not on ball on mat with 2.5#    Other Seated Lumbar Exercises  isometric abdominal squeeze, ball b/n knees squeeze    Other Seated Lumbar Exercises  pelvic mobility on sit fit with red tband scap stabilization 2ways, weighted ball lift from chest to overhead, red tband knee flexion, ankle pumps      Modalities    Modalities  Electrical Stimulation;Moist Heat      Moist Heat Therapy   Number Minutes Moist Heat  15 Minutes    Moist Heat Location  Lumbar Spine      Electrical Stimulation   Electrical Stimulation Location  right low back into the right hip    Electrical Stimulation Action  IFC    Electrical Stimulation Parameters  sitting    Electrical Stimulation Goals  Pain               PT Short Term Goals - 04/26/19 1427      PT SHORT TERM GOAL #1   Title  independent with HEP    Status  Achieved        PT Long Term Goals - 05/20/19 1426      PT LONG TERM GOAL #1   Title  decrease pain 50%    Status  Partially Met      PT LONG TERM GOAL #2   Title  Increase lumbar AROM 25% in extension, side-bending, and rotation.     Status  Achieved            Plan - 05/20/19 1425    Clinical Impression Statement  Patient doing well and able to do more and more, she does get very tired at times especially with activities, she still is limited with some housework and yardwork.    PT Next Visit Plan  work on increasing her tolerance to activity    Consulted and Agree with Plan of Care  Patient       Patient will benefit from skilled therapeutic intervention in order to improve the following deficits and impairments:  Abnormal gait, Improper body mechanics, Pain, Postural dysfunction, Increased muscle spasms, Decreased mobility, Decreased activity tolerance, Decreased endurance, Decreased range of motion, Decreased strength, Impaired flexibility, Difficulty walking, Decreased balance  Visit Diagnosis: Acute right-sided low back pain with right-sided sciatica  Muscle spasm of back  Difficulty in walking, not elsewhere classified     Problem List Patient Active Problem List   Diagnosis Date Noted  . Diarrhea 07/03/2018  . Dyspepsia 07/03/2018  . S/P lumbar spinal fusion 07/31/2017  . Right low back pain 03/04/2017  . H/O atrial flutter 03/06/2016  . Obesity 11/06/2015   . Urge incontinence 01/24/2015  . Estrogen deficiency 10/25/2014  . Lichen planus 12/20/2013  . Encounter for Medicare annual wellness exam 06/17/2013  . Screening mammogram, encounter for 06/15/2012  .  Prediabetes 06/03/2011  . HYPERTENSION, BENIGN ESSENTIAL 10/23/2010  . CONSTIPATION 10/23/2010  . HEMATOCHEZIA 10/23/2010  . OSTEOARTHRITIS, HANDS, BILATERAL 01/24/2010  . Hypothyroidism 08/24/2008  . Vitamin D deficiency 06/06/2008  . Hyperlipidemia 06/06/2008  . MENOPAUSAL SYNDROME 03/29/2008  . Osteopenia 03/29/2008  . ALLERGIC RHINITIS 01/25/2008  . IBS 01/25/2008  . OSTEOARTHRITIS 01/25/2008  . FIBROMYALGIA 01/25/2008    Jearld Lesch., PT 05/20/2019, 2:27 PM  [facility] [place] [address] [place], [place], [address] Phone: [phone]   Fax:  [phone]  Name: Linda Mcgrath MRN: [id number] Date of Birth: [birth year]

## 2019-05-24 ENCOUNTER — Ambulatory Visit: Payer: Medicare Other | Admitting: Physical Therapy

## 2019-05-24 ENCOUNTER — Encounter: Payer: Self-pay | Admitting: Physical Therapy

## 2019-05-24 ENCOUNTER — Encounter: Payer: Medicare Other | Admitting: Physical Therapy

## 2019-05-24 ENCOUNTER — Other Ambulatory Visit: Payer: Self-pay

## 2019-05-24 DIAGNOSIS — M62830 Muscle spasm of back: Secondary | ICD-10-CM

## 2019-05-24 DIAGNOSIS — M5441 Lumbago with sciatica, right side: Secondary | ICD-10-CM

## 2019-05-24 DIAGNOSIS — R262 Difficulty in walking, not elsewhere classified: Secondary | ICD-10-CM | POA: Diagnosis not present

## 2019-05-24 NOTE — Therapy (Signed)
[address], [place], [address] Phone: [phone]   Fax:  [phone]  Physical Therapy Treatment  Patient Details  Name: ADLEE PAAR MRN: [id number] Date of Birth: 29-Oct-1932 Referring Provider (PT): [name]   Encounter Date: 05/24/2019  PT End of Session - 05/24/19 1733    Visit Number  12    Date for PT Re-Evaluation  06/14/19    PT Start Time  1645    PT Stop Time  1745    PT Time Calculation (min)  60 min    Activity Tolerance  Patient tolerated treatment well    Behavior During Therapy  [facility] for tasks assessed/performed       Past Medical History:  Diagnosis Date  . Allergic rhinitis   . Asthma   . Cataract    Bil/lens implant  . Colon polyp 1999   small polyp  . Diverticulosis   . Dysrhythmia   . Fibromyalgia   . Hyperlipidemia   . Hypothyroidism   . Internal hemorrhoids   . Leg cramps   . Lung nodule    stable LUL 9 mm  . Menopausal syndrome   . Osteoarthritis   . UTI (urinary tract infection)     Past Surgical History:  Procedure Laterality Date  . Abd U/S  11/1998   negative  . Abd U/S  01/2001   gallbladder polyps  . ABDOMINAL HYSTERECTOMY  1975   total-fibroids  . APPENDECTOMY  1975  . BREAST BIOPSY     benign  . BREAST EXCISIONAL BIOPSY Left 1957  . CARDIOVERSION N/A 03/07/2016   Procedure: CARDIOVERSION;  Surgeon: Adrian Prows, MD;  Location: [place];  Service: Cardiovascular;  Laterality: N/A;  . CATARACT EXTRACTION     bilateral  . CHOLECYSTECTOMY    . COLONOSCOPY  1998   Diverticulosis; polyp\  . COLONOSCOPY  09/2002   Diverticulosis, hem  . COLONOSCOPY  12/2007   diverticulosis, polyp  . DEXA  10/2001   osteopenia  . ELECTROPHYSIOLOGIC STUDY N/A 04/03/2016   Procedure: A-Flutter Ablation;  Surgeon: Will Meredith Leeds, MD;  Location: [place] CV LAB;  Service: Cardiovascular;  Laterality: N/A;  . ESOPHAGOGASTRODUODENOSCOPY  2004  . Hida  scan  01/2001   Negative  . KNEE SURGERY     right knee / 11/2004  . LAMINECTOMY WITH POSTERIOR LATERAL ARTHRODESIS LEVEL 2 N/A 07/31/2017   Procedure: DECOMPRESSIVE LAMINECTOMY LUMABR FOUR-FIVE, LUMBAR FIVE-SACRAL ONE;  Surgeon: Eustace Moore, MD;  Location: [place];  Service: Neurosurgery;  Laterality: N/A;  . laser surgery for glaucoma Left Sep 21, 2014  . ROTATOR CUFF REPAIR     x 2 /right shoulder  . SKIN CANCER EXCISION     pre-melanoma / on face  . TEE WITHOUT CARDIOVERSION N/A 03/07/2016   Procedure: TRANSESOPHAGEAL ECHOCARDIOGRAM (TEE);  Surgeon: Adrian Prows, MD;  Location: [place];  Service: Cardiovascular;  Laterality: N/A;  . TOE SURGERY     rt foot     There were no vitals filed for this visit.  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  Yes    Pain Score  5     Pain Location  Back    Pain Orientation  [place], [place], [address] Phone: [phone]   Fax:  [phone]  Name: OMOLOLA MITTMAN MRN: [id number] Date of Birth: 02-17-1933  [place], [place], [address] Phone: [phone]   Fax:  [phone]  Name: OMOLOLA MITTMAN MRN: [id number] Date of Birth: 02-17-1933

## 2019-05-27 ENCOUNTER — Other Ambulatory Visit: Payer: Self-pay

## 2019-05-27 ENCOUNTER — Ambulatory Visit: Payer: Medicare Other | Attending: Family Medicine | Admitting: Physical Therapy

## 2019-05-27 ENCOUNTER — Encounter: Payer: Self-pay | Admitting: Physical Therapy

## 2019-05-27 DIAGNOSIS — M545 Low back pain: Secondary | ICD-10-CM | POA: Insufficient documentation

## 2019-05-27 DIAGNOSIS — M5441 Lumbago with sciatica, right side: Secondary | ICD-10-CM | POA: Insufficient documentation

## 2019-05-27 DIAGNOSIS — G8929 Other chronic pain: Secondary | ICD-10-CM | POA: Insufficient documentation

## 2019-05-27 DIAGNOSIS — M62830 Muscle spasm of back: Secondary | ICD-10-CM | POA: Insufficient documentation

## 2019-05-27 DIAGNOSIS — R262 Difficulty in walking, not elsewhere classified: Secondary | ICD-10-CM | POA: Diagnosis not present

## 2019-05-27 NOTE — Therapy (Signed)
Medicare annual wellness exam 06/17/2013  . Screening mammogram, encounter for 06/15/2012  . Prediabetes 06/03/2011  . HYPERTENSION, BENIGN ESSENTIAL 10/23/2010  . CONSTIPATION 10/23/2010  . HEMATOCHEZIA 10/23/2010  . OSTEOARTHRITIS, HANDS, BILATERAL 01/24/2010  . Hypothyroidism 08/24/2008  . Vitamin D deficiency 06/06/2008  . Hyperlipidemia 06/06/2008  . MENOPAUSAL SYNDROME 03/29/2008  . Osteopenia 03/29/2008  . ALLERGIC RHINITIS 01/25/2008  . IBS 01/25/2008  . OSTEOARTHRITIS 01/25/2008  . FIBROMYALGIA 01/25/2008    [name], PT 05/27/2019, 2:35 PM  [address] [place], [place], [address] Phone: [phone]   Fax:  [phone]  Name: MICHILLE MCELRATH MRN: [id number] Date of Birth: [birth year]  [place] [place] [address] [place], [place], [address] Phone: [phone]   Fax:  [phone]  Physical Therapy Treatment  Patient Details  Name: Linda Mcgrath MRN: [id number] Date of Birth: 16-Jan-1933 Referring Provider (PT): [name]   Encounter Date: 05/27/2019  PT End of Session - 05/27/19 1431    Visit Number  13    Date for PT Re-Evaluation  06/14/19    PT Start Time  1350    PT Stop Time  1440    PT Time Calculation (min)  50 min    Activity Tolerance  Patient tolerated treatment well    Behavior During Therapy  [facility] for tasks assessed/performed       Past Medical History:  Diagnosis Date  . Allergic rhinitis   . Asthma   . Cataract    Bil/lens implant  . Colon polyp 1999   small polyp  . Diverticulosis   . Dysrhythmia   . Fibromyalgia   . Hyperlipidemia   . Hypothyroidism   . Internal hemorrhoids   . Leg cramps   . Lung nodule    stable LUL 9 mm  . Menopausal syndrome   . Osteoarthritis   . UTI (urinary tract infection)     Past Surgical History:  Procedure Laterality Date  . Abd U/S  11/1998   negative  . Abd U/S  01/2001   gallbladder polyps  . ABDOMINAL HYSTERECTOMY  1975   total-fibroids  . APPENDECTOMY  1975  . BREAST BIOPSY     benign  . BREAST EXCISIONAL BIOPSY Left 1957  . CARDIOVERSION N/A 03/07/2016   Procedure: CARDIOVERSION;  Surgeon: Adrian Prows, MD;  Location: [place];  Service: Cardiovascular;  Laterality: N/A;  . CATARACT EXTRACTION     bilateral  . CHOLECYSTECTOMY    . COLONOSCOPY  1998   Diverticulosis; polyp\  . COLONOSCOPY  09/2002   Diverticulosis, hem  . COLONOSCOPY  12/2007   diverticulosis, polyp  . DEXA  10/2001   osteopenia  . ELECTROPHYSIOLOGIC STUDY N/A 04/03/2016   Procedure: A-Flutter Ablation;  Surgeon: Will Meredith Leeds, MD;  Location: [facility];  Service: Cardiovascular;  Laterality: N/A;  . ESOPHAGOGASTRODUODENOSCOPY  2004  . [place]  scan  01/2001   Negative  . KNEE SURGERY     right knee / 11/2004  . LAMINECTOMY WITH POSTERIOR LATERAL ARTHRODESIS LEVEL 2 N/A 07/31/2017   Procedure: DECOMPRESSIVE LAMINECTOMY LUMABR FOUR-FIVE, LUMBAR FIVE-SACRAL ONE;  Surgeon: Eustace Moore, MD;  Location: [place];  Service: Neurosurgery;  Laterality: N/A;  . laser surgery for glaucoma Left Sep 21, 2014  . ROTATOR CUFF REPAIR     x 2 /right shoulder  . SKIN CANCER EXCISION     pre-melanoma / on face  . TEE WITHOUT CARDIOVERSION N/A 03/07/2016   Procedure: TRANSESOPHAGEAL ECHOCARDIOGRAM (TEE);  Surgeon: Adrian Prows, MD;  Location: [place];  Service: Cardiovascular;  Laterality: N/A;  . TOE SURGERY     rt foot     There were no vitals filed for this visit.  Subjective Assessment - 05/27/19 1428    Subjective  Patient reports that she did some cleaning yesterday and is having a little more pain in the mid and upper back    Currently in Pain?  Yes    Pain Score  5     Pain Location  Back    Pain Orientation  Upper;Mid;Lower    Aggravating Factors   bending and cleaning  [place], [place], [address] Phone: [phone]   Fax:  [phone]  Physical Therapy Treatment  Patient Details  Name: Linda Mcgrath MRN: [id number] Date of Birth: 16-Jan-1933 Referring Provider (PT): [name]   Encounter Date: 05/27/2019  PT End of Session - 05/27/19 1431    Visit Number  13    Date for PT Re-Evaluation  06/14/19    PT Start Time  1350    PT Stop Time  1440    PT Time Calculation (min)  50 min    Activity Tolerance  Patient tolerated treatment well    Behavior During Therapy  [facility] for tasks assessed/performed       Past Medical History:  Diagnosis Date  . Allergic rhinitis   . Asthma   . Cataract    Bil/lens implant  . Colon polyp 1999   small polyp  . Diverticulosis   . Dysrhythmia   . Fibromyalgia   . Hyperlipidemia   . Hypothyroidism   . Internal hemorrhoids   . Leg cramps   . Lung nodule    stable LUL 9 mm  . Menopausal syndrome   . Osteoarthritis   . UTI (urinary tract infection)     Past Surgical History:  Procedure Laterality Date  . Abd U/S  11/1998   negative  . Abd U/S  01/2001   gallbladder polyps  . ABDOMINAL HYSTERECTOMY  1975   total-fibroids  . APPENDECTOMY  1975  . BREAST BIOPSY     benign  . BREAST EXCISIONAL BIOPSY Left 1957  . CARDIOVERSION N/A 03/07/2016   Procedure: CARDIOVERSION;  Surgeon: Adrian Prows, MD;  Location: [place];  Service: Cardiovascular;  Laterality: N/A;  . CATARACT EXTRACTION     bilateral  . CHOLECYSTECTOMY    . COLONOSCOPY  1998   Diverticulosis; polyp\  . COLONOSCOPY  09/2002   Diverticulosis, hem  . COLONOSCOPY  12/2007   diverticulosis, polyp  . DEXA  10/2001   osteopenia  . ELECTROPHYSIOLOGIC STUDY N/A 04/03/2016   Procedure: A-Flutter Ablation;  Surgeon: Will Meredith Leeds, MD;  Location: [facility];  Service: Cardiovascular;  Laterality: N/A;  . ESOPHAGOGASTRODUODENOSCOPY  2004  . [place]  scan  01/2001   Negative  . KNEE SURGERY     right knee / 11/2004  . LAMINECTOMY WITH POSTERIOR LATERAL ARTHRODESIS LEVEL 2 N/A 07/31/2017   Procedure: DECOMPRESSIVE LAMINECTOMY LUMABR FOUR-FIVE, LUMBAR FIVE-SACRAL ONE;  Surgeon: Eustace Moore, MD;  Location: [place];  Service: Neurosurgery;  Laterality: N/A;  . laser surgery for glaucoma Left Sep 21, 2014  . ROTATOR CUFF REPAIR     x 2 /right shoulder  . SKIN CANCER EXCISION     pre-melanoma / on face  . TEE WITHOUT CARDIOVERSION N/A 03/07/2016   Procedure: TRANSESOPHAGEAL ECHOCARDIOGRAM (TEE);  Surgeon: Adrian Prows, MD;  Location: [place];  Service: Cardiovascular;  Laterality: N/A;  . TOE SURGERY     rt foot     There were no vitals filed for this visit.  Subjective Assessment - 05/27/19 1428    Subjective  Patient reports that she did some cleaning yesterday and is having a little more pain in the mid and upper back    Currently in Pain?  Yes    Pain Score  5     Pain Location  Back    Pain Orientation  Upper;Mid;Lower    Aggravating Factors   bending and cleaning

## 2019-05-31 ENCOUNTER — Encounter: Payer: Self-pay | Admitting: Physical Therapy

## 2019-05-31 ENCOUNTER — Ambulatory Visit: Payer: Medicare Other | Admitting: Physical Therapy

## 2019-05-31 ENCOUNTER — Other Ambulatory Visit: Payer: Self-pay

## 2019-05-31 DIAGNOSIS — G8929 Other chronic pain: Secondary | ICD-10-CM | POA: Diagnosis not present

## 2019-05-31 DIAGNOSIS — M62830 Muscle spasm of back: Secondary | ICD-10-CM | POA: Diagnosis not present

## 2019-05-31 DIAGNOSIS — M5441 Lumbago with sciatica, right side: Secondary | ICD-10-CM | POA: Diagnosis not present

## 2019-05-31 DIAGNOSIS — M545 Low back pain: Secondary | ICD-10-CM | POA: Diagnosis not present

## 2019-05-31 DIAGNOSIS — R262 Difficulty in walking, not elsewhere classified: Secondary | ICD-10-CM | POA: Diagnosis not present

## 2019-05-31 NOTE — Therapy (Signed)
mechanics, Pain, Postural dysfunction, Increased muscle spasms, Decreased mobility, Decreased activity tolerance, Decreased endurance, Decreased range of motion, Decreased  strength, Impaired flexibility, Difficulty walking, Decreased balance  Visit Diagnosis: Acute right-sided low back pain with right-sided sciatica  Muscle spasm of back  Difficulty in walking, not elsewhere classified     Problem List Patient Active Problem List   Diagnosis Date Noted  . Diarrhea 07/03/2018  . Dyspepsia 07/03/2018  . S/P lumbar spinal fusion 07/31/2017  . Right low back pain 03/04/2017  . H/O atrial flutter 03/06/2016  . Obesity 11/06/2015  . Urge incontinence 01/24/2015  . Estrogen deficiency 10/25/2014  . Lichen planus 80/88/1103  . Encounter for Medicare annual wellness exam 06/17/2013  . Screening mammogram, encounter for 06/15/2012  . Prediabetes 06/03/2011  . HYPERTENSION, BENIGN ESSENTIAL 10/23/2010  . CONSTIPATION 10/23/2010  . HEMATOCHEZIA 10/23/2010  . OSTEOARTHRITIS, HANDS, BILATERAL 01/24/2010  . Hypothyroidism 08/24/2008  . Vitamin D deficiency 06/06/2008  . Hyperlipidemia 06/06/2008  . MENOPAUSAL SYNDROME 03/29/2008  . Osteopenia 03/29/2008  . ALLERGIC RHINITIS 01/25/2008  . IBS 01/25/2008  . OSTEOARTHRITIS 01/25/2008  . FIBROMYALGIA 01/25/2008    [name], PT 05/31/2019, 2:42 PM  [address] [place], [place], [address] Phone: [phone]   Fax:  [phone]  Name: Linda Mcgrath MRN: [id number] Date of Birth: 06/14/1933  [place] [address] [place], [place], [address] Phone: [phone]   Fax:  [phone]  Physical Therapy Treatment  Patient Details  Name: Linda Mcgrath MRN: [id number] Date of Birth: [birth year] Referring Provider (PT): [name]   Encounter Date: 05/31/2019  PT End of Session - 05/31/19 1438    Visit Number  14    Date for PT Re-Evaluation  06/14/19    PT Start Time  1345    PT Stop Time  1445    PT Time Calculation (min)  60 min    Activity Tolerance  Patient tolerated treatment well    Behavior During Therapy  [facility] for tasks assessed/performed       Past Medical History:  Diagnosis Date  . Allergic rhinitis   . Asthma   . Cataract    Bil/lens implant  . Colon polyp 1999   small polyp  . Diverticulosis   . Dysrhythmia   . Fibromyalgia   . Hyperlipidemia   . Hypothyroidism   . Internal hemorrhoids   . Leg cramps   . Lung nodule    stable LUL 9 mm  . Menopausal syndrome   . Osteoarthritis   . UTI (urinary tract infection)     Past Surgical History:  Procedure Laterality Date  . Abd U/S  11/1998   negative  . Abd U/S  01/2001   gallbladder polyps  . ABDOMINAL HYSTERECTOMY  1975   total-fibroids  . APPENDECTOMY  1975  . BREAST BIOPSY     benign  . BREAST EXCISIONAL BIOPSY Left 1957  . CARDIOVERSION N/A 03/07/2016   Procedure: CARDIOVERSION;  Surgeon: Adrian Prows, MD;  Location: [place];  Service: Cardiovascular;  Laterality: N/A;  . CATARACT EXTRACTION     bilateral  . CHOLECYSTECTOMY    . COLONOSCOPY  1998   Diverticulosis; polyp\  . COLONOSCOPY  09/2002   Diverticulosis, hem  . COLONOSCOPY  12/2007   diverticulosis, polyp  . DEXA  10/2001   osteopenia  . ELECTROPHYSIOLOGIC STUDY N/A 04/03/2016   Procedure: A-Flutter Ablation;  Surgeon: Will Meredith Leeds, MD;  Location: [place] [facility];  Service: Cardiovascular;  Laterality: N/A;  . ESOPHAGOGASTRODUODENOSCOPY  2004  . [place]  scan  01/2001   Negative  . KNEE SURGERY     right knee / 11/2004  . LAMINECTOMY WITH POSTERIOR LATERAL ARTHRODESIS LEVEL 2 N/A 07/31/2017   Procedure: DECOMPRESSIVE LAMINECTOMY LUMABR FOUR-FIVE, LUMBAR FIVE-SACRAL ONE;  Surgeon: Eustace Moore, MD;  Location: [place];  Service: Neurosurgery;  Laterality: N/A;  . laser surgery for glaucoma Left Sep 21, 2014  . ROTATOR CUFF REPAIR     x 2 /right shoulder  . SKIN CANCER EXCISION     pre-melanoma / on face  . TEE WITHOUT CARDIOVERSION N/A 03/07/2016   Procedure: TRANSESOPHAGEAL ECHOCARDIOGRAM (TEE);  Surgeon: Adrian Prows, MD;  Location: [place];  Service: Cardiovascular;  Laterality: N/A;  . TOE SURGERY     rt foot     There were no vitals filed for this visit.  Subjective Assessment - 05/31/19 1359    Subjective  I did a little work in the yard, I am a little sore    Currently in Pain?  Yes    Pain Score  4     Pain Location  Back    Pain Orientation  Lower;Mid;Right  [place], [place], [address] Phone: [phone]   Fax:  [phone]  Physical Therapy Treatment  Patient Details  Name: Linda Mcgrath MRN: [id number] Date of Birth: [birth year] Referring Provider (PT): [name]   Encounter Date: 05/31/2019  PT End of Session - 05/31/19 1438    Visit Number  14    Date for PT Re-Evaluation  06/14/19    PT Start Time  1345    PT Stop Time  1445    PT Time Calculation (min)  60 min    Activity Tolerance  Patient tolerated treatment well    Behavior During Therapy  [facility] for tasks assessed/performed       Past Medical History:  Diagnosis Date  . Allergic rhinitis   . Asthma   . Cataract    Bil/lens implant  . Colon polyp 1999   small polyp  . Diverticulosis   . Dysrhythmia   . Fibromyalgia   . Hyperlipidemia   . Hypothyroidism   . Internal hemorrhoids   . Leg cramps   . Lung nodule    stable LUL 9 mm  . Menopausal syndrome   . Osteoarthritis   . UTI (urinary tract infection)     Past Surgical History:  Procedure Laterality Date  . Abd U/S  11/1998   negative  . Abd U/S  01/2001   gallbladder polyps  . ABDOMINAL HYSTERECTOMY  1975   total-fibroids  . APPENDECTOMY  1975  . BREAST BIOPSY     benign  . BREAST EXCISIONAL BIOPSY Left 1957  . CARDIOVERSION N/A 03/07/2016   Procedure: CARDIOVERSION;  Surgeon: Adrian Prows, MD;  Location: [place];  Service: Cardiovascular;  Laterality: N/A;  . CATARACT EXTRACTION     bilateral  . CHOLECYSTECTOMY    . COLONOSCOPY  1998   Diverticulosis; polyp\  . COLONOSCOPY  09/2002   Diverticulosis, hem  . COLONOSCOPY  12/2007   diverticulosis, polyp  . DEXA  10/2001   osteopenia  . ELECTROPHYSIOLOGIC STUDY N/A 04/03/2016   Procedure: A-Flutter Ablation;  Surgeon: Will Meredith Leeds, MD;  Location: [place] [facility];  Service: Cardiovascular;  Laterality: N/A;  . ESOPHAGOGASTRODUODENOSCOPY  2004  . [place]  scan  01/2001   Negative  . KNEE SURGERY     right knee / 11/2004  . LAMINECTOMY WITH POSTERIOR LATERAL ARTHRODESIS LEVEL 2 N/A 07/31/2017   Procedure: DECOMPRESSIVE LAMINECTOMY LUMABR FOUR-FIVE, LUMBAR FIVE-SACRAL ONE;  Surgeon: Eustace Moore, MD;  Location: [place] [facility];  Service: Neurosurgery;  Laterality: N/A;  . laser surgery for glaucoma Left Sep 21, 2014  . ROTATOR CUFF REPAIR     x 2 /right shoulder  . SKIN CANCER EXCISION     pre-melanoma / on face  . TEE WITHOUT CARDIOVERSION N/A 03/07/2016   Procedure: TRANSESOPHAGEAL ECHOCARDIOGRAM (TEE);  Surgeon: Adrian Prows, MD;  Location: [place];  Service: Cardiovascular;  Laterality: N/A;  . TOE SURGERY     rt foot     There were no vitals filed for this visit.  Subjective Assessment - 05/31/19 1359    Subjective  I did a little work in the yard, I am a little sore    Currently in Pain?  Yes    Pain Score  4     Pain Location  Back    Pain Orientation  Lower;Mid;Right

## 2019-06-03 ENCOUNTER — Ambulatory Visit: Payer: Medicare Other | Admitting: Physical Therapy

## 2019-06-03 ENCOUNTER — Other Ambulatory Visit: Payer: Self-pay

## 2019-06-03 ENCOUNTER — Encounter: Payer: Self-pay | Admitting: Physical Therapy

## 2019-06-03 DIAGNOSIS — M62830 Muscle spasm of back: Secondary | ICD-10-CM | POA: Diagnosis not present

## 2019-06-03 DIAGNOSIS — M545 Low back pain: Secondary | ICD-10-CM | POA: Diagnosis not present

## 2019-06-03 DIAGNOSIS — M5441 Lumbago with sciatica, right side: Secondary | ICD-10-CM

## 2019-06-03 DIAGNOSIS — R262 Difficulty in walking, not elsewhere classified: Secondary | ICD-10-CM | POA: Diagnosis not present

## 2019-06-03 DIAGNOSIS — G8929 Other chronic pain: Secondary | ICD-10-CM | POA: Diagnosis not present

## 2019-06-03 NOTE — Therapy (Signed)
[place], [place], [address] Phone: [phone]   Fax:  [phone]  Physical Therapy Treatment  Patient Details  Name: Linda Mcgrath MRN: [id number] Date of Birth: 13-Oct-1932 Referring Provider (PT): [name]   Encounter Date: 06/03/2019  PT End of Session - 06/03/19 1434    Visit Number  15    Date for PT Re-Evaluation  06/14/19    PT Start Time  1355    PT Stop Time  1440    PT Time Calculation (min)  45 min    Activity Tolerance  Patient tolerated treatment well    Behavior During Therapy  [facility] for tasks assessed/performed       Past Medical History:  Diagnosis Date  . Allergic rhinitis   . Asthma   . Cataract    Bil/lens implant  . Colon polyp 1999   small polyp  . Diverticulosis   . Dysrhythmia   . Fibromyalgia   . Hyperlipidemia   . Hypothyroidism   . Internal hemorrhoids   . Leg cramps   . Lung nodule    stable LUL 9 mm  . Menopausal syndrome   . Osteoarthritis   . UTI (urinary tract infection)     Past Surgical History:  Procedure Laterality Date  . Abd U/S  11/1998   negative  . Abd U/S  01/2001   gallbladder polyps  . ABDOMINAL HYSTERECTOMY  1975   total-fibroids  . APPENDECTOMY  1975  . BREAST BIOPSY     benign  . BREAST EXCISIONAL BIOPSY Left 1957  . CARDIOVERSION N/A 03/07/2016   Procedure: CARDIOVERSION;  Surgeon: Adrian Prows, MD;  Location: [place];  Service: Cardiovascular;  Laterality: N/A;  . CATARACT EXTRACTION     bilateral  . CHOLECYSTECTOMY    . COLONOSCOPY  1998   Diverticulosis; polyp\  . COLONOSCOPY  09/2002   Diverticulosis, hem  . COLONOSCOPY  12/2007   diverticulosis, polyp  . DEXA  10/2001   osteopenia  . ELECTROPHYSIOLOGIC STUDY N/A 04/03/2016   Procedure: A-Flutter Ablation;  Surgeon: Will Meredith Leeds, MD;  Location: [place] [facility];  Service: Cardiovascular;  Laterality: N/A;  . ESOPHAGOGASTRODUODENOSCOPY  2004  . [place]  scan  01/2001   Negative  . KNEE SURGERY     right knee / 11/2004  . LAMINECTOMY WITH POSTERIOR LATERAL ARTHRODESIS LEVEL 2 N/A 07/31/2017   Procedure: DECOMPRESSIVE LAMINECTOMY LUMABR FOUR-FIVE, LUMBAR FIVE-SACRAL ONE;  Surgeon: Eustace Moore, MD;  Location: [facility];  Service: Neurosurgery;  Laterality: N/A;  . laser surgery for glaucoma Left Sep 21, 2014  . ROTATOR CUFF REPAIR     x 2 /right shoulder  . SKIN CANCER EXCISION     pre-melanoma / on face  . TEE WITHOUT CARDIOVERSION N/A 03/07/2016   Procedure: TRANSESOPHAGEAL ECHOCARDIOGRAM (TEE);  Surgeon: Adrian Prows, MD;  Location: [place];  Service: Cardiovascular;  Laterality: N/A;  . TOE SURGERY     rt foot     There were no vitals filed for this visit.  Subjective Assessment - 06/03/19 1359    Subjective  Patient reports that she had a great night of sleep for the first time in a long time, reports that she had a good day with less pain yesterday    Currently in Pain?  Yes    Pain Score  4     Pain Location  Back    Pain Orientation  Lower;Right  [place], [place], [address] Phone: [phone]   Fax:  [phone]  Physical Therapy Treatment  Patient Details  Name: Linda Mcgrath MRN: [id number] Date of Birth: 13-Oct-1932 Referring Provider (PT): [name]   Encounter Date: 06/03/2019  PT End of Session - 06/03/19 1434    Visit Number  15    Date for PT Re-Evaluation  06/14/19    PT Start Time  1355    PT Stop Time  1440    PT Time Calculation (min)  45 min    Activity Tolerance  Patient tolerated treatment well    Behavior During Therapy  [facility] for tasks assessed/performed       Past Medical History:  Diagnosis Date  . Allergic rhinitis   . Asthma   . Cataract    Bil/lens implant  . Colon polyp 1999   small polyp  . Diverticulosis   . Dysrhythmia   . Fibromyalgia   . Hyperlipidemia   . Hypothyroidism   . Internal hemorrhoids   . Leg cramps   . Lung nodule    stable LUL 9 mm  . Menopausal syndrome   . Osteoarthritis   . UTI (urinary tract infection)     Past Surgical History:  Procedure Laterality Date  . Abd U/S  11/1998   negative  . Abd U/S  01/2001   gallbladder polyps  . ABDOMINAL HYSTERECTOMY  1975   total-fibroids  . APPENDECTOMY  1975  . BREAST BIOPSY     benign  . BREAST EXCISIONAL BIOPSY Left 1957  . CARDIOVERSION N/A 03/07/2016   Procedure: CARDIOVERSION;  Surgeon: Adrian Prows, MD;  Location: [place];  Service: Cardiovascular;  Laterality: N/A;  . CATARACT EXTRACTION     bilateral  . CHOLECYSTECTOMY    . COLONOSCOPY  1998   Diverticulosis; polyp\  . COLONOSCOPY  09/2002   Diverticulosis, hem  . COLONOSCOPY  12/2007   diverticulosis, polyp  . DEXA  10/2001   osteopenia  . ELECTROPHYSIOLOGIC STUDY N/A 04/03/2016   Procedure: A-Flutter Ablation;  Surgeon: Will Meredith Leeds, MD;  Location: [place];  Service: Cardiovascular;  Laterality: N/A;  . ESOPHAGOGASTRODUODENOSCOPY  2004  . Hida  scan  01/2001   Negative  . KNEE SURGERY     right knee / 11/2004  . LAMINECTOMY WITH POSTERIOR LATERAL ARTHRODESIS LEVEL 2 N/A 07/31/2017   Procedure: DECOMPRESSIVE LAMINECTOMY LUMABR FOUR-FIVE, LUMBAR FIVE-SACRAL ONE;  Surgeon: Eustace Moore, MD;  Location: [place];  Service: Neurosurgery;  Laterality: N/A;  . laser surgery for glaucoma Left Sep 21, 2014  . ROTATOR CUFF REPAIR     x 2 /right shoulder  . SKIN CANCER EXCISION     pre-melanoma / on face  . TEE WITHOUT CARDIOVERSION N/A 03/07/2016   Procedure: TRANSESOPHAGEAL ECHOCARDIOGRAM (TEE);  Surgeon: Adrian Prows, MD;  Location: [place];  Service: Cardiovascular;  Laterality: N/A;  . TOE SURGERY     rt foot     There were no vitals filed for this visit.  Subjective Assessment - 06/03/19 1359    Subjective  Patient reports that she had a great night of sleep for the first time in a long time, reports that she had a good day with less pain yesterday    Currently in Pain?  Yes    Pain Score  4     Pain Location  Back    Pain Orientation  Lower;Right  deficiency 06/06/2008  . Hyperlipidemia 06/06/2008  . MENOPAUSAL SYNDROME 03/29/2008  . Osteopenia 03/29/2008  . ALLERGIC RHINITIS 01/25/2008  . IBS 01/25/2008  . OSTEOARTHRITIS 01/25/2008  . FIBROMYALGIA 01/25/2008    [name], PT 06/03/2019, 2:36 PM  [address] [place], [place], [address] Phone: [phone]   Fax:  [phone]  Name: Linda Mcgrath MRN: [id number] Date of Birth: 19-May-1933

## 2019-06-07 ENCOUNTER — Other Ambulatory Visit: Payer: Self-pay

## 2019-06-07 ENCOUNTER — Ambulatory Visit: Payer: Medicare Other | Admitting: Physical Therapy

## 2019-06-07 ENCOUNTER — Encounter: Payer: Self-pay | Admitting: Physical Therapy

## 2019-06-07 DIAGNOSIS — M62830 Muscle spasm of back: Secondary | ICD-10-CM | POA: Diagnosis not present

## 2019-06-07 DIAGNOSIS — R262 Difficulty in walking, not elsewhere classified: Secondary | ICD-10-CM

## 2019-06-07 DIAGNOSIS — M5441 Lumbago with sciatica, right side: Secondary | ICD-10-CM | POA: Diagnosis not present

## 2019-06-07 DIAGNOSIS — M545 Low back pain: Secondary | ICD-10-CM | POA: Diagnosis not present

## 2019-06-07 DIAGNOSIS — G8929 Other chronic pain: Secondary | ICD-10-CM | POA: Diagnosis not present

## 2019-06-07 NOTE — Therapy (Signed)
Fax:  [phone]  Name: Linda Mcgrath MRN: [id number] Date of Birth: Oct 24, 1932  [facility] [place], [place], [address] Phone: [phone]   Fax:  [phone]  Physical Therapy Treatment  Patient Details  Name: Linda Mcgrath MRN: [id number] Date of Birth: May 27, 1933 Referring Provider (PT): [name]   Encounter Date: 06/07/2019  PT End of Session - 06/07/19 1423    Visit Number  16    Date for PT Re-Evaluation  06/14/19    PT Start Time  1350    PT Stop Time  1438    PT Time Calculation (min)  48 min    Activity Tolerance  Patient tolerated treatment well    Behavior During Therapy  [facility] for tasks assessed/performed       Past Medical History:  Diagnosis Date  . Allergic rhinitis   . Asthma   . Cataract    Bil/lens implant  . Colon polyp 1999   small polyp  . Diverticulosis   . Dysrhythmia   . Fibromyalgia   . Hyperlipidemia   . Hypothyroidism   . Internal hemorrhoids   . Leg cramps   . Lung nodule    stable LUL 9 mm  . Menopausal syndrome   . Osteoarthritis   . UTI (urinary tract infection)     Past Surgical History:  Procedure Laterality Date  . Abd U/S  11/1998   negative  . Abd U/S  01/2001   gallbladder polyps  . ABDOMINAL HYSTERECTOMY  1975   total-fibroids  . APPENDECTOMY  1975  . BREAST BIOPSY     benign  . BREAST EXCISIONAL BIOPSY Left 1957  . CARDIOVERSION N/A 03/07/2016   Procedure: CARDIOVERSION;  Surgeon: Adrian Prows, MD;  Location: [place];  Service: Cardiovascular;  Laterality: N/A;  . CATARACT EXTRACTION     bilateral  . CHOLECYSTECTOMY    . COLONOSCOPY  1998   Diverticulosis; polyp\  . COLONOSCOPY  09/2002   Diverticulosis, hem  . COLONOSCOPY  12/2007   diverticulosis, polyp  . DEXA  10/2001   osteopenia  . ELECTROPHYSIOLOGIC STUDY N/A 04/03/2016   Procedure: A-Flutter Ablation;  Surgeon: Will Meredith Leeds, MD;  Location: [place] [facility];  Service: Cardiovascular;  Laterality: N/A;  . ESOPHAGOGASTRODUODENOSCOPY  2004  . [place]  scan  01/2001   Negative  . KNEE SURGERY     right knee / 11/2004  . LAMINECTOMY WITH POSTERIOR LATERAL ARTHRODESIS LEVEL 2 N/A 07/31/2017   Procedure: DECOMPRESSIVE LAMINECTOMY LUMABR FOUR-FIVE, LUMBAR FIVE-SACRAL ONE;  Surgeon: Eustace Moore, MD;  Location: [place];  Service: Neurosurgery;  Laterality: N/A;  . laser surgery for glaucoma Left Sep 21, 2014  . ROTATOR CUFF REPAIR     x 2 /right shoulder  . SKIN CANCER EXCISION     pre-melanoma / on face  . TEE WITHOUT CARDIOVERSION N/A 03/07/2016   Procedure: TRANSESOPHAGEAL ECHOCARDIOGRAM (TEE);  Surgeon: Adrian Prows, MD;  Location: [place];  Service: Cardiovascular;  Laterality: N/A;  . TOE SURGERY     rt foot     There were no vitals filed for this visit.  Subjective Assessment - 06/07/19 1356    Subjective  Patient reports that with all the rain she is a little sore and stiff today    Currently in Pain?  Yes    Pain Score  4     Pain Location  Back    Pain Descriptors / Indicators  Sore;Spasm    Aggravating Factors   rainy weather  [facility] [place], [place], [address] Phone: [phone]   Fax:  [phone]  Physical Therapy Treatment  Patient Details  Name: Linda Mcgrath MRN: [id number] Date of Birth: May 27, 1933 Referring Provider (PT): [name]   Encounter Date: 06/07/2019  PT End of Session - 06/07/19 1423    Visit Number  16    Date for PT Re-Evaluation  06/14/19    PT Start Time  1350    PT Stop Time  1438    PT Time Calculation (min)  48 min    Activity Tolerance  Patient tolerated treatment well    Behavior During Therapy  [facility] for tasks assessed/performed       Past Medical History:  Diagnosis Date  . Allergic rhinitis   . Asthma   . Cataract    Bil/lens implant  . Colon polyp 1999   small polyp  . Diverticulosis   . Dysrhythmia   . Fibromyalgia   . Hyperlipidemia   . Hypothyroidism   . Internal hemorrhoids   . Leg cramps   . Lung nodule    stable LUL 9 mm  . Menopausal syndrome   . Osteoarthritis   . UTI (urinary tract infection)     Past Surgical History:  Procedure Laterality Date  . Abd U/S  11/1998   negative  . Abd U/S  01/2001   gallbladder polyps  . ABDOMINAL HYSTERECTOMY  1975   total-fibroids  . APPENDECTOMY  1975  . BREAST BIOPSY     benign  . BREAST EXCISIONAL BIOPSY Left 1957  . CARDIOVERSION N/A 03/07/2016   Procedure: CARDIOVERSION;  Surgeon: Adrian Prows, MD;  Location: [place];  Service: Cardiovascular;  Laterality: N/A;  . CATARACT EXTRACTION     bilateral  . CHOLECYSTECTOMY    . COLONOSCOPY  1998   Diverticulosis; polyp\  . COLONOSCOPY  09/2002   Diverticulosis, hem  . COLONOSCOPY  12/2007   diverticulosis, polyp  . DEXA  10/2001   osteopenia  . ELECTROPHYSIOLOGIC STUDY N/A 04/03/2016   Procedure: A-Flutter Ablation;  Surgeon: Will Meredith Leeds, MD;  Location: [place] [facility];  Service: Cardiovascular;  Laterality: N/A;  . ESOPHAGOGASTRODUODENOSCOPY  2004  . [place]  scan  01/2001   Negative  . KNEE SURGERY     right knee / 11/2004  . LAMINECTOMY WITH POSTERIOR LATERAL ARTHRODESIS LEVEL 2 N/A 07/31/2017   Procedure: DECOMPRESSIVE LAMINECTOMY LUMABR FOUR-FIVE, LUMBAR FIVE-SACRAL ONE;  Surgeon: Eustace Moore, MD;  Location: [place];  Service: Neurosurgery;  Laterality: N/A;  . laser surgery for glaucoma Left Sep 21, 2014  . ROTATOR CUFF REPAIR     x 2 /right shoulder  . SKIN CANCER EXCISION     pre-melanoma / on face  . TEE WITHOUT CARDIOVERSION N/A 03/07/2016   Procedure: TRANSESOPHAGEAL ECHOCARDIOGRAM (TEE);  Surgeon: Adrian Prows, MD;  Location: [place];  Service: Cardiovascular;  Laterality: N/A;  . TOE SURGERY     rt foot     There were no vitals filed for this visit.  Subjective Assessment - 06/07/19 1356    Subjective  Patient reports that with all the rain she is a little sore and stiff today    Currently in Pain?  Yes    Pain Score  4     Pain Location  Back    Pain Descriptors / Indicators  Sore;Spasm    Aggravating Factors   rainy weather

## 2019-06-10 ENCOUNTER — Ambulatory Visit: Payer: Medicare Other | Admitting: Physical Therapy

## 2019-06-10 ENCOUNTER — Other Ambulatory Visit: Payer: Self-pay

## 2019-06-10 ENCOUNTER — Encounter: Payer: Self-pay | Admitting: Physical Therapy

## 2019-06-10 DIAGNOSIS — R262 Difficulty in walking, not elsewhere classified: Secondary | ICD-10-CM | POA: Diagnosis not present

## 2019-06-10 DIAGNOSIS — M5441 Lumbago with sciatica, right side: Secondary | ICD-10-CM | POA: Diagnosis not present

## 2019-06-10 DIAGNOSIS — M62830 Muscle spasm of back: Secondary | ICD-10-CM

## 2019-06-10 DIAGNOSIS — G8929 Other chronic pain: Secondary | ICD-10-CM | POA: Diagnosis not present

## 2019-06-10 DIAGNOSIS — M545 Low back pain: Secondary | ICD-10-CM | POA: Diagnosis not present

## 2019-06-10 NOTE — Therapy (Signed)
[facility] [address] [place], [place], [address] Phone: [phone]   Fax:  [phone]  Physical Therapy Treatment  Patient Details  Name: Linda Mcgrath MRN: [id number] Date of Birth: [birth year] Referring Provider (PT): [name]   Encounter Date: 06/10/2019  PT End of Session - 06/10/19 1437    Visit Number  17    Date for PT Re-Evaluation  06/14/19    PT Start Time  1346    PT Stop Time  1437    PT Time Calculation (min)  51 min    Activity Tolerance  Patient tolerated treatment well    Behavior During Therapy  [facility] for tasks assessed/performed       Past Medical History:  Diagnosis Date  . Allergic rhinitis   . Asthma   . Cataract    Bil/lens implant  . Colon polyp 1999   small polyp  . Diverticulosis   . Dysrhythmia   . Fibromyalgia   . Hyperlipidemia   . Hypothyroidism   . Internal hemorrhoids   . Leg cramps   . Lung nodule    stable LUL 9 mm  . Menopausal syndrome   . Osteoarthritis   . UTI (urinary tract infection)     Past Surgical History:  Procedure Laterality Date  . Abd U/S  11/1998   negative  . Abd U/S  01/2001   gallbladder polyps  . ABDOMINAL HYSTERECTOMY  1975   total-fibroids  . APPENDECTOMY  1975  . BREAST BIOPSY     benign  . BREAST EXCISIONAL BIOPSY Left 1957  . CARDIOVERSION N/A 03/07/2016   Procedure: CARDIOVERSION;  Surgeon: Yates Decamp, MD;  Location: [facility];  Service: Cardiovascular;  Laterality: N/A;  . CATARACT EXTRACTION     bilateral  . CHOLECYSTECTOMY    . COLONOSCOPY  1998   Diverticulosis; polyp\  . COLONOSCOPY  09/2002   Diverticulosis, hem  . COLONOSCOPY  12/2007   diverticulosis, polyp  . DEXA  10/2001   osteopenia  . ELECTROPHYSIOLOGIC STUDY N/A 04/03/2016   Procedure: A-Flutter Ablation;  Surgeon: Will [name], MD;  Location: [facility];  Service: Cardiovascular;  Laterality: N/A;  . ESOPHAGOGASTRODUODENOSCOPY  2004  . [place]  scan  01/2001   Negative  . KNEE SURGERY     right knee / 11/2004  . LAMINECTOMY WITH POSTERIOR LATERAL ARTHRODESIS LEVEL 2 N/A 07/31/2017   Procedure: DECOMPRESSIVE LAMINECTOMY LUMABR FOUR-FIVE, LUMBAR FIVE-SACRAL ONE;  Surgeon: Tia Alert, MD;  Location: [facility] OR;  Service: Neurosurgery;  Laterality: N/A;  . laser surgery for glaucoma Left Sep 21, 2014  . ROTATOR CUFF REPAIR     x 2 /right shoulder  . SKIN CANCER EXCISION     pre-melanoma / on face  . TEE WITHOUT CARDIOVERSION N/A 03/07/2016   Procedure: TRANSESOPHAGEAL ECHOCARDIOGRAM (TEE);  Surgeon: Yates Decamp, MD;  Location: [facility] ENDOSCOPY;  Service: Cardiovascular;  Laterality: N/A;  . TOE SURGERY     rt foot     There were no vitals filed for this visit.  Subjective Assessment - 06/10/19 1422    Subjective  Patient reports that she has had some good nights of rest but woke up this AM iwth pain in the long bones and her back, sh is thinking it could be a side effect of her Fosomax    Currently in Pain?  Yes    Pain Score  5     Pain Location  Back  Pain Orientation  Right;Lower                       [facility] Adult PT Treatment/Exercise - 06/10/19 0001      Ambulation/Gait   Gait Comments  walk around the building with SPC, then rest and then around the front parking island      Lumbar Exercises: Stretches   Passive Hamstring Stretch  Right;2 reps;20 seconds      Lumbar Exercises: Standing   Row  Both;20 reps;Theraband    Theraband Level (Row)  Level 3 (Green)    Shoulder Extension  Both;20 reps;Theraband    Theraband Level (Shoulder Extension)  Level 2 (Red)    Other Standing Lumbar Exercises  side stepping      Lumbar Exercises: Seated   Other Seated Lumbar Exercises  isometric abdominal squeeze, ball b/n knees squeeze    Other Seated Lumbar Exercises  ball b/n knees squeeze, 2.5# hip flexion sitting      Moist Heat Therapy   Number Minutes Moist Heat  15 Minutes    Moist Heat Location  Lumbar Spine       Electrical Stimulation   Electrical Stimulation Location  right low back into the right hip    Electrical Stimulation Action  IFC    Electrical Stimulation Parameters  sitting    Electrical Stimulation Goals  Pain               PT Short Term Goals - 04/26/19 1427      PT SHORT TERM GOAL #1   Title  independent with HEP    Status  Achieved        PT Long Term Goals - 06/10/19 1439      PT LONG TERM GOAL #1   Title  decrease pain 50%    Status  Partially Met      PT LONG TERM GOAL #2   Title  Increase lumbar AROM 25% in extension, side-bending, and rotation.     Status  Achieved            Plan - 06/10/19 1437    Clinical Impression Statement  Patient again with some soreness with what she thinks may be Fosomax side effects.  She did well with the walking, better posture and able to go longer with rest breaks    PT Next Visit Plan  continue to work on strength and function    Consulted and Agree with Plan of Care  Patient       Patient will benefit from skilled therapeutic intervention in order to improve the following deficits and impairments:  Abnormal gait, Improper body mechanics, Pain, Postural dysfunction, Increased muscle spasms, Decreased mobility, Decreased activity tolerance, Decreased endurance, Decreased range of motion, Decreased strength, Impaired flexibility, Difficulty walking, Decreased balance  Visit Diagnosis: Acute right-sided low back pain with right-sided sciatica  Muscle spasm of back  Difficulty in walking, not elsewhere classified     Problem List Patient Active Problem List   Diagnosis Date Noted  . Diarrhea 07/03/2018  . Dyspepsia 07/03/2018  . S/P lumbar spinal fusion 07/31/2017  . Right low back pain 03/04/2017  . H/O atrial flutter 03/06/2016  . Obesity 11/06/2015  . Urge incontinence 01/24/2015  . Estrogen deficiency 10/25/2014  . Lichen planus 12/20/2013  . Encounter for Medicare annual wellness exam  06/17/2013  . Screening mammogram, encounter for 06/15/2012  . Prediabetes 06/03/2011  . HYPERTENSION, BENIGN ESSENTIAL 10/23/2010  . CONSTIPATION 10/23/2010  .  HEMATOCHEZIA 10/23/2010  . OSTEOARTHRITIS, HANDS, BILATERAL 01/24/2010  . Hypothyroidism 08/24/2008  . Vitamin D deficiency 06/06/2008  . Hyperlipidemia 06/06/2008  . MENOPAUSAL SYNDROME 03/29/2008  . Osteopenia 03/29/2008  . ALLERGIC RHINITIS 01/25/2008  . IBS 01/25/2008  . OSTEOARTHRITIS 01/25/2008  . FIBROMYALGIA 01/25/2008    Jearld Lesch., PT 06/10/2019, 2:39 PM  [facility] [address] [place], [place], [address] Phone: [phone]   Fax:  [phone]  Name: Linda Mcgrath MRN: [id number] Date of Birth: 27-Sep-1932

## 2019-06-14 ENCOUNTER — Encounter: Payer: Self-pay | Admitting: Physical Therapy

## 2019-06-14 ENCOUNTER — Ambulatory Visit: Payer: Medicare Other | Admitting: Physical Therapy

## 2019-06-14 ENCOUNTER — Other Ambulatory Visit: Payer: Self-pay

## 2019-06-14 DIAGNOSIS — M62830 Muscle spasm of back: Secondary | ICD-10-CM

## 2019-06-14 DIAGNOSIS — M5441 Lumbago with sciatica, right side: Secondary | ICD-10-CM | POA: Diagnosis not present

## 2019-06-14 DIAGNOSIS — R262 Difficulty in walking, not elsewhere classified: Secondary | ICD-10-CM | POA: Diagnosis not present

## 2019-06-14 DIAGNOSIS — M545 Low back pain: Secondary | ICD-10-CM | POA: Diagnosis not present

## 2019-06-14 DIAGNOSIS — G8929 Other chronic pain: Secondary | ICD-10-CM | POA: Diagnosis not present

## 2019-06-14 NOTE — Therapy (Signed)
[facility] [place], [place], [address] Phone: [phone]   Fax:  [phone]  Physical Therapy Treatment  Patient Details  Name: Linda Mcgrath MRN: [id number] Date of Birth: [birth year] Referring Provider (PT): [name]   Encounter Date: 06/14/2019  PT End of Session - 06/14/19 1444    Visit Number  18    Date for PT Re-Evaluation  07/16/19    PT Start Time  1350    PT Stop Time  1440    PT Time Calculation (min)  50 min    Activity Tolerance  Patient tolerated treatment well    Behavior During Therapy  [facility] for tasks assessed/performed       Past Medical History:  Diagnosis Date  . Allergic rhinitis   . Asthma   . Cataract    Bil/lens implant  . Colon polyp 1999   small polyp  . Diverticulosis   . Dysrhythmia   . Fibromyalgia   . Hyperlipidemia   . Hypothyroidism   . Internal hemorrhoids   . Leg cramps   . Lung nodule    stable LUL 9 mm  . Menopausal syndrome   . Osteoarthritis   . UTI (urinary tract infection)     Past Surgical History:  Procedure Laterality Date  . Abd U/S  11/1998   negative  . Abd U/S  01/2001   gallbladder polyps  . ABDOMINAL HYSTERECTOMY  1975   total-fibroids  . APPENDECTOMY  1975  . BREAST BIOPSY     benign  . BREAST EXCISIONAL BIOPSY Left 1957  . CARDIOVERSION N/A 03/07/2016   Procedure: CARDIOVERSION;  Surgeon: Adrian Prows, MD;  Location: [place];  Service: Cardiovascular;  Laterality: N/A;  . CATARACT EXTRACTION     bilateral  . CHOLECYSTECTOMY    . COLONOSCOPY  1998   Diverticulosis; polyp_0  . COLONOSCOPY  09/2002   Diverticulosis, hem  . COLONOSCOPY  12/2007   diverticulosis, polyp  . DEXA  10/2001   osteopenia  . ELECTROPHYSIOLOGIC STUDY N/A 04/03/2016   Procedure: A-Flutter Ablation;  Surgeon: Will Meredith Leeds, MD;  Location: [place] [facility];  Service: Cardiovascular;  Laterality: N/A;  . ESOPHAGOGASTRODUODENOSCOPY  2004  . Hida  scan  01/2001   Negative  . KNEE SURGERY     right knee / 11/2004  . LAMINECTOMY WITH POSTERIOR LATERAL ARTHRODESIS LEVEL 2 N/A 07/31/2017   Procedure: DECOMPRESSIVE LAMINECTOMY LUMABR FOUR-FIVE, LUMBAR FIVE-SACRAL ONE;  Surgeon: Eustace Moore, MD;  Location: [place];  Service: Neurosurgery;  Laterality: N/A;  . laser surgery for glaucoma Left Sep 21, 2014  . ROTATOR CUFF REPAIR     x 2 /right shoulder  . SKIN CANCER EXCISION     pre-melanoma / on face  . TEE WITHOUT CARDIOVERSION N/A 03/07/2016   Procedure: TRANSESOPHAGEAL ECHOCARDIOGRAM (TEE);  Surgeon: Adrian Prows, MD;  Location: [place];  Service: Cardiovascular;  Laterality: N/A;  . TOE SURGERY     rt foot     There were no vitals filed for this visit.  Subjective Assessment - 06/14/19 1437    Subjective  Patient reports that she did some baking and was able to do mostly standing but did sit down to do some of it.    Currently in Pain?  Yes    Pain Score  3     Pain Location  Back    Pain Orientation  Right;Lower    Pain Descriptors / Indicators  Sore;Tightness;Spasm    Aggravating Factors   standing         [facility] PT Assessment - 06/14/19 0001      Assessment   Medical Diagnosis  LBP into the right hip    Referring Provider (PT)  [name]                   [facility] Adult PT Treatment/Exercise - 06/14/19 0001      Ambulation/Gait   Gait Comments  walk around the building, no rest, good pace and able to keep a conversation, mild short of breath at the end      Lumbar Exercises: Aerobic   Nustep  level 5 x 6 minutes      Lumbar Exercises: Machines for Strengthening   Other Lumbar Machine Exercise  seated row 10#, lats 10# 2x10 each      Lumbar Exercises: Standing   Other Standing Lumbar Exercises  standing hip abduction and extension 2.5# flexion     Other Standing Lumbar Exercises  side stepping      Lumbar Exercises: Seated   Long Arc Quad on Chair  Both;2 sets;10 reps    LAQ on Chair Weights (lbs)  3#     Hip Flexion on Ball  Both;20 reps    Hip Flexion on Ball Limitations  not on ball on mat with 2.5#    Other Seated Lumbar Exercises  isometric abdominal squeeze, ball b/n knees squeeze    Other Seated Lumbar Exercises  ball b/n knees squeeze, 2.5# hip flexion sitting      Manual Therapy   Manual Therapy  Soft tissue mobilization;Passive ROM    Soft tissue mobilization  right low back parapsinals up to the rhomboids and down to the buttocks               PT Short Term Goals - 04/26/19 1427      PT SHORT TERM GOAL #1   Title  independent with HEP    Status  Achieved        PT Long Term Goals - 06/14/19 1450      PT LONG TERM GOAL #1   Title  decrease pain 50%    Status  Partially Met      PT LONG TERM GOAL #2   Title  Increase lumbar AROM 25% in extension, side-bending, and rotation.     Status  Achieved      PT LONG TERM GOAL #3   Title  Understand proper posture and body mechanics for housework and yardwork tasks.     Status  Achieved      PT LONG TERM GOAL #4   Title  tolerate shopping for groceries without an increase in pain    Status  Partially Met      PT LONG TERM GOAL #5   Title  decrease TUG time to 17 seconds    Status  Achieved            Plan - 06/14/19 1445    Clinical Impression Statement  Patient able to do some cooking, she is able to walk more and more, she seems to be getting stronger and getting closer back to a functional PLOF.  She does continue to have pain in the right low back with standing and walking    PT Frequency  2x / week    PT Duration  4 weeks    PT Treatment/Interventions  ADLs/Self Care Home Management;Cryotherapy;Electrical Stimulation;Iontophoresis 4mg /ml Dexamethasone;Moist Heat;Ultrasound;Therapeutic activities;Functional mobility  training;Gait training;Therapeutic exercise;Balance training;Neuromuscular re-education;Patient/family education;Manual techniques;Dry needling    PT Next Visit Plan  work on return to  PLOF of walking, some yardwork and cooking    Consulted and Agree with Plan of Care  Patient       Patient will benefit from skilled therapeutic intervention in order to improve the following deficits and impairments:  Abnormal gait, Improper body mechanics, Pain, Postural dysfunction, Increased muscle spasms, Decreased mobility, Decreased activity tolerance, Decreased endurance, Decreased range of motion, Decreased strength, Impaired flexibility, Difficulty walking, Decreased balance  Visit Diagnosis: Acute right-sided low back pain with right-sided sciatica - Plan: PT plan of care cert/re-cert  Muscle spasm of back - Plan: PT plan of care cert/re-cert  Difficulty in walking, not elsewhere classified - Plan: PT plan of care cert/re-cert     Problem List Patient Active Problem List   Diagnosis Date Noted  . Diarrhea 07/03/2018  . Dyspepsia 07/03/2018  . S/P lumbar spinal fusion 07/31/2017  . Right low back pain 03/04/2017  . H/O atrial flutter 03/06/2016  . Obesity 11/06/2015  . Urge incontinence 01/24/2015  . Estrogen deficiency 10/25/2014  . Lichen planus 12/20/2013  . Encounter for Medicare annual wellness exam 06/17/2013  . Screening mammogram, encounter for 06/15/2012  . Prediabetes 06/03/2011  . HYPERTENSION, BENIGN ESSENTIAL 10/23/2010  . CONSTIPATION 10/23/2010  . HEMATOCHEZIA 10/23/2010  . OSTEOARTHRITIS, HANDS, BILATERAL 01/24/2010  . Hypothyroidism 08/24/2008  . Vitamin D deficiency 06/06/2008  . Hyperlipidemia 06/06/2008  . MENOPAUSAL SYNDROME 03/29/2008  . Osteopenia 03/29/2008  . ALLERGIC RHINITIS 01/25/2008  . IBS 01/25/2008  . OSTEOARTHRITIS 01/25/2008  . FIBROMYALGIA 01/25/2008    Jearld Lesch., PT 06/14/2019, 3:06 PM  [facility] [address] [place], [place], [address] Phone: [phone]   Fax:  [phone]  Name: Linda Mcgrath MRN: [id number] Date of Birth:  01-Feb-1933

## 2019-06-17 ENCOUNTER — Other Ambulatory Visit: Payer: Self-pay

## 2019-06-17 ENCOUNTER — Ambulatory Visit: Payer: Medicare Other | Admitting: Physical Therapy

## 2019-06-17 ENCOUNTER — Encounter: Payer: Self-pay | Admitting: Physical Therapy

## 2019-06-17 DIAGNOSIS — M545 Low back pain: Secondary | ICD-10-CM | POA: Diagnosis not present

## 2019-06-17 DIAGNOSIS — R262 Difficulty in walking, not elsewhere classified: Secondary | ICD-10-CM

## 2019-06-17 DIAGNOSIS — M62830 Muscle spasm of back: Secondary | ICD-10-CM

## 2019-06-17 DIAGNOSIS — M5441 Lumbago with sciatica, right side: Secondary | ICD-10-CM | POA: Diagnosis not present

## 2019-06-17 DIAGNOSIS — G8929 Other chronic pain: Secondary | ICD-10-CM | POA: Diagnosis not present

## 2019-06-17 NOTE — Therapy (Signed)
Linda Mcgrath [address] [place], [place], [address] Phone: [phone]   Fax:  [phone]  Physical Therapy Treatment  Patient Details  Name: Linda Mcgrath MRN: [id number] Date of Birth: 07-Sep-1932 Referring Provider (PT): [name]   Encounter Date: 06/17/2019  PT End of Session - 06/17/19 1420    Visit Number  19    Date for PT Re-Evaluation  07/16/19    PT Start Time  1350    PT Stop Time  1430    PT Time Calculation (min)  40 min    Activity Tolerance  Patient tolerated treatment well    Behavior During Therapy  [facility] for tasks assessed/performed       Past Medical History:  Diagnosis Date  . Allergic rhinitis   . Asthma   . Cataract    Bil/lens implant  . Colon polyp 1999   small polyp  . Diverticulosis   . Dysrhythmia   . Fibromyalgia   . Hyperlipidemia   . Hypothyroidism   . Internal hemorrhoids   . Leg cramps   . Lung nodule    stable LUL 9 mm  . Menopausal syndrome   . Osteoarthritis   . UTI (urinary tract infection)     Past Surgical History:  Procedure Laterality Date  . Abd U/S  11/1998   negative  . Abd U/S  01/2001   gallbladder polyps  . ABDOMINAL HYSTERECTOMY  1975   total-fibroids  . APPENDECTOMY  1975  . BREAST BIOPSY     benign  . BREAST EXCISIONAL BIOPSY Left 1957  . CARDIOVERSION N/A 03/07/2016   Procedure: CARDIOVERSION;  Surgeon: Adrian Prows, MD;  Location: [place];  Service: Cardiovascular;  Laterality: N/A;  . CATARACT EXTRACTION     bilateral  . CHOLECYSTECTOMY    . COLONOSCOPY  1998   Diverticulosis; polyp\  . COLONOSCOPY  09/2002   Diverticulosis, hem  . COLONOSCOPY  12/2007   diverticulosis, polyp  . DEXA  10/2001   osteopenia  . ELECTROPHYSIOLOGIC STUDY N/A 04/03/2016   Procedure: A-Flutter Ablation;  Surgeon: Will Meredith Leeds, MD;  Location: [place];  Service: Cardiovascular;  Laterality: N/A;  . ESOPHAGOGASTRODUODENOSCOPY  2004  . Hida  scan  01/2001   Negative  . KNEE SURGERY     right knee / 11/2004  . LAMINECTOMY WITH POSTERIOR LATERAL ARTHRODESIS LEVEL 2 N/A 07/31/2017   Procedure: DECOMPRESSIVE LAMINECTOMY LUMABR FOUR-FIVE, LUMBAR FIVE-SACRAL ONE;  Surgeon: Eustace Moore, MD;  Location: [place];  Service: Neurosurgery;  Laterality: N/A;  . laser surgery for glaucoma Left Sep 21, 2014  . ROTATOR CUFF REPAIR     x 2 /right shoulder  . SKIN CANCER EXCISION     pre-melanoma / on face  . TEE WITHOUT CARDIOVERSION N/A 03/07/2016   Procedure: TRANSESOPHAGEAL ECHOCARDIOGRAM (TEE);  Surgeon: Adrian Prows, MD;  Location: [facility];  Service: Cardiovascular;  Laterality: N/A;  . TOE SURGERY     rt foot     There were no vitals filed for this visit.  Subjective Assessment - 06/17/19 1349    Subjective  Patient reports that she is really hurting again, she is blaming it on the drug Fosomax but is unsure, reports painin the "long bones"    Currently in Pain?  Yes    Pain Score  5     Pain Location  Back   in the joints   Aggravating Factors   thinks some of  Prediabetes 06/03/2011  . HYPERTENSION, BENIGN ESSENTIAL 10/23/2010  . CONSTIPATION 10/23/2010  . HEMATOCHEZIA 10/23/2010  . OSTEOARTHRITIS, HANDS, BILATERAL 01/24/2010  . Hypothyroidism 08/24/2008  . Vitamin D deficiency 06/06/2008  . Hyperlipidemia 06/06/2008  . MENOPAUSAL SYNDROME 03/29/2008  . Osteopenia 03/29/2008  . ALLERGIC RHINITIS 01/25/2008  . IBS 01/25/2008  . OSTEOARTHRITIS 01/25/2008  . FIBROMYALGIA 01/25/2008    Linda Boast., PT 06/17/2019, 2:24 PM  [place] [address] [place], [place], [address] Phone: [phone]   Fax:  [phone]  Name: Linda Mcgrath MRN: [id number] Date of Birth: 1932-10-05  Linda Mcgrath [place], [place], [address] Phone: [phone]   Fax:  [phone]  Physical Therapy Treatment  Patient Details  Name: Linda Mcgrath MRN: [id number] Date of Birth: 07-Sep-1932 Referring Provider (PT): [name]   Encounter Date: 06/17/2019  PT End of Session - 06/17/19 1420    Visit Number  19    Date for PT Re-Evaluation  07/16/19    PT Start Time  1350    PT Stop Time  1430    PT Time Calculation (min)  40 min    Activity Tolerance  Patient tolerated treatment well    Behavior During Therapy  [facility] for tasks assessed/performed       Past Medical History:  Diagnosis Date  . Allergic rhinitis   . Asthma   . Cataract    Bil/lens implant  . Colon polyp 1999   small polyp  . Diverticulosis   . Dysrhythmia   . Fibromyalgia   . Hyperlipidemia   . Hypothyroidism   . Internal hemorrhoids   . Leg cramps   . Lung nodule    stable LUL 9 mm  . Menopausal syndrome   . Osteoarthritis   . UTI (urinary tract infection)     Past Surgical History:  Procedure Laterality Date  . Abd U/S  11/1998   negative  . Abd U/S  01/2001   gallbladder polyps  . ABDOMINAL HYSTERECTOMY  1975   total-fibroids  . APPENDECTOMY  1975  . BREAST BIOPSY     benign  . BREAST EXCISIONAL BIOPSY Left 1957  . CARDIOVERSION N/A 03/07/2016   Procedure: CARDIOVERSION;  Surgeon: Adrian Prows, MD;  Location: [facility];  Service: Cardiovascular;  Laterality: N/A;  . CATARACT EXTRACTION     bilateral  . CHOLECYSTECTOMY    . COLONOSCOPY  1998   Diverticulosis; polyp\  . COLONOSCOPY  09/2002   Diverticulosis, hem  . COLONOSCOPY  12/2007   diverticulosis, polyp  . DEXA  10/2001   osteopenia  . ELECTROPHYSIOLOGIC STUDY N/A 04/03/2016   Procedure: A-Flutter Ablation;  Surgeon: Will Meredith Leeds, MD;  Location: [facility];  Service: Cardiovascular;  Laterality: N/A;  . ESOPHAGOGASTRODUODENOSCOPY  2004  . Hida  scan  01/2001   Negative  . KNEE SURGERY     right knee / 11/2004  . LAMINECTOMY WITH POSTERIOR LATERAL ARTHRODESIS LEVEL 2 N/A 07/31/2017   Procedure: DECOMPRESSIVE LAMINECTOMY LUMABR FOUR-FIVE, LUMBAR FIVE-SACRAL ONE;  Surgeon: Eustace Moore, MD;  Location: [facility];  Service: Neurosurgery;  Laterality: N/A;  . laser surgery for glaucoma Left Sep 21, 2014  . ROTATOR CUFF REPAIR     x 2 /right shoulder  . SKIN CANCER EXCISION     pre-melanoma / on face  . TEE WITHOUT CARDIOVERSION N/A 03/07/2016   Procedure: TRANSESOPHAGEAL ECHOCARDIOGRAM (TEE);  Surgeon: Adrian Prows, MD;  Location: [facility];  Service: Cardiovascular;  Laterality: N/A;  . TOE SURGERY     rt foot     There were no vitals filed for this visit.  Subjective Assessment - 06/17/19 1349    Subjective  Patient reports that she is really hurting again, she is blaming it on the drug Fosomax but is unsure, reports painin the "long bones"    Currently in Pain?  Yes    Pain Score  5     Pain Location  Back   in the joints   Aggravating Factors   thinks some of

## 2019-06-21 ENCOUNTER — Ambulatory Visit: Payer: Medicare Other | Admitting: Physical Therapy

## 2019-06-21 ENCOUNTER — Other Ambulatory Visit: Payer: Self-pay

## 2019-06-21 ENCOUNTER — Encounter: Payer: Self-pay | Admitting: Physical Therapy

## 2019-06-21 DIAGNOSIS — M62830 Muscle spasm of back: Secondary | ICD-10-CM | POA: Diagnosis not present

## 2019-06-21 DIAGNOSIS — R262 Difficulty in walking, not elsewhere classified: Secondary | ICD-10-CM | POA: Diagnosis not present

## 2019-06-21 DIAGNOSIS — M545 Low back pain, unspecified: Secondary | ICD-10-CM

## 2019-06-21 DIAGNOSIS — M5441 Lumbago with sciatica, right side: Secondary | ICD-10-CM | POA: Diagnosis not present

## 2019-06-21 DIAGNOSIS — G8929 Other chronic pain: Secondary | ICD-10-CM

## 2019-06-21 NOTE — Therapy (Signed)
[address] [place], [place], [address] Phone: [phone]   Fax:  [phone] Progress Note Reporting Period  05/20/19 to 06/21/19 for visits 11-20  See note below for Objective Data and Assessment of Progress/Goals.      Physical Therapy Treatment  Patient Details  Name: Linda Mcgrath MRN: [id number] Date of Birth: [birth year] Referring Provider (PT): [name]   Encounter Date: 06/21/2019  PT End of Session - 06/21/19 1429    Visit Number  20    Date for PT Re-Evaluation  07/16/19    PT Start Time  1353    PT Stop Time  1440    PT Time Calculation (min)  47 min    Activity Tolerance  Patient tolerated treatment well    Behavior During Therapy  [facility] for tasks assessed/performed       Past Medical History:  Diagnosis Date  . Allergic rhinitis   . Asthma   . Cataract    Bil/lens implant  . Colon polyp 1999   small polyp  . Diverticulosis   . Dysrhythmia   . Fibromyalgia   . Hyperlipidemia   . Hypothyroidism   . Internal hemorrhoids   . Leg cramps   . Lung nodule    stable LUL 9 mm  . Menopausal syndrome   . Osteoarthritis   . UTI (urinary tract infection)     Past Surgical History:  Procedure Laterality Date  . Abd U/S  11/1998   negative  . Abd U/S  01/2001   gallbladder polyps  . ABDOMINAL HYSTERECTOMY  1975   total-fibroids  . APPENDECTOMY  1975  . BREAST BIOPSY     benign  . BREAST EXCISIONAL BIOPSY Left 1957  . CARDIOVERSION N/A 03/07/2016   Procedure: CARDIOVERSION;  Surgeon: Adrian Prows, MD;  Location: [facility];  Service: Cardiovascular;  Laterality: N/A;  . CATARACT EXTRACTION     bilateral  . CHOLECYSTECTOMY    . COLONOSCOPY  1998   Diverticulosis; polyp\  . COLONOSCOPY  09/2002   Diverticulosis, hem  . COLONOSCOPY  12/2007   diverticulosis, polyp  . DEXA  10/2001   osteopenia  . ELECTROPHYSIOLOGIC STUDY N/A 04/03/2016   Procedure: A-Flutter Ablation;   Surgeon: Will Meredith Leeds, MD;  Location: [place] [facility];  Service: Cardiovascular;  Laterality: N/A;  . ESOPHAGOGASTRODUODENOSCOPY  2004  . Hida scan  01/2001   Negative  . KNEE SURGERY     right knee / 11/2004  . LAMINECTOMY WITH POSTERIOR LATERAL ARTHRODESIS LEVEL 2 N/A 07/31/2017   Procedure: DECOMPRESSIVE LAMINECTOMY LUMABR FOUR-FIVE, LUMBAR FIVE-SACRAL ONE;  Surgeon: Eustace Moore, MD;  Location: [facility];  Service: Neurosurgery;  Laterality: N/A;  . laser surgery for glaucoma Left Sep 21, 2014  . ROTATOR CUFF REPAIR     x 2 /right shoulder  . SKIN CANCER EXCISION     pre-melanoma / on face  . TEE WITHOUT CARDIOVERSION N/A 03/07/2016   Procedure: TRANSESOPHAGEAL ECHOCARDIOGRAM (TEE);  Surgeon: Adrian Prows, MD;  Location: [facility];  Service: Cardiovascular;  Laterality: N/A;  . TOE SURGERY     rt foot     There were no vitals filed for this visit.  Subjective Assessment - 06/21/19 1427    Subjective  Continues to talk about joint and bone pain, I asked her to call her MD    Currently in Pain?  Yes    Pain Score  6     Pain  [address] [place], [place], [address] Phone: [phone]   Fax:  [phone] Progress Note Reporting Period  05/20/19 to 06/21/19 for visits 11-20  See note below for Objective Data and Assessment of Progress/Goals.      Physical Therapy Treatment  Patient Details  Name: Linda Mcgrath MRN: [id number] Date of Birth: [birth year] Referring Provider (PT): [name]   Encounter Date: 06/21/2019  PT End of Session - 06/21/19 1429    Visit Number  20    Date for PT Re-Evaluation  07/16/19    PT Start Time  1353    PT Stop Time  1440    PT Time Calculation (min)  47 min    Activity Tolerance  Patient tolerated treatment well    Behavior During Therapy  [facility] for tasks assessed/performed       Past Medical History:  Diagnosis Date  . Allergic rhinitis   . Asthma   . Cataract    Bil/lens implant  . Colon polyp 1999   small polyp  . Diverticulosis   . Dysrhythmia   . Fibromyalgia   . Hyperlipidemia   . Hypothyroidism   . Internal hemorrhoids   . Leg cramps   . Lung nodule    stable LUL 9 mm  . Menopausal syndrome   . Osteoarthritis   . UTI (urinary tract infection)     Past Surgical History:  Procedure Laterality Date  . Abd U/S  11/1998   negative  . Abd U/S  01/2001   gallbladder polyps  . ABDOMINAL HYSTERECTOMY  1975   total-fibroids  . APPENDECTOMY  1975  . BREAST BIOPSY     benign  . BREAST EXCISIONAL BIOPSY Left 1957  . CARDIOVERSION N/A 03/07/2016   Procedure: CARDIOVERSION;  Surgeon: Adrian Prows, MD;  Location: [facility];  Service: Cardiovascular;  Laterality: N/A;  . CATARACT EXTRACTION     bilateral  . CHOLECYSTECTOMY    . COLONOSCOPY  1998   Diverticulosis; polyp\  . COLONOSCOPY  09/2002   Diverticulosis, hem  . COLONOSCOPY  12/2007   diverticulosis, polyp  . DEXA  10/2001   osteopenia  . ELECTROPHYSIOLOGIC STUDY N/A 04/03/2016   Procedure: A-Flutter Ablation;   Surgeon: Will Meredith Leeds, MD;  Location: [place] [facility];  Service: Cardiovascular;  Laterality: N/A;  . ESOPHAGOGASTRODUODENOSCOPY  2004  . Hida scan  01/2001   Negative  . KNEE SURGERY     right knee / 11/2004  . LAMINECTOMY WITH POSTERIOR LATERAL ARTHRODESIS LEVEL 2 N/A 07/31/2017   Procedure: DECOMPRESSIVE LAMINECTOMY LUMABR FOUR-FIVE, LUMBAR FIVE-SACRAL ONE;  Surgeon: Eustace Moore, MD;  Location: [facility];  Service: Neurosurgery;  Laterality: N/A;  . laser surgery for glaucoma Left Sep 21, 2014  . ROTATOR CUFF REPAIR     x 2 /right shoulder  . SKIN CANCER EXCISION     pre-melanoma / on face  . TEE WITHOUT CARDIOVERSION N/A 03/07/2016   Procedure: TRANSESOPHAGEAL ECHOCARDIOGRAM (TEE);  Surgeon: Adrian Prows, MD;  Location: [facility];  Service: Cardiovascular;  Laterality: N/A;  . TOE SURGERY     rt foot     There were no vitals filed for this visit.  Subjective Assessment - 06/21/19 1427    Subjective  Continues to talk about joint and bone pain, I asked her to call her MD    Currently in Pain?  Yes    Pain Score  6     Pain  endurance, Decreased range of motion, Decreased strength, Impaired flexibility, Difficulty walking, Decreased balance  Visit  Diagnosis: Chronic right-sided low back pain without sciatica     Problem List Patient Active Problem List   Diagnosis Date Noted  . Diarrhea 07/03/2018  . Dyspepsia 07/03/2018  . S/P lumbar spinal fusion 07/31/2017  . Right low back pain 03/04/2017  . H/O atrial flutter 03/06/2016  . Obesity 11/06/2015  . Urge incontinence 01/24/2015  . Estrogen deficiency 10/25/2014  . Lichen planus 53/00/5110  . Encounter for Medicare annual wellness exam 06/17/2013  . Screening mammogram, encounter for 06/15/2012  . Prediabetes 06/03/2011  . HYPERTENSION, BENIGN ESSENTIAL 10/23/2010  . CONSTIPATION 10/23/2010  . HEMATOCHEZIA 10/23/2010  . OSTEOARTHRITIS, HANDS, BILATERAL 01/24/2010  . Hypothyroidism 08/24/2008  . Vitamin D deficiency 06/06/2008  . Hyperlipidemia 06/06/2008  . MENOPAUSAL SYNDROME 03/29/2008  . Osteopenia 03/29/2008  . ALLERGIC RHINITIS 01/25/2008  . IBS 01/25/2008  . OSTEOARTHRITIS 01/25/2008  . FIBROMYALGIA 01/25/2008    [name], PT 06/21/2019, 2:32 PM  [address] [place], [place], [address] Phone: [phone]   Fax:  [phone]  Name: Linda Mcgrath MRN: [id number] Date of Birth: [birth year]

## 2019-06-22 ENCOUNTER — Telehealth: Payer: Self-pay | Admitting: Family Medicine

## 2019-06-22 NOTE — Telephone Encounter (Signed)
Patient dropped off envelope regarding her medications.  Envelope's in rx [facility].

## 2019-06-24 ENCOUNTER — Ambulatory Visit: Payer: Medicare Other | Admitting: Physical Therapy

## 2019-06-24 NOTE — Telephone Encounter (Signed)
In in box.

## 2019-06-25 NOTE — Telephone Encounter (Signed)
Please remove alendronate from med list and add to intolerance list

## 2019-06-25 NOTE — Telephone Encounter (Signed)
Pt notified med d/c off med list and added to allergy

## 2019-06-30 ENCOUNTER — Ambulatory Visit: Payer: Medicare Other | Attending: Family Medicine | Admitting: Physical Therapy

## 2019-06-30 ENCOUNTER — Other Ambulatory Visit: Payer: Self-pay

## 2019-06-30 ENCOUNTER — Encounter: Payer: Self-pay | Admitting: Physical Therapy

## 2019-06-30 DIAGNOSIS — R262 Difficulty in walking, not elsewhere classified: Secondary | ICD-10-CM | POA: Diagnosis not present

## 2019-06-30 DIAGNOSIS — G8929 Other chronic pain: Secondary | ICD-10-CM | POA: Diagnosis not present

## 2019-06-30 DIAGNOSIS — M545 Low back pain, unspecified: Secondary | ICD-10-CM

## 2019-06-30 DIAGNOSIS — M62830 Muscle spasm of back: Secondary | ICD-10-CM | POA: Insufficient documentation

## 2019-06-30 NOTE — Therapy (Signed)
[address] [place], [place], [address] Phone: [phone]   Fax:  [phone]  Physical Therapy Treatment  Patient Details  Name: Linda Mcgrath MRN: [id number] Date of Birth: November 26, 1932 Referring Provider (PT): [name]   Encounter Date: 06/30/2019  PT End of Session - 06/30/19 1425    Visit Number  21    PT Start Time  1350    PT Stop Time  [id number]    PT Time Calculation (min)  55 min    Activity Tolerance  Patient tolerated treatment well    Behavior During Therapy  [facility] for tasks assessed/performed       Past Medical History:  Diagnosis Date  . Allergic rhinitis   . Asthma   . Cataract    Bil/lens implant  . Colon polyp 1999   small polyp  . Diverticulosis   . Dysrhythmia   . Fibromyalgia   . Hyperlipidemia   . Hypothyroidism   . Internal hemorrhoids   . Leg cramps   . Lung nodule    stable LUL 9 mm  . Menopausal syndrome   . Osteoarthritis   . UTI (urinary tract infection)     Past Surgical History:  Procedure Laterality Date  . Abd U/S  11/1998   negative  . Abd U/S  01/2001   gallbladder polyps  . ABDOMINAL HYSTERECTOMY  1975   total-fibroids  . APPENDECTOMY  1975  . BREAST BIOPSY     benign  . BREAST EXCISIONAL BIOPSY Left 1957  . CARDIOVERSION N/A 03/07/2016   Procedure: CARDIOVERSION;  Surgeon: Adrian Prows, MD;  Location: [facility];  Service: Cardiovascular;  Laterality: N/A;  . CATARACT EXTRACTION     bilateral  . CHOLECYSTECTOMY    . COLONOSCOPY  1998   Diverticulosis; polyp\  . COLONOSCOPY  09/2002   Diverticulosis, hem  . COLONOSCOPY  12/2007   diverticulosis, polyp  . DEXA  10/2001   osteopenia  . ELECTROPHYSIOLOGIC STUDY N/A 04/03/2016   Procedure: A-Flutter Ablation;  Surgeon: Will Meredith Leeds, MD;  Location: [place] [facility];  Service: Cardiovascular;  Laterality: N/A;  . ESOPHAGOGASTRODUODENOSCOPY  2004  . Hida scan  01/2001   Negative  . KNEE  SURGERY     right knee / 11/2004  . LAMINECTOMY WITH POSTERIOR LATERAL ARTHRODESIS LEVEL 2 N/A 07/31/2017   Procedure: DECOMPRESSIVE LAMINECTOMY LUMABR FOUR-FIVE, LUMBAR FIVE-SACRAL ONE;  Surgeon: Eustace Moore, MD;  Location: [facility];  Service: Neurosurgery;  Laterality: N/A;  . laser surgery for glaucoma Left Sep 21, 2014  . ROTATOR CUFF REPAIR     x 2 /right shoulder  . SKIN CANCER EXCISION     pre-melanoma / on face  . TEE WITHOUT CARDIOVERSION N/A 03/07/2016   Procedure: TRANSESOPHAGEAL ECHOCARDIOGRAM (TEE);  Surgeon: Adrian Prows, MD;  Location: [facility];  Service: Cardiovascular;  Laterality: N/A;  . TOE SURGERY     rt foot     There were no vitals filed for this visit.  Subjective Assessment - 06/30/19 1422    Subjective  Patient called her MD and stopped the medication that she thought was increasing her bone and joint pain.    Currently in Pain?  Yes    Pain Score  3     Pain Location  Back    Aggravating Factors   standing and stooping  not elsewhere classified     Problem List Patient Active Problem List   Diagnosis Date Noted  . Diarrhea 07/03/2018  . Dyspepsia 07/03/2018  . S/P lumbar spinal fusion 07/31/2017  . Right low back pain 03/04/2017  . H/O atrial flutter 03/06/2016  . Obesity 11/06/2015  . Urge incontinence 01/24/2015  . Estrogen deficiency 10/25/2014  . Lichen planus [id number]  . Encounter for Medicare annual wellness exam 06/17/2013  . Screening mammogram, encounter for 06/15/2012  . Prediabetes 06/03/2011  . HYPERTENSION, BENIGN ESSENTIAL 10/23/2010  . CONSTIPATION 10/23/2010  . HEMATOCHEZIA 10/23/2010  . OSTEOARTHRITIS, HANDS, BILATERAL 01/24/2010  . Hypothyroidism 08/24/2008  . Vitamin D deficiency 06/06/2008  . Hyperlipidemia 06/06/2008  . MENOPAUSAL SYNDROME 03/29/2008  . Osteopenia 03/29/2008  . ALLERGIC RHINITIS 01/25/2008  . IBS 01/25/2008  . OSTEOARTHRITIS 01/25/2008  . FIBROMYALGIA 01/25/2008    [name], PT 06/30/2019, 2:28 PM  [address] [place], [place], [address] Phone: [phone]   Fax:  [phone]  Name: Linda Mcgrath MRN: [id number] Date of Birth: 09-Aug-1933  [place] [address] [place], [place], [address] Phone: [phone]   Fax:  [phone]  Physical Therapy Treatment  Patient Details  Name: Linda Mcgrath MRN: [id number] Date of Birth: November 26, 1932 Referring Provider (PT): [name]   Encounter Date: 06/30/2019  PT End of Session - 06/30/19 1425    Visit Number  21    PT Start Time  1350    PT Stop Time  [id number]    PT Time Calculation (min)  55 min    Activity Tolerance  Patient tolerated treatment well    Behavior During Therapy  [facility] for tasks assessed/performed       Past Medical History:  Diagnosis Date  . Allergic rhinitis   . Asthma   . Cataract    Bil/lens implant  . Colon polyp 1999   small polyp  . Diverticulosis   . Dysrhythmia   . Fibromyalgia   . Hyperlipidemia   . Hypothyroidism   . Internal hemorrhoids   . Leg cramps   . Lung nodule    stable LUL 9 mm  . Menopausal syndrome   . Osteoarthritis   . UTI (urinary tract infection)     Past Surgical History:  Procedure Laterality Date  . Abd U/S  11/1998   negative  . Abd U/S  01/2001   gallbladder polyps  . ABDOMINAL HYSTERECTOMY  1975   total-fibroids  . APPENDECTOMY  1975  . BREAST BIOPSY     benign  . BREAST EXCISIONAL BIOPSY Left 1957  . CARDIOVERSION N/A 03/07/2016   Procedure: CARDIOVERSION;  Surgeon: Adrian Prows, MD;  Location: [facility];  Service: Cardiovascular;  Laterality: N/A;  . CATARACT EXTRACTION     bilateral  . CHOLECYSTECTOMY    . COLONOSCOPY  1998   Diverticulosis; polyp\  . COLONOSCOPY  09/2002   Diverticulosis, hem  . COLONOSCOPY  12/2007   diverticulosis, polyp  . DEXA  10/2001   osteopenia  . ELECTROPHYSIOLOGIC STUDY N/A 04/03/2016   Procedure: A-Flutter Ablation;  Surgeon: Will Meredith Leeds, MD;  Location: [facility] [facility];  Service: Cardiovascular;  Laterality: N/A;  . ESOPHAGOGASTRODUODENOSCOPY  2004  . Hida scan  01/2001   Negative  . KNEE  SURGERY     right knee / 11/2004  . LAMINECTOMY WITH POSTERIOR LATERAL ARTHRODESIS LEVEL 2 N/A 07/31/2017   Procedure: DECOMPRESSIVE LAMINECTOMY LUMABR FOUR-FIVE, LUMBAR FIVE-SACRAL ONE;  Surgeon: Eustace Moore, MD;  Location: [facility];  Service: Neurosurgery;  Laterality: N/A;  . laser surgery for glaucoma Left Sep 21, 2014  . ROTATOR CUFF REPAIR     x 2 /right shoulder  . SKIN CANCER EXCISION     pre-melanoma / on face  . TEE WITHOUT CARDIOVERSION N/A 03/07/2016   Procedure: TRANSESOPHAGEAL ECHOCARDIOGRAM (TEE);  Surgeon: Adrian Prows, MD;  Location: [facility];  Service: Cardiovascular;  Laterality: N/A;  . TOE SURGERY     rt foot     There were no vitals filed for this visit.  Subjective Assessment - 06/30/19 1422    Subjective  Patient called her MD and stopped the medication that she thought was increasing her bone and joint pain.    Currently in Pain?  Yes    Pain Score  3     Pain Location  Back    Aggravating Factors   standing and stooping

## 2019-08-02 ENCOUNTER — Ambulatory Visit (INDEPENDENT_AMBULATORY_CARE_PROVIDER_SITE_OTHER): Payer: Medicare Other | Admitting: Family Medicine

## 2019-08-02 ENCOUNTER — Encounter: Payer: Self-pay | Admitting: Family Medicine

## 2019-08-02 ENCOUNTER — Other Ambulatory Visit: Payer: Self-pay

## 2019-08-02 VITALS — BP 126/74 | HR 71 | Temp 96.8°F | Ht 62.5 in | Wt 182.1 lb

## 2019-08-02 DIAGNOSIS — E6609 Other obesity due to excess calories: Secondary | ICD-10-CM

## 2019-08-02 DIAGNOSIS — Z6832 Body mass index (BMI) 32.0-32.9, adult: Secondary | ICD-10-CM | POA: Diagnosis not present

## 2019-08-02 DIAGNOSIS — M1711 Unilateral primary osteoarthritis, right knee: Secondary | ICD-10-CM | POA: Insufficient documentation

## 2019-08-02 DIAGNOSIS — M175 Other unilateral secondary osteoarthritis of knee: Secondary | ICD-10-CM

## 2019-08-02 NOTE — Assessment & Plan Note (Signed)
Discussed how this problem influences overall health and the risks it imposes  Reviewed plan for weight loss with lower calorie diet (via better food choices and also portion control or program like weight watchers) and exercise building up to or more than 30 minutes 5 days per week including some aerobic activity   Low impact/chair exercise would be helpful  Recently completed PT for back and now having knee problems

## 2019-08-02 NOTE — Progress Notes (Signed)
Subjective:    Patient ID: Linda Mcgrath, female    DOB: [birth year], 85 y.o.   MRN: [id number]  HPI  Pt presents with R knee pain   Wt Readings from Last 3 Encounters:  08/02/19 182 lb 2 oz (82.6 kg)  04/15/19 185 lb 1 oz (83.9 kg)  03/25/19 181 lb 6 oz (82.3 kg)   32.78 kg/m   Both of her knees have been swollen   Now for over a month she still has swelling /pain in R knee Has had 3 surgeries in the past  Has fallen on ice and had 2 meniscal repairs (WF)  She had a b9 mass removed over knee when young    Last knee film 4/19  DG Knee 4 Views W/Patella Right (Accession [id number]) (Order [id number]) Imaging Date: 12/01/2017 Department: [facility] [address] Released By: Alvina Chou Authorizing: Hannah Beat, MD  Exam Information  Status Exam Begun  Exam Ended   Final [99] 12/01/2017 11:56 AM 12/01/2017 11:57 AM  PACS Intelerad Image Link  Show images for DG Knee 4 Views W/Patella Right  Study Result  CLINICAL DATA:  RIGHT knee pain for over 1 year  EXAM: RIGHT KNEE - COMPLETE 4+ VIEW  COMPARISON:  None  FINDINGS: Osseous demineralization.  Tricompartmental osteoarthritic changes with joint space narrowing and spur formation.  No acute fracture, dislocation, or bone destruction.  Bone-on-bone appearance at medial compartment.  No knee joint effusion.  Comparison AP view of the LEFT knee demonstrates significant degenerative changes at medial compartment as well.  IMPRESSION: Tricompartmental osteoarthritic changes of the RIGHT knee.  Additional osteoarthritic changes medial compartment LEFT knee.   Electronically Signed   By: Ulyses Southward M.D.   On: 12/01/2017 12:37   She saw Dr Patsy Lager at that time Noted she had knee surgery 24 y ago  Had a knee injection at that time  It really helped   No recent falls or new trauma   Never had another injection (last one was 4/19) Wants to avoid surgery  Walking  with a cane   Also has chronic back pain Finished her therapy -still bothers her Luisa Dago   Walking with a cane Takes advil prn  Uses heat occasionally Has used topical prep like Crista Elliot, and a thera works product   Patient Active Problem List   Diagnosis Date Noted  . Osteoarthritis of right knee 08/02/2019  . Diarrhea 07/03/2018  . Dyspepsia 07/03/2018  . S/P lumbar spinal fusion 07/31/2017  . Right low back pain 03/04/2017  . H/O atrial flutter 03/06/2016  . Obesity 11/06/2015  . Urge incontinence 01/24/2015  . Estrogen deficiency 10/25/2014  . Lichen planus 12/20/2013  . Encounter for Medicare annual wellness exam 06/17/2013  . Screening mammogram, encounter for 06/15/2012  . Prediabetes 06/03/2011  . HYPERTENSION, BENIGN ESSENTIAL 10/23/2010  . CONSTIPATION 10/23/2010  . HEMATOCHEZIA 10/23/2010  . OSTEOARTHRITIS, HANDS, BILATERAL 01/24/2010  . Hypothyroidism 08/24/2008  . Vitamin D deficiency 06/06/2008  . Hyperlipidemia 06/06/2008  . MENOPAUSAL SYNDROME 03/29/2008  . Osteopenia 03/29/2008  . ALLERGIC RHINITIS 01/25/2008  . IBS 01/25/2008  . OSTEOARTHRITIS 01/25/2008  . FIBROMYALGIA 01/25/2008   Past Medical History:  Diagnosis Date  . Allergic rhinitis   . Asthma   . Cataract    Bil/lens implant  . Colon polyp 1999   small polyp  . Diverticulosis   . Dysrhythmia   . Fibromyalgia   . Hyperlipidemia   . Hypothyroidism   . Internal hemorrhoids   .  Leg cramps   . Lung nodule    stable LUL 9 mm  . Menopausal syndrome   . Osteoarthritis   . UTI (urinary tract infection)    Past Surgical History:  Procedure Laterality Date  . Abd U/S  11/1998   negative  . Abd U/S  01/2001   gallbladder polyps  . ABDOMINAL HYSTERECTOMY  1975   total-fibroids  . APPENDECTOMY  1975  . BREAST BIOPSY     benign  . BREAST EXCISIONAL BIOPSY Left 1957  . CARDIOVERSION N/A 03/07/2016   Procedure: CARDIOVERSION;  Surgeon: Yates Decamp, MD;  Location: [facility] ENDOSCOPY;  Service:  Cardiovascular;  Laterality: N/A;  . CATARACT EXTRACTION     bilateral  . CHOLECYSTECTOMY    . COLONOSCOPY  1998   Diverticulosis; polyp\  . COLONOSCOPY  09/2002   Diverticulosis, hem  . COLONOSCOPY  12/2007   diverticulosis, polyp  . DEXA  10/2001   osteopenia  . ELECTROPHYSIOLOGIC STUDY N/A 04/03/2016   Procedure: A-Flutter Ablation;  Surgeon: Will [name], MD;  Location: [facility] INVASIVE CV LAB;  Service: Cardiovascular;  Laterality: N/A;  . ESOPHAGOGASTRODUODENOSCOPY  2004  . [place] scan  01/2001   Negative  . KNEE SURGERY     right knee / 11/2004  . LAMINECTOMY WITH POSTERIOR LATERAL ARTHRODESIS LEVEL 2 N/A 07/31/2017   Procedure: DECOMPRESSIVE LAMINECTOMY LUMABR FOUR-FIVE, LUMBAR FIVE-SACRAL ONE;  Surgeon: Tia Alert, MD;  Location: [facility] OR;  Service: Neurosurgery;  Laterality: N/A;  . laser surgery for glaucoma Left Sep 21, 2014  . ROTATOR CUFF REPAIR     x 2 /right shoulder  . SKIN CANCER EXCISION     pre-melanoma / on face  . TEE WITHOUT CARDIOVERSION N/A 03/07/2016   Procedure: TRANSESOPHAGEAL ECHOCARDIOGRAM (TEE);  Surgeon: Yates Decamp, MD;  Location: [facility] ENDOSCOPY;  Service: Cardiovascular;  Laterality: N/A;  . TOE SURGERY     rt foot    Social History   Tobacco Use  . Smoking status: Never Smoker  . Smokeless tobacco: Never Used  Substance Use Topics  . Alcohol use: No    Alcohol/week: 0.0 standard drinks    Comment: occasional-rare  . Drug use: No   Family History  Problem Relation Age of Onset  . Parkinsonism Father   . Colon cancer Brother   . Allergies Mother   . Heart disease Mother   . Kidney failure Son        congenital  (kidney transplant)   . Heart failure Son        died suddenly of CHF    Allergies  Allergen Reactions  . Shellfish Allergy Anaphylaxis and Nausea And Vomiting  . Tetracycline Hives, Nausea And Vomiting, Rash and Other (See Comments)    SYSTEMIC REACTION: heart palpitations,muscle spasms, vomiting  . Amlodipine Besylate Other (See  Comments)    REACTION: muscle spasms, extremity swelling, low bp  . Ciprofloxacin Other (See Comments)    REACTION: muscle spasms, insomnia  . Codeine Other (See Comments)    REACTION: hallucinations  . Fosamax [Alendronate Sodium]   . Propoxyphene Hcl Other (See Comments)    Unknown  . Sulfamethoxazole-Trimethoprim Nausea Only and Other (See Comments)    REACTION: dizzy, could not focus eyes and could not concentrate and nausea  . Tape Other (See Comments)    BANDAIDS TAKE OFF HER SKIN; PLEASE USE COBAN OR PAPER   . Xarelto [Rivaroxaban] Other (See Comments)    Aches and pains and nervousness  . Amoxicillin Rash  .  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 Penicillins Swelling, Rash and Other (See Comments)    Has patient had a PCN reaction causing immediate rash, facial/tongue/throat swelling, SOB or lightheadedness with hypotension: Yes Has patient had a PCN reaction causing severe rash involving mucus membranes or skin necrosis: No Has patient had a PCN reaction that required hospitalization No Has patient had a PCN reaction occurring within the last 10 years: No If all of the above answers are "NO", then may proceed with Cephalosporin use.    Current Outpatient Medications on File Prior to Visit  Medication Sig Dispense Refill  . Ascorbic Acid (VITAMIN C) 1000 MG tablet Take 1,000 mg by mouth daily.      . B Complex Vitamins (VITAMIN B COMPLEX PO) Take 1 tablet by mouth daily.    . Biotin 10 MG TABS Take 1 tablet by mouth every morning.     . Calcium Citrate-Vitamin D (CALCIUM CITRATE + D) 250-200 MG-UNIT TABS Take 1 tablet by mouth daily.    . cholecalciferol (VITAMIN D) 1000 units tablet Take 1,000 Units by mouth daily.    Marland Kitchen estradiol (ESTRACE) 1 MG tablet Take 1 tablet (1 mg total) by mouth daily. 90 tablet 3   . famotidine (PEPCID) 20 MG tablet Take 1 tablet (20 mg total) by mouth 2 (two) times daily. 60 tablet 3  . Fexofenadine HCl (MUCINEX ALLERGY PO) Take 1 tablet by mouth daily as needed (Allergies).     . folic acid (FOLVITE) 1 MG tablet Take 1 mg by mouth daily.    Marland Kitchen levothyroxine (SYNTHROID) 88 MCG tablet Take 1 tablet (88 mcg total) by mouth daily with breakfast. 90 tablet 3  . loratadine (CLARITIN) 10 MG tablet Take 10 mg by mouth daily as needed for allergies.     Marland Kitchen MAGNESIUM-OXIDE PO Take 1 tablet by mouth daily.    . Methenamine-Sodium Salicylate (AZO URINARY TRACT DEFENSE PO) Take 1 capsule by mouth daily.    . Multiple Vitamin (MULTIVITAMIN) capsule Take 1 capsule by mouth daily.      . TURMERIC PO Take 1 capsule by mouth 2 (two) times daily.     . vitamin B-12 (CYANOCOBALAMIN) 500 MCG tablet Take 500 mcg by mouth daily.     No current facility-administered medications on file prior to visit.     Review of Systems  Constitutional: Negative for activity change, appetite change, fatigue, fever and unexpected weight change.  HENT: Negative for congestion, ear pain, rhinorrhea, sinus pressure and sore throat.   Eyes: Negative for pain, redness and visual disturbance.  Respiratory: Negative for cough, shortness of breath and wheezing.   Cardiovascular: Negative for chest pain and palpitations.  Gastrointestinal: Negative for abdominal pain, blood in stool, constipation and diarrhea.  Endocrine: Negative for polydipsia and polyuria.  Genitourinary: Negative for dysuria, frequency and urgency.  Musculoskeletal: Positive for arthralgias and back pain. Negative for myalgias.       Pain in knees - much more the right   Skin: Negative for pallor and rash.  Allergic/Immunologic: Negative for environmental allergies.  Neurological: Negative for dizziness, syncope and headaches.  Hematological: Negative for adenopathy. Does not bruise/bleed easily.  Psychiatric/Behavioral: Negative for  decreased concentration and dysphoric mood. The patient is not nervous/anxious.        Objective:  Physical Exam Constitutional:      General: She is not in acute distress.    Appearance: Normal appearance. She is well-developed. She is obese. She is not ill-appearing.  HENT:     Head: Normocephalic and atraumatic.  Eyes:     General: No scleral icterus.    Conjunctiva/sclera: Conjunctivae normal.     Pupils: Pupils are equal, round, and reactive to light.  Neck:     Musculoskeletal: Normal range of motion and neck supple.  Cardiovascular:     Rate and Rhythm: Normal rate and regular rhythm.  Pulmonary:     Effort: Pulmonary effort is normal.     Breath sounds: Normal breath sounds. No wheezing or rales.  Abdominal:     General: Bowel sounds are normal. There is no distension.     Palpations: Abdomen is soft.     Tenderness: There is no abdominal tenderness.  Musculoskeletal:        General: Tenderness present.     Right knee: She exhibits decreased range of motion, swelling and bony tenderness. She exhibits no effusion, no deformity, no erythema, normal alignment and normal patellar mobility. Tenderness found. Medial joint line and lateral joint line tenderness noted.     Lumbar back: She exhibits decreased range of motion, tenderness and spasm. She exhibits no bony tenderness and no edema.     Comments: R knee- pain with flexion over 90 deg  Nl extension and no pain with bounce  No effusion noted but puffy anteriorly  Tender over med joint line more than lat  No skin changes  Mild crepitus Favors L leg-walking with a cane    Lymphadenopathy:     Cervical: No cervical adenopathy.  Skin:    General: Skin is warm and dry.     Coloration: Skin is not pale.     Findings: No erythema or rash.  Neurological:     Mental Status: She is alert.     Cranial Nerves: No cranial nerve deficit.     Sensory: No sensory deficit.     Motor: No weakness, atrophy or abnormal muscle  tone.     Coordination: Coordination normal.     Deep Tendon Reflexes: Reflexes are normal and symmetric.     Comments: Negative SLR  Psychiatric:        Mood and Affect: Mood normal.           Assessment & Plan:   Problem List Items Addressed This Visit      Musculoskeletal and Integument   Osteoarthritis of right knee - Primary    Suspect approaching end stage (rev last xr) and pt would like to avoid knee replacement  Interested in another cortisone injection -ref made to f/u with Dr Patsy Lager  He also mentioned the possibility of visco supplementation in the future  Rev this with pt  Has failed other consv care incl otc med Enc wt loss which may help a bit as well Disc use of ice/heat -depending on what works  Also low impact exercise         Other   Obesity    Discussed how this problem influences overall health and the risks it imposes  Reviewed plan for weight loss with lower calorie diet (via better food choices and also portion control or program like weight watchers) and exercise building up to or more than 30 minutes 5 days per week including some aerobic activity   Low impact/chair exercise would be helpful  Recently completed  PT for back and now having knee problems

## 2019-08-02 NOTE — Assessment & Plan Note (Signed)
Suspect approaching end stage (rev last xr) and pt would like to avoid knee replacement  Interested in another cortisone injection -ref made to f/u with Dr Lorelei Pont  He also mentioned the possibility of visco supplementation in the future  Rev this with pt  Has failed other consv care incl otc med Enc wt loss which may help a bit as well Disc use of ice/heat -depending on what works  Also low impact exercise

## 2019-08-02 NOTE — Patient Instructions (Addendum)
Stop at check out to get an appointment with Dr Lorelei Pont regarding your knee Continue heat if it helps Keep using your cane   If you develop any redness of the knee let us know

## 2019-08-03 ENCOUNTER — Encounter: Payer: Self-pay | Admitting: Family Medicine

## 2019-08-03 ENCOUNTER — Ambulatory Visit (INDEPENDENT_AMBULATORY_CARE_PROVIDER_SITE_OTHER): Payer: Medicare Other | Admitting: Family Medicine

## 2019-08-03 VITALS — BP 120/72 | HR 58 | Temp 97.2°F | Ht 62.5 in | Wt 183.5 lb

## 2019-08-03 DIAGNOSIS — M1711 Unilateral primary osteoarthritis, right knee: Secondary | ICD-10-CM | POA: Diagnosis not present

## 2019-08-03 MED ORDER — METHYLPREDNISOLONE ACETATE 40 MG/ML IJ SUSP
80.0000 mg | Freq: Once | INTRAMUSCULAR | Status: AC
  Administered 2019-08-03: 80 mg via INTRA_ARTICULAR

## 2019-08-03 NOTE — Progress Notes (Signed)
Lord Lancour T. Linda Mottley, MD Primary Care and [place] at [facility] [place], [address] Phone: [phone]  FAX: [phone]  ARROW EMMERICH - 85 y.o. female  MRN [id number]  Date of Birth: [birth year]  Visit Date: 08/03/2019  PCP: Abner Greenspan, MD  Referred by: [name], Wynelle Fanny, MD  Chief Complaint  Patient presents with  . Knee Pain    Right    This visit occurred during the SARS-CoV-2 public health emergency.  Safety protocols were in place, including screening questions prior to the visit, additional usage of staff PPE, and extensive cleaning of exam room while observing appropriate contact time as indicated for disinfecting solutions.   Subjective:   Linda Mcgrath is a 85 y.o. very pleasant female patient with Body mass index is 33.03 kg/m. who presents with the following:  R knee pain and OA:   18 months ago, saw her and injected her R knee. Finished some PT for back and her knee.  Had some back surgery in 12/18.  Fusion in the vertebrae of her back.  Still having a lump in her back.    She presents with an exacerbation of knee arthritis, right greater than left.  She does have significant restriction of motion.  The last time I injected her knee with corticosteroid she had relief greater than 12 months.  I have injected her left knee in the past as well, but today this is less symptomatic than the right.  Past Medical History, Surgical History, Social History, Family History, Problem List, Medications, and Allergies have been reviewed and updated if relevant.  Patient Active Problem List   Diagnosis Date Noted  . Osteoarthritis of right knee 08/02/2019  . Diarrhea 07/03/2018  . Dyspepsia 07/03/2018  . S/P lumbar spinal fusion 07/31/2017  . Right low back pain 03/04/2017  . H/O atrial flutter 03/06/2016  . Obesity 11/06/2015  . Urge incontinence 01/24/2015  . Estrogen deficiency 10/25/2014   . Lichen planus 51/76/1607  . Encounter for Medicare annual wellness exam 06/17/2013  . Screening mammogram, encounter for 06/15/2012  . Prediabetes 06/03/2011  . HYPERTENSION, BENIGN ESSENTIAL 10/23/2010  . CONSTIPATION 10/23/2010  . HEMATOCHEZIA 10/23/2010  . OSTEOARTHRITIS, HANDS, BILATERAL 01/24/2010  . Hypothyroidism 08/24/2008  . Vitamin D deficiency 06/06/2008  . Hyperlipidemia 06/06/2008  . MENOPAUSAL SYNDROME 03/29/2008  . Osteopenia 03/29/2008  . ALLERGIC RHINITIS 01/25/2008  . IBS 01/25/2008  . OSTEOARTHRITIS 01/25/2008  . FIBROMYALGIA 01/25/2008    Past Medical History:  Diagnosis Date  . Allergic rhinitis   . Asthma   . Cataract    Bil/lens implant  . Colon polyp 1999   small polyp  . Diverticulosis   . Dysrhythmia   . Fibromyalgia   . Hyperlipidemia   . Hypothyroidism   . Internal hemorrhoids   . Leg cramps   . Lung nodule    stable LUL 9 mm  . Menopausal syndrome   . Osteoarthritis   . UTI (urinary tract infection)     Past Surgical History:  Procedure Laterality Date  . Abd U/S  11/1998   negative  . Abd U/S  01/2001   gallbladder polyps  . ABDOMINAL HYSTERECTOMY  1975   total-fibroids  . APPENDECTOMY  1975  . BREAST BIOPSY     benign  . BREAST EXCISIONAL BIOPSY Left 1957  . CARDIOVERSION N/A 03/07/2016   Procedure: CARDIOVERSION;  Surgeon: Adrian Prows, MD;  Location: [place];  Service:  her significant over-the-counter supplements.  Aspiration/Injection Procedure Note Linda Mcgrath  06-04-1933 Date of procedure: 08/03/2019  Procedure: Large Joint Aspiration / Injection of Knee, R Indications: Pain  Procedure Details Patient verbally consented to procedure. Risks (including potential rare risk of infection), benefits, and alternatives explained. Sterilely prepped with Chloraprep. Ethyl cholride used for anesthesia. 8 cc Lidocaine 1% mixed with 2 mL Depo-Medrol 40 mg injected using the anteromedial approach without difficulty. No complications with procedure and tolerated well. Patient had decreased pain post-injection. Medication: 2 mL of Depo-Medrol 40 mg, equaling Depo-Medrol 80 mg total   Patient Instructions  OSTEOARTHRITIS:  For symptomatic relief:  Tylenol: 2 tablets up to 3-4 times a day Regular NSAIDS are helpful (avoid in kidney disease and ulcers) - RARELY  For flares, corticosteroid injections help. Hyaluronic Acid injections have good success, average relief is 6 months  Glucosamine and Chondroitin often helpful - will take about 3 months to see if you have an effect. If you do, great, keep them up, if none at that point, no need to take in the future.  Omega-3 fish oils may help, 2 grams daily  Ice joints on bad days, 20 min, 2-3 x / day REGULAR EXERCISE: swimming, Yoga, Tai Chi, bicycle (NON-IMPACT activity)   Weight loss will always take stress off of the joints and back    Follow-up: No follow-ups on file.  Meds ordered this encounter  Medications  . methylPREDNISolone acetate (DEPO-MEDROL) injection 80 mg   No orders of the defined types were placed in this encounter.   Signed,  Maud Deed. Anida Deol, MD   Outpatient Encounter Medications as of 08/03/2019  Medication Sig  . Ascorbic Acid (VITAMIN C) 1000 MG tablet Take 1,000 mg by mouth daily.    . B Complex Vitamins (VITAMIN B COMPLEX PO) Take 1 tablet by mouth daily.  . Biotin 10 MG TABS Take 1 tablet by mouth every morning.   . Calcium Citrate-Vitamin D (CALCIUM CITRATE + D) 250-200  MG-UNIT TABS Take 1 tablet by mouth daily.  . cholecalciferol (VITAMIN D) 1000 units tablet Take 1,000 Units by mouth daily.  Marland Kitchen estradiol (ESTRACE) 1 MG tablet Take 1 tablet (1 mg total) by mouth daily.  . famotidine (PEPCID) 20 MG tablet Take 1 tablet (20 mg total) by mouth 2 (two) times daily.  Marland Kitchen Fexofenadine HCl (MUCINEX ALLERGY PO) Take 1 tablet by mouth daily as needed (Allergies).   . folic acid (FOLVITE) 1 MG tablet Take 1 mg by mouth daily.  Marland Kitchen levothyroxine (SYNTHROID) 88 MCG tablet Take 1 tablet (88 mcg total) by mouth daily with breakfast.  . loratadine (CLARITIN) 10 MG tablet Take 10 mg by mouth daily as needed for allergies.   Marland Kitchen MAGNESIUM-OXIDE PO Take 1 tablet by mouth daily.  . Methenamine-Sodium Salicylate (AZO URINARY TRACT DEFENSE PO) Take 1 capsule by mouth daily.  . Multiple Vitamin (MULTIVITAMIN) capsule Take 1 capsule by mouth daily.    . TURMERIC PO Take 1 capsule by mouth 2 (two) times daily.   . vitamin B-12 (CYANOCOBALAMIN) 500 MCG tablet Take 500 mcg by mouth daily.  . [EXPIRED] methylPREDNISolone acetate (DEPO-MEDROL) injection 80 mg    No facility-administered encounter medications on file as of 08/03/2019.  her significant over-the-counter supplements.  Aspiration/Injection Procedure Note Linda Mcgrath  06-04-1933 Date of procedure: 08/03/2019  Procedure: Large Joint Aspiration / Injection of Knee, R Indications: Pain  Procedure Details Patient verbally consented to procedure. Risks (including potential rare risk of infection), benefits, and alternatives explained. Sterilely prepped with Chloraprep. Ethyl cholride used for anesthesia. 8 cc Lidocaine 1% mixed with 2 mL Depo-Medrol 40 mg injected using the anteromedial approach without difficulty. No complications with procedure and tolerated well. Patient had decreased pain post-injection. Medication: 2 mL of Depo-Medrol 40 mg, equaling Depo-Medrol 80 mg total   Patient Instructions  OSTEOARTHRITIS:  For symptomatic relief:  Tylenol: 2 tablets up to 3-4 times a day Regular NSAIDS are helpful (avoid in kidney disease and ulcers) - RARELY  For flares, corticosteroid injections help. Hyaluronic Acid injections have good success, average relief is 6 months  Glucosamine and Chondroitin often helpful - will take about 3 months to see if you have an effect. If you do, great, keep them up, if none at that point, no need to take in the future.  Omega-3 fish oils may help, 2 grams daily  Ice joints on bad days, 20 min, 2-3 x / day REGULAR EXERCISE: swimming, Yoga, Tai Chi, bicycle (NON-IMPACT activity)   Weight loss will always take stress off of the joints and back    Follow-up: No follow-ups on file.  Meds ordered this encounter  Medications  . methylPREDNISolone acetate (DEPO-MEDROL) injection 80 mg   No orders of the defined types were placed in this encounter.   Signed,  Maud Deed. Anida Deol, MD   Outpatient Encounter Medications as of 08/03/2019  Medication Sig  . Ascorbic Acid (VITAMIN C) 1000 MG tablet Take 1,000 mg by mouth daily.    . B Complex Vitamins (VITAMIN B COMPLEX PO) Take 1 tablet by mouth daily.  . Biotin 10 MG TABS Take 1 tablet by mouth every morning.   . Calcium Citrate-Vitamin D (CALCIUM CITRATE + D) 250-200  MG-UNIT TABS Take 1 tablet by mouth daily.  . cholecalciferol (VITAMIN D) 1000 units tablet Take 1,000 Units by mouth daily.  Marland Kitchen estradiol (ESTRACE) 1 MG tablet Take 1 tablet (1 mg total) by mouth daily.  . famotidine (PEPCID) 20 MG tablet Take 1 tablet (20 mg total) by mouth 2 (two) times daily.  Marland Kitchen Fexofenadine HCl (MUCINEX ALLERGY PO) Take 1 tablet by mouth daily as needed (Allergies).   . folic acid (FOLVITE) 1 MG tablet Take 1 mg by mouth daily.  Marland Kitchen levothyroxine (SYNTHROID) 88 MCG tablet Take 1 tablet (88 mcg total) by mouth daily with breakfast.  . loratadine (CLARITIN) 10 MG tablet Take 10 mg by mouth daily as needed for allergies.   Marland Kitchen MAGNESIUM-OXIDE PO Take 1 tablet by mouth daily.  . Methenamine-Sodium Salicylate (AZO URINARY TRACT DEFENSE PO) Take 1 capsule by mouth daily.  . Multiple Vitamin (MULTIVITAMIN) capsule Take 1 capsule by mouth daily.    . TURMERIC PO Take 1 capsule by mouth 2 (two) times daily.   . vitamin B-12 (CYANOCOBALAMIN) 500 MCG tablet Take 500 mcg by mouth daily.  . [EXPIRED] methylPREDNISolone acetate (DEPO-MEDROL) injection 80 mg    No facility-administered encounter medications on file as of 08/03/2019.  her significant over-the-counter supplements.  Aspiration/Injection Procedure Note Linda Mcgrath  06-04-1933 Date of procedure: 08/03/2019  Procedure: Large Joint Aspiration / Injection of Knee, R Indications: Pain  Procedure Details Patient verbally consented to procedure. Risks (including potential rare risk of infection), benefits, and alternatives explained. Sterilely prepped with Chloraprep. Ethyl cholride used for anesthesia. 8 cc Lidocaine 1% mixed with 2 mL Depo-Medrol 40 mg injected using the anteromedial approach without difficulty. No complications with procedure and tolerated well. Patient had decreased pain post-injection. Medication: 2 mL of Depo-Medrol 40 mg, equaling Depo-Medrol 80 mg total   Patient Instructions  OSTEOARTHRITIS:  For symptomatic relief:  Tylenol: 2 tablets up to 3-4 times a day Regular NSAIDS are helpful (avoid in kidney disease and ulcers) - RARELY  For flares, corticosteroid injections help. Hyaluronic Acid injections have good success, average relief is 6 months  Glucosamine and Chondroitin often helpful - will take about 3 months to see if you have an effect. If you do, great, keep them up, if none at that point, no need to take in the future.  Omega-3 fish oils may help, 2 grams daily  Ice joints on bad days, 20 min, 2-3 x / day REGULAR EXERCISE: swimming, Yoga, Tai Chi, bicycle (NON-IMPACT activity)   Weight loss will always take stress off of the joints and back    Follow-up: No follow-ups on file.  Meds ordered this encounter  Medications  . methylPREDNISolone acetate (DEPO-MEDROL) injection 80 mg   No orders of the defined types were placed in this encounter.   Signed,  Maud Deed. Anida Deol, MD   Outpatient Encounter Medications as of 08/03/2019  Medication Sig  . Ascorbic Acid (VITAMIN C) 1000 MG tablet Take 1,000 mg by mouth daily.    . B Complex Vitamins (VITAMIN B COMPLEX PO) Take 1 tablet by mouth daily.  . Biotin 10 MG TABS Take 1 tablet by mouth every morning.   . Calcium Citrate-Vitamin D (CALCIUM CITRATE + D) 250-200  MG-UNIT TABS Take 1 tablet by mouth daily.  . cholecalciferol (VITAMIN D) 1000 units tablet Take 1,000 Units by mouth daily.  Marland Kitchen estradiol (ESTRACE) 1 MG tablet Take 1 tablet (1 mg total) by mouth daily.  . famotidine (PEPCID) 20 MG tablet Take 1 tablet (20 mg total) by mouth 2 (two) times daily.  Marland Kitchen Fexofenadine HCl (MUCINEX ALLERGY PO) Take 1 tablet by mouth daily as needed (Allergies).   . folic acid (FOLVITE) 1 MG tablet Take 1 mg by mouth daily.  Marland Kitchen levothyroxine (SYNTHROID) 88 MCG tablet Take 1 tablet (88 mcg total) by mouth daily with breakfast.  . loratadine (CLARITIN) 10 MG tablet Take 10 mg by mouth daily as needed for allergies.   Marland Kitchen MAGNESIUM-OXIDE PO Take 1 tablet by mouth daily.  . Methenamine-Sodium Salicylate (AZO URINARY TRACT DEFENSE PO) Take 1 capsule by mouth daily.  . Multiple Vitamin (MULTIVITAMIN) capsule Take 1 capsule by mouth daily.    . TURMERIC PO Take 1 capsule by mouth 2 (two) times daily.   . vitamin B-12 (CYANOCOBALAMIN) 500 MCG tablet Take 500 mcg by mouth daily.  . [EXPIRED] methylPREDNISolone acetate (DEPO-MEDROL) injection 80 mg    No facility-administered encounter medications on file as of 08/03/2019.

## 2019-08-03 NOTE — Patient Instructions (Signed)
OSTEOARTHRITIS:  For symptomatic relief:  Tylenol: 2 tablets up to 3-4 times a day Regular NSAIDS are helpful (avoid in kidney disease and ulcers) - RARELY  For flares, corticosteroid injections help. Hyaluronic Acid injections have good success, average relief is 6 months  Glucosamine and Chondroitin often helpful - will take about 3 months to see if you have an effect. If you do, great, keep them up, if none at that point, no need to take in the future.  Omega-3 fish oils may help, 2 grams daily  Ice joints on bad days, 20 min, 2-3 x / day REGULAR EXERCISE: swimming, Yoga, Tai Chi, bicycle (NON-IMPACT activity)   Weight loss will always take stress off of the joints and back

## 2019-09-11 ENCOUNTER — Ambulatory Visit: Payer: Medicare Other | Attending: Internal Medicine

## 2019-09-11 DIAGNOSIS — Z23 Encounter for immunization: Secondary | ICD-10-CM | POA: Diagnosis not present

## 2019-09-11 NOTE — Progress Notes (Signed)
Covid-19 Vaccination Clinic  Name:  Linda Mcgrath    MRN: [id number] DOB: [birth year]  09/11/2019  Linda Mcgrath was observed post Covid-19 immunization for 15 minutes without incidence. She was provided with Vaccine Information Sheet and instruction to access the V-Safe system.   Linda Mcgrath was instructed to call 911 with any severe reactions post vaccine: Marland Kitchen Difficulty breathing  . Swelling of your face and throat  . A fast heartbeat  . A bad rash all over your body  . Dizziness and weakness    Immunizations Administered    Name Date Dose VIS Date Route   Pfizer COVID-19 Vaccine 09/11/2019 11:40 AM 0.3 mL 08/06/2019 Intramuscular   Manufacturer: [employer], [employer]   Lot: [id number]   NDC: [address]-8

## 2019-10-02 ENCOUNTER — Ambulatory Visit: Payer: Medicare Other | Attending: Internal Medicine

## 2019-10-02 DIAGNOSIS — Z23 Encounter for immunization: Secondary | ICD-10-CM

## 2019-10-02 NOTE — Progress Notes (Signed)
Covid-19 Vaccination Clinic  Name:  Linda Mcgrath    MRN: [id number] DOB: Sep 30, 1932  10/02/2019  Linda Mcgrath was observed post Covid-19 immunization for 15 minutes without incidence. She was provided with Vaccine Information Sheet and instruction to access the V-Safe system.   Linda Mcgrath was instructed to call 911 with any severe reactions post vaccine: Marland Kitchen Difficulty breathing  . Swelling of your face and throat  . A fast heartbeat  . A bad rash all over your body  . Dizziness and weakness    Immunizations Administered    Name Date Dose VIS Date Route   Pfizer COVID-19 Vaccine 10/02/2019 10:46 AM 0.3 mL 08/06/2019 Intramuscular   Manufacturer: [employer], [employer]   Lot: [id number]   NDC: [address]-6

## 2019-10-18 ENCOUNTER — Ambulatory Visit (INDEPENDENT_AMBULATORY_CARE_PROVIDER_SITE_OTHER): Payer: Medicare Other | Admitting: Family Medicine

## 2019-10-18 ENCOUNTER — Encounter: Payer: Self-pay | Admitting: Family Medicine

## 2019-10-18 ENCOUNTER — Other Ambulatory Visit: Payer: Self-pay

## 2019-10-18 DIAGNOSIS — M545 Low back pain, unspecified: Secondary | ICD-10-CM | POA: Insufficient documentation

## 2019-10-18 DIAGNOSIS — G8929 Other chronic pain: Secondary | ICD-10-CM | POA: Insufficient documentation

## 2019-10-18 DIAGNOSIS — Z1331 Encounter for screening for depression: Secondary | ICD-10-CM | POA: Insufficient documentation

## 2019-10-18 DIAGNOSIS — I10 Essential (primary) hypertension: Secondary | ICD-10-CM

## 2019-10-18 MED ORDER — METHOCARBAMOL 500 MG PO TABS: 500.0000 mg | ORAL_TABLET | Freq: Three times a day (TID) | ORAL | 1 refills | Status: DC | PRN

## 2019-10-18 NOTE — Assessment & Plan Note (Signed)
Pt had a score of 13 on her PHQ 9  Some of those questions inv sleep and fatigue- per pt acute and chronic pain explains it

## 2019-10-18 NOTE — Assessment & Plan Note (Signed)
bp better on 2nd check today bp in fair control at this time  BP Readings from Last 1 Encounters:  10/18/19 138/80   No changes needed Most recent labs reviewed  Disc lifstyle change with low sodium diet and exercise

## 2019-10-18 NOTE — Assessment & Plan Note (Signed)
Pt has had intermittent L and R low back pain  This is seemingly muscular given exam and lack of neuro symptoms (aware of prior back surgery)  Ref made to PT (helpful in the past) Also methocarbamol px-to use with caution (pt will try at night)  Continue walking and using heat Update if worse or new symptoms

## 2019-10-18 NOTE — Progress Notes (Signed)
Subjective:    Patient ID: Linda Mcgrath, female    DOB: 09-Apr-1933, 86 y.o.   MRN: [id number]  This visit occurred during the SARS-CoV-2 public health emergency.  Safety protocols were in place, including screening questions prior to the visit, additional usage of staff PPE, and extensive cleaning of exam room while observing appropriate contact time as indicated for disinfecting solutions.    HPI Pt presents with a pulled muscle in her back (in context of past back surgery and fibromyalgia)   Wt Readings from Last 3 Encounters:  10/18/19 184 lb 8 oz (83.7 kg)  08/03/19 183 lb 8 oz (83.2 kg)  08/02/19 182 lb 2 oz (82.6 kg)   33.21 kg/m    2 weeks ago-pt was lifting (big loads of clothes) in laundry mat- during a power outage  Rep motion   Hurts on the left side - under waist- going into the buttock A little improved today  Thinks she needs some more PT -it helps  Pain is dull in nature - occ sharp with certain movements  Originally could not bend forward or lift anything -has had to use a cane That is improved  Pain originally shot down her leg-now no longer   She put topical muscle rub on it  Also some heat- when she sits in a chair - that helps  Takes advil 2 at night--- occ 2 during the day (does not always take with food)  Taking vit D Takes glucosamine  She walks around in the house Cannot do a lot of her previous PT exercises by herself     Also hit her elbow - L / is swollen   She has baseline weakness in her knees -no difference    Pain keeps her up at night   H/o lumbar laminectomy in 2018   Had her covid vaccines !  Did well  Made her tired   Last lumbar xray:  DG Lumbar Spine Complete (Accession [id number]) (Order [id number]) Imaging Date: 03/04/2017 Department: [facility] [address] Released By: Alvina Chou Authorizing: Buren Havey, Audrie Gallus, MD  Exam Status  Status  Final [99]  PACS Intelerad Image Link  Show images  for DG Lumbar Spine Complete  Study Result  CLINICAL DATA:  Lumbago  EXAM: LUMBAR SPINE - COMPLETE 4+ VIEW  COMPARISON:  None.  FINDINGS: Frontal, lateral, spot lumbosacral lateral, and bilateral oblique views were obtained. There are 5 non-rib-bearing lumbar type vertebral bodies. There is a transitional S1 vertebra with assimilation joints bilaterally. There is slight thoracolumbar levoscoliosis. There is no evident fracture. There is 9 mm of anterolisthesis of L5 on S1. There is a no other appreciable spondylolisthesis. There appear to be pars defects at L5 bilaterally. There is moderate disc space narrowing at all levels, most notably at L4-5. There is facet osteoarthritic change at all levels bilaterally, most notably at L4-5 and L5-S1 bilaterally. There are foci of aortic atherosclerotic calcification.  IMPRESSION: Mild scoliosis. No fracture. Spondylolisthesis at L5-S1 with rather subtle pars defects bilaterally at L5. Multilevel osteoarthritic change noted, most notably in the lower lumbar region. There is aortic atherosclerosis.  Aortic Atherosclerosis (ICD10-I70.0).   Electronically Signed   By: Bretta Bang III M.D.   On: 03/05/2017 08:32   Some facet OA changes at all levels This was before her back surgery    Review of Systems     Objective:   Physical Exam Constitutional:      General: She is not in  acute distress.    Appearance: She is well-developed.  HENT:     Head: Normocephalic and atraumatic.  Eyes:     General: No scleral icterus.    Conjunctiva/sclera: Conjunctivae normal.     Pupils: Pupils are equal, round, and reactive to light.  Cardiovascular:     Rate and Rhythm: Normal rate and regular rhythm.  Pulmonary:     Effort: Pulmonary effort is normal.     Breath sounds: Normal breath sounds. No wheezing or rales.  Abdominal:     General: Bowel sounds are normal. There is no distension.     Palpations: Abdomen is soft.      Tenderness: There is no abdominal tenderness.  Musculoskeletal:        General: Tenderness present. No swelling, deformity or signs of injury.     Cervical back: Normal range of motion and neck supple.     Lumbar back: Spasms and tenderness present. No swelling, edema, deformity or bony tenderness. Decreased range of motion. Negative right straight leg raise test and negative left straight leg raise test.     Right lower leg: No edema.     Left lower leg: No edema.     Comments: Tight and tender L lumbar musculature  No piriformis tendernes Nl rom of hips  No neuro changes   LS flex: 80 deg Ext 10 deg  Pain with L lateral bend but not R   No significant kyphosis   Lymphadenopathy:     Cervical: No cervical adenopathy.  Skin:    General: Skin is warm and dry.     Coloration: Skin is not pale.     Findings: No erythema or rash.  Neurological:     Mental Status: She is alert.     Cranial Nerves: No cranial nerve deficit.     Sensory: No sensory deficit.     Motor: No atrophy or abnormal muscle tone.     Coordination: Coordination normal.     Gait: Gait normal.     Deep Tendon Reflexes: Reflexes are normal and symmetric. Reflexes normal.     Comments: Negative SLR  Psychiatric:        Mood and Affect: Mood normal.           Assessment & Plan:   Problem List Items Addressed This Visit      Cardiovascular and Mediastinum   HYPERTENSION, BENIGN ESSENTIAL    bp better on 2nd check today bp in fair control at this time  BP Readings from Last 1 Encounters:  10/18/19 138/80   No changes needed Most recent labs reviewed  Disc lifstyle change with low sodium diet and exercise          Other   Left low back pain    Pt has had intermittent L and R low back pain  This is seemingly muscular given exam and lack of neuro symptoms (aware of prior back surgery)  Ref made to PT (helpful in the past) Also methocarbamol px-to use with caution (pt will try at night)  Continue  walking and using heat Update if worse or new symptoms       Relevant Medications   methocarbamol (ROBAXIN) 500 MG tablet   Other Relevant Orders   Ambulatory referral to Physical Therapy   Positive depression screening    Pt had a score of 13 on her PHQ 9  Some of those questions inv sleep and fatigue- per pt acute and chronic pain explains it

## 2019-10-18 NOTE — Patient Instructions (Addendum)
Try methocarbamol (caution of sedation) for back pain -it is a muscle relaxer I placed a PT referral-the office will call you   Continue heat 10 minutes at a time  Also walking  Avoid heavy lifting  If suddenly worse -let us know

## 2019-10-20 ENCOUNTER — Other Ambulatory Visit: Payer: Self-pay

## 2019-10-20 ENCOUNTER — Encounter: Payer: Self-pay | Admitting: Physical Therapy

## 2019-10-20 ENCOUNTER — Ambulatory Visit: Payer: Medicare Other | Attending: Orthopaedic Surgery | Admitting: Physical Therapy

## 2019-10-20 DIAGNOSIS — M542 Cervicalgia: Secondary | ICD-10-CM | POA: Diagnosis not present

## 2019-10-20 DIAGNOSIS — R262 Difficulty in walking, not elsewhere classified: Secondary | ICD-10-CM | POA: Insufficient documentation

## 2019-10-20 DIAGNOSIS — M62830 Muscle spasm of back: Secondary | ICD-10-CM | POA: Diagnosis not present

## 2019-10-20 DIAGNOSIS — M545 Low back pain, unspecified: Secondary | ICD-10-CM

## 2019-10-20 NOTE — Therapy (Signed)
mammogram, encounter for 06/15/2012  . Prediabetes 06/03/2011  . HYPERTENSION, BENIGN ESSENTIAL 10/23/2010  . CONSTIPATION 10/23/2010  . HEMATOCHEZIA 10/23/2010  . OSTEOARTHRITIS, HANDS, BILATERAL 01/24/2010  . Hypothyroidism 08/24/2008  . Vitamin D deficiency 06/06/2008  . Hyperlipidemia 06/06/2008  . MENOPAUSAL SYNDROME 03/29/2008  . Osteopenia 03/29/2008  . ALLERGIC RHINITIS 01/25/2008  . IBS 01/25/2008  . OSTEOARTHRITIS 01/25/2008  . FIBROMYALGIA 01/25/2008    [name], PT 10/20/2019, 3:10 PM  [address] [place], [place], [address] Phone: [phone]   Fax:  [phone]  Name: Linda Mcgrath MRN: [id number] Date of Birth: May 03, 1933  [place], [place], [address] Phone: [phone]   Fax:  [phone]  Physical Therapy Evaluation  Patient Details  Name: Linda Mcgrath MRN: [id number] Date of Birth: 12-28-1932 Referring Provider (PT): [name]   Encounter Date: 10/20/2019  PT End of Session - 10/20/19 1457    Visit Number  1    Date for PT Re-Evaluation  12/18/19    PT Start Time  [id number]    PT Stop Time  1520    PT Time Calculation (min)  41 min    Activity Tolerance  Patient limited by pain    Behavior During Therapy  [facility] for tasks assessed/performed       Past Medical History:  Diagnosis Date  . Allergic rhinitis   . Asthma   . Cataract    Bil/lens implant  . Colon polyp 1999   small polyp  . Diverticulosis   . Dysrhythmia   . Fibromyalgia   . Hyperlipidemia   . Hypothyroidism   . Internal hemorrhoids   . Leg cramps   . Lung nodule    stable LUL 9 mm  . Menopausal syndrome   . Osteoarthritis   . UTI (urinary tract infection)     Past Surgical History:  Procedure Laterality Date  . Abd U/S  11/1998   negative  . Abd U/S  01/2001   gallbladder polyps  . ABDOMINAL HYSTERECTOMY  1975   total-fibroids  . APPENDECTOMY  1975  . BREAST BIOPSY     benign  . BREAST EXCISIONAL BIOPSY Left 1957  . CARDIOVERSION N/A 03/07/2016   Procedure: CARDIOVERSION;  Surgeon: Adrian Prows, MD;  Location: [facility];  Service: Cardiovascular;  Laterality: N/A;  . CATARACT EXTRACTION     bilateral  . CHOLECYSTECTOMY    . COLONOSCOPY  1998   Diverticulosis; polyp\  . COLONOSCOPY  09/2002   Diverticulosis, hem  . COLONOSCOPY  12/2007   diverticulosis, polyp  . DEXA  10/2001   osteopenia  . ELECTROPHYSIOLOGIC STUDY N/A 04/03/2016   Procedure: A-Flutter Ablation;  Surgeon: Will Meredith Leeds, MD;  Location: [facility];  Service: Cardiovascular;  Laterality: N/A;  . ESOPHAGOGASTRODUODENOSCOPY  2004  . Hida scan   01/2001   Negative  . KNEE SURGERY     right knee / 11/2004  . LAMINECTOMY WITH POSTERIOR LATERAL ARTHRODESIS LEVEL 2 N/A 07/31/2017   Procedure: DECOMPRESSIVE LAMINECTOMY LUMABR FOUR-FIVE, LUMBAR FIVE-SACRAL ONE;  Surgeon: Eustace Moore, MD;  Location: [facility];  Service: Neurosurgery;  Laterality: N/A;  . laser surgery for glaucoma Left Sep 21, 2014  . ROTATOR CUFF REPAIR     x 2 /right shoulder  . SKIN CANCER EXCISION     pre-melanoma / on face  . TEE WITHOUT CARDIOVERSION N/A 03/07/2016   Procedure: TRANSESOPHAGEAL ECHOCARDIOGRAM (TEE);  Surgeon: Adrian Prows, MD;  Location: [facility];  Service: Cardiovascular;  Laterality: N/A;  . TOE SURGERY     rt foot     There were no vitals filed for this visit.   Subjective Assessment - 10/20/19 1441    Subjective  Patient reports that about 2-3 weeks ago she was doing laundry and picked up a basket and twisted and hurt her back.  She reports that she was without power and had to go to the [place] and it was all new to her and then she got to the point where she was having dificulty walking.  She reports  [place], [place], [address] Phone: [phone]   Fax:  [phone]  Physical Therapy Evaluation  Patient Details  Name: Linda Mcgrath MRN: [id number] Date of Birth: 12-28-1932 Referring Provider (PT): [name]   Encounter Date: 10/20/2019  PT End of Session - 10/20/19 1457    Visit Number  1    Date for PT Re-Evaluation  12/18/19    PT Start Time  [id number]    PT Stop Time  1520    PT Time Calculation (min)  41 min    Activity Tolerance  Patient limited by pain    Behavior During Therapy  [facility] for tasks assessed/performed       Past Medical History:  Diagnosis Date  . Allergic rhinitis   . Asthma   . Cataract    Bil/lens implant  . Colon polyp 1999   small polyp  . Diverticulosis   . Dysrhythmia   . Fibromyalgia   . Hyperlipidemia   . Hypothyroidism   . Internal hemorrhoids   . Leg cramps   . Lung nodule    stable LUL 9 mm  . Menopausal syndrome   . Osteoarthritis   . UTI (urinary tract infection)     Past Surgical History:  Procedure Laterality Date  . Abd U/S  11/1998   negative  . Abd U/S  01/2001   gallbladder polyps  . ABDOMINAL HYSTERECTOMY  1975   total-fibroids  . APPENDECTOMY  1975  . BREAST BIOPSY     benign  . BREAST EXCISIONAL BIOPSY Left 1957  . CARDIOVERSION N/A 03/07/2016   Procedure: CARDIOVERSION;  Surgeon: Adrian Prows, MD;  Location: [facility];  Service: Cardiovascular;  Laterality: N/A;  . CATARACT EXTRACTION     bilateral  . CHOLECYSTECTOMY    . COLONOSCOPY  1998   Diverticulosis; polyp\  . COLONOSCOPY  09/2002   Diverticulosis, hem  . COLONOSCOPY  12/2007   diverticulosis, polyp  . DEXA  10/2001   osteopenia  . ELECTROPHYSIOLOGIC STUDY N/A 04/03/2016   Procedure: A-Flutter Ablation;  Surgeon: Will Meredith Leeds, MD;  Location: [facility];  Service: Cardiovascular;  Laterality: N/A;  . ESOPHAGOGASTRODUODENOSCOPY  2004  . Hida scan   01/2001   Negative  . KNEE SURGERY     right knee / 11/2004  . LAMINECTOMY WITH POSTERIOR LATERAL ARTHRODESIS LEVEL 2 N/A 07/31/2017   Procedure: DECOMPRESSIVE LAMINECTOMY LUMABR FOUR-FIVE, LUMBAR FIVE-SACRAL ONE;  Surgeon: Eustace Moore, MD;  Location: [facility];  Service: Neurosurgery;  Laterality: N/A;  . laser surgery for glaucoma Left Sep 21, 2014  . ROTATOR CUFF REPAIR     x 2 /right shoulder  . SKIN CANCER EXCISION     pre-melanoma / on face  . TEE WITHOUT CARDIOVERSION N/A 03/07/2016   Procedure: TRANSESOPHAGEAL ECHOCARDIOGRAM (TEE);  Surgeon: Adrian Prows, MD;  Location: [facility];  Service: Cardiovascular;  Laterality: N/A;  . TOE SURGERY     rt foot     There were no vitals filed for this visit.   Subjective Assessment - 10/20/19 1441    Subjective  Patient reports that about 2-3 weeks ago she was doing laundry and picked up a basket and twisted and hurt her back.  She reports that she was without power and had to go to the [place] and it was all new to her and then she got to the point where she was having dificulty walking.  She reports  mammogram, encounter for 06/15/2012  . Prediabetes 06/03/2011  . HYPERTENSION, BENIGN ESSENTIAL 10/23/2010  . CONSTIPATION 10/23/2010  . HEMATOCHEZIA 10/23/2010  . OSTEOARTHRITIS, HANDS, BILATERAL 01/24/2010  . Hypothyroidism 08/24/2008  . Vitamin D deficiency 06/06/2008  . Hyperlipidemia 06/06/2008  . MENOPAUSAL SYNDROME 03/29/2008  . Osteopenia 03/29/2008  . ALLERGIC RHINITIS 01/25/2008  . IBS 01/25/2008  . OSTEOARTHRITIS 01/25/2008  . FIBROMYALGIA 01/25/2008    [name], PT 10/20/2019, 3:10 PM  [address] [place], [place], [address] Phone: [phone]   Fax:  [phone]  Name: Linda Mcgrath MRN: [id number] Date of Birth: May 03, 1933

## 2019-10-27 ENCOUNTER — Encounter: Payer: Self-pay | Admitting: Physical Therapy

## 2019-10-27 ENCOUNTER — Ambulatory Visit: Payer: Medicare Other | Attending: Orthopaedic Surgery | Admitting: Physical Therapy

## 2019-10-27 ENCOUNTER — Other Ambulatory Visit: Payer: Self-pay

## 2019-10-27 DIAGNOSIS — M542 Cervicalgia: Secondary | ICD-10-CM | POA: Insufficient documentation

## 2019-10-27 DIAGNOSIS — M545 Low back pain, unspecified: Secondary | ICD-10-CM

## 2019-10-27 DIAGNOSIS — M62830 Muscle spasm of back: Secondary | ICD-10-CM

## 2019-10-27 DIAGNOSIS — R262 Difficulty in walking, not elsewhere classified: Secondary | ICD-10-CM | POA: Diagnosis not present

## 2019-10-27 NOTE — Therapy (Signed)
[facility] [address] [place], [place], [address] Phone: [phone]   Fax:  [phone]  Physical Therapy Treatment  Patient Details  Name: Linda Mcgrath MRN: [id number] Date of Birth: August 12, 1933 Referring Provider (PT): [name]   Encounter Date: 10/27/2019  PT End of Session - 10/27/19 1727    Visit Number  2    Date for PT Re-Evaluation  12/18/19    PT Start Time  1630    PT Stop Time  1735    PT Time Calculation (min)  65 min    Activity Tolerance  Patient limited by pain    Behavior During Therapy  [facility] for tasks assessed/performed       Past Medical History:  Diagnosis Date  . Allergic rhinitis   . Asthma   . Cataract    Bil/lens implant  . Colon polyp 1999   small polyp  . Diverticulosis   . Dysrhythmia   . Fibromyalgia   . Hyperlipidemia   . Hypothyroidism   . Internal hemorrhoids   . Leg cramps   . Lung nodule    stable LUL 9 mm  . Menopausal syndrome   . Osteoarthritis   . UTI (urinary tract infection)     Past Surgical History:  Procedure Laterality Date  . Abd U/S  11/1998   negative  . Abd U/S  01/2001   gallbladder polyps  . ABDOMINAL HYSTERECTOMY  1975   total-fibroids  . APPENDECTOMY  1975  . BREAST BIOPSY     benign  . BREAST EXCISIONAL BIOPSY Left 1957  . CARDIOVERSION N/A 03/07/2016   Procedure: CARDIOVERSION;  Surgeon: Yates Decamp, MD;  Location[facility] ENDOSCOPY;  Service: Cardiovascular;  Laterality: N/A;  . CATARACT EXTRACTION     bilateral  . CHOLECYSTECTOMY    . COLONOSCOPY  1998   Diverticulosis; polyp\  . COLONOSCOPY  09/2002   Diverticulosis, hem  . COLONOSCOPY  12/2007   diverticulosis, polyp  . DEXA  10/2001   osteopenia  . ELECTROPHYSIOLOGIC STUDY N/A 04/03/2016   Procedure: A-Flutter Ablation;  Surgeon: Will [name], MD;  [facility]: [facility];  Service: Cardiovascular;  Laterality: N/A;  . ESOPHAGOGASTRODUODENOSCOPY  2004  . Hida scan   01/2001   Negative  . KNEE SURGERY     right knee / 11/2004  . LAMINECTOMY WITH POSTERIOR LATERAL ARTHRODESIS LEVEL 2 N/A 07/31/2017   Procedure: DECOMPRESSIVE LAMINECTOMY LUMABR FOUR-FIVE, LUMBAR FIVE-SACRAL ONE;  Surgeon: Tia Alert, MD;  Location: [facility] OR;  Service: Neurosurgery;  Laterality: N/A;  . laser surgery for glaucoma Left Sep 21, 2014  . ROTATOR CUFF REPAIR     x 2 /right shoulder  . SKIN CANCER EXCISION     pre-melanoma / on face  . TEE WITHOUT CARDIOVERSION N/A 03/07/2016   Procedure: TRANSESOPHAGEAL ECHOCARDIOGRAM (TEE);  Surgeon: Yates Decamp, MD;  Location[facility];  Service: Cardiovascular;  Laterality: N/A;  . TOE SURGERY     rt foot     There were no vitals filed for this visit.  Subjective Assessment - 10/27/19 1639    Subjective  Patient reports that she is not sleeping, pain wakes her up.  She reports that the pain just really seems to be spasms.    Currently in Pain?  Yes    Pain Score  7     Pain Location  Back    Pain Orientation  Mid;Lower    Pain Descriptors / Indicators  Aching;Spasm    Aggravating Factors   trying to sleep                       [facility] Adult PT Treatment/Exercise - 10/27/19 0001      Exercises   Exercises  Lumbar      Lumbar Exercises: Aerobic   Nustep  level 3 x 6 minutes      Lumbar Exercises: Seated   Other Seated Lumbar Exercises  pelvic mobility and stability while on sit fit including 2.5# marches and LAQ, red tband 3 way scapular stabilization and isometric ball abdominals and ball b/n knees squeezes      Modalities   Modalities  Electrical Stimulation;Moist Heat      Moist Heat Therapy   Number Minutes Moist Heat  13 Minutes    Moist Heat Location  Lumbar Spine      Electrical Stimulation   Electrical Stimulation Location  lumbar spine    Electrical Stimulation Action  IFC    Electrical Stimulation Parameters  sitting    Electrical Stimulation Goals  Pain      Manual Therapy   Manual Therapy   Soft tissue mobilization    Soft tissue mobilization  bilateral lumbar and into the thoracic parapsinals               PT Short Term Goals - 10/27/19 1729      PT SHORT TERM GOAL #1   Title  independent with HEP    Status  On-going        PT Long Term Goals - 10/20/19 1508      PT LONG TERM GOAL #1   Title  decrease pain 50%    Time  8    Period  Weeks    Status  New      PT LONG TERM GOAL #2   Title  increase lumbar ROM 25%    Time  8    Period  Weeks    Status  New      PT LONG TERM GOAL #3   Title  Understand proper posture and body mechanics for housework and yardwork tasks.     Time  8    Period  Weeks    Status  New      PT LONG TERM GOAL #4   Title  tolerate shopping for groceries without an increase in pain    Time  8    Period  Weeks    Status  New      PT LONG TERM GOAL #5   Title  decrease TUG time to 17 seconds    Time  8    Period  Weeks    Status  New            Plan - 10/27/19 1728    Clinical Impression Statement  Patient continues to report high levels of pain, there area lot of spasms palpable in the lumbar and into the thoracic area.  She is very tender here.  Sh seemed to tolerate the exercises without much increase in pain, she just needed a lot of help with the pelvic mobility and stability with verbal and tactile cues       Patient will benefit from skilled therapeutic intervention in order to improve the following deficits and impairments:  Abnormal gait, Pain, Improper body mechanics, Postural dysfunction, Increased muscle spasms, Decreased activity tolerance, Decreased endurance, Decreased range of motion, Decreased strength, Difficulty walking, Impaired flexibility  Visit Diagnosis: Acute bilateral low back pain without sciatica  Muscle spasm of back  Cervicalgia  Difficulty in walking, not elsewhere classified     Problem List Patient Active Problem List   Diagnosis Date Noted  . Left low back pain  10/18/2019  . Positive depression screening 10/18/2019  . Osteoarthritis of right knee 08/02/2019  . Diarrhea 07/03/2018  . Dyspepsia 07/03/2018  . S/P lumbar spinal fusion 07/31/2017  . Right low back pain 03/04/2017  . H/O atrial flutter 03/06/2016  . Obesity 11/06/2015  . Urge incontinence 01/24/2015  . Estrogen deficiency 10/25/2014  . Lichen planus 12/20/2013  . Encounter for Medicare annual wellness exam 06/17/2013  . Screening mammogram, encounter for 06/15/2012  . Prediabetes 06/03/2011  . HYPERTENSION, BENIGN ESSENTIAL 10/23/2010  . CONSTIPATION 10/23/2010  . HEMATOCHEZIA 10/23/2010  . OSTEOARTHRITIS, HANDS, BILATERAL 01/24/2010  . Hypothyroidism 08/24/2008  . Vitamin D deficiency 06/06/2008  . Hyperlipidemia 06/06/2008  . MENOPAUSAL SYNDROME 03/29/2008  . Osteopenia 03/29/2008  . ALLERGIC RHINITIS 01/25/2008  . IBS 01/25/2008  . OSTEOARTHRITIS 01/25/2008  . FIBROMYALGIA 01/25/2008    Jearld Lesch., PT 10/27/2019, 5:30 PM  [facility] [address] [place], [place], [address] Phone: [phone]   Fax:  [phone]  Name: GERTRUD BELVIN MRN: [id number] Date of Birth: Aug 14, 1933

## 2019-11-02 ENCOUNTER — Other Ambulatory Visit: Payer: Self-pay

## 2019-11-02 ENCOUNTER — Ambulatory Visit: Payer: Medicare Other | Admitting: Physical Therapy

## 2019-11-02 ENCOUNTER — Encounter: Payer: Self-pay | Admitting: Physical Therapy

## 2019-11-02 DIAGNOSIS — R262 Difficulty in walking, not elsewhere classified: Secondary | ICD-10-CM

## 2019-11-02 DIAGNOSIS — M62830 Muscle spasm of back: Secondary | ICD-10-CM | POA: Diagnosis not present

## 2019-11-02 DIAGNOSIS — M542 Cervicalgia: Secondary | ICD-10-CM | POA: Diagnosis not present

## 2019-11-02 DIAGNOSIS — M545 Low back pain, unspecified: Secondary | ICD-10-CM

## 2019-11-02 NOTE — Therapy (Signed)
Increased muscle spasms, Decreased activity tolerance, Decreased endurance, Decreased range of motion, Decreased strength, Difficulty walking, Impaired flexibility  Visit Diagnosis: Acute bilateral low back pain without sciatica  Muscle spasm of back  Cervicalgia  Difficulty in walking, not elsewhere  classified     Problem List Patient Active Problem List   Diagnosis Date Noted  . Left low back pain 10/18/2019  . Positive depression screening 10/18/2019  . Osteoarthritis of right knee 08/02/2019  . Diarrhea 07/03/2018  . Dyspepsia 07/03/2018  . S/P lumbar spinal fusion 07/31/2017  . Right low back pain 03/04/2017  . H/O atrial flutter 03/06/2016  . Obesity 11/06/2015  . Urge incontinence 01/24/2015  . Estrogen deficiency 10/25/2014  . Lichen planus [id number]  . Encounter for Medicare annual wellness exam 06/17/2013  . Screening mammogram, encounter for 06/15/2012  . Prediabetes 06/03/2011  . HYPERTENSION, BENIGN ESSENTIAL 10/23/2010  . CONSTIPATION 10/23/2010  . HEMATOCHEZIA 10/23/2010  . OSTEOARTHRITIS, HANDS, BILATERAL 01/24/2010  . Hypothyroidism 08/24/2008  . Vitamin D deficiency 06/06/2008  . Hyperlipidemia 06/06/2008  . MENOPAUSAL SYNDROME 03/29/2008  . Osteopenia 03/29/2008  . ALLERGIC RHINITIS 01/25/2008  . IBS 01/25/2008  . OSTEOARTHRITIS 01/25/2008  . FIBROMYALGIA 01/25/2008    [name], PT 11/02/2019, 11:57 AM  [address] [place], [place], [address] Phone: [phone]   Fax:  [phone]  Name: Linda Mcgrath MRN: [id number] Date of Birth: 1932-10-07  [place], [place], [address] Phone: [phone]   Fax:  [phone]  Physical Therapy Treatment  Patient Details  Name: Linda Mcgrath MRN: [id number] Date of Birth: Dec 06, 1932 Referring Provider (PT): [name]   Encounter Date: 11/02/2019  PT End of Session - 11/02/19 1155    Visit Number  3    Date for PT Re-Evaluation  12/18/19    PT Start Time  1055    PT Stop Time  1155    PT Time Calculation (min)  60 min    Activity Tolerance  Patient limited by pain    Behavior During Therapy  [facility] for tasks assessed/performed       Past Medical History:  Diagnosis Date  . Allergic rhinitis   . Asthma   . Cataract    Bil/lens implant  . Colon polyp 1999   small polyp  . Diverticulosis   . Dysrhythmia   . Fibromyalgia   . Hyperlipidemia   . Hypothyroidism   . Internal hemorrhoids   . Leg cramps   . Lung nodule    stable LUL 9 mm  . Menopausal syndrome   . Osteoarthritis   . UTI (urinary tract infection)     Past Surgical History:  Procedure Laterality Date  . Abd U/S  11/1998   negative  . Abd U/S  01/2001   gallbladder polyps  . ABDOMINAL HYSTERECTOMY  1975   total-fibroids  . APPENDECTOMY  1975  . BREAST BIOPSY     benign  . BREAST EXCISIONAL BIOPSY Left 1957  . CARDIOVERSION N/A 03/07/2016   Procedure: CARDIOVERSION;  Surgeon: Adrian Prows, MD;  Location: [facility];  Service: Cardiovascular;  Laterality: N/A;  . CATARACT EXTRACTION     bilateral  . CHOLECYSTECTOMY    . COLONOSCOPY  1998   Diverticulosis; polyp\  . COLONOSCOPY  09/2002   Diverticulosis, hem  . COLONOSCOPY  12/2007   diverticulosis, polyp  . DEXA  10/2001   osteopenia  . ELECTROPHYSIOLOGIC STUDY N/A 04/03/2016   Procedure: A-Flutter Ablation;  Surgeon: Will Meredith Leeds, MD;  Location: [facility];  Service: Cardiovascular;  Laterality: N/A;  . ESOPHAGOGASTRODUODENOSCOPY  2004  . Hida scan   01/2001   Negative  . KNEE SURGERY     right knee / 11/2004  . LAMINECTOMY WITH POSTERIOR LATERAL ARTHRODESIS LEVEL 2 N/A 07/31/2017   Procedure: DECOMPRESSIVE LAMINECTOMY LUMABR FOUR-FIVE, LUMBAR FIVE-SACRAL ONE;  Surgeon: Eustace Moore, MD;  Location: [facility];  Service: Neurosurgery;  Laterality: N/A;  . laser surgery for glaucoma Left Sep 21, 2014  . ROTATOR CUFF REPAIR     x 2 /right shoulder  . SKIN CANCER EXCISION     pre-melanoma / on face  . TEE WITHOUT CARDIOVERSION N/A 03/07/2016   Procedure: TRANSESOPHAGEAL ECHOCARDIOGRAM (TEE);  Surgeon: Adrian Prows, MD;  Location: [facility];  Service: Cardiovascular;  Laterality: N/A;  . TOE SURGERY     rt foot     There were no vitals filed for this visit.  Subjective Assessment - 11/02/19 1104    Subjective  Patient reports that she felt a lot better after the treatment, but has really had increased pain over the weekend, did a little bit of picking up sticks in the yard    Currently in Pain?  Yes    Pain Score  7     Pain Location  Back    Pain Orientation  Mid;Lower  Increased muscle spasms, Decreased activity tolerance, Decreased endurance, Decreased range of motion, Decreased strength, Difficulty walking, Impaired flexibility  Visit Diagnosis: Acute bilateral low back pain without sciatica  Muscle spasm of back  Cervicalgia  Difficulty in walking, not elsewhere  classified     Problem List Patient Active Problem List   Diagnosis Date Noted  . Left low back pain 10/18/2019  . Positive depression screening 10/18/2019  . Osteoarthritis of right knee 08/02/2019  . Diarrhea 07/03/2018  . Dyspepsia 07/03/2018  . S/P lumbar spinal fusion 07/31/2017  . Right low back pain 03/04/2017  . H/O atrial flutter 03/06/2016  . Obesity 11/06/2015  . Urge incontinence 01/24/2015  . Estrogen deficiency 10/25/2014  . Lichen planus [id number]  . Encounter for Medicare annual wellness exam 06/17/2013  . Screening mammogram, encounter for 06/15/2012  . Prediabetes 06/03/2011  . HYPERTENSION, BENIGN ESSENTIAL 10/23/2010  . CONSTIPATION 10/23/2010  . HEMATOCHEZIA 10/23/2010  . OSTEOARTHRITIS, HANDS, BILATERAL 01/24/2010  . Hypothyroidism 08/24/2008  . Vitamin D deficiency 06/06/2008  . Hyperlipidemia 06/06/2008  . MENOPAUSAL SYNDROME 03/29/2008  . Osteopenia 03/29/2008  . ALLERGIC RHINITIS 01/25/2008  . IBS 01/25/2008  . OSTEOARTHRITIS 01/25/2008  . FIBROMYALGIA 01/25/2008    [name], PT 11/02/2019, 11:57 AM  [address] [place], [place], [address] Phone: [phone]   Fax:  [phone]  Name: Linda Mcgrath MRN: [id number] Date of Birth: 1932-10-07

## 2019-11-04 ENCOUNTER — Other Ambulatory Visit: Payer: Self-pay

## 2019-11-04 ENCOUNTER — Encounter: Payer: Self-pay | Admitting: Physical Therapy

## 2019-11-04 ENCOUNTER — Ambulatory Visit: Payer: Medicare Other | Admitting: Physical Therapy

## 2019-11-04 DIAGNOSIS — M545 Low back pain, unspecified: Secondary | ICD-10-CM

## 2019-11-04 DIAGNOSIS — M542 Cervicalgia: Secondary | ICD-10-CM | POA: Diagnosis not present

## 2019-11-04 DIAGNOSIS — M62830 Muscle spasm of back: Secondary | ICD-10-CM | POA: Diagnosis not present

## 2019-11-04 DIAGNOSIS — R262 Difficulty in walking, not elsewhere classified: Secondary | ICD-10-CM

## 2019-11-04 NOTE — Therapy (Signed)
[facility] [address] [place], [place], [address] Phone: [phone]   Fax:  [phone]  Physical Therapy Treatment  Patient Details  Name: Linda Mcgrath MRN: [id number] Date of Birth: Aug 15, 1933 Referring Provider (PT): [name]   Encounter Date: 11/04/2019  PT End of Session - 11/04/19 1133    Visit Number  4    Date for PT Re-Evaluation  12/18/19    PT Start Time  1051    PT Stop Time  1147    PT Time Calculation (min)  56 min    Activity Tolerance  Patient limited by pain    Behavior During Therapy  [facility] for tasks assessed/performed       Past Medical History:  Diagnosis Date  . Allergic rhinitis   . Asthma   . Cataract    Bil/lens implant  . Colon polyp 1999   small polyp  . Diverticulosis   . Dysrhythmia   . Fibromyalgia   . Hyperlipidemia   . Hypothyroidism   . Internal hemorrhoids   . Leg cramps   . Lung nodule    stable LUL 9 mm  . Menopausal syndrome   . Osteoarthritis   . UTI (urinary tract infection)     Past Surgical History:  Procedure Laterality Date  . Abd U/S  11/1998   negative  . Abd U/S  01/2001   gallbladder polyps  . ABDOMINAL HYSTERECTOMY  1975   total-fibroids  . APPENDECTOMY  1975  . BREAST BIOPSY     benign  . BREAST EXCISIONAL BIOPSY Left 1957  . CARDIOVERSION N/A 03/07/2016   Procedure: CARDIOVERSION;  Surgeon: Yates Decamp, MD;  Location: [facility] ENDOSCOPY;  Service: Cardiovascular;  Laterality: N/A;  . CATARACT EXTRACTION     bilateral  . CHOLECYSTECTOMY    . COLONOSCOPY  1998   Diverticulosis; polyp\  . COLONOSCOPY  09/2002   Diverticulosis, hem  . COLONOSCOPY  12/2007   diverticulosis, polyp  . DEXA  10/2001   osteopenia  . ELECTROPHYSIOLOGIC STUDY N/A 04/03/2016   Procedure: A-Flutter Ablation;  Surgeon: Will [name], MD;  [facility] INVASIVE CV LAB;  Service: Cardiovascular;  Laterality: N/A;  . ESOPHAGOGASTRODUODENOSCOPY  2004  . Hida scan   01/2001   Negative  . KNEE SURGERY     right knee / 11/2004  . LAMINECTOMY WITH POSTERIOR LATERAL ARTHRODESIS LEVEL 2 N/A 07/31/2017   Procedure: DECOMPRESSIVE LAMINECTOMY LUMABR FOUR-FIVE, LUMBAR FIVE-SACRAL ONE;  Surgeon: Tia Alert, MD;  Location: [facility] OR;  Service: Neurosurgery;  Laterality: N/A;  . laser surgery for glaucoma Left Sep 21, 2014  . ROTATOR CUFF REPAIR     x 2 /right shoulder  . SKIN CANCER EXCISION     pre-melanoma / on face  . TEE WITHOUT CARDIOVERSION N/A 03/07/2016   Procedure: TRANSESOPHAGEAL ECHOCARDIOGRAM (TEE);  Surgeon: Yates Decamp, MD;  Location: [facility] ENDOSCOPY;  Service: Cardiovascular;  Laterality: N/A;  . TOE SURGERY     rt foot     There were no vitals filed for this visit.  Subjective Assessment - 11/04/19 1100    Subjective  Patient reports that she is about the same today, still hurting                       [facility] Adult PT Treatment/Exercise - 11/04/19 0001      Lumbar Exercises: Aerobic   Nustep  level 3 x 6 minutes  Lumbar Exercises: Seated   Other Seated Lumbar Exercises  pelvic mobility and stability while on sit fit including 2.5# marches and LAQ, red tband 3 way scapular stabilization and isometric ball abdominals and ball b/n knees squeezes      Modalities   Modalities  Electrical Stimulation;Moist Heat      Moist Heat Therapy   Number Minutes Moist Heat  15 Minutes    Moist Heat Location  Lumbar Spine      Electrical Stimulation   Electrical Stimulation Location  lumbar spine    Electrical Stimulation Action  IFC    Electrical Stimulation Parameters  sitting    Electrical Stimulation Goals  Pain      Manual Therapy   Manual Therapy  Soft tissue mobilization    Soft tissue mobilization  bilateral lumbar and into the thoracic parapsinals               PT Short Term Goals - 11/04/19 1136      PT SHORT TERM GOAL #1   Title  independent with HEP    Status  Partially Met        PT Long Term Goals -  11/04/19 1136      PT LONG TERM GOAL #1   Title  decrease pain 50%    Status  On-going            Plan - 11/04/19 1134    Clinical Impression Statement  Patietn reports that she is still really hurting.  I tried some STM to the lumbar area.  She is pretty tender with spasms palpable.  She was moving slower today but reports that she has an injury to her lower right leg, The dog jumped on me    PT Next Visit Plan  continue to add exercises as tolerated    Consulted and Agree with Plan of Care  Patient       Patient will benefit from skilled therapeutic intervention in order to improve the following deficits and impairments:  Abnormal gait, Pain, Improper body mechanics, Postural dysfunction, Increased muscle spasms, Decreased activity tolerance, Decreased endurance, Decreased range of motion, Decreased strength, Difficulty walking, Impaired flexibility  Visit Diagnosis: Acute bilateral low back pain without sciatica  Muscle spasm of back  Cervicalgia  Difficulty in walking, not elsewhere classified     Problem List Patient Active Problem List   Diagnosis Date Noted  . Left low back pain 10/18/2019  . Positive depression screening 10/18/2019  . Osteoarthritis of right knee 08/02/2019  . Diarrhea 07/03/2018  . Dyspepsia 07/03/2018  . S/P lumbar spinal fusion 07/31/2017  . Right low back pain 03/04/2017  . H/O atrial flutter 03/06/2016  . Obesity 11/06/2015  . Urge incontinence 01/24/2015  . Estrogen deficiency 10/25/2014  . Lichen planus 12/20/2013  . Encounter for Medicare annual wellness exam 06/17/2013  . Screening mammogram, encounter for 06/15/2012  . Prediabetes 06/03/2011  . HYPERTENSION, BENIGN ESSENTIAL 10/23/2010  . CONSTIPATION 10/23/2010  . HEMATOCHEZIA 10/23/2010  . OSTEOARTHRITIS, HANDS, BILATERAL 01/24/2010  . Hypothyroidism 08/24/2008  . Vitamin D deficiency 06/06/2008  . Hyperlipidemia 06/06/2008  . MENOPAUSAL SYNDROME 03/29/2008  . Osteopenia  03/29/2008  . ALLERGIC RHINITIS 01/25/2008  . IBS 01/25/2008  . OSTEOARTHRITIS 01/25/2008  . FIBROMYALGIA 01/25/2008    Jearld Lesch., PT 11/04/2019, 11:37 AM  [facility] [address] [place], [place], [address] Phone: [phone]   Fax:  [phone]  Name: Linda Mcgrath MRN:  [id number] Date of Birth: [birth year]

## 2019-11-09 ENCOUNTER — Encounter: Payer: Self-pay | Admitting: Physical Therapy

## 2019-11-09 ENCOUNTER — Other Ambulatory Visit: Payer: Self-pay

## 2019-11-09 ENCOUNTER — Ambulatory Visit: Payer: Medicare Other | Admitting: Physical Therapy

## 2019-11-09 DIAGNOSIS — M545 Low back pain, unspecified: Secondary | ICD-10-CM

## 2019-11-09 DIAGNOSIS — M62830 Muscle spasm of back: Secondary | ICD-10-CM

## 2019-11-09 DIAGNOSIS — M542 Cervicalgia: Secondary | ICD-10-CM | POA: Diagnosis not present

## 2019-11-09 DIAGNOSIS — R262 Difficulty in walking, not elsewhere classified: Secondary | ICD-10-CM

## 2019-11-09 NOTE — Therapy (Signed)
[place], [place], [address] Phone: [phone]   Fax:  [phone]  Physical Therapy Treatment  Patient Details  Name: Linda Mcgrath MRN: [id number] Date of Birth: Dec 02, 1932 Referring Provider (PT): [name]   Encounter Date: 11/09/2019  PT End of Session - 11/09/19 1147    Visit Number  5    Date for PT Re-Evaluation  12/18/19    PT Start Time  1050    PT Stop Time  1143    PT Time Calculation (min)  53 min    Activity Tolerance  Patient limited by pain    Behavior During Therapy  [facility] for tasks assessed/performed       Past Medical History:  Diagnosis Date  . Allergic rhinitis   . Asthma   . Cataract    Bil/lens implant  . Colon polyp 1999   small polyp  . Diverticulosis   . Dysrhythmia   . Fibromyalgia   . Hyperlipidemia   . Hypothyroidism   . Internal hemorrhoids   . Leg cramps   . Lung nodule    stable LUL 9 mm  . Menopausal syndrome   . Osteoarthritis   . UTI (urinary tract infection)     Past Surgical History:  Procedure Laterality Date  . Abd U/S  11/1998   negative  . Abd U/S  01/2001   gallbladder polyps  . ABDOMINAL HYSTERECTOMY  1975   total-fibroids  . APPENDECTOMY  1975  . BREAST BIOPSY     benign  . BREAST EXCISIONAL BIOPSY Left 1957  . CARDIOVERSION N/A 03/07/2016   Procedure: CARDIOVERSION;  Surgeon: Adrian Prows, MD;  Location: [place];  Service: Cardiovascular;  Laterality: N/A;  . CATARACT EXTRACTION     bilateral  . CHOLECYSTECTOMY    . COLONOSCOPY  1998   Diverticulosis; polyp\  . COLONOSCOPY  09/2002   Diverticulosis, hem  . COLONOSCOPY  12/2007   diverticulosis, polyp  . DEXA  10/2001   osteopenia  . ELECTROPHYSIOLOGIC STUDY N/A 04/03/2016   Procedure: A-Flutter Ablation;  Surgeon: Will Meredith Leeds, MD;  Location: [place] CV LAB;  Service: Cardiovascular;  Laterality: N/A;  . ESOPHAGOGASTRODUODENOSCOPY  2004  . Hida scan   01/2001   Negative  . KNEE SURGERY     right knee / 11/2004  . LAMINECTOMY WITH POSTERIOR LATERAL ARTHRODESIS LEVEL 2 N/A 07/31/2017   Procedure: DECOMPRESSIVE LAMINECTOMY LUMABR FOUR-FIVE, LUMBAR FIVE-SACRAL ONE;  Surgeon: Eustace Moore, MD;  Location: [place];  Service: Neurosurgery;  Laterality: N/A;  . laser surgery for glaucoma Left Sep 21, 2014  . ROTATOR CUFF REPAIR     x 2 /right shoulder  . SKIN CANCER EXCISION     pre-melanoma / on face  . TEE WITHOUT CARDIOVERSION N/A 03/07/2016   Procedure: TRANSESOPHAGEAL ECHOCARDIOGRAM (TEE);  Surgeon: Adrian Prows, MD;  Location: [place];  Service: Cardiovascular;  Laterality: N/A;  . TOE SURGERY     rt foot     There were no vitals filed for this visit.  Subjective Assessment - 11/09/19 1050    Subjective  Patient reports that she was doing a little better, reports that she had to help someone move a lawn mower and that caused some increase of back pain.    Currently in Pain?  Yes    Pain Score  6     Pain Location  Back    Pain Orientation  Lower  CONSTIPATION 10/23/2010  . HEMATOCHEZIA 10/23/2010  . OSTEOARTHRITIS, HANDS, BILATERAL 01/24/2010  . Hypothyroidism 08/24/2008  . Vitamin D deficiency 06/06/2008  . Hyperlipidemia 06/06/2008  . MENOPAUSAL SYNDROME 03/29/2008  . Osteopenia 03/29/2008  . ALLERGIC RHINITIS 01/25/2008  . IBS 01/25/2008  . OSTEOARTHRITIS 01/25/2008  . FIBROMYALGIA 01/25/2008    [name], PT 11/09/2019, 11:50 AM  [address] [place], [place], [address] Phone: [phone]   Fax:  [phone]  Name: Linda Mcgrath MRN: [id number] Date of Birth: 09/29/1932  [place], [place], [address] Phone: [phone]   Fax:  [phone]  Physical Therapy Treatment  Patient Details  Name: Linda Mcgrath MRN: [id number] Date of Birth: Dec 02, 1932 Referring Provider (PT): [name]   Encounter Date: 11/09/2019  PT End of Session - 11/09/19 1147    Visit Number  5    Date for PT Re-Evaluation  12/18/19    PT Start Time  1050    PT Stop Time  1143    PT Time Calculation (min)  53 min    Activity Tolerance  Patient limited by pain    Behavior During Therapy  [facility] for tasks assessed/performed       Past Medical History:  Diagnosis Date  . Allergic rhinitis   . Asthma   . Cataract    Bil/lens implant  . Colon polyp 1999   small polyp  . Diverticulosis   . Dysrhythmia   . Fibromyalgia   . Hyperlipidemia   . Hypothyroidism   . Internal hemorrhoids   . Leg cramps   . Lung nodule    stable LUL 9 mm  . Menopausal syndrome   . Osteoarthritis   . UTI (urinary tract infection)     Past Surgical History:  Procedure Laterality Date  . Abd U/S  11/1998   negative  . Abd U/S  01/2001   gallbladder polyps  . ABDOMINAL HYSTERECTOMY  1975   total-fibroids  . APPENDECTOMY  1975  . BREAST BIOPSY     benign  . BREAST EXCISIONAL BIOPSY Left 1957  . CARDIOVERSION N/A 03/07/2016   Procedure: CARDIOVERSION;  Surgeon: Adrian Prows, MD;  Location: [facility];  Service: Cardiovascular;  Laterality: N/A;  . CATARACT EXTRACTION     bilateral  . CHOLECYSTECTOMY    . COLONOSCOPY  1998   Diverticulosis; polyp\  . COLONOSCOPY  09/2002   Diverticulosis, hem  . COLONOSCOPY  12/2007   diverticulosis, polyp  . DEXA  10/2001   osteopenia  . ELECTROPHYSIOLOGIC STUDY N/A 04/03/2016   Procedure: A-Flutter Ablation;  Surgeon: Will Meredith Leeds, MD;  Location: [place] CV LAB;  Service: Cardiovascular;  Laterality: N/A;  . ESOPHAGOGASTRODUODENOSCOPY  2004  . Hida scan   01/2001   Negative  . KNEE SURGERY     right knee / 11/2004  . LAMINECTOMY WITH POSTERIOR LATERAL ARTHRODESIS LEVEL 2 N/A 07/31/2017   Procedure: DECOMPRESSIVE LAMINECTOMY LUMABR FOUR-FIVE, LUMBAR FIVE-SACRAL ONE;  Surgeon: Eustace Moore, MD;  Location: [facility];  Service: Neurosurgery;  Laterality: N/A;  . laser surgery for glaucoma Left Sep 21, 2014  . ROTATOR CUFF REPAIR     x 2 /right shoulder  . SKIN CANCER EXCISION     pre-melanoma / on face  . TEE WITHOUT CARDIOVERSION N/A 03/07/2016   Procedure: TRANSESOPHAGEAL ECHOCARDIOGRAM (TEE);  Surgeon: Adrian Prows, MD;  Location: [facility];  Service: Cardiovascular;  Laterality: N/A;  . TOE SURGERY     rt foot     There were no vitals filed for this visit.  Subjective Assessment - 11/09/19 1050    Subjective  Patient reports that she was doing a little better, reports that she had to help someone move a lawn mower and that caused some increase of back pain.    Currently in Pain?  Yes    Pain Score  6     Pain Location  Back    Pain Orientation  Lower

## 2019-11-11 ENCOUNTER — Ambulatory Visit: Payer: Medicare Other | Admitting: Physical Therapy

## 2019-11-11 ENCOUNTER — Other Ambulatory Visit: Payer: Self-pay

## 2019-11-11 ENCOUNTER — Encounter: Payer: Self-pay | Admitting: Physical Therapy

## 2019-11-11 DIAGNOSIS — M545 Low back pain, unspecified: Secondary | ICD-10-CM

## 2019-11-11 DIAGNOSIS — M542 Cervicalgia: Secondary | ICD-10-CM

## 2019-11-11 DIAGNOSIS — M62830 Muscle spasm of back: Secondary | ICD-10-CM

## 2019-11-11 DIAGNOSIS — R262 Difficulty in walking, not elsewhere classified: Secondary | ICD-10-CM | POA: Diagnosis not present

## 2019-11-11 NOTE — Therapy (Signed)
CONSTIPATION 10/23/2010  . HEMATOCHEZIA 10/23/2010  . OSTEOARTHRITIS, HANDS, BILATERAL 01/24/2010  . Hypothyroidism 08/24/2008  . Vitamin D deficiency 06/06/2008  . Hyperlipidemia 06/06/2008  . MENOPAUSAL SYNDROME 03/29/2008  . Osteopenia 03/29/2008  . ALLERGIC RHINITIS 01/25/2008  . IBS 01/25/2008  . OSTEOARTHRITIS 01/25/2008  . FIBROMYALGIA 01/25/2008    [name], PT 11/11/2019, 11:53 AM  [address] [place], [place], [address] Phone: [phone]   Fax:  [phone]  Name: Linda Mcgrath MRN: [id number] Date of Birth: 1933-01-22  CONSTIPATION 10/23/2010  . HEMATOCHEZIA 10/23/2010  . OSTEOARTHRITIS, HANDS, BILATERAL 01/24/2010  . Hypothyroidism 08/24/2008  . Vitamin D deficiency 06/06/2008  . Hyperlipidemia 06/06/2008  . MENOPAUSAL SYNDROME 03/29/2008  . Osteopenia 03/29/2008  . ALLERGIC RHINITIS 01/25/2008  . IBS 01/25/2008  . OSTEOARTHRITIS 01/25/2008  . FIBROMYALGIA 01/25/2008    [name], PT 11/11/2019, 11:53 AM  [address] [place], [place], [address] Phone: [phone]   Fax:  [phone]  Name: Linda Mcgrath MRN: [id number] Date of Birth: 1933-01-22  [place], [place], [address] Phone: [phone]   Fax:  [phone]  Physical Therapy Treatment  Patient Details  Name: Linda Mcgrath MRN: [id number] Date of Birth: Jan 20, 1933 Referring Provider (PT): [name]   Encounter Date: 11/11/2019  PT End of Session - 11/11/19 1146    Visit Number  6    Date for PT Re-Evaluation  12/18/19    PT Start Time  1055    PT Stop Time  1146    PT Time Calculation (min)  51 min    Activity Tolerance  Patient limited by pain    Behavior During Therapy  [facility] for tasks assessed/performed       Past Medical History:  Diagnosis Date  . Allergic rhinitis   . Asthma   . Cataract    Bil/lens implant  . Colon polyp 1999   small polyp  . Diverticulosis   . Dysrhythmia   . Fibromyalgia   . Hyperlipidemia   . Hypothyroidism   . Internal hemorrhoids   . Leg cramps   . Lung nodule    stable LUL 9 mm  . Menopausal syndrome   . Osteoarthritis   . UTI (urinary tract infection)     Past Surgical History:  Procedure Laterality Date  . Abd U/S  11/1998   negative  . Abd U/S  01/2001   gallbladder polyps  . ABDOMINAL HYSTERECTOMY  1975   total-fibroids  . APPENDECTOMY  1975  . BREAST BIOPSY     benign  . BREAST EXCISIONAL BIOPSY Left 1957  . CARDIOVERSION N/A 03/07/2016   Procedure: CARDIOVERSION;  Surgeon: Adrian Prows, MD;  Location: [facility];  Service: Cardiovascular;  Laterality: N/A;  . CATARACT EXTRACTION     bilateral  . CHOLECYSTECTOMY    . COLONOSCOPY  1998   Diverticulosis; polyp\  . COLONOSCOPY  09/2002   Diverticulosis, hem  . COLONOSCOPY  12/2007   diverticulosis, polyp  . DEXA  10/2001   osteopenia  . ELECTROPHYSIOLOGIC STUDY N/A 04/03/2016   Procedure: A-Flutter Ablation;  Surgeon: Will Meredith Leeds, MD;  Location: [place] CV LAB;  Service: Cardiovascular;  Laterality: N/A;  . ESOPHAGOGASTRODUODENOSCOPY  2004  . Hida scan   01/2001   Negative  . KNEE SURGERY     right knee / 11/2004  . LAMINECTOMY WITH POSTERIOR LATERAL ARTHRODESIS LEVEL 2 N/A 07/31/2017   Procedure: DECOMPRESSIVE LAMINECTOMY LUMABR FOUR-FIVE, LUMBAR FIVE-SACRAL ONE;  Surgeon: Eustace Moore, MD;  Location: [place];  Service: Neurosurgery;  Laterality: N/A;  . laser surgery for glaucoma Left Sep 21, 2014  . ROTATOR CUFF REPAIR     x 2 /right shoulder  . SKIN CANCER EXCISION     pre-melanoma / on face  . TEE WITHOUT CARDIOVERSION N/A 03/07/2016   Procedure: TRANSESOPHAGEAL ECHOCARDIOGRAM (TEE);  Surgeon: Adrian Prows, MD;  Location: [facility];  Service: Cardiovascular;  Laterality: N/A;  . TOE SURGERY     rt foot     There were no vitals filed for this visit.  Subjective Assessment - 11/11/19 1117    Subjective  I am still a little better, some hand pain from pulling on the theraband    Currently in Pain?  Yes    Pain Score  5     Pain Location  Back    Pain Relieving Factors  treatment helps

## 2019-11-16 ENCOUNTER — Other Ambulatory Visit: Payer: Self-pay

## 2019-11-16 ENCOUNTER — Ambulatory Visit: Payer: Medicare Other | Admitting: Physical Therapy

## 2019-11-16 ENCOUNTER — Encounter: Payer: Self-pay | Admitting: Physical Therapy

## 2019-11-16 DIAGNOSIS — M542 Cervicalgia: Secondary | ICD-10-CM

## 2019-11-16 DIAGNOSIS — M545 Low back pain, unspecified: Secondary | ICD-10-CM

## 2019-11-16 DIAGNOSIS — M62830 Muscle spasm of back: Secondary | ICD-10-CM | POA: Diagnosis not present

## 2019-11-16 DIAGNOSIS — R262 Difficulty in walking, not elsewhere classified: Secondary | ICD-10-CM

## 2019-11-16 NOTE — Therapy (Signed)
S/P lumbar spinal fusion 07/31/2017  . Right low back pain 03/04/2017  . H/O atrial flutter 03/06/2016  . Obesity 11/06/2015  . Urge incontinence 01/24/2015  . Estrogen deficiency 10/25/2014  . Lichen planus [id number]  . Encounter for Medicare annual wellness exam 06/17/2013  . Screening mammogram, encounter for 06/15/2012  . Prediabetes 06/03/2011  . HYPERTENSION, BENIGN ESSENTIAL 10/23/2010  . CONSTIPATION 10/23/2010  . HEMATOCHEZIA 10/23/2010  . OSTEOARTHRITIS, HANDS, BILATERAL 01/24/2010  . Hypothyroidism 08/24/2008  . Vitamin D deficiency 06/06/2008  . Hyperlipidemia 06/06/2008  . MENOPAUSAL SYNDROME 03/29/2008  . Osteopenia 03/29/2008  . ALLERGIC RHINITIS 01/25/2008  . IBS 01/25/2008  . OSTEOARTHRITIS 01/25/2008  . FIBROMYALGIA 01/25/2008    [name], PT 11/16/2019, 11:33 AM  [place] [address] [place], [place], [address] Phone: [phone]   Fax:  [phone]  Name: Linda Mcgrath MRN: [id number] Date of Birth: 08-31-1932  [place], [place], [address] Phone: [phone]   Fax:  [phone]  Physical Therapy Treatment  Patient Details  Name: Linda Mcgrath MRN: [id number] Date of Birth: 02-06-1933 Referring Provider (PT): [name]   Encounter Date: 11/16/2019  PT End of Session - 11/16/19 1129    Visit Number  7    Date for PT Re-Evaluation  12/18/19    PT Start Time  1053    PT Stop Time  1143    PT Time Calculation (min)  50 min    Activity Tolerance  Patient limited by pain    Behavior During Therapy  [facility] for tasks assessed/performed       Past Medical History:  Diagnosis Date  . Allergic rhinitis   . Asthma   . Cataract    Bil/lens implant  . Colon polyp 1999   small polyp  . Diverticulosis   . Dysrhythmia   . Fibromyalgia   . Hyperlipidemia   . Hypothyroidism   . Internal hemorrhoids   . Leg cramps   . Lung nodule    stable LUL 9 mm  . Menopausal syndrome   . Osteoarthritis   . UTI (urinary tract infection)     Past Surgical History:  Procedure Laterality Date  . Abd U/S  11/1998   negative  . Abd U/S  01/2001   gallbladder polyps  . ABDOMINAL HYSTERECTOMY  1975   total-fibroids  . APPENDECTOMY  1975  . BREAST BIOPSY     benign  . BREAST EXCISIONAL BIOPSY Left 1957  . CARDIOVERSION N/A 03/07/2016   Procedure: CARDIOVERSION;  Surgeon: Adrian Prows, MD;  Location: [place];  Service: Cardiovascular;  Laterality: N/A;  . CATARACT EXTRACTION     bilateral  . CHOLECYSTECTOMY    . COLONOSCOPY  1998   Diverticulosis; polyp\  . COLONOSCOPY  09/2002   Diverticulosis, hem  . COLONOSCOPY  12/2007   diverticulosis, polyp  . DEXA  10/2001   osteopenia  . ELECTROPHYSIOLOGIC STUDY N/A 04/03/2016   Procedure: A-Flutter Ablation;  Surgeon: Will Meredith Leeds, MD;  Location: [place] CV LAB;  Service: Cardiovascular;  Laterality: N/A;  . ESOPHAGOGASTRODUODENOSCOPY  2004  . Hida scan   01/2001   Negative  . KNEE SURGERY     right knee / 11/2004  . LAMINECTOMY WITH POSTERIOR LATERAL ARTHRODESIS LEVEL 2 N/A 07/31/2017   Procedure: DECOMPRESSIVE LAMINECTOMY LUMABR FOUR-FIVE, LUMBAR FIVE-SACRAL ONE;  Surgeon: Eustace Moore, MD;  Location: [place];  Service: Neurosurgery;  Laterality: N/A;  . laser surgery for glaucoma Left Sep 21, 2014  . ROTATOR CUFF REPAIR     x 2 /right shoulder  . SKIN CANCER EXCISION     pre-melanoma / on face  . TEE WITHOUT CARDIOVERSION N/A 03/07/2016   Procedure: TRANSESOPHAGEAL ECHOCARDIOGRAM (TEE);  Surgeon: Adrian Prows, MD;  Location: [place];  Service: Cardiovascular;  Laterality: N/A;  . TOE SURGERY     rt foot     There were no vitals filed for this visit.  Subjective Assessment - 11/16/19 1057    Subjective  Patient reports that she has been sick over the weekend with a stomach issue, has not felt well and has not done anything    Currently in Pain?  Yes    Pain Score  4     Pain Location  Back    Pain Orientation  Lower;Mid    Pain Descriptors / Indicators  S/P lumbar spinal fusion 07/31/2017  . Right low back pain 03/04/2017  . H/O atrial flutter 03/06/2016  . Obesity 11/06/2015  . Urge incontinence 01/24/2015  . Estrogen deficiency 10/25/2014  . Lichen planus [id number]  . Encounter for Medicare annual wellness exam 06/17/2013  . Screening mammogram, encounter for 06/15/2012  . Prediabetes 06/03/2011  . HYPERTENSION, BENIGN ESSENTIAL 10/23/2010  . CONSTIPATION 10/23/2010  . HEMATOCHEZIA 10/23/2010  . OSTEOARTHRITIS, HANDS, BILATERAL 01/24/2010  . Hypothyroidism 08/24/2008  . Vitamin D deficiency 06/06/2008  . Hyperlipidemia 06/06/2008  . MENOPAUSAL SYNDROME 03/29/2008  . Osteopenia 03/29/2008  . ALLERGIC RHINITIS 01/25/2008  . IBS 01/25/2008  . OSTEOARTHRITIS 01/25/2008  . FIBROMYALGIA 01/25/2008    [name], PT 11/16/2019, 11:33 AM  [place] [address] [place], [place], [address] Phone: [phone]   Fax:  [phone]  Name: Linda Mcgrath MRN: [id number] Date of Birth: 08-31-1932

## 2019-11-18 ENCOUNTER — Encounter: Payer: Self-pay | Admitting: Physical Therapy

## 2019-11-18 ENCOUNTER — Other Ambulatory Visit: Payer: Self-pay

## 2019-11-18 ENCOUNTER — Ambulatory Visit: Payer: Medicare Other | Admitting: Physical Therapy

## 2019-11-18 DIAGNOSIS — R262 Difficulty in walking, not elsewhere classified: Secondary | ICD-10-CM

## 2019-11-18 DIAGNOSIS — M542 Cervicalgia: Secondary | ICD-10-CM | POA: Diagnosis not present

## 2019-11-18 DIAGNOSIS — M545 Low back pain, unspecified: Secondary | ICD-10-CM

## 2019-11-18 DIAGNOSIS — M62830 Muscle spasm of back: Secondary | ICD-10-CM

## 2019-11-18 NOTE — Therapy (Signed)
Estrogen deficiency 10/25/2014  . Lichen planus [id number]/42/6834  . Encounter for Medicare annual wellness exam 06/17/2013  . Screening mammogram, encounter for 06/15/2012  . Prediabetes 06/03/2011  . HYPERTENSION, BENIGN ESSENTIAL 10/23/2010  . CONSTIPATION 10/23/2010  . HEMATOCHEZIA 10/23/2010  . OSTEOARTHRITIS, HANDS, BILATERAL 01/24/2010  . Hypothyroidism 08/24/2008  . Vitamin D deficiency 06/06/2008  . Hyperlipidemia 06/06/2008  . MENOPAUSAL SYNDROME 03/29/2008  . Osteopenia 03/29/2008  . ALLERGIC RHINITIS 01/25/2008  . IBS 01/25/2008  . OSTEOARTHRITIS 01/25/2008  . FIBROMYALGIA 01/25/2008    [name], PT 11/18/2019, 11:55 AM  [address] [place], [place], [address] Phone: [phone]   Fax:  [phone]  Name: Linda Mcgrath MRN: [id number] Date of Birth: December 14, 1932  [facility] [address] [place], [place], [address] Phone: [phone]   Fax:  [phone]  Physical Therapy Treatment  Patient Details  Name: Krysten B Harlin MRN: [id number] Date of Birth: 12/31/1932 Referring Provider (PT): [name]   Encounter Date: 11/18/2019  PT End of Session - 11/18/19 1153    Visit Number  8    Date for PT Re-Evaluation  12/18/19    PT Start Time  1050    PT Stop Time  1150    PT Time Calculation (min)  60 min    Activity Tolerance  Patient tolerated treatment well    Behavior During Therapy  WFL for tasks assessed/performed       Past Medical History:  Diagnosis Date  . Allergic rhinitis   . Asthma   . Cataract    Bil/lens implant  . Colon polyp 1999   small polyp  . Diverticulosis   . Dysrhythmia   . Fibromyalgia   . Hyperlipidemia   . Hypothyroidism   . Internal hemorrhoids   . Leg cramps   . Lung nodule    stable LUL 9 mm  . Menopausal syndrome   . Osteoarthritis   . UTI (urinary tract infection)     Past Surgical History:  Procedure Laterality Date  . Abd U/S  11/1998   negative  . Abd U/S  01/2001   gallbladder polyps  . ABDOMINAL HYSTERECTOMY  1975   total-fibroids  . APPENDECTOMY  1975  . BREAST BIOPSY     benign  . BREAST EXCISIONAL BIOPSY Left 1957  . CARDIOVERSION N/A 03/07/2016   Procedure: CARDIOVERSION;  Surgeon: Jay Ganji, MD;  [facility];  Service: Cardiovascular;  Laterality: N/A;  . CATARACT EXTRACTION     bilateral  . CHOLECYSTECTOMY    . COLONOSCOPY  1998   Diverticulosis; polyp\  . COLONOSCOPY  09/2002   Diverticulosis, hem  . COLONOSCOPY  12/2007   diverticulosis, polyp  . DEXA  10/2001   osteopenia  . ELECTROPHYSIOLOGIC STUDY N/A 04/03/2016   Procedure: A-Flutter Ablation;  Surgeon: Will Martin Camnitz, MD;  [facility];  Service: Cardiovascular;  Laterality: N/A;  . ESOPHAGOGASTRODUODENOSCOPY  2004  . Hida  scan  01/2001   Negative  . KNEE SURGERY     right knee / 11/2004  . LAMINECTOMY WITH POSTERIOR LATERAL ARTHRODESIS LEVEL 2 N/A 07/31/2017   Procedure: DECOMPRESSIVE LAMINECTOMY LUMABR FOUR-FIVE, LUMBAR FIVE-SACRAL ONE;  Surgeon: Jones, David S, MD;  [facility];  Service: Neurosurgery;  Laterality: N/A;  . laser surgery for glaucoma Left Sep 21, 2014  . ROTATOR CUFF REPAIR     x 2 /right shoulder  . SKIN CANCER EXCISION     pre-melanoma / on face  . TEE WITHOUT CARDIOVERSION N/A 03/07/2016   Procedure: TRANSESOPHAGEAL ECHOCARDIOGRAM (TEE);  Surgeon: Jay Ganji, MD;  [facility];  Service: Cardiovascular;  Laterality: N/A;  . TOE SURGERY     rt foot     There were no vitals filed for this visit.  Subjective Assessment - 11/18/19 1113    Subjective  Reports that she slept better last night, reports that she id feeling better todau    Currently in Pain?  Yes    Pain Score  3     Pain Location  Back                       [facility]   [facility] [address] [place], [place], [address] Phone: [phone]   Fax:  [phone]  Physical Therapy Treatment  Patient Details  Name: Karlissa B Supak MRN: [id number] Date of Birth: 02/08/1933 Referring Provider (PT): [name]   Encounter Date: 11/18/2019  PT End of Session - 11/18/19 1153    Visit Number  8    Date for PT Re-Evaluation  12/18/19    PT Start Time  1050    PT Stop Time  1150    PT Time Calculation (min)  60 min    Activity Tolerance  Patient tolerated treatment well    Behavior During Therapy  WFL for tasks assessed/performed       Past Medical History:  Diagnosis Date  . Allergic rhinitis   . Asthma   . Cataract    Bil/lens implant  . Colon polyp 1999   small polyp  . Diverticulosis   . Dysrhythmia   . Fibromyalgia   . Hyperlipidemia   . Hypothyroidism   . Internal hemorrhoids   . Leg cramps   . Lung nodule    stable LUL 9 mm  . Menopausal syndrome   . Osteoarthritis   . UTI (urinary tract infection)     Past Surgical History:  Procedure Laterality Date  . Abd U/S  11/1998   negative  . Abd U/S  01/2001   gallbladder polyps  . ABDOMINAL HYSTERECTOMY  1975   total-fibroids  . APPENDECTOMY  1975  . BREAST BIOPSY     benign  . BREAST EXCISIONAL BIOPSY Left 1957  . CARDIOVERSION N/A 03/07/2016   Procedure: CARDIOVERSION;  Surgeon: Jay Ganji, MD;  [facility];  Service: Cardiovascular;  Laterality: N/A;  . CATARACT EXTRACTION     bilateral  . CHOLECYSTECTOMY    . COLONOSCOPY  1998   Diverticulosis; polyp\  . COLONOSCOPY  09/2002   Diverticulosis, hem  . COLONOSCOPY  12/2007   diverticulosis, polyp  . DEXA  10/2001   osteopenia  . ELECTROPHYSIOLOGIC STUDY N/A 04/03/2016   Procedure: A-Flutter Ablation;  Surgeon: Will Martin Camnitz, MD;  [facility];  Service: Cardiovascular;  Laterality: N/A;  . ESOPHAGOGASTRODUODENOSCOPY  2004  . Hida  scan  01/2001   Negative  . KNEE SURGERY     right knee / 11/2004  . LAMINECTOMY WITH POSTERIOR LATERAL ARTHRODESIS LEVEL 2 N/A 07/31/2017   Procedure: DECOMPRESSIVE LAMINECTOMY LUMABR FOUR-FIVE, LUMBAR FIVE-SACRAL ONE;  Surgeon: Jones, David S, MD;  [facility];  Service: Neurosurgery;  Laterality: N/A;  . laser surgery for glaucoma Left Sep 21, 2014  . ROTATOR CUFF REPAIR     x 2 /right shoulder  . SKIN CANCER EXCISION     pre-melanoma / on face  . TEE WITHOUT CARDIOVERSION N/A 03/07/2016   Procedure: TRANSESOPHAGEAL ECHOCARDIOGRAM (TEE);  Surgeon: Jay Ganji, MD;  [facility];  Service: Cardiovascular;  Laterality: N/A;  . TOE SURGERY     rt foot     There were no vitals filed for this visit.  Subjective Assessment - 11/18/19 1113    Subjective  Reports that she slept better last night, reports that she id feeling better todau    Currently in Pain?  Yes    Pain Score  3     Pain Location  Back                       [facility]

## 2019-11-23 ENCOUNTER — Other Ambulatory Visit: Payer: Self-pay

## 2019-11-23 ENCOUNTER — Encounter: Payer: Self-pay | Admitting: Physical Therapy

## 2019-11-23 ENCOUNTER — Ambulatory Visit: Payer: Medicare Other | Admitting: Physical Therapy

## 2019-11-23 DIAGNOSIS — M542 Cervicalgia: Secondary | ICD-10-CM | POA: Diagnosis not present

## 2019-11-23 DIAGNOSIS — R262 Difficulty in walking, not elsewhere classified: Secondary | ICD-10-CM | POA: Diagnosis not present

## 2019-11-23 DIAGNOSIS — M545 Low back pain, unspecified: Secondary | ICD-10-CM

## 2019-11-23 DIAGNOSIS — M62830 Muscle spasm of back: Secondary | ICD-10-CM

## 2019-11-23 NOTE — Therapy (Signed)
[place] [address] [place], [place], [address] Phone: [phone]   Fax:  [phone]  Physical Therapy Treatment  Patient Details  Name: Linda Mcgrath MRN: [id number] Date of Birth: 10-21-1932 Referring Provider (PT): [name]   Encounter Date: 11/23/2019  PT End of Session - 11/23/19 1408    Visit Number  9    Date for PT Re-Evaluation  12/18/19    PT Start Time  1303    PT Stop Time  1353    PT Time Calculation (min)  50 min    Activity Tolerance  Patient tolerated treatment well    Behavior During Therapy  [facility] for tasks assessed/performed       Past Medical History:  Diagnosis Date  . Allergic rhinitis   . Asthma   . Cataract    Bil/lens implant  . Colon polyp 1999   small polyp  . Diverticulosis   . Dysrhythmia   . Fibromyalgia   . Hyperlipidemia   . Hypothyroidism   . Internal hemorrhoids   . Leg cramps   . Lung nodule    stable LUL 9 mm  . Menopausal syndrome   . Osteoarthritis   . UTI (urinary tract infection)     Past Surgical History:  Procedure Laterality Date  . Abd U/S  11/1998   negative  . Abd U/S  01/2001   gallbladder polyps  . ABDOMINAL HYSTERECTOMY  1975   total-fibroids  . APPENDECTOMY  1975  . BREAST BIOPSY     benign  . BREAST EXCISIONAL BIOPSY Left 1957  . CARDIOVERSION N/A 03/07/2016   Procedure: CARDIOVERSION;  Surgeon: Adrian Prows, MD;  Location: [place];  Service: Cardiovascular;  Laterality: N/A;  . CATARACT EXTRACTION     bilateral  . CHOLECYSTECTOMY    . COLONOSCOPY  1998   Diverticulosis; polyp\  . COLONOSCOPY  09/2002   Diverticulosis, hem  . COLONOSCOPY  12/2007   diverticulosis, polyp  . DEXA  10/2001   osteopenia  . ELECTROPHYSIOLOGIC STUDY N/A 04/03/2016   Procedure: A-Flutter Ablation;  Surgeon: Will Meredith Leeds, MD;  Location: [place] [facility];  Service: Cardiovascular;  Laterality: N/A;  . ESOPHAGOGASTRODUODENOSCOPY  2004  . Hida  scan  01/2001   Negative  . KNEE SURGERY     right knee / 11/2004  . LAMINECTOMY WITH POSTERIOR LATERAL ARTHRODESIS LEVEL 2 N/A 07/31/2017   Procedure: DECOMPRESSIVE LAMINECTOMY LUMABR FOUR-FIVE, LUMBAR FIVE-SACRAL ONE;  Surgeon: Eustace Moore, MD;  Location: [facility];  Service: Neurosurgery;  Laterality: N/A;  . laser surgery for glaucoma Left Sep 21, 2014  . ROTATOR CUFF REPAIR     x 2 /right shoulder  . SKIN CANCER EXCISION     pre-melanoma / on face  . TEE WITHOUT CARDIOVERSION N/A 03/07/2016   Procedure: TRANSESOPHAGEAL ECHOCARDIOGRAM (TEE);  Surgeon: Adrian Prows, MD;  Location: [facility];  Service: Cardiovascular;  Laterality: N/A;  . TOE SURGERY     rt foot     There were no vitals filed for this visit.  Subjective Assessment - 11/23/19 1310    Subjective  I am doing better today    Currently in Pain?  Yes    Pain Score  4     Pain Location  Back    Pain Orientation  Lower    Aggravating Factors   activity  D deficiency 06/06/2008  . Hyperlipidemia 06/06/2008  . MENOPAUSAL SYNDROME 03/29/2008  . Osteopenia 03/29/2008  . ALLERGIC RHINITIS 01/25/2008  . IBS 01/25/2008  . OSTEOARTHRITIS 01/25/2008  . FIBROMYALGIA 01/25/2008    [name], PT 11/23/2019, 2:14 PM  [address] [place], [place], [address] Phone: [phone]   Fax:  [phone]  Name: Linda Mcgrath MRN: [id number] Date of Birth: 10-10-1932  [place] [address] [place], [place], [address] Phone: [phone]   Fax:  [phone]  Physical Therapy Treatment  Patient Details  Name: Linda Mcgrath MRN: [id number] Date of Birth: 10-21-1932 Referring Provider (PT): [name]   Encounter Date: 11/23/2019  PT End of Session - 11/23/19 1408    Visit Number  9    Date for PT Re-Evaluation  12/18/19    PT Start Time  1303    PT Stop Time  1353    PT Time Calculation (min)  50 min    Activity Tolerance  Patient tolerated treatment well    Behavior During Therapy  [facility] for tasks assessed/performed       Past Medical History:  Diagnosis Date  . Allergic rhinitis   . Asthma   . Cataract    Bil/lens implant  . Colon polyp 1999   small polyp  . Diverticulosis   . Dysrhythmia   . Fibromyalgia   . Hyperlipidemia   . Hypothyroidism   . Internal hemorrhoids   . Leg cramps   . Lung nodule    stable LUL 9 mm  . Menopausal syndrome   . Osteoarthritis   . UTI (urinary tract infection)     Past Surgical History:  Procedure Laterality Date  . Abd U/S  11/1998   negative  . Abd U/S  01/2001   gallbladder polyps  . ABDOMINAL HYSTERECTOMY  1975   total-fibroids  . APPENDECTOMY  1975  . BREAST BIOPSY     benign  . BREAST EXCISIONAL BIOPSY Left 1957  . CARDIOVERSION N/A 03/07/2016   Procedure: CARDIOVERSION;  Surgeon: Adrian Prows, MD;  Location: [facility];  Service: Cardiovascular;  Laterality: N/A;  . CATARACT EXTRACTION     bilateral  . CHOLECYSTECTOMY    . COLONOSCOPY  1998   Diverticulosis; polyp\  . COLONOSCOPY  09/2002   Diverticulosis, hem  . COLONOSCOPY  12/2007   diverticulosis, polyp  . DEXA  10/2001   osteopenia  . ELECTROPHYSIOLOGIC STUDY N/A 04/03/2016   Procedure: A-Flutter Ablation;  Surgeon: Will Meredith Leeds, MD;  Location: [place];  Service: Cardiovascular;  Laterality: N/A;  . ESOPHAGOGASTRODUODENOSCOPY  2004  . Hida  scan  01/2001   Negative  . KNEE SURGERY     right knee / 11/2004  . LAMINECTOMY WITH POSTERIOR LATERAL ARTHRODESIS LEVEL 2 N/A 07/31/2017   Procedure: DECOMPRESSIVE LAMINECTOMY LUMABR FOUR-FIVE, LUMBAR FIVE-SACRAL ONE;  Surgeon: Eustace Moore, MD;  Location: [facility];  Service: Neurosurgery;  Laterality: N/A;  . laser surgery for glaucoma Left Sep 21, 2014  . ROTATOR CUFF REPAIR     x 2 /right shoulder  . SKIN CANCER EXCISION     pre-melanoma / on face  . TEE WITHOUT CARDIOVERSION N/A 03/07/2016   Procedure: TRANSESOPHAGEAL ECHOCARDIOGRAM (TEE);  Surgeon: Adrian Prows, MD;  Location: [facility];  Service: Cardiovascular;  Laterality: N/A;  . TOE SURGERY     rt foot     There were no vitals filed for this visit.  Subjective Assessment - 11/23/19 1310    Subjective  I am doing better today    Currently in Pain?  Yes    Pain Score  4     Pain Location  Back    Pain Orientation  Lower    Aggravating Factors   activity

## 2019-11-25 ENCOUNTER — Other Ambulatory Visit: Payer: Self-pay

## 2019-11-25 ENCOUNTER — Ambulatory Visit: Payer: Medicare Other | Attending: Orthopaedic Surgery | Admitting: Physical Therapy

## 2019-11-25 ENCOUNTER — Encounter: Payer: Self-pay | Admitting: Physical Therapy

## 2019-11-25 DIAGNOSIS — M545 Low back pain, unspecified: Secondary | ICD-10-CM

## 2019-11-25 DIAGNOSIS — M62830 Muscle spasm of back: Secondary | ICD-10-CM

## 2019-11-25 DIAGNOSIS — M542 Cervicalgia: Secondary | ICD-10-CM | POA: Insufficient documentation

## 2019-11-25 DIAGNOSIS — R262 Difficulty in walking, not elsewhere classified: Secondary | ICD-10-CM | POA: Insufficient documentation

## 2019-11-25 NOTE — Therapy (Signed)
[address] [place], [place], [address] Phone: [phone]   Fax:  [phone] Progress Note Reporting Period 10/20/19 to 11/25/19 for the first 10 visits  See note below for Objective Data and Assessment of Progress/Goals.      Physical Therapy Treatment  Patient Details  Name: Linda Mcgrath MRN: [id number] Date of Birth: 09-23-1932 Referring Provider (PT): [name]   Encounter Date: 11/25/2019  PT End of Session - 11/25/19 1234    Visit Number  10    Date for PT Re-Evaluation  12/18/19    PT Start Time  2876    PT Stop Time  1243    PT Time Calculation (min)  58 min    Activity Tolerance  Patient tolerated treatment well    Behavior During Therapy  [facility] for tasks assessed/performed       Past Medical History:  Diagnosis Date  . Allergic rhinitis   . Asthma   . Cataract    Bil/lens implant  . Colon polyp 1999   small polyp  . Diverticulosis   . Dysrhythmia   . Fibromyalgia   . Hyperlipidemia   . Hypothyroidism   . Internal hemorrhoids   . Leg cramps   . Lung nodule    stable LUL 9 mm  . Menopausal syndrome   . Osteoarthritis   . UTI (urinary tract infection)     Past Surgical History:  Procedure Laterality Date  . Abd U/S  11/1998   negative  . Abd U/S  01/2001   gallbladder polyps  . ABDOMINAL HYSTERECTOMY  1975   total-fibroids  . APPENDECTOMY  1975  . BREAST BIOPSY     benign  . BREAST EXCISIONAL BIOPSY Left 1957  . CARDIOVERSION N/A 03/07/2016   Procedure: CARDIOVERSION;  Surgeon: Adrian Prows, MD;  Location: [place];  Service: Cardiovascular;  Laterality: N/A;  . CATARACT EXTRACTION     bilateral  . CHOLECYSTECTOMY    . COLONOSCOPY  1998   Diverticulosis; polyp\  . COLONOSCOPY  09/2002   Diverticulosis, hem  . COLONOSCOPY  12/2007   diverticulosis, polyp  . DEXA  10/2001   osteopenia  . ELECTROPHYSIOLOGIC STUDY N/A 04/03/2016   Procedure: A-Flutter Ablation;   Surgeon: Will Meredith Leeds, MD;  Location: [place] CV [facility];  Service: Cardiovascular;  Laterality: N/A;  . ESOPHAGOGASTRODUODENOSCOPY  2004  . Hida scan  01/2001   Negative  . KNEE SURGERY     right knee / 11/2004  . LAMINECTOMY WITH POSTERIOR LATERAL ARTHRODESIS LEVEL 2 N/A 07/31/2017   Procedure: DECOMPRESSIVE LAMINECTOMY LUMABR FOUR-FIVE, LUMBAR FIVE-SACRAL ONE;  Surgeon: Eustace Moore, MD;  Location: [place];  Service: Neurosurgery;  Laterality: N/A;  . laser surgery for glaucoma Left Sep 21, 2014  . ROTATOR CUFF REPAIR     x 2 /right shoulder  . SKIN CANCER EXCISION     pre-melanoma / on face  . TEE WITHOUT CARDIOVERSION N/A 03/07/2016   Procedure: TRANSESOPHAGEAL ECHOCARDIOGRAM (TEE);  Surgeon: Adrian Prows, MD;  Location: [place];  Service: Cardiovascular;  Laterality: N/A;  . TOE SURGERY     rt foot     There were no vitals filed for this visit.  Subjective Assessment - 11/25/19 1150    Subjective  Still doing okay, reports a little sore and a little tired after the PT treatment this week, but Happy I could do it"    Currently in Pain?  Yes  continue to add exercises as tolerated    Consulted and Agree with Plan of Care  Patient       Patient will benefit from skilled therapeutic intervention in order to improve  the following deficits and impairments:  Abnormal gait, Pain, Improper body mechanics, Postural dysfunction, Increased muscle spasms, Decreased activity tolerance, Decreased endurance, Decreased range of motion, Decreased strength, Difficulty walking, Impaired flexibility  Visit Diagnosis: Acute bilateral low back pain without sciatica  Muscle spasm of back  Cervicalgia  Difficulty in walking, not elsewhere classified     Problem List Patient Active Problem List   Diagnosis Date Noted  . Left low back pain 10/18/2019  . Positive depression screening 10/18/2019  . Osteoarthritis of right knee 08/02/2019  . Diarrhea 07/03/2018  . Dyspepsia 07/03/2018  . S/P lumbar spinal fusion 07/31/2017  . Right low back pain 03/04/2017  . H/O atrial flutter 03/06/2016  . Obesity 11/06/2015  . Urge incontinence 01/24/2015  . Estrogen deficiency 10/25/2014  . Lichen planus 73/03/5693  . Encounter for Medicare annual wellness exam 06/17/2013  . Screening mammogram, encounter for 06/15/2012  . Prediabetes 06/03/2011  . HYPERTENSION, BENIGN ESSENTIAL 10/23/2010  . CONSTIPATION 10/23/2010  . HEMATOCHEZIA 10/23/2010  . OSTEOARTHRITIS, HANDS, BILATERAL 01/24/2010  . Hypothyroidism 08/24/2008  . Vitamin D deficiency 06/06/2008  . Hyperlipidemia 06/06/2008  . MENOPAUSAL SYNDROME 03/29/2008  . Osteopenia 03/29/2008  . ALLERGIC RHINITIS 01/25/2008  . IBS 01/25/2008  . OSTEOARTHRITIS 01/25/2008  . FIBROMYALGIA 01/25/2008    [name], PT 11/25/2019, 12:39 PM  [address] [place], [place], [address] Phone: [phone]   Fax:  [phone]  Name: Linda Mcgrath MRN: [id number] Date of Birth: 1932/12/01  continue to add exercises as tolerated    Consulted and Agree with Plan of Care  Patient       Patient will benefit from skilled therapeutic intervention in order to improve  the following deficits and impairments:  Abnormal gait, Pain, Improper body mechanics, Postural dysfunction, Increased muscle spasms, Decreased activity tolerance, Decreased endurance, Decreased range of motion, Decreased strength, Difficulty walking, Impaired flexibility  Visit Diagnosis: Acute bilateral low back pain without sciatica  Muscle spasm of back  Cervicalgia  Difficulty in walking, not elsewhere classified     Problem List Patient Active Problem List   Diagnosis Date Noted  . Left low back pain 10/18/2019  . Positive depression screening 10/18/2019  . Osteoarthritis of right knee 08/02/2019  . Diarrhea 07/03/2018  . Dyspepsia 07/03/2018  . S/P lumbar spinal fusion 07/31/2017  . Right low back pain 03/04/2017  . H/O atrial flutter 03/06/2016  . Obesity 11/06/2015  . Urge incontinence 01/24/2015  . Estrogen deficiency 10/25/2014  . Lichen planus 73/03/5693  . Encounter for Medicare annual wellness exam 06/17/2013  . Screening mammogram, encounter for 06/15/2012  . Prediabetes 06/03/2011  . HYPERTENSION, BENIGN ESSENTIAL 10/23/2010  . CONSTIPATION 10/23/2010  . HEMATOCHEZIA 10/23/2010  . OSTEOARTHRITIS, HANDS, BILATERAL 01/24/2010  . Hypothyroidism 08/24/2008  . Vitamin D deficiency 06/06/2008  . Hyperlipidemia 06/06/2008  . MENOPAUSAL SYNDROME 03/29/2008  . Osteopenia 03/29/2008  . ALLERGIC RHINITIS 01/25/2008  . IBS 01/25/2008  . OSTEOARTHRITIS 01/25/2008  . FIBROMYALGIA 01/25/2008    [name], PT 11/25/2019, 12:39 PM  [address] [place], [place], [address] Phone: [phone]   Fax:  [phone]  Name: Linda Mcgrath MRN: [id number] Date of Birth: 1932/12/01

## 2019-11-30 ENCOUNTER — Other Ambulatory Visit: Payer: Self-pay

## 2019-11-30 ENCOUNTER — Ambulatory Visit: Payer: Medicare Other | Admitting: Physical Therapy

## 2019-11-30 ENCOUNTER — Encounter: Payer: Self-pay | Admitting: Physical Therapy

## 2019-11-30 DIAGNOSIS — R262 Difficulty in walking, not elsewhere classified: Secondary | ICD-10-CM

## 2019-11-30 DIAGNOSIS — M62830 Muscle spasm of back: Secondary | ICD-10-CM | POA: Diagnosis not present

## 2019-11-30 DIAGNOSIS — M542 Cervicalgia: Secondary | ICD-10-CM

## 2019-11-30 DIAGNOSIS — M545 Low back pain, unspecified: Secondary | ICD-10-CM

## 2019-11-30 NOTE — Therapy (Signed)
[facility] [place] [facility] [address] [place], [place], [address] Phone: [phone]   Fax:  [phone]  Physical Therapy Treatment  Patient Details  Name: Linda Mcgrath MRN: [id number] Date of Birth: 05/12/1933 Referring Provider (PT): [name]   Encounter Date: 11/30/2019  PT End of Session - 11/30/19 1436    Visit Number  11    Date for PT Re-Evaluation  12/18/19    PT Start Time  1355    PT Stop Time  1450    PT Time Calculation (min)  55 min    Activity Tolerance  Patient tolerated treatment well    Behavior During Therapy  [facility] for tasks assessed/performed       Past Medical History:  Diagnosis Date  . Allergic rhinitis   . Asthma   . Cataract    Bil/lens implant  . Colon polyp 1999   small polyp  . Diverticulosis   . Dysrhythmia   . Fibromyalgia   . Hyperlipidemia   . Hypothyroidism   . Internal hemorrhoids   . Leg cramps   . Lung nodule    stable LUL 9 mm  . Menopausal syndrome   . Osteoarthritis   . UTI (urinary tract infection)     Past Surgical History:  Procedure Laterality Date  . Abd U/S  11/1998   negative  . Abd U/S  01/2001   gallbladder polyps  . ABDOMINAL HYSTERECTOMY  1975   total-fibroids  . APPENDECTOMY  1975  . BREAST BIOPSY     benign  . BREAST EXCISIONAL BIOPSY Left 1957  . CARDIOVERSION N/A 03/07/2016   Procedure: CARDIOVERSION;  Surgeon: Yates Decamp, MD;  Location: [facility] ENDOSCOPY;  Service: Cardiovascular;  Laterality: N/A;  . CATARACT EXTRACTION     bilateral  . CHOLECYSTECTOMY    . COLONOSCOPY  1998   Diverticulosis; polyp\  . COLONOSCOPY  09/2002   Diverticulosis, hem  . COLONOSCOPY  12/2007   diverticulosis, polyp  . DEXA  10/2001   osteopenia  . ELECTROPHYSIOLOGIC STUDY N/A 04/03/2016   Procedure: A-Flutter Ablation;  Surgeon: Will [name], MD;  [facility] INVASIVE CV LAB;  Service: Cardiovascular;  Laterality: N/A;  . ESOPHAGOGASTRODUODENOSCOPY  2004  . Hida  scan  01/2001   Negative  . KNEE SURGERY     right knee / 11/2004  . LAMINECTOMY WITH POSTERIOR LATERAL ARTHRODESIS LEVEL 2 N/A 07/31/2017   Procedure: DECOMPRESSIVE LAMINECTOMY LUMABR FOUR-FIVE, LUMBAR FIVE-SACRAL ONE;  Surgeon: Tia Alert, MD;  Location: [facility] OR;  Service: Neurosurgery;  Laterality: N/A;  . laser surgery for glaucoma Left Sep 21, 2014  . ROTATOR CUFF REPAIR     x 2 /right shoulder  . SKIN CANCER EXCISION     pre-melanoma / on face  . TEE WITHOUT CARDIOVERSION N/A 03/07/2016   Procedure: TRANSESOPHAGEAL ECHOCARDIOGRAM (TEE);  Surgeon: Yates Decamp, MD;  Location: [facility] ENDOSCOPY;  Service: Cardiovascular;  Laterality: N/A;  . TOE SURGERY     rt foot     There were no vitals filed for this visit.  Subjective Assessment - 11/30/19 1433    Subjective  Patient reports that she has not done much, did try to pick up sticks again wihtout much difficulty    Currently in Pain?  Yes    Pain Score  4     Pain Location  Back    Aggravating Factors   walking, picking up sticks  [facility] Adult PT Treatment/Exercise - 11/30/19 0001      Ambulation/Gait   Gait Comments  gait around the building one rest break, not much shortness of breath      Lumbar Exercises: Stretches   Passive Hamstring Stretch  Right;Left;4 reps;20 seconds      Lumbar Exercises: Aerobic   Nustep  level 4 x 5 minutes      Lumbar Exercises: Standing   Other Standing Lumbar Exercises  2.5# hip abdcution 2x10      Lumbar Exercises: Seated   Long Arc Quad on Chair  Both;10 reps;3 sets    LAQ on Chair Weights (lbs)  3#    Other Seated Lumbar Exercises  marching 3# 3x10 each, scapular retraction and extension with red tband 2x10, isometric abs 2x10, ball b/n knees 2x10, tried bent over rows no weight due to hand pain, bent over extensions    Other Seated Lumbar Exercises  partial sit ups with bosu behind her, then black tband extension with the band behind her due to pain in  thumbs      Moist Heat Therapy   Number Minutes Moist Heat  10 Minutes    Moist Heat Location  Lumbar Spine      Electrical Stimulation   Electrical Stimulation Location  lumbar and thoracic area    Electrical Stimulation Action  IFC    Electrical Stimulation Parameters  sitting    Electrical Stimulation Goals  Pain               PT Short Term Goals - 11/16/19 1132      PT SHORT TERM GOAL #1   Title  independent with HEP    Status  Achieved        PT Long Term Goals - 11/25/19 1238      PT LONG TERM GOAL #1   Title  decrease pain 50%    Status  Partially Met      PT LONG TERM GOAL #2   Title  increase lumbar ROM 25%    Status  Partially Met      PT LONG TERM GOAL #3   Title  Understand proper posture and body mechanics for housework and yardwork tasks.     Status  Partially Met      PT LONG TERM GOAL #4   Title  tolerate shopping for groceries without an increase in pain    Status  Partially Met      PT LONG TERM GOAL #5   Title  decrease TUG time to 17 seconds    Status  On-going            Plan - 11/30/19 1437    Clinical Impression Statement  Patient is having less tightness and tenderness in the lumbar mms, she still has some knots and tenderness reports that she ran out of her mm relaxers.  Did much better with walking outside today    PT Next Visit Plan  continue to add exercises as tolerated    Consulted and Agree with Plan of Care  Patient       Patient will benefit from skilled therapeutic intervention in order to improve the following deficits and impairments:  Abnormal gait, Pain, Improper body mechanics, Postural dysfunction, Increased muscle spasms, Decreased activity tolerance, Decreased endurance, Decreased range of motion, Decreased strength, Difficulty walking, Impaired flexibility  Visit Diagnosis: Acute bilateral low back pain without sciatica  Muscle spasm of back  Cervicalgia  Difficulty in walking, not elsewhere  classified     Problem List Patient Active Problem List   Diagnosis Date Noted  . Left low back pain 10/18/2019  . Positive depression screening 10/18/2019  . Osteoarthritis of right knee 08/02/2019  . Diarrhea 07/03/2018  . Dyspepsia 07/03/2018  . S/P lumbar spinal fusion 07/31/2017  . Right low back pain 03/04/2017  . H/O atrial flutter 03/06/2016  . Obesity 11/06/2015  . Urge incontinence 01/24/2015  . Estrogen deficiency 10/25/2014  . Lichen planus 12/20/2013  . Encounter for Medicare annual wellness exam 06/17/2013  . Screening mammogram, encounter for 06/15/2012  . Prediabetes 06/03/2011  . HYPERTENSION, BENIGN ESSENTIAL 10/23/2010  . CONSTIPATION 10/23/2010  . HEMATOCHEZIA 10/23/2010  . OSTEOARTHRITIS, HANDS, BILATERAL 01/24/2010  . Hypothyroidism 08/24/2008  . Vitamin D deficiency 06/06/2008  . Hyperlipidemia 06/06/2008  . MENOPAUSAL SYNDROME 03/29/2008  . Osteopenia 03/29/2008  . ALLERGIC RHINITIS 01/25/2008  . IBS 01/25/2008  . OSTEOARTHRITIS 01/25/2008  . FIBROMYALGIA 01/25/2008    Jearld Lesch., PT 11/30/2019, 2:39 PM  [facility] [place] [facility] [address] [place], [place], [address] Phone: [phone]   Fax:  [phone]  Name: Linda Mcgrath MRN: [id number] Date of Birth: 1932-09-21

## 2019-12-01 DIAGNOSIS — Z5181 Encounter for therapeutic drug level monitoring: Secondary | ICD-10-CM | POA: Diagnosis not present

## 2019-12-01 DIAGNOSIS — Z79899 Other long term (current) drug therapy: Secondary | ICD-10-CM | POA: Diagnosis not present

## 2019-12-01 DIAGNOSIS — L439 Lichen planus, unspecified: Secondary | ICD-10-CM | POA: Diagnosis not present

## 2019-12-02 ENCOUNTER — Other Ambulatory Visit: Payer: Self-pay

## 2019-12-02 ENCOUNTER — Ambulatory Visit: Payer: Medicare Other | Admitting: Physical Therapy

## 2019-12-02 ENCOUNTER — Encounter: Payer: Self-pay | Admitting: Physical Therapy

## 2019-12-02 DIAGNOSIS — R262 Difficulty in walking, not elsewhere classified: Secondary | ICD-10-CM | POA: Diagnosis not present

## 2019-12-02 DIAGNOSIS — M62830 Muscle spasm of back: Secondary | ICD-10-CM | POA: Diagnosis not present

## 2019-12-02 DIAGNOSIS — M545 Low back pain, unspecified: Secondary | ICD-10-CM

## 2019-12-02 DIAGNOSIS — M542 Cervicalgia: Secondary | ICD-10-CM | POA: Diagnosis not present

## 2019-12-02 NOTE — Therapy (Signed)
[facility] [place] [facility] [address] [place], [place], [address] Phone: [phone]   Fax:  [phone]  Physical Therapy Treatment  Patient Details  Name: Linda Mcgrath MRN: [id number] Date of Birth: October 12, 1932 Referring Provider (PT): [name]   Encounter Date: 12/02/2019  PT End of Session - 12/02/19 1435    Visit Number  12    Date for PT Re-Evaluation  12/18/19    PT Start Time  1340    PT Stop Time  1440    PT Time Calculation (min)  60 min    Activity Tolerance  Patient tolerated treatment well    Behavior During Therapy  [facility] for tasks assessed/performed       Past Medical History:  Diagnosis Date  . Allergic rhinitis   . Asthma   . Cataract    Bil/lens implant  . Colon polyp 1999   small polyp  . Diverticulosis   . Dysrhythmia   . Fibromyalgia   . Hyperlipidemia   . Hypothyroidism   . Internal hemorrhoids   . Leg cramps   . Lung nodule    stable LUL 9 mm  . Menopausal syndrome   . Osteoarthritis   . UTI (urinary tract infection)     Past Surgical History:  Procedure Laterality Date  . Abd U/S  11/1998   negative  . Abd U/S  01/2001   gallbladder polyps  . ABDOMINAL HYSTERECTOMY  1975   total-fibroids  . APPENDECTOMY  1975  . BREAST BIOPSY     benign  . BREAST EXCISIONAL BIOPSY Left 1957  . CARDIOVERSION N/A 03/07/2016   Procedure: CARDIOVERSION;  Surgeon: Yates Decamp, MD;  Location: [facility] ENDOSCOPY;  Service: Cardiovascular;  Laterality: N/A;  . CATARACT EXTRACTION     bilateral  . CHOLECYSTECTOMY    . COLONOSCOPY  1998   Diverticulosis; polyp\  . COLONOSCOPY  09/2002   Diverticulosis, hem  . COLONOSCOPY  12/2007   diverticulosis, polyp  . DEXA  10/2001   osteopenia  . ELECTROPHYSIOLOGIC STUDY N/A 04/03/2016   Procedure: A-Flutter Ablation;  Surgeon: Will [name], MD;  [facility]: [facility] INVASIVE CV LAB;  Service: Cardiovascular;  Laterality: N/A;  . ESOPHAGOGASTRODUODENOSCOPY  2004  . Hida  scan  01/2001   Negative  . KNEE SURGERY     right knee / 11/2004  . LAMINECTOMY WITH POSTERIOR LATERAL ARTHRODESIS LEVEL 2 N/A 07/31/2017   Procedure: DECOMPRESSIVE LAMINECTOMY LUMABR FOUR-FIVE, LUMBAR FIVE-SACRAL ONE;  Surgeon: Tia Alert, MD;  Location: [facility] OR;  Service: Neurosurgery;  Laterality: N/A;  . laser surgery for glaucoma Left Sep 21, 2014  . ROTATOR CUFF REPAIR     x 2 /right shoulder  . SKIN CANCER EXCISION     pre-melanoma / on face  . TEE WITHOUT CARDIOVERSION N/A 03/07/2016   Procedure: TRANSESOPHAGEAL ECHOCARDIOGRAM (TEE);  Surgeon: Yates Decamp, MD;  Location: [facility] ENDOSCOPY;  Service: Cardiovascular;  Laterality: N/A;  . TOE SURGERY     rt foot     There were no vitals filed for this visit.  Subjective Assessment - 12/02/19 1353    Subjective  Reports that she is doing pretty good after the treatment, reports that she drove to [place] yesterday and had a stressful drive and is having a little more pain today    Currently in Pain?  Yes    Pain Score  6     Pain Location  Back    Pain Orientation  Lower  Pain Descriptors / Indicators  Aching;Sore    Aggravating Factors   stress of driving in heavy traffic                       [facility] Adult PT Treatment/Exercise - 12/02/19 0001      Lumbar Exercises: Aerobic   Nustep  level 3 x 6 minutes      Lumbar Exercises: Standing   Other Standing Lumbar Exercises  2.5# hip abdcution 2x10 and marching      Lumbar Exercises: Seated   Long Arc Quad on Chair  Both;10 reps;3 sets    LAQ on Chair Weights (lbs)  3#    Other Seated Lumbar Exercises  red tband hip abduction, ball squeeze      Moist Heat Therapy   Number Minutes Moist Heat  10 Minutes    Moist Heat Location  Lumbar Spine      Electrical Stimulation   Electrical Stimulation Location  L/S area    Electrical Stimulation Action  IFC    Electrical Stimulation Parameters  sitting    Electrical Stimulation Goals  Pain      Manual Therapy   Manual  Therapy  Soft tissue mobilization    Soft tissue mobilization  bilateral lumbar and into the thoracic parapsinals, knots in the L/S area bilaterally that are tender added some work into the upper traps               PT Short Term Goals - 11/16/19 1132      PT SHORT TERM GOAL #1   Title  independent with HEP    Status  Achieved        PT Long Term Goals - 12/02/19 1442      PT LONG TERM GOAL #1   Title  decrease pain 50%    Status  Partially Met      PT LONG TERM GOAL #2   Title  increase lumbar ROM 25%    Status  Partially Met      PT LONG TERM GOAL #3   Title  Understand proper posture and body mechanics for housework and yardwork tasks.     Status  Partially Met            Plan - 12/02/19 1440    Clinical Impression Statement  Patient having a little more pain today, she is tender in the low back mms, she has knots here, She is overall moving better and tolerating more activity but still sore    PT Next Visit Plan  continue to add exercises as tolerated    Consulted and Agree with Plan of Care  Patient       Patient will benefit from skilled therapeutic intervention in order to improve the following deficits and impairments:  Abnormal gait, Pain, Improper body mechanics, Postural dysfunction, Increased muscle spasms, Decreased activity tolerance, Decreased endurance, Decreased range of motion, Decreased strength, Difficulty walking, Impaired flexibility  Visit Diagnosis: Acute bilateral low back pain without sciatica  Muscle spasm of back  Cervicalgia  Difficulty in walking, not elsewhere classified     Problem List Patient Active Problem List   Diagnosis Date Noted  . Left low back pain 10/18/2019  . Positive depression screening 10/18/2019  . Osteoarthritis of right knee 08/02/2019  . Diarrhea 07/03/2018  . Dyspepsia 07/03/2018  . S/P lumbar spinal fusion 07/31/2017  . Right low back pain 03/04/2017  . H/O atrial flutter 03/06/2016  .  Obesity 11/06/2015  .  Urge incontinence 01/24/2015  . Estrogen deficiency 10/25/2014  . Lichen planus 12/20/2013  . Encounter for Medicare annual wellness exam 06/17/2013  . Screening mammogram, encounter for 06/15/2012  . Prediabetes 06/03/2011  . HYPERTENSION, BENIGN ESSENTIAL 10/23/2010  . CONSTIPATION 10/23/2010  . HEMATOCHEZIA 10/23/2010  . OSTEOARTHRITIS, HANDS, BILATERAL 01/24/2010  . Hypothyroidism 08/24/2008  . Vitamin D deficiency 06/06/2008  . Hyperlipidemia 06/06/2008  . MENOPAUSAL SYNDROME 03/29/2008  . Osteopenia 03/29/2008  . ALLERGIC RHINITIS 01/25/2008  . IBS 01/25/2008  . OSTEOARTHRITIS 01/25/2008  . FIBROMYALGIA 01/25/2008    Jearld Lesch., PT 12/02/2019, 2:42 PM  [facility] [place] [facility] [address] [place], [place], [address] Phone: [phone]   Fax:  [phone]  Name: Linda Mcgrath MRN: [id number] Date of Birth: 1932-11-16

## 2019-12-07 ENCOUNTER — Ambulatory Visit: Payer: Medicare Other | Admitting: Physical Therapy

## 2019-12-07 ENCOUNTER — Other Ambulatory Visit: Payer: Self-pay

## 2019-12-07 ENCOUNTER — Encounter: Payer: Self-pay | Admitting: Physical Therapy

## 2019-12-07 DIAGNOSIS — R262 Difficulty in walking, not elsewhere classified: Secondary | ICD-10-CM | POA: Diagnosis not present

## 2019-12-07 DIAGNOSIS — M545 Low back pain, unspecified: Secondary | ICD-10-CM

## 2019-12-07 DIAGNOSIS — M542 Cervicalgia: Secondary | ICD-10-CM | POA: Diagnosis not present

## 2019-12-07 DIAGNOSIS — M62830 Muscle spasm of back: Secondary | ICD-10-CM

## 2019-12-07 NOTE — Therapy (Signed)
[place], [place], [address] Phone: [phone]   Fax:  [phone]  Physical Therapy Treatment  Patient Details  Name: Linda Mcgrath MRN: [id number] Date of Birth: Mar 11, 1933 Referring Provider (PT): [name]   Encounter Date: 12/07/2019  PT End of Session - 12/07/19 1423    Visit Number  13    Date for PT Re-Evaluation  12/18/19    PT Start Time  1350    PT Stop Time  1438    PT Time Calculation (min)  48 min    Activity Tolerance  Patient tolerated treatment well    Behavior During Therapy  [facility] for tasks assessed/performed       Past Medical History:  Diagnosis Date  . Allergic rhinitis   . Asthma   . Cataract    Bil/lens implant  . Colon polyp 1999   small polyp  . Diverticulosis   . Dysrhythmia   . Fibromyalgia   . Hyperlipidemia   . Hypothyroidism   . Internal hemorrhoids   . Leg cramps   . Lung nodule    stable LUL 9 mm  . Menopausal syndrome   . Osteoarthritis   . UTI (urinary tract infection)     Past Surgical History:  Procedure Laterality Date  . Abd U/S  11/1998   negative  . Abd U/S  01/2001   gallbladder polyps  . ABDOMINAL HYSTERECTOMY  1975   total-fibroids  . APPENDECTOMY  1975  . BREAST BIOPSY     benign  . BREAST EXCISIONAL BIOPSY Left 1957  . CARDIOVERSION N/A 03/07/2016   Procedure: CARDIOVERSION;  Surgeon: Adrian Prows, MD;  Location: [place];  Service: Cardiovascular;  Laterality: N/A;  . CATARACT EXTRACTION     bilateral  . CHOLECYSTECTOMY    . COLONOSCOPY  1998   Diverticulosis; polyp\  . COLONOSCOPY  09/2002   Diverticulosis, hem  . COLONOSCOPY  12/2007   diverticulosis, polyp  . DEXA  10/2001   osteopenia  . ELECTROPHYSIOLOGIC STUDY N/A 04/03/2016   Procedure: A-Flutter Ablation;  Surgeon: Will Meredith Leeds, MD;  Location: [place] CV LAB;  Service: Cardiovascular;  Laterality: N/A;  . ESOPHAGOGASTRODUODENOSCOPY  2004  . Hida  scan  01/2001   Negative  . KNEE SURGERY     right knee / 11/2004  . LAMINECTOMY WITH POSTERIOR LATERAL ARTHRODESIS LEVEL 2 N/A 07/31/2017   Procedure: DECOMPRESSIVE LAMINECTOMY LUMABR FOUR-FIVE, LUMBAR FIVE-SACRAL ONE;  Surgeon: Eustace Moore, MD;  Location: [place];  Service: Neurosurgery;  Laterality: N/A;  . laser surgery for glaucoma Left Sep 21, 2014  . ROTATOR CUFF REPAIR     x 2 /right shoulder  . SKIN CANCER EXCISION     pre-melanoma / on face  . TEE WITHOUT CARDIOVERSION N/A 03/07/2016   Procedure: TRANSESOPHAGEAL ECHOCARDIOGRAM (TEE);  Surgeon: Adrian Prows, MD;  Location: [place];  Service: Cardiovascular;  Laterality: N/A;  . TOE SURGERY     rt foot     There were no vitals filed for this visit.  Subjective Assessment - 12/07/19 1401    Subjective  Patient is walking a little more in a shuffling pattern, she looks tired and is limping more.  She describes a lot of activity with her adopted son having to be hospitalized, this included her making multiple trips to different places and standing and sitting long periods    Currently in Pain?  Yes    Pain Score  [place], [place], [address] Phone: [phone]   Fax:  [phone]  Physical Therapy Treatment  Patient Details  Name: Linda Mcgrath MRN: [id number] Date of Birth: Mar 11, 1933 Referring Provider (PT): [name]   Encounter Date: 12/07/2019  PT End of Session - 12/07/19 1423    Visit Number  13    Date for PT Re-Evaluation  12/18/19    PT Start Time  1350    PT Stop Time  1438    PT Time Calculation (min)  48 min    Activity Tolerance  Patient tolerated treatment well    Behavior During Therapy  [facility] for tasks assessed/performed       Past Medical History:  Diagnosis Date  . Allergic rhinitis   . Asthma   . Cataract    Bil/lens implant  . Colon polyp 1999   small polyp  . Diverticulosis   . Dysrhythmia   . Fibromyalgia   . Hyperlipidemia   . Hypothyroidism   . Internal hemorrhoids   . Leg cramps   . Lung nodule    stable LUL 9 mm  . Menopausal syndrome   . Osteoarthritis   . UTI (urinary tract infection)     Past Surgical History:  Procedure Laterality Date  . Abd U/S  11/1998   negative  . Abd U/S  01/2001   gallbladder polyps  . ABDOMINAL HYSTERECTOMY  1975   total-fibroids  . APPENDECTOMY  1975  . BREAST BIOPSY     benign  . BREAST EXCISIONAL BIOPSY Left 1957  . CARDIOVERSION N/A 03/07/2016   Procedure: CARDIOVERSION;  Surgeon: Adrian Prows, MD;  Location: [place];  Service: Cardiovascular;  Laterality: N/A;  . CATARACT EXTRACTION     bilateral  . CHOLECYSTECTOMY    . COLONOSCOPY  1998   Diverticulosis; polyp\  . COLONOSCOPY  09/2002   Diverticulosis, hem  . COLONOSCOPY  12/2007   diverticulosis, polyp  . DEXA  10/2001   osteopenia  . ELECTROPHYSIOLOGIC STUDY N/A 04/03/2016   Procedure: A-Flutter Ablation;  Surgeon: Will Meredith Leeds, MD;  Location: [place] CV LAB;  Service: Cardiovascular;  Laterality: N/A;  . ESOPHAGOGASTRODUODENOSCOPY  2004  . Hida  scan  01/2001   Negative  . KNEE SURGERY     right knee / 11/2004  . LAMINECTOMY WITH POSTERIOR LATERAL ARTHRODESIS LEVEL 2 N/A 07/31/2017   Procedure: DECOMPRESSIVE LAMINECTOMY LUMABR FOUR-FIVE, LUMBAR FIVE-SACRAL ONE;  Surgeon: Eustace Moore, MD;  Location: [place];  Service: Neurosurgery;  Laterality: N/A;  . laser surgery for glaucoma Left Sep 21, 2014  . ROTATOR CUFF REPAIR     x 2 /right shoulder  . SKIN CANCER EXCISION     pre-melanoma / on face  . TEE WITHOUT CARDIOVERSION N/A 03/07/2016   Procedure: TRANSESOPHAGEAL ECHOCARDIOGRAM (TEE);  Surgeon: Adrian Prows, MD;  Location: [place];  Service: Cardiovascular;  Laterality: N/A;  . TOE SURGERY     rt foot     There were no vitals filed for this visit.  Subjective Assessment - 12/07/19 1401    Subjective  Patient is walking a little more in a shuffling pattern, she looks tired and is limping more.  She describes a lot of activity with her adopted son having to be hospitalized, this included her making multiple trips to different places and standing and sitting long periods    Currently in Pain?  Yes    Pain Score  07/31/2017  . Right low back pain 03/04/2017  . H/O atrial flutter 03/06/2016  . Obesity 11/06/2015  . Urge incontinence 01/24/2015  . Estrogen deficiency 10/25/2014  . Lichen planus 11/55/2080  . Encounter for Medicare annual wellness exam 06/17/2013  . Screening mammogram, encounter for 06/15/2012  . Prediabetes 06/03/2011  . HYPERTENSION, BENIGN ESSENTIAL 10/23/2010  . CONSTIPATION 10/23/2010  . HEMATOCHEZIA 10/23/2010  . OSTEOARTHRITIS, HANDS, BILATERAL 01/24/2010  . Hypothyroidism 08/24/2008  . Vitamin D deficiency 06/06/2008  . Hyperlipidemia 06/06/2008  . MENOPAUSAL SYNDROME 03/29/2008  . Osteopenia 03/29/2008  . ALLERGIC RHINITIS 01/25/2008  . IBS 01/25/2008  . OSTEOARTHRITIS 01/25/2008  . FIBROMYALGIA 01/25/2008    [name], PT 12/07/2019, 2:27 PM  [place] [place] [address] [place], [place], [address] Phone: [phone]   Fax:  [phone]  Name: AYDA TANCREDI MRN: [id number] Date of Birth: 1932/10/19

## 2019-12-09 ENCOUNTER — Other Ambulatory Visit: Payer: Self-pay

## 2019-12-09 ENCOUNTER — Encounter: Payer: Self-pay | Admitting: Physical Therapy

## 2019-12-09 ENCOUNTER — Ambulatory Visit: Payer: Medicare Other | Admitting: Physical Therapy

## 2019-12-09 DIAGNOSIS — R262 Difficulty in walking, not elsewhere classified: Secondary | ICD-10-CM | POA: Diagnosis not present

## 2019-12-09 DIAGNOSIS — M62830 Muscle spasm of back: Secondary | ICD-10-CM | POA: Diagnosis not present

## 2019-12-09 DIAGNOSIS — M542 Cervicalgia: Secondary | ICD-10-CM | POA: Diagnosis not present

## 2019-12-09 DIAGNOSIS — M545 Low back pain, unspecified: Secondary | ICD-10-CM

## 2019-12-09 NOTE — Therapy (Signed)
[place], [place], [address] Phone: [phone]   Fax:  [phone]  Physical Therapy Treatment  Patient Details  Name: Linda Mcgrath MRN: [id number] Date of Birth: 16-Jul-1933 Referring Provider (PT): [name]   Encounter Date: 12/09/2019  PT End of Session - 12/09/19 1419    Visit Number  14    Date for PT Re-Evaluation  12/18/19    PT Start Time  1343    PT Stop Time  1430    PT Time Calculation (min)  47 min    Activity Tolerance  Patient tolerated treatment well    Behavior During Therapy  [facility] for tasks assessed/performed       Past Medical History:  Diagnosis Date  . Allergic rhinitis   . Asthma   . Cataract    Bil/lens implant  . Colon polyp 1999   small polyp  . Diverticulosis   . Dysrhythmia   . Fibromyalgia   . Hyperlipidemia   . Hypothyroidism   . Internal hemorrhoids   . Leg cramps   . Lung nodule    stable LUL 9 mm  . Menopausal syndrome   . Osteoarthritis   . UTI (urinary tract infection)     Past Surgical History:  Procedure Laterality Date  . Abd U/S  11/1998   negative  . Abd U/S  01/2001   gallbladder polyps  . ABDOMINAL HYSTERECTOMY  1975   total-fibroids  . APPENDECTOMY  1975  . BREAST BIOPSY     benign  . BREAST EXCISIONAL BIOPSY Left 1957  . CARDIOVERSION N/A 03/07/2016   Procedure: CARDIOVERSION;  Surgeon: Adrian Prows, MD;  Location: [facility];  Service: Cardiovascular;  Laterality: N/A;  . CATARACT EXTRACTION     bilateral  . CHOLECYSTECTOMY    . COLONOSCOPY  1998   Diverticulosis; polyp\  . COLONOSCOPY  09/2002   Diverticulosis, hem  . COLONOSCOPY  12/2007   diverticulosis, polyp  . DEXA  10/2001   osteopenia  . ELECTROPHYSIOLOGIC STUDY N/A 04/03/2016   Procedure: A-Flutter Ablation;  Surgeon: Will Meredith Leeds, MD;  Location: [facility];  Service: Cardiovascular;  Laterality: N/A;  . ESOPHAGOGASTRODUODENOSCOPY  2004  . [place]  scan  01/2001   Negative  . KNEE SURGERY     right knee / 11/2004  . LAMINECTOMY WITH POSTERIOR LATERAL ARTHRODESIS LEVEL 2 N/A 07/31/2017   Procedure: DECOMPRESSIVE LAMINECTOMY LUMABR FOUR-FIVE, LUMBAR FIVE-SACRAL ONE;  Surgeon: Eustace Moore, MD;  Location: [facility];  Service: Neurosurgery;  Laterality: N/A;  . laser surgery for glaucoma Left Sep 21, 2014  . ROTATOR CUFF REPAIR     x 2 /right shoulder  . SKIN CANCER EXCISION     pre-melanoma / on face  . TEE WITHOUT CARDIOVERSION N/A 03/07/2016   Procedure: TRANSESOPHAGEAL ECHOCARDIOGRAM (TEE);  Surgeon: Adrian Prows, MD;  Location: [facility];  Service: Cardiovascular;  Laterality: N/A;  . TOE SURGERY     rt foot     There were no vitals filed for this visit.  Subjective Assessment - 12/09/19 1350    Subjective  Patient reports that she was able to go to the farmers market, she reports that she was stumbling toward the end but not bad pain    Currently in Pain?  Yes    Pain Score  5     Pain Location  Back    Pain Orientation  Right;Lower    Pain Descriptors /  Hypothyroidism 08/24/2008  . Vitamin D deficiency 06/06/2008  . Hyperlipidemia 06/06/2008  . MENOPAUSAL SYNDROME 03/29/2008  . Osteopenia 03/29/2008  . ALLERGIC RHINITIS 01/25/2008  . IBS 01/25/2008  . OSTEOARTHRITIS 01/25/2008  . FIBROMYALGIA 01/25/2008    [name], PT 12/09/2019, 2:25 PM  [address] [place], [place], [address] Phone: [phone]   Fax:  [phone]  Name: Linda Mcgrath MRN: [id number] Date of Birth: May 23, 1933  [facility] [place], [place], [address] Phone: [phone]   Fax:  [phone]  Physical Therapy Treatment  Patient Details  Name: Linda Mcgrath MRN: [id number] Date of Birth: 16-Jul-1933 Referring Provider (PT): [name]   Encounter Date: 12/09/2019  PT End of Session - 12/09/19 1419    Visit Number  14    Date for PT Re-Evaluation  12/18/19    PT Start Time  1343    PT Stop Time  1430    PT Time Calculation (min)  47 min    Activity Tolerance  Patient tolerated treatment well    Behavior During Therapy  [facility] for tasks assessed/performed       Past Medical History:  Diagnosis Date  . Allergic rhinitis   . Asthma   . Cataract    Bil/lens implant  . Colon polyp 1999   small polyp  . Diverticulosis   . Dysrhythmia   . Fibromyalgia   . Hyperlipidemia   . Hypothyroidism   . Internal hemorrhoids   . Leg cramps   . Lung nodule    stable LUL 9 mm  . Menopausal syndrome   . Osteoarthritis   . UTI (urinary tract infection)     Past Surgical History:  Procedure Laterality Date  . Abd U/S  11/1998   negative  . Abd U/S  01/2001   gallbladder polyps  . ABDOMINAL HYSTERECTOMY  1975   total-fibroids  . APPENDECTOMY  1975  . BREAST BIOPSY     benign  . BREAST EXCISIONAL BIOPSY Left 1957  . CARDIOVERSION N/A 03/07/2016   Procedure: CARDIOVERSION;  Surgeon: Adrian Prows, MD;  Location: [facility];  Service: Cardiovascular;  Laterality: N/A;  . CATARACT EXTRACTION     bilateral  . CHOLECYSTECTOMY    . COLONOSCOPY  1998   Diverticulosis; polyp\  . COLONOSCOPY  09/2002   Diverticulosis, hem  . COLONOSCOPY  12/2007   diverticulosis, polyp  . DEXA  10/2001   osteopenia  . ELECTROPHYSIOLOGIC STUDY N/A 04/03/2016   Procedure: A-Flutter Ablation;  Surgeon: Will Meredith Leeds, MD;  Location: [facility];  Service: Cardiovascular;  Laterality: N/A;  . ESOPHAGOGASTRODUODENOSCOPY  2004  . [place]  scan  01/2001   Negative  . KNEE SURGERY     right knee / 11/2004  . LAMINECTOMY WITH POSTERIOR LATERAL ARTHRODESIS LEVEL 2 N/A 07/31/2017   Procedure: DECOMPRESSIVE LAMINECTOMY LUMABR FOUR-FIVE, LUMBAR FIVE-SACRAL ONE;  Surgeon: Eustace Moore, MD;  Location: [facility];  Service: Neurosurgery;  Laterality: N/A;  . laser surgery for glaucoma Left Sep 21, 2014  . ROTATOR CUFF REPAIR     x 2 /right shoulder  . SKIN CANCER EXCISION     pre-melanoma / on face  . TEE WITHOUT CARDIOVERSION N/A 03/07/2016   Procedure: TRANSESOPHAGEAL ECHOCARDIOGRAM (TEE);  Surgeon: Adrian Prows, MD;  Location: [facility];  Service: Cardiovascular;  Laterality: N/A;  . TOE SURGERY     rt foot     There were no vitals filed for this visit.  Subjective Assessment - 12/09/19 1350    Subjective  Patient reports that she was able to go to the farmers market, she reports that she was stumbling toward the end but not bad pain    Currently in Pain?  Yes    Pain Score  5     Pain Location  Back    Pain Orientation  Right;Lower    Pain Descriptors /

## 2019-12-14 ENCOUNTER — Other Ambulatory Visit: Payer: Self-pay

## 2019-12-14 ENCOUNTER — Encounter: Payer: Self-pay | Admitting: Physical Therapy

## 2019-12-14 ENCOUNTER — Ambulatory Visit: Payer: Medicare Other | Admitting: Physical Therapy

## 2019-12-14 DIAGNOSIS — M542 Cervicalgia: Secondary | ICD-10-CM | POA: Diagnosis not present

## 2019-12-14 DIAGNOSIS — M545 Low back pain, unspecified: Secondary | ICD-10-CM

## 2019-12-14 DIAGNOSIS — R262 Difficulty in walking, not elsewhere classified: Secondary | ICD-10-CM

## 2019-12-14 DIAGNOSIS — M62830 Muscle spasm of back: Secondary | ICD-10-CM | POA: Diagnosis not present

## 2019-12-14 NOTE — Therapy (Signed)
[place], [place], [address] Phone: [phone]   Fax:  [phone]  Physical Therapy Treatment  Patient Details  Name: Linda Mcgrath MRN: [id number] Date of Birth: May 25, 1933 Referring Provider (PT): [name]   Encounter Date: 12/14/2019  PT End of Session - 12/14/19 1540    Visit Number  15    Date for PT Re-Evaluation  12/18/19    PT Start Time  1400    PT Stop Time  1450    PT Time Calculation (min)  50 min    Activity Tolerance  Patient tolerated treatment well    Behavior During Therapy  [facility] for tasks assessed/performed       Past Medical History:  Diagnosis Date  . Allergic rhinitis   . Asthma   . Cataract    Bil/lens implant  . Colon polyp 1999   small polyp  . Diverticulosis   . Dysrhythmia   . Fibromyalgia   . Hyperlipidemia   . Hypothyroidism   . Internal hemorrhoids   . Leg cramps   . Lung nodule    stable LUL 9 mm  . Menopausal syndrome   . Osteoarthritis   . UTI (urinary tract infection)     Past Surgical History:  Procedure Laterality Date  . Abd U/S  11/1998   negative  . Abd U/S  01/2001   gallbladder polyps  . ABDOMINAL HYSTERECTOMY  1975   total-fibroids  . APPENDECTOMY  1975  . BREAST BIOPSY     benign  . BREAST EXCISIONAL BIOPSY Left 1957  . CARDIOVERSION N/A 03/07/2016   Procedure: CARDIOVERSION;  Surgeon: Adrian Prows, MD;  Location: [facility];  Service: Cardiovascular;  Laterality: N/A;  . CATARACT EXTRACTION     bilateral  . CHOLECYSTECTOMY    . COLONOSCOPY  1998   Diverticulosis; polyp\  . COLONOSCOPY  09/2002   Diverticulosis, hem  . COLONOSCOPY  12/2007   diverticulosis, polyp  . DEXA  10/2001   osteopenia  . ELECTROPHYSIOLOGIC STUDY N/A 04/03/2016   Procedure: A-Flutter Ablation;  Surgeon: Will Meredith Leeds, MD;  Location: [facility];  Service: Cardiovascular;  Laterality: N/A;  . ESOPHAGOGASTRODUODENOSCOPY  2004  . [place]  scan  01/2001   Negative  . KNEE SURGERY     right knee / 11/2004  . LAMINECTOMY WITH POSTERIOR LATERAL ARTHRODESIS LEVEL 2 N/A 07/31/2017   Procedure: DECOMPRESSIVE LAMINECTOMY LUMABR FOUR-FIVE, LUMBAR FIVE-SACRAL ONE;  Surgeon: Eustace Moore, MD;  Location: [facility];  Service: Neurosurgery;  Laterality: N/A;  . laser surgery for glaucoma Left Sep 21, 2014  . ROTATOR CUFF REPAIR     x 2 /right shoulder  . SKIN CANCER EXCISION     pre-melanoma / on face  . TEE WITHOUT CARDIOVERSION N/A 03/07/2016   Procedure: TRANSESOPHAGEAL ECHOCARDIOGRAM (TEE);  Surgeon: Adrian Prows, MD;  Location: [facility];  Service: Cardiovascular;  Laterality: N/A;  . TOE SURGERY     rt foot     There were no vitals filed for this visit.  Subjective Assessment - 12/14/19 1531    Subjective  Patient reports that she is still hurting and having difficulty sleeping.  She reports that she will try to see the MD this week.    Currently in Pain?  Yes    Pain Score  6     Pain Location  Back    Pain Orientation  Lower    Pain Descriptors / Indicators  HYPERTENSION, BENIGN ESSENTIAL 10/23/2010  . CONSTIPATION 10/23/2010  . HEMATOCHEZIA 10/23/2010  . OSTEOARTHRITIS, HANDS, BILATERAL 01/24/2010  . Hypothyroidism 08/24/2008  . Vitamin D deficiency 06/06/2008  . Hyperlipidemia 06/06/2008  . MENOPAUSAL SYNDROME 03/29/2008  . Osteopenia 03/29/2008  . ALLERGIC RHINITIS 01/25/2008  . IBS 01/25/2008  . OSTEOARTHRITIS 01/25/2008  . FIBROMYALGIA 01/25/2008    [name], PT 12/14/2019, 3:44 PM  [address] [place], [place], [address] Phone: [phone]   Fax:  [phone]  Name: Linda Mcgrath MRN: [id number] Date of Birth: 1933/02/20  HYPERTENSION, BENIGN ESSENTIAL 10/23/2010  . CONSTIPATION 10/23/2010  . HEMATOCHEZIA 10/23/2010  . OSTEOARTHRITIS, HANDS, BILATERAL 01/24/2010  . Hypothyroidism 08/24/2008  . Vitamin D deficiency 06/06/2008  . Hyperlipidemia 06/06/2008  . MENOPAUSAL SYNDROME 03/29/2008  . Osteopenia 03/29/2008  . ALLERGIC RHINITIS 01/25/2008  . IBS 01/25/2008  . OSTEOARTHRITIS 01/25/2008  . FIBROMYALGIA 01/25/2008    [name], PT 12/14/2019, 3:44 PM  [address] [place], [place], [address] Phone: [phone]   Fax:  [phone]  Name: Linda Mcgrath MRN: [id number] Date of Birth: 1933/02/20

## 2019-12-15 ENCOUNTER — Ambulatory Visit (INDEPENDENT_AMBULATORY_CARE_PROVIDER_SITE_OTHER): Payer: Medicare Other | Admitting: Family Medicine

## 2019-12-15 ENCOUNTER — Ambulatory Visit (INDEPENDENT_AMBULATORY_CARE_PROVIDER_SITE_OTHER)
Admission: RE | Admit: 2019-12-15 | Discharge: 2019-12-15 | Disposition: A | Discharge status: Home / Self Care | Payer: Medicare Other | Source: Ambulatory Visit | Attending: Family Medicine | Admitting: Family Medicine

## 2019-12-15 ENCOUNTER — Encounter: Payer: Self-pay | Admitting: Family Medicine

## 2019-12-15 ENCOUNTER — Other Ambulatory Visit: Payer: Self-pay

## 2019-12-15 VITALS — BP 146/88 | HR 56 | Temp 97.6°F | Ht 62.5 in | Wt 184.2 lb

## 2019-12-15 DIAGNOSIS — G8929 Other chronic pain: Secondary | ICD-10-CM

## 2019-12-15 DIAGNOSIS — G4701 Insomnia due to medical condition: Secondary | ICD-10-CM

## 2019-12-15 DIAGNOSIS — G4700 Insomnia, unspecified: Secondary | ICD-10-CM | POA: Insufficient documentation

## 2019-12-15 DIAGNOSIS — M545 Low back pain, unspecified: Secondary | ICD-10-CM

## 2019-12-15 NOTE — Assessment & Plan Note (Signed)
Multi factorial- with urinary frequency and chronic back pain and age  Disc sleep hygiene Disc strategies for brain chatter Refilled methocabamol to help with pm back discomfort

## 2019-12-15 NOTE — Assessment & Plan Note (Signed)
Will re ref to PT so she can continue-this is helping and enables her to be mobile Xray LS today- check on fusion and deg change No new neuro changes Refilled methocarbamol- this may relieve discomfort and enable her to sleep Urged caution of sedation

## 2019-12-15 NOTE — Progress Notes (Signed)
Subjective:    Patient ID: Linda Mcgrath, female    DOB: [birth year], 86 y.o.   MRN: [id number]  This visit occurred during the SARS-CoV-2 public health emergency.  Safety protocols were in place, including screening questions prior to the visit, additional usage of staff PPE, and extensive cleaning of exam room while observing appropriate contact time as indicated for disinfecting solutions.    HPI Pt presents to discuss extending PT and also to discuss sleeping problems   Wt Readings from Last 3 Encounters:  12/15/19 184 lb 4 oz (83.6 kg)  10/18/19 184 lb 8 oz (83.7 kg)  08/03/19 183 lb 8 oz (83.2 kg)   33.16 kg/m   Seeing PT for chronic back pain  Still having "an awful time with my back"  Has had some improvement- but not full  (massage and exercises really help)  Last visit is upcoming and it was advised we extend it   Back pain continues to wrap around to hips  Does not go down to legs  No numbness  No trouble with strength/able to lift toes     Wakes up at night quite a bit  Not getting rest  She would like to get 6 hours per night if possible  Gets up to urinate twice  Does not sleep during the day or nap (would if she could- has not been able to)  Can sit in recliner and relax with heat for 30 minutes   Bed time 10 pm (very dark and cool) except for night light  Is generally sleepy then and most of the time she sleeps  Then wakes up 1:30 - to urinate -goes back to sleep  Then dozes until 5-6 am   (back pain keeps her from going to sleep) Hard to get comfortable -moves around a lot  Cannot keep a pillow under her knees     She has taken the muscle relaxer -helpful but out of it    Last visit note includes this:   Patient has been able to do more recently including shopping and going to the farmers market, she is reporting some incresae low back back, she has had issues with trying to sleep, "pain wakes me up".  The tenderness of the lumbar area has  been very tender, to not tender to back to day is very tender, knots are palpable in the lumbar area and into the SI area.     PT Next Visit Plan  Patient was going to see her MD and we will see if we need to continue, if so will do renewal    Consulted and Agree with Plan of Care  Patient    She had a laminectomy in the past as well as a fusion  She sees Dr Yetta Barre -neuro surgeon  Has not seen ortho   Patient Active Problem List   Diagnosis Date Noted  . Chronic low back pain 10/18/2019  . Positive depression screening 10/18/2019  . Osteoarthritis of right knee 08/02/2019  . Diarrhea 07/03/2018  . Dyspepsia 07/03/2018  . S/P lumbar spinal fusion 07/31/2017  . Right low back pain 03/04/2017  . H/O atrial flutter 03/06/2016  . Obesity 11/06/2015  . Urge incontinence 01/24/2015  . Estrogen deficiency 10/25/2014  . Lichen planus 12/20/2013  . Encounter for Medicare annual wellness exam 06/17/2013  . Screening mammogram, encounter for 06/15/2012  . Prediabetes 06/03/2011  . HYPERTENSION, BENIGN ESSENTIAL 10/23/2010  . CONSTIPATION 10/23/2010  . HEMATOCHEZIA 10/23/2010  .  OSTEOARTHRITIS, HANDS, BILATERAL 01/24/2010  . Hypothyroidism 08/24/2008  . Vitamin D deficiency 06/06/2008  . Hyperlipidemia 06/06/2008  . MENOPAUSAL SYNDROME 03/29/2008  . Osteopenia 03/29/2008  . ALLERGIC RHINITIS 01/25/2008  . IBS 01/25/2008  . OSTEOARTHRITIS 01/25/2008  . FIBROMYALGIA 01/25/2008   Past Medical History:  Diagnosis Date  . Allergic rhinitis   . Asthma   . Cataract    Bil/lens implant  . Colon polyp 1999   small polyp  . Diverticulosis   . Dysrhythmia   . Fibromyalgia   . Hyperlipidemia   . Hypothyroidism   . Internal hemorrhoids   . Leg cramps   . Lung nodule    stable LUL 9 mm  . Menopausal syndrome   . Osteoarthritis   . UTI (urinary tract infection)    Past Surgical History:  Procedure Laterality Date  . Abd U/S  11/1998   negative  . Abd U/S  01/2001   gallbladder  polyps  . ABDOMINAL HYSTERECTOMY  1975   total-fibroids  . APPENDECTOMY  1975  . BREAST BIOPSY     benign  . BREAST EXCISIONAL BIOPSY Left 1957  . CARDIOVERSION N/A 03/07/2016   Procedure: CARDIOVERSION;  Surgeon: Yates Decamp, MD;  Location: [facility];  Service: Cardiovascular;  Laterality: N/A;  . CATARACT EXTRACTION     bilateral  . CHOLECYSTECTOMY    . COLONOSCOPY  1998   Diverticulosis; polyp\  . COLONOSCOPY  09/2002   Diverticulosis, hem  . COLONOSCOPY  12/2007   diverticulosis, polyp  . DEXA  10/2001   osteopenia  . ELECTROPHYSIOLOGIC STUDY N/A 04/03/2016   Procedure: A-Flutter Ablation;  Surgeon: Will [name], MD;  Location: [facility];  Service: Cardiovascular;  Laterality: N/A;  . ESOPHAGOGASTRODUODENOSCOPY  2004  . [place] scan  01/2001   Negative  . KNEE SURGERY     right knee / 11/2004  . LAMINECTOMY WITH POSTERIOR LATERAL ARTHRODESIS LEVEL 2 N/A 07/31/2017   Procedure: DECOMPRESSIVE LAMINECTOMY LUMABR FOUR-FIVE, LUMBAR FIVE-SACRAL ONE;  Surgeon: Tia Alert, MD;  Location: [facility] OR;  Service: Neurosurgery;  Laterality: N/A;  . laser surgery for glaucoma Left Sep 21, 2014  . ROTATOR CUFF REPAIR     x 2 /right shoulder  . SKIN CANCER EXCISION     pre-melanoma / on face  . TEE WITHOUT CARDIOVERSION N/A 03/07/2016   Procedure: TRANSESOPHAGEAL ECHOCARDIOGRAM (TEE);  Surgeon: Yates Decamp, MD;  Location: [facility];  Service: Cardiovascular;  Laterality: N/A;  . TOE SURGERY     rt foot    Social History   Tobacco Use  . Smoking status: Never Smoker  . Smokeless tobacco: Never Used  Substance Use Topics  . Alcohol use: No    Alcohol/week: 0.0 standard drinks    Comment: occasional-rare  . Drug use: No   Family History  Problem Relation Age of Onset  . Parkinsonism Father   . Colon cancer Brother   . Allergies Mother   . Heart disease Mother   . Kidney failure Son        congenital  (kidney transplant)   . Heart failure Son        died suddenly of  CHF    Allergies  Allergen Reactions  . Shellfish Allergy Anaphylaxis and Nausea And Vomiting  . Tetracycline Hives, Nausea And Vomiting, Rash and Other (See Comments)    SYSTEMIC REACTION: heart palpitations,muscle spasms, vomiting  . Amlodipine Besylate Other (See Comments)    REACTION: muscle spasms, extremity swelling, low bp  .  Ciprofloxacin Other (See Comments)    REACTION: muscle spasms, insomnia  . Codeine Other (See Comments)    REACTION: hallucinations  . Fosamax [Alendronate Sodium]   . Propoxyphene Hcl Other (See Comments)    Unknown  . Sulfamethoxazole-Trimethoprim Nausea Only and Other (See Comments)    REACTION: dizzy, could not focus eyes and could not concentrate and nausea  . Tape Other (See Comments)    BANDAIDS TAKE OFF HER SKIN; PLEASE USE COBAN OR PAPER   . Xarelto [Rivaroxaban] Other (See Comments)    Aches and pains and nervousness  . Amoxicillin Rash  . 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 Penicillins Swelling, Rash and Other (See Comments)    Has patient had a PCN reaction causing immediate rash, facial/tongue/throat swelling, SOB or lightheadedness with hypotension: Yes Has patient had a PCN reaction causing severe rash involving mucus membranes or skin necrosis: No Has patient had a PCN reaction that required hospitalization No Has patient had a PCN reaction occurring within the last 10 years: No If all of the above answers are "NO", then may proceed with Cephalosporin use.    Current Outpatient Medications on File Prior to Visit  Medication Sig Dispense Refill  . Ascorbic Acid (VITAMIN C) 1000 MG tablet Take 1,000 mg by mouth daily.      . B Complex Vitamins (VITAMIN B COMPLEX PO) Take 1 tablet by mouth daily.    . Biotin 10 MG TABS Take 1 tablet by mouth every morning.     .  Calcium Citrate-Vitamin D (CALCIUM CITRATE + D) 250-200 MG-UNIT TABS Take 1 tablet by mouth daily.    . cholecalciferol (VITAMIN D) 1000 units tablet Take 1,000 Units by mouth daily.    Marland Kitchen estradiol (ESTRACE) 1 MG tablet Take 1 tablet (1 mg total) by mouth daily. 90 tablet 3  . famotidine (PEPCID) 20 MG tablet Take 1 tablet (20 mg total) by mouth 2 (two) times daily. 60 tablet 3  . Fexofenadine HCl (MUCINEX ALLERGY PO) Take 1 tablet by mouth daily as needed (Allergies).     . folic acid (FOLVITE) 1 MG tablet Take 1 mg by mouth daily.    Marland Kitchen glucosamine-chondroitin 500-400 MG tablet Take 2 tablets by mouth daily.    Marland Kitchen levocetirizine (XYZAL ALLERGY 24HR) 5 MG tablet Take 5 mg by mouth every evening.    Marland Kitchen levothyroxine (SYNTHROID) 88 MCG tablet Take 1 tablet (88 mcg total) by mouth daily with breakfast. 90 tablet 3  . loratadine (CLARITIN) 10 MG tablet Take 10 mg by mouth daily as needed for allergies.     Marland Kitchen MAGNESIUM-OXIDE PO Take 1 tablet by mouth daily.    . Methenamine-Sodium Salicylate (AZO URINARY TRACT DEFENSE PO) Take 1 capsule by mouth daily.    . methocarbamol (ROBAXIN) 500 MG tablet Take 1 tablet (500 mg total) by mouth every 8 (eight) hours as needed for muscle spasms. Caution of sedation 30 tablet 1  . Multiple Vitamin (MULTIVITAMIN) capsule Take 1 capsule by mouth daily.      . TURMERIC PO Take 1 capsule by mouth 2 (two) times daily.     . vitamin B-12 (CYANOCOBALAMIN) 500 MCG tablet Take 500 mcg by mouth daily.    Marland Kitchen VITAMIN D PO Take 4,000 Units by mouth daily.     No  current facility-administered medications on file prior to visit.     Review of Systems  Constitutional: Positive for fatigue. Negative for activity change, appetite change, fever and unexpected weight change.  HENT: Negative for congestion, ear pain, rhinorrhea, sinus pressure and sore throat.   Eyes: Negative for pain, redness and visual disturbance.  Respiratory: Negative for cough, shortness of breath and wheezing.    Cardiovascular: Negative for chest pain and palpitations.  Gastrointestinal: Negative for abdominal pain, blood in stool, constipation and diarrhea.  Endocrine: Negative for polydipsia and polyuria.  Genitourinary: Negative for dysuria, frequency and urgency.  Musculoskeletal: Positive for back pain. Negative for arthralgias and myalgias.  Skin: Negative for pallor and rash.  Allergic/Immunologic: Negative for environmental allergies.  Neurological: Negative for dizziness, syncope and headaches.  Hematological: Negative for adenopathy. Does not bruise/bleed easily.  Psychiatric/Behavioral: Positive for sleep disturbance. Negative for decreased concentration and dysphoric mood. The patient is not nervous/anxious.        Objective:   Physical Exam Constitutional:      General: She is not in acute distress.    Appearance: Normal appearance. She is well-developed. She is obese. She is not ill-appearing.  HENT:     Head: Normocephalic and atraumatic.  Eyes:     General: No scleral icterus.    Conjunctiva/sclera: Conjunctivae normal.     Pupils: Pupils are equal, round, and reactive to light.  Cardiovascular:     Rate and Rhythm: Normal rate and regular rhythm.  Pulmonary:     Effort: Pulmonary effort is normal.     Breath sounds: Normal breath sounds. No wheezing or rales.  Abdominal:     General: Bowel sounds are normal. There is no distension.     Palpations: Abdomen is soft.     Tenderness: There is no abdominal tenderness.  Musculoskeletal:        General: Tenderness present.     Cervical back: Normal range of motion and neck supple.     Lumbar back: Spasms and tenderness present. No swelling, edema or bony tenderness. Decreased range of motion. Negative right straight leg raise test and negative left straight leg raise test.     Comments: Poor rom of LS due to prior fusion  Pain with L lateral bend and flex over 90 deg  No deformity Tenderness is mostly muscular -on the R  (with tight musculature) Nl rom of hips  No acute neuro changes    Lymphadenopathy:     Cervical: No cervical adenopathy.  Skin:    General: Skin is warm and dry.     Coloration: Skin is not pale.     Findings: No erythema or rash.  Neurological:     Mental Status: She is alert.     Cranial Nerves: No cranial nerve deficit.     Sensory: No sensory deficit.     Motor: No atrophy or abnormal muscle tone.     Coordination: Coordination normal.     Deep Tendon Reflexes: Reflexes are normal and symmetric. Reflexes normal.     Comments: Negative SLR  Psychiatric:        Attention and Perception: Attention normal.        Mood and Affect: Mood normal.        Cognition and Memory: Cognition normal.     Comments: Pleasant            Assessment & Plan:   Problem List Items Addressed This Visit      Other   Chronic  low back pain - Primary    Will re ref to PT so she can continue-this is helping and enables her to be mobile Xray LS today- check on fusion and deg change No new neuro changes Refilled methocarbamol- this may relieve discomfort and enable her to sleep Urged caution of sedation       Relevant Orders   DG Lumbar Spine Complete   Ambulatory referral to Physical Therapy   Insomnia    Multi factorial- with urinary frequency and chronic back pain and age  Disc sleep hygiene Disc strategies for brain chatter Refilled methocabamol to help with pm back discomfort

## 2019-12-15 NOTE — Patient Instructions (Signed)
Let's refill your methocarbamol -it may help comfort and sleep  Use caution of sedation   Continue PT and heat , and walking I placed a referral for PT   Xray today- looking for any changes

## 2019-12-16 ENCOUNTER — Ambulatory Visit: Payer: Medicare Other | Admitting: Physical Therapy

## 2019-12-16 ENCOUNTER — Encounter: Payer: Self-pay | Admitting: Physical Therapy

## 2019-12-16 DIAGNOSIS — M542 Cervicalgia: Secondary | ICD-10-CM | POA: Diagnosis not present

## 2019-12-16 DIAGNOSIS — M62830 Muscle spasm of back: Secondary | ICD-10-CM | POA: Diagnosis not present

## 2019-12-16 DIAGNOSIS — R262 Difficulty in walking, not elsewhere classified: Secondary | ICD-10-CM

## 2019-12-16 DIAGNOSIS — M545 Low back pain, unspecified: Secondary | ICD-10-CM

## 2019-12-16 NOTE — Therapy (Signed)
[facility] [place] [facility] [address] [place], [place], [address] Phone: [phone]   Fax:  [phone]  Physical Therapy Treatment  Patient Details  Name: Linda Mcgrath MRN: [id number] Date of Birth: [birth year] Referring Provider (PT): [name]   Encounter Date: 12/16/2019  PT End of Session - 12/16/19 1537    Visit Number  16    Date for PT Re-Evaluation  12/18/19    PT Start Time  1350    PT Stop Time  1440    PT Time Calculation (min)  50 min    Activity Tolerance  Patient tolerated treatment well    Behavior During Therapy  [facility] for tasks assessed/performed       Past Medical History:  Diagnosis Date  . Allergic rhinitis   . Asthma   . Cataract    Bil/lens implant  . Colon polyp 1999   small polyp  . Diverticulosis   . Dysrhythmia   . Fibromyalgia   . Hyperlipidemia   . Hypothyroidism   . Internal hemorrhoids   . Leg cramps   . Lung nodule    stable LUL 9 mm  . Menopausal syndrome   . Osteoarthritis   . UTI (urinary tract infection)     Past Surgical History:  Procedure Laterality Date  . Abd U/S  11/1998   negative  . Abd U/S  01/2001   gallbladder polyps  . ABDOMINAL HYSTERECTOMY  1975   total-fibroids  . APPENDECTOMY  1975  . BREAST BIOPSY     benign  . BREAST EXCISIONAL BIOPSY Left 1957  . CARDIOVERSION N/A 03/07/2016   Procedure: CARDIOVERSION;  Surgeon: Yates Decamp, MD;  Location: [facility] ENDOSCOPY;  Service: Cardiovascular;  Laterality: N/A;  . CATARACT EXTRACTION     bilateral  . CHOLECYSTECTOMY    . COLONOSCOPY  1998   Diverticulosis; polyp\  . COLONOSCOPY  09/2002   Diverticulosis, hem  . COLONOSCOPY  12/2007   diverticulosis, polyp  . DEXA  10/2001   osteopenia  . ELECTROPHYSIOLOGIC STUDY N/A 04/03/2016   Procedure: A-Flutter Ablation;  Surgeon: Will [name], MD;  [facility] INVASIVE CV LAB;  Service: Cardiovascular;  Laterality: N/A;  . ESOPHAGOGASTRODUODENOSCOPY  2004  . Hida  scan  01/2001   Negative  . KNEE SURGERY     right knee / 11/2004  . LAMINECTOMY WITH POSTERIOR LATERAL ARTHRODESIS LEVEL 2 N/A 07/31/2017   Procedure: DECOMPRESSIVE LAMINECTOMY LUMABR FOUR-FIVE, LUMBAR FIVE-SACRAL ONE;  Surgeon: Tia Alert, MD;  Location: [facility] OR;  Service: Neurosurgery;  Laterality: N/A;  . laser surgery for glaucoma Left Sep 21, 2014  . ROTATOR CUFF REPAIR     x 2 /right shoulder  . SKIN CANCER EXCISION     pre-melanoma / on face  . TEE WITHOUT CARDIOVERSION N/A 03/07/2016   Procedure: TRANSESOPHAGEAL ECHOCARDIOGRAM (TEE);  Surgeon: Yates Decamp, MD;  Location: [facility] ENDOSCOPY;  Service: Cardiovascular;  Laterality: N/A;  . TOE SURGERY     rt foot     There were no vitals filed for this visit.  Subjective Assessment - 12/16/19 1357    Subjective  Patient saw the MD yesterday, had X-rays that show DDD, arthritis and bone spurring, she has some anterolisthesis, MD "wants me to have more PT and gave me more medication"    Currently in Pain?  Yes    Pain Score  6     Pain Location  Back    Pain Orientation  Lower  Pain Descriptors / Indicators  Aching;Sore    Aggravating Factors   reports a lot of stress today with a logging truck pulling down her internet and cable, and then all the trucks out at her house                       [facility] Adult PT Treatment/Exercise - 12/16/19 0001      Lumbar Exercises: Aerobic   Nustep  level 4 x 7 minutes      Lumbar Exercises: Seated   Long Arc Quad on Chair  Both;10 reps;3 sets    LAQ on Chair Weights (lbs)  3#    Other Seated Lumbar Exercises  marching 2.5#, scapular retraction no band due to the hand issues, black tband extension lumbar    Other Seated Lumbar Exercises  red tband hip abduction, ball squeeze      Lumbar Exercises: Supine   Pelvic Tilt  5 reps;5 seconds    Clam  15 reps    Other Supine Lumbar Exercises  feet on ball K2C, trunk rotation, small bridges, isometric abs      Moist Heat Therapy    Number Minutes Moist Heat  15 Minutes    Moist Heat Location  Lumbar Spine      Electrical Stimulation   Electrical Stimulation Location  L/S area    Electrical Stimulation Action  IFC    Electrical Stimulation Parameters  sitting    Electrical Stimulation Goals  Pain      Manual Therapy   Manual Therapy  Soft tissue mobilization    Soft tissue mobilization  bilateral lumbar and into the thoracic parapsinals, knots in the L/S area bilaterally that are tender added some work into the upper traps               PT Short Term Goals - 11/16/19 1132      PT SHORT TERM GOAL #1   Title  independent with HEP    Status  Achieved        PT Long Term Goals - 12/16/19 1544      PT LONG TERM GOAL #1   Title  decrease pain 50%    Status  Partially Met            Plan - 12/16/19 1538    Clinical Impression Statement  Patient reports some increaesd stress in her life and hurting more, she did see the MD yesterday.  MD gave her more medication and wants her to continue PT, she had x-rays that showed DDD, arthritis, bone spurs and some spondylolisthesis    PT Next Visit Plan  we will continue it seems tha she has had a set back with stress and activity    Consulted and Agree with Plan of Care  Patient       Patient will benefit from skilled therapeutic intervention in order to improve the following deficits and impairments:  Abnormal gait, Pain, Improper body mechanics, Postural dysfunction, Increased muscle spasms, Decreased activity tolerance, Decreased endurance, Decreased range of motion, Decreased strength, Difficulty walking, Impaired flexibility  Visit Diagnosis: Acute bilateral low back pain without sciatica  Muscle spasm of back  Cervicalgia  Difficulty in walking, not elsewhere classified     Problem List Patient Active Problem List   Diagnosis Date Noted  . Insomnia 12/15/2019  . Chronic low back pain 10/18/2019  . Positive depression screening  10/18/2019  . Osteoarthritis of right knee 08/02/2019  . Diarrhea 07/03/2018  .  Dyspepsia 07/03/2018  . S/P lumbar spinal fusion 07/31/2017  . Right low back pain 03/04/2017  . H/O atrial flutter 03/06/2016  . Obesity 11/06/2015  . Urge incontinence 01/24/2015  . Estrogen deficiency 10/25/2014  . Lichen planus 12/20/2013  . Encounter for Medicare annual wellness exam 06/17/2013  . Screening mammogram, encounter for 06/15/2012  . Prediabetes 06/03/2011  . HYPERTENSION, BENIGN ESSENTIAL 10/23/2010  . CONSTIPATION 10/23/2010  . HEMATOCHEZIA 10/23/2010  . OSTEOARTHRITIS, HANDS, BILATERAL 01/24/2010  . Hypothyroidism 08/24/2008  . Vitamin D deficiency 06/06/2008  . Hyperlipidemia 06/06/2008  . MENOPAUSAL SYNDROME 03/29/2008  . Osteopenia 03/29/2008  . ALLERGIC RHINITIS 01/25/2008  . IBS 01/25/2008  . OSTEOARTHRITIS 01/25/2008  . FIBROMYALGIA 01/25/2008    Jearld Lesch., PT 12/16/2019, 3:45 PM  [facility] [address] [place], [place], [address] Phone: [phone]   Fax:  [phone]  Name: VONCIA ZDROJEWSKI MRN: [id number] Date of Birth: 12/14/1932

## 2019-12-19 ENCOUNTER — Telehealth: Payer: Self-pay | Admitting: Family Medicine

## 2019-12-19 DIAGNOSIS — G8929 Other chronic pain: Secondary | ICD-10-CM

## 2019-12-19 NOTE — Telephone Encounter (Signed)
-----   Message from Tammi Sou, [place] sent at 12/17/2019  4:40 PM EDT ----- Pt notified of xray results and Dr. Marliss Coots comments. Pt agrees with Ortho referral, she would like to see someone in [place] if possible. Please put referral in and I advised pt our [facility] will call to schedule appt

## 2019-12-19 NOTE — Telephone Encounter (Signed)
Referral is done Will send to [facility]

## 2019-12-20 ENCOUNTER — Other Ambulatory Visit: Payer: Self-pay | Admitting: Family Medicine

## 2019-12-20 DIAGNOSIS — Z1231 Encounter for screening mammogram for malignant neoplasm of breast: Secondary | ICD-10-CM

## 2019-12-21 ENCOUNTER — Other Ambulatory Visit: Payer: Self-pay

## 2019-12-21 ENCOUNTER — Ambulatory Visit: Payer: Medicare Other | Admitting: Physical Therapy

## 2019-12-21 ENCOUNTER — Encounter: Payer: Self-pay | Admitting: Physical Therapy

## 2019-12-21 DIAGNOSIS — M542 Cervicalgia: Secondary | ICD-10-CM | POA: Diagnosis not present

## 2019-12-21 DIAGNOSIS — M545 Low back pain, unspecified: Secondary | ICD-10-CM

## 2019-12-21 DIAGNOSIS — M62830 Muscle spasm of back: Secondary | ICD-10-CM | POA: Diagnosis not present

## 2019-12-21 DIAGNOSIS — R262 Difficulty in walking, not elsewhere classified: Secondary | ICD-10-CM

## 2019-12-21 NOTE — Telephone Encounter (Signed)
Ok to refill robaxin

## 2019-12-21 NOTE — Therapy (Signed)
HYPERTENSION, BENIGN ESSENTIAL 10/23/2010  . CONSTIPATION 10/23/2010  . HEMATOCHEZIA 10/23/2010  . OSTEOARTHRITIS, HANDS, BILATERAL 01/24/2010  . Hypothyroidism 08/24/2008  . Vitamin D deficiency 06/06/2008  . Hyperlipidemia 06/06/2008  . MENOPAUSAL SYNDROME 03/29/2008  . Osteopenia 03/29/2008  . ALLERGIC RHINITIS 01/25/2008  . IBS 01/25/2008  . OSTEOARTHRITIS 01/25/2008  . FIBROMYALGIA 01/25/2008    [name], PT 12/21/2019, 2:29 PM  [address] [place], [place], [address] Phone: [phone]   Fax:  [phone]  Name: Linda Mcgrath MRN: [id number] Date of Birth: [birth year]  [place], [place], [address] Phone: [phone]   Fax:  [phone]  Physical Therapy Treatment  Patient Details  Name: Linda Mcgrath MRN: [id number] Date of Birth: 1933-05-22 Referring Provider (PT): [name]   Encounter Date: 12/21/2019  PT End of Session - 12/21/19 1424    Visit Number  17    Date for PT Re-Evaluation  01/20/20    PT Start Time  1340    PT Stop Time  1440    PT Time Calculation (min)  60 min    Activity Tolerance  Patient tolerated treatment well    Behavior During Therapy  [facility] for tasks assessed/performed       Past Medical History:  Diagnosis Date  . Allergic rhinitis   . Asthma   . Cataract    Bil/lens implant  . Colon polyp 1999   small polyp  . Diverticulosis   . Dysrhythmia   . Fibromyalgia   . Hyperlipidemia   . Hypothyroidism   . Internal hemorrhoids   . Leg cramps   . Lung nodule    stable LUL 9 mm  . Menopausal syndrome   . Osteoarthritis   . UTI (urinary tract infection)     Past Surgical History:  Procedure Laterality Date  . Abd U/S  11/1998   negative  . Abd U/S  01/2001   gallbladder polyps  . ABDOMINAL HYSTERECTOMY  1975   total-fibroids  . APPENDECTOMY  1975  . BREAST BIOPSY     benign  . BREAST EXCISIONAL BIOPSY Left 1957  . CARDIOVERSION N/A 03/07/2016   Procedure: CARDIOVERSION;  Surgeon: Adrian Prows, MD;  Location: [place];  Service: Cardiovascular;  Laterality: N/A;  . CATARACT EXTRACTION     bilateral  . CHOLECYSTECTOMY    . COLONOSCOPY  1998   Diverticulosis; polyp\  . COLONOSCOPY  09/2002   Diverticulosis, hem  . COLONOSCOPY  12/2007   diverticulosis, polyp  . DEXA  10/2001   osteopenia  . ELECTROPHYSIOLOGIC STUDY N/A 04/03/2016   Procedure: A-Flutter Ablation;  Surgeon: Will Meredith Leeds, MD;  Location: [place] CV LAB;  Service: Cardiovascular;  Laterality: N/A;  . ESOPHAGOGASTRODUODENOSCOPY  2004  . Hida  scan  01/2001   Negative  . KNEE SURGERY     right knee / 11/2004  . LAMINECTOMY WITH POSTERIOR LATERAL ARTHRODESIS LEVEL 2 N/A 07/31/2017   Procedure: DECOMPRESSIVE LAMINECTOMY LUMABR FOUR-FIVE, LUMBAR FIVE-SACRAL ONE;  Surgeon: Eustace Moore, MD;  Location: [facility];  Service: Neurosurgery;  Laterality: N/A;  . laser surgery for glaucoma Left Sep 21, 2014  . ROTATOR CUFF REPAIR     x 2 /right shoulder  . SKIN CANCER EXCISION     pre-melanoma / on face  . TEE WITHOUT CARDIOVERSION N/A 03/07/2016   Procedure: TRANSESOPHAGEAL ECHOCARDIOGRAM (TEE);  Surgeon: Adrian Prows, MD;  Location: [place];  Service: Cardiovascular;  Laterality: N/A;  . TOE SURGERY     rt foot     There were no vitals filed for this visit.  Subjective Assessment - 12/21/19 1347    Subjective  Patient is reporting that she is still hurting she has not been able to fill her medication yet, reports that she has tried to stay active but is still hurting    Currently in Pain?  Yes    Pain Score  5     Pain Location  Back    Pain Orientation  Lower;Right  [place], [place], [address] Phone: [phone]   Fax:  [phone]  Physical Therapy Treatment  Patient Details  Name: Linda Mcgrath MRN: [id number] Date of Birth: 1933-05-22 Referring Provider (PT): [name]   Encounter Date: 12/21/2019  PT End of Session - 12/21/19 1424    Visit Number  17    Date for PT Re-Evaluation  01/20/20    PT Start Time  1340    PT Stop Time  1440    PT Time Calculation (min)  60 min    Activity Tolerance  Patient tolerated treatment well    Behavior During Therapy  [facility] for tasks assessed/performed       Past Medical History:  Diagnosis Date  . Allergic rhinitis   . Asthma   . Cataract    Bil/lens implant  . Colon polyp 1999   small polyp  . Diverticulosis   . Dysrhythmia   . Fibromyalgia   . Hyperlipidemia   . Hypothyroidism   . Internal hemorrhoids   . Leg cramps   . Lung nodule    stable LUL 9 mm  . Menopausal syndrome   . Osteoarthritis   . UTI (urinary tract infection)     Past Surgical History:  Procedure Laterality Date  . Abd U/S  11/1998   negative  . Abd U/S  01/2001   gallbladder polyps  . ABDOMINAL HYSTERECTOMY  1975   total-fibroids  . APPENDECTOMY  1975  . BREAST BIOPSY     benign  . BREAST EXCISIONAL BIOPSY Left 1957  . CARDIOVERSION N/A 03/07/2016   Procedure: CARDIOVERSION;  Surgeon: Adrian Prows, MD;  Location: [facility];  Service: Cardiovascular;  Laterality: N/A;  . CATARACT EXTRACTION     bilateral  . CHOLECYSTECTOMY    . COLONOSCOPY  1998   Diverticulosis; polyp\  . COLONOSCOPY  09/2002   Diverticulosis, hem  . COLONOSCOPY  12/2007   diverticulosis, polyp  . DEXA  10/2001   osteopenia  . ELECTROPHYSIOLOGIC STUDY N/A 04/03/2016   Procedure: A-Flutter Ablation;  Surgeon: Will Meredith Leeds, MD;  Location: [place] CV LAB;  Service: Cardiovascular;  Laterality: N/A;  . ESOPHAGOGASTRODUODENOSCOPY  2004  . Hida  scan  01/2001   Negative  . KNEE SURGERY     right knee / 11/2004  . LAMINECTOMY WITH POSTERIOR LATERAL ARTHRODESIS LEVEL 2 N/A 07/31/2017   Procedure: DECOMPRESSIVE LAMINECTOMY LUMABR FOUR-FIVE, LUMBAR FIVE-SACRAL ONE;  Surgeon: Eustace Moore, MD;  Location: [facility];  Service: Neurosurgery;  Laterality: N/A;  . laser surgery for glaucoma Left Sep 21, 2014  . ROTATOR CUFF REPAIR     x 2 /right shoulder  . SKIN CANCER EXCISION     pre-melanoma / on face  . TEE WITHOUT CARDIOVERSION N/A 03/07/2016   Procedure: TRANSESOPHAGEAL ECHOCARDIOGRAM (TEE);  Surgeon: Adrian Prows, MD;  Location: [place];  Service: Cardiovascular;  Laterality: N/A;  . TOE SURGERY     rt foot     There were no vitals filed for this visit.  Subjective Assessment - 12/21/19 1347    Subjective  Patient is reporting that she is still hurting she has not been able to fill her medication yet, reports that she has tried to stay active but is still hurting    Currently in Pain?  Yes    Pain Score  5     Pain Location  Back    Pain Orientation  Lower;Right

## 2019-12-22 ENCOUNTER — Telehealth: Payer: Self-pay

## 2019-12-22 NOTE — Telephone Encounter (Signed)
Excellent -thanks for letting  me know

## 2019-12-22 NOTE — Telephone Encounter (Signed)
Pt left v/m that she has an appt to see Dr Sherley Bounds for a FU appt on 05/04/21of back surgery pt had 2 years ago. This is FYI only and pt will be back in touch with Dr [name] after appt with Dr Ronnald Ramp.

## 2019-12-23 ENCOUNTER — Ambulatory Visit: Payer: Medicare Other | Admitting: Physical Therapy

## 2019-12-23 ENCOUNTER — Encounter: Payer: Self-pay | Admitting: Physical Therapy

## 2019-12-23 ENCOUNTER — Other Ambulatory Visit: Payer: Self-pay

## 2019-12-23 DIAGNOSIS — M62830 Muscle spasm of back: Secondary | ICD-10-CM | POA: Diagnosis not present

## 2019-12-23 DIAGNOSIS — M545 Low back pain, unspecified: Secondary | ICD-10-CM

## 2019-12-23 DIAGNOSIS — M542 Cervicalgia: Secondary | ICD-10-CM

## 2019-12-23 DIAGNOSIS — R262 Difficulty in walking, not elsewhere classified: Secondary | ICD-10-CM | POA: Diagnosis not present

## 2019-12-23 NOTE — Therapy (Signed)
[facility] [place] [facility] [address] [place], [place], [address] Phone: [phone]   Fax:  [phone]  Physical Therapy Treatment  Patient Details  Name: DENAYA HOTCHKISS MRN: [id number] Date of Birth: 05-06-1933 Referring Provider (PT): [name]   Encounter Date: 12/23/2019  PT End of Session - 12/23/19 1443    Visit Number  18    Date for PT Re-Evaluation  01/20/20    PT Start Time  1350    PT Stop Time  1440    PT Time Calculation (min)  50 min    Activity Tolerance  Patient tolerated treatment well    Behavior During Therapy  [facility] for tasks assessed/performed       Past Medical History:  Diagnosis Date  . Allergic rhinitis   . Asthma   . Cataract    Bil/lens implant  . Colon polyp 1999   small polyp  . Diverticulosis   . Dysrhythmia   . Fibromyalgia   . Hyperlipidemia   . Hypothyroidism   . Internal hemorrhoids   . Leg cramps   . Lung nodule    stable LUL 9 mm  . Menopausal syndrome   . Osteoarthritis   . UTI (urinary tract infection)     Past Surgical History:  Procedure Laterality Date  . Abd U/S  11/1998   negative  . Abd U/S  01/2001   gallbladder polyps  . ABDOMINAL HYSTERECTOMY  1975   total-fibroids  . APPENDECTOMY  1975  . BREAST BIOPSY     benign  . BREAST EXCISIONAL BIOPSY Left 1957  . CARDIOVERSION N/A 03/07/2016   Procedure: CARDIOVERSION;  Surgeon: Yates Decamp, MD;  Location: [facility] ENDOSCOPY;  Service: Cardiovascular;  Laterality: N/A;  . CATARACT EXTRACTION     bilateral  . CHOLECYSTECTOMY    . COLONOSCOPY  1998   Diverticulosis; polyp\  . COLONOSCOPY  09/2002   Diverticulosis, hem  . COLONOSCOPY  12/2007   diverticulosis, polyp  . DEXA  10/2001   osteopenia  . ELECTROPHYSIOLOGIC STUDY N/A 04/03/2016   Procedure: A-Flutter Ablation;  Surgeon: Will [name], MD;  Location: [facility] INVASIVE CV LAB;  Service: Cardiovascular;  Laterality: N/A;  . ESOPHAGOGASTRODUODENOSCOPY  2004  . Hida  scan  01/2001   Negative  . KNEE SURGERY     right knee / 11/2004  . LAMINECTOMY WITH POSTERIOR LATERAL ARTHRODESIS LEVEL 2 N/A 07/31/2017   Procedure: DECOMPRESSIVE LAMINECTOMY LUMABR FOUR-FIVE, LUMBAR FIVE-SACRAL ONE;  Surgeon: Tia Alert, MD;  Location: [facility] OR;  Service: Neurosurgery;  Laterality: N/A;  . laser surgery for glaucoma Left Sep 21, 2014  . ROTATOR CUFF REPAIR     x 2 /right shoulder  . SKIN CANCER EXCISION     pre-melanoma / on face  . TEE WITHOUT CARDIOVERSION N/A 03/07/2016   Procedure: TRANSESOPHAGEAL ECHOCARDIOGRAM (TEE);  Surgeon: Yates Decamp, MD;  Location: [facility] ENDOSCOPY;  Service: Cardiovascular;  Laterality: N/A;  . TOE SURGERY     rt foot     There were no vitals filed for this visit.  Subjective Assessment - 12/23/19 1352    Subjective  Patient reports that she went to the farmers market and did some shopping, reports that she did okay some increased pain toward the end    Currently in Pain?  Yes    Pain Score  4     Pain Location  Back    Pain Orientation  Right    Pain Descriptors / Indicators  Aching;Sore    Pain Type  Acute pain                       [facility] Adult PT Treatment/Exercise - 12/23/19 0001      High Level Balance   High Level Balance Activities  Side stepping;Backward walking;Negotiating over obstacles      Lumbar Exercises: Stretches   Passive Hamstring Stretch  Right;Left;4 reps;20 seconds    Gastroc Stretch  Right;Left;4 reps;20 seconds      Lumbar Exercises: Aerobic   Nustep  level 5 x 7 minutes      Lumbar Exercises: Standing   Other Standing Lumbar Exercises  2.5# hip abdcution 2x10 and marching, added knee flexion      Lumbar Exercises: Seated   Long Arc Quad on Chair  Both;10 reps;3 sets    LAQ on Chair Weights (lbs)  3#    Other Seated Lumbar Exercises  marching 3# 2x10 each, ball squeeze b/n knees, isometric abs with physio ball      Manual Therapy   Manual Therapy  Soft tissue mobilization    Manual  therapy comments  gentle PROM stretch for HS and piriformis and knee to chest with some hip abduction               PT Short Term Goals - 11/16/19 1132      PT SHORT TERM GOAL #1   Title  independent with HEP    Status  Achieved        PT Long Term Goals - 12/23/19 1447      PT LONG TERM GOAL #1   Title  decrease pain 50%    Status  Partially Met      PT LONG TERM GOAL #2   Title  increase lumbar ROM 25%    Status  Partially Met            Plan - 12/23/19 1444    Clinical Impression Statement  Patient will be seeing the MD next week, we will send a note with her.  She did well with going shopping yesterday, she remains tight in the lumbar area and into the right buttock with some knots present, she has not been able to do much arm exercises due to han and thumb pain, may try some handles to see if this helps    PT Next Visit Plan  write MD note    Consulted and Agree with Plan of Care  Patient       Patient will benefit from skilled therapeutic intervention in order to improve the following deficits and impairments:  Abnormal gait, Pain, Improper body mechanics, Postural dysfunction, Increased muscle spasms, Decreased activity tolerance, Decreased endurance, Decreased range of motion, Decreased strength, Difficulty walking, Impaired flexibility  Visit Diagnosis: Acute bilateral low back pain without sciatica  Muscle spasm of back  Cervicalgia  Difficulty in walking, not elsewhere classified     Problem List Patient Active Problem List   Diagnosis Date Noted  . Insomnia 12/15/2019  . Chronic low back pain 10/18/2019  . Positive depression screening 10/18/2019  . Osteoarthritis of right knee 08/02/2019  . Diarrhea 07/03/2018  . Dyspepsia 07/03/2018  . S/P lumbar spinal fusion 07/31/2017  . Right low back pain 03/04/2017  . H/O atrial flutter 03/06/2016  . Obesity 11/06/2015  . Urge incontinence 01/24/2015  . Estrogen deficiency 10/25/2014  .  Lichen planus 12/20/2013  . Encounter for Medicare annual wellness exam 06/17/2013  .  Screening mammogram, encounter for 06/15/2012  . Prediabetes 06/03/2011  . HYPERTENSION, BENIGN ESSENTIAL 10/23/2010  . CONSTIPATION 10/23/2010  . HEMATOCHEZIA 10/23/2010  . OSTEOARTHRITIS, HANDS, BILATERAL 01/24/2010  . Hypothyroidism 08/24/2008  . Vitamin D deficiency 06/06/2008  . Hyperlipidemia 06/06/2008  . MENOPAUSAL SYNDROME 03/29/2008  . Osteopenia 03/29/2008  . ALLERGIC RHINITIS 01/25/2008  . IBS 01/25/2008  . OSTEOARTHRITIS 01/25/2008  . FIBROMYALGIA 01/25/2008    Jearld Lesch., PT 12/23/2019, 3:39 PM  [facility] [place] [facility] [address] [place], [place], [address] Phone: [phone]   Fax:  [phone]  Name: DAZIE SUHRE MRN: [id number] Date of Birth: September 14, 1932

## 2019-12-27 ENCOUNTER — Ambulatory Visit: Payer: Medicare Other | Attending: Orthopaedic Surgery | Admitting: Physical Therapy

## 2019-12-27 ENCOUNTER — Encounter: Payer: Self-pay | Admitting: Physical Therapy

## 2019-12-27 ENCOUNTER — Other Ambulatory Visit: Payer: Self-pay

## 2019-12-27 DIAGNOSIS — R262 Difficulty in walking, not elsewhere classified: Secondary | ICD-10-CM | POA: Insufficient documentation

## 2019-12-27 DIAGNOSIS — M542 Cervicalgia: Secondary | ICD-10-CM | POA: Insufficient documentation

## 2019-12-27 DIAGNOSIS — M545 Low back pain, unspecified: Secondary | ICD-10-CM

## 2019-12-27 DIAGNOSIS — M62830 Muscle spasm of back: Secondary | ICD-10-CM | POA: Diagnosis not present

## 2019-12-27 NOTE — Therapy (Signed)
Osteoarthritis of right knee 08/02/2019  . Diarrhea 07/03/2018  .  Dyspepsia 07/03/2018  . S/P lumbar spinal fusion 07/31/2017  . Right low back pain 03/04/2017  . H/O atrial flutter 03/06/2016  . Obesity 11/06/2015  . Urge incontinence 01/24/2015  . Estrogen deficiency 10/25/2014  . Lichen planus 79/09/4095  . Encounter for Medicare annual wellness exam 06/17/2013  . Screening mammogram, encounter for 06/15/2012  . Prediabetes 06/03/2011  . HYPERTENSION, BENIGN ESSENTIAL 10/23/2010  . CONSTIPATION 10/23/2010  . HEMATOCHEZIA 10/23/2010  . OSTEOARTHRITIS, HANDS, BILATERAL 01/24/2010  . Hypothyroidism 08/24/2008  . Vitamin D deficiency 06/06/2008  . Hyperlipidemia 06/06/2008  . MENOPAUSAL SYNDROME 03/29/2008  . Osteopenia 03/29/2008  . ALLERGIC RHINITIS 01/25/2008  . IBS 01/25/2008  . OSTEOARTHRITIS 01/25/2008  . FIBROMYALGIA 01/25/2008    [name], PT 12/27/2019, 2:32 PM  [address] [place], [place], [address] Phone: [phone]   Fax:  [phone]  Name: Linda Mcgrath MRN: [id number] Date of Birth: 1933-03-01  [address] [place], [place], [address] Phone: [phone]   Fax:  [phone]  Physical Therapy Treatment  Patient Details  Name: Linda Mcgrath MRN: [id number] Date of Birth: 1932/10/05 Referring Provider (PT): [name]   Encounter Date: 12/27/2019  PT End of Session - 12/27/19 1429    Visit Number  19    Date for PT Re-Evaluation  01/20/20    PT Start Time  1350    PT Stop Time  1435    PT Time Calculation (min)  45 min    Activity Tolerance  Patient limited by pain    Behavior During Therapy  [facility] for tasks assessed/performed       Past Medical History:  Diagnosis Date  . Allergic rhinitis   . Asthma   . Cataract    Bil/lens implant  . Colon polyp 1999   small polyp  . Diverticulosis   . Dysrhythmia   . Fibromyalgia   . Hyperlipidemia   . Hypothyroidism   . Internal hemorrhoids   . Leg cramps   . Lung nodule    stable LUL 9 mm  . Menopausal syndrome   . Osteoarthritis   . UTI (urinary tract infection)     Past Surgical History:  Procedure Laterality Date  . Abd U/S  11/1998   negative  . Abd U/S  01/2001   gallbladder polyps  . ABDOMINAL HYSTERECTOMY  1975   total-fibroids  . APPENDECTOMY  1975  . BREAST BIOPSY     benign  . BREAST EXCISIONAL BIOPSY Left 1957  . CARDIOVERSION N/A 03/07/2016   Procedure: CARDIOVERSION;  Surgeon: Adrian Prows, MD;  Location: [place];  Service: Cardiovascular;  Laterality: N/A;  . CATARACT EXTRACTION     bilateral  . CHOLECYSTECTOMY    . COLONOSCOPY  1998   Diverticulosis; polyp\  . COLONOSCOPY  09/2002   Diverticulosis, hem  . COLONOSCOPY  12/2007   diverticulosis, polyp  . DEXA  10/2001   osteopenia  . ELECTROPHYSIOLOGIC STUDY N/A 04/03/2016   Procedure: A-Flutter Ablation;  Surgeon: Will Meredith Leeds, MD;  Location: [place];  Service: Cardiovascular;  Laterality: N/A;  . ESOPHAGOGASTRODUODENOSCOPY  2004  . Hida scan   01/2001   Negative  . KNEE SURGERY     right knee / 11/2004  . LAMINECTOMY WITH POSTERIOR LATERAL ARTHRODESIS LEVEL 2 N/A 07/31/2017   Procedure: DECOMPRESSIVE LAMINECTOMY LUMABR FOUR-FIVE, LUMBAR FIVE-SACRAL ONE;  Surgeon: Eustace Moore, MD;  Location: [facility];  Service: Neurosurgery;  Laterality: N/A;  . laser surgery for glaucoma Left Sep 21, 2014  . ROTATOR CUFF REPAIR     x 2 /right shoulder  . SKIN CANCER EXCISION     pre-melanoma / on face  . TEE WITHOUT CARDIOVERSION N/A 03/07/2016   Procedure: TRANSESOPHAGEAL ECHOCARDIOGRAM (TEE);  Surgeon: Adrian Prows, MD;  Location: [facility];  Service: Cardiovascular;  Laterality: N/A;  . TOE SURGERY     rt foot     There were no vitals filed for this visit.  Subjective Assessment - 12/27/19 1425    Subjective  Patient reports that with weather cahnge she is hurting a little more today    Currently in Pain?  Yes    Pain Score  5     Pain Location  Back    Aggravating Factors   rainy weather  Osteoarthritis of right knee 08/02/2019  . Diarrhea 07/03/2018  .  Dyspepsia 07/03/2018  . S/P lumbar spinal fusion 07/31/2017  . Right low back pain 03/04/2017  . H/O atrial flutter 03/06/2016  . Obesity 11/06/2015  . Urge incontinence 01/24/2015  . Estrogen deficiency 10/25/2014  . Lichen planus 79/09/4095  . Encounter for Medicare annual wellness exam 06/17/2013  . Screening mammogram, encounter for 06/15/2012  . Prediabetes 06/03/2011  . HYPERTENSION, BENIGN ESSENTIAL 10/23/2010  . CONSTIPATION 10/23/2010  . HEMATOCHEZIA 10/23/2010  . OSTEOARTHRITIS, HANDS, BILATERAL 01/24/2010  . Hypothyroidism 08/24/2008  . Vitamin D deficiency 06/06/2008  . Hyperlipidemia 06/06/2008  . MENOPAUSAL SYNDROME 03/29/2008  . Osteopenia 03/29/2008  . ALLERGIC RHINITIS 01/25/2008  . IBS 01/25/2008  . OSTEOARTHRITIS 01/25/2008  . FIBROMYALGIA 01/25/2008    [name], PT 12/27/2019, 2:32 PM  [place] [address] [place], [place], [address] Phone: [phone]   Fax:  [phone]  Name: Linda Mcgrath MRN: [id number] Date of Birth: 1933-03-01

## 2019-12-28 DIAGNOSIS — M4317 Spondylolisthesis, lumbosacral region: Secondary | ICD-10-CM | POA: Diagnosis not present

## 2019-12-30 ENCOUNTER — Encounter: Payer: Self-pay | Admitting: Physical Therapy

## 2019-12-30 ENCOUNTER — Other Ambulatory Visit: Payer: Self-pay

## 2019-12-30 ENCOUNTER — Ambulatory Visit: Payer: Medicare Other | Admitting: Physical Therapy

## 2019-12-30 DIAGNOSIS — M542 Cervicalgia: Secondary | ICD-10-CM | POA: Diagnosis not present

## 2019-12-30 DIAGNOSIS — R262 Difficulty in walking, not elsewhere classified: Secondary | ICD-10-CM | POA: Diagnosis not present

## 2019-12-30 DIAGNOSIS — M62830 Muscle spasm of back: Secondary | ICD-10-CM

## 2019-12-30 DIAGNOSIS — M545 Low back pain, unspecified: Secondary | ICD-10-CM

## 2019-12-30 NOTE — Therapy (Signed)
[address] [place], [place], [address] Phone: [phone]   Fax:  [phone] Progress Note Reporting Period 11/30/19 to 12/30/19 for visits 11-20  See note below for Objective Data and Assessment of Progress/Goals.      Physical Therapy Treatment  Patient Details  Name: Linda Mcgrath MRN: [id number] Date of Birth: 1932/09/09 Referring Provider (PT): [name]   Encounter Date: 12/30/2019  PT End of Session - 12/30/19 1553    Visit Number  20    Date for PT Re-Evaluation  01/20/20    PT Start Time  1527    PT Stop Time  1616    PT Time Calculation (min)  49 min    Activity Tolerance  Patient limited by pain    Behavior During Therapy  [facility] for tasks assessed/performed       Past Medical History:  Diagnosis Date  . Allergic rhinitis   . Asthma   . Cataract    Bil/lens implant  . Colon polyp 1999   small polyp  . Diverticulosis   . Dysrhythmia   . Fibromyalgia   . Hyperlipidemia   . Hypothyroidism   . Internal hemorrhoids   . Leg cramps   . Lung nodule    stable LUL 9 mm  . Menopausal syndrome   . Osteoarthritis   . UTI (urinary tract infection)     Past Surgical History:  Procedure Laterality Date  . Abd U/S  11/1998   negative  . Abd U/S  01/2001   gallbladder polyps  . ABDOMINAL HYSTERECTOMY  1975   total-fibroids  . APPENDECTOMY  1975  . BREAST BIOPSY     benign  . BREAST EXCISIONAL BIOPSY Left 1957  . CARDIOVERSION N/A 03/07/2016   Procedure: CARDIOVERSION;  Surgeon: Adrian Prows, MD;  Location: [facility];  Service: Cardiovascular;  Laterality: N/A;  . CATARACT EXTRACTION     bilateral  . CHOLECYSTECTOMY    . COLONOSCOPY  1998   Diverticulosis; polyp\  . COLONOSCOPY  09/2002   Diverticulosis, hem  . COLONOSCOPY  12/2007   diverticulosis, polyp  . DEXA  10/2001   osteopenia  . ELECTROPHYSIOLOGIC STUDY N/A 04/03/2016   Procedure: A-Flutter Ablation;  Surgeon: Will Meredith Leeds, MD;  Location: [place] [facility];  Service: Cardiovascular;  Laterality: N/A;  . ESOPHAGOGASTRODUODENOSCOPY  2004  . Hida scan  01/2001   Negative  . KNEE SURGERY     right knee / 11/2004  . LAMINECTOMY WITH POSTERIOR LATERAL ARTHRODESIS LEVEL 2 N/A 07/31/2017   Procedure: DECOMPRESSIVE LAMINECTOMY LUMABR FOUR-FIVE, LUMBAR FIVE-SACRAL ONE;  Surgeon: Eustace Moore, MD;  Location: [facility];  Service: Neurosurgery;  Laterality: N/A;  . laser surgery for glaucoma Left Sep 21, 2014  . ROTATOR CUFF REPAIR     x 2 /right shoulder  . SKIN CANCER EXCISION     pre-melanoma / on face  . TEE WITHOUT CARDIOVERSION N/A 03/07/2016   Procedure: TRANSESOPHAGEAL ECHOCARDIOGRAM (TEE);  Surgeon: Adrian Prows, MD;  Location: [facility];  Service: Cardiovascular;  Laterality: N/A;  . TOE SURGERY     rt foot     There were no vitals filed for this visit.  Subjective Assessment - 12/30/19 1531    Subjective  I did not sleep well last night, back was hurting    Currently in Pain?  Yes    Pain Score  6     Pain Location  Back  [address] [place], [place], [address] Phone: [phone]   Fax:  [phone] Progress Note Reporting Period 11/30/19 to 12/30/19 for visits 11-20  See note below for Objective Data and Assessment of Progress/Goals.      Physical Therapy Treatment  Patient Details  Name: Linda Mcgrath MRN: [id number] Date of Birth: 1932/09/09 Referring Provider (PT): [name]   Encounter Date: 12/30/2019  PT End of Session - 12/30/19 1553    Visit Number  20    Date for PT Re-Evaluation  01/20/20    PT Start Time  1527    PT Stop Time  1616    PT Time Calculation (min)  49 min    Activity Tolerance  Patient limited by pain    Behavior During Therapy  [facility] for tasks assessed/performed       Past Medical History:  Diagnosis Date  . Allergic rhinitis   . Asthma   . Cataract    Bil/lens implant  . Colon polyp 1999   small polyp  . Diverticulosis   . Dysrhythmia   . Fibromyalgia   . Hyperlipidemia   . Hypothyroidism   . Internal hemorrhoids   . Leg cramps   . Lung nodule    stable LUL 9 mm  . Menopausal syndrome   . Osteoarthritis   . UTI (urinary tract infection)     Past Surgical History:  Procedure Laterality Date  . Abd U/S  11/1998   negative  . Abd U/S  01/2001   gallbladder polyps  . ABDOMINAL HYSTERECTOMY  1975   total-fibroids  . APPENDECTOMY  1975  . BREAST BIOPSY     benign  . BREAST EXCISIONAL BIOPSY Left 1957  . CARDIOVERSION N/A 03/07/2016   Procedure: CARDIOVERSION;  Surgeon: Adrian Prows, MD;  Location: [facility];  Service: Cardiovascular;  Laterality: N/A;  . CATARACT EXTRACTION     bilateral  . CHOLECYSTECTOMY    . COLONOSCOPY  1998   Diverticulosis; polyp\  . COLONOSCOPY  09/2002   Diverticulosis, hem  . COLONOSCOPY  12/2007   diverticulosis, polyp  . DEXA  10/2001   osteopenia  . ELECTROPHYSIOLOGIC STUDY N/A 04/03/2016   Procedure: A-Flutter Ablation;  Surgeon: Will Meredith Leeds, MD;  Location: [facility];  Service: Cardiovascular;  Laterality: N/A;  . ESOPHAGOGASTRODUODENOSCOPY  2004  . Hida scan  01/2001   Negative  . KNEE SURGERY     right knee / 11/2004  . LAMINECTOMY WITH POSTERIOR LATERAL ARTHRODESIS LEVEL 2 N/A 07/31/2017   Procedure: DECOMPRESSIVE LAMINECTOMY LUMABR FOUR-FIVE, LUMBAR FIVE-SACRAL ONE;  Surgeon: Eustace Moore, MD;  Location: [facility];  Service: Neurosurgery;  Laterality: N/A;  . laser surgery for glaucoma Left Sep 21, 2014  . ROTATOR CUFF REPAIR     x 2 /right shoulder  . SKIN CANCER EXCISION     pre-melanoma / on face  . TEE WITHOUT CARDIOVERSION N/A 03/07/2016   Procedure: TRANSESOPHAGEAL ECHOCARDIOGRAM (TEE);  Surgeon: Adrian Prows, MD;  Location: [facility];  Service: Cardiovascular;  Laterality: N/A;  . TOE SURGERY     rt foot     There were no vitals filed for this visit.  Subjective Assessment - 12/30/19 1531    Subjective  I did not sleep well last night, back was hurting    Currently in Pain?  Yes    Pain Score  6     Pain Location  Back  10/23/2010  . OSTEOARTHRITIS, HANDS, BILATERAL 01/24/2010  . Hypothyroidism 08/24/2008  . Vitamin D deficiency 06/06/2008  . Hyperlipidemia 06/06/2008  . MENOPAUSAL SYNDROME 03/29/2008  . Osteopenia 03/29/2008  . ALLERGIC RHINITIS 01/25/2008  . IBS 01/25/2008  . OSTEOARTHRITIS 01/25/2008  . FIBROMYALGIA 01/25/2008    [name], PT 12/30/2019, 4:03 PM  [address] [place], [place], [address] Phone: [phone]   Fax:  [phone]  Name: Linda Mcgrath MRN: [id number] Date of Birth: [birth year]

## 2020-01-04 ENCOUNTER — Ambulatory Visit: Payer: Medicare Other | Admitting: Physical Therapy

## 2020-01-04 ENCOUNTER — Other Ambulatory Visit: Payer: Self-pay

## 2020-01-04 ENCOUNTER — Encounter: Payer: Self-pay | Admitting: Physical Therapy

## 2020-01-04 DIAGNOSIS — M62830 Muscle spasm of back: Secondary | ICD-10-CM

## 2020-01-04 DIAGNOSIS — M545 Low back pain, unspecified: Secondary | ICD-10-CM

## 2020-01-04 DIAGNOSIS — M542 Cervicalgia: Secondary | ICD-10-CM | POA: Diagnosis not present

## 2020-01-04 DIAGNOSIS — R262 Difficulty in walking, not elsewhere classified: Secondary | ICD-10-CM | POA: Diagnosis not present

## 2020-01-04 NOTE — Therapy (Signed)
[facility] [address] [place], [place], [address] Phone: [phone]   Fax:  [phone]  Physical Therapy Treatment  Patient Details  Name: EUSTACIA Mcgrath MRN: [id number] Date of Birth: Jul 23, 1933 Referring Provider (PT): [name]   Encounter Date: 01/04/2020  PT End of Session - 01/04/20 1518    PT Start Time  1434    PT Stop Time  1530    PT Time Calculation (min)  56 min    Activity Tolerance  Patient limited by pain    Behavior During Therapy  [facility] for tasks assessed/performed       Past Medical History:  Diagnosis Date  . Allergic rhinitis   . Asthma   . Cataract    Bil/lens implant  . Colon polyp 1999   small polyp  . Diverticulosis   . Dysrhythmia   . Fibromyalgia   . Hyperlipidemia   . Hypothyroidism   . Internal hemorrhoids   . Leg cramps   . Lung nodule    stable LUL 9 mm  . Menopausal syndrome   . Osteoarthritis   . UTI (urinary tract infection)     Past Surgical History:  Procedure Laterality Date  . Abd U/S  11/1998   negative  . Abd U/S  01/2001   gallbladder polyps  . ABDOMINAL HYSTERECTOMY  1975   total-fibroids  . APPENDECTOMY  1975  . BREAST BIOPSY     benign  . BREAST EXCISIONAL BIOPSY Left 1957  . CARDIOVERSION N/A 03/07/2016   Procedure: CARDIOVERSION;  Surgeon: Yates Decamp, MD;  Location: [facility];  Service: Cardiovascular;  Laterality: N/A;  . CATARACT EXTRACTION     bilateral  . CHOLECYSTECTOMY    . COLONOSCOPY  1998   Diverticulosis; polyp\  . COLONOSCOPY  09/2002   Diverticulosis, hem  . COLONOSCOPY  12/2007   diverticulosis, polyp  . DEXA  10/2001   osteopenia  . ELECTROPHYSIOLOGIC STUDY N/A 04/03/2016   Procedure: A-Flutter Ablation;  Surgeon: Will [name], MD;  [facility];  Service: Cardiovascular;  Laterality: N/A;  . ESOPHAGOGASTRODUODENOSCOPY  2004  . Hida scan  01/2001   Negative  . KNEE SURGERY     right knee / 11/2004   . LAMINECTOMY WITH POSTERIOR LATERAL ARTHRODESIS LEVEL 2 N/A 07/31/2017   Procedure: DECOMPRESSIVE LAMINECTOMY LUMABR FOUR-FIVE, LUMBAR FIVE-SACRAL ONE;  Surgeon: Tia Alert, MD;  Location: [facility] OR;  Service: Neurosurgery;  Laterality: N/A;  . laser surgery for glaucoma Left Sep 21, 2014  . ROTATOR CUFF REPAIR     x 2 /right shoulder  . SKIN CANCER EXCISION     pre-melanoma / on face  . TEE WITHOUT CARDIOVERSION N/A 03/07/2016   Procedure: TRANSESOPHAGEAL ECHOCARDIOGRAM (TEE);  Surgeon: Yates Decamp, MD;  Location: [facility] ENDOSCOPY;  Service: Cardiovascular;  Laterality: N/A;  . TOE SURGERY     rt foot     There were no vitals filed for this visit.  Subjective Assessment - 01/04/20 1444    Subjective  Saw MD that did the surgery, will have a second consultaiton and will probably get injections, reports that she is feeling a little better, reports that she is taking her medication more regularly    Currently in Pain?  Yes    Pain Score  5     Pain Location  Back    Pain Orientation  Right;Lower    Pain Relieving Factors  I feel better when I leave  [facility] Adult PT Treatment/Exercise - 01/04/20 0001      Lumbar Exercises: Aerobic   Nustep  level 3 x 8 minutes      Lumbar Exercises: Standing   Other Standing Lumbar Exercises  hip abduction 3#, hip extension 3# 2x10 each      Lumbar Exercises: Seated   Long Arc Quad on Chair  Both;10 reps;3 sets    LAQ on Chair Weights (lbs)  3#    Other Seated Lumbar Exercises  Marching 3#, ball b/n knees squeeze, bosu behind her doing partial sit ups    Other Seated Lumbar Exercises  heel raises, toe raises      Modalities   Modalities  Electrical Stimulation;Moist Heat      Moist Heat Therapy   Number Minutes Moist Heat  15 Minutes    Moist Heat Location  Lumbar Spine      Electrical Stimulation   Electrical Stimulation Location  L/S area    Electrical Stimulation Action  IFC    Electrical Stimulation  Parameters  sitting    Electrical Stimulation Goals  Pain      Manual Therapy   Manual Therapy  Soft tissue mobilization    Soft tissue mobilization  to the right lumbar area and into the right buttock               PT Short Term Goals - 11/16/19 1132      PT SHORT TERM GOAL #1   Title  independent with HEP    Status  Achieved        PT Long Term Goals - 01/04/20 1521      PT LONG TERM GOAL #1   Title  decrease pain 50%    Status  Partially Met            Plan - 01/04/20 1518    Clinical Impression Statement  Patient continues to have LBP, she is going to see MD next week and thinks that she may have injections.  She reports that she is taking her medications more often and having less pain.  She is still very tight in the lumbar area and very tender, she reports that she has limited some of her activities due to the pain    PT Next Visit Plan  slowly add if tolerated exercises    Consulted and Agree with Plan of Care  Patient       Patient will benefit from skilled therapeutic intervention in order to improve the following deficits and impairments:  Abnormal gait, Pain, Improper body mechanics, Postural dysfunction, Increased muscle spasms, Decreased activity tolerance, Decreased endurance, Decreased range of motion, Decreased strength, Difficulty walking, Impaired flexibility  Visit Diagnosis: Acute bilateral low back pain without sciatica  Muscle spasm of back  Cervicalgia  Difficulty in walking, not elsewhere classified     Problem List Patient Active Problem List   Diagnosis Date Noted  . Insomnia 12/15/2019  . Chronic low back pain 10/18/2019  . Positive depression screening 10/18/2019  . Osteoarthritis of right knee 08/02/2019  . Diarrhea 07/03/2018  . Dyspepsia 07/03/2018  . S/P lumbar spinal fusion 07/31/2017  . Right low back pain 03/04/2017  . H/O atrial flutter 03/06/2016  . Obesity 11/06/2015  . Urge incontinence 01/24/2015  .  Estrogen deficiency 10/25/2014  . Lichen planus 12/20/2013  . Encounter for Medicare annual wellness exam 06/17/2013  . Screening mammogram, encounter for 06/15/2012  . Prediabetes 06/03/2011  . HYPERTENSION, BENIGN ESSENTIAL 10/23/2010  . CONSTIPATION  10/23/2010  . HEMATOCHEZIA 10/23/2010  . OSTEOARTHRITIS, HANDS, BILATERAL 01/24/2010  . Hypothyroidism 08/24/2008  . Vitamin D deficiency 06/06/2008  . Hyperlipidemia 06/06/2008  . MENOPAUSAL SYNDROME 03/29/2008  . Osteopenia 03/29/2008  . ALLERGIC RHINITIS 01/25/2008  . IBS 01/25/2008  . OSTEOARTHRITIS 01/25/2008  . FIBROMYALGIA 01/25/2008    Jearld Lesch., PT 01/04/2020, 3:23 PM  [facility] [place] [address] [place], [place], [address] Phone: [phone]   Fax:  [phone]  Name: ADORABELLA RESENDIZ MRN: [id number] Date of Birth: Nov 27, 1932

## 2020-01-06 ENCOUNTER — Ambulatory Visit: Payer: Medicare Other | Admitting: Physical Therapy

## 2020-01-06 ENCOUNTER — Other Ambulatory Visit: Payer: Self-pay

## 2020-01-06 ENCOUNTER — Encounter: Payer: Self-pay | Admitting: Physical Therapy

## 2020-01-06 DIAGNOSIS — M62830 Muscle spasm of back: Secondary | ICD-10-CM | POA: Diagnosis not present

## 2020-01-06 DIAGNOSIS — M542 Cervicalgia: Secondary | ICD-10-CM

## 2020-01-06 DIAGNOSIS — R262 Difficulty in walking, not elsewhere classified: Secondary | ICD-10-CM | POA: Diagnosis not present

## 2020-01-06 DIAGNOSIS — M545 Low back pain, unspecified: Secondary | ICD-10-CM

## 2020-01-06 NOTE — Therapy (Signed)
[place], [place], [address] Phone: [phone]   Fax:  [phone]  Physical Therapy Treatment  Patient Details  Name: Linda Mcgrath MRN: [id number] Date of Birth: 04/11/1933 Referring Provider (PT): [name]   Encounter Date: 01/06/2020  PT End of Session - 01/06/20 1038    Visit Number  22    Date for PT Re-Evaluation  01/20/20    PT Start Time  0955    PT Stop Time  1045    PT Time Calculation (min)  50 min    Activity Tolerance  Patient limited by pain    Behavior During Therapy  [facility] for tasks assessed/performed       Past Medical History:  Diagnosis Date  . Allergic rhinitis   . Asthma   . Cataract    Bil/lens implant  . Colon polyp 1999   small polyp  . Diverticulosis   . Dysrhythmia   . Fibromyalgia   . Hyperlipidemia   . Hypothyroidism   . Internal hemorrhoids   . Leg cramps   . Lung nodule    stable LUL 9 mm  . Menopausal syndrome   . Osteoarthritis   . UTI (urinary tract infection)     Past Surgical History:  Procedure Laterality Date  . Abd U/S  11/1998   negative  . Abd U/S  01/2001   gallbladder polyps  . ABDOMINAL HYSTERECTOMY  1975   total-fibroids  . APPENDECTOMY  1975  . BREAST BIOPSY     benign  . BREAST EXCISIONAL BIOPSY Left 1957  . CARDIOVERSION N/A 03/07/2016   Procedure: CARDIOVERSION;  Surgeon: Adrian Prows, MD;  Location: [facility];  Service: Cardiovascular;  Laterality: N/A;  . CATARACT EXTRACTION     bilateral  . CHOLECYSTECTOMY    . COLONOSCOPY  1998   Diverticulosis; polyp\  . COLONOSCOPY  09/2002   Diverticulosis, hem  . COLONOSCOPY  12/2007   diverticulosis, polyp  . DEXA  10/2001   osteopenia  . ELECTROPHYSIOLOGIC STUDY N/A 04/03/2016   Procedure: A-Flutter Ablation;  Surgeon: Will Meredith Leeds, MD;  Location: [facility];  Service: Cardiovascular;  Laterality: N/A;  . ESOPHAGOGASTRODUODENOSCOPY  2004  . [place] scan   01/2001   Negative  . KNEE SURGERY     right knee / 11/2004  . LAMINECTOMY WITH POSTERIOR LATERAL ARTHRODESIS LEVEL 2 N/A 07/31/2017   Procedure: DECOMPRESSIVE LAMINECTOMY LUMABR FOUR-FIVE, LUMBAR FIVE-SACRAL ONE;  Surgeon: Eustace Moore, MD;  Location: [facility];  Service: Neurosurgery;  Laterality: N/A;  . laser surgery for glaucoma Left Sep 21, 2014  . ROTATOR CUFF REPAIR     x 2 /right shoulder  . SKIN CANCER EXCISION     pre-melanoma / on face  . TEE WITHOUT CARDIOVERSION N/A 03/07/2016   Procedure: TRANSESOPHAGEAL ECHOCARDIOGRAM (TEE);  Surgeon: Adrian Prows, MD;  Location: [facility];  Service: Cardiovascular;  Laterality: N/A;  . TOE SURGERY     rt foot     There were no vitals filed for this visit.  Subjective Assessment - 01/06/20 1000    Subjective  I am doing a little better today, less pain, but I was in her only two days ago.  I would love to do some gardening but I think I will hurt more so I do not do it    Currently in Pain?  Yes    Pain Score  4     Pain Location  Active Problem List   Diagnosis Date Noted  . Insomnia 12/15/2019  . Chronic low back pain 10/18/2019  . Positive depression screening 10/18/2019  .  Osteoarthritis of right knee 08/02/2019  . Diarrhea 07/03/2018  . Dyspepsia 07/03/2018  . S/P lumbar spinal fusion 07/31/2017  . Right low back pain 03/04/2017  . H/O atrial flutter 03/06/2016  . Obesity 11/06/2015  . Urge incontinence 01/24/2015  . Estrogen deficiency 10/25/2014  . Lichen planus 74/94/4967  . Encounter for Medicare annual wellness exam 06/17/2013  . Screening mammogram, encounter for 06/15/2012  . Prediabetes 06/03/2011  . HYPERTENSION, BENIGN ESSENTIAL 10/23/2010  . CONSTIPATION 10/23/2010  . HEMATOCHEZIA 10/23/2010  . OSTEOARTHRITIS, HANDS, BILATERAL 01/24/2010  . Hypothyroidism 08/24/2008  . Vitamin D deficiency 06/06/2008  . Hyperlipidemia 06/06/2008  . MENOPAUSAL SYNDROME 03/29/2008  . Osteopenia 03/29/2008  . ALLERGIC RHINITIS 01/25/2008  . IBS 01/25/2008  . OSTEOARTHRITIS 01/25/2008  . FIBROMYALGIA 01/25/2008    [name], PT 01/06/2020, 10:51 AM  [facility] [address] [place], [place], [address] Phone: [phone]   Fax:  [phone]  Name: Linda Mcgrath MRN: [id number] Date of Birth: 03/16/1933  [place], [place], [address] Phone: [phone]   Fax:  [phone]  Physical Therapy Treatment  Patient Details  Name: Linda Mcgrath MRN: [id number] Date of Birth: 04/11/1933 Referring Provider (PT): [name]   Encounter Date: 01/06/2020  PT End of Session - 01/06/20 1038    Visit Number  22    Date for PT Re-Evaluation  01/20/20    PT Start Time  0955    PT Stop Time  1045    PT Time Calculation (min)  50 min    Activity Tolerance  Patient limited by pain    Behavior During Therapy  [facility] for tasks assessed/performed       Past Medical History:  Diagnosis Date  . Allergic rhinitis   . Asthma   . Cataract    Bil/lens implant  . Colon polyp 1999   small polyp  . Diverticulosis   . Dysrhythmia   . Fibromyalgia   . Hyperlipidemia   . Hypothyroidism   . Internal hemorrhoids   . Leg cramps   . Lung nodule    stable LUL 9 mm  . Menopausal syndrome   . Osteoarthritis   . UTI (urinary tract infection)     Past Surgical History:  Procedure Laterality Date  . Abd U/S  11/1998   negative  . Abd U/S  01/2001   gallbladder polyps  . ABDOMINAL HYSTERECTOMY  1975   total-fibroids  . APPENDECTOMY  1975  . BREAST BIOPSY     benign  . BREAST EXCISIONAL BIOPSY Left 1957  . CARDIOVERSION N/A 03/07/2016   Procedure: CARDIOVERSION;  Surgeon: Adrian Prows, MD;  Location: [facility];  Service: Cardiovascular;  Laterality: N/A;  . CATARACT EXTRACTION     bilateral  . CHOLECYSTECTOMY    . COLONOSCOPY  1998   Diverticulosis; polyp\  . COLONOSCOPY  09/2002   Diverticulosis, hem  . COLONOSCOPY  12/2007   diverticulosis, polyp  . DEXA  10/2001   osteopenia  . ELECTROPHYSIOLOGIC STUDY N/A 04/03/2016   Procedure: A-Flutter Ablation;  Surgeon: Will Meredith Leeds, MD;  Location: [facility];  Service: Cardiovascular;  Laterality: N/A;  . ESOPHAGOGASTRODUODENOSCOPY  2004  . Hida scan   01/2001   Negative  . KNEE SURGERY     right knee / 11/2004  . LAMINECTOMY WITH POSTERIOR LATERAL ARTHRODESIS LEVEL 2 N/A 07/31/2017   Procedure: DECOMPRESSIVE LAMINECTOMY LUMABR FOUR-FIVE, LUMBAR FIVE-SACRAL ONE;  Surgeon: Eustace Moore, MD;  Location: [facility];  Service: Neurosurgery;  Laterality: N/A;  . laser surgery for glaucoma Left Sep 21, 2014  . ROTATOR CUFF REPAIR     x 2 /right shoulder  . SKIN CANCER EXCISION     pre-melanoma / on face  . TEE WITHOUT CARDIOVERSION N/A 03/07/2016   Procedure: TRANSESOPHAGEAL ECHOCARDIOGRAM (TEE);  Surgeon: Adrian Prows, MD;  Location: [facility];  Service: Cardiovascular;  Laterality: N/A;  . TOE SURGERY     rt foot     There were no vitals filed for this visit.  Subjective Assessment - 01/06/20 1000    Subjective  I am doing a little better today, less pain, but I was in her only two days ago.  I would love to do some gardening but I think I will hurt more so I do not do it    Currently in Pain?  Yes    Pain Score  4     Pain Location

## 2020-01-11 ENCOUNTER — Other Ambulatory Visit: Payer: Self-pay

## 2020-01-11 ENCOUNTER — Ambulatory Visit: Payer: Medicare Other | Admitting: Physical Therapy

## 2020-01-11 ENCOUNTER — Encounter: Payer: Self-pay | Admitting: Physical Therapy

## 2020-01-11 DIAGNOSIS — M542 Cervicalgia: Secondary | ICD-10-CM

## 2020-01-11 DIAGNOSIS — M545 Low back pain, unspecified: Secondary | ICD-10-CM

## 2020-01-11 DIAGNOSIS — M62830 Muscle spasm of back: Secondary | ICD-10-CM

## 2020-01-11 DIAGNOSIS — R262 Difficulty in walking, not elsewhere classified: Secondary | ICD-10-CM

## 2020-01-11 NOTE — Therapy (Signed)
Encounter for Medicare annual wellness exam 06/17/2013  . Screening mammogram, encounter for 06/15/2012  . Prediabetes 06/03/2011  . HYPERTENSION, BENIGN ESSENTIAL 10/23/2010  . CONSTIPATION 10/23/2010  . HEMATOCHEZIA 10/23/2010  . OSTEOARTHRITIS, HANDS, BILATERAL 01/24/2010  . Hypothyroidism 08/24/2008  . Vitamin D deficiency 06/06/2008  . Hyperlipidemia 06/06/2008  . MENOPAUSAL SYNDROME 03/29/2008  . Osteopenia 03/29/2008  . ALLERGIC RHINITIS 01/25/2008  . IBS 01/25/2008  . OSTEOARTHRITIS 01/25/2008  . FIBROMYALGIA 01/25/2008    [name], PT 01/11/2020, 3:14 PM  [place] [place], [place], [address] Phone: [phone]   Fax:  [phone]  Name: Linda Mcgrath MRN: [id number] Date of Birth: 05-12-1933  [place], [place], [address] Phone: [phone]   Fax:  [phone]  Physical Therapy Treatment  Patient Details  Name: GLENORA MOROCHO MRN: [id number] Date of Birth: Nov 19, 1932 Referring Provider (PT): [name]   Encounter Date: 01/11/2020  PT End of Session - 01/11/20 1511    Visit Number  23    Date for PT Re-Evaluation  01/20/20    PT Start Time  1350    PT Stop Time  1440    PT Time Calculation (min)  50 min    Activity Tolerance  Patient tolerated treatment well    Behavior During Therapy  [facility] for tasks assessed/performed       Past Medical History:  Diagnosis Date  . Allergic rhinitis   . Asthma   . Cataract    Bil/lens implant  . Colon polyp 1999   small polyp  . Diverticulosis   . Dysrhythmia   . Fibromyalgia   . Hyperlipidemia   . Hypothyroidism   . Internal hemorrhoids   . Leg cramps   . Lung nodule    stable LUL 9 mm  . Menopausal syndrome   . Osteoarthritis   . UTI (urinary tract infection)     Past Surgical History:  Procedure Laterality Date  . Abd U/S  11/1998   negative  . Abd U/S  01/2001   gallbladder polyps  . ABDOMINAL HYSTERECTOMY  1975   total-fibroids  . APPENDECTOMY  1975  . BREAST BIOPSY     benign  . BREAST EXCISIONAL BIOPSY Left 1957  . CARDIOVERSION N/A 03/07/2016   Procedure: CARDIOVERSION;  Surgeon: Adrian Prows, MD;  Location: [facility];  Service: Cardiovascular;  Laterality: N/A;  . CATARACT EXTRACTION     bilateral  . CHOLECYSTECTOMY    . COLONOSCOPY  1998   Diverticulosis; polyp\  . COLONOSCOPY  09/2002   Diverticulosis, hem  . COLONOSCOPY  12/2007   diverticulosis, polyp  . DEXA  10/2001   osteopenia  . ELECTROPHYSIOLOGIC STUDY N/A 04/03/2016   Procedure: A-Flutter Ablation;  Surgeon: Will Meredith Leeds, MD;  Location: [facility];  Service: Cardiovascular;  Laterality: N/A;  . ESOPHAGOGASTRODUODENOSCOPY  2004  . [place]  scan  01/2001   Negative  . KNEE SURGERY     right knee / 11/2004  . LAMINECTOMY WITH POSTERIOR LATERAL ARTHRODESIS LEVEL 2 N/A 07/31/2017   Procedure: DECOMPRESSIVE LAMINECTOMY LUMABR FOUR-FIVE, LUMBAR FIVE-SACRAL ONE;  Surgeon: Eustace Moore, MD;  Location: [facility];  Service: Neurosurgery;  Laterality: N/A;  . laser surgery for glaucoma Left Sep 21, 2014  . ROTATOR CUFF REPAIR     x 2 /right shoulder  . SKIN CANCER EXCISION     pre-melanoma / on face  . TEE WITHOUT CARDIOVERSION N/A 03/07/2016   Procedure: TRANSESOPHAGEAL ECHOCARDIOGRAM (TEE);  Surgeon: Adrian Prows, MD;  Location: [facility];  Service: Cardiovascular;  Laterality: N/A;  . TOE SURGERY     rt foot     There were no vitals filed for this visit.  Subjective Assessment - 01/11/20 1411    Subjective  REports feeling a little better.    Currently in Pain?  Yes    Pain Score  3     Pain Location  Back    Pain Orientation  Right;Lower    Pain Descriptors / Indicators  Sore  Encounter for Medicare annual wellness exam 06/17/2013  . Screening mammogram, encounter for 06/15/2012  . Prediabetes 06/03/2011  . HYPERTENSION, BENIGN ESSENTIAL 10/23/2010  . CONSTIPATION 10/23/2010  . HEMATOCHEZIA 10/23/2010  . OSTEOARTHRITIS, HANDS, BILATERAL 01/24/2010  . Hypothyroidism 08/24/2008  . Vitamin D deficiency 06/06/2008  . Hyperlipidemia 06/06/2008  . MENOPAUSAL SYNDROME 03/29/2008  . Osteopenia 03/29/2008  . ALLERGIC RHINITIS 01/25/2008  . IBS 01/25/2008  . OSTEOARTHRITIS 01/25/2008  . FIBROMYALGIA 01/25/2008    [name], PT 01/11/2020, 3:14 PM  [place] [place], [place], [address] Phone: [phone]   Fax:  [phone]  Name: Linda Mcgrath MRN: [id number] Date of Birth: 05-12-1933

## 2020-01-13 ENCOUNTER — Other Ambulatory Visit: Payer: Self-pay

## 2020-01-13 ENCOUNTER — Encounter: Payer: Self-pay | Admitting: Physical Therapy

## 2020-01-13 ENCOUNTER — Ambulatory Visit: Payer: Medicare Other | Admitting: Physical Therapy

## 2020-01-13 DIAGNOSIS — M545 Low back pain, unspecified: Secondary | ICD-10-CM

## 2020-01-13 DIAGNOSIS — R262 Difficulty in walking, not elsewhere classified: Secondary | ICD-10-CM

## 2020-01-13 DIAGNOSIS — M542 Cervicalgia: Secondary | ICD-10-CM | POA: Diagnosis not present

## 2020-01-13 DIAGNOSIS — M47816 Spondylosis without myelopathy or radiculopathy, lumbar region: Secondary | ICD-10-CM | POA: Diagnosis not present

## 2020-01-13 DIAGNOSIS — M62830 Muscle spasm of back: Secondary | ICD-10-CM | POA: Diagnosis not present

## 2020-01-13 NOTE — Therapy (Signed)
[facility] [address] [place], [place], [address] Phone: [phone]   Fax:  [phone]  Physical Therapy Treatment  Patient Details  Name: Linda Mcgrath MRN: [id number] Date of Birth: 09/08/1932 Referring Provider (PT): [name]   Encounter Date: 01/13/2020  PT End of Session - 01/13/20 1439    Visit Number  24    Date for PT Re-Evaluation  01/20/20    PT Start Time  1352    PT Stop Time  1441    PT Time Calculation (min)  49 min    Activity Tolerance  Patient tolerated treatment well    Behavior During Therapy  [facility] for tasks assessed/performed       Past Medical History:  Diagnosis Date  . Allergic rhinitis   . Asthma   . Cataract    Bil/lens implant  . Colon polyp 1999   small polyp  . Diverticulosis   . Dysrhythmia   . Fibromyalgia   . Hyperlipidemia   . Hypothyroidism   . Internal hemorrhoids   . Leg cramps   . Lung nodule    stable LUL 9 mm  . Menopausal syndrome   . Osteoarthritis   . UTI (urinary tract infection)     Past Surgical History:  Procedure Laterality Date  . Abd U/S  11/1998   negative  . Abd U/S  01/2001   gallbladder polyps  . ABDOMINAL HYSTERECTOMY  1975   total-fibroids  . APPENDECTOMY  1975  . BREAST BIOPSY     benign  . BREAST EXCISIONAL BIOPSY Left 1957  . CARDIOVERSION N/A 03/07/2016   Procedure: CARDIOVERSION;  Surgeon: Yates Decamp, MD;  Location: [facility];  Service: Cardiovascular;  Laterality: N/A;  . CATARACT EXTRACTION     bilateral  . CHOLECYSTECTOMY    . COLONOSCOPY  1998   Diverticulosis; polyp\  . COLONOSCOPY  09/2002   Diverticulosis, hem  . COLONOSCOPY  12/2007   diverticulosis, polyp  . DEXA  10/2001   osteopenia  . ELECTROPHYSIOLOGIC STUDY N/A 04/03/2016   Procedure: A-Flutter Ablation;  Surgeon: Will [name], MD;  [facility];  Service: Cardiovascular;  Laterality: N/A;  . ESOPHAGOGASTRODUODENOSCOPY  2004  . Hida  scan  01/2001   Negative  . KNEE SURGERY     right knee / 11/2004  . LAMINECTOMY WITH POSTERIOR LATERAL ARTHRODESIS LEVEL 2 N/A 07/31/2017   Procedure: DECOMPRESSIVE LAMINECTOMY LUMABR FOUR-FIVE, LUMBAR FIVE-SACRAL ONE;  Surgeon: Tia Alert, MD;  Location: [facility];  Service: Neurosurgery;  Laterality: N/A;  . laser surgery for glaucoma Left Sep 21, 2014  . ROTATOR CUFF REPAIR     x 2 /right shoulder  . SKIN CANCER EXCISION     pre-melanoma / on face  . TEE WITHOUT CARDIOVERSION N/A 03/07/2016   Procedure: TRANSESOPHAGEAL ECHOCARDIOGRAM (TEE);  Surgeon: Yates Decamp, MD;  Location: [facility];  Service: Cardiovascular;  Laterality: N/A;  . TOE SURGERY     rt foot     There were no vitals filed for this visit.  Subjective Assessment - 01/13/20 1350    Subjective  reports tha she saw the MD this morning, will have injections in her back on June 3 or 4th.  Reports that she is tired but doing a little better pain wise    Currently in Pain?  Yes    Pain Score  3     Pain Location  Back    Pain Orientation  Right;Lower    Pain Relieving Factors  treatment has been helping                        [facility] Adult PT Treatment/Exercise - 01/13/20 0001      Lumbar Exercises: Aerobic   Nustep  level 4 x 8 minutes      Lumbar Exercises: Seated   Long Arc Quad on Chair  Both;10 reps;3 sets    LAQ on Chair Weights (lbs)  3#    Other Seated Lumbar Exercises  Marching 3#, ball b/n knees squeeze, ball in lap isometric abs    Other Seated Lumbar Exercises  heel raises, toe raises, on airex pelvic mobility      Lumbar Exercises: Supine   Pelvic Tilt  5 reps;5 seconds    Clam  15 reps    Other Supine Lumbar Exercises  feet on ball K2C, trunk rotation, small bridges, isometric abs      Manual Therapy   Manual Therapy  Soft tissue mobilization    Manual therapy comments  HS and calf, light piriformis    Soft tissue mobilization  to the right lumbar area and into the right buttock                PT Short Term Goals - 11/16/19 1132      PT SHORT TERM GOAL #1   Title  independent with HEP    Status  Achieved        PT Long Term Goals - 01/11/20 1514      PT LONG TERM GOAL #1   Title  decrease pain 50%    Status  Partially Met      PT LONG TERM GOAL #2   Title  increase lumbar ROM 25%    Status  Partially Met            Plan - 01/13/20 1440    Clinical Impression Statement  Patient  is going to have injections in 2 weeks, she is doing better overall but is still very sore and tight in the lumbar area and into the right buttock.  She has been a little more active with shopping without much increase of pain    PT Next Visit Plan  will continue up to injection    Consulted and Agree with Plan of Care  Patient       Patient will benefit from skilled therapeutic intervention in order to improve the following deficits and impairments:  Abnormal gait, Pain, Improper body mechanics, Postural dysfunction, Increased muscle spasms, Decreased activity tolerance, Decreased endurance, Decreased range of motion, Decreased strength, Difficulty walking, Impaired flexibility  Visit Diagnosis: Acute bilateral low back pain without sciatica  Muscle spasm of back  Difficulty in walking, not elsewhere classified  Cervicalgia     Problem List Patient Active Problem List   Diagnosis Date Noted  . Insomnia 12/15/2019  . Chronic low back pain 10/18/2019  . Positive depression screening 10/18/2019  . Osteoarthritis of right knee 08/02/2019  . Diarrhea 07/03/2018  . Dyspepsia 07/03/2018  . S/P lumbar spinal fusion 07/31/2017  . Right low back pain 03/04/2017  . H/O atrial flutter 03/06/2016  . Obesity 11/06/2015  . Urge incontinence 01/24/2015  . Estrogen deficiency 10/25/2014  . Lichen planus 12/20/2013  . Encounter for Medicare annual wellness exam 06/17/2013  . Screening mammogram, encounter for 06/15/2012  . Prediabetes 06/03/2011  .  HYPERTENSION, BENIGN ESSENTIAL 10/23/2010  . CONSTIPATION 10/23/2010  .  HEMATOCHEZIA 10/23/2010  . OSTEOARTHRITIS, HANDS, BILATERAL 01/24/2010  . Hypothyroidism 08/24/2008  . Vitamin D deficiency 06/06/2008  . Hyperlipidemia 06/06/2008  . MENOPAUSAL SYNDROME 03/29/2008  . Osteopenia 03/29/2008  . ALLERGIC RHINITIS 01/25/2008  . IBS 01/25/2008  . OSTEOARTHRITIS 01/25/2008  . FIBROMYALGIA 01/25/2008    Jearld Lesch., PT 01/13/2020, 2:43 PM  [facility] [address] [place], [place], [address] Phone: [phone]   Fax:  [phone]  Name: LISE KOVALCHIK MRN: [id number] Date of Birth: [birth year]

## 2020-01-18 ENCOUNTER — Ambulatory Visit: Payer: Medicare Other | Admitting: Physical Therapy

## 2020-01-18 ENCOUNTER — Encounter: Payer: Self-pay | Admitting: Physical Therapy

## 2020-01-18 ENCOUNTER — Other Ambulatory Visit: Payer: Self-pay

## 2020-01-18 DIAGNOSIS — M542 Cervicalgia: Secondary | ICD-10-CM

## 2020-01-18 DIAGNOSIS — R262 Difficulty in walking, not elsewhere classified: Secondary | ICD-10-CM | POA: Diagnosis not present

## 2020-01-18 DIAGNOSIS — M545 Low back pain, unspecified: Secondary | ICD-10-CM

## 2020-01-18 DIAGNOSIS — M62830 Muscle spasm of back: Secondary | ICD-10-CM | POA: Diagnosis not present

## 2020-01-18 NOTE — Therapy (Signed)
01/25/2008    [name], PT 01/18/2020, 2:30 PM  [address] [place], [place], [address] Phone: [phone]   Fax:  [phone]  Name: Linda Mcgrath MRN: [id number] Date of Birth: [birth year]  [facility] [place], [place], [address] Phone: [phone]   Fax:  [phone]  Physical Therapy Treatment  Patient Details  Name: Linda Mcgrath MRN: [id number] Date of Birth: 1933-03-11 Referring Provider (PT): [name]   Encounter Date: 01/18/2020  PT End of Session - 01/18/20 1427    Visit Number  25    Date for PT Re-Evaluation  01/20/20    PT Start Time  4944    PT Stop Time  1436    PT Time Calculation (min)  48 min    Activity Tolerance  Patient tolerated treatment well    Behavior During Therapy  [facility] for tasks assessed/performed       Past Medical History:  Diagnosis Date  . Allergic rhinitis   . Asthma   . Cataract    Bil/lens implant  . Colon polyp 1999   small polyp  . Diverticulosis   . Dysrhythmia   . Fibromyalgia   . Hyperlipidemia   . Hypothyroidism   . Internal hemorrhoids   . Leg cramps   . Lung nodule    stable LUL 9 mm  . Menopausal syndrome   . Osteoarthritis   . UTI (urinary tract infection)     Past Surgical History:  Procedure Laterality Date  . Abd U/S  11/1998   negative  . Abd U/S  01/2001   gallbladder polyps  . ABDOMINAL HYSTERECTOMY  1975   total-fibroids  . APPENDECTOMY  1975  . BREAST BIOPSY     benign  . BREAST EXCISIONAL BIOPSY Left 1957  . CARDIOVERSION N/A 03/07/2016   Procedure: CARDIOVERSION;  Surgeon: Adrian Prows, MD;  Location: [facility];  Service: Cardiovascular;  Laterality: N/A;  . CATARACT EXTRACTION     bilateral  . CHOLECYSTECTOMY    . COLONOSCOPY  1998   Diverticulosis; polyp\  . COLONOSCOPY  09/2002   Diverticulosis, hem  . COLONOSCOPY  12/2007   diverticulosis, polyp  . DEXA  10/2001   osteopenia  . ELECTROPHYSIOLOGIC STUDY N/A 04/03/2016   Procedure: A-Flutter Ablation;  Surgeon: Will Meredith Leeds, MD;  Location: [place];  Service: Cardiovascular;  Laterality: N/A;  . ESOPHAGOGASTRODUODENOSCOPY  2004  . Hida  scan  01/2001   Negative  . KNEE SURGERY     right knee / 11/2004  . LAMINECTOMY WITH POSTERIOR LATERAL ARTHRODESIS LEVEL 2 N/A 07/31/2017   Procedure: DECOMPRESSIVE LAMINECTOMY LUMABR FOUR-FIVE, LUMBAR FIVE-SACRAL ONE;  Surgeon: Eustace Moore, MD;  Location: [facility];  Service: Neurosurgery;  Laterality: N/A;  . laser surgery for glaucoma Left Sep 21, 2014  . ROTATOR CUFF REPAIR     x 2 /right shoulder  . SKIN CANCER EXCISION     pre-melanoma / on face  . TEE WITHOUT CARDIOVERSION N/A 03/07/2016   Procedure: TRANSESOPHAGEAL ECHOCARDIOGRAM (TEE);  Surgeon: Adrian Prows, MD;  Location: [facility];  Service: Cardiovascular;  Laterality: N/A;  . TOE SURGERY     rt foot     There were no vitals filed for this visit.  Subjective Assessment - 01/18/20 1349    Subjective  Patient doing okay, reports that she tried to do some things int he yard and has been having cramps in the feet    Currently in Pain?  Yes    Pain Score  5     Pain Location  Back    Pain Orientation  Right;Lower    Aggravating Factors   activity  [facility] [place], [place], [address] Phone: [phone]   Fax:  [phone]  Physical Therapy Treatment  Patient Details  Name: Linda Mcgrath MRN: [id number] Date of Birth: 1933-03-11 Referring Provider (PT): [name]   Encounter Date: 01/18/2020  PT End of Session - 01/18/20 1427    Visit Number  25    Date for PT Re-Evaluation  01/20/20    PT Start Time  4944    PT Stop Time  1436    PT Time Calculation (min)  48 min    Activity Tolerance  Patient tolerated treatment well    Behavior During Therapy  [facility] for tasks assessed/performed       Past Medical History:  Diagnosis Date  . Allergic rhinitis   . Asthma   . Cataract    Bil/lens implant  . Colon polyp 1999   small polyp  . Diverticulosis   . Dysrhythmia   . Fibromyalgia   . Hyperlipidemia   . Hypothyroidism   . Internal hemorrhoids   . Leg cramps   . Lung nodule    stable LUL 9 mm  . Menopausal syndrome   . Osteoarthritis   . UTI (urinary tract infection)     Past Surgical History:  Procedure Laterality Date  . Abd U/S  11/1998   negative  . Abd U/S  01/2001   gallbladder polyps  . ABDOMINAL HYSTERECTOMY  1975   total-fibroids  . APPENDECTOMY  1975  . BREAST BIOPSY     benign  . BREAST EXCISIONAL BIOPSY Left 1957  . CARDIOVERSION N/A 03/07/2016   Procedure: CARDIOVERSION;  Surgeon: Adrian Prows, MD;  Location: [facility];  Service: Cardiovascular;  Laterality: N/A;  . CATARACT EXTRACTION     bilateral  . CHOLECYSTECTOMY    . COLONOSCOPY  1998   Diverticulosis; polyp\  . COLONOSCOPY  09/2002   Diverticulosis, hem  . COLONOSCOPY  12/2007   diverticulosis, polyp  . DEXA  10/2001   osteopenia  . ELECTROPHYSIOLOGIC STUDY N/A 04/03/2016   Procedure: A-Flutter Ablation;  Surgeon: Will Meredith Leeds, MD;  Location: [place] [facility];  Service: Cardiovascular;  Laterality: N/A;  . ESOPHAGOGASTRODUODENOSCOPY  2004  . Hida  scan  01/2001   Negative  . KNEE SURGERY     right knee / 11/2004  . LAMINECTOMY WITH POSTERIOR LATERAL ARTHRODESIS LEVEL 2 N/A 07/31/2017   Procedure: DECOMPRESSIVE LAMINECTOMY LUMABR FOUR-FIVE, LUMBAR FIVE-SACRAL ONE;  Surgeon: Eustace Moore, MD;  Location: [facility];  Service: Neurosurgery;  Laterality: N/A;  . laser surgery for glaucoma Left Sep 21, 2014  . ROTATOR CUFF REPAIR     x 2 /right shoulder  . SKIN CANCER EXCISION     pre-melanoma / on face  . TEE WITHOUT CARDIOVERSION N/A 03/07/2016   Procedure: TRANSESOPHAGEAL ECHOCARDIOGRAM (TEE);  Surgeon: Adrian Prows, MD;  Location: [facility];  Service: Cardiovascular;  Laterality: N/A;  . TOE SURGERY     rt foot     There were no vitals filed for this visit.  Subjective Assessment - 01/18/20 1349    Subjective  Patient doing okay, reports that she tried to do some things int he yard and has been having cramps in the feet    Currently in Pain?  Yes    Pain Score  5     Pain Location  Back    Pain Orientation  Right;Lower    Aggravating Factors   activity

## 2020-01-20 ENCOUNTER — Encounter: Payer: Self-pay | Admitting: Physical Therapy

## 2020-01-20 ENCOUNTER — Ambulatory Visit: Payer: Medicare Other | Admitting: Physical Therapy

## 2020-01-20 ENCOUNTER — Other Ambulatory Visit: Payer: Self-pay

## 2020-01-20 DIAGNOSIS — M62830 Muscle spasm of back: Secondary | ICD-10-CM

## 2020-01-20 DIAGNOSIS — M545 Low back pain, unspecified: Secondary | ICD-10-CM

## 2020-01-20 DIAGNOSIS — R262 Difficulty in walking, not elsewhere classified: Secondary | ICD-10-CM

## 2020-01-20 DIAGNOSIS — M542 Cervicalgia: Secondary | ICD-10-CM | POA: Diagnosis not present

## 2020-01-20 NOTE — Therapy (Signed)
[facility] [place], [place], [address] Phone: [phone]   Fax:  [phone]  Physical Therapy Treatment  Patient Details  Name: Linda Mcgrath MRN: [id number] Date of Birth: [birth year] Referring Provider (PT): [name]   Encounter Date: 01/20/2020  PT End of Session - 01/20/20 1102    Visit Number  26    Date for PT Re-Evaluation  02/24/20    PT Start Time  1000    PT Stop Time  1100    PT Time Calculation (min)  60 min    Activity Tolerance  Patient tolerated treatment well;Patient limited by pain    Behavior During Therapy  [facility] for tasks assessed/performed       Past Medical History:  Diagnosis Date  . Allergic rhinitis   . Asthma   . Cataract    Bil/lens implant  . Colon polyp 1999   small polyp  . Diverticulosis   . Dysrhythmia   . Fibromyalgia   . Hyperlipidemia   . Hypothyroidism   . Internal hemorrhoids   . Leg cramps   . Lung nodule    stable LUL 9 mm  . Menopausal syndrome   . Osteoarthritis   . UTI (urinary tract infection)     Past Surgical History:  Procedure Laterality Date  . Abd U/S  11/1998   negative  . Abd U/S  01/2001   gallbladder polyps  . ABDOMINAL HYSTERECTOMY  1975   total-fibroids  . APPENDECTOMY  1975  . BREAST BIOPSY     benign  . BREAST EXCISIONAL BIOPSY Left 1957  . CARDIOVERSION N/A 03/07/2016   Procedure: CARDIOVERSION;  Surgeon: Adrian Prows, MD;  Location: [place];  Service: Cardiovascular;  Laterality: N/A;  . CATARACT EXTRACTION     bilateral  . CHOLECYSTECTOMY    . COLONOSCOPY  1998   Diverticulosis; polyp\  . COLONOSCOPY  09/2002   Diverticulosis, hem  . COLONOSCOPY  12/2007   diverticulosis, polyp  . DEXA  10/2001   osteopenia  . ELECTROPHYSIOLOGIC STUDY N/A 04/03/2016   Procedure: A-Flutter Ablation;  Surgeon: Will Meredith Leeds, MD;  Location: [place];  Service: Cardiovascular;  Laterality: N/A;  .  ESOPHAGOGASTRODUODENOSCOPY  2004  . Hida scan  01/2001   Negative  . KNEE SURGERY     right knee / 11/2004  . LAMINECTOMY WITH POSTERIOR LATERAL ARTHRODESIS LEVEL 2 N/A 07/31/2017   Procedure: DECOMPRESSIVE LAMINECTOMY LUMABR FOUR-FIVE, LUMBAR FIVE-SACRAL ONE;  Surgeon: Eustace Moore, MD;  Location: [place];  Service: Neurosurgery;  Laterality: N/A;  . laser surgery for glaucoma Left Sep 21, 2014  . ROTATOR CUFF REPAIR     x 2 /right shoulder  . SKIN CANCER EXCISION     pre-melanoma / on face  . TEE WITHOUT CARDIOVERSION N/A 03/07/2016   Procedure: TRANSESOPHAGEAL ECHOCARDIOGRAM (TEE);  Surgeon: Adrian Prows, MD;  Location: [place];  Service: Cardiovascular;  Laterality: N/A;  . TOE SURGERY     rt foot     There were no vitals filed for this visit.  Subjective Assessment - 01/20/20 1021    Subjective  reports some increased pain, she reports that she has company coming and I have been cleaning and putting things up a lot of bending and lifting    Currently in Pain?  Yes    Pain Score  6     Pain Location  Back    Pain Orientation  Right  mechanics, Postural dysfunction, Increased muscle spasms, Decreased activity tolerance, Decreased endurance, Decreased range of motion, Decreased strength,  Difficulty walking, Impaired flexibility  Visit Diagnosis: Acute bilateral low back pain without sciatica - Plan: PT plan of care cert/re-cert  Muscle spasm of back - Plan: PT plan of care cert/re-cert  Difficulty in walking, not elsewhere classified - Plan: PT plan of care cert/re-cert  Cervicalgia - Plan: PT plan of care cert/re-cert     Problem List Patient Active Problem List   Diagnosis Date Noted  . Insomnia 12/15/2019  . Chronic low back pain 10/18/2019  . Positive depression screening 10/18/2019  . Osteoarthritis of right knee 08/02/2019  . Diarrhea 07/03/2018  . Dyspepsia 07/03/2018  . S/P lumbar spinal fusion 07/31/2017  . Right low back pain 03/04/2017  . H/O atrial flutter 03/06/2016  . Obesity 11/06/2015  . Urge incontinence 01/24/2015  . Estrogen deficiency 10/25/2014  . Lichen planus 81/19/1478  . Encounter for Medicare annual wellness exam 06/17/2013  . Screening mammogram, encounter for 06/15/2012  . Prediabetes 06/03/2011  . HYPERTENSION, BENIGN ESSENTIAL 10/23/2010  . CONSTIPATION 10/23/2010  . HEMATOCHEZIA 10/23/2010  . OSTEOARTHRITIS, HANDS, BILATERAL 01/24/2010  . Hypothyroidism 08/24/2008  . Vitamin D deficiency 06/06/2008  . Hyperlipidemia 06/06/2008  . MENOPAUSAL SYNDROME 03/29/2008  . Osteopenia 03/29/2008  . ALLERGIC RHINITIS 01/25/2008  . IBS 01/25/2008  . OSTEOARTHRITIS 01/25/2008  . FIBROMYALGIA 01/25/2008    [name], PT 01/20/2020, 11:12 AM  [facility] [address], [place], [address] Phone: [phone]   Fax:  [phone]  Name: SHAQUILLE MURDY MRN: [id number] Date of Birth: [birth year]  [facility] [place], [place], [address] Phone: [phone]   Fax:  [phone]  Physical Therapy Treatment  Patient Details  Name: Linda Mcgrath MRN: [id number] Date of Birth: [birth year] Referring Provider (PT): [name]   Encounter Date: 01/20/2020  PT End of Session - 01/20/20 1102    Visit Number  26    Date for PT Re-Evaluation  02/24/20    PT Start Time  1000    PT Stop Time  1100    PT Time Calculation (min)  60 min    Activity Tolerance  Patient tolerated treatment well;Patient limited by pain    Behavior During Therapy  [facility] for tasks assessed/performed       Past Medical History:  Diagnosis Date  . Allergic rhinitis   . Asthma   . Cataract    Bil/lens implant  . Colon polyp 1999   small polyp  . Diverticulosis   . Dysrhythmia   . Fibromyalgia   . Hyperlipidemia   . Hypothyroidism   . Internal hemorrhoids   . Leg cramps   . Lung nodule    stable LUL 9 mm  . Menopausal syndrome   . Osteoarthritis   . UTI (urinary tract infection)     Past Surgical History:  Procedure Laterality Date  . Abd U/S  11/1998   negative  . Abd U/S  01/2001   gallbladder polyps  . ABDOMINAL HYSTERECTOMY  1975   total-fibroids  . APPENDECTOMY  1975  . BREAST BIOPSY     benign  . BREAST EXCISIONAL BIOPSY Left 1957  . CARDIOVERSION N/A 03/07/2016   Procedure: CARDIOVERSION;  Surgeon: Adrian Prows, MD;  Location: [place];  Service: Cardiovascular;  Laterality: N/A;  . CATARACT EXTRACTION     bilateral  . CHOLECYSTECTOMY    . COLONOSCOPY  1998   Diverticulosis; polyp\  . COLONOSCOPY  09/2002   Diverticulosis, hem  . COLONOSCOPY  12/2007   diverticulosis, polyp  . DEXA  10/2001   osteopenia  . ELECTROPHYSIOLOGIC STUDY N/A 04/03/2016   Procedure: A-Flutter Ablation;  Surgeon: Will Meredith Leeds, MD;  Location: [place] [facility];  Service: Cardiovascular;  Laterality: N/A;  .  ESOPHAGOGASTRODUODENOSCOPY  2004  . Hida scan  01/2001   Negative  . KNEE SURGERY     right knee / 11/2004  . LAMINECTOMY WITH POSTERIOR LATERAL ARTHRODESIS LEVEL 2 N/A 07/31/2017   Procedure: DECOMPRESSIVE LAMINECTOMY LUMABR FOUR-FIVE, LUMBAR FIVE-SACRAL ONE;  Surgeon: Eustace Moore, MD;  Location: [place];  Service: Neurosurgery;  Laterality: N/A;  . laser surgery for glaucoma Left Sep 21, 2014  . ROTATOR CUFF REPAIR     x 2 /right shoulder  . SKIN CANCER EXCISION     pre-melanoma / on face  . TEE WITHOUT CARDIOVERSION N/A 03/07/2016   Procedure: TRANSESOPHAGEAL ECHOCARDIOGRAM (TEE);  Surgeon: Adrian Prows, MD;  Location: [facility];  Service: Cardiovascular;  Laterality: N/A;  . TOE SURGERY     rt foot     There were no vitals filed for this visit.  Subjective Assessment - 01/20/20 1021    Subjective  reports some increased pain, she reports that she has company coming and I have been cleaning and putting things up a lot of bending and lifting    Currently in Pain?  Yes    Pain Score  6     Pain Location  Back    Pain Orientation  Right

## 2020-01-26 ENCOUNTER — Ambulatory Visit: Payer: Medicare Other

## 2020-01-26 DIAGNOSIS — M47816 Spondylosis without myelopathy or radiculopathy, lumbar region: Secondary | ICD-10-CM | POA: Diagnosis not present

## 2020-02-02 ENCOUNTER — Ambulatory Visit
Admission: RE | Admit: 2020-02-02 | Discharge: 2020-02-02 | Disposition: A | Discharge status: Home / Self Care | Payer: Medicare Other | Source: Ambulatory Visit | Attending: Family Medicine | Admitting: Family Medicine

## 2020-02-02 ENCOUNTER — Other Ambulatory Visit: Payer: Self-pay

## 2020-02-02 DIAGNOSIS — Z1231 Encounter for screening mammogram for malignant neoplasm of breast: Secondary | ICD-10-CM

## 2020-02-08 ENCOUNTER — Other Ambulatory Visit: Payer: Self-pay

## 2020-02-08 ENCOUNTER — Ambulatory Visit: Payer: Medicare Other | Attending: Orthopaedic Surgery | Admitting: Physical Therapy

## 2020-02-08 ENCOUNTER — Encounter: Payer: Self-pay | Admitting: Physical Therapy

## 2020-02-08 DIAGNOSIS — M545 Low back pain, unspecified: Secondary | ICD-10-CM

## 2020-02-08 DIAGNOSIS — R262 Difficulty in walking, not elsewhere classified: Secondary | ICD-10-CM | POA: Diagnosis not present

## 2020-02-08 DIAGNOSIS — M542 Cervicalgia: Secondary | ICD-10-CM

## 2020-02-08 DIAGNOSIS — M62830 Muscle spasm of back: Secondary | ICD-10-CM | POA: Diagnosis not present

## 2020-02-08 NOTE — Therapy (Signed)
[place], [place], [address] Phone: [phone]   Fax:  [phone]  Physical Therapy Treatment  Patient Details  Name: Linda Mcgrath MRN: [id number] Date of Birth: 04-30-1933 Referring Provider (PT): [name]   Encounter Date: 02/08/2020   PT End of Session - 02/08/20 1608    Visit Number 27    Date for PT Re-Evaluation 02/24/20    PT Start Time 1522    PT Stop Time 1610    PT Time Calculation (min) 48 min    Activity Tolerance Patient tolerated treatment well;Patient limited by pain    Behavior During Therapy [facility] for tasks assessed/performed           Past Medical History:  Diagnosis Date  . Allergic rhinitis   . Asthma   . Cataract    Bil/lens implant  . Colon polyp 1999   small polyp  . Diverticulosis   . Dysrhythmia   . Fibromyalgia   . Hyperlipidemia   . Hypothyroidism   . Internal hemorrhoids   . Leg cramps   . Lung nodule    stable LUL 9 mm  . Menopausal syndrome   . Osteoarthritis   . UTI (urinary tract infection)     Past Surgical History:  Procedure Laterality Date  . Abd U/S  11/1998   negative  . Abd U/S  01/2001   gallbladder polyps  . ABDOMINAL HYSTERECTOMY  1975   total-fibroids  . APPENDECTOMY  1975  . BREAST BIOPSY     benign  . BREAST EXCISIONAL BIOPSY Left 1957  . CARDIOVERSION N/A 03/07/2016   Procedure: CARDIOVERSION;  Surgeon: Adrian Prows, MD;  Location: [facility];  Service: Cardiovascular;  Laterality: N/A;  . CATARACT EXTRACTION     bilateral  . CHOLECYSTECTOMY    . COLONOSCOPY  1998   Diverticulosis; polyp\  . COLONOSCOPY  09/2002   Diverticulosis, hem  . COLONOSCOPY  12/2007   diverticulosis, polyp  . DEXA  10/2001   osteopenia  . ELECTROPHYSIOLOGIC STUDY N/A 04/03/2016   Procedure: A-Flutter Ablation;  Surgeon: Will Meredith Leeds, MD;  Location: [facility];  Service: Cardiovascular;  Laterality: N/A;  .  ESOPHAGOGASTRODUODENOSCOPY  2004  . Hida scan  01/2001   Negative  . KNEE SURGERY     right knee / 11/2004  . LAMINECTOMY WITH POSTERIOR LATERAL ARTHRODESIS LEVEL 2 N/A 07/31/2017   Procedure: DECOMPRESSIVE LAMINECTOMY LUMABR FOUR-FIVE, LUMBAR FIVE-SACRAL ONE;  Surgeon: Eustace Moore, MD;  Location: [facility];  Service: Neurosurgery;  Laterality: N/A;  . laser surgery for glaucoma Left Sep 21, 2014  . ROTATOR CUFF REPAIR     x 2 /right shoulder  . SKIN CANCER EXCISION     pre-melanoma / on face  . TEE WITHOUT CARDIOVERSION N/A 03/07/2016   Procedure: TRANSESOPHAGEAL ECHOCARDIOGRAM (TEE);  Surgeon: Adrian Prows, MD;  Location: [place];  Service: Cardiovascular;  Laterality: N/A;  . TOE SURGERY     rt foot     There were no vitals filed for this visit.   Subjective Assessment - 02/08/20 1523    Subjective Patient has not been in to see [place] over the past 3 weeks, she had multiple lumbar injections on 01/26/20.  She reports that the MD wanted her to continue to see PT, she reports that she is feeling a little better, has tried some yardwork, has right knee pain today    Currently in Pain? Yes  List Patient Active  Problem List   Diagnosis Date Noted  . Insomnia 12/15/2019  . Chronic low back pain 10/18/2019  . Positive depression screening 10/18/2019  . Osteoarthritis of right knee 08/02/2019  . Diarrhea 07/03/2018  . Dyspepsia 07/03/2018  . S/P lumbar spinal fusion 07/31/2017  . Right low back pain 03/04/2017  . H/O atrial flutter 03/06/2016  . Obesity 11/06/2015  . Urge incontinence 01/24/2015  . Estrogen deficiency 10/25/2014  . Lichen planus 05/01/255  . Encounter for Medicare annual wellness exam 06/17/2013  . Screening mammogram, encounter for 06/15/2012  . Prediabetes 06/03/2011  . HYPERTENSION, BENIGN ESSENTIAL 10/23/2010  . CONSTIPATION 10/23/2010  . HEMATOCHEZIA 10/23/2010  . OSTEOARTHRITIS, HANDS, BILATERAL 01/24/2010  . Hypothyroidism 08/24/2008  . Vitamin D deficiency 06/06/2008  . Hyperlipidemia 06/06/2008  . MENOPAUSAL SYNDROME 03/29/2008  . Osteopenia 03/29/2008  . ALLERGIC RHINITIS 01/25/2008  . IBS 01/25/2008  . OSTEOARTHRITIS 01/25/2008  . FIBROMYALGIA 01/25/2008    [name], PT 02/08/2020, 5:20 PM  [place] [address] [place] [address] [place], [place], [address] Phone: [phone]   Fax:  [phone]  Name: LORIA LACINA MRN: [id number] Date of Birth: [birth year]  [place], [place], [address] Phone: [phone]   Fax:  [phone]  Physical Therapy Treatment  Patient Details  Name: Linda Mcgrath MRN: [id number] Date of Birth: 04-30-1933 Referring Provider (PT): [name]   Encounter Date: 02/08/2020   PT End of Session - 02/08/20 1608    Visit Number 27    Date for PT Re-Evaluation 02/24/20    PT Start Time 1522    PT Stop Time 1610    PT Time Calculation (min) 48 min    Activity Tolerance Patient tolerated treatment well;Patient limited by pain    Behavior During Therapy [facility] for tasks assessed/performed           Past Medical History:  Diagnosis Date  . Allergic rhinitis   . Asthma   . Cataract    Bil/lens implant  . Colon polyp 1999   small polyp  . Diverticulosis   . Dysrhythmia   . Fibromyalgia   . Hyperlipidemia   . Hypothyroidism   . Internal hemorrhoids   . Leg cramps   . Lung nodule    stable LUL 9 mm  . Menopausal syndrome   . Osteoarthritis   . UTI (urinary tract infection)     Past Surgical History:  Procedure Laterality Date  . Abd U/S  11/1998   negative  . Abd U/S  01/2001   gallbladder polyps  . ABDOMINAL HYSTERECTOMY  1975   total-fibroids  . APPENDECTOMY  1975  . BREAST BIOPSY     benign  . BREAST EXCISIONAL BIOPSY Left 1957  . CARDIOVERSION N/A 03/07/2016   Procedure: CARDIOVERSION;  Surgeon: Adrian Prows, MD;  Location: [facility];  Service: Cardiovascular;  Laterality: N/A;  . CATARACT EXTRACTION     bilateral  . CHOLECYSTECTOMY    . COLONOSCOPY  1998   Diverticulosis; polyp\  . COLONOSCOPY  09/2002   Diverticulosis, hem  . COLONOSCOPY  12/2007   diverticulosis, polyp  . DEXA  10/2001   osteopenia  . ELECTROPHYSIOLOGIC STUDY N/A 04/03/2016   Procedure: A-Flutter Ablation;  Surgeon: Will Meredith Leeds, MD;  Location: [facility] [facility];  Service: Cardiovascular;  Laterality: N/A;  .  ESOPHAGOGASTRODUODENOSCOPY  2004  . Hida scan  01/2001   Negative  . KNEE SURGERY     right knee / 11/2004  . LAMINECTOMY WITH POSTERIOR LATERAL ARTHRODESIS LEVEL 2 N/A 07/31/2017   Procedure: DECOMPRESSIVE LAMINECTOMY LUMABR FOUR-FIVE, LUMBAR FIVE-SACRAL ONE;  Surgeon: Eustace Moore, MD;  Location: [facility];  Service: Neurosurgery;  Laterality: N/A;  . laser surgery for glaucoma Left Sep 21, 2014  . ROTATOR CUFF REPAIR     x 2 /right shoulder  . SKIN CANCER EXCISION     pre-melanoma / on face  . TEE WITHOUT CARDIOVERSION N/A 03/07/2016   Procedure: TRANSESOPHAGEAL ECHOCARDIOGRAM (TEE);  Surgeon: Adrian Prows, MD;  Location: [place];  Service: Cardiovascular;  Laterality: N/A;  . TOE SURGERY     rt foot     There were no vitals filed for this visit.   Subjective Assessment - 02/08/20 1523    Subjective Patient has not been in to see [place] over the past 3 weeks, she had multiple lumbar injections on 01/26/20.  She reports that the MD wanted her to continue to see PT, she reports that she is feeling a little better, has tried some yardwork, has right knee pain today    Currently in Pain? Yes

## 2020-02-09 DIAGNOSIS — H401131 Primary open-angle glaucoma, bilateral, mild stage: Secondary | ICD-10-CM | POA: Diagnosis not present

## 2020-02-09 DIAGNOSIS — Z961 Presence of intraocular lens: Secondary | ICD-10-CM | POA: Diagnosis not present

## 2020-02-15 ENCOUNTER — Other Ambulatory Visit: Payer: Self-pay

## 2020-02-15 ENCOUNTER — Ambulatory Visit: Payer: Medicare Other | Admitting: Physical Therapy

## 2020-02-15 ENCOUNTER — Encounter: Payer: Self-pay | Admitting: Physical Therapy

## 2020-02-15 DIAGNOSIS — M542 Cervicalgia: Secondary | ICD-10-CM | POA: Diagnosis not present

## 2020-02-15 DIAGNOSIS — M545 Low back pain, unspecified: Secondary | ICD-10-CM

## 2020-02-15 DIAGNOSIS — M62830 Muscle spasm of back: Secondary | ICD-10-CM

## 2020-02-15 DIAGNOSIS — R262 Difficulty in walking, not elsewhere classified: Secondary | ICD-10-CM | POA: Diagnosis not present

## 2020-02-15 NOTE — Therapy (Signed)
[facility] [address] [place], [place], [address] Phone: [phone]   Fax:  [phone]  Physical Therapy Treatment  Patient Details  Name: Linda Mcgrath MRN: [id number] Date of Birth: April 30, 1933 Referring Provider (PT): [name]   Encounter Date: 02/15/2020   PT End of Session - 02/15/20 1336    Visit Number 28    Date for PT Re-Evaluation 02/24/20    PT Start Time 1257    PT Stop Time 1341    PT Time Calculation (min) 44 min    Activity Tolerance Patient tolerated treatment well    Behavior During Therapy [facility] for tasks assessed/performed           Past Medical History:  Diagnosis Date  . Allergic rhinitis   . Asthma   . Cataract    Bil/lens implant  . Colon polyp 1999   small polyp  . Diverticulosis   . Dysrhythmia   . Fibromyalgia   . Hyperlipidemia   . Hypothyroidism   . Internal hemorrhoids   . Leg cramps   . Lung nodule    stable LUL 9 mm  . Menopausal syndrome   . Osteoarthritis   . UTI (urinary tract infection)     Past Surgical History:  Procedure Laterality Date  . Abd U/S  11/1998   negative  . Abd U/S  01/2001   gallbladder polyps  . ABDOMINAL HYSTERECTOMY  1975   total-fibroids  . APPENDECTOMY  1975  . BREAST BIOPSY     benign  . BREAST EXCISIONAL BIOPSY Left 1957  . CARDIOVERSION N/A 03/07/2016   Procedure: CARDIOVERSION;  Surgeon: Yates Decamp, MD;  Location: [facility];  Service: Cardiovascular;  Laterality: N/A;  . CATARACT EXTRACTION     bilateral  . CHOLECYSTECTOMY    . COLONOSCOPY  1998   Diverticulosis; polyp\  . COLONOSCOPY  09/2002   Diverticulosis, hem  . COLONOSCOPY  12/2007   diverticulosis, polyp  . DEXA  10/2001   osteopenia  . ELECTROPHYSIOLOGIC STUDY N/A 04/03/2016   Procedure: A-Flutter Ablation;  Surgeon: Will [name], MD;  [facility];  Service: Cardiovascular;  Laterality: N/A;  . ESOPHAGOGASTRODUODENOSCOPY  2004  . Hida  scan  01/2001   Negative  . KNEE SURGERY     right knee / 11/2004  . LAMINECTOMY WITH POSTERIOR LATERAL ARTHRODESIS LEVEL 2 N/A 07/31/2017   Procedure: DECOMPRESSIVE LAMINECTOMY LUMABR FOUR-FIVE, LUMBAR FIVE-SACRAL ONE;  Surgeon: Tia Alert, MD;  Location: [facility];  Service: Neurosurgery;  Laterality: N/A;  . laser surgery for glaucoma Left Sep 21, 2014  . ROTATOR CUFF REPAIR     x 2 /right shoulder  . SKIN CANCER EXCISION     pre-melanoma / on face  . TEE WITHOUT CARDIOVERSION N/A 03/07/2016   Procedure: TRANSESOPHAGEAL ECHOCARDIOGRAM (TEE);  Surgeon: Yates Decamp, MD;  Location: [facility];  Service: Cardiovascular;  Laterality: N/A;  . TOE SURGERY     rt foot     There were no vitals filed for this visit.   Subjective Assessment - 02/15/20 1300    Subjective Patient reports that she is doing well, having some increased knee pain today, which is causing some back pain, reports that the injections have helped,    Currently in Pain? Yes    Pain Score 5     Pain Location Back    Pain Orientation Right;Lower    Pain Descriptors / Indicators Aching    Aggravating  Factors  weather?                             [facility] Adult PT Treatment/Exercise - 02/15/20 0001      Lumbar Exercises: Standing   Other Standing Lumbar Exercises hip abduction 3#, hip extension 3# 2x10 each    Other Standing Lumbar Exercises hip marching 3#      Lumbar Exercises: Seated   Long Arc Quad on Chair Both;10 reps;3 sets    LAQ on Chair Weights (lbs) 3#    Other Seated Lumbar Exercises pelvic clocks with cues for control    Other Seated Lumbar Exercises heel raises, toe raises, on sit fit pelvic mobility      Lumbar Exercises: Supine   Other Supine Lumbar Exercises feet on ball K2C, trunk rotation, small bridges, isometric abs      Lumbar Exercises: Sidelying   Clam Both;10 reps;1 second                  PT Education - 02/15/20 1335    Education Details gave HEP and performed  for LAQ, marches, toe taps, heel raises and pillow squeezes    Person(s) Educated Patient    Methods Explanation;Demonstration;Tactile cues;Verbal cues;Handout    Comprehension Verbalized understanding;Returned demonstration            PT Short Term Goals - 11/16/19 1132      PT SHORT TERM GOAL #1   Title independent with HEP    Status Achieved             PT Long Term Goals - 02/08/20 1719      PT LONG TERM GOAL #1   Title decrease pain 50%    Status Partially Met      PT LONG TERM GOAL #2   Title increase lumbar ROM 25%    Status Achieved                 Plan - 02/15/20 1337    Clinical Impression Statement Started the process to get her to stay active and doing exercises once we D/C after next week, she had some questions about how to stay safe, I do worry that she may over do it at times.  The knee also may cause some issues    PT Next Visit Plan really work on what she can do with [place] looking to d/c in the next few weeks, HEP vs gym program here    Consulted and Agree with Plan of Care Patient           Patient will benefit from skilled therapeutic intervention in order to improve the following deficits and impairments:  Abnormal gait, Pain, Improper body mechanics, Postural dysfunction, Increased muscle spasms, Decreased activity tolerance, Decreased endurance, Decreased range of motion, Decreased strength, Difficulty walking, Impaired flexibility  Visit Diagnosis: Acute bilateral low back pain without sciatica  Muscle spasm of back  Difficulty in walking, not elsewhere classified  Cervicalgia     Problem List Patient Active Problem List   Diagnosis Date Noted  . Insomnia 12/15/2019  . Chronic low back pain 10/18/2019  . Positive depression screening 10/18/2019  . Osteoarthritis of right knee 08/02/2019  . Diarrhea 07/03/2018  . Dyspepsia 07/03/2018  . S/P lumbar spinal fusion 07/31/2017  . Right low back pain 03/04/2017  . H/O atrial  flutter 03/06/2016  . Obesity 11/06/2015  . Urge incontinence 01/24/2015  . Estrogen deficiency 10/25/2014  .  Lichen planus 12/20/2013  . Encounter for Medicare annual wellness exam 06/17/2013  . Screening mammogram, encounter for 06/15/2012  . Prediabetes 06/03/2011  . HYPERTENSION, BENIGN ESSENTIAL 10/23/2010  . CONSTIPATION 10/23/2010  . HEMATOCHEZIA 10/23/2010  . OSTEOARTHRITIS, HANDS, BILATERAL 01/24/2010  . Hypothyroidism 08/24/2008  . Vitamin D deficiency 06/06/2008  . Hyperlipidemia 06/06/2008  . MENOPAUSAL SYNDROME 03/29/2008  . Osteopenia 03/29/2008  . ALLERGIC RHINITIS 01/25/2008  . IBS 01/25/2008  . OSTEOARTHRITIS 01/25/2008  . FIBROMYALGIA 01/25/2008    Jearld Lesch., PT 02/15/2020, 1:39 PM  [facility] [place] [address] [place], [place], [address] Phone: [phone]   Fax:  [phone]  Name: IONA BOSIER MRN: [id number] Date of Birth: [birth year]

## 2020-02-17 ENCOUNTER — Ambulatory Visit: Payer: Medicare Other | Admitting: Physical Therapy

## 2020-02-17 ENCOUNTER — Encounter: Payer: Self-pay | Admitting: Physical Therapy

## 2020-02-17 ENCOUNTER — Other Ambulatory Visit: Payer: Self-pay

## 2020-02-17 DIAGNOSIS — M542 Cervicalgia: Secondary | ICD-10-CM | POA: Diagnosis not present

## 2020-02-17 DIAGNOSIS — M62830 Muscle spasm of back: Secondary | ICD-10-CM | POA: Diagnosis not present

## 2020-02-17 DIAGNOSIS — M545 Low back pain, unspecified: Secondary | ICD-10-CM

## 2020-02-17 DIAGNOSIS — R262 Difficulty in walking, not elsewhere classified: Secondary | ICD-10-CM

## 2020-02-17 NOTE — Therapy (Signed)
[facility] [place], [place], [address] Phone: [phone]   Fax:  [phone]  Physical Therapy Treatment  Patient Details  Name: Linda Mcgrath MRN: [id number] Date of Birth: 1932/11/14 Referring Provider (PT): [name]   Encounter Date: 02/17/2020   PT End of Session - 02/17/20 1726    Visit Number 29    Date for PT Re-Evaluation 02/24/20    PT Start Time 1515    PT Stop Time 1605    PT Time Calculation (min) 50 min    Activity Tolerance Patient tolerated treatment well    Behavior During Therapy [facility] for tasks assessed/performed           Past Medical History:  Diagnosis Date  . Allergic rhinitis   . Asthma   . Cataract    Bil/lens implant  . Colon polyp 1999   small polyp  . Diverticulosis   . Dysrhythmia   . Fibromyalgia   . Hyperlipidemia   . Hypothyroidism   . Internal hemorrhoids   . Leg cramps   . Lung nodule    stable LUL 9 mm  . Menopausal syndrome   . Osteoarthritis   . UTI (urinary tract infection)     Past Surgical History:  Procedure Laterality Date  . Abd U/S  11/1998   negative  . Abd U/S  01/2001   gallbladder polyps  . ABDOMINAL HYSTERECTOMY  1975   total-fibroids  . APPENDECTOMY  1975  . BREAST BIOPSY     benign  . BREAST EXCISIONAL BIOPSY Left 1957  . CARDIOVERSION N/A 03/07/2016   Procedure: CARDIOVERSION;  Surgeon: Adrian Prows, MD;  Location: [place];  Service: Cardiovascular;  Laterality: N/A;  . CATARACT EXTRACTION     bilateral  . CHOLECYSTECTOMY    . COLONOSCOPY  1998   Diverticulosis; polyp\  . COLONOSCOPY  09/2002   Diverticulosis, hem  . COLONOSCOPY  12/2007   diverticulosis, polyp  . DEXA  10/2001   osteopenia  . ELECTROPHYSIOLOGIC STUDY N/A 04/03/2016   Procedure: A-Flutter Ablation;  Surgeon: Will Meredith Leeds, MD;  Location: [place] [facility];  Service: Cardiovascular;  Laterality: N/A;  . ESOPHAGOGASTRODUODENOSCOPY  2004  . Hida  scan  01/2001   Negative  . KNEE SURGERY     right knee / 11/2004  . LAMINECTOMY WITH POSTERIOR LATERAL ARTHRODESIS LEVEL 2 N/A 07/31/2017   Procedure: DECOMPRESSIVE LAMINECTOMY LUMABR FOUR-FIVE, LUMBAR FIVE-SACRAL ONE;  Surgeon: Eustace Moore, MD;  Location: [place];  Service: Neurosurgery;  Laterality: N/A;  . laser surgery for glaucoma Left Sep 21, 2014  . ROTATOR CUFF REPAIR     x 2 /right shoulder  . SKIN CANCER EXCISION     pre-melanoma / on face  . TEE WITHOUT CARDIOVERSION N/A 03/07/2016   Procedure: TRANSESOPHAGEAL ECHOCARDIOGRAM (TEE);  Surgeon: Adrian Prows, MD;  Location: [place];  Service: Cardiovascular;  Laterality: N/A;  . TOE SURGERY     rt foot     There were no vitals filed for this visit.   Subjective Assessment - 02/17/20 1600    Subjective Patient was able to go [place] the farmers market yesterday, reports that she did well and walked okay, reports that today her back is sore and stiff and she is very tired    Currently in Pain? Yes    Pain Score 5     Pain Location Back    Aggravating Factors  walking  [place], [place], [address] Phone: [phone]   Fax:  [phone]  Physical Therapy Treatment  Patient Details  Name: Linda Mcgrath MRN: [id number] Date of Birth: 1932/11/14 Referring Provider (PT): [name]   Encounter Date: 02/17/2020   PT End of Session - 02/17/20 1726    Visit Number 29    Date for PT Re-Evaluation 02/24/20    PT Start Time 1515    PT Stop Time 1605    PT Time Calculation (min) 50 min    Activity Tolerance Patient tolerated treatment well    Behavior During Therapy [facility] for tasks assessed/performed           Past Medical History:  Diagnosis Date  . Allergic rhinitis   . Asthma   . Cataract    Bil/lens implant  . Colon polyp 1999   small polyp  . Diverticulosis   . Dysrhythmia   . Fibromyalgia   . Hyperlipidemia   . Hypothyroidism   . Internal hemorrhoids   . Leg cramps   . Lung nodule    stable LUL 9 mm  . Menopausal syndrome   . Osteoarthritis   . UTI (urinary tract infection)     Past Surgical History:  Procedure Laterality Date  . Abd U/S  11/1998   negative  . Abd U/S  01/2001   gallbladder polyps  . ABDOMINAL HYSTERECTOMY  1975   total-fibroids  . APPENDECTOMY  1975  . BREAST BIOPSY     benign  . BREAST EXCISIONAL BIOPSY Left 1957  . CARDIOVERSION N/A 03/07/2016   Procedure: CARDIOVERSION;  Surgeon: Adrian Prows, MD;  Location: [place];  Service: Cardiovascular;  Laterality: N/A;  . CATARACT EXTRACTION     bilateral  . CHOLECYSTECTOMY    . COLONOSCOPY  1998   Diverticulosis; polyp\  . COLONOSCOPY  09/2002   Diverticulosis, hem  . COLONOSCOPY  12/2007   diverticulosis, polyp  . DEXA  10/2001   osteopenia  . ELECTROPHYSIOLOGIC STUDY N/A 04/03/2016   Procedure: A-Flutter Ablation;  Surgeon: Will Meredith Leeds, MD;  Location: [place] [facility];  Service: Cardiovascular;  Laterality: N/A;  . ESOPHAGOGASTRODUODENOSCOPY  2004  . Hida  scan  01/2001   Negative  . KNEE SURGERY     right knee / 11/2004  . LAMINECTOMY WITH POSTERIOR LATERAL ARTHRODESIS LEVEL 2 N/A 07/31/2017   Procedure: DECOMPRESSIVE LAMINECTOMY LUMABR FOUR-FIVE, LUMBAR FIVE-SACRAL ONE;  Surgeon: Eustace Moore, MD;  Location: [place];  Service: Neurosurgery;  Laterality: N/A;  . laser surgery for glaucoma Left Sep 21, 2014  . ROTATOR CUFF REPAIR     x 2 /right shoulder  . SKIN CANCER EXCISION     pre-melanoma / on face  . TEE WITHOUT CARDIOVERSION N/A 03/07/2016   Procedure: TRANSESOPHAGEAL ECHOCARDIOGRAM (TEE);  Surgeon: Adrian Prows, MD;  Location: [place];  Service: Cardiovascular;  Laterality: N/A;  . TOE SURGERY     rt foot     There were no vitals filed for this visit.   Subjective Assessment - 02/17/20 1600    Subjective Patient was able to go to the farmers market yesterday, reports that she did well and walked okay, reports that today her back is sore and stiff and she is very tired    Currently in Pain? Yes    Pain Score 5     Pain Location Back    Aggravating Factors  walking  Difficulty in walking, not elsewhere  classified  Cervicalgia     Problem List Patient Active Problem List   Diagnosis Date Noted  . Insomnia 12/15/2019  . Chronic low back pain 10/18/2019  . Positive depression screening 10/18/2019  . Osteoarthritis of right knee 08/02/2019  . Diarrhea 07/03/2018  . Dyspepsia 07/03/2018  . S/P lumbar spinal fusion 07/31/2017  . Right low back pain 03/04/2017  . H/O atrial flutter 03/06/2016  . Obesity 11/06/2015  . Urge incontinence 01/24/2015  . Estrogen deficiency 10/25/2014  . Lichen planus 88/75/7972  . Encounter for Medicare annual wellness exam 06/17/2013  . Screening mammogram, encounter for 06/15/2012  . Prediabetes 06/03/2011  . HYPERTENSION, BENIGN ESSENTIAL 10/23/2010  . CONSTIPATION 10/23/2010  . HEMATOCHEZIA 10/23/2010  . OSTEOARTHRITIS, HANDS, BILATERAL 01/24/2010  . Hypothyroidism 08/24/2008  . Vitamin D deficiency 06/06/2008  . Hyperlipidemia 06/06/2008  . MENOPAUSAL SYNDROME 03/29/2008  . Osteopenia 03/29/2008  . ALLERGIC RHINITIS 01/25/2008  . IBS 01/25/2008  . OSTEOARTHRITIS 01/25/2008  . FIBROMYALGIA 01/25/2008    [name], PT 02/17/2020, 5:31 PM  [address] [place], [place], [address] Phone: [phone]   Fax:  [phone]  Name: Linda Mcgrath MRN: [id number] Date of Birth: 1933/01/01

## 2020-02-22 ENCOUNTER — Encounter: Payer: Self-pay | Admitting: Physical Therapy

## 2020-02-22 ENCOUNTER — Ambulatory Visit: Payer: Medicare Other | Admitting: Physical Therapy

## 2020-02-22 ENCOUNTER — Other Ambulatory Visit: Payer: Self-pay

## 2020-02-22 DIAGNOSIS — R262 Difficulty in walking, not elsewhere classified: Secondary | ICD-10-CM | POA: Diagnosis not present

## 2020-02-22 DIAGNOSIS — M545 Low back pain, unspecified: Secondary | ICD-10-CM

## 2020-02-22 DIAGNOSIS — M62830 Muscle spasm of back: Secondary | ICD-10-CM

## 2020-02-22 DIAGNOSIS — M542 Cervicalgia: Secondary | ICD-10-CM | POA: Diagnosis not present

## 2020-02-22 NOTE — Therapy (Signed)
Impaired flexibility  Visit Diagnosis: Acute bilateral low back pain without sciatica  Muscle spasm of back  Difficulty in walking, not elsewhere classified  Cervicalgia     Problem List Patient Active Problem List   Diagnosis Date Noted  . Insomnia 12/15/2019  . Chronic low back pain 10/18/2019  . Positive depression screening 10/18/2019  . Osteoarthritis of right knee 08/02/2019  . Diarrhea 07/03/2018  . Dyspepsia 07/03/2018  . S/P lumbar spinal fusion 07/31/2017  . Right low back pain 03/04/2017  . H/O atrial flutter 03/06/2016  . Obesity 11/06/2015  . Urge incontinence 01/24/2015  . Estrogen deficiency 10/25/2014  . Lichen planus 36/46/8032  . Encounter for Medicare annual wellness exam 06/17/2013  . Screening mammogram, encounter for 06/15/2012  . Prediabetes 06/03/2011  . HYPERTENSION, BENIGN ESSENTIAL 10/23/2010  . CONSTIPATION 10/23/2010  . HEMATOCHEZIA 10/23/2010  . OSTEOARTHRITIS, HANDS, BILATERAL 01/24/2010  . Hypothyroidism 08/24/2008  . Vitamin D deficiency 06/06/2008  . Hyperlipidemia 06/06/2008  . MENOPAUSAL SYNDROME 03/29/2008  . Osteopenia 03/29/2008  . ALLERGIC RHINITIS 01/25/2008  . IBS 01/25/2008  . OSTEOARTHRITIS 01/25/2008  . FIBROMYALGIA 01/25/2008    Linda Mcgrath., PT 02/22/2020, 1:41 PM  [address] [place], [place], [address] Phone: [phone]   Fax:  [phone]  Name: Linda Mcgrath MRN: [id number] Date of Birth: 08-28-1932  Impaired flexibility  Visit Diagnosis: Acute bilateral low back pain without sciatica  Muscle spasm of back  Difficulty in walking, not elsewhere classified  Cervicalgia     Problem List Patient Active Problem List   Diagnosis Date Noted  . Insomnia 12/15/2019  . Chronic low back pain 10/18/2019  . Positive depression screening 10/18/2019  . Osteoarthritis of right knee 08/02/2019  . Diarrhea 07/03/2018  . Dyspepsia 07/03/2018  . S/P lumbar spinal fusion 07/31/2017  . Right low back pain 03/04/2017  . H/O atrial flutter 03/06/2016  . Obesity 11/06/2015  . Urge incontinence 01/24/2015  . Estrogen deficiency 10/25/2014  . Lichen planus 36/46/8032  . Encounter for Medicare annual wellness exam 06/17/2013  . Screening mammogram, encounter for 06/15/2012  . Prediabetes 06/03/2011  . HYPERTENSION, BENIGN ESSENTIAL 10/23/2010  . CONSTIPATION 10/23/2010  . HEMATOCHEZIA 10/23/2010  . OSTEOARTHRITIS, HANDS, BILATERAL 01/24/2010  . Hypothyroidism 08/24/2008  . Vitamin D deficiency 06/06/2008  . Hyperlipidemia 06/06/2008  . MENOPAUSAL SYNDROME 03/29/2008  . Osteopenia 03/29/2008  . ALLERGIC RHINITIS 01/25/2008  . IBS 01/25/2008  . OSTEOARTHRITIS 01/25/2008  . FIBROMYALGIA 01/25/2008    Linda Mcgrath., PT 02/22/2020, 1:41 PM  [address] [place], [place], [address] Phone: [phone]   Fax:  [phone]  Name: Linda Mcgrath MRN: [id number] Date of Birth: 08-28-1932  [address] [place], [place], [address] Phone: [phone]   Fax:  [phone] Progress Note Reporting Period 01/04/20 to 02/22/20 for visits 21-30  See note below for Objective Data and Assessment of Progress/Goals.      Physical Therapy Treatment  Patient Details  Name: Linda Mcgrath MRN: [id number] Date of Birth: 02-08-1933 Referring Provider (PT): [name]   Encounter Date: 02/22/2020   PT End of Session - 02/22/20 1338    Visit Number 30    Date for PT Re-Evaluation 02/24/20    PT Start Time 1259    PT Stop Time 1346    PT Time Calculation (min) 47 min    Activity Tolerance Patient tolerated treatment well    Behavior During Therapy [facility] for tasks assessed/performed           Past Medical History:  Diagnosis Date  . Allergic rhinitis   . Asthma   . Cataract    Bil/lens implant  . Colon polyp 1999   small polyp  . Diverticulosis   . Dysrhythmia   . Fibromyalgia   . Hyperlipidemia   . Hypothyroidism   . Internal hemorrhoids   . Leg cramps   . Lung nodule    stable LUL 9 mm  . Menopausal syndrome   . Osteoarthritis   . UTI (urinary tract infection)     Past Surgical History:  Procedure Laterality Date  . Abd U/S  11/1998   negative  . Abd U/S  01/2001   gallbladder polyps  . ABDOMINAL HYSTERECTOMY  1975   total-fibroids  . APPENDECTOMY  1975  . BREAST BIOPSY     benign  . BREAST EXCISIONAL BIOPSY Left 1957  . CARDIOVERSION N/A 03/07/2016   Procedure: CARDIOVERSION;  Surgeon: Adrian Prows, MD;  Location: [place];  Service: Cardiovascular;  Laterality: N/A;  . CATARACT EXTRACTION     bilateral  . CHOLECYSTECTOMY    . COLONOSCOPY  1998   Diverticulosis; polyp\  . COLONOSCOPY  09/2002   Diverticulosis, hem  . COLONOSCOPY  12/2007   diverticulosis, polyp  . DEXA  10/2001   osteopenia  . ELECTROPHYSIOLOGIC STUDY N/A 04/03/2016   Procedure: A-Flutter Ablation;  Surgeon:  Will Meredith Leeds, MD;  Location: [place] [facility];  Service: Cardiovascular;  Laterality: N/A;  . ESOPHAGOGASTRODUODENOSCOPY  2004  . Hida scan  01/2001   Negative  . KNEE SURGERY     right knee / 11/2004  . LAMINECTOMY WITH POSTERIOR LATERAL ARTHRODESIS LEVEL 2 N/A 07/31/2017   Procedure: DECOMPRESSIVE LAMINECTOMY LUMABR FOUR-FIVE, LUMBAR FIVE-SACRAL ONE;  Surgeon: Eustace Moore, MD;  Location: [place];  Service: Neurosurgery;  Laterality: N/A;  . laser surgery for glaucoma Left Sep 21, 2014  . ROTATOR CUFF REPAIR     x 2 /right shoulder  . SKIN CANCER EXCISION     pre-melanoma / on face  . TEE WITHOUT CARDIOVERSION N/A 03/07/2016   Procedure: TRANSESOPHAGEAL ECHOCARDIOGRAM (TEE);  Surgeon: Adrian Prows, MD;  Location: [place];  Service: Cardiovascular;  Laterality: N/A;  . TOE SURGERY     rt foot     There were no vitals filed for this visit.   Subjective Assessment - 02/22/20 1311    Subjective Patient reports that she is doing better, less pain overall.    Currently in Pain? Yes    Pain Score 4     Pain Location Back    Pain Descriptors / Indicators Aching

## 2020-02-24 ENCOUNTER — Other Ambulatory Visit: Payer: Self-pay

## 2020-02-24 ENCOUNTER — Ambulatory Visit: Payer: Medicare Other | Attending: Orthopaedic Surgery | Admitting: Physical Therapy

## 2020-02-24 ENCOUNTER — Encounter: Payer: Self-pay | Admitting: Physical Therapy

## 2020-02-24 DIAGNOSIS — M542 Cervicalgia: Secondary | ICD-10-CM | POA: Diagnosis not present

## 2020-02-24 DIAGNOSIS — M62830 Muscle spasm of back: Secondary | ICD-10-CM | POA: Diagnosis not present

## 2020-02-24 DIAGNOSIS — R262 Difficulty in walking, not elsewhere classified: Secondary | ICD-10-CM | POA: Insufficient documentation

## 2020-02-24 DIAGNOSIS — M545 Low back pain, unspecified: Secondary | ICD-10-CM

## 2020-02-24 NOTE — Therapy (Signed)
[place], [place], [address] Phone: [phone]   Fax:  [phone]  Physical Therapy Treatment  Patient Details  Name: Linda Mcgrath MRN: [id number] Date of Birth: Aug 07, 1933 Referring Provider (PT): [name]   Encounter Date: 02/24/2020   PT End of Session - 02/24/20 1430    Visit Number 31    PT Start Time 2751    PT Stop Time 1440    PT Time Calculation (min) 45 min    Activity Tolerance Patient tolerated treatment well    Behavior During Therapy [facility] for tasks assessed/performed           Past Medical History:  Diagnosis Date  . Allergic rhinitis   . Asthma   . Cataract    Bil/lens implant  . Colon polyp 1999   small polyp  . Diverticulosis   . Dysrhythmia   . Fibromyalgia   . Hyperlipidemia   . Hypothyroidism   . Internal hemorrhoids   . Leg cramps   . Lung nodule    stable LUL 9 mm  . Menopausal syndrome   . Osteoarthritis   . UTI (urinary tract infection)     Past Surgical History:  Procedure Laterality Date  . Abd U/S  11/1998   negative  . Abd U/S  01/2001   gallbladder polyps  . ABDOMINAL HYSTERECTOMY  1975   total-fibroids  . APPENDECTOMY  1975  . BREAST BIOPSY     benign  . BREAST EXCISIONAL BIOPSY Left 1957  . CARDIOVERSION N/A 03/07/2016   Procedure: CARDIOVERSION;  Surgeon: Adrian Prows, MD;  Location: [place];  Service: Cardiovascular;  Laterality: N/A;  . CATARACT EXTRACTION     bilateral  . CHOLECYSTECTOMY    . COLONOSCOPY  1998   Diverticulosis; polyp\  . COLONOSCOPY  09/2002   Diverticulosis, hem  . COLONOSCOPY  12/2007   diverticulosis, polyp  . DEXA  10/2001   osteopenia  . ELECTROPHYSIOLOGIC STUDY N/A 04/03/2016   Procedure: A-Flutter Ablation;  Surgeon: Will Meredith Leeds, MD;  Location: [place] [facility];  Service: Cardiovascular;  Laterality: N/A;  . ESOPHAGOGASTRODUODENOSCOPY  2004  . Hida scan  01/2001   Negative  . KNEE  SURGERY     right knee / 11/2004  . LAMINECTOMY WITH POSTERIOR LATERAL ARTHRODESIS LEVEL 2 N/A 07/31/2017   Procedure: DECOMPRESSIVE LAMINECTOMY LUMABR FOUR-FIVE, LUMBAR FIVE-SACRAL ONE;  Surgeon: Eustace Moore, MD;  Location: [place];  Service: Neurosurgery;  Laterality: N/A;  . laser surgery for glaucoma Left Sep 21, 2014  . ROTATOR CUFF REPAIR     x 2 /right shoulder  . SKIN CANCER EXCISION     pre-melanoma / on face  . TEE WITHOUT CARDIOVERSION N/A 03/07/2016   Procedure: TRANSESOPHAGEAL ECHOCARDIOGRAM (TEE);  Surgeon: Adrian Prows, MD;  Location: [place];  Service: Cardiovascular;  Laterality: N/A;  . TOE SURGERY     rt foot     There were no vitals filed for this visit.   Subjective Assessment - 02/24/20 1406    Subjective Patient reports that she is doing well, trying to do more at home when the weather is nice    Currently in Pain? Yes    Pain Score 3     Pain Location Back    Pain Orientation Right;Lower  [place], [place], [address] Phone: [phone]   Fax:  [phone]  Physical Therapy Treatment  Patient Details  Name: Linda Mcgrath MRN: [id number] Date of Birth: Aug 07, 1933 Referring Provider (PT): [name]   Encounter Date: 02/24/2020   PT End of Session - 02/24/20 1430    Visit Number 31    PT Start Time 2751    PT Stop Time 1440    PT Time Calculation (min) 45 min    Activity Tolerance Patient tolerated treatment well    Behavior During Therapy [facility] for tasks assessed/performed           Past Medical History:  Diagnosis Date  . Allergic rhinitis   . Asthma   . Cataract    Bil/lens implant  . Colon polyp 1999   small polyp  . Diverticulosis   . Dysrhythmia   . Fibromyalgia   . Hyperlipidemia   . Hypothyroidism   . Internal hemorrhoids   . Leg cramps   . Lung nodule    stable LUL 9 mm  . Menopausal syndrome   . Osteoarthritis   . UTI (urinary tract infection)     Past Surgical History:  Procedure Laterality Date  . Abd U/S  11/1998   negative  . Abd U/S  01/2001   gallbladder polyps  . ABDOMINAL HYSTERECTOMY  1975   total-fibroids  . APPENDECTOMY  1975  . BREAST BIOPSY     benign  . BREAST EXCISIONAL BIOPSY Left 1957  . CARDIOVERSION N/A 03/07/2016   Procedure: CARDIOVERSION;  Surgeon: Adrian Prows, MD;  Location: [place];  Service: Cardiovascular;  Laterality: N/A;  . CATARACT EXTRACTION     bilateral  . CHOLECYSTECTOMY    . COLONOSCOPY  1998   Diverticulosis; polyp\  . COLONOSCOPY  09/2002   Diverticulosis, hem  . COLONOSCOPY  12/2007   diverticulosis, polyp  . DEXA  10/2001   osteopenia  . ELECTROPHYSIOLOGIC STUDY N/A 04/03/2016   Procedure: A-Flutter Ablation;  Surgeon: Will Meredith Leeds, MD;  Location: [place] [facility];  Service: Cardiovascular;  Laterality: N/A;  . ESOPHAGOGASTRODUODENOSCOPY  2004  . Hida scan  01/2001   Negative  . KNEE  SURGERY     right knee / 11/2004  . LAMINECTOMY WITH POSTERIOR LATERAL ARTHRODESIS LEVEL 2 N/A 07/31/2017   Procedure: DECOMPRESSIVE LAMINECTOMY LUMABR FOUR-FIVE, LUMBAR FIVE-SACRAL ONE;  Surgeon: Eustace Moore, MD;  Location: [place];  Service: Neurosurgery;  Laterality: N/A;  . laser surgery for glaucoma Left Sep 21, 2014  . ROTATOR CUFF REPAIR     x 2 /right shoulder  . SKIN CANCER EXCISION     pre-melanoma / on face  . TEE WITHOUT CARDIOVERSION N/A 03/07/2016   Procedure: TRANSESOPHAGEAL ECHOCARDIOGRAM (TEE);  Surgeon: Adrian Prows, MD;  Location: [place];  Service: Cardiovascular;  Laterality: N/A;  . TOE SURGERY     rt foot     There were no vitals filed for this visit.   Subjective Assessment - 02/24/20 1406    Subjective Patient reports that she is doing well, trying to do more at home when the weather is nice    Currently in Pain? Yes    Pain Score 3     Pain Location Back    Pain Orientation Right;Lower  Prediabetes 06/03/2011  . HYPERTENSION, BENIGN ESSENTIAL 10/23/2010  . CONSTIPATION 10/23/2010  . HEMATOCHEZIA 10/23/2010  . OSTEOARTHRITIS, HANDS, BILATERAL 01/24/2010  . Hypothyroidism 08/24/2008  . Vitamin D deficiency 06/06/2008  . Hyperlipidemia 06/06/2008  . MENOPAUSAL SYNDROME 03/29/2008  . Osteopenia 03/29/2008  . ALLERGIC RHINITIS 01/25/2008  . IBS 01/25/2008  . OSTEOARTHRITIS 01/25/2008  . FIBROMYALGIA 01/25/2008    [name], PT 02/24/2020, 2:48 PM  [address] [place], [place], [address] Phone: [phone]   Fax:  [phone]  Name: ZAIYAH SOTTILE MRN: [id number] Date of Birth: 06/03/1933

## 2020-02-29 ENCOUNTER — Other Ambulatory Visit: Payer: Self-pay

## 2020-02-29 ENCOUNTER — Telehealth (INDEPENDENT_AMBULATORY_CARE_PROVIDER_SITE_OTHER): Payer: Medicare Other | Admitting: Family Medicine

## 2020-02-29 ENCOUNTER — Other Ambulatory Visit (INDEPENDENT_AMBULATORY_CARE_PROVIDER_SITE_OTHER): Payer: Medicare Other

## 2020-02-29 ENCOUNTER — Encounter: Payer: Self-pay | Admitting: Family Medicine

## 2020-02-29 DIAGNOSIS — K580 Irritable bowel syndrome with diarrhea: Secondary | ICD-10-CM

## 2020-02-29 DIAGNOSIS — R197 Diarrhea, unspecified: Secondary | ICD-10-CM

## 2020-02-29 LAB — CBC WITH DIFFERENTIAL/PLATELET
Basophils Absolute: 0.1 10*3/uL (ref 0.0–0.1)
Basophils Relative: 0.8 % (ref 0.0–3.0)
Eosinophils Absolute: 0.2 10*3/uL (ref 0.0–0.7)
Eosinophils Relative: 2.3 % (ref 0.0–5.0)
HCT: 42.8 % (ref 36.0–46.0)
Hemoglobin: 14.5 g/dL (ref 12.0–15.0)
Lymphocytes Relative: 45.3 % (ref 12.0–46.0)
Lymphs Abs: 3.4 10*3/uL (ref 0.7–4.0)
MCHC: 33.9 g/dL (ref 30.0–36.0)
MCV: 97.4 fl (ref 78.0–100.0)
Monocytes Absolute: 0.5 10*3/uL (ref 0.1–1.0)
Monocytes Relative: 7.2 % (ref 3.0–12.0)
Neutro Abs: 3.3 10*3/uL (ref 1.4–7.7)
Neutrophils Relative %: 44.4 % (ref 43.0–77.0)
Platelets: 262 10*3/uL (ref 150.0–400.0)
RBC: 4.39 Mil/uL (ref 3.87–5.11)
RDW: 13.1 % (ref 11.5–15.5)
WBC: 7.5 10*3/uL (ref 4.0–10.5)

## 2020-02-29 LAB — COMPREHENSIVE METABOLIC PANEL
ALT: 16 U/L (ref 0–35)
AST: 20 U/L (ref 0–37)
Albumin: 3.9 g/dL (ref 3.5–5.2)
Alkaline Phosphatase: 51 U/L (ref 39–117)
BUN: 23 mg/dL (ref 6–23)
CO2: 31 mEq/L (ref 19–32)
Calcium: 9.7 mg/dL (ref 8.4–10.5)
Chloride: 102 mEq/L (ref 96–112)
Creatinine, Ser: 1.2 mg/dL (ref 0.40–1.20)
GFR: 42.71 mL/min — ABNORMAL LOW (ref >60.00)
Glucose, Bld: 86 mg/dL (ref 70–99)
Potassium: 4.3 mEq/L (ref 3.5–5.1)
Sodium: 140 mEq/L (ref 135–145)
Total Bilirubin: 0.5 mg/dL (ref 0.2–1.2)
Total Protein: 6.6 g/dL (ref 6.0–8.3)

## 2020-02-29 MED ORDER — METRONIDAZOLE 500 MG PO TABS: 500.0000 mg | ORAL_TABLET | Freq: Three times a day (TID) | ORAL | 0 refills | Status: DC

## 2020-02-29 NOTE — Progress Notes (Signed)
Virtual Visit via Telephone Note  I connected with Linda Mcgrath on 02/29/20 at 12:00 PM EDT by telephone and verified that I am speaking with the correct person using two identifiers.  Location: Patient: home Provider: office   I discussed the limitations, risks, security and privacy concerns of performing an evaluation and management service by telephone and the availability of in person appointments. I also discussed with the patient that there may be a patient responsible charge related to this service. The patient expressed understanding and agreed to proceed.  Parties involved in encounter  Patient: Linda Mcgrath   Provider:  Loura Pardon MD     History of Present Illness: Pt presents for c/o diarrhea   Started early in June   3-4 days of diarrhea /took otc med and it calmed down  Then late June - bad abd cramps and diarrhea  Watery and loose - easy to loose continence  Has gone from occ to every day   No particular changes in diet  ? What she ate before this started Cooks at home   No n/v at all  Last week she felt a little chest fullness- and coughed up some phlegm  Better now    Has not seen blood in her stool  Dark from the pepto   No covid exposures  Stays in much of the time and wears mask when she is out  Had injection in back -worked well and had PT   No fever at all /no aches/chills No change in taste or smell   Low on energy  Has been drinking lots of water  Takes metamucil 3 in one caps- for a while   2 bm this am - stomach feels bloated  Otc: has taken liquid /pill (liquid helps more)    pepto bismol -helps some  Multi symptom relief capsule as well - simethicone   She has a known h/o diverticulitis  IBS as well   Stressors- worries about family /occ anxious   She was vaccinated for covid in feb   Last colonoscopy 2014- tics  Last week had a few leg cramps     Patient Active Problem List   Diagnosis Date Noted  .  Insomnia 12/15/2019  . Chronic low back pain 10/18/2019  . Positive depression screening 10/18/2019  . Osteoarthritis of right knee 08/02/2019  . Diarrhea 07/03/2018  . Dyspepsia 07/03/2018  . S/P lumbar spinal fusion 07/31/2017  . Right low back pain 03/04/2017  . H/O atrial flutter 03/06/2016  . Obesity 11/06/2015  . Urge incontinence 01/24/2015  . Estrogen deficiency 10/25/2014  . Lichen planus 78/[id number]/8502  . Encounter for Medicare annual wellness exam 06/17/2013  . Screening mammogram, encounter for 06/15/2012  . Prediabetes 06/03/2011  . HYPERTENSION, BENIGN ESSENTIAL 10/23/2010  . CONSTIPATION 10/23/2010  . HEMATOCHEZIA 10/23/2010  . OSTEOARTHRITIS, HANDS, BILATERAL 01/24/2010  . Hypothyroidism 08/24/2008  . Vitamin D deficiency 06/06/2008  . Hyperlipidemia 06/06/2008  . MENOPAUSAL SYNDROME 03/29/2008  . Osteopenia 03/29/2008  . ALLERGIC RHINITIS 01/25/2008  . IBS 01/25/2008  . OSTEOARTHRITIS 01/25/2008  . FIBROMYALGIA 01/25/2008   Past Medical History:  Diagnosis Date  . Allergic rhinitis   . Asthma   . Cataract    Bil/lens implant  . Colon polyp 1999   small polyp  . Diverticulosis   . Dysrhythmia   . Fibromyalgia   . Hyperlipidemia   . Hypothyroidism   . Internal hemorrhoids   . Leg cramps   . Lung nodule  stable LUL 9 mm  . Menopausal syndrome   . Osteoarthritis   . UTI (urinary tract infection)    Past Surgical History:  Procedure Laterality Date  . Abd U/S  11/1998   negative  . Abd U/S  01/2001   gallbladder polyps  . ABDOMINAL HYSTERECTOMY  1975   total-fibroids  . APPENDECTOMY  1975  . BREAST BIOPSY     benign  . BREAST EXCISIONAL BIOPSY Left 1957  . CARDIOVERSION N/A 03/07/2016   Procedure: CARDIOVERSION;  Surgeon: Adrian Prows, MD;  Location: [facility];  Service: Cardiovascular;  Laterality: N/A;  . CATARACT EXTRACTION     bilateral  . CHOLECYSTECTOMY    . COLONOSCOPY  1998   Diverticulosis; polyp\  . COLONOSCOPY  09/2002    Diverticulosis, hem  . COLONOSCOPY  12/2007   diverticulosis, polyp  . DEXA  10/2001   osteopenia  . ELECTROPHYSIOLOGIC STUDY N/A 04/03/2016   Procedure: A-Flutter Ablation;  Surgeon: Will Meredith Leeds, MD;  Location: [facility];  Service: Cardiovascular;  Laterality: N/A;  . ESOPHAGOGASTRODUODENOSCOPY  2004  . [place] scan  01/2001   Negative  . KNEE SURGERY     right knee / 11/2004  . LAMINECTOMY WITH POSTERIOR LATERAL ARTHRODESIS LEVEL 2 N/A 07/31/2017   Procedure: DECOMPRESSIVE LAMINECTOMY LUMABR FOUR-FIVE, LUMBAR FIVE-SACRAL ONE;  Surgeon: Eustace Moore, MD;  Location: [facility];  Service: Neurosurgery;  Laterality: N/A;  . laser surgery for glaucoma Left Sep 21, 2014  . ROTATOR CUFF REPAIR     x 2 /right shoulder  . SKIN CANCER EXCISION     pre-melanoma / on face  . TEE WITHOUT CARDIOVERSION N/A 03/07/2016   Procedure: TRANSESOPHAGEAL ECHOCARDIOGRAM (TEE);  Surgeon: Adrian Prows, MD;  Location: [facility];  Service: Cardiovascular;  Laterality: N/A;  . TOE SURGERY     rt foot    Social History   Tobacco Use  . Smoking status: Never Smoker  . Smokeless tobacco: Never Used  Vaping Use  . Vaping Use: Never assessed  Substance Use Topics  . Alcohol use: No    Alcohol/week: 0.0 standard drinks    Comment: occasional-rare  . Drug use: No   Family History  Problem Relation Age of Onset  . Parkinsonism Father   . Colon cancer Brother   . Allergies Mother   . Heart disease Mother   . Kidney failure Son        congenital  (kidney transplant)   . Heart failure Son        died suddenly of CHF    Allergies  Allergen Reactions  . Shellfish Allergy Anaphylaxis and Nausea And Vomiting  . Tetracycline Hives, Nausea And Vomiting, Rash and Other (See Comments)    SYSTEMIC REACTION: heart palpitations,muscle spasms, vomiting  . Amlodipine Besylate Other (See Comments)    REACTION: muscle spasms, extremity swelling, low bp  . Ciprofloxacin Other (See Comments)    REACTION: muscle  spasms, insomnia  . Codeine Other (See Comments)    REACTION: hallucinations  . Fosamax [Alendronate Sodium]   . Propoxyphene Hcl Other (See Comments)    Unknown  . Sulfamethoxazole-Trimethoprim Nausea Only and Other (See Comments)    REACTION: dizzy, could not focus eyes and could not concentrate and nausea  . Tape Other (See Comments)    BANDAIDS TAKE OFF HER SKIN; PLEASE USE COBAN OR PAPER   . Xarelto [Rivaroxaban] Other (See Comments)    Aches and pains and nervousness  . Amoxicillin Rash  .  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 Penicillins Swelling, Rash and Other (See Comments)    Has patient had a PCN reaction causing immediate rash, facial/tongue/throat swelling, SOB or lightheadedness with hypotension: Yes Has patient had a PCN reaction causing severe rash involving mucus membranes or skin necrosis: No Has patient had a PCN reaction that required hospitalization No Has patient had a PCN reaction occurring within the last 10 years: No If all of the above answers are "NO", then may proceed with Cephalosporin use.    Current Outpatient Medications on File Prior to Visit  Medication Sig Dispense Refill  . Ascorbic Acid (VITAMIN C) 1000 MG tablet Take 1,000 mg by mouth daily.      . B Complex Vitamins (VITAMIN B COMPLEX PO) Take 1 tablet by mouth daily.    . Biotin 10 MG TABS Take 1 tablet by mouth every morning.     . Calcium Citrate-Vitamin D (CALCIUM CITRATE + D) 250-200 MG-UNIT TABS Take 1 tablet by mouth daily.    . cholecalciferol (VITAMIN D) 1000 units tablet Take 1,000 Units by mouth daily.    Marland Kitchen estradiol (ESTRACE) 1 MG tablet Take 1 tablet (1 mg total) by mouth daily. 90 tablet 3  . famotidine (PEPCID) 20 MG tablet Take 1 tablet (20 mg total) by mouth 2 (two) times daily. 60 tablet 3  . Fexofenadine  HCl (MUCINEX ALLERGY PO) Take 1 tablet by mouth daily as needed (Allergies).     . folic acid (FOLVITE) 1 MG tablet Take 1 mg by mouth daily.    Marland Kitchen glucosamine-chondroitin 500-400 MG tablet Take 2 tablets by mouth daily.    Marland Kitchen levocetirizine (XYZAL ALLERGY 24HR) 5 MG tablet Take 5 mg by mouth every evening.    Marland Kitchen levothyroxine (SYNTHROID) 88 MCG tablet Take 1 tablet (88 mcg total) by mouth daily with breakfast. 90 tablet 3  . loratadine (CLARITIN) 10 MG tablet Take 10 mg by mouth daily as needed for allergies.     Marland Kitchen MAGNESIUM-OXIDE PO Take 1 tablet by mouth daily.    . Methenamine-Sodium Salicylate (AZO URINARY TRACT DEFENSE PO) Take 1 capsule by mouth daily.    . methocarbamol (ROBAXIN) 500 MG tablet TAKE 1 TABLET (500 MG TOTAL) BY MOUTH EVERY 8 (EIGHT) HOURS AS NEEDED FOR MUSCLE SPASMS. CAUTION OF SEDATION 30 tablet 1  . Multiple Vitamin (MULTIVITAMIN) capsule Take 1 capsule by mouth daily.      . TURMERIC PO Take 1 capsule by mouth 2 (two) times daily.     . vitamin B-12 (CYANOCOBALAMIN) 500 MCG tablet Take 500 mcg by mouth daily.    Marland Kitchen VITAMIN D PO Take 4,000 Units by mouth daily.     No current facility-administered medications on file prior to visit.   Review of Systems  Constitutional: Positive for malaise/fatigue. Negative for chills and fever.  HENT: Negative for congestion, ear pain, sinus pain and sore throat.   Eyes: Negative for blurred vision, discharge and redness.  Respiratory: Negative for cough, shortness of breath and stridor.        Had a cough last week (not unusual for her) Better now   Cardiovascular: Negative for chest pain, palpitations and leg swelling.  Gastrointestinal: Positive for diarrhea. Negative for abdominal pain, blood in stool, constipation, heartburn, melena, nausea and vomiting.  Some cramping with diarrhea   Musculoskeletal: Negative for myalgias.  Skin: Negative for rash.  Neurological: Negative for dizziness, weakness and headaches.   Psychiatric/Behavioral:       Some worry but not severely anxious     Observations/Objective: Pt sounds well -like her usual self Nl mood- not distressed  Not hoarse  Does not clear throat or cough  Good historian-nl cognition and memory    Assessment and Plan: Problem List Items Addressed This Visit      Digestive   IBS    Recent persistent diarrhea - a bit different from her usual IBS  Taking fiber        Other   Diarrhea - Primary    Recurrent and now more or less constant  Some abd cramping and fatigue but no other symptoms  No covid contacts and has been immunized   She has h/o diverticulitis (with mult drug intol) and also IBS Ordered labs to come in for-cbc and cmet  Also stool c diff test and cx  metronizadole sent to pharmacy to start once she has given stool sample  inst to update if fever or any other new symptoms  Update if not starting to improve in a week or if worsening        Relevant Orders   CBC with Differential/Platelet   Comprehensive metabolic panel   C. difficile GDH and Toxin A/B   Stool culture       Follow Up Instructions: Keep up a good fluid intake  If dizzy or fever or any new symptoms please let us know   The office will call and make a lab appt for you to get stool tests going and check a cbc and cmet   Once you have given sample for stool studies-start the generic flagyl I sent to your pharmacy   Update if not starting to improve in a week or if worsening   I discussed the assessment and treatment plan with the patient. The patient was provided an opportunity to ask questions and all were answered. The patient agreed with the plan and demonstrated an understanding of the instructions.   The patient was advised to call back or seek an in-person evaluation if the symptoms worsen or if the condition fails to improve as anticipated.  I provided 18  minutes of non-face-to-face time during this encounter.   Loura Pardon, MD

## 2020-02-29 NOTE — Assessment & Plan Note (Signed)
Recurrent and now more or less constant  Some abd cramping and fatigue but no other symptoms  No covid contacts and has been immunized   She has h/o diverticulitis (with mult drug intol) and also IBS Ordered labs to come in for-cbc and cmet  Also stool c diff test and cx  metronizadole sent to pharmacy to start once she has given stool sample  inst to update if fever or any other new symptoms  Update if not starting to improve in a week or if worsening

## 2020-02-29 NOTE — Patient Instructions (Signed)
Keep up a good fluid intake  If dizzy or fever or any new symptoms please let us know   The office will call and make a lab appt for you to get stool tests going and check a cbc and cmet   Once you have given sample for stool studies-start the generic flagyl I sent to your pharmacy   Update if not starting to improve in a week or if worsening

## 2020-02-29 NOTE — Assessment & Plan Note (Signed)
Recent persistent diarrhea - a bit different from her usual IBS  Taking fiber

## 2020-03-01 DIAGNOSIS — R197 Diarrhea, unspecified: Secondary | ICD-10-CM | POA: Diagnosis not present

## 2020-03-01 NOTE — Addendum Note (Signed)
Addended by: Ellamae Sia on: 03/01/2020 03:14 PM   Modules accepted: Orders

## 2020-03-02 DIAGNOSIS — R197 Diarrhea, unspecified: Secondary | ICD-10-CM | POA: Diagnosis not present

## 2020-03-02 NOTE — Addendum Note (Signed)
Addended by: Ellamae Sia on: 03/02/2020 02:57 PM   Modules accepted: Orders

## 2020-03-03 LAB — C. DIFFICILE GDH AND TOXIN A/B
GDH ANTIGEN: NOT DETECTED
MICRO NUMBER:: 10681015
SPECIMEN QUALITY:: ADEQUATE
TOXIN A AND B: NOT DETECTED

## 2020-03-05 LAB — STOOL CULTURE
MICRO NUMBER:: 10675718
MICRO NUMBER:: 10675719
MICRO NUMBER:: 10675720
SHIGA RESULT:: NOT DETECTED
SPECIMEN QUALITY:: ADEQUATE
SPECIMEN QUALITY:: ADEQUATE
SPECIMEN QUALITY:: ADEQUATE

## 2020-03-06 ENCOUNTER — Telehealth: Payer: Self-pay | Admitting: *Deleted

## 2020-03-06 NOTE — Telephone Encounter (Signed)
Regarding stool test

## 2020-03-07 ENCOUNTER — Telehealth: Payer: Self-pay | Admitting: Family Medicine

## 2020-03-07 DIAGNOSIS — R1031 Right lower quadrant pain: Secondary | ICD-10-CM

## 2020-03-07 DIAGNOSIS — R109 Unspecified abdominal pain: Secondary | ICD-10-CM | POA: Insufficient documentation

## 2020-03-07 DIAGNOSIS — R197 Diarrhea, unspecified: Secondary | ICD-10-CM

## 2020-03-07 NOTE — Telephone Encounter (Signed)
Referral done for CT scan Will route to [facility]

## 2020-03-07 NOTE — Telephone Encounter (Signed)
Addressed in result notes  

## 2020-03-07 NOTE — Telephone Encounter (Signed)
-----   Message from Tammi Sou, [place] sent at 03/07/2020  2:55 PM EDT ----- Pt notified of Dr. Marliss Coots recommendations and instructions and verbalized understanding. Pt would like CT scan she lives in [place] so that's where she would like it done if possible. Pt said she has no known allergies to contrast

## 2020-03-09 ENCOUNTER — Encounter (HOSPITAL_COMMUNITY): Payer: Self-pay

## 2020-03-09 ENCOUNTER — Other Ambulatory Visit: Payer: Self-pay

## 2020-03-09 ENCOUNTER — Ambulatory Visit (HOSPITAL_COMMUNITY)
Admission: RE | Admit: 2020-03-09 | Discharge: 2020-03-09 | Disposition: A | Discharge status: Home / Self Care | Payer: Medicare Other | Source: Ambulatory Visit | Attending: Family Medicine | Admitting: Family Medicine

## 2020-03-09 DIAGNOSIS — R1031 Right lower quadrant pain: Secondary | ICD-10-CM | POA: Diagnosis not present

## 2020-03-09 DIAGNOSIS — K7689 Other specified diseases of liver: Secondary | ICD-10-CM | POA: Diagnosis not present

## 2020-03-09 DIAGNOSIS — K3189 Other diseases of stomach and duodenum: Secondary | ICD-10-CM | POA: Diagnosis not present

## 2020-03-09 DIAGNOSIS — K6389 Other specified diseases of intestine: Secondary | ICD-10-CM | POA: Diagnosis not present

## 2020-03-09 DIAGNOSIS — I700 Atherosclerosis of aorta: Secondary | ICD-10-CM | POA: Diagnosis not present

## 2020-03-09 MED ORDER — SODIUM CHLORIDE (PF) 0.9 % IJ SOLN
INTRAMUSCULAR | Status: AC
  Filled 2020-03-09: qty 50

## 2020-03-09 MED ORDER — IOHEXOL 300 MG/ML  SOLN
100.0000 mL | Freq: Once | INTRAMUSCULAR | Status: AC | PRN
  Administered 2020-03-09: 75 mL via INTRAVENOUS

## 2020-03-13 ENCOUNTER — Telehealth: Payer: Self-pay | Admitting: Family Medicine

## 2020-03-13 DIAGNOSIS — K580 Irritable bowel syndrome with diarrhea: Secondary | ICD-10-CM

## 2020-03-13 DIAGNOSIS — K2980 Duodenitis without bleeding: Secondary | ICD-10-CM | POA: Insufficient documentation

## 2020-03-13 NOTE — Telephone Encounter (Signed)
Will send referral to [facility]

## 2020-03-13 NOTE — Telephone Encounter (Signed)
-----   Message from Tammi Sou, [place] sent at 03/13/2020  4:32 PM EDT ----- Pt notified of CT results and Dr. Marliss Coots comments. Pt agrees with GI referral would like to see someone in [place], pt said today her sxs are better, she hasn't had any diarrhea lately but she did have some recent right side pain. Pt advised [facility] will call to schedule GI appt

## 2020-03-14 ENCOUNTER — Encounter: Payer: Self-pay | Admitting: Gastroenterology

## 2020-04-19 ENCOUNTER — Ambulatory Visit: Payer: Medicare Other | Admitting: Gastroenterology

## 2020-04-28 ENCOUNTER — Other Ambulatory Visit: Payer: Self-pay | Admitting: Family Medicine

## 2020-04-29 ENCOUNTER — Other Ambulatory Visit: Payer: Self-pay | Admitting: Family Medicine

## 2020-05-11 DIAGNOSIS — Z23 Encounter for immunization: Secondary | ICD-10-CM | POA: Diagnosis not present

## 2020-05-17 ENCOUNTER — Encounter: Payer: Self-pay | Admitting: Gastroenterology

## 2020-05-17 ENCOUNTER — Ambulatory Visit (INDEPENDENT_AMBULATORY_CARE_PROVIDER_SITE_OTHER): Payer: Medicare Other | Admitting: Gastroenterology

## 2020-05-17 VITALS — BP 144/90 | HR 63 | Ht 63.0 in | Wt 180.0 lb

## 2020-05-17 DIAGNOSIS — R1084 Generalized abdominal pain: Secondary | ICD-10-CM | POA: Insufficient documentation

## 2020-05-17 DIAGNOSIS — R197 Diarrhea, unspecified: Secondary | ICD-10-CM

## 2020-05-17 DIAGNOSIS — R933 Abnormal findings on diagnostic imaging of other parts of digestive tract: Secondary | ICD-10-CM

## 2020-05-17 MED ORDER — CHOLESTYRAMINE 4 G PO PACK: 4.0000 g | PACK | Freq: Every day | ORAL | 5 refills | Status: DC

## 2020-05-17 MED ORDER — HYOSCYAMINE SULFATE 0.125 MG SL SUBL: 0.1250 mg | SUBLINGUAL_TABLET | SUBLINGUAL | 1 refills | Status: DC | PRN

## 2020-05-17 NOTE — Patient Instructions (Addendum)
If you are age 84 or older, your body mass index should be between 23-30. Your Body mass index is 31.89 kg/m. If this is out of the aforementioned range listed, please consider follow up with your Primary Care Provider.  If you are age 61 or younger, your body mass index should be between 19-25. Your Body mass index is 31.89 kg/m. If this is out of the aformentioned range listed, please consider follow up with your Primary Care Provider.   START Cholesytramine 1 packet daily START hyoscyamine every 6 hours as needed  You have been scheduled to follow up with Dr. Henrene Pastor on July 05, 2020 at 3:20 pm

## 2020-05-17 NOTE — Progress Notes (Signed)
05/17/2020 Linda Mcgrath [id number] 1933-04-12   HISTORY OF PRESENT ILLNESS: This is a pleasant 86 year old female is a patient of Dr. Lamar Sprinkles.  She has not been seen here since her last colonoscopy in 2014.  Last office visit was in 2012.  She is here today at the request of her PCP, Dr. Milinda Antis, for evaluation regarding right-sided abdominal pain.  She tells me that she is not having pain currently.  She says that when the pain is present it is on the right side sometimes upper, sometimes lower.  It comes and goes.  Looking back it appears that she was complaining of right-sided abdominal pain at the time of her last office visit in 2012.  She had a CT scan of the abdomen and pelvis with contrast performed in July of this year that showed the following:  IMPRESSION: Circumferential duodenal wall thickening involving the second and third portions of the duodenum in the midline, nonspecific but suggesting duodenitis.  Chronic appearing left basilar pleuroparenchymal scarring.  Benign-appearing hepatic cysts  Remote cholecystectomy and hysterectomy  Diverticulosis without acute inflammatory process  Aortic Atherosclerosis (ICD10-I70.0).   She also reports intermittent diarrhea.  She says that she had a normal bowel movement today.  Diarrhea has been intermittent for the past 6 months or so.  She says that when she has diarrhea she will usually have 3-4 bowel movements a day.  This will last symptoms 4-5 days at a time before returning to normal.  She had negative C. difficile and normal stool culture in early July.  Unfortunately now she is on clindamycin for an abscessed tooth.  Once again, she did report a normal bowel movement today.  She takes 2 Metamucil fiber capsules every day.  Her last colonoscopy in July 2014 at which time she was found to have diverticulosis and a diminutive polyp removed that was a tubular adenoma.    Past Medical History:  Diagnosis Date    . Allergic rhinitis   . Arthritis   . Asthma   . Cataract    Bil/lens implant  . Colon polyp 1999   small polyp  . Diverticulosis   . Dysrhythmia   . Fibromyalgia   . Hyperlipidemia   . Hypothyroidism   . Internal hemorrhoids   . Leg cramps   . Lichen planus   . Lung nodule    stable LUL 9 mm  . Menopausal syndrome   . Osteoarthritis   . UTI (urinary tract infection)    Past Surgical History:  Procedure Laterality Date  . Abd U/S  11/1998   negative  . Abd U/S  01/2001   gallbladder polyps  . ABDOMINAL HYSTERECTOMY  1975   total-fibroids  . APPENDECTOMY  1975  . BREAST BIOPSY     benign  . BREAST EXCISIONAL BIOPSY Left 1957  . CARDIOVERSION N/A 03/07/2016   Procedure: CARDIOVERSION;  Surgeon: Yates Decamp, MD;  Location: [facility] ENDOSCOPY;  Service: Cardiovascular;  Laterality: N/A;  . CATARACT EXTRACTION     bilateral  . CHOLECYSTECTOMY    . COLONOSCOPY  1998   Diverticulosis; polyp\  . COLONOSCOPY  09/2002   Diverticulosis, hem  . COLONOSCOPY  12/2007   diverticulosis, polyp  . DEXA  10/2001   osteopenia  . ELECTROPHYSIOLOGIC STUDY N/A 04/03/2016   Procedure: A-Flutter Ablation;  Surgeon: Will [name], MD;  Location: [facility] INVASIVE CV LAB;  Service: Cardiovascular;  Laterality: N/A;  . ESOPHAGOGASTRODUODENOSCOPY  2004  . Hida scan  01/2001  Negative  . KNEE SURGERY     right knee / 11/2004  . LAMINECTOMY WITH POSTERIOR LATERAL ARTHRODESIS LEVEL 2 N/A 07/31/2017   Procedure: DECOMPRESSIVE LAMINECTOMY LUMABR FOUR-FIVE, LUMBAR FIVE-SACRAL ONE;  Surgeon: Tia Alert, MD;  Location: [facility] OR;  Service: Neurosurgery;  Laterality: N/A;  . laser surgery for glaucoma Left Sep 21, 2014  . MULTIPLE TOOTH EXTRACTIONS    . ROTATOR CUFF REPAIR     x 2 /right shoulder  . SKIN CANCER EXCISION     pre-melanoma / on face  . TEE WITHOUT CARDIOVERSION N/A 03/07/2016   Procedure: TRANSESOPHAGEAL ECHOCARDIOGRAM (TEE);  Surgeon: Yates Decamp, MD;  Location: [facility] ENDOSCOPY;  Service:  Cardiovascular;  Laterality: N/A;  . TOE SURGERY     rt foot     reports that she has never smoked. She has never used smokeless tobacco. She reports that she does not drink alcohol and does not use drugs. family history includes Allergies in her mother; Colon cancer in her brother; Heart disease in her mother; Heart failure in her son; Kidney failure in her son; Parkinsonism in her father. Allergies  Allergen Reactions  . Shellfish Allergy Anaphylaxis and Nausea And Vomiting  . Tetracycline Hives, Nausea And Vomiting, Rash and Other (See Comments)    SYSTEMIC REACTION: heart palpitations,muscle spasms, vomiting  . Amlodipine Besylate Other (See Comments)    REACTION: muscle spasms, extremity swelling, low bp  . Ciprofloxacin Other (See Comments)    REACTION: muscle spasms, insomnia  . Codeine Other (See Comments)    REACTION: hallucinations  . Fosamax [Alendronate Sodium]   . Propoxyphene Hcl Other (See Comments)    Unknown  . Sulfamethoxazole-Trimethoprim Nausea Only and Other (See Comments)    REACTION: dizzy, could not focus eyes and could not concentrate and nausea  . Tape Other (See Comments)    BANDAIDS TAKE OFF HER SKIN; PLEASE USE COBAN OR PAPER   . Xarelto [Rivaroxaban] Other (See Comments)    Aches and pains and nervousness  . Amoxicillin Rash  . 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 Penicillins Swelling, Rash and Other (See Comments)    Has patient had a PCN reaction causing immediate rash, facial/tongue/throat swelling, SOB or lightheadedness with hypotension: Yes Has patient had a PCN reaction causing severe rash involving mucus membranes or skin necrosis: No Has patient had a PCN reaction that required hospitalization No Has patient had a PCN reaction occurring within the last 10 years: No If all  of the above answers are "NO", then may proceed with Cephalosporin use.       Outpatient Encounter Medications as of 05/17/2020  Medication Sig  . Ascorbic Acid (VITAMIN C PO) Take 500 mg by mouth daily.  . B Complex Vitamins (VITAMIN B COMPLEX PO) Take 1 tablet by mouth daily.  . B COMPLEX, FOLIC ACID, PO Take 666 mcg by mouth daily.  . Biotin 10 MG TABS Take 1 tablet by mouth every morning.   . Calcium Citrate-Vitamin D (CALCIUM CITRATE + D) 250-200 MG-UNIT TABS Take 1 tablet by mouth daily.  . clindamycin (CLEOCIN) 300 MG capsule Take 300 mg by mouth 3 (three) times daily.  . Cranberry 180 MG CAPS Take 2 capsules by mouth daily.  Marland Kitchen estradiol (ESTRACE) 1 MG tablet TAKE 1 TABLET BY MOUTH EVERY DAY  .  famotidine (PEPCID) 20 MG tablet Take 1 tablet (20 mg total) by mouth 2 (two) times daily.  Marland Kitchen Fexofenadine HCl (MUCINEX ALLERGY PO) Take 1 tablet by mouth daily as needed (Allergies).   . folic acid (FOLVITE) 1 MG tablet Take 1 mg by mouth daily.  Marland Kitchen levocetirizine (XYZAL ALLERGY 24HR) 5 MG tablet Take 5 mg by mouth every evening.  Marland Kitchen levothyroxine (SYNTHROID) 88 MCG tablet Take 1 tablet (88 mcg total) by mouth daily with breakfast.  . loratadine (CLARITIN) 10 MG tablet Take 10 mg by mouth daily as needed for allergies.   Marland Kitchen MAGNESIUM-OXIDE PO Take 1 tablet by mouth daily.  . Methenamine-Sodium Salicylate (AZO URINARY TRACT DEFENSE PO) Take 1 capsule by mouth daily.  . methocarbamol (ROBAXIN) 500 MG tablet TAKE 1 TABLET (500 MG TOTAL) BY MOUTH EVERY 8 (EIGHT) HOURS AS NEEDED FOR MUSCLE SPASMS. CAUTION OF SEDATION  . metroNIDAZOLE (FLAGYL) 500 MG tablet Take 1 tablet (500 mg total) by mouth 3 (three) times daily.  . Multiple Vitamin (MULTIVITAMIN) capsule Take 1 capsule by mouth daily.    . Psyllium (METAMUCIL FIBER PO) Take by mouth at bedtime. 2 tablespoon  . Pumpkin Seed-Soy Germ (AZO BLADDER CONTROL/GO-LESS PO) Take 1 tablet by mouth 2 (two) times a week.  . TURMERIC PO Take 1 capsule by  mouth 2 (two) times daily.   . vitamin B-12 (CYANOCOBALAMIN) 500 MCG tablet Take 500 mcg by mouth daily.  . Vitamin D, Cholecalciferol, 50 MCG (2000 UT) CAPS Take 2 tablets by mouth daily.  . [DISCONTINUED] Ascorbic Acid (VITAMIN C) 1000 MG tablet Take 1,000 mg by mouth daily.    . [DISCONTINUED] cholecalciferol (VITAMIN D) 1000 units tablet Take 1,000 Units by mouth daily.  . [DISCONTINUED] glucosamine-chondroitin 500-400 MG tablet Take 2 tablets by mouth daily.  . [DISCONTINUED] VITAMIN D PO Take 4,000 Units by mouth daily.   No facility-administered encounter medications on file as of 05/17/2020.     REVIEW OF SYSTEMS  : All other systems reviewed and negative except where noted in the History of Present Illness.   PHYSICAL EXAM: BP (!) 144/90   Pulse 63   Ht 5\' 3"  (1.6 m)   Wt 180 lb (81.6 kg)   LMP 08/26/1969   BMI 31.89 kg/m  General: Well developed white female in no acute distress Head: Normocephalic and atraumatic Eyes:  Sclerae anicteric, conjunctiva pink. Ears: Normal auditory acuity Lungs: Clear throughout to auscultation; no W/R/R. Heart: Regular rate and rhythm; no M/R/G. Abdomen: Soft, non-distended.  BS present.  Non-tender. Musculoskeletal: Symmetrical with no gross deformities  Skin: No lesions on visible extremities Extremities: No edema  Neurological: Alert oriented x 4, grossly non-focal Psychological:  Alert and cooperative. Normal mood and affect  ASSESSMENT AND PLAN: *86 year old female with complaints of intermittent diarrhea and right sided abdominal pain.  Diarrhea could be bile salt related.  Diarrhea and abdominal pain could be IBS.  We will try Questran 1 packet daily and Levsin as needed.  Prescription sent to pharmacy.  She will follow up in 6 to 8 weeks with either Dr. Marina Goodell or myself. *Abnormal CT scan showing circumferential duodenal wall thickening involving the second and third portion of the duodenum in the midline, nonspecific but suggesting  duodenitis: Unsure of the significance of this and correlation with her symptoms.  Her pain is right-sided, sometimes right upper quadrant, sometimes right lower quadrant, and comes and goes.  No pain currently.   CC:  [name], Audrie Gallus, MD

## 2020-05-18 DIAGNOSIS — R933 Abnormal findings on diagnostic imaging of other parts of digestive tract: Secondary | ICD-10-CM | POA: Insufficient documentation

## 2020-05-22 NOTE — Progress Notes (Signed)
Initial assessment and plan reviewed

## 2020-05-31 ENCOUNTER — Ambulatory Visit (INDEPENDENT_AMBULATORY_CARE_PROVIDER_SITE_OTHER): Payer: Medicare Other | Admitting: Family Medicine

## 2020-05-31 ENCOUNTER — Encounter: Payer: Self-pay | Admitting: Family Medicine

## 2020-05-31 ENCOUNTER — Other Ambulatory Visit: Payer: Self-pay

## 2020-05-31 VITALS — BP 130/78 | HR 52 | Temp 97.3°F | Ht 63.0 in | Wt 179.4 lb

## 2020-05-31 DIAGNOSIS — K2980 Duodenitis without bleeding: Secondary | ICD-10-CM

## 2020-05-31 DIAGNOSIS — E7800 Pure hypercholesterolemia, unspecified: Secondary | ICD-10-CM

## 2020-05-31 DIAGNOSIS — I700 Atherosclerosis of aorta: Secondary | ICD-10-CM | POA: Diagnosis not present

## 2020-05-31 DIAGNOSIS — K580 Irritable bowel syndrome with diarrhea: Secondary | ICD-10-CM | POA: Diagnosis not present

## 2020-05-31 DIAGNOSIS — I10 Essential (primary) hypertension: Secondary | ICD-10-CM | POA: Diagnosis not present

## 2020-05-31 DIAGNOSIS — R197 Diarrhea, unspecified: Secondary | ICD-10-CM

## 2020-05-31 LAB — COMPREHENSIVE METABOLIC PANEL
ALT: 21 U/L (ref 0–35)
AST: 24 U/L (ref 0–37)
Albumin: 4.1 g/dL (ref 3.5–5.2)
Alkaline Phosphatase: 49 U/L (ref 39–117)
BUN: 20 mg/dL (ref 6–23)
CO2: 28 mEq/L (ref 19–32)
Calcium: 9.4 mg/dL (ref 8.4–10.5)
Chloride: 103 mEq/L (ref 96–112)
Creatinine, Ser: 1.09 mg/dL (ref 0.40–1.20)
GFR: 46.2 mL/min — ABNORMAL LOW (ref >60.00)
Glucose, Bld: 85 mg/dL (ref 70–99)
Potassium: 5 mEq/L (ref 3.5–5.1)
Sodium: 137 mEq/L (ref 135–145)
Total Bilirubin: 0.7 mg/dL (ref 0.2–1.2)
Total Protein: 7 g/dL (ref 6.0–8.3)

## 2020-05-31 LAB — LIPID PANEL
Cholesterol: 194 mg/dL (ref 0–200)
HDL: 64.4 mg/dL (ref >39.00)
LDL Cholesterol: 107 mg/dL — ABNORMAL HIGH (ref 0–99)
NonHDL: 129.51
Total CHOL/HDL Ratio: 3
Triglycerides: 113 mg/dL (ref 0.0–149.0)
VLDL: 22.6 mg/dL (ref 0.0–40.0)

## 2020-05-31 NOTE — Patient Instructions (Addendum)
Get your covid booster   Stay on current medicines  Take care of yourself  Keep up with dental work   Labs today for cholesterol   So glad you are doing better

## 2020-05-31 NOTE — Assessment & Plan Note (Signed)
Now has aortic atherosclerosis seen on CT scan  Disc imp of better control On questran - ? May help  Also eating better Lipid check today  Discussed diet  May need treatment if high

## 2020-05-31 NOTE — Progress Notes (Signed)
Subjective:    Patient ID: Linda Mcgrath, female    DOB: 15-Oct-1932, 86 y.o.   MRN: [id number]  This visit occurred during the SARS-CoV-2 public health emergency.  Safety protocols were in place, including screening questions prior to the visit, additional usage of staff PPE, and extensive cleaning of exam room while observing appropriate contact time as indicated for disinfecting solutions.    HPI Pt presents for f/u of GI visit  Wt Readings from Last 3 Encounters:  05/31/20 179 lb 7 oz (81.4 kg)  05/17/20 180 lb (81.6 kg)  12/15/19 184 lb 4 oz (83.6 kg)   31.79 kg/m  She was seen on 9/22 by Dr Marina Goodell and PA for R sided abdominal pain and diarrhea  Has h/o diverticulosis and duodenal wall thickening on last CT scan   Disc poss of bile salt related problem and/or IBS Px questran and levsin with f/u in 6-8 weeks   She has resolution of pain and diarrhea with the questran itself  Feels much better !    Last colonoscopy was in 2014 - diverticulosis and small polyp Stool c diff test from July is negative as well as stool culture   She takes pepcid Also metamucil   CT also showed aortic atherosclerosis   Lipids  Lab Results  Component Value Date   CHOL 227 (H) 03/23/2019   HDL 68.40 03/23/2019   LDLCALC 128 (H) 03/23/2019   LDLDIRECT 125.7 06/09/2013   TRIG 153.0 (H) 03/23/2019   CHOLHDL 3 03/23/2019  diet is fair  She is not eating as much /more balanced as well  Oatmeal at least 4 times per week / also special K  Eggs/toast  Rarely has fatty/fried foods   Had to stop glucosamine-bothered her stomach   bp is stable today  No cp or palpitations or headaches or edema  No side effects to medicines  BP Readings from Last 3 Encounters:  05/31/20 130/78  05/17/20 (!) 144/90  12/15/19 (!) 146/88    Having dental work done Several extractions Doing well   Patient Active Problem List   Diagnosis Date Noted  . Aortic atherosclerosis (HCC) 05/31/2020    . Abnormal CT scan, small bowel 05/18/2020  . Generalized abdominal pain 05/17/2020  . Duodenitis 03/13/2020  . Right lateral abdominal pain 03/07/2020  . Insomnia 12/15/2019  . Chronic low back pain 10/18/2019  . Positive depression screening 10/18/2019  . Osteoarthritis of right knee 08/02/2019  . Diarrhea 07/03/2018  . Dyspepsia 07/03/2018  . S/P lumbar spinal fusion 07/31/2017  . Right low back pain 03/04/2017  . H/O atrial flutter 03/06/2016  . Obesity 11/06/2015  . Urge incontinence 01/24/2015  . Estrogen deficiency 10/25/2014  . Lichen planus 12/20/2013  . Encounter for Medicare annual wellness exam 06/17/2013  . Screening mammogram, encounter for 06/15/2012  . Prediabetes 06/03/2011  . HYPERTENSION, BENIGN ESSENTIAL 10/23/2010  . CONSTIPATION 10/23/2010  . OSTEOARTHRITIS, HANDS, BILATERAL 01/24/2010  . Hypothyroidism 08/24/2008  . Vitamin D deficiency 06/06/2008  . Hyperlipidemia 06/06/2008  . MENOPAUSAL SYNDROME 03/29/2008  . Osteopenia 03/29/2008  . ALLERGIC RHINITIS 01/25/2008  . IBS 01/25/2008  . OSTEOARTHRITIS 01/25/2008  . FIBROMYALGIA 01/25/2008   Past Medical History:  Diagnosis Date  . Allergic rhinitis   . Arthritis   . Asthma   . Cataract    Bil/lens implant  . Colon polyp 1999   small polyp  . Diverticulosis   . Dysrhythmia   . Fibromyalgia   . Hyperlipidemia   .  Hypothyroidism   . Internal hemorrhoids   . Leg cramps   . Lichen planus   . Lung nodule    stable LUL 9 mm  . Menopausal syndrome   . Osteoarthritis   . UTI (urinary tract infection)    Past Surgical History:  Procedure Laterality Date  . Abd U/S  11/1998   negative  . Abd U/S  01/2001   gallbladder polyps  . ABDOMINAL HYSTERECTOMY  1975   total-fibroids  . APPENDECTOMY  1975  . BREAST BIOPSY     benign  . BREAST EXCISIONAL BIOPSY Left 1957  . CARDIOVERSION N/A 03/07/2016   Procedure: CARDIOVERSION;  Surgeon: Yates Decamp, MD;  Location: [facility] ENDOSCOPY;  Service:  Cardiovascular;  Laterality: N/A;  . CATARACT EXTRACTION     bilateral  . CHOLECYSTECTOMY    . COLONOSCOPY  1998   Diverticulosis; polyp\  . COLONOSCOPY  09/2002   Diverticulosis, hem  . COLONOSCOPY  12/2007   diverticulosis, polyp  . DEXA  10/2001   osteopenia  . ELECTROPHYSIOLOGIC STUDY N/A 04/03/2016   Procedure: A-Flutter Ablation;  Surgeon: Will [name], MD;  Location: [facility] INVASIVE CV LAB;  Service: Cardiovascular;  Laterality: N/A;  . ESOPHAGOGASTRODUODENOSCOPY  2004  . Hida scan  01/2001   Negative  . KNEE SURGERY     right knee / 11/2004  . LAMINECTOMY WITH POSTERIOR LATERAL ARTHRODESIS LEVEL 2 N/A 07/31/2017   Procedure: DECOMPRESSIVE LAMINECTOMY LUMABR FOUR-FIVE, LUMBAR FIVE-SACRAL ONE;  Surgeon: Tia Alert, MD;  Location: [facility] OR;  Service: Neurosurgery;  Laterality: N/A;  . laser surgery for glaucoma Left Sep 21, 2014  . MULTIPLE TOOTH EXTRACTIONS    . ROTATOR CUFF REPAIR     x 2 /right shoulder  . SKIN CANCER EXCISION     pre-melanoma / on face  . TEE WITHOUT CARDIOVERSION N/A 03/07/2016   Procedure: TRANSESOPHAGEAL ECHOCARDIOGRAM (TEE);  Surgeon: Yates Decamp, MD;  Location: [facility] ENDOSCOPY;  Service: Cardiovascular;  Laterality: N/A;  . TOE SURGERY     rt foot    Social History   Tobacco Use  . Smoking status: Never Smoker  . Smokeless tobacco: Never Used  Vaping Use  . Vaping Use: Never used  Substance Use Topics  . Alcohol use: No    Alcohol/week: 0.0 standard drinks    Comment: occasional-rare  . Drug use: No   Family History  Problem Relation Age of Onset  . Parkinsonism Father   . Colon cancer Brother   . Allergies Mother   . Heart disease Mother   . Kidney failure Son        congenital  (kidney transplant)   . Heart failure Son        died suddenly of CHF    Allergies  Allergen Reactions  . Shellfish Allergy Anaphylaxis and Nausea And Vomiting  . Tetracycline Hives, Nausea And Vomiting, Rash and Other (See Comments)    SYSTEMIC REACTION:  heart palpitations,muscle spasms, vomiting  . Amlodipine Besylate Other (See Comments)    REACTION: muscle spasms, extremity swelling, low bp  . Ciprofloxacin Other (See Comments)    REACTION: muscle spasms, insomnia  . Codeine Other (See Comments)    REACTION: hallucinations  . Fosamax [Alendronate Sodium]   . Propoxyphene Hcl Other (See Comments)    Unknown  . Sulfamethoxazole-Trimethoprim Nausea Only and Other (See Comments)    REACTION: dizzy, could not focus eyes and could not concentrate and nausea  . Tape Other (See Comments)    BANDAIDS  TAKE OFF HER SKIN; PLEASE USE COBAN OR PAPER   . Xarelto [Rivaroxaban] Other (See Comments)    Aches and pains and nervousness  . Amoxicillin Rash  . 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 Penicillins Swelling, Rash and Other (See Comments)    Has patient had a PCN reaction causing immediate rash, facial/tongue/throat swelling, SOB or lightheadedness with hypotension: Yes Has patient had a PCN reaction causing severe rash involving mucus membranes or skin necrosis: No Has patient had a PCN reaction that required hospitalization No Has patient had a PCN reaction occurring within the last 10 years: No If all of the above answers are "NO", then may proceed with Cephalosporin use.    Current Outpatient Medications on File Prior to Visit  Medication Sig Dispense Refill  . Ascorbic Acid (VITAMIN C PO) Take 500 mg by mouth daily.    . B Complex Vitamins (VITAMIN B COMPLEX PO) Take 1 tablet by mouth daily.    . B COMPLEX, FOLIC ACID, PO Take 666 mcg by mouth daily.    . Biotin 10 MG TABS Take 1 tablet by mouth every morning.     . Calcium Citrate-Vitamin D (CALCIUM CITRATE + D) 250-200 MG-UNIT TABS Take 1 tablet by mouth daily.    . cholestyramine (QUESTRAN) 4 g packet Take 1  packet (4 g total) by mouth daily. 30 each 5  . Cranberry 180 MG CAPS Take 2 capsules by mouth daily.    Marland Kitchen estradiol (ESTRACE) 1 MG tablet TAKE 1 TABLET BY MOUTH EVERY DAY 90 tablet 0  . famotidine (PEPCID) 20 MG tablet Take 1 tablet (20 mg total) by mouth 2 (two) times daily. 60 tablet 3  . Fexofenadine HCl (MUCINEX ALLERGY PO) Take 1 tablet by mouth daily as needed (Allergies).     . folic acid (FOLVITE) 1 MG tablet Take 1 mg by mouth daily.    . hyoscyamine (LEVSIN SL) 0.125 MG SL tablet Place 1 tablet (0.125 mg total) under the tongue every 4 (four) hours as needed. 30 tablet 1  . levothyroxine (SYNTHROID) 88 MCG tablet Take 1 tablet (88 mcg total) by mouth daily with breakfast. 90 tablet 3  . loratadine (CLARITIN) 10 MG tablet Take 10 mg by mouth daily as needed for allergies.     Marland Kitchen MAGNESIUM-OXIDE PO Take 1 tablet by mouth daily.    . Methenamine-Sodium Salicylate (AZO URINARY TRACT DEFENSE PO) Take 1 capsule by mouth daily.    . methocarbamol (ROBAXIN) 500 MG tablet TAKE 1 TABLET (500 MG TOTAL) BY MOUTH EVERY 8 (EIGHT) HOURS AS NEEDED FOR MUSCLE SPASMS. CAUTION OF SEDATION 30 tablet 1  . Multiple Vitamin (MULTIVITAMIN) capsule Take 1 capsule by mouth daily.      . Psyllium (METAMUCIL FIBER PO) Take by mouth at bedtime. 2 tablespoon    . Pumpkin Seed-Soy Germ (AZO BLADDER CONTROL/GO-LESS PO) Take 1 tablet by mouth 2 (two) times a week.    . TURMERIC PO Take 1 capsule by mouth 2 (two) times daily.     . vitamin B-12 (CYANOCOBALAMIN) 500 MCG tablet Take 500 mcg by mouth daily.    . Vitamin D, Cholecalciferol, 50 MCG (2000 UT) CAPS Take 2 tablets by mouth daily.     No current facility-administered medications on file prior to visit.  Review of Systems  Constitutional: Negative for activity change, appetite change, fatigue, fever and unexpected weight change.  HENT: Negative for congestion, ear pain, rhinorrhea, sinus pressure and sore throat.   Eyes: Negative for pain, redness and  visual disturbance.  Respiratory: Negative for cough, shortness of breath and wheezing.   Cardiovascular: Negative for chest pain and palpitations.  Gastrointestinal: Positive for diarrhea. Negative for abdominal pain, blood in stool, constipation, nausea and vomiting.       Diarrhea is much improved  Endocrine: Negative for polydipsia and polyuria.  Genitourinary: Negative for dysuria, frequency and urgency.  Musculoskeletal: Negative for arthralgias, back pain and myalgias.  Skin: Negative for pallor and rash.  Allergic/Immunologic: Negative for environmental allergies.  Neurological: Negative for dizziness, syncope and headaches.  Hematological: Negative for adenopathy. Does not bruise/bleed easily.  Psychiatric/Behavioral: Negative for decreased concentration and dysphoric mood. The patient is not nervous/anxious.        Objective:   Physical Exam Constitutional:      General: She is not in acute distress.    Appearance: Normal appearance. She is well-developed. She is obese. She is not ill-appearing or diaphoretic.  HENT:     Head: Normocephalic and atraumatic.     Mouth/Throat:     Mouth: Mucous membranes are moist.  Eyes:     General: No scleral icterus.    Conjunctiva/sclera: Conjunctivae normal.     Pupils: Pupils are equal, round, and reactive to light.  Cardiovascular:     Rate and Rhythm: Regular rhythm. Bradycardia present.     Heart sounds: Normal heart sounds.  Pulmonary:     Effort: Pulmonary effort is normal. No respiratory distress.     Breath sounds: Normal breath sounds. No wheezing or rales.  Abdominal:     General: Bowel sounds are normal. There is no distension.     Palpations: Abdomen is soft. There is no mass.     Tenderness: There is no abdominal tenderness. There is no guarding or rebound.     Hernia: No hernia is present.  Musculoskeletal:     Cervical back: Normal range of motion and neck supple.     Right lower leg: No edema.     Left lower  leg: No edema.  Lymphadenopathy:     Cervical: No cervical adenopathy.  Skin:    General: Skin is warm and dry.     Coloration: Skin is not pale.     Findings: No erythema.  Neurological:     Mental Status: She is alert.     Coordination: Coordination normal.     Gait: Gait normal.  Psychiatric:        Mood and Affect: Mood normal.           Assessment & Plan:   Problem List Items Addressed This Visit      Cardiovascular and Mediastinum   HYPERTENSION, BENIGN ESSENTIAL    bp in fair control at this time  BP Readings from Last 1 Encounters:  05/31/20 130/78   No medications needed/ controlled with lifestyle change Most recent labs reviewed  Disc lifstyle change with low sodium diet and exercise        Relevant Orders   Comprehensive metabolic panel   Aortic atherosclerosis (HCC)    Incidental finding on CT scan No symptoms  Stressed importance of good bp and cholesterol control  Lipids checked today        Digestive   IBS   Duodenitis    Seen on CT  scan  GI symptoms are much improved Reassuring exam  For f/u with Dr Marina Goodell next mo        Other   Hyperlipidemia    Now has aortic atherosclerosis seen on CT scan  Disc imp of better control On questran - ? May help  Also eating better Lipid check today  Discussed diet  May need treatment if high       Relevant Orders   Comprehensive metabolic panel   Lipid panel   Diarrhea - Primary    This has improved greatly with questran  No abdominal /GI symptoms  Rev last CT and colonoscopy as well as last GI visit  Reassuring  Good diet Will f/u as planned with GI next mo

## 2020-05-31 NOTE — Assessment & Plan Note (Signed)
Incidental finding on CT scan No symptoms  Stressed importance of good bp and cholesterol control  Lipids checked today

## 2020-05-31 NOTE — Assessment & Plan Note (Signed)
bp in fair control at this time  BP Readings from Last 1 Encounters:  05/31/20 130/78   No medications needed/ controlled with lifestyle change Most recent labs reviewed  Disc lifstyle change with low sodium diet and exercise

## 2020-05-31 NOTE — Assessment & Plan Note (Signed)
Seen on CT scan  GI symptoms are much improved Reassuring exam  For f/u with Dr Henrene Pastor next mo

## 2020-05-31 NOTE — Assessment & Plan Note (Signed)
This has improved greatly with questran  No abdominal /GI symptoms  Rev last CT and colonoscopy as well as last GI visit  Reassuring  Good diet Will f/u as planned with GI next mo

## 2020-06-02 ENCOUNTER — Encounter: Payer: Self-pay | Admitting: *Deleted

## 2020-06-10 ENCOUNTER — Ambulatory Visit: Payer: Medicare Other | Attending: Internal Medicine

## 2020-06-10 DIAGNOSIS — Z23 Encounter for immunization: Secondary | ICD-10-CM

## 2020-06-10 NOTE — Progress Notes (Signed)
Covid-19 Vaccination Clinic  Name:  Linda Mcgrath    MRN: [id number] DOB: [birth year]  06/10/2020  Linda Mcgrath was observed post Covid-19 immunization for 15 minutes without incident. She was provided with Vaccine Information Sheet and instruction to access the V-Safe system.   Linda Mcgrath was instructed to call 911 with any severe reactions post vaccine: Marland Kitchen Difficulty breathing  . Swelling of face and throat  . A fast heartbeat  . A bad rash all over body  . Dizziness and weakness

## 2020-06-17 ENCOUNTER — Other Ambulatory Visit: Payer: Self-pay | Admitting: Family Medicine

## 2020-07-05 ENCOUNTER — Ambulatory Visit (INDEPENDENT_AMBULATORY_CARE_PROVIDER_SITE_OTHER): Payer: Medicare Other | Admitting: Internal Medicine

## 2020-07-05 ENCOUNTER — Encounter: Payer: Self-pay | Admitting: Internal Medicine

## 2020-07-05 VITALS — BP 120/78 | HR 67 | Ht 63.0 in | Wt 179.0 lb

## 2020-07-05 DIAGNOSIS — R1031 Right lower quadrant pain: Secondary | ICD-10-CM | POA: Diagnosis not present

## 2020-07-05 DIAGNOSIS — R933 Abnormal findings on diagnostic imaging of other parts of digestive tract: Secondary | ICD-10-CM

## 2020-07-05 DIAGNOSIS — R197 Diarrhea, unspecified: Secondary | ICD-10-CM | POA: Diagnosis not present

## 2020-07-05 MED ORDER — CHOLESTYRAMINE 4 G PO PACK: 4.0000 g | PACK | Freq: Every day | ORAL | 5 refills | Status: DC

## 2020-07-05 NOTE — Patient Instructions (Signed)
We have sent the following medications to your pharmacy for you to pick up at your convenience:  Questran  Please follow up as needed

## 2020-07-05 NOTE — Progress Notes (Signed)
HISTORY OF PRESENT ILLNESS:  Linda Mcgrath is a 86 y.o. female with past medical history as listed below who was evaluated by the GI physician assistant May 17, 2020 regarding abdominal pain and loose stools.  She was placed on Questran.  Given sublingual Levsin.  Presents today for follow-up.  She is pleased to report that her problems with diarrhea have resolved.  Now with 2 or 3 formed bowel movements per day.  No significant abdominal pain.  She does have occasional right lower quadrant pain that she has had for many years (documented in my note in 2012) likely related to adhesions.  No new problems or complaints.  Interval blood work includes normal comprehensive metabolic panel with normal liver tests.  She has completed her Covid vaccination series and booster  REVIEW OF SYSTEMS:  All non-GI ROS negative unless otherwise stated in the HPI except for allergies, arthritis, back pain, cough, fatigue, sleeping problems, muscle cramps, urinary frequency, excessive urination, urinary leakage  Past Medical History:  Diagnosis Date  . Allergic rhinitis   . Arthritis   . Asthma   . Cataract    Bil/lens implant  . Colon polyp 1999   small polyp  . Diverticulosis   . Dysrhythmia   . Fibromyalgia   . Hyperlipidemia   . Hypothyroidism   . Internal hemorrhoids   . Leg cramps   . Lichen planus   . Lung nodule    stable LUL 9 mm  . Menopausal syndrome   . Osteoarthritis   . UTI (urinary tract infection)     Past Surgical History:  Procedure Laterality Date  . Abd U/S  11/1998   negative  . Abd U/S  01/2001   gallbladder polyps  . ABDOMINAL HYSTERECTOMY  1975   total-fibroids  . APPENDECTOMY  1975  . BREAST BIOPSY     benign  . BREAST EXCISIONAL BIOPSY Left 1957  . CARDIOVERSION N/A 03/07/2016   Procedure: CARDIOVERSION;  Surgeon: Adrian Prows, MD;  Location: [place];  Service: Cardiovascular;  Laterality: N/A;  . CATARACT EXTRACTION     bilateral  .  CHOLECYSTECTOMY    . COLONOSCOPY  1998   Diverticulosis; polyp\  . COLONOSCOPY  09/2002   Diverticulosis, hem  . COLONOSCOPY  12/2007   diverticulosis, polyp  . DEXA  10/2001   osteopenia  . ELECTROPHYSIOLOGIC STUDY N/A 04/03/2016   Procedure: A-Flutter Ablation;  Surgeon: Will Meredith Leeds, MD;  Location: [facility];  Service: Cardiovascular;  Laterality: N/A;  . ESOPHAGOGASTRODUODENOSCOPY  2004  . Hida scan  01/2001   Negative  . KNEE SURGERY     right knee / 11/2004  . LAMINECTOMY WITH POSTERIOR LATERAL ARTHRODESIS LEVEL 2 N/A 07/31/2017   Procedure: DECOMPRESSIVE LAMINECTOMY LUMABR FOUR-FIVE, LUMBAR FIVE-SACRAL ONE;  Surgeon: Eustace Moore, MD;  Location: [place];  Service: Neurosurgery;  Laterality: N/A;  . laser surgery for glaucoma Left Sep 21, 2014  . MULTIPLE TOOTH EXTRACTIONS    . ROTATOR CUFF REPAIR     x 2 /right shoulder  . SKIN CANCER EXCISION     pre-melanoma / on face  . TEE WITHOUT CARDIOVERSION N/A 03/07/2016   Procedure: TRANSESOPHAGEAL ECHOCARDIOGRAM (TEE);  Surgeon: Adrian Prows, MD;  Location: [place];  Service: Cardiovascular;  Laterality: N/A;  . TOE SURGERY     rt foot     Social History Linda Mcgrath  reports that she has never smoked. She has never used smokeless tobacco. She reports that she  does not drink alcohol and does not use drugs.  family history includes Allergies in her mother; Colon cancer in her brother; Heart disease in her mother; Heart failure in her son; Kidney failure in her son; Parkinsonism in her father.  Allergies  Allergen Reactions  . Shellfish Allergy Anaphylaxis and Nausea And Vomiting  . Tetracycline Hives, Nausea And Vomiting, Rash and Other (See Comments)    SYSTEMIC REACTION: heart palpitations,muscle spasms, vomiting  . Amlodipine Besylate Other (See Comments)    REACTION: muscle spasms, extremity swelling, low bp  . Ciprofloxacin Other (See Comments)    REACTION: muscle spasms, insomnia  . Codeine  Other (See Comments)    REACTION: hallucinations  . Fosamax [Alendronate Sodium]   . Propoxyphene Hcl Other (See Comments)    Unknown  . Sulfamethoxazole-Trimethoprim Nausea Only and Other (See Comments)    REACTION: dizzy, could not focus eyes and could not concentrate and nausea  . Tape Other (See Comments)    BANDAIDS TAKE OFF HER SKIN; PLEASE USE COBAN OR PAPER   . Xarelto [Rivaroxaban] Other (See Comments)    Aches and pains and nervousness  . Amoxicillin Rash  . 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 Penicillins Swelling, Rash and Other (See Comments)    Has patient had a PCN reaction causing immediate rash, facial/tongue/throat swelling, SOB or lightheadedness with hypotension: Yes Has patient had a PCN reaction causing severe rash involving mucus membranes or skin necrosis: No Has patient had a PCN reaction that required hospitalization No Has patient had a PCN reaction occurring within the last 10 years: No If all of the above answers are "NO", then may proceed with Cephalosporin use.        PHYSICAL EXAMINATION: Vital signs: BP 120/78   Pulse 67   Ht 5\' 3"  (1.6 m)   Wt 179 lb (81.2 kg)   LMP 08/26/1969   SpO2 96%   BMI 31.71 kg/m   Constitutional: Elderly female who is overweight but generally well-appearing, no acute distress Psychiatric: alert and oriented x3, cooperative Eyes: extraocular movements intact, anicteric, conjunctiva pink Mouth: oral pharynx moist, no lesions Neck: supple no lymphadenopathy Cardiovascular: heart regular rate and rhythm, no murmur Lungs: clear to auscultation bilaterally Abdomen: soft, obese, nontender, nondistended, no obvious ascites, no peritoneal signs, normal bowel sounds, no organomegaly Rectal: Omitted Extremities: no clubbing, cyanosis, or lower extremity  edema bilaterally Skin: no lesions on visible extremities Neuro: No focal deficits.  Cranial nerves intact  ASSESSMENT:  1.  Loose stools improved with Questran.  Question bile salt related diarrhea in a patient postcholecystectomy 2.  Chronic intermittent right lower quadrant pain.  Secondary adhesions 3.  General medical problems   PLAN: 1.  Continue Questran.  Medication risks and timing of medication reviewed.  Multiple refills prescribed.  Okay to try to eliminate Questran.  If diarrhea returns, she could resume 2.  GI follow-up as needed

## 2020-08-02 ENCOUNTER — Other Ambulatory Visit: Payer: Self-pay | Admitting: Family Medicine

## 2020-08-04 DIAGNOSIS — H401131 Primary open-angle glaucoma, bilateral, mild stage: Secondary | ICD-10-CM | POA: Diagnosis not present

## 2020-08-23 ENCOUNTER — Ambulatory Visit: Payer: Medicare Other | Admitting: Family Medicine

## 2020-08-28 ENCOUNTER — Ambulatory Visit: Payer: Medicare Other | Admitting: Family Medicine

## 2020-08-30 ENCOUNTER — Other Ambulatory Visit: Payer: Self-pay

## 2020-08-30 ENCOUNTER — Encounter: Payer: Self-pay | Admitting: Family Medicine

## 2020-08-30 ENCOUNTER — Ambulatory Visit (INDEPENDENT_AMBULATORY_CARE_PROVIDER_SITE_OTHER): Payer: Medicare Other | Admitting: Family Medicine

## 2020-08-30 VITALS — BP 135/80 | HR 59 | Temp 97.4°F | Ht 63.0 in | Wt 177.3 lb

## 2020-08-30 DIAGNOSIS — M549 Dorsalgia, unspecified: Secondary | ICD-10-CM

## 2020-08-30 DIAGNOSIS — N3941 Urge incontinence: Secondary | ICD-10-CM | POA: Diagnosis not present

## 2020-08-30 DIAGNOSIS — E039 Hypothyroidism, unspecified: Secondary | ICD-10-CM | POA: Diagnosis not present

## 2020-08-30 DIAGNOSIS — E7800 Pure hypercholesterolemia, unspecified: Secondary | ICD-10-CM

## 2020-08-30 DIAGNOSIS — I10 Essential (primary) hypertension: Secondary | ICD-10-CM

## 2020-08-30 DIAGNOSIS — G8929 Other chronic pain: Secondary | ICD-10-CM

## 2020-08-30 DIAGNOSIS — R7303 Prediabetes: Secondary | ICD-10-CM | POA: Diagnosis not present

## 2020-08-30 DIAGNOSIS — E6609 Other obesity due to excess calories: Secondary | ICD-10-CM

## 2020-08-30 DIAGNOSIS — M8589 Other specified disorders of bone density and structure, multiple sites: Secondary | ICD-10-CM | POA: Diagnosis not present

## 2020-08-30 DIAGNOSIS — E559 Vitamin D deficiency, unspecified: Secondary | ICD-10-CM | POA: Diagnosis not present

## 2020-08-30 DIAGNOSIS — I700 Atherosclerosis of aorta: Secondary | ICD-10-CM | POA: Diagnosis not present

## 2020-08-30 DIAGNOSIS — Z6831 Body mass index (BMI) 31.0-31.9, adult: Secondary | ICD-10-CM | POA: Diagnosis not present

## 2020-08-30 DIAGNOSIS — N951 Menopausal and female climacteric states: Secondary | ICD-10-CM

## 2020-08-30 LAB — CBC WITH DIFFERENTIAL/PLATELET
Basophils Absolute: 0.1 10*3/uL (ref 0.0–0.1)
Basophils Relative: 0.7 % (ref 0.0–3.0)
Eosinophils Absolute: 0.1 10*3/uL (ref 0.0–0.7)
Eosinophils Relative: 1.4 % (ref 0.0–5.0)
HCT: 44.4 % (ref 36.0–46.0)
Hemoglobin: 15.1 g/dL — ABNORMAL HIGH (ref 12.0–15.0)
Lymphocytes Relative: 38.6 % (ref 12.0–46.0)
Lymphs Abs: 3 10*3/uL (ref 0.7–4.0)
MCHC: 34 g/dL (ref 30.0–36.0)
MCV: 96.9 fl (ref 78.0–100.0)
Monocytes Absolute: 0.5 10*3/uL (ref 0.1–1.0)
Monocytes Relative: 7.1 % (ref 3.0–12.0)
Neutro Abs: 4.1 10*3/uL (ref 1.4–7.7)
Neutrophils Relative %: 52.2 % (ref 43.0–77.0)
Platelets: 258 10*3/uL (ref 150.0–400.0)
RBC: 4.58 Mil/uL (ref 3.87–5.11)
RDW: 13.2 % (ref 11.5–15.5)
WBC: 7.8 10*3/uL (ref 4.0–10.5)

## 2020-08-30 LAB — COMPREHENSIVE METABOLIC PANEL
ALT: 23 U/L (ref 0–35)
AST: 22 U/L (ref 0–37)
Albumin: 4.4 g/dL (ref 3.5–5.2)
Alkaline Phosphatase: 61 U/L (ref 39–117)
BUN: 21 mg/dL (ref 6–23)
CO2: 31 mEq/L (ref 19–32)
Calcium: 9.8 mg/dL (ref 8.4–10.5)
Chloride: 103 mEq/L (ref 96–112)
Creatinine, Ser: 1.09 mg/dL (ref 0.40–1.20)
GFR: 46.36 mL/min — ABNORMAL LOW (ref >60.00)
Glucose, Bld: 76 mg/dL (ref 70–99)
Potassium: 4.8 mEq/L (ref 3.5–5.1)
Sodium: 138 mEq/L (ref 135–145)
Total Bilirubin: 0.7 mg/dL (ref 0.2–1.2)
Total Protein: 7 g/dL (ref 6.0–8.3)

## 2020-08-30 LAB — HEMOGLOBIN A1C: Hgb A1c MFr Bld: 5.1 % (ref 4.6–6.5)

## 2020-08-30 LAB — LIPID PANEL
Cholesterol: 208 mg/dL — ABNORMAL HIGH (ref 0–200)
HDL: 67.7 mg/dL (ref >39.00)
LDL Cholesterol: 107 mg/dL — ABNORMAL HIGH (ref 0–99)
NonHDL: 140.48
Total CHOL/HDL Ratio: 3
Triglycerides: 166 mg/dL — ABNORMAL HIGH (ref 0.0–149.0)
VLDL: 33.2 mg/dL (ref 0.0–40.0)

## 2020-08-30 LAB — VITAMIN D 25 HYDROXY (VIT D DEFICIENCY, FRACTURES): VITD: 42.68 ng/mL (ref 30.00–100.00)

## 2020-08-30 LAB — TSH: TSH: 3.21 u[IU]/mL (ref 0.35–4.50)

## 2020-08-30 NOTE — Assessment & Plan Note (Signed)
Disc goals for lipids and reasons to control them Rev last labs with pt Rev low sat fat diet in detail Labs today  Taking questran for diarrhea- also may help chol

## 2020-08-30 NOTE — Assessment & Plan Note (Signed)
Ref to PT -has helped in the past

## 2020-08-30 NOTE — Assessment & Plan Note (Signed)
dexa utd  On HRT-given age would like to have her stop Intol of alendronate  No falls/fractures

## 2020-08-30 NOTE — Assessment & Plan Note (Signed)
No symptoms Continue to monitor bp and cholesterol

## 2020-08-30 NOTE — Assessment & Plan Note (Signed)
bp in fair control at this time  BP Readings from Last 1 Encounters:  08/30/20 135/80   No changes needed, still controlled with lifestyle change Most recent labs reviewed  Disc lifstyle change with low sodium diet and exercise

## 2020-08-30 NOTE — Assessment & Plan Note (Signed)
Urge and stress Saw urology in 2017 Allergic to mybertreq at that time May want to f//u with urology in the future

## 2020-08-30 NOTE — Assessment & Plan Note (Signed)
Discussed how this problem influences overall health and the risks it imposes  Reviewed plan for weight loss with lower calorie diet (via better food choices and also portion control or program like weight watchers) and exercise building up to or more than 30 minutes 5 days per week including some aerobic activity    

## 2020-08-30 NOTE — Assessment & Plan Note (Signed)
Disc risks of estrace for age Will come off of it

## 2020-08-30 NOTE — Assessment & Plan Note (Signed)
D level today  Takes 4000 iu D3 daily  Disc imp to bone and overall health

## 2020-08-30 NOTE — Progress Notes (Signed)
Subjective:    Patient ID: Linda Mcgrath, female    DOB: 1932/11/06, 87 y.o.   MRN: [id number]  This visit occurred during the SARS-CoV-2 public health emergency.  Safety protocols were in place, including screening questions prior to the visit, additional usage of staff PPE, and extensive cleaning of exam room while observing appropriate contact time as indicated for disinfecting solutions.    HPI Pt presents for f/u of chronic medical problems   Wt Readings from Last 3 Encounters:  08/30/20 177 lb 5 oz (80.4 kg)  07/05/20 179 lb (81.2 kg)  05/31/20 179 lb 7 oz (81.4 kg)   31.41 kg/m  Likes to get out and walk   Had covid vaccines and booster -is very careful  Had her flu shot    HTN bp is stable today  No cp or palpitations or headaches or edema  No medicines currently- controlling with lifestyle measures  BP Readings from Last 3 Encounters:  08/30/20 135/80  07/05/20 120/78  05/31/20 130/78    Known aortic atherosclerosis on old CT scan-obs  Lab Results  Component Value Date   CREATININE 1.09 05/31/2020   BUN 20 05/31/2020   NA 137 05/31/2020   K 5.0 05/31/2020   CL 103 05/31/2020   CO2 28 05/31/2020   Hypothyroidism  Pt has no clinical changes She feels like her eyeballs look "too big" , occ eyes feel irritated (had a nl eye exam as well but recommended some dry eye drops)  Lab Results  Component Value Date   TSH 2.81 03/23/2019    Due for TSH Levothyroxine 88 mcg   Osteopenia  Takes estrace 1 mg daily  Tried alendronate-intolerant  Last dexa 8/20  H/o low vit D-level 37.2 in 7/20 on suppl 4000 iu D3 daily-still takes    Prediabetes Lab Results  Component Value Date   HGBA1C 5.1 03/23/2019   Due for labs   Hyperlipidemia Lab Results  Component Value Date   CHOL 194 05/31/2020   HDL 64.40 05/31/2020   LDLCALC 107 (H) 05/31/2020   LDLDIRECT 125.7 06/09/2013   TRIG 113.0 05/31/2020   CHOLHDL 3 05/31/2020   Taking questran and  watches diet  [name] is also for chronic diarrhea- is very expensive 185 dollars for 3 mo supply   Diet is pretty good-no big changes  Eats oatmeal Smaller meals esp at night    Takes pepcid for GERD  Chronic pain across shoulders/upper back  Takes tylenol  Some heat  Is interested in PT -has helped in the past   Urinary incontinence is worse  Uses cranberry and azo product  Has seen urology in the past  Did not tolerate mybertriq   Saw Dr Isabel Caprice in the past  Unsure if she wants to go back   Patient Active Problem List   Diagnosis Date Noted  . Chronic upper back pain 08/30/2020  . Aortic atherosclerosis (HCC) 05/31/2020  . Abnormal CT scan, small bowel 05/18/2020  . Generalized abdominal pain 05/17/2020  . Duodenitis 03/13/2020  . Right lateral abdominal pain 03/07/2020  . Insomnia 12/15/2019  . Chronic low back pain 10/18/2019  . Positive depression screening 10/18/2019  . Osteoarthritis of right knee 08/02/2019  . Diarrhea 07/03/2018  . Dyspepsia 07/03/2018  . S/P lumbar spinal fusion 07/31/2017  . Right low back pain 03/04/2017  . H/O atrial flutter 03/06/2016  . Obesity 11/06/2015  . Urge incontinence 01/24/2015  . Estrogen deficiency 10/25/2014  . Lichen planus 12/20/2013  .  Encounter for Medicare annual wellness exam 06/17/2013  . Screening mammogram, encounter for 06/15/2012  . Prediabetes 06/03/2011  . HYPERTENSION, BENIGN ESSENTIAL 10/23/2010  . OSTEOARTHRITIS, HANDS, BILATERAL 01/24/2010  . Hypothyroidism 08/24/2008  . Vitamin D deficiency 06/06/2008  . Hyperlipidemia 06/06/2008  . MENOPAUSAL SYNDROME 03/29/2008  . Osteopenia 03/29/2008  . ALLERGIC RHINITIS 01/25/2008  . IBS 01/25/2008  . OSTEOARTHRITIS 01/25/2008  . FIBROMYALGIA 01/25/2008   Past Medical History:  Diagnosis Date  . Allergic rhinitis   . Arthritis   . Asthma   . Cataract    Bil/lens implant  . Colon polyp 1999   small polyp  . Diverticulosis   . Dysrhythmia   .  Fibromyalgia   . Hyperlipidemia   . Hypothyroidism   . Internal hemorrhoids   . Leg cramps   . Lichen planus   . Lung nodule    stable LUL 9 mm  . Menopausal syndrome   . Osteoarthritis   . UTI (urinary tract infection)    Past Surgical History:  Procedure Laterality Date  . Abd U/S  11/1998   negative  . Abd U/S  01/2001   gallbladder polyps  . ABDOMINAL HYSTERECTOMY  1975   total-fibroids  . APPENDECTOMY  1975  . BREAST BIOPSY     benign  . BREAST EXCISIONAL BIOPSY Left 1957  . CARDIOVERSION N/A 03/07/2016   Procedure: CARDIOVERSION;  Surgeon: Yates Decamp, MD;  Location: [facility] END[facility];  Service: Cardiovascular;  Laterality: N/A;  . CATARACT EXTRACTION     bilateral  . CHOLECYSTECTOMY    . COLONOSCOPY  1998   Diverticulosis; polyp\  . COLONOSCOPY  09/2002   Diverticulosis, hem  . COLONOSCOPY  12/2007   diverticulosis, polyp  . DEXA  10/2001   osteopenia  . ELECTROPHYSIOLOGIC STUDY N/A 04/03/2016   Procedure: A-Flutter Ablation;  Surgeon: Will [name], MD;  Location: [facility];  Service: Cardiovascular;  Laterality: N/A;  . ESOPHAGOGASTRODUODENOSCOPY  2004  . Hida scan  01/2001   Negative  . KNEE SURGERY     right knee / 11/2004  . LAMINECTOMY WITH POSTERIOR LATERAL ARTHRODESIS LEVEL 2 N/A 07/31/2017   Procedure: DECOMPRESSIVE LAMINECTOMY LUMABR FOUR-FIVE, LUMBAR FIVE-SACRAL ONE;  Surgeon: Tia Alert, MD;  Location: [facility] OR;  Service: Neurosurgery;  Laterality: N/A;  . laser surgery for glaucoma Left Sep 21, 2014  . MULTIPLE TOOTH EXTRACTIONS    . ROTATOR CUFF REPAIR     x 2 /right shoulder  . SKIN CANCER EXCISION     pre-melanoma / on face  . TEE WITHOUT CARDIOVERSION N/A 03/07/2016   Procedure: TRANSESOPHAGEAL ECHOCARDIOGRAM (TEE);  Surgeon: Yates Decamp, MD;  Location: [facility];  Service: Cardiovascular;  Laterality: N/A;  . TOE SURGERY     rt foot    Social History   Tobacco Use  . Smoking status: Never Smoker  . Smokeless tobacco: Never Used   Vaping Use  . Vaping Use: Never used  Substance Use Topics  . Alcohol use: No    Alcohol/week: 0.0 standard drinks    Comment: occasional-rare  . Drug use: No   Family History  Problem Relation Age of Onset  . Parkinsonism Father   . Colon cancer Brother   . Allergies Mother   . Heart disease Mother   . Kidney failure Son        congenital  (kidney transplant)   . Heart failure Son        died suddenly of CHF  Allergies  Allergen Reactions  . Shellfish Allergy Anaphylaxis and Nausea And Vomiting  . Tetracycline Hives, Nausea And Vomiting, Rash and Other (See Comments)    SYSTEMIC REACTION: heart palpitations,muscle spasms, vomiting  . Amlodipine Besylate Other (See Comments)    REACTION: muscle spasms, extremity swelling, low bp  . Ciprofloxacin Other (See Comments)    REACTION: muscle spasms, insomnia  . Codeine Other (See Comments)    REACTION: hallucinations  . Fosamax [Alendronate Sodium]   . Propoxyphene Hcl Other (See Comments)    Unknown  . Sulfamethoxazole-Trimethoprim Nausea Only and Other (See Comments)    REACTION: dizzy, could not focus eyes and could not concentrate and nausea  . Tape Other (See Comments)    BANDAIDS TAKE OFF HER SKIN; PLEASE USE COBAN OR PAPER   . Xarelto [Rivaroxaban] Other (See Comments)    Aches and pains and nervousness  . Amoxicillin Rash  . 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 Penicillins Swelling, Rash and Other (See Comments)    Has patient had a PCN reaction causing immediate rash, facial/tongue/throat swelling, SOB or lightheadedness with hypotension: Yes Has patient had a PCN reaction causing severe rash involving mucus membranes or skin necrosis: No Has patient had a PCN reaction that required hospitalization No Has patient had a PCN reaction occurring  within the last 10 years: No If all of the above answers are "NO", then may proceed with Cephalosporin use.    Current Outpatient Medications on File Prior to Visit  Medication Sig Dispense Refill  . Ascorbic Acid (VITAMIN C PO) Take 500 mg by mouth daily.    . B Complex Vitamins (VITAMIN B COMPLEX PO) Take 1 tablet by mouth daily.    . B COMPLEX, FOLIC ACID, PO Take 666 mcg by mouth daily.    . Biotin 10 MG TABS Take 1 tablet by mouth every morning.     . Calcium Citrate-Vitamin D 250-200 MG-UNIT TABS Take 1 tablet by mouth daily.    . cholestyramine (QUESTRAN) 4 g packet Take 1 packet (4 g total) by mouth daily. 60 each 5  . Cranberry 180 MG CAPS Take 2 capsules by mouth daily.    . famotidine (PEPCID) 20 MG tablet Take 1 tablet (20 mg total) by mouth 2 (two) times daily. 60 tablet 3  . Fexofenadine HCl (MUCINEX ALLERGY PO) Take 1 tablet by mouth daily as needed (Allergies).     . folic acid (FOLVITE) 1 MG tablet Take 1 mg by mouth daily.    Marland Kitchen levothyroxine (SYNTHROID) 88 MCG tablet TAKE 1 TABLET (88 MCG TOTAL) BY MOUTH DAILY WITH BREAKFAST. 90 tablet 0  . loratadine (CLARITIN) 10 MG tablet Take 10 mg by mouth daily as needed for allergies.     Marland Kitchen MAGNESIUM-OXIDE PO Take 1 tablet by mouth daily.    . Methenamine-Sodium Salicylate (AZO URINARY TRACT DEFENSE PO) Take 1 capsule by mouth daily.    . methocarbamol (ROBAXIN) 500 MG tablet TAKE 1 TABLET (500 MG TOTAL) BY MOUTH EVERY 8 (EIGHT) HOURS AS NEEDED FOR MUSCLE SPASMS. CAUTION OF SEDATION 30 tablet 1  . Multiple Vitamin (MULTIVITAMIN) capsule Take 1 capsule by mouth daily.    . Psyllium (METAMUCIL FIBER PO) Take by mouth at bedtime. 2 tablespoon    . Pumpkin Seed-Soy Germ (AZO BLADDER CONTROL/GO-LESS PO)  Take 1 tablet by mouth 2 (two) times a week.    . TURMERIC PO Take 1 capsule by mouth 2 (two) times daily.     . vitamin B-12 (CYANOCOBALAMIN) 500 MCG tablet Take 500 mcg by mouth daily.    . Vitamin D, Cholecalciferol, 50 MCG (2000 UT)  CAPS Take 2 tablets by mouth daily.     No current facility-administered medications on file prior to visit.     Review of Systems  Constitutional: Negative for activity change, appetite change, fatigue, fever and unexpected weight change.  HENT: Negative for congestion, ear pain, rhinorrhea, sinus pressure and sore throat.   Eyes: Positive for itching. Negative for pain, redness and visual disturbance.  Respiratory: Negative for cough, shortness of breath and wheezing.   Cardiovascular: Negative for chest pain and palpitations.  Gastrointestinal: Negative for abdominal pain, blood in stool, constipation and diarrhea.  Endocrine: Negative for polydipsia and polyuria.  Genitourinary: Negative for dysuria, frequency and urgency.  Musculoskeletal: Positive for arthralgias and back pain. Negative for myalgias.  Skin: Negative for pallor and rash.  Allergic/Immunologic: Negative for environmental allergies.  Neurological: Negative for dizziness, syncope and headaches.  Hematological: Negative for adenopathy. Does not bruise/bleed easily.  Psychiatric/Behavioral: Negative for decreased concentration and dysphoric mood. The patient is not nervous/anxious.        Objective:   Physical Exam Constitutional:      General: She is not in acute distress.    Appearance: Normal appearance. She is well-developed and well-nourished. She is obese. She is not ill-appearing or diaphoretic.  HENT:     Head: Normocephalic and atraumatic.     Mouth/Throat:     Mouth: Oropharynx is clear and moist.  Eyes:     Extraocular Movements: EOM normal.     Conjunctiva/sclera: Conjunctivae normal.     Pupils: Pupils are equal, round, and reactive to light.  Neck:     Thyroid: No thyromegaly.     Vascular: No carotid bruit or JVD.  Cardiovascular:     Rate and Rhythm: Normal rate and regular rhythm.     Pulses: Intact distal pulses.     Heart sounds: Normal heart sounds. No gallop.   Pulmonary:     Effort:  Pulmonary effort is normal. No respiratory distress.     Breath sounds: Normal breath sounds. No wheezing or rales.     Comments: No crackles Abdominal:     General: Bowel sounds are normal. There is no distension or abdominal bruit.     Palpations: Abdomen is soft. There is no mass.     Tenderness: There is no abdominal tenderness.  Musculoskeletal:        General: No tenderness or edema. Normal range of motion.     Cervical back: Normal range of motion and neck supple.     Right lower leg: No edema.     Left lower leg: No edema.     Comments: Mild kyphosis    Lymphadenopathy:     Cervical: No cervical adenopathy.  Skin:    General: Skin is warm and dry.     Coloration: Skin is not pale.     Findings: No erythema or rash.  Neurological:     Mental Status: She is alert.     Sensory: No sensory deficit.     Coordination: Coordination normal.     Deep Tendon Reflexes: Reflexes are normal and symmetric.  Psychiatric:        Mood and Affect: Mood and affect and  mood normal.           Assessment & Plan:   Problem List Items Addressed This Visit      Cardiovascular and Mediastinum   HYPERTENSION, BENIGN ESSENTIAL    bp in fair control at this time  BP Readings from Last 1 Encounters:  08/30/20 135/80   No changes needed, still controlled with lifestyle change Most recent labs reviewed  Disc lifstyle change with low sodium diet and exercise        Relevant Orders   CBC with Differential/Platelet   Comprehensive metabolic panel   Lipid panel   TSH   Aortic atherosclerosis (HCC)    No symptoms Continue to monitor bp and cholesterol        Endocrine   Hypothyroidism - Primary    TSH today  Pt notes eyes look and feel different  Will adj levothyroxine if needed       Relevant Orders   TSH     Musculoskeletal and Integument   Osteopenia    dexa utd  On HRT-given age would like to have her stop Intol of alendronate  No falls/fractures         Other    Vitamin D deficiency    D level today  Takes 4000 iu D3 daily  Disc imp to bone and overall health      Relevant Orders   VITAMIN D 25 Hydroxy (Vit-D Deficiency, Fractures)   Hyperlipidemia    Disc goals for lipids and reasons to control them Rev last labs with pt Rev low sat fat diet in detail Labs today  Taking questran for diarrhea- also may help chol      Relevant Orders   Lipid panel   MENOPAUSAL SYNDROME    Disc risks of estrace for age Will come off of it       Prediabetes    A1C today Mindful about diet  Obese, stable wt      Relevant Orders   Hemoglobin A1c   Urge incontinence    Urge and stress Saw urology in 2017 Allergic to mybertreq at that time May want to f//u with urology in the future      Obesity    Discussed how this problem influences overall health and the risks it imposes  Reviewed plan for weight loss with lower calorie diet (via better food choices and also portion control or program like weight watchers) and exercise building up to or more than 30 minutes 5 days per week including some aerobic activity         Chronic upper back pain    Ref to PT -has helped in the past      Relevant Orders   Ambulatory referral to Physical Therapy

## 2020-08-30 NOTE — Assessment & Plan Note (Signed)
TSH today  Pt notes eyes look and feel different  Will adj levothyroxine if needed

## 2020-08-30 NOTE — Patient Instructions (Addendum)
I placed a physical therapy referral -the office will call you   Labs today   I would like you to get off estrogen at your age  Take it every other day for a week or two before you stop  Keep [place] posted

## 2020-08-30 NOTE — Assessment & Plan Note (Signed)
A1C today Mindful about diet  Obese, stable wt

## 2020-08-31 ENCOUNTER — Other Ambulatory Visit: Payer: Self-pay | Admitting: *Deleted

## 2020-08-31 MED ORDER — LEVOTHYROXINE SODIUM 88 MCG PO TABS: 88.0000 ug | ORAL_TABLET | Freq: Every day | ORAL | 3 refills | Status: DC

## 2020-09-07 ENCOUNTER — Other Ambulatory Visit: Payer: Self-pay

## 2020-09-07 ENCOUNTER — Encounter: Payer: Self-pay | Admitting: Physical Therapy

## 2020-09-07 ENCOUNTER — Ambulatory Visit: Payer: Medicare Other | Attending: Family Medicine | Admitting: Physical Therapy

## 2020-09-07 DIAGNOSIS — M542 Cervicalgia: Secondary | ICD-10-CM | POA: Diagnosis not present

## 2020-09-07 DIAGNOSIS — R262 Difficulty in walking, not elsewhere classified: Secondary | ICD-10-CM | POA: Diagnosis not present

## 2020-09-07 DIAGNOSIS — M62830 Muscle spasm of back: Secondary | ICD-10-CM | POA: Diagnosis not present

## 2020-09-07 DIAGNOSIS — M545 Low back pain, unspecified: Secondary | ICD-10-CM | POA: Insufficient documentation

## 2020-09-07 NOTE — Therapy (Signed)
[facility] [address] [place], [place], [address] Phone: [phone]   Fax:  [phone]  Physical Therapy Evaluation  Patient Details  Name: Linda Mcgrath MRN: [id number] Date of Birth: 08-12-1933 Referring Provider (PT): [name]   Encounter Date: 09/07/2020   PT End of Session - 09/07/20 0918    Visit Number 1    Date for PT Re-Evaluation 11/05/20    PT Start Time 0832    PT Stop Time 0925    PT Time Calculation (min) 53 min    Activity Tolerance Patient tolerated treatment well    Behavior During Therapy [facility] for tasks assessed/performed           Past Medical History:  Diagnosis Date  . Allergic rhinitis   . Arthritis   . Asthma   . Cataract    Bil/lens implant  . Colon polyp 1999   small polyp  . Diverticulosis   . Dysrhythmia   . Fibromyalgia   . Hyperlipidemia   . Hypothyroidism   . Internal hemorrhoids   . Leg cramps   . Lichen planus   . Lung nodule    stable LUL 9 mm  . Menopausal syndrome   . Osteoarthritis   . UTI (urinary tract infection)     Past Surgical History:  Procedure Laterality Date  . Abd U/S  11/1998   negative  . Abd U/S  01/2001   gallbladder polyps  . ABDOMINAL HYSTERECTOMY  1975   total-fibroids  . APPENDECTOMY  1975  . BREAST BIOPSY     benign  . BREAST EXCISIONAL BIOPSY Left 1957  . CARDIOVERSION N/A 03/07/2016   Procedure: CARDIOVERSION;  Surgeon: Yates Decamp, MD;  Location: [facility] ENDOSCOPY;  Service: Cardiovascular;  Laterality: N/A;  . CATARACT EXTRACTION     bilateral  . CHOLECYSTECTOMY    . COLONOSCOPY  1998   Diverticulosis; polyp\  . COLONOSCOPY  09/2002   Diverticulosis, hem  . COLONOSCOPY  12/2007   diverticulosis, polyp  . DEXA  10/2001   osteopenia  . ELECTROPHYSIOLOGIC STUDY N/A 04/03/2016   Procedure: A-Flutter Ablation;  Surgeon: Will [name], MD;  Location: [facility] INVASIVE CV LAB;  Service: Cardiovascular;  Laterality: N/A;  .  ESOPHAGOGASTRODUODENOSCOPY  2004  . Hida scan  01/2001   Negative  . KNEE SURGERY     right knee / 11/2004  . LAMINECTOMY WITH POSTERIOR LATERAL ARTHRODESIS LEVEL 2 N/A 07/31/2017   Procedure: DECOMPRESSIVE LAMINECTOMY LUMABR FOUR-FIVE, LUMBAR FIVE-SACRAL ONE;  Surgeon: Tia Alert, MD;  Location: [facility] OR;  Service: Neurosurgery;  Laterality: N/A;  . laser surgery for glaucoma Left Sep 21, 2014  . MULTIPLE TOOTH EXTRACTIONS    . ROTATOR CUFF REPAIR     x 2 /right shoulder  . SKIN CANCER EXCISION     pre-melanoma / on face  . TEE WITHOUT CARDIOVERSION N/A 03/07/2016   Procedure: TRANSESOPHAGEAL ECHOCARDIOGRAM (TEE);  Surgeon: Yates Decamp, MD;  Location: [facility] ENDOSCOPY;  Service: Cardiovascular;  Laterality: N/A;  . TOE SURGERY     rt foot     There were no vitals filed for this visit.    Subjective Assessment - 09/07/20 0838    Subjective Patient familiar to me as she has had multiple issues with walking, balance, weakness and back pain.  She reports some significant stomach issues over the past year.  She reports that over the past few months she has had mid and low back pain that seems to  be increasing.  She also reports that she feels weaker and having more difficulty walking, reports some difficulty with pain with the decrease in activity    Pertinent History past lumbar fusion    Limitations Lifting;Standing;Walking;House hold activities    How long can you walk comfortably? difficulty shopping and fearful of walking in the yard    Patient Stated Goals have less pain, move better, feel stronger, and more steady, be able to pick stuff up from the floor    Currently in Pain? Yes    Pain Score 6     Pain Location Back    Pain Orientation Mid;Lower;Upper    Pain Descriptors / Indicators Spasm;Tightness;Aching    Pain Type Acute pain    Pain Radiating Towards some pain into the buttock at times    Pain Onset More than a month ago    Pain Frequency Constant    Aggravating Factors   bending and lifting, walking, pain up to 8/10    Pain Relieving Factors tylenol, heat and rest pain can get down to a 4/10    Effect of Pain on Daily Activities reports that she is very careful, fearful of falling, difficulty with ADL's              [facility] PT Assessment - 09/07/20 0001      Assessment   Medical Diagnosis back pain and neck pain    Referring Provider (PT) [name]    Onset Date/Surgical Date 08/07/20    Hand Dominance Right    Prior Therapy early last year      Precautions   Precautions None      Balance Screen   Has the patient fallen in the past 6 months No    Has the patient had a decrease in activity level because of a fear of falling?  No    Is the patient reluctant to leave their home because of a fear of falling?  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    Leisure reading      Posture/Postural Control   Posture Comments fwd head, rounded shoulders, decreased lordosis      ROM / Strength   AROM / PROM / Strength AROM;Strength      AROM   Overall AROM Comments some limitation in knee extension, shoulders have limited ROM, cervical ROM is decreased 50% for flexion, 75% for extension and rotation and 100% for side bending, lumbar ROM      Strength   Overall Strength Comments Arms 4-/5, hips 3+/5, knees 4-/5, ankles 4-/5      Palpation   Palpation comment she is tight and tender in the upper traps, cervical, thoracic and lumbar area      Ambulation/Gait   Gait Comments uses a SPC, slow, slight shuffle, limps on the right, with c/o right knee and hip pain      Standardized Balance Assessment   Standardized Balance Assessment Timed Up and Go Test;Berg Balance Test      Berg Balance Test   Sit to Stand Able to stand without  using hands and stabilize independently    Standing Unsupported Able to stand safely 2 minutes    Sitting with Back  Unsupported but Feet Supported on Floor or Stool Able to sit safely and securely 2 minutes    Stand to Sit Sits safely with minimal use of hands    Transfers Able to transfer safely, definite need of hands    Standing Unsupported with Eyes Closed Able to stand 10 seconds with supervision    Standing Unsupported with Feet Together Able to place feet together independently and stand for 1 minute with supervision    From Standing, Reach Forward with Outstretched Arm Can reach forward >12 cm safely (5")    From Standing Position, Pick up Object from Floor Unable to pick up shoe, but reaches 2-5 cm (1-2") from shoe and balances independently    From Standing Position, Turn to Look Behind Over each Shoulder Turn sideways only but maintains balance    Turn 360 Degrees Able to turn 360 degrees safely but slowly    Standing Unsupported, Alternately Place Feet on Step/Stool Able to stand independently and complete 8 steps >20 seconds    Standing Unsupported, One Foot in Front Able to plae foot ahead of the other independently and hold 30 seconds    Standing on One Leg Tries to lift leg/unable to hold 3 seconds but remains standing independently    Total Score 41      Timed Up and Go Test   Normal TUG (seconds) 30    TUG Comments SPC                      Objective measurements completed on examination: See above findings.                 PT Short Term Goals - 09/07/20 0921      PT SHORT TERM GOAL #1   Title independent with HEP    Time 2    Period Weeks    Status New             PT Long Term Goals - 09/07/20 1610      PT LONG TERM GOAL #1   Title decrease pain 50%    Time 8    Period Weeks    Status New      PT LONG TERM GOAL #2   Title increase lumbar ROM 25%    Time 8    Period Weeks    Status New      PT LONG TERM GOAL #3   Title  Understand proper posture and body mechanics for housework and yardwork tasks.     Time 8    Period Weeks    Status New      PT LONG TERM GOAL #4   Title tolerate shopping for groceries without an increase in pain    Time 8    Period Weeks    Status New      PT LONG TERM GOAL #5   Title decrease TUG time to 17 seconds    Time 8    Period Weeks    Status New      Additional Long Term Goals   Additional Long Term Goals Yes      PT LONG TERM GOAL #6   Title increase Berg balance test score to 47/56    Time 12    Period Weeks    Status New                  Plan - 09/07/20 0919    Clinical Impression Statement Patient has a  history of lumbar fusion surgery about 2-3 years ago.  She reports that with Covid and with a digestive issue in the past year she has really "gone down hill", reports that she is hurting in the neck, upper back and low back, also is very weak and reports increased difficulty walking and a fear of falling, reports that she cannot pick things up from the floor and has difficulty shopping.    Stability/Clinical Decision Making Stable/Uncomplicated    Clinical Decision Making Low    Rehab Potential Good    PT Frequency 2x / week    PT Duration 8 weeks    PT Treatment/Interventions ADLs/Self Care Home Management;Electrical Stimulation;Moist Heat;Ultrasound;Gait training;Stair training;Functional mobility training;Therapeutic activities;Therapeutic exercise;Balance training;Neuromuscular re-education;Manual techniques;Patient/family education    PT Next Visit Plan work on function, strength. balance and pain    Consulted and Agree with Plan of Care Patient           Patient will benefit from skilled therapeutic intervention in order to improve the following deficits and impairments:  Abnormal gait,Decreased range of motion,Difficulty walking,Increased muscle spasms,Decreased activity tolerance,Pain,Decreased balance,Impaired flexibility,Improper body  mechanics,Postural dysfunction,Decreased strength,Decreased mobility  Visit Diagnosis: Acute bilateral low back pain without sciatica - Plan: PT plan of care cert/re-cert  Muscle spasm of back - Plan: PT plan of care cert/re-cert  Difficulty in walking, not elsewhere classified - Plan: PT plan of care cert/re-cert  Cervicalgia - Plan: PT plan of care cert/re-cert     Problem List Patient Active Problem List   Diagnosis Date Noted  . Chronic upper back pain 08/30/2020  . Aortic atherosclerosis ([facility]) 05/31/2020  . Abnormal CT scan, small bowel 05/18/2020  . Generalized abdominal pain 05/17/2020  . Duodenitis 03/13/2020  . Right lateral abdominal pain 03/07/2020  . Insomnia 12/15/2019  . Chronic low back pain 10/18/2019  . Positive depression screening 10/18/2019  . Osteoarthritis of right knee 08/02/2019  . Diarrhea 07/03/2018  . Dyspepsia 07/03/2018  . S/P lumbar spinal fusion 07/31/2017  . Right low back pain 03/04/2017  . H/O atrial flutter 03/06/2016  . Obesity 11/06/2015  . Urge incontinence 01/24/2015  . Estrogen deficiency 10/25/2014  . Lichen planus 12/20/2013  . Encounter for Medicare annual wellness exam 06/17/2013  . Screening mammogram, encounter for 06/15/2012  . Prediabetes 06/03/2011  . HYPERTENSION, BENIGN ESSENTIAL 10/23/2010  . OSTEOARTHRITIS, HANDS, BILATERAL 01/24/2010  . Hypothyroidism 08/24/2008  . Vitamin D deficiency 06/06/2008  . Hyperlipidemia 06/06/2008  . MENOPAUSAL SYNDROME 03/29/2008  . Osteopenia 03/29/2008  . ALLERGIC RHINITIS 01/25/2008  . IBS 01/25/2008  . OSTEOARTHRITIS 01/25/2008  . FIBROMYALGIA 01/25/2008    Jearld Lesch., PT 09/07/2020, 9:24 AM  [facility] [address] [place], [place], [address] Phone: [phone]   Fax:  [phone]  Name: Linda Mcgrath MRN: [id number] Date of Birth: 06-20-1933

## 2020-09-11 ENCOUNTER — Ambulatory Visit: Payer: Medicare Other | Admitting: Physical Therapy

## 2020-09-12 ENCOUNTER — Ambulatory Visit: Payer: Medicare Other | Admitting: Physical Therapy

## 2020-09-13 ENCOUNTER — Other Ambulatory Visit: Payer: Self-pay

## 2020-09-13 ENCOUNTER — Ambulatory Visit: Payer: Medicare Other | Admitting: Physical Therapy

## 2020-09-13 ENCOUNTER — Encounter: Payer: Self-pay | Admitting: Physical Therapy

## 2020-09-13 DIAGNOSIS — M542 Cervicalgia: Secondary | ICD-10-CM | POA: Diagnosis not present

## 2020-09-13 DIAGNOSIS — R262 Difficulty in walking, not elsewhere classified: Secondary | ICD-10-CM

## 2020-09-13 DIAGNOSIS — M62830 Muscle spasm of back: Secondary | ICD-10-CM

## 2020-09-13 DIAGNOSIS — M545 Low back pain, unspecified: Secondary | ICD-10-CM | POA: Diagnosis not present

## 2020-09-13 NOTE — Therapy (Signed)
[place]. [place], [place], [address] Phone: [phone]   Fax:  [phone]  Physical Therapy Treatment  Patient Details  Name: Linda Mcgrath MRN: [id number] Date of Birth: 06-Sep-1932 Referring Provider (PT): [name]   Encounter Date: 09/13/2020   PT End of Session - 09/13/20 1453    Visit Number 2    Date for PT Re-Evaluation 11/05/20    PT Start Time 1414    PT Stop Time 1510    PT Time Calculation (min) 56 min    Activity Tolerance Patient tolerated treatment well    Behavior During Therapy [facility] for tasks assessed/performed           Past Medical History:  Diagnosis Date  . Allergic rhinitis   . Arthritis   . Asthma   . Cataract    Bil/lens implant  . Colon polyp 1999   small polyp  . Diverticulosis   . Dysrhythmia   . Fibromyalgia   . Hyperlipidemia   . Hypothyroidism   . Internal hemorrhoids   . Leg cramps   . Lichen planus   . Lung nodule    stable LUL 9 mm  . Menopausal syndrome   . Osteoarthritis   . UTI (urinary tract infection)     Past Surgical History:  Procedure Laterality Date  . Abd U/S  11/1998   negative  . Abd U/S  01/2001   gallbladder polyps  . ABDOMINAL HYSTERECTOMY  1975   total-fibroids  . APPENDECTOMY  1975  . BREAST BIOPSY     benign  . BREAST EXCISIONAL BIOPSY Left 1957  . CARDIOVERSION N/A 03/07/2016   Procedure: CARDIOVERSION;  Surgeon: Adrian Prows, MD;  Location: [place];  Service: Cardiovascular;  Laterality: N/A;  . CATARACT EXTRACTION     bilateral  . CHOLECYSTECTOMY    . COLONOSCOPY  1998   Diverticulosis; polyp\  . COLONOSCOPY  09/2002   Diverticulosis, hem  . COLONOSCOPY  12/2007   diverticulosis, polyp  . DEXA  10/2001   osteopenia  . ELECTROPHYSIOLOGIC STUDY N/A 04/03/2016   Procedure: A-Flutter Ablation;  Surgeon: Will Meredith Leeds, MD;  Location: [place] CV LAB;  Service: Cardiovascular;  Laterality: N/A;  .  ESOPHAGOGASTRODUODENOSCOPY  2004  . Hida scan  01/2001   Negative  . KNEE SURGERY     right knee / 11/2004  . LAMINECTOMY WITH POSTERIOR LATERAL ARTHRODESIS LEVEL 2 N/A 07/31/2017   Procedure: DECOMPRESSIVE LAMINECTOMY LUMABR FOUR-FIVE, LUMBAR FIVE-SACRAL ONE;  Surgeon: Eustace Moore, MD;  Location: [place];  Service: Neurosurgery;  Laterality: N/A;  . laser surgery for glaucoma Left Sep 21, 2014  . MULTIPLE TOOTH EXTRACTIONS    . ROTATOR CUFF REPAIR     x 2 /right shoulder  . SKIN CANCER EXCISION     pre-melanoma / on face  . TEE WITHOUT CARDIOVERSION N/A 03/07/2016   Procedure: TRANSESOPHAGEAL ECHOCARDIOGRAM (TEE);  Surgeon: Adrian Prows, MD;  Location: [place];  Service: Cardiovascular;  Laterality: N/A;  . TOE SURGERY     rt foot     There were no vitals filed for this visit.   Subjective Assessment - 09/13/20 1418    Subjective Pateint reports that she has been inside for a few days due to the ice and snow, I try to be so careful    Currently in Pain? Yes    Pain Score 6     Pain Location Back  Patient will benefit from skilled therapeutic intervention in order to improve the following deficits and impairments:  Abnormal gait,Decreased range of motion,Difficulty walking,Increased muscle spasms,Decreased activity tolerance,Pain,Decreased balance,Impaired flexibility,Improper  body mechanics,Postural dysfunction,Decreased strength,Decreased mobility  Visit Diagnosis: Acute bilateral low back pain without sciatica  Muscle spasm of back  Difficulty in walking, not elsewhere classified  Cervicalgia     Problem List Patient Active Problem List   Diagnosis Date Noted  . Chronic upper back pain 08/30/2020  . Aortic atherosclerosis ([place]) 05/31/2020  . Abnormal CT scan, small bowel 05/18/2020  . Generalized abdominal pain 05/17/2020  . Duodenitis 03/13/2020  . Right lateral abdominal pain 03/07/2020  . Insomnia 12/15/2019  . Chronic low back pain 10/18/2019  . Positive depression screening 10/18/2019  . Osteoarthritis of right knee 08/02/2019  . Diarrhea 07/03/2018  . Dyspepsia 07/03/2018  . S/P lumbar spinal fusion 07/31/2017  . Right low back pain 03/04/2017  . H/O atrial flutter 03/06/2016  . Obesity 11/06/2015  . Urge incontinence 01/24/2015  . Estrogen deficiency 10/25/2014  . Lichen planus 51/70/0174  . Encounter for Medicare annual wellness exam 06/17/2013  . Screening mammogram, encounter for 06/15/2012  . Prediabetes 06/03/2011  . HYPERTENSION, BENIGN ESSENTIAL 10/23/2010  . OSTEOARTHRITIS, HANDS, BILATERAL 01/24/2010  . Hypothyroidism 08/24/2008  . Vitamin D deficiency 06/06/2008  . Hyperlipidemia 06/06/2008  . MENOPAUSAL SYNDROME 03/29/2008  . Osteopenia 03/29/2008  . ALLERGIC RHINITIS 01/25/2008  . IBS 01/25/2008  . OSTEOARTHRITIS 01/25/2008  . FIBROMYALGIA 01/25/2008    [name], PT 09/13/2020, 2:56 PM  [place]. [place], [place], [address] Phone: [phone]   Fax:  [phone]  Name: Linda Mcgrath MRN: [id number] Date of Birth: 08/24/1933  Patient will benefit from skilled therapeutic intervention in order to improve the following deficits and impairments:  Abnormal gait,Decreased range of motion,Difficulty walking,Increased muscle spasms,Decreased activity tolerance,Pain,Decreased balance,Impaired flexibility,Improper  body mechanics,Postural dysfunction,Decreased strength,Decreased mobility  Visit Diagnosis: Acute bilateral low back pain without sciatica  Muscle spasm of back  Difficulty in walking, not elsewhere classified  Cervicalgia     Problem List Patient Active Problem List   Diagnosis Date Noted  . Chronic upper back pain 08/30/2020  . Aortic atherosclerosis ([place]) 05/31/2020  . Abnormal CT scan, small bowel 05/18/2020  . Generalized abdominal pain 05/17/2020  . Duodenitis 03/13/2020  . Right lateral abdominal pain 03/07/2020  . Insomnia 12/15/2019  . Chronic low back pain 10/18/2019  . Positive depression screening 10/18/2019  . Osteoarthritis of right knee 08/02/2019  . Diarrhea 07/03/2018  . Dyspepsia 07/03/2018  . S/P lumbar spinal fusion 07/31/2017  . Right low back pain 03/04/2017  . H/O atrial flutter 03/06/2016  . Obesity 11/06/2015  . Urge incontinence 01/24/2015  . Estrogen deficiency 10/25/2014  . Lichen planus 51/70/0174  . Encounter for Medicare annual wellness exam 06/17/2013  . Screening mammogram, encounter for 06/15/2012  . Prediabetes 06/03/2011  . HYPERTENSION, BENIGN ESSENTIAL 10/23/2010  . OSTEOARTHRITIS, HANDS, BILATERAL 01/24/2010  . Hypothyroidism 08/24/2008  . Vitamin D deficiency 06/06/2008  . Hyperlipidemia 06/06/2008  . MENOPAUSAL SYNDROME 03/29/2008  . Osteopenia 03/29/2008  . ALLERGIC RHINITIS 01/25/2008  . IBS 01/25/2008  . OSTEOARTHRITIS 01/25/2008  . FIBROMYALGIA 01/25/2008    [name], PT 09/13/2020, 2:56 PM  [place]. [place], [place], [address] Phone: [phone]   Fax:  [phone]  Name: Linda Mcgrath MRN: [id number] Date of Birth: 08/24/1933

## 2020-09-18 ENCOUNTER — Other Ambulatory Visit: Payer: Self-pay

## 2020-09-18 ENCOUNTER — Ambulatory Visit: Payer: Medicare Other | Admitting: Physical Therapy

## 2020-09-18 ENCOUNTER — Encounter: Payer: Self-pay | Admitting: Physical Therapy

## 2020-09-18 DIAGNOSIS — R262 Difficulty in walking, not elsewhere classified: Secondary | ICD-10-CM | POA: Diagnosis not present

## 2020-09-18 DIAGNOSIS — M545 Low back pain, unspecified: Secondary | ICD-10-CM | POA: Diagnosis not present

## 2020-09-18 DIAGNOSIS — M62830 Muscle spasm of back: Secondary | ICD-10-CM | POA: Diagnosis not present

## 2020-09-18 DIAGNOSIS — M542 Cervicalgia: Secondary | ICD-10-CM | POA: Diagnosis not present

## 2020-09-18 NOTE — Therapy (Signed)
balance and pain    Consulted and Agree with Plan of Care Patient           Patient will benefit from skilled therapeutic intervention in order to improve the following deficits and impairments:  Abnormal gait,Decreased range of motion,Difficulty walking,Increased muscle spasms,Decreased activity tolerance,Pain,Decreased balance,Impaired flexibility,Improper body  mechanics,Postural dysfunction,Decreased strength,Decreased mobility  Visit Diagnosis: Acute bilateral low back pain without sciatica  Muscle spasm of back  Difficulty in walking, not elsewhere classified  Cervicalgia     Problem List Patient Active Problem List   Diagnosis Date Noted  . Chronic upper back pain 08/30/2020  . Aortic atherosclerosis ([place]) 05/31/2020  . Abnormal CT scan, small bowel 05/18/2020  . Generalized abdominal pain 05/17/2020  . Duodenitis 03/13/2020  . Right lateral abdominal pain 03/07/2020  . Insomnia 12/15/2019  . Chronic low back pain 10/18/2019  . Positive depression screening 10/18/2019  . Osteoarthritis of right knee 08/02/2019  . Diarrhea 07/03/2018  . Dyspepsia 07/03/2018  . S/P lumbar spinal fusion 07/31/2017  . Right low back pain 03/04/2017  . H/O atrial flutter 03/06/2016  . Obesity 11/06/2015  . Urge incontinence 01/24/2015  . Estrogen deficiency 10/25/2014  . Lichen planus 21/30/8657  . Encounter for Medicare annual wellness exam 06/17/2013  . Screening mammogram, encounter for 06/15/2012  . Prediabetes 06/03/2011  . HYPERTENSION, BENIGN ESSENTIAL 10/23/2010  . OSTEOARTHRITIS, HANDS, BILATERAL 01/24/2010  . Hypothyroidism 08/24/2008  . Vitamin D deficiency 06/06/2008  . Hyperlipidemia 06/06/2008  . MENOPAUSAL SYNDROME 03/29/2008  . Osteopenia 03/29/2008  . ALLERGIC RHINITIS 01/25/2008  . IBS 01/25/2008  . OSTEOARTHRITIS 01/25/2008  . FIBROMYALGIA 01/25/2008    [name] W.,PT 09/18/2020, 4:12 PM  [place] [place]. [place], [place], [address] Phone: [phone]   Fax:  [phone]  Name: Linda Mcgrath MRN: [id number] Date of Birth: Feb 07, 1933  [place]. [place], [place], [address] Phone: [phone]   Fax:  [phone]  Physical Therapy Treatment  Patient Details  Name: Linda Mcgrath MRN: [id number] Date of Birth: [birth year] Referring Provider (PT): [name]   Encounter Date: 09/18/2020   PT End of Session - 09/18/20 1606    Visit Number 3    Date for PT Re-Evaluation 11/05/20    PT Start Time [id number]    PT Stop Time 1610    PT Time Calculation (min) 46 min    Activity Tolerance Patient tolerated treatment well    Behavior During Therapy [facility] for tasks assessed/performed           Past Medical History:  Diagnosis Date  . Allergic rhinitis   . Arthritis   . Asthma   . Cataract    Bil/lens implant  . Colon polyp 1999   small polyp  . Diverticulosis   . Dysrhythmia   . Fibromyalgia   . Hyperlipidemia   . Hypothyroidism   . Internal hemorrhoids   . Leg cramps   . Lichen planus   . Lung nodule    stable LUL 9 mm  . Menopausal syndrome   . Osteoarthritis   . UTI (urinary tract infection)     Past Surgical History:  Procedure Laterality Date  . Abd U/S  11/1998   negative  . Abd U/S  01/2001   gallbladder polyps  . ABDOMINAL HYSTERECTOMY  1975   total-fibroids  . APPENDECTOMY  1975  . BREAST BIOPSY     benign  . BREAST EXCISIONAL BIOPSY Left 1957  . CARDIOVERSION N/A 03/07/2016   Procedure: CARDIOVERSION;  Surgeon: Adrian Prows, MD;  Location: [facility];  Service: Cardiovascular;  Laterality: N/A;  . CATARACT EXTRACTION     bilateral  . CHOLECYSTECTOMY    . COLONOSCOPY  1998   Diverticulosis; polyp\  . COLONOSCOPY  09/2002   Diverticulosis, hem  . COLONOSCOPY  12/2007   diverticulosis, polyp  . DEXA  10/2001   osteopenia  . ELECTROPHYSIOLOGIC STUDY N/A 04/03/2016   Procedure: A-Flutter Ablation;  Surgeon: Will Meredith Leeds, MD;  Location: [facility];  Service: Cardiovascular;  Laterality: N/A;  .  ESOPHAGOGASTRODUODENOSCOPY  2004  . [place] scan  01/2001   Negative  . KNEE SURGERY     right knee / 11/2004  . LAMINECTOMY WITH POSTERIOR LATERAL ARTHRODESIS LEVEL 2 N/A 07/31/2017   Procedure: DECOMPRESSIVE LAMINECTOMY LUMABR FOUR-FIVE, LUMBAR FIVE-SACRAL ONE;  Surgeon: Eustace Moore, MD;  Location: [facility];  Service: Neurosurgery;  Laterality: N/A;  . laser surgery for glaucoma Left Sep 21, 2014  . MULTIPLE TOOTH EXTRACTIONS    . ROTATOR CUFF REPAIR     x 2 /right shoulder  . SKIN CANCER EXCISION     pre-melanoma / on face  . TEE WITHOUT CARDIOVERSION N/A 03/07/2016   Procedure: TRANSESOPHAGEAL ECHOCARDIOGRAM (TEE);  Surgeon: Adrian Prows, MD;  Location: [facility];  Service: Cardiovascular;  Laterality: N/A;  . TOE SURGERY     rt foot     There were no vitals filed for this visit.   Subjective Assessment - 09/18/20 1533    Subjective Patient reports that her back is hurting from being very careful with the ice and snow    Currently in Pain? Yes    Pain Score 7     Pain Location Back    Pain Orientation Mid;Lower    Pain Descriptors / Indicators  balance and pain    Consulted and Agree with Plan of Care Patient           Patient will benefit from skilled therapeutic intervention in order to improve the following deficits and impairments:  Abnormal gait,Decreased range of motion,Difficulty walking,Increased muscle spasms,Decreased activity tolerance,Pain,Decreased balance,Impaired flexibility,Improper body  mechanics,Postural dysfunction,Decreased strength,Decreased mobility  Visit Diagnosis: Acute bilateral low back pain without sciatica  Muscle spasm of back  Difficulty in walking, not elsewhere classified  Cervicalgia     Problem List Patient Active Problem List   Diagnosis Date Noted  . Chronic upper back pain 08/30/2020  . Aortic atherosclerosis ([place]) 05/31/2020  . Abnormal CT scan, small bowel 05/18/2020  . Generalized abdominal pain 05/17/2020  . Duodenitis 03/13/2020  . Right lateral abdominal pain 03/07/2020  . Insomnia 12/15/2019  . Chronic low back pain 10/18/2019  . Positive depression screening 10/18/2019  . Osteoarthritis of right knee 08/02/2019  . Diarrhea 07/03/2018  . Dyspepsia 07/03/2018  . S/P lumbar spinal fusion 07/31/2017  . Right low back pain 03/04/2017  . H/O atrial flutter 03/06/2016  . Obesity 11/06/2015  . Urge incontinence 01/24/2015  . Estrogen deficiency 10/25/2014  . Lichen planus 21/30/8657  . Encounter for Medicare annual wellness exam 06/17/2013  . Screening mammogram, encounter for 06/15/2012  . Prediabetes 06/03/2011  . HYPERTENSION, BENIGN ESSENTIAL 10/23/2010  . OSTEOARTHRITIS, HANDS, BILATERAL 01/24/2010  . Hypothyroidism 08/24/2008  . Vitamin D deficiency 06/06/2008  . Hyperlipidemia 06/06/2008  . MENOPAUSAL SYNDROME 03/29/2008  . Osteopenia 03/29/2008  . ALLERGIC RHINITIS 01/25/2008  . IBS 01/25/2008  . OSTEOARTHRITIS 01/25/2008  . FIBROMYALGIA 01/25/2008    [name] W.,PT 09/18/2020, 4:12 PM  [facility] [place] [place]. [place], [place], [address] Phone: [phone]   Fax:  [phone]  Name: Linda Mcgrath MRN: [id number] Date of Birth: Feb 07, 1933

## 2020-09-21 ENCOUNTER — Other Ambulatory Visit: Payer: Self-pay

## 2020-09-21 ENCOUNTER — Ambulatory Visit: Payer: Medicare Other | Admitting: Physical Therapy

## 2020-09-21 ENCOUNTER — Encounter: Payer: Self-pay | Admitting: Physical Therapy

## 2020-09-21 DIAGNOSIS — M542 Cervicalgia: Secondary | ICD-10-CM | POA: Diagnosis not present

## 2020-09-21 DIAGNOSIS — M62830 Muscle spasm of back: Secondary | ICD-10-CM

## 2020-09-21 DIAGNOSIS — R262 Difficulty in walking, not elsewhere classified: Secondary | ICD-10-CM

## 2020-09-21 DIAGNOSIS — M545 Low back pain, unspecified: Secondary | ICD-10-CM

## 2020-09-21 NOTE — Therapy (Signed)
07/03/2018  . S/P lumbar spinal fusion 07/31/2017  . Right low back pain 03/04/2017  . H/O atrial flutter 03/06/2016  . Obesity 11/06/2015  . Urge incontinence 01/24/2015  . Estrogen deficiency 10/25/2014  . Lichen planus 40/34/7425  . Encounter for Medicare annual wellness exam 06/17/2013  . Screening mammogram, encounter for 06/15/2012  . Prediabetes 06/03/2011  . HYPERTENSION, BENIGN ESSENTIAL 10/23/2010  . OSTEOARTHRITIS, HANDS, BILATERAL 01/24/2010  . Hypothyroidism 08/24/2008  . Vitamin D deficiency 06/06/2008  . Hyperlipidemia 06/06/2008  . MENOPAUSAL SYNDROME 03/29/2008  . Osteopenia 03/29/2008  . ALLERGIC RHINITIS 01/25/2008  . IBS 01/25/2008  . OSTEOARTHRITIS 01/25/2008  . FIBROMYALGIA 01/25/2008    [name], PT 09/21/2020, 5:19 PM  [place]. [place], [place], [address] Phone: [phone]   Fax:  [phone]  Name: Linda Mcgrath MRN: [id number] Date of Birth: 08/16/1933  07/03/2018  . S/P lumbar spinal fusion 07/31/2017  . Right low back pain 03/04/2017  . H/O atrial flutter 03/06/2016  . Obesity 11/06/2015  . Urge incontinence 01/24/2015  . Estrogen deficiency 10/25/2014  . Lichen planus 40/34/7425  . Encounter for Medicare annual wellness exam 06/17/2013  . Screening mammogram, encounter for 06/15/2012  . Prediabetes 06/03/2011  . HYPERTENSION, BENIGN ESSENTIAL 10/23/2010  . OSTEOARTHRITIS, HANDS, BILATERAL 01/24/2010  . Hypothyroidism 08/24/2008  . Vitamin D deficiency 06/06/2008  . Hyperlipidemia 06/06/2008  . MENOPAUSAL SYNDROME 03/29/2008  . Osteopenia 03/29/2008  . ALLERGIC RHINITIS 01/25/2008  . IBS 01/25/2008  . OSTEOARTHRITIS 01/25/2008  . FIBROMYALGIA 01/25/2008    [name], PT 09/21/2020, 5:19 PM  [place]. [place], [place], [address] Phone: [phone]   Fax:  [phone]  Name: Linda Mcgrath MRN: [id number] Date of Birth: 08/16/1933  [place]. [place], [place], [address] Phone: [phone]   Fax:  [phone]  Physical Therapy Treatment  Patient Details  Name: Linda Mcgrath MRN: [id number] Date of Birth: 1932/11/26 Referring Provider (PT): [name]   Encounter Date: 09/21/2020   PT End of Session - 09/21/20 1711    Visit Number 4    Date for PT Re-Evaluation 11/05/20    PT Start Time 1520    PT Stop Time 1612    PT Time Calculation (min) 52 min    Activity Tolerance Patient tolerated treatment well    Behavior During Therapy [facility] for tasks assessed/performed           Past Medical History:  Diagnosis Date  . Allergic rhinitis   . Arthritis   . Asthma   . Cataract    Bil/lens implant  . Colon polyp 1999   small polyp  . Diverticulosis   . Dysrhythmia   . Fibromyalgia   . Hyperlipidemia   . Hypothyroidism   . Internal hemorrhoids   . Leg cramps   . Lichen planus   . Lung nodule    stable LUL 9 mm  . Menopausal syndrome   . Osteoarthritis   . UTI (urinary tract infection)     Past Surgical History:  Procedure Laterality Date  . Abd U/S  11/1998   negative  . Abd U/S  01/2001   gallbladder polyps  . ABDOMINAL HYSTERECTOMY  1975   total-fibroids  . APPENDECTOMY  1975  . BREAST BIOPSY     benign  . BREAST EXCISIONAL BIOPSY Left 1957  . CARDIOVERSION N/A 03/07/2016   Procedure: CARDIOVERSION;  Surgeon: Adrian Prows, MD;  Location: [facility];  Service: Cardiovascular;  Laterality: N/A;  . CATARACT EXTRACTION     bilateral  . CHOLECYSTECTOMY    . COLONOSCOPY  1998   Diverticulosis; polyp\  . COLONOSCOPY  09/2002   Diverticulosis, hem  . COLONOSCOPY  12/2007   diverticulosis, polyp  . DEXA  10/2001   osteopenia  . ELECTROPHYSIOLOGIC STUDY N/A 04/03/2016   Procedure: A-Flutter Ablation;  Surgeon: Will Meredith Leeds, MD;  Location: [facility];  Service: Cardiovascular;  Laterality: N/A;  .  ESOPHAGOGASTRODUODENOSCOPY  2004  . [place] scan  01/2001   Negative  . KNEE SURGERY     right knee / 11/2004  . LAMINECTOMY WITH POSTERIOR LATERAL ARTHRODESIS LEVEL 2 N/A 07/31/2017   Procedure: DECOMPRESSIVE LAMINECTOMY LUMABR FOUR-FIVE, LUMBAR FIVE-SACRAL ONE;  Surgeon: Eustace Moore, MD;  Location: [facility];  Service: Neurosurgery;  Laterality: N/A;  . laser surgery for glaucoma Left Sep 21, 2014  . MULTIPLE TOOTH EXTRACTIONS    . ROTATOR CUFF REPAIR     x 2 /right shoulder  . SKIN CANCER EXCISION     pre-melanoma / on face  . TEE WITHOUT CARDIOVERSION N/A 03/07/2016   Procedure: TRANSESOPHAGEAL ECHOCARDIOGRAM (TEE);  Surgeon: Adrian Prows, MD;  Location: [facility];  Service: Cardiovascular;  Laterality: N/A;  . TOE SURGERY     rt foot     There were no vitals filed for this visit.   Subjective Assessment - 09/21/20 1528    Subjective I did not sleep well last night, had cramps in the legs    Currently in Pain? Yes    Pain Score 7     Pain Location Back    Pain Orientation Mid;Lower    Aggravating Factors  not sleeping

## 2020-09-25 ENCOUNTER — Encounter: Payer: Self-pay | Admitting: Physical Therapy

## 2020-09-25 ENCOUNTER — Ambulatory Visit: Payer: Medicare Other | Admitting: Physical Therapy

## 2020-09-25 ENCOUNTER — Other Ambulatory Visit: Payer: Self-pay

## 2020-09-25 ENCOUNTER — Other Ambulatory Visit: Payer: Self-pay | Admitting: Family Medicine

## 2020-09-25 DIAGNOSIS — M545 Low back pain, unspecified: Secondary | ICD-10-CM | POA: Diagnosis not present

## 2020-09-25 DIAGNOSIS — M62830 Muscle spasm of back: Secondary | ICD-10-CM | POA: Diagnosis not present

## 2020-09-25 DIAGNOSIS — R262 Difficulty in walking, not elsewhere classified: Secondary | ICD-10-CM

## 2020-09-25 DIAGNOSIS — M542 Cervicalgia: Secondary | ICD-10-CM | POA: Diagnosis not present

## 2020-09-25 NOTE — Therapy (Signed)
screening 10/18/2019  . Osteoarthritis of right knee 08/02/2019  . Diarrhea 07/03/2018  . Dyspepsia 07/03/2018  . S/P lumbar spinal fusion 07/31/2017  . Right low back pain 03/04/2017  . H/O atrial flutter 03/06/2016  . Obesity 11/06/2015  . Urge incontinence 01/24/2015  . Estrogen deficiency 10/25/2014  . Lichen planus 06/19/8526  . Encounter for Medicare annual wellness exam 06/17/2013  . Screening mammogram, encounter for 06/15/2012  . Prediabetes 06/03/2011  . HYPERTENSION, BENIGN ESSENTIAL 10/23/2010  . OSTEOARTHRITIS, HANDS, BILATERAL 01/24/2010  . Hypothyroidism 08/24/2008  . Vitamin D deficiency 06/06/2008  . Hyperlipidemia 06/06/2008  . MENOPAUSAL SYNDROME 03/29/2008  . Osteopenia 03/29/2008  . ALLERGIC RHINITIS 01/25/2008  . IBS 01/25/2008  . OSTEOARTHRITIS 01/25/2008  . FIBROMYALGIA 01/25/2008    [name], PT 09/25/2020, 4:10 PM  [place]. [place], [place], [address] Phone: [phone]   Fax:  [phone]  Name: Linda Mcgrath MRN: [id number] Date of Birth: 04/06/1933  [place]. [place], [place], [address] Phone: [phone]   Fax:  [phone]  Physical Therapy Treatment  Patient Details  Name: Linda Mcgrath MRN: [id number] Date of Birth: 1933/01/05 Referring Provider (PT): [name]   Encounter Date: 09/25/2020   PT End of Session - 09/25/20 1607    Visit Number 5    Date for PT Re-Evaluation 11/05/20    PT Start Time 1520    PT Stop Time 1616    PT Time Calculation (min) 56 min    Activity Tolerance Patient tolerated treatment well    Behavior During Therapy [facility] for tasks assessed/performed           Past Medical History:  Diagnosis Date  . Allergic rhinitis   . Arthritis   . Asthma   . Cataract    Bil/lens implant  . Colon polyp 1999   small polyp  . Diverticulosis   . Dysrhythmia   . Fibromyalgia   . Hyperlipidemia   . Hypothyroidism   . Internal hemorrhoids   . Leg cramps   . Lichen planus   . Lung nodule    stable LUL 9 mm  . Menopausal syndrome   . Osteoarthritis   . UTI (urinary tract infection)     Past Surgical History:  Procedure Laterality Date  . Abd U/S  11/1998   negative  . Abd U/S  01/2001   gallbladder polyps  . ABDOMINAL HYSTERECTOMY  1975   total-fibroids  . APPENDECTOMY  1975  . BREAST BIOPSY     benign  . BREAST EXCISIONAL BIOPSY Left 1957  . CARDIOVERSION N/A 03/07/2016   Procedure: CARDIOVERSION;  Surgeon: Adrian Prows, MD;  Location: [facility];  Service: Cardiovascular;  Laterality: N/A;  . CATARACT EXTRACTION     bilateral  . CHOLECYSTECTOMY    . COLONOSCOPY  1998   Diverticulosis; polyp\  . COLONOSCOPY  09/2002   Diverticulosis, hem  . COLONOSCOPY  12/2007   diverticulosis, polyp  . DEXA  10/2001   osteopenia  . ELECTROPHYSIOLOGIC STUDY N/A 04/03/2016   Procedure: A-Flutter Ablation;  Surgeon: Will Meredith Leeds, MD;  Location: [facility];  Service: Cardiovascular;  Laterality: N/A;  .  ESOPHAGOGASTRODUODENOSCOPY  2004  . [place] scan  01/2001   Negative  . KNEE SURGERY     right knee / 11/2004  . LAMINECTOMY WITH POSTERIOR LATERAL ARTHRODESIS LEVEL 2 N/A 07/31/2017   Procedure: DECOMPRESSIVE LAMINECTOMY LUMABR FOUR-FIVE, LUMBAR FIVE-SACRAL ONE;  Surgeon: Eustace Moore, MD;  Location: [facility];  Service: Neurosurgery;  Laterality: N/A;  . laser surgery for glaucoma Left Sep 21, 2014  . MULTIPLE TOOTH EXTRACTIONS    . ROTATOR CUFF REPAIR     x 2 /right shoulder  . SKIN CANCER EXCISION     pre-melanoma / on face  . TEE WITHOUT CARDIOVERSION N/A 03/07/2016   Procedure: TRANSESOPHAGEAL ECHOCARDIOGRAM (TEE);  Surgeon: Adrian Prows, MD;  Location: [facility];  Service: Cardiovascular;  Laterality: N/A;  . TOE SURGERY     rt foot     There were no vitals filed for this visit.   Subjective Assessment - 09/25/20 1524    Subjective My right hip and buttock hurt a little more.    Currently in Pain? Yes    Pain Score 7     Pain Location Back    Pain Orientation Left;Lower    Aggravating Factors  unsure  [place]. [place], [place], [address] Phone: [phone]   Fax:  [phone]  Physical Therapy Treatment  Patient Details  Name: Linda Mcgrath MRN: [id number] Date of Birth: 1933/01/05 Referring Provider (PT): [name]   Encounter Date: 09/25/2020   PT End of Session - 09/25/20 1607    Visit Number 5    Date for PT Re-Evaluation 11/05/20    PT Start Time 1520    PT Stop Time 1616    PT Time Calculation (min) 56 min    Activity Tolerance Patient tolerated treatment well    Behavior During Therapy [facility] for tasks assessed/performed           Past Medical History:  Diagnosis Date  . Allergic rhinitis   . Arthritis   . Asthma   . Cataract    Bil/lens implant  . Colon polyp 1999   small polyp  . Diverticulosis   . Dysrhythmia   . Fibromyalgia   . Hyperlipidemia   . Hypothyroidism   . Internal hemorrhoids   . Leg cramps   . Lichen planus   . Lung nodule    stable LUL 9 mm  . Menopausal syndrome   . Osteoarthritis   . UTI (urinary tract infection)     Past Surgical History:  Procedure Laterality Date  . Abd U/S  11/1998   negative  . Abd U/S  01/2001   gallbladder polyps  . ABDOMINAL HYSTERECTOMY  1975   total-fibroids  . APPENDECTOMY  1975  . BREAST BIOPSY     benign  . BREAST EXCISIONAL BIOPSY Left 1957  . CARDIOVERSION N/A 03/07/2016   Procedure: CARDIOVERSION;  Surgeon: Adrian Prows, MD;  Location: [facility];  Service: Cardiovascular;  Laterality: N/A;  . CATARACT EXTRACTION     bilateral  . CHOLECYSTECTOMY    . COLONOSCOPY  1998   Diverticulosis; polyp\  . COLONOSCOPY  09/2002   Diverticulosis, hem  . COLONOSCOPY  12/2007   diverticulosis, polyp  . DEXA  10/2001   osteopenia  . ELECTROPHYSIOLOGIC STUDY N/A 04/03/2016   Procedure: A-Flutter Ablation;  Surgeon: Will Meredith Leeds, MD;  Location: [facility];  Service: Cardiovascular;  Laterality: N/A;  .  ESOPHAGOGASTRODUODENOSCOPY  2004  . [place] scan  01/2001   Negative  . KNEE SURGERY     right knee / 11/2004  . LAMINECTOMY WITH POSTERIOR LATERAL ARTHRODESIS LEVEL 2 N/A 07/31/2017   Procedure: DECOMPRESSIVE LAMINECTOMY LUMABR FOUR-FIVE, LUMBAR FIVE-SACRAL ONE;  Surgeon: Eustace Moore, MD;  Location: [facility];  Service: Neurosurgery;  Laterality: N/A;  . laser surgery for glaucoma Left Sep 21, 2014  . MULTIPLE TOOTH EXTRACTIONS    . ROTATOR CUFF REPAIR     x 2 /right shoulder  . SKIN CANCER EXCISION     pre-melanoma / on face  . TEE WITHOUT CARDIOVERSION N/A 03/07/2016   Procedure: TRANSESOPHAGEAL ECHOCARDIOGRAM (TEE);  Surgeon: Adrian Prows, MD;  Location: [facility];  Service: Cardiovascular;  Laterality: N/A;  . TOE SURGERY     rt foot     There were no vitals filed for this visit.   Subjective Assessment - 09/25/20 1524    Subjective My right hip and buttock hurt a little more.    Currently in Pain? Yes    Pain Score 7     Pain Location Back    Pain Orientation Left;Lower    Aggravating Factors  unsure

## 2020-09-26 ENCOUNTER — Ambulatory Visit (INDEPENDENT_AMBULATORY_CARE_PROVIDER_SITE_OTHER): Payer: Medicare Other

## 2020-09-26 DIAGNOSIS — Z0000 Encounter for general adult medical examination without abnormal findings: Secondary | ICD-10-CM

## 2020-09-26 NOTE — Patient Instructions (Signed)
Linda Mcgrath , Thank you for taking time to come for your Medicare Wellness Visit. I appreciate your ongoing commitment to your health goals. Please review the following plan we discussed and let me know if I can assist you in the future.   Screening recommendations/referrals: Colonoscopy: no longer required Mammogram: Up to date, completed 02/02/2020, due 01/2021 Bone Density: Up to date, completed 04/02/2019, due 03/2021 Recommended yearly ophthalmology/optometry visit for glaucoma screening and checkup Recommended yearly dental visit for hygiene and checkup  Vaccinations: Influenza vaccine: Up to date, completed 05/11/2020, due 03/2021 Pneumococcal vaccine: Completed series Tdap vaccine: Up to date, completed 11/07/2014, due 10/2024 Shingles vaccine: Completed series   Covid-19:Completed series  Advanced directives: Please bring a copy of your POA (Power of [place]) and/or Living Will to your next appointment.   Conditions/risks identified: hypertension, hyperlipidemia  Next appointment: Follow up in one year for your annual wellness visit    Preventive Care 77 Years and Older, Female Preventive care refers to lifestyle choices and visits with your health care provider that can promote health and wellness. What does preventive care include?  A yearly physical exam. This is also called an annual well check.  Dental exams once or twice a year.  Routine eye exams. Ask your health care provider how often you should have your eyes checked.  Personal lifestyle choices, including:  Daily care of your teeth and gums.  Regular physical activity.  Eating a healthy diet.  Avoiding tobacco and drug use.  Limiting alcohol use.  Practicing safe sex.  Taking low-dose aspirin every day.  Taking vitamin and mineral supplements as recommended by your health care provider. What happens during an annual well check? The services and screenings done by your health care provider during  your annual well check will depend on your age, overall health, lifestyle risk factors, and family history of disease. Counseling  Your health care provider may ask you questions about your:  Alcohol use.  Tobacco use.  Drug use.  Emotional well-being.  Home and relationship well-being.  Sexual activity.  Eating habits.  History of falls.  Memory and ability to understand (cognition).  Work and work Statistician.  Reproductive health. Screening  You may have the following tests or measurements:  Height, weight, and BMI.  Blood pressure.  Lipid and cholesterol levels. These may be checked every 5 years, or more frequently if you are over 28 years old.  Skin check.  Lung cancer screening. You may have this screening every year starting at age 48 if you have a 30-pack-year history of smoking and currently smoke or have quit within the past 15 years.  Fecal occult blood test (FOBT) of the stool. You may have this test every year starting at age 73.  Flexible sigmoidoscopy or colonoscopy. You may have a sigmoidoscopy every 5 years or a colonoscopy every 10 years starting at age 50.  Hepatitis C blood test.  Hepatitis B blood test.  Sexually transmitted disease (STD) testing.  Diabetes screening. This is done by checking your blood sugar (glucose) after you have not eaten for a while (fasting). You may have this done every 1-3 years.  Bone density scan. This is done to screen for osteoporosis. You may have this done starting at age 37.  Mammogram. This may be done every 1-2 years. Talk to your health care provider about how often you should have regular mammograms. Talk with your health care provider about your test results, treatment options, and if necessary, the need  for more tests. Vaccines  Your health care provider may recommend certain vaccines, such as:  Influenza vaccine. This is recommended every year.  Tetanus, diphtheria, and acellular pertussis (Tdap,  Td) vaccine. You may need a Td booster every 10 years.  Zoster vaccine. You may need this after age 38.  Pneumococcal 13-valent conjugate (PCV13) vaccine. One dose is recommended after age 17.  Pneumococcal polysaccharide (PPSV23) vaccine. One dose is recommended after age 62. Talk to your health care provider about which screenings and vaccines you need and how often you need them. This information is not intended to replace advice given to you by your health care provider. Make sure you discuss any questions you have with your health care provider. Document Released: 09/08/2015 Document Revised: 05/01/2016 Document Reviewed: 06/13/2015 Elsevier Interactive Patient Education  2017 [employer] Prevention in the Home Falls can cause injuries. They can happen to people of all ages. There are many things you can do to make your home safe and to help prevent falls. What can I do on the outside of my home?  Regularly fix the edges of walkways and driveways and fix any cracks.  Remove anything that might make you trip as you walk through a door, such as a raised step or threshold.  Trim any bushes or trees on the path to your home.  Use bright outdoor lighting.  Clear any walking paths of anything that might make someone trip, such as rocks or tools.  Regularly check to see if handrails are loose or broken. Make sure that both sides of any steps have handrails.  Any raised decks and porches should have guardrails on the edges.  Have any leaves, snow, or ice cleared regularly.  Use sand or salt on walking paths during winter.  Clean up any spills in your garage right away. This includes oil or grease spills. What can I do in the bathroom?  Use night lights.  Install grab bars by the toilet and in the tub and shower. Do not use towel bars as grab bars.  Use non-skid mats or decals in the tub or shower.  If you need to sit down in the shower, use a plastic, non-slip  stool.  Keep the floor dry. Clean up any water that spills on the floor as soon as it happens.  Remove soap buildup in the tub or shower regularly.  Attach bath mats securely with double-sided non-slip rug tape.  Do not have throw rugs and other things on the floor that can make you trip. What can I do in the bedroom?  Use night lights.  Make sure that you have a light by your bed that is easy to reach.  Do not use any sheets or blankets that are too big for your bed. They should not hang down onto the floor.  Have a firm chair that has side arms. You can use this for support while you get dressed.  Do not have throw rugs and other things on the floor that can make you trip. What can I do in the kitchen?  Clean up any spills right away.  Avoid walking on wet floors.  Keep items that you use a lot in easy-to-reach places.  If you need to reach something above you, use a strong step stool that has a grab bar.  Keep electrical cords out of the way.  Do not use floor polish or wax that makes floors slippery. If you must use wax, use  non-skid floor wax.  Do not have throw rugs and other things on the floor that can make you trip. What can I do with my stairs?  Do not leave any items on the stairs.  Make sure that there are handrails on both sides of the stairs and use them. Fix handrails that are broken or loose. Make sure that handrails are as long as the stairways.  Check any carpeting to make sure that it is firmly attached to the stairs. Fix any carpet that is loose or worn.  Avoid having throw rugs at the top or bottom of the stairs. If you do have throw rugs, attach them to the floor with carpet tape.  Make sure that you have a light switch at the top of the stairs and the bottom of the stairs. If you do not have them, ask someone to add them for you. What else can I do to help prevent falls?  Wear shoes that:  Do not have high heels.  Have rubber bottoms.  Are  comfortable and fit you well.  Are closed at the toe. Do not wear sandals.  If you use a stepladder:  Make sure that it is fully opened. Do not climb a closed stepladder.  Make sure that both sides of the stepladder are locked into place.  Ask someone to hold it for you, if possible.  Clearly mark and make sure that you can see:  Any grab bars or handrails.  First and last steps.  Where the edge of each step is.  Use tools that help you move around (mobility aids) if they are needed. These include:  Canes.  Walkers.  Scooters.  Crutches.  Turn on the lights when you go into a dark area. Replace any light bulbs as soon as they burn out.  Set up your furniture so you have a clear path. Avoid moving your furniture around.  If any of your floors are uneven, fix them.  If there are any pets around you, be aware of where they are.  Review your medicines with your doctor. Some medicines can make you feel dizzy. This can increase your chance of falling. Ask your doctor what other things that you can do to help prevent falls. This information is not intended to replace advice given to you by your health care provider. Make sure you discuss any questions you have with your health care provider. Document Released: 06/08/2009 Document Revised: 01/18/2016 Document Reviewed: 09/16/2014 Elsevier Interactive Patient Education  2017 [employer].

## 2020-09-26 NOTE — Progress Notes (Addendum)
PCP notes:  Health Maintenance: No gaps noted   Abnormal Screenings: MMSE score 19   Patient concerns: Short term memory loss Urinary incontinence   Nurse concerns: none   Next PCP appt.: none

## 2020-09-26 NOTE — Progress Notes (Signed)
Subjective:   Linda Mcgrath is a 87 y.o. female who presents for Medicare Annual (Subsequent) preventive examination.  Review of Systems: N/A      I connected with the patient today by telephone and verified that I am speaking with the correct person using two identifiers. Location patient: home Location nurse: work Persons participating in the telephone visit: patient, nurse.   I discussed the limitations, risks, security and privacy concerns of performing an evaluation and management service by telephone and the availability of in person appointments. I also discussed with the patient that there may be a patient responsible charge related to this service. The patient expressed understanding and verbally consented to this telephonic visit.        Cardiac Risk Factors include: advanced age (>29men, >36 women);hypertension;Other (see comment), Risk factor comments: hyperlipidemia     Objective:    Today's Vitals   There is no height or weight on file to calculate BMI.  Advanced Directives 09/26/2020 10/20/2019 04/14/2019 05/20/2018 08/04/2017 07/25/2017 03/19/2017  Does Patient Have a Medical Advance Directive? Yes Yes Yes Yes No Yes Yes  Type of Paramedic of [place];Living will Healthcare Power of [place] of [place] of [place];Living will -  Does patient want to make changes to medical advance directive? - - - No - Patient declined - - -  Copy of [place] in Chart? No - copy requested - No - copy requested - - No - copy requested -  Would patient like information on creating a medical advance directive? - - - - No - Patient declined - -    Current Medications (verified) Outpatient Encounter Medications as of 09/26/2020  Medication Sig  . Ascorbic Acid (VITAMIN C PO) Take 500 mg by mouth daily.  . B Complex Vitamins (VITAMIN B COMPLEX PO) Take 1 tablet by mouth  daily.  . B COMPLEX, FOLIC ACID, PO Take [id number] mcg by mouth daily.  . Biotin 10 MG TABS Take 1 tablet by mouth every morning.   . Calcium Citrate-Vitamin D 250-200 MG-UNIT TABS Take 1 tablet by mouth daily.  . cholestyramine (QUESTRAN) 4 g packet Take 1 packet (4 g total) by mouth daily.  . Cranberry 180 MG CAPS Take 2 capsules by mouth daily.  . famotidine (PEPCID) 20 MG tablet Take 1 tablet (20 mg total) by mouth 2 (two) times daily.  Marland Kitchen Fexofenadine HCl (MUCINEX ALLERGY PO) Take 1 tablet by mouth daily as needed (Allergies).   . folic acid (FOLVITE) 1 MG tablet Take 1 mg by mouth daily.  Marland Kitchen levothyroxine (SYNTHROID) 88 MCG tablet Take 1 tablet (88 mcg total) by mouth daily with breakfast.  . loratadine (CLARITIN) 10 MG tablet Take 10 mg by mouth daily as needed for allergies.   Marland Kitchen MAGNESIUM-OXIDE PO Take 1 tablet by mouth daily.  . Methenamine-Sodium Salicylate (AZO URINARY TRACT DEFENSE PO) Take 1 capsule by mouth daily.  . methocarbamol (ROBAXIN) 500 MG tablet TAKE 1 TABLET (500 MG TOTAL) BY MOUTH EVERY 8 (EIGHT) HOURS AS NEEDED FOR MUSCLE SPASMS. CAUTION OF SEDATION  . Multiple Vitamin (MULTIVITAMIN) capsule Take 1 capsule by mouth daily.  . Psyllium (METAMUCIL FIBER PO) Take by mouth at bedtime. 2 tablespoon  . Pumpkin Seed-Soy Germ (AZO BLADDER CONTROL/GO-LESS PO) Take 1 tablet by mouth 2 (two) times a week.  . TURMERIC PO Take 1 capsule by mouth 2 (two) times daily.   . vitamin B-12 (  CYANOCOBALAMIN) 500 MCG tablet Take 500 mcg by mouth daily.  . Vitamin D, Cholecalciferol, 50 MCG (2000 UT) CAPS Take 2 tablets by mouth daily.   No facility-administered encounter medications on file as of 09/26/2020.    Allergies (verified) Shellfish allergy, Tetracycline, Amlodipine besylate, Ciprofloxacin, Codeine, Fosamax [alendronate sodium], Propoxyphene hcl, Sulfamethoxazole-trimethoprim, Tape, Xarelto [rivaroxaban], Amoxicillin, Other, and Penicillins   History: Past Medical History:  Diagnosis  Date  . Allergic rhinitis   . Arthritis   . Asthma   . Cataract    Bil/lens implant  . Colon polyp 1999   small polyp  . Diverticulosis   . Dysrhythmia   . Fibromyalgia   . Hyperlipidemia   . Hypothyroidism   . Internal hemorrhoids   . Leg cramps   . Lichen planus   . Lung nodule    stable LUL 9 mm  . Menopausal syndrome   . Osteoarthritis   . UTI (urinary tract infection)    Past Surgical History:  Procedure Laterality Date  . Abd U/S  11/1998   negative  . Abd U/S  01/2001   gallbladder polyps  . ABDOMINAL HYSTERECTOMY  1975   total-fibroids  . APPENDECTOMY  1975  . BREAST BIOPSY     benign  . BREAST EXCISIONAL BIOPSY Left 1957  . CARDIOVERSION N/A 03/07/2016   Procedure: CARDIOVERSION;  Surgeon: Adrian Prows, MD;  Location: [facility];  Service: Cardiovascular;  Laterality: N/A;  . CATARACT EXTRACTION     bilateral  . CHOLECYSTECTOMY    . COLONOSCOPY  1998   Diverticulosis; polyp\  . COLONOSCOPY  09/2002   Diverticulosis, hem  . COLONOSCOPY  12/2007   diverticulosis, polyp  . DEXA  10/2001   osteopenia  . ELECTROPHYSIOLOGIC STUDY N/A 04/03/2016   Procedure: A-Flutter Ablation;  Surgeon: Will Meredith Leeds, MD;  Location: [facility];  Service: Cardiovascular;  Laterality: N/A;  . ESOPHAGOGASTRODUODENOSCOPY  2004  . Hida scan  01/2001   Negative  . KNEE SURGERY     right knee / 11/2004  . LAMINECTOMY WITH POSTERIOR LATERAL ARTHRODESIS LEVEL 2 N/A 07/31/2017   Procedure: DECOMPRESSIVE LAMINECTOMY LUMABR FOUR-FIVE, LUMBAR FIVE-SACRAL ONE;  Surgeon: Eustace Moore, MD;  Location: [facility];  Service: Neurosurgery;  Laterality: N/A;  . laser surgery for glaucoma Left Sep 21, 2014  . MULTIPLE TOOTH EXTRACTIONS    . ROTATOR CUFF REPAIR     x 2 /right shoulder  . SKIN CANCER EXCISION     pre-melanoma / on face  . TEE WITHOUT CARDIOVERSION N/A 03/07/2016   Procedure: TRANSESOPHAGEAL ECHOCARDIOGRAM (TEE);  Surgeon: Adrian Prows, MD;  [facility];  Service:  Cardiovascular;  Laterality: N/A;  . TOE SURGERY     rt foot    Family History  Problem Relation Age of Onset  . Parkinsonism Father   . Colon cancer Brother   . Allergies Mother   . Heart disease Mother   . Kidney failure Son        congenital  (kidney transplant)   . Heart failure Son        died suddenly of CHF    Social History   Socioeconomic History  . Marital status: Married    Spouse name: Not on file  . Number of children: 2  . Years of education: Not on file  . Highest education level: Not on file  Occupational History  . Occupation: TRAVEL AGENT    Employer: [employer]  Tobacco Use  . Smoking status:  Never Smoker  . Smokeless tobacco: Never Used  Vaping Use  . Vaping Use: Never used  Substance and Sexual Activity  . Alcohol use: No    Alcohol/week: 0.0 standard drinks    Comment: occasional-rare  . Drug use: No  . Sexual activity: Never  Other Topics Concern  . Not on file  Social History Narrative   Non-smoker      occ alcohol      Married-husband does the cooking      Works outside the home   Social Determinants of Radio broadcast assistant Strain: [place]   . Difficulty of Paying Living Expenses: Not hard at all  Food Insecurity: No Food Insecurity  . Worried About Charity fundraiser in the Last Year: Never true  . Ran Out of Food in the Last Year: Never true  Transportation Needs: No Transportation Needs  . Lack of Transportation (Medical): No  . Lack of Transportation (Non-Medical): No  Physical Activity: Inactive  . Days of Exercise per Week: 0 days  . Minutes of Exercise per Session: 0 min  Stress: No Stress Concern Present  . Feeling of Stress : Not at all  Social Connections: Not on file    Tobacco Counseling Counseling given: Not Answered   Clinical Intake:  Pre-visit preparation completed: Yes  Pain : 0-10 Pain Type: Chronic pain Pain Location: Knee Pain Orientation: Right Pain Descriptors / Indicators:  Aching Pain Onset: More than a month ago Pain Frequency: Intermittent     Nutritional Risks: None Diabetes: No  How often do you need to have someone help you when you read instructions, pamphlets, or other written materials from your doctor or pharmacy?: 1 - Never What is the last grade level you completed in school?: some college  Diabetic: No Nutrition Risk Assessment:  Has the patient had any N/V/D within the last 2 months?  No  Does the patient have any non-healing wounds?  No  Has the patient had any unintentional weight loss or weight gain?  No   Diabetes:  Is the patient diabetic?  No  If diabetic, was a CBG obtained today?  N/A Did the patient bring in their glucometer from home?  N/A How often do you monitor your CBG's? N/A.   Financial Strains and Diabetes Management:  Are you having any financial strains with the device, your supplies or your medication? N/A.  Does the patient want to be seen by Chronic Care Management for management of their diabetes?  N/A Would the patient like to be referred to a Nutritionist or for Diabetic Management?  N/A   Interpreter Needed?: No  Information entered by :: CJohnson, LPN   Activities of Daily Living In your present state of health, do you have any difficulty performing the following activities: 09/26/2020  Hearing? N  Vision? N  Difficulty concentrating or making decisions? Y  Comment some short term memory issues  Walking or climbing stairs? N  Dressing or bathing? N  Doing errands, shopping? N  Preparing Food and eating ? N  Using the Toilet? N  In the past six months, have you accidently leaked urine? Y  Comment wears a pad daily  Do you have problems with loss of bowel control? N  Managing your Medications? N  Managing your Finances? N  Housekeeping or managing your Housekeeping? N  Some recent data might be hidden    Patient Care Team: [name], Wynelle Fanny, MD as PCP - General Rana Snare, MD  as Consulting  Physician (Urology) Reche DixonJorizzo, Tandy GawJoseph L, MD as Referring Physician (Dermatology) Maris BergerMcCuen, Christine, MD as Consulting Physician (Ophthalmology)  Indicate any recent Medical Services you may have received from other than [facility] providers in the past year (date may be approximate).     Assessment:   This is a routine wellness examination for [place].  Hearing/Vision screen  Hearing Screening   125Hz  250Hz  500Hz  1000Hz  2000Hz  3000Hz  4000Hz  6000Hz  8000Hz   Right ear:           Left ear:           Vision Screening Comments: Patient gets annual eye exams   Dietary issues and exercise activities discussed: Current Exercise Habits: The patient does not participate in regular exercise at present, Exercise limited by: None identified  Goals    . Increase physical activity     Starting 11/15/2016, I continue to increase walking for an additional 20 minutes daily.     . Patient Stated     09/26/2020, I will maintain and continue medications as prescribed.       Depression Screen PHQ 2/9 Scores 09/26/2020 10/18/2019 03/25/2019 11/17/2017 11/15/2016 11/06/2015 10/25/2014  PHQ - 2 Score 0 5 0 0 0 0 1  PHQ- 9 Score 0 13 - - - - 3    Fall Risk Fall Risk  09/26/2020 03/25/2019 11/17/2017 11/15/2016 11/06/2015  Falls in the past year? 0 0 No No No  Number falls in past yr: 0 0 - - -  Injury with Fall? 0 0 - - -  Risk for fall due to : Impaired balance/gait - - - -  Follow up Falls evaluation completed;Falls prevention discussed Falls evaluation completed - - -    FALL RISK PREVENTION PERTAINING TO THE HOME:  Any stairs in or around the home? Yes  If so, are there any without handrails? No  Home free of loose throw rugs in walkways, pet beds, electrical cords, etc? Yes  Adequate lighting in your home to reduce risk of falls? Yes   ASSISTIVE DEVICES UTILIZED TO PREVENT FALLS:  Life alert? No  Use of a cane, walker or w/c? Yes  Grab bars in the bathroom? Yes  Shower chair or bench in shower? No  Elevated  toilet seat or a handicapped toilet? Yes   TIMED UP AND GO:  Was the test performed? N/A telephone visit .    Cognitive Function: MMSE - Mini Mental State Exam 09/26/2020 11/15/2016 11/06/2015  Orientation to time 5 5 5   Orientation to Place 5 5 5   Registration 3 3 3   Attention/ Calculation 5 0 5  Recall 0 3 3  Language- name 2 objects - 0 0  Language- repeat 1 1 1   Language- follow 3 step command - 3 3  Language- read & follow direction - 0 1  Write a sentence - 0 0  Copy design - 0 0  Total score - 20 26  Mini Cog  Mini-Cog screen was completed. Maximum score is 22. A value of 0 denotes this part of the MMSE was not completed or the patient failed this part of the Mini-Cog screening.       Immunizations Immunization History  Administered Date(s) Administered  . Influenza Split 05/11/2011  . Influenza Whole 05/27/2007, 05/21/2008, 04/29/2009, 05/28/2012  . Influenza, High Dose Seasonal PF 05/12/2014, 05/20/2016, 05/26/2017, 05/11/2020  . Influenza,inj,Quad PF,6+ Mos 05/08/2018, 04/15/2019  . Influenza-Unspecified 05/07/2013  . PFIZER(Purple Top)SARS-COV-2 Vaccination 09/11/2019, 10/02/2019, 06/10/2020  . Pneumococcal Conjugate-13 10/25/2014  .  Pneumococcal Polysaccharide-23 08/27/2003  . Td 12/26/2003  . Tdap 11/07/2014  . Zoster 03/29/2008  . Zoster Recombinat (Shingrix) 04/15/2019, 06/15/2019    TDAP status: Up to date  Flu Vaccine status: Up to date  Pneumococcal vaccine status: Up to date  Covid-19 vaccine status: Completed vaccines  Qualifies for Shingles Vaccine? Yes   Zostavax completed Yes   Shingrix Completed?: Yes  Screening Tests Health Maintenance  Topic Date Due  . MAMMOGRAM  02/01/2021  . TETANUS/TDAP  11/06/2024  . INFLUENZA VACCINE  Completed  . DEXA SCAN  Completed  . COVID-19 Vaccine  Completed  . PNA vac Low Risk Adult  Completed    Health Maintenance  There are no preventive care reminders to display for this  patient.  Colorectal cancer screening: No longer required.   Mammogram status: Completed 02/02/2020. Repeat every year  Bone Density status: Completed 04/02/2019. Results reflect: Bone density results: OSTEOPENIA. Repeat every 2 years.  Lung Cancer Screening: (Low Dose CT Chest recommended if Age 73-80 years, 30 pack-year currently smoking OR have quit w/in 15years.) does not qualify.  Additional Screening:  Hepatitis C Screening: does not qualify; Completed N/A  Vision Screening: Recommended annual ophthalmology exams for early detection of glaucoma and other disorders of the eye. Is the patient up to date with their annual eye exam?  Yes  Who is the provider or what is the name of the office in which the patient attends annual eye exams? Dr. Ellie Lunch  If pt is not established with a provider, would they like to be referred to a provider to establish care? No .   Dental Screening: Recommended annual dental exams for proper oral hygiene  Community Resource Referral / Chronic Care Management: CRR required this visit?  No   CCM required this visit?  No      Plan:     I have personally reviewed and noted the following in the patient's chart:   . Medical and social history . Use of alcohol, tobacco or illicit drugs  . Current medications and supplements . Functional ability and status . Nutritional status . Physical activity . Advanced directives . List of other physicians . Hospitalizations, surgeries, and ER visits in previous 12 months . Vitals . Screenings to include cognitive, depression, and falls . Referrals and appointments  In addition, I have reviewed and discussed with patient certain preventive protocols, quality metrics, and best practice recommendations. A written personalized care plan for preventive services as well as general preventive health recommendations were provided to patient.   Due to this being a telephonic visit, the after visit summary with patients  personalized plan was offered to patient via office or my-chart. Patient preferred to pick up at office at next visit or via mychart.   Andrez Grime, LPN   08/31/1094

## 2020-09-28 ENCOUNTER — Encounter: Payer: Self-pay | Admitting: Physical Therapy

## 2020-09-28 ENCOUNTER — Other Ambulatory Visit: Payer: Self-pay

## 2020-09-28 ENCOUNTER — Ambulatory Visit: Payer: Medicare Other | Attending: Family Medicine | Admitting: Physical Therapy

## 2020-09-28 DIAGNOSIS — M542 Cervicalgia: Secondary | ICD-10-CM | POA: Diagnosis not present

## 2020-09-28 DIAGNOSIS — M62830 Muscle spasm of back: Secondary | ICD-10-CM | POA: Insufficient documentation

## 2020-09-28 DIAGNOSIS — R262 Difficulty in walking, not elsewhere classified: Secondary | ICD-10-CM | POA: Insufficient documentation

## 2020-09-28 DIAGNOSIS — M545 Low back pain, unspecified: Secondary | ICD-10-CM | POA: Insufficient documentation

## 2020-09-28 NOTE — Therapy (Signed)
wellness exam 06/17/2013  . Screening mammogram, encounter for 06/15/2012  . Prediabetes 06/03/2011  . HYPERTENSION, BENIGN ESSENTIAL 10/23/2010  . OSTEOARTHRITIS, HANDS, BILATERAL 01/24/2010  . Hypothyroidism 08/24/2008  . Vitamin D deficiency 06/06/2008  . Hyperlipidemia 06/06/2008  . MENOPAUSAL SYNDROME 03/29/2008  . Osteopenia 03/29/2008  . ALLERGIC RHINITIS 01/25/2008  . IBS 01/25/2008  . OSTEOARTHRITIS 01/25/2008  . FIBROMYALGIA 01/25/2008    [name], PT 09/28/2020, 2:37 PM  [place] [place], [place], [address] Phone: [phone]   Fax:  [phone]  Name: Linda Mcgrath MRN: [id number] Date of Birth: 02-12-1933  [place] [place], [place], [address] Phone: [phone]   Fax:  [phone]  Physical Therapy Treatment  Patient Details  Name: Linda Mcgrath MRN: [id number] Date of Birth: 19-May-1933 Referring Provider (PT): [name]   Encounter Date: 09/28/2020   PT End of Session - 09/28/20 1433    Visit Number 6    Date for PT Re-Evaluation 11/05/20    PT Start Time [id number]    PT Stop Time 1445    PT Time Calculation (min) 50 min    Activity Tolerance Patient tolerated treatment well    Behavior During Therapy [facility] for tasks assessed/performed           Past Medical History:  Diagnosis Date  . Allergic rhinitis   . Arthritis   . Asthma   . Cataract    Bil/lens implant  . Colon polyp 1999   small polyp  . Diverticulosis   . Dysrhythmia   . Fibromyalgia   . Hyperlipidemia   . Hypothyroidism   . Internal hemorrhoids   . Leg cramps   . Lichen planus   . Lung nodule    stable LUL 9 mm  . Menopausal syndrome   . Osteoarthritis   . UTI (urinary tract infection)     Past Surgical History:  Procedure Laterality Date  . Abd U/S  11/1998   negative  . Abd U/S  01/2001   gallbladder polyps  . ABDOMINAL HYSTERECTOMY  1975   total-fibroids  . APPENDECTOMY  1975  . BREAST BIOPSY     benign  . BREAST EXCISIONAL BIOPSY Left 1957  . CARDIOVERSION N/A 03/07/2016   Procedure: CARDIOVERSION;  Surgeon: Adrian Prows, MD;  Location: [facility];  Service: Cardiovascular;  Laterality: N/A;  . CATARACT EXTRACTION     bilateral  . CHOLECYSTECTOMY    . COLONOSCOPY  1998   Diverticulosis; polyp\  . COLONOSCOPY  09/2002   Diverticulosis, hem  . COLONOSCOPY  12/2007   diverticulosis, polyp  . DEXA  10/2001   osteopenia  . ELECTROPHYSIOLOGIC STUDY N/A 04/03/2016   Procedure: A-Flutter Ablation;  Surgeon: Will Meredith Leeds, MD;  Location: [place] [facility];  Service: Cardiovascular;  Laterality: N/A;  .  ESOPHAGOGASTRODUODENOSCOPY  2004  . Hida scan  01/2001   Negative  . KNEE SURGERY     right knee / 11/2004  . LAMINECTOMY WITH POSTERIOR LATERAL ARTHRODESIS LEVEL 2 N/A 07/31/2017   Procedure: DECOMPRESSIVE LAMINECTOMY LUMABR FOUR-FIVE, LUMBAR FIVE-SACRAL ONE;  Surgeon: Eustace Moore, MD;  Location: [facility];  Service: Neurosurgery;  Laterality: N/A;  . laser surgery for glaucoma Left Sep 21, 2014  . MULTIPLE TOOTH EXTRACTIONS    . ROTATOR CUFF REPAIR     x 2 /right shoulder  . SKIN CANCER EXCISION     pre-melanoma / on face  . TEE WITHOUT CARDIOVERSION N/A 03/07/2016   Procedure: TRANSESOPHAGEAL ECHOCARDIOGRAM (TEE);  Surgeon: Adrian Prows, MD;  Location: [facility];  Service: Cardiovascular;  Laterality: N/A;  . TOE SURGERY     rt foot     There were no vitals filed for this visit.   Subjective Assessment - 09/28/20 1359    Subjective I was pretty sore after the last visit    Currently in Pain? Yes    Pain Score 8     Pain Location Back    Pain Descriptors / Indicators Aching;Sore    Aggravating Factors  I don't know if we did too  wellness exam 06/17/2013  . Screening mammogram, encounter for 06/15/2012  . Prediabetes 06/03/2011  . HYPERTENSION, BENIGN ESSENTIAL 10/23/2010  . OSTEOARTHRITIS, HANDS, BILATERAL 01/24/2010  . Hypothyroidism 08/24/2008  . Vitamin D deficiency 06/06/2008  . Hyperlipidemia 06/06/2008  . MENOPAUSAL SYNDROME 03/29/2008  . Osteopenia 03/29/2008  . ALLERGIC RHINITIS 01/25/2008  . IBS 01/25/2008  . OSTEOARTHRITIS 01/25/2008  . FIBROMYALGIA 01/25/2008    [name], PT 09/28/2020, 2:37 PM  [place] [place], [place], [address] Phone: [phone]   Fax:  [phone]  Name: Linda Mcgrath MRN: [id number] Date of Birth: 02-12-1933

## 2020-10-02 ENCOUNTER — Encounter: Payer: Self-pay | Admitting: Physical Therapy

## 2020-10-02 ENCOUNTER — Ambulatory Visit: Payer: Medicare Other | Admitting: Physical Therapy

## 2020-10-02 ENCOUNTER — Other Ambulatory Visit: Payer: Self-pay

## 2020-10-02 DIAGNOSIS — M545 Low back pain, unspecified: Secondary | ICD-10-CM

## 2020-10-02 DIAGNOSIS — M542 Cervicalgia: Secondary | ICD-10-CM | POA: Diagnosis not present

## 2020-10-02 DIAGNOSIS — M62830 Muscle spasm of back: Secondary | ICD-10-CM | POA: Diagnosis not present

## 2020-10-02 DIAGNOSIS — R262 Difficulty in walking, not elsewhere classified: Secondary | ICD-10-CM | POA: Diagnosis not present

## 2020-10-02 NOTE — Therapy (Signed)
HYPERTENSION, BENIGN ESSENTIAL 10/23/2010  . OSTEOARTHRITIS, HANDS, BILATERAL 01/24/2010  . Hypothyroidism 08/24/2008  . Vitamin D deficiency 06/06/2008  . Hyperlipidemia 06/06/2008  . MENOPAUSAL SYNDROME 03/29/2008  . Osteopenia 03/29/2008  . ALLERGIC RHINITIS 01/25/2008  . IBS 01/25/2008  . OSTEOARTHRITIS 01/25/2008  . FIBROMYALGIA 01/25/2008    [name], PT 10/02/2020, 2:39 PM  [place] [place], [place], [address] Phone: [phone]   Fax:  [phone]  Name: Linda Mcgrath MRN: [id number] Date of Birth: 1932/11/20  [place]. [place], [place], [address] Phone: [phone]   Fax:  [phone]  Physical Therapy Treatment  Patient Details  Name: Linda Mcgrath MRN: [id number] Date of Birth: 07/03/1933 Referring Provider (PT): [name]   Encounter Date: 10/02/2020   PT End of Session - 10/02/20 1433    Visit Number 7    Date for PT Re-Evaluation 11/05/20    PT Start Time 0973    PT Stop Time 1438    PT Time Calculation (min) 61 min    Activity Tolerance Patient tolerated treatment well    Behavior During Therapy [facility] for tasks assessed/performed           Past Medical History:  Diagnosis Date  . Allergic rhinitis   . Arthritis   . Asthma   . Cataract    Bil/lens implant  . Colon polyp 1999   small polyp  . Diverticulosis   . Dysrhythmia   . Fibromyalgia   . Hyperlipidemia   . Hypothyroidism   . Internal hemorrhoids   . Leg cramps   . Lichen planus   . Lung nodule    stable LUL 9 mm  . Menopausal syndrome   . Osteoarthritis   . UTI (urinary tract infection)     Past Surgical History:  Procedure Laterality Date  . Abd U/S  11/1998   negative  . Abd U/S  01/2001   gallbladder polyps  . ABDOMINAL HYSTERECTOMY  1975   total-fibroids  . APPENDECTOMY  1975  . BREAST BIOPSY     benign  . BREAST EXCISIONAL BIOPSY Left 1957  . CARDIOVERSION N/A 03/07/2016   Procedure: CARDIOVERSION;  Surgeon: Adrian Prows, MD;  Location: [place];  Service: Cardiovascular;  Laterality: N/A;  . CATARACT EXTRACTION     bilateral  . CHOLECYSTECTOMY    . COLONOSCOPY  1998   Diverticulosis; polyp\  . COLONOSCOPY  09/2002   Diverticulosis, hem  . COLONOSCOPY  12/2007   diverticulosis, polyp  . DEXA  10/2001   osteopenia  . ELECTROPHYSIOLOGIC STUDY N/A 04/03/2016   Procedure: A-Flutter Ablation;  Surgeon: Will Meredith Leeds, MD;  Location: [facility] [facility];  Service: Cardiovascular;  Laterality: N/A;  .  ESOPHAGOGASTRODUODENOSCOPY  2004  . Hida scan  01/2001   Negative  . KNEE SURGERY     right knee / 11/2004  . LAMINECTOMY WITH POSTERIOR LATERAL ARTHRODESIS LEVEL 2 N/A 07/31/2017   Procedure: DECOMPRESSIVE LAMINECTOMY LUMABR FOUR-FIVE, LUMBAR FIVE-SACRAL ONE;  Surgeon: Eustace Moore, MD;  Location: [facility];  Service: Neurosurgery;  Laterality: N/A;  . laser surgery for glaucoma Left Sep 21, 2014  . MULTIPLE TOOTH EXTRACTIONS    . ROTATOR CUFF REPAIR     x 2 /right shoulder  . SKIN CANCER EXCISION     pre-melanoma / on face  . TEE WITHOUT CARDIOVERSION N/A 03/07/2016   Procedure: TRANSESOPHAGEAL ECHOCARDIOGRAM (TEE);  Surgeon: Adrian Prows, MD;  Location: [facility];  Service: Cardiovascular;  Laterality: N/A;  . TOE SURGERY     rt foot     There were no vitals filed for this visit.   Subjective Assessment - 10/02/20 1340    Subjective The weather makes me stiff and sore    Currently in Pain? Yes    Pain Score 7     Pain Location Back    Pain Descriptors / Indicators Aching    Aggravating Factors  weather  HYPERTENSION, BENIGN ESSENTIAL 10/23/2010  . OSTEOARTHRITIS, HANDS, BILATERAL 01/24/2010  . Hypothyroidism 08/24/2008  . Vitamin D deficiency 06/06/2008  . Hyperlipidemia 06/06/2008  . MENOPAUSAL SYNDROME 03/29/2008  . Osteopenia 03/29/2008  . ALLERGIC RHINITIS 01/25/2008  . IBS 01/25/2008  . OSTEOARTHRITIS 01/25/2008  . FIBROMYALGIA 01/25/2008    [name], PT 10/02/2020, 2:39 PM  [place]. [place], [place], [address] Phone: [phone]   Fax:  [phone]  Name: Linda Mcgrath MRN: [id number] Date of Birth: 1932/11/20

## 2020-10-05 ENCOUNTER — Other Ambulatory Visit: Payer: Self-pay

## 2020-10-05 ENCOUNTER — Encounter: Payer: Self-pay | Admitting: Physical Therapy

## 2020-10-05 ENCOUNTER — Ambulatory Visit: Payer: Medicare Other | Admitting: Physical Therapy

## 2020-10-05 DIAGNOSIS — R262 Difficulty in walking, not elsewhere classified: Secondary | ICD-10-CM | POA: Diagnosis not present

## 2020-10-05 DIAGNOSIS — M542 Cervicalgia: Secondary | ICD-10-CM | POA: Diagnosis not present

## 2020-10-05 DIAGNOSIS — M545 Low back pain, unspecified: Secondary | ICD-10-CM | POA: Diagnosis not present

## 2020-10-05 DIAGNOSIS — M62830 Muscle spasm of back: Secondary | ICD-10-CM

## 2020-10-05 NOTE — Therapy (Signed)
[place] [place]. [place], [place], [address] Phone: [phone]   Fax:  [phone]  Physical Therapy Treatment  Patient Details  Name: Linda Mcgrath MRN: [id number] Date of Birth: October 24, 1932 Referring Provider (PT): [name]   Encounter Date: 10/05/2020   PT End of Session - 10/05/20 1426    Visit Number 8    Date for PT Re-Evaluation 11/05/20    PT Start Time 1343    PT Stop Time 1433    PT Time Calculation (min) 50 min    Activity Tolerance Patient tolerated treatment well    Behavior During Therapy [facility] for tasks assessed/performed           Past Medical History:  Diagnosis Date  . Allergic rhinitis   . Arthritis   . Asthma   . Cataract    Bil/lens implant  . Colon polyp 1999   small polyp  . Diverticulosis   . Dysrhythmia   . Fibromyalgia   . Hyperlipidemia   . Hypothyroidism   . Internal hemorrhoids   . Leg cramps   . Lichen planus   . Lung nodule    stable LUL 9 mm  . Menopausal syndrome   . Osteoarthritis   . UTI (urinary tract infection)     Past Surgical History:  Procedure Laterality Date  . Abd U/S  11/1998   negative  . Abd U/S  01/2001   gallbladder polyps  . ABDOMINAL HYSTERECTOMY  1975   total-fibroids  . APPENDECTOMY  1975  . BREAST BIOPSY     benign  . BREAST EXCISIONAL BIOPSY Left 1957  . CARDIOVERSION N/A 03/07/2016   Procedure: CARDIOVERSION;  Surgeon: Adrian Prows, MD;  Location: [place];  Service: Cardiovascular;  Laterality: N/A;  . CATARACT EXTRACTION     bilateral  . CHOLECYSTECTOMY    . COLONOSCOPY  1998   Diverticulosis; polyp\  . COLONOSCOPY  09/2002   Diverticulosis, hem  . COLONOSCOPY  12/2007   diverticulosis, polyp  . DEXA  10/2001   osteopenia  . ELECTROPHYSIOLOGIC STUDY N/A 04/03/2016   Procedure: A-Flutter Ablation;  Surgeon: Will Meredith Leeds, MD;  Location: [place] [facility];  Service: Cardiovascular;  Laterality: N/A;  .  ESOPHAGOGASTRODUODENOSCOPY  2004  . Hida scan  01/2001   Negative  . KNEE SURGERY     right knee / 11/2004  . LAMINECTOMY WITH POSTERIOR LATERAL ARTHRODESIS LEVEL 2 N/A 07/31/2017   Procedure: DECOMPRESSIVE LAMINECTOMY LUMABR FOUR-FIVE, LUMBAR FIVE-SACRAL ONE;  Surgeon: Eustace Moore, MD;  Location: [place];  Service: Neurosurgery;  Laterality: N/A;  . laser surgery for glaucoma Left Sep 21, 2014  . MULTIPLE TOOTH EXTRACTIONS    . ROTATOR CUFF REPAIR     x 2 /right shoulder  . SKIN CANCER EXCISION     pre-melanoma / on face  . TEE WITHOUT CARDIOVERSION N/A 03/07/2016   Procedure: TRANSESOPHAGEAL ECHOCARDIOGRAM (TEE);  Surgeon: Adrian Prows, MD;  Location: [facility];  Service: Cardiovascular;  Laterality: N/A;  . TOE SURGERY     rt foot     There were no vitals filed for this visit.   Subjective Assessment - 10/05/20 1342    Subjective I think this is helping, I still am hurting, but I know I am moving better    Currently in Pain? Yes    Pain Score 6     Pain Location Back    Pain Orientation Lower    Aggravating Factors  worse  deficiency 10/25/2014  . Lichen planus 55/21/7471  . Encounter for Medicare annual wellness exam 06/17/2013  . Screening mammogram, encounter for 06/15/2012  . Prediabetes 06/03/2011  . HYPERTENSION, BENIGN ESSENTIAL 10/23/2010  . OSTEOARTHRITIS, HANDS, BILATERAL 01/24/2010  . Hypothyroidism 08/24/2008  . Vitamin D deficiency 06/06/2008  . Hyperlipidemia 06/06/2008  . MENOPAUSAL SYNDROME 03/29/2008  . Osteopenia 03/29/2008  . ALLERGIC RHINITIS 01/25/2008  . IBS 01/25/2008  . OSTEOARTHRITIS 01/25/2008  . FIBROMYALGIA 01/25/2008    [name], PT 10/05/2020, 2:46 PM  [place]. [place], [place], [address] Phone: [phone]   Fax:  [phone]  Name: Linda Mcgrath MRN: [id number] Date of Birth: May 05, 1933  deficiency 10/25/2014  . Lichen planus 55/21/7471  . Encounter for Medicare annual wellness exam 06/17/2013  . Screening mammogram, encounter for 06/15/2012  . Prediabetes 06/03/2011  . HYPERTENSION, BENIGN ESSENTIAL 10/23/2010  . OSTEOARTHRITIS, HANDS, BILATERAL 01/24/2010  . Hypothyroidism 08/24/2008  . Vitamin D deficiency 06/06/2008  . Hyperlipidemia 06/06/2008  . MENOPAUSAL SYNDROME 03/29/2008  . Osteopenia 03/29/2008  . ALLERGIC RHINITIS 01/25/2008  . IBS 01/25/2008  . OSTEOARTHRITIS 01/25/2008  . FIBROMYALGIA 01/25/2008    [name], PT 10/05/2020, 2:46 PM  [place]. [place], [place], [address] Phone: [phone]   Fax:  [phone]  Name: Linda Mcgrath MRN: [id number] Date of Birth: May 05, 1933

## 2020-10-13 ENCOUNTER — Ambulatory Visit: Payer: Medicare Other | Admitting: Physical Therapy

## 2020-10-13 ENCOUNTER — Other Ambulatory Visit: Payer: Self-pay

## 2020-10-13 ENCOUNTER — Encounter: Payer: Self-pay | Admitting: Physical Therapy

## 2020-10-13 DIAGNOSIS — M62830 Muscle spasm of back: Secondary | ICD-10-CM | POA: Diagnosis not present

## 2020-10-13 DIAGNOSIS — M542 Cervicalgia: Secondary | ICD-10-CM

## 2020-10-13 DIAGNOSIS — M545 Low back pain, unspecified: Secondary | ICD-10-CM

## 2020-10-13 DIAGNOSIS — R262 Difficulty in walking, not elsewhere classified: Secondary | ICD-10-CM | POA: Diagnosis not present

## 2020-10-13 NOTE — Therapy (Signed)
PT  LONG TERM GOAL #1   Title decrease pain 50%    Status On-going      PT LONG TERM GOAL #2   Title increase lumbar ROM 25%    Status Partially Met      PT LONG TERM GOAL #3   Title Understand proper posture and body mechanics for housework and yardwork tasks.     Status Achieved                 Plan - 10/13/20 1005    Clinical Impression Statement I did a reassess ment today, Linda Mcgrath decreased her TUG time, and increased her BERG balance score, this will hopefully decrease her risk for falls.  I increased her walking today, at the end of about 300 feet she c/o sore knees and fatigue in the back.  She feels tha tshe is stronger and tolerates more but is still having pain and decreased tolerance to activity    PT Next Visit Plan again try to walk more if we can without much pain    Consulted and Agree with Plan of Care Patient           Patient will benefit from skilled therapeutic intervention in order to improve the following deficits and impairments:  Abnormal gait,Decreased range of motion,Difficulty walking,Increased muscle spasms,Decreased activity tolerance,Pain,Decreased balance,Impaired flexibility,Improper body mechanics,Postural dysfunction,Decreased strength,Decreased mobility  Visit Diagnosis: Acute bilateral low back pain without sciatica  Muscle spasm of back  Difficulty in walking, not elsewhere classified  Cervicalgia     Problem List Patient Active Problem List   Diagnosis Date Noted  . Chronic upper back pain 08/30/2020  . Aortic atherosclerosis ([place]) 05/31/2020  . Abnormal CT scan, small bowel 05/18/2020  . Generalized abdominal pain 05/17/2020  . Duodenitis 03/13/2020  . Right lateral abdominal pain 03/07/2020  . Insomnia 12/15/2019  . Chronic low back pain 10/18/2019  . Positive depression screening 10/18/2019  . Osteoarthritis of right knee 08/02/2019  . Diarrhea 07/03/2018  . Dyspepsia 07/03/2018  . S/P lumbar spinal fusion 07/31/2017  .  Right low back pain 03/04/2017  . H/O atrial flutter 03/06/2016  . Obesity 11/06/2015  . Urge incontinence 01/24/2015  . Estrogen deficiency 10/25/2014  . Lichen planus 77/82/4235  . Encounter for Medicare annual wellness exam 06/17/2013  . Screening mammogram, encounter for 06/15/2012  . Prediabetes 06/03/2011  . HYPERTENSION, BENIGN ESSENTIAL 10/23/2010  . OSTEOARTHRITIS, HANDS, BILATERAL 01/24/2010  . Hypothyroidism 08/24/2008  . Vitamin D deficiency 06/06/2008  . Hyperlipidemia 06/06/2008  . MENOPAUSAL SYNDROME 03/29/2008  . Osteopenia 03/29/2008  . ALLERGIC RHINITIS 01/25/2008  . IBS 01/25/2008  . OSTEOARTHRITIS 01/25/2008  . FIBROMYALGIA 01/25/2008    [name], PT 10/13/2020, 10:09 AM  [place]. [place], [place], [address] Phone: [phone]   Fax:  [phone]  Name: Linda Mcgrath MRN: [id number] Date of Birth: August 15, 1933  [place]. [place], [place], [address] Phone: [phone]   Fax:  [phone]  Physical Therapy Treatment  Patient Details  Name: Linda Mcgrath MRN: [id number] Date of Birth: October 02, 1932 Referring Provider (PT): [name]   Encounter Date: 10/13/2020   PT End of Session - 10/13/20 1004    Visit Number 9    Date for PT Re-Evaluation 11/05/20    PT Start Time 0908    PT Stop Time 1008    PT Time Calculation (min) 60 min    Activity Tolerance Patient tolerated treatment well    Behavior During Therapy [facility] for tasks assessed/performed           Past Medical History:  Diagnosis Date  . Allergic rhinitis   . Arthritis   . Asthma   . Cataract    Bil/lens implant  . Colon polyp 1999   small polyp  . Diverticulosis   . Dysrhythmia   . Fibromyalgia   . Hyperlipidemia   . Hypothyroidism   . Internal hemorrhoids   . Leg cramps   . Lichen planus   . Lung nodule    stable LUL 9 mm  . Menopausal syndrome   . Osteoarthritis   . UTI (urinary tract infection)     Past Surgical History:  Procedure Laterality Date  . Abd U/S  11/1998   negative  . Abd U/S  01/2001   gallbladder polyps  . ABDOMINAL HYSTERECTOMY  1975   total-fibroids  . APPENDECTOMY  1975  . BREAST BIOPSY     benign  . BREAST EXCISIONAL BIOPSY Left 1957  . CARDIOVERSION N/A 03/07/2016   Procedure: CARDIOVERSION;  Surgeon: Adrian Prows, MD;  Location: [place];  Service: Cardiovascular;  Laterality: N/A;  . CATARACT EXTRACTION     bilateral  . CHOLECYSTECTOMY    . COLONOSCOPY  1998   Diverticulosis; polyp\  . COLONOSCOPY  09/2002   Diverticulosis, hem  . COLONOSCOPY  12/2007   diverticulosis, polyp  . DEXA  10/2001   osteopenia  . ELECTROPHYSIOLOGIC STUDY N/A 04/03/2016   Procedure: A-Flutter Ablation;  Surgeon: Will Meredith Leeds, MD;  Location: [place] [facility];  Service: Cardiovascular;  Laterality: N/A;  .  ESOPHAGOGASTRODUODENOSCOPY  2004  . [place] scan  01/2001   Negative  . KNEE SURGERY     right knee / 11/2004  . LAMINECTOMY WITH POSTERIOR LATERAL ARTHRODESIS LEVEL 2 N/A 07/31/2017   Procedure: DECOMPRESSIVE LAMINECTOMY LUMABR FOUR-FIVE, LUMBAR FIVE-SACRAL ONE;  Surgeon: Eustace Moore, MD;  Location: [place];  Service: Neurosurgery;  Laterality: N/A;  . laser surgery for glaucoma Left Sep 21, 2014  . MULTIPLE TOOTH EXTRACTIONS    . ROTATOR CUFF REPAIR     x 2 /right shoulder  . SKIN CANCER EXCISION     pre-melanoma / on face  . TEE WITHOUT CARDIOVERSION N/A 03/07/2016   Procedure: TRANSESOPHAGEAL ECHOCARDIOGRAM (TEE);  Surgeon: Adrian Prows, MD;  Location: [place];  Service: Cardiovascular;  Laterality: N/A;  . TOE SURGERY     rt foot     There were no vitals filed for this visit.   Subjective Assessment - 10/13/20 0906    Subjective This cold front an dwind seems to bothered my back, reports that her pain is b/n the shoulder blades    Currently in Pain? Yes    Pain Score 6     Pain Location Back    Pain Orientation Mid;Upper;Lower    Pain  PT  LONG TERM GOAL #1   Title decrease pain 50%    Status On-going      PT LONG TERM GOAL #2   Title increase lumbar ROM 25%    Status Partially Met      PT LONG TERM GOAL #3   Title Understand proper posture and body mechanics for housework and yardwork tasks.     Status Achieved                 Plan - 10/13/20 1005    Clinical Impression Statement I did a reassess ment today, Linda Mcgrath decreased her TUG time, and increased her BERG balance score, this will hopefully decrease her risk for falls.  I increased her walking today, at the end of about 300 feet she c/o sore knees and fatigue in the back.  She feels tha tshe is stronger and tolerates more but is still having pain and decreased tolerance to activity    PT Next Visit Plan again try to walk more if we can without much pain    Consulted and Agree with Plan of Care Patient           Patient will benefit from skilled therapeutic intervention in order to improve the following deficits and impairments:  Abnormal gait,Decreased range of motion,Difficulty walking,Increased muscle spasms,Decreased activity tolerance,Pain,Decreased balance,Impaired flexibility,Improper body mechanics,Postural dysfunction,Decreased strength,Decreased mobility  Visit Diagnosis: Acute bilateral low back pain without sciatica  Muscle spasm of back  Difficulty in walking, not elsewhere classified  Cervicalgia     Problem List Patient Active Problem List   Diagnosis Date Noted  . Chronic upper back pain 08/30/2020  . Aortic atherosclerosis ([place]) 05/31/2020  . Abnormal CT scan, small bowel 05/18/2020  . Generalized abdominal pain 05/17/2020  . Duodenitis 03/13/2020  . Right lateral abdominal pain 03/07/2020  . Insomnia 12/15/2019  . Chronic low back pain 10/18/2019  . Positive depression screening 10/18/2019  . Osteoarthritis of right knee 08/02/2019  . Diarrhea 07/03/2018  . Dyspepsia 07/03/2018  . S/P lumbar spinal fusion 07/31/2017  .  Right low back pain 03/04/2017  . H/O atrial flutter 03/06/2016  . Obesity 11/06/2015  . Urge incontinence 01/24/2015  . Estrogen deficiency 10/25/2014  . Lichen planus 77/82/4235  . Encounter for Medicare annual wellness exam 06/17/2013  . Screening mammogram, encounter for 06/15/2012  . Prediabetes 06/03/2011  . HYPERTENSION, BENIGN ESSENTIAL 10/23/2010  . OSTEOARTHRITIS, HANDS, BILATERAL 01/24/2010  . Hypothyroidism 08/24/2008  . Vitamin D deficiency 06/06/2008  . Hyperlipidemia 06/06/2008  . MENOPAUSAL SYNDROME 03/29/2008  . Osteopenia 03/29/2008  . ALLERGIC RHINITIS 01/25/2008  . IBS 01/25/2008  . OSTEOARTHRITIS 01/25/2008  . FIBROMYALGIA 01/25/2008    [name], PT 10/13/2020, 10:09 AM  [place]. [place], [place], [address] Phone: [phone]   Fax:  [phone]  Name: Linda Mcgrath MRN: [id number] Date of Birth: August 15, 1933

## 2020-10-14 IMAGING — MG DIGITAL SCREENING BILAT W/ TOMO W/ CAD
8 series · 8 of 24 positions shown · non-contrast
Comparison: Previous exam(s).

CLINICAL DATA: Screening.

EXAM:
DIGITAL SCREENING BILATERAL MAMMOGRAM WITH TOMO AND CAD

[L CC synth-2D]
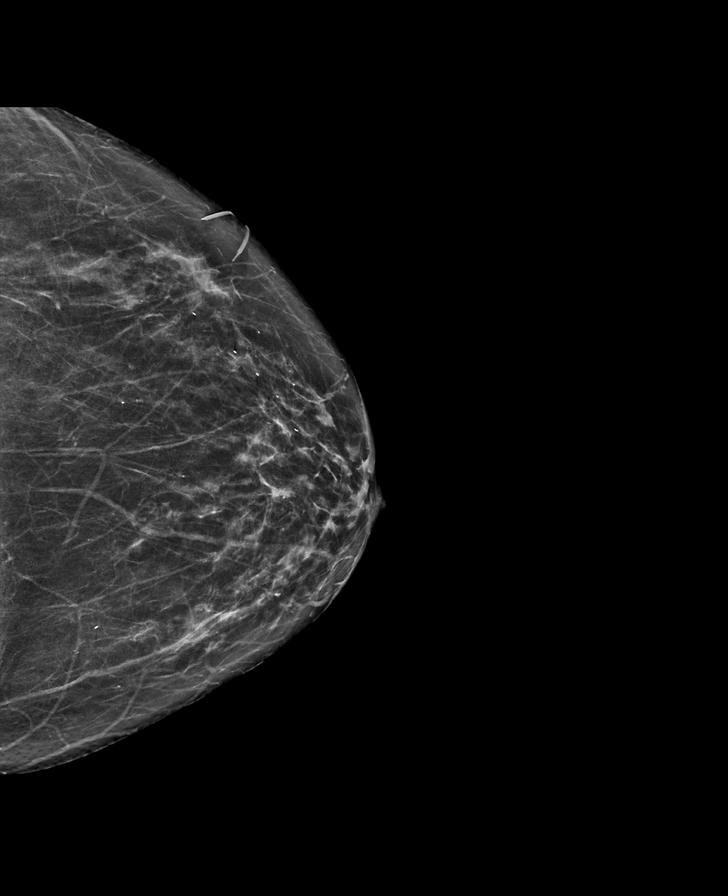

[R CC synth-2D]
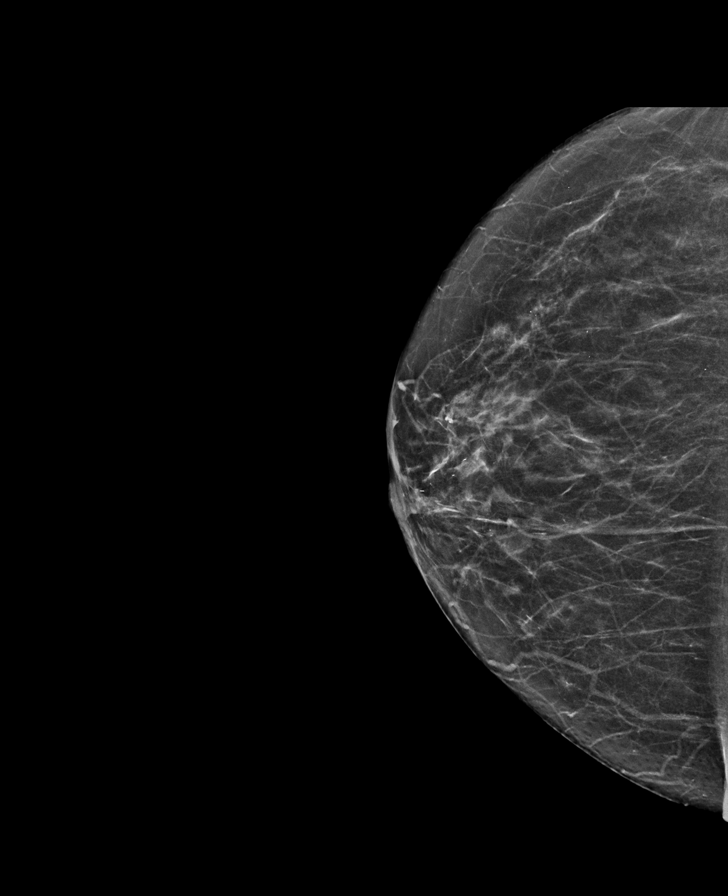

[L MLO synth-2D]
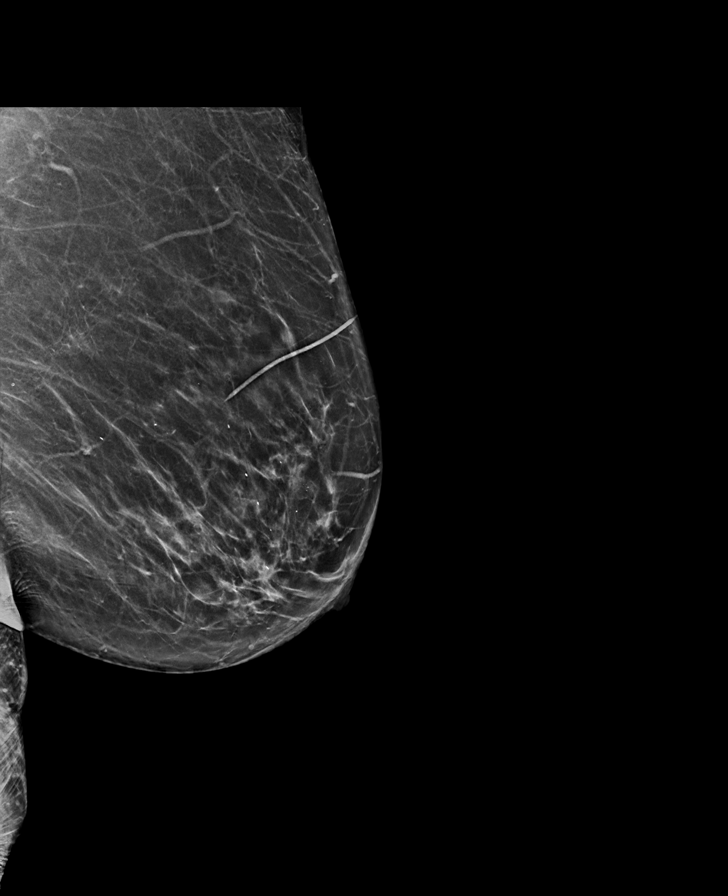

[R MLO synth-2D]
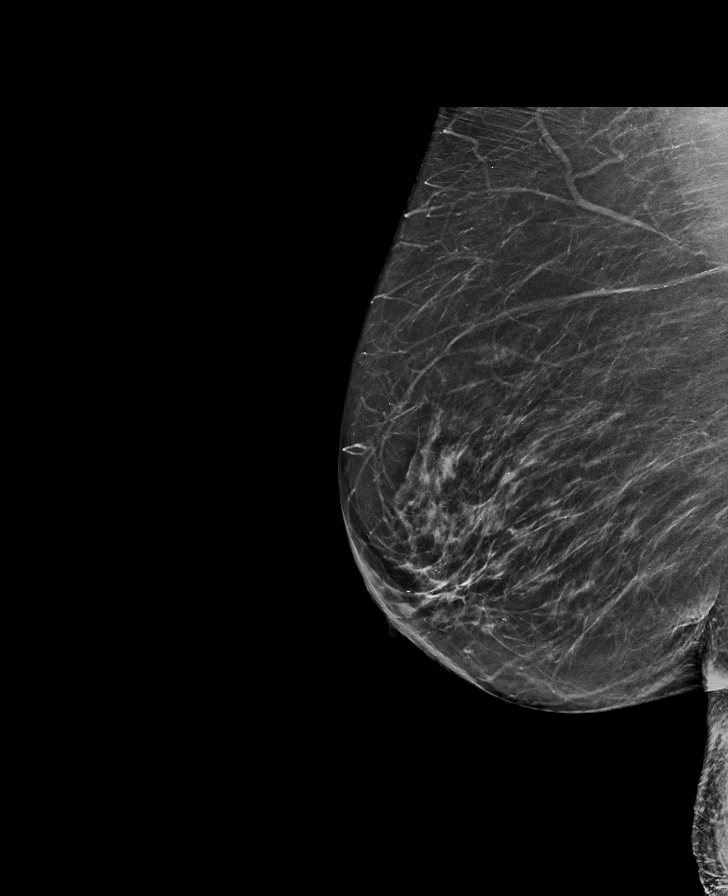

[L MLO tomo · tomo slice 35/69.0]
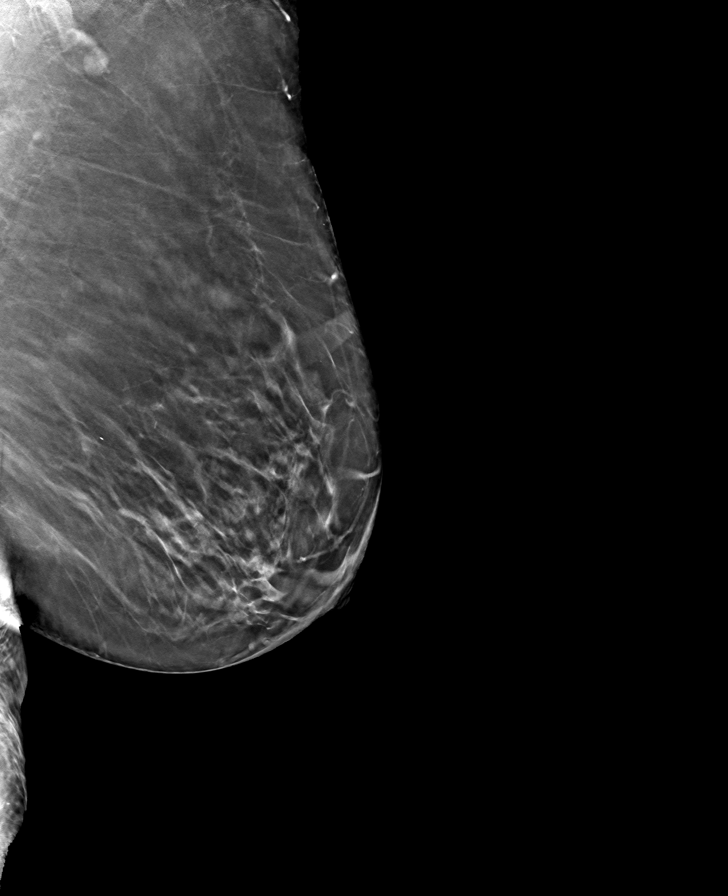

[R MLO tomo · tomo slice 35/68.0]
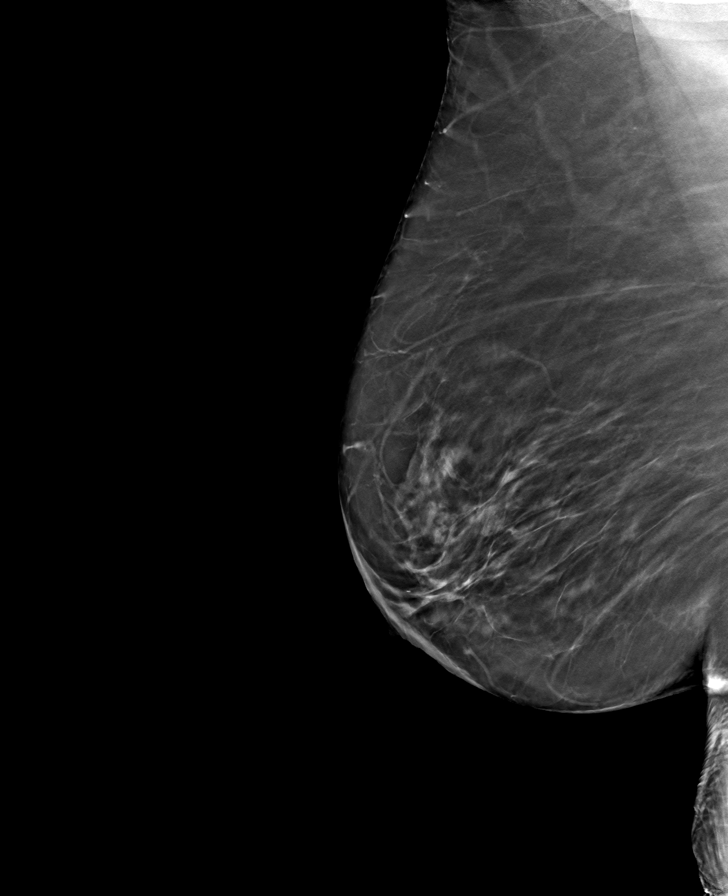

[R CC tomo · tomo slice 29/57.0]
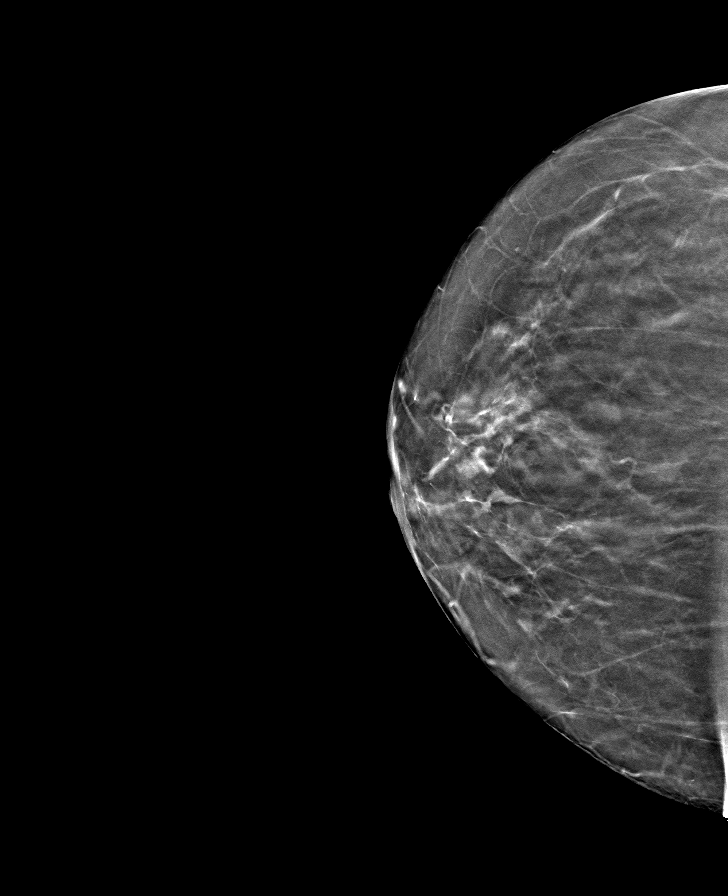

[L CC tomo · tomo slice 30/59.0]
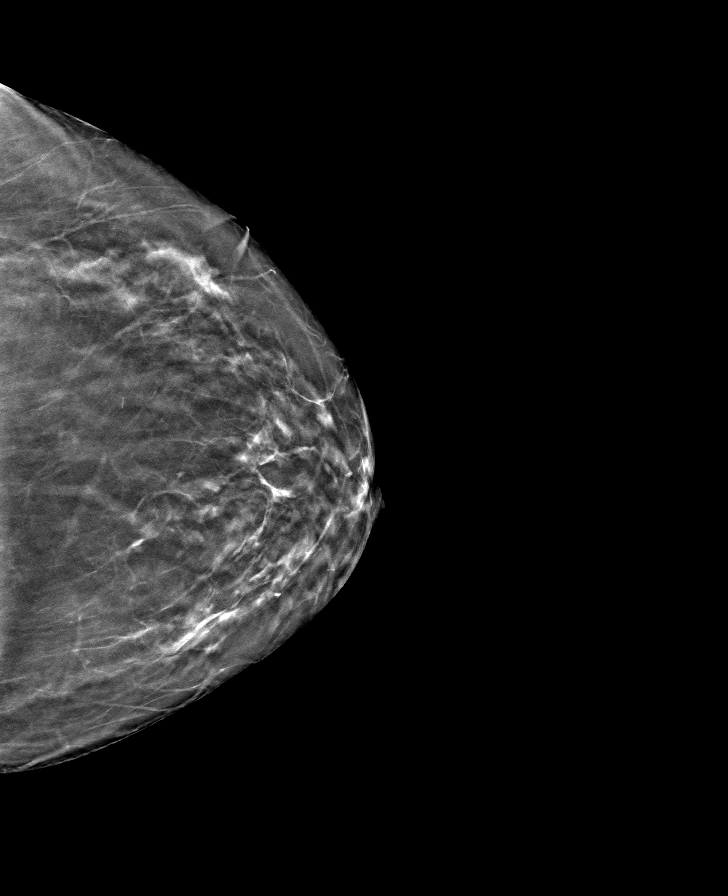

[8 of 24 positions shown; findings below may reference images not displayed]

ACR Breast Density Category b: There are scattered areas of
fibroglandular density.
FINDINGS: There are no findings suspicious for malignancy. Images were
processed with CAD.
IMPRESSION: No mammographic evidence of malignancy. A result letter of this
screening mammogram will be mailed directly to the patient.

RECOMMENDATION:
Screening mammogram in one year. (Code:CN-U-775)

BI-RADS CATEGORY  1: Negative.

## 2020-10-16 ENCOUNTER — Encounter: Payer: Self-pay | Admitting: Physical Therapy

## 2020-10-16 ENCOUNTER — Other Ambulatory Visit: Payer: Self-pay

## 2020-10-16 ENCOUNTER — Ambulatory Visit: Payer: Medicare Other | Admitting: Physical Therapy

## 2020-10-16 DIAGNOSIS — M62830 Muscle spasm of back: Secondary | ICD-10-CM | POA: Diagnosis not present

## 2020-10-16 DIAGNOSIS — M545 Low back pain, unspecified: Secondary | ICD-10-CM

## 2020-10-16 DIAGNOSIS — M542 Cervicalgia: Secondary | ICD-10-CM

## 2020-10-16 DIAGNOSIS — R262 Difficulty in walking, not elsewhere classified: Secondary | ICD-10-CM | POA: Diagnosis not present

## 2020-10-16 NOTE — Therapy (Signed)
Dyspepsia 07/03/2018  . S/P lumbar spinal fusion 07/31/2017  . Right low back pain 03/04/2017  . H/O atrial flutter 03/06/2016  . Obesity 11/06/2015  . Urge incontinence 01/24/2015  . Estrogen deficiency 10/25/2014  . Lichen planus 45/10/8880  . Encounter for Medicare annual wellness exam 06/17/2013  . Screening mammogram, encounter for 06/15/2012  . Prediabetes 06/03/2011  . HYPERTENSION, BENIGN ESSENTIAL 10/23/2010  . OSTEOARTHRITIS, HANDS, BILATERAL 01/24/2010  . Hypothyroidism 08/24/2008  . Vitamin D deficiency 06/06/2008  . Hyperlipidemia 06/06/2008  . MENOPAUSAL SYNDROME 03/29/2008  . Osteopenia 03/29/2008  . ALLERGIC RHINITIS 01/25/2008  . IBS 01/25/2008  . OSTEOARTHRITIS 01/25/2008  . FIBROMYALGIA 01/25/2008    [name], PT 10/16/2020, 10:42 AM  [place]. [place], [place], [address] Phone: [phone]   Fax:  [phone]  Name: Linda Mcgrath MRN: [id number] Date of Birth: 1933/03/11  [place]. [place], [place], [address] Phone: [phone]   Fax:  [phone] Progress Note Reporting Period 09/07/20 to 10/16/20 for the first 10 visits  See note below for Objective Data and Assessment of Progress/Goals.      Physical Therapy Treatment  Patient Details  Name: Linda Mcgrath MRN: [id number] Date of Birth: 06/03/1933 Referring Provider (PT): [name]   Encounter Date: 10/16/2020   PT End of Session - 10/16/20 1039    Visit Number 10    Date for PT Re-Evaluation 11/05/20    PT Start Time 1005    PT Stop Time 1055    PT Time Calculation (min) 50 min    Activity Tolerance Patient tolerated treatment well    Behavior During Therapy [facility] for tasks assessed/performed           Past Medical History:  Diagnosis Date  . Allergic rhinitis   . Arthritis   . Asthma   . Cataract    Bil/lens implant  . Colon polyp 1999   small polyp  . Diverticulosis   . Dysrhythmia   . Fibromyalgia   . Hyperlipidemia   . Hypothyroidism   . Internal hemorrhoids   . Leg cramps   . Lichen planus   . Lung nodule    stable LUL 9 mm  . Menopausal syndrome   . Osteoarthritis   . UTI (urinary tract infection)     Past Surgical History:  Procedure Laterality Date  . Abd U/S  11/1998   negative  . Abd U/S  01/2001   gallbladder polyps  . ABDOMINAL HYSTERECTOMY  1975   total-fibroids  . APPENDECTOMY  1975  . BREAST BIOPSY     benign  . BREAST EXCISIONAL BIOPSY Left 1957  . CARDIOVERSION N/A 03/07/2016   Procedure: CARDIOVERSION;  Surgeon: Adrian Prows, MD;  Location: [place] [facility];  Service: Cardiovascular;  Laterality: N/A;  . CATARACT EXTRACTION     bilateral  . CHOLECYSTECTOMY    . COLONOSCOPY  1998   Diverticulosis; polyp\  . COLONOSCOPY  09/2002   Diverticulosis, hem  . COLONOSCOPY  12/2007   diverticulosis, polyp  . DEXA  10/2001   osteopenia  . ELECTROPHYSIOLOGIC STUDY N/A 04/03/2016    Procedure: A-Flutter Ablation;  Surgeon: Will Meredith Leeds, MD;  Location: [place] [facility];  Service: Cardiovascular;  Laterality: N/A;  . ESOPHAGOGASTRODUODENOSCOPY  2004  . [place] scan  01/2001   Negative  . KNEE SURGERY     right knee / 11/2004  . LAMINECTOMY WITH POSTERIOR LATERAL ARTHRODESIS LEVEL 2 N/A 07/31/2017   Procedure: DECOMPRESSIVE LAMINECTOMY LUMABR FOUR-FIVE, LUMBAR FIVE-SACRAL ONE;  Surgeon: Eustace Moore, MD;  Location: [place];  Service: Neurosurgery;  Laterality: N/A;  . laser surgery for glaucoma Left Sep 21, 2014  . MULTIPLE TOOTH EXTRACTIONS    . ROTATOR CUFF REPAIR     x 2 /right shoulder  . SKIN CANCER EXCISION     pre-melanoma / on face  . TEE WITHOUT CARDIOVERSION N/A 03/07/2016   Procedure: TRANSESOPHAGEAL ECHOCARDIOGRAM (TEE);  Surgeon: Adrian Prows, MD;  Location: [facility];  Service: Cardiovascular;  Laterality: N/A;  . TOE SURGERY     rt foot     There were no vitals filed for this visit.   Subjective Assessment - 10/16/20 0959    Subjective I feel a little better, not as stiff and tight    Currently in Pain? Yes    Pain Score  [place]. [place], [place], [address] Phone: [phone]   Fax:  [phone] Progress Note Reporting Period 09/07/20 to 10/16/20 for the first 10 visits  See note below for Objective Data and Assessment of Progress/Goals.      Physical Therapy Treatment  Patient Details  Name: Linda Mcgrath MRN: [id number] Date of Birth: 06/03/1933 Referring Provider (PT): [name]   Encounter Date: 10/16/2020   PT End of Session - 10/16/20 1039    Visit Number 10    Date for PT Re-Evaluation 11/05/20    PT Start Time 1005    PT Stop Time 1055    PT Time Calculation (min) 50 min    Activity Tolerance Patient tolerated treatment well    Behavior During Therapy [facility] for tasks assessed/performed           Past Medical History:  Diagnosis Date  . Allergic rhinitis   . Arthritis   . Asthma   . Cataract    Bil/lens implant  . Colon polyp 1999   small polyp  . Diverticulosis   . Dysrhythmia   . Fibromyalgia   . Hyperlipidemia   . Hypothyroidism   . Internal hemorrhoids   . Leg cramps   . Lichen planus   . Lung nodule    stable LUL 9 mm  . Menopausal syndrome   . Osteoarthritis   . UTI (urinary tract infection)     Past Surgical History:  Procedure Laterality Date  . Abd U/S  11/1998   negative  . Abd U/S  01/2001   gallbladder polyps  . ABDOMINAL HYSTERECTOMY  1975   total-fibroids  . APPENDECTOMY  1975  . BREAST BIOPSY     benign  . BREAST EXCISIONAL BIOPSY Left 1957  . CARDIOVERSION N/A 03/07/2016   Procedure: CARDIOVERSION;  Surgeon: Adrian Prows, MD;  Location: [place];  Service: Cardiovascular;  Laterality: N/A;  . CATARACT EXTRACTION     bilateral  . CHOLECYSTECTOMY    . COLONOSCOPY  1998   Diverticulosis; polyp\  . COLONOSCOPY  09/2002   Diverticulosis, hem  . COLONOSCOPY  12/2007   diverticulosis, polyp  . DEXA  10/2001   osteopenia  . ELECTROPHYSIOLOGIC STUDY N/A 04/03/2016    Procedure: A-Flutter Ablation;  Surgeon: Will Meredith Leeds, MD;  Location: [place] [facility];  Service: Cardiovascular;  Laterality: N/A;  . ESOPHAGOGASTRODUODENOSCOPY  2004  . [place] scan  01/2001   Negative  . KNEE SURGERY     right knee / 11/2004  . LAMINECTOMY WITH POSTERIOR LATERAL ARTHRODESIS LEVEL 2 N/A 07/31/2017   Procedure: DECOMPRESSIVE LAMINECTOMY LUMABR FOUR-FIVE, LUMBAR FIVE-SACRAL ONE;  Surgeon: Eustace Moore, MD;  Location: [place];  Service: Neurosurgery;  Laterality: N/A;  . laser surgery for glaucoma Left Sep 21, 2014  . MULTIPLE TOOTH EXTRACTIONS    . ROTATOR CUFF REPAIR     x 2 /right shoulder  . SKIN CANCER EXCISION     pre-melanoma / on face  . TEE WITHOUT CARDIOVERSION N/A 03/07/2016   Procedure: TRANSESOPHAGEAL ECHOCARDIOGRAM (TEE);  Surgeon: Adrian Prows, MD;  Location: [facility];  Service: Cardiovascular;  Laterality: N/A;  . TOE SURGERY     rt foot     There were no vitals filed for this visit.   Subjective Assessment - 10/16/20 0959    Subjective I feel a little better, not as stiff and tight    Currently in Pain? Yes    Pain Score

## 2020-10-19 ENCOUNTER — Other Ambulatory Visit: Payer: Self-pay

## 2020-10-19 ENCOUNTER — Encounter: Payer: Self-pay | Admitting: Physical Therapy

## 2020-10-19 ENCOUNTER — Ambulatory Visit: Payer: Medicare Other | Admitting: Physical Therapy

## 2020-10-19 DIAGNOSIS — R262 Difficulty in walking, not elsewhere classified: Secondary | ICD-10-CM | POA: Diagnosis not present

## 2020-10-19 DIAGNOSIS — M62830 Muscle spasm of back: Secondary | ICD-10-CM | POA: Diagnosis not present

## 2020-10-19 DIAGNOSIS — M545 Low back pain, unspecified: Secondary | ICD-10-CM | POA: Diagnosis not present

## 2020-10-19 DIAGNOSIS — M542 Cervicalgia: Secondary | ICD-10-CM | POA: Diagnosis not present

## 2020-10-19 NOTE — Therapy (Signed)
10/18/2019  . Osteoarthritis of right knee 08/02/2019  . Diarrhea 07/03/2018  . Dyspepsia 07/03/2018  . S/P lumbar spinal fusion 07/31/2017  . Right low back pain 03/04/2017  . H/O atrial flutter 03/06/2016  . Obesity 11/06/2015  . Urge incontinence 01/24/2015  . Estrogen deficiency 10/25/2014  . Lichen planus 11/14/2246  . Encounter for Medicare annual wellness exam 06/17/2013  . Screening mammogram, encounter for 06/15/2012  . Prediabetes 06/03/2011  . HYPERTENSION, BENIGN ESSENTIAL 10/23/2010  . OSTEOARTHRITIS, HANDS, BILATERAL 01/24/2010  . Hypothyroidism 08/24/2008  . Vitamin D deficiency 06/06/2008  . Hyperlipidemia 06/06/2008  . MENOPAUSAL SYNDROME 03/29/2008  . Osteopenia 03/29/2008  . ALLERGIC RHINITIS 01/25/2008  . IBS 01/25/2008  . OSTEOARTHRITIS 01/25/2008  . FIBROMYALGIA 01/25/2008    [name], PT 10/19/2020, 5:12 PM  [place]. [place], [place], [address] Phone: [phone]   Fax:  [phone]  Name: Linda Mcgrath MRN: [id number] Date of Birth: 1932/09/11  10/18/2019  . Osteoarthritis of right knee 08/02/2019  . Diarrhea 07/03/2018  . Dyspepsia 07/03/2018  . S/P lumbar spinal fusion 07/31/2017  . Right low back pain 03/04/2017  . H/O atrial flutter 03/06/2016  . Obesity 11/06/2015  . Urge incontinence 01/24/2015  . Estrogen deficiency 10/25/2014  . Lichen planus 11/14/2246  . Encounter for Medicare annual wellness exam 06/17/2013  . Screening mammogram, encounter for 06/15/2012  . Prediabetes 06/03/2011  . HYPERTENSION, BENIGN ESSENTIAL 10/23/2010  . OSTEOARTHRITIS, HANDS, BILATERAL 01/24/2010  . Hypothyroidism 08/24/2008  . Vitamin D deficiency 06/06/2008  . Hyperlipidemia 06/06/2008  . MENOPAUSAL SYNDROME 03/29/2008  . Osteopenia 03/29/2008  . ALLERGIC RHINITIS 01/25/2008  . IBS 01/25/2008  . OSTEOARTHRITIS 01/25/2008  . FIBROMYALGIA 01/25/2008    [name], PT 10/19/2020, 5:12 PM  [place]. [place], [place], [address] Phone: [phone]   Fax:  [phone]  Name: Linda Mcgrath MRN: [id number] Date of Birth: 1932/09/11  [place]. [place], [place], [address] Phone: [phone]   Fax:  [phone]  Physical Therapy Treatment  Patient Details  Name: Linda Mcgrath MRN: [id number] Date of Birth: 05/26/1933 Referring Provider (PT): [name]   Encounter Date: 10/19/2020   PT End of Session - 10/19/20 1707    Visit Number 11    Date for PT Re-Evaluation 11/05/20    PT Start Time 9150    PT Stop Time 5697    PT Time Calculation (min) 49 min    Activity Tolerance Patient tolerated treatment well    Behavior During Therapy [facility] for tasks assessed/performed           Past Medical History:  Diagnosis Date  . Allergic rhinitis   . Arthritis   . Asthma   . Cataract    Bil/lens implant  . Colon polyp 1999   small polyp  . Diverticulosis   . Dysrhythmia   . Fibromyalgia   . Hyperlipidemia   . Hypothyroidism   . Internal hemorrhoids   . Leg cramps   . Lichen planus   . Lung nodule    stable LUL 9 mm  . Menopausal syndrome   . Osteoarthritis   . UTI (urinary tract infection)     Past Surgical History:  Procedure Laterality Date  . Abd U/S  11/1998   negative  . Abd U/S  01/2001   gallbladder polyps  . ABDOMINAL HYSTERECTOMY  1975   total-fibroids  . APPENDECTOMY  1975  . BREAST BIOPSY     benign  . BREAST EXCISIONAL BIOPSY Left 1957  . CARDIOVERSION N/A 03/07/2016   Procedure: CARDIOVERSION;  Surgeon: Adrian Prows, MD;  Location: [place];  Service: Cardiovascular;  Laterality: N/A;  . CATARACT EXTRACTION     bilateral  . CHOLECYSTECTOMY    . COLONOSCOPY  1998   Diverticulosis; polyp\  . COLONOSCOPY  09/2002   Diverticulosis, hem  . COLONOSCOPY  12/2007   diverticulosis, polyp  . DEXA  10/2001   osteopenia  . ELECTROPHYSIOLOGIC STUDY N/A 04/03/2016   Procedure: A-Flutter Ablation;  Surgeon: Will Meredith Leeds, MD;  Location: [place] [facility];  Service: Cardiovascular;  Laterality: N/A;  .  ESOPHAGOGASTRODUODENOSCOPY  2004  . Hida scan  01/2001   Negative  . KNEE SURGERY     right knee / 11/2004  . LAMINECTOMY WITH POSTERIOR LATERAL ARTHRODESIS LEVEL 2 N/A 07/31/2017   Procedure: DECOMPRESSIVE LAMINECTOMY LUMABR FOUR-FIVE, LUMBAR FIVE-SACRAL ONE;  Surgeon: Eustace Moore, MD;  Location: [place];  Service: Neurosurgery;  Laterality: N/A;  . laser surgery for glaucoma Left Sep 21, 2014  . MULTIPLE TOOTH EXTRACTIONS    . ROTATOR CUFF REPAIR     x 2 /right shoulder  . SKIN CANCER EXCISION     pre-melanoma / on face  . TEE WITHOUT CARDIOVERSION N/A 03/07/2016   Procedure: TRANSESOPHAGEAL ECHOCARDIOGRAM (TEE);  Surgeon: Adrian Prows, MD;  Location: [place];  Service: Cardiovascular;  Laterality: N/A;  . TOE SURGERY     rt foot     There were no vitals filed for this visit.   Subjective Assessment - 10/19/20 1610    Subjective My right knee is hurting today    Currently in Pain? Yes    Pain Score 5     Pain Location Back   right knee             [facility] PT Assessment - 10/19/20 0001

## 2020-10-24 ENCOUNTER — Encounter: Payer: Self-pay | Admitting: Physical Therapy

## 2020-10-24 ENCOUNTER — Other Ambulatory Visit: Payer: Self-pay

## 2020-10-24 ENCOUNTER — Ambulatory Visit: Payer: Medicare Other | Attending: Family Medicine | Admitting: Physical Therapy

## 2020-10-24 DIAGNOSIS — M542 Cervicalgia: Secondary | ICD-10-CM | POA: Diagnosis not present

## 2020-10-24 DIAGNOSIS — M545 Low back pain, unspecified: Secondary | ICD-10-CM | POA: Insufficient documentation

## 2020-10-24 DIAGNOSIS — M62830 Muscle spasm of back: Secondary | ICD-10-CM | POA: Diagnosis not present

## 2020-10-24 DIAGNOSIS — R262 Difficulty in walking, not elsewhere classified: Secondary | ICD-10-CM | POA: Diagnosis not present

## 2020-10-24 NOTE — Therapy (Signed)
flutter 03/06/2016  . Obesity 11/06/2015  . Urge incontinence 01/24/2015  . Estrogen deficiency 10/25/2014  . Lichen planus 12/20/2013  . Encounter for Medicare annual wellness exam 06/17/2013  . Screening mammogram, encounter for 06/15/2012  . Prediabetes 06/03/2011  . HYPERTENSION, BENIGN ESSENTIAL 10/23/2010  . OSTEOARTHRITIS, HANDS, BILATERAL 01/24/2010  . Hypothyroidism 08/24/2008  . Vitamin D deficiency 06/06/2008  . Hyperlipidemia 06/06/2008  . MENOPAUSAL SYNDROME 03/29/2008  . Osteopenia 03/29/2008  . ALLERGIC RHINITIS 01/25/2008  . IBS 01/25/2008  . OSTEOARTHRITIS 01/25/2008  . FIBROMYALGIA 01/25/2008    , W., PT 10/24/2020, 3:16 PM  [facility] [address] [place], [place], [address] Phone: [phone]   Fax:  [phone]  Name: Linda Mcgrath MRN: [id number] Date of Birth: 11/06/1932     [facility] [address] [place], [place], [address] Phone: [phone]   Fax:  [phone]  Physical Therapy Treatment  Patient Details  Name: Linda Mcgrath MRN: [id number] Date of Birth: 06/25/1933 Referring Provider (PT): [name]   Encounter Date: 10/24/2020   PT End of Session - 10/24/20 1513    Visit Number 12    Date for PT Re-Evaluation 11/05/20    PT Start Time 1440    PT Stop Time 1525    PT Time Calculation (min) 45 min    Activity Tolerance Patient tolerated treatment well    Behavior During Therapy [facility] for tasks assessed/performed           Past Medical History:  Diagnosis Date  . Allergic rhinitis   . Arthritis   . Asthma   . Cataract    Bil/lens implant  . Colon polyp 1999   small polyp  . Diverticulosis   . Dysrhythmia   . Fibromyalgia   . Hyperlipidemia   . Hypothyroidism   . Internal hemorrhoids   . Leg cramps   . Lichen planus   . Lung nodule    stable LUL 9 mm  . Menopausal syndrome   . Osteoarthritis   . UTI (urinary tract infection)     Past Surgical History:  Procedure Laterality Date  . Abd U/S  11/1998   negative  . Abd U/S  01/2001   gallbladder polyps  . ABDOMINAL HYSTERECTOMY  1975   total-fibroids  . APPENDECTOMY  1975  . BREAST BIOPSY     benign  . BREAST EXCISIONAL BIOPSY Left 1957  . CARDIOVERSION N/A 03/07/2016   Procedure: CARDIOVERSION;  Surgeon: Jay Ganji, MD;  [facility];  Service: Cardiovascular;  Laterality: N/A;  . CATARACT EXTRACTION     bilateral  . CHOLECYSTECTOMY    . COLONOSCOPY  1998   Diverticulosis; polyp\  . COLONOSCOPY  09/2002   Diverticulosis, hem  . COLONOSCOPY  12/2007   diverticulosis, polyp  . DEXA  10/2001   osteopenia  . ELECTROPHYSIOLOGIC STUDY N/A 04/03/2016   Procedure: A-Flutter Ablation;  Surgeon: Will Martin Camnitz, MD;  [facility];  Service: Cardiovascular;  Laterality: N/A;  .  ESOPHAGOGASTRODUODENOSCOPY  2004  . Hida scan  01/2001   Negative  . KNEE SURGERY     right knee / 11/2004  . LAMINECTOMY WITH POSTERIOR LATERAL ARTHRODESIS LEVEL 2 N/A 07/31/2017   Procedure: DECOMPRESSIVE LAMINECTOMY LUMABR FOUR-FIVE, LUMBAR FIVE-SACRAL ONE;  Surgeon: Jones, David S, MD;  [facility];  Service: Neurosurgery;  Laterality: N/A;  . laser surgery for glaucoma Left Sep 21, 2014  . MULTIPLE TOOTH EXTRACTIONS    . ROTATOR CUFF REPAIR     x 2 /right shoulder  . SKIN CANCER EXCISION     pre-melanoma / on face  . TEE WITHOUT CARDIOVERSION N/A 03/07/2016   Procedure: TRANSESOPHAGEAL ECHOCARDIOGRAM (TEE);  Surgeon: Jay Ganji, MD;  [facility];  Service: Cardiovascular;  Laterality: N/A;  . TOE SURGERY     rt foot     There were no vitals filed for this visit.   Subjective Assessment - 10/24/20 1444    Subjective My legs are a little swollen and stiff, back is still hurting    Currently in Pain? Yes    Pain Score 4     Pain Location Back    Pain Orientation Mid;Upper    Aggravating Factors  weather                               [facility] [address] [place], [place], [address] Phone: [phone]   Fax:  [phone]  Physical Therapy Treatment  Patient Details  Name: Linda Mcgrath MRN: [id number] Date of Birth: 06/25/1933 Referring Provider (PT): [name]   Encounter Date: 10/24/2020   PT End of Session - 10/24/20 1513    Visit Number 12    Date for PT Re-Evaluation 11/05/20    PT Start Time 1440    PT Stop Time 1525    PT Time Calculation (min) 45 min    Activity Tolerance Patient tolerated treatment well    Behavior During Therapy [facility] for tasks assessed/performed           Past Medical History:  Diagnosis Date  . Allergic rhinitis   . Arthritis   . Asthma   . Cataract    Bil/lens implant  . Colon polyp 1999   small polyp  . Diverticulosis   . Dysrhythmia   . Fibromyalgia   . Hyperlipidemia   . Hypothyroidism   . Internal hemorrhoids   . Leg cramps   . Lichen planus   . Lung nodule    stable LUL 9 mm  . Menopausal syndrome   . Osteoarthritis   . UTI (urinary tract infection)     Past Surgical History:  Procedure Laterality Date  . Abd U/S  11/1998   negative  . Abd U/S  01/2001   gallbladder polyps  . ABDOMINAL HYSTERECTOMY  1975   total-fibroids  . APPENDECTOMY  1975  . BREAST BIOPSY     benign  . BREAST EXCISIONAL BIOPSY Left 1957  . CARDIOVERSION N/A 03/07/2016   Procedure: CARDIOVERSION;  Surgeon: Jay Ganji, MD;  [facility];  Service: Cardiovascular;  Laterality: N/A;  . CATARACT EXTRACTION     bilateral  . CHOLECYSTECTOMY    . COLONOSCOPY  1998   Diverticulosis; polyp\  . COLONOSCOPY  09/2002   Diverticulosis, hem  . COLONOSCOPY  12/2007   diverticulosis, polyp  . DEXA  10/2001   osteopenia  . ELECTROPHYSIOLOGIC STUDY N/A 04/03/2016   Procedure: A-Flutter Ablation;  Surgeon: Will Martin Camnitz, MD;  [facility];  Service: Cardiovascular;  Laterality: N/A;  .  ESOPHAGOGASTRODUODENOSCOPY  2004  . Hida scan  01/2001   Negative  . KNEE SURGERY     right knee / 11/2004  . LAMINECTOMY WITH POSTERIOR LATERAL ARTHRODESIS LEVEL 2 N/A 07/31/2017   Procedure: DECOMPRESSIVE LAMINECTOMY LUMABR FOUR-FIVE, LUMBAR FIVE-SACRAL ONE;  Surgeon: Jones, David S, MD;  [facility];  Service: Neurosurgery;  Laterality: N/A;  . laser surgery for glaucoma Left Sep 21, 2014  . MULTIPLE TOOTH EXTRACTIONS    . ROTATOR CUFF REPAIR     x 2 /right shoulder  . SKIN CANCER EXCISION     pre-melanoma / on face  . TEE WITHOUT CARDIOVERSION N/A 03/07/2016   Procedure: TRANSESOPHAGEAL ECHOCARDIOGRAM (TEE);  Surgeon: Jay Ganji, MD;  [facility];  Service: Cardiovascular;  Laterality: N/A;  . TOE SURGERY     rt foot     There were no vitals filed for this visit.   Subjective Assessment - 10/24/20 1444    Subjective My legs are a little swollen and stiff, back is still hurting    Currently in Pain? Yes    Pain Score 4     Pain Location Back    Pain Orientation Mid;Upper    Aggravating Factors  weather                            

## 2020-10-26 ENCOUNTER — Encounter: Payer: Self-pay | Admitting: Physical Therapy

## 2020-10-26 ENCOUNTER — Other Ambulatory Visit: Payer: Self-pay

## 2020-10-26 ENCOUNTER — Ambulatory Visit: Payer: Medicare Other | Admitting: Physical Therapy

## 2020-10-26 DIAGNOSIS — M545 Low back pain, unspecified: Secondary | ICD-10-CM

## 2020-10-26 DIAGNOSIS — R262 Difficulty in walking, not elsewhere classified: Secondary | ICD-10-CM

## 2020-10-26 DIAGNOSIS — M542 Cervicalgia: Secondary | ICD-10-CM | POA: Diagnosis not present

## 2020-10-26 DIAGNOSIS — M62830 Muscle spasm of back: Secondary | ICD-10-CM | POA: Diagnosis not present

## 2020-10-26 NOTE — Therapy (Signed)
[id number] Phone: [phone]   Fax:  [phone]  Name: Linda Mcgrath MRN: [id number] Date of Birth: 1932/12/07  [address] Phone: [phone]   Fax:  [phone]  Name: Linda Mcgrath MRN: [id number] Date of Birth: 1932/12/07  [place]. [place], [place], [address] Phone: [phone]   Fax:  [phone]  Physical Therapy Treatment  Patient Details  Name: Linda Mcgrath MRN: [id number] Date of Birth: 11/29/1932 Referring Provider (PT): [name]   Encounter Date: 10/26/2020   PT End of Session - 10/26/20 1516    Visit Number 13    Date for PT Re-Evaluation 11/05/20    PT Start Time 1440    PT Stop Time 1524    PT Time Calculation (min) 44 min    Activity Tolerance Patient tolerated treatment well    Behavior During Therapy [facility] for tasks assessed/performed           Past Medical History:  Diagnosis Date  . Allergic rhinitis   . Arthritis   . Asthma   . Cataract    Bil/lens implant  . Colon polyp 1999   small polyp  . Diverticulosis   . Dysrhythmia   . Fibromyalgia   . Hyperlipidemia   . Hypothyroidism   . Internal hemorrhoids   . Leg cramps   . Lichen planus   . Lung nodule    stable LUL 9 mm  . Menopausal syndrome   . Osteoarthritis   . UTI (urinary tract infection)     Past Surgical History:  Procedure Laterality Date  . Abd U/S  11/1998   negative  . Abd U/S  01/2001   gallbladder polyps  . ABDOMINAL HYSTERECTOMY  1975   total-fibroids  . APPENDECTOMY  1975  . BREAST BIOPSY     benign  . BREAST EXCISIONAL BIOPSY Left 1957  . CARDIOVERSION N/A 03/07/2016   Procedure: CARDIOVERSION;  Surgeon: Adrian Prows, MD;  Location: [place];  Service: Cardiovascular;  Laterality: N/A;  . CATARACT EXTRACTION     bilateral  . CHOLECYSTECTOMY    . COLONOSCOPY  1998   Diverticulosis; polyp\  . COLONOSCOPY  09/2002   Diverticulosis, hem  . COLONOSCOPY  12/2007   diverticulosis, polyp  . DEXA  10/2001   osteopenia  . ELECTROPHYSIOLOGIC STUDY N/A 04/03/2016   Procedure: A-Flutter Ablation;  Surgeon: Will Meredith Leeds, MD;  Location: [facility] [facility];  Service: Cardiovascular;  Laterality: N/A;  .  ESOPHAGOGASTRODUODENOSCOPY  2004  . Hida scan  01/2001   Negative  . KNEE SURGERY     right knee / 11/2004  . LAMINECTOMY WITH POSTERIOR LATERAL ARTHRODESIS LEVEL 2 N/A 07/31/2017   Procedure: DECOMPRESSIVE LAMINECTOMY LUMABR FOUR-FIVE, LUMBAR FIVE-SACRAL ONE;  Surgeon: Eustace Moore, MD;  Location: [facility] [place];  Service: Neurosurgery;  Laterality: N/A;  . laser surgery for glaucoma Left Sep 21, 2014  . MULTIPLE TOOTH EXTRACTIONS    . ROTATOR CUFF REPAIR     x 2 /right shoulder  . SKIN CANCER EXCISION     pre-melanoma / on face  . TEE WITHOUT CARDIOVERSION N/A 03/07/2016   Procedure: TRANSESOPHAGEAL ECHOCARDIOGRAM (TEE);  Surgeon: Adrian Prows, MD;  Location: [place];  Service: Cardiovascular;  Laterality: N/A;  . TOE SURGERY     rt foot     There were no vitals filed for this visit.   Subjective Assessment - 10/26/20 1446    Subjective Patient reports that she is feeling a little better    Currently in Pain? Yes    Pain Score 3     Pain Location Back

## 2020-11-07 ENCOUNTER — Encounter: Payer: Self-pay | Admitting: Physical Therapy

## 2020-11-07 ENCOUNTER — Ambulatory Visit: Payer: Medicare Other | Admitting: Physical Therapy

## 2020-11-07 ENCOUNTER — Other Ambulatory Visit: Payer: Self-pay

## 2020-11-07 DIAGNOSIS — R262 Difficulty in walking, not elsewhere classified: Secondary | ICD-10-CM | POA: Diagnosis not present

## 2020-11-07 DIAGNOSIS — M542 Cervicalgia: Secondary | ICD-10-CM | POA: Diagnosis not present

## 2020-11-07 DIAGNOSIS — M62830 Muscle spasm of back: Secondary | ICD-10-CM | POA: Diagnosis not present

## 2020-11-07 DIAGNOSIS — M545 Low back pain, unspecified: Secondary | ICD-10-CM | POA: Diagnosis not present

## 2020-11-07 NOTE — Therapy (Signed)
[facility]  [address]. [place], [place], [address] Phone: [phone]   Fax:  [phone]  Name: Linda Mcgrath MRN: [id number] Date of Birth: [birth year]  [facility]  [place]. [place], [place], [address] Phone: [phone]   Fax:  [phone]  Name: Linda Mcgrath MRN: [id number] Date of Birth: [birth year]  [place]. [place], [place], [address] Phone: [phone]   Fax:  [phone]  Physical Therapy Treatment  Patient Details  Name: Linda Mcgrath MRN: [id number] Date of Birth: June 30, 1933 Referring Provider (PT): [name]   Encounter Date: 11/07/2020   PT End of Session - 11/07/20 1806    Visit Number 14    Date for PT Re-Evaluation 12/08/20    PT Start Time 5852    PT Stop Time 1732    PT Time Calculation (min) 42 min    Activity Tolerance Patient limited by pain    Behavior During Therapy [facility] for tasks assessed/performed           Past Medical History:  Diagnosis Date  . Allergic rhinitis   . Arthritis   . Asthma   . Cataract    Bil/lens implant  . Colon polyp 1999   small polyp  . Diverticulosis   . Dysrhythmia   . Fibromyalgia   . Hyperlipidemia   . Hypothyroidism   . Internal hemorrhoids   . Leg cramps   . Lichen planus   . Lung nodule    stable LUL 9 mm  . Menopausal syndrome   . Osteoarthritis   . UTI (urinary tract infection)     Past Surgical History:  Procedure Laterality Date  . Abd U/S  11/1998   negative  . Abd U/S  01/2001   gallbladder polyps  . ABDOMINAL HYSTERECTOMY  1975   total-fibroids  . APPENDECTOMY  1975  . BREAST BIOPSY     benign  . BREAST EXCISIONAL BIOPSY Left 1957  . CARDIOVERSION N/A 03/07/2016   Procedure: CARDIOVERSION;  Surgeon: Adrian Prows, MD;  Location: [place];  Service: Cardiovascular;  Laterality: N/A;  . CATARACT EXTRACTION     bilateral  . CHOLECYSTECTOMY    . COLONOSCOPY  1998   Diverticulosis; polyp\  . COLONOSCOPY  09/2002   Diverticulosis, hem  . COLONOSCOPY  12/2007   diverticulosis, polyp  . DEXA  10/2001   osteopenia  . ELECTROPHYSIOLOGIC STUDY N/A 04/03/2016   Procedure: A-Flutter Ablation;  Surgeon: Will Meredith Leeds, MD;  Location: [name];  Service: Cardiovascular;  Laterality: N/A;  . ESOPHAGOGASTRODUODENOSCOPY   2004  . Hida scan  01/2001   Negative  . KNEE SURGERY     right knee / 11/2004  . LAMINECTOMY WITH POSTERIOR LATERAL ARTHRODESIS LEVEL 2 N/A 07/31/2017   Procedure: DECOMPRESSIVE LAMINECTOMY LUMABR FOUR-FIVE, LUMBAR FIVE-SACRAL ONE;  Surgeon: Eustace Moore, MD;  Location: [place];  Service: Neurosurgery;  Laterality: N/A;  . laser surgery for glaucoma Left Sep 21, 2014  . MULTIPLE TOOTH EXTRACTIONS    . ROTATOR CUFF REPAIR     x 2 /right shoulder  . SKIN CANCER EXCISION     pre-melanoma / on face  . TEE WITHOUT CARDIOVERSION N/A 03/07/2016   Procedure: TRANSESOPHAGEAL ECHOCARDIOGRAM (TEE);  Surgeon: Adrian Prows, MD;  Location: [place];  Service: Cardiovascular;  Laterality: N/A;  . TOE SURGERY     rt foot     There were no vitals filed for this visit.   Subjective Assessment - 11/07/20 1803    Subjective She reports that she has had increased pain over the past week, not sure of why, she reports that she has not been able to get out and do much, reports that her knee limits her walking    Currently in Pain? Yes    Pain Score  [place] [address]. [place], [place], [address] Phone: [phone]   Fax:  [phone]  Physical Therapy Treatment  Patient Details  Name: Linda Mcgrath MRN: [id number] Date of Birth: June 30, 1933 Referring Provider (PT): [name]   Encounter Date: 11/07/2020   PT End of Session - 11/07/20 1806    Visit Number 14    Date for PT Re-Evaluation 12/08/20    PT Start Time 5852    PT Stop Time 1732    PT Time Calculation (min) 42 min    Activity Tolerance Patient limited by pain    Behavior During Therapy [facility] for tasks assessed/performed           Past Medical History:  Diagnosis Date  . Allergic rhinitis   . Arthritis   . Asthma   . Cataract    Bil/lens implant  . Colon polyp 1999   small polyp  . Diverticulosis   . Dysrhythmia   . Fibromyalgia   . Hyperlipidemia   . Hypothyroidism   . Internal hemorrhoids   . Leg cramps   . Lichen planus   . Lung nodule    stable LUL 9 mm  . Menopausal syndrome   . Osteoarthritis   . UTI (urinary tract infection)     Past Surgical History:  Procedure Laterality Date  . Abd U/S  11/1998   negative  . Abd U/S  01/2001   gallbladder polyps  . ABDOMINAL HYSTERECTOMY  1975   total-fibroids  . APPENDECTOMY  1975  . BREAST BIOPSY     benign  . BREAST EXCISIONAL BIOPSY Left 1957  . CARDIOVERSION N/A 03/07/2016   Procedure: CARDIOVERSION;  Surgeon: Adrian Prows, MD;  Location: [place];  Service: Cardiovascular;  Laterality: N/A;  . CATARACT EXTRACTION     bilateral  . CHOLECYSTECTOMY    . COLONOSCOPY  1998   Diverticulosis; polyp\  . COLONOSCOPY  09/2002   Diverticulosis, hem  . COLONOSCOPY  12/2007   diverticulosis, polyp  . DEXA  10/2001   osteopenia  . ELECTROPHYSIOLOGIC STUDY N/A 04/03/2016   Procedure: A-Flutter Ablation;  Surgeon: Will Meredith Leeds, MD;  Location: [name] [facility];  Service: Cardiovascular;  Laterality: N/A;  . ESOPHAGOGASTRODUODENOSCOPY   2004  . Hida scan  01/2001   Negative  . KNEE SURGERY     right knee / 11/2004  . LAMINECTOMY WITH POSTERIOR LATERAL ARTHRODESIS LEVEL 2 N/A 07/31/2017   Procedure: DECOMPRESSIVE LAMINECTOMY LUMABR FOUR-FIVE, LUMBAR FIVE-SACRAL ONE;  Surgeon: Eustace Moore, MD;  Location: [place];  Service: Neurosurgery;  Laterality: N/A;  . laser surgery for glaucoma Left Sep 21, 2014  . MULTIPLE TOOTH EXTRACTIONS    . ROTATOR CUFF REPAIR     x 2 /right shoulder  . SKIN CANCER EXCISION     pre-melanoma / on face  . TEE WITHOUT CARDIOVERSION N/A 03/07/2016   Procedure: TRANSESOPHAGEAL ECHOCARDIOGRAM (TEE);  Surgeon: Adrian Prows, MD;  Location: [place];  Service: Cardiovascular;  Laterality: N/A;  . TOE SURGERY     rt foot     There were no vitals filed for this visit.   Subjective Assessment - 11/07/20 1803    Subjective She reports that she has had increased pain over the past week, not sure of why, she reports that she has not been able to get out and do much, reports that her knee limits her walking    Currently in Pain? Yes    Pain Score

## 2020-11-09 ENCOUNTER — Ambulatory Visit: Payer: Medicare Other | Admitting: Physical Therapy

## 2020-11-09 ENCOUNTER — Other Ambulatory Visit: Payer: Self-pay

## 2020-11-09 ENCOUNTER — Encounter: Payer: Self-pay | Admitting: Physical Therapy

## 2020-11-09 DIAGNOSIS — M545 Low back pain, unspecified: Secondary | ICD-10-CM | POA: Diagnosis not present

## 2020-11-09 DIAGNOSIS — M62830 Muscle spasm of back: Secondary | ICD-10-CM | POA: Diagnosis not present

## 2020-11-09 DIAGNOSIS — M542 Cervicalgia: Secondary | ICD-10-CM | POA: Diagnosis not present

## 2020-11-09 DIAGNOSIS — R262 Difficulty in walking, not elsewhere classified: Secondary | ICD-10-CM

## 2020-11-09 NOTE — Therapy (Signed)
elsewhere classified  Cervicalgia     Problem List Patient Active Problem List   Diagnosis Date Noted  . Chronic upper back pain 08/30/2020  . Aortic atherosclerosis ([place]) 05/31/2020  . Abnormal CT scan, small bowel 05/18/2020  . Generalized abdominal pain 05/17/2020  . Duodenitis 03/13/2020  . Right lateral abdominal pain 03/07/2020  . Insomnia 12/15/2019  . Chronic low back pain 10/18/2019  . Positive depression screening 10/18/2019  . Osteoarthritis of right knee 08/02/2019  . Diarrhea 07/03/2018  . Dyspepsia 07/03/2018  . S/P lumbar spinal fusion 07/31/2017  . Right low back pain 03/04/2017  . H/O atrial flutter 03/06/2016  . Obesity 11/06/2015  . Urge incontinence 01/24/2015  . Estrogen deficiency 10/25/2014  . Lichen planus 71/58/0638  . Encounter for Medicare annual wellness exam 06/17/2013  . Screening mammogram, encounter for 06/15/2012  . Prediabetes 06/03/2011  . HYPERTENSION, BENIGN ESSENTIAL 10/23/2010  . OSTEOARTHRITIS, HANDS, BILATERAL 01/24/2010  . Hypothyroidism 08/24/2008  . Vitamin D deficiency 06/06/2008  . Hyperlipidemia 06/06/2008  . MENOPAUSAL SYNDROME 03/29/2008  . Osteopenia 03/29/2008  . ALLERGIC RHINITIS 01/25/2008  . IBS 01/25/2008  . OSTEOARTHRITIS 01/25/2008  . FIBROMYALGIA 01/25/2008    [name], PT 11/09/2020, 5:15 PM  [address] [place], [place], [address] Phone: [phone]   Fax:  [phone]  Name: Linda Mcgrath MRN: [id number] Date of Birth: [birth year]  elsewhere classified  Cervicalgia     Problem List Patient Active Problem List   Diagnosis Date Noted  . Chronic upper back pain 08/30/2020  . Aortic atherosclerosis ([place]) 05/31/2020  . Abnormal CT scan, small bowel 05/18/2020  . Generalized abdominal pain 05/17/2020  . Duodenitis 03/13/2020  . Right lateral abdominal pain 03/07/2020  . Insomnia 12/15/2019  . Chronic low back pain 10/18/2019  . Positive depression screening 10/18/2019  . Osteoarthritis of right knee 08/02/2019  . Diarrhea 07/03/2018  . Dyspepsia 07/03/2018  . S/P lumbar spinal fusion 07/31/2017  . Right low back pain 03/04/2017  . H/O atrial flutter 03/06/2016  . Obesity 11/06/2015  . Urge incontinence 01/24/2015  . Estrogen deficiency 10/25/2014  . Lichen planus 71/58/0638  . Encounter for Medicare annual wellness exam 06/17/2013  . Screening mammogram, encounter for 06/15/2012  . Prediabetes 06/03/2011  . HYPERTENSION, BENIGN ESSENTIAL 10/23/2010  . OSTEOARTHRITIS, HANDS, BILATERAL 01/24/2010  . Hypothyroidism 08/24/2008  . Vitamin D deficiency 06/06/2008  . Hyperlipidemia 06/06/2008  . MENOPAUSAL SYNDROME 03/29/2008  . Osteopenia 03/29/2008  . ALLERGIC RHINITIS 01/25/2008  . IBS 01/25/2008  . OSTEOARTHRITIS 01/25/2008  . FIBROMYALGIA 01/25/2008    [name], PT 11/09/2020, 5:15 PM  [address] [place], [place], [address] Phone: [phone]   Fax:  [phone]  Name: Linda Mcgrath MRN: [id number] Date of Birth: [birth year]  [address] [place], [place], [address] Phone: [phone]   Fax:  [phone]  Physical Therapy Treatment  Patient Details  Name: Linda Mcgrath MRN: [id number] Date of Birth: 21-Feb-1933 Referring Provider (PT): [name]   Encounter Date: 11/09/2020   PT End of Session - 11/09/20 1712    Visit Number 15    Date for PT Re-Evaluation 12/08/20    PT Start Time 1520    PT Stop Time 1604    PT Time Calculation (min) 44 min    Activity Tolerance Patient tolerated treatment well    Behavior During Therapy [facility] for tasks assessed/performed           Past Medical History:  Diagnosis Date  . Allergic rhinitis   . Arthritis   . Asthma   . Cataract    Bil/lens implant  . Colon polyp 1999   small polyp  . Diverticulosis   . Dysrhythmia   . Fibromyalgia   . Hyperlipidemia   . Hypothyroidism   . Internal hemorrhoids   . Leg cramps   . Lichen planus   . Lung nodule    stable LUL 9 mm  . Menopausal syndrome   . Osteoarthritis   . UTI (urinary tract infection)     Past Surgical History:  Procedure Laterality Date  . Abd U/S  11/1998   negative  . Abd U/S  01/2001   gallbladder polyps  . ABDOMINAL HYSTERECTOMY  1975   total-fibroids  . APPENDECTOMY  1975  . BREAST BIOPSY     benign  . BREAST EXCISIONAL BIOPSY Left 1957  . CARDIOVERSION N/A 03/07/2016   Procedure: CARDIOVERSION;  Surgeon: Adrian Prows, MD;  Location: [place];  Service: Cardiovascular;  Laterality: N/A;  . CATARACT EXTRACTION     bilateral  . CHOLECYSTECTOMY    . COLONOSCOPY  1998   Diverticulosis; polyp\  . COLONOSCOPY  09/2002   Diverticulosis, hem  . COLONOSCOPY  12/2007   diverticulosis, polyp  . DEXA  10/2001   osteopenia  . ELECTROPHYSIOLOGIC STUDY N/A 04/03/2016   Procedure: A-Flutter Ablation;  Surgeon: Will Meredith Leeds, MD;  Location: [place] [facility];  Service: Cardiovascular;  Laterality: N/A;  .  ESOPHAGOGASTRODUODENOSCOPY  2004  . [place] scan  01/2001   Negative  . KNEE SURGERY     right knee / 11/2004  . LAMINECTOMY WITH POSTERIOR LATERAL ARTHRODESIS LEVEL 2 N/A 07/31/2017   Procedure: DECOMPRESSIVE LAMINECTOMY LUMABR FOUR-FIVE, LUMBAR FIVE-SACRAL ONE;  Surgeon: Eustace Moore, MD;  Location: [facility];  Service: Neurosurgery;  Laterality: N/A;  . laser surgery for glaucoma Left Sep 21, 2014  . MULTIPLE TOOTH EXTRACTIONS    . ROTATOR CUFF REPAIR     x 2 /right shoulder  . SKIN CANCER EXCISION     pre-melanoma / on face  . TEE WITHOUT CARDIOVERSION N/A 03/07/2016   Procedure: TRANSESOPHAGEAL ECHOCARDIOGRAM (TEE);  Surgeon: Adrian Prows, MD;  Location: [place];  Service: Cardiovascular;  Laterality: N/A;  . TOE SURGERY     rt foot     There were no vitals filed for this visit.   Subjective Assessment - 11/09/20 1527    Subjective Back and knees are hurting a little more today.    Currently in Pain? Yes    Pain Score 5     Pain Location Back    Aggravating Factors  walking

## 2020-11-14 ENCOUNTER — Other Ambulatory Visit: Payer: Self-pay

## 2020-11-14 ENCOUNTER — Ambulatory Visit: Payer: Medicare Other | Admitting: Physical Therapy

## 2020-11-14 ENCOUNTER — Encounter: Payer: Self-pay | Admitting: Physical Therapy

## 2020-11-14 DIAGNOSIS — R262 Difficulty in walking, not elsewhere classified: Secondary | ICD-10-CM | POA: Diagnosis not present

## 2020-11-14 DIAGNOSIS — M545 Low back pain, unspecified: Secondary | ICD-10-CM

## 2020-11-14 DIAGNOSIS — M62830 Muscle spasm of back: Secondary | ICD-10-CM

## 2020-11-14 DIAGNOSIS — M542 Cervicalgia: Secondary | ICD-10-CM

## 2020-11-14 NOTE — Therapy (Signed)
back  pain 08/30/2020  . Aortic atherosclerosis ([place]) 05/31/2020  . Abnormal CT scan, small bowel 05/18/2020  . Generalized abdominal pain 05/17/2020  . Duodenitis 03/13/2020  . Right lateral abdominal pain 03/07/2020  . Insomnia 12/15/2019  . Chronic low back pain 10/18/2019  . Positive depression screening 10/18/2019  . Osteoarthritis of right knee 08/02/2019  . Diarrhea 07/03/2018  . Dyspepsia 07/03/2018  . S/P lumbar spinal fusion 07/31/2017  . Right low back pain 03/04/2017  . H/O atrial flutter 03/06/2016  . Obesity 11/06/2015  . Urge incontinence 01/24/2015  . Estrogen deficiency 10/25/2014  . Lichen planus 97/91/5041  . Encounter for Medicare annual wellness exam 06/17/2013  . Screening mammogram, encounter for 06/15/2012  . Prediabetes 06/03/2011  . HYPERTENSION, BENIGN ESSENTIAL 10/23/2010  . OSTEOARTHRITIS, HANDS, BILATERAL 01/24/2010  . Hypothyroidism 08/24/2008  . Vitamin D deficiency 06/06/2008  . Hyperlipidemia 06/06/2008  . MENOPAUSAL SYNDROME 03/29/2008  . Osteopenia 03/29/2008  . ALLERGIC RHINITIS 01/25/2008  . IBS 01/25/2008  . OSTEOARTHRITIS 01/25/2008  . FIBROMYALGIA 01/25/2008    [name], PT 11/14/2020, 5:46 PM  [address] [place], [place], [address] Phone: [phone]   Fax:  [phone]  Name: Linda Mcgrath MRN: [id number] Date of Birth: March 13, 1933  [address] [place], [place], [address] Phone: [phone]   Fax:  [phone]  Physical Therapy Treatment  Patient Details  Name: Linda Mcgrath MRN: [id number] Date of Birth: [birth year] Referring Provider (PT): [name]   Encounter Date: 11/14/2020   PT End of Session - 11/14/20 4332    Visit Number 16    Date for PT Re-Evaluation 12/08/20    PT Start Time 1654    PT Stop Time 1741    PT Time Calculation (min) 47 min    Activity Tolerance Patient tolerated treatment well    Behavior During Therapy [facility] for tasks assessed/performed           Past Medical History:  Diagnosis Date  . Allergic rhinitis   . Arthritis   . Asthma   . Cataract    Bil/lens implant  . Colon polyp 1999   small polyp  . Diverticulosis   . Dysrhythmia   . Fibromyalgia   . Hyperlipidemia   . Hypothyroidism   . Internal hemorrhoids   . Leg cramps   . Lichen planus   . Lung nodule    stable LUL 9 mm  . Menopausal syndrome   . Osteoarthritis   . UTI (urinary tract infection)     Past Surgical History:  Procedure Laterality Date  . Abd U/S  11/1998   negative  . Abd U/S  01/2001   gallbladder polyps  . ABDOMINAL HYSTERECTOMY  1975   total-fibroids  . APPENDECTOMY  1975  . BREAST BIOPSY     benign  . BREAST EXCISIONAL BIOPSY Left 1957  . CARDIOVERSION N/A 03/07/2016   Procedure: CARDIOVERSION;  Surgeon: Adrian Prows, MD;  Location: [place];  Service: Cardiovascular;  Laterality: N/A;  . CATARACT EXTRACTION     bilateral  . CHOLECYSTECTOMY    . COLONOSCOPY  1998   Diverticulosis; polyp\  . COLONOSCOPY  09/2002   Diverticulosis, hem  . COLONOSCOPY  12/2007   diverticulosis, polyp  . DEXA  10/2001   osteopenia  . ELECTROPHYSIOLOGIC STUDY N/A 04/03/2016   Procedure: A-Flutter Ablation;  Surgeon: Will Meredith Leeds, MD;  Location: [place] CV [facility];  Service: Cardiovascular;  Laterality: N/A;  .  ESOPHAGOGASTRODUODENOSCOPY  2004  . [place] scan  01/2001   Negative  . KNEE SURGERY     right knee / 11/2004  . LAMINECTOMY WITH POSTERIOR LATERAL ARTHRODESIS LEVEL 2 N/A 07/31/2017   Procedure: DECOMPRESSIVE LAMINECTOMY LUMABR FOUR-FIVE, LUMBAR FIVE-SACRAL ONE;  Surgeon: Eustace Moore, MD;  Location: [place];  Service: Neurosurgery;  Laterality: N/A;  . laser surgery for glaucoma Left Sep 21, 2014  . MULTIPLE TOOTH EXTRACTIONS    . ROTATOR CUFF REPAIR     x 2 /right shoulder  . SKIN CANCER EXCISION     pre-melanoma / on face  . TEE WITHOUT CARDIOVERSION N/A 03/07/2016   Procedure: TRANSESOPHAGEAL ECHOCARDIOGRAM (TEE);  Surgeon: Adrian Prows, MD;  Location: [place];  Service: Cardiovascular;  Laterality: N/A;  . TOE SURGERY     rt foot     There were no vitals filed for this visit.   Subjective Assessment - 11/14/20 1653    Subjective Knee hurting again, she reports that she tried to be a little more active, reports doing multiple loads of laundry    Currently in Pain? Yes    Pain Score 5     Pain Location Back   right knee   Pain Relieving Factors "  back  pain 08/30/2020  . Aortic atherosclerosis ([place]) 05/31/2020  . Abnormal CT scan, small bowel 05/18/2020  . Generalized abdominal pain 05/17/2020  . Duodenitis 03/13/2020  . Right lateral abdominal pain 03/07/2020  . Insomnia 12/15/2019  . Chronic low back pain 10/18/2019  . Positive depression screening 10/18/2019  . Osteoarthritis of right knee 08/02/2019  . Diarrhea 07/03/2018  . Dyspepsia 07/03/2018  . S/P lumbar spinal fusion 07/31/2017  . Right low back pain 03/04/2017  . H/O atrial flutter 03/06/2016  . Obesity 11/06/2015  . Urge incontinence 01/24/2015  . Estrogen deficiency 10/25/2014  . Lichen planus 97/91/5041  . Encounter for Medicare annual wellness exam 06/17/2013  . Screening mammogram, encounter for 06/15/2012  . Prediabetes 06/03/2011  . HYPERTENSION, BENIGN ESSENTIAL 10/23/2010  . OSTEOARTHRITIS, HANDS, BILATERAL 01/24/2010  . Hypothyroidism 08/24/2008  . Vitamin D deficiency 06/06/2008  . Hyperlipidemia 06/06/2008  . MENOPAUSAL SYNDROME 03/29/2008  . Osteopenia 03/29/2008  . ALLERGIC RHINITIS 01/25/2008  . IBS 01/25/2008  . OSTEOARTHRITIS 01/25/2008  . FIBROMYALGIA 01/25/2008    [name], PT 11/14/2020, 5:46 PM  [address]. [place], [place], [address] Phone: [phone]   Fax:  [phone]  Name: Linda Mcgrath MRN: [id number] Date of Birth: March 13, 1933

## 2020-11-16 ENCOUNTER — Ambulatory Visit: Payer: Medicare Other | Admitting: Physical Therapy

## 2020-11-16 ENCOUNTER — Telehealth: Payer: Self-pay

## 2020-11-16 ENCOUNTER — Other Ambulatory Visit: Payer: Self-pay

## 2020-11-16 ENCOUNTER — Other Ambulatory Visit: Payer: Medicare Other

## 2020-11-16 ENCOUNTER — Other Ambulatory Visit: Payer: Self-pay | Admitting: Family Medicine

## 2020-11-16 ENCOUNTER — Telehealth (INDEPENDENT_AMBULATORY_CARE_PROVIDER_SITE_OTHER): Payer: Medicare Other | Admitting: Family Medicine

## 2020-11-16 VITALS — HR 74 | Temp 97.7°F

## 2020-11-16 DIAGNOSIS — Z20822 Contact with and (suspected) exposure to covid-19: Secondary | ICD-10-CM

## 2020-11-16 DIAGNOSIS — J040 Acute laryngitis: Secondary | ICD-10-CM

## 2020-11-16 DIAGNOSIS — J029 Acute pharyngitis, unspecified: Secondary | ICD-10-CM | POA: Diagnosis not present

## 2020-11-16 NOTE — Telephone Encounter (Signed)
Per appt notes pt has virtual phone call appt with DR Einar Pheasant 11/16/20 at 4:20.

## 2020-11-16 NOTE — Telephone Encounter (Signed)
I spoke with pt; pt said not too SOB and pt does not have CP. Pt is resting inside her home today and thinks that she can wait for phone appt this afternoon at 4:20. UC & ED precautions given and pt voiced understanding.

## 2020-11-16 NOTE — Telephone Encounter (Signed)
See phone note from today.

## 2020-11-16 NOTE — Telephone Encounter (Signed)
[facility] - Client TELEPHONE ADVICE RECORD [facility] Patient Name: Linda Mcgrath INS Gender: Female DOB: Nov 07, 1932 Age: 87 Y 11 M 16 D Return Phone Number: [phone] (Primary) Address: City/State/Zip: [place] [address] Client [facility] - Client Client Site White MD Contact Type Call Who Is Calling Patient / Member / Family / Caregiver Call Type Triage / Clinical Relationship To Patient Self Return Phone Number [phone] (Primary) Chief Complaint BREATHING - shortness of breath or sounds breathless Reason for Call Symptomatic / Request for [place] states she is experiencing shortness of breath and hoarseness. Translation No Nurse Assessment Nurse: Altamease Oiler, RN, Adriana Date/Time (Eastern Time): 11/16/2020 9:36:37 AM Confirm and document reason for call. If symptomatic, describe symptoms. ---pt states that she has been having pain to upper neck radiates to ear. also hoarseness. reports sob with talking. possibly d/t throat tightness. sx onset 1 week ago. also reports a productive cough and runny nose. fully vaccinated and boosted Does the patient have any new or worsening symptoms? ---Yes Will a triage be completed? ---Yes Related visit to physician within the last 2 weeks? ---No Does the PT have any chronic conditions? (i.e. diabetes, asthma, this includes High risk factors for pregnancy, etc.) ---Yes List chronic conditions. ---hypothyroid Is this a behavioral health or substance abuse call? ---No Guidelines Guideline Title Affirmed Question Affirmed Notes Nurse Date/Time (Eastern Time) COVID-19 - Diagnosed or Suspected MILD difficulty breathing (e.g., minimal/no SOB at rest, SOB with walking, pulse <100) Altamease Oiler, RN, Adriana 11/16/2020 9:43:25 AM Disp. Time Eilene Ghazi Time) Disposition Final User 11/16/2020  9:33:21 AM Send to Urgent Zannie Kehr 11/16/2020 9:53:27 AM Send To RN Personal Altamease Oiler, RN, [place] 11/16/2020 9:52:59 AM See HCP within 4 Hours (or PCP triage) Yes Altamease Oiler, RN, Adriana PLEASE NOTE: All timestamps contained within this report are represented as [place] Standard Time. CONFIDENTIALTY NOTICE: This fax transmission is intended only for the addressee. It contains information that is legally privileged, confidential or otherwise protected from use or disclosure. If you are not the intended recipient, you are strictly prohibited from reviewing, disclosing, copying using or disseminating any of this information or taking any action in reliance on or regarding this information. If you have received this fax in error, please notify us immediately by telephone so that we can arrange for its return to [place]. Phone: [phone], Toll-Free: [phone], Fax: [phone] Page: 2 of 2 Call Id: [id number] [place] Disagree/Comply Comply Caller Understands Yes PreDisposition Call Doctor Care Advice Given Per Guideline SEE HCP (OR PCP TRIAGE) WITHIN 4 HOURS: * IF OFFICE WILL BE OPEN: You need to be seen within the next 3 or 4 hours. Call your doctor (or NP/PA) now or as soon as the office opens. CALL BACK IF: * You become worse CARE ADVICE given per COVID-19 - DIAGNOSED OR SUSPECTED (Adult) guideline. Referrals Warm transfer to backline

## 2020-11-16 NOTE — Progress Notes (Signed)
Virtual Visit via Telephone Note  I connected with Linda Mcgrath on 11/16/20 at  4:20 PM EDT by telephone and verified that I am speaking with the correct person using two identifiers.   I discussed the limitations, risks, security and privacy concerns of performing an evaluation and management service by telephone and the availability of in person appointments. I also discussed with the patient that there may be a patient responsible charge related to this service. The patient expressed understanding and agreed to proceed.  Patient location: Home Provider Location: [place] Participants: Lesleigh Noe and Roberto Scales Linda Mcgrath   History of Present Illness: Chief Complaint  Patient presents with  . Sinusitis    Sinusitis This is a new problem. Episode onset: 11/11/2020. The problem has been gradually worsening since onset. There has been no fever. The pain is mild. Associated symptoms include congestion, coughing, ear pain (right), a hoarse voice and a sore throat (pain with swallowing). Pertinent negatives include no headaches, shortness of breath, sinus pressure or swollen glands. (No difficulty swallowing) Past treatments include oral decongestants (honey, claritin-D). The treatment provided mild relief.   Pt notes hx of asthma - though not on on chart. She does not have inhaler currently  Review of Systems  HENT: Positive for congestion, ear pain (right), hoarse voice and sore throat (pain with swallowing). Negative for sinus pressure.   Respiratory: Positive for cough and wheezing. Negative for shortness of breath.   Neurological: Negative for headaches.      Observations/Objective: Pulse 74   Temp 97.7 F (36.5 C)   LMP 08/26/1969   SpO2 95%   Phone visit:  Patient speaking in complete sentences Hoarse voice No distress Alert and oriented Normal mood, laughing occasionally  Assessment and Plan: Problem List Items Addressed This Visit    None   Visit Diagnoses    Sore throat    -  Primary   Laryngitis         Discussed likely viral laryngitis - tylenol and honey for symptom care.   Discussed OTC treatment for viral illness  Discussed this could be covid and as eligible for outpatient treatment would recommend testing.  Patient will come today for drive-up testing Instructed to isolate until the results come back     Follow Up Instructions:  Return if symptoms worsen or fail to improve.   I discussed the assessment and treatment plan with the patient. The patient was provided an opportunity to ask questions and all were answered. The patient agreed with the plan and demonstrated an understanding of the instructions.   The patient was advised to call back or seek an in-person evaluation if the symptoms worsen or if the condition fails to improve as anticipated.  I provided 12 minutes of non-face-to-face time during this encounter.  Lesleigh Noe, MD

## 2020-11-17 LAB — SARS-COV-2, NAA 2 DAY TAT

## 2020-11-17 LAB — NOVEL CORONAVIRUS, NAA: SARS-CoV-2, NAA: NOT DETECTED

## 2020-11-20 ENCOUNTER — Telehealth: Payer: Self-pay

## 2020-11-20 NOTE — Telephone Encounter (Signed)
Pt left v/m requesting cb with covid test results.

## 2020-11-20 NOTE — Telephone Encounter (Signed)
Spoke to pt and relayed test results, per Dr. Einar Pheasant.

## 2020-11-21 ENCOUNTER — Ambulatory Visit: Payer: Medicare Other | Admitting: Physical Therapy

## 2020-11-21 ENCOUNTER — Encounter: Payer: Self-pay | Admitting: Physical Therapy

## 2020-11-21 ENCOUNTER — Other Ambulatory Visit: Payer: Self-pay

## 2020-11-21 DIAGNOSIS — M62830 Muscle spasm of back: Secondary | ICD-10-CM

## 2020-11-21 DIAGNOSIS — M542 Cervicalgia: Secondary | ICD-10-CM | POA: Diagnosis not present

## 2020-11-21 DIAGNOSIS — M545 Low back pain, unspecified: Secondary | ICD-10-CM

## 2020-11-21 DIAGNOSIS — R262 Difficulty in walking, not elsewhere classified: Secondary | ICD-10-CM | POA: Diagnosis not present

## 2020-11-21 NOTE — Therapy (Signed)
[facility]. [place], [place], [address] Phone: [phone]   Fax:  [phone]  Physical Therapy Treatment  Patient Details  Name: Linda Mcgrath MRN: [id number] Date of Birth: [birth year] Referring Provider (PT): [name]   Encounter Date: 11/21/2020   PT End of Session - 11/21/20 1551    Visit Number 17    Date for PT Re-Evaluation 12/08/20    PT Start Time 1524    PT Stop Time 1611    PT Time Calculation (min) 47 min    Activity Tolerance Patient tolerated treatment well    Behavior During Therapy [facility] for tasks assessed/performed           Past Medical History:  Diagnosis Date  . Allergic rhinitis   . Arthritis   . Asthma   . Cataract    Bil/lens implant  . Colon polyp 1999   small polyp  . Diverticulosis   . Dysrhythmia   . Fibromyalgia   . Hyperlipidemia   . Hypothyroidism   . Internal hemorrhoids   . Leg cramps   . Lichen planus   . Lung nodule    stable LUL 9 mm  . Menopausal syndrome   . Osteoarthritis   . UTI (urinary tract infection)     Past Surgical History:  Procedure Laterality Date  . Abd U/S  11/1998   negative  . Abd U/S  01/2001   gallbladder polyps  . ABDOMINAL HYSTERECTOMY  1975   total-fibroids  . APPENDECTOMY  1975  . BREAST BIOPSY     benign  . BREAST EXCISIONAL BIOPSY Left 1957  . CARDIOVERSION N/A 03/07/2016   Procedure: CARDIOVERSION;  Surgeon: Adrian Prows, MD;  Location: [facility];  Service: Cardiovascular;  Laterality: N/A;  . CATARACT EXTRACTION     bilateral  . CHOLECYSTECTOMY    . COLONOSCOPY  1998   Diverticulosis; polyp\  . COLONOSCOPY  09/2002   Diverticulosis, hem  . COLONOSCOPY  12/2007   diverticulosis, polyp  . DEXA  10/2001   osteopenia  . ELECTROPHYSIOLOGIC STUDY N/A 04/03/2016   Procedure: A-Flutter Ablation;  Surgeon: Will Meredith Leeds, MD;  Location: [facility];  Service: Cardiovascular;  Laterality: N/A;  .  ESOPHAGOGASTRODUODENOSCOPY  2004  . Hida scan  01/2001   Negative  . KNEE SURGERY     right knee / 11/2004  . LAMINECTOMY WITH POSTERIOR LATERAL ARTHRODESIS LEVEL 2 N/A 07/31/2017   Procedure: DECOMPRESSIVE LAMINECTOMY LUMABR FOUR-FIVE, LUMBAR FIVE-SACRAL ONE;  Surgeon: Eustace Moore, MD;  Location: [facility];  Service: Neurosurgery;  Laterality: N/A;  . laser surgery for glaucoma Left Sep 21, 2014  . MULTIPLE TOOTH EXTRACTIONS    . ROTATOR CUFF REPAIR     x 2 /right shoulder  . SKIN CANCER EXCISION     pre-melanoma / on face  . TEE WITHOUT CARDIOVERSION N/A 03/07/2016   Procedure: TRANSESOPHAGEAL ECHOCARDIOGRAM (TEE);  Surgeon: Adrian Prows, MD;  Location: [facility];  Service: Cardiovascular;  Laterality: N/A;  . TOE SURGERY     rt foot     There were no vitals filed for this visit.   Subjective Assessment - 11/21/20 1528    Subjective Patient reports that she had to cancel last week due to laryngitis, I was told not to do anything until I tested negative for covide.  [facility]. [place], [place], [address] Phone: [phone]   Fax:  [phone]  Physical Therapy Treatment  Patient Details  Name: Linda Mcgrath MRN: [id number] Date of Birth: [birth year] Referring Provider (PT): [name]   Encounter Date: 11/21/2020   PT End of Session - 11/21/20 1551    Visit Number 17    Date for PT Re-Evaluation 12/08/20    PT Start Time 1524    PT Stop Time 1611    PT Time Calculation (min) 47 min    Activity Tolerance Patient tolerated treatment well    Behavior During Therapy [facility] for tasks assessed/performed           Past Medical History:  Diagnosis Date  . Allergic rhinitis   . Arthritis   . Asthma   . Cataract    Bil/lens implant  . Colon polyp 1999   small polyp  . Diverticulosis   . Dysrhythmia   . Fibromyalgia   . Hyperlipidemia   . Hypothyroidism   . Internal hemorrhoids   . Leg cramps   . Lichen planus   . Lung nodule    stable LUL 9 mm  . Menopausal syndrome   . Osteoarthritis   . UTI (urinary tract infection)     Past Surgical History:  Procedure Laterality Date  . Abd U/S  11/1998   negative  . Abd U/S  01/2001   gallbladder polyps  . ABDOMINAL HYSTERECTOMY  1975   total-fibroids  . APPENDECTOMY  1975  . BREAST BIOPSY     benign  . BREAST EXCISIONAL BIOPSY Left 1957  . CARDIOVERSION N/A 03/07/2016   Procedure: CARDIOVERSION;  Surgeon: Adrian Prows, MD;  Location: [facility];  Service: Cardiovascular;  Laterality: N/A;  . CATARACT EXTRACTION     bilateral  . CHOLECYSTECTOMY    . COLONOSCOPY  1998   Diverticulosis; polyp\  . COLONOSCOPY  09/2002   Diverticulosis, hem  . COLONOSCOPY  12/2007   diverticulosis, polyp  . DEXA  10/2001   osteopenia  . ELECTROPHYSIOLOGIC STUDY N/A 04/03/2016   Procedure: A-Flutter Ablation;  Surgeon: Will Meredith Leeds, MD;  Location: [facility];  Service: Cardiovascular;  Laterality: N/A;  .  ESOPHAGOGASTRODUODENOSCOPY  2004  . Hida scan  01/2001   Negative  . KNEE SURGERY     right knee / 11/2004  . LAMINECTOMY WITH POSTERIOR LATERAL ARTHRODESIS LEVEL 2 N/A 07/31/2017   Procedure: DECOMPRESSIVE LAMINECTOMY LUMABR FOUR-FIVE, LUMBAR FIVE-SACRAL ONE;  Surgeon: Eustace Moore, MD;  Location: [facility];  Service: Neurosurgery;  Laterality: N/A;  . laser surgery for glaucoma Left Sep 21, 2014  . MULTIPLE TOOTH EXTRACTIONS    . ROTATOR CUFF REPAIR     x 2 /right shoulder  . SKIN CANCER EXCISION     pre-melanoma / on face  . TEE WITHOUT CARDIOVERSION N/A 03/07/2016   Procedure: TRANSESOPHAGEAL ECHOCARDIOGRAM (TEE);  Surgeon: Adrian Prows, MD;  Location: [facility];  Service: Cardiovascular;  Laterality: N/A;  . TOE SURGERY     rt foot     There were no vitals filed for this visit.   Subjective Assessment - 11/21/20 1528    Subjective Patient reports that she had to cancel last week due to laryngitis, I was told not to do anything until I tested negative for covide.  Duodenitis 03/13/2020  . Right lateral abdominal pain 03/07/2020  . Insomnia 12/15/2019  . Chronic low back pain 10/18/2019  . Positive depression screening 10/18/2019  . Osteoarthritis of right knee 08/02/2019  . Diarrhea 07/03/2018  . Dyspepsia 07/03/2018  . S/P lumbar spinal fusion 07/31/2017  . Right low back pain 03/04/2017  . H/O atrial flutter 03/06/2016  . Obesity 11/06/2015  . Urge incontinence 01/24/2015  . Estrogen deficiency 10/25/2014  . Lichen planus 14/05/3012  . Encounter for Medicare annual wellness exam 06/17/2013  . Screening mammogram, encounter for 06/15/2012  . Prediabetes 06/03/2011  . HYPERTENSION, BENIGN ESSENTIAL 10/23/2010  . OSTEOARTHRITIS, HANDS, BILATERAL 01/24/2010  . Hypothyroidism 08/24/2008  . Vitamin D deficiency 06/06/2008  . Hyperlipidemia 06/06/2008  . MENOPAUSAL SYNDROME 03/29/2008  . Osteopenia 03/29/2008  . ALLERGIC RHINITIS 01/25/2008  . IBS 01/25/2008  . OSTEOARTHRITIS 01/25/2008  . FIBROMYALGIA 01/25/2008    [name], PT 11/21/2020, 4:06 PM  [facility]. [place], [place], [address] Phone: [phone]   Fax:  [phone]  Name: Linda Mcgrath MRN: [id number] Date of Birth: 1933-06-09

## 2020-11-23 ENCOUNTER — Encounter: Payer: Self-pay | Admitting: Physical Therapy

## 2020-11-23 ENCOUNTER — Other Ambulatory Visit: Payer: Self-pay

## 2020-11-23 ENCOUNTER — Ambulatory Visit: Payer: Medicare Other | Admitting: Physical Therapy

## 2020-11-23 DIAGNOSIS — M542 Cervicalgia: Secondary | ICD-10-CM | POA: Diagnosis not present

## 2020-11-23 DIAGNOSIS — R262 Difficulty in walking, not elsewhere classified: Secondary | ICD-10-CM

## 2020-11-23 DIAGNOSIS — M545 Low back pain, unspecified: Secondary | ICD-10-CM

## 2020-11-23 DIAGNOSIS — M62830 Muscle spasm of back: Secondary | ICD-10-CM

## 2020-11-23 NOTE — Therapy (Signed)
[facility]. [place], [place], [address] Phone: [phone]   Fax:  [phone]  Physical Therapy Treatment  Patient Details  Name: Linda Mcgrath MRN: [id number] Date of Birth: 29-Mar-1933 Referring Provider (PT): [name]   Encounter Date: 11/23/2020   PT End of Session - 11/23/20 1600    Visit Number 18    Date for PT Re-Evaluation 12/08/20    PT Start Time 1525    PT Stop Time 1614    PT Time Calculation (min) 49 min    Activity Tolerance Patient tolerated treatment well    Behavior During Therapy [facility] for tasks assessed/performed           Past Medical History:  Diagnosis Date  . Allergic rhinitis   . Arthritis   . Asthma   . Cataract    Bil/lens implant  . Colon polyp 1999   small polyp  . Diverticulosis   . Dysrhythmia   . Fibromyalgia   . Hyperlipidemia   . Hypothyroidism   . Internal hemorrhoids   . Leg cramps   . Lichen planus   . Lung nodule    stable LUL 9 mm  . Menopausal syndrome   . Osteoarthritis   . UTI (urinary tract infection)     Past Surgical History:  Procedure Laterality Date  . Abd U/S  11/1998   negative  . Abd U/S  01/2001   gallbladder polyps  . ABDOMINAL HYSTERECTOMY  1975   total-fibroids  . APPENDECTOMY  1975  . BREAST BIOPSY     benign  . BREAST EXCISIONAL BIOPSY Left 1957  . CARDIOVERSION N/A 03/07/2016   Procedure: CARDIOVERSION;  Surgeon: Adrian Prows, MD;  Location: [facility];  Service: Cardiovascular;  Laterality: N/A;  . CATARACT EXTRACTION     bilateral  . CHOLECYSTECTOMY    . COLONOSCOPY  1998   Diverticulosis; polyp\  . COLONOSCOPY  09/2002   Diverticulosis, hem  . COLONOSCOPY  12/2007   diverticulosis, polyp  . DEXA  10/2001   osteopenia  . ELECTROPHYSIOLOGIC STUDY N/A 04/03/2016   Procedure: A-Flutter Ablation;  Surgeon: Will Meredith Leeds, MD;  Location: [facility];  Service: Cardiovascular;  Laterality: N/A;  .  ESOPHAGOGASTRODUODENOSCOPY  2004  . Hida scan  01/2001   Negative  . KNEE SURGERY     right knee / 11/2004  . LAMINECTOMY WITH POSTERIOR LATERAL ARTHRODESIS LEVEL 2 N/A 07/31/2017   Procedure: DECOMPRESSIVE LAMINECTOMY LUMABR FOUR-FIVE, LUMBAR FIVE-SACRAL ONE;  Surgeon: Eustace Moore, MD;  Location: [facility];  Service: Neurosurgery;  Laterality: N/A;  . laser surgery for glaucoma Left Sep 21, 2014  . MULTIPLE TOOTH EXTRACTIONS    . ROTATOR CUFF REPAIR     x 2 /right shoulder  . SKIN CANCER EXCISION     pre-melanoma / on face  . TEE WITHOUT CARDIOVERSION N/A 03/07/2016   Procedure: TRANSESOPHAGEAL ECHOCARDIOGRAM (TEE);  Surgeon: Adrian Prows, MD;  Location: [facility];  Service: Cardiovascular;  Laterality: N/A;  . TOE SURGERY     rt foot     There were no vitals filed for this visit.   Subjective Assessment - 11/23/20 1539    Subjective I am hurting a lot more, high stress, I took the dogs to the groomers and they just go crazy and it stresses me    Currently in Pain? Yes    Pain Score 6     Pain Location Back   right knee  spinal fusion 07/31/2017  . Right low back pain 03/04/2017  . H/O atrial flutter 03/06/2016  . Obesity 11/06/2015  . Urge incontinence 01/24/2015  . Estrogen deficiency 10/25/2014  . Lichen planus 31/54/0086  . Encounter for Medicare annual wellness exam 06/17/2013  . Screening mammogram, encounter for 06/15/2012  . Prediabetes 06/03/2011  . HYPERTENSION, BENIGN ESSENTIAL 10/23/2010  . OSTEOARTHRITIS, HANDS, BILATERAL 01/24/2010  . Hypothyroidism 08/24/2008  . Vitamin D deficiency 06/06/2008  . Hyperlipidemia 06/06/2008  . MENOPAUSAL SYNDROME 03/29/2008  . Osteopenia 03/29/2008  . ALLERGIC RHINITIS 01/25/2008  . IBS 01/25/2008  . OSTEOARTHRITIS 01/25/2008  . FIBROMYALGIA 01/25/2008    Linda Mcgrath., PT 11/23/2020, 4:04 PM  [facility]. [place], [place], [address] Phone: [phone]   Fax:  [phone]  Name: Linda Mcgrath MRN: [id number] Date of Birth: [birth year]  [facility]. [place], [place], [address] Phone: [phone]   Fax:  [phone]  Physical Therapy Treatment  Patient Details  Name: Linda Mcgrath MRN: [id number] Date of Birth: 29-Mar-1933 Referring Provider (PT): [name]   Encounter Date: 11/23/2020   PT End of Session - 11/23/20 1600    Visit Number 18    Date for PT Re-Evaluation 12/08/20    PT Start Time 1525    PT Stop Time 1614    PT Time Calculation (min) 49 min    Activity Tolerance Patient tolerated treatment well    Behavior During Therapy [facility] for tasks assessed/performed           Past Medical History:  Diagnosis Date  . Allergic rhinitis   . Arthritis   . Asthma   . Cataract    Bil/lens implant  . Colon polyp 1999   small polyp  . Diverticulosis   . Dysrhythmia   . Fibromyalgia   . Hyperlipidemia   . Hypothyroidism   . Internal hemorrhoids   . Leg cramps   . Lichen planus   . Lung nodule    stable LUL 9 mm  . Menopausal syndrome   . Osteoarthritis   . UTI (urinary tract infection)     Past Surgical History:  Procedure Laterality Date  . Abd U/S  11/1998   negative  . Abd U/S  01/2001   gallbladder polyps  . ABDOMINAL HYSTERECTOMY  1975   total-fibroids  . APPENDECTOMY  1975  . BREAST BIOPSY     benign  . BREAST EXCISIONAL BIOPSY Left 1957  . CARDIOVERSION N/A 03/07/2016   Procedure: CARDIOVERSION;  Surgeon: Adrian Prows, MD;  Location: [facility];  Service: Cardiovascular;  Laterality: N/A;  . CATARACT EXTRACTION     bilateral  . CHOLECYSTECTOMY    . COLONOSCOPY  1998   Diverticulosis; polyp\  . COLONOSCOPY  09/2002   Diverticulosis, hem  . COLONOSCOPY  12/2007   diverticulosis, polyp  . DEXA  10/2001   osteopenia  . ELECTROPHYSIOLOGIC STUDY N/A 04/03/2016   Procedure: A-Flutter Ablation;  Surgeon: Will Meredith Leeds, MD;  Location: [facility];  Service: Cardiovascular;  Laterality: N/A;  .  ESOPHAGOGASTRODUODENOSCOPY  2004  . Hida scan  01/2001   Negative  . KNEE SURGERY     right knee / 11/2004  . LAMINECTOMY WITH POSTERIOR LATERAL ARTHRODESIS LEVEL 2 N/A 07/31/2017   Procedure: DECOMPRESSIVE LAMINECTOMY LUMABR FOUR-FIVE, LUMBAR FIVE-SACRAL ONE;  Surgeon: Eustace Moore, MD;  Location: [facility];  Service: Neurosurgery;  Laterality: N/A;  . laser surgery for glaucoma Left Sep 21, 2014  . MULTIPLE TOOTH EXTRACTIONS    . ROTATOR CUFF REPAIR     x 2 /right shoulder  . SKIN CANCER EXCISION     pre-melanoma / on face  . TEE WITHOUT CARDIOVERSION N/A 03/07/2016   Procedure: TRANSESOPHAGEAL ECHOCARDIOGRAM (TEE);  Surgeon: Adrian Prows, MD;  Location: [facility];  Service: Cardiovascular;  Laterality: N/A;  . TOE SURGERY     rt foot     There were no vitals filed for this visit.   Subjective Assessment - 11/23/20 1539    Subjective I am hurting a lot more, high stress, I took the dogs to the groomers and they just go crazy and it stresses me    Currently in Pain? Yes    Pain Score 6     Pain Location Back   right knee

## 2020-11-28 ENCOUNTER — Ambulatory Visit: Payer: Medicare Other | Attending: Family Medicine | Admitting: Physical Therapy

## 2020-11-28 ENCOUNTER — Encounter: Payer: Self-pay | Admitting: Physical Therapy

## 2020-11-28 ENCOUNTER — Other Ambulatory Visit: Payer: Self-pay

## 2020-11-28 DIAGNOSIS — R262 Difficulty in walking, not elsewhere classified: Secondary | ICD-10-CM | POA: Insufficient documentation

## 2020-11-28 DIAGNOSIS — M62830 Muscle spasm of back: Secondary | ICD-10-CM | POA: Insufficient documentation

## 2020-11-28 DIAGNOSIS — M542 Cervicalgia: Secondary | ICD-10-CM | POA: Insufficient documentation

## 2020-11-28 DIAGNOSIS — M545 Low back pain, unspecified: Secondary | ICD-10-CM | POA: Insufficient documentation

## 2020-11-28 NOTE — Therapy (Signed)
Cervicalgia     Problem List Patient Active Problem List   Diagnosis Date Noted  . Chronic upper back pain 08/30/2020  . Aortic atherosclerosis ([place]) 05/31/2020  . Abnormal CT scan, small bowel 05/18/2020  . Generalized abdominal pain 05/17/2020  . Duodenitis 03/13/2020  . Right lateral abdominal pain 03/07/2020  . Insomnia 12/15/2019  . Chronic low back pain 10/18/2019  . Positive depression screening 10/18/2019  . Osteoarthritis of right knee 08/02/2019  . Diarrhea 07/03/2018  . Dyspepsia 07/03/2018  . S/P lumbar spinal fusion 07/31/2017  . Right low back pain 03/04/2017  . H/O atrial flutter 03/06/2016  . Obesity 11/06/2015  . Urge incontinence 01/24/2015  . Estrogen deficiency 10/25/2014  . Lichen planus 41/74/0814  . Encounter for Medicare annual wellness exam 06/17/2013  . Screening mammogram, encounter for 06/15/2012  . Prediabetes 06/03/2011  . HYPERTENSION, BENIGN ESSENTIAL 10/23/2010  . OSTEOARTHRITIS, HANDS, BILATERAL 01/24/2010  . Hypothyroidism 08/24/2008  . Vitamin D deficiency 06/06/2008  . Hyperlipidemia 06/06/2008  . MENOPAUSAL SYNDROME 03/29/2008  . Osteopenia 03/29/2008  . ALLERGIC RHINITIS 01/25/2008  . IBS 01/25/2008  . OSTEOARTHRITIS 01/25/2008  . FIBROMYALGIA 01/25/2008    [name], PT 11/28/2020, 3:18 PM  [place]. [place], [place], [address] Phone: [phone]   Fax:  [phone]  Name: SAKIA SCHRIMPF MRN: [id number] Date of Birth: 1933-07-29  [facility]. [place], [place], [address] Phone: [phone]   Fax:  [phone]  Physical Therapy Treatment  Patient Details  Name: Linda Mcgrath MRN: [id number] Date of Birth: [birth year] Referring Provider (PT): [name]   Encounter Date: 11/28/2020   PT End of Session - 11/28/20 1515    Visit Number 19    Date for PT Re-Evaluation 12/08/20    PT Start Time 1438    PT Stop Time 1522    PT Time Calculation (min) 44 min    Activity Tolerance Patient tolerated treatment well    Behavior During Therapy [facility] for tasks assessed/performed           Past Medical History:  Diagnosis Date  . Allergic rhinitis   . Arthritis   . Asthma   . Cataract    Bil/lens implant  . Colon polyp 1999   small polyp  . Diverticulosis   . Dysrhythmia   . Fibromyalgia   . Hyperlipidemia   . Hypothyroidism   . Internal hemorrhoids   . Leg cramps   . Lichen planus   . Lung nodule    stable LUL 9 mm  . Menopausal syndrome   . Osteoarthritis   . UTI (urinary tract infection)     Past Surgical History:  Procedure Laterality Date  . Abd U/S  11/1998   negative  . Abd U/S  01/2001   gallbladder polyps  . ABDOMINAL HYSTERECTOMY  1975   total-fibroids  . APPENDECTOMY  1975  . BREAST BIOPSY     benign  . BREAST EXCISIONAL BIOPSY Left 1957  . CARDIOVERSION N/A 03/07/2016   Procedure: CARDIOVERSION;  Surgeon: Adrian Prows, MD;  Location: [facility];  Service: Cardiovascular;  Laterality: N/A;  . CATARACT EXTRACTION     bilateral  . CHOLECYSTECTOMY    . COLONOSCOPY  1998   Diverticulosis; polyp\  . COLONOSCOPY  09/2002   Diverticulosis, hem  . COLONOSCOPY  12/2007   diverticulosis, polyp  . DEXA  10/2001   osteopenia  . ELECTROPHYSIOLOGIC STUDY N/A 04/03/2016   Procedure: A-Flutter Ablation;  Surgeon: Will Meredith Leeds, MD;  Location: [facility];  Service: Cardiovascular;  Laterality: N/A;  .  ESOPHAGOGASTRODUODENOSCOPY  2004  . Hida scan  01/2001   Negative  . KNEE SURGERY     right knee / 11/2004  . LAMINECTOMY WITH POSTERIOR LATERAL ARTHRODESIS LEVEL 2 N/A 07/31/2017   Procedure: DECOMPRESSIVE LAMINECTOMY LUMABR FOUR-FIVE, LUMBAR FIVE-SACRAL ONE;  Surgeon: Eustace Moore, MD;  Location: [facility];  Service: Neurosurgery;  Laterality: N/A;  . laser surgery for glaucoma Left Sep 21, 2014  . MULTIPLE TOOTH EXTRACTIONS    . ROTATOR CUFF REPAIR     x 2 /right shoulder  . SKIN CANCER EXCISION     pre-melanoma / on face  . TEE WITHOUT CARDIOVERSION N/A 03/07/2016   Procedure: TRANSESOPHAGEAL ECHOCARDIOGRAM (TEE);  Surgeon: Adrian Prows, MD;  Location: [facility];  Service: Cardiovascular;  Laterality: N/A;  . TOE SURGERY     rt foot     There were no vitals filed for this visit.   Subjective Assessment - 11/28/20 1440    Subjective Patient reports "had a time with pain in my knee and my back" over the weekend    Currently in Pain? Yes    Pain Score 7     Pain Location Knee   back   Pain Orientation Right    Aggravating Factors  "  [place]. [place], [place], [address] Phone: [phone]   Fax:  [phone]  Physical Therapy Treatment  Patient Details  Name: Linda Mcgrath MRN: [id number] Date of Birth: [birth year] Referring Provider (PT): [name]   Encounter Date: 11/28/2020   PT End of Session - 11/28/20 1515    Visit Number 19    Date for PT Re-Evaluation 12/08/20    PT Start Time 1438    PT Stop Time 1522    PT Time Calculation (min) 44 min    Activity Tolerance Patient tolerated treatment well    Behavior During Therapy [facility] for tasks assessed/performed           Past Medical History:  Diagnosis Date  . Allergic rhinitis   . Arthritis   . Asthma   . Cataract    Bil/lens implant  . Colon polyp 1999   small polyp  . Diverticulosis   . Dysrhythmia   . Fibromyalgia   . Hyperlipidemia   . Hypothyroidism   . Internal hemorrhoids   . Leg cramps   . Lichen planus   . Lung nodule    stable LUL 9 mm  . Menopausal syndrome   . Osteoarthritis   . UTI (urinary tract infection)     Past Surgical History:  Procedure Laterality Date  . Abd U/S  11/1998   negative  . Abd U/S  01/2001   gallbladder polyps  . ABDOMINAL HYSTERECTOMY  1975   total-fibroids  . APPENDECTOMY  1975  . BREAST BIOPSY     benign  . BREAST EXCISIONAL BIOPSY Left 1957  . CARDIOVERSION N/A 03/07/2016   Procedure: CARDIOVERSION;  Surgeon: Adrian Prows, MD;  Location: [facility];  Service: Cardiovascular;  Laterality: N/A;  . CATARACT EXTRACTION     bilateral  . CHOLECYSTECTOMY    . COLONOSCOPY  1998   Diverticulosis; polyp\  . COLONOSCOPY  09/2002   Diverticulosis, hem  . COLONOSCOPY  12/2007   diverticulosis, polyp  . DEXA  10/2001   osteopenia  . ELECTROPHYSIOLOGIC STUDY N/A 04/03/2016   Procedure: A-Flutter Ablation;  Surgeon: Will Meredith Leeds, MD;  Location: [facility];  Service: Cardiovascular;  Laterality: N/A;  .  ESOPHAGOGASTRODUODENOSCOPY  2004  . Hida scan  01/2001   Negative  . KNEE SURGERY     right knee / 11/2004  . LAMINECTOMY WITH POSTERIOR LATERAL ARTHRODESIS LEVEL 2 N/A 07/31/2017   Procedure: DECOMPRESSIVE LAMINECTOMY LUMABR FOUR-FIVE, LUMBAR FIVE-SACRAL ONE;  Surgeon: Eustace Moore, MD;  Location: [facility];  Service: Neurosurgery;  Laterality: N/A;  . laser surgery for glaucoma Left Sep 21, 2014  . MULTIPLE TOOTH EXTRACTIONS    . ROTATOR CUFF REPAIR     x 2 /right shoulder  . SKIN CANCER EXCISION     pre-melanoma / on face  . TEE WITHOUT CARDIOVERSION N/A 03/07/2016   Procedure: TRANSESOPHAGEAL ECHOCARDIOGRAM (TEE);  Surgeon: Adrian Prows, MD;  Location: [facility];  Service: Cardiovascular;  Laterality: N/A;  . TOE SURGERY     rt foot     There were no vitals filed for this visit.   Subjective Assessment - 11/28/20 1440    Subjective Patient reports "had a time with pain in my knee and my back" over the weekend    Currently in Pain? Yes    Pain Score 7     Pain Location Knee   back   Pain Orientation Right    Aggravating Factors  "

## 2020-11-30 ENCOUNTER — Encounter: Payer: Self-pay | Admitting: Physical Therapy

## 2020-11-30 ENCOUNTER — Other Ambulatory Visit: Payer: Self-pay

## 2020-11-30 ENCOUNTER — Ambulatory Visit: Payer: Medicare Other | Admitting: Physical Therapy

## 2020-11-30 DIAGNOSIS — M542 Cervicalgia: Secondary | ICD-10-CM

## 2020-11-30 DIAGNOSIS — M62830 Muscle spasm of back: Secondary | ICD-10-CM | POA: Diagnosis not present

## 2020-11-30 DIAGNOSIS — R262 Difficulty in walking, not elsewhere classified: Secondary | ICD-10-CM

## 2020-11-30 DIAGNOSIS — M545 Low back pain, unspecified: Secondary | ICD-10-CM

## 2020-11-30 NOTE — Therapy (Signed)
[address] [place], [place], [address] Phone: [phone]   Fax:  [phone] Progress Note Reporting Period 10/19/20 to 11/30/20 for visits 11-20  See note below for Objective Data and Assessment of Progress/Goals.      Physical Therapy Treatment  Patient Details  Name: Linda Mcgrath MRN: [id number] Date of Birth: Mar 20, 1933 Referring Provider (PT): [name]   Encounter Date: 11/30/2020   PT End of Session - 11/30/20 1515    Visit Number 20    Date for PT Re-Evaluation 12/08/20    PT Start Time 1435    PT Stop Time 1525    PT Time Calculation (min) 50 min    Activity Tolerance Patient tolerated treatment well    Behavior During Therapy [facility] for tasks assessed/performed           Past Medical History:  Diagnosis Date  . Allergic rhinitis   . Arthritis   . Asthma   . Cataract    Bil/lens implant  . Colon polyp 1999   small polyp  . Diverticulosis   . Dysrhythmia   . Fibromyalgia   . Hyperlipidemia   . Hypothyroidism   . Internal hemorrhoids   . Leg cramps   . Lichen planus   . Lung nodule    stable LUL 9 mm  . Menopausal syndrome   . Osteoarthritis   . UTI (urinary tract infection)     Past Surgical History:  Procedure Laterality Date  . Abd U/S  11/1998   negative  . Abd U/S  01/2001   gallbladder polyps  . ABDOMINAL HYSTERECTOMY  1975   total-fibroids  . APPENDECTOMY  1975  . BREAST BIOPSY     benign  . BREAST EXCISIONAL BIOPSY Left 1957  . CARDIOVERSION N/A 03/07/2016   Procedure: CARDIOVERSION;  Surgeon: Adrian Prows, MD;  Location: [facility];  Service: Cardiovascular;  Laterality: N/A;  . CATARACT EXTRACTION     bilateral  . CHOLECYSTECTOMY    . COLONOSCOPY  1998   Diverticulosis; polyp\  . COLONOSCOPY  09/2002   Diverticulosis, hem  . COLONOSCOPY  12/2007   diverticulosis, polyp  . DEXA  10/2001   osteopenia  . ELECTROPHYSIOLOGIC STUDY N/A 04/03/2016   Procedure:  A-Flutter Ablation;  Surgeon: Will Meredith Leeds, MD;  Location: [facility];  Service: Cardiovascular;  Laterality: N/A;  . ESOPHAGOGASTRODUODENOSCOPY  2004  . Hida scan  01/2001   Negative  . KNEE SURGERY     right knee / 11/2004  . LAMINECTOMY WITH POSTERIOR LATERAL ARTHRODESIS LEVEL 2 N/A 07/31/2017   Procedure: DECOMPRESSIVE LAMINECTOMY LUMABR FOUR-FIVE, LUMBAR FIVE-SACRAL ONE;  Surgeon: Eustace Moore, MD;  Location: [facility];  Service: Neurosurgery;  Laterality: N/A;  . laser surgery for glaucoma Left Sep 21, 2014  . MULTIPLE TOOTH EXTRACTIONS    . ROTATOR CUFF REPAIR     x 2 /right shoulder  . SKIN CANCER EXCISION     pre-melanoma / on face  . TEE WITHOUT CARDIOVERSION N/A 03/07/2016   Procedure: TRANSESOPHAGEAL ECHOCARDIOGRAM (TEE);  Surgeon: Adrian Prows, MD;  Location: [facility];  Service: Cardiovascular;  Laterality: N/A;  . TOE SURGERY     rt foot     There were no vitals filed for this visit.   Subjective Assessment - 11/30/20 1438    Subjective Patient reports that she is hurting more today, reports that she was at the dentist yesterday, lying on back for over an hour, reports  [address] [place], [place], [address] Phone: [phone]   Fax:  [phone] Progress Note Reporting Period 10/19/20 to 11/30/20 for visits 11-20  See note below for Objective Data and Assessment of Progress/Goals.      Physical Therapy Treatment  Patient Details  Name: Linda Mcgrath MRN: [id number] Date of Birth: Mar 20, 1933 Referring Provider (PT): [name]   Encounter Date: 11/30/2020   PT End of Session - 11/30/20 1515    Visit Number 20    Date for PT Re-Evaluation 12/08/20    PT Start Time 1435    PT Stop Time 1525    PT Time Calculation (min) 50 min    Activity Tolerance Patient tolerated treatment well    Behavior During Therapy [facility] for tasks assessed/performed           Past Medical History:  Diagnosis Date  . Allergic rhinitis   . Arthritis   . Asthma   . Cataract    Bil/lens implant  . Colon polyp 1999   small polyp  . Diverticulosis   . Dysrhythmia   . Fibromyalgia   . Hyperlipidemia   . Hypothyroidism   . Internal hemorrhoids   . Leg cramps   . Lichen planus   . Lung nodule    stable LUL 9 mm  . Menopausal syndrome   . Osteoarthritis   . UTI (urinary tract infection)     Past Surgical History:  Procedure Laterality Date  . Abd U/S  11/1998   negative  . Abd U/S  01/2001   gallbladder polyps  . ABDOMINAL HYSTERECTOMY  1975   total-fibroids  . APPENDECTOMY  1975  . BREAST BIOPSY     benign  . BREAST EXCISIONAL BIOPSY Left 1957  . CARDIOVERSION N/A 03/07/2016   Procedure: CARDIOVERSION;  Surgeon: Adrian Prows, MD;  Location: [facility];  Service: Cardiovascular;  Laterality: N/A;  . CATARACT EXTRACTION     bilateral  . CHOLECYSTECTOMY    . COLONOSCOPY  1998   Diverticulosis; polyp\  . COLONOSCOPY  09/2002   Diverticulosis, hem  . COLONOSCOPY  12/2007   diverticulosis, polyp  . DEXA  10/2001   osteopenia  . ELECTROPHYSIOLOGIC STUDY N/A 04/03/2016   Procedure:  A-Flutter Ablation;  Surgeon: Will Meredith Leeds, MD;  Location: [facility];  Service: Cardiovascular;  Laterality: N/A;  . ESOPHAGOGASTRODUODENOSCOPY  2004  . Hida scan  01/2001   Negative  . KNEE SURGERY     right knee / 11/2004  . LAMINECTOMY WITH POSTERIOR LATERAL ARTHRODESIS LEVEL 2 N/A 07/31/2017   Procedure: DECOMPRESSIVE LAMINECTOMY LUMABR FOUR-FIVE, LUMBAR FIVE-SACRAL ONE;  Surgeon: Eustace Moore, MD;  Location: [facility];  Service: Neurosurgery;  Laterality: N/A;  . laser surgery for glaucoma Left Sep 21, 2014  . MULTIPLE TOOTH EXTRACTIONS    . ROTATOR CUFF REPAIR     x 2 /right shoulder  . SKIN CANCER EXCISION     pre-melanoma / on face  . TEE WITHOUT CARDIOVERSION N/A 03/07/2016   Procedure: TRANSESOPHAGEAL ECHOCARDIOGRAM (TEE);  Surgeon: Adrian Prows, MD;  Location: [facility];  Service: Cardiovascular;  Laterality: N/A;  . TOE SURGERY     rt foot     There were no vitals filed for this visit.   Subjective Assessment - 11/30/20 1438    Subjective Patient reports that she is hurting more today, reports that she was at the dentist yesterday, lying on back for over an hour, reports  for Medicare annual wellness exam 06/17/2013  . Screening mammogram, encounter for 06/15/2012  . Prediabetes 06/03/2011  . HYPERTENSION, BENIGN ESSENTIAL 10/23/2010  . OSTEOARTHRITIS, HANDS, BILATERAL 01/24/2010  . Hypothyroidism 08/24/2008  . Vitamin D deficiency 06/06/2008  . Hyperlipidemia 06/06/2008  . MENOPAUSAL SYNDROME 03/29/2008  . Osteopenia 03/29/2008  . ALLERGIC RHINITIS 01/25/2008  . IBS 01/25/2008  . OSTEOARTHRITIS 01/25/2008  . FIBROMYALGIA 01/25/2008    [name], PT 11/30/2020, 3:20 PM  [address] [place], [place], [address] Phone: [phone]   Fax:  [phone]  Name: GRACILYN GUNIA MRN: [id number] Date of Birth: 1932/12/09

## 2020-12-05 ENCOUNTER — Other Ambulatory Visit: Payer: Self-pay

## 2020-12-05 ENCOUNTER — Encounter: Payer: Self-pay | Admitting: Physical Therapy

## 2020-12-05 ENCOUNTER — Ambulatory Visit: Payer: Medicare Other | Admitting: Physical Therapy

## 2020-12-05 DIAGNOSIS — M62830 Muscle spasm of back: Secondary | ICD-10-CM | POA: Diagnosis not present

## 2020-12-05 DIAGNOSIS — M542 Cervicalgia: Secondary | ICD-10-CM

## 2020-12-05 DIAGNOSIS — M545 Low back pain, unspecified: Secondary | ICD-10-CM | POA: Diagnosis not present

## 2020-12-05 DIAGNOSIS — R262 Difficulty in walking, not elsewhere classified: Secondary | ICD-10-CM | POA: Diagnosis not present

## 2020-12-05 NOTE — Therapy (Signed)
Obesity 11/06/2015  . Urge incontinence 01/24/2015  . Estrogen deficiency 10/25/2014  . Lichen planus 15/40/0867  . Encounter for Medicare annual wellness exam 06/17/2013  . Screening mammogram, encounter for 06/15/2012  . Prediabetes 06/03/2011  . HYPERTENSION, BENIGN ESSENTIAL 10/23/2010  . OSTEOARTHRITIS, HANDS, BILATERAL 01/24/2010  . Hypothyroidism 08/24/2008  . Vitamin D deficiency 06/06/2008  . Hyperlipidemia 06/06/2008  . MENOPAUSAL SYNDROME 03/29/2008  . Osteopenia 03/29/2008  . ALLERGIC RHINITIS 01/25/2008  . IBS 01/25/2008  . OSTEOARTHRITIS 01/25/2008  . FIBROMYALGIA 01/25/2008    [name], PT 12/05/2020, 3:26 PM  [address] [place], [place], [address] Phone: [phone]   Fax:  [phone]  Name: Linda Mcgrath MRN: [id number] Date of Birth: [birth year]  [address]. [place], [place], [address] Phone: [phone]   Fax:  [phone]  Physical Therapy Treatment  Patient Details  Name: Linda Mcgrath MRN: [id number] Date of Birth: 13-Nov-1932 Referring Provider (PT): [name]   Encounter Date: 12/05/2020   PT End of Session - 12/05/20 1506    Visit Number 21    Date for PT Re-Evaluation 12/08/20    PT Start Time 1431    PT Stop Time 1520    PT Time Calculation (min) 49 min    Activity Tolerance Patient tolerated treatment well    Behavior During Therapy [facility] for tasks assessed/performed           Past Medical History:  Diagnosis Date  . Allergic rhinitis   . Arthritis   . Asthma   . Cataract    Bil/lens implant  . Colon polyp 1999   small polyp  . Diverticulosis   . Dysrhythmia   . Fibromyalgia   . Hyperlipidemia   . Hypothyroidism   . Internal hemorrhoids   . Leg cramps   . Lichen planus   . Lung nodule    stable LUL 9 mm  . Menopausal syndrome   . Osteoarthritis   . UTI (urinary tract infection)     Past Surgical History:  Procedure Laterality Date  . Abd U/S  11/1998   negative  . Abd U/S  01/2001   gallbladder polyps  . ABDOMINAL HYSTERECTOMY  1975   total-fibroids  . APPENDECTOMY  1975  . BREAST BIOPSY     benign  . BREAST EXCISIONAL BIOPSY Left 1957  . CARDIOVERSION N/A 03/07/2016   Procedure: CARDIOVERSION;  Surgeon: Adrian Prows, MD;  Location: [facility];  Service: Cardiovascular;  Laterality: N/A;  . CATARACT EXTRACTION     bilateral  . CHOLECYSTECTOMY    . COLONOSCOPY  1998   Diverticulosis; polyp\  . COLONOSCOPY  09/2002   Diverticulosis, hem  . COLONOSCOPY  12/2007   diverticulosis, polyp  . DEXA  10/2001   osteopenia  . ELECTROPHYSIOLOGIC STUDY N/A 04/03/2016   Procedure: A-Flutter Ablation;  Surgeon: Will Meredith Leeds, MD;  Location: [facility];  Service: Cardiovascular;  Laterality: N/A;  .  ESOPHAGOGASTRODUODENOSCOPY  2004  . Hida scan  01/2001   Negative  . KNEE SURGERY     right knee / 11/2004  . LAMINECTOMY WITH POSTERIOR LATERAL ARTHRODESIS LEVEL 2 N/A 07/31/2017   Procedure: DECOMPRESSIVE LAMINECTOMY LUMABR FOUR-FIVE, LUMBAR FIVE-SACRAL ONE;  Surgeon: Eustace Moore, MD;  Location: [facility];  Service: Neurosurgery;  Laterality: N/A;  . laser surgery for glaucoma Left Sep 21, 2014  . MULTIPLE TOOTH EXTRACTIONS    . ROTATOR CUFF REPAIR     x 2 /right shoulder  . SKIN CANCER EXCISION     pre-melanoma / on face  . TEE WITHOUT CARDIOVERSION N/A 03/07/2016   Procedure: TRANSESOPHAGEAL ECHOCARDIOGRAM (TEE);  Surgeon: Adrian Prows, MD;  Location: [facility];  Service: Cardiovascular;  Laterality: N/A;  . TOE SURGERY     rt foot     There were no vitals filed for this visit.   Subjective Assessment - 12/05/20 1431    Subjective I am tired, reports that she has company and has been more active than normal.  REports that she did some vacuuming.    Currently in Pain? Yes    Pain Score 6     Pain Location Back    Pain Orientation Lower  [address]. [place], [place], [address] Phone: [phone]   Fax:  [phone]  Physical Therapy Treatment  Patient Details  Name: Linda Mcgrath MRN: [id number] Date of Birth: 13-Nov-1932 Referring Provider (PT): [name]   Encounter Date: 12/05/2020   PT End of Session - 12/05/20 1506    Visit Number 21    Date for PT Re-Evaluation 12/08/20    PT Start Time 1431    PT Stop Time 1520    PT Time Calculation (min) 49 min    Activity Tolerance Patient tolerated treatment well    Behavior During Therapy [facility] for tasks assessed/performed           Past Medical History:  Diagnosis Date  . Allergic rhinitis   . Arthritis   . Asthma   . Cataract    Bil/lens implant  . Colon polyp 1999   small polyp  . Diverticulosis   . Dysrhythmia   . Fibromyalgia   . Hyperlipidemia   . Hypothyroidism   . Internal hemorrhoids   . Leg cramps   . Lichen planus   . Lung nodule    stable LUL 9 mm  . Menopausal syndrome   . Osteoarthritis   . UTI (urinary tract infection)     Past Surgical History:  Procedure Laterality Date  . Abd U/S  11/1998   negative  . Abd U/S  01/2001   gallbladder polyps  . ABDOMINAL HYSTERECTOMY  1975   total-fibroids  . APPENDECTOMY  1975  . BREAST BIOPSY     benign  . BREAST EXCISIONAL BIOPSY Left 1957  . CARDIOVERSION N/A 03/07/2016   Procedure: CARDIOVERSION;  Surgeon: Adrian Prows, MD;  Location: [facility];  Service: Cardiovascular;  Laterality: N/A;  . CATARACT EXTRACTION     bilateral  . CHOLECYSTECTOMY    . COLONOSCOPY  1998   Diverticulosis; polyp\  . COLONOSCOPY  09/2002   Diverticulosis, hem  . COLONOSCOPY  12/2007   diverticulosis, polyp  . DEXA  10/2001   osteopenia  . ELECTROPHYSIOLOGIC STUDY N/A 04/03/2016   Procedure: A-Flutter Ablation;  Surgeon: Will Meredith Leeds, MD;  Location: [facility];  Service: Cardiovascular;  Laterality: N/A;  .  ESOPHAGOGASTRODUODENOSCOPY  2004  . Hida scan  01/2001   Negative  . KNEE SURGERY     right knee / 11/2004  . LAMINECTOMY WITH POSTERIOR LATERAL ARTHRODESIS LEVEL 2 N/A 07/31/2017   Procedure: DECOMPRESSIVE LAMINECTOMY LUMABR FOUR-FIVE, LUMBAR FIVE-SACRAL ONE;  Surgeon: Eustace Moore, MD;  Location: [facility];  Service: Neurosurgery;  Laterality: N/A;  . laser surgery for glaucoma Left Sep 21, 2014  . MULTIPLE TOOTH EXTRACTIONS    . ROTATOR CUFF REPAIR     x 2 /right shoulder  . SKIN CANCER EXCISION     pre-melanoma / on face  . TEE WITHOUT CARDIOVERSION N/A 03/07/2016   Procedure: TRANSESOPHAGEAL ECHOCARDIOGRAM (TEE);  Surgeon: Adrian Prows, MD;  Location: [facility];  Service: Cardiovascular;  Laterality: N/A;  . TOE SURGERY     rt foot     There were no vitals filed for this visit.   Subjective Assessment - 12/05/20 1431    Subjective I am tired, reports that she has company and has been more active than normal.  REports that she did some vacuuming.    Currently in Pain? Yes    Pain Score 6     Pain Location Back    Pain Orientation Lower

## 2020-12-07 ENCOUNTER — Ambulatory Visit: Payer: Medicare Other | Admitting: Physical Therapy

## 2020-12-12 ENCOUNTER — Other Ambulatory Visit: Payer: Self-pay

## 2020-12-12 ENCOUNTER — Ambulatory Visit: Payer: Medicare Other | Admitting: Physical Therapy

## 2020-12-12 ENCOUNTER — Encounter: Payer: Self-pay | Admitting: Physical Therapy

## 2020-12-12 DIAGNOSIS — M62830 Muscle spasm of back: Secondary | ICD-10-CM | POA: Diagnosis not present

## 2020-12-12 DIAGNOSIS — M545 Low back pain, unspecified: Secondary | ICD-10-CM | POA: Diagnosis not present

## 2020-12-12 DIAGNOSIS — R262 Difficulty in walking, not elsewhere classified: Secondary | ICD-10-CM | POA: Diagnosis not present

## 2020-12-12 DIAGNOSIS — M542 Cervicalgia: Secondary | ICD-10-CM

## 2020-12-12 NOTE — Therapy (Signed)
[address]. [place], [place], [address] Phone: [phone]   Fax:  [phone]  Physical Therapy Treatment  Patient Details  Name: Linda Mcgrath MRN: [id number] Date of Birth: 03/02/1933 Referring Provider (PT): [name]   Encounter Date: 12/12/2020   PT End of Session - 12/12/20 1138    Visit Number 22    Date for PT Re-Evaluation 01/07/21    PT Start Time 1055    PT Stop Time 1144    PT Time Calculation (min) 49 min    Activity Tolerance Patient tolerated treatment well    Behavior During Therapy [facility] for tasks assessed/performed           Past Medical History:  Diagnosis Date  . Allergic rhinitis   . Arthritis   . Asthma   . Cataract    Bil/lens implant  . Colon polyp 1999   small polyp  . Diverticulosis   . Dysrhythmia   . Fibromyalgia   . Hyperlipidemia   . Hypothyroidism   . Internal hemorrhoids   . Leg cramps   . Lichen planus   . Lung nodule    stable LUL 9 mm  . Menopausal syndrome   . Osteoarthritis   . UTI (urinary tract infection)     Past Surgical History:  Procedure Laterality Date  . Abd U/S  11/1998   negative  . Abd U/S  01/2001   gallbladder polyps  . ABDOMINAL HYSTERECTOMY  1975   total-fibroids  . APPENDECTOMY  1975  . BREAST BIOPSY     benign  . BREAST EXCISIONAL BIOPSY Left 1957  . CARDIOVERSION N/A 03/07/2016   Procedure: CARDIOVERSION;  Surgeon: Adrian Prows, MD;  Location: [facility];  Service: Cardiovascular;  Laterality: N/A;  . CATARACT EXTRACTION     bilateral  . CHOLECYSTECTOMY    . COLONOSCOPY  1998   Diverticulosis; polyp\  . COLONOSCOPY  09/2002   Diverticulosis, hem  . COLONOSCOPY  12/2007   diverticulosis, polyp  . DEXA  10/2001   osteopenia  . ELECTROPHYSIOLOGIC STUDY N/A 04/03/2016   Procedure: A-Flutter Ablation;  Surgeon: Will Meredith Leeds, MD;  Location: [facility];  Service: Cardiovascular;  Laterality: N/A;  .  ESOPHAGOGASTRODUODENOSCOPY  2004  . Hida scan  01/2001   Negative  . KNEE SURGERY     right knee / 11/2004  . LAMINECTOMY WITH POSTERIOR LATERAL ARTHRODESIS LEVEL 2 N/A 07/31/2017   Procedure: DECOMPRESSIVE LAMINECTOMY LUMABR FOUR-FIVE, LUMBAR FIVE-SACRAL ONE;  Surgeon: Eustace Moore, MD;  Location: [facility];  Service: Neurosurgery;  Laterality: N/A;  . laser surgery for glaucoma Left Sep 21, 2014  . MULTIPLE TOOTH EXTRACTIONS    . ROTATOR CUFF REPAIR     x 2 /right shoulder  . SKIN CANCER EXCISION     pre-melanoma / on face  . TEE WITHOUT CARDIOVERSION N/A 03/07/2016   Procedure: TRANSESOPHAGEAL ECHOCARDIOGRAM (TEE);  Surgeon: Adrian Prows, MD;  Location: [facility];  Service: Cardiovascular;  Laterality: N/A;  . TOE SURGERY     rt foot     There were no vitals filed for this visit.   Subjective Assessment - 12/12/20 1101    Subjective Patient reports that she has had company over the past week and she is worn out, she reports that she is feeling better and is "surprised I don't have more pain"    Currently in Pain? Yes    Pain Score 2     Pain  D deficiency 06/06/2008  . Hyperlipidemia 06/06/2008  . MENOPAUSAL SYNDROME 03/29/2008  . Osteopenia 03/29/2008  . ALLERGIC RHINITIS 01/25/2008  . IBS 01/25/2008  . OSTEOARTHRITIS 01/25/2008  . FIBROMYALGIA 01/25/2008    [name], PT 12/12/2020, 11:44 AM  [address]. [place], [place], [address] Phone: [phone]   Fax:  [phone]  Name: Linda Mcgrath MRN: [id number] Date of Birth: Apr 12, 1933  D deficiency 06/06/2008  . Hyperlipidemia 06/06/2008  . MENOPAUSAL SYNDROME 03/29/2008  . Osteopenia 03/29/2008  . ALLERGIC RHINITIS 01/25/2008  . IBS 01/25/2008  . OSTEOARTHRITIS 01/25/2008  . FIBROMYALGIA 01/25/2008    [name], PT 12/12/2020, 11:44 AM  [address]. [place], [place], [address] Phone: [phone]   Fax:  [phone]  Name: Linda Mcgrath MRN: [id number] Date of Birth: Apr 12, 1933  [address]. [place], [place], [address] Phone: [phone]   Fax:  [phone]  Physical Therapy Treatment  Patient Details  Name: Linda Mcgrath MRN: [id number] Date of Birth: 03/02/1933 Referring Provider (PT): [name]   Encounter Date: 12/12/2020   PT End of Session - 12/12/20 1138    Visit Number 22    Date for PT Re-Evaluation 01/07/21    PT Start Time 1055    PT Stop Time 1144    PT Time Calculation (min) 49 min    Activity Tolerance Patient tolerated treatment well    Behavior During Therapy [facility] for tasks assessed/performed           Past Medical History:  Diagnosis Date  . Allergic rhinitis   . Arthritis   . Asthma   . Cataract    Bil/lens implant  . Colon polyp 1999   small polyp  . Diverticulosis   . Dysrhythmia   . Fibromyalgia   . Hyperlipidemia   . Hypothyroidism   . Internal hemorrhoids   . Leg cramps   . Lichen planus   . Lung nodule    stable LUL 9 mm  . Menopausal syndrome   . Osteoarthritis   . UTI (urinary tract infection)     Past Surgical History:  Procedure Laterality Date  . Abd U/S  11/1998   negative  . Abd U/S  01/2001   gallbladder polyps  . ABDOMINAL HYSTERECTOMY  1975   total-fibroids  . APPENDECTOMY  1975  . BREAST BIOPSY     benign  . BREAST EXCISIONAL BIOPSY Left 1957  . CARDIOVERSION N/A 03/07/2016   Procedure: CARDIOVERSION;  Surgeon: Adrian Prows, MD;  Location: [facility];  Service: Cardiovascular;  Laterality: N/A;  . CATARACT EXTRACTION     bilateral  . CHOLECYSTECTOMY    . COLONOSCOPY  1998   Diverticulosis; polyp\  . COLONOSCOPY  09/2002   Diverticulosis, hem  . COLONOSCOPY  12/2007   diverticulosis, polyp  . DEXA  10/2001   osteopenia  . ELECTROPHYSIOLOGIC STUDY N/A 04/03/2016   Procedure: A-Flutter Ablation;  Surgeon: Will Meredith Leeds, MD;  Location: [facility];  Service: Cardiovascular;  Laterality: N/A;  .  ESOPHAGOGASTRODUODENOSCOPY  2004  . Hida scan  01/2001   Negative  . KNEE SURGERY     right knee / 11/2004  . LAMINECTOMY WITH POSTERIOR LATERAL ARTHRODESIS LEVEL 2 N/A 07/31/2017   Procedure: DECOMPRESSIVE LAMINECTOMY LUMABR FOUR-FIVE, LUMBAR FIVE-SACRAL ONE;  Surgeon: Eustace Moore, MD;  Location: [facility];  Service: Neurosurgery;  Laterality: N/A;  . laser surgery for glaucoma Left Sep 21, 2014  . MULTIPLE TOOTH EXTRACTIONS    . ROTATOR CUFF REPAIR     x 2 /right shoulder  . SKIN CANCER EXCISION     pre-melanoma / on face  . TEE WITHOUT CARDIOVERSION N/A 03/07/2016   Procedure: TRANSESOPHAGEAL ECHOCARDIOGRAM (TEE);  Surgeon: Adrian Prows, MD;  Location: [facility];  Service: Cardiovascular;  Laterality: N/A;  . TOE SURGERY     rt foot     There were no vitals filed for this visit.   Subjective Assessment - 12/12/20 1101    Subjective Patient reports that she has had company over the past week and she is worn out, she reports that she is feeling better and is "surprised I don't have more pain"    Currently in Pain? Yes    Pain Score 2     Pain

## 2020-12-14 ENCOUNTER — Other Ambulatory Visit: Payer: Self-pay

## 2020-12-14 ENCOUNTER — Encounter: Payer: Self-pay | Admitting: Physical Therapy

## 2020-12-14 ENCOUNTER — Ambulatory Visit: Payer: Medicare Other | Admitting: Physical Therapy

## 2020-12-14 DIAGNOSIS — R262 Difficulty in walking, not elsewhere classified: Secondary | ICD-10-CM | POA: Diagnosis not present

## 2020-12-14 DIAGNOSIS — M542 Cervicalgia: Secondary | ICD-10-CM | POA: Diagnosis not present

## 2020-12-14 DIAGNOSIS — M62830 Muscle spasm of back: Secondary | ICD-10-CM | POA: Diagnosis not present

## 2020-12-14 DIAGNOSIS — M545 Low back pain, unspecified: Secondary | ICD-10-CM

## 2020-12-14 NOTE — Therapy (Signed)
[facility] [address] [place], [place], [address] Phone: [phone]   Fax:  [phone]  Physical Therapy Treatment  Patient Details  Name: Linda Mcgrath MRN: [id number] Date of Birth: 1932-08-28 Referring Provider (PT): [name]   Encounter Date: 12/14/2020   PT End of Session - 12/14/20 1324    Visit Number 23    Date for PT Re-Evaluation 01/07/21    PT Start Time 1245    PT Stop Time 1343    PT Time Calculation (min) 58 min    Activity Tolerance Patient tolerated treatment well    Behavior During Therapy [facility] for tasks assessed/performed           Past Medical History:  Diagnosis Date  . Allergic rhinitis   . Arthritis   . Asthma   . Cataract    Bil/lens implant  . Colon polyp 1999   small polyp  . Diverticulosis   . Dysrhythmia   . Fibromyalgia   . Hyperlipidemia   . Hypothyroidism   . Internal hemorrhoids   . Leg cramps   . Lichen planus   . Lung nodule    stable LUL 9 mm  . Menopausal syndrome   . Osteoarthritis   . UTI (urinary tract infection)     Past Surgical History:  Procedure Laterality Date  . Abd U/S  11/1998   negative  . Abd U/S  01/2001   gallbladder polyps  . ABDOMINAL HYSTERECTOMY  1975   total-fibroids  . APPENDECTOMY  1975  . BREAST BIOPSY     benign  . BREAST EXCISIONAL BIOPSY Left 1957  . CARDIOVERSION N/A 03/07/2016   Procedure: CARDIOVERSION;  Surgeon: Yates Decamp, MD;  Location: [facility] ENDOSCOPY;  Service: Cardiovascular;  Laterality: N/A;  . CATARACT EXTRACTION     bilateral  . CHOLECYSTECTOMY    . COLONOSCOPY  1998   Diverticulosis; polyp\  . COLONOSCOPY  09/2002   Diverticulosis, hem  . COLONOSCOPY  12/2007   diverticulosis, polyp  . DEXA  10/2001   osteopenia  . ELECTROPHYSIOLOGIC STUDY N/A 04/03/2016   Procedure: A-Flutter Ablation;  Surgeon: Will [name], MD;  [facility] INVASIVE CV LAB;  Service: Cardiovascular;  Laterality: N/A;  .  ESOPHAGOGASTRODUODENOSCOPY  2004  . Hida scan  01/2001   Negative  . KNEE SURGERY     right knee / 11/2004  . LAMINECTOMY WITH POSTERIOR LATERAL ARTHRODESIS LEVEL 2 N/A 07/31/2017   Procedure: DECOMPRESSIVE LAMINECTOMY LUMABR FOUR-FIVE, LUMBAR FIVE-SACRAL ONE;  Surgeon: Tia Alert, MD;  Location: [facility] OR;  Service: Neurosurgery;  Laterality: N/A;  . laser surgery for glaucoma Left Sep 21, 2014  . MULTIPLE TOOTH EXTRACTIONS    . ROTATOR CUFF REPAIR     x 2 /right shoulder  . SKIN CANCER EXCISION     pre-melanoma / on face  . TEE WITHOUT CARDIOVERSION N/A 03/07/2016   Procedure: TRANSESOPHAGEAL ECHOCARDIOGRAM (TEE);  Surgeon: Yates Decamp, MD;  Location: [facility] ENDOSCOPY;  Service: Cardiovascular;  Laterality: N/A;  . TOE SURGERY     rt foot     There were no vitals filed for this visit.   Subjective Assessment - 12/14/20 1247    Subjective I am getting back to normal, less hectic.  Feel like I want to walk more    Currently in Pain? Yes    Pain Score 2     Pain Location Back    Pain Orientation Lower    Pain Descriptors / Indicators  Sore;Tightness                             [facility] Adult PT Treatment/Exercise - 12/14/20 0001      Ambulation/Gait   Gait Comments gait outside x 350 feet with SPC and supervision, negotiated a few curbs, had some breathing issues (fatigue and right knee pain)      Lumbar Exercises: Aerobic   Recumbent Bike bike 5 minutes    Nustep Level 5 x 7 minutes      Lumbar Exercises: Seated   Long Arc Quad on Chair Both;2 sets;10 reps    LAQ on Chair Weights (lbs) 3#    Other Seated Lumbar Exercises green tband HS curls, green tband ankle PF/DF, marches 3#, calf raises, toe raises sitting    Other Seated Lumbar Exercises isometric abs, ball b/n knees squeeze, green tband hip abduction, seated lumbar and thoracic extension , gentle side to side rotation and side stretching      Manual Therapy   Manual Therapy Soft tissue mobilization    Manual  therapy comments passive LE stretches    Soft tissue mobilization bilateral lumbar into the bilateral SI area's                    PT Short Term Goals - 09/13/20 1455      PT SHORT TERM GOAL #1   Title independent with HEP    Status On-going             PT Long Term Goals - 12/12/20 1142      PT LONG TERM GOAL #1   Title decrease pain 50%    Status Partially Met      PT LONG TERM GOAL #2   Title increase lumbar ROM 25%    Status Achieved      PT LONG TERM GOAL #3   Title Understand proper posture and body mechanics for housework and yardwork tasks.     Status Achieved      PT LONG TERM GOAL #4   Title tolerate shopping for groceries without an increase in pain    Status Partially Met      PT LONG TERM GOAL #5   Title decrease TUG time to 17 seconds    Status Achieved      PT LONG TERM GOAL #6   Title increase Berg balance test score to 47/56    Status Achieved                 Plan - 12/14/20 1325    Clinical Impression Statement We walked the farthest we have gone and she reports as far as she has gone in a long time.  She did well used SPC and supervision only, no rest break, she was short of breath, c/o knee pain and fatigue.we have discussed her plan to stay active and safe after d/c next week, I asked her to thinkabout things and come up with questions if she has them so we can assure that she is okay    PT Next Visit Plan look for more education and possible D/C at the end of the month    Consulted and Agree with Plan of Care Patient           Patient will benefit from skilled therapeutic intervention in order to improve the following deficits and impairments:  Abnormal gait,Decreased range of motion,Difficulty walking,Increased muscle spasms,Decreased activity tolerance,Pain,Decreased balance,Impaired flexibility,Improper  body mechanics,Postural dysfunction,Decreased strength,Decreased mobility  Visit Diagnosis: Acute bilateral low back  pain without sciatica  Difficulty in walking, not elsewhere classified  Muscle spasm of back  Cervicalgia     Problem List Patient Active Problem List   Diagnosis Date Noted  . Chronic upper back pain 08/30/2020  . Aortic atherosclerosis (HCC) 05/31/2020  . Abnormal CT scan, small bowel 05/18/2020  . Generalized abdominal pain 05/17/2020  . Duodenitis 03/13/2020  . Right lateral abdominal pain 03/07/2020  . Insomnia 12/15/2019  . Chronic low back pain 10/18/2019  . Positive depression screening 10/18/2019  . Osteoarthritis of right knee 08/02/2019  . Diarrhea 07/03/2018  . Dyspepsia 07/03/2018  . S/P lumbar spinal fusion 07/31/2017  . Right low back pain 03/04/2017  . H/O atrial flutter 03/06/2016  . Obesity 11/06/2015  . Urge incontinence 01/24/2015  . Estrogen deficiency 10/25/2014  . Lichen planus 12/20/2013  . Encounter for Medicare annual wellness exam 06/17/2013  . Screening mammogram, encounter for 06/15/2012  . Prediabetes 06/03/2011  . HYPERTENSION, BENIGN ESSENTIAL 10/23/2010  . OSTEOARTHRITIS, HANDS, BILATERAL 01/24/2010  . Hypothyroidism 08/24/2008  . Vitamin D deficiency 06/06/2008  . Hyperlipidemia 06/06/2008  . MENOPAUSAL SYNDROME 03/29/2008  . Osteopenia 03/29/2008  . ALLERGIC RHINITIS 01/25/2008  . IBS 01/25/2008  . OSTEOARTHRITIS 01/25/2008  . FIBROMYALGIA 01/25/2008    Jearld Lesch., PT 12/14/2020, 1:28 PM  [facility] [address] [place], [place], [address] Phone: [phone]   Fax:  [phone]  Name: Linda Mcgrath MRN: [id number] Date of Birth: May 10, 1933

## 2020-12-19 ENCOUNTER — Other Ambulatory Visit: Payer: Self-pay

## 2020-12-19 ENCOUNTER — Ambulatory Visit: Payer: Medicare Other | Admitting: Physical Therapy

## 2020-12-19 ENCOUNTER — Encounter: Payer: Self-pay | Admitting: Physical Therapy

## 2020-12-19 DIAGNOSIS — M542 Cervicalgia: Secondary | ICD-10-CM | POA: Diagnosis not present

## 2020-12-19 DIAGNOSIS — M545 Low back pain, unspecified: Secondary | ICD-10-CM | POA: Diagnosis not present

## 2020-12-19 DIAGNOSIS — M62830 Muscle spasm of back: Secondary | ICD-10-CM

## 2020-12-19 DIAGNOSIS — R262 Difficulty in walking, not elsewhere classified: Secondary | ICD-10-CM

## 2020-12-19 NOTE — Therapy (Signed)
[place], [place], [address] Phone: [phone]   Fax:  [phone]  Physical Therapy Treatment  Patient Details  Name: Linda Mcgrath MRN: [id number] Date of Birth: 1932/11/21 Referring Provider (PT): [name]   Encounter Date: 12/19/2020   PT End of Session - 12/19/20 1150    Visit Number 24    Date for PT Re-Evaluation 01/07/21    PT Start Time 1000    PT Stop Time 1051    PT Time Calculation (min) 51 min    Activity Tolerance Patient tolerated treatment well    Behavior During Therapy [facility] for tasks assessed/performed           Past Medical History:  Diagnosis Date  . Allergic rhinitis   . Arthritis   . Asthma   . Cataract    Bil/lens implant  . Colon polyp 1999   small polyp  . Diverticulosis   . Dysrhythmia   . Fibromyalgia   . Hyperlipidemia   . Hypothyroidism   . Internal hemorrhoids   . Leg cramps   . Lichen planus   . Lung nodule    stable LUL 9 mm  . Menopausal syndrome   . Osteoarthritis   . UTI (urinary tract infection)     Past Surgical History:  Procedure Laterality Date  . Abd U/S  11/1998   negative  . Abd U/S  01/2001   gallbladder polyps  . ABDOMINAL HYSTERECTOMY  1975   total-fibroids  . APPENDECTOMY  1975  . BREAST BIOPSY     benign  . BREAST EXCISIONAL BIOPSY Left 1957  . CARDIOVERSION N/A 03/07/2016   Procedure: CARDIOVERSION;  Surgeon: Adrian Prows, MD;  Location: [place];  Service: Cardiovascular;  Laterality: N/A;  . CATARACT EXTRACTION     bilateral  . CHOLECYSTECTOMY    . COLONOSCOPY  1998   Diverticulosis; polyp\  . COLONOSCOPY  09/2002   Diverticulosis, hem  . COLONOSCOPY  12/2007   diverticulosis, polyp  . DEXA  10/2001   osteopenia  . ELECTROPHYSIOLOGIC STUDY N/A 04/03/2016   Procedure: A-Flutter Ablation;  Surgeon: Will Meredith Leeds, MD;  Location: [place] CV LAB;  Service: Cardiovascular;  Laterality: N/A;  .  ESOPHAGOGASTRODUODENOSCOPY  2004  . Hida scan  01/2001   Negative  . KNEE SURGERY     right knee / 11/2004  . LAMINECTOMY WITH POSTERIOR LATERAL ARTHRODESIS LEVEL 2 N/A 07/31/2017   Procedure: DECOMPRESSIVE LAMINECTOMY LUMABR FOUR-FIVE, LUMBAR FIVE-SACRAL ONE;  Surgeon: Eustace Moore, MD;  Location: [place];  Service: Neurosurgery;  Laterality: N/A;  . laser surgery for glaucoma Left Sep 21, 2014  . MULTIPLE TOOTH EXTRACTIONS    . ROTATOR CUFF REPAIR     x 2 /right shoulder  . SKIN CANCER EXCISION     pre-melanoma / on face  . TEE WITHOUT CARDIOVERSION N/A 03/07/2016   Procedure: TRANSESOPHAGEAL ECHOCARDIOGRAM (TEE);  Surgeon: Adrian Prows, MD;  Location: [place];  Service: Cardiovascular;  Laterality: N/A;  . TOE SURGERY     rt foot     There were no vitals filed for this visit.   Subjective Assessment - 12/19/20 1000    Subjective I am still struggling to recover from all the activity of the company that I had.  Did try to do some stuff in the yard    Currently in Pain? Yes    Pain Score 3     Pain Location Back  [place] [place], [place], [address] Phone: [phone]   Fax:  [phone]  Physical Therapy Treatment  Patient Details  Name: Linda Mcgrath MRN: [id number] Date of Birth: 1932/11/21 Referring Provider (PT): [name]   Encounter Date: 12/19/2020   PT End of Session - 12/19/20 1150    Visit Number 24    Date for PT Re-Evaluation 01/07/21    PT Start Time 1000    PT Stop Time 1051    PT Time Calculation (min) 51 min    Activity Tolerance Patient tolerated treatment well    Behavior During Therapy [facility] for tasks assessed/performed           Past Medical History:  Diagnosis Date  . Allergic rhinitis   . Arthritis   . Asthma   . Cataract    Bil/lens implant  . Colon polyp 1999   small polyp  . Diverticulosis   . Dysrhythmia   . Fibromyalgia   . Hyperlipidemia   . Hypothyroidism   . Internal hemorrhoids   . Leg cramps   . Lichen planus   . Lung nodule    stable LUL 9 mm  . Menopausal syndrome   . Osteoarthritis   . UTI (urinary tract infection)     Past Surgical History:  Procedure Laterality Date  . Abd U/S  11/1998   negative  . Abd U/S  01/2001   gallbladder polyps  . ABDOMINAL HYSTERECTOMY  1975   total-fibroids  . APPENDECTOMY  1975  . BREAST BIOPSY     benign  . BREAST EXCISIONAL BIOPSY Left 1957  . CARDIOVERSION N/A 03/07/2016   Procedure: CARDIOVERSION;  Surgeon: Adrian Prows, MD;  Location: [place];  Service: Cardiovascular;  Laterality: N/A;  . CATARACT EXTRACTION     bilateral  . CHOLECYSTECTOMY    . COLONOSCOPY  1998   Diverticulosis; polyp\  . COLONOSCOPY  09/2002   Diverticulosis, hem  . COLONOSCOPY  12/2007   diverticulosis, polyp  . DEXA  10/2001   osteopenia  . ELECTROPHYSIOLOGIC STUDY N/A 04/03/2016   Procedure: A-Flutter Ablation;  Surgeon: Will Meredith Leeds, MD;  Location: [place] CV LAB;  Service: Cardiovascular;  Laterality: N/A;  .  ESOPHAGOGASTRODUODENOSCOPY  2004  . Hida scan  01/2001   Negative  . KNEE SURGERY     right knee / 11/2004  . LAMINECTOMY WITH POSTERIOR LATERAL ARTHRODESIS LEVEL 2 N/A 07/31/2017   Procedure: DECOMPRESSIVE LAMINECTOMY LUMABR FOUR-FIVE, LUMBAR FIVE-SACRAL ONE;  Surgeon: Eustace Moore, MD;  Location: [place];  Service: Neurosurgery;  Laterality: N/A;  . laser surgery for glaucoma Left Sep 21, 2014  . MULTIPLE TOOTH EXTRACTIONS    . ROTATOR CUFF REPAIR     x 2 /right shoulder  . SKIN CANCER EXCISION     pre-melanoma / on face  . TEE WITHOUT CARDIOVERSION N/A 03/07/2016   Procedure: TRANSESOPHAGEAL ECHOCARDIOGRAM (TEE);  Surgeon: Adrian Prows, MD;  Location: [place];  Service: Cardiovascular;  Laterality: N/A;  . TOE SURGERY     rt foot     There were no vitals filed for this visit.   Subjective Assessment - 12/19/20 1000    Subjective I am still struggling to recover from all the activity of the company that I had.  Did try to do some stuff in the yard    Currently in Pain? Yes    Pain Score 3     Pain Location Back  Diagnosis Date Noted  . Chronic  upper back pain 08/30/2020  . Aortic atherosclerosis ([place]) 05/31/2020  . Abnormal CT scan, small bowel 05/18/2020  . Generalized abdominal pain 05/17/2020  . Duodenitis 03/13/2020  . Right lateral abdominal pain 03/07/2020  . Insomnia 12/15/2019  . Chronic low back pain 10/18/2019  . Positive depression screening 10/18/2019  . Osteoarthritis of right knee 08/02/2019  . Diarrhea 07/03/2018  . Dyspepsia 07/03/2018  . S/P lumbar spinal fusion 07/31/2017  . Right low back pain 03/04/2017  . H/O atrial flutter 03/06/2016  . Obesity 11/06/2015  . Urge incontinence 01/24/2015  . Estrogen deficiency 10/25/2014  . Lichen planus 99/83/3825  . Encounter for Medicare annual wellness exam 06/17/2013  . Screening mammogram, encounter for 06/15/2012  . Prediabetes 06/03/2011  . HYPERTENSION, BENIGN ESSENTIAL 10/23/2010  . OSTEOARTHRITIS, HANDS, BILATERAL 01/24/2010  . Hypothyroidism 08/24/2008  . Vitamin D deficiency 06/06/2008  . Hyperlipidemia 06/06/2008  . MENOPAUSAL SYNDROME 03/29/2008  . Osteopenia 03/29/2008  . ALLERGIC RHINITIS 01/25/2008  . IBS 01/25/2008  . OSTEOARTHRITIS 01/25/2008  . FIBROMYALGIA 01/25/2008    [name], PT 12/19/2020, 11:53 AM  [place] [place], [place], [address] Phone: [phone]   Fax:  [phone]  Name: Linda Mcgrath MRN: [id number] Date of Birth: Jan 14, 1933

## 2020-12-21 ENCOUNTER — Other Ambulatory Visit: Payer: Self-pay

## 2020-12-21 ENCOUNTER — Encounter: Payer: Self-pay | Admitting: Physical Therapy

## 2020-12-21 ENCOUNTER — Ambulatory Visit: Payer: Medicare Other | Admitting: Physical Therapy

## 2020-12-21 DIAGNOSIS — M62830 Muscle spasm of back: Secondary | ICD-10-CM | POA: Diagnosis not present

## 2020-12-21 DIAGNOSIS — M545 Low back pain, unspecified: Secondary | ICD-10-CM | POA: Diagnosis not present

## 2020-12-21 DIAGNOSIS — R262 Difficulty in walking, not elsewhere classified: Secondary | ICD-10-CM

## 2020-12-21 DIAGNOSIS — M542 Cervicalgia: Secondary | ICD-10-CM | POA: Diagnosis not present

## 2020-12-21 NOTE — Therapy (Signed)
[place] [place], [place], [address] Phone: [phone]   Fax:  [phone]  Physical Therapy Treatment  Patient Details  Name: Linda Mcgrath MRN: [id number] Date of Birth: [birth year] Referring Provider (PT): [name]   Encounter Date: 12/21/2020   PT End of Session - 12/21/20 1035    Visit Number 25    Date for PT Re-Evaluation 01/07/21    PT Start Time 0954    PT Stop Time 1040    PT Time Calculation (min) 46 min    Activity Tolerance Patient tolerated treatment well    Behavior During Therapy [facility] for tasks assessed/performed           Past Medical History:  Diagnosis Date  . Allergic rhinitis   . Arthritis   . Asthma   . Cataract    Bil/lens implant  . Colon polyp 1999   small polyp  . Diverticulosis   . Dysrhythmia   . Fibromyalgia   . Hyperlipidemia   . Hypothyroidism   . Internal hemorrhoids   . Leg cramps   . Lichen planus   . Lung nodule    stable LUL 9 mm  . Menopausal syndrome   . Osteoarthritis   . UTI (urinary tract infection)     Past Surgical History:  Procedure Laterality Date  . Abd U/S  11/1998   negative  . Abd U/S  01/2001   gallbladder polyps  . ABDOMINAL HYSTERECTOMY  1975   total-fibroids  . APPENDECTOMY  1975  . BREAST BIOPSY     benign  . BREAST EXCISIONAL BIOPSY Left 1957  . CARDIOVERSION N/A 03/07/2016   Procedure: CARDIOVERSION;  Surgeon: Adrian Prows, MD;  Location: [place];  Service: Cardiovascular;  Laterality: N/A;  . CATARACT EXTRACTION     bilateral  . CHOLECYSTECTOMY    . COLONOSCOPY  1998   Diverticulosis; polyp\  . COLONOSCOPY  09/2002   Diverticulosis, hem  . COLONOSCOPY  12/2007   diverticulosis, polyp  . DEXA  10/2001   osteopenia  . ELECTROPHYSIOLOGIC STUDY N/A 04/03/2016   Procedure: A-Flutter Ablation;  Surgeon: Will Meredith Leeds, MD;  Location: [place] CV [facility];  Service: Cardiovascular;  Laterality: N/A;  .  ESOPHAGOGASTRODUODENOSCOPY  2004  . Hida scan  01/2001   Negative  . KNEE SURGERY     right knee / 11/2004  . LAMINECTOMY WITH POSTERIOR LATERAL ARTHRODESIS LEVEL 2 N/A 07/31/2017   Procedure: DECOMPRESSIVE LAMINECTOMY LUMABR FOUR-FIVE, LUMBAR FIVE-SACRAL ONE;  Surgeon: Eustace Moore, MD;  Location: [place];  Service: Neurosurgery;  Laterality: N/A;  . laser surgery for glaucoma Left Sep 21, 2014  . MULTIPLE TOOTH EXTRACTIONS    . ROTATOR CUFF REPAIR     x 2 /right shoulder  . SKIN CANCER EXCISION     pre-melanoma / on face  . TEE WITHOUT CARDIOVERSION N/A 03/07/2016   Procedure: TRANSESOPHAGEAL ECHOCARDIOGRAM (TEE);  Surgeon: Adrian Prows, MD;  Location: [place];  Service: Cardiovascular;  Laterality: N/A;  . TOE SURGERY     rt foot     There were no vitals filed for this visit.   Subjective Assessment - 12/21/20 1032    Subjective Patient reports knee pain, unsure why    Currently in Pain? Yes    Pain Score 3     Pain Location Knee    Pain Orientation Right  annual wellness exam 06/17/2013  . Screening mammogram, encounter for 06/15/2012  . Prediabetes 06/03/2011  . HYPERTENSION, BENIGN ESSENTIAL 10/23/2010  . OSTEOARTHRITIS, HANDS, BILATERAL 01/24/2010  . Hypothyroidism 08/24/2008  . Vitamin D deficiency 06/06/2008  . Hyperlipidemia 06/06/2008  . MENOPAUSAL SYNDROME 03/29/2008  . Osteopenia 03/29/2008  . ALLERGIC RHINITIS 01/25/2008  . IBS 01/25/2008  . OSTEOARTHRITIS 01/25/2008  . FIBROMYALGIA 01/25/2008    [name], PT 12/21/2020, 10:40 AM  [place]. [place], [place], [address] Phone: [phone]   Fax:  [phone]  Name: Linda Mcgrath MRN: [id number] Date of Birth: [birth year]  [place] [place], [place], [address] Phone: [phone]   Fax:  [phone]  Physical Therapy Treatment  Patient Details  Name: Linda Mcgrath MRN: [id number] Date of Birth: [birth year] Referring Provider (PT): [name]   Encounter Date: 12/21/2020   PT End of Session - 12/21/20 1035    Visit Number 25    Date for PT Re-Evaluation 01/07/21    PT Start Time 0954    PT Stop Time 1040    PT Time Calculation (min) 46 min    Activity Tolerance Patient tolerated treatment well    Behavior During Therapy [facility] for tasks assessed/performed           Past Medical History:  Diagnosis Date  . Allergic rhinitis   . Arthritis   . Asthma   . Cataract    Bil/lens implant  . Colon polyp 1999   small polyp  . Diverticulosis   . Dysrhythmia   . Fibromyalgia   . Hyperlipidemia   . Hypothyroidism   . Internal hemorrhoids   . Leg cramps   . Lichen planus   . Lung nodule    stable LUL 9 mm  . Menopausal syndrome   . Osteoarthritis   . UTI (urinary tract infection)     Past Surgical History:  Procedure Laterality Date  . Abd U/S  11/1998   negative  . Abd U/S  01/2001   gallbladder polyps  . ABDOMINAL HYSTERECTOMY  1975   total-fibroids  . APPENDECTOMY  1975  . BREAST BIOPSY     benign  . BREAST EXCISIONAL BIOPSY Left 1957  . CARDIOVERSION N/A 03/07/2016   Procedure: CARDIOVERSION;  Surgeon: Adrian Prows, MD;  Location: [place];  Service: Cardiovascular;  Laterality: N/A;  . CATARACT EXTRACTION     bilateral  . CHOLECYSTECTOMY    . COLONOSCOPY  1998   Diverticulosis; polyp\  . COLONOSCOPY  09/2002   Diverticulosis, hem  . COLONOSCOPY  12/2007   diverticulosis, polyp  . DEXA  10/2001   osteopenia  . ELECTROPHYSIOLOGIC STUDY N/A 04/03/2016   Procedure: A-Flutter Ablation;  Surgeon: Will Meredith Leeds, MD;  Location: [place] [facility];  Service: Cardiovascular;  Laterality: N/A;  .  ESOPHAGOGASTRODUODENOSCOPY  2004  . Hida scan  01/2001   Negative  . KNEE SURGERY     right knee / 11/2004  . LAMINECTOMY WITH POSTERIOR LATERAL ARTHRODESIS LEVEL 2 N/A 07/31/2017   Procedure: DECOMPRESSIVE LAMINECTOMY LUMABR FOUR-FIVE, LUMBAR FIVE-SACRAL ONE;  Surgeon: Eustace Moore, MD;  Location: [place];  Service: Neurosurgery;  Laterality: N/A;  . laser surgery for glaucoma Left Sep 21, 2014  . MULTIPLE TOOTH EXTRACTIONS    . ROTATOR CUFF REPAIR     x 2 /right shoulder  . SKIN CANCER EXCISION     pre-melanoma / on face  . TEE WITHOUT CARDIOVERSION N/A 03/07/2016   Procedure: TRANSESOPHAGEAL ECHOCARDIOGRAM (TEE);  Surgeon: Adrian Prows, MD;  Location: [place];  Service: Cardiovascular;  Laterality: N/A;  . TOE SURGERY     rt foot     There were no vitals filed for this visit.   Subjective Assessment - 12/21/20 1032    Subjective Patient reports knee pain, unsure why    Currently in Pain? Yes    Pain Score 3     Pain Location Knee    Pain Orientation Right

## 2020-12-25 ENCOUNTER — Other Ambulatory Visit: Payer: Self-pay | Admitting: Family Medicine

## 2020-12-25 DIAGNOSIS — Z1231 Encounter for screening mammogram for malignant neoplasm of breast: Secondary | ICD-10-CM

## 2021-01-03 ENCOUNTER — Telehealth: Payer: Self-pay | Admitting: Family Medicine

## 2021-01-03 MED ORDER — SOLIFENACIN SUCCINATE 5 MG PO TABS: 5.0000 mg | ORAL_TABLET | Freq: Every day | ORAL | 11 refills | Status: DC

## 2021-01-03 NOTE — Telephone Encounter (Signed)
Written req from pt for vesicare 5  She had a sample and it helped (10 mg) with some eye/throat dryness  Would like to try 5 mg  [facility] [place]  Let her know I sent it

## 2021-01-03 NOTE — Telephone Encounter (Signed)
Pt notified Rx sent 

## 2021-02-09 DIAGNOSIS — H401131 Primary open-angle glaucoma, bilateral, mild stage: Secondary | ICD-10-CM | POA: Diagnosis not present

## 2021-02-13 ENCOUNTER — Other Ambulatory Visit: Payer: Self-pay

## 2021-02-13 ENCOUNTER — Ambulatory Visit
Admission: RE | Admit: 2021-02-13 | Discharge: 2021-02-13 | Disposition: A | Discharge status: Home / Self Care | Payer: Medicare Other | Source: Ambulatory Visit | Attending: Family Medicine | Admitting: Family Medicine

## 2021-02-13 DIAGNOSIS — Z1231 Encounter for screening mammogram for malignant neoplasm of breast: Secondary | ICD-10-CM

## 2021-03-01 DIAGNOSIS — H35372 Puckering of macula, left eye: Secondary | ICD-10-CM | POA: Diagnosis not present

## 2021-03-01 DIAGNOSIS — H33012 Retinal detachment with single break, left eye: Secondary | ICD-10-CM | POA: Diagnosis not present

## 2021-03-01 DIAGNOSIS — H33313 Horseshoe tear of retina without detachment, bilateral: Secondary | ICD-10-CM | POA: Diagnosis not present

## 2021-03-01 DIAGNOSIS — H33311 Horseshoe tear of retina without detachment, right eye: Secondary | ICD-10-CM | POA: Diagnosis not present

## 2021-03-01 DIAGNOSIS — H31092 Other chorioretinal scars, left eye: Secondary | ICD-10-CM | POA: Diagnosis not present

## 2021-03-16 ENCOUNTER — Ambulatory Visit
Admission: EM | Admit: 2021-03-16 | Discharge: 2021-03-16 | Disposition: A | Discharge status: Home / Self Care | Payer: Medicare Other | Attending: Emergency Medicine | Admitting: Emergency Medicine

## 2021-03-16 ENCOUNTER — Other Ambulatory Visit: Payer: Self-pay

## 2021-03-16 ENCOUNTER — Telehealth: Payer: Self-pay

## 2021-03-16 ENCOUNTER — Encounter: Payer: Self-pay | Admitting: Emergency Medicine

## 2021-03-16 DIAGNOSIS — J019 Acute sinusitis, unspecified: Secondary | ICD-10-CM | POA: Diagnosis not present

## 2021-03-16 MED ORDER — BENZONATATE 200 MG PO CAPS: 200.0000 mg | ORAL_CAPSULE | Freq: Three times a day (TID) | ORAL | 0 refills | Status: AC | PRN

## 2021-03-16 MED ORDER — AZITHROMYCIN 250 MG PO TABS: ORAL_TABLET | ORAL | 0 refills | Status: DC

## 2021-03-16 MED ORDER — DM-GUAIFENESIN ER 30-600 MG PO TB12: 1.0000 | ORAL_TABLET | Freq: Two times a day (BID) | ORAL | 0 refills | Status: DC

## 2021-03-16 MED ORDER — MUPIROCIN 2 % EX OINT: 1.0000 "application " | TOPICAL_OINTMENT | Freq: Two times a day (BID) | CUTANEOUS | 0 refills | Status: DC

## 2021-03-16 NOTE — ED Triage Notes (Signed)
For about 2 weeks has been having phlegm in throat. Mucous is yellow/green color.   Right arm laceration that happened 2 days ago.

## 2021-03-16 NOTE — ED Provider Notes (Signed)
[facility]    CSN: [id number] Arrival date & time: 03/16/21  1137      History   Chief Complaint Chief Complaint  Patient presents with   Laceration   URI    HPI Linda Mcgrath is a 87 y.o. female history of osteoarthritis, lung nodule, hyperlipidemia presenting today for evaluation of URI symptoms and skin tear.  Patient reports that over the past 2 weeks she has had increased sinus congestion postnasal drainage and thick mucus in her throat with associated cough.  Denies significant chest pain or shortness of breath.  Denies fevers.  Energy level at baseline.  She also reports 2 days ago tripped and fell and caused skin tear to her right forearm.  She denies hitting head.  Denies difficulty bending or moving her arm.  HPI  Past Medical History:  Diagnosis Date   Allergic rhinitis    Arthritis    Asthma    Cataract    Bil/lens implant   Colon polyp 1999   small polyp   Diverticulosis    Dysrhythmia    Fibromyalgia    Hyperlipidemia    Hypothyroidism    Internal hemorrhoids    Leg cramps    Lichen planus    Lung nodule    stable LUL 9 mm   Menopausal syndrome    Osteoarthritis    UTI (urinary tract infection)     Patient Active Problem List   Diagnosis Date Noted   Chronic upper back pain 08/30/2020   Aortic atherosclerosis ([place]) 05/31/2020   Abnormal CT scan, small bowel 05/18/2020   Generalized abdominal pain 05/17/2020   Duodenitis 03/13/2020   Right lateral abdominal pain 03/07/2020   Insomnia 12/15/2019   Chronic low back pain 10/18/2019   Positive depression screening 10/18/2019   Osteoarthritis of right knee 08/02/2019   Diarrhea 07/03/2018   Dyspepsia 07/03/2018   S/P lumbar spinal fusion 07/31/2017   Right low back pain 03/04/2017   H/O atrial flutter 03/06/2016   Obesity 11/06/2015   Urge incontinence 01/24/2015   Estrogen deficiency 99991111   Lichen planus [id number]   Encounter for Medicare annual wellness exam  06/17/2013   Screening mammogram, encounter for 06/15/2012   Prediabetes 06/03/2011   HYPERTENSION, BENIGN ESSENTIAL 10/23/2010   OSTEOARTHRITIS, HANDS, BILATERAL 01/24/2010   Hypothyroidism 08/24/2008   Vitamin D deficiency 06/06/2008   Hyperlipidemia 06/06/2008   MENOPAUSAL SYNDROME 03/29/2008   Osteopenia 03/29/2008   ALLERGIC RHINITIS 01/25/2008   IBS 01/25/2008   OSTEOARTHRITIS 01/25/2008   FIBROMYALGIA 01/25/2008    Past Surgical History:  Procedure Laterality Date   Abd U/S  11/1998   negative   Abd U/S  01/2001   gallbladder polyps   ABDOMINAL HYSTERECTOMY  1975   total-fibroids   APPENDECTOMY  1975   BREAST BIOPSY     benign   BREAST EXCISIONAL BIOPSY Left 1957   CARDIOVERSION N/A 03/07/2016   Procedure: CARDIOVERSION;  Surgeon: Adrian Prows, MD;  Location: [place];  Service: Cardiovascular;  Laterality: N/A;   CATARACT EXTRACTION     bilateral   CHOLECYSTECTOMY     COLONOSCOPY  1998   Diverticulosis; polyp\   COLONOSCOPY  09/2002   Diverticulosis, hem   COLONOSCOPY  12/2007   diverticulosis, polyp   DEXA  10/2001   osteopenia   ELECTROPHYSIOLOGIC STUDY N/A 04/03/2016   Procedure: A-Flutter Ablation;  Surgeon: Will Meredith Leeds, MD;  Location: [place] [facility];  Service: Cardiovascular;  Laterality: N/A;   ESOPHAGOGASTRODUODENOSCOPY  2004  [place] scan  01/2001   Negative   KNEE SURGERY     right knee / 11/2004   LAMINECTOMY WITH POSTERIOR LATERAL ARTHRODESIS LEVEL 2 N/A 07/31/2017   Procedure: DECOMPRESSIVE LAMINECTOMY LUMABR FOUR-FIVE, LUMBAR FIVE-SACRAL ONE;  Surgeon: Eustace Moore, MD;  Location: [place];  Service: Neurosurgery;  Laterality: N/A;   laser surgery for glaucoma Left Sep 21, 2014   MULTIPLE TOOTH EXTRACTIONS     ROTATOR CUFF REPAIR     x 2 /right shoulder   SKIN CANCER EXCISION     pre-melanoma / on face   TEE WITHOUT CARDIOVERSION N/A 03/07/2016   Procedure: TRANSESOPHAGEAL ECHOCARDIOGRAM (TEE);  Surgeon: Adrian Prows, MD;  Location[facility]  ENDOSCOPY;  Service: Cardiovascular;  Laterality: N/A;   TOE SURGERY     rt foot     OB History   No obstetric history on file.      Home Medications    Prior to Admission medications   Medication Sig Start Date End Date Taking? Authorizing Provider  azithromycin (ZITHROMAX) 250 MG tablet Take first 2 tablets together, then 1 every day until finished. 03/16/21  Yes Alanta Scobey C, PA-C  benzonatate (TESSALON) 200 MG capsule Take 1 capsule (200 mg total) by mouth 3 (three) times daily as needed for up to 7 days for cough. 03/16/21 03/23/21 Yes Vinie Charity C, PA-C  dextromethorphan-guaiFENesin (MUCINEX DM) 30-600 MG 12hr tablet Take 1 tablet by mouth 2 (two) times daily. 03/16/21  Yes Lashondra Vaquerano C, PA-C  mupirocin ointment (BACTROBAN) 2 % Apply 1 application topically 2 (two) times daily. 03/16/21  Yes Shakina Choy C, PA-C  Ascorbic Acid (VITAMIN C PO) Take 500 mg by mouth daily.    [provider]  B Complex Vitamins (VITAMIN B COMPLEX PO) Take 1 tablet by mouth daily.    [provider]  B COMPLEX, FOLIC ACID, PO Take XX123456 mcg by mouth daily.    [provider]  Biotin 10 MG TABS Take 1 tablet by mouth every morning.     [provider]  Calcium Citrate-Vitamin D 250-200 MG-UNIT TABS Take 1 tablet by mouth daily.    [provider]  cholestyramine (QUESTRAN) 4 g packet Take 1 packet (4 g total) by mouth daily. 07/05/20   Irene Shipper, MD  Cranberry 180 MG CAPS Take 2 capsules by mouth daily.    [provider]  famotidine (PEPCID) 20 MG tablet Take 1 tablet (20 mg total) by mouth 2 (two) times daily. 07/03/18   [name], Wynelle Fanny, MD  Fexofenadine HCl [facility] ALLERGY PO) Take 1 tablet by mouth daily as needed (Allergies).     [provider]  folic acid (FOLVITE) 1 MG tablet Take 1 mg by mouth daily.    [provider]  levothyroxine (SYNTHROID) 88 MCG tablet Take 1 tablet (88 mcg total) by mouth daily with  breakfast. 08/31/20   [name], Wynelle Fanny, MD  loratadine (CLARITIN) 10 MG tablet Take 10 mg by mouth daily as needed for allergies.     [provider]  MAGNESIUM-OXIDE PO Take 1 tablet by mouth daily.    [provider]  Methenamine-Sodium Salicylate (AZO URINARY TRACT DEFENSE PO) Take 1 capsule by mouth daily.    [provider]  methocarbamol (ROBAXIN) 500 MG tablet TAKE 1 TABLET (500 MG TOTAL) BY MOUTH EVERY 8 (EIGHT) HOURS AS NEEDED FOR MUSCLE SPASMS. CAUTION OF SEDATION 12/21/19   [name], Wynelle Fanny, MD  Multiple Vitamin (MULTIVITAMIN) capsule Take  1 capsule by mouth daily.    [provider]  Psyllium (METAMUCIL FIBER PO) Take by mouth at bedtime. 2 tablespoon    [provider]  Pumpkin Seed-Soy Germ (AZO BLADDER CONTROL/GO-LESS PO) Take 1 tablet by mouth 2 (two) times a week.    [provider]  solifenacin (VESICARE) 5 MG tablet Take 1 tablet (5 mg total) by mouth daily. 01/03/21   [name], Wynelle Fanny, MD  TURMERIC PO Take 1 capsule by mouth 2 (two) times daily.     [provider]  vitamin B-12 (CYANOCOBALAMIN) 500 MCG tablet Take 500 mcg by mouth daily.    [provider]  Vitamin D, Cholecalciferol, 50 MCG (2000 UT) CAPS Take 2 tablets by mouth daily.    [provider]    Family History Family History  Problem Relation Age of Onset   Parkinsonism Father    Colon cancer Brother    Allergies Mother    Heart disease Mother    Kidney failure Son        congenital  (kidney transplant)    Heart failure Son        died suddenly of CHF     Social History Social History   Tobacco Use   Smoking status: Never   Smokeless tobacco: Never  Vaping Use   Vaping Use: Never used  Substance Use Topics   Alcohol use: No    Alcohol/week: 0.0 standard drinks    Comment: occasional-rare   Drug use: No     Allergies   Shellfish allergy, Tetracycline, Amlodipine besylate, Ciprofloxacin, Codeine, Fosamax [alendronate  sodium], Propoxyphene hcl, Sulfamethoxazole-trimethoprim, Tape, Xarelto [rivaroxaban], Amoxicillin, Other, and Penicillins   Review of Systems Review of Systems  Constitutional:  Negative for activity change, appetite change, chills, fatigue and fever.  HENT:  Positive for congestion, rhinorrhea and sinus pressure. Negative for ear pain, sore throat and trouble swallowing.   Eyes:  Negative for discharge and redness.  Respiratory:  Positive for cough. Negative for chest tightness and shortness of breath.   Cardiovascular:  Negative for chest pain.  Gastrointestinal:  Negative for abdominal pain, diarrhea, nausea and vomiting.  Musculoskeletal:  Negative for myalgias.  Skin:  Positive for color change and wound. Negative for rash.  Neurological:  Negative for dizziness, light-headedness and headaches.    Physical Exam Triage Vital Signs ED Triage Vitals [03/16/21 1210]  Enc Vitals Group     BP (!) 144/88     Pulse Rate 81     Resp 18     Temp 98.6 F (37 C)     Temp Source Oral     SpO2 95 %     Weight 175 lb (79.4 kg)     Height      Head Circumference      Peak Flow      Pain Score 0     Pain Loc      Pain Edu?      Excl. in [place]?    No data found.  Updated Vital Signs BP (!) 144/88 (BP Location: Right Arm)   Pulse 81   Temp 98.6 F (37 C) (Oral)   Resp 18   Wt 175 lb (79.4 kg)   LMP 08/26/1969   SpO2 95%   BMI 31.00 kg/m   Visual Acuity Right Eye Distance:   Left Eye Distance:   Bilateral Distance:    Right Eye Near:   Left Eye Near:    Bilateral Near:  Physical Exam Vitals and nursing note reviewed.  Constitutional:      Appearance: She is well-developed.     Comments: No acute distress  HENT:     Head: Normocephalic and atraumatic.     Ears:     Comments: Bilateral ears without tenderness to palpation of external auricle, tragus and mastoid, EAC's without erythema or swelling, TM's with good bony landmarks and cone of light. Non  erythematous.      Nose: Nose normal.     Mouth/Throat:     Comments: Oral mucosa pink and moist, no tonsillar enlargement or exudate. Posterior pharynx patent and nonerythematous, no uvula deviation or swelling. Normal phonation.  Eyes:     Conjunctiva/sclera: Conjunctivae normal.  Cardiovascular:     Rate and Rhythm: Normal rate and regular rhythm.  Pulmonary:     Effort: Pulmonary effort is normal. No respiratory distress.     Comments: Breathing comfortably at rest, CTABL, no wheezing, rales or other adventitious sounds auscultated  Abdominal:     General: There is no distension.  Musculoskeletal:        General: Normal range of motion.     Cervical back: Neck supple.  Skin:    General: Skin is warm and dry.     Comments: Approximately 3 to 4 cm size skin tear noted to right forearm with mild surrounding erythema, wound appears overall dry  Neurological:     Mental Status: She is alert and oriented to person, place, and time.     UC Treatments / Results  Labs (all labs ordered are listed, but only abnormal results are displayed) Labs Reviewed - No data to display  EKG   Radiology No results found.  Procedures Procedures (including critical care time)  Medications Ordered in UC Medications - No data to display  Initial Impression / Assessment and Plan / UC Course  I have reviewed the triage vital signs and the nursing notes.  Pertinent labs & imaging results that were available during my care of the patient were reviewed by me and considered in my medical decision making (see chart for details).     Sinusitis-symptoms x2 weeks, has allergy to tetracyclines, penicillins with questionable anaphylaxis, will proceed with azithromycin course, Tessalon and Mucinex for congestion and cough Skin tear to right forearm-discussed wound care, Bactroban topically, gentle cleansing daily and monitor for gradual healing  Discussed strict return precautions. Patient  verbalized understanding and is agreeable with plan.  Final Clinical Impressions(s) / UC Diagnoses   Final diagnoses:  Acute sinusitis with symptoms > 10 days     Discharge Instructions      Begin azithromycin-2 tabs today, 1 tab for the following 4 days Mucinex DM twice daily for congestion/cough Tessalon every 8 hours as needed for further cough relief Bactroban around wound once or twice daily Cleanse wound with warm soapy water twice daily and dry well, allow wound to air out when at home Follow-up for any concerns regarding cough/congestion or wound not healing.      ED Prescriptions     Medication Sig Dispense Auth. Provider   benzonatate (TESSALON) 200 MG capsule Take 1 capsule (200 mg total) by mouth 3 (three) times daily as needed for up to 7 days for cough. 28 capsule Kaitland Lewellyn C, PA-C   dextromethorphan-guaiFENesin (MUCINEX DM) 30-600 MG 12hr tablet Take 1 tablet by mouth 2 (two) times daily. 15 tablet Christl Fessenden C, PA-C   mupirocin ointment (BACTROBAN) 2 % Apply 1 application topically 2 (  two) times daily. 30 g Manna Gose C, PA-C   azithromycin (ZITHROMAX) 250 MG tablet Take first 2 tablets together, then 1 every day until finished. 6 tablet Leeroy Lovings, [place] C, PA-C      PDMP not reviewed this encounter.   Janith Lima, [place] 03/16/21 1312

## 2021-03-16 NOTE — Discharge Instructions (Addendum)
Begin azithromycin-2 tabs today, 1 tab for the following 4 days Mucinex DM twice daily for congestion/cough Tessalon every 8 hours as needed for further cough relief Bactroban around wound once or twice daily Cleanse wound with warm soapy water twice daily and dry well, allow wound to air out when at home Follow-up for any concerns regarding cough/congestion or wound not healing.

## 2021-03-16 NOTE — Telephone Encounter (Signed)
Aware, will watch for correspondence  

## 2021-03-16 NOTE — Telephone Encounter (Signed)
Pt called asking for an appt with Dr [name] today for a cough. I advised her we did not have any appts available today and that our x-ray is down. I gave her info on [facility] at [place] and one at [facility]. She said she would probably go to [facility] [address]  FYI to Dr [name]

## 2021-03-23 DIAGNOSIS — H33322 Round hole, left eye: Secondary | ICD-10-CM | POA: Diagnosis not present

## 2021-05-02 ENCOUNTER — Other Ambulatory Visit: Payer: Self-pay

## 2021-05-02 ENCOUNTER — Encounter: Payer: Self-pay | Admitting: Family Medicine

## 2021-05-02 ENCOUNTER — Telehealth (INDEPENDENT_AMBULATORY_CARE_PROVIDER_SITE_OTHER): Payer: Medicare Other | Admitting: Family Medicine

## 2021-05-02 VITALS — Ht 63.0 in | Wt 175.0 lb

## 2021-05-02 DIAGNOSIS — M549 Dorsalgia, unspecified: Secondary | ICD-10-CM | POA: Diagnosis not present

## 2021-05-02 DIAGNOSIS — G8929 Other chronic pain: Secondary | ICD-10-CM | POA: Diagnosis not present

## 2021-05-02 DIAGNOSIS — J301 Allergic rhinitis due to pollen: Secondary | ICD-10-CM

## 2021-05-02 DIAGNOSIS — M175 Other unilateral secondary osteoarthritis of knee: Secondary | ICD-10-CM

## 2021-05-02 NOTE — Assessment & Plan Note (Signed)
R worse than L  Needs asst with stairs  PT helped her to the point where she could rise from a chair w/o help  Would like to continue PT (I agree)  Referral done This I suspect will further help mobility and independence

## 2021-05-02 NOTE — Progress Notes (Signed)
Virtual Visit via Video Note  I connected with Linda Mcgrath on 05/02/21 at 12:00 PM EDT by a video enabled telemedicine application and verified that I am speaking with the correct person using two identifiers. Video failed today and visit was done by phone  Location: Patient: home Provider: office   I discussed the limitations of evaluation and management by telemedicine and the availability of in person appointments. The patient expressed understanding and agreed to proceed.  Parties involved in encounter  [place]  Provider:  Loura Pardon MD   History of Present Illness: Pt presents with cough and nasal congestion  Also needs another ref to PT -helps tremendously (back pain)  Mucous in throat early in am , dark yellow Was tx for sinusitis in July -zithromax  No fever  No sob  No sinus pain  Most of her congestion is in the am  Has pnd  No cough    She takes zyrtec 10 mg daily -helps with allergies  No nasal spray currently   Chronic upper back pain  Bilat knee pain - from OA  Sees Lum Babe  at [facility] -has worked with him for years   She can stand from sitting if she leans forward enough  Uses a cane on the R side (worse side)   Back bothers her with vacuuming and watering flowers Very hard to bend over and pick things up  Back pain is worse on the R side  She is Right handed    Had eye surgery -vision is 20/20 and she is thrilled   Patient Active Problem List   Diagnosis Date Noted   Chronic upper back pain 08/30/2020   Aortic atherosclerosis ([place]) 05/31/2020   Abnormal CT scan, small bowel 05/18/2020   Generalized abdominal pain 05/17/2020   Duodenitis 03/13/2020   Right lateral abdominal pain 03/07/2020   Insomnia 12/15/2019   Chronic low back pain 10/18/2019   Positive depression screening 10/18/2019   Osteoarthritis of right knee 08/02/2019   Diarrhea 07/03/2018   Dyspepsia 07/03/2018   S/P lumbar  spinal fusion 07/31/2017   Right low back pain 03/04/2017   H/O atrial flutter 03/06/2016   Obesity 11/06/2015   Urge incontinence 01/24/2015   Estrogen deficiency 99991111   Lichen planus [id number]   Encounter for Medicare annual wellness exam 06/17/2013   Screening mammogram, encounter for 06/15/2012   Prediabetes 06/03/2011   HYPERTENSION, BENIGN ESSENTIAL 10/23/2010   OSTEOARTHRITIS, HANDS, BILATERAL 01/24/2010   Hypothyroidism 08/24/2008   Vitamin D deficiency 06/06/2008   Hyperlipidemia 06/06/2008   MENOPAUSAL SYNDROME 03/29/2008   Osteopenia 03/29/2008   Allergic rhinitis 01/25/2008   IBS 01/25/2008   OSTEOARTHRITIS 01/25/2008   FIBROMYALGIA 01/25/2008   Past Medical History:  Diagnosis Date   Allergic rhinitis    Arthritis    Asthma    Cataract    Bil/lens implant   Colon polyp 1999   small polyp   Diverticulosis    Dysrhythmia    Fibromyalgia    Hyperlipidemia    Hypothyroidism    Internal hemorrhoids    Leg cramps    Lichen planus    Lung nodule    stable LUL 9 mm   Menopausal syndrome    Osteoarthritis    UTI (urinary tract infection)    Past Surgical History:  Procedure Laterality Date   Abd U/S  11/1998   negative   Abd U/S  01/2001   gallbladder polyps   ABDOMINAL HYSTERECTOMY  1975  total-fibroids   APPENDECTOMY  1975   BREAST BIOPSY     benign   BREAST EXCISIONAL BIOPSY Left 1957   CARDIOVERSION N/A 03/07/2016   Procedure: CARDIOVERSION;  Surgeon: Adrian Prows, MD;  Location: [facility];  Service: Cardiovascular;  Laterality: N/A;   CATARACT EXTRACTION     bilateral   CHOLECYSTECTOMY     COLONOSCOPY  1998   Diverticulosis; polyp\   COLONOSCOPY  09/2002   Diverticulosis, hem   COLONOSCOPY  12/2007   diverticulosis, polyp   DEXA  10/2001   osteopenia   ELECTROPHYSIOLOGIC STUDY N/A 04/03/2016   Procedure: A-Flutter Ablation;  Surgeon: Will Meredith Leeds, MD;  Location: [facility];  Service: Cardiovascular;  Laterality: N/A;    ESOPHAGOGASTRODUODENOSCOPY  2004   Hida scan  01/2001   Negative   KNEE SURGERY     right knee / 11/2004   LAMINECTOMY WITH POSTERIOR LATERAL ARTHRODESIS LEVEL 2 N/A 07/31/2017   Procedure: DECOMPRESSIVE LAMINECTOMY LUMABR FOUR-FIVE, LUMBAR FIVE-SACRAL ONE;  Surgeon: Eustace Moore, MD;  Location: [facility];  Service: Neurosurgery;  Laterality: N/A;   laser surgery for glaucoma Left Sep 21, 2014   MULTIPLE TOOTH EXTRACTIONS     ROTATOR CUFF REPAIR     x 2 /right shoulder   SKIN CANCER EXCISION     pre-melanoma / on face   TEE WITHOUT CARDIOVERSION N/A 03/07/2016   Procedure: TRANSESOPHAGEAL ECHOCARDIOGRAM (TEE);  Surgeon: Adrian Prows, MD;  Location: [facility] END[facility];  Service: Cardiovascular;  Laterality: N/A;   TOE SURGERY     rt foot    Social History   Tobacco Use   Smoking status: Never   Smokeless tobacco: Never  Vaping Use   Vaping Use: Never used  Substance Use Topics   Alcohol use: No    Alcohol/week: 0.0 standard drinks    Comment: occasional-rare   Drug use: No   Family History  Problem Relation Age of Onset   Parkinsonism Father    Colon cancer Brother    Allergies Mother    Heart disease Mother    Kidney failure Son        congenital  (kidney transplant)    Heart failure Son        died suddenly of CHF    Allergies  Allergen Reactions   Shellfish Allergy Anaphylaxis and Nausea And Vomiting   Tetracycline Hives, Nausea And Vomiting, Rash and Other (See Comments)    SYSTEMIC REACTION: heart palpitations,muscle spasms, vomiting   Amlodipine Besylate Other (See Comments)    REACTION: muscle spasms, extremity swelling, low bp   Ciprofloxacin Other (See Comments)    REACTION: muscle spasms, insomnia   Codeine Other (See Comments)    REACTION: hallucinations   Fosamax [Alendronate Sodium]    Propoxyphene Hcl Other (See Comments)    Unknown   Sulfamethoxazole-Trimethoprim Nausea Only and Other (See Comments)    REACTION: dizzy, could not focus eyes and could not  concentrate and nausea   Tape Other (See Comments)    BANDAIDS TAKE OFF HER SKIN; PLEASE USE COBAN OR PAPER    Xarelto [Rivaroxaban] Other (See Comments)    Aches and pains and nervousness   Amoxicillin Rash   Other Swelling, Rash and Other (See Comments)    Has autoimmune disorder and spices erode her salivary glands and affects ankles and mouth DIRECTLY (takes off 1st layer of skin and leaves her unable to walk)   Penicillins Swelling, Rash and Other (See Comments)    Has patient  had a PCN reaction causing immediate rash, facial/tongue/throat swelling, SOB or lightheadedness with hypotension: Yes Has patient had a PCN reaction causing severe rash involving mucus membranes or skin necrosis: No Has patient had a PCN reaction that required hospitalization No Has patient had a PCN reaction occurring within the last 10 years: No If all of the above answers are "NO", then may proceed with Cephalosporin use.    Current Outpatient Medications on File Prior to Visit  Medication Sig Dispense Refill   Ascorbic Acid (VITAMIN C PO) Take 500 mg by mouth daily.     B Complex Vitamins (VITAMIN B COMPLEX PO) Take 1 tablet by mouth daily.     B COMPLEX, FOLIC ACID, PO Take XX123456 mcg by mouth daily.     Biotin 10 MG TABS Take 1 tablet by mouth every morning.      Calcium Citrate-Vitamin D 250-200 MG-UNIT TABS Take 1 tablet by mouth daily.     cholestyramine (QUESTRAN) 4 g packet Take 1 packet (4 g total) by mouth daily. 60 each 5   Cranberry 180 MG CAPS Take 2 capsules by mouth daily.     dextromethorphan-guaiFENesin (MUCINEX DM) 30-600 MG 12hr tablet Take 1 tablet by mouth 2 (two) times daily. 15 tablet 0   famotidine (PEPCID) 20 MG tablet Take 1 tablet (20 mg total) by mouth 2 (two) times daily. 60 tablet 3   Fexofenadine HCl (MUCINEX ALLERGY PO) Take 1 tablet by mouth daily as needed (Allergies).      folic acid (FOLVITE) 1 MG tablet Take 1 mg by mouth daily.     levothyroxine (SYNTHROID) 88 MCG tablet  Take 1 tablet (88 mcg total) by mouth daily with breakfast. 90 tablet 3   loratadine (CLARITIN) 10 MG tablet Take 10 mg by mouth daily as needed for allergies.      MAGNESIUM-OXIDE PO Take 1 tablet by mouth daily.     Methenamine-Sodium Salicylate (AZO URINARY TRACT DEFENSE PO) Take 1 capsule by mouth daily.     methocarbamol (ROBAXIN) 500 MG tablet TAKE 1 TABLET (500 MG TOTAL) BY MOUTH EVERY 8 (EIGHT) HOURS AS NEEDED FOR MUSCLE SPASMS. CAUTION OF SEDATION 30 tablet 1   Multiple Vitamin (MULTIVITAMIN) capsule Take 1 capsule by mouth daily.     mupirocin ointment (BACTROBAN) 2 % Apply 1 application topically 2 (two) times daily. 30 g 0   Psyllium (METAMUCIL FIBER PO) Take by mouth at bedtime. 2 tablespoon     Pumpkin Seed-Soy Germ (AZO BLADDER CONTROL/GO-LESS PO) Take 1 tablet by mouth 2 (two) times a week.     solifenacin (VESICARE) 5 MG tablet Take 1 tablet (5 mg total) by mouth daily. 30 tablet 11   TURMERIC PO Take 1 capsule by mouth 2 (two) times daily.      vitamin B-12 (CYANOCOBALAMIN) 500 MCG tablet Take 500 mcg by mouth daily.     Vitamin D, Cholecalciferol, 50 MCG (2000 UT) CAPS Take 2 tablets by mouth daily.     No current facility-administered medications on file prior to visit.   Review of Systems  Constitutional:  Negative for chills, fever and malaise/fatigue.  HENT:  Negative for congestion, ear pain, nosebleeds, sinus pain and sore throat.        Pnd in am with yellow mucous  Eyes:  Negative for blurred vision, discharge and redness.  Respiratory:  Negative for cough, shortness of breath and stridor.   Cardiovascular:  Negative for chest pain, palpitations and leg swelling.  Gastrointestinal:  Negative for abdominal pain, diarrhea, nausea and vomiting.  Musculoskeletal:  Positive for back pain and joint pain. Negative for myalgias.  Skin:  Negative for rash.  Neurological:  Negative for dizziness and headaches.   Observations/Objective: Pt sounds well  Not hoarse  Does  not sound congested No cough or wheeze while talking Nl mood Nl cognition, good historian  Assessment and Plan: Problem List Items Addressed This Visit       Respiratory   Allergic rhinitis - Primary    Pt has pnd with some yellow mucous only in ams  occ mild congestion after going outside No sinus pain or pressure  Adv use of nasal saline Zyrtec for rhinorrhea if helpful Get back on daily flonase through allergy season Update if not starting to improve in a week or if worsening          Musculoskeletal and Integument   Osteoarthritis of right knee    R worse than L  Needs asst with stairs  PT helped her to the point where she could rise from a chair w/o help  Would like to continue PT (I agree)  Referral done This I suspect will further help mobility and independence       Relevant Orders   Ambulatory referral to Physical Therapy     Other   Chronic upper back pain    Some improvement with PT and would like to continue it  Pain and stiffness is worse on the R Goal is for better mobility to help independence so she can do household tasks Referral done      Relevant Orders   Ambulatory referral to Physical Therapy     Follow Up Instructions: Continue zyrtec if it helps Get back on flonase once daily through your allergy season  Watch for sinus pain or pressure (let us know)  Nasal saline spray may help first thing in the am   I placed a referral to PT, you will get a call   I discussed the assessment and treatment plan with the patient. The patient was provided an opportunity to ask questions and all were answered. The patient agreed with the plan and demonstrated an understanding of the instructions.   The patient was advised to call back or seek an in-person evaluation if the symptoms worsen or if the condition fails to improve as anticipated.  I provided 16 minutes of non-face-to-face time during this encounter.   Loura Pardon, MD

## 2021-05-02 NOTE — Patient Instructions (Signed)
Continue zyrtec if it helps Get back on flonase once daily through your allergy season  Watch for sinus pain or pressure (let us know)  Nasal saline spray may help first thing in the am   I placed a referral to PT, you will get a call

## 2021-05-02 NOTE — Assessment & Plan Note (Signed)
Pt has pnd with some yellow mucous only in ams  occ mild congestion after going outside No sinus pain or pressure  Adv use of nasal saline Zyrtec for rhinorrhea if helpful Get back on daily flonase through allergy season Update if not starting to improve in a week or if worsening

## 2021-05-02 NOTE — Assessment & Plan Note (Signed)
Some improvement with PT and would like to continue it  Pain and stiffness is worse on the R Goal is for better mobility to help independence so she can do household tasks Referral done

## 2021-05-03 DIAGNOSIS — H35361 Drusen (degenerative) of macula, right eye: Secondary | ICD-10-CM | POA: Diagnosis not present

## 2021-05-03 DIAGNOSIS — Z961 Presence of intraocular lens: Secondary | ICD-10-CM | POA: Diagnosis not present

## 2021-05-03 DIAGNOSIS — H4423 Degenerative myopia, bilateral: Secondary | ICD-10-CM | POA: Diagnosis not present

## 2021-05-03 DIAGNOSIS — H31092 Other chorioretinal scars, left eye: Secondary | ICD-10-CM | POA: Diagnosis not present

## 2021-05-03 DIAGNOSIS — H33312 Horseshoe tear of retina without detachment, left eye: Secondary | ICD-10-CM | POA: Diagnosis not present

## 2021-05-17 ENCOUNTER — Encounter: Payer: Self-pay | Admitting: Physical Therapy

## 2021-05-17 ENCOUNTER — Ambulatory Visit: Payer: Medicare Other | Attending: Family Medicine | Admitting: Physical Therapy

## 2021-05-17 ENCOUNTER — Other Ambulatory Visit: Payer: Self-pay

## 2021-05-17 DIAGNOSIS — M25561 Pain in right knee: Secondary | ICD-10-CM | POA: Insufficient documentation

## 2021-05-17 DIAGNOSIS — M62830 Muscle spasm of back: Secondary | ICD-10-CM | POA: Insufficient documentation

## 2021-05-17 DIAGNOSIS — R262 Difficulty in walking, not elsewhere classified: Secondary | ICD-10-CM | POA: Insufficient documentation

## 2021-05-17 DIAGNOSIS — M545 Low back pain, unspecified: Secondary | ICD-10-CM | POA: Insufficient documentation

## 2021-05-17 DIAGNOSIS — G8929 Other chronic pain: Secondary | ICD-10-CM | POA: Insufficient documentation

## 2021-05-17 NOTE — Therapy (Signed)
[facility]. [place], [place], [address] Phone: [phone]   Fax:  [phone]  Physical Therapy Evaluation  Patient Details  Name: Linda Mcgrath MRN: [id number] Date of Birth: 04-Aug-1933 Referring Provider (PT): [name]   Encounter Date: 05/17/2021   PT End of Session - 05/17/21 1417     Visit Number 1    Date for PT Re-Evaluation 08/16/21    Authorization Type Medicare    PT Start Time 1345    PT Stop Time 1430    PT Time Calculation (min) 45 min    Activity Tolerance Patient tolerated treatment well    Behavior During Therapy [facility] for tasks assessed/performed             Past Medical History:  Diagnosis Date   Allergic rhinitis    Arthritis    Asthma    Cataract    Bil/lens implant   Colon polyp 1999   small polyp   Diverticulosis    Dysrhythmia    Fibromyalgia    Hyperlipidemia    Hypothyroidism    Internal hemorrhoids    Leg cramps    Lichen planus    Lung nodule    stable LUL 9 mm   Menopausal syndrome    Osteoarthritis    UTI (urinary tract infection)     Past Surgical History:  Procedure Laterality Date   Abd U/S  11/1998   negative   Abd U/S  01/2001   gallbladder polyps   ABDOMINAL HYSTERECTOMY  1975   total-fibroids   APPENDECTOMY  1975   BREAST BIOPSY     benign   BREAST EXCISIONAL BIOPSY Left 1957   CARDIOVERSION N/A 03/07/2016   Procedure: CARDIOVERSION;  Surgeon: Adrian Prows, MD;  Location: [facility];  Service: Cardiovascular;  Laterality: N/A;   CATARACT EXTRACTION     bilateral   CHOLECYSTECTOMY     COLONOSCOPY  1998   Diverticulosis; polyp\   COLONOSCOPY  09/2002   Diverticulosis, hem   COLONOSCOPY  12/2007   diverticulosis, polyp   DEXA  10/2001   osteopenia   ELECTROPHYSIOLOGIC STUDY N/A 04/03/2016   Procedure: A-Flutter Ablation;  Surgeon: Will Meredith Leeds, MD;  Location: [facility] [facility];  Service: Cardiovascular;  Laterality: N/A;    ESOPHAGOGASTRODUODENOSCOPY  2004   Hida scan  01/2001   Negative   KNEE SURGERY     right knee / 11/2004   LAMINECTOMY WITH POSTERIOR LATERAL ARTHRODESIS LEVEL 2 N/A 07/31/2017   Procedure: DECOMPRESSIVE LAMINECTOMY LUMABR FOUR-FIVE, LUMBAR FIVE-SACRAL ONE;  Surgeon: Eustace Moore, MD;  Location: [facility];  Service: Neurosurgery;  Laterality: N/A;   laser surgery for glaucoma Left Sep 21, 2014   MULTIPLE TOOTH EXTRACTIONS     ROTATOR CUFF REPAIR     x 2 /right shoulder   SKIN CANCER EXCISION     pre-melanoma / on face   TEE WITHOUT CARDIOVERSION N/A 03/07/2016   Procedure: TRANSESOPHAGEAL ECHOCARDIOGRAM (TEE);  Surgeon: Adrian Prows, MD;  Location: [facility];  Service: Cardiovascular;  Laterality: N/A;   TOE SURGERY     rt foot     There were no vitals filed for this visit.    Subjective Assessment - 05/17/21 1347     Subjective Patient is familiear to me from the past with back, knee and shoulder pain.  She reports that recently her step son who is living with her fell and she was trying to get him up and she  Patient will benefit from skilled therapeutic intervention in order to improve the following deficits and impairments:  Abnormal gait, Decreased range of motion, Difficulty walking, Increased muscle spasms, Pain, Decreased activity tolerance, Decreased balance, Impaired flexibility, Improper body mechanics, Decreased mobility, Decreased strength, Postural dysfunction  Visit Diagnosis: Acute bilateral low back pain without sciatica - Plan: PT plan of care cert/re-cert  Difficulty in walking, not elsewhere classified - Plan: PT plan of care cert/re-cert  Muscle spasm of back - Plan: PT plan of care cert/re-cert  Chronic pain of right knee - Plan: PT plan of care cert/re-cert     Problem List Patient Active Problem List   Diagnosis Date Noted   Chronic upper back pain 08/30/2020   Aortic atherosclerosis ([place]) 05/31/2020   Abnormal CT scan, small bowel 05/18/2020   Generalized abdominal pain 05/17/2020   Duodenitis 03/13/2020   Right lateral abdominal pain 03/07/2020   Insomnia 12/15/2019   Chronic low back pain 10/18/2019   Positive depression screening 10/18/2019   Osteoarthritis of right knee 08/02/2019   Diarrhea 07/03/2018   Dyspepsia 07/03/2018   S/P lumbar spinal fusion 07/31/2017   Right low back pain 03/04/2017   H/O atrial flutter 03/06/2016   Obesity  11/06/2015   Urge incontinence 01/24/2015   Estrogen deficiency 83/15/1761   Lichen planus 60/73/7106   Encounter for Medicare annual wellness exam 06/17/2013   Screening mammogram, encounter for 06/15/2012   Prediabetes 06/03/2011   HYPERTENSION, BENIGN ESSENTIAL 10/23/2010   OSTEOARTHRITIS, HANDS, BILATERAL 01/24/2010   Hypothyroidism 08/24/2008   Vitamin D deficiency 06/06/2008   Hyperlipidemia 06/06/2008   MENOPAUSAL SYNDROME 03/29/2008   Osteopenia 03/29/2008   Allergic rhinitis 01/25/2008   IBS 01/25/2008   OSTEOARTHRITIS 01/25/2008   FIBROMYALGIA 01/25/2008    Sumner Boast, PT 05/17/2021, 2:24 PM  [place]. [place], [place], [address] Phone: [phone]   Fax:  [phone]  Name: Linda Mcgrath MRN: [id number] Date of Birth: 07/06/1933  [facility]. [place], [place], [address] Phone: [phone]   Fax:  [phone]  Physical Therapy Evaluation  Patient Details  Name: Linda Mcgrath MRN: [id number] Date of Birth: 04-Aug-1933 Referring Provider (PT): [name]   Encounter Date: 05/17/2021   PT End of Session - 05/17/21 1417     Visit Number 1    Date for PT Re-Evaluation 08/16/21    Authorization Type Medicare    PT Start Time 1345    PT Stop Time 1430    PT Time Calculation (min) 45 min    Activity Tolerance Patient tolerated treatment well    Behavior During Therapy [facility] for tasks assessed/performed             Past Medical History:  Diagnosis Date   Allergic rhinitis    Arthritis    Asthma    Cataract    Bil/lens implant   Colon polyp 1999   small polyp   Diverticulosis    Dysrhythmia    Fibromyalgia    Hyperlipidemia    Hypothyroidism    Internal hemorrhoids    Leg cramps    Lichen planus    Lung nodule    stable LUL 9 mm   Menopausal syndrome    Osteoarthritis    UTI (urinary tract infection)     Past Surgical History:  Procedure Laterality Date   Abd U/S  11/1998   negative   Abd U/S  01/2001   gallbladder polyps   ABDOMINAL HYSTERECTOMY  1975   total-fibroids   APPENDECTOMY  1975   BREAST BIOPSY     benign   BREAST EXCISIONAL BIOPSY Left 1957   CARDIOVERSION N/A 03/07/2016   Procedure: CARDIOVERSION;  Surgeon: Adrian Prows, MD;  Location: [facility];  Service: Cardiovascular;  Laterality: N/A;   CATARACT EXTRACTION     bilateral   CHOLECYSTECTOMY     COLONOSCOPY  1998   Diverticulosis; polyp\   COLONOSCOPY  09/2002   Diverticulosis, hem   COLONOSCOPY  12/2007   diverticulosis, polyp   DEXA  10/2001   osteopenia   ELECTROPHYSIOLOGIC STUDY N/A 04/03/2016   Procedure: A-Flutter Ablation;  Surgeon: Will Meredith Leeds, MD;  Location: [place] [facility];  Service: Cardiovascular;  Laterality: N/A;    ESOPHAGOGASTRODUODENOSCOPY  2004   Hida scan  01/2001   Negative   KNEE SURGERY     right knee / 11/2004   LAMINECTOMY WITH POSTERIOR LATERAL ARTHRODESIS LEVEL 2 N/A 07/31/2017   Procedure: DECOMPRESSIVE LAMINECTOMY LUMABR FOUR-FIVE, LUMBAR FIVE-SACRAL ONE;  Surgeon: Eustace Moore, MD;  Location: [facility];  Service: Neurosurgery;  Laterality: N/A;   laser surgery for glaucoma Left Sep 21, 2014   MULTIPLE TOOTH EXTRACTIONS     ROTATOR CUFF REPAIR     x 2 /right shoulder   SKIN CANCER EXCISION     pre-melanoma / on face   TEE WITHOUT CARDIOVERSION N/A 03/07/2016   Procedure: TRANSESOPHAGEAL ECHOCARDIOGRAM (TEE);  Surgeon: Adrian Prows, MD;  Location: [facility];  Service: Cardiovascular;  Laterality: N/A;   TOE SURGERY     rt foot     There were no vitals filed for this visit.    Subjective Assessment - 05/17/21 1347     Subjective Patient is familiear to me from the past with back, knee and shoulder pain.  She reports that recently her step son who is living with her fell and she was trying to get him up and she  [facility]. [place], [place], [address] Phone: [phone]   Fax:  [phone]  Physical Therapy Evaluation  Patient Details  Name: Linda Mcgrath MRN: [id number] Date of Birth: 04-Aug-1933 Referring Provider (PT): [name]   Encounter Date: 05/17/2021   PT End of Session - 05/17/21 1417     Visit Number 1    Date for PT Re-Evaluation 08/16/21    Authorization Type Medicare    PT Start Time 1345    PT Stop Time 1430    PT Time Calculation (min) 45 min    Activity Tolerance Patient tolerated treatment well    Behavior During Therapy [facility] for tasks assessed/performed             Past Medical History:  Diagnosis Date   Allergic rhinitis    Arthritis    Asthma    Cataract    Bil/lens implant   Colon polyp 1999   small polyp   Diverticulosis    Dysrhythmia    Fibromyalgia    Hyperlipidemia    Hypothyroidism    Internal hemorrhoids    Leg cramps    Lichen planus    Lung nodule    stable LUL 9 mm   Menopausal syndrome    Osteoarthritis    UTI (urinary tract infection)     Past Surgical History:  Procedure Laterality Date   Abd U/S  11/1998   negative   Abd U/S  01/2001   gallbladder polyps   ABDOMINAL HYSTERECTOMY  1975   total-fibroids   APPENDECTOMY  1975   BREAST BIOPSY     benign   BREAST EXCISIONAL BIOPSY Left 1957   CARDIOVERSION N/A 03/07/2016   Procedure: CARDIOVERSION;  Surgeon: Adrian Prows, MD;  Location: [facility];  Service: Cardiovascular;  Laterality: N/A;   CATARACT EXTRACTION     bilateral   CHOLECYSTECTOMY     COLONOSCOPY  1998   Diverticulosis; polyp\   COLONOSCOPY  09/2002   Diverticulosis, hem   COLONOSCOPY  12/2007   diverticulosis, polyp   DEXA  10/2001   osteopenia   ELECTROPHYSIOLOGIC STUDY N/A 04/03/2016   Procedure: A-Flutter Ablation;  Surgeon: Will Meredith Leeds, MD;  Location: [place] [facility];  Service: Cardiovascular;  Laterality: N/A;    ESOPHAGOGASTRODUODENOSCOPY  2004   Hida scan  01/2001   Negative   KNEE SURGERY     right knee / 11/2004   LAMINECTOMY WITH POSTERIOR LATERAL ARTHRODESIS LEVEL 2 N/A 07/31/2017   Procedure: DECOMPRESSIVE LAMINECTOMY LUMABR FOUR-FIVE, LUMBAR FIVE-SACRAL ONE;  Surgeon: Eustace Moore, MD;  Location: [facility];  Service: Neurosurgery;  Laterality: N/A;   laser surgery for glaucoma Left Sep 21, 2014   MULTIPLE TOOTH EXTRACTIONS     ROTATOR CUFF REPAIR     x 2 /right shoulder   SKIN CANCER EXCISION     pre-melanoma / on face   TEE WITHOUT CARDIOVERSION N/A 03/07/2016   Procedure: TRANSESOPHAGEAL ECHOCARDIOGRAM (TEE);  Surgeon: Adrian Prows, MD;  Location: [facility];  Service: Cardiovascular;  Laterality: N/A;   TOE SURGERY     rt foot     There were no vitals filed for this visit.    Subjective Assessment - 05/17/21 1347     Subjective Patient is familiear to me from the past with back, knee and shoulder pain.  She reports that recently her step son who is living with her fell and she was trying to get him up and she

## 2021-05-18 DIAGNOSIS — H33312 Horseshoe tear of retina without detachment, left eye: Secondary | ICD-10-CM | POA: Diagnosis not present

## 2021-05-21 ENCOUNTER — Other Ambulatory Visit: Payer: Self-pay | Admitting: Internal Medicine

## 2021-05-26 DIAGNOSIS — Z23 Encounter for immunization: Secondary | ICD-10-CM | POA: Diagnosis not present

## 2021-05-28 ENCOUNTER — Encounter: Payer: Self-pay | Admitting: Physical Therapy

## 2021-05-28 ENCOUNTER — Ambulatory Visit: Payer: Medicare Other | Attending: Family Medicine | Admitting: Physical Therapy

## 2021-05-28 ENCOUNTER — Other Ambulatory Visit: Payer: Self-pay

## 2021-05-28 DIAGNOSIS — M542 Cervicalgia: Secondary | ICD-10-CM | POA: Insufficient documentation

## 2021-05-28 DIAGNOSIS — M545 Low back pain, unspecified: Secondary | ICD-10-CM | POA: Insufficient documentation

## 2021-05-28 DIAGNOSIS — M62830 Muscle spasm of back: Secondary | ICD-10-CM | POA: Diagnosis not present

## 2021-05-28 DIAGNOSIS — M25561 Pain in right knee: Secondary | ICD-10-CM | POA: Insufficient documentation

## 2021-05-28 DIAGNOSIS — G8929 Other chronic pain: Secondary | ICD-10-CM | POA: Insufficient documentation

## 2021-05-28 DIAGNOSIS — R262 Difficulty in walking, not elsewhere classified: Secondary | ICD-10-CM | POA: Diagnosis not present

## 2021-05-28 NOTE — Therapy (Signed)
[facility] [address] [place], [place], [address] Phone: [phone]   Fax:  [phone]  Physical Therapy Treatment  Patient Details  Name: Linda Mcgrath MRN: [id number] Date of Birth: 02-10-1933 Referring Provider (PT): [name]   Encounter Date: 05/28/2021   PT End of Session - 05/28/21 1434     Visit Number 2    Date for PT Re-Evaluation 08/16/21    Authorization Type Medicare    PT Start Time 1355    PT Stop Time 1440    PT Time Calculation (min) 45 min    Activity Tolerance Patient tolerated treatment well    Behavior During Therapy [facility] for tasks assessed/performed             Past Medical History:  Diagnosis Date   Allergic rhinitis    Arthritis    Asthma    Cataract    Bil/lens implant   Colon polyp 1999   small polyp   Diverticulosis    Dysrhythmia    Fibromyalgia    Hyperlipidemia    Hypothyroidism    Internal hemorrhoids    Leg cramps    Lichen planus    Lung nodule    stable LUL 9 mm   Menopausal syndrome    Osteoarthritis    UTI (urinary tract infection)     Past Surgical History:  Procedure Laterality Date   Abd U/S  11/1998   negative   Abd U/S  01/2001   gallbladder polyps   ABDOMINAL HYSTERECTOMY  1975   total-fibroids   APPENDECTOMY  1975   BREAST BIOPSY     benign   BREAST EXCISIONAL BIOPSY Left 1957   CARDIOVERSION N/A 03/07/2016   Procedure: CARDIOVERSION;  Surgeon: Yates Decamp, MD;  Location: [facility] ENDOSCOPY;  Service: Cardiovascular;  Laterality: N/A;   CATARACT EXTRACTION     bilateral   CHOLECYSTECTOMY     COLONOSCOPY  1998   Diverticulosis; polyp\   COLONOSCOPY  09/2002   Diverticulosis, hem   COLONOSCOPY  12/2007   diverticulosis, polyp   DEXA  10/2001   osteopenia   ELECTROPHYSIOLOGIC STUDY N/A 04/03/2016   Procedure: A-Flutter Ablation;  Surgeon: Will [name], MD;  [facility]: [facility] INVASIVE CV LAB;  Service: Cardiovascular;  Laterality: N/A;    ESOPHAGOGASTRODUODENOSCOPY  2004   Hida scan  01/2001   Negative   KNEE SURGERY     right knee / 11/2004   LAMINECTOMY WITH POSTERIOR LATERAL ARTHRODESIS LEVEL 2 N/A 07/31/2017   Procedure: DECOMPRESSIVE LAMINECTOMY LUMABR FOUR-FIVE, LUMBAR FIVE-SACRAL ONE;  Surgeon: Tia Alert, MD;  Location: [facility] OR;  Service: Neurosurgery;  Laterality: N/A;   laser surgery for glaucoma Left Sep 21, 2014   MULTIPLE TOOTH EXTRACTIONS     ROTATOR CUFF REPAIR     x 2 /right shoulder   SKIN CANCER EXCISION     pre-melanoma / on face   TEE WITHOUT CARDIOVERSION N/A 03/07/2016   Procedure: TRANSESOPHAGEAL ECHOCARDIOGRAM (TEE);  Surgeon: Yates Decamp, MD;  Location: [facility] ENDOSCOPY;  Service: Cardiovascular;  Laterality: N/A;   TOE SURGERY     rt foot     There were no vitals filed for this visit.   Subjective Assessment - 05/28/21 1402     Subjective Patient reports that she did not have power all day Saturday, she reports that she got very cold and this caused herback to tighten up    Currently in Pain? Yes    Pain Score 6  Pain Location Back    Pain Descriptors / Indicators Tightness;Aching;Sore    Aggravating Factors  cold                               [facility] Adult PT Treatment/Exercise - 05/28/21 0001       Exercises   Exercises Lumbar      Lumbar Exercises: Stretches   Passive Hamstring Stretch Right;Left;3 reps;20 seconds    Gastroc Stretch Right;Left;4 reps;10 seconds      Lumbar Exercises: Aerobic   Nustep level 3 x 7 minutes      Lumbar Exercises: Seated   Long Arc Quad on Chair Both;2 sets;10 reps    LAQ on Chair Weights (lbs) 2    Other Seated Lumbar Exercises marches 2# 2x10    Other Seated Lumbar Exercises toe raises, heel raises                       PT Short Term Goals - 05/17/21 1422       PT SHORT TERM GOAL #1   Title independent with HEP    Time 4    Period Weeks    Status New               PT Long Term Goals - 05/28/21  1439       PT LONG TERM GOAL #1   Title decrease pain 50%    Status On-going      PT LONG TERM GOAL #2   Title increase lumbar ROM 25%    Status On-going                   Plan - 05/28/21 1435     Clinical Impression Statement Patinet reports that her back is hurting wose today, she reports that she lost power and the house got cold, she reports that the back seems to have tightened up and she is having some increase of pain.She reports that she feels very stiff,and has a little more difficulty walking today, her back is very tight today    PT Next Visit Plan will work on strength, safety, ROM and function  and see if we can decrease her pain    Consulted and Agree with Plan of Care Patient             Patient will benefit from skilled therapeutic intervention in order to improve the following deficits and impairments:  Abnormal gait, Decreased range of motion, Difficulty walking, Increased muscle spasms, Pain, Decreased activity tolerance, Decreased balance, Impaired flexibility, Improper body mechanics, Decreased mobility, Decreased strength, Postural dysfunction  Visit Diagnosis: Acute bilateral low back pain without sciatica  Difficulty in walking, not elsewhere classified  Muscle spasm of back  Chronic pain of right knee  Cervicalgia     Problem List Patient Active Problem List   Diagnosis Date Noted   Chronic upper back pain 08/30/2020   Aortic atherosclerosis (HCC) 05/31/2020   Abnormal CT scan, small bowel 05/18/2020   Generalized abdominal pain 05/17/2020   Duodenitis 03/13/2020   Right lateral abdominal pain 03/07/2020   Insomnia 12/15/2019   Chronic low back pain 10/18/2019   Positive depression screening 10/18/2019   Osteoarthritis of right knee 08/02/2019   Diarrhea 07/03/2018   Dyspepsia 07/03/2018   S/P lumbar spinal fusion 07/31/2017   Right low back pain 03/04/2017   H/O atrial flutter 03/06/2016   Obesity 11/06/2015  Urge  incontinence 01/24/2015   Estrogen deficiency 10/25/2014   Lichen planus 12/20/2013   Encounter for Medicare annual wellness exam 06/17/2013   Screening mammogram, encounter for 06/15/2012   Prediabetes 06/03/2011   HYPERTENSION, BENIGN ESSENTIAL 10/23/2010   OSTEOARTHRITIS, HANDS, BILATERAL 01/24/2010   Hypothyroidism 08/24/2008   Vitamin D deficiency 06/06/2008   Hyperlipidemia 06/06/2008   MENOPAUSAL SYNDROME 03/29/2008   Osteopenia 03/29/2008   Allergic rhinitis 01/25/2008   IBS 01/25/2008   OSTEOARTHRITIS 01/25/2008   FIBROMYALGIA 01/25/2008    Jearld Lesch, PT 05/28/2021, 2:39 PM  [facility] [address] [place], [place], [address] Phone: [phone]   Fax:  [phone]  Name: JEREMI HAUGAN MRN: [id number] Date of Birth: 05-15-1933

## 2021-06-04 ENCOUNTER — Encounter: Payer: Self-pay | Admitting: Physical Therapy

## 2021-06-04 ENCOUNTER — Other Ambulatory Visit: Payer: Self-pay

## 2021-06-04 ENCOUNTER — Ambulatory Visit: Payer: Medicare Other | Admitting: Physical Therapy

## 2021-06-04 DIAGNOSIS — R262 Difficulty in walking, not elsewhere classified: Secondary | ICD-10-CM | POA: Diagnosis not present

## 2021-06-04 DIAGNOSIS — M545 Low back pain, unspecified: Secondary | ICD-10-CM

## 2021-06-04 DIAGNOSIS — G8929 Other chronic pain: Secondary | ICD-10-CM

## 2021-06-04 DIAGNOSIS — M62830 Muscle spasm of back: Secondary | ICD-10-CM | POA: Diagnosis not present

## 2021-06-04 DIAGNOSIS — M542 Cervicalgia: Secondary | ICD-10-CM | POA: Diagnosis not present

## 2021-06-04 DIAGNOSIS — M25561 Pain in right knee: Secondary | ICD-10-CM | POA: Diagnosis not present

## 2021-06-04 NOTE — Therapy (Signed)
screening 10/18/2019   Osteoarthritis of right knee 08/02/2019    Diarrhea 07/03/2018   Dyspepsia 07/03/2018   S/P lumbar spinal fusion 07/31/2017   Right low back pain 03/04/2017   H/O atrial flutter 03/06/2016   Obesity 11/06/2015   Urge incontinence 01/24/2015   Estrogen deficiency 62/13/0865   Lichen planus 78/46/9629   Encounter for Medicare annual wellness exam 06/17/2013   Screening mammogram, encounter for 06/15/2012   Prediabetes 06/03/2011   HYPERTENSION, BENIGN ESSENTIAL 10/23/2010   OSTEOARTHRITIS, HANDS, BILATERAL 01/24/2010   Hypothyroidism 08/24/2008   Vitamin D deficiency 06/06/2008   Hyperlipidemia 06/06/2008   MENOPAUSAL SYNDROME 03/29/2008   Osteopenia 03/29/2008   Allergic rhinitis 01/25/2008   IBS 01/25/2008   OSTEOARTHRITIS 01/25/2008   FIBROMYALGIA 01/25/2008    Linda Mcgrath, Linda Mcgrath 06/04/2021, 2:31 PM  [address]. [place], [place], [address] Phone: [phone]   Fax:  [phone]  Name: Linda Mcgrath MRN: [id number] Date of Birth: 06-Dec-1932  screening 10/18/2019   Osteoarthritis of right knee 08/02/2019    Diarrhea 07/03/2018   Dyspepsia 07/03/2018   S/P lumbar spinal fusion 07/31/2017   Right low back pain 03/04/2017   H/O atrial flutter 03/06/2016   Obesity 11/06/2015   Urge incontinence 01/24/2015   Estrogen deficiency 62/13/0865   Lichen planus 78/46/9629   Encounter for Medicare annual wellness exam 06/17/2013   Screening mammogram, encounter for 06/15/2012   Prediabetes 06/03/2011   HYPERTENSION, BENIGN ESSENTIAL 10/23/2010   OSTEOARTHRITIS, HANDS, BILATERAL 01/24/2010   Hypothyroidism 08/24/2008   Vitamin D deficiency 06/06/2008   Hyperlipidemia 06/06/2008   MENOPAUSAL SYNDROME 03/29/2008   Osteopenia 03/29/2008   Allergic rhinitis 01/25/2008   IBS 01/25/2008   OSTEOARTHRITIS 01/25/2008   FIBROMYALGIA 01/25/2008    Linda Mcgrath, Linda Mcgrath 06/04/2021, 2:31 PM  [place]. [place], [place], [address] Phone: [phone]   Fax:  [phone]  Name: Linda Mcgrath MRN: [id number] Date of Birth: 06-Dec-1932  [place] [place], [place], [address] Phone: [phone]   Fax:  [phone]  Physical Therapy Treatment  Patient Details  Name: Linda Mcgrath MRN: [id number] Date of Birth: Aug 26, 1933 Referring Provider (Linda Mcgrath): [name]   Encounter Date: 06/04/2021   Linda Mcgrath End of Session - 06/04/21 1428     Visit Number 3    Date for Linda Mcgrath Re-Evaluation 08/16/21    Authorization Type Medicare    Linda Mcgrath Start Time 1355    Linda Mcgrath Stop Time 1443    Linda Mcgrath Time Calculation (min) 48 min    Activity Tolerance Patient tolerated treatment well    Behavior During Therapy [facility] for tasks assessed/performed             Past Medical History:  Diagnosis Date   Allergic rhinitis    Arthritis    Asthma    Cataract    Bil/lens implant   Colon polyp 1999   small polyp   Diverticulosis    Dysrhythmia    Fibromyalgia    Hyperlipidemia    Hypothyroidism    Internal hemorrhoids    Leg cramps    Lichen planus    Lung nodule    stable LUL 9 mm   Menopausal syndrome    Osteoarthritis    UTI (urinary tract infection)     Past Surgical History:  Procedure Laterality Date   Abd U/S  11/1998   negative   Abd U/S  01/2001   gallbladder polyps   ABDOMINAL HYSTERECTOMY  1975   total-fibroids   APPENDECTOMY  1975   BREAST BIOPSY     benign   BREAST EXCISIONAL BIOPSY Left 1957   CARDIOVERSION N/A 03/07/2016   Procedure: CARDIOVERSION;  Surgeon: Adrian Prows, MD;  Location: [place];  Service: Cardiovascular;  Laterality: N/A;   CATARACT EXTRACTION     bilateral   CHOLECYSTECTOMY     COLONOSCOPY  1998   Diverticulosis; polyp\   COLONOSCOPY  09/2002   Diverticulosis, hem   COLONOSCOPY  12/2007   diverticulosis, polyp   DEXA  10/2001   osteopenia   ELECTROPHYSIOLOGIC STUDY N/A 04/03/2016   Procedure: A-Flutter Ablation;  Surgeon: Will Meredith Leeds, MD;  Location: [place] CV LAB;  Service: Cardiovascular;  Laterality: N/A;    ESOPHAGOGASTRODUODENOSCOPY  2004   Hida scan  01/2001   Negative   KNEE SURGERY     right knee / 11/2004   LAMINECTOMY WITH POSTERIOR LATERAL ARTHRODESIS LEVEL 2 N/A 07/31/2017   Procedure: DECOMPRESSIVE LAMINECTOMY LUMABR FOUR-FIVE, LUMBAR FIVE-SACRAL ONE;  Surgeon: Eustace Moore, MD;  Location: [employer];  Service: Neurosurgery;  Laterality: N/A;   laser surgery for glaucoma Left Sep 21, 2014   MULTIPLE TOOTH EXTRACTIONS     ROTATOR CUFF REPAIR     x 2 /right shoulder   SKIN CANCER EXCISION     pre-melanoma / on face   TEE WITHOUT CARDIOVERSION N/A 03/07/2016   Procedure: TRANSESOPHAGEAL ECHOCARDIOGRAM (TEE);  Surgeon: Adrian Prows, MD;  Location: [place];  Service: Cardiovascular;  Laterality: N/A;   TOE SURGERY     rt foot     There were no vitals filed for this visit.   Subjective Assessment - 06/04/21 1404     Subjective Patient reports that she is struggling with pain in the back, hands and the hips today, she is limping and walking slower    Currently in Pain? Yes    Pain Score 6

## 2021-06-12 ENCOUNTER — Ambulatory Visit: Payer: Medicare Other | Admitting: Physical Therapy

## 2021-06-12 ENCOUNTER — Encounter: Payer: Self-pay | Admitting: Physical Therapy

## 2021-06-12 ENCOUNTER — Other Ambulatory Visit: Payer: Self-pay

## 2021-06-12 DIAGNOSIS — M545 Low back pain, unspecified: Secondary | ICD-10-CM | POA: Diagnosis not present

## 2021-06-12 DIAGNOSIS — R262 Difficulty in walking, not elsewhere classified: Secondary | ICD-10-CM

## 2021-06-12 DIAGNOSIS — G8929 Other chronic pain: Secondary | ICD-10-CM | POA: Diagnosis not present

## 2021-06-12 DIAGNOSIS — M542 Cervicalgia: Secondary | ICD-10-CM

## 2021-06-12 DIAGNOSIS — M62830 Muscle spasm of back: Secondary | ICD-10-CM | POA: Diagnosis not present

## 2021-06-12 DIAGNOSIS — M25561 Pain in right knee: Secondary | ICD-10-CM | POA: Diagnosis not present

## 2021-06-12 NOTE — Therapy (Signed)
Muscle spasm of back  Chronic pain of right knee  Cervicalgia     Problem List Patient Active Problem List   Diagnosis Date Noted   Chronic upper back pain 08/30/2020   Aortic  atherosclerosis ([place]) 05/31/2020   Abnormal CT scan, small bowel 05/18/2020   Generalized abdominal pain 05/17/2020   Duodenitis 03/13/2020   Right lateral abdominal pain 03/07/2020   Insomnia 12/15/2019   Chronic low back pain 10/18/2019   Positive depression screening 10/18/2019   Osteoarthritis of right knee 08/02/2019   Diarrhea 07/03/2018   Dyspepsia 07/03/2018   S/P lumbar spinal fusion 07/31/2017   Right low back pain 03/04/2017   H/O atrial flutter 03/06/2016   Obesity 11/06/2015   Urge incontinence 01/24/2015   Estrogen deficiency 62/86/3817   Lichen planus 71/16/5790   Encounter for Medicare annual wellness exam 06/17/2013   Screening mammogram, encounter for 06/15/2012   Prediabetes 06/03/2011   HYPERTENSION, BENIGN ESSENTIAL 10/23/2010   OSTEOARTHRITIS, HANDS, BILATERAL 01/24/2010   Hypothyroidism 08/24/2008   Vitamin D deficiency 06/06/2008   Hyperlipidemia 06/06/2008   MENOPAUSAL SYNDROME 03/29/2008   Osteopenia 03/29/2008   Allergic rhinitis 01/25/2008   IBS 01/25/2008   OSTEOARTHRITIS 01/25/2008   FIBROMYALGIA 01/25/2008    Sumner Boast, PT 06/12/2021, 1:44 PM  [address]. [place], [place], [address] Phone: [phone]   Fax:  [phone]  Name: Linda Mcgrath MRN: [id number] Date of Birth: [birth year]  Muscle spasm of back  Chronic pain of right knee  Cervicalgia     Problem List Patient Active Problem List   Diagnosis Date Noted   Chronic upper back pain 08/30/2020   Aortic  atherosclerosis ([place]) 05/31/2020   Abnormal CT scan, small bowel 05/18/2020   Generalized abdominal pain 05/17/2020   Duodenitis 03/13/2020   Right lateral abdominal pain 03/07/2020   Insomnia 12/15/2019   Chronic low back pain 10/18/2019   Positive depression screening 10/18/2019   Osteoarthritis of right knee 08/02/2019   Diarrhea 07/03/2018   Dyspepsia 07/03/2018   S/P lumbar spinal fusion 07/31/2017   Right low back pain 03/04/2017   H/O atrial flutter 03/06/2016   Obesity 11/06/2015   Urge incontinence 01/24/2015   Estrogen deficiency 62/86/3817   Lichen planus 71/16/5790   Encounter for Medicare annual wellness exam 06/17/2013   Screening mammogram, encounter for 06/15/2012   Prediabetes 06/03/2011   HYPERTENSION, BENIGN ESSENTIAL 10/23/2010   OSTEOARTHRITIS, HANDS, BILATERAL 01/24/2010   Hypothyroidism 08/24/2008   Vitamin D deficiency 06/06/2008   Hyperlipidemia 06/06/2008   MENOPAUSAL SYNDROME 03/29/2008   Osteopenia 03/29/2008   Allergic rhinitis 01/25/2008   IBS 01/25/2008   OSTEOARTHRITIS 01/25/2008   FIBROMYALGIA 01/25/2008    Sumner Boast, PT 06/12/2021, 1:44 PM  [place]. [place], [place], [address] Phone: [phone]   Fax:  [phone]  Name: Linda Mcgrath MRN: [id number] Date of Birth: [birth year]  [address] [place], [place], [address] Phone: [phone]   Fax:  [phone]  Physical Therapy Treatment  Patient Details  Name: Linda Mcgrath MRN: [id number] Date of Birth: May 31, 1933 Referring Provider (PT): [name]   Encounter Date: 06/12/2021   PT End of Session - 06/12/21 1341     Visit Number 4    Date for PT Re-Evaluation 08/16/21    Authorization Type Medicare    PT Start Time 1255    PT Stop Time 1345    PT Time Calculation (min) 50 min    Activity Tolerance Patient tolerated treatment well    Behavior During Therapy [facility] for tasks assessed/performed             Past Medical History:  Diagnosis Date   Allergic rhinitis    Arthritis    Asthma    Cataract    Bil/lens implant   Colon polyp 1999   small polyp   Diverticulosis    Dysrhythmia    Fibromyalgia    Hyperlipidemia    Hypothyroidism    Internal hemorrhoids    Leg cramps    Lichen planus    Lung nodule    stable LUL 9 mm   Menopausal syndrome    Osteoarthritis    UTI (urinary tract infection)     Past Surgical History:  Procedure Laterality Date   Abd U/S  11/1998   negative   Abd U/S  01/2001   gallbladder polyps   ABDOMINAL HYSTERECTOMY  1975   total-fibroids   APPENDECTOMY  1975   BREAST BIOPSY     benign   BREAST EXCISIONAL BIOPSY Left 1957   CARDIOVERSION N/A 03/07/2016   Procedure: CARDIOVERSION;  Surgeon: Adrian Prows, MD;  Location: [place];  Service: Cardiovascular;  Laterality: N/A;   CATARACT EXTRACTION     bilateral   CHOLECYSTECTOMY     COLONOSCOPY  1998   Diverticulosis; polyp\   COLONOSCOPY  09/2002   Diverticulosis, hem   COLONOSCOPY  12/2007   diverticulosis, polyp   DEXA  10/2001   osteopenia   ELECTROPHYSIOLOGIC STUDY N/A 04/03/2016   Procedure: A-Flutter Ablation;  Surgeon: Will Meredith Leeds, MD;  Location: [place] CV LAB;  Service: Cardiovascular;  Laterality: N/A;    ESOPHAGOGASTRODUODENOSCOPY  2004   Hida scan  01/2001   Negative   KNEE SURGERY     right knee / 11/2004   LAMINECTOMY WITH POSTERIOR LATERAL ARTHRODESIS LEVEL 2 N/A 07/31/2017   Procedure: DECOMPRESSIVE LAMINECTOMY LUMABR FOUR-FIVE, LUMBAR FIVE-SACRAL ONE;  Surgeon: Eustace Moore, MD;  Location: [place];  Service: Neurosurgery;  Laterality: N/A;   laser surgery for glaucoma Left Sep 21, 2014   MULTIPLE TOOTH EXTRACTIONS     ROTATOR CUFF REPAIR     x 2 /right shoulder   SKIN CANCER EXCISION     pre-melanoma / on face   TEE WITHOUT CARDIOVERSION N/A 03/07/2016   Procedure: TRANSESOPHAGEAL ECHOCARDIOGRAM (TEE);  Surgeon: Adrian Prows, MD;  Location: [place];  Service: Cardiovascular;  Laterality: N/A;   TOE SURGERY     rt foot     There were no vitals filed for this visit.   Subjective Assessment - 06/12/21 1257     Subjective Patient reports that she had to help her "son" get up and put on TED hose, she reports that she is hurting more today after doing this care.  She also has a lot of stress as she reports

## 2021-06-26 ENCOUNTER — Other Ambulatory Visit: Payer: Self-pay

## 2021-06-26 ENCOUNTER — Encounter: Payer: Self-pay | Admitting: Physical Therapy

## 2021-06-26 ENCOUNTER — Ambulatory Visit: Payer: Medicare Other | Attending: Family Medicine | Admitting: Physical Therapy

## 2021-06-26 DIAGNOSIS — M545 Low back pain, unspecified: Secondary | ICD-10-CM | POA: Insufficient documentation

## 2021-06-26 DIAGNOSIS — M62830 Muscle spasm of back: Secondary | ICD-10-CM | POA: Diagnosis not present

## 2021-06-26 DIAGNOSIS — R262 Difficulty in walking, not elsewhere classified: Secondary | ICD-10-CM | POA: Diagnosis not present

## 2021-06-26 DIAGNOSIS — M542 Cervicalgia: Secondary | ICD-10-CM | POA: Diagnosis not present

## 2021-06-26 DIAGNOSIS — G8929 Other chronic pain: Secondary | ICD-10-CM | POA: Diagnosis not present

## 2021-06-26 DIAGNOSIS — M25561 Pain in right knee: Secondary | ICD-10-CM | POA: Insufficient documentation

## 2021-06-26 NOTE — Therapy (Signed)
strength, Postural dysfunction  Visit Diagnosis: Acute bilateral low back pain without sciatica  Difficulty in walking, not elsewhere classified  Muscle spasm of back  Chronic pain of right  knee  Cervicalgia     Problem List Patient Active Problem List   Diagnosis Date Noted   Chronic upper back pain 08/30/2020   Aortic atherosclerosis ([place]) 05/31/2020   Abnormal CT scan, small bowel 05/18/2020   Generalized abdominal pain 05/17/2020   Duodenitis 03/13/2020   Right lateral abdominal pain 03/07/2020   Insomnia 12/15/2019   Chronic low back pain 10/18/2019   Positive depression screening 10/18/2019   Osteoarthritis of right knee 08/02/2019   Diarrhea 07/03/2018   Dyspepsia 07/03/2018   S/P lumbar spinal fusion 07/31/2017   Right low back pain 03/04/2017   H/O atrial flutter 03/06/2016   Obesity 11/06/2015   Urge incontinence 01/24/2015   Estrogen deficiency 03/47/4259   Lichen planus 56/38/7564   Encounter for Medicare annual wellness exam 06/17/2013   Screening mammogram, encounter for 06/15/2012   Prediabetes 06/03/2011   HYPERTENSION, BENIGN ESSENTIAL 10/23/2010   OSTEOARTHRITIS, HANDS, BILATERAL 01/24/2010   Hypothyroidism 08/24/2008   Vitamin D deficiency 06/06/2008   Hyperlipidemia 06/06/2008   MENOPAUSAL SYNDROME 03/29/2008   Osteopenia 03/29/2008   Allergic rhinitis 01/25/2008   IBS 01/25/2008   OSTEOARTHRITIS 01/25/2008   FIBROMYALGIA 01/25/2008    Sumner Boast, PT 06/26/2021, 2:41 PM  [address]. [place], [place], [address] Phone: [phone]   Fax:  [phone]  Name: Linda Mcgrath MRN: [id number] Date of Birth: Mar 05, 1933  [address]. [place], [place], [address] Phone: [phone]   Fax:  [phone]  Physical Therapy Treatment  Patient Details  Name: Linda Mcgrath MRN: [id number] Date of Birth: Aug 18, 1933 Referring Provider (PT): [name]   Encounter Date: 06/26/2021   PT End of Session - 06/26/21 1433     Visit Number 5    Date for PT Re-Evaluation 08/16/21    Authorization Type Medicare    PT Start Time 1355    PT Stop Time 1455    PT Time Calculation (min) 60 min    Activity Tolerance Patient tolerated treatment well    Behavior During Therapy [facility] for tasks assessed/performed             Past Medical History:  Diagnosis Date   Allergic rhinitis    Arthritis    Asthma    Cataract    Bil/lens implant   Colon polyp 1999   small polyp   Diverticulosis    Dysrhythmia    Fibromyalgia    Hyperlipidemia    Hypothyroidism    Internal hemorrhoids    Leg cramps    Lichen planus    Lung nodule    stable LUL 9 mm   Menopausal syndrome    Osteoarthritis    UTI (urinary tract infection)     Past Surgical History:  Procedure Laterality Date   Abd U/S  11/1998   negative   Abd U/S  01/2001   gallbladder polyps   ABDOMINAL HYSTERECTOMY  1975   total-fibroids   APPENDECTOMY  1975   BREAST BIOPSY     benign   BREAST EXCISIONAL BIOPSY Left 1957   CARDIOVERSION N/A 03/07/2016   Procedure: CARDIOVERSION;  Surgeon: Adrian Prows, MD;  Location: [place];  Service: Cardiovascular;  Laterality: N/A;   CATARACT EXTRACTION     bilateral   CHOLECYSTECTOMY     COLONOSCOPY  1998   Diverticulosis; polyp\   COLONOSCOPY  09/2002   Diverticulosis, hem   COLONOSCOPY  12/2007   diverticulosis, polyp   DEXA  10/2001   osteopenia   ELECTROPHYSIOLOGIC STUDY N/A 04/03/2016   Procedure: A-Flutter Ablation;  Surgeon: Will Meredith Leeds, MD;  Location: [place] CV LAB;  Service: Cardiovascular;  Laterality: N/A;    ESOPHAGOGASTRODUODENOSCOPY  2004   Hida scan  01/2001   Negative   KNEE SURGERY     right knee / 11/2004   LAMINECTOMY WITH POSTERIOR LATERAL ARTHRODESIS LEVEL 2 N/A 07/31/2017   Procedure: DECOMPRESSIVE LAMINECTOMY LUMABR FOUR-FIVE, LUMBAR FIVE-SACRAL ONE;  Surgeon: Eustace Moore, MD;  Location: [place];  Service: Neurosurgery;  Laterality: N/A;   laser surgery for glaucoma Left Sep 21, 2014   MULTIPLE TOOTH EXTRACTIONS     ROTATOR CUFF REPAIR     x 2 /right shoulder   SKIN CANCER EXCISION     pre-melanoma / on face   TEE WITHOUT CARDIOVERSION N/A 03/07/2016   Procedure: TRANSESOPHAGEAL ECHOCARDIOGRAM (TEE);  Surgeon: Adrian Prows, MD;  Location: [place];  Service: Cardiovascular;  Laterality: N/A;   TOE SURGERY     rt foot     There were no vitals filed for this visit.   Subjective Assessment - 06/26/21 1401     Subjective report that she woke up in pain in the low back, reports that she has been busy over the weekend    Currently in Pain? Yes    Pain Score 6     Pain Location  strength, Postural dysfunction  Visit Diagnosis: Acute bilateral low back pain without sciatica  Difficulty in walking, not elsewhere classified  Muscle spasm of back  Chronic pain of right  knee  Cervicalgia     Problem List Patient Active Problem List   Diagnosis Date Noted   Chronic upper back pain 08/30/2020   Aortic atherosclerosis ([place]) 05/31/2020   Abnormal CT scan, small bowel 05/18/2020   Generalized abdominal pain 05/17/2020   Duodenitis 03/13/2020   Right lateral abdominal pain 03/07/2020   Insomnia 12/15/2019   Chronic low back pain 10/18/2019   Positive depression screening 10/18/2019   Osteoarthritis of right knee 08/02/2019   Diarrhea 07/03/2018   Dyspepsia 07/03/2018   S/P lumbar spinal fusion 07/31/2017   Right low back pain 03/04/2017   H/O atrial flutter 03/06/2016   Obesity 11/06/2015   Urge incontinence 01/24/2015   Estrogen deficiency 03/47/4259   Lichen planus 56/38/7564   Encounter for Medicare annual wellness exam 06/17/2013   Screening mammogram, encounter for 06/15/2012   Prediabetes 06/03/2011   HYPERTENSION, BENIGN ESSENTIAL 10/23/2010   OSTEOARTHRITIS, HANDS, BILATERAL 01/24/2010   Hypothyroidism 08/24/2008   Vitamin D deficiency 06/06/2008   Hyperlipidemia 06/06/2008   MENOPAUSAL SYNDROME 03/29/2008   Osteopenia 03/29/2008   Allergic rhinitis 01/25/2008   IBS 01/25/2008   OSTEOARTHRITIS 01/25/2008   FIBROMYALGIA 01/25/2008    Sumner Boast, PT 06/26/2021, 2:41 PM  [address] [place], [place], [address] Phone: [phone]   Fax:  [phone]  Name: Linda Mcgrath MRN: [id number] Date of Birth: Mar 05, 1933

## 2021-07-02 ENCOUNTER — Encounter: Payer: Self-pay | Admitting: Physical Therapy

## 2021-07-02 ENCOUNTER — Other Ambulatory Visit: Payer: Self-pay

## 2021-07-02 ENCOUNTER — Ambulatory Visit: Payer: Medicare Other | Admitting: Physical Therapy

## 2021-07-02 DIAGNOSIS — G8929 Other chronic pain: Secondary | ICD-10-CM

## 2021-07-02 DIAGNOSIS — M62830 Muscle spasm of back: Secondary | ICD-10-CM

## 2021-07-02 DIAGNOSIS — R262 Difficulty in walking, not elsewhere classified: Secondary | ICD-10-CM | POA: Diagnosis not present

## 2021-07-02 DIAGNOSIS — M25561 Pain in right knee: Secondary | ICD-10-CM | POA: Diagnosis not present

## 2021-07-02 DIAGNOSIS — M542 Cervicalgia: Secondary | ICD-10-CM

## 2021-07-02 DIAGNOSIS — M545 Low back pain, unspecified: Secondary | ICD-10-CM

## 2021-07-02 NOTE — Therapy (Signed)
[place]. [place], [place], [address] Phone: [phone]   Fax:  [phone]  Physical Therapy Treatment  Patient Details  Name: Linda Mcgrath MRN: [id number] Date of Birth: 1932-12-27 Referring Provider (PT): [name]   Encounter Date: 07/02/2021   PT End of Session - 07/02/21 1429     Visit Number 6    Date for PT Re-Evaluation 08/16/21    Authorization Type Medicare             Past Medical History:  Diagnosis Date   Allergic rhinitis    Arthritis    Asthma    Cataract    Bil/lens implant   Colon polyp 1999   small polyp   Diverticulosis    Dysrhythmia    Fibromyalgia    Hyperlipidemia    Hypothyroidism    Internal hemorrhoids    Leg cramps    Lichen planus    Lung nodule    stable LUL 9 mm   Menopausal syndrome    Osteoarthritis    UTI (urinary tract infection)     Past Surgical History:  Procedure Laterality Date   Abd U/S  11/1998   negative   Abd U/S  01/2001   gallbladder polyps   ABDOMINAL HYSTERECTOMY  1975   total-fibroids   APPENDECTOMY  1975   BREAST BIOPSY     benign   BREAST EXCISIONAL BIOPSY Left 1957   CARDIOVERSION N/A 03/07/2016   Procedure: CARDIOVERSION;  Surgeon: Adrian Prows, MD;  Location: [facility];  Service: Cardiovascular;  Laterality: N/A;   CATARACT EXTRACTION     bilateral   CHOLECYSTECTOMY     COLONOSCOPY  1998   Diverticulosis; polyp\   COLONOSCOPY  09/2002   Diverticulosis, hem   COLONOSCOPY  12/2007   diverticulosis, polyp   DEXA  10/2001   osteopenia   ELECTROPHYSIOLOGIC STUDY N/A 04/03/2016   Procedure: A-Flutter Ablation;  Surgeon: Will Meredith Leeds, MD;  Location: [facility];  Service: Cardiovascular;  Laterality: N/A;   ESOPHAGOGASTRODUODENOSCOPY  2004   Hida scan  01/2001   Negative   KNEE SURGERY     right knee / 11/2004   LAMINECTOMY WITH POSTERIOR LATERAL ARTHRODESIS LEVEL 2 N/A 07/31/2017   Procedure: DECOMPRESSIVE  LAMINECTOMY LUMABR FOUR-FIVE, LUMBAR FIVE-SACRAL ONE;  Surgeon: Eustace Moore, MD;  Location: [facility];  Service: Neurosurgery;  Laterality: N/A;   laser surgery for glaucoma Left Sep 21, 2014   MULTIPLE TOOTH EXTRACTIONS     ROTATOR CUFF REPAIR     x 2 /right shoulder   SKIN CANCER EXCISION     pre-melanoma / on face   TEE WITHOUT CARDIOVERSION N/A 03/07/2016   Procedure: TRANSESOPHAGEAL ECHOCARDIOGRAM (TEE);  Surgeon: Adrian Prows, MD;  Location: [facility];  Service: Cardiovascular;  Laterality: N/A;   TOE SURGERY     rt foot     There were no vitals filed for this visit.   Subjective Assessment - 07/02/21 1405     Subjective I was feeling better after last time.    Currently in Pain? Yes    Pain Score 5     Pain Location Back    Pain Orientation Lower    Aggravating Factors  activity    Pain Relieving Factors the last treatment helped                               [facility]  Diarrhea 07/03/2018   Dyspepsia 07/03/2018   S/P lumbar spinal  fusion 07/31/2017   Right low back pain 03/04/2017   H/O atrial flutter 03/06/2016   Obesity 11/06/2015   Urge incontinence 01/24/2015   Estrogen deficiency 24/46/9507   Lichen planus 22/57/5051   Encounter for Medicare annual wellness exam 06/17/2013   Screening mammogram, encounter for 06/15/2012   Prediabetes 06/03/2011   HYPERTENSION, BENIGN ESSENTIAL 10/23/2010   OSTEOARTHRITIS, HANDS, BILATERAL 01/24/2010   Hypothyroidism 08/24/2008   Vitamin D deficiency 06/06/2008   Hyperlipidemia 06/06/2008   MENOPAUSAL SYNDROME 03/29/2008   Osteopenia 03/29/2008   Allergic rhinitis 01/25/2008   IBS 01/25/2008   OSTEOARTHRITIS 01/25/2008   FIBROMYALGIA 01/25/2008    Sumner Boast, PT 07/02/2021, 2:33 PM  [place]. [place], [place], [address] Phone: [phone]   Fax:  [phone]  Name: Linda Mcgrath MRN: [id number] Date of Birth: [birth year]  Diarrhea 07/03/2018   Dyspepsia 07/03/2018   S/P lumbar spinal  fusion 07/31/2017   Right low back pain 03/04/2017   H/O atrial flutter 03/06/2016   Obesity 11/06/2015   Urge incontinence 01/24/2015   Estrogen deficiency 24/46/9507   Lichen planus 22/57/5051   Encounter for Medicare annual wellness exam 06/17/2013   Screening mammogram, encounter for 06/15/2012   Prediabetes 06/03/2011   HYPERTENSION, BENIGN ESSENTIAL 10/23/2010   OSTEOARTHRITIS, HANDS, BILATERAL 01/24/2010   Hypothyroidism 08/24/2008   Vitamin D deficiency 06/06/2008   Hyperlipidemia 06/06/2008   MENOPAUSAL SYNDROME 03/29/2008   Osteopenia 03/29/2008   Allergic rhinitis 01/25/2008   IBS 01/25/2008   OSTEOARTHRITIS 01/25/2008   FIBROMYALGIA 01/25/2008    Sumner Boast, PT 07/02/2021, 2:33 PM  [place]. [place], [place], [address] Phone: [phone]   Fax:  [phone]  Name: Linda Mcgrath MRN: [id number] Date of Birth: [birth year]

## 2021-07-05 DIAGNOSIS — H35453 Secondary pigmentary degeneration, bilateral: Secondary | ICD-10-CM | POA: Diagnosis not present

## 2021-07-05 DIAGNOSIS — H35372 Puckering of macula, left eye: Secondary | ICD-10-CM | POA: Diagnosis not present

## 2021-07-05 DIAGNOSIS — Z961 Presence of intraocular lens: Secondary | ICD-10-CM | POA: Diagnosis not present

## 2021-07-05 DIAGNOSIS — H33322 Round hole, left eye: Secondary | ICD-10-CM | POA: Diagnosis not present

## 2021-07-05 DIAGNOSIS — H33312 Horseshoe tear of retina without detachment, left eye: Secondary | ICD-10-CM | POA: Diagnosis not present

## 2021-07-05 DIAGNOSIS — H31092 Other chorioretinal scars, left eye: Secondary | ICD-10-CM | POA: Diagnosis not present

## 2021-07-09 ENCOUNTER — Encounter: Payer: Self-pay | Admitting: Physical Therapy

## 2021-07-09 ENCOUNTER — Other Ambulatory Visit: Payer: Self-pay

## 2021-07-09 ENCOUNTER — Ambulatory Visit: Payer: Medicare Other | Admitting: Physical Therapy

## 2021-07-09 DIAGNOSIS — M545 Low back pain, unspecified: Secondary | ICD-10-CM | POA: Diagnosis not present

## 2021-07-09 DIAGNOSIS — G8929 Other chronic pain: Secondary | ICD-10-CM

## 2021-07-09 DIAGNOSIS — R262 Difficulty in walking, not elsewhere classified: Secondary | ICD-10-CM | POA: Diagnosis not present

## 2021-07-09 DIAGNOSIS — M542 Cervicalgia: Secondary | ICD-10-CM | POA: Diagnosis not present

## 2021-07-09 DIAGNOSIS — M25561 Pain in right knee: Secondary | ICD-10-CM | POA: Diagnosis not present

## 2021-07-09 DIAGNOSIS — M62830 Muscle spasm of back: Secondary | ICD-10-CM

## 2021-07-09 NOTE — Therapy (Signed)
Duodenitis 03/13/2020   Right lateral abdominal pain 03/07/2020   Insomnia  12/15/2019   Chronic low back pain 10/18/2019   Positive depression screening 10/18/2019   Osteoarthritis of right knee 08/02/2019   Diarrhea 07/03/2018   Dyspepsia 07/03/2018   S/P lumbar spinal fusion 07/31/2017   Right low back pain 03/04/2017   H/O atrial flutter 03/06/2016   Obesity 11/06/2015   Urge incontinence 01/24/2015   Estrogen deficiency 07/17/6243   Lichen planus 69/50/7225   Encounter for Medicare annual wellness exam 06/17/2013   Screening mammogram, encounter for 06/15/2012   Prediabetes 06/03/2011   HYPERTENSION, BENIGN ESSENTIAL 10/23/2010   OSTEOARTHRITIS, HANDS, BILATERAL 01/24/2010   Hypothyroidism 08/24/2008   Vitamin D deficiency 06/06/2008   Hyperlipidemia 06/06/2008   MENOPAUSAL SYNDROME 03/29/2008   Osteopenia 03/29/2008   Allergic rhinitis 01/25/2008   IBS 01/25/2008   OSTEOARTHRITIS 01/25/2008   FIBROMYALGIA 01/25/2008    Sumner Boast, PT 07/09/2021, 2:36 PM  [place]. [place], [place], [address] Phone: [phone]   Fax:  [phone]  Name: GRISELDA BRAMBLETT MRN: [id number] Date of Birth: Dec 09, 1932  [place]. [place], [place], [address] Phone: [phone]   Fax:  [phone]  Physical Therapy Treatment  Patient Details  Name: Linda Mcgrath MRN: [id number] Date of Birth: [birth year] Referring Provider (PT): [name]   Encounter Date: 07/09/2021   PT End of Session - 07/09/21 1432     Visit Number 7    Date for PT Re-Evaluation 08/16/21    Authorization Type Medicare    PT Start Time 1404    PT Stop Time 4982    PT Time Calculation (min) 43 min    Activity Tolerance Patient tolerated treatment well    Behavior During Therapy [facility] for tasks assessed/performed             Past Medical History:  Diagnosis Date   Allergic rhinitis    Arthritis    Asthma    Cataract    Bil/lens implant   Colon polyp 1999   small polyp   Diverticulosis    Dysrhythmia    Fibromyalgia    Hyperlipidemia    Hypothyroidism    Internal hemorrhoids    Leg cramps    Lichen planus    Lung nodule    stable LUL 9 mm   Menopausal syndrome    Osteoarthritis    UTI (urinary tract infection)     Past Surgical History:  Procedure Laterality Date   Abd U/S  11/1998   negative   Abd U/S  01/2001   gallbladder polyps   ABDOMINAL HYSTERECTOMY  1975   total-fibroids   APPENDECTOMY  1975   BREAST BIOPSY     benign   BREAST EXCISIONAL BIOPSY Left 1957   CARDIOVERSION N/A 03/07/2016   Procedure: CARDIOVERSION;  Surgeon: Adrian Prows, MD;  Location: [facility];  Service: Cardiovascular;  Laterality: N/A;   CATARACT EXTRACTION     bilateral   CHOLECYSTECTOMY     COLONOSCOPY  1998   Diverticulosis; polyp\   COLONOSCOPY  09/2002   Diverticulosis, hem   COLONOSCOPY  12/2007   diverticulosis, polyp   DEXA  10/2001   osteopenia   ELECTROPHYSIOLOGIC STUDY N/A 04/03/2016   Procedure: A-Flutter Ablation;  Surgeon: Will Meredith Leeds, MD;  Location: [facility];  Service: Cardiovascular;  Laterality: N/A;    ESOPHAGOGASTRODUODENOSCOPY  2004   Hida scan  01/2001   Negative   KNEE SURGERY     right knee / 11/2004   LAMINECTOMY WITH POSTERIOR LATERAL ARTHRODESIS LEVEL 2 N/A 07/31/2017   Procedure: DECOMPRESSIVE LAMINECTOMY LUMABR FOUR-FIVE, LUMBAR FIVE-SACRAL ONE;  Surgeon: Eustace Moore, MD;  Location: [facility];  Service: Neurosurgery;  Laterality: N/A;   laser surgery for glaucoma Left Sep 21, 2014   MULTIPLE TOOTH EXTRACTIONS     ROTATOR CUFF REPAIR     x 2 /right shoulder   SKIN CANCER EXCISION     pre-melanoma / on face   TEE WITHOUT CARDIOVERSION N/A 03/07/2016   Procedure: TRANSESOPHAGEAL ECHOCARDIOGRAM (TEE);  Surgeon: Adrian Prows, MD;  Location: [facility];  Service: Cardiovascular;  Laterality: N/A;   TOE SURGERY     rt foot     There were no vitals filed for this visit.   Subjective Assessment - 07/09/21 1407     Subjective A little hurried was at bank and it took longer than they tld me it would.    Currently in Pain? Yes    Pain Score 4     Pain Location Back  [place]. [place], [place], [address] Phone: [phone]   Fax:  [phone]  Physical Therapy Treatment  Patient Details  Name: Linda Mcgrath MRN: [id number] Date of Birth: [birth year] Referring Provider (PT): [name]   Encounter Date: 07/09/2021   PT End of Session - 07/09/21 1432     Visit Number 7    Date for PT Re-Evaluation 08/16/21    Authorization Type Medicare    PT Start Time 1404    PT Stop Time 4982    PT Time Calculation (min) 43 min    Activity Tolerance Patient tolerated treatment well    Behavior During Therapy [facility] for tasks assessed/performed             Past Medical History:  Diagnosis Date   Allergic rhinitis    Arthritis    Asthma    Cataract    Bil/lens implant   Colon polyp 1999   small polyp   Diverticulosis    Dysrhythmia    Fibromyalgia    Hyperlipidemia    Hypothyroidism    Internal hemorrhoids    Leg cramps    Lichen planus    Lung nodule    stable LUL 9 mm   Menopausal syndrome    Osteoarthritis    UTI (urinary tract infection)     Past Surgical History:  Procedure Laterality Date   Abd U/S  11/1998   negative   Abd U/S  01/2001   gallbladder polyps   ABDOMINAL HYSTERECTOMY  1975   total-fibroids   APPENDECTOMY  1975   BREAST BIOPSY     benign   BREAST EXCISIONAL BIOPSY Left 1957   CARDIOVERSION N/A 03/07/2016   Procedure: CARDIOVERSION;  Surgeon: Adrian Prows, MD;  Location: [facility];  Service: Cardiovascular;  Laterality: N/A;   CATARACT EXTRACTION     bilateral   CHOLECYSTECTOMY     COLONOSCOPY  1998   Diverticulosis; polyp\   COLONOSCOPY  09/2002   Diverticulosis, hem   COLONOSCOPY  12/2007   diverticulosis, polyp   DEXA  10/2001   osteopenia   ELECTROPHYSIOLOGIC STUDY N/A 04/03/2016   Procedure: A-Flutter Ablation;  Surgeon: Will Meredith Leeds, MD;  Location: [facility];  Service: Cardiovascular;  Laterality: N/A;    ESOPHAGOGASTRODUODENOSCOPY  2004   Hida scan  01/2001   Negative   KNEE SURGERY     right knee / 11/2004   LAMINECTOMY WITH POSTERIOR LATERAL ARTHRODESIS LEVEL 2 N/A 07/31/2017   Procedure: DECOMPRESSIVE LAMINECTOMY LUMABR FOUR-FIVE, LUMBAR FIVE-SACRAL ONE;  Surgeon: Eustace Moore, MD;  Location: [facility];  Service: Neurosurgery;  Laterality: N/A;   laser surgery for glaucoma Left Sep 21, 2014   MULTIPLE TOOTH EXTRACTIONS     ROTATOR CUFF REPAIR     x 2 /right shoulder   SKIN CANCER EXCISION     pre-melanoma / on face   TEE WITHOUT CARDIOVERSION N/A 03/07/2016   Procedure: TRANSESOPHAGEAL ECHOCARDIOGRAM (TEE);  Surgeon: Adrian Prows, MD;  Location: [facility];  Service: Cardiovascular;  Laterality: N/A;   TOE SURGERY     rt foot     There were no vitals filed for this visit.   Subjective Assessment - 07/09/21 1407     Subjective A little hurried was at bank and it took longer than they tld me it would.    Currently in Pain? Yes    Pain Score 4     Pain Location Back

## 2021-07-16 ENCOUNTER — Ambulatory Visit: Payer: Medicare Other | Admitting: Physical Therapy

## 2021-07-20 ENCOUNTER — Other Ambulatory Visit: Payer: Self-pay | Admitting: Internal Medicine

## 2021-07-23 ENCOUNTER — Ambulatory Visit: Payer: Medicare Other | Admitting: Physical Therapy

## 2021-07-23 ENCOUNTER — Other Ambulatory Visit: Payer: Self-pay

## 2021-07-23 ENCOUNTER — Encounter: Payer: Self-pay | Admitting: Physical Therapy

## 2021-07-23 DIAGNOSIS — M545 Low back pain, unspecified: Secondary | ICD-10-CM | POA: Diagnosis not present

## 2021-07-23 DIAGNOSIS — M542 Cervicalgia: Secondary | ICD-10-CM | POA: Diagnosis not present

## 2021-07-23 DIAGNOSIS — G8929 Other chronic pain: Secondary | ICD-10-CM

## 2021-07-23 DIAGNOSIS — R262 Difficulty in walking, not elsewhere classified: Secondary | ICD-10-CM

## 2021-07-23 DIAGNOSIS — M25561 Pain in right knee: Secondary | ICD-10-CM | POA: Diagnosis not present

## 2021-07-23 DIAGNOSIS — M62830 Muscle spasm of back: Secondary | ICD-10-CM | POA: Diagnosis not present

## 2021-07-23 NOTE — Therapy (Signed)
Diagnosis Date Noted   Chronic upper back pain 08/30/2020   Aortic atherosclerosis ([place]) 05/31/2020   Abnormal CT scan, small bowel 05/18/2020    Generalized abdominal pain 05/17/2020   Duodenitis 03/13/2020   Right lateral abdominal pain 03/07/2020   Insomnia 12/15/2019   Chronic low back pain 10/18/2019   Positive depression screening 10/18/2019   Osteoarthritis of right knee 08/02/2019   Diarrhea 07/03/2018   Dyspepsia 07/03/2018   S/P lumbar spinal fusion 07/31/2017   Right low back pain 03/04/2017   H/O atrial flutter 03/06/2016   Obesity 11/06/2015   Urge incontinence 01/24/2015   Estrogen deficiency 61/60/7371   Lichen planus 02/19/9484   Encounter for Medicare annual wellness exam 06/17/2013   Screening mammogram, encounter for 06/15/2012   Prediabetes 06/03/2011   HYPERTENSION, BENIGN ESSENTIAL 10/23/2010   OSTEOARTHRITIS, HANDS, BILATERAL 01/24/2010   Hypothyroidism 08/24/2008   Vitamin D deficiency 06/06/2008   Hyperlipidemia 06/06/2008   MENOPAUSAL SYNDROME 03/29/2008   Osteopenia 03/29/2008   Allergic rhinitis 01/25/2008   IBS 01/25/2008   OSTEOARTHRITIS 01/25/2008   FIBROMYALGIA 01/25/2008    Sumner Boast, PT 07/23/2021, 2:33 PM  [place]. [place], [place], [address] Phone: [phone]   Fax:  [phone]  Name: Linda Mcgrath MRN: [id number] Date of Birth: 10/23/1932  [place]. [place], [place], [address] Phone: [phone]   Fax:  [phone]  Physical Therapy Treatment  Patient Details  Name: Linda Mcgrath MRN: [id number] Date of Birth: [birth year] Referring Provider (PT): [name]   Encounter Date: 07/23/2021   PT End of Session - 07/23/21 1431     Visit Number 8    Date for PT Re-Evaluation 08/16/21    Authorization Type Medicare    PT Start Time 1354    PT Stop Time 1444    PT Time Calculation (min) 50 min    Activity Tolerance Patient tolerated treatment well    Behavior During Therapy [facility] for tasks assessed/performed             Past Medical History:  Diagnosis Date   Allergic rhinitis    Arthritis    Asthma    Cataract    Bil/lens implant   Colon polyp 1999   small polyp   Diverticulosis    Dysrhythmia    Fibromyalgia    Hyperlipidemia    Hypothyroidism    Internal hemorrhoids    Leg cramps    Lichen planus    Lung nodule    stable LUL 9 mm   Menopausal syndrome    Osteoarthritis    UTI (urinary tract infection)     Past Surgical History:  Procedure Laterality Date   Abd U/S  11/1998   negative   Abd U/S  01/2001   gallbladder polyps   ABDOMINAL HYSTERECTOMY  1975   total-fibroids   APPENDECTOMY  1975   BREAST BIOPSY     benign   BREAST EXCISIONAL BIOPSY Left 1957   CARDIOVERSION N/A 03/07/2016   Procedure: CARDIOVERSION;  Surgeon: Adrian Prows, MD;  Location: [facility];  Service: Cardiovascular;  Laterality: N/A;   CATARACT EXTRACTION     bilateral   CHOLECYSTECTOMY     COLONOSCOPY  1998   Diverticulosis; polyp\   COLONOSCOPY  09/2002   Diverticulosis, hem   COLONOSCOPY  12/2007   diverticulosis, polyp   DEXA  10/2001   osteopenia   ELECTROPHYSIOLOGIC STUDY N/A 04/03/2016   Procedure: A-Flutter Ablation;  Surgeon: Will Meredith Leeds, MD;  Location: [facility];  Service: Cardiovascular;  Laterality: N/A;    ESOPHAGOGASTRODUODENOSCOPY  2004   Hida scan  01/2001   Negative   KNEE SURGERY     right knee / 11/2004   LAMINECTOMY WITH POSTERIOR LATERAL ARTHRODESIS LEVEL 2 N/A 07/31/2017   Procedure: DECOMPRESSIVE LAMINECTOMY LUMABR FOUR-FIVE, LUMBAR FIVE-SACRAL ONE;  Surgeon: Eustace Moore, MD;  Location: [facility];  Service: Neurosurgery;  Laterality: N/A;   laser surgery for glaucoma Left Sep 21, 2014   MULTIPLE TOOTH EXTRACTIONS     ROTATOR CUFF REPAIR     x 2 /right shoulder   SKIN CANCER EXCISION     pre-melanoma / on face   TEE WITHOUT CARDIOVERSION N/A 03/07/2016   Procedure: TRANSESOPHAGEAL ECHOCARDIOGRAM (TEE);  Surgeon: Adrian Prows, MD;  Location: [facility];  Service: Cardiovascular;  Laterality: N/A;   TOE SURGERY     rt foot     There were no vitals filed for this visit.   Subjective Assessment - 07/23/21 1403     Subjective I am doing okay, really have not done much, feel a little weak and am still hurting    Currently in Pain? Yes    Pain Score 5     Pain Location Back  Diagnosis Date Noted   Chronic upper back pain 08/30/2020   Aortic atherosclerosis ([place]) 05/31/2020   Abnormal CT scan, small bowel 05/18/2020    Generalized abdominal pain 05/17/2020   Duodenitis 03/13/2020   Right lateral abdominal pain 03/07/2020   Insomnia 12/15/2019   Chronic low back pain 10/18/2019   Positive depression screening 10/18/2019   Osteoarthritis of right knee 08/02/2019   Diarrhea 07/03/2018   Dyspepsia 07/03/2018   S/P lumbar spinal fusion 07/31/2017   Right low back pain 03/04/2017   H/O atrial flutter 03/06/2016   Obesity 11/06/2015   Urge incontinence 01/24/2015   Estrogen deficiency 61/60/7371   Lichen planus 02/19/9484   Encounter for Medicare annual wellness exam 06/17/2013   Screening mammogram, encounter for 06/15/2012   Prediabetes 06/03/2011   HYPERTENSION, BENIGN ESSENTIAL 10/23/2010   OSTEOARTHRITIS, HANDS, BILATERAL 01/24/2010   Hypothyroidism 08/24/2008   Vitamin D deficiency 06/06/2008   Hyperlipidemia 06/06/2008   MENOPAUSAL SYNDROME 03/29/2008   Osteopenia 03/29/2008   Allergic rhinitis 01/25/2008   IBS 01/25/2008   OSTEOARTHRITIS 01/25/2008   FIBROMYALGIA 01/25/2008    Sumner Boast, PT 07/23/2021, 2:33 PM  [place]. [place], [place], [address] Phone: [phone]   Fax:  [phone]  Name: Linda Mcgrath MRN: [id number] Date of Birth: 10/23/1932

## 2021-07-31 ENCOUNTER — Encounter: Payer: Self-pay | Admitting: Physical Therapy

## 2021-07-31 ENCOUNTER — Other Ambulatory Visit: Payer: Self-pay

## 2021-07-31 ENCOUNTER — Ambulatory Visit: Payer: Medicare Other | Attending: Family Medicine | Admitting: Physical Therapy

## 2021-07-31 DIAGNOSIS — R262 Difficulty in walking, not elsewhere classified: Secondary | ICD-10-CM | POA: Diagnosis not present

## 2021-07-31 DIAGNOSIS — M545 Low back pain, unspecified: Secondary | ICD-10-CM | POA: Insufficient documentation

## 2021-07-31 DIAGNOSIS — M542 Cervicalgia: Secondary | ICD-10-CM | POA: Insufficient documentation

## 2021-07-31 DIAGNOSIS — M62830 Muscle spasm of back: Secondary | ICD-10-CM | POA: Insufficient documentation

## 2021-07-31 DIAGNOSIS — H401131 Primary open-angle glaucoma, bilateral, mild stage: Secondary | ICD-10-CM | POA: Diagnosis not present

## 2021-07-31 NOTE — Therapy (Signed)
[place]. [place], [place], [address] Phone: [phone]   Fax:  [phone]  Physical Therapy Treatment  Patient Details  Name: Linda Mcgrath MRN: [id number] Date of Birth: May 20, 1933 Referring Provider (PT): [name]   Encounter Date: 07/31/2021   PT End of Session - 07/31/21 1335     Visit Number 9    Date for PT Re-Evaluation 08/16/21    Authorization Type Medicare    PT Start Time 1300    PT Stop Time 1350    PT Time Calculation (min) 50 min    Activity Tolerance Patient tolerated treatment well    Behavior During Therapy [facility] for tasks assessed/performed             Past Medical History:  Diagnosis Date   Allergic rhinitis    Arthritis    Asthma    Cataract    Bil/lens implant   Colon polyp 1999   small polyp   Diverticulosis    Dysrhythmia    Fibromyalgia    Hyperlipidemia    Hypothyroidism    Internal hemorrhoids    Leg cramps    Lichen planus    Lung nodule    stable LUL 9 mm   Menopausal syndrome    Osteoarthritis    UTI (urinary tract infection)     Past Surgical History:  Procedure Laterality Date   Abd U/S  11/1998   negative   Abd U/S  01/2001   gallbladder polyps   ABDOMINAL HYSTERECTOMY  1975   total-fibroids   APPENDECTOMY  1975   BREAST BIOPSY     benign   BREAST EXCISIONAL BIOPSY Left 1957   CARDIOVERSION N/A 03/07/2016   Procedure: CARDIOVERSION;  Surgeon: Adrian Prows, MD;  Location: [facility];  Service: Cardiovascular;  Laterality: N/A;   CATARACT EXTRACTION     bilateral   CHOLECYSTECTOMY     COLONOSCOPY  1998   Diverticulosis; polyp\   COLONOSCOPY  09/2002   Diverticulosis, hem   COLONOSCOPY  12/2007   diverticulosis, polyp   DEXA  10/2001   osteopenia   ELECTROPHYSIOLOGIC STUDY N/A 04/03/2016   Procedure: A-Flutter Ablation;  Surgeon: Will Meredith Leeds, MD;  Location: [facility];  Service: Cardiovascular;  Laterality: N/A;    ESOPHAGOGASTRODUODENOSCOPY  2004   Hida scan  01/2001   Negative   KNEE SURGERY     right knee / 11/2004   LAMINECTOMY WITH POSTERIOR LATERAL ARTHRODESIS LEVEL 2 N/A 07/31/2017   Procedure: DECOMPRESSIVE LAMINECTOMY LUMABR FOUR-FIVE, LUMBAR FIVE-SACRAL ONE;  Surgeon: Eustace Moore, MD;  Location: [facility];  Service: Neurosurgery;  Laterality: N/A;   laser surgery for glaucoma Left Sep 21, 2014   MULTIPLE TOOTH EXTRACTIONS     ROTATOR CUFF REPAIR     x 2 /right shoulder   SKIN CANCER EXCISION     pre-melanoma / on face   TEE WITHOUT CARDIOVERSION N/A 03/07/2016   Procedure: TRANSESOPHAGEAL ECHOCARDIOGRAM (TEE);  Surgeon: Adrian Prows, MD;  Location: [facility];  Service: Cardiovascular;  Laterality: N/A;   TOE SURGERY     rt foot     There were no vitals filed for this visit.   Subjective Assessment - 07/31/21 1302     Subjective I am stiff and sore, hurting a little more the weather has something to do with it.  She does report has an eye appointment this afternoon    Currently in Pain? Yes    Pain Score  [place]. [place], [place], [address] Phone: [phone]   Fax:  [phone]  Physical Therapy Treatment  Patient Details  Name: Linda Mcgrath MRN: [id number] Date of Birth: May 20, 1933 Referring Provider (PT): [name]   Encounter Date: 07/31/2021   PT End of Session - 07/31/21 1335     Visit Number 9    Date for PT Re-Evaluation 08/16/21    Authorization Type Medicare    PT Start Time 1300    PT Stop Time 1350    PT Time Calculation (min) 50 min    Activity Tolerance Patient tolerated treatment well    Behavior During Therapy [facility] for tasks assessed/performed             Past Medical History:  Diagnosis Date   Allergic rhinitis    Arthritis    Asthma    Cataract    Bil/lens implant   Colon polyp 1999   small polyp   Diverticulosis    Dysrhythmia    Fibromyalgia    Hyperlipidemia    Hypothyroidism    Internal hemorrhoids    Leg cramps    Lichen planus    Lung nodule    stable LUL 9 mm   Menopausal syndrome    Osteoarthritis    UTI (urinary tract infection)     Past Surgical History:  Procedure Laterality Date   Abd U/S  11/1998   negative   Abd U/S  01/2001   gallbladder polyps   ABDOMINAL HYSTERECTOMY  1975   total-fibroids   APPENDECTOMY  1975   BREAST BIOPSY     benign   BREAST EXCISIONAL BIOPSY Left 1957   CARDIOVERSION N/A 03/07/2016   Procedure: CARDIOVERSION;  Surgeon: Adrian Prows, MD;  Location: [place];  Service: Cardiovascular;  Laterality: N/A;   CATARACT EXTRACTION     bilateral   CHOLECYSTECTOMY     COLONOSCOPY  1998   Diverticulosis; polyp\   COLONOSCOPY  09/2002   Diverticulosis, hem   COLONOSCOPY  12/2007   diverticulosis, polyp   DEXA  10/2001   osteopenia   ELECTROPHYSIOLOGIC STUDY N/A 04/03/2016   Procedure: A-Flutter Ablation;  Surgeon: Will Meredith Leeds, MD;  Location: [facility];  Service: Cardiovascular;  Laterality: N/A;    ESOPHAGOGASTRODUODENOSCOPY  2004   Hida scan  01/2001   Negative   KNEE SURGERY     right knee / 11/2004   LAMINECTOMY WITH POSTERIOR LATERAL ARTHRODESIS LEVEL 2 N/A 07/31/2017   Procedure: DECOMPRESSIVE LAMINECTOMY LUMABR FOUR-FIVE, LUMBAR FIVE-SACRAL ONE;  Surgeon: Eustace Moore, MD;  Location: [facility];  Service: Neurosurgery;  Laterality: N/A;   laser surgery for glaucoma Left Sep 21, 2014   MULTIPLE TOOTH EXTRACTIONS     ROTATOR CUFF REPAIR     x 2 /right shoulder   SKIN CANCER EXCISION     pre-melanoma / on face   TEE WITHOUT CARDIOVERSION N/A 03/07/2016   Procedure: TRANSESOPHAGEAL ECHOCARDIOGRAM (TEE);  Surgeon: Adrian Prows, MD;  Location: [facility] [place];  Service: Cardiovascular;  Laterality: N/A;   TOE SURGERY     rt foot     There were no vitals filed for this visit.   Subjective Assessment - 07/31/21 1302     Subjective I am stiff and sore, hurting a little more the weather has something to do with it.  She does report has an eye appointment this afternoon    Currently in Pain? Yes    Pain Score  Abnormal CT scan, small bowel 05/18/2020   Generalized abdominal pain 05/17/2020    Duodenitis 03/13/2020   Right lateral abdominal pain 03/07/2020   Insomnia 12/15/2019   Chronic low back pain 10/18/2019   Positive depression screening 10/18/2019   Osteoarthritis of right knee 08/02/2019   Diarrhea 07/03/2018   Dyspepsia 07/03/2018   S/P lumbar spinal fusion 07/31/2017   Right low back pain 03/04/2017   H/O atrial flutter 03/06/2016   Obesity 11/06/2015   Urge incontinence 01/24/2015   Estrogen deficiency 61/53/7943   Lichen planus 27/61/4709   Encounter for Medicare annual wellness exam 06/17/2013   Screening mammogram, encounter for 06/15/2012   Prediabetes 06/03/2011   HYPERTENSION, BENIGN ESSENTIAL 10/23/2010   OSTEOARTHRITIS, HANDS, BILATERAL 01/24/2010   Hypothyroidism 08/24/2008   Vitamin D deficiency 06/06/2008   Hyperlipidemia 06/06/2008   MENOPAUSAL SYNDROME 03/29/2008   Osteopenia 03/29/2008   Allergic rhinitis 01/25/2008   IBS 01/25/2008   OSTEOARTHRITIS 01/25/2008   FIBROMYALGIA 01/25/2008    Sumner Boast, PT 07/31/2021, 1:39 PM  [place]. [place], [place], [address] Phone: [phone]   Fax:  [phone]  Name: Linda Mcgrath MRN: [id number] Date of Birth: [birth year]

## 2021-08-07 ENCOUNTER — Ambulatory Visit: Payer: Medicare Other | Admitting: Physical Therapy

## 2021-08-11 ENCOUNTER — Ambulatory Visit
Admission: EM | Admit: 2021-08-11 | Discharge: 2021-08-11 | Disposition: A | Discharge status: Home / Self Care | Payer: Medicare Other | Attending: Internal Medicine | Admitting: Internal Medicine

## 2021-08-11 ENCOUNTER — Other Ambulatory Visit: Payer: Self-pay

## 2021-08-11 ENCOUNTER — Encounter: Payer: Self-pay | Admitting: Emergency Medicine

## 2021-08-11 ENCOUNTER — Ambulatory Visit (INDEPENDENT_AMBULATORY_CARE_PROVIDER_SITE_OTHER): Payer: Medicare Other

## 2021-08-11 DIAGNOSIS — R059 Cough, unspecified: Secondary | ICD-10-CM | POA: Diagnosis not present

## 2021-08-11 DIAGNOSIS — J069 Acute upper respiratory infection, unspecified: Secondary | ICD-10-CM

## 2021-08-11 MED ORDER — FLUTICASONE PROPIONATE 50 MCG/ACT NA SUSP: 1.0000 | Freq: Every day | NASAL | 0 refills | Status: DC

## 2021-08-11 MED ORDER — BENZONATATE 100 MG PO CAPS: 100.0000 mg | ORAL_CAPSULE | Freq: Three times a day (TID) | ORAL | 0 refills | Status: DC | PRN

## 2021-08-11 NOTE — ED Triage Notes (Signed)
Cough with productive green mucus, nasal congestion, tenderness across maxillary sinuses and cervical lymphnodes, body aches, diarrhea x 3 days. Denies fever, nausea, vomiting.

## 2021-08-11 NOTE — ED Provider Notes (Signed)
[facility]    CSN: [id number] Arrival date & time: 08/11/21  0915      History   Chief Complaint Chief Complaint  Patient presents with   Cough    HPI Linda Mcgrath is a 87 y.o. female.   Patient presents with cough, nasal congestion, facial pressure and pain, body aches, diarrhea that started approximately 3 days ago.  Patient reports the cough is productive with green mucus.  Denies any known fevers or sick contacts.  Denies nausea, vomiting, chest pain, shortness of breath abdominal pain.  Patient has taken Mucinex with minimal improvement in symptoms.   Cough  Past Medical History:  Diagnosis Date   Allergic rhinitis    Arthritis    Asthma    Cataract    Bil/lens implant   Colon polyp 1999   small polyp   Diverticulosis    Dysrhythmia    Fibromyalgia    Hyperlipidemia    Hypothyroidism    Internal hemorrhoids    Leg cramps    Lichen planus    Lung nodule    stable LUL 9 mm   Menopausal syndrome    Osteoarthritis    UTI (urinary tract infection)     Patient Active Problem List   Diagnosis Date Noted   Chronic upper back pain 08/30/2020   Aortic atherosclerosis ([place]) 05/31/2020   Abnormal CT scan, small bowel 05/18/2020   Generalized abdominal pain 05/17/2020   Duodenitis 03/13/2020   Right lateral abdominal pain 03/07/2020   Insomnia 12/15/2019   Chronic low back pain 10/18/2019   Positive depression screening 10/18/2019   Osteoarthritis of right knee 08/02/2019   Diarrhea 07/03/2018   Dyspepsia 07/03/2018   S/P lumbar spinal fusion 07/31/2017   Right low back pain 03/04/2017   H/O atrial flutter 03/06/2016   Obesity 11/06/2015   Urge incontinence 01/24/2015   Estrogen deficiency 60/05/9322   Lichen planus 55/73/2202   Encounter for Medicare annual wellness exam 06/17/2013   Screening mammogram, encounter for 06/15/2012   Prediabetes 06/03/2011   HYPERTENSION, BENIGN ESSENTIAL 10/23/2010   OSTEOARTHRITIS, HANDS,  BILATERAL 01/24/2010   Hypothyroidism 08/24/2008   Vitamin D deficiency 06/06/2008   Hyperlipidemia 06/06/2008   MENOPAUSAL SYNDROME 03/29/2008   Osteopenia 03/29/2008   Allergic rhinitis 01/25/2008   IBS 01/25/2008   OSTEOARTHRITIS 01/25/2008   FIBROMYALGIA 01/25/2008    Past Surgical History:  Procedure Laterality Date   Abd U/S  11/1998   negative   Abd U/S  01/2001   gallbladder polyps   ABDOMINAL HYSTERECTOMY  1975   total-fibroids   APPENDECTOMY  1975   BREAST BIOPSY     benign   BREAST EXCISIONAL BIOPSY Left 1957   CARDIOVERSION N/A 03/07/2016   Procedure: CARDIOVERSION;  Surgeon: Adrian Prows, MD;  Location: [facility];  Service: Cardiovascular;  Laterality: N/A;   CATARACT EXTRACTION     bilateral   CHOLECYSTECTOMY     COLONOSCOPY  1998   Diverticulosis; polyp\   COLONOSCOPY  09/2002   Diverticulosis, hem   COLONOSCOPY  12/2007   diverticulosis, polyp   DEXA  10/2001   osteopenia   ELECTROPHYSIOLOGIC STUDY N/A 04/03/2016   Procedure: A-Flutter Ablation;  Surgeon: Will Meredith Leeds, MD;  Location: [facility];  Service: Cardiovascular;  Laterality: N/A;   ESOPHAGOGASTRODUODENOSCOPY  2004   [place] scan  01/2001   Negative   KNEE SURGERY     right knee / 11/2004   LAMINECTOMY WITH POSTERIOR LATERAL ARTHRODESIS LEVEL 2 N/A 07/31/2017  Procedure: DECOMPRESSIVE LAMINECTOMY LUMABR FOUR-FIVE, LUMBAR FIVE-SACRAL ONE;  Surgeon: Eustace Moore, MD;  Location: [facility];  Service: Neurosurgery;  Laterality: N/A;   laser surgery for glaucoma Left Sep 21, 2014   MULTIPLE TOOTH EXTRACTIONS     ROTATOR CUFF REPAIR     x 2 /right shoulder   SKIN CANCER EXCISION     pre-melanoma / on face   TEE WITHOUT CARDIOVERSION N/A 03/07/2016   Procedure: TRANSESOPHAGEAL ECHOCARDIOGRAM (TEE);  Surgeon: Adrian Prows, MD;  Location: [facility];  Service: Cardiovascular;  Laterality: N/A;   TOE SURGERY     rt foot     OB History   No obstetric history on file.      Home Medications     Prior to Admission medications   Medication Sig Start Date End Date Taking? Authorizing Provider  benzonatate (TESSALON) 100 MG capsule Take 1 capsule (100 mg total) by mouth every 8 (eight) hours as needed for cough. 08/11/21  Yes , Hildred Alamin E, FNP  fluticasone (FLONASE) 50 MCG/ACT nasal spray Place 1 spray into both nostrils daily for 3 days. 08/11/21 08/14/21 Yes , Michele Rockers, FNP  Ascorbic Acid (VITAMIN C PO) Take 500 mg by mouth daily.    [provider]  B Complex Vitamins (VITAMIN B COMPLEX PO) Take 1 tablet by mouth daily.    [provider]  B COMPLEX, FOLIC ACID, PO Take 235 mcg by mouth daily.    [provider]  Biotin 10 MG TABS Take 1 tablet by mouth every morning.     [provider]  Calcium Citrate-Vitamin D 250-200 MG-UNIT TABS Take 1 tablet by mouth daily.    [provider]  cholestyramine Lucrezia Starch) 4 g packet TAKE 1 PACKET DAILY 07/23/21   Irene Shipper, MD  Cranberry 180 MG CAPS Take 2 capsules by mouth daily.    [provider]  dextromethorphan-guaiFENesin (MUCINEX DM) 30-600 MG 12hr tablet Take 1 tablet by mouth 2 (two) times daily. 03/16/21   Wieters, Hallie C, PA-C  famotidine (PEPCID) 20 MG tablet Take 1 tablet (20 mg total) by mouth 2 (two) times daily. 07/03/18   [name], Wynelle Fanny, MD  Fexofenadine HCl [facility] ALLERGY PO) Take 1 tablet by mouth daily as needed (Allergies).     [provider]  folic acid (FOLVITE) 1 MG tablet Take 1 mg by mouth daily.    [provider]  levothyroxine (SYNTHROID) 88 MCG tablet Take 1 tablet (88 mcg total) by mouth daily with breakfast. 08/31/20   [name], Wynelle Fanny, MD  loratadine (CLARITIN) 10 MG tablet Take 10 mg by mouth daily as needed for allergies.     [provider]  MAGNESIUM-OXIDE PO Take 1 tablet by mouth daily.    [provider]  Methenamine-Sodium Salicylate (AZO URINARY TRACT DEFENSE PO) Take 1 capsule by mouth daily.    [provider]  methocarbamol (ROBAXIN) 500 MG tablet TAKE 1 TABLET (500 MG TOTAL) BY MOUTH EVERY 8 (EIGHT) HOURS AS NEEDED FOR MUSCLE SPASMS. CAUTION OF SEDATION 12/21/19   [name], Wynelle Fanny, MD  Multiple Vitamin (MULTIVITAMIN) capsule Take 1 capsule by mouth daily.    [provider]  mupirocin ointment (BACTROBAN) 2 % Apply 1 application topically 2 (two) times daily. 03/16/21   Wieters, Hallie C, PA-C  Psyllium (METAMUCIL FIBER PO) Take by mouth at bedtime. 2 tablespoon    [provider]  Pumpkin Seed-Soy Germ (AZO BLADDER CONTROL/GO-LESS PO) Take 1 tablet by mouth  2 (two) times a week.    [provider]  solifenacin (VESICARE) 5 MG tablet Take 1 tablet (5 mg total) by mouth daily. 01/03/21   [name], Wynelle Fanny, MD  TURMERIC PO Take 1 capsule by mouth 2 (two) times daily.     [provider]  vitamin B-12 (CYANOCOBALAMIN) 500 MCG tablet Take 500 mcg by mouth daily.    [provider]  Vitamin D, Cholecalciferol, 50 MCG (2000 UT) CAPS Take 2 tablets by mouth daily.    [provider]    Family History Family History  Problem Relation Age of Onset   Parkinsonism Father    Colon cancer Brother    Allergies Mother    Heart disease Mother    Kidney failure Son        congenital  (kidney transplant)    Heart failure Son        died suddenly of CHF     Social History Social History   Tobacco Use   Smoking status: Never   Smokeless tobacco: Never  Vaping Use   Vaping Use: Never used  Substance Use Topics   Alcohol use: No    Alcohol/week: 0.0 standard drinks    Comment: occasional-rare   Drug use: No     Allergies   Shellfish allergy, Tetracycline, Amlodipine besylate, Ciprofloxacin, Codeine, Fosamax [alendronate sodium], Propoxyphene hcl, Sulfamethoxazole-trimethoprim, Tape, Xarelto [rivaroxaban], Amoxicillin, Other, and Penicillins   Review of Systems Review of Systems Per HPI  Physical Exam Triage Vital Signs ED Triage  Vitals  Enc Vitals Group     BP 08/11/21 1010 (!) 161/83     Pulse Rate 08/11/21 1010 66     Resp 08/11/21 1010 16     Temp 08/11/21 1010 98 F (36.7 C)     Temp Source 08/11/21 1010 Oral     SpO2 08/11/21 1010 94 %     Weight --      Height --      Head Circumference --      Peak Flow --      Pain Score 08/11/21 1011 8     Pain Loc --      Pain Edu? --      Excl. in [place]? --    No data found.  Updated Vital Signs BP (!) 161/83 (BP Location: Right Arm)    Pulse 66    Temp 98 F (36.7 C) (Oral)    Resp 16    LMP 08/26/1969    SpO2 94%   Visual Acuity Right Eye Distance:   Left Eye Distance:   Bilateral Distance:    Right Eye Near:   Left Eye Near:    Bilateral Near:     Physical Exam Constitutional:      General: She is not in acute distress.    Appearance: Normal appearance. She is not toxic-appearing or diaphoretic.  HENT:     Head: Normocephalic and atraumatic.     Right Ear: Tympanic membrane and ear canal normal.     Left Ear: Tympanic membrane and ear canal normal.     Nose: Congestion present.     Right Sinus: Maxillary sinus tenderness present. No frontal sinus tenderness.     Left Sinus: Maxillary sinus tenderness present. No frontal sinus tenderness.     Mouth/Throat:     Mouth: Mucous membranes are moist.     Pharynx: No posterior oropharyngeal erythema.  Eyes:     Extraocular Movements: Extraocular movements intact.  Conjunctiva/sclera: Conjunctivae normal.     Pupils: Pupils are equal, round, and reactive to light.  Cardiovascular:     Rate and Rhythm: Normal rate and regular rhythm.     Pulses: Normal pulses.     Heart sounds: Normal heart sounds.  Pulmonary:     Effort: Pulmonary effort is normal. No respiratory distress.     Breath sounds: Normal breath sounds. No stridor. No wheezing, rhonchi or rales.  Abdominal:     General: Abdomen is flat. Bowel sounds are normal.     Palpations: Abdomen is soft.  Musculoskeletal:        General:  Normal range of motion.     Cervical back: Normal range of motion.  Skin:    General: Skin is warm and dry.  Neurological:     General: No focal deficit present.     Mental Status: She is alert and oriented to person, place, and time. Mental status is at baseline.  Psychiatric:        Mood and Affect: Mood normal.        Behavior: Behavior normal.     UC Treatments / Results  Labs (all labs ordered are listed, but only abnormal results are displayed) Labs Reviewed  COVID-19, FLU A+B NAA    EKG   Radiology DG Chest 2 View  Result Date: 08/11/2021 CLINICAL DATA:  Cough EXAM: CHEST - 2 VIEW COMPARISON:  10/02/2018 FINDINGS: Longstanding nodular opacity in the left upper lobe. Pleural thickening with mild volume loss and subpleural reticulation on the left. Normal heart size and mediastinal contours. Advanced thoracic disc degeneration. IMPRESSION: 1. No acute finding when compared to prior. 2. Pleuroparenchymal scarring on the left. Electronically Signed   By: Jorje Guild M.D.   On: 08/11/2021 10:38    Procedures Procedures (including critical care time)  Medications Ordered in UC Medications - No data to display  Initial Impression / Assessment and Plan / UC Course  I have reviewed the triage vital signs and the nursing notes.  Pertinent labs & imaging results that were available during my care of the patient were reviewed by me and considered in my medical decision making (see chart for details).     Patient presents with symptoms likely from a viral upper respiratory infection. Differential includes bacterial pneumonia, sinusitis, allergic rhinitis, COVID-19, flu. Do not suspect underlying cardiopulmonary process. Symptoms seem unlikely related to ACS, CHF or COPD exacerbations, pneumonia, pneumothorax. Patient is nontoxic appearing and not in need of emergent medical intervention.  Chest x-ray was negative for any acute cardiopulmonary process.  It did show chronic  changes that are unchanged from previous x-ray.  Discussed this with patient and follow-up with PCP.  Recommended symptom control with over the counter medications.  Patient offered prescriptions.  Return if symptoms fail to improve. Patient states understanding and is agreeable.  Discharged with PCP followup.  Final Clinical Impressions(s) / UC Diagnoses   Final diagnoses:  Viral upper respiratory tract infection with cough     Discharge Instructions      It appears that you have a viral upper respiratory infection that should resolve the next few days with symptomatic treatment.  You have been prescribed 2 medications to help alleviate your symptoms.  Please follow-up if symptoms persist or worsen.  COVID-19 and flu test is pending.  We will call if it is positive.     ED Prescriptions     Medication Sig Dispense Auth. Provider   benzonatate (TESSALON) 100 MG  capsule Take 1 capsule (100 mg total) by mouth every 8 (eight) hours as needed for cough. 21 capsule [place], [place], [place]   fluticasone [facility]) 50 MCG/ACT nasal spray Place 1 spray into both nostrils daily for 3 days. 16 g Teodora Medici, [place]      PDMP not reviewed this encounter.   Teodora Medici, [place] 08/11/21 1105

## 2021-08-11 NOTE — Discharge Instructions (Signed)
It appears that you have a viral upper respiratory infection that should resolve the next few days with symptomatic treatment.  You have been prescribed 2 medications to help alleviate your symptoms.  Please follow-up if symptoms persist or worsen.  COVID-19 and flu test is pending.  We will call if it is positive.

## 2021-08-13 DIAGNOSIS — Z23 Encounter for immunization: Secondary | ICD-10-CM | POA: Diagnosis not present

## 2021-08-13 LAB — COVID-19, FLU A+B NAA
Influenza A, NAA: NOT DETECTED
Influenza B, NAA: NOT DETECTED
SARS-CoV-2, NAA: NOT DETECTED

## 2021-08-14 ENCOUNTER — Ambulatory Visit (INDEPENDENT_AMBULATORY_CARE_PROVIDER_SITE_OTHER): Payer: Medicare Other | Admitting: Family Medicine

## 2021-08-14 ENCOUNTER — Ambulatory Visit: Payer: Medicare Other | Admitting: Physical Therapy

## 2021-08-14 ENCOUNTER — Other Ambulatory Visit: Payer: Self-pay

## 2021-08-14 ENCOUNTER — Encounter: Payer: Self-pay | Admitting: Family Medicine

## 2021-08-14 ENCOUNTER — Encounter: Payer: Self-pay | Admitting: Physical Therapy

## 2021-08-14 DIAGNOSIS — R262 Difficulty in walking, not elsewhere classified: Secondary | ICD-10-CM | POA: Diagnosis not present

## 2021-08-14 DIAGNOSIS — M542 Cervicalgia: Secondary | ICD-10-CM | POA: Diagnosis not present

## 2021-08-14 DIAGNOSIS — M545 Low back pain, unspecified: Secondary | ICD-10-CM | POA: Diagnosis not present

## 2021-08-14 DIAGNOSIS — M62830 Muscle spasm of back: Secondary | ICD-10-CM

## 2021-08-14 DIAGNOSIS — J0100 Acute maxillary sinusitis, unspecified: Secondary | ICD-10-CM

## 2021-08-14 DIAGNOSIS — J019 Acute sinusitis, unspecified: Secondary | ICD-10-CM | POA: Insufficient documentation

## 2021-08-14 MED ORDER — AZITHROMYCIN 250 MG PO TABS: ORAL_TABLET | ORAL | 0 refills | Status: DC

## 2021-08-14 NOTE — Patient Instructions (Addendum)
Saline nasal spray is ok   Take the zpak as directed for sinus infection  Continue current symptom treatment   If you suddenly get worse or short of breath go to the ER   Update if not starting to improve in a week or if worsening

## 2021-08-14 NOTE — Assessment & Plan Note (Signed)
S/p recent uri  Sinus pain and pressure with purulent nasal mucous and cough  Reviewed recent UC visit and lab and cxr   Px augmentin  Discussed symptomatic care  Fluids/rest and flonase Update if not starting to improve in a week or if worsening

## 2021-08-14 NOTE — Progress Notes (Signed)
Subjective:    Patient ID: Linda Mcgrath, female    DOB: 26-Feb-1933, 87 y.o.   MRN: [id number]  This visit occurred during the SARS-CoV-2 public health emergency.  Safety protocols were in place, including screening questions prior to the visit, additional usage of staff PPE, and extensive cleaning of exam room while observing appropriate contact time as indicated for disinfecting solutions.   HPI Pt presents with nasal congestion and eye discomfort   Wt Readings from Last 3 Encounters:  08/14/21 168 lb 2 oz (76.3 kg)  05/02/21 175 lb (79.4 kg)  03/16/21 175 lb (79.4 kg)   29.78 kg/m   She was seen in UC on 12/17 for uri/prod cough  Cxr was neg  Covid and flu tests negative  Now a lot of facial pain  Eyes are gunky -swollen  A little sore throat  Sore in the lymph nodes area  Coughing - mucous green No sob  No wheezing  No fever   Fully immunized   Otc: Honey cough med Has some mucinex DM  Cough drops  Tessalon  Nasal spray -flonase   Had surgery for retinal tear Has to try and avoid coughing    Results for orders placed or performed during the hospital encounter of 08/11/21  Covid-19, Flu A+B ([facility])   Specimen: Nasopharyngeal   Naso  Result Value Ref Range   SARS-CoV-2, NAA Not Detected Not Detected   Influenza A, NAA Not Detected Not Detected   Influenza B, NAA Not Detected Not Detected   Test Information: Comment      DG Chest 2 View  Result Date: 08/11/2021 CLINICAL DATA:  Cough EXAM: CHEST - 2 VIEW COMPARISON:  10/02/2018 FINDINGS: Longstanding nodular opacity in the left upper lobe. Pleural thickening with mild volume loss and subpleural reticulation on the left. Normal heart size and mediastinal contours. Advanced thoracic disc degeneration. IMPRESSION: 1. No acute finding when compared to prior. 2. Pleuroparenchymal scarring on the left. Electronically Signed   By: Tiburcio Pea M.D.   On: 08/11/2021 10:38    Tx with tessalon and  flonase   She did take her covid booster test yesterday   Patient Active Problem List   Diagnosis Date Noted   Acute sinusitis 08/14/2021   Chronic upper back pain 08/30/2020   Aortic atherosclerosis (HCC) 05/31/2020   Abnormal CT scan, small bowel 05/18/2020   Generalized abdominal pain 05/17/2020   Duodenitis 03/13/2020   Right lateral abdominal pain 03/07/2020   Insomnia 12/15/2019   Chronic low back pain 10/18/2019   Positive depression screening 10/18/2019   Osteoarthritis of right knee 08/02/2019   Diarrhea 07/03/2018   Dyspepsia 07/03/2018   S/P lumbar spinal fusion 07/31/2017   Right low back pain 03/04/2017   H/O atrial flutter 03/06/2016   Obesity 11/06/2015   Urge incontinence 01/24/2015   Estrogen deficiency 10/25/2014   Lichen planus 12/20/2013   Encounter for Medicare annual wellness exam 06/17/2013   Screening mammogram, encounter for 06/15/2012   Prediabetes 06/03/2011   HYPERTENSION, BENIGN ESSENTIAL 10/23/2010   OSTEOARTHRITIS, HANDS, BILATERAL 01/24/2010   Hypothyroidism 08/24/2008   Vitamin D deficiency 06/06/2008   Hyperlipidemia 06/06/2008   MENOPAUSAL SYNDROME 03/29/2008   Osteopenia 03/29/2008   Allergic rhinitis 01/25/2008   IBS 01/25/2008   OSTEOARTHRITIS 01/25/2008   FIBROMYALGIA 01/25/2008   Past Medical History:  Diagnosis Date   Allergic rhinitis    Arthritis    Asthma    Cataract    Bil/lens implant   Colon  polyp 1999   small polyp   Diverticulosis    Dysrhythmia    Fibromyalgia    Hyperlipidemia    Hypothyroidism    Internal hemorrhoids    Leg cramps    Lichen planus    Lung nodule    stable LUL 9 mm   Menopausal syndrome    Osteoarthritis    UTI (urinary tract infection)    Past Surgical History:  Procedure Laterality Date   Abd U/S  11/1998   negative   Abd U/S  01/2001   gallbladder polyps   ABDOMINAL HYSTERECTOMY  1975   total-fibroids   APPENDECTOMY  1975   BREAST BIOPSY     benign   BREAST EXCISIONAL  BIOPSY Left 1957   CARDIOVERSION N/A 03/07/2016   Procedure: CARDIOVERSION;  Surgeon: Yates Decamp, MD;  Location: [facility] ENDOSCOPY;  Service: Cardiovascular;  Laterality: N/A;   CATARACT EXTRACTION     bilateral   CHOLECYSTECTOMY     COLONOSCOPY  1998   Diverticulosis; polyp\   COLONOSCOPY  09/2002   Diverticulosis, hem   COLONOSCOPY  12/2007   diverticulosis, polyp   DEXA  10/2001   osteopenia   ELECTROPHYSIOLOGIC STUDY N/A 04/03/2016   Procedure: A-Flutter Ablation;  Surgeon: Will [name], MD;  Location: [facility] INVASIVE CV LAB;  Service: Cardiovascular;  Laterality: N/A;   ESOPHAGOGASTRODUODENOSCOPY  2004   [place]ida scan  01/2001   Negative   KNEE SURGERY     right knee / 11/2004   LAMINECTOMY WITH POSTERIOR LATERAL ARTHRODESIS LEVEL 2 N/A 07/31/2017   Procedure: DECOMPRESSIVE LAMINECTOMY LUMABR FOUR-FIVE, LUMBAR FIVE-SACRAL ONE;  Surgeon: Tia Alert, MD;  Location: [facility] OR;  Service: Neurosurgery;  Laterality: N/A;   laser surgery for glaucoma Left 09/21/2014   MULTIPLE TOOTH EXTRACTIONS     rentinal tear     ROTATOR CUFF REPAIR     x 2 /right shoulder   SKIN CANCER EXCISION     pre-melanoma / on face   TEE WITHOUT CARDIOVERSION N/A 03/07/2016   Procedure: TRANSESOPHAGEAL ECHOCARDIOGRAM (TEE);  Surgeon: Yates Decamp, MD;  Location: [facility] ENDOSCOPY;  Service: Cardiovascular;  Laterality: N/A;   TOE SURGERY     rt foot    Social History   Tobacco Use   Smoking status: Never   Smokeless tobacco: Never  Vaping Use   Vaping Use: Never used  Substance Use Topics   Alcohol use: No    Alcohol/week: 0.0 standard drinks    Comment: occasional-rare   Drug use: No   Family History  Problem Relation Age of Onset   Parkinsonism Father    Colon cancer Brother    Allergies Mother    Heart disease Mother    Kidney failure Son        congenital  (kidney transplant)    Heart failure Son        died suddenly of CHF    Allergies  Allergen Reactions   Shellfish Allergy Anaphylaxis and  Nausea And Vomiting   Tetracycline Hives, Nausea And Vomiting, Rash and Other (See Comments)    SYSTEMIC REACTION: heart palpitations,muscle spasms, vomiting   Amlodipine Besylate Other (See Comments)    REACTION: muscle spasms, extremity swelling, low bp   Ciprofloxacin Other (See Comments)    REACTION: muscle spasms, insomnia   Codeine Other (See Comments)    REACTION: hallucinations   Fosamax [Alendronate Sodium]    Propoxyphene Hcl Other (See Comments)    Unknown   Sulfamethoxazole-Trimethoprim Nausea Only and Other (See  Comments)    REACTION: dizzy, could not focus eyes and could not concentrate and nausea   Tape Other (See Comments)    BANDAIDS TAKE OFF HER SKIN; PLEASE USE COBAN OR PAPER    Xarelto [Rivaroxaban] Other (See Comments)    Aches and pains and nervousness   Amoxicillin Rash   Other Swelling, Rash and Other (See Comments)    Has autoimmune disorder and spices erode her salivary glands and affects ankles and mouth DIRECTLY (takes off 1st layer of skin and leaves her unable to walk)   Penicillins Swelling, Rash and Other (See Comments)    Has patient had a PCN reaction causing immediate rash, facial/tongue/throat swelling, SOB or lightheadedness with hypotension: Yes Has patient had a PCN reaction causing severe rash involving mucus membranes or skin necrosis: No Has patient had a PCN reaction that required hospitalization No Has patient had a PCN reaction occurring within the last 10 years: No If all of the above answers are "NO", then may proceed with Cephalosporin use.    Current Outpatient Medications on File Prior to Visit  Medication Sig Dispense Refill   Ascorbic Acid (VITAMIN C PO) Take 500 mg by mouth daily.     B Complex Vitamins (VITAMIN B COMPLEX PO) Take 1 tablet by mouth daily.     B COMPLEX, FOLIC ACID, PO Take 666 mcg by mouth daily.     benzonatate (TESSALON) 100 MG capsule Take 1 capsule (100 mg total) by mouth every 8 (eight) hours as needed for  cough. 21 capsule 0   Biotin 10 MG TABS Take 1 tablet by mouth every morning.      Calcium Citrate-Vitamin D 250-200 MG-UNIT TABS Take 1 tablet by mouth daily.     cholestyramine (QUESTRAN) 4 g packet TAKE 1 PACKET DAILY 90 each 0   Cranberry 180 MG CAPS Take 2 capsules by mouth daily.     dextromethorphan-guaiFENesin (MUCINEX DM) 30-600 MG 12hr tablet Take 1 tablet by mouth 2 (two) times daily. 15 tablet 0   famotidine (PEPCID) 20 MG tablet Take 1 tablet (20 mg total) by mouth 2 (two) times daily. 60 tablet 3   Fexofenadine HCl (MUCINEX ALLERGY PO) Take 1 tablet by mouth daily as needed (Allergies).      fluticasone (FLONASE) 50 MCG/ACT nasal spray Place 1 spray into both nostrils daily for 3 days. 16 g 0   folic acid (FOLVITE) 1 MG tablet Take 1 mg by mouth daily.     levothyroxine (SYNTHROID) 88 MCG tablet Take 1 tablet (88 mcg total) by mouth daily with breakfast. 90 tablet 3   loratadine (CLARITIN) 10 MG tablet Take 10 mg by mouth daily as needed for allergies.      MAGNESIUM-OXIDE PO Take 1 tablet by mouth daily.     Methenamine-Sodium Salicylate (AZO URINARY TRACT DEFENSE PO) Take 1 capsule by mouth daily.     methocarbamol (ROBAXIN) 500 MG tablet TAKE 1 TABLET (500 MG TOTAL) BY MOUTH EVERY 8 (EIGHT) HOURS AS NEEDED FOR MUSCLE SPASMS. CAUTION OF SEDATION 30 tablet 1   Multiple Vitamin (MULTIVITAMIN) capsule Take 1 capsule by mouth daily.     mupirocin ointment (BACTROBAN) 2 % Apply 1 application topically 2 (two) times daily. 30 g 0   Psyllium (METAMUCIL FIBER PO) Take by mouth at bedtime. 2 tablespoon     Pumpkin Seed-Soy Germ (AZO BLADDER CONTROL/GO-LESS PO) Take 1 tablet by mouth 2 (two) times a week.     solifenacin (VESICARE) 5 MG tablet  Take 1 tablet (5 mg total) by mouth daily. 30 tablet 11   TURMERIC PO Take 1 capsule by mouth 2 (two) times daily.      vitamin B-12 (CYANOCOBALAMIN) 500 MCG tablet Take 500 mcg by mouth daily.     Vitamin D, Cholecalciferol, 50 MCG (2000 UT) CAPS  Take 2 tablets by mouth daily.     No current facility-administered medications on file prior to visit.    Review of Systems  Constitutional:  Positive for fatigue. Negative for activity change, appetite change, fever and unexpected weight change.  HENT:  Positive for congestion, postnasal drip, rhinorrhea, sinus pressure and sinus pain. Negative for ear pain and sore throat.   Eyes:  Negative for pain, redness and visual disturbance.  Respiratory:  Positive for cough. Negative for shortness of breath, wheezing and stridor.   Cardiovascular:  Negative for chest pain and palpitations.  Gastrointestinal:  Negative for abdominal pain, blood in stool, constipation and diarrhea.  Endocrine: Negative for polydipsia and polyuria.  Genitourinary:  Negative for dysuria, frequency and urgency.  Musculoskeletal:  Negative for arthralgias, back pain and myalgias.  Skin:  Negative for pallor and rash.  Allergic/Immunologic: Negative for environmental allergies.  Neurological:  Positive for headaches. Negative for dizziness and syncope.  Hematological:  Negative for adenopathy. Does not bruise/bleed easily.  Psychiatric/Behavioral:  Negative for decreased concentration and dysphoric mood. The patient is not nervous/anxious.       Objective:   Physical Exam Constitutional:      General: She is not in acute distress.    Appearance: Normal appearance. She is well-developed. She is obese. She is not ill-appearing or diaphoretic.  HENT:     Head: Normocephalic and atraumatic.     Comments: Nares are injected and congested  Clear pnd   Bilateral maxillary sinus tenderness     Right Ear: Tympanic membrane and external ear normal.     Left Ear: Tympanic membrane, ear canal and external ear normal.     Nose: Congestion and rhinorrhea present.     Mouth/Throat:     Mouth: Mucous membranes are moist.     Pharynx: No oropharyngeal exudate or posterior oropharyngeal erythema.     Comments: Clear  pnd Eyes:     General:        Right eye: No discharge.        Left eye: No discharge.     Conjunctiva/sclera: Conjunctivae normal.     Pupils: Pupils are equal, round, and reactive to light.  Cardiovascular:     Rate and Rhythm: Normal rate and regular rhythm.  Pulmonary:     Effort: Pulmonary effort is normal. No respiratory distress.     Breath sounds: Normal breath sounds. No stridor. No wheezing, rhonchi or rales.     Comments: Good air exch Musculoskeletal:     Cervical back: Normal range of motion and neck supple.  Lymphadenopathy:     Cervical: No cervical adenopathy.  Skin:    General: Skin is warm and dry.     Findings: No rash.  Neurological:     Mental Status: She is alert.     Cranial Nerves: No cranial nerve deficit.  Psychiatric:        Mood and Affect: Mood normal.          Assessment & Plan:   Problem List Items Addressed This Visit       Respiratory   Acute sinusitis    S/p recent uri  Sinus pain and  pressure with purulent nasal mucous and cough  Reviewed recent UC visit and lab and cxr   Px augmentin  Discussed symptomatic care  Fluids/rest and flonase Update if not starting to improve in a week or if worsening        Relevant Medications   azithromycin (ZITHROMAX Z-PAK) 250 MG tablet

## 2021-08-14 NOTE — Therapy (Signed)
[place]. [place], [place], [address] Phone: [phone]   Fax:  [phone]  Physical Therapy Treatment  Patient Details  Name: Linda Mcgrath MRN: [id number] Date of Birth: 06-05-1933 Referring Provider (PT): [name]   Encounter Date: 08/14/2021   PT End of Session - 08/14/21 1330     Visit Number 10    Date for PT Re-Evaluation 08/16/21    Authorization Type Medicare    PT Start Time 1252    PT Stop Time 1345    PT Time Calculation (min) 53 min    Activity Tolerance Patient tolerated treatment well    Behavior During Therapy [facility] for tasks assessed/performed             Past Medical History:  Diagnosis Date   Allergic rhinitis    Arthritis    Asthma    Cataract    Bil/lens implant   Colon polyp 1999   small polyp   Diverticulosis    Dysrhythmia    Fibromyalgia    Hyperlipidemia    Hypothyroidism    Internal hemorrhoids    Leg cramps    Lichen planus    Lung nodule    stable LUL 9 mm   Menopausal syndrome    Osteoarthritis    UTI (urinary tract infection)     Past Surgical History:  Procedure Laterality Date   Abd U/S  11/1998   negative   Abd U/S  01/2001   gallbladder polyps   ABDOMINAL HYSTERECTOMY  1975   total-fibroids   APPENDECTOMY  1975   BREAST BIOPSY     benign   BREAST EXCISIONAL BIOPSY Left 1957   CARDIOVERSION N/A 03/07/2016   Procedure: CARDIOVERSION;  Surgeon: Adrian Prows, MD;  Location: [place];  Service: Cardiovascular;  Laterality: N/A;   CATARACT EXTRACTION     bilateral   CHOLECYSTECTOMY     COLONOSCOPY  1998   Diverticulosis; polyp\   COLONOSCOPY  09/2002   Diverticulosis, hem   COLONOSCOPY  12/2007   diverticulosis, polyp   DEXA  10/2001   osteopenia   ELECTROPHYSIOLOGIC STUDY N/A 04/03/2016   Procedure: A-Flutter Ablation;  Surgeon: Will Meredith Leeds, MD;  Location: [place] CV LAB;  Service: Cardiovascular;  Laterality: N/A;    ESOPHAGOGASTRODUODENOSCOPY  2004   Hida scan  01/2001   Negative   KNEE SURGERY     right knee / 11/2004   LAMINECTOMY WITH POSTERIOR LATERAL ARTHRODESIS LEVEL 2 N/A 07/31/2017   Procedure: DECOMPRESSIVE LAMINECTOMY LUMABR FOUR-FIVE, LUMBAR FIVE-SACRAL ONE;  Surgeon: Eustace Moore, MD;  Location: [place];  Service: Neurosurgery;  Laterality: N/A;   laser surgery for glaucoma Left Sep 21, 2014   MULTIPLE TOOTH EXTRACTIONS     ROTATOR CUFF REPAIR     x 2 /right shoulder   SKIN CANCER EXCISION     pre-melanoma / on face   TEE WITHOUT CARDIOVERSION N/A 03/07/2016   Procedure: TRANSESOPHAGEAL ECHOCARDIOGRAM (TEE);  Surgeon: Adrian Prows, MD;  Location: [place];  Service: Cardiovascular;  Laterality: N/A;   TOE SURGERY     rt foot     There were no vitals filed for this visit.   Subjective Assessment - 08/14/21 1250     Subjective Patient reports that her "son" passed away over the past week, she also reports a fall out of the bed and got wedged between the bed and the wall.  She reports incresaed right groin pain and thinks she  Chronic upper back pain 08/30/2020   Aortic  atherosclerosis ([place]) 05/31/2020   Abnormal CT scan, small bowel 05/18/2020   Generalized abdominal pain 05/17/2020   Duodenitis 03/13/2020   Right lateral abdominal pain 03/07/2020   Insomnia 12/15/2019   Chronic low back pain 10/18/2019   Positive depression screening 10/18/2019   Osteoarthritis of right knee 08/02/2019   Diarrhea 07/03/2018   Dyspepsia 07/03/2018   S/P lumbar spinal fusion 07/31/2017   Right low back pain 03/04/2017   H/O atrial flutter 03/06/2016   Obesity 11/06/2015   Urge incontinence 01/24/2015   Estrogen deficiency 85/92/9244   Lichen planus 62/86/3817   Encounter for Medicare annual wellness exam 06/17/2013   Screening mammogram, encounter for 06/15/2012   Prediabetes 06/03/2011   HYPERTENSION, BENIGN ESSENTIAL 10/23/2010   OSTEOARTHRITIS, HANDS, BILATERAL 01/24/2010   Hypothyroidism 08/24/2008   Vitamin D deficiency 06/06/2008   Hyperlipidemia 06/06/2008   MENOPAUSAL SYNDROME 03/29/2008   Osteopenia 03/29/2008   Allergic rhinitis 01/25/2008   IBS 01/25/2008   OSTEOARTHRITIS 01/25/2008   FIBROMYALGIA 01/25/2008    Sumner Boast, PT 08/14/2021, 1:36 PM  [place]. [place], [place], [address] Phone: [phone]   Fax:  [phone]  Name: Linda Mcgrath MRN: [id number] Date of Birth: February 01, 1933  Chronic upper back pain 08/30/2020   Aortic  atherosclerosis ([place]) 05/31/2020   Abnormal CT scan, small bowel 05/18/2020   Generalized abdominal pain 05/17/2020   Duodenitis 03/13/2020   Right lateral abdominal pain 03/07/2020   Insomnia 12/15/2019   Chronic low back pain 10/18/2019   Positive depression screening 10/18/2019   Osteoarthritis of right knee 08/02/2019   Diarrhea 07/03/2018   Dyspepsia 07/03/2018   S/P lumbar spinal fusion 07/31/2017   Right low back pain 03/04/2017   H/O atrial flutter 03/06/2016   Obesity 11/06/2015   Urge incontinence 01/24/2015   Estrogen deficiency 85/92/9244   Lichen planus 62/86/3817   Encounter for Medicare annual wellness exam 06/17/2013   Screening mammogram, encounter for 06/15/2012   Prediabetes 06/03/2011   HYPERTENSION, BENIGN ESSENTIAL 10/23/2010   OSTEOARTHRITIS, HANDS, BILATERAL 01/24/2010   Hypothyroidism 08/24/2008   Vitamin D deficiency 06/06/2008   Hyperlipidemia 06/06/2008   MENOPAUSAL SYNDROME 03/29/2008   Osteopenia 03/29/2008   Allergic rhinitis 01/25/2008   IBS 01/25/2008   OSTEOARTHRITIS 01/25/2008   FIBROMYALGIA 01/25/2008    Sumner Boast, PT 08/14/2021, 1:36 PM  [place]. [place], [place], [address] Phone: [phone]   Fax:  [phone]  Name: Linda Mcgrath MRN: [id number] Date of Birth: February 01, 1933

## 2021-08-21 ENCOUNTER — Ambulatory Visit: Payer: Medicare Other | Admitting: Physical Therapy

## 2021-08-21 ENCOUNTER — Other Ambulatory Visit: Payer: Self-pay

## 2021-08-21 ENCOUNTER — Encounter: Payer: Self-pay | Admitting: Physical Therapy

## 2021-08-21 DIAGNOSIS — M62830 Muscle spasm of back: Secondary | ICD-10-CM | POA: Diagnosis not present

## 2021-08-21 DIAGNOSIS — M542 Cervicalgia: Secondary | ICD-10-CM

## 2021-08-21 DIAGNOSIS — R262 Difficulty in walking, not elsewhere classified: Secondary | ICD-10-CM

## 2021-08-21 DIAGNOSIS — M545 Low back pain, unspecified: Secondary | ICD-10-CM

## 2021-08-21 NOTE — Therapy (Signed)
[place] [place], [place], [address] Phone: [phone]   Fax:  [phone]  Physical Therapy Treatment  Patient Details  Name: Linda Mcgrath MRN: [id number] Date of Birth: 1933-08-15 Referring Provider (PT): [name]   Encounter Date: 08/21/2021   PT End of Session - 08/21/21 1338     Visit Number 11    Date for PT Re-Evaluation 09/21/21    Authorization Type Medicare    PT Start Time 1300    PT Stop Time 1350    PT Time Calculation (min) 50 min    Activity Tolerance Patient tolerated treatment well    Behavior During Therapy [facility] for tasks assessed/performed             Past Medical History:  Diagnosis Date   Allergic rhinitis    Arthritis    Asthma    Cataract    Bil/lens implant   Colon polyp 1999   small polyp   Diverticulosis    Dysrhythmia    Fibromyalgia    Hyperlipidemia    Hypothyroidism    Internal hemorrhoids    Leg cramps    Lichen planus    Lung nodule    stable LUL 9 mm   Menopausal syndrome    Osteoarthritis    UTI (urinary tract infection)     Past Surgical History:  Procedure Laterality Date   Abd U/S  11/1998   negative   Abd U/S  01/2001   gallbladder polyps   ABDOMINAL HYSTERECTOMY  1975   total-fibroids   APPENDECTOMY  1975   BREAST BIOPSY     benign   BREAST EXCISIONAL BIOPSY Left 1957   CARDIOVERSION N/A 03/07/2016   Procedure: CARDIOVERSION;  Surgeon: Adrian Prows, MD;  Location: [place];  Service: Cardiovascular;  Laterality: N/A;   CATARACT EXTRACTION     bilateral   CHOLECYSTECTOMY     COLONOSCOPY  1998   Diverticulosis; polyp\   COLONOSCOPY  09/2002   Diverticulosis, hem   COLONOSCOPY  12/2007   diverticulosis, polyp   DEXA  10/2001   osteopenia   ELECTROPHYSIOLOGIC STUDY N/A 04/03/2016   Procedure: A-Flutter Ablation;  Surgeon: Will Meredith Leeds, MD;  Location: [place] [facility];  Service: Cardiovascular;  Laterality: N/A;    ESOPHAGOGASTRODUODENOSCOPY  2004   Hida scan  01/2001   Negative   KNEE SURGERY     right knee / 11/2004   LAMINECTOMY WITH POSTERIOR LATERAL ARTHRODESIS LEVEL 2 N/A 07/31/2017   Procedure: DECOMPRESSIVE LAMINECTOMY LUMABR FOUR-FIVE, LUMBAR FIVE-SACRAL ONE;  Surgeon: Eustace Moore, MD;  Location: [place];  Service: Neurosurgery;  Laterality: N/A;   laser surgery for glaucoma Left 09/21/2014   MULTIPLE TOOTH EXTRACTIONS     rentinal tear     ROTATOR CUFF REPAIR     x 2 /right shoulder   SKIN CANCER EXCISION     pre-melanoma / on face   TEE WITHOUT CARDIOVERSION N/A 03/07/2016   Procedure: TRANSESOPHAGEAL ECHOCARDIOGRAM (TEE);  Surgeon: Adrian Prows, MD;  Location: [place];  Service: Cardiovascular;  Laterality: N/A;   TOE SURGERY     rt foot     There were no vitals filed for this visit.   Subjective Assessment - 08/21/21 1334     Subjective Patient continues to struggle, her sinus infection was very bad last week, she is planning the funeral that will take place tomorrow, she is not walking well today, she is slow moving and appears to  Management;Cryotherapy;Electrical Stimulation;Iontophoresis 85m/ml Dexamethasone;Moist Heat;Traction;Gait training;Neuromuscular re-education;Balance training;Therapeutic exercise;Therapeutic activities;Functional mobility  training;Stair training;Patient/family education;Manual techniques;Dry needling    PT Next Visit Plan try to get her feeling better    Consulted and Agree with Plan of Care Patient             Patient will benefit from skilled therapeutic intervention in order to improve the following deficits and impairments:  Abnormal gait, Decreased range of motion, Difficulty walking, Increased muscle spasms, Pain, Decreased activity tolerance, Decreased balance, Impaired flexibility, Improper body mechanics, Decreased mobility, Decreased strength, Postural dysfunction  Visit Diagnosis: Acute bilateral low back pain without sciatica - Plan: PT plan of care cert/re-cert  Difficulty in walking, not elsewhere classified - Plan: PT plan of care cert/re-cert  Muscle spasm of back - Plan: PT plan of care cert/re-cert  Cervicalgia - Plan: PT plan of care cert/re-cert     Problem List Patient Active Problem List   Diagnosis Date Noted   Acute sinusitis 08/14/2021   Chronic upper back pain 08/30/2020   Aortic atherosclerosis [place] 05/31/2020   Abnormal CT scan, small bowel 05/18/2020   Generalized abdominal pain 05/17/2020   Duodenitis 03/13/2020   Right lateral abdominal pain 03/07/2020   Insomnia 12/15/2019   Chronic low back pain 10/18/2019   Positive depression screening 10/18/2019   Osteoarthritis of right knee 08/02/2019   Diarrhea 07/03/2018   Dyspepsia 07/03/2018   S/P lumbar spinal fusion 07/31/2017   Right low back pain 03/04/2017   H/O atrial flutter 03/06/2016   Obesity 11/06/2015   Urge incontinence 01/24/2015   Estrogen deficiency 070/14/1030  Lichen planus 013/14/3888  Encounter for Medicare annual wellness exam 06/17/2013   Screening mammogram, encounter for 06/15/2012   Prediabetes 06/03/2011   HYPERTENSION, BENIGN ESSENTIAL 10/23/2010   OSTEOARTHRITIS, HANDS, BILATERAL 01/24/2010   Hypothyroidism 08/24/2008   Vitamin D deficiency 06/06/2008   Hyperlipidemia  06/06/2008   MENOPAUSAL SYNDROME 03/29/2008   Osteopenia 03/29/2008   Allergic rhinitis 01/25/2008   IBS 01/25/2008   OSTEOARTHRITIS 01/25/2008   FIBROMYALGIA 01/25/2008    ASumner Boast PT 08/21/2021, 1:43 PM  [place] [address]Phone: [phone]  Fax:  [phone] Name: SMAKYAH LAVIGNEMRN: [id number] of Birth: 806-13-1934  [place] [place], [place], [address] Phone: [phone]   Fax:  [phone]  Physical Therapy Treatment  Patient Details  Name: Linda Mcgrath MRN: [id number] Date of Birth: 1933-08-15 Referring Provider (PT): [name]   Encounter Date: 08/21/2021   PT End of Session - 08/21/21 1338     Visit Number 11    Date for PT Re-Evaluation 09/21/21    Authorization Type Medicare    PT Start Time 1300    PT Stop Time 1350    PT Time Calculation (min) 50 min    Activity Tolerance Patient tolerated treatment well    Behavior During Therapy [facility] for tasks assessed/performed             Past Medical History:  Diagnosis Date   Allergic rhinitis    Arthritis    Asthma    Cataract    Bil/lens implant   Colon polyp 1999   small polyp   Diverticulosis    Dysrhythmia    Fibromyalgia    Hyperlipidemia    Hypothyroidism    Internal hemorrhoids    Leg cramps    Lichen planus    Lung nodule    stable LUL 9 mm   Menopausal syndrome    Osteoarthritis    UTI (urinary tract infection)     Past Surgical History:  Procedure Laterality Date   Abd U/S  11/1998   negative   Abd U/S  01/2001   gallbladder polyps   ABDOMINAL HYSTERECTOMY  1975   total-fibroids   APPENDECTOMY  1975   BREAST BIOPSY     benign   BREAST EXCISIONAL BIOPSY Left 1957   CARDIOVERSION N/A 03/07/2016   Procedure: CARDIOVERSION;  Surgeon: Adrian Prows, MD;  Location: [place];  Service: Cardiovascular;  Laterality: N/A;   CATARACT EXTRACTION     bilateral   CHOLECYSTECTOMY     COLONOSCOPY  1998   Diverticulosis; polyp\   COLONOSCOPY  09/2002   Diverticulosis, hem   COLONOSCOPY  12/2007   diverticulosis, polyp   DEXA  10/2001   osteopenia   ELECTROPHYSIOLOGIC STUDY N/A 04/03/2016   Procedure: A-Flutter Ablation;  Surgeon: Will Meredith Leeds, MD;  Location: [place] [facility];  Service: Cardiovascular;  Laterality: N/A;    ESOPHAGOGASTRODUODENOSCOPY  2004   Hida scan  01/2001   Negative   KNEE SURGERY     right knee / 11/2004   LAMINECTOMY WITH POSTERIOR LATERAL ARTHRODESIS LEVEL 2 N/A 07/31/2017   Procedure: DECOMPRESSIVE LAMINECTOMY LUMABR FOUR-FIVE, LUMBAR FIVE-SACRAL ONE;  Surgeon: Eustace Moore, MD;  Location: [place];  Service: Neurosurgery;  Laterality: N/A;   laser surgery for glaucoma Left 09/21/2014   MULTIPLE TOOTH EXTRACTIONS     rentinal tear     ROTATOR CUFF REPAIR     x 2 /right shoulder   SKIN CANCER EXCISION     pre-melanoma / on face   TEE WITHOUT CARDIOVERSION N/A 03/07/2016   Procedure: TRANSESOPHAGEAL ECHOCARDIOGRAM (TEE);  Surgeon: Adrian Prows, MD;  Location: [place];  Service: Cardiovascular;  Laterality: N/A;   TOE SURGERY     rt foot     There were no vitals filed for this visit.   Subjective Assessment - 08/21/21 1334     Subjective Patient continues to struggle, her sinus infection was very bad last week, she is planning the funeral that will take place tomorrow, she is not walking well today, she is slow moving and appears to

## 2021-08-28 ENCOUNTER — Ambulatory Visit: Payer: Medicare Other | Attending: Family Medicine | Admitting: Physical Therapy

## 2021-08-28 ENCOUNTER — Other Ambulatory Visit: Payer: Self-pay

## 2021-08-28 ENCOUNTER — Encounter: Payer: Self-pay | Admitting: Physical Therapy

## 2021-08-28 DIAGNOSIS — M542 Cervicalgia: Secondary | ICD-10-CM

## 2021-08-28 DIAGNOSIS — G8929 Other chronic pain: Secondary | ICD-10-CM | POA: Insufficient documentation

## 2021-08-28 DIAGNOSIS — R262 Difficulty in walking, not elsewhere classified: Secondary | ICD-10-CM | POA: Diagnosis not present

## 2021-08-28 DIAGNOSIS — M62830 Muscle spasm of back: Secondary | ICD-10-CM

## 2021-08-28 DIAGNOSIS — M25561 Pain in right knee: Secondary | ICD-10-CM | POA: Diagnosis not present

## 2021-08-28 DIAGNOSIS — M545 Low back pain, unspecified: Secondary | ICD-10-CM | POA: Diagnosis not present

## 2021-08-28 NOTE — Therapy (Signed)
08/02/2019    Diarrhea 07/03/2018   Dyspepsia 07/03/2018   S/P lumbar spinal fusion 07/31/2017   Right low back pain 03/04/2017   H/O atrial flutter 03/06/2016   Obesity 11/06/2015   Urge incontinence 01/24/2015   Estrogen deficiency 59/47/0761   Lichen planus 51/83/4373   Encounter for Medicare annual wellness exam 06/17/2013   Screening mammogram, encounter for 06/15/2012   Prediabetes 06/03/2011   HYPERTENSION, BENIGN ESSENTIAL 10/23/2010   OSTEOARTHRITIS, HANDS, BILATERAL 01/24/2010   Hypothyroidism 08/24/2008   Vitamin D deficiency 06/06/2008   Hyperlipidemia 06/06/2008   MENOPAUSAL SYNDROME 03/29/2008   Osteopenia 03/29/2008   Allergic rhinitis 01/25/2008   IBS 01/25/2008   OSTEOARTHRITIS 01/25/2008   FIBROMYALGIA 01/25/2008    Sumner Boast, PT 08/28/2021, 1:28 PM  [place]. [place], [place], [address] Phone: [phone]   Fax:  [phone]  Name: Linda Mcgrath MRN: [id number] Date of Birth: 25-Jun-1933  [place]. [place], [place], [address] Phone: [phone]   Fax:  [phone]  Physical Therapy Treatment  Patient Details  Name: Linda Mcgrath MRN: [id number] Date of Birth: 06/23/1933 Referring Provider (PT): [name]   Encounter Date: 08/28/2021   PT End of Session - 08/28/21 1325     Visit Number 12    Date for PT Re-Evaluation 09/21/21    Authorization Type Medicare    PT Start Time 1255    PT Stop Time 1345    PT Time Calculation (min) 50 min    Activity Tolerance Patient tolerated treatment well    Behavior During Therapy [facility] for tasks assessed/performed             Past Medical History:  Diagnosis Date   Allergic rhinitis    Arthritis    Asthma    Cataract    Bil/lens implant   Colon polyp 1999   small polyp   Diverticulosis    Dysrhythmia    Fibromyalgia    Hyperlipidemia    Hypothyroidism    Internal hemorrhoids    Leg cramps    Lichen planus    Lung nodule    stable LUL 9 mm   Menopausal syndrome    Osteoarthritis    UTI (urinary tract infection)     Past Surgical History:  Procedure Laterality Date   Abd U/S  11/1998   negative   Abd U/S  01/2001   gallbladder polyps   ABDOMINAL HYSTERECTOMY  1975   total-fibroids   APPENDECTOMY  1975   BREAST BIOPSY     benign   BREAST EXCISIONAL BIOPSY Left 1957   CARDIOVERSION N/A 03/07/2016   Procedure: CARDIOVERSION;  Surgeon: Adrian Prows, MD;  Location: [place];  Service: Cardiovascular;  Laterality: N/A;   CATARACT EXTRACTION     bilateral   CHOLECYSTECTOMY     COLONOSCOPY  1998   Diverticulosis; polyp\   COLONOSCOPY  09/2002   Diverticulosis, hem   COLONOSCOPY  12/2007   diverticulosis, polyp   DEXA  10/2001   osteopenia   ELECTROPHYSIOLOGIC STUDY N/A 04/03/2016   Procedure: A-Flutter Ablation;  Surgeon: Will Meredith Leeds, MD;  Location: [place] [facility];  Service: Cardiovascular;  Laterality: N/A;    ESOPHAGOGASTRODUODENOSCOPY  2004   Hida scan  01/2001   Negative   KNEE SURGERY     right knee / 11/2004   LAMINECTOMY WITH POSTERIOR LATERAL ARTHRODESIS LEVEL 2 N/A 07/31/2017   Procedure: DECOMPRESSIVE LAMINECTOMY LUMABR FOUR-FIVE, LUMBAR FIVE-SACRAL ONE;  Surgeon: Eustace Moore, MD;  Location: [place];  Service: Neurosurgery;  Laterality: N/A;   laser surgery for glaucoma Left 09/21/2014   MULTIPLE TOOTH EXTRACTIONS     rentinal tear     ROTATOR CUFF REPAIR     x 2 /right shoulder   SKIN CANCER EXCISION     pre-melanoma / on face   TEE WITHOUT CARDIOVERSION N/A 03/07/2016   Procedure: TRANSESOPHAGEAL ECHOCARDIOGRAM (TEE);  Surgeon: Adrian Prows, MD;  Location: [place];  Service: Cardiovascular;  Laterality: N/A;   TOE SURGERY     rt foot     There were no vitals filed for this visit.   Subjective Assessment - 08/28/21 1256     Subjective I have not been sleeping good.  Shoulder and back hurting    Currently in Pain? Yes    Pain Score 5     Pain Location Back    Pain  08/02/2019    Diarrhea 07/03/2018   Dyspepsia 07/03/2018   S/P lumbar spinal fusion 07/31/2017   Right low back pain 03/04/2017   H/O atrial flutter 03/06/2016   Obesity 11/06/2015   Urge incontinence 01/24/2015   Estrogen deficiency 59/47/0761   Lichen planus 51/83/4373   Encounter for Medicare annual wellness exam 06/17/2013   Screening mammogram, encounter for 06/15/2012   Prediabetes 06/03/2011   HYPERTENSION, BENIGN ESSENTIAL 10/23/2010   OSTEOARTHRITIS, HANDS, BILATERAL 01/24/2010   Hypothyroidism 08/24/2008   Vitamin D deficiency 06/06/2008   Hyperlipidemia 06/06/2008   MENOPAUSAL SYNDROME 03/29/2008   Osteopenia 03/29/2008   Allergic rhinitis 01/25/2008   IBS 01/25/2008   OSTEOARTHRITIS 01/25/2008   FIBROMYALGIA 01/25/2008    Sumner Boast, PT 08/28/2021, 1:28 PM  [place]. [place], [place], [address] Phone: [phone]   Fax:  [phone]  Name: Linda Mcgrath MRN: [id number] Date of Birth: 25-Jun-1933

## 2021-09-04 ENCOUNTER — Other Ambulatory Visit: Payer: Self-pay

## 2021-09-04 ENCOUNTER — Ambulatory Visit: Payer: Medicare Other | Admitting: Physical Therapy

## 2021-09-04 ENCOUNTER — Encounter: Payer: Self-pay | Admitting: Physical Therapy

## 2021-09-04 DIAGNOSIS — M545 Low back pain, unspecified: Secondary | ICD-10-CM

## 2021-09-04 DIAGNOSIS — M62830 Muscle spasm of back: Secondary | ICD-10-CM | POA: Diagnosis not present

## 2021-09-04 DIAGNOSIS — M25561 Pain in right knee: Secondary | ICD-10-CM | POA: Diagnosis not present

## 2021-09-04 DIAGNOSIS — R262 Difficulty in walking, not elsewhere classified: Secondary | ICD-10-CM | POA: Diagnosis not present

## 2021-09-04 DIAGNOSIS — G8929 Other chronic pain: Secondary | ICD-10-CM | POA: Diagnosis not present

## 2021-09-04 DIAGNOSIS — M542 Cervicalgia: Secondary | ICD-10-CM

## 2021-09-04 NOTE — Therapy (Signed)
10/18/2019   Osteoarthritis of right knee 08/02/2019   Diarrhea 07/03/2018   Dyspepsia 07/03/2018   S/P lumbar spinal fusion 07/31/2017   Right low back pain 03/04/2017   H/O atrial flutter 03/06/2016   Obesity 11/06/2015   Urge incontinence 01/24/2015   Estrogen deficiency 96/75/9163   Lichen planus 84/66/5993   Encounter for Medicare annual wellness exam 06/17/2013   Screening mammogram, encounter for 06/15/2012   Prediabetes 06/03/2011   HYPERTENSION, BENIGN ESSENTIAL 10/23/2010   OSTEOARTHRITIS, HANDS, BILATERAL 01/24/2010   Hypothyroidism 08/24/2008   Vitamin D deficiency 06/06/2008   Hyperlipidemia 06/06/2008   MENOPAUSAL SYNDROME 03/29/2008   Osteopenia 03/29/2008   Allergic rhinitis 01/25/2008   IBS 01/25/2008   OSTEOARTHRITIS 01/25/2008   FIBROMYALGIA 01/25/2008    Sumner Boast, PT 09/04/2021, 1:43 PM  [place]. [place], [place], [address] Phone: [phone]   Fax:  [phone]  Name: Linda Mcgrath MRN: [id number] Date of Birth: 03-Mar-1933  10/18/2019   Osteoarthritis of right knee 08/02/2019   Diarrhea 07/03/2018   Dyspepsia 07/03/2018   S/P lumbar spinal fusion 07/31/2017   Right low back pain 03/04/2017   H/O atrial flutter 03/06/2016   Obesity 11/06/2015   Urge incontinence 01/24/2015   Estrogen deficiency 96/75/9163   Lichen planus 84/66/5993   Encounter for Medicare annual wellness exam 06/17/2013   Screening mammogram, encounter for 06/15/2012   Prediabetes 06/03/2011   HYPERTENSION, BENIGN ESSENTIAL 10/23/2010   OSTEOARTHRITIS, HANDS, BILATERAL 01/24/2010   Hypothyroidism 08/24/2008   Vitamin D deficiency 06/06/2008   Hyperlipidemia 06/06/2008   MENOPAUSAL SYNDROME 03/29/2008   Osteopenia 03/29/2008   Allergic rhinitis 01/25/2008   IBS 01/25/2008   OSTEOARTHRITIS 01/25/2008   FIBROMYALGIA 01/25/2008    Sumner Boast, PT 09/04/2021, 1:43 PM  [place]. [place], [place], [address] Phone: [phone]   Fax:  [phone]  Name: Linda Mcgrath MRN: [id number] Date of Birth: 03-Mar-1933  [place]. [place], [place], [address] Phone: [phone]   Fax:  [phone]  Physical Therapy Treatment  Patient Details  Name: Linda Mcgrath MRN: [id number] Date of Birth: 02/13/1933 Referring Provider (PT): [name]   Encounter Date: 09/04/2021   PT End of Session - 09/04/21 1339     Visit Number 13    Date for PT Re-Evaluation 09/21/21    Authorization Type Medicare    PT Start Time 1300    PT Stop Time 0518    PT Time Calculation (min) 49 min    Activity Tolerance Patient tolerated treatment well    Behavior During Therapy [facility] for tasks assessed/performed             Past Medical History:  Diagnosis Date   Allergic rhinitis    Arthritis    Asthma    Cataract    Bil/lens implant   Colon polyp 1999   small polyp   Diverticulosis    Dysrhythmia    Fibromyalgia    Hyperlipidemia    Hypothyroidism    Internal hemorrhoids    Leg cramps    Lichen planus    Lung nodule    stable LUL 9 mm   Menopausal syndrome    Osteoarthritis    UTI (urinary tract infection)     Past Surgical History:  Procedure Laterality Date   Abd U/S  11/1998   negative   Abd U/S  01/2001   gallbladder polyps   ABDOMINAL HYSTERECTOMY  1975   total-fibroids   APPENDECTOMY  1975   BREAST BIOPSY     benign   BREAST EXCISIONAL BIOPSY Left 1957   CARDIOVERSION N/A 03/07/2016   Procedure: CARDIOVERSION;  Surgeon: Adrian Prows, MD;  Location: [place];  Service: Cardiovascular;  Laterality: N/A;   CATARACT EXTRACTION     bilateral   CHOLECYSTECTOMY     COLONOSCOPY  1998   Diverticulosis; polyp\   COLONOSCOPY  09/2002   Diverticulosis, hem   COLONOSCOPY  12/2007   diverticulosis, polyp   DEXA  10/2001   osteopenia   ELECTROPHYSIOLOGIC STUDY N/A 04/03/2016   Procedure: A-Flutter Ablation;  Surgeon: Will Meredith Leeds, MD;  Location: [place] [facility];  Service: Cardiovascular;  Laterality: N/A;    ESOPHAGOGASTRODUODENOSCOPY  2004   Hida scan  01/2001   Negative   KNEE SURGERY     right knee / 11/2004   LAMINECTOMY WITH POSTERIOR LATERAL ARTHRODESIS LEVEL 2 N/A 07/31/2017   Procedure: DECOMPRESSIVE LAMINECTOMY LUMABR FOUR-FIVE, LUMBAR FIVE-SACRAL ONE;  Surgeon: Eustace Moore, MD;  Location: [place];  Service: Neurosurgery;  Laterality: N/A;   laser surgery for glaucoma Left 09/21/2014   MULTIPLE TOOTH EXTRACTIONS     rentinal tear     ROTATOR CUFF REPAIR     x 2 /right shoulder   SKIN CANCER EXCISION     pre-melanoma / on face   TEE WITHOUT CARDIOVERSION N/A 03/07/2016   Procedure: TRANSESOPHAGEAL ECHOCARDIOGRAM (TEE);  Surgeon: Adrian Prows, MD;  Location: [place];  Service: Cardiovascular;  Laterality: N/A;   TOE SURGERY     rt foot     There were no vitals filed for this visit.   Subjective Assessment - 09/04/21 1301     Subjective I am feeling better today    Currently in Pain? Yes    Pain Score 3     Pain Location Back    Aggravating Factors  doing housework hurts

## 2021-09-18 ENCOUNTER — Encounter: Payer: Self-pay | Admitting: Physical Therapy

## 2021-09-18 ENCOUNTER — Ambulatory Visit: Payer: Medicare Other | Admitting: Physical Therapy

## 2021-09-18 ENCOUNTER — Other Ambulatory Visit: Payer: Self-pay

## 2021-09-18 DIAGNOSIS — M545 Low back pain, unspecified: Secondary | ICD-10-CM | POA: Diagnosis not present

## 2021-09-18 DIAGNOSIS — M25561 Pain in right knee: Secondary | ICD-10-CM | POA: Diagnosis not present

## 2021-09-18 DIAGNOSIS — M542 Cervicalgia: Secondary | ICD-10-CM

## 2021-09-18 DIAGNOSIS — M62830 Muscle spasm of back: Secondary | ICD-10-CM

## 2021-09-18 DIAGNOSIS — R262 Difficulty in walking, not elsewhere classified: Secondary | ICD-10-CM | POA: Diagnosis not present

## 2021-09-18 DIAGNOSIS — G8929 Other chronic pain: Secondary | ICD-10-CM | POA: Diagnosis not present

## 2021-09-18 NOTE — Therapy (Signed)
balance, Impaired flexibility, Improper body mechanics, Decreased mobility, Decreased strength, Postural dysfunction  Visit Diagnosis: Acute bilateral low back pain without sciatica  Difficulty in  walking, not elsewhere classified  Muscle spasm of back  Cervicalgia     Problem List Patient Active Problem List   Diagnosis Date Noted   Acute sinusitis 08/14/2021   Chronic upper back pain 08/30/2020   Aortic atherosclerosis ([place]) 05/31/2020   Abnormal CT scan, small bowel 05/18/2020   Generalized abdominal pain 05/17/2020   Duodenitis 03/13/2020   Right lateral abdominal pain 03/07/2020   Insomnia 12/15/2019   Chronic low back pain 10/18/2019   Positive depression screening 10/18/2019   Osteoarthritis of right knee 08/02/2019   Diarrhea 07/03/2018   Dyspepsia 07/03/2018   S/P lumbar spinal fusion 07/31/2017   Right low back pain 03/04/2017   H/O atrial flutter 03/06/2016   Obesity 11/06/2015   Urge incontinence 01/24/2015   Estrogen deficiency 33/38/3291   Lichen planus 91/66/0600   Encounter for Medicare annual wellness exam 06/17/2013   Screening mammogram, encounter for 06/15/2012   Prediabetes 06/03/2011   HYPERTENSION, BENIGN ESSENTIAL 10/23/2010   OSTEOARTHRITIS, HANDS, BILATERAL 01/24/2010   Hypothyroidism 08/24/2008   Vitamin D deficiency 06/06/2008   Hyperlipidemia 06/06/2008   MENOPAUSAL SYNDROME 03/29/2008   Osteopenia 03/29/2008   Allergic rhinitis 01/25/2008   IBS 01/25/2008   OSTEOARTHRITIS 01/25/2008   FIBROMYALGIA 01/25/2008    Sumner Boast, PT 09/18/2021, 1:35 PM  [place]. [place], [place], [address] Phone: [phone]   Fax:  [phone]  Name: Linda Mcgrath MRN: [id number] Date of Birth: 06-11-1933  balance, Impaired flexibility, Improper body mechanics, Decreased mobility, Decreased strength, Postural dysfunction  Visit Diagnosis: Acute bilateral low back pain without sciatica  Difficulty in  walking, not elsewhere classified  Muscle spasm of back  Cervicalgia     Problem List Patient Active Problem List   Diagnosis Date Noted   Acute sinusitis 08/14/2021   Chronic upper back pain 08/30/2020   Aortic atherosclerosis ([place]) 05/31/2020   Abnormal CT scan, small bowel 05/18/2020   Generalized abdominal pain 05/17/2020   Duodenitis 03/13/2020   Right lateral abdominal pain 03/07/2020   Insomnia 12/15/2019   Chronic low back pain 10/18/2019   Positive depression screening 10/18/2019   Osteoarthritis of right knee 08/02/2019   Diarrhea 07/03/2018   Dyspepsia 07/03/2018   S/P lumbar spinal fusion 07/31/2017   Right low back pain 03/04/2017   H/O atrial flutter 03/06/2016   Obesity 11/06/2015   Urge incontinence 01/24/2015   Estrogen deficiency 33/38/3291   Lichen planus 91/66/0600   Encounter for Medicare annual wellness exam 06/17/2013   Screening mammogram, encounter for 06/15/2012   Prediabetes 06/03/2011   HYPERTENSION, BENIGN ESSENTIAL 10/23/2010   OSTEOARTHRITIS, HANDS, BILATERAL 01/24/2010   Hypothyroidism 08/24/2008   Vitamin D deficiency 06/06/2008   Hyperlipidemia 06/06/2008   MENOPAUSAL SYNDROME 03/29/2008   Osteopenia 03/29/2008   Allergic rhinitis 01/25/2008   IBS 01/25/2008   OSTEOARTHRITIS 01/25/2008   FIBROMYALGIA 01/25/2008    Sumner Boast, PT 09/18/2021, 1:35 PM  [place]. [place], [place], [address] Phone: [phone]   Fax:  [phone]  Name: Linda Mcgrath MRN: [id number] Date of Birth: 06-11-1933  [place]. [place], [place], [address] Phone: [phone]   Fax:  [phone]  Physical Therapy Treatment  Patient Details  Name: Linda Mcgrath MRN: [id number] Date of Birth: Dec 18, 1932 Referring Provider (PT): [name]   Encounter Date: 09/18/2021   PT End of Session - 09/18/21 1332     Visit Number 14    Date for PT Re-Evaluation 09/21/21    Authorization Type Medicare    PT Start Time 1250    PT Stop Time 8850    PT Time Calculation (min) 58 min    Activity Tolerance Patient tolerated treatment well    Behavior During Therapy [facility] for tasks assessed/performed             Past Medical History:  Diagnosis Date   Allergic rhinitis    Arthritis    Asthma    Cataract    Bil/lens implant   Colon polyp 1999   small polyp   Diverticulosis    Dysrhythmia    Fibromyalgia    Hyperlipidemia    Hypothyroidism    Internal hemorrhoids    Leg cramps    Lichen planus    Lung nodule    stable LUL 9 mm   Menopausal syndrome    Osteoarthritis    UTI (urinary tract infection)     Past Surgical History:  Procedure Laterality Date   Abd U/S  11/1998   negative   Abd U/S  01/2001   gallbladder polyps   ABDOMINAL HYSTERECTOMY  1975   total-fibroids   APPENDECTOMY  1975   BREAST BIOPSY     benign   BREAST EXCISIONAL BIOPSY Left 1957   CARDIOVERSION N/A 03/07/2016   Procedure: CARDIOVERSION;  Surgeon: Adrian Prows, MD;  Location: [place];  Service: Cardiovascular;  Laterality: N/A;   CATARACT EXTRACTION     bilateral   CHOLECYSTECTOMY     COLONOSCOPY  1998   Diverticulosis; polyp\   COLONOSCOPY  09/2002   Diverticulosis, hem   COLONOSCOPY  12/2007   diverticulosis, polyp   DEXA  10/2001   osteopenia   ELECTROPHYSIOLOGIC STUDY N/A 04/03/2016   Procedure: A-Flutter Ablation;  Surgeon: Will Meredith Leeds, MD;  Location: [place] [facility];  Service: Cardiovascular;  Laterality: N/A;    ESOPHAGOGASTRODUODENOSCOPY  2004   Hida scan  01/2001   Negative   KNEE SURGERY     right knee / 11/2004   LAMINECTOMY WITH POSTERIOR LATERAL ARTHRODESIS LEVEL 2 N/A 07/31/2017   Procedure: DECOMPRESSIVE LAMINECTOMY LUMABR FOUR-FIVE, LUMBAR FIVE-SACRAL ONE;  Surgeon: Eustace Moore, MD;  Location: [place];  Service: Neurosurgery;  Laterality: N/A;   laser surgery for glaucoma Left 09/21/2014   MULTIPLE TOOTH EXTRACTIONS     rentinal tear     ROTATOR CUFF REPAIR     x 2 /right shoulder   SKIN CANCER EXCISION     pre-melanoma / on face   TEE WITHOUT CARDIOVERSION N/A 03/07/2016   Procedure: TRANSESOPHAGEAL ECHOCARDIOGRAM (TEE);  Surgeon: Adrian Prows, MD;  Location: [place];  Service: Cardiovascular;  Laterality: N/A;   TOE SURGERY     rt foot     There were no vitals filed for this visit.   Subjective Assessment - 09/18/21 1328     Subjective I did not get to see you last week, I am really stiff and sore, mostly my back and my right hip.  I think I have just been sitting more    Currently

## 2021-09-25 ENCOUNTER — Ambulatory Visit: Payer: Medicare Other | Admitting: Physical Therapy

## 2021-09-25 ENCOUNTER — Other Ambulatory Visit: Payer: Self-pay

## 2021-09-25 ENCOUNTER — Encounter: Payer: Self-pay | Admitting: Physical Therapy

## 2021-09-25 DIAGNOSIS — M545 Low back pain, unspecified: Secondary | ICD-10-CM | POA: Diagnosis not present

## 2021-09-25 DIAGNOSIS — M25561 Pain in right knee: Secondary | ICD-10-CM | POA: Diagnosis not present

## 2021-09-25 DIAGNOSIS — G8929 Other chronic pain: Secondary | ICD-10-CM | POA: Diagnosis not present

## 2021-09-25 DIAGNOSIS — M62830 Muscle spasm of back: Secondary | ICD-10-CM | POA: Diagnosis not present

## 2021-09-25 DIAGNOSIS — R262 Difficulty in walking, not elsewhere classified: Secondary | ICD-10-CM | POA: Diagnosis not present

## 2021-09-25 DIAGNOSIS — M542 Cervicalgia: Secondary | ICD-10-CM | POA: Diagnosis not present

## 2021-09-25 NOTE — Therapy (Signed)
pain    Consulted and Agree with Plan of Care Patient              Patient will benefit from skilled therapeutic intervention in order to improve the following deficits and impairments:  Abnormal gait, Decreased range of motion, Difficulty walking, Increased muscle spasms, Pain, Decreased activity tolerance, Decreased balance, Impaired flexibility, Improper body mechanics, Decreased mobility, Decreased strength, Postural dysfunction  Visit Diagnosis: Acute bilateral low back pain without sciatica - Plan: PT plan of care cert/re-cert  Difficulty in walking, not elsewhere classified - Plan: PT plan of care cert/re-cert  Muscle spasm of back - Plan: PT plan of care cert/re-cert  Cervicalgia - Plan: PT plan of care cert/re-cert     Problem List Patient Active Problem List   Diagnosis Date Noted   Acute sinusitis 08/14/2021   Chronic upper back pain 08/30/2020   Aortic atherosclerosis ([place]) 05/31/2020   Abnormal CT scan, small bowel 05/18/2020   Generalized abdominal pain 05/17/2020   Duodenitis 03/13/2020   Right lateral abdominal pain 03/07/2020   Insomnia 12/15/2019   Chronic low back pain 10/18/2019   Positive depression screening 10/18/2019   Osteoarthritis of right knee 08/02/2019   Diarrhea 07/03/2018   Dyspepsia 07/03/2018   S/P lumbar spinal fusion 07/31/2017   Right low back pain 03/04/2017   H/O atrial flutter 03/06/2016   Obesity 11/06/2015   Urge incontinence 01/24/2015   Estrogen deficiency 17/40/8144   Lichen planus 81/85/6314   Encounter for Medicare annual wellness exam 06/17/2013   Screening mammogram, encounter for 06/15/2012   Prediabetes 06/03/2011   HYPERTENSION, BENIGN ESSENTIAL 10/23/2010   OSTEOARTHRITIS, HANDS, BILATERAL 01/24/2010   Hypothyroidism 08/24/2008   Vitamin D deficiency 06/06/2008   Hyperlipidemia 06/06/2008   MENOPAUSAL SYNDROME 03/29/2008   Osteopenia 03/29/2008   Allergic rhinitis 01/25/2008   IBS 01/25/2008   OSTEOARTHRITIS 01/25/2008   FIBROMYALGIA 01/25/2008    Sumner Boast,  PT 09/25/2021, 1:42 PM  [place] [place], [place], [address] Phone: [phone]   Fax:  [phone]  Name: Linda Mcgrath MRN: [id number] Date of Birth: February 01, 1933  [place] [place]. [place], [place], [address] Phone: [phone]   Fax:  [phone]  Physical Therapy Treatment  Patient Details  Name: Linda Mcgrath MRN: [id number] Date of Birth: [birth year] Referring Provider (PT): [name]   Encounter Date: 09/25/2021   PT End of Session - 09/25/21 1333     Visit Number 15    Date for PT Re-Evaluation 10/24/21    Authorization Type Medicare    PT Start Time 1253    PT Stop Time 1340    PT Time Calculation (min) 47 min    Activity Tolerance Patient tolerated treatment well    Behavior During Therapy [facility] for tasks assessed/performed             Past Medical History:  Diagnosis Date   Allergic rhinitis    Arthritis    Asthma    Cataract    Bil/lens implant   Colon polyp 1999   small polyp   Diverticulosis    Dysrhythmia    Fibromyalgia    Hyperlipidemia    Hypothyroidism    Internal hemorrhoids    Leg cramps    Lichen planus    Lung nodule    stable LUL 9 mm   Menopausal syndrome    Osteoarthritis    UTI (urinary tract infection)     Past Surgical History:  Procedure Laterality Date   Abd U/S  11/1998   negative   Abd U/S  01/2001   gallbladder polyps   ABDOMINAL HYSTERECTOMY  1975   total-fibroids   APPENDECTOMY  1975   BREAST BIOPSY     benign   BREAST EXCISIONAL BIOPSY Left 1957   CARDIOVERSION N/A 03/07/2016   Procedure: CARDIOVERSION;  Surgeon: Adrian Prows, MD;  Location: [place];  Service: Cardiovascular;  Laterality: N/A;   CATARACT EXTRACTION     bilateral   CHOLECYSTECTOMY     COLONOSCOPY  1998   Diverticulosis; polyp\   COLONOSCOPY  09/2002   Diverticulosis, hem   COLONOSCOPY  12/2007   diverticulosis, polyp   DEXA  10/2001   osteopenia   ELECTROPHYSIOLOGIC STUDY N/A 04/03/2016   Procedure: A-Flutter Ablation;  Surgeon: Will Meredith Leeds, MD;  Location: [place] CV LAB;  Service: Cardiovascular;  Laterality: N/A;    ESOPHAGOGASTRODUODENOSCOPY  2004   Hida scan  01/2001   Negative   KNEE SURGERY     right knee / 11/2004   LAMINECTOMY WITH POSTERIOR LATERAL ARTHRODESIS LEVEL 2 N/A 07/31/2017   Procedure: DECOMPRESSIVE LAMINECTOMY LUMABR FOUR-FIVE, LUMBAR FIVE-SACRAL ONE;  Surgeon: Eustace Moore, MD;  Location: [place];  Service: Neurosurgery;  Laterality: N/A;   laser surgery for glaucoma Left 09/21/2014   MULTIPLE TOOTH EXTRACTIONS     rentinal tear     ROTATOR CUFF REPAIR     x 2 /right shoulder   SKIN CANCER EXCISION     pre-melanoma / on face   TEE WITHOUT CARDIOVERSION N/A 03/07/2016   Procedure: TRANSESOPHAGEAL ECHOCARDIOGRAM (TEE);  Surgeon: Adrian Prows, MD;  Location: [place];  Service: Cardiovascular;  Laterality: N/A;   TOE SURGERY     rt foot     There were no vitals filed for this visit.   Subjective Assessment - 09/25/21 1302     Subjective Patient reports that with the damp weather she is hurting a little more in the back, reports that she is worst in the morning    Currently in Pain? Yes    Pain  pain    Consulted and Agree with Plan of Care Patient              Patient will benefit from skilled therapeutic intervention in order to improve the following deficits and impairments:  Abnormal gait, Decreased range of motion, Difficulty walking, Increased muscle spasms, Pain, Decreased activity tolerance, Decreased balance, Impaired flexibility, Improper body mechanics, Decreased mobility, Decreased strength, Postural dysfunction  Visit Diagnosis: Acute bilateral low back pain without sciatica - Plan: PT plan of care cert/re-cert  Difficulty in walking, not elsewhere classified - Plan: PT plan of care cert/re-cert  Muscle spasm of back - Plan: PT plan of care cert/re-cert  Cervicalgia - Plan: PT plan of care cert/re-cert     Problem List Patient Active Problem List   Diagnosis Date Noted   Acute sinusitis 08/14/2021   Chronic upper back pain 08/30/2020   Aortic atherosclerosis ([place]) 05/31/2020   Abnormal CT scan, small bowel 05/18/2020   Generalized abdominal pain 05/17/2020   Duodenitis 03/13/2020   Right lateral abdominal pain 03/07/2020   Insomnia 12/15/2019   Chronic low back pain 10/18/2019   Positive depression screening 10/18/2019   Osteoarthritis of right knee 08/02/2019   Diarrhea 07/03/2018   Dyspepsia 07/03/2018   S/P lumbar spinal fusion 07/31/2017   Right low back pain 03/04/2017   H/O atrial flutter 03/06/2016   Obesity 11/06/2015   Urge incontinence 01/24/2015   Estrogen deficiency 17/40/8144   Lichen planus 81/85/6314   Encounter for Medicare annual wellness exam 06/17/2013   Screening mammogram, encounter for 06/15/2012   Prediabetes 06/03/2011   HYPERTENSION, BENIGN ESSENTIAL 10/23/2010   OSTEOARTHRITIS, HANDS, BILATERAL 01/24/2010   Hypothyroidism 08/24/2008   Vitamin D deficiency 06/06/2008   Hyperlipidemia 06/06/2008   MENOPAUSAL SYNDROME 03/29/2008   Osteopenia 03/29/2008   Allergic rhinitis 01/25/2008   IBS 01/25/2008   OSTEOARTHRITIS 01/25/2008   FIBROMYALGIA 01/25/2008    Sumner Boast,  PT 09/25/2021, 1:42 PM  [place]. [place], [place], [address] Phone: [phone]   Fax:  [phone]  Name: Linda Mcgrath MRN: [id number] Date of Birth: February 01, 1933

## 2021-09-26 NOTE — Progress Notes (Signed)
Subjective:   Linda Mcgrath is a 88 y.o. female who presents for Medicare Annual (Subsequent) preventive examination.  I connected with Linda Mcgrath today by telephone and verified that I am speaking with the correct person using two identifiers. Location patient: home Location provider: work Persons participating in the virtual visit: patient, Marine scientist.    I discussed the limitations, risks, security and privacy concerns of performing an evaluation and management service by telephone and the availability of in person appointments. I also discussed with the patient that there may be a patient responsible charge related to this service. The patient expressed understanding and verbally consented to this telephonic visit.    Interactive audio and video telecommunications were attempted between this provider and patient, however failed, due to patient having technical difficulties OR patient did not have access to video capability.  We continued and completed visit with audio only.  Some vital signs may be absent or patient reported.   Time Spent with patient on telephone encounter: 25 minutes  Review of Systems     Cardiac Risk Factors include: hypertension;dyslipidemia     Objective:    Today's Vitals   09/27/21 1157  Weight: 168 lb (76.2 kg)  Height: 5\' 3"  (1.6 m)  PainSc: 5    Body mass index is 29.76 kg/m.  Advanced Directives 09/27/2021 05/17/2021 09/26/2020 10/20/2019 04/14/2019 05/20/2018 08/04/2017  Does Patient Have a Medical Advance Directive? Yes No Yes Yes Yes Yes No  Type of Paramedic of [place];Living will - [place];Living will Healthcare Power of [place] of [place] -  Does patient want to make changes to medical advance directive? Yes (MAU/Ambulatory/Procedural Areas - Information given) - - - - No - Patient declined -  Copy of [place] in  Chart? - - No - copy requested - No - copy requested - -  Would patient like information on creating a medical advance directive? - No - Patient declined - - - - No - Patient declined    Current Medications (verified) Outpatient Encounter Medications as of 09/27/2021  Medication Sig   Ascorbic Acid (VITAMIN C PO) Take 500 mg by mouth daily.   B Complex Vitamins (VITAMIN B COMPLEX PO) Take 1 tablet by mouth daily.   B COMPLEX, FOLIC ACID, PO Take 983 mcg by mouth daily.   Biotin 10 MG TABS Take 1 tablet by mouth every morning.    Calcium Citrate-Vitamin D 250-200 MG-UNIT TABS Take 1 tablet by mouth daily.   cholestyramine (QUESTRAN) 4 g packet TAKE 1 PACKET DAILY   Cranberry 180 MG CAPS Take 2 capsules by mouth daily.   dextromethorphan-guaiFENesin (MUCINEX DM) 30-600 MG 12hr tablet Take 1 tablet by mouth 2 (two) times daily.   Fexofenadine HCl (MUCINEX ALLERGY PO) Take 1 tablet by mouth daily as needed (Allergies).    folic acid (FOLVITE) 1 MG tablet Take 1 mg by mouth daily.   levothyroxine (SYNTHROID) 88 MCG tablet Take 1 tablet (88 mcg total) by mouth daily with breakfast.   loratadine (CLARITIN) 10 MG tablet Take 10 mg by mouth daily as needed for allergies.    MAGNESIUM-OXIDE PO Take 1 tablet by mouth daily.   Methenamine-Sodium Salicylate (AZO URINARY TRACT DEFENSE PO) Take 1 capsule by mouth daily.   Multiple Vitamin (MULTIVITAMIN) capsule Take 1 capsule by mouth daily.   Psyllium (METAMUCIL FIBER PO) Take by mouth at bedtime. 2 tablespoon   Pumpkin Seed-Soy  Germ (AZO BLADDER CONTROL/GO-LESS PO) Take 1 tablet by mouth 2 (two) times a week.   solifenacin (VESICARE) 5 MG tablet Take 1 tablet (5 mg total) by mouth daily.   TURMERIC PO Take 1 capsule by mouth 2 (two) times daily.    vitamin B-12 (CYANOCOBALAMIN) 500 MCG tablet Take 500 mcg by mouth daily.   Vitamin D, Cholecalciferol, 50 MCG (2000 UT) CAPS Take 2 tablets by mouth daily.   azithromycin (ZITHROMAX Z-PAK) 250 MG tablet Take  2 pills by mouth today and then 1 pill daily for 4 days (Patient not taking: Reported on 09/27/2021)   benzonatate (TESSALON) 100 MG capsule Take 1 capsule (100 mg total) by mouth every 8 (eight) hours as needed for cough. (Patient not taking: Reported on 09/27/2021)   famotidine (PEPCID) 20 MG tablet Take 1 tablet (20 mg total) by mouth 2 (two) times daily. (Patient not taking: Reported on 09/27/2021)   fluticasone (FLONASE) 50 MCG/ACT nasal spray Place 1 spray into both nostrils daily for 3 days.   methocarbamol (ROBAXIN) 500 MG tablet TAKE 1 TABLET (500 MG TOTAL) BY MOUTH EVERY 8 (EIGHT) HOURS AS NEEDED FOR MUSCLE SPASMS. CAUTION OF SEDATION (Patient not taking: Reported on 09/27/2021)   mupirocin ointment (BACTROBAN) 2 % Apply 1 application topically 2 (two) times daily. (Patient not taking: Reported on 09/27/2021)   No facility-administered encounter medications on file as of 09/27/2021.    Allergies (verified) Shellfish allergy, Tetracycline, Amlodipine besylate, Ciprofloxacin, Codeine, Fosamax [alendronate sodium], Propoxyphene hcl, Sulfamethoxazole-trimethoprim, Tape, Xarelto [rivaroxaban], Amoxicillin, Other, and Penicillins   History: Past Medical History:  Diagnosis Date   Allergic rhinitis    Arthritis    Asthma    Cataract    Bil/lens implant   Colon polyp 1999   small polyp   Diverticulosis    Dysrhythmia    Fibromyalgia    Hyperlipidemia    Hypothyroidism    Internal hemorrhoids    Leg cramps    Lichen planus    Lung nodule    stable LUL 9 mm   Menopausal syndrome    Osteoarthritis    UTI (urinary tract infection)    Past Surgical History:  Procedure Laterality Date   Abd U/S  11/1998   negative   Abd U/S  01/2001   gallbladder polyps   ABDOMINAL HYSTERECTOMY  1975   total-fibroids   APPENDECTOMY  1975   BREAST BIOPSY     benign   BREAST EXCISIONAL BIOPSY Left 1957   CARDIOVERSION N/A 03/07/2016   Procedure: CARDIOVERSION;  Surgeon: Adrian Prows, MD;  Location: [place];  Service: Cardiovascular;  Laterality: N/A;   CATARACT EXTRACTION     bilateral   CHOLECYSTECTOMY     COLONOSCOPY  1998   Diverticulosis; polyp\   COLONOSCOPY  09/2002   Diverticulosis, hem   COLONOSCOPY  12/2007   diverticulosis, polyp   DEXA  10/2001   osteopenia   ELECTROPHYSIOLOGIC STUDY N/A 04/03/2016   Procedure: A-Flutter Ablation;  Surgeon: Will Meredith Leeds, MD;  Location: [place] CV LAB;  Service: Cardiovascular;  Laterality: N/A;   ESOPHAGOGASTRODUODENOSCOPY  2004   Hida scan  01/2001   Negative   KNEE SURGERY     right knee / 11/2004   LAMINECTOMY WITH POSTERIOR LATERAL ARTHRODESIS LEVEL 2 N/A 07/31/2017   Procedure: DECOMPRESSIVE LAMINECTOMY LUMABR FOUR-FIVE, LUMBAR FIVE-SACRAL ONE;  Surgeon: Eustace Moore, MD;  Location: [place];  Service: Neurosurgery;  Laterality: N/A;   laser surgery for glaucoma Left 09/21/2014  MULTIPLE TOOTH EXTRACTIONS     rentinal tear     ROTATOR CUFF REPAIR     x 2 /right shoulder   SKIN CANCER EXCISION     pre-melanoma / on face   TEE WITHOUT CARDIOVERSION N/A 03/07/2016   Procedure: TRANSESOPHAGEAL ECHOCARDIOGRAM (TEE);  Surgeon: Adrian Prows, MD;  [facility]: [facility] END[facility];  Service: Cardiovascular;  Laterality: N/A;   TOE SURGERY     rt foot    Family History  Problem Relation Age of Onset   Parkinsonism Father    Colon cancer Brother    Allergies Mother    Heart disease Mother    Kidney failure Son        congenital  (kidney transplant)    Heart failure Son        died suddenly of CHF    Social History   Socioeconomic History   Marital status: Widowed    Spouse name: Not on file   Number of children: 2   Years of education: Not on file   Highest education level: Not on file  Occupational History   Occupation: TRAVEL AGENT    Employer: [employer]  Tobacco Use   Smoking status: Never   Smokeless tobacco: Never  Vaping Use   Vaping Use: Never used  Substance and Sexual Activity   Alcohol use: No     Alcohol/week: 0.0 standard drinks    Comment: occasional-rare   Drug use: No   Sexual activity: Never  Other Topics Concern   Not on file  Social History Narrative   Non-smoker      occ alcohol      Married-husband does the cooking      Works outside the home   Social Determinants of Radio broadcast assistant Strain: Low Risk    Difficulty of Paying Living Expenses: Not hard at all  Food Insecurity: No Food Insecurity   Worried About Charity fundraiser in the Last Year: Never true   Arboriculturist in the Last Year: Never true  Transportation Needs: No Transportation Needs   Lack of Transportation (Medical): No   Lack of Transportation (Non-Medical): No  Physical Activity: Insufficiently Active   Days of Exercise per Week: 1 day   Minutes of Exercise per Session: 60 min  Stress: No Stress Concern Present   Feeling of Stress : Not at all  Social Connections: Socially Isolated   Frequency of Communication with Friends and Family: More than three times a week   Frequency of Social Gatherings with Friends and Family: Three times a week   Attends Religious Services: Never   Active Member of Clubs or Organizations: No   Attends Archivist Meetings: Never   Marital Status: Widowed    Tobacco Counseling Counseling given: Not Answered   Clinical Intake:  Pre-visit preparation completed: Yes  Pain : 0-10 Pain Score: 5  Pain Location: Eye Pain Orientation: Right, Left     BMI - recorded: 29.76 Nutritional Status: BMI 25 -29 Overweight Nutritional Risks: None Diabetes: No  How often do you need to have someone help you when you read instructions, pamphlets, or other written materials from your doctor or pharmacy?: 1 - Never  Diabetic? No  Interpreter Needed?: No  Information entered by :: Orrin Brigham LPN   Activities of Daily Living In your present state of health, do you have any difficulty performing the following activities: 09/27/2021   Hearing? N  Vision? N  Difficulty concentrating or  making decisions? N  Walking or climbing stairs? N  Dressing or bathing? N  Doing errands, shopping? N  Preparing Food and eating ? N  Using the Toilet? N  In the past six months, have you accidently leaked urine? Y  Comment wears pad  Do you have problems with loss of bowel control? N  Managing your Medications? N  Managing your Finances? N  Housekeeping or managing your Housekeeping? N  Some recent data might be hidden    Patient Care Team: [name], Wynelle Fanny, MD as PCP - General Rana Snare, MD (Inactive) as Consulting Physician (Urology) Sharol Roussel Elwyn Lade, MD as Referring Physician (Dermatology) Luberta Mutter, MD as Consulting Physician (Ophthalmology)  Indicate any recent Medical Services you may have received from other than [facility] providers in the past year (date may be approximate).     Assessment:   This is a routine wellness examination for Jyla.  Hearing/Vision screen Hearing Screening - Comments:: No issues Vision Screening - Comments:: Last exam 08/23/21, Dr. Celene Squibb    Dietary issues and exercise activities discussed: Current Exercise Habits: Home exercise routine;Structured exercise class, Type of exercise: Other - see comments;walking (PT and walks in the house), Time (Minutes): 60, Frequency (Times/Week): 1, Weekly Exercise (Minutes/Week): 60, Intensity: Mild   Goals Addressed             This Visit's Progress    Patient Stated       Would like to maintain current routine       Depression Screen PHQ 2/9 Scores 09/27/2021 09/26/2020 10/18/2019 03/25/2019 11/17/2017 11/15/2016 11/06/2015  PHQ - 2 Score 0 0 5 0 0 0 0  PHQ- 9 Score - 0 13 - - - -    Fall Risk Fall Risk  09/27/2021 09/26/2020 03/25/2019 11/17/2017 11/15/2016  Falls in the past year? 1 0 0 No No  Number falls in past yr: 0 0 0 - -  Injury with Fall? 0 0 0 - -  Risk for fall due to : Other (Comment) Impaired balance/gait - - -  Risk for fall  due to: Comment fell helping son - - - -  Follow up Falls prevention discussed Falls evaluation completed;Falls prevention discussed Falls evaluation completed - -    FALL RISK PREVENTION PERTAINING TO THE HOME:  Any stairs in or around the home? Yes  If so, are there any without handrails? No  Home free of loose throw rugs in walkways, pet beds, electrical cords, etc? Yes  Adequate lighting in your home to reduce risk of falls? Yes   ASSISTIVE DEVICES UTILIZED TO PREVENT FALLS:  Life alert? No  Use of a cane, walker or w/c? Yes , cane Grab bars in the bathroom? Yes  Shower chair or bench in shower? No  Elevated toilet seat or a handicapped toilet? Yes   TIMED UP AND GO:  Was the test performed? No .    Cognitive Function: Normal cognitive status assessed by  this Nurse Health Advisor. No abnormalities found.   MMSE - Mini Mental State Exam 09/26/2020 11/15/2016 11/06/2015  Orientation to time 5 5 5   Orientation to Place 5 5 5   Registration 3 3 3   Attention/ Calculation 5 0 5  Recall 0 3 3  Language- name 2 objects - 0 0  Language- repeat 1 1 1   Language- follow 3 step command - 3 3  Language- read & follow direction - 0 1  Write a sentence - 0 0  Copy design - 0  0  Total score - 20 26        Immunizations Immunization History  Administered Date(s) Administered   Influenza Split 05/11/2011   Influenza Whole 05/27/2007, 05/21/2008, 04/29/2009, 05/28/2012   Influenza, High Dose Seasonal PF 05/12/2014, 05/20/2016, 05/26/2017, 05/11/2020, 05/26/2021   Influenza,inj,Quad PF,6+ Mos 05/08/2018, 04/15/2019   Influenza-Unspecified 05/07/2013   PFIZER(Purple Top)SARS-COV-2 Vaccination 09/11/2019, 10/02/2019, 06/10/2020   Pfizer Covid-19 Vaccine Bivalent Booster 7yrs & up 08/13/2021   Pneumococcal Conjugate-13 10/25/2014   Pneumococcal Polysaccharide-23 08/27/2003   Td 12/26/2003   Tdap 11/07/2014   Zoster Recombinat (Shingrix) 04/15/2019, 06/15/2019   Zoster, Live  03/29/2008    TDAP status: Up to date  Flu Vaccine status: Up to date  Pneumococcal vaccine status: Up to date  Covid-19 vaccine status: Completed vaccines  Qualifies for Shingles Vaccine? Yes   Zostavax completed Yes   Shingrix Completed?: Yes  Screening Tests Health Maintenance  Topic Date Due   MAMMOGRAM  02/13/2022   TETANUS/TDAP  11/06/2024   Pneumonia Vaccine 79+ Years old  Completed   INFLUENZA VACCINE  Completed   DEXA SCAN  Completed   COVID-19 Vaccine  Completed   Zoster Vaccines- Shingrix  Completed   HPV VACCINES  Aged Out    Health Maintenance  There are no preventive care reminders to display for this patient.   Colorectal cancer screening: No longer required.   Mammogram status: Completed 02/13/21. Repeat every year  Bone Density status: No longer required   Lung Cancer Screening: (Low Dose CT Chest recommended if Age 37-80 years, 30 pack-year currently smoking OR have quit w/in 15years.) does not qualify.     Additional Screening:  Hepatitis C Screening: does not qualify  Vision Screening: Recommended annual ophthalmology exams for early detection of glaucoma and other disorders of the eye. Is the patient up to date with their annual eye exam?  Yes  Who is the provider or what is the name of the office in which the patient attends annual eye exams? Dr. Celene Squibb    Dental Screening: Recommended annual dental exams for proper oral hygiene  Community Resource Referral / Chronic Care Management: CRR required this visit?  No   CCM required this visit?  No      Plan:     I have personally reviewed and noted the following in the patients chart:   Medical and social history Use of alcohol, tobacco or illicit drugs  Current medications and supplements including opioid prescriptions.  Functional ability and status Nutritional status Physical activity Advanced directives List of other physicians Hospitalizations, surgeries, and ER visits in  previous 12 months Vitals Screenings to include cognitive, depression, and falls Referrals and appointments  In addition, I have reviewed and discussed with patient certain preventive protocols, quality metrics, and best practice recommendations. A written personalized care plan for preventive services as well as general preventive health recommendations were provided to patient.   Due to this being a telephonic visit, the after visit summary with patients personalized plan was offered to patient via mail or my-chart.  Patient preferred to pick up at office at next visit.   Loma Messing, LPN   03/01/9389   Nurse Health Advisor  Nurse Notes: none

## 2021-09-27 ENCOUNTER — Ambulatory Visit (INDEPENDENT_AMBULATORY_CARE_PROVIDER_SITE_OTHER): Payer: Medicare Other

## 2021-09-27 VITALS — Ht 63.0 in | Wt 168.0 lb

## 2021-09-27 DIAGNOSIS — Z0000 Encounter for general adult medical examination without abnormal findings: Secondary | ICD-10-CM | POA: Diagnosis not present

## 2021-09-27 NOTE — Patient Instructions (Signed)
Linda Mcgrath , Thank you for taking time to complete your Medicare Wellness Visit. I appreciate your ongoing commitment to your health goals. Please review the following plan we discussed and let me know if I can assist you in the future.   Screening recommendations/referrals: Colonoscopy: no longer required  Mammogram: up to date, completed 02/13/21, due 02/13/22 Bone Density: no longer required  Recommended yearly ophthalmology/optometry visit for glaucoma screening and checkup Recommended yearly dental visit for hygiene and checkup  Vaccinations: Influenza vaccine: up to date Pneumococcal vaccine: up to date Tdap vaccine: up to date, completed 11/07/14, due 11/06/24 Shingles vaccine: up to date    Covid-19:up to date  Advanced directives: Please bring a copy of Living Will and/or [place] [facility] for your chart.   Conditions/risks identified: see problem list  Next appointment: Follow up in one year for your annual wellness visit 09/30/22 @ 12:00pm, this will be a telephone visit   Preventive Care 65 Years and Older, Female Preventive care refers to lifestyle choices and visits with your health care provider that can promote health and wellness. What does preventive care include? A yearly physical exam. This is also called an annual well check. Dental exams once or twice a year. Routine eye exams. Ask your health care provider how often you should have your eyes checked. Personal lifestyle choices, including: Daily care of your teeth and gums. Regular physical activity. Eating a healthy diet. Avoiding tobacco and drug use. Limiting alcohol use. Practicing safe sex. Taking low-dose aspirin every day. Taking vitamin and mineral supplements as recommended by your health care provider. What happens during an annual well check? The services and screenings done by your health care provider during your annual well check will depend on your age, overall health,  lifestyle risk factors, and family history of disease. Counseling  Your health care provider may ask you questions about your: Alcohol use. Tobacco use. Drug use. Emotional well-being. Home and relationship well-being. Sexual activity. Eating habits. History of falls. Memory and ability to understand (cognition). Work and work Statistician. Reproductive health. Screening  You may have the following tests or measurements: Height, weight, and BMI. Blood pressure. Lipid and cholesterol levels. These may be checked every 5 years, or more frequently if you are over 50 years old. Skin check. Lung cancer screening. You may have this screening every year starting at age 93 if you have a 30-pack-year history of smoking and currently smoke or have quit within the past 15 years. Fecal occult blood test (FOBT) of the stool. You may have this test every year starting at age 68. Flexible sigmoidoscopy or colonoscopy. You may have a sigmoidoscopy every 5 years or a colonoscopy every 10 years starting at age 7. Hepatitis C blood test. Hepatitis B blood test. Sexually transmitted disease (STD) testing. Diabetes screening. This is done by checking your blood sugar (glucose) after you have not eaten for a while (fasting). You may have this done every 1-3 years. Bone density scan. This is done to screen for osteoporosis. You may have this done starting at age 71. Mammogram. This may be done every 1-2 years. Talk to your health care provider about how often you should have regular mammograms. Talk with your health care provider about your test results, treatment options, and if necessary, the need for more tests. Vaccines  Your health care provider may recommend certain vaccines, such as: Influenza vaccine. This is recommended every year. Tetanus, diphtheria, and acellular pertussis (Tdap, Td) vaccine. You may  need a Td booster every 10 years. Zoster vaccine. You may need this after age  41. Pneumococcal 13-valent conjugate (PCV13) vaccine. One dose is recommended after age 42. Pneumococcal polysaccharide (PPSV23) vaccine. One dose is recommended after age 69. Talk to your health care provider about which screenings and vaccines you need and how often you need them. This information is not intended to replace advice given to you by your health care provider. Make sure you discuss any questions you have with your health care provider. Document Released: 09/08/2015 Document Revised: 05/01/2016 Document Reviewed: 06/13/2015 Elsevier Interactive Patient Education  2017 [employer] Prevention in the Home Falls can cause injuries. They can happen to people of all ages. There are many things you can do to make your home safe and to help prevent falls. What can I do on the outside of my home? Regularly fix the edges of walkways and driveways and fix any cracks. Remove anything that might make you trip as you walk through a door, such as a raised step or threshold. Trim any bushes or trees on the path to your home. Use bright outdoor lighting. Clear any walking paths of anything that might make someone trip, such as rocks or tools. Regularly check to see if handrails are loose or broken. Make sure that both sides of any steps have handrails. Any raised decks and porches should have guardrails on the edges. Have any leaves, snow, or ice cleared regularly. Use sand or salt on walking paths during winter. Clean up any spills in your garage right away. This includes oil or grease spills. What can I do in the bathroom? Use night lights. Install grab bars by the toilet and in the tub and shower. Do not use towel bars as grab bars. Use non-skid mats or decals in the tub or shower. If you need to sit down in the shower, use a plastic, non-slip stool. Keep the floor dry. Clean up any water that spills on the floor as soon as it happens. Remove soap buildup in the tub or shower  regularly. Attach bath mats securely with double-sided non-slip rug tape. Do not have throw rugs and other things on the floor that can make you trip. What can I do in the bedroom? Use night lights. Make sure that you have a light by your bed that is easy to reach. Do not use any sheets or blankets that are too big for your bed. They should not hang down onto the floor. Have a firm chair that has side arms. You can use this for support while you get dressed. Do not have throw rugs and other things on the floor that can make you trip. What can I do in the kitchen? Clean up any spills right away. Avoid walking on wet floors. Keep items that you use a lot in easy-to-reach places. If you need to reach something above you, use a strong step stool that has a grab bar. Keep electrical cords out of the way. Do not use floor polish or wax that makes floors slippery. If you must use wax, use non-skid floor wax. Do not have throw rugs and other things on the floor that can make you trip. What can I do with my stairs? Do not leave any items on the stairs. Make sure that there are handrails on both sides of the stairs and use them. Fix handrails that are broken or loose. Make sure that handrails are as long as the stairways.  Check any carpeting to make sure that it is firmly attached to the stairs. Fix any carpet that is loose or worn. Avoid having throw rugs at the top or bottom of the stairs. If you do have throw rugs, attach them to the floor with carpet tape. Make sure that you have a light switch at the top of the stairs and the bottom of the stairs. If you do not have them, ask someone to add them for you. What else can I do to help prevent falls? Wear shoes that: Do not have high heels. Have rubber bottoms. Are comfortable and fit you well. Are closed at the toe. Do not wear sandals. If you use a stepladder: Make sure that it is fully opened. Do not climb a closed stepladder. Make sure that  both sides of the stepladder are locked into place. Ask someone to hold it for you, if possible. Clearly mark and make sure that you can see: Any grab bars or handrails. First and last steps. Where the edge of each step is. Use tools that help you move around (mobility aids) if they are needed. These include: Canes. Walkers. Scooters. Crutches. Turn on the lights when you go into a dark area. Replace any light bulbs as soon as they burn out. Set up your furniture so you have a clear path. Avoid moving your furniture around. If any of your floors are uneven, fix them. If there are any pets around you, be aware of where they are. Review your medicines with your doctor. Some medicines can make you feel dizzy. This can increase your chance of falling. Ask your doctor what other things that you can do to help prevent falls. This information is not intended to replace advice given to you by your health care provider. Make sure you discuss any questions you have with your health care provider. Document Released: 06/08/2009 Document Revised: 01/18/2016 Document Reviewed: 09/16/2014 Elsevier Interactive Patient Education  2017 [employer].

## 2021-10-02 ENCOUNTER — Ambulatory Visit: Payer: Medicare Other | Attending: Family Medicine | Admitting: Physical Therapy

## 2021-10-02 ENCOUNTER — Other Ambulatory Visit: Payer: Self-pay

## 2021-10-02 ENCOUNTER — Encounter: Payer: Self-pay | Admitting: Physical Therapy

## 2021-10-02 DIAGNOSIS — M545 Low back pain, unspecified: Secondary | ICD-10-CM | POA: Diagnosis not present

## 2021-10-02 DIAGNOSIS — M25561 Pain in right knee: Secondary | ICD-10-CM | POA: Diagnosis not present

## 2021-10-02 DIAGNOSIS — R262 Difficulty in walking, not elsewhere classified: Secondary | ICD-10-CM | POA: Diagnosis not present

## 2021-10-02 DIAGNOSIS — G8929 Other chronic pain: Secondary | ICD-10-CM | POA: Diagnosis not present

## 2021-10-02 DIAGNOSIS — M542 Cervicalgia: Secondary | ICD-10-CM | POA: Insufficient documentation

## 2021-10-02 DIAGNOSIS — M62830 Muscle spasm of back: Secondary | ICD-10-CM | POA: Insufficient documentation

## 2021-10-02 NOTE — Therapy (Signed)
bowel 05/18/2020   Generalized abdominal pain  05/17/2020   Duodenitis 03/13/2020   Right lateral abdominal pain 03/07/2020   Insomnia 12/15/2019   Chronic low back pain 10/18/2019   Positive depression screening 10/18/2019   Osteoarthritis of right knee 08/02/2019   Diarrhea 07/03/2018   Dyspepsia 07/03/2018   S/P lumbar spinal fusion 07/31/2017   Right low back pain 03/04/2017   H/O atrial flutter 03/06/2016   Obesity 11/06/2015   Urge incontinence 01/24/2015   Estrogen deficiency 48/18/5631   Lichen planus 49/70/2637   Encounter for Medicare annual wellness exam 06/17/2013   Screening mammogram, encounter for 06/15/2012   Prediabetes 06/03/2011   HYPERTENSION, BENIGN ESSENTIAL 10/23/2010   OSTEOARTHRITIS, HANDS, BILATERAL 01/24/2010   Hypothyroidism 08/24/2008   Vitamin D deficiency 06/06/2008   Hyperlipidemia 06/06/2008   MENOPAUSAL SYNDROME 03/29/2008   Osteopenia 03/29/2008   Allergic rhinitis 01/25/2008   IBS 01/25/2008   OSTEOARTHRITIS 01/25/2008   FIBROMYALGIA 01/25/2008    Sumner Boast, PT 10/02/2021, 1:46 PM  [place] [place], [place], [address] Phone: [phone]   Fax:  [phone]  Name: Linda Mcgrath MRN: [id number] Date of Birth: June 04, 1933  [address] [place], [place], [address] Phone: [phone]   Fax:  [phone]  Physical Therapy Treatment  Patient Details  Name: Linda Mcgrath MRN: [id number] Date of Birth: March 21, 1933 Referring Provider (PT): [name]   Encounter Date: 10/02/2021   PT End of Session - 10/02/21 1343     Visit Number 16    Date for PT Re-Evaluation 10/24/21    Authorization Type Medicare    PT Start Time 1259    PT Stop Time 1350    PT Time Calculation (min) 51 min    Activity Tolerance Patient tolerated treatment well    Behavior During Therapy [facility] for tasks assessed/performed             Past Medical History:  Diagnosis Date   Allergic rhinitis    Arthritis    Asthma    Cataract    Bil/lens implant   Colon polyp 1999   small polyp   Diverticulosis    Dysrhythmia    Fibromyalgia    Hyperlipidemia    Hypothyroidism    Internal hemorrhoids    Leg cramps    Lichen planus    Lung nodule    stable LUL 9 mm   Menopausal syndrome    Osteoarthritis    UTI (urinary tract infection)     Past Surgical History:  Procedure Laterality Date   Abd U/S  11/1998   negative   Abd U/S  01/2001   gallbladder polyps   ABDOMINAL HYSTERECTOMY  1975   total-fibroids   APPENDECTOMY  1975   BREAST BIOPSY     benign   BREAST EXCISIONAL BIOPSY Left 1957   CARDIOVERSION N/A 03/07/2016   Procedure: CARDIOVERSION;  Surgeon: Adrian Prows, MD;  Location: [place];  Service: Cardiovascular;  Laterality: N/A;   CATARACT EXTRACTION     bilateral   CHOLECYSTECTOMY     COLONOSCOPY  1998   Diverticulosis; polyp\   COLONOSCOPY  09/2002   Diverticulosis, hem   COLONOSCOPY  12/2007   diverticulosis, polyp   DEXA  10/2001   osteopenia   ELECTROPHYSIOLOGIC STUDY N/A 04/03/2016   Procedure: A-Flutter Ablation;  Surgeon: Will Meredith Leeds, MD;  Location: [place] [facility];  Service: Cardiovascular;  Laterality: N/A;    ESOPHAGOGASTRODUODENOSCOPY  2004   Hida scan  01/2001   Negative   KNEE SURGERY     right knee / 11/2004   LAMINECTOMY WITH POSTERIOR LATERAL ARTHRODESIS LEVEL 2 N/A 07/31/2017   Procedure: DECOMPRESSIVE LAMINECTOMY LUMABR FOUR-FIVE, LUMBAR FIVE-SACRAL ONE;  Surgeon: Eustace Moore, MD;  Location: [place];  Service: Neurosurgery;  Laterality: N/A;   laser surgery for glaucoma Left 09/21/2014   MULTIPLE TOOTH EXTRACTIONS     rentinal tear     ROTATOR CUFF REPAIR     x 2 /right shoulder   SKIN CANCER EXCISION     pre-melanoma / on face   TEE WITHOUT CARDIOVERSION N/A 03/07/2016   Procedure: TRANSESOPHAGEAL ECHOCARDIOGRAM (TEE);  Surgeon: Adrian Prows, MD;  Location: [place];  Service: Cardiovascular;  Laterality: N/A;   TOE SURGERY     rt foot     There were no vitals filed for this visit.   Subjective Assessment - 10/02/21 1303     Subjective Patient reports that the back is feeling a litle better, but having worse knee pain and right upper shoulder/neck pain, reports that she did try to run the vacuum    Currently in Pain?  [address] [place], [place], [address] Phone: [phone]   Fax:  [phone]  Physical Therapy Treatment  Patient Details  Name: Linda Mcgrath MRN: [id number] Date of Birth: March 21, 1933 Referring Provider (PT): [name]   Encounter Date: 10/02/2021   PT End of Session - 10/02/21 1343     Visit Number 16    Date for PT Re-Evaluation 10/24/21    Authorization Type Medicare    PT Start Time 1259    PT Stop Time 1350    PT Time Calculation (min) 51 min    Activity Tolerance Patient tolerated treatment well    Behavior During Therapy [facility] for tasks assessed/performed             Past Medical History:  Diagnosis Date   Allergic rhinitis    Arthritis    Asthma    Cataract    Bil/lens implant   Colon polyp 1999   small polyp   Diverticulosis    Dysrhythmia    Fibromyalgia    Hyperlipidemia    Hypothyroidism    Internal hemorrhoids    Leg cramps    Lichen planus    Lung nodule    stable LUL 9 mm   Menopausal syndrome    Osteoarthritis    UTI (urinary tract infection)     Past Surgical History:  Procedure Laterality Date   Abd U/S  11/1998   negative   Abd U/S  01/2001   gallbladder polyps   ABDOMINAL HYSTERECTOMY  1975   total-fibroids   APPENDECTOMY  1975   BREAST BIOPSY     benign   BREAST EXCISIONAL BIOPSY Left 1957   CARDIOVERSION N/A 03/07/2016   Procedure: CARDIOVERSION;  Surgeon: Adrian Prows, MD;  Location: [place];  Service: Cardiovascular;  Laterality: N/A;   CATARACT EXTRACTION     bilateral   CHOLECYSTECTOMY     COLONOSCOPY  1998   Diverticulosis; polyp\   COLONOSCOPY  09/2002   Diverticulosis, hem   COLONOSCOPY  12/2007   diverticulosis, polyp   DEXA  10/2001   osteopenia   ELECTROPHYSIOLOGIC STUDY N/A 04/03/2016   Procedure: A-Flutter Ablation;  Surgeon: Will Meredith Leeds, MD;  Location: [place] [facility];  Service: Cardiovascular;  Laterality: N/A;    ESOPHAGOGASTRODUODENOSCOPY  2004   Hida scan  01/2001   Negative   KNEE SURGERY     right knee / 11/2004   LAMINECTOMY WITH POSTERIOR LATERAL ARTHRODESIS LEVEL 2 N/A 07/31/2017   Procedure: DECOMPRESSIVE LAMINECTOMY LUMABR FOUR-FIVE, LUMBAR FIVE-SACRAL ONE;  Surgeon: Eustace Moore, MD;  Location: [place];  Service: Neurosurgery;  Laterality: N/A;   laser surgery for glaucoma Left 09/21/2014   MULTIPLE TOOTH EXTRACTIONS     rentinal tear     ROTATOR CUFF REPAIR     x 2 /right shoulder   SKIN CANCER EXCISION     pre-melanoma / on face   TEE WITHOUT CARDIOVERSION N/A 03/07/2016   Procedure: TRANSESOPHAGEAL ECHOCARDIOGRAM (TEE);  Surgeon: Adrian Prows, MD;  Location: [place];  Service: Cardiovascular;  Laterality: N/A;   TOE SURGERY     rt foot     There were no vitals filed for this visit.   Subjective Assessment - 10/02/21 1303     Subjective Patient reports that the back is feeling a litle better, but having worse knee pain and right upper shoulder/neck pain, reports that she did try to run the vacuum    Currently in Pain?

## 2021-10-07 ENCOUNTER — Other Ambulatory Visit: Payer: Self-pay | Admitting: Family Medicine

## 2021-10-08 NOTE — Telephone Encounter (Signed)
Pt is overdue for CPE or at least a f/u either one with labs prior. Please schedule appts and then route to me so I can refill med once appt has been made

## 2021-10-09 NOTE — Telephone Encounter (Signed)
Pt scheduled a cpe/lab in march

## 2021-10-10 ENCOUNTER — Ambulatory Visit: Payer: Medicare Other | Admitting: Physical Therapy

## 2021-10-10 ENCOUNTER — Other Ambulatory Visit: Payer: Self-pay

## 2021-10-10 ENCOUNTER — Encounter: Payer: Self-pay | Admitting: Physical Therapy

## 2021-10-10 DIAGNOSIS — M545 Low back pain, unspecified: Secondary | ICD-10-CM | POA: Diagnosis not present

## 2021-10-10 DIAGNOSIS — R262 Difficulty in walking, not elsewhere classified: Secondary | ICD-10-CM | POA: Diagnosis not present

## 2021-10-10 DIAGNOSIS — M25561 Pain in right knee: Secondary | ICD-10-CM | POA: Diagnosis not present

## 2021-10-10 DIAGNOSIS — G8929 Other chronic pain: Secondary | ICD-10-CM | POA: Diagnosis not present

## 2021-10-10 DIAGNOSIS — M542 Cervicalgia: Secondary | ICD-10-CM | POA: Diagnosis not present

## 2021-10-10 DIAGNOSIS — M62830 Muscle spasm of back: Secondary | ICD-10-CM

## 2021-10-10 NOTE — Therapy (Signed)
[address]. [place], [place], [address] Phone: [phone]   Fax:  [phone]  Physical Therapy Treatment  Patient Details  Name: Linda Mcgrath MRN: [id number] Date of Birth: Feb 21, 1933 Referring Provider (PT): [name]   Encounter Date: 10/10/2021   PT End of Session - 10/10/21 1425     Visit Number 17    Date for PT Re-Evaluation 10/24/21    Authorization Type Medicare    PT Start Time 1350    PT Stop Time 1440    PT Time Calculation (min) 50 min    Activity Tolerance Patient tolerated treatment well    Behavior During Therapy [facility] for tasks assessed/performed             Past Medical History:  Diagnosis Date   Allergic rhinitis    Arthritis    Asthma    Cataract    Bil/lens implant   Colon polyp 1999   small polyp   Diverticulosis    Dysrhythmia    Fibromyalgia    Hyperlipidemia    Hypothyroidism    Internal hemorrhoids    Leg cramps    Lichen planus    Lung nodule    stable LUL 9 mm   Menopausal syndrome    Osteoarthritis    UTI (urinary tract infection)     Past Surgical History:  Procedure Laterality Date   Abd U/S  11/1998   negative   Abd U/S  01/2001   gallbladder polyps   ABDOMINAL HYSTERECTOMY  1975   total-fibroids   APPENDECTOMY  1975   BREAST BIOPSY     benign   BREAST EXCISIONAL BIOPSY Left 1957   CARDIOVERSION N/A 03/07/2016   Procedure: CARDIOVERSION;  Surgeon: Adrian Prows, MD;  Location: [place];  Service: Cardiovascular;  Laterality: N/A;   CATARACT EXTRACTION     bilateral   CHOLECYSTECTOMY     COLONOSCOPY  1998   Diverticulosis; polyp_0   COLONOSCOPY  09/2002   Diverticulosis, hem   COLONOSCOPY  12/2007   diverticulosis, polyp   DEXA  10/2001   osteopenia   ELECTROPHYSIOLOGIC STUDY N/A 04/03/2016   Procedure: A-Flutter Ablation;  Surgeon: Will Meredith Leeds, MD;  Location: [place] [facility];  Service: Cardiovascular;  Laterality: N/A;    ESOPHAGOGASTRODUODENOSCOPY  2004   Hida scan  01/2001   Negative   KNEE SURGERY     right knee / 11/2004   LAMINECTOMY WITH POSTERIOR LATERAL ARTHRODESIS LEVEL 2 N/A 07/31/2017   Procedure: DECOMPRESSIVE LAMINECTOMY LUMABR FOUR-FIVE, LUMBAR FIVE-SACRAL ONE;  Surgeon: Eustace Moore, MD;  Location: [place];  Service: Neurosurgery;  Laterality: N/A;   laser surgery for glaucoma Left 09/21/2014   MULTIPLE TOOTH EXTRACTIONS     rentinal tear     ROTATOR CUFF REPAIR     x 2 /right shoulder   SKIN CANCER EXCISION     pre-melanoma / on face   TEE WITHOUT CARDIOVERSION N/A 03/07/2016   Procedure: TRANSESOPHAGEAL ECHOCARDIOGRAM (TEE);  Surgeon: Adrian Prows, MD;  Location: [place];  Service: Cardiovascular;  Laterality: N/A;   TOE SURGERY     rt foot     There were no vitals filed for this visit.   Subjective Assessment - 10/10/21 1356     Subjective Patient reports a little more knee pain, back is feeling better, she reports that she is going to switch over to a smaller vacuum    Currently in Pain? Yes    Pain  Date Noted   Acute  sinusitis 08/14/2021   Chronic upper back pain 08/30/2020   Aortic atherosclerosis ([place]) 05/31/2020   Abnormal CT scan, small bowel 05/18/2020   Generalized abdominal pain 05/17/2020   Duodenitis 03/13/2020   Right lateral abdominal pain 03/07/2020   Insomnia 12/15/2019   Chronic low back pain 10/18/2019   Positive depression screening 10/18/2019   Osteoarthritis of right knee 08/02/2019   Diarrhea 07/03/2018   Dyspepsia 07/03/2018   S/P lumbar spinal fusion 07/31/2017   Right low back pain 03/04/2017   H/O atrial flutter 03/06/2016   Obesity 11/06/2015   Urge incontinence 01/24/2015   Estrogen deficiency 77/93/9030   Lichen planus 05/18/3006   Encounter for Medicare annual wellness exam 06/17/2013   Screening mammogram, encounter for 06/15/2012   Prediabetes 06/03/2011   HYPERTENSION, BENIGN ESSENTIAL 10/23/2010   OSTEOARTHRITIS, HANDS, BILATERAL 01/24/2010   Hypothyroidism 08/24/2008   Vitamin D deficiency 06/06/2008   Hyperlipidemia 06/06/2008   MENOPAUSAL SYNDROME 03/29/2008   Osteopenia 03/29/2008   Allergic rhinitis 01/25/2008   IBS 01/25/2008   OSTEOARTHRITIS 01/25/2008   FIBROMYALGIA 01/25/2008    Sumner Boast, PT 10/10/2021, 2:32 PM  [place]. [place], [place], [address] Phone: [phone]   Fax:  [phone]  Name: Linda Mcgrath MRN: [id number] Date of Birth: 09-14-1932  [address] [place], [place], [address] Phone: [phone]   Fax:  [phone]  Physical Therapy Treatment  Patient Details  Name: Linda Mcgrath MRN: [id number] Date of Birth: Feb 21, 1933 Referring Provider (PT): [name]   Encounter Date: 10/10/2021   PT End of Session - 10/10/21 1425     Visit Number 17    Date for PT Re-Evaluation 10/24/21    Authorization Type Medicare    PT Start Time 1350    PT Stop Time 1440    PT Time Calculation (min) 50 min    Activity Tolerance Patient tolerated treatment well    Behavior During Therapy [facility] for tasks assessed/performed             Past Medical History:  Diagnosis Date   Allergic rhinitis    Arthritis    Asthma    Cataract    Bil/lens implant   Colon polyp 1999   small polyp   Diverticulosis    Dysrhythmia    Fibromyalgia    Hyperlipidemia    Hypothyroidism    Internal hemorrhoids    Leg cramps    Lichen planus    Lung nodule    stable LUL 9 mm   Menopausal syndrome    Osteoarthritis    UTI (urinary tract infection)     Past Surgical History:  Procedure Laterality Date   Abd U/S  11/1998   negative   Abd U/S  01/2001   gallbladder polyps   ABDOMINAL HYSTERECTOMY  1975   total-fibroids   APPENDECTOMY  1975   BREAST BIOPSY     benign   BREAST EXCISIONAL BIOPSY Left 1957   CARDIOVERSION N/A 03/07/2016   Procedure: CARDIOVERSION;  Surgeon: Adrian Prows, MD;  Location: [place];  Service: Cardiovascular;  Laterality: N/A;   CATARACT EXTRACTION     bilateral   CHOLECYSTECTOMY     COLONOSCOPY  1998   Diverticulosis; polyp_0   COLONOSCOPY  09/2002   Diverticulosis, hem   COLONOSCOPY  12/2007   diverticulosis, polyp   DEXA  10/2001   osteopenia   ELECTROPHYSIOLOGIC STUDY N/A 04/03/2016   Procedure: A-Flutter Ablation;  Surgeon: Will Meredith Leeds, MD;  Location: [place] [facility];  Service: Cardiovascular;  Laterality: N/A;    ESOPHAGOGASTRODUODENOSCOPY  2004   Hida scan  01/2001   Negative   KNEE SURGERY     right knee / 11/2004   LAMINECTOMY WITH POSTERIOR LATERAL ARTHRODESIS LEVEL 2 N/A 07/31/2017   Procedure: DECOMPRESSIVE LAMINECTOMY LUMABR FOUR-FIVE, LUMBAR FIVE-SACRAL ONE;  Surgeon: Eustace Moore, MD;  Location: [place];  Service: Neurosurgery;  Laterality: N/A;   laser surgery for glaucoma Left 09/21/2014   MULTIPLE TOOTH EXTRACTIONS     rentinal tear     ROTATOR CUFF REPAIR     x 2 /right shoulder   SKIN CANCER EXCISION     pre-melanoma / on face   TEE WITHOUT CARDIOVERSION N/A 03/07/2016   Procedure: TRANSESOPHAGEAL ECHOCARDIOGRAM (TEE);  Surgeon: Adrian Prows, MD;  Location: [place];  Service: Cardiovascular;  Laterality: N/A;   TOE SURGERY     rt foot     There were no vitals filed for this visit.   Subjective Assessment - 10/10/21 1356     Subjective Patient reports a little more knee pain, back is feeling better, she reports that she is going to switch over to a smaller vacuum    Currently in Pain? Yes    Pain

## 2021-10-16 ENCOUNTER — Encounter: Payer: Self-pay | Admitting: Physical Therapy

## 2021-10-16 ENCOUNTER — Ambulatory Visit: Payer: Medicare Other | Admitting: Physical Therapy

## 2021-10-16 ENCOUNTER — Other Ambulatory Visit: Payer: Self-pay

## 2021-10-16 DIAGNOSIS — M545 Low back pain, unspecified: Secondary | ICD-10-CM

## 2021-10-16 DIAGNOSIS — M62830 Muscle spasm of back: Secondary | ICD-10-CM

## 2021-10-16 DIAGNOSIS — G8929 Other chronic pain: Secondary | ICD-10-CM | POA: Diagnosis not present

## 2021-10-16 DIAGNOSIS — M542 Cervicalgia: Secondary | ICD-10-CM

## 2021-10-16 DIAGNOSIS — M25561 Pain in right knee: Secondary | ICD-10-CM | POA: Diagnosis not present

## 2021-10-16 DIAGNOSIS — R262 Difficulty in walking, not elsewhere classified: Secondary | ICD-10-CM

## 2021-10-16 NOTE — Therapy (Signed)
10/18/2019   Osteoarthritis of right knee 08/02/2019   Diarrhea 07/03/2018   Dyspepsia 07/03/2018   S/P lumbar spinal fusion 07/31/2017   Right low back pain 03/04/2017   H/O atrial flutter 03/06/2016   Obesity 11/06/2015   Urge incontinence 01/24/2015   Estrogen deficiency 93/73/4287   Lichen planus 68/06/5725   Encounter for Medicare annual wellness exam 06/17/2013   Screening mammogram, encounter for 06/15/2012   Prediabetes 06/03/2011   HYPERTENSION, BENIGN ESSENTIAL 10/23/2010   OSTEOARTHRITIS, HANDS, BILATERAL 01/24/2010   Hypothyroidism 08/24/2008   Vitamin D deficiency 06/06/2008   Hyperlipidemia 06/06/2008   MENOPAUSAL SYNDROME 03/29/2008   Osteopenia 03/29/2008   Allergic rhinitis 01/25/2008   IBS 01/25/2008   OSTEOARTHRITIS 01/25/2008   FIBROMYALGIA 01/25/2008    Sumner Boast, PT 10/16/2021, 1:39 PM  [address] [place], [place], [address] Phone: [phone]   Fax:  [phone]  Name: Linda Mcgrath MRN: [id number] Date of Birth: July 28, 1933  10/18/2019   Osteoarthritis of right knee 08/02/2019   Diarrhea 07/03/2018   Dyspepsia 07/03/2018   S/P lumbar spinal fusion 07/31/2017   Right low back pain 03/04/2017   H/O atrial flutter 03/06/2016   Obesity 11/06/2015   Urge incontinence 01/24/2015   Estrogen deficiency 93/73/4287   Lichen planus 68/06/5725   Encounter for Medicare annual wellness exam 06/17/2013   Screening mammogram, encounter for 06/15/2012   Prediabetes 06/03/2011   HYPERTENSION, BENIGN ESSENTIAL 10/23/2010   OSTEOARTHRITIS, HANDS, BILATERAL 01/24/2010   Hypothyroidism 08/24/2008   Vitamin D deficiency 06/06/2008   Hyperlipidemia 06/06/2008   MENOPAUSAL SYNDROME 03/29/2008   Osteopenia 03/29/2008   Allergic rhinitis 01/25/2008   IBS 01/25/2008   OSTEOARTHRITIS 01/25/2008   FIBROMYALGIA 01/25/2008    Sumner Boast, PT 10/16/2021, 1:39 PM  [place] [place], [place], [address] Phone: [phone]   Fax:  [phone]  Name: Linda Mcgrath MRN: [id number] Date of Birth: July 28, 1933  [address] [place], [place], [address] Phone: [phone]   Fax:  [phone]  Physical Therapy Treatment  Patient Details  Name: Linda Mcgrath MRN: [id number] Date of Birth: 1933/08/05 Referring Provider (PT): [name]   Encounter Date: 10/16/2021   PT End of Session - 10/16/21 1336     Visit Number 18    Date for PT Re-Evaluation 10/24/21    Authorization Type Medicare    PT Start Time 1258    PT Stop Time 1346    PT Time Calculation (min) 48 min    Activity Tolerance Patient tolerated treatment well    Behavior During Therapy [facility] for tasks assessed/performed             Past Medical History:  Diagnosis Date   Allergic rhinitis    Arthritis    Asthma    Cataract    Bil/lens implant   Colon polyp 1999   small polyp   Diverticulosis    Dysrhythmia    Fibromyalgia    Hyperlipidemia    Hypothyroidism    Internal hemorrhoids    Leg cramps    Lichen planus    Lung nodule    stable LUL 9 mm   Menopausal syndrome    Osteoarthritis    UTI (urinary tract infection)     Past Surgical History:  Procedure Laterality Date   Abd U/S  11/1998   negative   Abd U/S  01/2001   gallbladder polyps   ABDOMINAL HYSTERECTOMY  1975   total-fibroids   APPENDECTOMY  1975   BREAST BIOPSY     benign   BREAST EXCISIONAL BIOPSY Left 1957   CARDIOVERSION N/A 03/07/2016   Procedure: CARDIOVERSION;  Surgeon: Adrian Prows, MD;  Location: [place];  Service: Cardiovascular;  Laterality: N/A;   CATARACT EXTRACTION     bilateral   CHOLECYSTECTOMY     COLONOSCOPY  1998   Diverticulosis; polyp\   COLONOSCOPY  09/2002   Diverticulosis, hem   COLONOSCOPY  12/2007   diverticulosis, polyp   DEXA  10/2001   osteopenia   ELECTROPHYSIOLOGIC STUDY N/A 04/03/2016   Procedure: A-Flutter Ablation;  Surgeon: Will Meredith Leeds, MD;  Location: [place] CV [facility];  Service: Cardiovascular;  Laterality: N/A;    ESOPHAGOGASTRODUODENOSCOPY  2004   Hida scan  01/2001   Negative   KNEE SURGERY     right knee / 11/2004   LAMINECTOMY WITH POSTERIOR LATERAL ARTHRODESIS LEVEL 2 N/A 07/31/2017   Procedure: DECOMPRESSIVE LAMINECTOMY LUMABR FOUR-FIVE, LUMBAR FIVE-SACRAL ONE;  Surgeon: Eustace Moore, MD;  Location: [place];  Service: Neurosurgery;  Laterality: N/A;   laser surgery for glaucoma Left 09/21/2014   MULTIPLE TOOTH EXTRACTIONS     rentinal tear     ROTATOR CUFF REPAIR     x 2 /right shoulder   SKIN CANCER EXCISION     pre-melanoma / on face   TEE WITHOUT CARDIOVERSION N/A 03/07/2016   Procedure: TRANSESOPHAGEAL ECHOCARDIOGRAM (TEE);  Surgeon: Adrian Prows, MD;  Location: [place];  Service: Cardiovascular;  Laterality: N/A;   TOE SURGERY     rt foot     There were no vitals filed for this visit.   Subjective Assessment - 10/16/21 1300     Subjective Patient reports that she is doing better and feeling stronger, she does come in with issues with her allergies    Currently in Pain? Yes    Pain Score 4

## 2021-10-22 ENCOUNTER — Other Ambulatory Visit: Payer: Self-pay | Admitting: Internal Medicine

## 2021-10-23 ENCOUNTER — Ambulatory Visit: Payer: Medicare Other | Admitting: Physical Therapy

## 2021-10-23 ENCOUNTER — Other Ambulatory Visit: Payer: Self-pay

## 2021-10-23 ENCOUNTER — Encounter: Payer: Self-pay | Admitting: Physical Therapy

## 2021-10-23 DIAGNOSIS — M25561 Pain in right knee: Secondary | ICD-10-CM | POA: Diagnosis not present

## 2021-10-23 DIAGNOSIS — M542 Cervicalgia: Secondary | ICD-10-CM | POA: Diagnosis not present

## 2021-10-23 DIAGNOSIS — R262 Difficulty in walking, not elsewhere classified: Secondary | ICD-10-CM | POA: Diagnosis not present

## 2021-10-23 DIAGNOSIS — M62830 Muscle spasm of back: Secondary | ICD-10-CM | POA: Diagnosis not present

## 2021-10-23 DIAGNOSIS — G8929 Other chronic pain: Secondary | ICD-10-CM | POA: Diagnosis not present

## 2021-10-23 DIAGNOSIS — M545 Low back pain, unspecified: Secondary | ICD-10-CM | POA: Diagnosis not present

## 2021-10-23 NOTE — Therapy (Signed)
[facility] [address] [place], [place], [address] Phone: [phone]   Fax:  [phone]  Physical Therapy Treatment  Patient Details  Name: Linda Mcgrath MRN: [id number] Date of Birth: Aug 27, 1932 Referring Provider (PT): [name]   Encounter Date: 10/23/2021   PT End of Session - 10/23/21 1349     Visit Number 19    Date for PT Re-Evaluation 10/24/21    Authorization Type Medicare    PT Start Time 1305    PT Stop Time 1352    PT Time Calculation (min) 47 min    Activity Tolerance Patient tolerated treatment well    Behavior During Therapy [facility] for tasks assessed/performed             Past Medical History:  Diagnosis Date   Allergic rhinitis    Arthritis    Asthma    Cataract    Bil/lens implant   Colon polyp 1999   small polyp   Diverticulosis    Dysrhythmia    Fibromyalgia    Hyperlipidemia    Hypothyroidism    Internal hemorrhoids    Leg cramps    Lichen planus    Lung nodule    stable LUL 9 mm   Menopausal syndrome    Osteoarthritis    UTI (urinary tract infection)     Past Surgical History:  Procedure Laterality Date   Abd U/S  11/1998   negative   Abd U/S  01/2001   gallbladder polyps   ABDOMINAL HYSTERECTOMY  1975   total-fibroids   APPENDECTOMY  1975   BREAST BIOPSY     benign   BREAST EXCISIONAL BIOPSY Left 1957   CARDIOVERSION N/A 03/07/2016   Procedure: CARDIOVERSION;  Surgeon: Yates Decamp, MD;  Location: [facility] ENDOSCOPY;  Service: Cardiovascular;  Laterality: N/A;   CATARACT EXTRACTION     bilateral   CHOLECYSTECTOMY     COLONOSCOPY  1998   Diverticulosis; polyp\   COLONOSCOPY  09/2002   Diverticulosis, hem   COLONOSCOPY  12/2007   diverticulosis, polyp   DEXA  10/2001   osteopenia   ELECTROPHYSIOLOGIC STUDY N/A 04/03/2016   Procedure: A-Flutter Ablation;  Surgeon: Will [name], MD;  [facility];  Service: Cardiovascular;  Laterality: N/A;    ESOPHAGOGASTRODUODENOSCOPY  2004   Hida scan  01/2001   Negative   KNEE SURGERY     right knee / 11/2004   LAMINECTOMY WITH POSTERIOR LATERAL ARTHRODESIS LEVEL 2 N/A 07/31/2017   Procedure: DECOMPRESSIVE LAMINECTOMY LUMABR FOUR-FIVE, LUMBAR FIVE-SACRAL ONE;  Surgeon: Tia Alert, MD;  Location: [facility] OR;  Service: Neurosurgery;  Laterality: N/A;   laser surgery for glaucoma Left 09/21/2014   MULTIPLE TOOTH EXTRACTIONS     rentinal tear     ROTATOR CUFF REPAIR     x 2 /right shoulder   SKIN CANCER EXCISION     pre-melanoma / on face   TEE WITHOUT CARDIOVERSION N/A 03/07/2016   Procedure: TRANSESOPHAGEAL ECHOCARDIOGRAM (TEE);  Surgeon: Yates Decamp, MD;  Location: [facility] ENDOSCOPY;  Service: Cardiovascular;  Laterality: N/A;   TOE SURGERY     rt foot     There were no vitals filed for this visit.   Subjective Assessment - 10/23/21 1318     Subjective I am still doing a little better today    Currently in Pain? Yes    Pain Score 3     Pain Location Back    Pain Orientation Right  Pain Descriptors / Indicators Sore    Pain Relieving Factors what we are doing is helping                               [facility] Adult PT Treatment/Exercise - 10/23/21 0001       Lumbar Exercises: Aerobic   Nustep level 5 x 7 minutes      Lumbar Exercises: Machines for Strengthening   Cybex Knee Flexion 15#  2x10      Lumbar Exercises: Standing   Heel Raises 20 reps;1 second    Shoulder Extension 20 reps;Theraband    Theraband Level (Shoulder Extension) Level 1 (Yellow)    Other Standing Lumbar Exercises standing hip abduction and extension with 2.5#      Lumbar Exercises: Seated   Long Arc Quad on Chair Both;10 reps;3 sets    LAQ on Chair Limitations 3#    Other Seated Lumbar Exercises marches 3# 2x10, ball b/n knees squeeze, green tband hip abduction    Other Seated Lumbar Exercises red tband ankle DF/PF, HS curls, seated lumbar side to side motions, red tband clamshells,  pelvic clocks      Manual Therapy   Manual Therapy Soft tissue mobilization    Manual therapy comments passive LE stretches    Soft tissue mobilization right upper trap and right low back                       PT Short Term Goals - 06/12/21 1343       PT SHORT TERM GOAL #1   Title independent with HEP    Status Achieved               PT Long Term Goals - 10/23/21 1352       PT LONG TERM GOAL #1   Title decrease pain 50%    Status Partially Met                   Plan - 10/23/21 1350     Clinical Impression Statement Continues to do better, walking easier, some unsteadiness at times, the more she walks the more pain she will have in the knees and some in the low back, she is able to do her housework to some extent but will cause pain, has a lot of spasms in the neck and upper trap area    PT Next Visit Plan will go to some supine exercises and see if we can help the ROM and flexibility to help her with donning and doffing socks and doing her toe nails    Consulted and Agree with Plan of Care Patient             Patient will benefit from skilled therapeutic intervention in order to improve the following deficits and impairments:  Abnormal gait, Decreased range of motion, Difficulty walking, Increased muscle spasms, Pain, Decreased activity tolerance, Decreased balance, Impaired flexibility, Improper body mechanics, Decreased mobility, Decreased strength, Postural dysfunction  Visit Diagnosis: Acute bilateral low back pain without sciatica  Difficulty in walking, not elsewhere classified  Muscle spasm of back  Cervicalgia     Problem List Patient Active Problem List   Diagnosis Date Noted   Acute sinusitis 08/14/2021   Chronic upper back pain 08/30/2020   Aortic atherosclerosis (HCC) 05/31/2020   Abnormal CT scan, small bowel 05/18/2020   Generalized abdominal pain 05/17/2020   Duodenitis 03/13/2020  Right lateral abdominal pain  03/07/2020   Insomnia 12/15/2019   Chronic low back pain 10/18/2019   Positive depression screening 10/18/2019   Osteoarthritis of right knee 08/02/2019   Diarrhea 07/03/2018   Dyspepsia 07/03/2018   S/P lumbar spinal fusion 07/31/2017   Right low back pain 03/04/2017   H/O atrial flutter 03/06/2016   Obesity 11/06/2015   Urge incontinence 01/24/2015   Estrogen deficiency 10/25/2014   Lichen planus 12/20/2013   Encounter for Medicare annual wellness exam 06/17/2013   Screening mammogram, encounter for 06/15/2012   Prediabetes 06/03/2011   HYPERTENSION, BENIGN ESSENTIAL 10/23/2010   OSTEOARTHRITIS, HANDS, BILATERAL 01/24/2010   Hypothyroidism 08/24/2008   Vitamin D deficiency 06/06/2008   Hyperlipidemia 06/06/2008   MENOPAUSAL SYNDROME 03/29/2008   Osteopenia 03/29/2008   Allergic rhinitis 01/25/2008   IBS 01/25/2008   OSTEOARTHRITIS 01/25/2008   FIBROMYALGIA 01/25/2008    Jearld Lesch, PT 10/23/2021, 1:52 PM  [facility] [address] [place], [place], [address] Phone: [phone]   Fax:  [phone]  Name: Linda Mcgrath MRN: [id number] Date of Birth: 03/01/1933

## 2021-10-30 ENCOUNTER — Encounter: Payer: Self-pay | Admitting: Physical Therapy

## 2021-10-30 ENCOUNTER — Other Ambulatory Visit: Payer: Self-pay

## 2021-10-30 ENCOUNTER — Ambulatory Visit: Payer: Medicare Other | Attending: Family Medicine | Admitting: Physical Therapy

## 2021-10-30 DIAGNOSIS — R262 Difficulty in walking, not elsewhere classified: Secondary | ICD-10-CM | POA: Diagnosis not present

## 2021-10-30 DIAGNOSIS — M545 Low back pain, unspecified: Secondary | ICD-10-CM | POA: Insufficient documentation

## 2021-10-30 DIAGNOSIS — M542 Cervicalgia: Secondary | ICD-10-CM | POA: Insufficient documentation

## 2021-10-30 DIAGNOSIS — M62830 Muscle spasm of back: Secondary | ICD-10-CM | POA: Diagnosis not present

## 2021-10-30 NOTE — Therapy (Signed)
[place] [place], [place], [address] Phone: [phone]   Fax:  [phone] Progress Note Reporting Period 08/21/21 to 10/30/21 for visits 11-20  See note below for Objective Data and Assessment of Progress/Goals.     Physical Therapy Treatment  Patient Details  Name: Linda Mcgrath MRN: [id number] Date of Birth: 12/16/1932 Referring Provider (PT): [name]   Encounter Date: 10/30/2021   PT End of Session - 10/30/21 1340     Visit Number 20    Date for PT Re-Evaluation 11/30/21    Authorization Type Medicare    PT Start Time 1300    PT Stop Time 1351    PT Time Calculation (min) 51 min    Activity Tolerance Patient tolerated treatment well    Behavior During Therapy [facility] for tasks assessed/performed             Past Medical History:  Diagnosis Date   Allergic rhinitis    Arthritis    Asthma    Cataract    Bil/lens implant   Colon polyp 1999   small polyp   Diverticulosis    Dysrhythmia    Fibromyalgia    Hyperlipidemia    Hypothyroidism    Internal hemorrhoids    Leg cramps    Lichen planus    Lung nodule    stable LUL 9 mm   Menopausal syndrome    Osteoarthritis    UTI (urinary tract infection)     Past Surgical History:  Procedure Laterality Date   Abd U/S  11/1998   negative   Abd U/S  01/2001   gallbladder polyps   ABDOMINAL HYSTERECTOMY  1975   total-fibroids   APPENDECTOMY  1975   BREAST BIOPSY     benign   BREAST EXCISIONAL BIOPSY Left 1957   CARDIOVERSION N/A 03/07/2016   Procedure: CARDIOVERSION;  Surgeon: Adrian Prows, MD;  Location: [place];  Service: Cardiovascular;  Laterality: N/A;   CATARACT EXTRACTION     bilateral   CHOLECYSTECTOMY     COLONOSCOPY  1998   Diverticulosis; polyp\   COLONOSCOPY  09/2002   Diverticulosis, hem   COLONOSCOPY  12/2007   diverticulosis, polyp   DEXA  10/2001   osteopenia   ELECTROPHYSIOLOGIC STUDY N/A 04/03/2016    Procedure: A-Flutter Ablation;  Surgeon: Will Meredith Leeds, MD;  Location: [place] [facility];  Service: Cardiovascular;  Laterality: N/A;   ESOPHAGOGASTRODUODENOSCOPY  2004   Hida scan  01/2001   Negative   KNEE SURGERY     right knee / 11/2004   LAMINECTOMY WITH POSTERIOR LATERAL ARTHRODESIS LEVEL 2 N/A 07/31/2017   Procedure: DECOMPRESSIVE LAMINECTOMY LUMABR FOUR-FIVE, LUMBAR FIVE-SACRAL ONE;  Surgeon: Eustace Moore, MD;  Location: [place];  Service: Neurosurgery;  Laterality: N/A;   laser surgery for glaucoma Left 09/21/2014   MULTIPLE TOOTH EXTRACTIONS     rentinal tear     ROTATOR CUFF REPAIR     x 2 /right shoulder   SKIN CANCER EXCISION     pre-melanoma / on face   TEE WITHOUT CARDIOVERSION N/A 03/07/2016   Procedure: TRANSESOPHAGEAL ECHOCARDIOGRAM (TEE);  Surgeon: Adrian Prows, MD;  Location: [place];  Service: Cardiovascular;  Laterality: N/A;   TOE SURGERY     rt foot     There were no vitals filed for this visit.   Subjective Assessment - 10/30/21 1308     Subjective Feeling okay, alittle stress ith my car having issues  [place]. [place], [place], [address] Phone: [phone]   Fax:  [phone] Progress Note Reporting Period 08/21/21 to 10/30/21 for visits 11-20  See note below for Objective Data and Assessment of Progress/Goals.     Physical Therapy Treatment  Patient Details  Name: Linda Mcgrath MRN: [id number] Date of Birth: 12/16/1932 Referring Provider (PT): [name]   Encounter Date: 10/30/2021   PT End of Session - 10/30/21 1340     Visit Number 20    Date for PT Re-Evaluation 11/30/21    Authorization Type Medicare    PT Start Time 1300    PT Stop Time 1351    PT Time Calculation (min) 51 min    Activity Tolerance Patient tolerated treatment well    Behavior During Therapy [facility] for tasks assessed/performed             Past Medical History:  Diagnosis Date   Allergic rhinitis    Arthritis    Asthma    Cataract    Bil/lens implant   Colon polyp 1999   small polyp   Diverticulosis    Dysrhythmia    Fibromyalgia    Hyperlipidemia    Hypothyroidism    Internal hemorrhoids    Leg cramps    Lichen planus    Lung nodule    stable LUL 9 mm   Menopausal syndrome    Osteoarthritis    UTI (urinary tract infection)     Past Surgical History:  Procedure Laterality Date   Abd U/S  11/1998   negative   Abd U/S  01/2001   gallbladder polyps   ABDOMINAL HYSTERECTOMY  1975   total-fibroids   APPENDECTOMY  1975   BREAST BIOPSY     benign   BREAST EXCISIONAL BIOPSY Left 1957   CARDIOVERSION N/A 03/07/2016   Procedure: CARDIOVERSION;  Surgeon: Adrian Prows, MD;  Location: [place];  Service: Cardiovascular;  Laterality: N/A;   CATARACT EXTRACTION     bilateral   CHOLECYSTECTOMY     COLONOSCOPY  1998   Diverticulosis; polyp\   COLONOSCOPY  09/2002   Diverticulosis, hem   COLONOSCOPY  12/2007   diverticulosis, polyp   DEXA  10/2001   osteopenia   ELECTROPHYSIOLOGIC STUDY N/A 04/03/2016    Procedure: A-Flutter Ablation;  Surgeon: Will Meredith Leeds, MD;  Location: [place] [facility];  Service: Cardiovascular;  Laterality: N/A;   ESOPHAGOGASTRODUODENOSCOPY  2004   Hida scan  01/2001   Negative   KNEE SURGERY     right knee / 11/2004   LAMINECTOMY WITH POSTERIOR LATERAL ARTHRODESIS LEVEL 2 N/A 07/31/2017   Procedure: DECOMPRESSIVE LAMINECTOMY LUMABR FOUR-FIVE, LUMBAR FIVE-SACRAL ONE;  Surgeon: Eustace Moore, MD;  Location: [place];  Service: Neurosurgery;  Laterality: N/A;   laser surgery for glaucoma Left 09/21/2014   MULTIPLE TOOTH EXTRACTIONS     rentinal tear     ROTATOR CUFF REPAIR     x 2 /right shoulder   SKIN CANCER EXCISION     pre-melanoma / on face   TEE WITHOUT CARDIOVERSION N/A 03/07/2016   Procedure: TRANSESOPHAGEAL ECHOCARDIOGRAM (TEE);  Surgeon: Adrian Prows, MD;  Location: [place];  Service: Cardiovascular;  Laterality: N/A;   TOE SURGERY     rt foot     There were no vitals filed for this visit.   Subjective Assessment - 10/30/21 1308     Subjective Feeling okay, alittle stress ith my car having issues  PT  Next Visit Plan work on overall strength and function, add balance as needed    Consulted and Agree with Plan of Care Patient             Patient will benefit from skilled therapeutic intervention in order to improve the following deficits and impairments:  Abnormal gait, Decreased range of motion, Difficulty walking, Increased muscle spasms, Pain, Decreased activity tolerance, Decreased balance, Impaired flexibility, Improper body mechanics, Decreased mobility, Decreased strength, Postural dysfunction  Visit Diagnosis: Acute bilateral low back pain without sciatica  Difficulty in walking, not elsewhere classified  Muscle spasm of back  Cervicalgia     Problem List Patient Active Problem List   Diagnosis Date Noted   Acute sinusitis 08/14/2021   Chronic upper back pain 08/30/2020   Aortic atherosclerosis ([place]) 05/31/2020   Abnormal CT scan, small bowel 05/18/2020   Generalized abdominal pain 05/17/2020   Duodenitis 03/13/2020   Right lateral abdominal pain 03/07/2020   Insomnia 12/15/2019   Chronic low back pain 10/18/2019   Positive depression screening 10/18/2019   Osteoarthritis of right knee 08/02/2019   Diarrhea 07/03/2018   Dyspepsia 07/03/2018   S/P lumbar spinal fusion 07/31/2017   Right low back pain 03/04/2017   H/O atrial flutter 03/06/2016   Obesity 11/06/2015   Urge incontinence 01/24/2015   Estrogen deficiency 56/38/7564   Lichen planus 33/29/5188   Encounter for Medicare annual wellness exam 06/17/2013   Screening mammogram, encounter for 06/15/2012   Prediabetes 06/03/2011   HYPERTENSION, BENIGN ESSENTIAL 10/23/2010   OSTEOARTHRITIS, HANDS, BILATERAL 01/24/2010   Hypothyroidism 08/24/2008   Vitamin D deficiency 06/06/2008   Hyperlipidemia 06/06/2008   MENOPAUSAL SYNDROME 03/29/2008   Osteopenia 03/29/2008   Allergic rhinitis 01/25/2008   IBS 01/25/2008   OSTEOARTHRITIS 01/25/2008   FIBROMYALGIA 01/25/2008    Sumner Boast,  PT 10/30/2021, 1:45 PM  [place] [place] [place], [place], [address] Phone: [phone]   Fax:  [phone]  Name: Linda Mcgrath MRN: [id number] Date of Birth: Mar 15, 1933

## 2021-10-31 DIAGNOSIS — J0100 Acute maxillary sinusitis, unspecified: Secondary | ICD-10-CM | POA: Diagnosis not present

## 2021-10-31 DIAGNOSIS — H15833 Staphyloma posticum, bilateral: Secondary | ICD-10-CM | POA: Diagnosis not present

## 2021-10-31 DIAGNOSIS — H5713 Ocular pain, bilateral: Secondary | ICD-10-CM | POA: Diagnosis not present

## 2021-10-31 DIAGNOSIS — H4423 Degenerative myopia, bilateral: Secondary | ICD-10-CM | POA: Diagnosis not present

## 2021-10-31 DIAGNOSIS — H35372 Puckering of macula, left eye: Secondary | ICD-10-CM | POA: Diagnosis not present

## 2021-10-31 DIAGNOSIS — H43392 Other vitreous opacities, left eye: Secondary | ICD-10-CM | POA: Diagnosis not present

## 2021-10-31 DIAGNOSIS — H43813 Vitreous degeneration, bilateral: Secondary | ICD-10-CM | POA: Diagnosis not present

## 2021-11-06 ENCOUNTER — Ambulatory Visit: Payer: Medicare Other | Admitting: Physical Therapy

## 2021-11-06 ENCOUNTER — Encounter: Payer: Self-pay | Admitting: Physical Therapy

## 2021-11-06 ENCOUNTER — Telehealth: Payer: Self-pay | Admitting: Family Medicine

## 2021-11-06 ENCOUNTER — Other Ambulatory Visit: Payer: Self-pay

## 2021-11-06 DIAGNOSIS — I10 Essential (primary) hypertension: Secondary | ICD-10-CM

## 2021-11-06 DIAGNOSIS — M545 Low back pain, unspecified: Secondary | ICD-10-CM

## 2021-11-06 DIAGNOSIS — M62830 Muscle spasm of back: Secondary | ICD-10-CM | POA: Diagnosis not present

## 2021-11-06 DIAGNOSIS — E7800 Pure hypercholesterolemia, unspecified: Secondary | ICD-10-CM

## 2021-11-06 DIAGNOSIS — R262 Difficulty in walking, not elsewhere classified: Secondary | ICD-10-CM

## 2021-11-06 DIAGNOSIS — M542 Cervicalgia: Secondary | ICD-10-CM

## 2021-11-06 DIAGNOSIS — E039 Hypothyroidism, unspecified: Secondary | ICD-10-CM

## 2021-11-06 DIAGNOSIS — R7303 Prediabetes: Secondary | ICD-10-CM

## 2021-11-06 DIAGNOSIS — E559 Vitamin D deficiency, unspecified: Secondary | ICD-10-CM

## 2021-11-06 NOTE — Therapy (Signed)
[facility] ?[address] ?[place], [place], [address] ?Phone: [phone]   Fax:  [phone] ? ?Physical Therapy Treatment ? ?Patient Details  ?Name: Linda Mcgrath ?MRN: [id number] ?Date of Birth: 1932-12-04 ?Referring Provider (PT): [name] ? ? ?Encounter Date: 11/06/2021 ? ? PT End of Session - 11/06/21 1345   ? ? Visit Number 21   ? Date for PT Re-Evaluation 11/30/21   ? Authorization Type Medicare   ? PT Start Time 3888   ? PT Stop Time 1350   ? PT Time Calculation (min) 45 min   ? Activity Tolerance Patient tolerated treatment well   ? Behavior During Therapy [facility] for tasks assessed/performed   ? ?  ?  ? ?  ? ? ?Past Medical History:  ?Diagnosis Date  ? Allergic rhinitis   ? Arthritis   ? Asthma   ? Cataract   ? Bil/lens implant  ? Colon polyp 1999  ? small polyp  ? Diverticulosis   ? Dysrhythmia   ? Fibromyalgia   ? Hyperlipidemia   ? Hypothyroidism   ? Internal hemorrhoids   ? Leg cramps   ? Lichen planus   ? Lung nodule   ? stable LUL 9 mm  ? Menopausal syndrome   ? Osteoarthritis   ? UTI (urinary tract infection)   ? ? ?Past Surgical History:  ?Procedure Laterality Date  ? Abd U/S  11/1998  ? negative  ? Abd U/S  01/2001  ? gallbladder polyps  ? ABDOMINAL HYSTERECTOMY  1975  ? total-fibroids  ? APPENDECTOMY  1975  ? BREAST BIOPSY    ? benign  ? BREAST EXCISIONAL BIOPSY Left 1957  ? CARDIOVERSION N/A 03/07/2016  ? Procedure: CARDIOVERSION;  Surgeon: Adrian Prows, MD;  Location: [facility];  Service: Cardiovascular;  Laterality: N/A;  ? CATARACT EXTRACTION    ? bilateral  ? CHOLECYSTECTOMY    ? COLONOSCOPY  1998  ? Diverticulosis; polyp\  ? COLONOSCOPY  09/2002  ? Diverticulosis, hem  ? COLONOSCOPY  12/2007  ? diverticulosis, polyp  ? DEXA  10/2001  ? osteopenia  ? ELECTROPHYSIOLOGIC STUDY N/A 04/03/2016  ? Procedure: A-Flutter Ablation;  Surgeon: Will Meredith Leeds, MD;  Location: [facility];  Service: Cardiovascular;  Laterality: N/A;  ?  ESOPHAGOGASTRODUODENOSCOPY  2004  ? Hida scan  01/2001  ? Negative  ? KNEE SURGERY    ? right knee / 11/2004  ? LAMINECTOMY WITH POSTERIOR LATERAL ARTHRODESIS LEVEL 2 N/A 07/31/2017  ? Procedure: DECOMPRESSIVE LAMINECTOMY LUMABR FOUR-FIVE, LUMBAR FIVE-SACRAL ONE;  Surgeon: Eustace Moore, MD;  Location: [facility];  Service: Neurosurgery;  Laterality: N/A;  ? laser surgery for glaucoma Left 09/21/2014  ? MULTIPLE TOOTH EXTRACTIONS    ? rentinal tear    ? ROTATOR CUFF REPAIR    ? x 2 /right shoulder  ? SKIN CANCER EXCISION    ? pre-melanoma / on face  ? TEE WITHOUT CARDIOVERSION N/A 03/07/2016  ? Procedure: TRANSESOPHAGEAL ECHOCARDIOGRAM (TEE);  Surgeon: Adrian Prows, MD;  Location: [facility];  Service: Cardiovascular;  Laterality: N/A;  ? TOE SURGERY    ? rt foot   ? ? ?There were no vitals filed for this visit. ? ? Subjective Assessment - 11/06/21 1304   ? ? Subjective Doing well, had to get my car fixed, my knees still ache, the upper back is feeling better   ? Currently in Pain? Yes   ? Pain Score 5    ?  Generalized abdominal pain 05/17/2020  ? Duodenitis 03/13/2020  ? Right lateral abdominal pain  03/07/2020  ? Insomnia 12/15/2019  ? Chronic low back pain 10/18/2019  ? Positive depression screening 10/18/2019  ? Osteoarthritis of right knee 08/02/2019  ? Diarrhea 07/03/2018  ? Dyspepsia 07/03/2018  ? S/P lumbar spinal fusion 07/31/2017  ? Right low back pain 03/04/2017  ? H/O atrial flutter 03/06/2016  ? Obesity 11/06/2015  ? Urge incontinence 01/24/2015  ? Estrogen deficiency 10/25/2014  ? Lichen planus 09/98/3382  ? Encounter for Medicare annual wellness exam 06/17/2013  ? Screening mammogram, encounter for 06/15/2012  ? Prediabetes 06/03/2011  ? HYPERTENSION, BENIGN ESSENTIAL 10/23/2010  ? OSTEOARTHRITIS, HANDS, BILATERAL 01/24/2010  ? Hypothyroidism 08/24/2008  ? Vitamin D deficiency 06/06/2008  ? Hyperlipidemia 06/06/2008  ? MENOPAUSAL SYNDROME 03/29/2008  ? Osteopenia 03/29/2008  ? Allergic rhinitis 01/25/2008  ? IBS 01/25/2008  ? OSTEOARTHRITIS 01/25/2008  ? FIBROMYALGIA 01/25/2008  ? ? Sumner Boast, PT ?11/06/2021, 1:48 PM ? ?[facility] ?[address] ?[place], [place], [address] ?Phone: [phone]   Fax:  [phone] ? ?Name: Linda Mcgrath ?MRN: [id number] ?Date of Birth: [birth year] ? ? ? ?  [facility] ?[address] ?[place], [place], [address] ?Phone: [phone]   Fax:  [phone] ? ?Physical Therapy Treatment ? ?Patient Details  ?Name: Linda Mcgrath ?MRN: [id number] ?Date of Birth: 1932-12-04 ?Referring Provider (PT): [name] ? ? ?Encounter Date: 11/06/2021 ? ? PT End of Session - 11/06/21 1345   ? ? Visit Number 21   ? Date for PT Re-Evaluation 11/30/21   ? Authorization Type Medicare   ? PT Start Time 3888   ? PT Stop Time 1350   ? PT Time Calculation (min) 45 min   ? Activity Tolerance Patient tolerated treatment well   ? Behavior During Therapy [facility] for tasks assessed/performed   ? ?  ?  ? ?  ? ? ?Past Medical History:  ?Diagnosis Date  ? Allergic rhinitis   ? Arthritis   ? Asthma   ? Cataract   ? Bil/lens implant  ? Colon polyp 1999  ? small polyp  ? Diverticulosis   ? Dysrhythmia   ? Fibromyalgia   ? Hyperlipidemia   ? Hypothyroidism   ? Internal hemorrhoids   ? Leg cramps   ? Lichen planus   ? Lung nodule   ? stable LUL 9 mm  ? Menopausal syndrome   ? Osteoarthritis   ? UTI (urinary tract infection)   ? ? ?Past Surgical History:  ?Procedure Laterality Date  ? Abd U/S  11/1998  ? negative  ? Abd U/S  01/2001  ? gallbladder polyps  ? ABDOMINAL HYSTERECTOMY  1975  ? total-fibroids  ? APPENDECTOMY  1975  ? BREAST BIOPSY    ? benign  ? BREAST EXCISIONAL BIOPSY Left 1957  ? CARDIOVERSION N/A 03/07/2016  ? Procedure: CARDIOVERSION;  Surgeon: Adrian Prows, MD;  Location: [facility];  Service: Cardiovascular;  Laterality: N/A;  ? CATARACT EXTRACTION    ? bilateral  ? CHOLECYSTECTOMY    ? COLONOSCOPY  1998  ? Diverticulosis; polyp\  ? COLONOSCOPY  09/2002  ? Diverticulosis, hem  ? COLONOSCOPY  12/2007  ? diverticulosis, polyp  ? DEXA  10/2001  ? osteopenia  ? ELECTROPHYSIOLOGIC STUDY N/A 04/03/2016  ? Procedure: A-Flutter Ablation;  Surgeon: Will Meredith Leeds, MD;  Location: [facility];  Service: Cardiovascular;  Laterality: N/A;  ?  ESOPHAGOGASTRODUODENOSCOPY  2004  ? Hida scan  01/2001  ? Negative  ? KNEE SURGERY    ? right knee / 11/2004  ? LAMINECTOMY WITH POSTERIOR LATERAL ARTHRODESIS LEVEL 2 N/A 07/31/2017  ? Procedure: DECOMPRESSIVE LAMINECTOMY LUMABR FOUR-FIVE, LUMBAR FIVE-SACRAL ONE;  Surgeon: Eustace Moore, MD;  Location: [facility];  Service: Neurosurgery;  Laterality: N/A;  ? laser surgery for glaucoma Left 09/21/2014  ? MULTIPLE TOOTH EXTRACTIONS    ? rentinal tear    ? ROTATOR CUFF REPAIR    ? x 2 /right shoulder  ? SKIN CANCER EXCISION    ? pre-melanoma / on face  ? TEE WITHOUT CARDIOVERSION N/A 03/07/2016  ? Procedure: TRANSESOPHAGEAL ECHOCARDIOGRAM (TEE);  Surgeon: Adrian Prows, MD;  Location: [facility];  Service: Cardiovascular;  Laterality: N/A;  ? TOE SURGERY    ? rt foot   ? ? ?There were no vitals filed for this visit. ? ? Subjective Assessment - 11/06/21 1304   ? ? Subjective Doing well, had to get my car fixed, my knees still ache, the upper back is feeling better   ? Currently in Pain? Yes   ? Pain Score 5    ?

## 2021-11-06 NOTE — Telephone Encounter (Signed)
-----   Message from Ellamae Sia sent at 10/30/2021  7:28 AM EST ----- ?Regarding: Lab orders for Wednesday, 3.15.23 ?Patient is scheduled for CPX labs, please order future labs, Thanks , Terri ? ? ?

## 2021-11-07 ENCOUNTER — Other Ambulatory Visit (INDEPENDENT_AMBULATORY_CARE_PROVIDER_SITE_OTHER): Payer: Medicare Other

## 2021-11-07 DIAGNOSIS — E039 Hypothyroidism, unspecified: Secondary | ICD-10-CM | POA: Diagnosis not present

## 2021-11-07 DIAGNOSIS — R7303 Prediabetes: Secondary | ICD-10-CM

## 2021-11-07 DIAGNOSIS — E7800 Pure hypercholesterolemia, unspecified: Secondary | ICD-10-CM

## 2021-11-07 DIAGNOSIS — E559 Vitamin D deficiency, unspecified: Secondary | ICD-10-CM

## 2021-11-07 DIAGNOSIS — I10 Essential (primary) hypertension: Secondary | ICD-10-CM

## 2021-11-07 LAB — COMPREHENSIVE METABOLIC PANEL
ALT: 16 U/L (ref 0–35)
AST: 20 U/L (ref 0–37)
Albumin: 4.3 g/dL (ref 3.5–5.2)
Alkaline Phosphatase: 73 U/L (ref 39–117)
BUN: 18 mg/dL (ref 6–23)
CO2: 29 mEq/L (ref 19–32)
Calcium: 9.7 mg/dL (ref 8.4–10.5)
Chloride: 104 mEq/L (ref 96–112)
Creatinine, Ser: 0.96 mg/dL (ref 0.40–1.20)
GFR: 53.54 mL/min — ABNORMAL LOW (ref >60.00)
Glucose, Bld: 85 mg/dL (ref 70–99)
Potassium: 5 mEq/L (ref 3.5–5.1)
Sodium: 139 mEq/L (ref 135–145)
Total Bilirubin: 0.7 mg/dL (ref 0.2–1.2)
Total Protein: 6.7 g/dL (ref 6.0–8.3)

## 2021-11-07 LAB — CBC WITH DIFFERENTIAL/PLATELET
Basophils Absolute: 0.1 10*3/uL (ref 0.0–0.1)
Basophils Relative: 0.9 % (ref 0.0–3.0)
Eosinophils Absolute: 0.4 10*3/uL (ref 0.0–0.7)
Eosinophils Relative: 6.2 % — ABNORMAL HIGH (ref 0.0–5.0)
HCT: 42.3 % (ref 36.0–46.0)
Hemoglobin: 14.2 g/dL (ref 12.0–15.0)
Lymphocytes Relative: 40.7 % (ref 12.0–46.0)
Lymphs Abs: 2.9 10*3/uL (ref 0.7–4.0)
MCHC: 33.5 g/dL (ref 30.0–36.0)
MCV: 98.9 fl (ref 78.0–100.0)
Monocytes Absolute: 0.6 10*3/uL (ref 0.1–1.0)
Monocytes Relative: 8.6 % (ref 3.0–12.0)
Neutro Abs: 3.1 10*3/uL (ref 1.4–7.7)
Neutrophils Relative %: 43.6 % (ref 43.0–77.0)
Platelets: 304 10*3/uL (ref 150.0–400.0)
RBC: 4.28 Mil/uL (ref 3.87–5.11)
RDW: 13 % (ref 11.5–15.5)
WBC: 7.2 10*3/uL (ref 4.0–10.5)

## 2021-11-07 LAB — LIPID PANEL
Cholesterol: 199 mg/dL (ref 0–200)
HDL: 61.3 mg/dL (ref >39.00)
LDL Cholesterol: 111 mg/dL — ABNORMAL HIGH (ref 0–99)
NonHDL: 137.27
Total CHOL/HDL Ratio: 3
Triglycerides: 129 mg/dL (ref 0.0–149.0)
VLDL: 25.8 mg/dL (ref 0.0–40.0)

## 2021-11-07 LAB — TSH: TSH: 3.05 u[IU]/mL (ref 0.35–5.50)

## 2021-11-07 LAB — HEMOGLOBIN A1C: Hgb A1c MFr Bld: 5.3 % (ref 4.6–6.5)

## 2021-11-07 LAB — VITAMIN D 25 HYDROXY (VIT D DEFICIENCY, FRACTURES): VITD: 52.68 ng/mL (ref 30.00–100.00)

## 2021-11-13 ENCOUNTER — Encounter: Payer: Self-pay | Admitting: Physical Therapy

## 2021-11-13 ENCOUNTER — Ambulatory Visit: Payer: Medicare Other | Admitting: Physical Therapy

## 2021-11-13 ENCOUNTER — Other Ambulatory Visit: Payer: Self-pay

## 2021-11-13 DIAGNOSIS — R262 Difficulty in walking, not elsewhere classified: Secondary | ICD-10-CM

## 2021-11-13 DIAGNOSIS — M62830 Muscle spasm of back: Secondary | ICD-10-CM

## 2021-11-13 DIAGNOSIS — M545 Low back pain, unspecified: Secondary | ICD-10-CM

## 2021-11-13 DIAGNOSIS — M542 Cervicalgia: Secondary | ICD-10-CM

## 2021-11-13 NOTE — Therapy (Signed)
[employer] ?[facility] ?[address] ?[place], [place], [address] ?Phone: [phone]   Fax:  [phone] ? ?Physical Therapy Treatment ? ?Patient Details  ?Name: Linda Mcgrath ?MRN: [id number] ?Date of Birth: 09/07/1932 ?Referring Provider (PT): [name] ? ? ?Encounter Date: 11/13/2021 ? ? PT End of Session - 11/13/21 1332   ? ? Visit Number 22   ? Date for PT Re-Evaluation 11/30/21   ? Authorization Type Medicare   ? PT Start Time 1300   ? PT Stop Time 1349   ? PT Time Calculation (min) 49 min   ? Activity Tolerance Patient tolerated treatment well   ? Behavior During Therapy [facility] for tasks assessed/performed   ? ?  ?  ? ?  ? ? ?Past Medical History:  ?Diagnosis Date  ? Allergic rhinitis   ? Arthritis   ? Asthma   ? Cataract   ? Bil/lens implant  ? Colon polyp 1999  ? small polyp  ? Diverticulosis   ? Dysrhythmia   ? Fibromyalgia   ? Hyperlipidemia   ? Hypothyroidism   ? Internal hemorrhoids   ? Leg cramps   ? Lichen planus   ? Lung nodule   ? stable LUL 9 mm  ? Menopausal syndrome   ? Osteoarthritis   ? UTI (urinary tract infection)   ? ? ?Past Surgical History:  ?Procedure Laterality Date  ? Abd U/S  11/1998  ? negative  ? Abd U/S  01/2001  ? gallbladder polyps  ? ABDOMINAL HYSTERECTOMY  1975  ? total-fibroids  ? APPENDECTOMY  1975  ? BREAST BIOPSY    ? benign  ? BREAST EXCISIONAL BIOPSY Left 1957  ? CARDIOVERSION N/A 03/07/2016  ? Procedure: CARDIOVERSION;  Surgeon: Yates Decamp, MD;  [facility]: [facility];  Service: Cardiovascular;  Laterality: N/A;  ? CATARACT EXTRACTION    ? bilateral  ? CHOLECYSTECTOMY    ? COLONOSCOPY  1998  ? Diverticulosis; polyp\  ? COLONOSCOPY  09/2002  ? Diverticulosis, hem  ? COLONOSCOPY  12/2007  ? diverticulosis, polyp  ? DEXA  10/2001  ? osteopenia  ? ELECTROPHYSIOLOGIC STUDY N/A 04/03/2016  ? Procedure: A-Flutter Ablation;  Surgeon: Will [name], MD;  [facility];  Service: Cardiovascular;  Laterality: N/A;  ?  ESOPHAGOGASTRODUODENOSCOPY  2004  ? Hida scan  01/2001  ? Negative  ? KNEE SURGERY    ? right knee / 11/2004  ? LAMINECTOMY WITH POSTERIOR LATERAL ARTHRODESIS LEVEL 2 N/A 07/31/2017  ? Procedure: DECOMPRESSIVE LAMINECTOMY LUMABR FOUR-FIVE, LUMBAR FIVE-SACRAL ONE;  Surgeon: Tia Alert, MD;  Location: [facility];  Service: Neurosurgery;  Laterality: N/A;  ? laser surgery for glaucoma Left 09/21/2014  ? MULTIPLE TOOTH EXTRACTIONS    ? rentinal tear    ? ROTATOR CUFF REPAIR    ? x 2 /right shoulder  ? SKIN CANCER EXCISION    ? pre-melanoma / on face  ? TEE WITHOUT CARDIOVERSION N/A 03/07/2016  ? Procedure: TRANSESOPHAGEAL ECHOCARDIOGRAM (TEE);  Surgeon: Yates Decamp, MD;  Location: [facility];  Service: Cardiovascular;  Laterality: N/A;  ? TOE SURGERY    ? rt foot   ? ? ?There were no vitals filed for this visit. ? ? Subjective Assessment - 11/13/21 1302   ? ? Subjective I am feeling a little better today.   ? Currently in Pain? Yes   ? Pain Score 4    ? Pain Location Back   ? Pain Orientation Lower   ?  Pain Descriptors / Indicators Sore   ? ?  ?  ? ?  ? ? ? ? ? ? ? ? ? ? ? ? ? ? ? ? ? ? ? ? [facility] Adult PT Treatment/Exercise - 11/13/21 0001   ? ?  ? Ambulation/Gait  ? Gait Comments gait outside around the side of the building in the grass and on uneven terrain, SPC and HHA, around the small parking [place]   ?  ? Lumbar Exercises: Aerobic  ? Nustep level 5 x 8 minutes   ?  ? Lumbar Exercises: Standing  ? Heel Raises 20 reps;1 second   ? Other Standing Lumbar Exercises standing hip abduction and extension with 2.5#   ?  ? Lumbar Exercises: Seated  ? Long [employer] on Chair Both;10 reps;3 sets   ? LAQ on Chair Limitations 3#   ? Other Seated Lumbar Exercises marches 3# 2x10, ball b/n knees squeeze, green tband hip abduction   ? Other Seated Lumbar Exercises green tband ankle DF/PF, HS curls, seated lumbar side to side motions, green tband clamshells, pelvic clocks   ?  ? Lumbar Exercises: Supine  ? Bridge 10 reps   ? ?  ?  ? ?   ? ? ? ? ? ? ? ? ? ? ? ? PT Short Term Goals - 06/12/21 1343   ? ?  ? PT SHORT TERM GOAL #1  ? Title independent with HEP   ? Status Achieved   ? ?  ?  ? ?  ? ? ? ? PT Long Term Goals - 11/13/21 1334   ? ?  ? PT LONG TERM GOAL #1  ? Title decrease pain 50%   ? Status Partially Met   ?  ? PT LONG TERM GOAL #2  ? Title increase lumbar ROM 25%   ? Status Achieved   ? ?  ?  ? ?  ? ? ? ? ? ? ? ? Plan - 11/13/21 1333   ? ? Clinical Impression Statement Patient reports that she was busy over the weekend had guests and she had to do some cleaning, reports did well, she does report tired and that she may be sore tomorrow but okay right now and she reports that she is surprised that she was able to do the cleaning without much difficulty besides fatigue   ? PT Next Visit Plan work on overall strength and function, add balance as needed   ? Consulted and Agree with Plan of Care Patient   ? ?  ?  ? ?  ? ? ?Patient will benefit from skilled therapeutic intervention in order to improve the following deficits and impairments:  Abnormal gait, Decreased range of motion, Difficulty walking, Increased muscle spasms, Pain, Decreased activity tolerance, Decreased balance, Impaired flexibility, Improper body mechanics, Decreased mobility, Decreased strength, Postural dysfunction ? ?Visit Diagnosis: ?Acute bilateral low back pain without sciatica ? ?Difficulty in walking, not elsewhere classified ? ?Muscle spasm of back ? ?Cervicalgia ? ? ? ? ?Problem List ?Patient Active Problem List  ? Diagnosis Date Noted  ? Acute sinusitis 08/14/2021  ? Chronic upper back pain 08/30/2020  ? Aortic atherosclerosis (HCC) 05/31/2020  ? Abnormal CT scan, small bowel 05/18/2020  ? Generalized abdominal pain 05/17/2020  ? Duodenitis 03/13/2020  ? Right lateral abdominal pain 03/07/2020  ? Insomnia 12/15/2019  ? Chronic low back pain 10/18/2019  ? Positive depression screening 10/18/2019  ? Osteoarthritis of right knee 08/02/2019  ? Diarrhea 07/03/2018  ?  Dyspepsia 07/03/2018  ? S/P lumbar spinal fusion 07/31/2017  ? Right low back pain 03/04/2017  ? H/O atrial flutter 03/06/2016  ? Obesity 11/06/2015  ? Urge incontinence 01/24/2015  ? Estrogen deficiency 10/25/2014  ? Lichen planus 12/20/2013  ? Encounter for Medicare annual wellness exam 06/17/2013  ? Screening mammogram, encounter for 06/15/2012  ? Prediabetes 06/03/2011  ? HYPERTENSION, BENIGN ESSENTIAL 10/23/2010  ? OSTEOARTHRITIS, HANDS, BILATERAL 01/24/2010  ? Hypothyroidism 08/24/2008  ? Vitamin D deficiency 06/06/2008  ? Hyperlipidemia 06/06/2008  ? MENOPAUSAL SYNDROME 03/29/2008  ? Osteopenia 03/29/2008  ? Allergic rhinitis 01/25/2008  ? IBS 01/25/2008  ? OSTEOARTHRITIS 01/25/2008  ? FIBROMYALGIA 01/25/2008  ? ? Jearld Lesch, PT ?11/13/2021, 1:35 PM ? ?[facility] ?[facility] ?[address] ?[place], [place], [address] ?Phone: [phone]   Fax:  [phone] ? ?Name: Linda Mcgrath ?MRN: [id number] ?Date of Birth: 14-Aug-1933 ? ? ? ?

## 2021-11-14 ENCOUNTER — Ambulatory Visit (INDEPENDENT_AMBULATORY_CARE_PROVIDER_SITE_OTHER): Payer: Medicare Other | Admitting: Family Medicine

## 2021-11-14 ENCOUNTER — Encounter: Payer: Self-pay | Admitting: Family Medicine

## 2021-11-14 VITALS — BP 136/82 | HR 80 | Temp 97.6°F | Ht 62.5 in | Wt 165.1 lb

## 2021-11-14 DIAGNOSIS — K580 Irritable bowel syndrome with diarrhea: Secondary | ICD-10-CM

## 2021-11-14 DIAGNOSIS — M79671 Pain in right foot: Secondary | ICD-10-CM | POA: Insufficient documentation

## 2021-11-14 DIAGNOSIS — R7303 Prediabetes: Secondary | ICD-10-CM | POA: Diagnosis not present

## 2021-11-14 DIAGNOSIS — E559 Vitamin D deficiency, unspecified: Secondary | ICD-10-CM | POA: Diagnosis not present

## 2021-11-14 DIAGNOSIS — Z1231 Encounter for screening mammogram for malignant neoplasm of breast: Secondary | ICD-10-CM | POA: Insufficient documentation

## 2021-11-14 DIAGNOSIS — E7800 Pure hypercholesterolemia, unspecified: Secondary | ICD-10-CM

## 2021-11-14 DIAGNOSIS — I10 Essential (primary) hypertension: Secondary | ICD-10-CM

## 2021-11-14 DIAGNOSIS — G8929 Other chronic pain: Secondary | ICD-10-CM

## 2021-11-14 DIAGNOSIS — E2839 Other primary ovarian failure: Secondary | ICD-10-CM

## 2021-11-14 DIAGNOSIS — M549 Dorsalgia, unspecified: Secondary | ICD-10-CM

## 2021-11-14 DIAGNOSIS — N3941 Urge incontinence: Secondary | ICD-10-CM | POA: Diagnosis not present

## 2021-11-14 DIAGNOSIS — M8589 Other specified disorders of bone density and structure, multiple sites: Secondary | ICD-10-CM

## 2021-11-14 DIAGNOSIS — E039 Hypothyroidism, unspecified: Secondary | ICD-10-CM

## 2021-11-14 NOTE — Assessment & Plan Note (Signed)
Intolerant of Myrbetriq in the past ?Now Vesicare is causing dry mouth ?She has stopped it ?

## 2021-11-14 NOTE — Patient Instructions (Addendum)
Labs are stable ?No change in medications  ? ?I will place a referral to podiatry for your right foot  ?If you don't hear in 1-2 weeks please give [place] a call ' ? ?Stay active  ?Eat a balanced diet  ? ?Be sure to schedule your mammogram in June  ?Also your bone density test  ? ?Please call the location of your choice from the menu below to schedule your Mammogram and/or Bone Density appointment.   ? ?[facility]  ? ?[facility]                ?      Phone:  [phone] ?[address]                               ?[place], [place] [address]                                                             ?Services: Traditional and 3D Mammogram, Bone Density  ? ?[facility]           ?      Phone: [phone] ?[address]                                                       ?[place], [place] [address]    ?Service: Bone Density ONLY  ? *this site does NOT perform mammograms ? ?[facility]                       ? Phone:  [phone] ?[address]                                  ?[place], [place] [address]                                            ?Services:  3D Mammogram and Bone Density  ? ? ?[place] ? ?[facility]   ?Phone:  [phone]   ?[place], [place] [address]                                            ?Services: 3D Mammogram and Bone Density ? ?[facility])  ?Phone:  [phone]   ?[address]                        ?[place], [place] [address]                                              ?Services:  3D Mammogram and Bone Density ? ?

## 2021-11-14 NOTE — Assessment & Plan Note (Signed)
Mammogram ordered at the breast center ?Patient will call to schedule ?No change in self breast exam or breast exam here ?

## 2021-11-14 NOTE — Assessment & Plan Note (Signed)
Continues cholestyramine for diarrhea  ?It helps  ?

## 2021-11-14 NOTE — Assessment & Plan Note (Signed)
Disc goals for lipids and reasons to control them ?Rev last labs with pt ?Rev low sat fat diet in detail ?Well-controlled with diet ?LDL of 111 and triglycerides down to 129 ?

## 2021-11-14 NOTE — Progress Notes (Signed)
? ?Subjective:  ? ? Patient ID: Linda Mcgrath, female    DOB: 1933/02/12, 88 y.o.   MRN: [id number] ? ?This visit occurred during the SARS-CoV-2 public health emergency.  Safety protocols were in place, including screening questions prior to the visit, additional usage of staff PPE, and extensive cleaning of exam room while observing appropriate contact time as indicated for disinfecting solutions.  ? ?HPI ?Pt presents for annual f/u of chronic health problems  ? ?Wt Readings from Last 3 Encounters:  ?11/14/21 165 lb 2 oz (74.9 kg)  ?09/27/21 168 lb (76.2 kg)  ?08/14/21 168 lb 2 oz (76.3 kg)  ? ?29.72 kg/m? ? ?Her sinus infection was bad for 8 weeks and made her eyes more sensitive  ?Did check in with the eye doctor  ?Pollen allergies are ramping up also  ? ?Having a foot problem  ?Surgery on it when younger /toe problem - thinks it is broken now/feels a lump  ? ?Otherwise doing ok despite arthritis  ?Still getting around  ?Knees/fingers ? ?Amw was 09/2021 reviewed  ? ?Mammogram 01/2021 ?Self breast exam : no lumps or changes  ?Will call to schedule ? ?Colon cancer screen-out aged  ? ?Dexa 2020  osteopenia  -wants to schedule at the breast center  ?Falls-not recent  ?Fractures -none ?Supplements ca and D  ?Exercise -goes to PT  ? ? ?  09/27/2021  ? 12:13 PM 09/26/2020  ? 12:03 PM 10/18/2019  ? 10:34 AM 03/25/2019  ?  9:26 AM 11/17/2017  ?  3:48 PM  ?Depression screen PHQ 2/9  ?Decreased Interest 0 0 2 0 0  ?Down, Depressed, Hopeless 0 0 3 0 0  ?PHQ - 2 Score 0 0 5 0 0  ?Altered sleeping  0 3    ?Tired, decreased energy  0 2    ?Change in appetite  0 1    ?Feeling bad or failure about yourself   0 0    ?Trouble concentrating  0 0    ?Moving slowly or fidgety/restless  0 2    ?Suicidal thoughts  0 0    ?PHQ-9 Score  0 13    ?Difficult doing work/chores  Not difficult at all Very difficult    ? ? ? ?HTN ?bp is stable today  ?No cp or palpitations or headaches or edema  ?No medications, controls with lifestyle  habits ?BP Readings from Last 3 Encounters:  ?11/14/21 136/82  ?08/14/21 134/78  ?08/11/21 (!) 161/83  ?  ?Hypothyroidism  ?Pt has no clinical changes ?No change in energy level/ hair or skin/ edema and no tremor ?Lab Results  ?Component Value Date  ? TSH 3.05 11/07/2021  ?   ?Levothyroxine 88 mcg daily  ? ?Hyperlipidemia  ?Lab Results  ?Component Value Date  ? CHOL 199 11/07/2021  ? CHOL 208 (H) 08/30/2020  ? CHOL 194 05/31/2020  ? ?Lab Results  ?Component Value Date  ? HDL 61.30 11/07/2021  ? HDL 67.70 08/30/2020  ? HDL 64.40 05/31/2020  ? ?Lab Results  ?Component Value Date  ? LDLCALC 111 (H) 11/07/2021  ? LDLCALC 107 (H) 08/30/2020  ? LDLCALC 107 (H) 05/31/2020  ? ?Lab Results  ?Component Value Date  ? TRIG 129.0 11/07/2021  ? TRIG 166.0 (H) 08/30/2020  ? TRIG 113.0 05/31/2020  ? ?Lab Results  ?Component Value Date  ? CHOLHDL 3 11/07/2021  ? CHOLHDL 3 08/30/2020  ? CHOLHDL 3 05/31/2020  ? ?Lab Results  ?Component Value Date  ? LDLDIRECT 125.7  06/09/2013  ? LDLDIRECT 124.0 11/27/2011  ? LDLDIRECT 127.3 11/27/2010  ? ?Prediabetes ?Lab Results  ?Component Value Date  ? HGBA1C 5.3 11/07/2021  ? ? ?Takes vesicare for urge incontinence  ?Quit due to dry mouth  ?Did not tolerate mybetriq either  ? ? ?Other labs ?Lab Results  ?Component Value Date  ? CREATININE 0.96 11/07/2021  ? BUN 18 11/07/2021  ? NA 139 11/07/2021  ? K 5.0 11/07/2021  ? CL 104 11/07/2021  ? CO2 29 11/07/2021  ? ?Lab Results  ?Component Value Date  ? ALT 16 11/07/2021  ? AST 20 11/07/2021  ? ALKPHOS 73 11/07/2021  ? BILITOT 0.7 11/07/2021  ? ?Lab Results  ?Component Value Date  ? WBC 7.2 11/07/2021  ? HGB 14.2 11/07/2021  ? HCT 42.3 11/07/2021  ? MCV 98.9 11/07/2021  ? PLT 304.0 11/07/2021  ?  ?Patient Active Problem List  ? Diagnosis Date Noted  ? Screening mammogram for breast cancer 11/14/2021  ? Foot pain, right 11/14/2021  ? Chronic upper back pain 08/30/2020  ? Aortic atherosclerosis ([facility]) 05/31/2020  ? Abnormal CT scan, small bowel 05/18/2020   ? Insomnia 12/15/2019  ? Chronic low back pain 10/18/2019  ? Positive depression screening 10/18/2019  ? Osteoarthritis of right knee 08/02/2019  ? S/P lumbar spinal fusion 07/31/2017  ? Right low back pain 03/04/2017  ? H/O atrial flutter 03/06/2016  ? Obesity 11/06/2015  ? Urge incontinence 01/24/2015  ? Estrogen deficiency 10/25/2014  ? Lichen planus 12/20/2013  ? Encounter for Medicare annual wellness exam 06/17/2013  ? Screening mammogram, encounter for 06/15/2012  ? Prediabetes 06/03/2011  ? HYPERTENSION, BENIGN ESSENTIAL 10/23/2010  ? OSTEOARTHRITIS, HANDS, BILATERAL 01/24/2010  ? Hypothyroidism 08/24/2008  ? Vitamin D deficiency 06/06/2008  ? Hyperlipidemia 06/06/2008  ? MENOPAUSAL SYNDROME 03/29/2008  ? Osteopenia 03/29/2008  ? Allergic rhinitis 01/25/2008  ? IBS 01/25/2008  ? OSTEOARTHRITIS 01/25/2008  ? FIBROMYALGIA 01/25/2008  ? ?Past Medical History:  ?Diagnosis Date  ? Allergic rhinitis   ? Arthritis   ? Asthma   ? Cataract   ? Bil/lens implant  ? Colon polyp 1999  ? small polyp  ? Diverticulosis   ? Dysrhythmia   ? Fibromyalgia   ? Hyperlipidemia   ? Hypothyroidism   ? Internal hemorrhoids   ? Leg cramps   ? Lichen planus   ? Lung nodule   ? stable LUL 9 mm  ? Menopausal syndrome   ? Osteoarthritis   ? UTI (urinary tract infection)   ? ?Past Surgical History:  ?Procedure Laterality Date  ? Abd U/S  11/1998  ? negative  ? Abd U/S  01/2001  ? gallbladder polyps  ? ABDOMINAL HYSTERECTOMY  1975  ? total-fibroids  ? APPENDECTOMY  1975  ? BREAST BIOPSY    ? benign  ? BREAST EXCISIONAL BIOPSY Left 1957  ? CARDIOVERSION N/A 03/07/2016  ? Procedure: CARDIOVERSION;  Surgeon: Yates Decamp, MD;  Location: [facility] ENDOSCOPY;  Service: Cardiovascular;  Laterality: N/A;  ? CATARACT EXTRACTION    ? bilateral  ? CHOLECYSTECTOMY    ? COLONOSCOPY  1998  ? Diverticulosis; polyp\  ? COLONOSCOPY  09/2002  ? Diverticulosis, hem  ? COLONOSCOPY  12/2007  ? diverticulosis, polyp  ? DEXA  10/2001  ? osteopenia  ? ELECTROPHYSIOLOGIC  STUDY N/A 04/03/2016  ? Procedure: A-Flutter Ablation;  Surgeon: Will [name], MD;  Location: [facility] INVASIVE CV LAB;  Service: Cardiovascular;  Laterality: N/A;  ? ESOPHAGOGASTRODUODENOSCOPY  2004  ? [place] scan  01/2001  ? Negative  ? KNEE SURGERY    ? right knee / 11/2004  ? LAMINECTOMY WITH POSTERIOR LATERAL ARTHRODESIS LEVEL 2 N/A 07/31/2017  ? Procedure: DECOMPRESSIVE LAMINECTOMY LUMABR FOUR-FIVE, LUMBAR FIVE-SACRAL ONE;  Surgeon: Tia Alert, MD;  Location: [facility] OR;  Service: Neurosurgery;  Laterality: N/A;  ? laser surgery for glaucoma Left 09/21/2014  ? MULTIPLE TOOTH EXTRACTIONS    ? rentinal tear    ? ROTATOR CUFF REPAIR    ? x 2 /right shoulder  ? SKIN CANCER EXCISION    ? pre-melanoma / on face  ? TEE WITHOUT CARDIOVERSION N/A 03/07/2016  ? Procedure: TRANSESOPHAGEAL ECHOCARDIOGRAM (TEE);  Surgeon: Yates Decamp, MD;  Location[facility] ENDOSCOPY;  Service: Cardiovascular;  Laterality: N/A;  ? TOE SURGERY    ? rt foot   ? ?Social History  ? ?Tobacco Use  ? Smoking status: Never  ? Smokeless tobacco: Never  ?Vaping Use  ? Vaping Use: Never used  ?Substance Use Topics  ? Alcohol use: No  ?  Alcohol/week: 0.0 standard drinks  ?  Comment: occasional-rare  ? Drug use: No  ? ?Family History  ?Problem Relation Age of Onset  ? Parkinsonism Father   ? Colon cancer Brother   ? Allergies Mother   ? Heart disease Mother   ? Kidney failure Son   ?     congenital  (kidney transplant)   ? Heart failure Son   ?     died suddenly of CHF   ? ?Allergies  ?Allergen Reactions  ? Shellfish Allergy Anaphylaxis and Nausea And Vomiting  ? Tetracycline Hives, Nausea And Vomiting, Rash and Other (See Comments)  ?  SYSTEMIC REACTION: heart palpitations,muscle spasms, vomiting  ? Amlodipine Besylate Other (See Comments)  ?  REACTION: muscle spasms, extremity swelling, low bp  ? Ciprofloxacin Other (See Comments)  ?  REACTION: muscle spasms, insomnia  ? Codeine Other (See Comments)  ?  REACTION: hallucinations  ? Fosamax [Alendronate  Sodium]   ? Propoxyphene Hcl Other (See Comments)  ?  Unknown  ? Sulfamethoxazole-Trimethoprim Nausea Only and Other (See Comments)  ?  REACTION: dizzy, could not focus eyes and could not concentrate and nausea  ? Tap

## 2021-11-14 NOTE — Assessment & Plan Note (Signed)
bp in fair control at this time  ?BP Readings from Last 1 Encounters:  ?11/14/21 136/82  ? ?No changes needed, controlled with lifestyle habits ?Most recent labs reviewed  ?Disc lifstyle change with low sodium diet and exercise  ? ?

## 2021-11-14 NOTE — Assessment & Plan Note (Signed)
Continues physical therapy which has been most helpful ?

## 2021-11-14 NOTE — Assessment & Plan Note (Signed)
Lab Results  ?Component Value Date  ? HGBA1C 5.3 11/07/2021  ? ?disc imp of low glycemic diet and wt loss to prevent DM2 ? ?

## 2021-11-14 NOTE — Assessment & Plan Note (Signed)
Hypothyroidism  ?Pt has no clinical changes ?No change in energy level/ hair or skin/ edema and no tremor ?Lab Results  ?Component Value Date  ? TSH 3.05 11/07/2021  ?  ?Plan to continue levothyroxine 88 mcg daily ?

## 2021-11-14 NOTE — Assessment & Plan Note (Signed)
Patient had a surgery years ago on her right second toe with pin ?She feels like it is getting closer to the surface of the skin and perhaps broken ?Discomfort to walk despite good shoes ?Referral done to podiatry for evaluation and treatment ?

## 2021-11-14 NOTE — Assessment & Plan Note (Signed)
Vitamin D level is therapeutic with current supplementation ?Disc importance of this to bone and overall health ?Level of 52 ?Continue currens suppl ?

## 2021-11-14 NOTE — Assessment & Plan Note (Signed)
dexa ordered ?Patient will call to schedule this at the breast center ?No recent falls or fractures ?Taking calcium and vitamin D ?Gets a fair amount of exercise at physical therapy ?

## 2021-11-27 ENCOUNTER — Encounter: Payer: Self-pay | Admitting: Physical Therapy

## 2021-11-27 ENCOUNTER — Ambulatory Visit: Payer: Medicare Other | Admitting: Podiatry

## 2021-11-27 ENCOUNTER — Ambulatory Visit: Payer: Medicare Other | Attending: Family Medicine | Admitting: Physical Therapy

## 2021-11-27 DIAGNOSIS — M545 Low back pain, unspecified: Secondary | ICD-10-CM

## 2021-11-27 DIAGNOSIS — M62830 Muscle spasm of back: Secondary | ICD-10-CM | POA: Diagnosis not present

## 2021-11-27 DIAGNOSIS — M542 Cervicalgia: Secondary | ICD-10-CM

## 2021-11-27 DIAGNOSIS — R262 Difficulty in walking, not elsewhere classified: Secondary | ICD-10-CM | POA: Diagnosis not present

## 2021-11-27 NOTE — Therapy (Signed)
[facility] ?[address] ?[place], [place], [address] ?Phone: [phone]   Fax:  [phone] ? ?Physical Therapy Treatment ? ?Patient Details  ?Name: Linda Mcgrath ?MRN: [id number] ?Date of Birth: Dec 21, 1932 ?Referring Provider (PT): [name] ? ? ?Encounter Date: 11/27/2021 ? ? PT End of Session - 11/27/21 1331   ? ? Visit Number [id number]   ? Date for PT Re-Evaluation 11/30/21   ? Authorization Type Medicare   ? PT Start Time 0867   ? PT Stop Time 1340   ? PT Time Calculation (min) 45 min   ? Activity Tolerance Patient tolerated treatment well   ? Behavior During Therapy [facility] for tasks assessed/performed   ? ?  ?  ? ?  ? ? ?Past Medical History:  ?Diagnosis Date  ? Allergic rhinitis   ? Arthritis   ? Asthma   ? Cataract   ? Bil/lens implant  ? Colon polyp 1999  ? small polyp  ? Diverticulosis   ? Dysrhythmia   ? Fibromyalgia   ? Hyperlipidemia   ? Hypothyroidism   ? Internal hemorrhoids   ? Leg cramps   ? Lichen planus   ? Lung nodule   ? stable LUL 9 mm  ? Menopausal syndrome   ? Osteoarthritis   ? UTI (urinary tract infection)   ? ? ?Past Surgical History:  ?Procedure Laterality Date  ? Abd U/S  11/1998  ? negative  ? Abd U/S  01/2001  ? gallbladder polyps  ? ABDOMINAL HYSTERECTOMY  1975  ? total-fibroids  ? APPENDECTOMY  1975  ? BREAST BIOPSY    ? benign  ? BREAST EXCISIONAL BIOPSY Left 1957  ? CARDIOVERSION N/A 03/07/2016  ? Procedure: CARDIOVERSION;  Surgeon: [place] Prows, MD;  Location: [facility];  Service: Cardiovascular;  Laterality: N/A;  ? CATARACT EXTRACTION    ? bilateral  ? CHOLECYSTECTOMY    ? COLONOSCOPY  1998  ? Diverticulosis; polyp\  ? COLONOSCOPY  09/2002  ? Diverticulosis, hem  ? COLONOSCOPY  12/2007  ? diverticulosis, polyp  ? DEXA  10/2001  ? osteopenia  ? ELECTROPHYSIOLOGIC STUDY N/A 04/03/2016  ? Procedure: A-Flutter Ablation;  Surgeon: Will Meredith Leeds, MD;  Location: [place] CV LAB;  Service: Cardiovascular;  Laterality: N/A;  ?  ESOPHAGOGASTRODUODENOSCOPY  2004  ? Hida scan  01/2001  ? Negative  ? KNEE SURGERY    ? right knee / 11/2004  ? LAMINECTOMY WITH POSTERIOR LATERAL ARTHRODESIS LEVEL 2 N/A 07/31/2017  ? Procedure: DECOMPRESSIVE LAMINECTOMY LUMABR FOUR-FIVE, LUMBAR FIVE-SACRAL ONE;  Surgeon: Eustace Moore, MD;  Location: [facility];  Service: Neurosurgery;  Laterality: N/A;  ? laser surgery for glaucoma Left 09/21/2014  ? MULTIPLE TOOTH EXTRACTIONS    ? rentinal tear    ? ROTATOR CUFF REPAIR    ? x 2 /right shoulder  ? SKIN CANCER EXCISION    ? pre-melanoma / on face  ? TEE WITHOUT CARDIOVERSION N/A 03/07/2016  ? Procedure: TRANSESOPHAGEAL ECHOCARDIOGRAM (TEE);  Surgeon: [place] Prows, MD;  Location: [place];  Service: Cardiovascular;  Laterality: N/A;  ? TOE SURGERY    ? rt foot   ? ? ?There were no vitals filed for this visit. ? ? Subjective Assessment - 11/27/21 1257   ? ? Subjective I have been doing well, yesterday started having some pain in the right hip and low back   ? Currently in Pain? Yes   ? Pain Score 5    ? Pain  pain without sciatica ? ?Difficulty in walking, not elsewhere classified ? ?Muscle spasm of back ? ?Cervicalgia ? ? ? ? ?Problem List ?Patient Active Problem List  ? Diagnosis  Date Noted  ? Screening mammogram for breast cancer 11/14/2021  ? Foot pain, right 11/14/2021  ? Chronic upper back pain 08/30/2020  ? Aortic atherosclerosis ([place]) 05/31/2020  ? Abnormal CT scan, small bowel 05/18/2020  ? Insomnia 12/15/2019  ? Chronic low back pain 10/18/2019  ? Positive depression screening 10/18/2019  ? Osteoarthritis of right knee 08/02/2019  ? S/P lumbar spinal fusion 07/31/2017  ? Right low back pain 03/04/2017  ? H/O atrial flutter 03/06/2016  ? Obesity 11/06/2015  ? Urge incontinence 01/24/2015  ? Estrogen deficiency 10/25/2014  ? Lichen planus 36/43/8377  ? Encounter for Medicare annual wellness exam 06/17/2013  ? Screening mammogram, encounter for 06/15/2012  ? Prediabetes 06/03/2011  ? HYPERTENSION, BENIGN ESSENTIAL 10/23/2010  ? OSTEOARTHRITIS, HANDS, BILATERAL 01/24/2010  ? Hypothyroidism 08/24/2008  ? Vitamin D deficiency 06/06/2008  ? Hyperlipidemia 06/06/2008  ? MENOPAUSAL SYNDROME 03/29/2008  ? Osteopenia 03/29/2008  ? Allergic rhinitis 01/25/2008  ? IBS 01/25/2008  ? OSTEOARTHRITIS 01/25/2008  ? FIBROMYALGIA 01/25/2008  ? ? Sumner Boast, PT ?11/27/2021, 1:35 PM ? ?[facility] ?[address] ?[address] ?[place], [place], [address] ?Phone: [phone]   Fax:  [phone] ? ?Name: Linda Mcgrath ?MRN: [id number] ?Date of Birth: 1933/03/09 ? ? ? ?  pain without sciatica ? ?Difficulty in walking, not elsewhere classified ? ?Muscle spasm of back ? ?Cervicalgia ? ? ? ? ?Problem List ?Patient Active Problem List  ? Diagnosis  Date Noted  ? Screening mammogram for breast cancer 11/14/2021  ? Foot pain, right 11/14/2021  ? Chronic upper back pain 08/30/2020  ? Aortic atherosclerosis ([place]) 05/31/2020  ? Abnormal CT scan, small bowel 05/18/2020  ? Insomnia 12/15/2019  ? Chronic low back pain 10/18/2019  ? Positive depression screening 10/18/2019  ? Osteoarthritis of right knee 08/02/2019  ? S/P lumbar spinal fusion 07/31/2017  ? Right low back pain 03/04/2017  ? H/O atrial flutter 03/06/2016  ? Obesity 11/06/2015  ? Urge incontinence 01/24/2015  ? Estrogen deficiency 10/25/2014  ? Lichen planus 36/43/8377  ? Encounter for Medicare annual wellness exam 06/17/2013  ? Screening mammogram, encounter for 06/15/2012  ? Prediabetes 06/03/2011  ? HYPERTENSION, BENIGN ESSENTIAL 10/23/2010  ? OSTEOARTHRITIS, HANDS, BILATERAL 01/24/2010  ? Hypothyroidism 08/24/2008  ? Vitamin D deficiency 06/06/2008  ? Hyperlipidemia 06/06/2008  ? MENOPAUSAL SYNDROME 03/29/2008  ? Osteopenia 03/29/2008  ? Allergic rhinitis 01/25/2008  ? IBS 01/25/2008  ? OSTEOARTHRITIS 01/25/2008  ? FIBROMYALGIA 01/25/2008  ? ? Sumner Boast, PT ?11/27/2021, 1:35 PM ? ?[facility] ?[address] ?[address] ?[place], [place], [address] ?Phone: [phone]   Fax:  [phone] ? ?Name: Linda Mcgrath ?MRN: [id number] ?Date of Birth: 1933/03/09 ? ? ? ?

## 2021-12-04 ENCOUNTER — Encounter: Payer: Self-pay | Admitting: Physical Therapy

## 2021-12-04 ENCOUNTER — Ambulatory Visit: Payer: Medicare Other | Admitting: Physical Therapy

## 2021-12-04 DIAGNOSIS — M545 Low back pain, unspecified: Secondary | ICD-10-CM

## 2021-12-04 DIAGNOSIS — M62830 Muscle spasm of back: Secondary | ICD-10-CM

## 2021-12-04 DIAGNOSIS — M542 Cervicalgia: Secondary | ICD-10-CM | POA: Diagnosis not present

## 2021-12-04 DIAGNOSIS — R262 Difficulty in walking, not elsewhere classified: Secondary | ICD-10-CM | POA: Diagnosis not present

## 2021-12-04 NOTE — Therapy (Signed)
[place] [address] ?[place], [place], [address] ?Phone: [phone]   Fax:  [phone] ? ?Physical Therapy Treatment ? ?Patient Details  ?Name: Linda Mcgrath ?MRN: [id number] ?Date of Birth: 08-Apr-1933 ?Referring Provider (PT): [name] ? ? ?Encounter Date: 12/04/2021 ? ? PT End of Session - 12/04/21 1349   ? ? Visit Number 24   ? Date for PT Re-Evaluation 01/03/22   ? Authorization Type Medicare   ? PT Start Time 1941   ? PT Stop Time 7408   ? PT Time Calculation (min) 91 min   ? Activity Tolerance Patient tolerated treatment well   ? Behavior During Therapy [facility] for tasks assessed/performed   ? ?  ?  ? ?  ? ? ?Past Medical History:  ?Diagnosis Date  ? Allergic rhinitis   ? Arthritis   ? Asthma   ? Cataract   ? Bil/lens implant  ? Colon polyp 1999  ? small polyp  ? Diverticulosis   ? Dysrhythmia   ? Fibromyalgia   ? Hyperlipidemia   ? Hypothyroidism   ? Internal hemorrhoids   ? Leg cramps   ? Lichen planus   ? Lung nodule   ? stable LUL 9 mm  ? Menopausal syndrome   ? Osteoarthritis   ? UTI (urinary tract infection)   ? ? ?Past Surgical History:  ?Procedure Laterality Date  ? Abd U/S  11/1998  ? negative  ? Abd U/S  01/2001  ? gallbladder polyps  ? ABDOMINAL HYSTERECTOMY  1975  ? total-fibroids  ? APPENDECTOMY  1975  ? BREAST BIOPSY    ? benign  ? BREAST EXCISIONAL BIOPSY Left 1957  ? CARDIOVERSION N/A 03/07/2016  ? Procedure: CARDIOVERSION;  Surgeon: Adrian Prows, MD;  Location: [place];  Service: Cardiovascular;  Laterality: N/A;  ? CATARACT EXTRACTION    ? bilateral  ? CHOLECYSTECTOMY    ? COLONOSCOPY  1998  ? Diverticulosis; polyp_0  ? COLONOSCOPY  09/2002  ? Diverticulosis, hem  ? COLONOSCOPY  12/2007  ? diverticulosis, polyp  ? DEXA  10/2001  ? osteopenia  ? ELECTROPHYSIOLOGIC STUDY N/A 04/03/2016  ? Procedure: A-Flutter Ablation;  Surgeon: Will Meredith Leeds, MD;  Location: [place] [facility];  Service: Cardiovascular;  Laterality: N/A;  ?  ESOPHAGOGASTRODUODENOSCOPY  2004  ? Hida scan  01/2001  ? Negative  ? KNEE SURGERY    ? right knee / 11/2004  ? LAMINECTOMY WITH POSTERIOR LATERAL ARTHRODESIS LEVEL 2 N/A 07/31/2017  ? Procedure: DECOMPRESSIVE LAMINECTOMY LUMABR FOUR-FIVE, LUMBAR FIVE-SACRAL ONE;  Surgeon: Eustace Moore, MD;  Location: [place];  Service: Neurosurgery;  Laterality: N/A;  ? laser surgery for glaucoma Left 09/21/2014  ? MULTIPLE TOOTH EXTRACTIONS    ? rentinal tear    ? ROTATOR CUFF REPAIR    ? x 2 /right shoulder  ? SKIN CANCER EXCISION    ? pre-melanoma / on face  ? TEE WITHOUT CARDIOVERSION N/A 03/07/2016  ? Procedure: TRANSESOPHAGEAL ECHOCARDIOGRAM (TEE);  Surgeon: Adrian Prows, MD;  Location: [place];  Service: Cardiovascular;  Laterality: N/A;  ? TOE SURGERY    ? rt foot   ? ? ?There were no vitals filed for this visit. ? ? Subjective Assessment - 12/04/21 1257   ? ? Subjective I am having trouble with a toe on my right foot, so I am limping some and now my back hurts alittle more   ? Currently in Pain? Yes   ? Pain Score  [place] [address] ?[place], [place], [address] ?Phone: [phone]   Fax:  [phone] ? ?Physical Therapy Treatment ? ?Patient Details  ?Name: Linda Mcgrath ?MRN: [id number] ?Date of Birth: 1933/04/13 ?Referring Provider (PT): [name] ? ? ?Encounter Date: 12/04/2021 ? ? PT End of Session - 12/04/21 1349   ? ? Visit Number 24   ? Date for PT Re-Evaluation 01/03/22   ? Authorization Type Medicare   ? PT Start Time 1941   ? PT Stop Time 7408   ? PT Time Calculation (min) 91 min   ? Activity Tolerance Patient tolerated treatment well   ? Behavior During Therapy [facility] for tasks assessed/performed   ? ?  ?  ? ?  ? ? ?Past Medical History:  ?Diagnosis Date  ? Allergic rhinitis   ? Arthritis   ? Asthma   ? Cataract   ? Bil/lens implant  ? Colon polyp 1999  ? small polyp  ? Diverticulosis   ? Dysrhythmia   ? Fibromyalgia   ? Hyperlipidemia   ? Hypothyroidism   ? Internal hemorrhoids   ? Leg cramps   ? Lichen planus   ? Lung nodule   ? stable LUL 9 mm  ? Menopausal syndrome   ? Osteoarthritis   ? UTI (urinary tract infection)   ? ? ?Past Surgical History:  ?Procedure Laterality Date  ? Abd U/S  11/1998  ? negative  ? Abd U/S  01/2001  ? gallbladder polyps  ? ABDOMINAL HYSTERECTOMY  1975  ? total-fibroids  ? APPENDECTOMY  1975  ? BREAST BIOPSY    ? benign  ? BREAST EXCISIONAL BIOPSY Left 1957  ? CARDIOVERSION N/A 03/07/2016  ? Procedure: CARDIOVERSION;  Surgeon: Adrian Prows, MD;  Location: [place];  Service: Cardiovascular;  Laterality: N/A;  ? CATARACT EXTRACTION    ? bilateral  ? CHOLECYSTECTOMY    ? COLONOSCOPY  1998  ? Diverticulosis; polyp_0  ? COLONOSCOPY  09/2002  ? Diverticulosis, hem  ? COLONOSCOPY  12/2007  ? diverticulosis, polyp  ? DEXA  10/2001  ? osteopenia  ? ELECTROPHYSIOLOGIC STUDY N/A 04/03/2016  ? Procedure: A-Flutter Ablation;  Surgeon: Will Meredith Leeds, MD;  Location: [facility];  Service: Cardiovascular;  Laterality: N/A;  ?  ESOPHAGOGASTRODUODENOSCOPY  2004  ? Hida scan  01/2001  ? Negative  ? KNEE SURGERY    ? right knee / 11/2004  ? LAMINECTOMY WITH POSTERIOR LATERAL ARTHRODESIS LEVEL 2 N/A 07/31/2017  ? Procedure: DECOMPRESSIVE LAMINECTOMY LUMABR FOUR-FIVE, LUMBAR FIVE-SACRAL ONE;  Surgeon: Eustace Moore, MD;  Location: [place];  Service: Neurosurgery;  Laterality: N/A;  ? laser surgery for glaucoma Left 09/21/2014  ? MULTIPLE TOOTH EXTRACTIONS    ? rentinal tear    ? ROTATOR CUFF REPAIR    ? x 2 /right shoulder  ? SKIN CANCER EXCISION    ? pre-melanoma / on face  ? TEE WITHOUT CARDIOVERSION N/A 03/07/2016  ? Procedure: TRANSESOPHAGEAL ECHOCARDIOGRAM (TEE);  Surgeon: Adrian Prows, MD;  Location: [place];  Service: Cardiovascular;  Laterality: N/A;  ? TOE SURGERY    ? rt foot   ? ? ?There were no vitals filed for this visit. ? ? Subjective Assessment - 12/04/21 1257   ? ? Subjective I am having trouble with a toe on my right foot, so I am limping some and now my back hurts alittle more   ? Currently in Pain? Yes   ? Pain Score  [place] [address] ?[place], [place], [address] ?Phone: [phone]   Fax:  [phone] ? ?Physical Therapy Treatment ? ?Patient Details  ?Name: Linda Mcgrath ?MRN: [id number] ?Date of Birth: 08-Apr-1933 ?Referring Provider (PT): [name] ? ? ?Encounter Date: 12/04/2021 ? ? PT End of Session - 12/04/21 1349   ? ? Visit Number 24   ? Date for PT Re-Evaluation 01/03/22   ? Authorization Type Medicare   ? PT Start Time 1941   ? PT Stop Time 7408   ? PT Time Calculation (min) 91 min   ? Activity Tolerance Patient tolerated treatment well   ? Behavior During Therapy [facility] for tasks assessed/performed   ? ?  ?  ? ?  ? ? ?Past Medical History:  ?Diagnosis Date  ? Allergic rhinitis   ? Arthritis   ? Asthma   ? Cataract   ? Bil/lens implant  ? Colon polyp 1999  ? small polyp  ? Diverticulosis   ? Dysrhythmia   ? Fibromyalgia   ? Hyperlipidemia   ? Hypothyroidism   ? Internal hemorrhoids   ? Leg cramps   ? Lichen planus   ? Lung nodule   ? stable LUL 9 mm  ? Menopausal syndrome   ? Osteoarthritis   ? UTI (urinary tract infection)   ? ? ?Past Surgical History:  ?Procedure Laterality Date  ? Abd U/S  11/1998  ? negative  ? Abd U/S  01/2001  ? gallbladder polyps  ? ABDOMINAL HYSTERECTOMY  1975  ? total-fibroids  ? APPENDECTOMY  1975  ? BREAST BIOPSY    ? benign  ? BREAST EXCISIONAL BIOPSY Left 1957  ? CARDIOVERSION N/A 03/07/2016  ? Procedure: CARDIOVERSION;  Surgeon: Adrian Prows, MD;  Location: [facility];  Service: Cardiovascular;  Laterality: N/A;  ? CATARACT EXTRACTION    ? bilateral  ? CHOLECYSTECTOMY    ? COLONOSCOPY  1998  ? Diverticulosis; polyp_0  ? COLONOSCOPY  09/2002  ? Diverticulosis, hem  ? COLONOSCOPY  12/2007  ? diverticulosis, polyp  ? DEXA  10/2001  ? osteopenia  ? ELECTROPHYSIOLOGIC STUDY N/A 04/03/2016  ? Procedure: A-Flutter Ablation;  Surgeon: Will Meredith Leeds, MD;  Location: [facility];  Service: Cardiovascular;  Laterality: N/A;  ?  ESOPHAGOGASTRODUODENOSCOPY  2004  ? Hida scan  01/2001  ? Negative  ? KNEE SURGERY    ? right knee / 11/2004  ? LAMINECTOMY WITH POSTERIOR LATERAL ARTHRODESIS LEVEL 2 N/A 07/31/2017  ? Procedure: DECOMPRESSIVE LAMINECTOMY LUMABR FOUR-FIVE, LUMBAR FIVE-SACRAL ONE;  Surgeon: Eustace Moore, MD;  Location: [facility];  Service: Neurosurgery;  Laterality: N/A;  ? laser surgery for glaucoma Left 09/21/2014  ? MULTIPLE TOOTH EXTRACTIONS    ? rentinal tear    ? ROTATOR CUFF REPAIR    ? x 2 /right shoulder  ? SKIN CANCER EXCISION    ? pre-melanoma / on face  ? TEE WITHOUT CARDIOVERSION N/A 03/07/2016  ? Procedure: TRANSESOPHAGEAL ECHOCARDIOGRAM (TEE);  Surgeon: Adrian Prows, MD;  Location: [place];  Service: Cardiovascular;  Laterality: N/A;  ? TOE SURGERY    ? rt foot   ? ? ?There were no vitals filed for this visit. ? ? Subjective Assessment - 12/04/21 1257   ? ? Subjective I am having trouble with a toe on my right foot, so I am limping some and now my back hurts alittle more   ? Currently in Pain? Yes   ? Pain Score

## 2021-12-11 ENCOUNTER — Ambulatory Visit: Payer: Medicare Other | Admitting: Physical Therapy

## 2021-12-11 ENCOUNTER — Encounter: Payer: Self-pay | Admitting: Physical Therapy

## 2021-12-11 DIAGNOSIS — M62830 Muscle spasm of back: Secondary | ICD-10-CM

## 2021-12-11 DIAGNOSIS — R262 Difficulty in walking, not elsewhere classified: Secondary | ICD-10-CM | POA: Diagnosis not present

## 2021-12-11 DIAGNOSIS — M542 Cervicalgia: Secondary | ICD-10-CM | POA: Diagnosis not present

## 2021-12-11 DIAGNOSIS — M545 Low back pain, unspecified: Secondary | ICD-10-CM

## 2021-12-11 NOTE — Therapy (Signed)
[place] ?[facility] ?[address] ?[place], [place], [address] ?Phone: [phone]   Fax:  [phone] ? ?Physical Therapy Treatment ? ?Patient Details  ?Name: Linda Mcgrath ?MRN: [id number] ?Date of Birth: [birth year] ?Referring Provider (PT): [name] ? ? ?Encounter Date: 12/11/2021 ? ? PT End of Session - 12/11/21 1343   ? ? Visit Number 25   ? Date for PT Re-Evaluation 01/03/22   ? Authorization Type Medicare   ? PT Start Time 1300   ? PT Stop Time 1348   ? PT Time Calculation (min) 48 min   ? Activity Tolerance Patient tolerated treatment well   ? Behavior During Therapy [facility] for tasks assessed/performed   ? ?  ?  ? ?  ? ? ?Past Medical History:  ?Diagnosis Date  ? Allergic rhinitis   ? Arthritis   ? Asthma   ? Cataract   ? Bil/lens implant  ? Colon polyp 1999  ? small polyp  ? Diverticulosis   ? Dysrhythmia   ? Fibromyalgia   ? Hyperlipidemia   ? Hypothyroidism   ? Internal hemorrhoids   ? Leg cramps   ? Lichen planus   ? Lung nodule   ? stable LUL 9 mm  ? Menopausal syndrome   ? Osteoarthritis   ? UTI (urinary tract infection)   ? ? ?Past Surgical History:  ?Procedure Laterality Date  ? Abd U/S  11/1998  ? negative  ? Abd U/S  01/2001  ? gallbladder polyps  ? ABDOMINAL HYSTERECTOMY  1975  ? total-fibroids  ? APPENDECTOMY  1975  ? BREAST BIOPSY    ? benign  ? BREAST EXCISIONAL BIOPSY Left 1957  ? CARDIOVERSION N/A 03/07/2016  ? Procedure: CARDIOVERSION;  Surgeon: Yates Decamp, MD;  Location: [facility];  Service: Cardiovascular;  Laterality: N/A;  ? CATARACT EXTRACTION    ? bilateral  ? CHOLECYSTECTOMY    ? COLONOSCOPY  1998  ? Diverticulosis; polyp\  ? COLONOSCOPY  09/2002  ? Diverticulosis, hem  ? COLONOSCOPY  12/2007  ? diverticulosis, polyp  ? DEXA  10/2001  ? osteopenia  ? ELECTROPHYSIOLOGIC STUDY N/A 04/03/2016  ? Procedure: A-Flutter Ablation;  Surgeon: Will [name], MD;  [facility];  Service: Cardiovascular;  Laterality: N/A;  ?  ESOPHAGOGASTRODUODENOSCOPY  2004  ? Hida scan  01/2001  ? Negative  ? KNEE SURGERY    ? right knee / 11/2004  ? LAMINECTOMY WITH POSTERIOR LATERAL ARTHRODESIS LEVEL 2 N/A 07/31/2017  ? Procedure: DECOMPRESSIVE LAMINECTOMY LUMABR FOUR-FIVE, LUMBAR FIVE-SACRAL ONE;  Surgeon: Tia Alert, MD;  Location: [facility];  Service: Neurosurgery;  Laterality: N/A;  ? laser surgery for glaucoma Left 09/21/2014  ? MULTIPLE TOOTH EXTRACTIONS    ? rentinal tear    ? ROTATOR CUFF REPAIR    ? x 2 /right shoulder  ? SKIN CANCER EXCISION    ? pre-melanoma / on face  ? TEE WITHOUT CARDIOVERSION N/A 03/07/2016  ? Procedure: TRANSESOPHAGEAL ECHOCARDIOGRAM (TEE);  Surgeon: Yates Decamp, MD;  Location: [facility]OSCOPY;  Service: Cardiovascular;  Laterality: N/A;  ? TOE SURGERY    ? rt foot   ? ? ?There were no vitals filed for this visit. ? ? Subjective Assessment - 12/11/21 1317   ? ? Subjective Reports that her allergies are really bothering her, reports that she has not been able to go out in the yard much.  Reports that she is doing well with walking to get the paper  in the morning   ? Currently in Pain? Yes   ? Pain Score 4    ? Pain Location Back   ? Pain Orientation Lower   ? ?  ?  ? ?  ? ? ? ? ? ? ? ? ? ? ? ? ? ? ? ? ? ? ? ? [facility] Adult PT Treatment/Exercise - 12/11/21 0001   ? ?  ? High Level Balance  ? High Level Balance Activities Backward walking;Side stepping   ?  ? Lumbar Exercises: Standing  ? Other Standing Lumbar Exercises standing hip abduction, marching and extension with 3#   ?  ? Lumbar Exercises: Seated  ? Long [employer] on Chair Both;10 reps;3 sets   ? LAQ on Chair Limitations 3#   ? Other Seated Lumbar Exercises marches 3# 2x10, ball b/n knees squeeze, green tband hip abduction   ? Other Seated Lumbar Exercises green tband ankle DF/PF, HS curls, seated lumbar side to side motions, , some postural activity trying to get her to go into an anterior pelvic tilt and hold for better posture   ? ?  ?  ? ?  ? ? ? ? ? ? ? ? ? ? ? ? PT  Short Term Goals - 06/12/21 1343   ? ?  ? PT SHORT TERM GOAL #1  ? Title independent with HEP   ? Status Achieved   ? ?  ?  ? ?  ? ? ? ? PT Long Term Goals - 12/11/21 1344   ? ?  ? PT LONG TERM GOAL #1  ? Title decrease pain 50%   ? Status Partially Met   ? ?  ?  ? ?  ? ? ? ? ? ? ? ? Plan - 12/11/21 1343   ? ? Clinical Impression Statement Patient unable to go outside today as she is having very bad allergies.  I continue to add exercises and walking as well as some balance to progress her to doing more at home with less pain   ? PT Next Visit Plan work on overall strength and function, add balance as needed   ? Consulted and Agree with Plan of Care Patient   ? ?  ?  ? ?  ? ? ?Patient will benefit from skilled therapeutic intervention in order to improve the following deficits and impairments:  Abnormal gait, Decreased range of motion, Difficulty walking, Increased muscle spasms, Pain, Decreased activity tolerance, Decreased balance, Impaired flexibility, Improper body mechanics, Decreased mobility, Decreased strength, Postural dysfunction ? ?Visit Diagnosis: ?Acute bilateral low back pain without sciatica ? ?Difficulty in walking, not elsewhere classified ? ?Muscle spasm of back ? ?Cervicalgia ? ? ? ? ?Problem List ?Patient Active Problem List  ? Diagnosis Date Noted  ? Screening mammogram for breast cancer 11/14/2021  ? Foot pain, right 11/14/2021  ? Chronic upper back pain 08/30/2020  ? Aortic atherosclerosis (HCC) 05/31/2020  ? Abnormal CT scan, small bowel 05/18/2020  ? Insomnia 12/15/2019  ? Chronic low back pain 10/18/2019  ? Positive depression screening 10/18/2019  ? Osteoarthritis of right knee 08/02/2019  ? S/P lumbar spinal fusion 07/31/2017  ? Right low back pain 03/04/2017  ? H/O atrial flutter 03/06/2016  ? Obesity 11/06/2015  ? Urge incontinence 01/24/2015  ? Estrogen deficiency 10/25/2014  ? Lichen planus 12/20/2013  ? Encounter for Medicare annual wellness exam 06/17/2013  ? Screening mammogram,  encounter for 06/15/2012  ? Prediabetes 06/03/2011  ? HYPERTENSION, BENIGN ESSENTIAL 10/23/2010  ?  OSTEOARTHRITIS, HANDS, BILATERAL 01/24/2010  ? Hypothyroidism 08/24/2008  ? Vitamin D deficiency 06/06/2008  ? Hyperlipidemia 06/06/2008  ? MENOPAUSAL SYNDROME 03/29/2008  ? Osteopenia 03/29/2008  ? Allergic rhinitis 01/25/2008  ? IBS 01/25/2008  ? OSTEOARTHRITIS 01/25/2008  ? FIBROMYALGIA 01/25/2008  ? ? Jearld Lesch, PT ?12/11/2021, 1:45 PM ? ?[place] ?[facility] ?[address] ?[place], [place], [address] ?Phone: [phone]   Fax:  [phone] ? ?Name: Linda Mcgrath ?MRN: [id number] ?Date of Birth: 08/10/1933 ? ? ? ?

## 2021-12-13 ENCOUNTER — Encounter (INDEPENDENT_AMBULATORY_CARE_PROVIDER_SITE_OTHER): Payer: Self-pay | Admitting: Podiatry

## 2021-12-13 DIAGNOSIS — M778 Other enthesopathies, not elsewhere classified: Secondary | ICD-10-CM

## 2021-12-13 NOTE — Progress Notes (Signed)
NO SHOW. This encounter was created in error - please disregard.

## 2021-12-18 ENCOUNTER — Encounter: Payer: Self-pay | Admitting: Physical Therapy

## 2021-12-18 ENCOUNTER — Ambulatory Visit: Payer: Medicare Other | Admitting: Physical Therapy

## 2021-12-18 DIAGNOSIS — M62830 Muscle spasm of back: Secondary | ICD-10-CM | POA: Diagnosis not present

## 2021-12-18 DIAGNOSIS — M545 Low back pain, unspecified: Secondary | ICD-10-CM

## 2021-12-18 DIAGNOSIS — M542 Cervicalgia: Secondary | ICD-10-CM

## 2021-12-18 DIAGNOSIS — R262 Difficulty in walking, not elsewhere classified: Secondary | ICD-10-CM | POA: Diagnosis not present

## 2021-12-18 NOTE — Therapy (Signed)
[place] ?[facility] ?[address] ?[place], [place], [address] ?Phone: [phone]   Fax:  [phone] ? ?Physical Therapy Treatment ? ?Patient Details  ?Name: Linda Mcgrath ?MRN: [id number] ?Date of Birth: [birth year] ?Referring Provider (PT): [name] ? ? ?Encounter Date: 12/18/2021 ? ? PT End of Session - 12/18/21 1341   ? ? Visit Number 26   ? Date for PT Re-Evaluation 01/03/22   ? Authorization Type Medicare   ? PT Start Time 1305   ? PT Stop Time 1355   ? PT Time Calculation (min) 50 min   ? Activity Tolerance Patient tolerated treatment well   ? Behavior During Therapy [facility] for tasks assessed/performed   ? ?  ?  ? ?  ? ? ?Past Medical History:  ?Diagnosis Date  ? Allergic rhinitis   ? Arthritis   ? Asthma   ? Cataract   ? Bil/lens implant  ? Colon polyp 1999  ? small polyp  ? Diverticulosis   ? Dysrhythmia   ? Fibromyalgia   ? Hyperlipidemia   ? Hypothyroidism   ? Internal hemorrhoids   ? Leg cramps   ? Lichen planus   ? Lung nodule   ? stable LUL 9 mm  ? Menopausal syndrome   ? Osteoarthritis   ? UTI (urinary tract infection)   ? ? ?Past Surgical History:  ?Procedure Laterality Date  ? Abd U/S  11/1998  ? negative  ? Abd U/S  01/2001  ? gallbladder polyps  ? ABDOMINAL HYSTERECTOMY  1975  ? total-fibroids  ? APPENDECTOMY  1975  ? BREAST BIOPSY    ? benign  ? BREAST EXCISIONAL BIOPSY Left 1957  ? CARDIOVERSION N/A 03/07/2016  ? Procedure: CARDIOVERSION;  Surgeon: Yates Decamp, MD;  [facility] END[facility]COPY;  Service: Cardiovascular;  Laterality: N/A;  ? CATARACT EXTRACTION    ? bilateral  ? CHOLECYSTECTOMY    ? COLONOSCOPY  1998  ? Diverticulosis; polyp\  ? COLONOSCOPY  09/2002  ? Diverticulosis, hem  ? COLONOSCOPY  12/2007  ? diverticulosis, polyp  ? DEXA  10/2001  ? osteopenia  ? ELECTROPHYSIOLOGIC STUDY N/A 04/03/2016  ? Procedure: A-Flutter Ablation;  Surgeon: Will [name], MD;  [facility];  Service: Cardiovascular;  Laterality: N/A;  ?  ESOPHAGOGASTRODUODENOSCOPY  2004  ? Hida scan  01/2001  ? Negative  ? KNEE SURGERY    ? right knee / 11/2004  ? LAMINECTOMY WITH POSTERIOR LATERAL ARTHRODESIS LEVEL 2 N/A 07/31/2017  ? Procedure: DECOMPRESSIVE LAMINECTOMY LUMABR FOUR-FIVE, LUMBAR FIVE-SACRAL ONE;  Surgeon: Tia Alert, MD;  Location: [facility] OR;  Service: Neurosurgery;  Laterality: N/A;  ? laser surgery for glaucoma Left 09/21/2014  ? MULTIPLE TOOTH EXTRACTIONS    ? rentinal tear    ? ROTATOR CUFF REPAIR    ? x 2 /right shoulder  ? SKIN CANCER EXCISION    ? pre-melanoma / on face  ? TEE WITHOUT CARDIOVERSION N/A 03/07/2016  ? Procedure: TRANSESOPHAGEAL ECHOCARDIOGRAM (TEE);  Surgeon: Yates Decamp, MD;  Location: [facility] ENDOSCOPY;  Service: Cardiovascular;  Laterality: N/A;  ? TOE SURGERY    ? rt foot   ? ? ?There were no vitals filed for this visit. ? ? Subjective Assessment - 12/18/21 1307   ? ? Subjective Continues to have allergy issue, she reports back is feeling good but knees hurt today   ? Currently in Pain? Yes   ? Pain Score 5    ? Pain Location Knee   ?  Pain Orientation Right;Left   ? Pain Descriptors / Indicators Aching;Sore   ? Aggravating Factors  standing and walking   ? ?  ?  ? ?  ? ? ? ? ? ? ? ? ? ? ? ? ? ? ? ? ? ? ? ? [facility] Adult PT Treatment/Exercise - 12/18/21 0001   ? ?  ? Ambulation/Gait  ? Gait Comments gait outside around the parking [place] halfway, CGA for curbs   ?  ? Lumbar Exercises: Stretches  ? Passive Hamstring Stretch Right;Left;3 reps;20 seconds   ? Gastroc Stretch Right;Left;4 reps;10 seconds   ?  ? Lumbar Exercises: Aerobic  ? Nustep level 5 x 6 minutes   ?  ? Lumbar Exercises: Standing  ? Row 20 reps;Theraband   ? Theraband Level (Row) Level 2 (Red)   ? Shoulder Extension 20 reps;Theraband   ? Theraband Level (Shoulder Extension) Level 1 (Yellow)   ? Other Standing Lumbar Exercises hip abduction and extension 3# 2x10 each   ?  ? Lumbar Exercises: Seated  ? Long [employer] on Chair Both;10 reps;3 sets   ? LAQ on Chair  Limitations 3#   ? Other Seated Lumbar Exercises marches 3# 2x10, ball b/n knees squeeze, green tband hip abduction  ball in lap isometric abs   ? Other Seated Lumbar Exercises green tband ankle DF/PF, HS curls, pelvic clocks   ? ?  ?  ? ?  ? ? ? ? ? ? ? ? ? ? ? ? PT Short Term Goals - 06/12/21 1343   ? ?  ? PT SHORT TERM GOAL #1  ? Title independent with HEP   ? Status Achieved   ? ?  ?  ? ?  ? ? ? ? PT Long Term Goals - 12/18/21 1348   ? ?  ? PT LONG TERM GOAL #1  ? Title decrease pain 50%   ? Status Partially Met   ?  ? PT LONG TERM GOAL #4  ? Title tolerate shopping for groceries without an increase in pain   ? Status Partially Met   ?  ? PT LONG TERM GOAL #5  ? Title decrease TUG time to 17 seconds   ? Status Partially Met   ? ?  ?  ? ?  ? ? ? ? ? ? ? ? Plan - 12/18/21 1342   ? ? Clinical Impression Statement Patient with a little more pain in the knees but reports that her back and neck are feeling better, she is unsure of why as she feels that she has not been doing much lately,  She has had arthritic knees in the past and has had some difficulty but seems that she is having more issues just recently   ? PT Next Visit Plan over the next period will look to d/c with a plan for her to do at home   ? Consulted and Agree with Plan of Care Patient   ? ?  ?  ? ?  ? ? ?Patient will benefit from skilled therapeutic intervention in order to improve the following deficits and impairments:  Abnormal gait, Decreased range of motion, Difficulty walking, Increased muscle spasms, Pain, Decreased activity tolerance, Decreased balance, Impaired flexibility, Improper body mechanics, Decreased mobility, Decreased strength, Postural dysfunction ? ?Visit Diagnosis: ?Acute bilateral low back pain without sciatica ? ?Difficulty in walking, not elsewhere classified ? ?Muscle spasm of back ? ?Cervicalgia ? ? ? ? ?Problem List ?Patient Active Problem List  ?  Diagnosis Date Noted  ? Screening mammogram for breast cancer 11/14/2021  ?  Foot pain, right 11/14/2021  ? Chronic upper back pain 08/30/2020  ? Aortic atherosclerosis (HCC) 05/31/2020  ? Abnormal CT scan, small bowel 05/18/2020  ? Insomnia 12/15/2019  ? Chronic low back pain 10/18/2019  ? Positive depression screening 10/18/2019  ? Osteoarthritis of right knee 08/02/2019  ? S/P lumbar spinal fusion 07/31/2017  ? Right low back pain 03/04/2017  ? H/O atrial flutter 03/06/2016  ? Obesity 11/06/2015  ? Urge incontinence 01/24/2015  ? Estrogen deficiency 10/25/2014  ? Lichen planus 12/20/2013  ? Encounter for Medicare annual wellness exam 06/17/2013  ? Screening mammogram, encounter for 06/15/2012  ? Prediabetes 06/03/2011  ? HYPERTENSION, BENIGN ESSENTIAL 10/23/2010  ? OSTEOARTHRITIS, HANDS, BILATERAL 01/24/2010  ? Hypothyroidism 08/24/2008  ? Vitamin D deficiency 06/06/2008  ? Hyperlipidemia 06/06/2008  ? MENOPAUSAL SYNDROME 03/29/2008  ? Osteopenia 03/29/2008  ? Allergic rhinitis 01/25/2008  ? IBS 01/25/2008  ? OSTEOARTHRITIS 01/25/2008  ? FIBROMYALGIA 01/25/2008  ? ? Jearld Lesch, PT ?12/18/2021, 1:49 PM ? ?[place] ?[facility] ?[address] ?[place], [place], [address] ?Phone: [phone]   Fax:  [phone] ? ?Name: Linda Mcgrath ?MRN: [id number] ?Date of Birth: 02/19/1933 ? ? ? ?

## 2021-12-25 ENCOUNTER — Encounter: Payer: Self-pay | Admitting: Physical Therapy

## 2021-12-25 ENCOUNTER — Ambulatory Visit: Payer: Medicare Other | Attending: Family Medicine | Admitting: Physical Therapy

## 2021-12-25 DIAGNOSIS — M25561 Pain in right knee: Secondary | ICD-10-CM | POA: Insufficient documentation

## 2021-12-25 DIAGNOSIS — M62830 Muscle spasm of back: Secondary | ICD-10-CM | POA: Diagnosis not present

## 2021-12-25 DIAGNOSIS — M545 Low back pain, unspecified: Secondary | ICD-10-CM

## 2021-12-25 DIAGNOSIS — M542 Cervicalgia: Secondary | ICD-10-CM | POA: Diagnosis not present

## 2021-12-25 DIAGNOSIS — R262 Difficulty in walking, not elsewhere classified: Secondary | ICD-10-CM | POA: Diagnosis not present

## 2021-12-25 DIAGNOSIS — G8929 Other chronic pain: Secondary | ICD-10-CM | POA: Diagnosis not present

## 2021-12-25 NOTE — Therapy (Signed)
[place] [facility] ?[address] ?[place], [place], [address] ?Phone: [phone]   Fax:  [phone] ? ?Physical Therapy Treatment ? ?Patient Details  ?Name: Linda Mcgrath ?MRN: [id number] ?Date of Birth: 1933/07/26 ?Referring Provider (PT): [name] ? ? ?Encounter Date: 12/25/2021 ? ? PT End of Session - 12/25/21 1326   ? ? Visit Number 27   ? Date for PT Re-Evaluation 01/03/22   ? Authorization Type Medicare   ? PT Start Time 1250   ? PT Stop Time 1336   ? PT Time Calculation (min) 46 min   ? Activity Tolerance Patient tolerated treatment well   ? Behavior During Therapy [facility] for tasks assessed/performed   ? ?  ?  ? ?  ? ? ?Past Medical History:  ?Diagnosis Date  ? Allergic rhinitis   ? Arthritis   ? Asthma   ? Cataract   ? Bil/lens implant  ? Colon polyp 1999  ? small polyp  ? Diverticulosis   ? Dysrhythmia   ? Fibromyalgia   ? Hyperlipidemia   ? Hypothyroidism   ? Internal hemorrhoids   ? Leg cramps   ? Lichen planus   ? Lung nodule   ? stable LUL 9 mm  ? Menopausal syndrome   ? Osteoarthritis   ? UTI (urinary tract infection)   ? ? ?Past Surgical History:  ?Procedure Laterality Date  ? Abd U/S  11/1998  ? negative  ? Abd U/S  01/2001  ? gallbladder polyps  ? ABDOMINAL HYSTERECTOMY  1975  ? total-fibroids  ? APPENDECTOMY  1975  ? BREAST BIOPSY    ? benign  ? BREAST EXCISIONAL BIOPSY Left 1957  ? CARDIOVERSION N/A 03/07/2016  ? Procedure: CARDIOVERSION;  Surgeon: Yates Decamp, MD;  Location: [facility] ENDOSCOPY;  Service: Cardiovascular;  Laterality: N/A;  ? CATARACT EXTRACTION    ? bilateral  ? CHOLECYSTECTOMY    ? COLONOSCOPY  1998  ? Diverticulosis; polyp\  ? COLONOSCOPY  09/2002  ? Diverticulosis, hem  ? COLONOSCOPY  12/2007  ? diverticulosis, polyp  ? DEXA  10/2001  ? osteopenia  ? ELECTROPHYSIOLOGIC STUDY N/A 04/03/2016  ? Procedure: A-Flutter Ablation;  Surgeon: Will [name], MD;  [facility] INVASIVE CV LAB;  Service: Cardiovascular;  Laterality: N/A;  ?  ESOPHAGOGASTRODUODENOSCOPY  2004  ? Hida scan  01/2001  ? Negative  ? KNEE SURGERY    ? right knee / 11/2004  ? LAMINECTOMY WITH POSTERIOR LATERAL ARTHRODESIS LEVEL 2 N/A 07/31/2017  ? Procedure: DECOMPRESSIVE LAMINECTOMY LUMABR FOUR-FIVE, LUMBAR FIVE-SACRAL ONE;  Surgeon: Tia Alert, MD;  Location: [facility] OR;  Service: Neurosurgery;  Laterality: N/A;  ? laser surgery for glaucoma Left 09/21/2014  ? MULTIPLE TOOTH EXTRACTIONS    ? rentinal tear    ? ROTATOR CUFF REPAIR    ? x 2 /right shoulder  ? SKIN CANCER EXCISION    ? pre-melanoma / on face  ? TEE WITHOUT CARDIOVERSION N/A 03/07/2016  ? Procedure: TRANSESOPHAGEAL ECHOCARDIOGRAM (TEE);  Surgeon: Yates Decamp, MD;  Location: [facility] ENDOSCOPY;  Service: Cardiovascular;  Laterality: N/A;  ? TOE SURGERY    ? rt foot   ? ? ?There were no vitals filed for this visit. ? ? Subjective Assessment - 12/25/21 1250   ? ? Subjective Reports that she is doing okay she has not been doing much   ? Currently in Pain? Yes   ? Pain Score 4    ? Pain Location Knee   ?  Pain Orientation Right;Left   ? Pain Descriptors / Indicators Sore   ? Aggravating Factors  standing, walking   ? ?  ?  ? ?  ? ? ? ? ? ? ? ? ? ? ? ? ? ? ? ? ? ? ? ? [facility] Adult PT Treatment/Exercise - 12/25/21 0001   ? ?  ? Lumbar Exercises: Aerobic  ? Nustep level 5 x 7 minutes   ?  ? Lumbar Exercises: Machines for Strengthening  ? Cybex Knee Flexion 15#  2x10   ?  ? Lumbar Exercises: Standing  ? Row 20 reps;Theraband   ? Theraband Level (Row) Level 2 (Red)   ? Other Standing Lumbar Exercises hip abduction and extension 3# 2x10 each   ?  ? Lumbar Exercises: Seated  ? Long [employer] on Chair Both;10 reps;3 sets   ? LAQ on Chair Limitations 3#   ? Other Seated Lumbar Exercises marches 3# 2x10, ball b/n knees squeeze, green tband hip abduction  ball in lap isometric abs   ? Other Seated Lumbar Exercises green tband ankle DF/PF, HS curls, clams with green tband   ? ?  ?  ? ?  ? ? ? ? ? ? ? ? ? ? ? ? PT Short Term Goals - 06/12/21  1343   ? ?  ? PT SHORT TERM GOAL #1  ? Title independent with HEP   ? Status Achieved   ? ?  ?  ? ?  ? ? ? ? PT Long Term Goals - 12/25/21 1329   ? ?  ? PT LONG TERM GOAL #1  ? Title decrease pain 50%   ? Status Partially Met   ?  ? PT LONG TERM GOAL #2  ? Title increase lumbar ROM 25%   ? Status Achieved   ?  ? PT LONG TERM GOAL #3  ? Title Understand proper posture and body mechanics for housework and yardwork tasks.    ? Status Achieved   ?  ? PT LONG TERM GOAL #4  ? Title tolerate shopping for groceries without an increase in pain   ? Status Partially Met   ?  ? PT LONG TERM GOAL #5  ? Title decrease TUG time to 17 seconds   ? Status Achieved   ? ?  ?  ? ?  ? ? ? ? ? ? ? ? Plan - 12/25/21 1327   ? ? Clinical Impression Statement Patient does report that she did some raking in the ditch over the weekend, reports very tired and a little sore.  She reports legs and back were sore.  She was very pleased that she was able to do this with a small amount of soreness and pain.   ? PT Next Visit Plan work on the D/C plan for her   ? Consulted and Agree with Plan of Care Patient   ? ?  ?  ? ?  ? ? ?Patient will benefit from skilled therapeutic intervention in order to improve the following deficits and impairments:  Abnormal gait, Decreased range of motion, Difficulty walking, Increased muscle spasms, Pain, Decreased activity tolerance, Decreased balance, Impaired flexibility, Improper body mechanics, Decreased mobility, Decreased strength, Postural dysfunction ? ?Visit Diagnosis: ?Acute bilateral low back pain without sciatica ? ?Difficulty in walking, not elsewhere classified ? ?Muscle spasm of back ? ?Cervicalgia ? ?Chronic pain of right knee ? ? ? ? ?Problem List ?Patient Active Problem List  ? Diagnosis Date Noted  ?  Screening mammogram for breast cancer 11/14/2021  ? Foot pain, right 11/14/2021  ? Chronic upper back pain 08/30/2020  ? Aortic atherosclerosis ([facility]) 05/31/2020  ? Abnormal CT scan, small bowel  05/18/2020  ? Insomnia 12/15/2019  ? Chronic low back pain 10/18/2019  ? Positive depression screening 10/18/2019  ? Osteoarthritis of right knee 08/02/2019  ? S/P lumbar spinal fusion 07/31/2017  ? Right low back pain 03/04/2017  ? H/O atrial flutter 03/06/2016  ? Obesity 11/06/2015  ? Urge incontinence 01/24/2015  ? Estrogen deficiency 10/25/2014  ? Lichen planus 12/20/2013  ? Encounter for Medicare annual wellness exam 06/17/2013  ? Screening mammogram, encounter for 06/15/2012  ? Prediabetes 06/03/2011  ? HYPERTENSION, BENIGN ESSENTIAL 10/23/2010  ? OSTEOARTHRITIS, HANDS, BILATERAL 01/24/2010  ? Hypothyroidism 08/24/2008  ? Vitamin D deficiency 06/06/2008  ? Hyperlipidemia 06/06/2008  ? MENOPAUSAL SYNDROME 03/29/2008  ? Osteopenia 03/29/2008  ? Allergic rhinitis 01/25/2008  ? IBS 01/25/2008  ? OSTEOARTHRITIS 01/25/2008  ? FIBROMYALGIA 01/25/2008  ? ? Jearld Lesch, PT ?12/25/2021, 1:30 PM ? ?[place] ?[facility] ?[address] ?[place], [place], [address] ?Phone: [phone]   Fax:  [phone] ? ?Name: Linda Mcgrath ?MRN: [id number] ?Date of Birth: Jun 08, 1933 ? ? ? ?

## 2022-01-01 ENCOUNTER — Encounter: Payer: Self-pay | Admitting: Physical Therapy

## 2022-01-01 ENCOUNTER — Ambulatory Visit: Payer: Medicare Other | Admitting: Physical Therapy

## 2022-01-01 DIAGNOSIS — R262 Difficulty in walking, not elsewhere classified: Secondary | ICD-10-CM | POA: Diagnosis not present

## 2022-01-01 DIAGNOSIS — G8929 Other chronic pain: Secondary | ICD-10-CM | POA: Diagnosis not present

## 2022-01-01 DIAGNOSIS — M545 Low back pain, unspecified: Secondary | ICD-10-CM

## 2022-01-01 DIAGNOSIS — M62830 Muscle spasm of back: Secondary | ICD-10-CM

## 2022-01-01 DIAGNOSIS — M542 Cervicalgia: Secondary | ICD-10-CM | POA: Diagnosis not present

## 2022-01-01 DIAGNOSIS — M25561 Pain in right knee: Secondary | ICD-10-CM | POA: Diagnosis not present

## 2022-01-01 NOTE — Therapy (Signed)
[place] ?[place] [address] ?[place], [place], [address] ?Phone: [phone]   Fax:  [phone] ? ?Physical Therapy Treatment ? ?Patient Details  ?Name: Linda Mcgrath ?MRN: [id number] ?Date of Birth: 02/23/1933 ?Referring Provider (PT): [name] ? ? ?Encounter Date: 01/01/2022 ? ? PT End of Session - 01/01/22 1351   ? ? Visit Number 28   ? Date for PT Re-Evaluation 01/03/22   ? Authorization Type Medicare   ? PT Start Time 1259   ? PT Stop Time 0539   ? PT Time Calculation (min) 42 min   ? Activity Tolerance Patient tolerated treatment well   ? Behavior During Therapy [facility] for tasks assessed/performed   ? ?  ?  ? ?  ? ? ?Past Medical History:  ?Diagnosis Date  ? Allergic rhinitis   ? Arthritis   ? Asthma   ? Cataract   ? Bil/lens implant  ? Colon polyp 1999  ? small polyp  ? Diverticulosis   ? Dysrhythmia   ? Fibromyalgia   ? Hyperlipidemia   ? Hypothyroidism   ? Internal hemorrhoids   ? Leg cramps   ? Lichen planus   ? Lung nodule   ? stable LUL 9 mm  ? Menopausal syndrome   ? Osteoarthritis   ? UTI (urinary tract infection)   ? ? ?Past Surgical History:  ?Procedure Laterality Date  ? Abd U/S  11/1998  ? negative  ? Abd U/S  01/2001  ? gallbladder polyps  ? ABDOMINAL HYSTERECTOMY  1975  ? total-fibroids  ? APPENDECTOMY  1975  ? BREAST BIOPSY    ? benign  ? BREAST EXCISIONAL BIOPSY Left 1957  ? CARDIOVERSION N/A 03/07/2016  ? Procedure: CARDIOVERSION;  Surgeon: Adrian Prows, MD;  Location: [facility];  Service: Cardiovascular;  Laterality: N/A;  ? CATARACT EXTRACTION    ? bilateral  ? CHOLECYSTECTOMY    ? COLONOSCOPY  1998  ? Diverticulosis; polyp\  ? COLONOSCOPY  09/2002  ? Diverticulosis, hem  ? COLONOSCOPY  12/2007  ? diverticulosis, polyp  ? DEXA  10/2001  ? osteopenia  ? ELECTROPHYSIOLOGIC STUDY N/A 04/03/2016  ? Procedure: A-Flutter Ablation;  Surgeon: Will Meredith Leeds, MD;  Location: [facility] CV LAB;  Service: Cardiovascular;  Laterality: N/A;  ?  ESOPHAGOGASTRODUODENOSCOPY  2004  ? Hida scan  01/2001  ? Negative  ? KNEE SURGERY    ? right knee / 11/2004  ? LAMINECTOMY WITH POSTERIOR LATERAL ARTHRODESIS LEVEL 2 N/A 07/31/2017  ? Procedure: DECOMPRESSIVE LAMINECTOMY LUMABR FOUR-FIVE, LUMBAR FIVE-SACRAL ONE;  Surgeon: Eustace Moore, MD;  Location: [facility];  Service: Neurosurgery;  Laterality: N/A;  ? laser surgery for glaucoma Left 09/21/2014  ? MULTIPLE TOOTH EXTRACTIONS    ? rentinal tear    ? ROTATOR CUFF REPAIR    ? x 2 /right shoulder  ? SKIN CANCER EXCISION    ? pre-melanoma / on face  ? TEE WITHOUT CARDIOVERSION N/A 03/07/2016  ? Procedure: TRANSESOPHAGEAL ECHOCARDIOGRAM (TEE);  Surgeon: Adrian Prows, MD;  Location: [facility];  Service: Cardiovascular;  Laterality: N/A;  ? TOE SURGERY    ? rt foot   ? ? ?There were no vitals filed for this visit. ? ? Subjective Assessment - 01/01/22 1303   ? ? Subjective Patient reports that she walked down a long driveway at her property with her son to look at a garage, reports that she also did some picking up of sticks, reports knees are sore but  lumbar spinal fusion 07/31/2017  ? Right low back pain 03/04/2017  ? H/O atrial flutter 03/06/2016  ? Obesity 11/06/2015  ? Urge incontinence 01/24/2015  ? Estrogen deficiency 10/25/2014  ? Lichen planus 15/86/8257  ? Encounter for Medicare annual wellness exam 06/17/2013  ? Screening mammogram, encounter for 06/15/2012  ? Prediabetes 06/03/2011  ? HYPERTENSION, BENIGN ESSENTIAL 10/23/2010  ? OSTEOARTHRITIS, HANDS, BILATERAL 01/24/2010  ? Hypothyroidism 08/24/2008  ? Vitamin D deficiency 06/06/2008  ? Hyperlipidemia 06/06/2008  ? MENOPAUSAL SYNDROME 03/29/2008  ? Osteopenia 03/29/2008  ? Allergic rhinitis 01/25/2008  ? IBS 01/25/2008  ? OSTEOARTHRITIS 01/25/2008  ? FIBROMYALGIA 01/25/2008  ? ? Sumner Boast, PT ?01/01/2022, 1:54 PM ? ?[place] ?[address] ?[place], [place], [address] ?Phone: [phone]   Fax:  [phone] ? ?Name: Linda Mcgrath ?MRN: [id number] ?Date of Birth: [birth year] ? ? ? ?  [facility] [place] ?[place] [address] ?[place], [place], [address] ?Phone: [phone]   Fax:  [phone] ? ?Physical Therapy Treatment ? ?Patient Details  ?Name: Linda Mcgrath ?MRN: [id number] ?Date of Birth: 02/23/1933 ?Referring Provider (PT): [name] ? ? ?Encounter Date: 01/01/2022 ? ? PT End of Session - 01/01/22 1351   ? ? Visit Number 28   ? Date for PT Re-Evaluation 01/03/22   ? Authorization Type Medicare   ? PT Start Time 1259   ? PT Stop Time 0539   ? PT Time Calculation (min) 42 min   ? Activity Tolerance Patient tolerated treatment well   ? Behavior During Therapy [facility] for tasks assessed/performed   ? ?  ?  ? ?  ? ? ?Past Medical History:  ?Diagnosis Date  ? Allergic rhinitis   ? Arthritis   ? Asthma   ? Cataract   ? Bil/lens implant  ? Colon polyp 1999  ? small polyp  ? Diverticulosis   ? Dysrhythmia   ? Fibromyalgia   ? Hyperlipidemia   ? Hypothyroidism   ? Internal hemorrhoids   ? Leg cramps   ? Lichen planus   ? Lung nodule   ? stable LUL 9 mm  ? Menopausal syndrome   ? Osteoarthritis   ? UTI (urinary tract infection)   ? ? ?Past Surgical History:  ?Procedure Laterality Date  ? Abd U/S  11/1998  ? negative  ? Abd U/S  01/2001  ? gallbladder polyps  ? ABDOMINAL HYSTERECTOMY  1975  ? total-fibroids  ? APPENDECTOMY  1975  ? BREAST BIOPSY    ? benign  ? BREAST EXCISIONAL BIOPSY Left 1957  ? CARDIOVERSION N/A 03/07/2016  ? Procedure: CARDIOVERSION;  Surgeon: Adrian Prows, MD;  Location: [facility];  Service: Cardiovascular;  Laterality: N/A;  ? CATARACT EXTRACTION    ? bilateral  ? CHOLECYSTECTOMY    ? COLONOSCOPY  1998  ? Diverticulosis; polyp\  ? COLONOSCOPY  09/2002  ? Diverticulosis, hem  ? COLONOSCOPY  12/2007  ? diverticulosis, polyp  ? DEXA  10/2001  ? osteopenia  ? ELECTROPHYSIOLOGIC STUDY N/A 04/03/2016  ? Procedure: A-Flutter Ablation;  Surgeon: Will Meredith Leeds, MD;  Location: [facility] CV LAB;  Service: Cardiovascular;  Laterality: N/A;  ?  ESOPHAGOGASTRODUODENOSCOPY  2004  ? Hida scan  01/2001  ? Negative  ? KNEE SURGERY    ? right knee / 11/2004  ? LAMINECTOMY WITH POSTERIOR LATERAL ARTHRODESIS LEVEL 2 N/A 07/31/2017  ? Procedure: DECOMPRESSIVE LAMINECTOMY LUMABR FOUR-FIVE, LUMBAR FIVE-SACRAL ONE;  Surgeon: Eustace Moore, MD;  Location: [facility];  Service: Neurosurgery;  Laterality: N/A;  ? laser surgery for glaucoma Left 09/21/2014  ? MULTIPLE TOOTH EXTRACTIONS    ? rentinal tear    ? ROTATOR CUFF REPAIR    ? x 2 /right shoulder  ? SKIN CANCER EXCISION    ? pre-melanoma / on face  ? TEE WITHOUT CARDIOVERSION N/A 03/07/2016  ? Procedure: TRANSESOPHAGEAL ECHOCARDIOGRAM (TEE);  Surgeon: Adrian Prows, MD;  Location: [facility];  Service: Cardiovascular;  Laterality: N/A;  ? TOE SURGERY    ? rt foot   ? ? ?There were no vitals filed for this visit. ? ? Subjective Assessment - 01/01/22 1303   ? ? Subjective Patient reports that she walked down a long driveway at her property with her son to look at a garage, reports that she also did some picking up of sticks, reports knees are sore but

## 2022-01-08 ENCOUNTER — Encounter: Payer: Self-pay | Admitting: Physical Therapy

## 2022-01-08 ENCOUNTER — Ambulatory Visit: Payer: Medicare Other | Admitting: Physical Therapy

## 2022-01-08 DIAGNOSIS — G8929 Other chronic pain: Secondary | ICD-10-CM | POA: Diagnosis not present

## 2022-01-08 DIAGNOSIS — M542 Cervicalgia: Secondary | ICD-10-CM | POA: Diagnosis not present

## 2022-01-08 DIAGNOSIS — M545 Low back pain, unspecified: Secondary | ICD-10-CM | POA: Diagnosis not present

## 2022-01-08 DIAGNOSIS — M62830 Muscle spasm of back: Secondary | ICD-10-CM

## 2022-01-08 DIAGNOSIS — R262 Difficulty in walking, not elsewhere classified: Secondary | ICD-10-CM | POA: Diagnosis not present

## 2022-01-08 DIAGNOSIS — M25561 Pain in right knee: Secondary | ICD-10-CM | POA: Diagnosis not present

## 2022-01-08 NOTE — Therapy (Signed)
not elsewhere classified ? ?Muscle spasm of back ? ?Cervicalgia ? ? ? ? ?Problem List ?Patient Active Problem List  ? Diagnosis Date Noted  ?  Screening mammogram for breast cancer 11/14/2021  ? Foot pain, right 11/14/2021  ? Chronic upper back pain 08/30/2020  ? Aortic atherosclerosis ([facility]) 05/31/2020  ? Abnormal CT scan, small bowel 05/18/2020  ? Insomnia 12/15/2019  ? Chronic low back pain 10/18/2019  ? Positive depression screening 10/18/2019  ? Osteoarthritis of right knee 08/02/2019  ? S/P lumbar spinal fusion 07/31/2017  ? Right low back pain 03/04/2017  ? H/O atrial flutter 03/06/2016  ? Obesity 11/06/2015  ? Urge incontinence 01/24/2015  ? Estrogen deficiency 10/25/2014  ? Lichen planus 16/05/9603  ? Encounter for Medicare annual wellness exam 06/17/2013  ? Screening mammogram, encounter for 06/15/2012  ? Prediabetes 06/03/2011  ? HYPERTENSION, BENIGN ESSENTIAL 10/23/2010  ? OSTEOARTHRITIS, HANDS, BILATERAL 01/24/2010  ? Hypothyroidism 08/24/2008  ? Vitamin D deficiency 06/06/2008  ? Hyperlipidemia 06/06/2008  ? MENOPAUSAL SYNDROME 03/29/2008  ? Osteopenia 03/29/2008  ? Allergic rhinitis 01/25/2008  ? IBS 01/25/2008  ? OSTEOARTHRITIS 01/25/2008  ? FIBROMYALGIA 01/25/2008  ? ? Sumner Boast, PT ?01/08/2022, 1:32 PM ? ? ?[facility] [place] [address] ?[place], [place], [address] ?Phone: [phone]   Fax:  [phone] ? ?Name: Linda Mcgrath ?MRN: [id number] ?Date of Birth: [birth year] ? ? ? ?  [place] ?[place] [address] ?[place], [place], [address] ?Phone: [phone]   Fax:  [phone] ? ?Physical Therapy Treatment ? ?Patient Details  ?Name: Linda Mcgrath ?MRN: [id number] ?Date of Birth: May 31, 1933 ?Referring Provider (PT): [name] ? ? ?Encounter Date: 01/08/2022 ? ? PT End of Session - 01/08/22 1325   ? ? Visit Number 29   ? Date for PT Re-Evaluation 02/08/22   ? Authorization Type Medicare   ? PT Start Time 1300   ? PT Stop Time 1655   ? PT Time Calculation (min) 45 min   ? Activity Tolerance Patient tolerated treatment well   ? Behavior During Therapy [facility] for tasks assessed/performed   ? ?  ?  ? ?  ? ? ?Past Medical History:  ?Diagnosis Date  ? Allergic rhinitis   ? Arthritis   ? Asthma   ? Cataract   ? Bil/lens implant  ? Colon polyp 1999  ? small polyp  ? Diverticulosis   ? Dysrhythmia   ? Fibromyalgia   ? Hyperlipidemia   ? Hypothyroidism   ? Internal hemorrhoids   ? Leg cramps   ? Lichen planus   ? Lung nodule   ? stable LUL 9 mm  ? Menopausal syndrome   ? Osteoarthritis   ? UTI (urinary tract infection)   ? ? ?Past Surgical History:  ?Procedure Laterality Date  ? Abd U/S  11/1998  ? negative  ? Abd U/S  01/2001  ? gallbladder polyps  ? ABDOMINAL HYSTERECTOMY  1975  ? total-fibroids  ? APPENDECTOMY  1975  ? BREAST BIOPSY    ? benign  ? BREAST EXCISIONAL BIOPSY Left 1957  ? CARDIOVERSION N/A 03/07/2016  ? Procedure: CARDIOVERSION;  Surgeon: Adrian Prows, MD;  Location: [facility];  Service: Cardiovascular;  Laterality: N/A;  ? CATARACT EXTRACTION    ? bilateral  ? CHOLECYSTECTOMY    ? COLONOSCOPY  1998  ? Diverticulosis; polyp_0  ? COLONOSCOPY  09/2002  ? Diverticulosis, hem  ? COLONOSCOPY  12/2007  ? diverticulosis, polyp  ? DEXA  10/2001  ? osteopenia  ? ELECTROPHYSIOLOGIC STUDY N/A 04/03/2016  ? Procedure: A-Flutter Ablation;  Surgeon: Will Meredith Leeds, MD;  Location: [facility] CV LAB;  Service: Cardiovascular;  Laterality: N/A;  ?  ESOPHAGOGASTRODUODENOSCOPY  2004  ? Hida scan  01/2001  ? Negative  ? KNEE SURGERY    ? right knee / 11/2004  ? LAMINECTOMY WITH POSTERIOR LATERAL ARTHRODESIS LEVEL 2 N/A 07/31/2017  ? Procedure: DECOMPRESSIVE LAMINECTOMY LUMABR FOUR-FIVE, LUMBAR FIVE-SACRAL ONE;  Surgeon: Eustace Moore, MD;  Location: [facility];  Service: Neurosurgery;  Laterality: N/A;  ? laser surgery for glaucoma Left 09/21/2014  ? MULTIPLE TOOTH EXTRACTIONS    ? rentinal tear    ? ROTATOR CUFF REPAIR    ? x 2 /right shoulder  ? SKIN CANCER EXCISION    ? pre-melanoma / on face  ? TEE WITHOUT CARDIOVERSION N/A 03/07/2016  ? Procedure: TRANSESOPHAGEAL ECHOCARDIOGRAM (TEE);  Surgeon: Adrian Prows, MD;  Location: [facility];  Service: Cardiovascular;  Laterality: N/A;  ? TOE SURGERY    ? rt foot   ? ? ?There were no vitals filed for this visit. ? ? Subjective Assessment - 01/08/22 1303   ? ? Subjective Patient reports that she was doing well, reports that the tband ankle exercises last time really bothered her toes, reports that she had some difficulty walking the past week   ? Currently in Pain?  not elsewhere classified ? ?Muscle spasm of back ? ?Cervicalgia ? ? ? ? ?Problem List ?Patient Active Problem List  ? Diagnosis Date Noted  ?  Screening mammogram for breast cancer 11/14/2021  ? Foot pain, right 11/14/2021  ? Chronic upper back pain 08/30/2020  ? Aortic atherosclerosis ([facility]) 05/31/2020  ? Abnormal CT scan, small bowel 05/18/2020  ? Insomnia 12/15/2019  ? Chronic low back pain 10/18/2019  ? Positive depression screening 10/18/2019  ? Osteoarthritis of right knee 08/02/2019  ? S/P lumbar spinal fusion 07/31/2017  ? Right low back pain 03/04/2017  ? H/O atrial flutter 03/06/2016  ? Obesity 11/06/2015  ? Urge incontinence 01/24/2015  ? Estrogen deficiency 10/25/2014  ? Lichen planus 16/05/9603  ? Encounter for Medicare annual wellness exam 06/17/2013  ? Screening mammogram, encounter for 06/15/2012  ? Prediabetes 06/03/2011  ? HYPERTENSION, BENIGN ESSENTIAL 10/23/2010  ? OSTEOARTHRITIS, HANDS, BILATERAL 01/24/2010  ? Hypothyroidism 08/24/2008  ? Vitamin D deficiency 06/06/2008  ? Hyperlipidemia 06/06/2008  ? MENOPAUSAL SYNDROME 03/29/2008  ? Osteopenia 03/29/2008  ? Allergic rhinitis 01/25/2008  ? IBS 01/25/2008  ? OSTEOARTHRITIS 01/25/2008  ? FIBROMYALGIA 01/25/2008  ? ? Sumner Boast, PT ?01/08/2022, 1:32 PM ? ? ?[facility] [address] ?[place], [place], [address] ?Phone: [phone]   Fax:  [phone] ? ?Name: Linda Mcgrath ?MRN: [id number] ?Date of Birth: [birth year] ? ? ? ?

## 2022-01-08 NOTE — Addendum Note (Signed)
Addended by: Sumner Boast on: 01/08/2022 01:38 PM ? ? Modules accepted: Orders ? ?

## 2022-01-15 ENCOUNTER — Encounter: Payer: Self-pay | Admitting: Physical Therapy

## 2022-01-15 ENCOUNTER — Ambulatory Visit: Payer: Medicare Other | Admitting: Physical Therapy

## 2022-01-15 DIAGNOSIS — M542 Cervicalgia: Secondary | ICD-10-CM

## 2022-01-15 DIAGNOSIS — M545 Low back pain, unspecified: Secondary | ICD-10-CM | POA: Diagnosis not present

## 2022-01-15 DIAGNOSIS — R262 Difficulty in walking, not elsewhere classified: Secondary | ICD-10-CM

## 2022-01-15 DIAGNOSIS — M62830 Muscle spasm of back: Secondary | ICD-10-CM | POA: Diagnosis not present

## 2022-01-15 DIAGNOSIS — G8929 Other chronic pain: Secondary | ICD-10-CM | POA: Diagnosis not present

## 2022-01-15 DIAGNOSIS — M25561 Pain in right knee: Secondary | ICD-10-CM | POA: Diagnosis not present

## 2022-01-15 NOTE — Therapy (Signed)
impairments:  Abnormal gait, Decreased range of motion, Difficulty walking, Increased muscle spasms, Pain, Decreased activity tolerance, Decreased balance, Impaired flexibility, Improper body mechanics, Decreased mobility, Decreased strength, Postural  dysfunction  Visit Diagnosis: Acute bilateral low back pain without sciatica  Difficulty in walking, not elsewhere classified  Muscle spasm of back  Cervicalgia     Problem List Patient Active Problem List   Diagnosis Date Noted   Screening mammogram for breast cancer 11/14/2021   Foot pain, right 11/14/2021   Chronic upper back pain 08/30/2020   Aortic atherosclerosis ([place]) 05/31/2020   Abnormal CT scan, small bowel 05/18/2020   Insomnia 12/15/2019   Chronic low back pain 10/18/2019   Positive depression screening 10/18/2019   Osteoarthritis of right knee 08/02/2019   S/P lumbar spinal fusion 07/31/2017   Right low back pain 03/04/2017   H/O atrial flutter 03/06/2016   Obesity 11/06/2015   Urge incontinence 01/24/2015   Estrogen deficiency 25/00/3704   Lichen planus 88/89/1694   Encounter for Medicare annual wellness exam 06/17/2013   Screening mammogram, encounter for 06/15/2012   Prediabetes 06/03/2011   HYPERTENSION, BENIGN ESSENTIAL 10/23/2010   OSTEOARTHRITIS, HANDS, BILATERAL 01/24/2010   Hypothyroidism 08/24/2008   Vitamin D deficiency 06/06/2008   Hyperlipidemia 06/06/2008   MENOPAUSAL SYNDROME 03/29/2008   Osteopenia 03/29/2008   Allergic rhinitis 01/25/2008   IBS 01/25/2008   OSTEOARTHRITIS 01/25/2008   FIBROMYALGIA 01/25/2008    Sumner Boast, PT 01/15/2022, 1:26 PM  [facility], [place], [address] Phone: [phone]   Fax:  [phone]  Name: Linda Mcgrath MRN: [id number] Date of Birth: 1932-09-09  [facility] [address] [place], [place], [address] Phone: [phone]   Fax:  [phone] Progress Note Reporting Period 11/06/21 to 01/15/22 for visit 21-30  See note below for Objective Data and Assessment of Progress/Goals.     Physical Therapy Treatment  Patient Details  Name: Linda Mcgrath MRN: [id number] Date of Birth: 06/23/1933 Referring Provider (PT): [name]   Encounter Date: 01/15/2022   PT End of Session - 01/15/22 1300     Visit Number 30    Date for PT Re-Evaluation 02/08/22    Authorization Type Medicare    PT Start Time 1255    PT Stop Time 1345    PT Time Calculation (min) 50 min    Activity Tolerance Patient tolerated treatment well    Behavior During Therapy WFL for tasks assessed/performed             Past Medical History:  Diagnosis Date   Allergic rhinitis    Arthritis    Asthma    Cataract    Bil/lens implant   Colon polyp 1999   small polyp   Diverticulosis    Dysrhythmia    Fibromyalgia    Hyperlipidemia    Hypothyroidism    Internal hemorrhoids    Leg cramps    Lichen planus    Lung nodule    stable LUL 9 mm   Menopausal syndrome    Osteoarthritis    UTI (urinary tract infection)     Past Surgical History:  Procedure Laterality Date   Abd U/S  11/1998   negative   Abd U/S  01/2001   gallbladder polyps   ABDOMINAL HYSTERECTOMY  1975   total-fibroids   APPENDECTOMY  1975   BREAST BIOPSY     benign   BREAST EXCISIONAL BIOPSY Left 1957   CARDIOVERSION N/A 03/07/2016   Procedure: CARDIOVERSION;  Surgeon: Jay Ganji, MD;  Location: [facility] ENDOSCOPY;  Service: Cardiovascular;  Laterality: N/A;   CATARACT EXTRACTION     bilateral   CHOLECYSTECTOMY     COLONOSCOPY  1998   Diverticulosis; polyp\   COLONOSCOPY  09/2002   Diverticulosis, hem   COLONOSCOPY  12/2007   diverticulosis, polyp   DEXA  10/2001   osteopenia   ELECTROPHYSIOLOGIC STUDY N/A 04/03/2016    Procedure: A-Flutter Ablation;  Surgeon: Will Martin Camnitz, MD;  Location: [facility] INVASIVE CV LAB;  Service: Cardiovascular;  Laterality: N/A;   ESOPHAGOGASTRODUODENOSCOPY  2004   Hida scan  01/2001   Negative   KNEE SURGERY     right knee / 11/2004   LAMINECTOMY WITH POSTERIOR LATERAL ARTHRODESIS LEVEL 2 N/A 07/31/2017   Procedure: DECOMPRESSIVE LAMINECTOMY LUMABR FOUR-FIVE, LUMBAR FIVE-SACRAL ONE;  Surgeon: Jones, David S, MD;  Location: [facility] OR;  Service: Neurosurgery;  Laterality: N/A;   laser surgery for glaucoma Left 09/21/2014   MULTIPLE TOOTH EXTRACTIONS     rentinal tear     ROTATOR CUFF REPAIR     x 2 /right shoulder   SKIN CANCER EXCISION     pre-melanoma / on face   TEE WITHOUT CARDIOVERSION N/A 03/07/2016   Procedure: TRANSESOPHAGEAL ECHOCARDIOGRAM (TEE);  Surgeon: Jay Ganji, MD;  Location: [facility]OS[facility];  Service: Cardiovascular;  Laterality: N/A;   TOE SURGERY     rt foot     There were no vitals filed for this visit.   Subjective Assessment - 01/15/22 1300     Subjective My rotato cuff hurts on the right, she is not sure   impairments:  Abnormal gait, Decreased range of motion, Difficulty walking, Increased muscle spasms, Pain, Decreased activity tolerance, Decreased balance, Impaired flexibility, Improper body mechanics, Decreased mobility, Decreased strength, Postural  dysfunction  Visit Diagnosis: Acute bilateral low back pain without sciatica  Difficulty in walking, not elsewhere classified  Muscle spasm of back  Cervicalgia     Problem List Patient Active Problem List   Diagnosis Date Noted   Screening mammogram for breast cancer 11/14/2021   Foot pain, right 11/14/2021   Chronic upper back pain 08/30/2020   Aortic atherosclerosis ([facility]) 05/31/2020   Abnormal CT scan, small bowel 05/18/2020   Insomnia 12/15/2019   Chronic low back pain 10/18/2019   Positive depression screening 10/18/2019   Osteoarthritis of right knee 08/02/2019   S/P lumbar spinal fusion 07/31/2017   Right low back pain 03/04/2017   H/O atrial flutter 03/06/2016   Obesity 11/06/2015   Urge incontinence 01/24/2015   Estrogen deficiency 10/25/2014   Lichen planus 12/20/2013   Encounter for Medicare annual wellness exam 06/17/2013   Screening mammogram, encounter for 06/15/2012   Prediabetes 06/03/2011   HYPERTENSION, BENIGN ESSENTIAL 10/23/2010   OSTEOARTHRITIS, HANDS, BILATERAL 01/24/2010   Hypothyroidism 08/24/2008   Vitamin D deficiency 06/06/2008   Hyperlipidemia 06/06/2008   MENOPAUSAL SYNDROME 03/29/2008   Osteopenia 03/29/2008   Allergic rhinitis 01/25/2008   IBS 01/25/2008   OSTEOARTHRITIS 01/25/2008   FIBROMYALGIA 01/25/2008    , W, PT 01/15/2022, 1:26 PM  [facility] [address] [place], [place], [address] Phone: [phone]   Fax:  [phone]  Name: Milan B Deeb MRN: [id number] Date of Birth: 01/27/1933    

## 2022-01-19 ENCOUNTER — Other Ambulatory Visit: Payer: Self-pay | Admitting: Internal Medicine

## 2022-01-21 ENCOUNTER — Other Ambulatory Visit: Payer: Self-pay | Admitting: Family Medicine

## 2022-01-26 ENCOUNTER — Other Ambulatory Visit: Payer: Self-pay | Admitting: Family Medicine

## 2022-01-30 DIAGNOSIS — H401131 Primary open-angle glaucoma, bilateral, mild stage: Secondary | ICD-10-CM | POA: Diagnosis not present

## 2022-01-30 DIAGNOSIS — Z961 Presence of intraocular lens: Secondary | ICD-10-CM | POA: Diagnosis not present

## 2022-02-14 ENCOUNTER — Ambulatory Visit: Payer: Medicare Other

## 2022-03-26 ENCOUNTER — Ambulatory Visit (INDEPENDENT_AMBULATORY_CARE_PROVIDER_SITE_OTHER): Payer: Medicare Other | Admitting: Family

## 2022-03-26 VITALS — BP 120/78 | HR 76 | Temp 98.3°F | Resp 16 | Ht 62.5 in | Wt 166.4 lb

## 2022-03-26 DIAGNOSIS — R350 Frequency of micturition: Secondary | ICD-10-CM | POA: Diagnosis not present

## 2022-03-26 DIAGNOSIS — N898 Other specified noninflammatory disorders of vagina: Secondary | ICD-10-CM | POA: Diagnosis not present

## 2022-03-26 DIAGNOSIS — N3941 Urge incontinence: Secondary | ICD-10-CM | POA: Diagnosis not present

## 2022-03-26 DIAGNOSIS — B3731 Acute candidiasis of vulva and vagina: Secondary | ICD-10-CM

## 2022-03-26 DIAGNOSIS — R300 Dysuria: Secondary | ICD-10-CM | POA: Diagnosis not present

## 2022-03-26 DIAGNOSIS — N3000 Acute cystitis without hematuria: Secondary | ICD-10-CM | POA: Diagnosis not present

## 2022-03-26 LAB — POC URINALSYSI DIPSTICK (AUTOMATED)
Bilirubin, UA: POSITIVE
Blood, UA: NEGATIVE
Glucose, UA: NEGATIVE
Ketones, UA: NEGATIVE
Nitrite, UA: POSITIVE
Protein, UA: NEGATIVE
Spec Grav, UA: 1.015 (ref 1.010–1.025)
Urobilinogen, UA: 1 E.U./dL
pH, UA: 6 (ref 5.0–8.0)

## 2022-03-26 MED ORDER — FLUCONAZOLE 150 MG PO TABS: 150.0000 mg | ORAL_TABLET | Freq: Once | ORAL | 0 refills | Status: AC

## 2022-03-26 MED ORDER — CEPHALEXIN 500 MG PO CAPS: 500.0000 mg | ORAL_CAPSULE | Freq: Four times a day (QID) | ORAL | 0 refills | Status: AC

## 2022-03-26 NOTE — Progress Notes (Signed)
Pending result of urine culture  Blood was seen in urine so will await results as well Already chose to treat

## 2022-03-26 NOTE — Progress Notes (Signed)
Established Patient Office Visit  Subjective:  Patient ID: Linda Mcgrath, female    DOB: Nov 11, 1932  Age: 88 y.o. MRN: [id number]  CC:  Chief Complaint  Patient presents with   Abdominal Pain    X 4 days came on suddenly   Urticaria    X 4 days    HPI [name] is here today with concerns.   Five days ago with lower abd pain and urinary frequency. She has dysuria.  She does report labial swelling.  No vaginal discharge that she knows of.  Not sure if any urinary odor.  Very mild lower back pain.    Past Medical History:  Diagnosis Date   Allergic rhinitis    Arthritis    Asthma    Cataract    Bil/lens implant   Colon polyp 1999   small polyp   Diverticulosis    Dysrhythmia    Fibromyalgia    Hyperlipidemia    Hypothyroidism    Internal hemorrhoids    Leg cramps    Lichen planus    Lung nodule    stable LUL 9 mm   Menopausal syndrome    Osteoarthritis    UTI (urinary tract infection)     Past Surgical History:  Procedure Laterality Date   Abd U/S  11/1998   negative   Abd U/S  01/2001   gallbladder polyps   ABDOMINAL HYSTERECTOMY  1975   total-fibroids   APPENDECTOMY  1975   BREAST BIOPSY     benign   BREAST EXCISIONAL BIOPSY Left 1957   CARDIOVERSION N/A 03/07/2016   Procedure: CARDIOVERSION;  Surgeon: Yates Decamp, MD;  Location: [facility] ENDOSCOPY;  Service: Cardiovascular;  Laterality: N/A;   CATARACT EXTRACTION     bilateral   CHOLECYSTECTOMY     COLONOSCOPY  1998   Diverticulosis; polyp\   COLONOSCOPY  09/2002   Diverticulosis, hem   COLONOSCOPY  12/2007   diverticulosis, polyp   DEXA  10/2001   osteopenia   ELECTROPHYSIOLOGIC STUDY N/A 04/03/2016   Procedure: A-Flutter Ablation;  Surgeon: Will [name], MD;  [facility]: [facility] INVASIVE CV LAB;  Service: Cardiovascular;  Laterality: N/A;   ESOPHAGOGASTRODUODENOSCOPY  2004   Hida scan  01/2001   Negative   KNEE SURGERY     right knee / 11/2004   LAMINECTOMY WITH  POSTERIOR LATERAL ARTHRODESIS LEVEL 2 N/A 07/31/2017   Procedure: DECOMPRESSIVE LAMINECTOMY LUMABR FOUR-FIVE, LUMBAR FIVE-SACRAL ONE;  Surgeon: Tia Alert, MD;  Location: [facility] OR;  Service: Neurosurgery;  Laterality: N/A;   laser surgery for glaucoma Left 09/21/2014   MULTIPLE TOOTH EXTRACTIONS     rentinal tear     ROTATOR CUFF REPAIR     x 2 /right shoulder   SKIN CANCER EXCISION     pre-melanoma / on face   TEE WITHOUT CARDIOVERSION N/A 03/07/2016   Procedure: TRANSESOPHAGEAL ECHOCARDIOGRAM (TEE);  Surgeon: Yates Decamp, MD;  Location: [facility] ENDOSCOPY;  Service: Cardiovascular;  Laterality: N/A;   TOE SURGERY     rt foot     Family History  Problem Relation Age of Onset   Parkinsonism Father    Colon cancer Brother    Allergies Mother    Heart disease Mother    Kidney failure Son        congenital  (kidney transplant)    Heart failure Son        died suddenly of CHF     Social History   Socioeconomic History  Marital status: Widowed    Spouse name: Not on file   Number of children: 2   Years of education: Not on file   Highest education level: Not on file  Occupational History   Occupation: TRAVEL AGENT    Employer: [employer]  Tobacco Use   Smoking status: Never   Smokeless tobacco: Never  Vaping Use   Vaping Use: Never used  Substance and Sexual Activity   Alcohol use: No    Alcohol/week: 0.0 standard drinks of alcohol    Comment: occasional-rare   Drug use: No   Sexual activity: Never  Other Topics Concern   Not on file  Social History Narrative   Non-smoker      occ alcohol      Married-husband does the cooking      Works outside the home   Social Determinants of Corporate investment banker Strain: Low Risk  (09/27/2021)   Overall Financial Resource Strain (CARDIA)    Difficulty of Paying Living Expenses: Not hard at all  Food Insecurity: No Food Insecurity (09/27/2021)   Hunger Vital Sign    Worried About Running Out of Food in the Last Year:  Never true    Ran Out of Food in the Last Year: Never true  Transportation Needs: No Transportation Needs (09/27/2021)   PRAPARE - Administrator, Civil Service (Medical): No    Lack of Transportation (Non-Medical): No  Physical Activity: Insufficiently Active (09/27/2021)   Exercise Vital Sign    Days of Exercise per Week: 1 day    Minutes of Exercise per Session: 60 min  Stress: No Stress Concern Present (09/27/2021)   [employer] of Occupational Health - Occupational Stress Questionnaire    Feeling of Stress : Not at all  Social Connections: Socially Isolated (09/27/2021)   Social Connection and Isolation Panel [NHANES]    Frequency of Communication with Friends and Family: More than three times a week    Frequency of Social Gatherings with Friends and Family: Three times a week    Attends Religious Services: Never    Active Member of Clubs or Organizations: No    Attends Banker Meetings: Never    Marital Status: Widowed  Intimate Partner Violence: Not At Risk (09/27/2021)   Humiliation, Afraid, Rape, and Kick questionnaire    Fear of Current or Ex-Partner: No    Emotionally Abused: No    Physically Abused: No    Sexually Abused: No    Outpatient Medications Prior to Visit  Medication Sig Dispense Refill   Ascorbic Acid (VITAMIN C PO) Take 500 mg by mouth daily.     B Complex Vitamins (VITAMIN B COMPLEX PO) Take 1 tablet by mouth daily.     B COMPLEX, FOLIC ACID, PO Take 666 mcg by mouth daily.     Biotin 10 MG TABS Take 1 tablet by mouth every morning.      Calcium Citrate-Vitamin D 250-200 MG-UNIT TABS Take 1 tablet by mouth daily.     cholestyramine (QUESTRAN) 4 g packet MIX AND DRINK 1 PACKET BY MOUTH DAILY 90 each 0   Cranberry 180 MG CAPS Take 2 capsules by mouth daily.     dextromethorphan-guaiFENesin (MUCINEX DM) 30-600 MG 12hr tablet Take 1 tablet by mouth 2 (two) times daily. 15 tablet 0   Fexofenadine HCl (MUCINEX ALLERGY PO) Take 1 tablet by  mouth daily as needed (Allergies).      folic acid (FOLVITE) 1 MG tablet Take  1 mg by mouth daily.     levothyroxine (SYNTHROID) 88 MCG tablet Take 1 tablet (88 mcg total) by mouth daily before breakfast. 90 tablet 2   loratadine (CLARITIN) 10 MG tablet Take 10 mg by mouth daily as needed for allergies.      MAGNESIUM-OXIDE PO Take 1 tablet by mouth daily.     Multiple Vitamin (MULTIVITAMIN) capsule Take 1 capsule by mouth daily.     Psyllium (METAMUCIL FIBER PO) Take by mouth at bedtime. 2 tablespoon     Pumpkin Seed-Soy Germ (AZO BLADDER CONTROL/GO-LESS PO) Take 1 tablet by mouth 2 (two) times a week.     solifenacin (VESICARE) 5 MG tablet TAKE 1 TABLET(5 MG) BY MOUTH DAILY 30 tablet 11   TURMERIC PO Take 1 capsule by mouth 2 (two) times daily.      vitamin B-12 (CYANOCOBALAMIN) 500 MCG tablet Take 500 mcg by mouth daily.     Vitamin D, Cholecalciferol, 50 MCG (2000 UT) CAPS Take 2 tablets by mouth daily.     fluticasone (FLONASE) 50 MCG/ACT nasal spray Place 1 spray into both nostrils daily for 3 days. 16 g 0   Methenamine-Sodium Salicylate (AZO URINARY TRACT DEFENSE PO) Take 1 capsule by mouth daily. (Patient not taking: Reported on 03/26/2022)     No facility-administered medications prior to visit.    Allergies  Allergen Reactions   Shellfish Allergy Anaphylaxis and Nausea And Vomiting   Tetracycline Hives, Nausea And Vomiting, Rash and Other (See Comments)    SYSTEMIC REACTION: heart palpitations,muscle spasms, vomiting   Amlodipine Besylate Other (See Comments)    REACTION: muscle spasms, extremity swelling, low bp   Ciprofloxacin Other (See Comments)    REACTION: muscle spasms, insomnia   Codeine Other (See Comments)    REACTION: hallucinations   Fosamax [Alendronate Sodium]    Propoxyphene Hcl Other (See Comments)    Unknown   Sulfamethoxazole-Trimethoprim Nausea Only and Other (See Comments)    REACTION: dizzy, could not focus eyes and could not concentrate and nausea    Tape Other (See Comments)    BANDAIDS TAKE OFF HER SKIN; PLEASE USE COBAN OR PAPER    Xarelto [Rivaroxaban] Other (See Comments)    Aches and pains and nervousness   Amoxicillin Rash   Other Swelling, Rash and Other (See Comments)    Has autoimmune disorder and spices erode her salivary glands and affects ankles and mouth DIRECTLY (takes off 1st layer of skin and leaves her unable to walk)   Penicillins Swelling, Rash and Other (See Comments)    Has patient had a PCN reaction causing immediate rash, facial/tongue/throat swelling, SOB or lightheadedness with hypotension: Yes Has patient had a PCN reaction causing severe rash involving mucus membranes or skin necrosis: No Has patient had a PCN reaction that required hospitalization No Has patient had a PCN reaction occurring within the last 10 years: No If all of the above answers are "NO", then may proceed with Cephalosporin use.         Objective:    Physical Exam Constitutional:      General: She is not in acute distress.    Appearance: She is well-developed and normal weight. She is not ill-appearing, toxic-appearing or diaphoretic.  Genitourinary:    Pubic Area: Rash (red rash) present.     Labia:        Right: Rash (tender red reash) and tenderness present.        Left: Rash (tenderness red rash) and tenderness present.  Vagina: Tenderness present. No vaginal discharge.  Neurological:     Mental Status: She is alert.     BP 120/78   Pulse 76   Temp 98.3 F (36.8 C)   Resp 16   Ht 5' 2.5" (1.588 m)   Wt 166 lb 6 oz (75.5 kg)   LMP 08/26/1969   SpO2 96%   BMI 29.95 kg/m  Wt Readings from Last 3 Encounters:  03/26/22 166 lb 6 oz (75.5 kg)  11/14/21 165 lb 2 oz (74.9 kg)  09/27/21 168 lb (76.2 kg)     Health Maintenance Due  Topic Date Due   MAMMOGRAM  02/13/2022   INFLUENZA VACCINE  03/26/2022    There are no preventive care reminders to display for this patient.  Lab Results  Component Value Date    TSH 3.05 11/07/2021   Lab Results  Component Value Date   WBC 7.2 11/07/2021   HGB 14.2 11/07/2021   HCT 42.3 11/07/2021   MCV 98.9 11/07/2021   PLT 304.0 11/07/2021   Lab Results  Component Value Date   NA 139 11/07/2021   K 5.0 11/07/2021   CO2 29 11/07/2021   GLUCOSE 85 11/07/2021   BUN 18 11/07/2021   CREATININE 0.96 11/07/2021   BILITOT 0.7 11/07/2021   ALKPHOS 73 11/07/2021   AST 20 11/07/2021   ALT 16 11/07/2021   PROT 6.7 11/07/2021   ALBUMIN 4.3 11/07/2021   CALCIUM 9.7 11/07/2021   ANIONGAP 8 07/25/2017   GFR 53.54 (L) 11/07/2021   Lab Results  Component Value Date   HGBA1C 5.3 11/07/2021      Assessment & Plan:   Problem List Items Addressed This Visit       Genitourinary   Acute cystitis without hematuria    rx cephalexin 500 mg qid x 7 days  antbx sent to pharmacy, pt to take as directed. Encouraged increased water intake throughout the day. Urine culture/reflex pending results. Choosing to treat due to being symptomatic. If no improvement in the next 2 days pt advised to let me know.       Relevant Medications   cephALEXin (KEFLEX) 500 MG capsule   Vaginal itching   Relevant Medications   cephALEXin (KEFLEX) 500 MG capsule   Other Relevant Orders   WET PREP BY MOLECULAR PROBE   Vaginal candida - Primary    rx diflucan 150 mg once for one dose  vagisil prn for irritation       Relevant Medications   fluconazole (DIFLUCAN) 150 MG tablet   cephALEXin (KEFLEX) 500 MG capsule   Other Relevant Orders   Urine Culture   POCT Urinalysis Dipstick (Automated)   WET PREP BY MOLECULAR PROBE     Other   Urinary frequency    poct urine dip in office Urine culture ordered pending results       Relevant Medications   cephALEXin (KEFLEX) 500 MG capsule   Other Relevant Orders   Urine Culture   POCT Urinalysis Dipstick (Automated)    Meds ordered this encounter  Medications   fluconazole (DIFLUCAN) 150 MG tablet    Sig: Take 1 tablet  (150 mg total) by mouth once for 1 dose.    Dispense:  1 tablet    Refill:  0    Order Specific Question:   Supervising Provider    Answer:   BEDSOLE, AMY E [[id number]]   cephALEXin (KEFLEX) 500 MG capsule    Sig: Take 1 capsule (500 mg total)  by mouth 4 (four) times daily for 7 days.    Dispense:  28 capsule    Refill:  0    Order Specific Question:   Supervising Provider    Answer:   BEDSOLE, AMY E [[id number]]    Follow-up: Return if symptoms worsen or fail to improve with pcp.    Mort Sawyers, FNP

## 2022-03-26 NOTE — Assessment & Plan Note (Signed)
poct urine dip in office Urine culture ordered pending results  

## 2022-03-26 NOTE — Assessment & Plan Note (Signed)
rx diflucan 150 mg once for one dose  vagisil prn for irritation

## 2022-03-26 NOTE — Patient Instructions (Addendum)
Sending in diflucan pill for possible yeast Sending in sample for urine and vaginal culture pending results.   Can use vagisil over the counter for vaginal irritation.   Due to recent changes in healthcare laws, you may see results of your imaging and/or laboratory studies on MyChart before I have had a chance to review them.  I understand that in some cases there may be results that are confusing or concerning to you. Please understand that not all results are received at the same time and often I may need to interpret multiple results in order to provide you with the best plan of care or course of treatment. Therefore, I ask that you please give me 2 business days to thoroughly review all your results before contacting my office for clarification. Should we see a critical lab result, you will be contacted sooner.   It was a pleasure seeing you today! Please do not hesitate to reach out with any questions and or concerns.  Regards,   Eugenia Pancoast FNP-C

## 2022-03-26 NOTE — Assessment & Plan Note (Signed)
rx cephalexin 500 mg qid x 7 days  antbx sent to pharmacy, pt to take as directed. Encouraged increased water intake throughout the day. Urine culture/reflex pending results. Choosing to treat due to being symptomatic. If no improvement in the next 2 days pt advised to let me know.

## 2022-03-27 NOTE — Progress Notes (Signed)
Negative for BV trich yeast

## 2022-03-28 LAB — URINE CULTURE
MICRO NUMBER:: 13720694
SPECIMEN QUALITY:: ADEQUATE

## 2022-03-28 LAB — WET PREP BY MOLECULAR PROBE
Candida species: NOT DETECTED
Gardnerella vaginalis: NOT DETECTED
MICRO NUMBER:: 13720693
SPECIMEN QUALITY:: ADEQUATE
Trichomonas vaginosis: NOT DETECTED

## 2022-03-28 NOTE — Progress Notes (Signed)
UTI came back positive for infection.  Continue keflex given at appt as this should be good for the UTI.

## 2022-04-04 ENCOUNTER — Encounter: Payer: Self-pay | Admitting: Family Medicine

## 2022-04-04 ENCOUNTER — Ambulatory Visit (INDEPENDENT_AMBULATORY_CARE_PROVIDER_SITE_OTHER): Payer: Medicare Other | Admitting: Family Medicine

## 2022-04-04 VITALS — BP 124/80 | HR 61 | Ht 62.5 in | Wt 165.8 lb

## 2022-04-04 DIAGNOSIS — B3731 Acute candidiasis of vulva and vagina: Secondary | ICD-10-CM | POA: Diagnosis not present

## 2022-04-04 DIAGNOSIS — N3000 Acute cystitis without hematuria: Secondary | ICD-10-CM | POA: Diagnosis not present

## 2022-04-04 DIAGNOSIS — N898 Other specified noninflammatory disorders of vagina: Secondary | ICD-10-CM | POA: Diagnosis not present

## 2022-04-04 LAB — POC URINALSYSI DIPSTICK (AUTOMATED)
Bilirubin, UA: NEGATIVE
Blood, UA: POSITIVE
Glucose, UA: NEGATIVE
Ketones, UA: NEGATIVE
Nitrite, UA: POSITIVE
Protein, UA: POSITIVE — AB
Spec Grav, UA: 1.02 (ref 1.010–1.025)
Urobilinogen, UA: 1 E.U./dL
pH, UA: 5.5 (ref 5.0–8.0)

## 2022-04-04 LAB — POCT WET PREP (WET MOUNT): Trichomonas Wet Prep HPF POC: ABSENT

## 2022-04-04 MED ORDER — FLUCONAZOLE 150 MG PO TABS: ORAL_TABLET | ORAL | 0 refills | Status: DC

## 2022-04-04 NOTE — Progress Notes (Signed)
Subjective:    Patient ID: Linda Mcgrath, female    DOB: Jun 02, 1933, 88 y.o.   MRN: [id number]  HPI Pt presents for f/u of uti and yeast vaginitis   Wt Readings from Last 3 Encounters:  04/04/22 165 lb 12.8 oz (75.2 kg)  03/26/22 166 lb 6 oz (75.5 kg)  11/14/21 165 lb 2 oz (74.9 kg)   29.84 kg/m    She was seen by NP Dugal on 8/1 Px cephalexin for uti  Culture grew e coli resistant to quinolones    Diflucan 150 mg for yeast  Wet prep did not demonstrate anything  Vaginal symptoms continue  Feels swollen and uncomfortable    H/o hysterectomy in the past   The bladder pressure/heaviness improved    Itching never stopped but it improved then returned   Frequency and urgency are a little improved  Has to get up to urinate every 2 hours at night  Itching keeps her up  Her low back hurts a bit more than usual  Cannot see blood in her urine   Some burning to urinate- may be from urine hitting the irritated vaginal area   Using vagisil for yeast   Drinks a lot of water     Lab Results  Component Value Date   CREATININE 0.96 11/07/2021   BUN 18 11/07/2021   NA 139 11/07/2021   K 5.0 11/07/2021   CL 104 11/07/2021   CO2 29 11/07/2021   Results for orders placed or performed in visit on 04/04/22  POCT Wet Prep [employer] [facility])  Result Value Ref Range   Source Wet Prep POC vaginal    WBC, Wet Prep HPF POC moderate    Bacteria Wet Prep HPF POC Few Few   BACTERIA WET PREP MORPHOLOGY POC     Clue Cells Wet Prep HPF POC None None   Clue Cells Wet Prep Whiff POC     Yeast Wet Prep HPF POC Moderate (A) None   KOH Wet Prep POC Moderate (A) None   Trichomonas Wet Prep HPF POC Absent Absent  POCT Urinalysis Dipstick (Automated)  Result Value Ref Range   Color, UA orange    Clarity, UA clear    Glucose, UA Negative Negative   Bilirubin, UA neg    Ketones, UA neg    Spec Grav, UA 1.020 1.010 - 1.025   Blood, UA positive    pH, UA 5.5 5.0 - 8.0    Protein, UA Positive (A) Negative   Urobilinogen, UA 1.0 0.2 or 1.0 E.U./dL   Nitrite, UA positive    Leukocytes, UA Small (1+) (A) Negative   Patient Active Problem List   Diagnosis Date Noted   Acute cystitis without hematuria 03/26/2022   Vaginal itching 03/26/2022   Urinary frequency 03/26/2022   Vaginal candida 03/26/2022   Chronic upper back pain 08/30/2020   Aortic atherosclerosis (HCC) 05/31/2020   Abnormal CT scan, small bowel 05/18/2020   Insomnia 12/15/2019   Chronic low back pain 10/18/2019   Osteoarthritis of right knee 08/02/2019   S/P lumbar spinal fusion 07/31/2017   H/O atrial flutter 03/06/2016   Obesity 11/06/2015   Urge incontinence 01/24/2015   Estrogen deficiency 10/25/2014   Lichen planus 12/20/2013   Prediabetes 06/03/2011   HYPERTENSION, BENIGN ESSENTIAL 10/23/2010   OSTEOARTHRITIS, HANDS, BILATERAL 01/24/2010   Hypothyroidism 08/24/2008   Vitamin D deficiency 06/06/2008   Hyperlipidemia 06/06/2008   MENOPAUSAL SYNDROME 03/29/2008   Osteopenia 03/29/2008   Allergic rhinitis 01/25/2008  IBS 01/25/2008   OSTEOARTHRITIS 01/25/2008   FIBROMYALGIA 01/25/2008   Past Medical History:  Diagnosis Date   Allergic rhinitis    Arthritis    Asthma    Cataract    Bil/lens implant   Colon polyp 1999   small polyp   Diverticulosis    Dysrhythmia    Fibromyalgia    Hyperlipidemia    Hypothyroidism    Internal hemorrhoids    Leg cramps    Lichen planus    Lung nodule    stable LUL 9 mm   Menopausal syndrome    Osteoarthritis    UTI (urinary tract infection)    Past Surgical History:  Procedure Laterality Date   Abd U/S  11/1998   negative   Abd U/S  01/2001   gallbladder polyps   ABDOMINAL HYSTERECTOMY  1975   total-fibroids   APPENDECTOMY  1975   BREAST BIOPSY     benign   BREAST EXCISIONAL BIOPSY Left 1957   CARDIOVERSION N/A 03/07/2016   Procedure: CARDIOVERSION;  Surgeon: Yates Decamp, MD;  Location[facility] ENDOSCOPY;  Service:  Cardiovascular;  Laterality: N/A;   CATARACT EXTRACTION     bilateral   CHOLECYSTECTOMY     COLONOSCOPY  1998   Diverticulosis; polyp\   COLONOSCOPY  09/2002   Diverticulosis, hem   COLONOSCOPY  12/2007   diverticulosis, polyp   DEXA  10/2001   osteopenia   ELECTROPHYSIOLOGIC STUDY N/A 04/03/2016   Procedure: A-Flutter Ablation;  Surgeon: Will [name], MD;  Location: [facility] INVASIVE CV LAB;  Service: Cardiovascular;  Laterality: N/A;   ESOPHAGOGASTRODUODENOSCOPY  2004   Hida scan  01/2001   Negative   KNEE SURGERY     right knee / 11/2004   LAMINECTOMY WITH POSTERIOR LATERAL ARTHRODESIS LEVEL 2 N/A 07/31/2017   Procedure: DECOMPRESSIVE LAMINECTOMY LUMABR FOUR-FIVE, LUMBAR FIVE-SACRAL ONE;  Surgeon: Tia Alert, MD;  Location: [facility] OR;  Service: Neurosurgery;  Laterality: N/A;   laser surgery for glaucoma Left 09/21/2014   MULTIPLE TOOTH EXTRACTIONS     rentinal tear     ROTATOR CUFF REPAIR     x 2 /right shoulder   SKIN CANCER EXCISION     pre-melanoma / on face   TEE WITHOUT CARDIOVERSION N/A 03/07/2016   Procedure: TRANSESOPHAGEAL ECHOCARDIOGRAM (TEE);  Surgeon: Yates Decamp, MD;  Location: [facility] ENDOSCOPY;  Service: Cardiovascular;  Laterality: N/A;   TOE SURGERY     rt foot    Social History   Tobacco Use   Smoking status: Never   Smokeless tobacco: Never  Vaping Use   Vaping Use: Never used  Substance Use Topics   Alcohol use: No    Alcohol/week: 0.0 standard drinks of alcohol    Comment: occasional-rare   Drug use: No   Family History  Problem Relation Age of Onset   Parkinsonism Father    Colon cancer Brother    Allergies Mother    Heart disease Mother    Kidney failure Son        congenital  (kidney transplant)    Heart failure Son        died suddenly of CHF    Allergies  Allergen Reactions   Shellfish Allergy Anaphylaxis and Nausea And Vomiting   Tetracycline Hives, Nausea And Vomiting, Rash and Other (See Comments)    SYSTEMIC REACTION: heart  palpitations,muscle spasms, vomiting   Amlodipine Besylate Other (See Comments)    REACTION: muscle spasms, extremity swelling, low bp   Ciprofloxacin Other (See  Comments)    REACTION: muscle spasms, insomnia   Codeine Other (See Comments)    REACTION: hallucinations   Fosamax [Alendronate Sodium]    Propoxyphene Hcl Other (See Comments)    Unknown   Sulfamethoxazole-Trimethoprim Nausea Only and Other (See Comments)    REACTION: dizzy, could not focus eyes and could not concentrate and nausea   Tape Other (See Comments)    BANDAIDS TAKE OFF HER SKIN; PLEASE USE COBAN OR PAPER    Xarelto [Rivaroxaban] Other (See Comments)    Aches and pains and nervousness   Amoxicillin Rash   Other Swelling, Rash and Other (See Comments)    Has autoimmune disorder and spices erode her salivary glands and affects ankles and mouth DIRECTLY (takes off 1st layer of skin and leaves her unable to walk)   Penicillins Swelling, Rash and Other (See Comments)    Has patient had a PCN reaction causing immediate rash, facial/tongue/throat swelling, SOB or lightheadedness with hypotension: Yes Has patient had a PCN reaction causing severe rash involving mucus membranes or skin necrosis: No Has patient had a PCN reaction that required hospitalization No Has patient had a PCN reaction occurring within the last 10 years: No If all of the above answers are "NO", then may proceed with Cephalosporin use.    Current Outpatient Medications on File Prior to Visit  Medication Sig Dispense Refill   Ascorbic Acid (VITAMIN C PO) Take 500 mg by mouth daily.     B Complex Vitamins (VITAMIN B COMPLEX PO) Take 1 tablet by mouth daily.     B COMPLEX, FOLIC ACID, PO Take 666 mcg by mouth daily.     Biotin 10 MG TABS Take 1 tablet by mouth every morning.      Calcium Citrate-Vitamin D 250-200 MG-UNIT TABS Take 1 tablet by mouth daily.     cholestyramine (QUESTRAN) 4 g packet MIX AND DRINK 1 PACKET BY MOUTH DAILY 90 each 0    Cranberry 180 MG CAPS Take 2 capsules by mouth daily.     dextromethorphan-guaiFENesin (MUCINEX DM) 30-600 MG 12hr tablet Take 1 tablet by mouth 2 (two) times daily. 15 tablet 0   Fexofenadine HCl (MUCINEX ALLERGY PO) Take 1 tablet by mouth daily as needed (Allergies).      folic acid (FOLVITE) 1 MG tablet Take 1 mg by mouth daily.     levothyroxine (SYNTHROID) 88 MCG tablet Take 1 tablet (88 mcg total) by mouth daily before breakfast. 90 tablet 2   loratadine (CLARITIN) 10 MG tablet Take 10 mg by mouth daily as needed for allergies.      MAGNESIUM-OXIDE PO Take 1 tablet by mouth daily.     Methenamine-Sodium Salicylate (AZO URINARY TRACT DEFENSE PO) Take 1 capsule by mouth daily.     Multiple Vitamin (MULTIVITAMIN) capsule Take 1 capsule by mouth daily.     Psyllium (METAMUCIL FIBER PO) Take by mouth at bedtime. 2 tablespoon     Pumpkin Seed-Soy Germ (AZO BLADDER CONTROL/GO-LESS PO) Take 1 tablet by mouth 2 (two) times a week.     solifenacin (VESICARE) 5 MG tablet TAKE 1 TABLET(5 MG) BY MOUTH DAILY 30 tablet 11   TURMERIC PO Take 1 capsule by mouth 2 (two) times daily.      vitamin B-12 (CYANOCOBALAMIN) 500 MCG tablet Take 500 mcg by mouth daily.     Vitamin D, Cholecalciferol, 50 MCG (2000 UT) CAPS Take 2 tablets by mouth daily.     fluticasone (FLONASE) 50 MCG/ACT nasal spray Place  1 spray into both nostrils daily for 3 days. 16 g 0   No current facility-administered medications on file prior to visit.      Review of Systems  Constitutional:  Negative for activity change, appetite change, fatigue, fever and unexpected weight change.  HENT:  Negative for congestion, ear pain, rhinorrhea, sinus pressure and sore throat.   Eyes:  Negative for pain, redness and visual disturbance.  Respiratory:  Negative for cough, shortness of breath and wheezing.   Cardiovascular:  Negative for chest pain and palpitations.  Gastrointestinal:  Negative for abdominal pain, blood in stool, constipation and  diarrhea.  Endocrine: Negative for polydipsia and polyuria.  Genitourinary:  Negative for frequency, urgency, vaginal bleeding and vaginal discharge.       Vulvar itching and irritation  Burns when urine touches it   Musculoskeletal:  Negative for arthralgias, back pain and myalgias.  Skin:  Negative for pallor and rash.  Allergic/Immunologic: Negative for environmental allergies.  Neurological:  Negative for dizziness, syncope and headaches.  Hematological:  Negative for adenopathy. Does not bruise/bleed easily.  Psychiatric/Behavioral:  Negative for decreased concentration and dysphoric mood. The patient is not nervous/anxious.        Objective:   Physical Exam Constitutional:      General: She is not in acute distress.    Appearance: Normal appearance. She is obese. She is not ill-appearing or diaphoretic.  Eyes:     General: No scleral icterus.       Right eye: No discharge.        Left eye: No discharge.     Conjunctiva/sclera: Conjunctivae normal.     Pupils: Pupils are equal, round, and reactive to light.  Cardiovascular:     Rate and Rhythm: Normal rate and regular rhythm.     Heart sounds: Normal heart sounds.  Pulmonary:     Effort: Pulmonary effort is normal. No respiratory distress.     Breath sounds: Normal breath sounds.  Abdominal:     Tenderness: There is no right CVA tenderness or left CVA tenderness.     Comments: No suprapubic tenderness or fullness     Genitourinary:    Comments: Labia minora are erythematous No tenderness or skin breakdown  Mucosa is mildly erythematous   Scant vaginal discharge   Very small introitus - did not admit finger for full bimanual exam    Musculoskeletal:     Cervical back: Neck supple.  Skin:    General: Skin is warm and dry.  Neurological:     Mental Status: She is alert.  Psychiatric:        Mood and Affect: Mood normal.           Assessment & Plan:   Problem List Items Addressed This Visit        Genitourinary   Acute cystitis without hematuria - Primary    Symptoms are improved E coli sens to keflex -completed course  ua today is improved  Culture pending       Relevant Orders   Urine Culture   POCT Urinalysis Dipstick (Automated) (Completed)   Vaginal candida   Relevant Medications   fluconazole (DIFLUCAN) 150 MG tablet   Vaginal itching    This improved with tx of yeast and then returned after course of abx for uti   Will tx with dilfucan 150 every 3 d for 3 doses  Also topical vagisil if needed for comfort  Adv to dry well after bathing  Update if not  starting to improve in a week or if worsening        Relevant Orders   POCT Wet Prep [facility]) (Completed)

## 2022-04-04 NOTE — Assessment & Plan Note (Signed)
This improved with tx of yeast and then returned after course of abx for uti   Will tx with dilfucan 150 every 3 d for 3 doses  Also topical vagisil if needed for comfort  Adv to dry well after bathing  Update if not starting to improve in a week or if worsening

## 2022-04-04 NOTE — Patient Instructions (Signed)
I think you have yeast  Dry well after bathing   Take the diflucan orally one pill every 3 days for 3 doses   Let's check another urine culture as well   If symptoms worsen please let us know   Update if not starting to improve in a week or if worsening    Probiotics or yogurt  may help also   The topical vagisil is ok

## 2022-04-04 NOTE — Assessment & Plan Note (Signed)
Symptoms are improved E coli sens to keflex -completed course  ua today is improved  Culture pending

## 2022-04-08 LAB — URINE CULTURE
MICRO NUMBER:: 13762043
SPECIMEN QUALITY:: ADEQUATE

## 2022-04-09 ENCOUNTER — Other Ambulatory Visit: Payer: Self-pay

## 2022-04-09 DIAGNOSIS — R350 Frequency of micturition: Secondary | ICD-10-CM

## 2022-04-09 MED ORDER — NITROFURANTOIN MONOHYD MACRO 100 MG PO CAPS: 100.0000 mg | ORAL_CAPSULE | Freq: Two times a day (BID) | ORAL | 0 refills | Status: AC

## 2022-04-20 ENCOUNTER — Other Ambulatory Visit: Payer: Self-pay | Admitting: Internal Medicine

## 2022-05-06 ENCOUNTER — Ambulatory Visit
Admission: RE | Admit: 2022-05-06 | Discharge: 2022-05-06 | Disposition: A | Discharge status: Home / Self Care | Payer: Medicare Other | Source: Ambulatory Visit | Attending: Family Medicine | Admitting: Family Medicine

## 2022-05-06 DIAGNOSIS — M810 Age-related osteoporosis without current pathological fracture: Secondary | ICD-10-CM | POA: Diagnosis not present

## 2022-05-06 DIAGNOSIS — Z780 Asymptomatic menopausal state: Secondary | ICD-10-CM | POA: Diagnosis not present

## 2022-05-06 DIAGNOSIS — M85851 Other specified disorders of bone density and structure, right thigh: Secondary | ICD-10-CM | POA: Diagnosis not present

## 2022-05-06 DIAGNOSIS — E2839 Other primary ovarian failure: Secondary | ICD-10-CM

## 2022-05-06 DIAGNOSIS — Z1231 Encounter for screening mammogram for malignant neoplasm of breast: Secondary | ICD-10-CM | POA: Diagnosis not present

## 2022-05-07 ENCOUNTER — Telehealth: Payer: Self-pay | Admitting: Family Medicine

## 2022-05-07 ENCOUNTER — Encounter: Payer: Self-pay | Admitting: *Deleted

## 2022-05-07 NOTE — Telephone Encounter (Signed)
Pt called in wants PCP to know she would like to continue with Physical Therapy . Please advise [phone]

## 2022-05-08 DIAGNOSIS — H31092 Other chorioretinal scars, left eye: Secondary | ICD-10-CM | POA: Diagnosis not present

## 2022-05-08 DIAGNOSIS — H35363 Drusen (degenerative) of macula, bilateral: Secondary | ICD-10-CM | POA: Diagnosis not present

## 2022-05-08 DIAGNOSIS — Z961 Presence of intraocular lens: Secondary | ICD-10-CM | POA: Diagnosis not present

## 2022-05-08 DIAGNOSIS — H35453 Secondary pigmentary degeneration, bilateral: Secondary | ICD-10-CM | POA: Diagnosis not present

## 2022-05-08 DIAGNOSIS — H353111 Nonexudative age-related macular degeneration, right eye, early dry stage: Secondary | ICD-10-CM | POA: Diagnosis not present

## 2022-05-08 DIAGNOSIS — H35372 Puckering of macula, left eye: Secondary | ICD-10-CM | POA: Diagnosis not present

## 2022-05-09 ENCOUNTER — Telehealth: Payer: Self-pay | Admitting: Family Medicine

## 2022-05-09 MED ORDER — FLUCONAZOLE 150 MG PO TABS: ORAL_TABLET | ORAL | 0 refills | Status: DC

## 2022-05-09 NOTE — Telephone Encounter (Signed)
Pt notified Rx sent to pharmacy and advised to f/u if no improvement

## 2022-05-09 NOTE — Telephone Encounter (Signed)
I sent diflucan  Take one now and one in 3 d  If symptoms are not improved please follow up

## 2022-05-09 NOTE — Addendum Note (Signed)
Addended by: Loura Pardon A on: 05/09/2022 11:29 AM   Modules accepted: Orders

## 2022-05-09 NOTE — Telephone Encounter (Signed)
Patient called in and stated that she believes her yeast infection has come back. She stated that its painful, and she has been feeling grumpy. She stated that she is out of refills. Please advise. Thank you!

## 2022-05-16 ENCOUNTER — Encounter: Payer: Self-pay | Admitting: Family Medicine

## 2022-05-16 ENCOUNTER — Ambulatory Visit (INDEPENDENT_AMBULATORY_CARE_PROVIDER_SITE_OTHER): Payer: Medicare Other | Admitting: Family Medicine

## 2022-05-16 VITALS — BP 124/72 | HR 65 | Temp 97.7°F | Ht 62.5 in | Wt 165.1 lb

## 2022-05-16 DIAGNOSIS — G8929 Other chronic pain: Secondary | ICD-10-CM | POA: Diagnosis not present

## 2022-05-16 DIAGNOSIS — Z23 Encounter for immunization: Secondary | ICD-10-CM | POA: Diagnosis not present

## 2022-05-16 DIAGNOSIS — E559 Vitamin D deficiency, unspecified: Secondary | ICD-10-CM | POA: Diagnosis not present

## 2022-05-16 DIAGNOSIS — M545 Low back pain, unspecified: Secondary | ICD-10-CM

## 2022-05-16 DIAGNOSIS — M549 Dorsalgia, unspecified: Secondary | ICD-10-CM | POA: Diagnosis not present

## 2022-05-16 DIAGNOSIS — M810 Age-related osteoporosis without current pathological fracture: Secondary | ICD-10-CM | POA: Diagnosis not present

## 2022-05-16 MED ORDER — ALENDRONATE SODIUM 70 MG PO TABS: 70.0000 mg | ORAL_TABLET | ORAL | 11 refills | Status: DC

## 2022-05-16 NOTE — Patient Instructions (Addendum)
For leg cramps  Take a teaspoon of mustard every evening  Stay well hydrated   Magnesium 250 to 500 mg daily is helpful also  If it causes diarrhea -stop it   Try alendronate weekly  Take it as directed (with water and do not eat /drink or lie back down for 30 minutes)   If any side effects stop it and call  If it works out we do a 5 year recall   I placed a PT referral Work on strength/weight bearing exercise  Be careful not to fall

## 2022-05-16 NOTE — Progress Notes (Signed)
Subjective:    Patient ID: Linda Mcgrath, female    DOB: 1933/07/26, 88 y.o.   MRN: [id number]  HPI Pt presents for f/u of dexa for bone loss  Wt Readings from Last 3 Encounters:  05/16/22 165 lb 2 oz (74.9 kg)  04/04/22 165 lb 12.8 oz (75.2 kg)  03/26/22 166 lb 6 oz (75.5 kg)   29.72 kg/m  Dexa 04/2022  Lowest T score -2.8 for forearm  RFN -2.4 (down from -1.9)  Supplements : vitamin D 4000 iu daily  Calcium - unsure of amount   Last D level was 52 Lab Results  Component Value Date   CALCIUM 9.7 11/07/2021   PHOS 3.1 06/22/2009   Lab Results  Component Value Date   TSH 3.05 11/07/2021   Short stature/ petite and has lost height in the past   No family history   Exercise : wants to do more physical therapy for her joints  Wants a referral for 2 times per week   She is active Has to run after the dogs at home  House work/ takes out the trash  Not a lot of outdoor walks -not safe    Falls : none  No new fractures  No lifetime fractures   Chronic upper back pain  Has had surgery in the past  No h/o compression fracture   Took alendonate in the past   A lot of pm leg cramps This launches her out of bed   Patient Active Problem List   Diagnosis Date Noted   Acute cystitis without hematuria 03/26/2022   Vaginal itching 03/26/2022   Urinary frequency 03/26/2022   Vaginal candida 03/26/2022   Chronic upper back pain 08/30/2020   Aortic atherosclerosis (HCC) 05/31/2020   Abnormal CT scan, small bowel 05/18/2020   Insomnia 12/15/2019   Chronic low back pain 10/18/2019   Osteoarthritis of right knee 08/02/2019   S/P lumbar spinal fusion 07/31/2017   H/O atrial flutter 03/06/2016   Obesity 11/06/2015   Urge incontinence 01/24/2015   Estrogen deficiency 10/25/2014   Lichen planus 12/20/2013   Prediabetes 06/03/2011   HYPERTENSION, BENIGN ESSENTIAL 10/23/2010   OSTEOARTHRITIS, HANDS, BILATERAL 01/24/2010   Hypothyroidism 08/24/2008    Vitamin D deficiency 06/06/2008   Hyperlipidemia 06/06/2008   MENOPAUSAL SYNDROME 03/29/2008   Osteoporosis 03/29/2008   Allergic rhinitis 01/25/2008   IBS 01/25/2008   OSTEOARTHRITIS 01/25/2008   FIBROMYALGIA 01/25/2008   Past Medical History:  Diagnosis Date   Allergic rhinitis    Arthritis    Asthma    Cataract    Bil/lens implant   Colon polyp 1999   small polyp   Diverticulosis    Dysrhythmia    Fibromyalgia    Hyperlipidemia    Hypothyroidism    Internal hemorrhoids    Leg cramps    Lichen planus    Lung nodule    stable LUL 9 mm   Menopausal syndrome    Osteoarthritis    UTI (urinary tract infection)    Past Surgical History:  Procedure Laterality Date   Abd U/S  11/1998   negative   Abd U/S  01/2001   gallbladder polyps   ABDOMINAL HYSTERECTOMY  1975   total-fibroids   APPENDECTOMY  1975   BREAST BIOPSY     benign   BREAST EXCISIONAL BIOPSY Left 1957   CARDIOVERSION N/A 03/07/2016   Procedure: CARDIOVERSION;  Surgeon: Yates Decamp, MD;  Location: [facility] ENDOSCOPY;  Service: Cardiovascular;  Laterality: N/A;  CATARACT EXTRACTION     bilateral   CHOLECYSTECTOMY     COLONOSCOPY  1998   Diverticulosis; polyp\   COLONOSCOPY  09/2002   Diverticulosis, hem   COLONOSCOPY  12/2007   diverticulosis, polyp   DEXA  10/2001   osteopenia   ELECTROPHYSIOLOGIC STUDY N/A 04/03/2016   Procedure: A-Flutter Ablation;  Surgeon: Will [name], MD;  Location: [facility] INVASIVE CV LAB;  Service: Cardiovascular;  Laterality: N/A;   ESOPHAGOGASTRODUODENOSCOPY  2004   Hida scan  01/2001   Negative   KNEE SURGERY     right knee / 11/2004   LAMINECTOMY WITH POSTERIOR LATERAL ARTHRODESIS LEVEL 2 N/A 07/31/2017   Procedure: DECOMPRESSIVE LAMINECTOMY LUMABR FOUR-FIVE, LUMBAR FIVE-SACRAL ONE;  Surgeon: Tia Alert, MD;  Location: [facility] OR;  Service: Neurosurgery;  Laterality: N/A;   laser surgery for glaucoma Left 09/21/2014   MULTIPLE TOOTH EXTRACTIONS     rentinal tear      ROTATOR CUFF REPAIR     x 2 /right shoulder   SKIN CANCER EXCISION     pre-melanoma / on face   TEE WITHOUT CARDIOVERSION N/A 03/07/2016   Procedure: TRANSESOPHAGEAL ECHOCARDIOGRAM (TEE);  Surgeon: Yates Decamp, MD;  Location: [facility] ENDOSCOPY;  Service: Cardiovascular;  Laterality: N/A;   TOE SURGERY     rt foot    Social History   Tobacco Use   Smoking status: Never   Smokeless tobacco: Never  Vaping Use   Vaping Use: Never used  Substance Use Topics   Alcohol use: No    Alcohol/week: 0.0 standard drinks of alcohol    Comment: occasional-rare   Drug use: No   Family History  Problem Relation Age of Onset   Parkinsonism Father    Colon cancer Brother    Allergies Mother    Heart disease Mother    Kidney failure Son        congenital  (kidney transplant)    Heart failure Son        died suddenly of CHF    Allergies  Allergen Reactions   Shellfish Allergy Anaphylaxis and Nausea And Vomiting   Tetracycline Hives, Nausea And Vomiting, Rash and Other (See Comments)    SYSTEMIC REACTION: heart palpitations,muscle spasms, vomiting   Amlodipine Besylate Other (See Comments)    REACTION: muscle spasms, extremity swelling, low bp   Ciprofloxacin Other (See Comments)    REACTION: muscle spasms, insomnia   Codeine Other (See Comments)    REACTION: hallucinations   Propoxyphene Hcl Other (See Comments)    Unknown   Sulfamethoxazole-Trimethoprim Nausea Only and Other (See Comments)    REACTION: dizzy, could not focus eyes and could not concentrate and nausea   Tape Other (See Comments)    BANDAIDS TAKE OFF HER SKIN; PLEASE USE COBAN OR PAPER    Xarelto [Rivaroxaban] Other (See Comments)    Aches and pains and nervousness   Amoxicillin Rash   Other Swelling, Rash and Other (See Comments)    Has autoimmune disorder and spices erode her salivary glands and affects ankles and mouth DIRECTLY (takes off 1st layer of skin and leaves her unable to walk)   Penicillins Swelling, Rash and  Other (See Comments)    Has patient had a PCN reaction causing immediate rash, facial/tongue/throat swelling, SOB or lightheadedness with hypotension: Yes Has patient had a PCN reaction causing severe rash involving mucus membranes or skin necrosis: No Has patient had a PCN reaction that required hospitalization No Has patient had a PCN reaction occurring  within the last 10 years: No If all of the above answers are "NO", then may proceed with Cephalosporin use.    Current Outpatient Medications on File Prior to Visit  Medication Sig Dispense Refill   Ascorbic Acid (VITAMIN C PO) Take 500 mg by mouth daily.     B Complex Vitamins (VITAMIN B COMPLEX PO) Take 1 tablet by mouth daily.     B COMPLEX, FOLIC ACID, PO Take 666 mcg by mouth daily.     Biotin 10 MG TABS Take 1 tablet by mouth every morning.      Calcium Citrate-Vitamin D 250-200 MG-UNIT TABS Take 1 tablet by mouth daily.     cholestyramine (QUESTRAN) 4 g packet MIX AND DRINK 1 PACKET BY MOUTH DAILY 90 each 0   Cranberry 180 MG CAPS Take 2 capsules by mouth daily.     dextromethorphan-guaiFENesin (MUCINEX DM) 30-600 MG 12hr tablet Take 1 tablet by mouth 2 (two) times daily. 15 tablet 0   Fexofenadine HCl (MUCINEX ALLERGY PO) Take 1 tablet by mouth daily as needed (Allergies).      fluconazole (DIFLUCAN) 150 MG tablet Take one pill by mouth today and repeat dose in 3 days 2 tablet 0   fluticasone (FLONASE) 50 MCG/ACT nasal spray Place 1 spray into both nostrils daily for 3 days. 16 g 0   folic acid (FOLVITE) 1 MG tablet Take 1 mg by mouth daily.     levothyroxine (SYNTHROID) 88 MCG tablet Take 1 tablet (88 mcg total) by mouth daily before breakfast. 90 tablet 2   loratadine (CLARITIN) 10 MG tablet Take 10 mg by mouth daily as needed for allergies.      MAGNESIUM-OXIDE PO Take 1 tablet by mouth daily.     Methenamine-Sodium Salicylate (AZO URINARY TRACT DEFENSE PO) Take 1 capsule by mouth daily.     Multiple Vitamin (MULTIVITAMIN)  capsule Take 1 capsule by mouth daily.     Psyllium (METAMUCIL FIBER PO) Take by mouth at bedtime. 2 tablespoon     Pumpkin Seed-Soy Germ (AZO BLADDER CONTROL/GO-LESS PO) Take 1 tablet by mouth 2 (two) times a week.     solifenacin (VESICARE) 5 MG tablet TAKE 1 TABLET(5 MG) BY MOUTH DAILY 30 tablet 11   TURMERIC PO Take 1 capsule by mouth 2 (two) times daily.      vitamin B-12 (CYANOCOBALAMIN) 500 MCG tablet Take 500 mcg by mouth daily.     Vitamin D, Cholecalciferol, 50 MCG (2000 UT) CAPS Take 2 tablets by mouth daily.     No current facility-administered medications on file prior to visit.    Review of Systems  Constitutional:  Negative for activity change, appetite change, fatigue, fever and unexpected weight change.  HENT:  Negative for congestion, ear pain, rhinorrhea, sinus pressure and sore throat.   Eyes:  Negative for pain, redness and visual disturbance.  Respiratory:  Negative for cough, shortness of breath and wheezing.   Cardiovascular:  Negative for chest pain and palpitations.  Gastrointestinal:  Negative for abdominal pain, blood in stool, constipation and diarrhea.  Endocrine: Negative for polydipsia and polyuria.  Genitourinary:  Negative for dysuria, frequency and urgency.  Musculoskeletal:  Positive for arthralgias and back pain. Negative for myalgias.  Skin:  Negative for pallor and rash.  Allergic/Immunologic: Negative for environmental allergies.  Neurological:  Negative for dizziness, syncope and headaches.  Hematological:  Negative for adenopathy. Does not bruise/bleed easily.  Psychiatric/Behavioral:  Negative for decreased concentration and dysphoric mood. The patient is not  nervous/anxious.        Objective:   Physical Exam Constitutional:      General: She is not in acute distress.    Appearance: Normal appearance. She is well-developed and normal weight. She is not ill-appearing or diaphoretic.  HENT:     Head: Normocephalic and atraumatic.  Eyes:      Conjunctiva/sclera: Conjunctivae normal.     Pupils: Pupils are equal, round, and reactive to light.  Neck:     Thyroid: No thyromegaly.     Vascular: No carotid bruit or JVD.  Cardiovascular:     Rate and Rhythm: Normal rate and regular rhythm.     Heart sounds: Normal heart sounds.     No gallop.  Pulmonary:     Effort: Pulmonary effort is normal. No respiratory distress.     Breath sounds: Normal breath sounds. No wheezing or rales.  Abdominal:     General: There is no distension or abdominal bruit.     Palpations: Abdomen is soft.  Musculoskeletal:     Cervical back: Normal range of motion and neck supple.     Right lower leg: No edema.     Left lower leg: No edema.     Comments: Mild kyphosis noted Petite frame  Lymphadenopathy:     Cervical: No cervical adenopathy.  Skin:    General: Skin is warm and dry.     Coloration: Skin is not pale.     Findings: No rash.  Neurological:     Mental Status: She is alert.     Coordination: Coordination normal.     Deep Tendon Reflexes: Reflexes are normal and symmetric. Reflexes normal.  Psychiatric:        Mood and Affect: Mood normal.           Assessment & Plan:   Problem List Items Addressed This Visit       Musculoskeletal and Integument   Osteoporosis - Primary    Recently decreased bmd Reviewed dexa from 04/2022 with T score -2.8 of foream No falls or fx Takes vit D 4000 iu daily and ca , D level is tx Hypothyroid- nl suppl  Petite frame Last ca nl Disc opt for tx- pt does not remember trying alendronate and may not have taken it? Will try weekly alendronate-rev poss side eff to update If tolerated will do 5 y  If not tolerated may be candidate for prolia  Disc need for calcium/ vitamin D/ wt bearing exercise and bone density test every 2 y to monitor Disc safety/ fracture risk in detail   Would like more exercise, PT ordered for this and chronic back pain       Relevant Medications   alendronate  (FOSAMAX) 70 MG tablet   Other Relevant Orders   Ambulatory referral to Physical Therapy     Other   Chronic low back pain   Relevant Orders   Ambulatory referral to Physical Therapy   Chronic upper back pain   Relevant Orders   Ambulatory referral to Physical Therapy   Vitamin D deficiency    Vitamin D level is therapeutic with current supplementation Disc importance of this to bone and overall health       Other Visit Diagnoses     Need for influenza vaccination       Relevant Orders   Flu Vaccine QUAD High Dose(Fluad) (Completed)

## 2022-05-16 NOTE — Assessment & Plan Note (Signed)
Recently decreased bmd Reviewed dexa from 04/2022 with T score -2.8 of foream No falls or fx Takes vit D 4000 iu daily and ca , D level is tx Hypothyroid- nl suppl  Petite frame Last ca nl Disc opt for tx- pt does not remember trying alendronate and may not have taken it? Will try weekly alendronate-rev poss side eff to update If tolerated will do 5 y  If not tolerated may be candidate for prolia  Disc need for calcium/ vitamin D/ wt bearing exercise and bone density test every 2 y to monitor Disc safety/ fracture risk in detail   Would like more exercise, PT ordered for this and chronic back pain

## 2022-05-16 NOTE — Assessment & Plan Note (Signed)
Vitamin D level is therapeutic with current supplementation Disc importance of this to bone and overall health  

## 2022-06-02 DIAGNOSIS — Z23 Encounter for immunization: Secondary | ICD-10-CM | POA: Diagnosis not present

## 2022-06-10 ENCOUNTER — Encounter: Payer: Self-pay | Admitting: Physical Therapy

## 2022-06-10 ENCOUNTER — Ambulatory Visit: Payer: Medicare Other | Attending: Family Medicine | Admitting: Physical Therapy

## 2022-06-10 DIAGNOSIS — G8929 Other chronic pain: Secondary | ICD-10-CM | POA: Insufficient documentation

## 2022-06-10 DIAGNOSIS — M62830 Muscle spasm of back: Secondary | ICD-10-CM | POA: Diagnosis not present

## 2022-06-10 DIAGNOSIS — M545 Low back pain, unspecified: Secondary | ICD-10-CM | POA: Insufficient documentation

## 2022-06-10 DIAGNOSIS — M810 Age-related osteoporosis without current pathological fracture: Secondary | ICD-10-CM | POA: Diagnosis not present

## 2022-06-10 DIAGNOSIS — M549 Dorsalgia, unspecified: Secondary | ICD-10-CM | POA: Insufficient documentation

## 2022-06-10 DIAGNOSIS — R262 Difficulty in walking, not elsewhere classified: Secondary | ICD-10-CM | POA: Diagnosis not present

## 2022-06-10 DIAGNOSIS — M5459 Other low back pain: Secondary | ICD-10-CM | POA: Insufficient documentation

## 2022-06-10 NOTE — Therapy (Signed)
OUTPATIENT PHYSICAL THERAPY THORACOLUMBAR EVALUATION   Patient Name: Linda Mcgrath MRN: [id number] DOB:04-20-1933, 88 y.o., female Today's Date: 06/10/2022   PT End of Session - 06/10/22 1253     Visit Number 1    Date for PT Re-Evaluation 09/10/22    Authorization Type [employer]    PT Start Time 1248    PT Stop Time 1348    PT Time Calculation (min) 60 min    Activity Tolerance Patient tolerated treatment well    Behavior During Therapy [facility] for tasks assessed/performed             Past Medical History:  Diagnosis Date   Allergic rhinitis    Arthritis    Asthma    Cataract    Bil/lens implant   Colon polyp 1999   small polyp   Diverticulosis    Dysrhythmia    Fibromyalgia    Hyperlipidemia    Hypothyroidism    Internal hemorrhoids    Leg cramps    Lichen planus    Lung nodule    stable LUL 9 mm   Menopausal syndrome    Osteoarthritis    UTI (urinary tract infection)    Past Surgical History:  Procedure Laterality Date   Abd U/S  11/1998   negative   Abd U/S  01/2001   gallbladder polyps   ABDOMINAL HYSTERECTOMY  1975   total-fibroids   APPENDECTOMY  1975   BREAST BIOPSY     benign   BREAST EXCISIONAL BIOPSY Left 1957   CARDIOVERSION N/A 03/07/2016   Procedure: CARDIOVERSION;  Surgeon: Yates Decamp, MD;  Location: [facility] ENDOSCOPY;  Service: Cardiovascular;  Laterality: N/A;   CATARACT EXTRACTION     bilateral   CHOLECYSTECTOMY     COLONOSCOPY  1998   Diverticulosis; polyp\   COLONOSCOPY  09/2002   Diverticulosis, hem   COLONOSCOPY  12/2007   diverticulosis, polyp   DEXA  10/2001   osteopenia   ELECTROPHYSIOLOGIC STUDY N/A 04/03/2016   Procedure: A-Flutter Ablation;  Surgeon: Will [name], MD;  Location: [facility] INVASIVE CV LAB;  Service: Cardiovascular;  Laterality: N/A;   ESOPHAGOGASTRODUODENOSCOPY  2004   Hida scan  01/2001   Negative   KNEE SURGERY     right knee / 11/2004   LAMINECTOMY WITH POSTERIOR LATERAL ARTHRODESIS LEVEL 2  N/A 07/31/2017   Procedure: DECOMPRESSIVE LAMINECTOMY LUMABR FOUR-FIVE, LUMBAR FIVE-SACRAL ONE;  Surgeon: Tia Alert, MD;  Location: [facility] OR;  Service: Neurosurgery;  Laterality: N/A;   laser surgery for glaucoma Left 09/21/2014   MULTIPLE TOOTH EXTRACTIONS     rentinal tear     ROTATOR CUFF REPAIR     x 2 /right shoulder   SKIN CANCER EXCISION     pre-melanoma / on face   TEE WITHOUT CARDIOVERSION N/A 03/07/2016   Procedure: TRANSESOPHAGEAL ECHOCARDIOGRAM (TEE);  Surgeon: Yates Decamp, MD;  Location: [facility] ENDOSCOPY;  Service: Cardiovascular;  Laterality: N/A;   TOE SURGERY     rt foot    Patient Active Problem List   Diagnosis Date Noted   Acute cystitis without hematuria 03/26/2022   Vaginal itching 03/26/2022   Urinary frequency 03/26/2022   Vaginal candida 03/26/2022   Chronic upper back pain 08/30/2020   Aortic atherosclerosis (HCC) 05/31/2020   Abnormal CT scan, small bowel 05/18/2020   Insomnia 12/15/2019   Chronic low back pain 10/18/2019   Osteoarthritis of right knee 08/02/2019   S/P lumbar spinal fusion 07/31/2017   H/O atrial flutter 03/06/2016  Obesity 11/06/2015   Urge incontinence 01/24/2015   Estrogen deficiency 10/25/2014   Lichen planus 12/20/2013   Prediabetes 06/03/2011   HYPERTENSION, BENIGN ESSENTIAL 10/23/2010   OSTEOARTHRITIS, HANDS, BILATERAL 01/24/2010   Hypothyroidism 08/24/2008   Vitamin D deficiency 06/06/2008   Hyperlipidemia 06/06/2008   MENOPAUSAL SYNDROME 03/29/2008   Osteoporosis 03/29/2008   Allergic rhinitis 01/25/2008   IBS 01/25/2008   OSTEOARTHRITIS 01/25/2008   FIBROMYALGIA 01/25/2008    PCP: [employer]  REFERRING PROVIDER: [name]  REFERRING DIAG: Back pain, difficulty walking, osteoporosis  Rationale for Evaluation and Treatment Rehabilitation  THERAPY DIAG:  Other low back pain  Difficulty in walking, not elsewhere classified  Muscle spasm of back  ONSET DATE: September 2023  SUBJECTIVE:                                                                                                                                                                                            SUBJECTIVE STATEMENT: Patient reports that she saw the MD recently and the bone density test was bad and she felt I needed to do something plus my back was hurting.  I am not walking much anymore and I have difficulty when I do it PERTINENT HISTORY:  See above  PAIN:  Are you having pain? Yes: NPRS scale: 5/10 Pain location: low back, right knee Pain description: ache, sore Aggravating factors: yardwork, hosuework pain up to 8/10 Relieving factors: rest, pain can be 0/10 at times   PRECAUTIONS: None  WEIGHT BEARING RESTRICTIONS: No  FALLS:  Has patient fallen in last 6 months? No  LIVING ENVIRONMENT: Lives with: lives with their family and lives alone Lives in: House/apartment Stairs: No Has following equipment at home: Single point cane  OCCUPATION: retired  PLOF: Independent able to do yardwork and housework  PATIENT GOALS: walk better, less pain   OBJECTIVE:   DIAGNOSTIC FINDINGS:  osteoporosis  PATIENT SURVEYS:  FOTO 31  COGNITION:  Overall cognitive status: Within functional limits for tasks assessed     SENSATION: WFL  MUSCLE LENGTH: Tight calves, HS and piriformis  POSTURE: rounded shoulders, forward head, and decreased lumbar lordosis  PALPATION: She is tight and tender in the lumbar area and into the thoracic and upper trap area, mild tenderness in the buttocks  LUMBAR ROM:   AROM eval  Flexion Decreased 50%  Extension Decreased 100% with pain  Right lateral flexion   Left lateral flexion   Right rotation   Left rotation    (Blank rows = not tested)  LOWER EXTREMITY ROM:     Active  Right eval Left eval  Hip flexion  Hip extension    Hip abduction    Hip adduction    Hip internal rotation    Hip external rotation    Knee flexion    Knee extension    Ankle dorsiflexion     Ankle plantarflexion    Ankle inversion    Ankle eversion     (Blank rows = not tested)  LOWER EXTREMITY MMT:    MMT Right eval Left eval  Hip flexion 3+ 3+  Hip extension    Hip abduction 3+ 3+  Hip adduction    Hip internal rotation    Hip external rotation    Knee flexion 3+ with pain 3+  Knee extension 3+ with pain 3+  Ankle dorsiflexion 4- 4-  Ankle plantarflexion    Ankle inversion    Ankle eversion     (Blank rows = not tested)  FUNCTIONAL TESTS:  5 times sit to stand: 31 seconds Timed up and go (TUG): 26 seconds with SPC  GAIT: Distance walked: 60 feet Assistive device utilized: Single point cane Level of assistance: SBA Comments: slow, slightly stooped    TODAY'S TREATMENT:  [facility] Adult PT Treatment:                                                                                                                            DATE:      PATIENT EDUCATION:  Education details: POC Person educated: Patient Education method: Explanation Education comprehension: verbalized understanding   HOME EXERCISE PROGRAM: TBD  ASSESSMENT:  CLINICAL IMPRESSION: Patient is a 88 y.o. female who was seen today for physical therapy evaluation and treatment for LBP and worsening osteoporosis.  She reports more and more diffiuclty walking recently.  She is slower than what we saw back earlier in the year, she is having some pain and reports that recently her osteoporosis test was "bad" getting worse, MD wants me to do more    OBJECTIVE IMPAIRMENTS: Abnormal gait, cardiopulmonary status limiting activity, decreased activity tolerance, decreased balance, decreased endurance, decreased mobility, difficulty walking, decreased ROM, decreased strength, increased muscle spasms, impaired flexibility, postural dysfunction, and pain.    REHAB POTENTIAL: Good  CLINICAL DECISION MAKING: Stable/uncomplicated  EVALUATION COMPLEXITY: Low   GOALS: Goals reviewed with patient?  Yes  SHORT TERM GOALS: Target date: 06/24/22  Independent with initial HEP Goal status: INITIAL  LONG TERM GOALS: Target date: 09/02/22  Decrease pain 50% Goal status: INITIAL  2.  Independent with an advanced HEP  Goal status: INITIAL  3.  Decrease TUG time to 13 seconds Goal status: INITIAL  4.  Increase LE strength to 4/5 Goal status: INITIAL  5.  Walk 10 minutes with SPC and without rest Goal status: INITIAL  PLAN: PT FREQUENCY: 1-2x/week  PT DURATION: 12 weeks  PLANNED INTERVENTIONS: Therapeutic exercises, Therapeutic activity, Neuromuscular re-education, Balance training, Gait training, Patient/Family education, Self Care, Joint mobilization, Stair training, Dry Needling, Electrical stimulation, Moist heat, and Manual therapy.  PLAN FOR  NEXT SESSION: slowly start exercises   Jearld Lesch, PT 06/10/2022, 12:54 PM

## 2022-06-19 ENCOUNTER — Encounter: Payer: Self-pay | Admitting: Physical Therapy

## 2022-06-19 ENCOUNTER — Ambulatory Visit: Payer: Medicare Other | Admitting: Physical Therapy

## 2022-06-19 DIAGNOSIS — M810 Age-related osteoporosis without current pathological fracture: Secondary | ICD-10-CM | POA: Diagnosis not present

## 2022-06-19 DIAGNOSIS — M62830 Muscle spasm of back: Secondary | ICD-10-CM | POA: Diagnosis not present

## 2022-06-19 DIAGNOSIS — R262 Difficulty in walking, not elsewhere classified: Secondary | ICD-10-CM

## 2022-06-19 DIAGNOSIS — M5459 Other low back pain: Secondary | ICD-10-CM

## 2022-06-19 DIAGNOSIS — M549 Dorsalgia, unspecified: Secondary | ICD-10-CM | POA: Diagnosis not present

## 2022-06-19 DIAGNOSIS — G8929 Other chronic pain: Secondary | ICD-10-CM | POA: Diagnosis not present

## 2022-06-19 NOTE — Therapy (Signed)
OUTPATIENT PHYSICAL THERAPY THORACOLUMBAR TREATMENT   Patient Name: Linda Mcgrath MRN: [id number] DOB:1933-02-26, 88 y.o., female Today's Date: 06/19/2022   PT End of Session - 06/19/22 1740     Visit Number 2    Date for PT Re-Evaluation 09/10/22    Authorization Type [employer]    PT Start Time 1738    PT Stop Time 1827    PT Time Calculation (min) 49 min    Activity Tolerance Patient tolerated treatment well    Behavior During Therapy [facility] for tasks assessed/performed             Past Medical History:  Diagnosis Date   Allergic rhinitis    Arthritis    Asthma    Cataract    Bil/lens implant   Colon polyp 1999   small polyp   Diverticulosis    Dysrhythmia    Fibromyalgia    Hyperlipidemia    Hypothyroidism    Internal hemorrhoids    Leg cramps    Lichen planus    Lung nodule    stable LUL 9 mm   Menopausal syndrome    Osteoarthritis    UTI (urinary tract infection)    Past Surgical History:  Procedure Laterality Date   Abd U/S  11/1998   negative   Abd U/S  01/2001   gallbladder polyps   ABDOMINAL HYSTERECTOMY  1975   total-fibroids   APPENDECTOMY  1975   BREAST BIOPSY     benign   BREAST EXCISIONAL BIOPSY Left 1957   CARDIOVERSION N/A 03/07/2016   Procedure: CARDIOVERSION;  Surgeon: Yates Decamp, MD;  Location: [facility] ENDOSCOPY;  Service: Cardiovascular;  Laterality: N/A;   CATARACT EXTRACTION     bilateral   CHOLECYSTECTOMY     COLONOSCOPY  1998   Diverticulosis; polyp\   COLONOSCOPY  09/2002   Diverticulosis, hem   COLONOSCOPY  12/2007   diverticulosis, polyp   DEXA  10/2001   osteopenia   ELECTROPHYSIOLOGIC STUDY N/A 04/03/2016   Procedure: A-Flutter Ablation;  Surgeon: Will [name], MD;  Location: [facility] INVASIVE CV LAB;  Service: Cardiovascular;  Laterality: N/A;   ESOPHAGOGASTRODUODENOSCOPY  2004   Hida scan  01/2001   Negative   KNEE SURGERY     right knee / 11/2004   LAMINECTOMY WITH POSTERIOR LATERAL ARTHRODESIS LEVEL 2  N/A 07/31/2017   Procedure: DECOMPRESSIVE LAMINECTOMY LUMABR FOUR-FIVE, LUMBAR FIVE-SACRAL ONE;  Surgeon: Tia Alert, MD;  Location: [facility] OR;  Service: Neurosurgery;  Laterality: N/A;   laser surgery for glaucoma Left 09/21/2014   MULTIPLE TOOTH EXTRACTIONS     rentinal tear     ROTATOR CUFF REPAIR     x 2 /right shoulder   SKIN CANCER EXCISION     pre-melanoma / on face   TEE WITHOUT CARDIOVERSION N/A 03/07/2016   Procedure: TRANSESOPHAGEAL ECHOCARDIOGRAM (TEE);  Surgeon: Yates Decamp, MD;  Location: [facility] ENDOSCOPY;  Service: Cardiovascular;  Laterality: N/A;   TOE SURGERY     rt foot    Patient Active Problem List   Diagnosis Date Noted   Acute cystitis without hematuria 03/26/2022   Vaginal itching 03/26/2022   Urinary frequency 03/26/2022   Vaginal candida 03/26/2022   Chronic upper back pain 08/30/2020   Aortic atherosclerosis (HCC) 05/31/2020   Abnormal CT scan, small bowel 05/18/2020   Insomnia 12/15/2019   Chronic low back pain 10/18/2019   Osteoarthritis of right knee 08/02/2019   S/P lumbar spinal fusion 07/31/2017   H/O atrial flutter 03/06/2016  Obesity 11/06/2015   Urge incontinence 01/24/2015   Estrogen deficiency 10/25/2014   Lichen planus 12/20/2013   Prediabetes 06/03/2011   HYPERTENSION, BENIGN ESSENTIAL 10/23/2010   OSTEOARTHRITIS, HANDS, BILATERAL 01/24/2010   Hypothyroidism 08/24/2008   Vitamin D deficiency 06/06/2008   Hyperlipidemia 06/06/2008   MENOPAUSAL SYNDROME 03/29/2008   Osteoporosis 03/29/2008   Allergic rhinitis 01/25/2008   IBS 01/25/2008   OSTEOARTHRITIS 01/25/2008   FIBROMYALGIA 01/25/2008    PCP: [employer]  REFERRING PROVIDER: [name]  REFERRING DIAG: Back pain, difficulty walking, osteoporosis  Rationale for Evaluation and Treatment Rehabilitation  THERAPY DIAG:  Other low back pain  Difficulty in walking, not elsewhere classified  Muscle spasm of back  ONSET DATE: September 2023  SUBJECTIVE:                                                                                                                                                                                            SUBJECTIVE STATEMENT: Patient reports no issues since the evaluaation, does have some back and knee pain PERTINENT HISTORY:  See above  PAIN:  Are you having pain? Yes: NPRS scale: 5/10 Pain location: low back, right knee Pain description: ache, sore Aggravating factors: yardwork, hosuework pain up to 8/10 Relieving factors: rest, pain can be 0/10 at times   PRECAUTIONS: None  WEIGHT BEARING RESTRICTIONS: No  FALLS:  Has patient fallen in last 6 months? No  LIVING ENVIRONMENT: Lives with: lives with their family and lives alone Lives in: House/apartment Stairs: No Has following equipment at home: Single point cane  OCCUPATION: retired  PLOF: Independent able to do yardwork and housework  PATIENT GOALS: walk better, less pain   OBJECTIVE:   DIAGNOSTIC FINDINGS:  osteoporosis  PATIENT SURVEYS:  FOTO 31  COGNITION:  Overall cognitive status: Within functional limits for tasks assessed     SENSATION: WFL  MUSCLE LENGTH: Tight calves, HS and piriformis  POSTURE: rounded shoulders, forward head, and decreased lumbar lordosis  PALPATION: She is tight and tender in the lumbar area and into the thoracic and upper trap area, mild tenderness in the buttocks  LUMBAR ROM:   AROM eval  Flexion Decreased 50%  Extension Decreased 100% with pain  Right lateral flexion   Left lateral flexion   Right rotation   Left rotation    (Blank rows = not tested)  LOWER EXTREMITY ROM:     Active  Right eval Left eval  Hip flexion    Hip extension    Hip abduction    Hip adduction    Hip internal rotation    Hip external rotation    Knee flexion  Knee extension    Ankle dorsiflexion    Ankle plantarflexion    Ankle inversion    Ankle eversion     (Blank rows = not tested)  LOWER EXTREMITY MMT:    MMT  Right eval Left eval  Hip flexion 3+ 3+  Hip extension    Hip abduction 3+ 3+  Hip adduction    Hip internal rotation    Hip external rotation    Knee flexion 3+ with pain 3+  Knee extension 3+ with pain 3+  Ankle dorsiflexion 4- 4-  Ankle plantarflexion    Ankle inversion    Ankle eversion     (Blank rows = not tested)  FUNCTIONAL TESTS:  5 times sit to stand: 31 seconds Timed up and go (TUG): 26 seconds with SPC  GAIT: Distance walked: 60 feet Assistive device utilized: Single point cane Level of assistance: SBA Comments: slow, slightly stooped    TODAY'S TREATMENT:  [facility] Adult PT Treatment:                                                                                                                            DATE: 06/19/22 Nustep level 5 x 6 mintues 1.5# LAQ Red tband HS curls Red tband ankle exercises Red tband clamshells Ball b/n knees squeeze Yellow tband rows and extension STM to the low back in sitting     PATIENT EDUCATION:  Education details: POC Person educated: Patient Education method: Explanation Education comprehension: verbalized understanding   HOME EXERCISE PROGRAM: TBD  ASSESSMENT:  CLINICAL IMPRESSION: Initiated gym exercises, she did well, some knee pain, she is very tender in the right low back and hip area seems to be mms.  Has difficulty with arm activities due to hands and shoulders  OBJECTIVE IMPAIRMENTS: Abnormal gait, cardiopulmonary status limiting activity, decreased activity tolerance, decreased balance, decreased endurance, decreased mobility, difficulty walking, decreased ROM, decreased strength, increased muscle spasms, impaired flexibility, postural dysfunction, and pain.    REHAB POTENTIAL: Good  CLINICAL DECISION MAKING: Stable/uncomplicated  EVALUATION COMPLEXITY: Low   GOALS: Goals reviewed with patient? Yes  SHORT TERM GOALS: Target date: 06/24/22  Independent with initial HEP Goal status:  INITIAL  LONG TERM GOALS: Target date: 09/02/22  Decrease pain 50% Goal status: INITIAL  2.  Independent with an advanced HEP  Goal status: INITIAL  3.  Decrease TUG time to 13 seconds Goal status: INITIAL  4.  Increase LE strength to 4/5 Goal status: INITIAL  5.  Walk 10 minutes with SPC and without rest Goal status: INITIAL  PLAN: PT FREQUENCY: 1-2x/week  PT DURATION: 12 weeks  PLANNED INTERVENTIONS: Therapeutic exercises, Therapeutic activity, Neuromuscular re-education, Balance training, Gait training, Patient/Family education, Self Care, Joint mobilization, Stair training, Dry Needling, Electrical stimulation, Moist heat, and Manual therapy.  PLAN FOR NEXT SESSION: slowly start exercises   Jearld Lesch, PT 06/19/2022, 5:43 PM

## 2022-06-26 ENCOUNTER — Encounter: Payer: Self-pay | Admitting: Physical Therapy

## 2022-06-26 ENCOUNTER — Ambulatory Visit: Payer: Medicare Other | Attending: Family Medicine | Admitting: Physical Therapy

## 2022-06-26 DIAGNOSIS — M5459 Other low back pain: Secondary | ICD-10-CM | POA: Insufficient documentation

## 2022-06-26 DIAGNOSIS — R262 Difficulty in walking, not elsewhere classified: Secondary | ICD-10-CM | POA: Insufficient documentation

## 2022-06-26 DIAGNOSIS — M542 Cervicalgia: Secondary | ICD-10-CM | POA: Diagnosis not present

## 2022-06-26 DIAGNOSIS — M62830 Muscle spasm of back: Secondary | ICD-10-CM | POA: Insufficient documentation

## 2022-06-26 NOTE — Therapy (Signed)
OUTPATIENT PHYSICAL THERAPY THORACOLUMBAR TREATMENT   Patient Name: Linda Mcgrath MRN: [id number] DOB:June 22, 1933, 88 y.o., female Today's Date: 06/26/2022   PT End of Session - 06/26/22 0800     Visit Number 3    Date for PT Re-Evaluation 09/10/22    Authorization Type [employer]    PT Start Time 0755    PT Stop Time 0843    PT Time Calculation (min) 48 min    Activity Tolerance Patient tolerated treatment well    Behavior During Therapy [facility] for tasks assessed/performed             Past Medical History:  Diagnosis Date   Allergic rhinitis    Arthritis    Asthma    Cataract    Bil/lens implant   Colon polyp 1999   small polyp   Diverticulosis    Dysrhythmia    Fibromyalgia    Hyperlipidemia    Hypothyroidism    Internal hemorrhoids    Leg cramps    Lichen planus    Lung nodule    stable LUL 9 mm   Menopausal syndrome    Osteoarthritis    UTI (urinary tract infection)    Past Surgical History:  Procedure Laterality Date   Abd U/S  11/1998   negative   Abd U/S  01/2001   gallbladder polyps   ABDOMINAL HYSTERECTOMY  1975   total-fibroids   APPENDECTOMY  1975   BREAST BIOPSY     benign   BREAST EXCISIONAL BIOPSY Left 1957   CARDIOVERSION N/A 03/07/2016   Procedure: CARDIOVERSION;  Surgeon: Yates Decamp, MD;  Location: [facility] ENDOSCOPY;  Service: Cardiovascular;  Laterality: N/A;   CATARACT EXTRACTION     bilateral   CHOLECYSTECTOMY     COLONOSCOPY  1998   Diverticulosis; polyp\   COLONOSCOPY  09/2002   Diverticulosis, hem   COLONOSCOPY  12/2007   diverticulosis, polyp   DEXA  10/2001   osteopenia   ELECTROPHYSIOLOGIC STUDY N/A 04/03/2016   Procedure: A-Flutter Ablation;  Surgeon: Will [name], MD;  Location: [facility] INVASIVE CV LAB;  Service: Cardiovascular;  Laterality: N/A;   ESOPHAGOGASTRODUODENOSCOPY  2004   Hida scan  01/2001   Negative   KNEE SURGERY     right knee / 11/2004   LAMINECTOMY WITH POSTERIOR LATERAL ARTHRODESIS LEVEL 2 N/A  07/31/2017   Procedure: DECOMPRESSIVE LAMINECTOMY LUMABR FOUR-FIVE, LUMBAR FIVE-SACRAL ONE;  Surgeon: Tia Alert, MD;  Location: [facility] OR;  Service: Neurosurgery;  Laterality: N/A;   laser surgery for glaucoma Left 09/21/2014   MULTIPLE TOOTH EXTRACTIONS     rentinal tear     ROTATOR CUFF REPAIR     x 2 /right shoulder   SKIN CANCER EXCISION     pre-melanoma / on face   TEE WITHOUT CARDIOVERSION N/A 03/07/2016   Procedure: TRANSESOPHAGEAL ECHOCARDIOGRAM (TEE);  Surgeon: Yates Decamp, MD;  Location: [facility] ENDOSCOPY;  Service: Cardiovascular;  Laterality: N/A;   TOE SURGERY     rt foot    Patient Active Problem List   Diagnosis Date Noted   Acute cystitis without hematuria 03/26/2022   Vaginal itching 03/26/2022   Urinary frequency 03/26/2022   Vaginal candida 03/26/2022   Chronic upper back pain 08/30/2020   Aortic atherosclerosis (HCC) 05/31/2020   Abnormal CT scan, small bowel 05/18/2020   Insomnia 12/15/2019   Chronic low back pain 10/18/2019   Osteoarthritis of right knee 08/02/2019   S/P lumbar spinal fusion 07/31/2017   H/O atrial flutter 03/06/2016  Obesity 11/06/2015   Urge incontinence 01/24/2015   Estrogen deficiency 10/25/2014   Lichen planus 12/20/2013   Prediabetes 06/03/2011   HYPERTENSION, BENIGN ESSENTIAL 10/23/2010   OSTEOARTHRITIS, HANDS, BILATERAL 01/24/2010   Hypothyroidism 08/24/2008   Vitamin D deficiency 06/06/2008   Hyperlipidemia 06/06/2008   MENOPAUSAL SYNDROME 03/29/2008   Osteoporosis 03/29/2008   Allergic rhinitis 01/25/2008   IBS 01/25/2008   OSTEOARTHRITIS 01/25/2008   FIBROMYALGIA 01/25/2008    PCP: [employer]  REFERRING PROVIDER: [name]  REFERRING DIAG: Back pain, difficulty walking, osteoporosis  Rationale for Evaluation and Treatment Rehabilitation  THERAPY DIAG:  Other low back pain  Difficulty in walking, not elsewhere classified  Muscle spasm of back  Cervicalgia  ONSET DATE: September 2023  SUBJECTIVE:                                                                                                                                                                                            SUBJECTIVE STATEMENT: Patient reports that every night she has cramps in the calves at least 2 x and she has to get up each time for about 30 minutes and walk PERTINENT HISTORY:  See above  PAIN:  Are you having pain? Yes: NPRS scale: 5/10 Pain location: low back, right knee Pain description: ache, sore Aggravating factors: yardwork, hosuework pain up to 8/10 Relieving factors: rest, pain can be 0/10 at times   PRECAUTIONS: None  WEIGHT BEARING RESTRICTIONS: No  FALLS:  Has patient fallen in last 6 months? No  LIVING ENVIRONMENT: Lives with: lives with their family and lives alone Lives in: House/apartment Stairs: No Has following equipment at home: Single point cane  OCCUPATION: retired  PLOF: Independent able to do yardwork and housework  PATIENT GOALS: walk better, less pain   OBJECTIVE:   DIAGNOSTIC FINDINGS:  osteoporosis  PATIENT SURVEYS:  FOTO 31  COGNITION:  Overall cognitive status: Within functional limits for tasks assessed     SENSATION: WFL  MUSCLE LENGTH: Tight calves, HS and piriformis  POSTURE: rounded shoulders, forward head, and decreased lumbar lordosis  PALPATION: She is tight and tender in the lumbar area and into the thoracic and upper trap area, mild tenderness in the buttocks  LUMBAR ROM:   AROM eval  Flexion Decreased 50%  Extension Decreased 100% with pain  Right lateral flexion   Left lateral flexion   Right rotation   Left rotation    (Blank rows = not tested)  LOWER EXTREMITY ROM:     Active  Right eval Left eval  Hip flexion    Hip extension    Hip abduction    Hip adduction  Hip internal rotation    Hip external rotation    Knee flexion    Knee extension    Ankle dorsiflexion    Ankle plantarflexion    Ankle inversion    Ankle eversion      (Blank rows = not tested)  LOWER EXTREMITY MMT:    MMT Right eval Left eval  Hip flexion 3+ 3+  Hip extension    Hip abduction 3+ 3+  Hip adduction    Hip internal rotation    Hip external rotation    Knee flexion 3+ with pain 3+  Knee extension 3+ with pain 3+  Ankle dorsiflexion 4- 4-  Ankle plantarflexion    Ankle inversion    Ankle eversion     (Blank rows = not tested)  FUNCTIONAL TESTS:  5 times sit to stand: 31 seconds Timed up and go (TUG): 26 seconds with SPC  GAIT: Distance walked: 60 feet Assistive device utilized: Single point cane Level of assistance: SBA Comments: slow, slightly stooped    TODAY'S TREATMENT:  [facility] Adult PT Treatment:                                                                                                                             06/26/22 Nustep level 5 x 6 minutes Calf stretches on block and passive calf stretches while sitting 1.5# LAQ 2x10 Red tband ankle exercises Red tband HS curl Red tband clamshells Ball squeeze 1.5# marches sitting 1.5# hip abduction standing STM to the low back and gentle to the calves  DATE: 06/19/22 Nustep level 5 x 6 mintues 1.5# LAQ Red tband HS curls Red tband ankle exercises Red tband clamshells Ball b/n knees squeeze Yellow tband rows and extension STM to the low back in sitting     PATIENT EDUCATION:  Education details: POC Person educated: Patient Education method: Explanation Education comprehension: verbalized understanding   HOME EXERCISE PROGRAM: TBD  ASSESSMENT:  CLINICAL IMPRESSION: Patient c/o cramping in the calves every night, wakes her up 2x/night.  She has tenderness in the right low back and hip with STM, she has difficulty with grasping things due to pain in her hands from arthritis  OBJECTIVE IMPAIRMENTS: Abnormal gait, cardiopulmonary status limiting activity, decreased activity tolerance, decreased balance, decreased endurance, decreased mobility,  difficulty walking, decreased ROM, decreased strength, increased muscle spasms, impaired flexibility, postural dysfunction, and pain.    REHAB POTENTIAL: Good  CLINICAL DECISION MAKING: Stable/uncomplicated  EVALUATION COMPLEXITY: Low   GOALS: Goals reviewed with patient? Yes  SHORT TERM GOALS: Target date: 06/24/22  Independent with initial HEP Goal status: INITIAL  LONG TERM GOALS: Target date: 09/02/22  Decrease pain 50% Goal status: INITIAL  2.  Independent with an advanced HEP  Goal status: INITIAL  3.  Decrease TUG time to 13 seconds Goal status: INITIAL  4.  Increase LE strength to 4/5 Goal status: INITIAL  5.  Walk 10 minutes with SPC and without rest Goal status:  INITIAL  PLAN: PT FREQUENCY: 1-2x/week  PT DURATION: 12 weeks  PLANNED INTERVENTIONS: Therapeutic exercises, Therapeutic activity, Neuromuscular re-education, Balance training, Gait training, Patient/Family education, Self Care, Joint mobilization, Stair training, Dry Needling, Electrical stimulation, Moist heat, and Manual therapy.  PLAN FOR NEXT SESSION: slowly start exercises   Amarian Botero W, PT 06/26/2022, 8:01 AM

## 2022-07-02 ENCOUNTER — Encounter: Payer: Self-pay | Admitting: Physical Therapy

## 2022-07-02 ENCOUNTER — Ambulatory Visit: Payer: Medicare Other | Admitting: Physical Therapy

## 2022-07-02 DIAGNOSIS — M5459 Other low back pain: Secondary | ICD-10-CM | POA: Diagnosis not present

## 2022-07-02 DIAGNOSIS — M542 Cervicalgia: Secondary | ICD-10-CM

## 2022-07-02 DIAGNOSIS — M62830 Muscle spasm of back: Secondary | ICD-10-CM | POA: Diagnosis not present

## 2022-07-02 DIAGNOSIS — R262 Difficulty in walking, not elsewhere classified: Secondary | ICD-10-CM | POA: Diagnosis not present

## 2022-07-02 NOTE — Therapy (Signed)
OUTPATIENT PHYSICAL THERAPY THORACOLUMBAR TREATMENT   Patient Name: Linda Mcgrath MRN: [id number] DOB:08-23-1933, 88 y.o., female Today's Date: 07/02/2022   PT End of Session - 07/02/22 1253     Visit Number 4    Date for PT Re-Evaluation 09/10/22    Authorization Type [employer]    PT Start Time 1252    PT Stop Time 1350    PT Time Calculation (min) 58 min    Activity Tolerance Patient tolerated treatment well    Behavior During Therapy [facility] for tasks assessed/performed             Past Medical History:  Diagnosis Date   Allergic rhinitis    Arthritis    Asthma    Cataract    Bil/lens implant   Colon polyp 1999   small polyp   Diverticulosis    Dysrhythmia    Fibromyalgia    Hyperlipidemia    Hypothyroidism    Internal hemorrhoids    Leg cramps    Lichen planus    Lung nodule    stable LUL 9 mm   Menopausal syndrome    Osteoarthritis    UTI (urinary tract infection)    Past Surgical History:  Procedure Laterality Date   Abd U/S  11/1998   negative   Abd U/S  01/2001   gallbladder polyps   ABDOMINAL HYSTERECTOMY  1975   total-fibroids   APPENDECTOMY  1975   BREAST BIOPSY     benign   BREAST EXCISIONAL BIOPSY Left 1957   CARDIOVERSION N/A 03/07/2016   Procedure: CARDIOVERSION;  Surgeon: Yates Decamp, MD;  Location: [facility] ENDOSCOPY;  Service: Cardiovascular;  Laterality: N/A;   CATARACT EXTRACTION     bilateral   CHOLECYSTECTOMY     COLONOSCOPY  1998   Diverticulosis; polyp\   COLONOSCOPY  09/2002   Diverticulosis, hem   COLONOSCOPY  12/2007   diverticulosis, polyp   DEXA  10/2001   osteopenia   ELECTROPHYSIOLOGIC STUDY N/A 04/03/2016   Procedure: A-Flutter Ablation;  Surgeon: Will [name], MD;  Location: [facility] INVASIVE CV LAB;  Service: Cardiovascular;  Laterality: N/A;   ESOPHAGOGASTRODUODENOSCOPY  2004   Hida scan  01/2001   Negative   KNEE SURGERY     right knee / 11/2004   LAMINECTOMY WITH POSTERIOR LATERAL ARTHRODESIS LEVEL 2 N/A  07/31/2017   Procedure: DECOMPRESSIVE LAMINECTOMY LUMABR FOUR-FIVE, LUMBAR FIVE-SACRAL ONE;  Surgeon: Tia Alert, MD;  Location: [facility] OR;  Service: Neurosurgery;  Laterality: N/A;   laser surgery for glaucoma Left 09/21/2014   MULTIPLE TOOTH EXTRACTIONS     rentinal tear     ROTATOR CUFF REPAIR     x 2 /right shoulder   SKIN CANCER EXCISION     pre-melanoma / on face   TEE WITHOUT CARDIOVERSION N/A 03/07/2016   Procedure: TRANSESOPHAGEAL ECHOCARDIOGRAM (TEE);  Surgeon: Yates Decamp, MD;  Location: [facility] ENDOSCOPY;  Service: Cardiovascular;  Laterality: N/A;   TOE SURGERY     rt foot    Patient Active Problem List   Diagnosis Date Noted   Acute cystitis without hematuria 03/26/2022   Vaginal itching 03/26/2022   Urinary frequency 03/26/2022   Vaginal candida 03/26/2022   Chronic upper back pain 08/30/2020   Aortic atherosclerosis (HCC) 05/31/2020   Abnormal CT scan, small bowel 05/18/2020   Insomnia 12/15/2019   Chronic low back pain 10/18/2019   Osteoarthritis of right knee 08/02/2019   S/P lumbar spinal fusion 07/31/2017   H/O atrial flutter 03/06/2016  Obesity 11/06/2015   Urge incontinence 01/24/2015   Estrogen deficiency 10/25/2014   Lichen planus 12/20/2013   Prediabetes 06/03/2011   HYPERTENSION, BENIGN ESSENTIAL 10/23/2010   OSTEOARTHRITIS, HANDS, BILATERAL 01/24/2010   Hypothyroidism 08/24/2008   Vitamin D deficiency 06/06/2008   Hyperlipidemia 06/06/2008   MENOPAUSAL SYNDROME 03/29/2008   Osteoporosis 03/29/2008   Allergic rhinitis 01/25/2008   IBS 01/25/2008   OSTEOARTHRITIS 01/25/2008   FIBROMYALGIA 01/25/2008    PCP: [employer]  REFERRING PROVIDER: [name]  REFERRING DIAG: Back pain, difficulty walking, osteoporosis  Rationale for Evaluation and Treatment Rehabilitation  THERAPY DIAG:  Other low back pain  Difficulty in walking, not elsewhere classified  Muscle spasm of back  Cervicalgia  ONSET DATE: September 2023  SUBJECTIVE:                                                                                                                                                                                            SUBJECTIVE STATEMENT: Feeling a little under the weather "sinuses", moving okay recently  PERTINENT HISTORY:  See above  PAIN:  Are you having pain? Yes: NPRS scale: 4/10 Pain location: low back, right knee Pain description: ache, sore Aggravating factors: yardwork, hosuework pain up to 8/10 Relieving factors: rest, pain can be 0/10 at times   PRECAUTIONS: None  WEIGHT BEARING RESTRICTIONS: No  FALLS:  Has patient fallen in last 6 months? No  LIVING ENVIRONMENT: Lives with: lives with their family and lives alone Lives in: House/apartment Stairs: No Has following equipment at home: Single point cane  OCCUPATION: retired  PLOF: Independent able to do yardwork and housework  PATIENT GOALS: walk better, less pain   OBJECTIVE:   DIAGNOSTIC FINDINGS:  osteoporosis  PATIENT SURVEYS:  FOTO 31  COGNITION:  Overall cognitive status: Within functional limits for tasks assessed     SENSATION: WFL  MUSCLE LENGTH: Tight calves, HS and piriformis  POSTURE: rounded shoulders, forward head, and decreased lumbar lordosis  PALPATION: She is tight and tender in the lumbar area and into the thoracic and upper trap area, mild tenderness in the buttocks  LUMBAR ROM:   AROM eval  Flexion Decreased 50%  Extension Decreased 100% with pain  Right lateral flexion   Left lateral flexion   Right rotation   Left rotation    (Blank rows = not tested)  LOWER EXTREMITY ROM:     Active  Right eval Left eval  Hip flexion    Hip extension    Hip abduction    Hip adduction    Hip internal rotation    Hip external rotation    Knee flexion  Knee extension    Ankle dorsiflexion    Ankle plantarflexion    Ankle inversion    Ankle eversion     (Blank rows = not tested)  LOWER EXTREMITY MMT:    MMT  Right eval Left eval  Hip flexion 3+ 3+  Hip extension    Hip abduction 3+ 3+  Hip adduction    Hip internal rotation    Hip external rotation    Knee flexion 3+ with pain 3+  Knee extension 3+ with pain 3+  Ankle dorsiflexion 4- 4-  Ankle plantarflexion    Ankle inversion    Ankle eversion     (Blank rows = not tested)  FUNCTIONAL TESTS:  5 times sit to stand: 31 seconds Timed up and go (TUG): 26 seconds with SPC  GAIT: Distance walked: 60 feet Assistive device utilized: Single point cane Level of assistance: SBA Comments: slow, slightly stooped    TODAY'S TREATMENT:  [facility] Adult PT Treatment:                                                                                                                             07/02/22 Nu step level 5 x 6 minutes LAQ 1.5# 2x10 1.5# marches in sitting 1.5# hip abduction and extension Green tband HS curls STM to the back and the calves and feet and into the back Sit fit ankle motions sitting   06/26/22 Nustep level 5 x 6 minutes Calf stretches on block and passive calf stretches while sitting 1.5# LAQ 2x10 Red tband ankle exercises Red tband HS curl Red tband clamshells Ball squeeze 1.5# marches sitting 1.5# hip abduction standing STM to the low back and gentle to the calves  DATE: 06/19/22 Nustep level 5 x 6 mintues 1.5# LAQ Red tband HS curls Red tband ankle exercises Red tband clamshells Ball b/n knees squeeze Yellow tband rows and extension STM to the low back in sitting     PATIENT EDUCATION:  Education details: POC Person educated: Patient Education method: Explanation Education comprehension: verbalized understanding   HOME EXERCISE PROGRAM: TBD  ASSESSMENT:  CLINICAL IMPRESSION: Patient continues to c/o cramping in the calves every night, wakes her up 2x/night with some toe cramping and spasms.  I added some STM to the calves and the feet, some PROM of the toes, she is very stiff in the toes and  the mid foot  OBJECTIVE IMPAIRMENTS: Abnormal gait, cardiopulmonary status limiting activity, decreased activity tolerance, decreased balance, decreased endurance, decreased mobility, difficulty walking, decreased ROM, decreased strength, increased muscle spasms, impaired flexibility, postural dysfunction, and pain.    REHAB POTENTIAL: Good  CLINICAL DECISION MAKING: Stable/uncomplicated  EVALUATION COMPLEXITY: Low   GOALS: Goals reviewed with patient? Yes  SHORT TERM GOALS: Target date: 06/24/22  Independent with initial HEP Goal status: partially met  LONG TERM GOALS: Target date: 09/02/22  Decrease pain 50% Goal status: INITIAL  2.  Independent with an advanced HEP  Goal status: INITIAL  3.  Decrease TUG time to 13 seconds Goal status: INITIAL  4.  Increase LE strength to 4/5 Goal status: INITIAL  5.  Walk 10 minutes with SPC and without rest Goal status: INITIAL  PLAN: PT FREQUENCY: 1-2x/week  PT DURATION: 12 weeks  PLANNED INTERVENTIONS: Therapeutic exercises, Therapeutic activity, Neuromuscular re-education, Balance training, Gait training, Patient/Family education, Self Care, Joint mobilization, Stair training, Dry Needling, Electrical stimulation, Moist heat, and Manual therapy.  PLAN FOR NEXT SESSION: slowly start exercises   Jearld Lesch, PT 07/02/2022, 12:54 PM

## 2022-07-04 ENCOUNTER — Encounter: Payer: Self-pay | Admitting: Physical Therapy

## 2022-07-04 ENCOUNTER — Ambulatory Visit: Payer: Medicare Other | Admitting: Physical Therapy

## 2022-07-04 DIAGNOSIS — R262 Difficulty in walking, not elsewhere classified: Secondary | ICD-10-CM | POA: Diagnosis not present

## 2022-07-04 DIAGNOSIS — M542 Cervicalgia: Secondary | ICD-10-CM

## 2022-07-04 DIAGNOSIS — M5459 Other low back pain: Secondary | ICD-10-CM | POA: Diagnosis not present

## 2022-07-04 DIAGNOSIS — M62830 Muscle spasm of back: Secondary | ICD-10-CM | POA: Diagnosis not present

## 2022-07-04 NOTE — Therapy (Signed)
OUTPATIENT PHYSICAL THERAPY THORACOLUMBAR TREATMENT   Patient Name: Linda Mcgrath MRN: [id number] DOB:02-06-1933, 88 y.o., female Today's Date: 07/04/2022   PT End of Session - 07/04/22 0830     Visit Number 5    Date for PT Re-Evaluation 09/10/22    Authorization Type [employer]    PT Start Time 0828    PT Stop Time 0915    PT Time Calculation (min) 47 min    Activity Tolerance Patient tolerated treatment well    Behavior During Therapy [facility] for tasks assessed/performed             Past Medical History:  Diagnosis Date   Allergic rhinitis    Arthritis    Asthma    Cataract    Bil/lens implant   Colon polyp 1999   small polyp   Diverticulosis    Dysrhythmia    Fibromyalgia    Hyperlipidemia    Hypothyroidism    Internal hemorrhoids    Leg cramps    Lichen planus    Lung nodule    stable LUL 9 mm   Menopausal syndrome    Osteoarthritis    UTI (urinary tract infection)    Past Surgical History:  Procedure Laterality Date   Abd U/S  11/1998   negative   Abd U/S  01/2001   gallbladder polyps   ABDOMINAL HYSTERECTOMY  1975   total-fibroids   APPENDECTOMY  1975   BREAST BIOPSY     benign   BREAST EXCISIONAL BIOPSY Left 1957   CARDIOVERSION N/A 03/07/2016   Procedure: CARDIOVERSION;  Surgeon: Yates Decamp, MD;  Location: [facility] ENDOSCOPY;  Service: Cardiovascular;  Laterality: N/A;   CATARACT EXTRACTION     bilateral   CHOLECYSTECTOMY     COLONOSCOPY  1998   Diverticulosis; polyp\   COLONOSCOPY  09/2002   Diverticulosis, hem   COLONOSCOPY  12/2007   diverticulosis, polyp   DEXA  10/2001   osteopenia   ELECTROPHYSIOLOGIC STUDY N/A 04/03/2016   Procedure: A-Flutter Ablation;  Surgeon: Will [name], MD;  Location: [facility] INVASIVE CV LAB;  Service: Cardiovascular;  Laterality: N/A;   ESOPHAGOGASTRODUODENOSCOPY  2004   Hida scan  01/2001   Negative   KNEE SURGERY     right knee / 11/2004   LAMINECTOMY WITH POSTERIOR LATERAL ARTHRODESIS LEVEL 2 N/A  07/31/2017   Procedure: DECOMPRESSIVE LAMINECTOMY LUMABR FOUR-FIVE, LUMBAR FIVE-SACRAL ONE;  Surgeon: Tia Alert, MD;  Location: [facility] OR;  Service: Neurosurgery;  Laterality: N/A;   laser surgery for glaucoma Left 09/21/2014   MULTIPLE TOOTH EXTRACTIONS     rentinal tear     ROTATOR CUFF REPAIR     x 2 /right shoulder   SKIN CANCER EXCISION     pre-melanoma / on face   TEE WITHOUT CARDIOVERSION N/A 03/07/2016   Procedure: TRANSESOPHAGEAL ECHOCARDIOGRAM (TEE);  Surgeon: Yates Decamp, MD;  Location: [facility] ENDOSCOPY;  Service: Cardiovascular;  Laterality: N/A;   TOE SURGERY     rt foot    Patient Active Problem List   Diagnosis Date Noted   Acute cystitis without hematuria 03/26/2022   Vaginal itching 03/26/2022   Urinary frequency 03/26/2022   Vaginal candida 03/26/2022   Chronic upper back pain 08/30/2020   Aortic atherosclerosis (HCC) 05/31/2020   Abnormal CT scan, small bowel 05/18/2020   Insomnia 12/15/2019   Chronic low back pain 10/18/2019   Osteoarthritis of right knee 08/02/2019   S/P lumbar spinal fusion 07/31/2017   H/O atrial flutter 03/06/2016  Obesity 11/06/2015   Urge incontinence 01/24/2015   Estrogen deficiency 10/25/2014   Lichen planus 12/20/2013   Prediabetes 06/03/2011   HYPERTENSION, BENIGN ESSENTIAL 10/23/2010   OSTEOARTHRITIS, HANDS, BILATERAL 01/24/2010   Hypothyroidism 08/24/2008   Vitamin D deficiency 06/06/2008   Hyperlipidemia 06/06/2008   MENOPAUSAL SYNDROME 03/29/2008   Osteoporosis 03/29/2008   Allergic rhinitis 01/25/2008   IBS 01/25/2008   OSTEOARTHRITIS 01/25/2008   FIBROMYALGIA 01/25/2008    PCP: [employer]  REFERRING PROVIDER: [name]  REFERRING DIAG: Back pain, difficulty walking, osteoporosis  Rationale for Evaluation and Treatment Rehabilitation  THERAPY DIAG:  Other low back pain  Difficulty in walking, not elsewhere classified  Muscle spasm of back  Cervicalgia  ONSET DATE: September 2023  SUBJECTIVE:                                                                                                                                                                                            SUBJECTIVE STATEMENT: A little slow this early in the morning, but not much pain now  PERTINENT HISTORY:  See above  PAIN:  Are you having pain? Yes: NPRS scale: 2/10 Pain location: low back, right knee Pain description: ache, sore Aggravating factors: yardwork, hosuework pain up to 8/10 Relieving factors: rest, pain can be 0/10 at times   PRECAUTIONS: None  WEIGHT BEARING RESTRICTIONS: No  FALLS:  Has patient fallen in last 6 months? No  LIVING ENVIRONMENT: Lives with: lives with their family and lives alone Lives in: House/apartment Stairs: No Has following equipment at home: Single point cane  OCCUPATION: retired  PLOF: Independent able to do yardwork and housework  PATIENT GOALS: walk better, less pain   OBJECTIVE:   DIAGNOSTIC FINDINGS:  osteoporosis  PATIENT SURVEYS:  FOTO 31  COGNITION:  Overall cognitive status: Within functional limits for tasks assessed     SENSATION: WFL  MUSCLE LENGTH: Tight calves, HS and piriformis  POSTURE: rounded shoulders, forward head, and decreased lumbar lordosis  PALPATION: She is tight and tender in the lumbar area and into the thoracic and upper trap area, mild tenderness in the buttocks  LUMBAR ROM:   AROM eval  Flexion Decreased 50%  Extension Decreased 100% with pain  Right lateral flexion   Left lateral flexion   Right rotation   Left rotation    (Blank rows = not tested)  LOWER EXTREMITY ROM:     Active  Right eval Left eval  Hip flexion    Hip extension    Hip abduction    Hip adduction    Hip internal rotation    Hip external rotation  Knee flexion    Knee extension    Ankle dorsiflexion    Ankle plantarflexion    Ankle inversion    Ankle eversion     (Blank rows = not tested)  LOWER EXTREMITY MMT:    MMT Right eval  Left eval  Hip flexion 3+ 3+  Hip extension    Hip abduction 3+ 3+  Hip adduction    Hip internal rotation    Hip external rotation    Knee flexion 3+ with pain 3+  Knee extension 3+ with pain 3+  Ankle dorsiflexion 4- 4-  Ankle plantarflexion    Ankle inversion    Ankle eversion     (Blank rows = not tested)  FUNCTIONAL TESTS:  5 times sit to stand: 31 seconds Timed up and go (TUG): 26 seconds with SPC  GAIT: Distance walked: 60 feet Assistive device utilized: Single point cane Level of assistance: SBA Comments: slow, slightly stooped    TODAY'S TREATMENT:  [facility] Adult PT Treatment:                                                                                                                             07/04/22 Nustep level 5 x 6 minutes Walk outside around the parking [employer] 2# LAQ 2# Marches 2# hip abduction 2# hip extension Ankle motions on dynadisc STM to the back and the feet and calves   07/02/22 Nu step level 5 x 6 minutes LAQ 1.5# 2x10 1.5# marches in sitting 1.5# hip abduction and extension Green tband HS curls STM to the back and the calves and feet and into the back Sit fit ankle motions sitting   06/26/22 Nustep level 5 x 6 minutes Calf stretches on block and passive calf stretches while sitting 1.5# LAQ 2x10 Red tband ankle exercises Red tband HS curl Red tband clamshells Ball squeeze 1.5# marches sitting 1.5# hip abduction standing STM to the low back and gentle to the calves  DATE: 06/19/22 Nustep level 5 x 6 mintues 1.5# LAQ Red tband HS curls Red tband ankle exercises Red tband clamshells Ball b/n knees squeeze Yellow tband rows and extension STM to the low back in sitting     PATIENT EDUCATION:  Education details: POC Person educated: Patient Education method: Explanation Education comprehension: verbalized understanding   HOME EXERCISE PROGRAM: TBD  ASSESSMENT:  CLINICAL IMPRESSION: Patient reports that the  cramping is not as bad since the last visit.  We added some walking today and continued with the exercises, will slowly add OBJECTIVE IMPAIRMENTS: Abnormal gait, cardiopulmonary status limiting activity, decreased activity tolerance, decreased balance, decreased endurance, decreased mobility, difficulty walking, decreased ROM, decreased strength, increased muscle spasms, impaired flexibility, postural dysfunction, and pain.    REHAB POTENTIAL: Good  CLINICAL DECISION MAKING: Stable/uncomplicated  EVALUATION COMPLEXITY: Low   GOALS: Goals reviewed with patient? Yes  SHORT TERM GOALS: Target date: 06/24/22  Independent with initial HEP Goal status: met  LONG TERM GOALS: Target  date: 09/02/22  Decrease pain 50% Goal status: ongoing  2.  Independent with an advanced HEP  Goal status: INITIAL  3.  Decrease TUG time to 13 seconds Goal status: INITIAL  4.  Increase LE strength to 4/5 Goal status: INITIAL  5.  Walk 10 minutes with SPC and without rest Goal status:ongoing  PLAN: PT FREQUENCY: 1-2x/week  PT DURATION: 12 weeks  PLANNED INTERVENTIONS: Therapeutic exercises, Therapeutic activity, Neuromuscular re-education, Balance training, Gait training, Patient/Family education, Self Care, Joint mobilization, Stair training, Dry Needling, Electrical stimulation, Moist heat, and Manual therapy.  PLAN FOR NEXT SESSION: slowly add exercises   Jearld Lesch, PT 07/04/2022, 8:31 AM

## 2022-07-09 ENCOUNTER — Encounter: Payer: Self-pay | Admitting: Physical Therapy

## 2022-07-09 ENCOUNTER — Ambulatory Visit: Payer: Medicare Other | Admitting: Physical Therapy

## 2022-07-09 DIAGNOSIS — R262 Difficulty in walking, not elsewhere classified: Secondary | ICD-10-CM | POA: Diagnosis not present

## 2022-07-09 DIAGNOSIS — M542 Cervicalgia: Secondary | ICD-10-CM

## 2022-07-09 DIAGNOSIS — M62830 Muscle spasm of back: Secondary | ICD-10-CM

## 2022-07-09 DIAGNOSIS — M5459 Other low back pain: Secondary | ICD-10-CM

## 2022-07-09 NOTE — Therapy (Signed)
OUTPATIENT PHYSICAL THERAPY THORACOLUMBAR TREATMENT   Patient Name: Linda Mcgrath MRN: [id number] DOB:1933-02-28, 88 y.o., female Today's Date: 07/09/2022   PT End of Session - 07/09/22 1257     Visit Number 6    Date for PT Re-Evaluation 09/10/22    Authorization Type [employer]    PT Start Time 1255    PT Stop Time 1345    PT Time Calculation (min) 50 min    Activity Tolerance Patient tolerated treatment well    Behavior During Therapy [facility] for tasks assessed/performed             Past Medical History:  Diagnosis Date   Allergic rhinitis    Arthritis    Asthma    Cataract    Bil/lens implant   Colon polyp 1999   small polyp   Diverticulosis    Dysrhythmia    Fibromyalgia    Hyperlipidemia    Hypothyroidism    Internal hemorrhoids    Leg cramps    Lichen planus    Lung nodule    stable LUL 9 mm   Menopausal syndrome    Osteoarthritis    UTI (urinary tract infection)    Past Surgical History:  Procedure Laterality Date   Abd U/S  11/1998   negative   Abd U/S  01/2001   gallbladder polyps   ABDOMINAL HYSTERECTOMY  1975   total-fibroids   APPENDECTOMY  1975   BREAST BIOPSY     benign   BREAST EXCISIONAL BIOPSY Left 1957   CARDIOVERSION N/A 03/07/2016   Procedure: CARDIOVERSION;  Surgeon: Yates Decamp, MD;  Location: [facility] ENDOSCOPY;  Service: Cardiovascular;  Laterality: N/A;   CATARACT EXTRACTION     bilateral   CHOLECYSTECTOMY     COLONOSCOPY  1998   Diverticulosis; polyp\   COLONOSCOPY  09/2002   Diverticulosis, hem   COLONOSCOPY  12/2007   diverticulosis, polyp   DEXA  10/2001   osteopenia   ELECTROPHYSIOLOGIC STUDY N/A 04/03/2016   Procedure: A-Flutter Ablation;  Surgeon: Will [name], MD;  Location: [facility] INVASIVE CV LAB;  Service: Cardiovascular;  Laterality: N/A;   ESOPHAGOGASTRODUODENOSCOPY  2004   Hida scan  01/2001   Negative   KNEE SURGERY     right knee / 11/2004   LAMINECTOMY WITH POSTERIOR LATERAL ARTHRODESIS LEVEL 2  N/A 07/31/2017   Procedure: DECOMPRESSIVE LAMINECTOMY LUMABR FOUR-FIVE, LUMBAR FIVE-SACRAL ONE;  Surgeon: Tia Alert, MD;  Location: [facility] OR;  Service: Neurosurgery;  Laterality: N/A;   laser surgery for glaucoma Left 09/21/2014   MULTIPLE TOOTH EXTRACTIONS     rentinal tear     ROTATOR CUFF REPAIR     x 2 /right shoulder   SKIN CANCER EXCISION     pre-melanoma / on face   TEE WITHOUT CARDIOVERSION N/A 03/07/2016   Procedure: TRANSESOPHAGEAL ECHOCARDIOGRAM (TEE);  Surgeon: Yates Decamp, MD;  Location: [facility] ENDOSCOPY;  Service: Cardiovascular;  Laterality: N/A;   TOE SURGERY     rt foot    Patient Active Problem List   Diagnosis Date Noted   Acute cystitis without hematuria 03/26/2022   Vaginal itching 03/26/2022   Urinary frequency 03/26/2022   Vaginal candida 03/26/2022   Chronic upper back pain 08/30/2020   Aortic atherosclerosis (HCC) 05/31/2020   Abnormal CT scan, small bowel 05/18/2020   Insomnia 12/15/2019   Chronic low back pain 10/18/2019   Osteoarthritis of right knee 08/02/2019   S/P lumbar spinal fusion 07/31/2017   H/O atrial flutter 03/06/2016  Obesity 11/06/2015   Urge incontinence 01/24/2015   Estrogen deficiency 10/25/2014   Lichen planus 12/20/2013   Prediabetes 06/03/2011   HYPERTENSION, BENIGN ESSENTIAL 10/23/2010   OSTEOARTHRITIS, HANDS, BILATERAL 01/24/2010   Hypothyroidism 08/24/2008   Vitamin D deficiency 06/06/2008   Hyperlipidemia 06/06/2008   MENOPAUSAL SYNDROME 03/29/2008   Osteoporosis 03/29/2008   Allergic rhinitis 01/25/2008   IBS 01/25/2008   OSTEOARTHRITIS 01/25/2008   FIBROMYALGIA 01/25/2008    PCP: [employer]  REFERRING PROVIDER: [name]  REFERRING DIAG: Back pain, difficulty walking, osteoporosis  Rationale for Evaluation and Treatment Rehabilitation  THERAPY DIAG:  Other low back pain  Difficulty in walking, not elsewhere classified  Muscle spasm of back  Cervicalgia  ONSET DATE: September 2023  SUBJECTIVE:                                                                                                                                                                                            SUBJECTIVE STATEMENT: Doing okay, a little sore as the day goes on, knees feeling pretty good  PERTINENT HISTORY:  See above  PAIN:  Are you having pain? Yes: NPRS scale: 2/10 Pain location: low back, right knee Pain description: ache, sore Aggravating factors: yardwork, hosuework pain up to 8/10 Relieving factors: rest, pain can be 0/10 at times   PRECAUTIONS: None  WEIGHT BEARING RESTRICTIONS: No  FALLS:  Has patient fallen in last 6 months? No  LIVING ENVIRONMENT: Lives with: lives with their family and lives alone Lives in: House/apartment Stairs: No Has following equipment at home: Single point cane  OCCUPATION: retired  PLOF: Independent able to do yardwork and housework  PATIENT GOALS: walk better, less pain   OBJECTIVE:   DIAGNOSTIC FINDINGS:  osteoporosis  PATIENT SURVEYS:  FOTO 31  COGNITION:  Overall cognitive status: Within functional limits for tasks assessed     SENSATION: WFL  MUSCLE LENGTH: Tight calves, HS and piriformis  POSTURE: rounded shoulders, forward head, and decreased lumbar lordosis  PALPATION: She is tight and tender in the lumbar area and into the thoracic and upper trap area, mild tenderness in the buttocks  LUMBAR ROM:   AROM eval  Flexion Decreased 50%  Extension Decreased 100% with pain  Right lateral flexion   Left lateral flexion   Right rotation   Left rotation    (Blank rows = not tested)  LOWER EXTREMITY ROM:     Active  Right eval Left eval  Hip flexion    Hip extension    Hip abduction    Hip adduction    Hip internal rotation    Hip external rotation  Knee flexion    Knee extension    Ankle dorsiflexion    Ankle plantarflexion    Ankle inversion    Ankle eversion     (Blank rows = not tested)  LOWER EXTREMITY MMT:    MMT  Right eval Left eval  Hip flexion 3+ 3+  Hip extension    Hip abduction 3+ 3+  Hip adduction    Hip internal rotation    Hip external rotation    Knee flexion 3+ with pain 3+  Knee extension 3+ with pain 3+  Ankle dorsiflexion 4- 4-  Ankle plantarflexion    Ankle inversion    Ankle eversion     (Blank rows = not tested)  FUNCTIONAL TESTS:  5 times sit to stand: 31 seconds Timed up and go (TUG): 26 seconds with SPC  GAIT: Distance walked: 60 feet Assistive device utilized: Single point cane Level of assistance: SBA Comments: slow, slightly stooped    TODAY'S TREATMENT:  [facility] Adult PT Treatment:                                                                                                                             07/09/22 Nustep level 5 x 6 minutes Gait outside some grass, slope and uneven terrain On sit fit pelvic mobilitya nd some stability Red tband HS curls LAQ Ball b/n knees squeeze Red tband hip abduction in sitting Red tband ankle motions STM to the low back and calves   07/04/22 Nustep level 5 x 6 minutes Walk outside around the parking [place] 2# LAQ 2# Marches 2# hip abduction 2# hip extension Ankle motions on dynadisc STM to the back and the feet and calves   07/02/22 Nu step level 5 x 6 minutes LAQ 1.5# 2x10 1.5# marches in sitting 1.5# hip abduction and extension Green tband HS curls STM to the back and the calves and feet and into the back Sit fit ankle motions sitting   06/26/22 Nustep level 5 x 6 minutes Calf stretches on block and passive calf stretches while sitting 1.5# LAQ 2x10 Red tband ankle exercises Red tband HS curl Red tband clamshells Ball squeeze 1.5# marches sitting 1.5# hip abduction standing STM to the low back and gentle to the calves  DATE: 06/19/22 Nustep level 5 x 6 mintues 1.5# LAQ Red tband HS curls Red tband ankle exercises Red tband clamshells Ball b/n knees squeeze Yellow tband rows and  extension STM to the low back in sitting     PATIENT EDUCATION:  Education details: POC Person educated: Patient Education method: Explanation Education comprehension: verbalized understanding   HOME EXERCISE PROGRAM: TBD  ASSESSMENT:  CLINICAL IMPRESSION: Patient reports that the cramping was not as bad after the last treatment so we continued with some of the STM to the calves  OBJECTIVE IMPAIRMENTS: Abnormal gait, cardiopulmonary status limiting activity, decreased activity tolerance, decreased balance, decreased endurance, decreased mobility, difficulty walking, decreased ROM, decreased strength, increased muscle spasms,  impaired flexibility, postural dysfunction, and pain.    REHAB POTENTIAL: Good  CLINICAL DECISION MAKING: Stable/uncomplicated  EVALUATION COMPLEXITY: Low   GOALS: Goals reviewed with patient? Yes  SHORT TERM GOALS: Target date: 06/24/22  Independent with initial HEP Goal status: met  LONG TERM GOALS: Target date: 09/02/22  Decrease pain 50% Goal status: ongoing  2.  Independent with an advanced HEP  Goal status: INITIAL  3.  Decrease TUG time to 13 seconds Goal status: INITIAL  4.  Increase LE strength to 4/5 Goal status: INITIAL  5.  Walk 10 minutes with SPC and without rest Goal status:ongoing  PLAN: PT FREQUENCY: 1-2x/week  PT DURATION: 12 weeks  PLANNED INTERVENTIONS: Therapeutic exercises, Therapeutic activity, Neuromuscular re-education, Balance training, Gait training, Patient/Family education, Self Care, Joint mobilization, Stair training, Dry Needling, Electrical stimulation, Moist heat, and Manual therapy.  PLAN FOR NEXT SESSION: slowly add exercises   Jearld Lesch, PT 07/09/2022, 12:58 PM

## 2022-07-11 ENCOUNTER — Encounter: Payer: Self-pay | Admitting: Physical Therapy

## 2022-07-11 ENCOUNTER — Ambulatory Visit: Payer: Medicare Other | Admitting: Physical Therapy

## 2022-07-11 DIAGNOSIS — M542 Cervicalgia: Secondary | ICD-10-CM

## 2022-07-11 DIAGNOSIS — M5459 Other low back pain: Secondary | ICD-10-CM | POA: Diagnosis not present

## 2022-07-11 DIAGNOSIS — R262 Difficulty in walking, not elsewhere classified: Secondary | ICD-10-CM | POA: Diagnosis not present

## 2022-07-11 DIAGNOSIS — M62830 Muscle spasm of back: Secondary | ICD-10-CM | POA: Diagnosis not present

## 2022-07-11 NOTE — Therapy (Signed)
OUTPATIENT PHYSICAL THERAPY THORACOLUMBAR TREATMENT   Patient Name: Linda Mcgrath MRN: [id number] DOB:[birth year], 88 y.o., female Today's Date: 07/11/2022   PT End of Session - 07/11/22 1441     Visit Number 7    Date for PT Re-Evaluation 09/10/22    Authorization Type [employer]    PT Start Time 1440    PT Stop Time 1527    PT Time Calculation (min) 47 min    Activity Tolerance Patient tolerated treatment well    Behavior During Therapy [facility] for tasks assessed/performed             Past Medical History:  Diagnosis Date   Allergic rhinitis    Arthritis    Asthma    Cataract    Bil/lens implant   Colon polyp 1999   small polyp   Diverticulosis    Dysrhythmia    Fibromyalgia    Hyperlipidemia    Hypothyroidism    Internal hemorrhoids    Leg cramps    Lichen planus    Lung nodule    stable LUL 9 mm   Menopausal syndrome    Osteoarthritis    UTI (urinary tract infection)    Past Surgical History:  Procedure Laterality Date   Abd U/S  11/1998   negative   Abd U/S  01/2001   gallbladder polyps   ABDOMINAL HYSTERECTOMY  1975   total-fibroids   APPENDECTOMY  1975   BREAST BIOPSY     benign   BREAST EXCISIONAL BIOPSY Left 1957   CARDIOVERSION N/A 03/07/2016   Procedure: CARDIOVERSION;  Surgeon: Yates Decamp, MD;  Location: [facility] ENDOSCOPY;  Service: Cardiovascular;  Laterality: N/A;   CATARACT EXTRACTION     bilateral   CHOLECYSTECTOMY     COLONOSCOPY  1998   Diverticulosis; polyp\   COLONOSCOPY  09/2002   Diverticulosis, hem   COLONOSCOPY  12/2007   diverticulosis, polyp   DEXA  10/2001   osteopenia   ELECTROPHYSIOLOGIC STUDY N/A 04/03/2016   Procedure: A-Flutter Ablation;  Surgeon: Will [name], MD;  Location: [facility] INVASIVE CV LAB;  Service: Cardiovascular;  Laterality: N/A;   ESOPHAGOGASTRODUODENOSCOPY  2004   Hida scan  01/2001   Negative   KNEE SURGERY     right knee / 11/2004   LAMINECTOMY WITH POSTERIOR LATERAL ARTHRODESIS LEVEL 2  N/A 07/31/2017   Procedure: DECOMPRESSIVE LAMINECTOMY LUMABR FOUR-FIVE, LUMBAR FIVE-SACRAL ONE;  Surgeon: Tia Alert, MD;  Location: [facility] OR;  Service: Neurosurgery;  Laterality: N/A;   laser surgery for glaucoma Left 09/21/2014   MULTIPLE TOOTH EXTRACTIONS     rentinal tear     ROTATOR CUFF REPAIR     x 2 /right shoulder   SKIN CANCER EXCISION     pre-melanoma / on face   TEE WITHOUT CARDIOVERSION N/A 03/07/2016   Procedure: TRANSESOPHAGEAL ECHOCARDIOGRAM (TEE);  Surgeon: Yates Decamp, MD;  Location: [facility] ENDOSCOPY;  Service: Cardiovascular;  Laterality: N/A;   TOE SURGERY     rt foot    Patient Active Problem List   Diagnosis Date Noted   Acute cystitis without hematuria 03/26/2022   Vaginal itching 03/26/2022   Urinary frequency 03/26/2022   Vaginal candida 03/26/2022   Chronic upper back pain 08/30/2020   Aortic atherosclerosis (HCC) 05/31/2020   Abnormal CT scan, small bowel 05/18/2020   Insomnia 12/15/2019   Chronic low back pain 10/18/2019   Osteoarthritis of right knee 08/02/2019   S/P lumbar spinal fusion 07/31/2017   H/O atrial flutter 03/06/2016  Obesity 11/06/2015   Urge incontinence 01/24/2015   Estrogen deficiency 10/25/2014   Lichen planus 12/20/2013   Prediabetes 06/03/2011   HYPERTENSION, BENIGN ESSENTIAL 10/23/2010   OSTEOARTHRITIS, HANDS, BILATERAL 01/24/2010   Hypothyroidism 08/24/2008   Vitamin D deficiency 06/06/2008   Hyperlipidemia 06/06/2008   MENOPAUSAL SYNDROME 03/29/2008   Osteoporosis 03/29/2008   Allergic rhinitis 01/25/2008   IBS 01/25/2008   OSTEOARTHRITIS 01/25/2008   FIBROMYALGIA 01/25/2008    PCP: [employer]  REFERRING PROVIDER: [name]  REFERRING DIAG: Back pain, difficulty walking, osteoporosis  Rationale for Evaluation and Treatment Rehabilitation  THERAPY DIAG:  Other low back pain  Difficulty in walking, not elsewhere classified  Muscle spasm of back  Cervicalgia  ONSET DATE: September 2023  SUBJECTIVE:                                                                                                                                                                                            SUBJECTIVE STATEMENT: Not doing well today, woke up with a stopped up nose and did not sleep much after 2AM, tired and a little slow today  PERTINENT HISTORY:  See above  PAIN:  Are you having pain? Yes: NPRS scale: 5/10 Pain location: low back, right knee Pain description: ache, sore Aggravating factors: yardwork, hosuework pain up to 8/10 Relieving factors: rest, pain can be 0/10 at times   PRECAUTIONS: None  WEIGHT BEARING RESTRICTIONS: No  FALLS:  Has patient fallen in last 6 months? No  LIVING ENVIRONMENT: Lives with: lives with their family and lives alone Lives in: House/apartment Stairs: No Has following equipment at home: Single point cane  OCCUPATION: retired  PLOF: Independent able to do yardwork and housework  PATIENT GOALS: walk better, less pain   OBJECTIVE:   DIAGNOSTIC FINDINGS:  osteoporosis  PATIENT SURVEYS:  FOTO 31  COGNITION:  Overall cognitive status: Within functional limits for tasks assessed     SENSATION: WFL  MUSCLE LENGTH: Tight calves, HS and piriformis  POSTURE: rounded shoulders, forward head, and decreased lumbar lordosis  PALPATION: She is tight and tender in the lumbar area and into the thoracic and upper trap area, mild tenderness in the buttocks  LUMBAR ROM:   AROM eval  Flexion Decreased 50%  Extension Decreased 100% with pain  Right lateral flexion   Left lateral flexion   Right rotation   Left rotation    (Blank rows = not tested)  LOWER EXTREMITY ROM:     Active  Right eval Left eval  Hip flexion    Hip extension    Hip abduction    Hip adduction    Hip  internal rotation    Hip external rotation    Knee flexion    Knee extension    Ankle dorsiflexion    Ankle plantarflexion    Ankle inversion    Ankle eversion     (Blank rows =  not tested)  LOWER EXTREMITY MMT:    MMT Right eval Left eval  Hip flexion 3+ 3+  Hip extension    Hip abduction 3+ 3+  Hip adduction    Hip internal rotation    Hip external rotation    Knee flexion 3+ with pain 3+  Knee extension 3+ with pain 3+  Ankle dorsiflexion 4- 4-  Ankle plantarflexion    Ankle inversion    Ankle eversion     (Blank rows = not tested)  FUNCTIONAL TESTS:  5 times sit to stand: 31 seconds Timed up and go (TUG): 26 seconds with SPC  GAIT: Distance walked: 60 feet Assistive device utilized: Single point cane Level of assistance: SBA Comments: slow, slightly stooped    TODAY'S TREATMENT:  [facility] Adult PT Treatment:                                                                                                                             07/11/22 Nustep level 5 x 6 minutes Pelvic mobility and stability LAQ 2# Marches 2# Green tband HS curls Red tband row and extension Red tband around ankles sitting hip abduction Gait outside around the parking [employer] STM to the calves with passive calf stretches  07/09/22 Nustep level 5 x 6 minutes Gait outside some grass, slope and uneven terrain On sit fit pelvic mobilitya nd some stability Red tband HS curls LAQ Ball b/n knees squeeze Red tband hip abduction in sitting Red tband ankle motions STM to the low back and calves   07/04/22 Nustep level 5 x 6 minutes Walk outside around the parking island 2# LAQ 2# Marches 2# hip abduction 2# hip extension Ankle motions on dynadisc STM to the back and the feet and calves   07/02/22 Nu step level 5 x 6 minutes LAQ 1.5# 2x10 1.5# marches in sitting 1.5# hip abduction and extension Green tband HS curls STM to the back and the calves and feet and into the back Sit fit ankle motions sitting   06/26/22 Nustep level 5 x 6 minutes Calf stretches on block and passive calf stretches while sitting 1.5# LAQ 2x10 Red tband ankle exercises Red tband HS  curl Red tband clamshells Ball squeeze 1.5# marches sitting 1.5# hip abduction standing STM to the low back and gentle to the calves    PATIENT EDUCATION:  Education details: POC Person educated: Patient Education method: Explanation Education comprehension: verbalized understanding   HOME EXERCISE PROGRAM: TBD  ASSESSMENT:  CLINICAL IMPRESSION: Patient continues to report less cramping with [place] doing some STM and stretches.  I am trying to push the walking and some of the resistance exercises as she has been diagnosed with  osteoporosis  OBJECTIVE IMPAIRMENTS: Abnormal gait, cardiopulmonary status limiting activity, decreased activity tolerance, decreased balance, decreased endurance, decreased mobility, difficulty walking, decreased ROM, decreased strength, increased muscle spasms, impaired flexibility, postural dysfunction, and pain.    REHAB POTENTIAL: Good  CLINICAL DECISION MAKING: Stable/uncomplicated  EVALUATION COMPLEXITY: Low   GOALS: Goals reviewed with patient? Yes  SHORT TERM GOALS: Target date: 06/24/22  Independent with initial HEP Goal status: met  LONG TERM GOALS: Target date: 09/02/22  Decrease pain 50% Goal status: ongoing  2.  Independent with an advanced HEP  Goal status: INITIAL  3.  Decrease TUG time to 13 seconds Goal status: ongoing  4.  Increase LE strength to 4/5 Goal status: ongoing  5.  Walk 10 minutes with SPC and without rest Goal status:ongoing  PLAN: PT FREQUENCY: 1-2x/week  PT DURATION: 12 weeks  PLANNED INTERVENTIONS: Therapeutic exercises, Therapeutic activity, Neuromuscular re-education, Balance training, Gait training, Patient/Family education, Self Care, Joint mobilization, Stair training, Dry Needling, Electrical stimulation, Moist heat, and Manual therapy.  PLAN FOR NEXT SESSION: continue to add exercises and activity   Jearld Lesch, PT 07/11/2022, 2:42 PM

## 2022-07-23 ENCOUNTER — Encounter: Payer: Self-pay | Admitting: Physical Therapy

## 2022-07-23 ENCOUNTER — Ambulatory Visit: Payer: Medicare Other | Admitting: Physical Therapy

## 2022-07-23 DIAGNOSIS — R262 Difficulty in walking, not elsewhere classified: Secondary | ICD-10-CM

## 2022-07-23 DIAGNOSIS — M5459 Other low back pain: Secondary | ICD-10-CM

## 2022-07-23 DIAGNOSIS — M62830 Muscle spasm of back: Secondary | ICD-10-CM

## 2022-07-23 DIAGNOSIS — M542 Cervicalgia: Secondary | ICD-10-CM | POA: Diagnosis not present

## 2022-07-23 NOTE — Therapy (Signed)
OUTPATIENT PHYSICAL THERAPY THORACOLUMBAR TREATMENT   Patient Name: Linda Mcgrath MRN: [id number] DOB:02-23-1933, 88 y.o., female Today's Date: 07/23/2022   PT End of Session - 07/23/22 1256     Visit Number 8    Date for PT Re-Evaluation 09/10/22    Authorization Type [employer]    PT Start Time 1256    PT Stop Time 1345    PT Time Calculation (min) 49 min    Activity Tolerance Patient tolerated treatment well    Behavior During Therapy [facility] for tasks assessed/performed             Past Medical History:  Diagnosis Date   Allergic rhinitis    Arthritis    Asthma    Cataract    Bil/lens implant   Colon polyp 1999   small polyp   Diverticulosis    Dysrhythmia    Fibromyalgia    Hyperlipidemia    Hypothyroidism    Internal hemorrhoids    Leg cramps    Lichen planus    Lung nodule    stable LUL 9 mm   Menopausal syndrome    Osteoarthritis    UTI (urinary tract infection)    Past Surgical History:  Procedure Laterality Date   Abd U/S  11/1998   negative   Abd U/S  01/2001   gallbladder polyps   ABDOMINAL HYSTERECTOMY  1975   total-fibroids   APPENDECTOMY  1975   BREAST BIOPSY     benign   BREAST EXCISIONAL BIOPSY Left 1957   CARDIOVERSION N/A 03/07/2016   Procedure: CARDIOVERSION;  Surgeon: Yates Decamp, MD;  Location: [facility] ENDOSCOPY;  Service: Cardiovascular;  Laterality: N/A;   CATARACT EXTRACTION     bilateral   CHOLECYSTECTOMY     COLONOSCOPY  1998   Diverticulosis; polyp\   COLONOSCOPY  09/2002   Diverticulosis, hem   COLONOSCOPY  12/2007   diverticulosis, polyp   DEXA  10/2001   osteopenia   ELECTROPHYSIOLOGIC STUDY N/A 04/03/2016   Procedure: A-Flutter Ablation;  Surgeon: Will [name], MD;  Location: [facility] INVASIVE CV LAB;  Service: Cardiovascular;  Laterality: N/A;   ESOPHAGOGASTRODUODENOSCOPY  2004   Hida scan  01/2001   Negative   KNEE SURGERY     right knee / 11/2004   LAMINECTOMY WITH POSTERIOR LATERAL ARTHRODESIS LEVEL 2  N/A 07/31/2017   Procedure: DECOMPRESSIVE LAMINECTOMY LUMABR FOUR-FIVE, LUMBAR FIVE-SACRAL ONE;  Surgeon: Tia Alert, MD;  Location: [facility] OR;  Service: Neurosurgery;  Laterality: N/A;   laser surgery for glaucoma Left 09/21/2014   MULTIPLE TOOTH EXTRACTIONS     rentinal tear     ROTATOR CUFF REPAIR     x 2 /right shoulder   SKIN CANCER EXCISION     pre-melanoma / on face   TEE WITHOUT CARDIOVERSION N/A 03/07/2016   Procedure: TRANSESOPHAGEAL ECHOCARDIOGRAM (TEE);  Surgeon: Yates Decamp, MD;  Location: [facility] ENDOSCOPY;  Service: Cardiovascular;  Laterality: N/A;   TOE SURGERY     rt foot    Patient Active Problem List   Diagnosis Date Noted   Acute cystitis without hematuria 03/26/2022   Vaginal itching 03/26/2022   Urinary frequency 03/26/2022   Vaginal candida 03/26/2022   Chronic upper back pain 08/30/2020   Aortic atherosclerosis (HCC) 05/31/2020   Abnormal CT scan, small bowel 05/18/2020   Insomnia 12/15/2019   Chronic low back pain 10/18/2019   Osteoarthritis of right knee 08/02/2019   S/P lumbar spinal fusion 07/31/2017   H/O atrial flutter 03/06/2016  Obesity 11/06/2015   Urge incontinence 01/24/2015   Estrogen deficiency 10/25/2014   Lichen planus 12/20/2013   Prediabetes 06/03/2011   HYPERTENSION, BENIGN ESSENTIAL 10/23/2010   OSTEOARTHRITIS, HANDS, BILATERAL 01/24/2010   Hypothyroidism 08/24/2008   Vitamin D deficiency 06/06/2008   Hyperlipidemia 06/06/2008   MENOPAUSAL SYNDROME 03/29/2008   Osteoporosis 03/29/2008   Allergic rhinitis 01/25/2008   IBS 01/25/2008   OSTEOARTHRITIS 01/25/2008   FIBROMYALGIA 01/25/2008    PCP: [employer]  REFERRING PROVIDER: [name]  REFERRING DIAG: Back pain, difficulty walking, osteoporosis  Rationale for Evaluation and Treatment Rehabilitation  THERAPY DIAG:  Other low back pain  Difficulty in walking, not elsewhere classified  Muscle spasm of back  Cervicalgia  ONSET DATE: September 2023  SUBJECTIVE:                                                                                                                                                                                            SUBJECTIVE STATEMENT: Reports that she is still getting over her cold, just a little sluggish.  No falls.  Reports that the cramps int he legs have been better, but still some PERTINENT HISTORY:  See above  PAIN:  Are you having pain? Yes: NPRS scale: 4/10 Pain location: low back, right knee Pain description: ache, sore Aggravating factors: yardwork, hosuework pain up to 8/10 Relieving factors: rest, pain can be 0/10 at times   PRECAUTIONS: None  WEIGHT BEARING RESTRICTIONS: No  FALLS:  Has patient fallen in last 6 months? No  LIVING ENVIRONMENT: Lives with: lives with their family and lives alone Lives in: House/apartment Stairs: No Has following equipment at home: Single point cane  OCCUPATION: retired  PLOF: Independent able to do yardwork and housework  PATIENT GOALS: walk better, less pain   OBJECTIVE:   DIAGNOSTIC FINDINGS:  osteoporosis  PATIENT SURVEYS:  FOTO 31  COGNITION:  Overall cognitive status: Within functional limits for tasks assessed     SENSATION: WFL  MUSCLE LENGTH: Tight calves, HS and piriformis  POSTURE: rounded shoulders, forward head, and decreased lumbar lordosis  PALPATION: She is tight and tender in the lumbar area and into the thoracic and upper trap area, mild tenderness in the buttocks  LUMBAR ROM:   AROM eval  Flexion Decreased 50%  Extension Decreased 100% with pain  Right lateral flexion   Left lateral flexion   Right rotation   Left rotation    (Blank rows = not tested)  LOWER EXTREMITY ROM:     Active  Right eval Left eval  Hip flexion    Hip extension    Hip abduction    Hip  adduction    Hip internal rotation    Hip external rotation    Knee flexion    Knee extension    Ankle dorsiflexion    Ankle plantarflexion    Ankle inversion     Ankle eversion     (Blank rows = not tested)  LOWER EXTREMITY MMT:    MMT Right eval Left eval  Hip flexion 3+ 3+  Hip extension    Hip abduction 3+ 3+  Hip adduction    Hip internal rotation    Hip external rotation    Knee flexion 3+ with pain 3+  Knee extension 3+ with pain 3+  Ankle dorsiflexion 4- 4-  Ankle plantarflexion    Ankle inversion    Ankle eversion     (Blank rows = not tested)  FUNCTIONAL TESTS:  5 times sit to stand: 31 seconds Timed up and go (TUG): 26 seconds with SPC  GAIT: Distance walked: 60 feet Assistive device utilized: Single point cane Level of assistance: SBA Comments: slow, slightly stooped    TODAY'S TREATMENT:  [facility] Adult PT Treatment:                                                                                                                             07/23/22 Nustep level 5 x 7 minutes Leg curls 10# 3x10 LAQ 2# Marches 2# 2# hip abduction'Red tband ankle PF/DF Sit fit ankle exercises Pelvic mobility on the sit fit Passive stretch in sitting of the HS and the calves, STM to the calves  07/11/22 Nustep level 5 x 6 minutes Pelvic mobility and stability LAQ 2# Marches 2# Green tband HS curls Red tband row and extension Red tband around ankles sitting hip abduction Gait outside around the parking [employer] STM to the calves with passive calf stretches  07/09/22 Nustep level 5 x 6 minutes Gait outside some grass, slope and uneven terrain On sit fit pelvic mobilitya nd some stability Red tband HS curls LAQ Ball b/n knees squeeze Red tband hip abduction in sitting Red tband ankle motions STM to the low back and calves   07/04/22 Nustep level 5 x 6 minutes Walk outside around the parking island 2# LAQ 2# Marches 2# hip abduction 2# hip extension Ankle motions on dynadisc STM to the back and the feet and calves   07/02/22 Nu step level 5 x 6 minutes LAQ 1.5# 2x10 1.5# marches in sitting 1.5# hip abduction  and extension Green tband HS curls STM to the back and the calves and feet and into the back Sit fit ankle motions sitting   06/26/22 Nustep level 5 x 6 minutes Calf stretches on block and passive calf stretches while sitting 1.5# LAQ 2x10 Red tband ankle exercises Red tband HS curl Red tband clamshells Ball squeeze 1.5# marches sitting 1.5# hip abduction standing STM to the low back and gentle to the calves    PATIENT EDUCATION:  Education details: POC Person educated: Patient  Education method: Explanation Education comprehension: verbalized understanding   HOME EXERCISE PROGRAM: TBD  ASSESSMENT:  CLINICAL IMPRESSION: Patient reports that the leg cramping is getting better with what we have been doing, she reports still some in the feet area but less of the painful calve cramps.  OBJECTIVE IMPAIRMENTS: Abnormal gait, cardiopulmonary status limiting activity, decreased activity tolerance, decreased balance, decreased endurance, decreased mobility, difficulty walking, decreased ROM, decreased strength, increased muscle spasms, impaired flexibility, postural dysfunction, and pain.    REHAB POTENTIAL: Good  CLINICAL DECISION MAKING: Stable/uncomplicated  EVALUATION COMPLEXITY: Low   GOALS: Goals reviewed with patient? Yes  SHORT TERM GOALS: Target date: 06/24/22  Independent with initial HEP Goal status: met  LONG TERM GOALS: Target date: 09/02/22  Decrease pain 50% Goal status: ongoing  2.  Independent with an advanced HEP  Goal status:ongoing  3.  Decrease TUG time to 13 seconds Goal status: ongoing  4.  Increase LE strength to 4/5 Goal status: ongoing  5.  Walk 10 minutes with SPC and without rest Goal status:  progressing  PLAN: PT FREQUENCY: 1-2x/week  PT DURATION: 12 weeks  PLANNED INTERVENTIONS: Therapeutic exercises, Therapeutic activity, Neuromuscular re-education, Balance training, Gait training, Patient/Family education, Self Care, Joint  mobilization, Stair training, Dry Needling, Electrical stimulation, Moist heat, and Manual therapy.  PLAN FOR NEXT SESSION: continue to add exercises and activity   Jearld Lesch, PT 07/23/2022, 12:57 PM

## 2022-07-25 ENCOUNTER — Ambulatory Visit: Payer: Medicare Other | Admitting: Physical Therapy

## 2022-07-25 ENCOUNTER — Encounter: Payer: Self-pay | Admitting: Physical Therapy

## 2022-07-25 DIAGNOSIS — R262 Difficulty in walking, not elsewhere classified: Secondary | ICD-10-CM | POA: Diagnosis not present

## 2022-07-25 DIAGNOSIS — M5459 Other low back pain: Secondary | ICD-10-CM

## 2022-07-25 DIAGNOSIS — M542 Cervicalgia: Secondary | ICD-10-CM | POA: Diagnosis not present

## 2022-07-25 DIAGNOSIS — M62830 Muscle spasm of back: Secondary | ICD-10-CM | POA: Diagnosis not present

## 2022-07-25 NOTE — Therapy (Signed)
OUTPATIENT PHYSICAL THERAPY THORACOLUMBAR TREATMENT   Patient Name: Linda Mcgrath MRN: [id number] DOB:09/19/1932, 88 y.o., female Today's Date: 07/25/2022   PT End of Session - 07/25/22 1442     Visit Number 9    Date for PT Re-Evaluation 09/10/22    Authorization Type [employer]    PT Start Time 1440    PT Stop Time 1530    PT Time Calculation (min) 50 min    Activity Tolerance Patient tolerated treatment well    Behavior During Therapy [facility] for tasks assessed/performed             Past Medical History:  Diagnosis Date   Allergic rhinitis    Arthritis    Asthma    Cataract    Bil/lens implant   Colon polyp 1999   small polyp   Diverticulosis    Dysrhythmia    Fibromyalgia    Hyperlipidemia    Hypothyroidism    Internal hemorrhoids    Leg cramps    Lichen planus    Lung nodule    stable LUL 9 mm   Menopausal syndrome    Osteoarthritis    UTI (urinary tract infection)    Past Surgical History:  Procedure Laterality Date   Abd U/S  11/1998   negative   Abd U/S  01/2001   gallbladder polyps   ABDOMINAL HYSTERECTOMY  1975   total-fibroids   APPENDECTOMY  1975   BREAST BIOPSY     benign   BREAST EXCISIONAL BIOPSY Left 1957   CARDIOVERSION N/A 03/07/2016   Procedure: CARDIOVERSION;  Surgeon: Yates Decamp, MD;  Location: [facility] ENDOSCOPY;  Service: Cardiovascular;  Laterality: N/A;   CATARACT EXTRACTION     bilateral   CHOLECYSTECTOMY     COLONOSCOPY  1998   Diverticulosis; polyp\   COLONOSCOPY  09/2002   Diverticulosis, hem   COLONOSCOPY  12/2007   diverticulosis, polyp   DEXA  10/2001   osteopenia   ELECTROPHYSIOLOGIC STUDY N/A 04/03/2016   Procedure: A-Flutter Ablation;  Surgeon: Will [name], MD;  Location: [facility] INVASIVE CV LAB;  Service: Cardiovascular;  Laterality: N/A;   ESOPHAGOGASTRODUODENOSCOPY  2004   Hida scan  01/2001   Negative   KNEE SURGERY     right knee / 11/2004   LAMINECTOMY WITH POSTERIOR LATERAL ARTHRODESIS LEVEL 2  N/A 07/31/2017   Procedure: DECOMPRESSIVE LAMINECTOMY LUMABR FOUR-FIVE, LUMBAR FIVE-SACRAL ONE;  Surgeon: Tia Alert, MD;  Location: [facility] OR;  Service: Neurosurgery;  Laterality: N/A;   laser surgery for glaucoma Left 09/21/2014   MULTIPLE TOOTH EXTRACTIONS     rentinal tear     ROTATOR CUFF REPAIR     x 2 /right shoulder   SKIN CANCER EXCISION     pre-melanoma / on face   TEE WITHOUT CARDIOVERSION N/A 03/07/2016   Procedure: TRANSESOPHAGEAL ECHOCARDIOGRAM (TEE);  Surgeon: Yates Decamp, MD;  Location: [facility] ENDOSCOPY;  Service: Cardiovascular;  Laterality: N/A;   TOE SURGERY     rt foot    Patient Active Problem List   Diagnosis Date Noted   Acute cystitis without hematuria 03/26/2022   Vaginal itching 03/26/2022   Urinary frequency 03/26/2022   Vaginal candida 03/26/2022   Chronic upper back pain 08/30/2020   Aortic atherosclerosis (HCC) 05/31/2020   Abnormal CT scan, small bowel 05/18/2020   Insomnia 12/15/2019   Chronic low back pain 10/18/2019   Osteoarthritis of right knee 08/02/2019   S/P lumbar spinal fusion 07/31/2017   H/O atrial flutter 03/06/2016  Obesity 11/06/2015   Urge incontinence 01/24/2015   Estrogen deficiency 10/25/2014   Lichen planus 12/20/2013   Prediabetes 06/03/2011   HYPERTENSION, BENIGN ESSENTIAL 10/23/2010   OSTEOARTHRITIS, HANDS, BILATERAL 01/24/2010   Hypothyroidism 08/24/2008   Vitamin D deficiency 06/06/2008   Hyperlipidemia 06/06/2008   MENOPAUSAL SYNDROME 03/29/2008   Osteoporosis 03/29/2008   Allergic rhinitis 01/25/2008   IBS 01/25/2008   OSTEOARTHRITIS 01/25/2008   FIBROMYALGIA 01/25/2008    PCP: [employer]  REFERRING PROVIDER: [name]  REFERRING DIAG: Back pain, difficulty walking, osteoporosis  Rationale for Evaluation and Treatment Rehabilitation  THERAPY DIAG:  Other low back pain  Difficulty in walking, not elsewhere classified  Muscle spasm of back  Cervicalgia  ONSET DATE: September 2023  SUBJECTIVE:                                                                                                                                                                                            SUBJECTIVE STATEMENT: Reports that she is doing well, some increase of knee and back pain.   PERTINENT HISTORY:  See above  PAIN:  Are you having pain? Yes: NPRS scale: 6/10 Pain location: low back, right knee Pain description: ache, sore Aggravating factors: yardwork, hosuework pain up to 8/10 Relieving factors: rest, pain can be 0/10 at times   PRECAUTIONS: None  WEIGHT BEARING RESTRICTIONS: No  FALLS:  Has patient fallen in last 6 months? No  LIVING ENVIRONMENT: Lives with: lives with their family and lives alone Lives in: House/apartment Stairs: No Has following equipment at home: Single point cane  OCCUPATION: retired  PLOF: Independent able to do yardwork and housework  PATIENT GOALS: walk better, less pain   OBJECTIVE:   DIAGNOSTIC FINDINGS:  osteoporosis  PATIENT SURVEYS:  FOTO 31  COGNITION:  Overall cognitive status: Within functional limits for tasks assessed     SENSATION: WFL  MUSCLE LENGTH: Tight calves, HS and piriformis  POSTURE: rounded shoulders, forward head, and decreased lumbar lordosis  PALPATION: She is tight and tender in the lumbar area and into the thoracic and upper trap area, mild tenderness in the buttocks  LUMBAR ROM:   AROM eval  Flexion Decreased 50%  Extension Decreased 100% with pain  Right lateral flexion   Left lateral flexion   Right rotation   Left rotation    (Blank rows = not tested)  LOWER EXTREMITY ROM:     Active  Right eval Left eval  Hip flexion    Hip extension    Hip abduction    Hip adduction    Hip internal rotation    Hip external rotation  Knee flexion    Knee extension    Ankle dorsiflexion    Ankle plantarflexion    Ankle inversion    Ankle eversion     (Blank rows = not tested)  LOWER EXTREMITY MMT:    MMT  Right eval Left eval  Hip flexion 3+ 3+  Hip extension    Hip abduction 3+ 3+  Hip adduction    Hip internal rotation    Hip external rotation    Knee flexion 3+ with pain 3+  Knee extension 3+ with pain 3+  Ankle dorsiflexion 4- 4-  Ankle plantarflexion    Ankle inversion    Ankle eversion     (Blank rows = not tested)  FUNCTIONAL TESTS:  5 times sit to stand: 31 seconds Timed up and go (TUG): 26 seconds with [facility]   07/25/22 TUG 22 seconds with SPC  GAIT: Distance walked: 60 feet Assistive device utilized: Single point cane Level of assistance: SBA Comments: slow, slightly stooped    TODAY'S TREATMENT:  [facility] Adult PT Treatment:                                                                                                                             07/25/22 Nustep level 5 x 7 minutes 2.5# LAQ 2.5# marches 2.5# seated hip abduction 10# knee flexion Green tband clamshells Red tband ankle DF/PF Pelvic mobility on the sit fit Passive stretch of the calves and HS STM to the calves  07/23/22 Nustep level 5 x 7 minutes Leg curls 10# 3x10 LAQ 2# Marches 2# 2# hip abduction'Red tband ankle PF/DF Sit fit ankle exercises Pelvic mobility on the sit fit Passive stretch in sitting of the HS and the calves, STM to the calves  07/11/22 Nustep level 5 x 6 minutes Pelvic mobility and stability LAQ 2# Marches 2# Green tband HS curls Red tband row and extension Red tband around ankles sitting hip abduction Gait outside around the parking [employer] STM to the calves with passive calf stretches  07/09/22 Nustep level 5 x 6 minutes Gait outside some grass, slope and uneven terrain On sit fit pelvic mobilitya nd some stability Red tband HS curls LAQ Ball b/n knees squeeze Red tband hip abduction in sitting Red tband ankle motions STM to the low back and calves   07/04/22 Nustep level 5 x 6 minutes Walk outside around the parking island 2# LAQ 2# Marches 2# hip  abduction 2# hip extension Ankle motions on dynadisc STM to the back and the feet and calves   07/02/22 Nu step level 5 x 6 minutes LAQ 1.5# 2x10 1.5# marches in sitting 1.5# hip abduction and extension Green tband HS curls STM to the back and the calves and feet and into the back Sit fit ankle motions sitting   06/26/22 Nustep level 5 x 6 minutes Calf stretches on block and passive calf stretches while sitting 1.5# LAQ 2x10 Red tband ankle exercises Red tband HS  curl Red tband clamshells Ball squeeze 1.5# marches sitting 1.5# hip abduction standing STM to the low back and gentle to the calves    PATIENT EDUCATION:  Education details: POC Person educated: Patient Education method: Explanation Education comprehension: verbalized understanding   HOME EXERCISE PROGRAM: TBD  ASSESSMENT:  CLINICAL IMPRESSION: Patient continues to report less cramping and spasms at night.  She is having a little pain in the knees more.  She reports that she has not been as active as her normal is and would like to try to do some things around the house in the near future  OBJECTIVE IMPAIRMENTS: Abnormal gait, cardiopulmonary status limiting activity, decreased activity tolerance, decreased balance, decreased endurance, decreased mobility, difficulty walking, decreased ROM, decreased strength, increased muscle spasms, impaired flexibility, postural dysfunction, and pain.    REHAB POTENTIAL: Good  CLINICAL DECISION MAKING: Stable/uncomplicated  EVALUATION COMPLEXITY: Low   GOALS: Goals reviewed with patient? Yes  SHORT TERM GOALS: Target date: 06/24/22  Independent with initial HEP Goal status: met  LONG TERM GOALS: Target date: 09/02/22  Decrease pain 50% Goal status: ongoing  2.  Independent with an advanced HEP  Goal status:ongoing  3.  Decrease TUG time to 13 seconds Goal status: progressing  4.  Increase LE strength to 4/5 Goal status: ongoing  5.  Walk 10 minutes  with SPC and without rest Goal status:  progressing  PLAN: PT FREQUENCY: 1-2x/week  PT DURATION: 12 weeks  PLANNED INTERVENTIONS: Therapeutic exercises, Therapeutic activity, Neuromuscular re-education, Balance training, Gait training, Patient/Family education, Self Care, Joint mobilization, Stair training, Dry Needling, Electrical stimulation, Moist heat, and Manual therapy.  PLAN FOR NEXT SESSION: continue to add exercises and activity   Jearld Lesch, PT 07/25/2022, 2:46 PM

## 2022-07-30 ENCOUNTER — Encounter: Payer: Self-pay | Admitting: Physical Therapy

## 2022-07-30 ENCOUNTER — Ambulatory Visit: Payer: Medicare Other | Attending: Family Medicine | Admitting: Physical Therapy

## 2022-07-30 DIAGNOSIS — M62830 Muscle spasm of back: Secondary | ICD-10-CM | POA: Diagnosis not present

## 2022-07-30 DIAGNOSIS — M5459 Other low back pain: Secondary | ICD-10-CM | POA: Insufficient documentation

## 2022-07-30 DIAGNOSIS — R262 Difficulty in walking, not elsewhere classified: Secondary | ICD-10-CM | POA: Insufficient documentation

## 2022-07-30 DIAGNOSIS — M542 Cervicalgia: Secondary | ICD-10-CM | POA: Insufficient documentation

## 2022-07-30 NOTE — Therapy (Signed)
OUTPATIENT PHYSICAL THERAPY THORACOLUMBAR TREATMENT Progress Note Reporting Period 06/10/22 to 07/30/22  See note below for Objective Data and Assessment of Progress/Goals.      Patient Name: Linda Mcgrath MRN: [id number] DOB:[birth year], 88 y.o., female Today's Date: 07/30/2022   PT End of Session - 07/30/22 1309     Visit Number 10    Date for PT Re-Evaluation 09/10/22    Authorization Type [employer]    PT Start Time 1308    PT Stop Time 1357    PT Time Calculation (min) 49 min    Activity Tolerance Patient tolerated treatment well    Behavior During Therapy [facility] for tasks assessed/performed             Past Medical History:  Diagnosis Date   Allergic rhinitis    Arthritis    Asthma    Cataract    Bil/lens implant   Colon polyp 1999   small polyp   Diverticulosis    Dysrhythmia    Fibromyalgia    Hyperlipidemia    Hypothyroidism    Internal hemorrhoids    Leg cramps    Lichen planus    Lung nodule    stable LUL 9 mm   Menopausal syndrome    Osteoarthritis    UTI (urinary tract infection)    Past Surgical History:  Procedure Laterality Date   Abd U/S  11/1998   negative   Abd U/S  01/2001   gallbladder polyps   ABDOMINAL HYSTERECTOMY  1975   total-fibroids   APPENDECTOMY  1975   BREAST BIOPSY     benign   BREAST EXCISIONAL BIOPSY Left 1957   CARDIOVERSION N/A 03/07/2016   Procedure: CARDIOVERSION;  Surgeon: Yates Decamp, MD;  Location: [facility] ENDOSCOPY;  Service: Cardiovascular;  Laterality: N/A;   CATARACT EXTRACTION     bilateral   CHOLECYSTECTOMY     COLONOSCOPY  1998   Diverticulosis; polyp\   COLONOSCOPY  09/2002   Diverticulosis, hem   COLONOSCOPY  12/2007   diverticulosis, polyp   DEXA  10/2001   osteopenia   ELECTROPHYSIOLOGIC STUDY N/A 04/03/2016   Procedure: A-Flutter Ablation;  Surgeon: Will [name], MD;  Location: [facility] INVASIVE CV LAB;  Service: Cardiovascular;  Laterality: N/A;   ESOPHAGOGASTRODUODENOSCOPY  2004   Hida  scan  01/2001   Negative   KNEE SURGERY     right knee / 11/2004   LAMINECTOMY WITH POSTERIOR LATERAL ARTHRODESIS LEVEL 2 N/A 07/31/2017   Procedure: DECOMPRESSIVE LAMINECTOMY LUMABR FOUR-FIVE, LUMBAR FIVE-SACRAL ONE;  Surgeon: Tia Alert, MD;  Location: [facility] OR;  Service: Neurosurgery;  Laterality: N/A;   laser surgery for glaucoma Left 09/21/2014   MULTIPLE TOOTH EXTRACTIONS     rentinal tear     ROTATOR CUFF REPAIR     x 2 /right shoulder   SKIN CANCER EXCISION     pre-melanoma / on face   TEE WITHOUT CARDIOVERSION N/A 03/07/2016   Procedure: TRANSESOPHAGEAL ECHOCARDIOGRAM (TEE);  Surgeon: Yates Decamp, MD;  Location: [facility] ENDOSCOPY;  Service: Cardiovascular;  Laterality: N/A;   TOE SURGERY     rt foot    Patient Active Problem List   Diagnosis Date Noted   Acute cystitis without hematuria 03/26/2022   Vaginal itching 03/26/2022   Urinary frequency 03/26/2022   Vaginal candida 03/26/2022   Chronic upper back pain 08/30/2020   Aortic atherosclerosis (HCC) 05/31/2020   Abnormal CT scan, small bowel 05/18/2020   Insomnia 12/15/2019   Chronic low back pain 10/18/2019  Osteoarthritis of right knee 08/02/2019   S/P lumbar spinal fusion 07/31/2017   H/O atrial flutter 03/06/2016   Obesity 11/06/2015   Urge incontinence 01/24/2015   Estrogen deficiency 10/25/2014   Lichen planus 12/20/2013   Prediabetes 06/03/2011   HYPERTENSION, BENIGN ESSENTIAL 10/23/2010   OSTEOARTHRITIS, HANDS, BILATERAL 01/24/2010   Hypothyroidism 08/24/2008   Vitamin D deficiency 06/06/2008   Hyperlipidemia 06/06/2008   MENOPAUSAL SYNDROME 03/29/2008   Osteoporosis 03/29/2008   Allergic rhinitis 01/25/2008   IBS 01/25/2008   OSTEOARTHRITIS 01/25/2008   FIBROMYALGIA 01/25/2008    PCP: [employer]  REFERRING PROVIDER: [name]  REFERRING DIAG: Back pain, difficulty walking, osteoporosis  Rationale for Evaluation and Treatment Rehabilitation  THERAPY DIAG:  Other low back pain  Difficulty in walking,  not elsewhere classified  Muscle spasm of back  Cervicalgia  ONSET DATE: September 2023  SUBJECTIVE:                                                                                                                                                                                           SUBJECTIVE STATEMENT: Reports a little more pain this weekend with colder weather.   PERTINENT HISTORY:  See above  PAIN:  Are you having pain? Yes: NPRS scale: 7/10 Pain location: low back, right knee Pain description: ache, sore Aggravating factors: yardwork, hosuework pain up to 8/10 Relieving factors: rest, pain can be 0/10 at times   PRECAUTIONS: None  WEIGHT BEARING RESTRICTIONS: No  FALLS:  Has patient fallen in last 6 months? No  LIVING ENVIRONMENT: Lives with: lives with their family and lives alone Lives in: House/apartment Stairs: No Has following equipment at home: Single point cane  OCCUPATION: retired  PLOF: Independent able to do yardwork and housework  PATIENT GOALS: walk better, less pain   OBJECTIVE:   DIAGNOSTIC FINDINGS:  osteoporosis  PATIENT SURVEYS:  FOTO 31  COGNITION:  Overall cognitive status: Within functional limits for tasks assessed     SENSATION: WFL  MUSCLE LENGTH: Tight calves, HS and piriformis  POSTURE: rounded shoulders, forward head, and decreased lumbar lordosis  PALPATION: She is tight and tender in the lumbar area and into the thoracic and upper trap area, mild tenderness in the buttocks  LUMBAR ROM:   AROM eval  Flexion Decreased 50%  Extension Decreased 100% with pain  Right lateral flexion   Left lateral flexion   Right rotation   Left rotation    (Blank rows = not tested)  LOWER EXTREMITY ROM:     Active  Right eval Left eval  Hip flexion    Hip extension    Hip abduction  Hip adduction    Hip internal rotation    Hip external rotation    Knee flexion    Knee extension    Ankle dorsiflexion    Ankle  plantarflexion    Ankle inversion    Ankle eversion     (Blank rows = not tested)  LOWER EXTREMITY MMT:    MMT Right eval Left eval  Hip flexion 3+ 3+  Hip extension    Hip abduction 3+ 3+  Hip adduction    Hip internal rotation    Hip external rotation    Knee flexion 3+ with pain 3+  Knee extension 3+ with pain 3+  Ankle dorsiflexion 4- 4-  Ankle plantarflexion    Ankle inversion    Ankle eversion     (Blank rows = not tested)  FUNCTIONAL TESTS:  5 times sit to stand: 31 seconds Timed up and go (TUG): 26 seconds with [facility]   07/25/22 TUG 22 seconds with SPC  GAIT: Distance walked: 60 feet Assistive device utilized: Single point cane Level of assistance: SBA Comments: slow, slightly stooped    TODAY'S TREATMENT:  [facility] Adult PT Treatment:                                                                                                                             07/30/22 Nustep level 5 x 7 minutes Leg curls 10# 3x10 2.5# LAQ 2.5# seated hip abduction 2.5# seated marches Pelvic mobility with stability exercises on sit fit Green tband clamshells Green tband HS curls Walking 220 feet without device in the clinic STM to the calves, passive ROM of the toes and ankles stretch   07/25/22 Nustep level 5 x 7 minutes 2.5# LAQ 2.5# marches 2.5# seated hip abduction 10# knee flexion Green tband clamshells Red tband ankle DF/PF Pelvic mobility on the sit fit Passive stretch of the calves and HS STM to the calves  07/23/22 Nustep level 5 x 7 minutes Leg curls 10# 3x10 LAQ 2# Marches 2# 2# hip abduction'Red tband ankle PF/DF Sit fit ankle exercises Pelvic mobility on the sit fit Passive stretch in sitting of the HS and the calves, STM to the calves  07/11/22 Nustep level 5 x 6 minutes Pelvic mobility and stability LAQ 2# Marches 2# Green tband HS curls Red tband row and extension Red tband around ankles sitting hip abduction Gait outside around the  parking [employer] STM to the calves with passive calf stretches  07/09/22 Nustep level 5 x 6 minutes Gait outside some grass, slope and uneven terrain On sit fit pelvic mobilitya nd some stability Red tband HS curls LAQ Ball b/n knees squeeze Red tband hip abduction in sitting Red tband ankle motions STM to the low back and calves   07/04/22 Nustep level 5 x 6 minutes Walk outside around the parking [employer] 2# LAQ 2# Marches 2# hip abduction 2# hip extension Ankle motions on dynadisc STM to the back and the feet and  calves    PATIENT EDUCATION:  Education details: POC Person educated: Patient Education method: Explanation Education comprehension: verbalized understanding   HOME EXERCISE PROGRAM: TBD  ASSESSMENT:  CLINICAL IMPRESSION: Patient continues to report less cramping and spasms at night.  Reports a little more pain with the colder weather in the knees and the hands, she does have OA.  She has been able to walk a little further with less pain in the back and less difficulty overall  OBJECTIVE IMPAIRMENTS: Abnormal gait, cardiopulmonary status limiting activity, decreased activity tolerance, decreased balance, decreased endurance, decreased mobility, difficulty walking, decreased ROM, decreased strength, increased muscle spasms, impaired flexibility, postural dysfunction, and pain.    REHAB POTENTIAL: Good  CLINICAL DECISION MAKING: Stable/uncomplicated  EVALUATION COMPLEXITY: Low   GOALS: Goals reviewed with patient? Yes  SHORT TERM GOALS: Target date: 06/24/22  Independent with initial HEP Goal status: met  LONG TERM GOALS: Target date: 09/02/22  Decrease pain 50% Goal status: ongoing  2.  Independent with an advanced HEP  Goal status:ongoing  3.  Decrease TUG time to 13 seconds Goal status: progressing  4.  Increase LE strength to 4/5 Goal status: ongoing  5.  Walk 10 minutes with SPC and without rest Goal status:  progressing  PLAN: PT  FREQUENCY: 1-2x/week  PT DURATION: 12 weeks  PLANNED INTERVENTIONS: Therapeutic exercises, Therapeutic activity, Neuromuscular re-education, Balance training, Gait training, Patient/Family education, Self Care, Joint mobilization, Stair training, Dry Needling, Electrical stimulation, Moist heat, and Manual therapy.  PLAN FOR NEXT SESSION: will work to maximize her function and decrease her pain levels Charlissa Petros W, PT 07/30/2022, 1:12 PM

## 2022-08-01 ENCOUNTER — Ambulatory Visit: Payer: Medicare Other | Admitting: Physical Therapy

## 2022-08-01 ENCOUNTER — Encounter: Payer: Self-pay | Admitting: Physical Therapy

## 2022-08-01 DIAGNOSIS — R262 Difficulty in walking, not elsewhere classified: Secondary | ICD-10-CM | POA: Diagnosis not present

## 2022-08-01 DIAGNOSIS — M542 Cervicalgia: Secondary | ICD-10-CM | POA: Diagnosis not present

## 2022-08-01 DIAGNOSIS — M5459 Other low back pain: Secondary | ICD-10-CM | POA: Diagnosis not present

## 2022-08-01 DIAGNOSIS — M62830 Muscle spasm of back: Secondary | ICD-10-CM

## 2022-08-01 NOTE — Therapy (Signed)
OUTPATIENT PHYSICAL THERAPY THORACOLUMBAR TREATMENT    Patient Name: Linda Mcgrath MRN: [id number] DOB:[birth year], 88 y.o., female Today's Date: 08/01/2022   PT End of Session - 08/01/22 1253     Visit Number 11    Date for PT Re-Evaluation 09/10/22    Authorization Type [employer]    PT Start Time 1253    PT Stop Time 1341    PT Time Calculation (min) 48 min    Activity Tolerance Patient tolerated treatment well    Behavior During Therapy [facility] for tasks assessed/performed             Past Medical History:  Diagnosis Date   Allergic rhinitis    Arthritis    Asthma    Cataract    Bil/lens implant   Colon polyp 1999   small polyp   Diverticulosis    Dysrhythmia    Fibromyalgia    Hyperlipidemia    Hypothyroidism    Internal hemorrhoids    Leg cramps    Lichen planus    Lung nodule    stable LUL 9 mm   Menopausal syndrome    Osteoarthritis    UTI (urinary tract infection)    Past Surgical History:  Procedure Laterality Date   Abd U/S  11/1998   negative   Abd U/S  01/2001   gallbladder polyps   ABDOMINAL HYSTERECTOMY  1975   total-fibroids   APPENDECTOMY  1975   BREAST BIOPSY     benign   BREAST EXCISIONAL BIOPSY Left 1957   CARDIOVERSION N/A 03/07/2016   Procedure: CARDIOVERSION;  Surgeon: Yates Decamp, MD;  Location: [facility] ENDOSCOPY;  Service: Cardiovascular;  Laterality: N/A;   CATARACT EXTRACTION     bilateral   CHOLECYSTECTOMY     COLONOSCOPY  1998   Diverticulosis; polyp\   COLONOSCOPY  09/2002   Diverticulosis, hem   COLONOSCOPY  12/2007   diverticulosis, polyp   DEXA  10/2001   osteopenia   ELECTROPHYSIOLOGIC STUDY N/A 04/03/2016   Procedure: A-Flutter Ablation;  Surgeon: Will [name], MD;  Location: [facility] INVASIVE CV LAB;  Service: Cardiovascular;  Laterality: N/A;   ESOPHAGOGASTRODUODENOSCOPY  2004   Hida scan  01/2001   Negative   KNEE SURGERY     right knee / 11/2004   LAMINECTOMY WITH POSTERIOR LATERAL ARTHRODESIS LEVEL 2  N/A 07/31/2017   Procedure: DECOMPRESSIVE LAMINECTOMY LUMABR FOUR-FIVE, LUMBAR FIVE-SACRAL ONE;  Surgeon: Tia Alert, MD;  Location: [facility] OR;  Service: Neurosurgery;  Laterality: N/A;   laser surgery for glaucoma Left 09/21/2014   MULTIPLE TOOTH EXTRACTIONS     rentinal tear     ROTATOR CUFF REPAIR     x 2 /right shoulder   SKIN CANCER EXCISION     pre-melanoma / on face   TEE WITHOUT CARDIOVERSION N/A 03/07/2016   Procedure: TRANSESOPHAGEAL ECHOCARDIOGRAM (TEE);  Surgeon: Yates Decamp, MD;  Location: [facility] ENDOSCOPY;  Service: Cardiovascular;  Laterality: N/A;   TOE SURGERY     rt foot    Patient Active Problem List   Diagnosis Date Noted   Acute cystitis without hematuria 03/26/2022   Vaginal itching 03/26/2022   Urinary frequency 03/26/2022   Vaginal candida 03/26/2022   Chronic upper back pain 08/30/2020   Aortic atherosclerosis (HCC) 05/31/2020   Abnormal CT scan, small bowel 05/18/2020   Insomnia 12/15/2019   Chronic low back pain 10/18/2019   Osteoarthritis of right knee 08/02/2019   S/P lumbar spinal fusion 07/31/2017   H/O atrial flutter 03/06/2016  Obesity 11/06/2015   Urge incontinence 01/24/2015   Estrogen deficiency 10/25/2014   Lichen planus 12/20/2013   Prediabetes 06/03/2011   HYPERTENSION, BENIGN ESSENTIAL 10/23/2010   OSTEOARTHRITIS, HANDS, BILATERAL 01/24/2010   Hypothyroidism 08/24/2008   Vitamin D deficiency 06/06/2008   Hyperlipidemia 06/06/2008   MENOPAUSAL SYNDROME 03/29/2008   Osteoporosis 03/29/2008   Allergic rhinitis 01/25/2008   IBS 01/25/2008   OSTEOARTHRITIS 01/25/2008   FIBROMYALGIA 01/25/2008    PCP: [employer]  REFERRING PROVIDER: [name]  REFERRING DIAG: Back pain, difficulty walking, osteoporosis  Rationale for Evaluation and Treatment Rehabilitation  THERAPY DIAG:  Other low back pain  Difficulty in walking, not elsewhere classified  Muscle spasm of back  Cervicalgia  ONSET DATE: September 2023  SUBJECTIVE:                                                                                                                                                                                            SUBJECTIVE STATEMENT: Reports that she is doing well just some aches and pains with the cooler weather   PERTINENT HISTORY:  See above  PAIN:  Are you having pain? Yes: NPRS scale: 6/10 Pain location: low back, right knee Pain description: ache, sore Aggravating factors: yardwork, hosuework pain up to 8/10 Relieving factors: rest, pain can be 0/10 at times   PRECAUTIONS: None  WEIGHT BEARING RESTRICTIONS: No  FALLS:  Has patient fallen in last 6 months? No  LIVING ENVIRONMENT: Lives with: lives with their family and lives alone Lives in: House/apartment Stairs: No Has following equipment at home: Single point cane  OCCUPATION: retired  PLOF: Independent able to do yardwork and housework  PATIENT GOALS: walk better, less pain   OBJECTIVE:   DIAGNOSTIC FINDINGS:  osteoporosis  PATIENT SURVEYS:  FOTO 31  COGNITION:  Overall cognitive status: Within functional limits for tasks assessed     SENSATION: WFL  MUSCLE LENGTH: Tight calves, HS and piriformis  POSTURE: rounded shoulders, forward head, and decreased lumbar lordosis  PALPATION: She is tight and tender in the lumbar area and into the thoracic and upper trap area, mild tenderness in the buttocks  LUMBAR ROM:   AROM eval  Flexion Decreased 50%  Extension Decreased 100% with pain  Right lateral flexion   Left lateral flexion   Right rotation   Left rotation    (Blank rows = not tested)  LOWER EXTREMITY ROM:     Active  Right eval Left eval  Hip flexion    Hip extension    Hip abduction    Hip adduction    Hip internal rotation    Hip external rotation  Knee flexion    Knee extension    Ankle dorsiflexion    Ankle plantarflexion    Ankle inversion    Ankle eversion     (Blank rows = not tested)  LOWER EXTREMITY MMT:     MMT Right eval Left eval  Hip flexion 3+ 3+  Hip extension    Hip abduction 3+ 3+  Hip adduction    Hip internal rotation    Hip external rotation    Knee flexion 3+ with pain 3+  Knee extension 3+ with pain 3+  Ankle dorsiflexion 4- 4-  Ankle plantarflexion    Ankle inversion    Ankle eversion     (Blank rows = not tested)  FUNCTIONAL TESTS:  5 times sit to stand: 31 seconds Timed up and go (TUG): 26 seconds with [facility]   07/25/22 TUG 22 seconds with SPC  GAIT: Distance walked: 60 feet Assistive device utilized: Single point cane Level of assistance: SBA Comments: slow, slightly stooped    TODAY'S TREATMENT:  [facility] Adult PT Treatment:                                                                                                                             08/01/22 Nustep level5 x 7 minutes 15# leg curls 3x10 2.5# LAQ 3x10 2.5# seated marches and hip abduction Ball b/n knees squeeze Green tband clamshells Pelvic clocks On sit fit rows and extension with red tband STM to the low back in sitting  07/30/22 Nustep level 5 x 7 minutes Leg curls 10# 3x10 2.5# LAQ 2.5# seated hip abduction 2.5# seated marches Pelvic mobility with stability exercises on sit fit Green tband clamshells Green tband HS curls Walking 220 feet without device in the clinic STM to the calves, passive ROM of the toes and ankles stretch   07/25/22 Nustep level 5 x 7 minutes 2.5# LAQ 2.5# marches 2.5# seated hip abduction 10# knee flexion Green tband clamshells Red tband ankle DF/PF Pelvic mobility on the sit fit Passive stretch of the calves and HS STM to the calves  07/23/22 Nustep level 5 x 7 minutes Leg curls 10# 3x10 LAQ 2# Marches 2# 2# hip abduction'Red tband ankle PF/DF Sit fit ankle exercises Pelvic mobility on the sit fit Passive stretch in sitting of the HS and the calves, STM to the calves  07/11/22 Nustep level 5 x 6 minutes Pelvic mobility and stability LAQ  2# Marches 2# Green tband HS curls Red tband row and extension Red tband around ankles sitting hip abduction Gait outside around the parking [employer] STM to the calves with passive calf stretches  07/09/22 Nustep level 5 x 6 minutes Gait outside some grass, slope and uneven terrain On sit fit pelvic mobilitya nd some stability Red tband HS curls LAQ Ball b/n knees squeeze Red tband hip abduction in sitting Red tband ankle motions STM to the low back and calves   07/04/22 Nustep level 5 x 6 minutes  Walk outside around [place] 2# LAQ 2# Marches 2# hip abduction 2# hip extension Ankle motions on dynadisc STM to the back and the feet and calves    PATIENT EDUCATION:  Education details: POC Person educated: Patient Education method: Explanation Education comprehension: verbalized understanding   HOME EXERCISE PROGRAM: TBD  ASSESSMENT:  CLINICAL IMPRESSION: Patient continues to report less cramping and spasms at night.  Having some increase of pain in the knees and the low back with the cooler weather.  She does not c/o much pain with the exercises except the knees  OBJECTIVE IMPAIRMENTS: Abnormal gait, cardiopulmonary status limiting activity, decreased activity tolerance, decreased balance, decreased endurance, decreased mobility, difficulty walking, decreased ROM, decreased strength, increased muscle spasms, impaired flexibility, postural dysfunction, and pain.    REHAB POTENTIAL: Good  CLINICAL DECISION MAKING: Stable/uncomplicated  EVALUATION COMPLEXITY: Low   GOALS: Goals reviewed with patient? Yes  SHORT TERM GOALS: Target date: 06/24/22  Independent with initial HEP Goal status: met  LONG TERM GOALS: Target date: 09/02/22  Decrease pain 50% Goal status: ongoing  2.  Independent with an advanced HEP  Goal status:ongoing  3.  Decrease TUG time to 13 seconds Goal status: progressing  4.  Increase LE strength to 4/5 Goal status:  ongoing  5.  Walk 10 minutes with SPC and without rest Goal status:  progressing  PLAN: PT FREQUENCY: 1-2x/week  PT DURATION: 12 weeks  PLANNED INTERVENTIONS: Therapeutic exercises, Therapeutic activity, Neuromuscular re-education, Balance training, Gait training, Patient/Family education, Self Care, Joint mobilization, Stair training, Dry Needling, Electrical stimulation, Moist heat, and Manual therapy.  PLAN FOR NEXT SESSION: will work to maximize her function and decrease her pain levels Jearld Lesch, PT 08/01/2022, 12:54 PM

## 2022-08-06 ENCOUNTER — Ambulatory Visit: Payer: Medicare Other | Admitting: Physical Therapy

## 2022-08-06 ENCOUNTER — Encounter: Payer: Self-pay | Admitting: Physical Therapy

## 2022-08-06 DIAGNOSIS — M62830 Muscle spasm of back: Secondary | ICD-10-CM | POA: Diagnosis not present

## 2022-08-06 DIAGNOSIS — R262 Difficulty in walking, not elsewhere classified: Secondary | ICD-10-CM | POA: Diagnosis not present

## 2022-08-06 DIAGNOSIS — M5459 Other low back pain: Secondary | ICD-10-CM | POA: Diagnosis not present

## 2022-08-06 DIAGNOSIS — M542 Cervicalgia: Secondary | ICD-10-CM | POA: Diagnosis not present

## 2022-08-06 NOTE — Therapy (Signed)
OUTPATIENT PHYSICAL THERAPY THORACOLUMBAR TREATMENT    Patient Name: Linda Mcgrath MRN: [id number] DOB:09/28/1932, 88 y.o., female Today's Date: 08/06/2022   PT End of Session - 08/06/22 1249     Visit Number 12    Date for PT Re-Evaluation 09/10/22    Authorization Type [employer]    PT Start Time 1250    PT Stop Time 1340    PT Time Calculation (min) 50 min    Activity Tolerance Patient tolerated treatment well    Behavior During Therapy WFL for tasks assessed/performed             Past Medical History:  Diagnosis Date   Allergic rhinitis    Arthritis    Asthma    Cataract    Bil/lens implant   Colon polyp 1999   small polyp   Diverticulosis    Dysrhythmia    Fibromyalgia    Hyperlipidemia    Hypothyroidism    Internal hemorrhoids    Leg cramps    Lichen planus    Lung nodule    stable LUL 9 mm   Menopausal syndrome    Osteoarthritis    UTI (urinary tract infection)    Past Surgical History:  Procedure Laterality Date   Abd U/S  11/1998   negative   Abd U/S  01/2001   gallbladder polyps   ABDOMINAL HYSTERECTOMY  1975   total-fibroids   APPENDECTOMY  1975   BREAST BIOPSY     benign   BREAST EXCISIONAL BIOPSY Left 1957   CARDIOVERSION N/A 03/07/2016   Procedure: CARDIOVERSION;  Surgeon: Jay Ganji, MD;  [facility];  Service: Cardiovascular;  Laterality: N/A;   CATARACT EXTRACTION     bilateral   CHOLECYSTECTOMY     COLONOSCOPY  1998   Diverticulosis; polyp\   COLONOSCOPY  09/2002   Diverticulosis, hem   COLONOSCOPY  12/2007   diverticulosis, polyp   DEXA  10/2001   osteopenia   ELECTROPHYSIOLOGIC STUDY N/A 04/03/2016   Procedure: A-Flutter Ablation;  Surgeon: Will Martin Camnitz, MD;  [facility];  Service: Cardiovascular;  Laterality: N/A;   ESOPHAGOGASTRODUODENOSCOPY  2004   Hida scan  01/2001   Negative   KNEE SURGERY     right knee / 11/2004   LAMINECTOMY WITH POSTERIOR LATERAL ARTHRODESIS LEVEL 2  N/A 07/31/2017   Procedure: DECOMPRESSIVE LAMINECTOMY LUMABR FOUR-FIVE, LUMBAR FIVE-SACRAL ONE;  Surgeon: Jones, David S, MD;  [facility];  Service: Neurosurgery;  Laterality: N/A;   laser surgery for glaucoma Left 09/21/2014   MULTIPLE TOOTH EXTRACTIONS     rentinal tear     ROTATOR CUFF REPAIR     x 2 /right shoulder   SKIN CANCER EXCISION     pre-melanoma / on face   TEE WITHOUT CARDIOVERSION N/A 03/07/2016   Procedure: TRANSESOPHAGEAL ECHOCARDIOGRAM (TEE);  Surgeon: Jay Ganji, MD;  [facility];  Service: Cardiovascular;  Laterality: N/A;   TOE SURGERY     rt foot    Patient Active Problem List   Diagnosis Date Noted   Acute cystitis without hematuria 03/26/2022   Vaginal itching 03/26/2022   Urinary frequency 03/26/2022   Vaginal candida 03/26/2022   Chronic upper back pain 08/30/2020   Aortic atherosclerosis (HCC) 05/31/2020   Abnormal CT scan, small bowel 05/18/2020   Insomnia 12/15/2019   Chronic low back pain 10/18/2019   Osteoarthritis of right knee 08/02/2019   S/P lumbar spinal fusion 07/31/2017   H/O atrial flutter 03/06/2016     OUTPATIENT PHYSICAL THERAPY THORACOLUMBAR TREATMENT    Patient Name: Linda Mcgrath MRN: [id number] DOB:26-Jun-1933, 88 y.o., female Today's Date: 08/06/2022   PT End of Session - 08/06/22 1249     Visit Number 12    Date for PT Re-Evaluation 09/10/22    Authorization Type [employer]    PT Start Time 1250    PT Stop Time 1340    PT Time Calculation (min) 50 min    Activity Tolerance Patient tolerated treatment well    Behavior During Therapy [facility] for tasks assessed/performed             Past Medical History:  Diagnosis Date   Allergic rhinitis    Arthritis    Asthma    Cataract    Bil/lens implant   Colon polyp 1999   small polyp   Diverticulosis    Dysrhythmia    Fibromyalgia    Hyperlipidemia    Hypothyroidism    Internal hemorrhoids    Leg cramps    Lichen planus    Lung nodule    stable LUL 9 mm   Menopausal syndrome    Osteoarthritis    UTI (urinary tract infection)    Past Surgical History:  Procedure Laterality Date   Abd U/S  11/1998   negative   Abd U/S  01/2001   gallbladder polyps   ABDOMINAL HYSTERECTOMY  1975   total-fibroids   APPENDECTOMY  1975   BREAST BIOPSY     benign   BREAST EXCISIONAL BIOPSY Left 1957   CARDIOVERSION N/A 03/07/2016   Procedure: CARDIOVERSION;  Surgeon: Adrian Prows, MD;  Location: [facility];  Service: Cardiovascular;  Laterality: N/A;   CATARACT EXTRACTION     bilateral   CHOLECYSTECTOMY     COLONOSCOPY  1998   Diverticulosis; polyp_0   COLONOSCOPY  09/2002   Diverticulosis, hem   COLONOSCOPY  12/2007   diverticulosis, polyp   DEXA  10/2001   osteopenia   ELECTROPHYSIOLOGIC STUDY N/A 04/03/2016   Procedure: A-Flutter Ablation;  Surgeon: Will Meredith Leeds, MD;  Location: [place] [facility];  Service: Cardiovascular;  Laterality: N/A;   ESOPHAGOGASTRODUODENOSCOPY  2004   Hida scan  01/2001   Negative   KNEE SURGERY     right knee / 11/2004   LAMINECTOMY WITH POSTERIOR LATERAL ARTHRODESIS LEVEL 2  N/A 07/31/2017   Procedure: DECOMPRESSIVE LAMINECTOMY LUMABR FOUR-FIVE, LUMBAR FIVE-SACRAL ONE;  Surgeon: Eustace Moore, MD;  Location: [place];  Service: Neurosurgery;  Laterality: N/A;   laser surgery for glaucoma Left 09/21/2014   MULTIPLE TOOTH EXTRACTIONS     rentinal tear     ROTATOR CUFF REPAIR     x 2 /right shoulder   SKIN CANCER EXCISION     pre-melanoma / on face   TEE WITHOUT CARDIOVERSION N/A 03/07/2016   Procedure: TRANSESOPHAGEAL ECHOCARDIOGRAM (TEE);  Surgeon: Adrian Prows, MD;  Location: [place];  Service: Cardiovascular;  Laterality: N/A;   TOE SURGERY     rt foot    Patient Active Problem List   Diagnosis Date Noted   Acute cystitis without hematuria 03/26/2022   Vaginal itching 03/26/2022   Urinary frequency 03/26/2022   Vaginal candida 03/26/2022   Chronic upper back pain 08/30/2020   Aortic atherosclerosis ([place]) 05/31/2020   Abnormal CT scan, small bowel 05/18/2020   Insomnia 12/15/2019   Chronic low back pain 10/18/2019   Osteoarthritis of right knee 08/02/2019   S/P lumbar spinal fusion 07/31/2017   H/O atrial flutter 03/06/2016  OUTPATIENT PHYSICAL THERAPY THORACOLUMBAR TREATMENT    Patient Name: Linda Mcgrath MRN: [id number] DOB:26-Jun-1933, 88 y.o., female Today's Date: 08/06/2022   PT End of Session - 08/06/22 1249     Visit Number 12    Date for PT Re-Evaluation 09/10/22    Authorization Type [employer]    PT Start Time 1250    PT Stop Time 1340    PT Time Calculation (min) 50 min    Activity Tolerance Patient tolerated treatment well    Behavior During Therapy [facility] for tasks assessed/performed             Past Medical History:  Diagnosis Date   Allergic rhinitis    Arthritis    Asthma    Cataract    Bil/lens implant   Colon polyp 1999   small polyp   Diverticulosis    Dysrhythmia    Fibromyalgia    Hyperlipidemia    Hypothyroidism    Internal hemorrhoids    Leg cramps    Lichen planus    Lung nodule    stable LUL 9 mm   Menopausal syndrome    Osteoarthritis    UTI (urinary tract infection)    Past Surgical History:  Procedure Laterality Date   Abd U/S  11/1998   negative   Abd U/S  01/2001   gallbladder polyps   ABDOMINAL HYSTERECTOMY  1975   total-fibroids   APPENDECTOMY  1975   BREAST BIOPSY     benign   BREAST EXCISIONAL BIOPSY Left 1957   CARDIOVERSION N/A 03/07/2016   Procedure: CARDIOVERSION;  Surgeon: Adrian Prows, MD;  Location: [facility];  Service: Cardiovascular;  Laterality: N/A;   CATARACT EXTRACTION     bilateral   CHOLECYSTECTOMY     COLONOSCOPY  1998   Diverticulosis; polyp_0   COLONOSCOPY  09/2002   Diverticulosis, hem   COLONOSCOPY  12/2007   diverticulosis, polyp   DEXA  10/2001   osteopenia   ELECTROPHYSIOLOGIC STUDY N/A 04/03/2016   Procedure: A-Flutter Ablation;  Surgeon: Will Meredith Leeds, MD;  Location: [place] [facility];  Service: Cardiovascular;  Laterality: N/A;   ESOPHAGOGASTRODUODENOSCOPY  2004   Hida scan  01/2001   Negative   KNEE SURGERY     right knee / 11/2004   LAMINECTOMY WITH POSTERIOR LATERAL ARTHRODESIS LEVEL 2  N/A 07/31/2017   Procedure: DECOMPRESSIVE LAMINECTOMY LUMABR FOUR-FIVE, LUMBAR FIVE-SACRAL ONE;  Surgeon: Eustace Moore, MD;  Location: [place];  Service: Neurosurgery;  Laterality: N/A;   laser surgery for glaucoma Left 09/21/2014   MULTIPLE TOOTH EXTRACTIONS     rentinal tear     ROTATOR CUFF REPAIR     x 2 /right shoulder   SKIN CANCER EXCISION     pre-melanoma / on face   TEE WITHOUT CARDIOVERSION N/A 03/07/2016   Procedure: TRANSESOPHAGEAL ECHOCARDIOGRAM (TEE);  Surgeon: Adrian Prows, MD;  Location: [facility];  Service: Cardiovascular;  Laterality: N/A;   TOE SURGERY     rt foot    Patient Active Problem List   Diagnosis Date Noted   Acute cystitis without hematuria 03/26/2022   Vaginal itching 03/26/2022   Urinary frequency 03/26/2022   Vaginal candida 03/26/2022   Chronic upper back pain 08/30/2020   Aortic atherosclerosis ([place]) 05/31/2020   Abnormal CT scan, small bowel 05/18/2020   Insomnia 12/15/2019   Chronic low back pain 10/18/2019   Osteoarthritis of right knee 08/02/2019   S/P lumbar spinal fusion 07/31/2017   H/O atrial flutter 03/06/2016  OUTPATIENT PHYSICAL THERAPY THORACOLUMBAR TREATMENT    Patient Name: Linda Mcgrath MRN: [id number] DOB:09/28/1932, 88 y.o., female Today's Date: 08/06/2022   PT End of Session - 08/06/22 1249     Visit Number 12    Date for PT Re-Evaluation 09/10/22    Authorization Type [employer]    PT Start Time 1250    PT Stop Time 1340    PT Time Calculation (min) 50 min    Activity Tolerance Patient tolerated treatment well    Behavior During Therapy WFL for tasks assessed/performed             Past Medical History:  Diagnosis Date   Allergic rhinitis    Arthritis    Asthma    Cataract    Bil/lens implant   Colon polyp 1999   small polyp   Diverticulosis    Dysrhythmia    Fibromyalgia    Hyperlipidemia    Hypothyroidism    Internal hemorrhoids    Leg cramps    Lichen planus    Lung nodule    stable LUL 9 mm   Menopausal syndrome    Osteoarthritis    UTI (urinary tract infection)    Past Surgical History:  Procedure Laterality Date   Abd U/S  11/1998   negative   Abd U/S  01/2001   gallbladder polyps   ABDOMINAL HYSTERECTOMY  1975   total-fibroids   APPENDECTOMY  1975   BREAST BIOPSY     benign   BREAST EXCISIONAL BIOPSY Left 1957   CARDIOVERSION N/A 03/07/2016   Procedure: CARDIOVERSION;  Surgeon: Jay Ganji, MD;  [facility];  Service: Cardiovascular;  Laterality: N/A;   CATARACT EXTRACTION     bilateral   CHOLECYSTECTOMY     COLONOSCOPY  1998   Diverticulosis; polyp\   COLONOSCOPY  09/2002   Diverticulosis, hem   COLONOSCOPY  12/2007   diverticulosis, polyp   DEXA  10/2001   osteopenia   ELECTROPHYSIOLOGIC STUDY N/A 04/03/2016   Procedure: A-Flutter Ablation;  Surgeon: Will Martin Camnitz, MD;  [facility];  Service: Cardiovascular;  Laterality: N/A;   ESOPHAGOGASTRODUODENOSCOPY  2004   Hida scan  01/2001   Negative   KNEE SURGERY     right knee / 11/2004   LAMINECTOMY WITH POSTERIOR LATERAL ARTHRODESIS LEVEL 2  N/A 07/31/2017   Procedure: DECOMPRESSIVE LAMINECTOMY LUMABR FOUR-FIVE, LUMBAR FIVE-SACRAL ONE;  Surgeon: Jones, David S, MD;  [facility];  Service: Neurosurgery;  Laterality: N/A;   laser surgery for glaucoma Left 09/21/2014   MULTIPLE TOOTH EXTRACTIONS     rentinal tear     ROTATOR CUFF REPAIR     x 2 /right shoulder   SKIN CANCER EXCISION     pre-melanoma / on face   TEE WITHOUT CARDIOVERSION N/A 03/07/2016   Procedure: TRANSESOPHAGEAL ECHOCARDIOGRAM (TEE);  Surgeon: Jay Ganji, MD;  [facility];  Service: Cardiovascular;  Laterality: N/A;   TOE SURGERY     rt foot    Patient Active Problem List   Diagnosis Date Noted   Acute cystitis without hematuria 03/26/2022   Vaginal itching 03/26/2022   Urinary frequency 03/26/2022   Vaginal candida 03/26/2022   Chronic upper back pain 08/30/2020   Aortic atherosclerosis (HCC) 05/31/2020   Abnormal CT scan, small bowel 05/18/2020   Insomnia 12/15/2019   Chronic low back pain 10/18/2019   Osteoarthritis of right knee 08/02/2019   S/P lumbar spinal fusion 07/31/2017   H/O atrial flutter 03/06/2016  

## 2022-08-08 ENCOUNTER — Encounter: Payer: Self-pay | Admitting: Physical Therapy

## 2022-08-08 ENCOUNTER — Ambulatory Visit: Payer: Medicare Other | Admitting: Physical Therapy

## 2022-08-08 DIAGNOSIS — M5459 Other low back pain: Secondary | ICD-10-CM | POA: Diagnosis not present

## 2022-08-08 DIAGNOSIS — M542 Cervicalgia: Secondary | ICD-10-CM | POA: Diagnosis not present

## 2022-08-08 DIAGNOSIS — M62830 Muscle spasm of back: Secondary | ICD-10-CM

## 2022-08-08 DIAGNOSIS — R262 Difficulty in walking, not elsewhere classified: Secondary | ICD-10-CM | POA: Diagnosis not present

## 2022-08-08 NOTE — Therapy (Signed)
OUTPATIENT PHYSICAL THERAPY THORACOLUMBAR TREATMENT    Patient Name: Linda Mcgrath MRN: [id number] DOB:[birth year], 88 y.o., female Today's Date: 08/08/2022   PT End of Session - 08/08/22 1323     Visit Number 13    Date for PT Re-Evaluation 09/10/22    Authorization Type [employer]    PT Start Time 1250    PT Stop Time 1334    PT Time Calculation (min) 44 min    Activity Tolerance Patient tolerated treatment well    Behavior During Therapy [facility] for tasks assessed/performed             Past Medical History:  Diagnosis Date   Allergic rhinitis    Arthritis    Asthma    Cataract    Bil/lens implant   Colon polyp 1999   small polyp   Diverticulosis    Dysrhythmia    Fibromyalgia    Hyperlipidemia    Hypothyroidism    Internal hemorrhoids    Leg cramps    Lichen planus    Lung nodule    stable LUL 9 mm   Menopausal syndrome    Osteoarthritis    UTI (urinary tract infection)    Past Surgical History:  Procedure Laterality Date   Abd U/S  11/1998   negative   Abd U/S  01/2001   gallbladder polyps   ABDOMINAL HYSTERECTOMY  1975   total-fibroids   APPENDECTOMY  1975   BREAST BIOPSY     benign   BREAST EXCISIONAL BIOPSY Left 1957   CARDIOVERSION N/A 03/07/2016   Procedure: CARDIOVERSION;  Surgeon: Yates Decamp, MD;  Location: [facility] ENDOSCOPY;  Service: Cardiovascular;  Laterality: N/A;   CATARACT EXTRACTION     bilateral   CHOLECYSTECTOMY     COLONOSCOPY  1998   Diverticulosis; polyp\   COLONOSCOPY  09/2002   Diverticulosis, hem   COLONOSCOPY  12/2007   diverticulosis, polyp   DEXA  10/2001   osteopenia   ELECTROPHYSIOLOGIC STUDY N/A 04/03/2016   Procedure: A-Flutter Ablation;  Surgeon: Will [name], MD;  [facility];  Service: Cardiovascular;  Laterality: N/A;   ESOPHAGOGASTRODUODENOSCOPY  2004   Hida scan  01/2001   Negative   KNEE SURGERY     right knee / 11/2004   LAMINECTOMY WITH POSTERIOR LATERAL ARTHRODESIS LEVEL 2  N/A 07/31/2017   Procedure: DECOMPRESSIVE LAMINECTOMY LUMABR FOUR-FIVE, LUMBAR FIVE-SACRAL ONE;  Surgeon: Tia Alert, MD;  Location: [facility] OR;  Service: Neurosurgery;  Laterality: N/A;   laser surgery for glaucoma Left 09/21/2014   MULTIPLE TOOTH EXTRACTIONS     rentinal tear     ROTATOR CUFF REPAIR     x 2 /right shoulder   SKIN CANCER EXCISION     pre-melanoma / on face   TEE WITHOUT CARDIOVERSION N/A 03/07/2016   Procedure: TRANSESOPHAGEAL ECHOCARDIOGRAM (TEE);  Surgeon: Yates Decamp, MD;  Location: [facility];  Service: Cardiovascular;  Laterality: N/A;   TOE SURGERY     rt foot    Patient Active Problem List   Diagnosis Date Noted   Acute cystitis without hematuria 03/26/2022   Vaginal itching 03/26/2022   Urinary frequency 03/26/2022   Vaginal candida 03/26/2022   Chronic upper back pain 08/30/2020   Aortic atherosclerosis (HCC) 05/31/2020   Abnormal CT scan, small bowel 05/18/2020   Insomnia 12/15/2019   Chronic low back pain 10/18/2019   Osteoarthritis of right knee 08/02/2019   S/P lumbar spinal fusion 07/31/2017   H/O atrial flutter 03/06/2016  Obesity 11/06/2015   Urge incontinence 01/24/2015   Estrogen deficiency 10/25/2014   Lichen planus 12/20/2013   Prediabetes 06/03/2011   HYPERTENSION, BENIGN ESSENTIAL 10/23/2010   OSTEOARTHRITIS, HANDS, BILATERAL 01/24/2010   Hypothyroidism 08/24/2008   Vitamin D deficiency 06/06/2008   Hyperlipidemia 06/06/2008   MENOPAUSAL SYNDROME 03/29/2008   Osteoporosis 03/29/2008   Allergic rhinitis 01/25/2008   IBS 01/25/2008   OSTEOARTHRITIS 01/25/2008   FIBROMYALGIA 01/25/2008    PCP: [name]  REFERRING PROVIDER: [name]  REFERRING DIAG: Back pain, difficulty walking, osteoporosis  Rationale for Evaluation and Treatment Rehabilitation  THERAPY DIAG:  Other low back pain  Muscle spasm of back  Cervicalgia  ONSET DATE: September 2023  SUBJECTIVE:                                                                                                                                                                                            SUBJECTIVE STATEMENT: Reports that she continues to have sore and painful knees.  She is walking slower today  PERTINENT HISTORY:  See above  PAIN:  Are you having pain? Yes: NPRS scale: 6/10 Pain location: low back, right knee Pain description: ache, sore Aggravating factors: yardwork, hosuework pain up to 8/10 Relieving factors: rest, pain can be 0/10 at times   PRECAUTIONS: None  WEIGHT BEARING RESTRICTIONS: No  FALLS:  Has patient fallen in last 6 months? No  LIVING ENVIRONMENT: Lives with: lives with their family and lives alone Lives in: House/apartment Stairs: No Has following equipment at home: Single point cane  OCCUPATION: retired  PLOF: Independent able to do yardwork and housework  PATIENT GOALS: walk better, less pain   OBJECTIVE:   DIAGNOSTIC FINDINGS:  osteoporosis  PATIENT SURVEYS:  FOTO 31  COGNITION:  Overall cognitive status: Within functional limits for tasks assessed     SENSATION: WFL  MUSCLE LENGTH: Tight calves, HS and piriformis  POSTURE: rounded shoulders, forward head, and decreased lumbar lordosis  PALPATION: She is tight and tender in the lumbar area and into the thoracic and upper trap area, mild tenderness in the buttocks  LUMBAR ROM:   AROM eval  Flexion Decreased 50%  Extension Decreased 100% with pain  Right lateral flexion   Left lateral flexion   Right rotation   Left rotation    (Blank rows = not tested)  LOWER EXTREMITY ROM:     Active  Right eval Left eval  Hip flexion    Hip extension    Hip abduction    Hip adduction    Hip internal rotation    Hip external rotation    Knee flexion  Knee extension    Ankle dorsiflexion    Ankle plantarflexion    Ankle inversion    Ankle eversion     (Blank rows = not tested)  LOWER EXTREMITY MMT:    MMT Right eval Left eval  Hip  flexion 3+ 3+  Hip extension    Hip abduction 3+ 3+  Hip adduction    Hip internal rotation    Hip external rotation    Knee flexion 3+ with pain 3+  Knee extension 3+ with pain 3+  Ankle dorsiflexion 4- 4-  Ankle plantarflexion    Ankle inversion    Ankle eversion     (Blank rows = not tested)  FUNCTIONAL TESTS:  5 times sit to stand: 31 seconds Timed up and go (TUG): 26 seconds with [facility]   07/25/22 TUG 22 seconds with SPC  GAIT: Distance walked: 60 feet Assistive device utilized: Single point cane Level of assistance: SBA Comments: slow, slightly stooped    TODAY'S TREATMENT:  [facility] Adult PT Treatment:                                                                                                                             08/08/22 Nustep level 5 x 7 minutes Shrugs Shoulder rolls Pball roll outs Red tband HS curls Seated marches and hip abduction Toe raise and heel raise in sitting STM to the calves the right low back  08/06/22 Nustep level 4 x 7 minutes Leg curls 20# 2x10 LAQ 2# Marches 2# Seated hip abduction 2# Seated red tband rows and extensions STM to the calves and feet, PROM of the toes and ankles STM to the low back  08/01/22 Nustep level5 x 7 minutes 15# leg curls 3x10 2.5# LAQ 3x10 2.5# seated marches and hip abduction Ball b/n knees squeeze Green tband clamshells Pelvic clocks On sit fit rows and extension with red tband STM to the low back in sitting  07/30/22 Nustep level 5 x 7 minutes Leg curls 10# 3x10 2.5# LAQ 2.5# seated hip abduction 2.5# seated marches Pelvic mobility with stability exercises on sit fit Green tband clamshells Green tband HS curls Walking 220 feet without device in the clinic STM to the calves, passive ROM of the toes and ankles stretch   07/25/22 Nustep level 5 x 7 minutes 2.5# LAQ 2.5# marches 2.5# seated hip abduction 10# knee flexion Green tband clamshells Red tband ankle DF/PF Pelvic mobility on  the sit fit Passive stretch of the calves and HS STM to the calves  07/23/22 Nustep level 5 x 7 minutes Leg curls 10# 3x10 LAQ 2# Marches 2# 2# hip abduction'Red tband ankle PF/DF Sit fit ankle exercises Pelvic mobility on the sit fit Passive stretch in sitting of the HS and the calves, STM to the calves  07/11/22 Nustep level 5 x 6 minutes Pelvic mobility and stability LAQ 2# Marches 2# Green tband HS curls Red tband row and extension Red tband around ankles  sitting hip abduction Gait outside around the parking island STM to the calves with passive calf stretches  PATIENT EDUCATION:  Education details: POC Person educated: Patient Education method: Explanation Education comprehension: verbalized understanding   HOME EXERCISE PROGRAM: TBD  ASSESSMENT:  CLINICAL IMPRESSION: Patient having some increased pain with the cold damp weather pain in the knees and the back.  She is not moving as well today and looks a little stiffer, she reports feeling better after the treatment and she does walk and move better  OBJECTIVE IMPAIRMENTS: Abnormal gait, cardiopulmonary status limiting activity, decreased activity tolerance, decreased balance, decreased endurance, decreased mobility, difficulty walking, decreased ROM, decreased strength, increased muscle spasms, impaired flexibility, postural dysfunction, and pain.    REHAB POTENTIAL: Good  CLINICAL DECISION MAKING: Stable/uncomplicated  EVALUATION COMPLEXITY: Low   GOALS: Goals reviewed with patient? Yes  SHORT TERM GOALS: Target date: 06/24/22  Independent with initial HEP Goal status: met  LONG TERM GOALS: Target date: 09/02/22  Decrease pain 50% Goal status: ongoing  2.  Independent with an advanced HEP  Goal status:ongoing  3.  Decrease TUG time to 13 seconds Goal status: progressing  4.  Increase LE strength to 4/5 Goal status: ongoing  5.  Walk 10 minutes with SPC and without rest Goal status:   progressing  PLAN: PT FREQUENCY: 1-2x/week  PT DURATION: 12 weeks  PLANNED INTERVENTIONS: Therapeutic exercises, Therapeutic activity, Neuromuscular re-education, Balance training, Gait training, Patient/Family education, Self Care, Joint mobilization, Stair training, Dry Needling, Electrical stimulation, Moist heat, and Manual therapy.  PLAN FOR NEXT SESSION: continue to push her function Jearld Lesch, PT 08/08/2022, 1:23 PM

## 2022-08-13 ENCOUNTER — Encounter: Payer: Self-pay | Admitting: Physical Therapy

## 2022-08-13 ENCOUNTER — Ambulatory Visit: Payer: Medicare Other | Admitting: Physical Therapy

## 2022-08-13 DIAGNOSIS — M5459 Other low back pain: Secondary | ICD-10-CM

## 2022-08-13 DIAGNOSIS — M62830 Muscle spasm of back: Secondary | ICD-10-CM

## 2022-08-13 DIAGNOSIS — R262 Difficulty in walking, not elsewhere classified: Secondary | ICD-10-CM

## 2022-08-13 DIAGNOSIS — M542 Cervicalgia: Secondary | ICD-10-CM

## 2022-08-13 NOTE — Therapy (Signed)
OUTPATIENT PHYSICAL THERAPY THORACOLUMBAR TREATMENT    Patient Name: Linda Mcgrath MRN: [id number] DOB:[birth year], 88 y.o., female Today's Date: 08/13/2022   PT End of Session - 08/13/22 1250     Visit Number 14    Date for PT Re-Evaluation 09/10/22    Authorization Type [employer]    PT Start Time 1244    PT Stop Time 1334    PT Time Calculation (min) 50 min    Activity Tolerance Patient tolerated treatment well    Behavior During Therapy [facility] for tasks assessed/performed             Past Medical History:  Diagnosis Date   Allergic rhinitis    Arthritis    Asthma    Cataract    Bil/lens implant   Colon polyp 1999   small polyp   Diverticulosis    Dysrhythmia    Fibromyalgia    Hyperlipidemia    Hypothyroidism    Internal hemorrhoids    Leg cramps    Lichen planus    Lung nodule    stable LUL 9 mm   Menopausal syndrome    Osteoarthritis    UTI (urinary tract infection)    Past Surgical History:  Procedure Laterality Date   Abd U/S  11/1998   negative   Abd U/S  01/2001   gallbladder polyps   ABDOMINAL HYSTERECTOMY  1975   total-fibroids   APPENDECTOMY  1975   BREAST BIOPSY     benign   BREAST EXCISIONAL BIOPSY Left 1957   CARDIOVERSION N/A 03/07/2016   Procedure: CARDIOVERSION;  Surgeon: Yates Decamp, MD;  Location: [facility] ENDOSCOPY;  Service: Cardiovascular;  Laterality: N/A;   CATARACT EXTRACTION     bilateral   CHOLECYSTECTOMY     COLONOSCOPY  1998   Diverticulosis; polyp\   COLONOSCOPY  09/2002   Diverticulosis, hem   COLONOSCOPY  12/2007   diverticulosis, polyp   DEXA  10/2001   osteopenia   ELECTROPHYSIOLOGIC STUDY N/A 04/03/2016   Procedure: A-Flutter Ablation;  Surgeon: Will [name], MD;  Location: [facility] INVASIVE CV LAB;  Service: Cardiovascular;  Laterality: N/A;   ESOPHAGOGASTRODUODENOSCOPY  2004   Hida scan  01/2001   Negative   KNEE SURGERY     right knee / 11/2004   LAMINECTOMY WITH POSTERIOR LATERAL ARTHRODESIS LEVEL 2  N/A 07/31/2017   Procedure: DECOMPRESSIVE LAMINECTOMY LUMABR FOUR-FIVE, LUMBAR FIVE-SACRAL ONE;  Surgeon: Tia Alert, MD;  Location: [facility] OR;  Service: Neurosurgery;  Laterality: N/A;   laser surgery for glaucoma Left 09/21/2014   MULTIPLE TOOTH EXTRACTIONS     rentinal tear     ROTATOR CUFF REPAIR     x 2 /right shoulder   SKIN CANCER EXCISION     pre-melanoma / on face   TEE WITHOUT CARDIOVERSION N/A 03/07/2016   Procedure: TRANSESOPHAGEAL ECHOCARDIOGRAM (TEE);  Surgeon: Yates Decamp, MD;  Location: [facility] ENDOSCOPY;  Service: Cardiovascular;  Laterality: N/A;   TOE SURGERY     rt foot    Patient Active Problem List   Diagnosis Date Noted   Acute cystitis without hematuria 03/26/2022   Vaginal itching 03/26/2022   Urinary frequency 03/26/2022   Vaginal candida 03/26/2022   Chronic upper back pain 08/30/2020   Aortic atherosclerosis (HCC) 05/31/2020   Abnormal CT scan, small bowel 05/18/2020   Insomnia 12/15/2019   Chronic low back pain 10/18/2019   Osteoarthritis of right knee 08/02/2019   S/P lumbar spinal fusion 07/31/2017   H/O atrial flutter 03/06/2016  Obesity 11/06/2015   Urge incontinence 01/24/2015   Estrogen deficiency 10/25/2014   Lichen planus 12/20/2013   Prediabetes 06/03/2011   HYPERTENSION, BENIGN ESSENTIAL 10/23/2010   OSTEOARTHRITIS, HANDS, BILATERAL 01/24/2010   Hypothyroidism 08/24/2008   Vitamin D deficiency 06/06/2008   Hyperlipidemia 06/06/2008   MENOPAUSAL SYNDROME 03/29/2008   Osteoporosis 03/29/2008   Allergic rhinitis 01/25/2008   IBS 01/25/2008   OSTEOARTHRITIS 01/25/2008   FIBROMYALGIA 01/25/2008    PCP: [employer]  REFERRING PROVIDER: [name]  REFERRING DIAG: Back pain, difficulty walking, osteoporosis  Rationale for Evaluation and Treatment Rehabilitation  THERAPY DIAG:  Other low back pain  Muscle spasm of back  Cervicalgia  Difficulty in walking, not elsewhere classified  ONSET DATE: September 2023  SUBJECTIVE:                                                                                                                                                                                            SUBJECTIVE STATEMENT: Patient reports that all the rain Sunday really made her ache all over.  PERTINENT HISTORY:  See above  PAIN:  Are you having pain? Yes: NPRS scale: 6/10 Pain location: low back, right knee Pain description: ache, sore Aggravating factors: yardwork, hosuework pain up to 8/10 Relieving factors: rest, pain can be 0/10 at times   PRECAUTIONS: None  WEIGHT BEARING RESTRICTIONS: No  FALLS:  Has patient fallen in last 6 months? No  LIVING ENVIRONMENT: Lives with: lives with their family and lives alone Lives in: House/apartment Stairs: No Has following equipment at home: Single point cane  OCCUPATION: retired  PLOF: Independent able to do yardwork and housework  PATIENT GOALS: walk better, less pain   OBJECTIVE:   DIAGNOSTIC FINDINGS:  osteoporosis  PATIENT SURVEYS:  FOTO 31  COGNITION:  Overall cognitive status: Within functional limits for tasks assessed     SENSATION: WFL  MUSCLE LENGTH: Tight calves, HS and piriformis  POSTURE: rounded shoulders, forward head, and decreased lumbar lordosis  PALPATION: She is tight and tender in the lumbar area and into the thoracic and upper trap area, mild tenderness in the buttocks  LUMBAR ROM:   AROM eval  Flexion Decreased 50%  Extension Decreased 100% with pain  Right lateral flexion   Left lateral flexion   Right rotation   Left rotation    (Blank rows = not tested)  LOWER EXTREMITY ROM:     Active  Right eval Left eval  Hip flexion    Hip extension    Hip abduction    Hip adduction    Hip internal rotation    Hip external rotation  Knee flexion    Knee extension    Ankle dorsiflexion    Ankle plantarflexion    Ankle inversion    Ankle eversion     (Blank rows = not tested)  LOWER EXTREMITY MMT:    MMT  Right eval Left eval  Hip flexion 3+ 3+  Hip extension    Hip abduction 3+ 3+  Hip adduction    Hip internal rotation    Hip external rotation    Knee flexion 3+ with pain 3+  Knee extension 3+ with pain 3+  Ankle dorsiflexion 4- 4-  Ankle plantarflexion    Ankle inversion    Ankle eversion     (Blank rows = not tested)  FUNCTIONAL TESTS:  5 times sit to stand: 31 seconds Timed up and go (TUG): 26 seconds with [facility]   07/25/22 TUG 22 seconds with SPC  GAIT: Distance walked: 60 feet Assistive device utilized: Single point cane Level of assistance: SBA Comments: slow, slightly stooped    TODAY'S TREATMENT:  [facility] Adult PT Treatment:                                                                                                                             08/12/22 Nustep level 5 x 7 minutes 20# HS curls 2.5# LAQ Red tband hip abduction Red tband rows 2.5# marches Pball roll outs Shrugs Seated toe raises and heel raises STM tot he low back and into the rhomboids   08/08/22 Nustep level 5 x 7 minutes Shrugs Shoulder rolls Pball roll outs Red tband HS curls Seated marches and hip abduction Toe raise and heel raise in sitting STM to the calves the right low back  08/06/22 Nustep level 4 x 7 minutes Leg curls 20# 2x10 LAQ 2# Marches 2# Seated hip abduction 2# Seated red tband rows and extensions STM to the calves and feet, PROM of the toes and ankles STM to the low back  08/01/22 Nustep level5 x 7 minutes 15# leg curls 3x10 2.5# LAQ 3x10 2.5# seated marches and hip abduction Ball b/n knees squeeze Green tband clamshells Pelvic clocks On sit fit rows and extension with red tband STM to the low back in sitting  07/30/22 Nustep level 5 x 7 minutes Leg curls 10# 3x10 2.5# LAQ 2.5# seated hip abduction 2.5# seated marches Pelvic mobility with stability exercises on sit fit Green tband clamshells Green tband HS curls Walking 220 feet without device in  the clinic STM to the calves, passive ROM of the toes and ankles stretch    PATIENT EDUCATION:  Education details: POC Person educated: Patient Education method: Explanation Education comprehension: verbalized understanding   HOME EXERCISE PROGRAM: TBD  ASSESSMENT:  CLINICAL IMPRESSION: Patient having some increased pain with the cold damp weather pain in the knees and the back.  I continue to try to increase her activities and her tolerance to activity and work on her overall strength and function  OBJECTIVE IMPAIRMENTS:  Abnormal gait, cardiopulmonary status limiting activity, decreased activity tolerance, decreased balance, decreased endurance, decreased mobility, difficulty walking, decreased ROM, decreased strength, increased muscle spasms, impaired flexibility, postural dysfunction, and pain.    REHAB POTENTIAL: Good  CLINICAL DECISION MAKING: Stable/uncomplicated  EVALUATION COMPLEXITY: Low   GOALS: Goals reviewed with patient? Yes  SHORT TERM GOALS: Target date: 06/24/22  Independent with initial HEP Goal status: met  LONG TERM GOALS: Target date: 09/02/22  Decrease pain 50% Goal status: ongoing  2.  Independent with an advanced HEP  Goal status:ongoing  3.  Decrease TUG time to 13 seconds Goal status: progressing  4.  Increase LE strength to 4/5 Goal status: ongoing  5.  Walk 10 minutes with SPC and without rest Goal status:  progressing  PLAN: PT FREQUENCY: 1-2x/week  PT DURATION: 12 weeks  PLANNED INTERVENTIONS: Therapeutic exercises, Therapeutic activity, Neuromuscular re-education, Balance training, Gait training, Patient/Family education, Self Care, Joint mobilization, Stair training, Dry Needling, Electrical stimulation, Moist heat, and Manual therapy.  PLAN FOR NEXT SESSION: continue to push her function Jearld Lesch, PT 08/13/2022, 12:51 PM

## 2022-08-14 DIAGNOSIS — H401132 Primary open-angle glaucoma, bilateral, moderate stage: Secondary | ICD-10-CM | POA: Diagnosis not present

## 2022-08-15 ENCOUNTER — Encounter: Payer: Self-pay | Admitting: Physical Therapy

## 2022-08-15 ENCOUNTER — Ambulatory Visit: Payer: Medicare Other | Admitting: Physical Therapy

## 2022-08-15 DIAGNOSIS — M542 Cervicalgia: Secondary | ICD-10-CM | POA: Diagnosis not present

## 2022-08-15 DIAGNOSIS — M62830 Muscle spasm of back: Secondary | ICD-10-CM | POA: Diagnosis not present

## 2022-08-15 DIAGNOSIS — M5459 Other low back pain: Secondary | ICD-10-CM | POA: Diagnosis not present

## 2022-08-15 DIAGNOSIS — R262 Difficulty in walking, not elsewhere classified: Secondary | ICD-10-CM | POA: Diagnosis not present

## 2022-08-15 NOTE — Therapy (Signed)
OUTPATIENT PHYSICAL THERAPY THORACOLUMBAR TREATMENT    Patient Name: Linda Mcgrath MRN: [id number] DOB:1932-12-15, 88 y.o., female Today's Date: 08/15/2022   PT End of Session - 08/15/22 1252     Visit Number 15    Date for PT Re-Evaluation 09/10/22    Authorization Type [employer]    PT Start Time 1253    PT Stop Time 1340    PT Time Calculation (min) 47 min    Activity Tolerance Patient tolerated treatment well    Behavior During Therapy [facility] for tasks assessed/performed             Past Medical History:  Diagnosis Date   Allergic rhinitis    Arthritis    Asthma    Cataract    Bil/lens implant   Colon polyp 1999   small polyp   Diverticulosis    Dysrhythmia    Fibromyalgia    Hyperlipidemia    Hypothyroidism    Internal hemorrhoids    Leg cramps    Lichen planus    Lung nodule    stable LUL 9 mm   Menopausal syndrome    Osteoarthritis    UTI (urinary tract infection)    Past Surgical History:  Procedure Laterality Date   Abd U/S  11/1998   negative   Abd U/S  01/2001   gallbladder polyps   ABDOMINAL HYSTERECTOMY  1975   total-fibroids   APPENDECTOMY  1975   BREAST BIOPSY     benign   BREAST EXCISIONAL BIOPSY Left 1957   CARDIOVERSION N/A 03/07/2016   Procedure: CARDIOVERSION;  Surgeon: Yates Decamp, MD;  Location: [facility] ENDOSCOPY;  Service: Cardiovascular;  Laterality: N/A;   CATARACT EXTRACTION     bilateral   CHOLECYSTECTOMY     COLONOSCOPY  1998   Diverticulosis; polyp\   COLONOSCOPY  09/2002   Diverticulosis, hem   COLONOSCOPY  12/2007   diverticulosis, polyp   DEXA  10/2001   osteopenia   ELECTROPHYSIOLOGIC STUDY N/A 04/03/2016   Procedure: A-Flutter Ablation;  Surgeon: Will [name], MD;  Location: [facility] INVASIVE CV LAB;  Service: Cardiovascular;  Laterality: N/A;   ESOPHAGOGASTRODUODENOSCOPY  2004   Hida scan  01/2001   Negative   KNEE SURGERY     right knee / 11/2004   LAMINECTOMY WITH POSTERIOR LATERAL ARTHRODESIS LEVEL 2  N/A 07/31/2017   Procedure: DECOMPRESSIVE LAMINECTOMY LUMABR FOUR-FIVE, LUMBAR FIVE-SACRAL ONE;  Surgeon: Tia Alert, MD;  Location: [facility] OR;  Service: Neurosurgery;  Laterality: N/A;   laser surgery for glaucoma Left 09/21/2014   MULTIPLE TOOTH EXTRACTIONS     rentinal tear     ROTATOR CUFF REPAIR     x 2 /right shoulder   SKIN CANCER EXCISION     pre-melanoma / on face   TEE WITHOUT CARDIOVERSION N/A 03/07/2016   Procedure: TRANSESOPHAGEAL ECHOCARDIOGRAM (TEE);  Surgeon: Yates Decamp, MD;  Location: [facility] ENDOSCOPY;  Service: Cardiovascular;  Laterality: N/A;   TOE SURGERY     rt foot    Patient Active Problem List   Diagnosis Date Noted   Acute cystitis without hematuria 03/26/2022   Vaginal itching 03/26/2022   Urinary frequency 03/26/2022   Vaginal candida 03/26/2022   Chronic upper back pain 08/30/2020   Aortic atherosclerosis (HCC) 05/31/2020   Abnormal CT scan, small bowel 05/18/2020   Insomnia 12/15/2019   Chronic low back pain 10/18/2019   Osteoarthritis of right knee 08/02/2019   S/P lumbar spinal fusion 07/31/2017   H/O atrial flutter 03/06/2016  Obesity 11/06/2015   Urge incontinence 01/24/2015   Estrogen deficiency 10/25/2014   Lichen planus 12/20/2013   Prediabetes 06/03/2011   HYPERTENSION, BENIGN ESSENTIAL 10/23/2010   OSTEOARTHRITIS, HANDS, BILATERAL 01/24/2010   Hypothyroidism 08/24/2008   Vitamin D deficiency 06/06/2008   Hyperlipidemia 06/06/2008   MENOPAUSAL SYNDROME 03/29/2008   Osteoporosis 03/29/2008   Allergic rhinitis 01/25/2008   IBS 01/25/2008   OSTEOARTHRITIS 01/25/2008   FIBROMYALGIA 01/25/2008    PCP: [employer]  REFERRING PROVIDER: [name]  REFERRING DIAG: Back pain, difficulty walking, osteoporosis  Rationale for Evaluation and Treatment Rehabilitation  THERAPY DIAG:  Other low back pain  Muscle spasm of back  Cervicalgia  Difficulty in walking, not elsewhere classified  ONSET DATE: September 2023  SUBJECTIVE:                                                                                                                                                                                            SUBJECTIVE STATEMENT: Patient reports that her left shoulder and knee are hurting more today, unsure of why  PERTINENT HISTORY:  See above  PAIN:  Are you having pain? Yes: NPRS scale: 6/10 Pain location: low back, right knee Pain description: ache, sore Aggravating factors: yardwork, hosuework pain up to 8/10 Relieving factors: rest, pain can be 0/10 at times   PRECAUTIONS: None  WEIGHT BEARING RESTRICTIONS: No  FALLS:  Has patient fallen in last 6 months? No  LIVING ENVIRONMENT: Lives with: lives with their family and lives alone Lives in: House/apartment Stairs: No Has following equipment at home: Single point cane  OCCUPATION: retired  PLOF: Independent able to do yardwork and housework  PATIENT GOALS: walk better, less pain   OBJECTIVE:   DIAGNOSTIC FINDINGS:  osteoporosis  PATIENT SURVEYS:  FOTO 31  COGNITION:  Overall cognitive status: Within functional limits for tasks assessed     SENSATION: WFL  MUSCLE LENGTH: Tight calves, HS and piriformis  POSTURE: rounded shoulders, forward head, and decreased lumbar lordosis  PALPATION: She is tight and tender in the lumbar area and into the thoracic and upper trap area, mild tenderness in the buttocks  LUMBAR ROM:   AROM eval  Flexion Decreased 50%  Extension Decreased 100% with pain  Right lateral flexion   Left lateral flexion   Right rotation   Left rotation    (Blank rows = not tested)  LOWER EXTREMITY ROM:     Active  Right eval Left eval  Hip flexion    Hip extension    Hip abduction    Hip adduction    Hip internal rotation    Hip external rotation  Knee flexion    Knee extension    Ankle dorsiflexion    Ankle plantarflexion    Ankle inversion    Ankle eversion     (Blank rows = not tested)  LOWER EXTREMITY  MMT:    MMT Right eval Left eval  Hip flexion 3+ 3+  Hip extension    Hip abduction 3+ 3+  Hip adduction    Hip internal rotation    Hip external rotation    Knee flexion 3+ with pain 3+  Knee extension 3+ with pain 3+  Ankle dorsiflexion 4- 4-  Ankle plantarflexion    Ankle inversion    Ankle eversion     (Blank rows = not tested)  FUNCTIONAL TESTS:  5 times sit to stand: 31 seconds Timed up and go (TUG): 26 seconds with [facility]   07/25/22 TUG 22 seconds with SPC  GAIT: Distance walked: 60 feet Assistive device utilized: Single point cane Level of assistance: SBA Comments: slow, slightly stooped    TODAY'S TREATMENT:  [facility] Adult PT Treatment:                                                                                                                             08/15/22 Nustep Level 4 x 7 minutes Red tband HS curls 2.5# LAQ 2.5# marches 2.5# hip abduction Passive HS and calf stretches Pball roll outs Pball isometric abs Yellow tband ER/IR shoulders Standing calf raises and toe raises holding onto cabinet   08/12/22 Nustep level 5 x 7 minutes 20# HS curls 2.5# LAQ Red tband hip abduction Red tband rows 2.5# marches Pball roll outs Shrugs Seated toe raises and heel raises STM tot he low back and into the rhomboids   08/08/22 Nustep level 5 x 7 minutes Shrugs Shoulder rolls Pball roll outs Red tband HS curls Seated marches and hip abduction Toe raise and heel raise in sitting STM to the calves the right low back  08/06/22 Nustep level 4 x 7 minutes Leg curls 20# 2x10 LAQ 2# Marches 2# Seated hip abduction 2# Seated red tband rows and extensions STM to the calves and feet, PROM of the toes and ankles STM to the low back  08/01/22 Nustep level5 x 7 minutes 15# leg curls 3x10 2.5# LAQ 3x10 2.5# seated marches and hip abduction Ball b/n knees squeeze Green tband clamshells Pelvic clocks On sit fit rows and extension with red tband STM to  the low back in sitting  07/30/22 Nustep level 5 x 7 minutes Leg curls 10# 3x10 2.5# LAQ 2.5# seated hip abduction 2.5# seated marches Pelvic mobility with stability exercises on sit fit Green tband clamshells Green tband HS curls Walking 220 feet without device in the clinic STM to the calves, passive ROM of the toes and ankles stretch    PATIENT EDUCATION:  Education details: POC Person educated: Patient Education method: Explanation Education comprehension: verbalized understanding   HOME EXERCISE PROGRAM: TBD  ASSESSMENT:  CLINICAL IMPRESSION: Patient continues to have some increased pain in the shoulder and the knee, she reports that in the morning recently she has had difficulty with the first few steps, I am working to challenge her mms to build strength and support the joints without causing pain  OBJECTIVE IMPAIRMENTS: Abnormal gait, cardiopulmonary status limiting activity, decreased activity tolerance, decreased balance, decreased endurance, decreased mobility, difficulty walking, decreased ROM, decreased strength, increased muscle spasms, impaired flexibility, postural dysfunction, and pain.    REHAB POTENTIAL: Good  CLINICAL DECISION MAKING: Stable/uncomplicated  EVALUATION COMPLEXITY: Low   GOALS: Goals reviewed with patient? Yes  SHORT TERM GOALS: Target date: 06/24/22  Independent with initial HEP Goal status: met  LONG TERM GOALS: Target date: 09/02/22  Decrease pain 50% Goal status: ongoing  2.  Independent with an advanced HEP  Goal status:ongoing  3.  Decrease TUG time to 13 seconds Goal status: progressing  4.  Increase LE strength to 4/5 Goal status: progressing  5.  Walk 10 minutes with SPC and without rest Goal status:  progressing  PLAN: PT FREQUENCY: 1-2x/week  PT DURATION: 12 weeks  PLANNED INTERVENTIONS: Therapeutic exercises, Therapeutic activity, Neuromuscular re-education, Balance training, Gait training,  Patient/Family education, Self Care, Joint mobilization, Stair training, Dry Needling, Electrical stimulation, Moist heat, and Manual therapy.  PLAN FOR NEXT SESSION: work to help with pain and increase overall function Jearld Lesch, PT 08/15/2022, 12:52 PM

## 2022-08-20 ENCOUNTER — Encounter: Payer: Self-pay | Admitting: Physical Therapy

## 2022-08-20 ENCOUNTER — Ambulatory Visit: Payer: Medicare Other | Admitting: Physical Therapy

## 2022-08-20 DIAGNOSIS — M542 Cervicalgia: Secondary | ICD-10-CM

## 2022-08-20 DIAGNOSIS — M5459 Other low back pain: Secondary | ICD-10-CM

## 2022-08-20 DIAGNOSIS — M62830 Muscle spasm of back: Secondary | ICD-10-CM | POA: Diagnosis not present

## 2022-08-20 DIAGNOSIS — R262 Difficulty in walking, not elsewhere classified: Secondary | ICD-10-CM | POA: Diagnosis not present

## 2022-08-20 NOTE — Therapy (Signed)
OUTPATIENT PHYSICAL THERAPY THORACOLUMBAR TREATMENT    Patient Name: Linda Mcgrath MRN: [id number] DOB:12-18-1932, 88 y.o., female Today's Date: 08/20/2022   PT End of Session - 08/20/22 1301     Visit Number 16    Date for PT Re-Evaluation 09/10/22    Authorization Type [employer]    PT Start Time 1253    PT Stop Time 1345    PT Time Calculation (min) 52 min    Activity Tolerance Patient tolerated treatment well    Behavior During Therapy [facility] for tasks assessed/performed             Past Medical History:  Diagnosis Date   Allergic rhinitis    Arthritis    Asthma    Cataract    Bil/lens implant   Colon polyp 1999   small polyp   Diverticulosis    Dysrhythmia    Fibromyalgia    Hyperlipidemia    Hypothyroidism    Internal hemorrhoids    Leg cramps    Lichen planus    Lung nodule    stable LUL 9 mm   Menopausal syndrome    Osteoarthritis    UTI (urinary tract infection)    Past Surgical History:  Procedure Laterality Date   Abd U/S  11/1998   negative   Abd U/S  01/2001   gallbladder polyps   ABDOMINAL HYSTERECTOMY  1975   total-fibroids   APPENDECTOMY  1975   BREAST BIOPSY     benign   BREAST EXCISIONAL BIOPSY Left 1957   CARDIOVERSION N/A 03/07/2016   Procedure: CARDIOVERSION;  Surgeon: Yates Decamp, MD;  Location: [facility] ENDOSCOPY;  Service: Cardiovascular;  Laterality: N/A;   CATARACT EXTRACTION     bilateral   CHOLECYSTECTOMY     COLONOSCOPY  1998   Diverticulosis; polyp\   COLONOSCOPY  09/2002   Diverticulosis, hem   COLONOSCOPY  12/2007   diverticulosis, polyp   DEXA  10/2001   osteopenia   ELECTROPHYSIOLOGIC STUDY N/A 04/03/2016   Procedure: A-Flutter Ablation;  Surgeon: Will [name], MD;  Location: [facility] INVASIVE CV LAB;  Service: Cardiovascular;  Laterality: N/A;   ESOPHAGOGASTRODUODENOSCOPY  2004   Hida scan  01/2001   Negative   KNEE SURGERY     right knee / 11/2004   LAMINECTOMY WITH POSTERIOR LATERAL ARTHRODESIS LEVEL 2  N/A 07/31/2017   Procedure: DECOMPRESSIVE LAMINECTOMY LUMABR FOUR-FIVE, LUMBAR FIVE-SACRAL ONE;  Surgeon: Tia Alert, MD;  Location: [facility] OR;  Service: Neurosurgery;  Laterality: N/A;   laser surgery for glaucoma Left 09/21/2014   MULTIPLE TOOTH EXTRACTIONS     rentinal tear     ROTATOR CUFF REPAIR     x 2 /right shoulder   SKIN CANCER EXCISION     pre-melanoma / on face   TEE WITHOUT CARDIOVERSION N/A 03/07/2016   Procedure: TRANSESOPHAGEAL ECHOCARDIOGRAM (TEE);  Surgeon: Yates Decamp, MD;  Location: [facility] ENDOSCOPY;  Service: Cardiovascular;  Laterality: N/A;   TOE SURGERY     rt foot    Patient Active Problem List   Diagnosis Date Noted   Acute cystitis without hematuria 03/26/2022   Vaginal itching 03/26/2022   Urinary frequency 03/26/2022   Vaginal candida 03/26/2022   Chronic upper back pain 08/30/2020   Aortic atherosclerosis (HCC) 05/31/2020   Abnormal CT scan, small bowel 05/18/2020   Insomnia 12/15/2019   Chronic low back pain 10/18/2019   Osteoarthritis of right knee 08/02/2019   S/P lumbar spinal fusion 07/31/2017   H/O atrial flutter 03/06/2016  Obesity 11/06/2015   Urge incontinence 01/24/2015   Estrogen deficiency 10/25/2014   Lichen planus 12/20/2013   Prediabetes 06/03/2011   HYPERTENSION, BENIGN ESSENTIAL 10/23/2010   OSTEOARTHRITIS, HANDS, BILATERAL 01/24/2010   Hypothyroidism 08/24/2008   Vitamin D deficiency 06/06/2008   Hyperlipidemia 06/06/2008   MENOPAUSAL SYNDROME 03/29/2008   Osteoporosis 03/29/2008   Allergic rhinitis 01/25/2008   IBS 01/25/2008   OSTEOARTHRITIS 01/25/2008   FIBROMYALGIA 01/25/2008    PCP: [employer]  REFERRING PROVIDER: [name]  REFERRING DIAG: Back pain, difficulty walking, osteoporosis  Rationale for Evaluation and Treatment Rehabilitation  THERAPY DIAG:  Other low back pain  Muscle spasm of back  Cervicalgia  Difficulty in walking, not elsewhere classified  ONSET DATE: September 2023  SUBJECTIVE:                                                                                                                                                                                            SUBJECTIVE STATEMENT: Patient reports she did not do anything over the holiday weekend, reports that she is in less pain but feels stiff and tired PERTINENT HISTORY:  See above  PAIN:  Are you having pain? Yes: NPRS scale: 4/10 Pain location: low back, right knee Pain description: ache, sore Aggravating factors: yardwork, hosuework pain up to 8/10 Relieving factors: rest, pain can be 0/10 at times   PRECAUTIONS: None  WEIGHT BEARING RESTRICTIONS: No  FALLS:  Has patient fallen in last 6 months? No  LIVING ENVIRONMENT: Lives with: lives with their family and lives alone Lives in: House/apartment Stairs: No Has following equipment at home: Single point cane  OCCUPATION: retired  PLOF: Independent able to do yardwork and housework  PATIENT GOALS: walk better, less pain   OBJECTIVE:   DIAGNOSTIC FINDINGS:  osteoporosis  PATIENT SURVEYS:  FOTO 31  COGNITION:  Overall cognitive status: Within functional limits for tasks assessed     SENSATION: WFL  MUSCLE LENGTH: Tight calves, HS and piriformis  POSTURE: rounded shoulders, forward head, and decreased lumbar lordosis  PALPATION: She is tight and tender in the lumbar area and into the thoracic and upper trap area, mild tenderness in the buttocks  LUMBAR ROM:   AROM eval  Flexion Decreased 50%  Extension Decreased 100% with pain  Right lateral flexion   Left lateral flexion   Right rotation   Left rotation    (Blank rows = not tested)  LOWER EXTREMITY ROM:     Active  Right eval Left eval  Hip flexion    Hip extension    Hip abduction    Hip adduction    Hip internal rotation  Hip external rotation    Knee flexion    Knee extension    Ankle dorsiflexion    Ankle plantarflexion    Ankle inversion    Ankle eversion     (Blank  rows = not tested)  LOWER EXTREMITY MMT:    MMT Right eval Left eval  Hip flexion 3+ 3+  Hip extension    Hip abduction 3+ 3+  Hip adduction    Hip internal rotation    Hip external rotation    Knee flexion 3+ with pain 3+  Knee extension 3+ with pain 3+  Ankle dorsiflexion 4- 4-  Ankle plantarflexion    Ankle inversion    Ankle eversion     (Blank rows = not tested)  FUNCTIONAL TESTS:  5 times sit to stand: 31 seconds Timed up and go (TUG): 26 seconds with [facility]   07/25/22 TUG 22 seconds with SPC, 08/20/22 18 seconds with SPC  GAIT: Distance walked: 60 feet Assistive device utilized: Single point cane Level of assistance: SBA Comments: slow, slightly stooped    TODAY'S TREATMENT:  [facility] Adult PT Treatment:                                                                                                                             08/20/22 Nustep level 5 x 7 minutes Leg curls 15# 2x15 TUG 18 seconds with SPC LAQ 2.5# 2x10 Marches 2.5# 2x10 Seated hip abduction 2.5# Red tband rows and extension Toe and heel raises Passive stretch of the HS and calves   08/15/22 Nustep Level 4 x 7 minutes Red tband HS curls 2.5# LAQ 2.5# marches 2.5# hip abduction Passive HS and calf stretches Pball roll outs Pball isometric abs Yellow tband ER/IR shoulders Standing calf raises and toe raises holding onto cabinet   08/12/22 Nustep level 5 x 7 minutes 20# HS curls 2.5# LAQ Red tband hip abduction Red tband rows 2.5# marches Pball roll outs Shrugs Seated toe raises and heel raises STM tot he low back and into the rhomboids   08/08/22 Nustep level 5 x 7 minutes Shrugs Shoulder rolls Pball roll outs Red tband HS curls Seated marches and hip abduction Toe raise and heel raise in sitting STM to the calves the right low back  08/06/22 Nustep level 4 x 7 minutes Leg curls 20# 2x10 LAQ 2# Marches 2# Seated hip abduction 2# Seated red tband rows and  extensions STM to the calves and feet, PROM of the toes and ankles STM to the low back  PATIENT EDUCATION:  Education details: POC Person educated: Patient Education method: Explanation Education comprehension: verbalized understanding   HOME EXERCISE PROGRAM: TBD  ASSESSMENT:  CLINICAL IMPRESSION: Patient reports a little better with pain but is much more stiff and tired, reports that she really did not do much over the past 5 days.  TUG time decreased another 4 seconds  OBJECTIVE IMPAIRMENTS: Abnormal gait, cardiopulmonary status limiting activity, decreased activity tolerance,  decreased balance, decreased endurance, decreased mobility, difficulty walking, decreased ROM, decreased strength, increased muscle spasms, impaired flexibility, postural dysfunction, and pain.    REHAB POTENTIAL: Good  CLINICAL DECISION MAKING: Stable/uncomplicated  EVALUATION COMPLEXITY: Low   GOALS: Goals reviewed with patient? Yes  SHORT TERM GOALS: Target date: 06/24/22  Independent with initial HEP Goal status: met  LONG TERM GOALS: Target date: 09/02/22  Decrease pain 50% Goal status: progressing  2.  Independent with an advanced HEP  Goal status:ongoing  3.  Decrease TUG time to 13 seconds Goal status: progressing  4.  Increase LE strength to 4/5 Goal status: progressing  5.  Walk 10 minutes with SPC and without rest Goal status:  progressing  PLAN: PT FREQUENCY: 1-2x/week  PT DURATION: 12 weeks  PLANNED INTERVENTIONS: Therapeutic exercises, Therapeutic activity, Neuromuscular re-education, Balance training, Gait training, Patient/Family education, Self Care, Joint mobilization, Stair training, Dry Needling, Electrical stimulation, Moist heat, and Manual therapy.  PLAN FOR NEXT SESSION: work to help with pain and increase overall function Janice Bodine W, PT 08/20/2022, 1:05 PM

## 2022-08-22 ENCOUNTER — Ambulatory Visit: Payer: Medicare Other | Admitting: Physical Therapy

## 2022-08-22 ENCOUNTER — Encounter: Payer: Self-pay | Admitting: Physical Therapy

## 2022-08-22 DIAGNOSIS — R262 Difficulty in walking, not elsewhere classified: Secondary | ICD-10-CM | POA: Diagnosis not present

## 2022-08-22 DIAGNOSIS — M5459 Other low back pain: Secondary | ICD-10-CM

## 2022-08-22 DIAGNOSIS — M542 Cervicalgia: Secondary | ICD-10-CM

## 2022-08-22 DIAGNOSIS — M62830 Muscle spasm of back: Secondary | ICD-10-CM | POA: Diagnosis not present

## 2022-08-22 NOTE — Therapy (Signed)
OUTPATIENT PHYSICAL THERAPY THORACOLUMBAR TREATMENT    Patient Name: KARISS RUPAR MRN: [id number] DOB:15-Mar-1933, 88 y.o., female Today's Date: 08/22/2022   PT End of Session - 08/22/22 1247     Visit Number 17    Date for PT Re-Evaluation 09/10/22    Authorization Type [employer]    PT Start Time 1247    PT Stop Time 1335    PT Time Calculation (min) 48 min    Activity Tolerance Patient tolerated treatment well    Behavior During Therapy [facility] for tasks assessed/performed             Past Medical History:  Diagnosis Date   Allergic rhinitis    Arthritis    Asthma    Cataract    Bil/lens implant   Colon polyp 1999   small polyp   Diverticulosis    Dysrhythmia    Fibromyalgia    Hyperlipidemia    Hypothyroidism    Internal hemorrhoids    Leg cramps    Lichen planus    Lung nodule    stable LUL 9 mm   Menopausal syndrome    Osteoarthritis    UTI (urinary tract infection)    Past Surgical History:  Procedure Laterality Date   Abd U/S  11/1998   negative   Abd U/S  01/2001   gallbladder polyps   ABDOMINAL HYSTERECTOMY  1975   total-fibroids   APPENDECTOMY  1975   BREAST BIOPSY     benign   BREAST EXCISIONAL BIOPSY Left 1957   CARDIOVERSION N/A 03/07/2016   Procedure: CARDIOVERSION;  Surgeon: Yates Decamp, MD;  Location: [facility] ENDOSCOPY;  Service: Cardiovascular;  Laterality: N/A;   CATARACT EXTRACTION     bilateral   CHOLECYSTECTOMY     COLONOSCOPY  1998   Diverticulosis; polyp\   COLONOSCOPY  09/2002   Diverticulosis, hem   COLONOSCOPY  12/2007   diverticulosis, polyp   DEXA  10/2001   osteopenia   ELECTROPHYSIOLOGIC STUDY N/A 04/03/2016   Procedure: A-Flutter Ablation;  Surgeon: Will [name], MD;  Location: [facility] INVASIVE CV LAB;  Service: Cardiovascular;  Laterality: N/A;   ESOPHAGOGASTRODUODENOSCOPY  2004   Hida scan  01/2001   Negative   KNEE SURGERY     right knee / 11/2004   LAMINECTOMY WITH POSTERIOR LATERAL ARTHRODESIS LEVEL 2  N/A 07/31/2017   Procedure: DECOMPRESSIVE LAMINECTOMY LUMABR FOUR-FIVE, LUMBAR FIVE-SACRAL ONE;  Surgeon: Tia Alert, MD;  Location: [facility] OR;  Service: Neurosurgery;  Laterality: N/A;   laser surgery for glaucoma Left 09/21/2014   MULTIPLE TOOTH EXTRACTIONS     rentinal tear     ROTATOR CUFF REPAIR     x 2 /right shoulder   SKIN CANCER EXCISION     pre-melanoma / on face   TEE WITHOUT CARDIOVERSION N/A 03/07/2016   Procedure: TRANSESOPHAGEAL ECHOCARDIOGRAM (TEE);  Surgeon: Yates Decamp, MD;  Location: [facility] ENDOSCOPY;  Service: Cardiovascular;  Laterality: N/A;   TOE SURGERY     rt foot    Patient Active Problem List   Diagnosis Date Noted   Acute cystitis without hematuria 03/26/2022   Vaginal itching 03/26/2022   Urinary frequency 03/26/2022   Vaginal candida 03/26/2022   Chronic upper back pain 08/30/2020   Aortic atherosclerosis (HCC) 05/31/2020   Abnormal CT scan, small bowel 05/18/2020   Insomnia 12/15/2019   Chronic low back pain 10/18/2019   Osteoarthritis of right knee 08/02/2019   S/P lumbar spinal fusion 07/31/2017   H/O atrial flutter 03/06/2016  Obesity 11/06/2015   Urge incontinence 01/24/2015   Estrogen deficiency 10/25/2014   Lichen planus 12/20/2013   Prediabetes 06/03/2011   HYPERTENSION, BENIGN ESSENTIAL 10/23/2010   OSTEOARTHRITIS, HANDS, BILATERAL 01/24/2010   Hypothyroidism 08/24/2008   Vitamin D deficiency 06/06/2008   Hyperlipidemia 06/06/2008   MENOPAUSAL SYNDROME 03/29/2008   Osteoporosis 03/29/2008   Allergic rhinitis 01/25/2008   IBS 01/25/2008   OSTEOARTHRITIS 01/25/2008   FIBROMYALGIA 01/25/2008    PCP: [employer]  REFERRING PROVIDER: [name]  REFERRING DIAG: Back pain, difficulty walking, osteoporosis  Rationale for Evaluation and Treatment Rehabilitation  THERAPY DIAG:  Other low back pain  Muscle spasm of back  Cervicalgia  Difficulty in walking, not elsewhere classified  ONSET DATE: September 2023  SUBJECTIVE:                                                                                                                                                                                            SUBJECTIVE STATEMENT: I did not leave the house yesterday with the rain, so I am really stiff, knees are sore  PERTINENT HISTORY:  See above  PAIN:  Are you having pain? Yes: NPRS scale: 4/10 Pain location: low back, right knee Pain description: ache, sore Aggravating factors: yardwork, hosuework pain up to 8/10 Relieving factors: rest, pain can be 0/10 at times   PRECAUTIONS: None  WEIGHT BEARING RESTRICTIONS: No  FALLS:  Has patient fallen in last 6 months? No  LIVING ENVIRONMENT: Lives with: lives with their family and lives alone Lives in: House/apartment Stairs: No Has following equipment at home: Single point cane  OCCUPATION: retired  PLOF: Independent able to do yardwork and housework  PATIENT GOALS: walk better, less pain   OBJECTIVE:   DIAGNOSTIC FINDINGS:  osteoporosis  PATIENT SURVEYS:  FOTO 31  COGNITION:  Overall cognitive status: Within functional limits for tasks assessed     SENSATION: WFL  MUSCLE LENGTH: Tight calves, HS and piriformis  POSTURE: rounded shoulders, forward head, and decreased lumbar lordosis  PALPATION: She is tight and tender in the lumbar area and into the thoracic and upper trap area, mild tenderness in the buttocks  LUMBAR ROM:   AROM eval  Flexion Decreased 50%  Extension Decreased 100% with pain  Right lateral flexion   Left lateral flexion   Right rotation   Left rotation    (Blank rows = not tested)  LOWER EXTREMITY ROM:     Active  Right eval Left eval  Hip flexion    Hip extension    Hip abduction    Hip adduction    Hip internal rotation    Hip  external rotation    Knee flexion    Knee extension    Ankle dorsiflexion    Ankle plantarflexion    Ankle inversion    Ankle eversion     (Blank rows = not tested)  LOWER  EXTREMITY MMT:    MMT Right eval Left eval  Hip flexion 3+ 3+  Hip extension    Hip abduction 3+ 3+  Hip adduction    Hip internal rotation    Hip external rotation    Knee flexion 3+ with pain 3+  Knee extension 3+ with pain 3+  Ankle dorsiflexion 4- 4-  Ankle plantarflexion    Ankle inversion    Ankle eversion     (Blank rows = not tested)  FUNCTIONAL TESTS:  5 times sit to stand: 31 seconds Timed up and go (TUG): 26 seconds with [facility]   07/25/22 TUG 22 seconds with SPC, 08/20/22 18 seconds with SPC  GAIT: Distance walked: 60 feet Assistive device utilized: Single point cane Level of assistance: SBA Comments: slow, slightly stooped    TODAY'S TREATMENT:  [facility] Adult PT Treatment:                                                                                                                             08/22/22 Nustep level 5 x 7 minutes Leg curls 15# REd tband shoulder extension and rows Pelvic mobility and clocks 2.5# LAQ 2.5# marches P ball roll outs 3 ways Pball isometric abs Standing toe raise and calf raise Walking without device 220 feet x 2 SBA, slow pace   08/20/22 Nustep level 5 x 7 minutes Leg curls 15# 2x15 TUG 18 seconds with SPC LAQ 2.5# 2x10 Marches 2.5# 2x10 Seated hip abduction 2.5# Red tband rows and extension Toe and heel raises Passive stretch of the HS and calves   08/15/22 Nustep Level 4 x 7 minutes Red tband HS curls 2.5# LAQ 2.5# marches 2.5# hip abduction Passive HS and calf stretches Pball roll outs Pball isometric abs Yellow tband ER/IR shoulders Standing calf raises and toe raises holding onto cabinet   08/12/22 Nustep level 5 x 7 minutes 20# HS curls 2.5# LAQ Red tband hip abduction Red tband rows 2.5# marches Pball roll outs Shrugs Seated toe raises and heel raises STM tot he low back and into the rhomboids   08/08/22 Nustep level 5 x 7 minutes Shrugs Shoulder rolls Pball roll outs Red tband HS  curls Seated marches and hip abduction Toe raise and heel raise in sitting STM to the calves the right low back  08/06/22 Nustep level 4 x 7 minutes Leg curls 20# 2x10 LAQ 2# Marches 2# Seated hip abduction 2# Seated red tband rows and extensions STM to the calves and feet, PROM of the toes and ankles STM to the low back  PATIENT EDUCATION:  Education details: POC Person educated: Patient Education method: Explanation Education comprehension: verbalized understanding   HOME EXERCISE PROGRAM: TBD  ASSESSMENT:  CLINICAL IMPRESSION: Patient continues to have some stiffness and pain in the knees, she thinks it is the cooler weather moving in.  She was able walk wihtout the SPC > 200 feet but a slow pace and needed SBA  OBJECTIVE IMPAIRMENTS: Abnormal gait, cardiopulmonary status limiting activity, decreased activity tolerance, decreased balance, decreased endurance, decreased mobility, difficulty walking, decreased ROM, decreased strength, increased muscle spasms, impaired flexibility, postural dysfunction, and pain.    REHAB POTENTIAL: Good  CLINICAL DECISION MAKING: Stable/uncomplicated  EVALUATION COMPLEXITY: Low   GOALS: Goals reviewed with patient? Yes  SHORT TERM GOALS: Target date: 06/24/22  Independent with initial HEP Goal status: met  LONG TERM GOALS: Target date: 09/02/22  Decrease pain 50% Goal status: progressing  2.  Independent with an advanced HEP  Goal status:ongoing  3.  Decrease TUG time to 13 seconds Goal status: progressing  4.  Increase LE strength to 4/5 Goal status: progressing  5.  Walk 10 minutes with SPC and without rest Goal status:  progressing  PLAN: PT FREQUENCY: 1-2x/week  PT DURATION: 12 weeks  PLANNED INTERVENTIONS: Therapeutic exercises, Therapeutic activity, Neuromuscular re-education, Balance training, Gait training, Patient/Family education, Self Care, Joint mobilization, Stair training, Dry Needling, Electrical  stimulation, Moist heat, and Manual therapy.  PLAN FOR NEXT SESSION: work to help with pain and increase overall function Jearld Lesch, PT 08/22/2022, 12:47 PM

## 2022-08-27 ENCOUNTER — Encounter: Payer: Self-pay | Admitting: Physical Therapy

## 2022-08-27 ENCOUNTER — Ambulatory Visit: Payer: Medicare Other | Attending: Family Medicine | Admitting: Physical Therapy

## 2022-08-27 DIAGNOSIS — M62830 Muscle spasm of back: Secondary | ICD-10-CM | POA: Diagnosis not present

## 2022-08-27 DIAGNOSIS — R262 Difficulty in walking, not elsewhere classified: Secondary | ICD-10-CM | POA: Insufficient documentation

## 2022-08-27 DIAGNOSIS — M5459 Other low back pain: Secondary | ICD-10-CM | POA: Diagnosis not present

## 2022-08-27 DIAGNOSIS — M542 Cervicalgia: Secondary | ICD-10-CM | POA: Insufficient documentation

## 2022-08-27 NOTE — Therapy (Signed)
OUTPATIENT PHYSICAL THERAPY THORACOLUMBAR TREATMENT    Patient Name: Linda Mcgrath MRN: [id number] DOB:[birth year], 89 y.o., female Today's Date: 08/27/2022   PT End of Session - 08/27/22 1253     Visit Number 18    Date for PT Re-Evaluation 09/10/22    Authorization Type [employer]    PT Start Time 1254    PT Stop Time 1344    PT Time Calculation (min) 50 min    Activity Tolerance Patient tolerated treatment well    Behavior During Therapy [facility] for tasks assessed/performed             Past Medical History:  Diagnosis Date   Allergic rhinitis    Arthritis    Asthma    Cataract    Bil/lens implant   Colon polyp 1999   small polyp   Diverticulosis    Dysrhythmia    Fibromyalgia    Hyperlipidemia    Hypothyroidism    Internal hemorrhoids    Leg cramps    Lichen planus    Lung nodule    stable LUL 9 mm   Menopausal syndrome    Osteoarthritis    UTI (urinary tract infection)    Past Surgical History:  Procedure Laterality Date   Abd U/S  11/1998   negative   Abd U/S  01/2001   gallbladder polyps   ABDOMINAL HYSTERECTOMY  1975   total-fibroids   APPENDECTOMY  1975   BREAST BIOPSY     benign   BREAST EXCISIONAL BIOPSY Left 1957   CARDIOVERSION N/A 03/07/2016   Procedure: CARDIOVERSION;  Surgeon: Yates Decamp, MD;  Location: [facility] ENDOSCOPY;  Service: Cardiovascular;  Laterality: N/A;   CATARACT EXTRACTION     bilateral   CHOLECYSTECTOMY     COLONOSCOPY  1998   Diverticulosis; polyp\   COLONOSCOPY  09/2002   Diverticulosis, hem   COLONOSCOPY  12/2007   diverticulosis, polyp   DEXA  10/2001   osteopenia   ELECTROPHYSIOLOGIC STUDY N/A 04/03/2016   Procedure: A-Flutter Ablation;  Surgeon: Will [name], MD;  Location: [facility] INVASIVE CV LAB;  Service: Cardiovascular;  Laterality: N/A;   ESOPHAGOGASTRODUODENOSCOPY  2004   Hida scan  01/2001   Negative   KNEE SURGERY     right knee / 11/2004   LAMINECTOMY WITH POSTERIOR LATERAL ARTHRODESIS LEVEL 2  N/A 07/31/2017   Procedure: DECOMPRESSIVE LAMINECTOMY LUMABR FOUR-FIVE, LUMBAR FIVE-SACRAL ONE;  Surgeon: Tia Alert, MD;  Location: [facility] OR;  Service: Neurosurgery;  Laterality: N/A;   laser surgery for glaucoma Left 09/21/2014   MULTIPLE TOOTH EXTRACTIONS     rentinal tear     ROTATOR CUFF REPAIR     x 2 /right shoulder   SKIN CANCER EXCISION     pre-melanoma / on face   TEE WITHOUT CARDIOVERSION N/A 03/07/2016   Procedure: TRANSESOPHAGEAL ECHOCARDIOGRAM (TEE);  Surgeon: Yates Decamp, MD;  Location: [facility] ENDOSCOPY;  Service: Cardiovascular;  Laterality: N/A;   TOE SURGERY     rt foot    Patient Active Problem List   Diagnosis Date Noted   Acute cystitis without hematuria 03/26/2022   Vaginal itching 03/26/2022   Urinary frequency 03/26/2022   Vaginal candida 03/26/2022   Chronic upper back pain 08/30/2020   Aortic atherosclerosis (HCC) 05/31/2020   Abnormal CT scan, small bowel 05/18/2020   Insomnia 12/15/2019   Chronic low back pain 10/18/2019   Osteoarthritis of right knee 08/02/2019   S/P lumbar spinal fusion 07/31/2017   H/O atrial flutter 03/06/2016  Obesity 11/06/2015   Urge incontinence 01/24/2015   Estrogen deficiency 10/25/2014   Lichen planus 12/20/2013   Prediabetes 06/03/2011   HYPERTENSION, BENIGN ESSENTIAL 10/23/2010   OSTEOARTHRITIS, HANDS, BILATERAL 01/24/2010   Hypothyroidism 08/24/2008   Vitamin D deficiency 06/06/2008   Hyperlipidemia 06/06/2008   MENOPAUSAL SYNDROME 03/29/2008   Osteoporosis 03/29/2008   Allergic rhinitis 01/25/2008   IBS 01/25/2008   OSTEOARTHRITIS 01/25/2008   FIBROMYALGIA 01/25/2008    PCP: [employer]  REFERRING PROVIDER: [name]  REFERRING DIAG: Back pain, difficulty walking, osteoporosis  Rationale for Evaluation and Treatment Rehabilitation  THERAPY DIAG:  Other low back pain  Muscle spasm of back  Cervicalgia  Difficulty in walking, not elsewhere classified  ONSET DATE: September 2023  SUBJECTIVE:                                                                                                                                                                                            SUBJECTIVE STATEMENT: Patient reports really not get out of the house over the past 4-5 days, reports stiff and cold   PERTINENT HISTORY:  See above  PAIN:  Are you having pain? Yes: NPRS scale: 5/10 Pain location: low back, right knee Pain description: ache, sore Aggravating factors: yardwork, hosuework pain up to 8/10 Relieving factors: rest, pain can be 0/10 at times   PRECAUTIONS: None  WEIGHT BEARING RESTRICTIONS: No  FALLS:  Has patient fallen in last 6 months? No  LIVING ENVIRONMENT: Lives with: lives with their family and lives alone Lives in: House/apartment Stairs: No Has following equipment at home: Single point cane  OCCUPATION: retired  PLOF: Independent able to do yardwork and housework  PATIENT GOALS: walk better, less pain   OBJECTIVE:   DIAGNOSTIC FINDINGS:  osteoporosis  PATIENT SURVEYS:  FOTO 31  COGNITION:  Overall cognitive status: Within functional limits for tasks assessed     SENSATION: WFL  MUSCLE LENGTH: Tight calves, HS and piriformis  POSTURE: rounded shoulders, forward head, and decreased lumbar lordosis  PALPATION: She is tight and tender in the lumbar area and into the thoracic and upper trap area, mild tenderness in the buttocks  LUMBAR ROM:   AROM eval  Flexion Decreased 50%  Extension Decreased 100% with pain  Right lateral flexion   Left lateral flexion   Right rotation   Left rotation    (Blank rows = not tested)  LOWER EXTREMITY ROM:     Active  Right eval Left eval  Hip flexion    Hip extension    Hip abduction    Hip adduction    Hip internal rotation  Hip external rotation    Knee flexion    Knee extension    Ankle dorsiflexion    Ankle plantarflexion    Ankle inversion    Ankle eversion     (Blank rows = not tested)  LOWER  EXTREMITY MMT:    MMT Right eval Left eval  Hip flexion 3+ 3+  Hip extension    Hip abduction 3+ 3+  Hip adduction    Hip internal rotation    Hip external rotation    Knee flexion 3+ with pain 3+  Knee extension 3+ with pain 3+  Ankle dorsiflexion 4- 4-  Ankle plantarflexion    Ankle inversion    Ankle eversion     (Blank rows = not tested)  FUNCTIONAL TESTS:  5 times sit to stand: 31 seconds Timed up and go (TUG): 26 seconds with [facility]   07/25/22 TUG 22 seconds with SPC, 08/20/22 18 seconds with SPC  GAIT: Distance walked: 60 feet Assistive device utilized: Single point cane Level of assistance: SBA Comments: slow, slightly stooped    TODAY'S TREATMENT:  [facility] Adult PT Treatment:                                                                                                                             08/27/22 P ball roll outs 2.5# LAQ 2.5# marches 2.5# hip abduction Pball in lap isometric abs Nustep level 5 x 6 minutes Leg curls 20# 2x10 No device 200 feet SBA walking slow pace and then up and over on the steps  08/22/22 Nustep level 5 x 7 minutes Leg curls 15# REd tband shoulder extension and rows Pelvic mobility and clocks 2.5# LAQ 2.5# marches P ball roll outs 3 ways Pball isometric abs Standing toe raise and calf raise Walking without device 220 feet x 2 SBA, slow pace   08/20/22 Nustep level 5 x 7 minutes Leg curls 15# 2x15 TUG 18 seconds with SPC LAQ 2.5# 2x10 Marches 2.5# 2x10 Seated hip abduction 2.5# Red tband rows and extension Toe and heel raises Passive stretch of the HS and calves   08/15/22 Nustep Level 4 x 7 minutes Red tband HS curls 2.5# LAQ 2.5# marches 2.5# hip abduction Passive HS and calf stretches Pball roll outs Pball isometric abs Yellow tband ER/IR shoulders Standing calf raises and toe raises holding onto cabinet   08/12/22 Nustep level 5 x 7 minutes 20# HS curls 2.5# LAQ Red tband hip abduction Red tband  rows 2.5# marches Pball roll outs Shrugs Seated toe raises and heel raises STM tot he low back and into the rhomboids   08/08/22 Nustep level 5 x 7 minutes Shrugs Shoulder rolls Pball roll outs Red tband HS curls Seated marches and hip abduction Toe raise and heel raise in sitting STM to the calves the right low back  08/06/22 Nustep level 4 x 7 minutes Leg curls 20# 2x10 LAQ 2# Marches 2# Seated hip abduction 2# Seated red  tband rows and extensions STM to the calves and feet, PROM of the toes and ankles STM to the low back  PATIENT EDUCATION:  Education details: POC Person educated: Patient Education method: Explanation Education comprehension: verbalized understanding   HOME EXERCISE PROGRAM: TBD  ASSESSMENT:  CLINICAL IMPRESSION: Patient continues to c/o stiffness and weakness especially with her not being active and the cold weather.  She is very slow with her gait without the [facility]  OBJECTIVE IMPAIRMENTS: Abnormal gait, cardiopulmonary status limiting activity, decreased activity tolerance, decreased balance, decreased endurance, decreased mobility, difficulty walking, decreased ROM, decreased strength, increased muscle spasms, impaired flexibility, postural dysfunction, and pain.    REHAB POTENTIAL: Good  CLINICAL DECISION MAKING: Stable/uncomplicated  EVALUATION COMPLEXITY: Low   GOALS: Goals reviewed with patient? Yes  SHORT TERM GOALS: Target date: 06/24/22  Independent with initial HEP Goal status: met  LONG TERM GOALS: Target date: 09/02/22  Decrease pain 50% Goal status: progressing  2.  Independent with an advanced HEP  Goal status:ongoing  3.  Decrease TUG time to 13 seconds Goal status: progressing  4.  Increase LE strength to 4/5 Goal status: progressing  5.  Walk 10 minutes with SPC and without rest Goal status:  progressing  PLAN: PT FREQUENCY: 1-2x/week  PT DURATION: 12 weeks  PLANNED INTERVENTIONS: Therapeutic  exercises, Therapeutic activity, Neuromuscular re-education, Balance training, Gait training, Patient/Family education, Self Care, Joint mobilization, Stair training, Dry Needling, Electrical stimulation, Moist heat, and Manual therapy.  PLAN FOR NEXT SESSION: work to help with pain and increase overall function Jearld Lesch, PT 08/27/2022, 12:53 PM

## 2022-08-28 ENCOUNTER — Other Ambulatory Visit: Payer: Self-pay | Admitting: Internal Medicine

## 2022-08-29 ENCOUNTER — Ambulatory Visit: Payer: Medicare Other | Admitting: Physical Therapy

## 2022-08-29 ENCOUNTER — Encounter: Payer: Self-pay | Admitting: Physical Therapy

## 2022-08-29 DIAGNOSIS — M542 Cervicalgia: Secondary | ICD-10-CM | POA: Diagnosis not present

## 2022-08-29 DIAGNOSIS — R262 Difficulty in walking, not elsewhere classified: Secondary | ICD-10-CM | POA: Diagnosis not present

## 2022-08-29 DIAGNOSIS — M5459 Other low back pain: Secondary | ICD-10-CM

## 2022-08-29 DIAGNOSIS — M62830 Muscle spasm of back: Secondary | ICD-10-CM | POA: Diagnosis not present

## 2022-08-29 NOTE — Therapy (Signed)
OUTPATIENT PHYSICAL THERAPY THORACOLUMBAR TREATMENT    Patient Name: Linda Mcgrath MRN: [id number] DOB:[birth year], 89 y.o., female Today's Date: 08/29/2022   PT End of Session - 08/29/22 1250     Visit Number 19    Date for PT Re-Evaluation 09/10/22    Authorization Type [employer]    PT Start Time 1250    PT Stop Time 1335    PT Time Calculation (min) 45 min    Activity Tolerance Patient tolerated treatment well    Behavior During Therapy [facility] for tasks assessed/performed             Past Medical History:  Diagnosis Date   Allergic rhinitis    Arthritis    Asthma    Cataract    Bil/lens implant   Colon polyp 1999   small polyp   Diverticulosis    Dysrhythmia    Fibromyalgia    Hyperlipidemia    Hypothyroidism    Internal hemorrhoids    Leg cramps    Lichen planus    Lung nodule    stable LUL 9 mm   Menopausal syndrome    Osteoarthritis    UTI (urinary tract infection)    Past Surgical History:  Procedure Laterality Date   Abd U/S  11/1998   negative   Abd U/S  01/2001   gallbladder polyps   ABDOMINAL HYSTERECTOMY  1975   total-fibroids   APPENDECTOMY  1975   BREAST BIOPSY     benign   BREAST EXCISIONAL BIOPSY Left 1957   CARDIOVERSION N/A 03/07/2016   Procedure: CARDIOVERSION;  Surgeon: Yates Decamp, MD;  Location: [facility] ENDOSCOPY;  Service: Cardiovascular;  Laterality: N/A;   CATARACT EXTRACTION     bilateral   CHOLECYSTECTOMY     COLONOSCOPY  1998   Diverticulosis; polyp\   COLONOSCOPY  09/2002   Diverticulosis, hem   COLONOSCOPY  12/2007   diverticulosis, polyp   DEXA  10/2001   osteopenia   ELECTROPHYSIOLOGIC STUDY N/A 04/03/2016   Procedure: A-Flutter Ablation;  Surgeon: Will [name], MD;  Location: [facility] INVASIVE CV LAB;  Service: Cardiovascular;  Laterality: N/A;   ESOPHAGOGASTRODUODENOSCOPY  2004   Hida scan  01/2001   Negative   KNEE SURGERY     right knee / 11/2004   LAMINECTOMY WITH POSTERIOR LATERAL ARTHRODESIS LEVEL 2  N/A 07/31/2017   Procedure: DECOMPRESSIVE LAMINECTOMY LUMABR FOUR-FIVE, LUMBAR FIVE-SACRAL ONE;  Surgeon: Tia Alert, MD;  Location: [facility] OR;  Service: Neurosurgery;  Laterality: N/A;   laser surgery for glaucoma Left 09/21/2014   MULTIPLE TOOTH EXTRACTIONS     rentinal tear     ROTATOR CUFF REPAIR     x 2 /right shoulder   SKIN CANCER EXCISION     pre-melanoma / on face   TEE WITHOUT CARDIOVERSION N/A 03/07/2016   Procedure: TRANSESOPHAGEAL ECHOCARDIOGRAM (TEE);  Surgeon: Yates Decamp, MD;  Location: [facility] ENDOSCOPY;  Service: Cardiovascular;  Laterality: N/A;   TOE SURGERY     rt foot    Patient Active Problem List   Diagnosis Date Noted   Acute cystitis without hematuria 03/26/2022   Vaginal itching 03/26/2022   Urinary frequency 03/26/2022   Vaginal candida 03/26/2022   Chronic upper back pain 08/30/2020   Aortic atherosclerosis (HCC) 05/31/2020   Abnormal CT scan, small bowel 05/18/2020   Insomnia 12/15/2019   Chronic low back pain 10/18/2019   Osteoarthritis of right knee 08/02/2019   S/P lumbar spinal fusion 07/31/2017   H/O atrial flutter 03/06/2016  Obesity 11/06/2015   Urge incontinence 01/24/2015   Estrogen deficiency 10/25/2014   Lichen planus 12/20/2013   Prediabetes 06/03/2011   HYPERTENSION, BENIGN ESSENTIAL 10/23/2010   OSTEOARTHRITIS, HANDS, BILATERAL 01/24/2010   Hypothyroidism 08/24/2008   Vitamin D deficiency 06/06/2008   Hyperlipidemia 06/06/2008   MENOPAUSAL SYNDROME 03/29/2008   Osteoporosis 03/29/2008   Allergic rhinitis 01/25/2008   IBS 01/25/2008   OSTEOARTHRITIS 01/25/2008   FIBROMYALGIA 01/25/2008    PCP: [employer]  REFERRING PROVIDER: [name]  REFERRING DIAG: Back pain, difficulty walking, osteoporosis  Rationale for Evaluation and Treatment Rehabilitation  THERAPY DIAG:  Other low back pain  Muscle spasm of back  Cervicalgia  Difficulty in walking, not elsewhere classified  ONSET DATE: September 2023  SUBJECTIVE:                                                                                                                                                                                            SUBJECTIVE STATEMENT: Patient continues to report stiff knees and back, had trouble getting up from sitting in the lobby PERTINENT HISTORY:  See above  PAIN:  Are you having pain? Yes: NPRS scale: 6/10 Pain location: low back, right knee Pain description: ache, sore Aggravating factors: yardwork, hosuework pain up to 8/10 Relieving factors: rest, pain can be 0/10 at times   PRECAUTIONS: None  WEIGHT BEARING RESTRICTIONS: No  FALLS:  Has patient fallen in last 6 months? No  LIVING ENVIRONMENT: Lives with: lives with their family and lives alone Lives in: House/apartment Stairs: No Has following equipment at home: Single point cane  OCCUPATION: retired  PLOF: Independent able to do yardwork and housework  PATIENT GOALS: walk better, less pain   OBJECTIVE:   DIAGNOSTIC FINDINGS:  osteoporosis  PATIENT SURVEYS:  FOTO 31  COGNITION:  Overall cognitive status: Within functional limits for tasks assessed     SENSATION: WFL  MUSCLE LENGTH: Tight calves, HS and piriformis  POSTURE: rounded shoulders, forward head, and decreased lumbar lordosis  PALPATION: She is tight and tender in the lumbar area and into the thoracic and upper trap area, mild tenderness in the buttocks  LUMBAR ROM:   AROM eval  Flexion Decreased 50%  Extension Decreased 100% with pain  Right lateral flexion   Left lateral flexion   Right rotation   Left rotation    (Blank rows = not tested)  LOWER EXTREMITY ROM:     Active  Right eval Left eval  Hip flexion    Hip extension    Hip abduction    Hip adduction    Hip internal rotation    Hip external rotation  Knee flexion    Knee extension    Ankle dorsiflexion    Ankle plantarflexion    Ankle inversion    Ankle eversion     (Blank rows = not tested)  LOWER  EXTREMITY MMT:    MMT Right eval Left eval  Hip flexion 3+ 3+  Hip extension    Hip abduction 3+ 3+  Hip adduction    Hip internal rotation    Hip external rotation    Knee flexion 3+ with pain 3+  Knee extension 3+ with pain 3+  Ankle dorsiflexion 4- 4-  Ankle plantarflexion    Ankle inversion    Ankle eversion     (Blank rows = not tested)  FUNCTIONAL TESTS:  5 times sit to stand: 31 seconds Timed up and go (TUG): 26 seconds with [facility]   07/25/22 TUG 22 seconds with SPC, 08/20/22 18 seconds with SPC  GAIT: Distance walked: 60 feet Assistive device utilized: Single point cane Level of assistance: SBA Comments: slow, slightly stooped    TODAY'S TREATMENT:  [facility] Adult PT Treatment:                                                                                                                             08/29/22 Nustep level 5 x 7 minutes Standing 5# straight arm pulls Pball roll outs Pball isometric abs Red tband AR press in sitting 2.5# LAQ 2.5# Hip abduction 2.5# marching Heel raises Toe raises Calf stretches Gait without cane 200 feet SBA  08/27/22 P ball roll outs 2.5# LAQ 2.5# marches 2.5# hip abduction Pball in lap isometric abs Nustep level 5 x 6 minutes Leg curls 20# 2x10 No device 200 feet SBA walking slow pace and then up and over on the steps  08/22/22 Nustep level 5 x 7 minutes Leg curls 15# REd tband shoulder extension and rows Pelvic mobility and clocks 2.5# LAQ 2.5# marches P ball roll outs 3 ways Pball isometric abs Standing toe raise and calf raise Walking without device 220 feet x 2 SBA, slow pace   08/20/22 Nustep level 5 x 7 minutes Leg curls 15# 2x15 TUG 18 seconds with SPC LAQ 2.5# 2x10 Marches 2.5# 2x10 Seated hip abduction 2.5# Red tband rows and extension Toe and heel raises Passive stretch of the HS and calves   08/15/22 Nustep Level 4 x 7 minutes Red tband HS curls 2.5# LAQ 2.5# marches 2.5# hip  abduction Passive HS and calf stretches Pball roll outs Pball isometric abs Yellow tband ER/IR shoulders Standing calf raises and toe raises holding onto cabinet   08/12/22 Nustep level 5 x 7 minutes 20# HS curls 2.5# LAQ Red tband hip abduction Red tband rows 2.5# marches Pball roll outs Shrugs Seated toe raises and heel raises STM tot he low back and into the rhomboids   PATIENT EDUCATION:  Education details: POC Person educated: Patient Education method: Explanation Education comprehension: verbalized understanding   HOME EXERCISE PROGRAM:  TBD  ASSESSMENT:  CLINICAL IMPRESSION: Patient continues to c/o stiffness .  She did have more difficulty getting up from sitting today and c/o stiff and weak in the knees, I added some core work today to see if this will help her overall strength  OBJECTIVE IMPAIRMENTS: Abnormal gait, cardiopulmonary status limiting activity, decreased activity tolerance, decreased balance, decreased endurance, decreased mobility, difficulty walking, decreased ROM, decreased strength, increased muscle spasms, impaired flexibility, postural dysfunction, and pain.    REHAB POTENTIAL: Good  CLINICAL DECISION MAKING: Stable/uncomplicated  EVALUATION COMPLEXITY: Low   GOALS: Goals reviewed with patient? Yes  SHORT TERM GOALS: Target date: 06/24/22  Independent with initial HEP Goal status: met  LONG TERM GOALS: Target date: 09/02/22  Decrease pain 50% Goal status: progressing  2.  Independent with an advanced HEP  Goal status:ongoing  3.  Decrease TUG time to 13 seconds Goal status: progressing  4.  Increase LE strength to 4/5 Goal status: progressing  5.  Walk 10 minutes with SPC and without rest Goal status:  progressing  PLAN: PT FREQUENCY: 1-2x/week  PT DURATION: 12 weeks  PLANNED INTERVENTIONS: Therapeutic exercises, Therapeutic activity, Neuromuscular re-education, Balance training, Gait training, Patient/Family  education, Self Care, Joint mobilization, Stair training, Dry Needling, Electrical stimulation, Moist heat, and Manual therapy.  PLAN FOR NEXT SESSION: work to help with pain and increase overall function Melbert Botelho W, PT 08/29/2022, 12:51 PM

## 2022-09-03 ENCOUNTER — Ambulatory Visit: Payer: Medicare Other | Admitting: Physical Therapy

## 2022-09-05 ENCOUNTER — Ambulatory Visit: Payer: Medicare Other | Admitting: Physical Therapy

## 2022-09-05 ENCOUNTER — Encounter: Payer: Self-pay | Admitting: Physical Therapy

## 2022-09-05 DIAGNOSIS — M62830 Muscle spasm of back: Secondary | ICD-10-CM | POA: Diagnosis not present

## 2022-09-05 DIAGNOSIS — M542 Cervicalgia: Secondary | ICD-10-CM

## 2022-09-05 DIAGNOSIS — R262 Difficulty in walking, not elsewhere classified: Secondary | ICD-10-CM | POA: Diagnosis not present

## 2022-09-05 DIAGNOSIS — M5459 Other low back pain: Secondary | ICD-10-CM | POA: Diagnosis not present

## 2022-09-05 NOTE — Therapy (Signed)
OUTPATIENT PHYSICAL THERAPY THORACOLUMBAR TREATMENT  Progress Note Reporting Period 08/01/22 to 09/05/22 for visits 11-20  See note below for Objective Data and Assessment of Progress/Goals.      Patient Name: Linda Mcgrath MRN: [id number] DOB:[birth year], 89 y.o., female Today's Date: 09/05/2022   PT End of Session - 09/05/22 1254     Visit Number 20    Date for PT Re-Evaluation 09/10/22    Authorization Type [employer]    PT Start Time 1252    PT Stop Time 1346    PT Time Calculation (min) 54 min    Activity Tolerance Patient tolerated treatment well    Behavior During Therapy [facility] for tasks assessed/performed             Past Medical History:  Diagnosis Date   Allergic rhinitis    Arthritis    Asthma    Cataract    Bil/lens implant   Colon polyp 1999   small polyp   Diverticulosis    Dysrhythmia    Fibromyalgia    Hyperlipidemia    Hypothyroidism    Internal hemorrhoids    Leg cramps    Lichen planus    Lung nodule    stable LUL 9 mm   Menopausal syndrome    Osteoarthritis    UTI (urinary tract infection)    Past Surgical History:  Procedure Laterality Date   Abd U/S  11/1998   negative   Abd U/S  01/2001   gallbladder polyps   ABDOMINAL HYSTERECTOMY  1975   total-fibroids   APPENDECTOMY  1975   BREAST BIOPSY     benign   BREAST EXCISIONAL BIOPSY Left 1957   CARDIOVERSION N/A 03/07/2016   Procedure: CARDIOVERSION;  Surgeon: Yates Decamp, MD;  Location: [facility] ENDOSCOPY;  Service: Cardiovascular;  Laterality: N/A;   CATARACT EXTRACTION     bilateral   CHOLECYSTECTOMY     COLONOSCOPY  1998   Diverticulosis; polyp\   COLONOSCOPY  09/2002   Diverticulosis, hem   COLONOSCOPY  12/2007   diverticulosis, polyp   DEXA  10/2001   osteopenia   ELECTROPHYSIOLOGIC STUDY N/A 04/03/2016   Procedure: A-Flutter Ablation;  Surgeon: Will [name], MD;  Location: [facility] INVASIVE CV LAB;  Service: Cardiovascular;  Laterality: N/A;    ESOPHAGOGASTRODUODENOSCOPY  2004   Hida scan  01/2001   Negative   KNEE SURGERY     right knee / 11/2004   LAMINECTOMY WITH POSTERIOR LATERAL ARTHRODESIS LEVEL 2 N/A 07/31/2017   Procedure: DECOMPRESSIVE LAMINECTOMY LUMABR FOUR-FIVE, LUMBAR FIVE-SACRAL ONE;  Surgeon: Tia Alert, MD;  Location: [facility] OR;  Service: Neurosurgery;  Laterality: N/A;   laser surgery for glaucoma Left 09/21/2014   MULTIPLE TOOTH EXTRACTIONS     rentinal tear     ROTATOR CUFF REPAIR     x 2 /right shoulder   SKIN CANCER EXCISION     pre-melanoma / on face   TEE WITHOUT CARDIOVERSION N/A 03/07/2016   Procedure: TRANSESOPHAGEAL ECHOCARDIOGRAM (TEE);  Surgeon: Yates Decamp, MD;  Location: [facility] ENDOSCOPY;  Service: Cardiovascular;  Laterality: N/A;   TOE SURGERY     rt foot    Patient Active Problem List   Diagnosis Date Noted   Acute cystitis without hematuria 03/26/2022   Vaginal itching 03/26/2022   Urinary frequency 03/26/2022   Vaginal candida 03/26/2022   Chronic upper back pain 08/30/2020   Aortic atherosclerosis (HCC) 05/31/2020   Abnormal CT scan, small bowel 05/18/2020   Insomnia 12/15/2019   Chronic low  back pain 10/18/2019   Osteoarthritis of right knee 08/02/2019   S/P lumbar spinal fusion 07/31/2017   H/O atrial flutter 03/06/2016   Obesity 11/06/2015   Urge incontinence 01/24/2015   Estrogen deficiency 10/25/2014   Lichen planus 12/20/2013   Prediabetes 06/03/2011   HYPERTENSION, BENIGN ESSENTIAL 10/23/2010   OSTEOARTHRITIS, HANDS, BILATERAL 01/24/2010   Hypothyroidism 08/24/2008   Vitamin D deficiency 06/06/2008   Hyperlipidemia 06/06/2008   MENOPAUSAL SYNDROME 03/29/2008   Osteoporosis 03/29/2008   Allergic rhinitis 01/25/2008   IBS 01/25/2008   OSTEOARTHRITIS 01/25/2008   FIBROMYALGIA 01/25/2008    PCP: [employer]  REFERRING PROVIDER: [name]  REFERRING DIAG: Back pain, difficulty walking, osteoporosis  Rationale for Evaluation and Treatment Rehabilitation  THERAPY DIAG:  Other  low back pain  Muscle spasm of back  Cervicalgia  Difficulty in walking, not elsewhere classified  ONSET DATE: September 2023  SUBJECTIVE:                                                                                                                                                                                           SUBJECTIVE STATEMENT: Patient reports that on Saturday it was raining and she had some rain boots on, she says she does not wear them and she turned and caught her feet up on each other and fell, she reports that she hit her back and has been very sore, she reports that she was on the floor struggling to try to get to her phone for about an hour, she had to call someone and they lifted her up  PERTINENT HISTORY:  See above  PAIN:  Are you having pain? Yes: NPRS scale: 8/10 Pain location: low back, right knee Pain description: ache, sore Aggravating factors: yardwork, hosuework pain up to 8/10 Relieving factors: rest, pain can be 0/10 at times   PRECAUTIONS: None  WEIGHT BEARING RESTRICTIONS: No  FALLS:  Has patient fallen in last 6 months? No  LIVING ENVIRONMENT: Lives with: lives with their family and lives alone Lives in: House/apartment Stairs: No Has following equipment at home: Single point cane  OCCUPATION: retired  PLOF: Independent able to do yardwork and housework  PATIENT GOALS: walk better, less pain   OBJECTIVE:   DIAGNOSTIC FINDINGS:  osteoporosis  PATIENT SURVEYS:  FOTO 31  COGNITION:  Overall cognitive status: Within functional limits for tasks assessed     SENSATION: WFL  MUSCLE LENGTH: Tight calves, HS and piriformis  POSTURE: rounded shoulders, forward head, and decreased lumbar lordosis  PALPATION: She is tight and tender in the lumbar area and into the thoracic and upper trap area, mild tenderness in the buttocks  LUMBAR ROM:   AROM eval  Flexion Decreased 50%  Extension Decreased 100% with pain  Right  lateral flexion   Left lateral flexion   Right rotation   Left rotation    (Blank rows = not tested)  LOWER EXTREMITY ROM:     Active  Right eval Left eval  Hip flexion    Hip extension    Hip abduction    Hip adduction    Hip internal rotation    Hip external rotation    Knee flexion    Knee extension    Ankle dorsiflexion    Ankle plantarflexion    Ankle inversion    Ankle eversion     (Blank rows = not tested)  LOWER EXTREMITY MMT:    MMT Right eval Left eval  Hip flexion 3+ 3+  Hip extension    Hip abduction 3+ 3+  Hip adduction    Hip internal rotation    Hip external rotation    Knee flexion 3+ with pain 3+  Knee extension 3+ with pain 3+  Ankle dorsiflexion 4- 4-  Ankle plantarflexion    Ankle inversion    Ankle eversion     (Blank rows = not tested)  FUNCTIONAL TESTS:  5 times sit to stand: 31 seconds Timed up and go (TUG): 26 seconds with [facility]   07/25/22 TUG 22 seconds with SPC, 08/20/22 18 seconds with SPC  GAIT: Distance walked: 60 feet Assistive device utilized: Single point cane Level of assistance: SBA Comments: slow, slightly stooped    TODAY'S TREATMENT:  [facility] Adult PT Treatment:                                                                                                                             09/05/22 Nustep level 4 x 7 minutes Seated LAQ MArches Pball rollouts 3 ways Side bend elbow touches Toe taps/heel raises STM to the back and upper traps, no bruising but tender has a spot on the elbow with a band aid on reports that it is a blister from her trying to army crawl to the couch MHP/IFC in sitting to the low back   08/29/22 Nustep level 5 x 7 minutes Standing 5# straight arm pulls Pball roll outs Pball isometric abs Red tband AR press in sitting 2.5# LAQ 2.5# Hip abduction 2.5# marching Heel raises Toe raises Calf stretches Gait without cane 200 feet SBA  08/27/22 P ball roll outs 2.5# LAQ 2.5# marches 2.5# hip  abduction Pball in lap isometric abs Nustep level 5 x 6 minutes Leg curls 20# 2x10 No device 200 feet SBA walking slow pace and then up and over on the steps  08/22/22 Nustep level 5 x 7 minutes Leg curls 15# REd tband shoulder extension and rows Pelvic mobility and clocks 2.5# LAQ 2.5# marches P ball roll outs 3 ways Pball isometric abs Standing toe raise and calf raise Walking without device 220 feet x 2 SBA,  slow pace   08/20/22 Nustep level 5 x 7 minutes Leg curls 15# 2x15 TUG 18 seconds with SPC LAQ 2.5# 2x10 Marches 2.5# 2x10 Seated hip abduction 2.5# Red tband rows and extension Toe and heel raises Passive stretch of the HS and calves   08/15/22 Nustep Level 4 x 7 minutes Red tband HS curls 2.5# LAQ 2.5# marches 2.5# hip abduction Passive HS and calf stretches Pball roll outs Pball isometric abs Yellow tband ER/IR shoulders Standing calf raises and toe raises holding onto cabinet   08/12/22 Nustep level 5 x 7 minutes 20# HS curls 2.5# LAQ Red tband hip abduction Red tband rows 2.5# marches Pball roll outs Shrugs Seated toe raises and heel raises STM tot he low back and into the rhomboids   PATIENT EDUCATION:  Education details: POC Person educated: Patient Education method: Explanation Education comprehension: verbalized understanding   HOME EXERCISE PROGRAM: TBD  ASSESSMENT:  CLINICAL IMPRESSION: Patient had a fall last week, reports that it was a rainy day and she put on some new rainboots that she is not used to [employer], reports that she did a quick turn and got her feet tangled and fell on her back, she was not able to get up on her own, reports on the floor for about an hour before she finally got to her phone, she is sore in the buttocks, low back and into the upper back and neck.  We talked about the need for a life alert type system, she reports that her son is ordering one for her.  She is moving much slower today and did ask for  some CGA with walking while using the [facility].  OBJECTIVE IMPAIRMENTS: Abnormal gait, cardiopulmonary status limiting activity, decreased activity tolerance, decreased balance, decreased endurance, decreased mobility, difficulty walking, decreased ROM, decreased strength, increased muscle spasms, impaired flexibility, postural dysfunction, and pain.    REHAB POTENTIAL: Good  CLINICAL DECISION MAKING: Stable/uncomplicated  EVALUATION COMPLEXITY: Low   GOALS: Goals reviewed with patient? Yes  SHORT TERM GOALS: Target date: 06/24/22  Independent with initial HEP Goal status: met  LONG TERM GOALS: Target date: 09/02/22  Decrease pain 50% Goal status: progressing  2.  Independent with an advanced HEP  Goal status:ongoing  3.  Decrease TUG time to 13 seconds Goal status: progressing  4.  Increase LE strength to 4/5 Goal status: progressing  5.  Walk 10 minutes with SPC and without rest Goal status:  progressing  PLAN: PT FREQUENCY: 1-2x/week  PT DURATION: 12 weeks  PLANNED INTERVENTIONS: Therapeutic exercises, Therapeutic activity, Neuromuscular re-education, Balance training, Gait training, Patient/Family education, Self Care, Joint mobilization, Stair training, Dry Needling, Electrical stimulation, Moist heat, and Manual therapy.  PLAN FOR NEXT SESSION: see if we can get her feeling better and moving again. Jearld Lesch, PT 09/05/2022, 1:35 PM

## 2022-09-11 ENCOUNTER — Other Ambulatory Visit: Payer: Self-pay | Admitting: Internal Medicine

## 2022-09-14 ENCOUNTER — Other Ambulatory Visit: Payer: Self-pay | Admitting: Internal Medicine

## 2022-09-16 ENCOUNTER — Telehealth: Payer: Self-pay | Admitting: Internal Medicine

## 2022-09-16 NOTE — Telephone Encounter (Signed)
Patient is calling to get refill on cholestyramine. She needs an OV to get refill. She scheduled for 2/8 but wants to know can she still get refill. Please advise.

## 2022-09-17 MED ORDER — CHOLESTYRAMINE 4 G PO PACK: PACK | ORAL | 0 refills | Status: DC

## 2022-09-17 NOTE — Telephone Encounter (Signed)
Questran refilled

## 2022-09-25 DIAGNOSIS — H401121 Primary open-angle glaucoma, left eye, mild stage: Secondary | ICD-10-CM | POA: Diagnosis not present

## 2022-09-26 ENCOUNTER — Ambulatory Visit: Payer: Medicare Other | Attending: Family Medicine | Admitting: Physical Therapy

## 2022-09-26 DIAGNOSIS — R262 Difficulty in walking, not elsewhere classified: Secondary | ICD-10-CM | POA: Insufficient documentation

## 2022-09-26 DIAGNOSIS — M542 Cervicalgia: Secondary | ICD-10-CM | POA: Insufficient documentation

## 2022-09-26 DIAGNOSIS — M5459 Other low back pain: Secondary | ICD-10-CM | POA: Insufficient documentation

## 2022-09-26 DIAGNOSIS — M62830 Muscle spasm of back: Secondary | ICD-10-CM | POA: Insufficient documentation

## 2022-09-30 ENCOUNTER — Ambulatory Visit (INDEPENDENT_AMBULATORY_CARE_PROVIDER_SITE_OTHER): Payer: Medicare Other

## 2022-09-30 VITALS — Ht 62.5 in | Wt 165.0 lb

## 2022-09-30 DIAGNOSIS — Z0000 Encounter for general adult medical examination without abnormal findings: Secondary | ICD-10-CM | POA: Diagnosis not present

## 2022-09-30 NOTE — Patient Instructions (Addendum)
Linda Mcgrath , Thank you for taking time to come for your Medicare Wellness Visit. I appreciate your ongoing commitment to your health goals. Please review the following plan we discussed and let me know if I can assist you in the future.   These are the goals we discussed:  Goals Addressed             This Visit's Progress    Patient Stated       Would like to maintain current routine Stay active Healthy diet         This is a list of the screening recommended for you and due dates:  Health Maintenance  Topic Date Due   COVID-19 Vaccine (5 - 2023-24 season) 10/16/2022*   Mammogram  05/07/2023   Medicare Annual Wellness Visit  10/01/2023   DTaP/Tdap/Td vaccine (3 - Td or Tdap) 11/06/2024   Pneumonia Vaccine  Completed   Flu Shot  Completed   DEXA scan (bone density measurement)  Completed   Zoster (Shingles) Vaccine  Completed   HPV Vaccine  Aged Out  *Topic was postponed. The date shown is not the original due date.    Advanced directives: End of life planning; Advance aging; Advanced directives discussed.  Copy of current HCPOA/Living Will requested.    Conditions/risks identified: none new.  Next appointment: Follow up in one year for your annual wellness visit    Preventive Care 65 Years and Older, Female Preventive care refers to lifestyle choices and visits with your health care provider that can promote health and wellness. What does preventive care include? A yearly physical exam. This is also called an annual well check. Dental exams once or twice a year. Routine eye exams. Ask your health care provider how often you should have your eyes checked. Personal lifestyle choices, including: Daily care of your teeth and gums. Regular physical activity. Eating a healthy diet. Avoiding tobacco and drug use. Limiting alcohol use. Practicing safe sex. Taking low-dose aspirin every day. Taking vitamin and mineral supplements as recommended by your health  care provider. What happens during an annual well check? The services and screenings done by your health care provider during your annual well check will depend on your age, overall health, lifestyle risk factors, and family history of disease. Counseling  Your health care provider may ask you questions about your: Alcohol use. Tobacco use. Drug use. Emotional well-being. Home and relationship well-being. Sexual activity. Eating habits. History of falls. Memory and ability to understand (cognition). Work and work Statistician. Reproductive health. Screening  You may have the following tests or measurements: Height, weight, and BMI. Blood pressure. Lipid and cholesterol levels. These may be checked every 5 years, or more frequently if you are over 27 years old. Skin check. Lung cancer screening. You may have this screening every year starting at age 59 if you have a 30-pack-year history of smoking and currently smoke or have quit within the past 15 years. Fecal occult blood test (FOBT) of the stool. You may have this test every year starting at age 27. Flexible sigmoidoscopy or colonoscopy. You may have a sigmoidoscopy every 5 years or a colonoscopy every 10 years starting at age 74. Hepatitis C blood test. Hepatitis B blood test. Sexually transmitted disease (STD) testing. Diabetes screening. This is done by checking your blood sugar (glucose) after you have not eaten for a while (fasting). You may have this done every 1-3 years. Bone density scan. This is done to screen for osteoporosis. You  may have this done starting at age 25. Mammogram. This may be done every 1-2 years. Talk to your health care provider about how often you should have regular mammograms. Talk with your health care provider about your test results, treatment options, and if necessary, the need for more tests. Vaccines  Your health care provider may recommend certain vaccines, such as: Influenza vaccine. This is  recommended every year. Tetanus, diphtheria, and acellular pertussis (Tdap, Td) vaccine. You may need a Td booster every 10 years. Zoster vaccine. You may need this after age 67. Pneumococcal 13-valent conjugate (PCV13) vaccine. One dose is recommended after age 37. Pneumococcal polysaccharide (PPSV23) vaccine. One dose is recommended after age 1. Talk to your health care provider about which screenings and vaccines you need and how often you need them. This information is not intended to replace advice given to you by your health care provider. Make sure you discuss any questions you have with your health care provider. Document Released: 09/08/2015 Document Revised: 05/01/2016 Document Reviewed: 06/13/2015 Elsevier Interactive Patient Education  2017 [employer] Prevention in the Home Falls can cause injuries. They can happen to people of all ages. There are many things you can do to make your home safe and to help prevent falls. What can I do on the outside of my home? Regularly fix the edges of walkways and driveways and fix any cracks. Remove anything that might make you trip as you walk through a door, such as a raised step or threshold. Trim any bushes or trees on the path to your home. Use bright outdoor lighting. Clear any walking paths of anything that might make someone trip, such as rocks or tools. Regularly check to see if handrails are loose or broken. Make sure that both sides of any steps have handrails. Any raised decks and porches should have guardrails on the edges. Have any leaves, snow, or ice cleared regularly. Use sand or salt on walking paths during winter. Clean up any spills in your garage right away. This includes oil or grease spills. What can I do in the bathroom? Use night lights. Install grab bars by the toilet and in the tub and shower. Do not use towel bars as grab bars. Use non-skid mats or decals in the tub or shower. If you need to sit down in  the shower, use a plastic, non-slip stool. Keep the floor dry. Clean up any water that spills on the floor as soon as it happens. Remove soap buildup in the tub or shower regularly. Attach bath mats securely with double-sided non-slip rug tape. Do not have throw rugs and other things on the floor that can make you trip. What can I do in the bedroom? Use night lights. Make sure that you have a light by your bed that is easy to reach. Do not use any sheets or blankets that are too big for your bed. They should not hang down onto the floor. Have a firm chair that has side arms. You can use this for support while you get dressed. Do not have throw rugs and other things on the floor that can make you trip. What can I do in the kitchen? Clean up any spills right away. Avoid walking on wet floors. Keep items that you use a lot in easy-to-reach places. If you need to reach something above you, use a strong step stool that has a grab bar. Keep electrical cords out of the way. Do not use floor  polish or wax that makes floors slippery. If you must use wax, use non-skid floor wax. Do not have throw rugs and other things on the floor that can make you trip. What can I do with my stairs? Do not leave any items on the stairs. Make sure that there are handrails on both sides of the stairs and use them. Fix handrails that are broken or loose. Make sure that handrails are as long as the stairways. Check any carpeting to make sure that it is firmly attached to the stairs. Fix any carpet that is loose or worn. Avoid having throw rugs at the top or bottom of the stairs. If you do have throw rugs, attach them to the floor with carpet tape. Make sure that you have a light switch at the top of the stairs and the bottom of the stairs. If you do not have them, ask someone to add them for you. What else can I do to help prevent falls? Wear shoes that: Do not have high heels. Have rubber bottoms. Are comfortable  and fit you well. Are closed at the toe. Do not wear sandals. If you use a stepladder: Make sure that it is fully opened. Do not climb a closed stepladder. Make sure that both sides of the stepladder are locked into place. Ask someone to hold it for you, if possible. Clearly mark and make sure that you can see: Any grab bars or handrails. First and last steps. Where the edge of each step is. Use tools that help you move around (mobility aids) if they are needed. These include: Canes. Walkers. Scooters. Crutches. Turn on the lights when you go into a dark area. Replace any light bulbs as soon as they burn out. Set up your furniture so you have a clear path. Avoid moving your furniture around. If any of your floors are uneven, fix them. If there are any pets around you, be aware of where they are. Review your medicines with your doctor. Some medicines can make you feel dizzy. This can increase your chance of falling. Ask your doctor what other things that you can do to help prevent falls. This information is not intended to replace advice given to you by your health care provider. Make sure you discuss any questions you have with your health care provider. Document Released: 06/08/2009 Document Revised: 01/18/2016 Document Reviewed: 09/16/2014 Elsevier Interactive Patient Education  2017 [employer].

## 2022-09-30 NOTE — Progress Notes (Signed)
Subjective:   Linda Mcgrath is a 89 y.o. female who presents for Medicare Annual (Subsequent) preventive examination.  Review of Systems    No ROS.  Medicare Wellness Virtual Visit.  Visual/audio telehealth visit, UTA vital signs.   See social history for additional risk factors.   Cardiac Risk Factors include: advanced age (>84mn, >>34women)     Objective:    Today's Vitals   09/30/22 1130  Weight: 165 lb (74.8 kg)  Height: 5' 2.5" (1.588 m)   Body mass index is 29.7 kg/m.     09/30/2022   11:31 AM 06/10/2022   12:52 PM 09/27/2021   12:07 PM 05/17/2021    2:17 PM 09/26/2020   12:00 PM 10/20/2019    2:56 PM 04/14/2019    2:26 PM  Advanced Directives  Does Patient Have a Medical Advance Directive? Yes No Yes No Yes Yes Yes  Type of AParamedicof [place] will  [place]  [place] Healthcare Power of [place] Does patient want to make changes to medical advance directive? No - Patient declined  Yes (MAU/Ambulatory/Procedural Areas - Information given)      Copy of [place] Chart? No - copy requested    No - copy requested  No - copy requested  Would patient like information on creating a medical advance directive?  No - Patient declined  No - Patient declined       Current Medications (verified) Outpatient Encounter Medications as of 09/30/2022  Medication Sig   alendronate (FOSAMAX) 70 MG tablet Take 1 tablet (70 mg total) by mouth every 7 (seven) days. Take with a full glass of water on an empty stomach.   Ascorbic Acid (VITAMIN C PO) Take 500 mg by mouth daily.   B Complex Vitamins (VITAMIN B COMPLEX PO) Take 1 tablet by mouth daily.   B COMPLEX, FOLIC ACID, PO Take 6915mcg by mouth daily.   Biotin 10 MG TABS Take 1 tablet by mouth every morning.    Calcium Citrate-Vitamin D 250-200 MG-UNIT TABS Take 1 tablet by mouth  daily.   cholestyramine (QUESTRAN) 4 g packet MIX AND DRINK 1 PACKET BY MOUTH DAILY   Cranberry 180 MG CAPS Take 2 capsules by mouth daily.   dextromethorphan-guaiFENesin (MUCINEX DM) 30-600 MG 12hr tablet Take 1 tablet by mouth 2 (two) times daily.   Fexofenadine HCl (MUCINEX ALLERGY PO) Take 1 tablet by mouth daily as needed (Allergies).    fluconazole (DIFLUCAN) 150 MG tablet Take one pill by mouth today and repeat dose in 3 days   fluticasone (FLONASE) 50 MCG/ACT nasal spray Place 1 spray into both nostrils daily for 3 days.   folic acid (FOLVITE) 1 MG tablet Take 1 mg by mouth daily.   levothyroxine (SYNTHROID) 88 MCG tablet Take 1 tablet (88 mcg total) by mouth daily before breakfast.   loratadine (CLARITIN) 10 MG tablet Take 10 mg by mouth daily as needed for allergies.    MAGNESIUM-OXIDE PO Take 1 tablet by mouth daily.   Methenamine-Sodium Salicylate (AZO URINARY TRACT DEFENSE PO) Take 1 capsule by mouth daily.   Multiple Vitamin (MULTIVITAMIN) capsule Take 1 capsule by mouth daily.   Psyllium (METAMUCIL FIBER PO) Take by mouth at bedtime. 2 tablespoon   Pumpkin Seed-Soy Germ (AZO BLADDER CONTROL/GO-LESS PO) Take 1 tablet by mouth 2 (two) times a week.   solifenacin (VESICARE) 5 MG tablet  TAKE 1 TABLET(5 MG) BY MOUTH DAILY   TURMERIC PO Take 1 capsule by mouth 2 (two) times daily.    vitamin B-12 (CYANOCOBALAMIN) 500 MCG tablet Take 500 mcg by mouth daily.   Vitamin D, Cholecalciferol, 50 MCG (2000 UT) CAPS Take 2 tablets by mouth daily.   No facility-administered encounter medications on file as of 09/30/2022.    Allergies (verified) Shellfish allergy, Tetracycline, Amlodipine besylate, Ciprofloxacin, Codeine, Propoxyphene hcl, Sulfamethoxazole-trimethoprim, Tape, Xarelto [rivaroxaban], Amoxicillin, Other, and Penicillins   History: Past Medical History:  Diagnosis Date   Allergic rhinitis    Arthritis    Asthma    Cataract    Bil/lens implant   Colon polyp 1999   small  polyp   Diverticulosis    Dysrhythmia    Fibromyalgia    Hyperlipidemia    Hypothyroidism    Internal hemorrhoids    Leg cramps    Lichen planus    Lung nodule    stable LUL 9 mm   Menopausal syndrome    Osteoarthritis    UTI (urinary tract infection)    Past Surgical History:  Procedure Laterality Date   Abd U/S  11/1998   negative   Abd U/S  01/2001   gallbladder polyps   ABDOMINAL HYSTERECTOMY  1975   total-fibroids   APPENDECTOMY  1975   BREAST BIOPSY     benign   BREAST EXCISIONAL BIOPSY Left 1957   CARDIOVERSION N/A 03/07/2016   Procedure: CARDIOVERSION;  Surgeon: Adrian Prows, MD;  Location: [facility];  Service: Cardiovascular;  Laterality: N/A;   CATARACT EXTRACTION     bilateral   CHOLECYSTECTOMY     COLONOSCOPY  1998   Diverticulosis; polyp\   COLONOSCOPY  09/2002   Diverticulosis, hem   COLONOSCOPY  12/2007   diverticulosis, polyp   DEXA  10/2001   osteopenia   ELECTROPHYSIOLOGIC STUDY N/A 04/03/2016   Procedure: A-Flutter Ablation;  Surgeon: Will Meredith Leeds, MD;  Location: [facility];  Service: Cardiovascular;  Laterality: N/A;   ESOPHAGOGASTRODUODENOSCOPY  2004   [place] scan  01/2001   Negative   KNEE SURGERY     right knee / 11/2004   LAMINECTOMY WITH POSTERIOR LATERAL ARTHRODESIS LEVEL 2 N/A 07/31/2017   Procedure: DECOMPRESSIVE LAMINECTOMY LUMABR FOUR-FIVE, LUMBAR FIVE-SACRAL ONE;  Surgeon: Eustace Moore, MD;  Location: [facility];  Service: Neurosurgery;  Laterality: N/A;   laser surgery for glaucoma Left 09/21/2014   MULTIPLE TOOTH EXTRACTIONS     rentinal tear     ROTATOR CUFF REPAIR     x 2 /right shoulder   SKIN CANCER EXCISION     pre-melanoma / on face   TEE WITHOUT CARDIOVERSION N/A 03/07/2016   Procedure: TRANSESOPHAGEAL ECHOCARDIOGRAM (TEE);  Surgeon: Adrian Prows, MD;  Location: [facility];  Service: Cardiovascular;  Laterality: N/A;   TOE SURGERY     rt foot    Family History  Problem Relation Age of Onset   Parkinsonism  Father    Colon cancer Brother    Allergies Mother    Heart disease Mother    Kidney failure Son        congenital  (kidney transplant)    Heart failure Son        died suddenly of CHF    Social History   Socioeconomic History   Marital status: Widowed    Spouse name: Not on file   Number of children: 2   Years of education: Not on file   Highest  education level: Not on file  Occupational History   Occupation: TRAVEL AGENT    Employer: [employer]  Tobacco Use   Smoking status: Never   Smokeless tobacco: Never  Vaping Use   Vaping Use: Never used  Substance and Sexual Activity   Alcohol use: No    Alcohol/week: 0.0 standard drinks of alcohol    Comment: occasional-rare   Drug use: No   Sexual activity: Never  Other Topics Concern   Not on file  Social History Narrative   Non-smoker      occ alcohol      Married-husband does the cooking      Works outside the home   Social Determinants of Radio broadcast assistant Strain: [place]  (09/30/2022)   Overall Financial Resource Strain (CARDIA)    Difficulty of Paying Living Expenses: Not hard at all  Food Insecurity: No Food Insecurity (09/30/2022)   Hunger Vital Sign    Worried About Running Out of Food in the Last Year: Never true    Ran Out of Food in the Last Year: Never true  Transportation Needs: No Transportation Needs (09/30/2022)   PRAPARE - Hydrologist (Medical): No    Lack of Transportation (Non-Medical): No  Physical Activity: Insufficiently Active (09/30/2022)   Exercise Vital Sign    Days of Exercise per Week: 2 days    Minutes of Exercise per Session: 60 min  Stress: No Stress Concern Present (09/30/2022)   [place]    Feeling of Stress : Not at all  Social Connections: Socially Isolated (09/30/2022)   Social Connection and Isolation Panel [NHANES]    Frequency of Communication with Friends and Family: More  than three times a week    Frequency of Social Gatherings with Friends and Family: Three times a week    Attends Religious Services: Never    Active Member of Clubs or Organizations: No    Attends Archivist Meetings: Never    Marital Status: Widowed    Tobacco Counseling Counseling given: Not Answered   Clinical Intake:  Pre-visit preparation completed: Yes        Diabetes: No  How often do you need to have someone help you when you read instructions, pamphlets, or other written materials from your doctor or pharmacy?: 1 - Never    Interpreter Needed?: No      Activities of Daily Living    09/30/2022   11:40 AM  In your present state of health, do you have any difficulty performing the following activities:  Hearing? 0  Vision? 0  Difficulty concentrating or making decisions? 0  Walking or climbing stairs? 0  Dressing or bathing? 0  Doing errands, shopping? 0  Preparing Food and eating ? N  Using the Toilet? N  In the past six months, have you accidently leaked urine? Y  Comment Managed with daily brief/pad  Do you have problems with loss of bowel control? N  Managing your Medications? N  Managing your Finances? N  Housekeeping or managing your Housekeeping? N    Patient Care Team: [name], Wynelle Fanny, MD as PCP - General Rana Snare, MD (Inactive) as Consulting Physician (Urology) Sharol Roussel Elwyn Lade, MD as Referring Physician (Dermatology) Luberta Mutter, MD as Consulting Physician (Ophthalmology)  Indicate any recent Medical Services you may have received from other than [facility] providers in the past year (date may be approximate).  Assessment:   This is a routine wellness examination for Linda Mcgrath.  I connected with  Linda Mcgrath on 09/30/22 by a audio enabled telemedicine application and verified that I am speaking with the correct person using two identifiers.  Patient Location: Home  Provider Location: Office/Clinic  I  discussed the limitations of evaluation and management by telemedicine. The patient expressed understanding and agreed to proceed.   Hearing/Vision screen Hearing Screening - Comments:: Patient is able to hear conversational tones without difficulty.  No issues reported.   Vision Screening - Comments::  Last exam 2023, Dr. Celene Squibb  Dietary issues and exercise activities discussed: Current Exercise Habits: Structured exercise class, Type of exercise: walking (Physical therapy x2 days about an hour), Time (Minutes): 20, Frequency (Times/Week): 5, Weekly Exercise (Minutes/Week): 100, Intensity: Mild Regular diet    Goals Addressed             This Visit's Progress    Patient Stated       Would like to maintain current routine Stay active Healthy diet       Depression Screen    09/30/2022   11:37 AM 09/27/2021   12:13 PM 09/26/2020   12:03 PM 10/18/2019   10:34 AM 03/25/2019    9:26 AM 11/17/2017    3:48 PM 11/15/2016   11:41 AM  PHQ 2/9 Scores  PHQ - 2 Score 0 0 0 5 0 0 0  PHQ- 9 Score   0 13       Fall Risk    09/30/2022   11:40 AM 09/27/2021   12:10 PM 09/26/2020   12:03 PM 03/25/2019    9:26 AM 11/17/2017    3:48 PM  [place] in the past year? 0 1 0 0 No  Number falls in past yr: 0 0 0 0   Injury with Fall? 0 0 0 0   Risk for fall due to :  Other (Comment) Impaired balance/gait    Risk for fall due to: Comment  fell helping son     Follow up Falls evaluation completed;Falls prevention discussed Falls prevention discussed Falls evaluation completed;Falls prevention discussed Falls evaluation completed     FALL RISK PREVENTION PERTAINING TO THE HOME: Home free of loose throw rugs in walkways, pet beds, electrical cords, etc? Yes  Adequate lighting in your home to reduce risk of falls? Yes   ASSISTIVE DEVICES UTILIZED TO PREVENT FALLS: Life alert? Yes  Use of a cane, walker or w/c? Yes , cane as needed Grab bars in the bathroom? Yes  Shower chair or bench in  shower? Yes  Elevated toilet seat or a handicapped toilet? No   TIMED UP AND GO: Was the test performed? No .   Cognitive Function:     09/26/2020   12:07 PM 11/15/2016   11:41 AM 11/06/2015    8:39 AM  MMSE - Mini Mental State Exam  Orientation to time '5 5 5  '$ Orientation to Place '5 5 5  '$ Registration '3 3 3  '$ Attention/ Calculation 5 0 5  Recall 0 3 3  Language- name 2 objects  0 0  Language- repeat '1 1 1  '$ Language- follow 3 step command  3 3  Language- read & follow direction  0 1  Write a sentence  0 0  Copy design  0 0  Total score  20 26        09/30/2022   11:42 AM  6CIT Screen  What Year? 0  points  What month? 0 points  What time? 0 points  Count back from 20 0 points  Months in reverse 0 points  Repeat phrase 10 points  Total Score 10 points    Immunizations Immunization History  Administered Date(s) Administered   Fluad Quad(high Dose 65+) 05/16/2022   Influenza Split 05/11/2011   Influenza Whole 05/27/2007, 05/21/2008, 04/29/2009, 05/28/2012   Influenza, High Dose Seasonal PF 05/12/2014, 05/20/2016, 05/26/2017, 05/11/2020, 05/26/2021   Influenza,inj,Quad PF,6+ Mos 05/08/2018, 04/15/2019   Influenza-Unspecified 05/07/2013   PFIZER(Purple Top)SARS-COV-2 Vaccination 09/11/2019, 10/02/2019, 06/10/2020   Pfizer Covid-19 Vaccine Bivalent Booster 55yr & up 08/13/2021   Pneumococcal Conjugate-13 10/25/2014   Pneumococcal Polysaccharide-23 08/27/2003   Td 12/26/2003   Tdap 11/07/2014   Zoster Recombinat (Shingrix) 04/15/2019, 06/15/2019   Zoster, Live 03/29/2008   Covid-19 vaccine status: Completed vaccines  Screening Tests Health Maintenance  Topic Date Due   COVID-19 Vaccine (5 - 2023-24 season) 10/16/2022 (Originally 04/26/2022)   MAMMOGRAM  05/07/2023   Medicare Annual Wellness (AWV)  10/01/2023   DTaP/Tdap/Td (3 - Td or Tdap) 11/06/2024   Pneumonia Vaccine 89 Years old  Completed   INFLUENZA VACCINE  Completed   DEXA SCAN  Completed   Zoster  Vaccines- Shingrix  Completed   HPV VACCINES  Aged Out   Health Maintenance There are no preventive care reminders to display for this patient.  Lung Cancer Screening: (Low Dose CT Chest recommended if Age 87-80years, 30 pack-year currently smoking OR have quit w/in 15years.) does not qualify.   Hepatitis C Screening: does not qualify.  Vision Screening: Recommended annual ophthalmology exams for early detection of glaucoma and other disorders of the eye.  Dental Screening: Recommended annual dental exams for proper oral hygiene.  Community Resource Referral / Chronic Care Management: CRR required this visit?  No   CCM required this visit?  No      Plan:   Patient agrees to call and schedule cpe with pcp.  I have personally reviewed and noted the following in the patient's chart:   Medical and social history Use of alcohol, tobacco or illicit drugs  Current medications and supplements including opioid prescriptions. Patient is not currently taking opioid prescriptions. Functional ability and status Nutritional status Physical activity Advanced directives List of other physicians Hospitalizations, surgeries, and ER visits in previous 12 months Vitals Screenings to include cognitive, depression, and falls Referrals and appointments  In addition, I have reviewed and discussed with patient certain preventive protocols, quality metrics, and best practice recommendations. A written personalized care plan for preventive services as well as general preventive health recommendations were provided to patient.     DLeta Jungling LPN   20/09/3341

## 2022-10-01 ENCOUNTER — Ambulatory Visit: Payer: Medicare Other | Admitting: Physical Therapy

## 2022-10-01 ENCOUNTER — Encounter: Payer: Self-pay | Admitting: Physical Therapy

## 2022-10-01 DIAGNOSIS — M5459 Other low back pain: Secondary | ICD-10-CM | POA: Diagnosis not present

## 2022-10-01 DIAGNOSIS — R262 Difficulty in walking, not elsewhere classified: Secondary | ICD-10-CM | POA: Diagnosis not present

## 2022-10-01 DIAGNOSIS — M62830 Muscle spasm of back: Secondary | ICD-10-CM

## 2022-10-01 DIAGNOSIS — M542 Cervicalgia: Secondary | ICD-10-CM | POA: Diagnosis not present

## 2022-10-01 NOTE — Therapy (Signed)
OUTPATIENT PHYSICAL THERAPY THORACOLUMBAR TREATMENT    Patient Name: Linda Mcgrath MRN: [id number] DOB:Apr 24, 1933, 89 y.o., female Today's Date: 10/01/2022   PT End of Session - 10/01/22 1252     Visit Number 21    Date for PT Re-Evaluation 10/30/22    Authorization Type [employer]    PT Start Time 1249    PT Stop Time 1335    PT Time Calculation (min) 46 min    Activity Tolerance Patient tolerated treatment well    Behavior During Therapy [facility] for tasks assessed/performed             Past Medical History:  Diagnosis Date   Allergic rhinitis    Arthritis    Asthma    Cataract    Bil/lens implant   Colon polyp 1999   small polyp   Diverticulosis    Dysrhythmia    Fibromyalgia    Hyperlipidemia    Hypothyroidism    Internal hemorrhoids    Leg cramps    Lichen planus    Lung nodule    stable LUL 9 mm   Menopausal syndrome    Osteoarthritis    UTI (urinary tract infection)    Past Surgical History:  Procedure Laterality Date   Abd U/S  11/1998   negative   Abd U/S  01/2001   gallbladder polyps   ABDOMINAL HYSTERECTOMY  1975   total-fibroids   APPENDECTOMY  1975   BREAST BIOPSY     benign   BREAST EXCISIONAL BIOPSY Left 1957   CARDIOVERSION N/A 03/07/2016   Procedure: CARDIOVERSION;  Surgeon: Yates Decamp, MD;  Location: [facility] ENDOSCOPY;  Service: Cardiovascular;  Laterality: N/A;   CATARACT EXTRACTION     bilateral   CHOLECYSTECTOMY     COLONOSCOPY  1998   Diverticulosis; polyp\   COLONOSCOPY  09/2002   Diverticulosis, hem   COLONOSCOPY  12/2007   diverticulosis, polyp   DEXA  10/2001   osteopenia   ELECTROPHYSIOLOGIC STUDY N/A 04/03/2016   Procedure: A-Flutter Ablation;  Surgeon: Will [name], MD;  Location: [facility] INVASIVE CV LAB;  Service: Cardiovascular;  Laterality: N/A;   ESOPHAGOGASTRODUODENOSCOPY  2004   Hida scan  01/2001   Negative   KNEE SURGERY     right knee / 11/2004   LAMINECTOMY WITH POSTERIOR LATERAL ARTHRODESIS LEVEL 2  N/A 07/31/2017   Procedure: DECOMPRESSIVE LAMINECTOMY LUMABR FOUR-FIVE, LUMBAR FIVE-SACRAL ONE;  Surgeon: Tia Alert, MD;  Location: [facility] OR;  Service: Neurosurgery;  Laterality: N/A;   laser surgery for glaucoma Left 09/21/2014   MULTIPLE TOOTH EXTRACTIONS     rentinal tear     ROTATOR CUFF REPAIR     x 2 /right shoulder   SKIN CANCER EXCISION     pre-melanoma / on face   TEE WITHOUT CARDIOVERSION N/A 03/07/2016   Procedure: TRANSESOPHAGEAL ECHOCARDIOGRAM (TEE);  Surgeon: Yates Decamp, MD;  Location: [facility] ENDOSCOPY;  Service: Cardiovascular;  Laterality: N/A;   TOE SURGERY     rt foot    Patient Active Problem List   Diagnosis Date Noted   Acute cystitis without hematuria 03/26/2022   Vaginal itching 03/26/2022   Urinary frequency 03/26/2022   Vaginal candida 03/26/2022   Chronic upper back pain 08/30/2020   Aortic atherosclerosis (HCC) 05/31/2020   Abnormal CT scan, small bowel 05/18/2020   Insomnia 12/15/2019   Chronic low back pain 10/18/2019   Osteoarthritis of right knee 08/02/2019   S/P lumbar spinal fusion 07/31/2017   H/O atrial flutter 03/06/2016  Obesity 11/06/2015   Urge incontinence 01/24/2015   Estrogen deficiency 10/25/2014   Lichen planus 12/20/2013   Prediabetes 06/03/2011   HYPERTENSION, BENIGN ESSENTIAL 10/23/2010   OSTEOARTHRITIS, HANDS, BILATERAL 01/24/2010   Hypothyroidism 08/24/2008   Vitamin D deficiency 06/06/2008   Hyperlipidemia 06/06/2008   MENOPAUSAL SYNDROME 03/29/2008   Osteoporosis 03/29/2008   Allergic rhinitis 01/25/2008   IBS 01/25/2008   OSTEOARTHRITIS 01/25/2008   FIBROMYALGIA 01/25/2008    PCP: [employer]  REFERRING PROVIDER: [name]  REFERRING DIAG: Back pain, difficulty walking, osteoporosis  Rationale for Evaluation and Treatment Rehabilitation  THERAPY DIAG:  Other low back pain  Muscle spasm of back  Cervicalgia  Difficulty in walking, not elsewhere classified  ONSET DATE: September 2023  SUBJECTIVE:                                                                                                                                                                                            SUBJECTIVE STATEMENT: Patient had a fall about 3-4 weeks ago, she has not been back since that time, she now has a life alert necklace, she reports that she has been very sore in the shoulders and the back.  She also reports having an eye surgery last week and will have the other eye done tomorrow.  We have not seen her often due to some of these issues.  PERTINENT HISTORY:  See above  PAIN:  Are you having pain? Yes: NPRS scale: 6/10 Pain location: low back, right knee, shoulders Pain description: ache, sore Aggravating factors: yardwork, hosuework pain up to 8/10 Relieving factors: rest, pain can be 0/10 at times   PRECAUTIONS: None  WEIGHT BEARING RESTRICTIONS: No  FALLS:  Has patient fallen in last 6 months? No  LIVING ENVIRONMENT: Lives with: lives with their family and lives alone Lives in: House/apartment Stairs: No Has following equipment at home: Single point cane  OCCUPATION: retired  PLOF: Independent able to do yardwork and housework  PATIENT GOALS: walk better, less pain   OBJECTIVE:   DIAGNOSTIC FINDINGS:  osteoporosis  PATIENT SURVEYS:  FOTO 31  COGNITION:  Overall cognitive status: Within functional limits for tasks assessed     SENSATION: WFL  MUSCLE LENGTH: Tight calves, HS and piriformis  POSTURE: rounded shoulders, forward head, and decreased lumbar lordosis  PALPATION: She is tight and tender in the lumbar area and into the thoracic and upper trap area, mild tenderness in the buttocks  LUMBAR ROM:   AROM eval  Flexion Decreased 50%  Extension Decreased 100% with pain  Right lateral flexion   Left lateral flexion   Right rotation   Left  rotation    (Blank rows = not tested)  LOWER EXTREMITY ROM:     Active  Right eval Left eval  Hip flexion    Hip extension     Hip abduction    Hip adduction    Hip internal rotation    Hip external rotation    Knee flexion    Knee extension    Ankle dorsiflexion    Ankle plantarflexion    Ankle inversion    Ankle eversion     (Blank rows = not tested)  LOWER EXTREMITY MMT:    MMT Right eval Left eval  Hip flexion 3+ 3+  Hip extension    Hip abduction 3+ 3+  Hip adduction    Hip internal rotation    Hip external rotation    Knee flexion 3+ with pain 3+  Knee extension 3+ with pain 3+  Ankle dorsiflexion 4- 4-  Ankle plantarflexion    Ankle inversion    Ankle eversion     (Blank rows = not tested)  FUNCTIONAL TESTS:  5 times sit to stand: 31 seconds: 10/01/22 30 seconds Timed up and go (TUG): 26 seconds with [facility]   07/25/22 TUG 22 seconds with SPC, 08/20/22 18 seconds with SPC, 10/01/22 23 seconds without device  GAIT: Distance walked: 60 feet Assistive device utilized: Single point cane Level of assistance: SBA Comments: slow, slightly stooped    TODAY'S TREATMENT:  [facility] Adult PT Treatment:                                                                                                                             10/01/22 Nustep level 5 x 8 minutes TUG 23 seconds without device 5XSTS 30 seconds 2.5# LAQ 2.5# marches Seated heel and toe raises Side stepping, backward walking Seated pelvic mobility STM to the upper traps and rhomboids and into the lumbar area  09/05/22 Nustep level 4 x 7 minutes Seated LAQ MArches Pball rollouts 3 ways Side bend elbow touches Toe taps/heel raises STM to the back and upper traps, no bruising but tender has a spot on the elbow with a band aid on reports that it is a blister from her trying to army crawl to the couch MHP/IFC in sitting to the low back  08/29/22 Nustep level 5 x 7 minutes Standing 5# straight arm pulls Pball roll outs Pball isometric abs Red tband AR press in sitting 2.5# LAQ 2.5# Hip abduction 2.5# marching Heel raises Toe  raises Calf stretches Gait without cane 200 feet SBA  08/27/22 P ball roll outs 2.5# LAQ 2.5# marches 2.5# hip abduction Pball in lap isometric abs Nustep level 5 x 6 minutes Leg curls 20# 2x10 No device 200 feet SBA walking slow pace and then up and over on the steps  08/22/22 Nustep level 5 x 7 minutes Leg curls 15# REd tband shoulder extension and rows Pelvic mobility and clocks 2.5# LAQ 2.5# marches P ball roll outs  3 ways Pball isometric abs Standing toe raise and calf raise Walking without device 220 feet x 2 SBA, slow pace   08/20/22 Nustep level 5 x 7 minutes Leg curls 15# 2x15 TUG 18 seconds with SPC LAQ 2.5# 2x10 Marches 2.5# 2x10 Seated hip abduction 2.5# Red tband rows and extension Toe and heel raises Passive stretch of the HS and calves  PATIENT EDUCATION:  Education details: POC Person educated: Patient Education method: Explanation Education comprehension: verbalized understanding   HOME EXERCISE PROGRAM: TBD  ASSESSMENT:  CLINICAL IMPRESSION: Patient had a fall two weeks ago, She is continuing to move much slower today.  She is still sore, had an eye surgery last week and will have the 2nd eye done tomorrow.  She has regressed since the fall and the fall was due to it raining and she had large rubber over boots on and tripped inside.  She is still better than when we started for the TUG and the 5XSTS but regressed since the last time we tested.  Would love to continue to see her to possibly work on fall prevention and balance OBJECTIVE IMPAIRMENTS: Abnormal gait, cardiopulmonary status limiting activity, decreased activity tolerance, decreased balance, decreased endurance, decreased mobility, difficulty walking, decreased ROM, decreased strength, increased muscle spasms, impaired flexibility, postural dysfunction, and pain.    REHAB POTENTIAL: Good  CLINICAL DECISION MAKING: Stable/uncomplicated  EVALUATION COMPLEXITY: Low   GOALS: Goals  reviewed with patient? Yes  SHORT TERM GOALS: Target date: 06/24/22  Independent with initial HEP Goal status: met  LONG TERM GOALS: Target date: 09/02/22  Decrease pain 50% Goal status: progressing 10/01/22  2.  Independent with an advanced HEP  Goal status:ongoing 10/01/22  3.  Decrease TUG time to 13 seconds Goal status: progressing 10/01/22  4.  Increase LE strength to 4/5 Goal status: progressing 10/01/22  5.  Walk 10 minutes with SPC and without rest Goal status:  progressing 10/01/22  PLAN: PT FREQUENCY: 1-2x/week  PT DURATION: 12 weeks  PLANNED INTERVENTIONS: Therapeutic exercises, Therapeutic activity, Neuromuscular re-education, Balance training, Gait training, Patient/Family education, Self Care, Joint mobilization, Stair training, Dry Needling, Electrical stimulation, Moist heat, and Manual therapy.  PLAN FOR NEXT SESSION: see if we can get her feeling better and moving again.  Work on fall prevention and [employer], PT 10/01/2022, 12:53 PM

## 2022-10-02 DIAGNOSIS — H401112 Primary open-angle glaucoma, right eye, moderate stage: Secondary | ICD-10-CM | POA: Diagnosis not present

## 2022-10-03 ENCOUNTER — Encounter: Payer: Self-pay | Admitting: Nurse Practitioner

## 2022-10-03 ENCOUNTER — Ambulatory Visit: Payer: Medicare Other | Admitting: Physical Therapy

## 2022-10-03 ENCOUNTER — Ambulatory Visit (INDEPENDENT_AMBULATORY_CARE_PROVIDER_SITE_OTHER): Payer: Medicare Other | Admitting: Nurse Practitioner

## 2022-10-03 VITALS — BP 122/72 | HR 60 | Ht 62.0 in | Wt 170.0 lb

## 2022-10-03 DIAGNOSIS — K529 Noninfective gastroenteritis and colitis, unspecified: Secondary | ICD-10-CM

## 2022-10-03 MED ORDER — CHOLESTYRAMINE 4 G PO PACK: 4.0000 g | PACK | Freq: Every day | ORAL | 3 refills | Status: DC

## 2022-10-03 NOTE — Progress Notes (Signed)
Assessment     # 89 yo female with chronic diarrhea. Suspect bile salt diarrhea in setting of cholecystectomy. Doing well on 4 grams of Cholestyramine daily.   # Remote history of adenomatous polyps in 2014.  Due to her she is no longer undergoing polyp surveillance colonoscopies  Plan   Refill Questran 4 grams once daily, # 90 with 4 refills.  Follow up in one year, sooner if needed.   HPI    Chief complaint: medication refill needed   Linda Mcgrath is an 89 yo female known to Dr. Henrene Pastor with a past medical history of asthma, hyperlipidemia, hypothyroidism, fibromyalgia, and chronic loose stool. See PMH/ PSH for additional history.    Linda Mcgrath was last seen here in Nov 2021 for ongoing management of loose stool felt to possibly be bile salt diarrhea in setting of a cholecystectomy. She takes one packet of questran in applesauce daily. She averages one solid BM / day.  No GI complaints . Weight is stable.   Labs:     Latest Ref Rng & Units 11/07/2021    9:17 AM 08/30/2020   12:27 PM 02/29/2020    2:10 PM  CBC  WBC 4.0 - 10.5 K/uL 7.2  7.8  7.5   Hemoglobin 12.0 - 15.0 g/dL 14.2  15.1  14.5   Hematocrit 36.0 - 46.0 % 42.3  44.4  42.8   Platelets 150.0 - 400.0 K/uL 304.0  258.0  262.0        Latest Ref Rng & Units 11/07/2021    9:17 AM 08/30/2020   12:27 PM 05/31/2020   12:05 PM  Hepatic Function  Total Protein 6.0 - 8.3 g/dL 6.7  7.0  7.0   Albumin 3.5 - 5.2 g/dL 4.3  4.4  4.1   AST 0 - 37 U/L '20  22  24   '$ ALT 0 - 35 U/L '16  23  21   '$ Alk Phosphatase 39 - 117 U/L 73  61  49   Total Bilirubin 0.2 - 1.2 mg/dL 0.7  0.7  0.7      Past Medical History:  Diagnosis Date   Allergic rhinitis    Arthritis    Asthma    Cataract    Bil/lens implant   Colon polyp 1999   small polyp   Diverticulosis    Dysrhythmia    Fibromyalgia    Hyperlipidemia    Hypothyroidism    Internal hemorrhoids    Leg cramps    Lichen planus    Lung nodule    stable LUL 9 mm   Menopausal syndrome     Osteoarthritis    UTI (urinary tract infection)     Past Surgical History:  Procedure Laterality Date   Abd U/S  11/1998   negative   Abd U/S  01/2001   gallbladder polyps   ABDOMINAL HYSTERECTOMY  1975   total-fibroids   APPENDECTOMY  1975   BREAST BIOPSY     benign   BREAST EXCISIONAL BIOPSY Left 1957   CARDIOVERSION N/A 03/07/2016   Procedure: CARDIOVERSION;  Surgeon: Adrian Prows, MD;  Location: [place];  Service: Cardiovascular;  Laterality: N/A;   CATARACT EXTRACTION     bilateral   CHOLECYSTECTOMY     COLONOSCOPY  1998   Diverticulosis; polyp\   COLONOSCOPY  09/2002   Diverticulosis, hem   COLONOSCOPY  12/2007   diverticulosis, polyp   DEXA  10/2001   osteopenia   ELECTROPHYSIOLOGIC STUDY N/A 04/03/2016   Procedure:  A-Flutter Ablation;  Surgeon: Will Meredith Leeds, MD;  Location: [facility] CV LAB;  Service: Cardiovascular;  Laterality: N/A;   ESOPHAGOGASTRODUODENOSCOPY  2004   Hida scan  01/2001   Negative   KNEE SURGERY     right knee / 11/2004   LAMINECTOMY WITH POSTERIOR LATERAL ARTHRODESIS LEVEL 2 N/A 07/31/2017   Procedure: DECOMPRESSIVE LAMINECTOMY LUMABR FOUR-FIVE, LUMBAR FIVE-SACRAL ONE;  Surgeon: Eustace Moore, MD;  Location: [place];  Service: Neurosurgery;  Laterality: N/A;   laser surgery for glaucoma Left 09/21/2014   MULTIPLE TOOTH EXTRACTIONS     rentinal tear     ROTATOR CUFF REPAIR     x 2 /right shoulder   SKIN CANCER EXCISION     pre-melanoma / on face   TEE WITHOUT CARDIOVERSION N/A 03/07/2016   Procedure: TRANSESOPHAGEAL ECHOCARDIOGRAM (TEE);  Surgeon: Adrian Prows, MD;  Location: [facility] ENDOSCOPY;  Service: Cardiovascular;  Laterality: N/A;   TOE SURGERY     rt foot     Current Medications, Allergies, Family History and Social History were reviewed in [employer] record.     Current Outpatient Medications  Medication Sig Dispense Refill   alendronate (FOSAMAX) 70 MG tablet Take 1 tablet (70 mg total) by mouth  every 7 (seven) days. Take with a full glass of water on an empty stomach. 4 tablet 11   Ascorbic Acid (VITAMIN C PO) Take 500 mg by mouth daily.     B Complex Vitamins (VITAMIN B COMPLEX PO) Take 1 tablet by mouth daily.     B COMPLEX, FOLIC ACID, PO Take 564 mcg by mouth daily.     Biotin 10 MG TABS Take 1 tablet by mouth every morning.      Calcium Citrate-Vitamin D 250-200 MG-UNIT TABS Take 1 tablet by mouth daily.     cholestyramine (QUESTRAN) 4 g packet MIX AND DRINK 1 PACKET BY MOUTH DAILY 90 each 0   Cranberry 180 MG CAPS Take 2 capsules by mouth daily.     dextromethorphan-guaiFENesin (MUCINEX DM) 30-600 MG 12hr tablet Take 1 tablet by mouth 2 (two) times daily. 15 tablet 0   Fexofenadine HCl (MUCINEX ALLERGY PO) Take 1 tablet by mouth daily as needed (Allergies).      fluconazole (DIFLUCAN) 150 MG tablet Take one pill by mouth today and repeat dose in 3 days 2 tablet 0   fluticasone (FLONASE) 50 MCG/ACT nasal spray Place 1 spray into both nostrils daily for 3 days. 16 g 0   folic acid (FOLVITE) 1 MG tablet Take 1 mg by mouth daily.     levothyroxine (SYNTHROID) 88 MCG tablet Take 1 tablet (88 mcg total) by mouth daily before breakfast. 90 tablet 2   loratadine (CLARITIN) 10 MG tablet Take 10 mg by mouth daily as needed for allergies.      MAGNESIUM-OXIDE PO Take 1 tablet by mouth daily.     Methenamine-Sodium Salicylate (AZO URINARY TRACT DEFENSE PO) Take 1 capsule by mouth daily.     Multiple Vitamin (MULTIVITAMIN) capsule Take 1 capsule by mouth daily.     Psyllium (METAMUCIL FIBER PO) Take by mouth at bedtime. 2 tablespoon     Pumpkin Seed-Soy Germ (AZO BLADDER CONTROL/GO-LESS PO) Take 1 tablet by mouth 2 (two) times a week.     solifenacin (VESICARE) 5 MG tablet TAKE 1 TABLET(5 MG) BY MOUTH DAILY 30 tablet 11   TURMERIC PO Take 1 capsule by mouth 2 (two) times daily.      vitamin  B-12 (CYANOCOBALAMIN) 500 MCG tablet Take 500 mcg by mouth daily.     Vitamin D, Cholecalciferol, 50  MCG (2000 UT) CAPS Take 2 tablets by mouth daily.     No current facility-administered medications for this visit.    Review of Systems: No chest pain. No shortness of breath. No urinary complaints.    Physical Exam  Wt Readings from Last 3 Encounters:  09/30/22 165 lb (74.8 kg)  05/16/22 165 lb 2 oz (74.9 kg)  04/04/22 165 lb 12.8 oz (75.2 kg)    LMP 08/26/1969  BP 122/72, HR 60 Constitutional:  Pleasant, generally well appearing female in no acute distress. Psychiatric: Normal mood and affect. Behavior is normal. EENT: Pupils normal.  Conjunctivae are normal. No scleral icterus. Neck supple.  Cardiovascular: Normal rate, regular rhythm.  Pulmonary/chest: Effort normal and breath sounds normal. No wheezing, rales or rhonchi. Abdominal: Soft, nondistended, nontender. Bowel sounds active throughout. There are no masses palpable. No hepatomegaly. Neurological: Alert and oriented to person place and time. Skin: Skin is warm and dry. No rashes noted.  Tye Savoy, NP  10/03/2022, 8:24 AM  Cc:  [name], Wynelle Fanny, MD

## 2022-10-03 NOTE — Patient Instructions (Signed)
We have sent the following medications to your pharmacy for you to pick up at your convenience: Questran 4 g daily.   Follow up in 1 year or sooner if needed.  _______________________________________________________  If your blood pressure at your visit was 140/90 or greater, please contact your primary care physician to follow up on this.  _______________________________________________________  If you are age 87 or older, your body mass index should be between 23-30. Your Body mass index is 31.09 kg/m. If this is out of the aforementioned range listed, please consider follow up with your Primary Care Provider.  If you are age 47 or younger, your body mass index should be between 19-25. Your Body mass index is 31.09 kg/m. If this is out of the aformentioned range listed, please consider follow up with your Primary Care Provider.   ________________________________________________________  The [place] [facility] providers would like to encourage you to use [facility] to communicate with providers for non-urgent requests or questions.  Due to long hold times on the telephone, sending your provider a message by [facility] may be a faster and more efficient way to get a response.  Please allow 48 business hours for a response.  Please remember that this is for non-urgent requests.  _______________________________________________________

## 2022-10-03 NOTE — Progress Notes (Signed)
Noted  

## 2022-10-08 ENCOUNTER — Encounter: Payer: Self-pay | Admitting: Physical Therapy

## 2022-10-08 ENCOUNTER — Ambulatory Visit: Payer: Medicare Other | Admitting: Physical Therapy

## 2022-10-08 DIAGNOSIS — M542 Cervicalgia: Secondary | ICD-10-CM | POA: Diagnosis not present

## 2022-10-08 DIAGNOSIS — M5459 Other low back pain: Secondary | ICD-10-CM

## 2022-10-08 DIAGNOSIS — R262 Difficulty in walking, not elsewhere classified: Secondary | ICD-10-CM | POA: Diagnosis not present

## 2022-10-08 DIAGNOSIS — M62830 Muscle spasm of back: Secondary | ICD-10-CM

## 2022-10-08 NOTE — Therapy (Signed)
OUTPATIENT PHYSICAL THERAPY THORACOLUMBAR TREATMENT    Patient Name: Linda Mcgrath MRN: [id number] DOB:1933-01-19, 89 y.o., female Today's Date: 10/08/2022   PT End of Session - 10/08/22 1315     Visit Number 22    Date for PT Re-Evaluation 10/30/22    Authorization Type [employer]    PT Start Time 1312    PT Stop Time 1400    PT Time Calculation (min) 48 min    Activity Tolerance Patient tolerated treatment well    Behavior During Therapy [facility] for tasks assessed/performed             Past Medical History:  Diagnosis Date   Allergic rhinitis    Arthritis    Asthma    Cataract    Bil/lens implant   Colon polyp 1999   small polyp   Diverticulosis    Dysrhythmia    Fibromyalgia    Hyperlipidemia    Hypothyroidism    Internal hemorrhoids    Leg cramps    Lichen planus    Lung nodule    stable LUL 9 mm   Menopausal syndrome    Osteoarthritis    UTI (urinary tract infection)    Past Surgical History:  Procedure Laterality Date   Abd U/S  11/1998   negative   Abd U/S  01/2001   gallbladder polyps   ABDOMINAL HYSTERECTOMY  1975   total-fibroids   APPENDECTOMY  1975   BREAST BIOPSY     benign   BREAST EXCISIONAL BIOPSY Left 1957   CARDIOVERSION N/A 03/07/2016   Procedure: CARDIOVERSION;  Surgeon: Yates Decamp, MD;  Location: [facility] ENDOSCOPY;  Service: Cardiovascular;  Laterality: N/A;   CATARACT EXTRACTION     bilateral   CHOLECYSTECTOMY     COLONOSCOPY  1998   Diverticulosis; polyp\   COLONOSCOPY  09/2002   Diverticulosis, hem   COLONOSCOPY  12/2007   diverticulosis, polyp   DEXA  10/2001   osteopenia   ELECTROPHYSIOLOGIC STUDY N/A 04/03/2016   Procedure: A-Flutter Ablation;  Surgeon: Will [name], MD;  Location: [facility] INVASIVE CV LAB;  Service: Cardiovascular;  Laterality: N/A;   ESOPHAGOGASTRODUODENOSCOPY  2004   Hida scan  01/2001   Negative   KNEE SURGERY     right knee / 11/2004   LAMINECTOMY WITH POSTERIOR LATERAL ARTHRODESIS LEVEL 2  N/A 07/31/2017   Procedure: DECOMPRESSIVE LAMINECTOMY LUMABR FOUR-FIVE, LUMBAR FIVE-SACRAL ONE;  Surgeon: Tia Alert, MD;  Location: [facility] OR;  Service: Neurosurgery;  Laterality: N/A;   laser surgery for glaucoma Left 09/21/2014   MULTIPLE TOOTH EXTRACTIONS     rentinal tear     ROTATOR CUFF REPAIR     x 2 /right shoulder   SKIN CANCER EXCISION     pre-melanoma / on face   TEE WITHOUT CARDIOVERSION N/A 03/07/2016   Procedure: TRANSESOPHAGEAL ECHOCARDIOGRAM (TEE);  Surgeon: Yates Decamp, MD;  Location: [facility] ENDOSCOPY;  Service: Cardiovascular;  Laterality: N/A;   TOE SURGERY     rt foot    Patient Active Problem List   Diagnosis Date Noted   Acute cystitis without hematuria 03/26/2022   Vaginal itching 03/26/2022   Urinary frequency 03/26/2022   Vaginal candida 03/26/2022   Chronic upper back pain 08/30/2020   Aortic atherosclerosis (HCC) 05/31/2020   Abnormal CT scan, small bowel 05/18/2020   Insomnia 12/15/2019   Chronic low back pain 10/18/2019   Osteoarthritis of right knee 08/02/2019   S/P lumbar spinal fusion 07/31/2017   H/O atrial flutter 03/06/2016  Obesity 11/06/2015   Urge incontinence 01/24/2015   Estrogen deficiency 10/25/2014   Lichen planus 12/20/2013   Prediabetes 06/03/2011   HYPERTENSION, BENIGN ESSENTIAL 10/23/2010   OSTEOARTHRITIS, HANDS, BILATERAL 01/24/2010   Hypothyroidism 08/24/2008   Vitamin D deficiency 06/06/2008   Hyperlipidemia 06/06/2008   MENOPAUSAL SYNDROME 03/29/2008   Osteoporosis 03/29/2008   Allergic rhinitis 01/25/2008   IBS 01/25/2008   OSTEOARTHRITIS 01/25/2008   FIBROMYALGIA 01/25/2008    PCP: [employer]  REFERRING PROVIDER: [name]  REFERRING DIAG: Back pain, difficulty walking, osteoporosis  Rationale for Evaluation and Treatment Rehabilitation  THERAPY DIAG:  Other low back pain  Muscle spasm of back  Cervicalgia  Difficulty in walking, not elsewhere classified  ONSET DATE: September 2023  SUBJECTIVE:                                                                                                                                                                                            SUBJECTIVE STATEMENT: No falls, reports knee soreness thinks it is the colder weather  PERTINENT HISTORY:  See above  PAIN:  Are you having pain? Yes: NPRS scale: 7/10 Pain location: knees Pain description: ache, sore Aggravating factors: yardwork, hosuework pain up to 8/10 Relieving factors: rest, pain can be 0/10 at times   PRECAUTIONS: None  WEIGHT BEARING RESTRICTIONS: No  FALLS:  Has patient fallen in last 6 months? No  LIVING ENVIRONMENT: Lives with: lives with their family and lives alone Lives in: House/apartment Stairs: No Has following equipment at home: Single point cane  OCCUPATION: retired  PLOF: Independent able to do yardwork and housework  PATIENT GOALS: walk better, less pain   OBJECTIVE:   DIAGNOSTIC FINDINGS:  osteoporosis  PATIENT SURVEYS:  FOTO 31  COGNITION:  Overall cognitive status: Within functional limits for tasks assessed     SENSATION: WFL  MUSCLE LENGTH: Tight calves, HS and piriformis  POSTURE: rounded shoulders, forward head, and decreased lumbar lordosis  PALPATION: She is tight and tender in the lumbar area and into the thoracic and upper trap area, mild tenderness in the buttocks  LUMBAR ROM:   AROM eval  Flexion Decreased 50%  Extension Decreased 100% with pain  Right lateral flexion   Left lateral flexion   Right rotation   Left rotation    (Blank rows = not tested)  LOWER EXTREMITY ROM:     Active  Right eval Left eval  Hip flexion    Hip extension    Hip abduction    Hip adduction    Hip internal rotation    Hip external rotation    Knee flexion  Knee extension    Ankle dorsiflexion    Ankle plantarflexion    Ankle inversion    Ankle eversion     (Blank rows = not tested)  LOWER EXTREMITY MMT:    MMT Right eval Left eval   Hip flexion 3+ 3+  Hip extension    Hip abduction 3+ 3+  Hip adduction    Hip internal rotation    Hip external rotation    Knee flexion 3+ with pain 3+  Knee extension 3+ with pain 3+  Ankle dorsiflexion 4- 4-  Ankle plantarflexion    Ankle inversion    Ankle eversion     (Blank rows = not tested)  FUNCTIONAL TESTS:  5 times sit to stand: 31 seconds: 10/01/22 30 seconds Timed up and go (TUG): 26 seconds with [facility]   07/25/22 TUG 22 seconds with SPC, 08/20/22 18 seconds with SPC, 10/01/22 23 seconds without device  GAIT: Distance walked: 60 feet Assistive device utilized: Single point cane Level of assistance: SBA Comments: slow, slightly stooped    TODAY'S TREATMENT:  [facility] Adult PT Treatment:                                                                                                                             10/08/22 Nustep level 5 x 8 minutes Red tband HS curls Calf stretches with assist Marches and LAQ without weight Toe and heel raises in sitting She does have some palpable and visible swelling in th eright medial kne Red tband HS curls Red tband hip abduction Seated red tband rows and extensions Ball in lap isometrics  10/01/22 Nustep level 5 x 8 minutes TUG 23 seconds without device 5XSTS 30 seconds 2.5# LAQ 2.5# marches Seated heel and toe raises Side stepping, backward walking Seated pelvic mobility STM to the upper traps and rhomboids and into the lumbar area  09/05/22 Nustep level 4 x 7 minutes Seated LAQ MArches Pball rollouts 3 ways Side bend elbow touches Toe taps/heel raises STM to the back and upper traps, no bruising but tender has a spot on the elbow with a band aid on reports that it is a blister from her trying to army crawl to the couch MHP/IFC in sitting to the low back  08/29/22 Nustep level 5 x 7 minutes Standing 5# straight arm pulls Pball roll outs Pball isometric abs Red tband AR press in sitting 2.5# LAQ 2.5# Hip  abduction 2.5# marching Heel raises Toe raises Calf stretches Gait without cane 200 feet SBA  08/27/22 P ball roll outs 2.5# LAQ 2.5# marches 2.5# hip abduction Pball in lap isometric abs Nustep level 5 x 6 minutes Leg curls 20# 2x10 No device 200 feet SBA walking slow pace and then up and over on the steps  08/22/22 Nustep level 5 x 7 minutes Leg curls 15# REd tband shoulder extension and rows Pelvic mobility and clocks 2.5# LAQ 2.5# marches P ball roll outs 3 ways Pball  isometric abs Standing toe raise and calf raise Walking without device 220 feet x 2 SBA, slow pace   08/20/22 Nustep level 5 x 7 minutes Leg curls 15# 2x15 TUG 18 seconds with SPC LAQ 2.5# 2x10 Marches 2.5# 2x10 Seated hip abduction 2.5# Red tband rows and extension Toe and heel raises Passive stretch of the HS and calves  PATIENT EDUCATION:  Education details: POC Person educated: Patient Education method: Explanation Education comprehension: verbalized understanding   HOME EXERCISE PROGRAM: TBD  ASSESSMENT:  CLINICAL IMPRESSION: Patient comes in today moving slower than her normal, she c/o knee pain, no recent falls, she seems to have visible and palpable swelling in the right medial knee.  She thinks it is the "cold wind blowing", denies any recent increase in activity, I decreased the weights and the exercises to see if this would get her moving and help with pain OBJECTIVE IMPAIRMENTS: Abnormal gait, cardiopulmonary status limiting activity, decreased activity tolerance, decreased balance, decreased endurance, decreased mobility, difficulty walking, decreased ROM, decreased strength, increased muscle spasms, impaired flexibility, postural dysfunction, and pain.    REHAB POTENTIAL: Good  CLINICAL DECISION MAKING: Stable/uncomplicated  EVALUATION COMPLEXITY: Low   GOALS: Goals reviewed with patient? Yes  SHORT TERM GOALS: Target date: 06/24/22  Independent with initial HEP Goal  status: met  LONG TERM GOALS: Target date: 09/02/22  Decrease pain 50% Goal status: progressing 10/01/22  2.  Independent with an advanced HEP  Goal status:ongoing 10/01/22  3.  Decrease TUG time to 13 seconds Goal status: progressing 10/01/22  4.  Increase LE strength to 4/5 Goal status: progressing 10/01/22  5.  Walk 10 minutes with SPC and without rest Goal status:  progressing 10/01/22  PLAN: PT FREQUENCY: 1-2x/week  PT DURATION: 12 weeks  PLANNED INTERVENTIONS: Therapeutic exercises, Therapeutic activity, Neuromuscular re-education, Balance training, Gait training, Patient/Family education, Self Care, Joint mobilization, Stair training, Dry Needling, Electrical stimulation, Moist heat, and Manual therapy.  PLAN FOR NEXT SESSION:  Work on fall prevention and [employer], PT 10/08/2022, 1:18 PM

## 2022-10-10 ENCOUNTER — Ambulatory Visit: Payer: Medicare Other | Admitting: Physical Therapy

## 2022-10-15 ENCOUNTER — Encounter: Payer: Self-pay | Admitting: Physical Therapy

## 2022-10-15 ENCOUNTER — Ambulatory Visit: Payer: Medicare Other | Admitting: Physical Therapy

## 2022-10-15 DIAGNOSIS — R262 Difficulty in walking, not elsewhere classified: Secondary | ICD-10-CM | POA: Diagnosis not present

## 2022-10-15 DIAGNOSIS — M62830 Muscle spasm of back: Secondary | ICD-10-CM

## 2022-10-15 DIAGNOSIS — M5459 Other low back pain: Secondary | ICD-10-CM | POA: Diagnosis not present

## 2022-10-15 DIAGNOSIS — M542 Cervicalgia: Secondary | ICD-10-CM | POA: Diagnosis not present

## 2022-10-15 NOTE — Therapy (Signed)
OUTPATIENT PHYSICAL THERAPY THORACOLUMBAR TREATMENT    Patient Name: Linda Mcgrath MRN: [id number] DOB:November 19, 1932, 89 y.o., female Today's Date: 10/15/2022   PT End of Session - 10/15/22 1256     Visit Number 23    Date for PT Re-Evaluation 10/30/22    Authorization Type [employer]    PT Start Time 1258    PT Stop Time 1347    PT Time Calculation (min) 49 min    Activity Tolerance Patient tolerated treatment well    Behavior During Therapy [facility] for tasks assessed/performed             Past Medical History:  Diagnosis Date   Allergic rhinitis    Arthritis    Asthma    Cataract    Bil/lens implant   Colon polyp 1999   small polyp   Diverticulosis    Dysrhythmia    Fibromyalgia    Hyperlipidemia    Hypothyroidism    Internal hemorrhoids    Leg cramps    Lichen planus    Lung nodule    stable LUL 9 mm   Menopausal syndrome    Osteoarthritis    UTI (urinary tract infection)    Past Surgical History:  Procedure Laterality Date   Abd U/S  11/1998   negative   Abd U/S  01/2001   gallbladder polyps   ABDOMINAL HYSTERECTOMY  1975   total-fibroids   APPENDECTOMY  1975   BREAST BIOPSY     benign   BREAST EXCISIONAL BIOPSY Left 1957   CARDIOVERSION N/A 03/07/2016   Procedure: CARDIOVERSION;  Surgeon: Yates Decamp, MD;  Location: [facility] ENDOSCOPY;  Service: Cardiovascular;  Laterality: N/A;   CATARACT EXTRACTION     bilateral   CHOLECYSTECTOMY     COLONOSCOPY  1998   Diverticulosis; polyp\   COLONOSCOPY  09/2002   Diverticulosis, hem   COLONOSCOPY  12/2007   diverticulosis, polyp   DEXA  10/2001   osteopenia   ELECTROPHYSIOLOGIC STUDY N/A 04/03/2016   Procedure: A-Flutter Ablation;  Surgeon: Will [name], MD;  Location: [facility] INVASIVE CV LAB;  Service: Cardiovascular;  Laterality: N/A;   ESOPHAGOGASTRODUODENOSCOPY  2004   Hida scan  01/2001   Negative   KNEE SURGERY     right knee / 11/2004   LAMINECTOMY WITH POSTERIOR LATERAL ARTHRODESIS LEVEL 2  N/A 07/31/2017   Procedure: DECOMPRESSIVE LAMINECTOMY LUMABR FOUR-FIVE, LUMBAR FIVE-SACRAL ONE;  Surgeon: Tia Alert, MD;  Location: [facility] OR;  Service: Neurosurgery;  Laterality: N/A;   laser surgery for glaucoma Left 09/21/2014   MULTIPLE TOOTH EXTRACTIONS     rentinal tear     ROTATOR CUFF REPAIR     x 2 /right shoulder   SKIN CANCER EXCISION     pre-melanoma / on face   TEE WITHOUT CARDIOVERSION N/A 03/07/2016   Procedure: TRANSESOPHAGEAL ECHOCARDIOGRAM (TEE);  Surgeon: Yates Decamp, MD;  Location: [facility] ENDOSCOPY;  Service: Cardiovascular;  Laterality: N/A;   TOE SURGERY     rt foot    Patient Active Problem List   Diagnosis Date Noted   Acute cystitis without hematuria 03/26/2022   Vaginal itching 03/26/2022   Urinary frequency 03/26/2022   Vaginal candida 03/26/2022   Chronic upper back pain 08/30/2020   Aortic atherosclerosis (HCC) 05/31/2020   Abnormal CT scan, small bowel 05/18/2020   Insomnia 12/15/2019   Chronic low back pain 10/18/2019   Osteoarthritis of right knee 08/02/2019   S/P lumbar spinal fusion 07/31/2017   H/O atrial flutter 03/06/2016  Obesity 11/06/2015   Urge incontinence 01/24/2015   Estrogen deficiency 10/25/2014   Lichen planus 12/20/2013   Prediabetes 06/03/2011   HYPERTENSION, BENIGN ESSENTIAL 10/23/2010   OSTEOARTHRITIS, HANDS, BILATERAL 01/24/2010   Hypothyroidism 08/24/2008   Vitamin D deficiency 06/06/2008   Hyperlipidemia 06/06/2008   MENOPAUSAL SYNDROME 03/29/2008   Osteoporosis 03/29/2008   Allergic rhinitis 01/25/2008   IBS 01/25/2008   OSTEOARTHRITIS 01/25/2008   FIBROMYALGIA 01/25/2008    PCP: [employer]  REFERRING PROVIDER: [name]  REFERRING DIAG: Back pain, difficulty walking, osteoporosis  Rationale for Evaluation and Treatment Rehabilitation  THERAPY DIAG:  Other low back pain  Muscle spasm of back  Difficulty in walking, not elsewhere classified  ONSET DATE: September 2023  SUBJECTIVE:                                                                                                                                                                                            SUBJECTIVE STATEMENT: Reports that she had eye surgery recently doing better with that, reports due to cold weather she has not been able to walk much  PERTINENT HISTORY:  See above  PAIN:  Are you having pain? Yes: NPRS scale: 5/10 Pain location: knees Pain description: ache, sore Aggravating factors: yardwork, hosuework pain up to 8/10 Relieving factors: rest, pain can be 0/10 at times   PRECAUTIONS: None  WEIGHT BEARING RESTRICTIONS: No  FALLS:  Has patient fallen in last 6 months? No  LIVING ENVIRONMENT: Lives with: lives with their family and lives alone Lives in: House/apartment Stairs: No Has following equipment at home: Single point cane  OCCUPATION: retired  PLOF: Independent able to do yardwork and housework  PATIENT GOALS: walk better, less pain   OBJECTIVE:   DIAGNOSTIC FINDINGS:  osteoporosis  PATIENT SURVEYS:  FOTO 31  COGNITION:  Overall cognitive status: Within functional limits for tasks assessed     SENSATION: WFL  MUSCLE LENGTH: Tight calves, HS and piriformis  POSTURE: rounded shoulders, forward head, and decreased lumbar lordosis  PALPATION: She is tight and tender in the lumbar area and into the thoracic and upper trap area, mild tenderness in the buttocks  LUMBAR ROM:   AROM eval  Flexion Decreased 50%  Extension Decreased 100% with pain  Right lateral flexion   Left lateral flexion   Right rotation   Left rotation    (Blank rows = not tested)  LOWER EXTREMITY ROM:     Active  Right eval Left eval  Hip flexion    Hip extension    Hip abduction    Hip adduction    Hip internal rotation  Hip external rotation    Knee flexion    Knee extension    Ankle dorsiflexion    Ankle plantarflexion    Ankle inversion    Ankle eversion     (Blank rows = not  tested)  LOWER EXTREMITY MMT:    MMT Right eval Left eval  Hip flexion 3+ 3+  Hip extension    Hip abduction 3+ 3+  Hip adduction    Hip internal rotation    Hip external rotation    Knee flexion 3+ with pain 3+  Knee extension 3+ with pain 3+  Ankle dorsiflexion 4- 4-  Ankle plantarflexion    Ankle inversion    Ankle eversion     (Blank rows = not tested)  FUNCTIONAL TESTS:  5 times sit to stand: 31 seconds: 10/01/22 30 seconds Timed up and go (TUG): 26 seconds with [facility]   07/25/22 TUG 22 seconds with SPC, 08/20/22 18 seconds with SPC, 10/01/22 23 seconds without device  GAIT: Distance walked: 60 feet Assistive device utilized: Single point cane Level of assistance: SBA Comments: slow, slightly stooped    TODAY'S TREATMENT:  [facility] Adult PT Treatment:                                                                                                                             10/15/22 Nustep level 5 x 6 minutes Gait outside around the parking lot SBA Seated marches and LAQ2.5# Seated hip abduction Toe taps and heel raises Standing hip abduction and extension  10/08/22 Nustep level 5 x 8 minutes Red tband HS curls Calf stretches with assist Marches and LAQ without weight Toe and heel raises in sitting She does have some palpable and visible swelling in th eright medial kne Red tband HS curls Red tband hip abduction Seated red tband rows and extensions Ball in lap isometrics  10/01/22 Nustep level 5 x 8 minutes TUG 23 seconds without device 5XSTS 30 seconds 2.5# LAQ 2.5# marches Seated heel and toe raises Side stepping, backward walking Seated pelvic mobility STM to the upper traps and rhomboids and into the lumbar area  09/05/22 Nustep level 4 x 7 minutes Seated LAQ MArches Pball rollouts 3 ways Side bend elbow touches Toe taps/heel raises STM to the back and upper traps, no bruising but tender has a spot on the elbow with a band aid on reports that it is a  blister from her trying to army crawl to the couch MHP/IFC in sitting to the low back  08/29/22 Nustep level 5 x 7 minutes Standing 5# straight arm pulls Pball roll outs Pball isometric abs Red tband AR press in sitting 2.5# LAQ 2.5# Hip abduction 2.5# marching Heel raises Toe raises Calf stretches Gait without cane 200 feet SBA  08/27/22 P ball roll outs 2.5# LAQ 2.5# marches 2.5# hip abduction Pball in lap isometric abs Nustep level 5 x 6 minutes Leg curls 20# 2x10 No device 200 feet SBA  walking slow pace and then up and over on the steps  PATIENT EDUCATION:  Education details: POC Person educated: Patient Education method: Explanation Education comprehension: verbalized understanding   HOME EXERCISE PROGRAM: TBD  ASSESSMENT:  CLINICAL IMPRESSION: Patient reports that she is feeling a little better, she has had some pain and stiffness.  She reports that she has not been able to walk much due to it being too cold for her.  She did have recent eye surgeries and has not been able to come in due to the restrictions after the eye surgeries  OBJECTIVE IMPAIRMENTS: Abnormal gait, cardiopulmonary status limiting activity, decreased activity tolerance, decreased balance, decreased endurance, decreased mobility, difficulty walking, decreased ROM, decreased strength, increased muscle spasms, impaired flexibility, postural dysfunction, and pain.    REHAB POTENTIAL: Good  CLINICAL DECISION MAKING: Stable/uncomplicated  EVALUATION COMPLEXITY: Low   GOALS: Goals reviewed with patient? Yes  SHORT TERM GOALS: Target date: 06/24/22  Independent with initial HEP Goal status: met  LONG TERM GOALS: Target date: 09/02/22  Decrease pain 50% Goal status: progressing 10/01/22  2.  Independent with an advanced HEP  Goal status:ongoing 10/01/22  3.  Decrease TUG time to 13 seconds Goal status: progressing 10/01/22  4.  Increase LE strength to 4/5 Goal status: progressing  10/01/22  5.  Walk 10 minutes with SPC and without rest Goal status:  progressing 10/01/22  PLAN: PT FREQUENCY: 1-2x/week  PT DURATION: 12 weeks  PLANNED INTERVENTIONS: Therapeutic exercises, Therapeutic activity, Neuromuscular re-education, Balance training, Gait training, Patient/Family education, Self Care, Joint mobilization, Stair training, Dry Needling, Electrical stimulation, Moist heat, and Manual therapy.  PLAN FOR NEXT SESSION:  Work on fall prevention and [employer], PT 10/15/2022, 1:07 PM

## 2022-10-17 ENCOUNTER — Encounter: Payer: Self-pay | Admitting: Physical Therapy

## 2022-10-17 ENCOUNTER — Ambulatory Visit: Payer: Medicare Other | Admitting: Physical Therapy

## 2022-10-17 DIAGNOSIS — M542 Cervicalgia: Secondary | ICD-10-CM | POA: Diagnosis not present

## 2022-10-17 DIAGNOSIS — R262 Difficulty in walking, not elsewhere classified: Secondary | ICD-10-CM

## 2022-10-17 DIAGNOSIS — M5459 Other low back pain: Secondary | ICD-10-CM | POA: Diagnosis not present

## 2022-10-17 DIAGNOSIS — M62830 Muscle spasm of back: Secondary | ICD-10-CM | POA: Diagnosis not present

## 2022-10-17 NOTE — Therapy (Signed)
OUTPATIENT PHYSICAL THERAPY THORACOLUMBAR TREATMENT    Patient Name: Linda Mcgrath MRN: [id number] DOB:06/11/1933, 89 y.o., female Today's Date: 10/17/2022   PT End of Session - 10/17/22 1251     Visit Number 24    Date for PT Re-Evaluation 10/30/22    Authorization Type [employer]    PT Start Time 1251    PT Stop Time 1342    PT Time Calculation (min) 51 min    Activity Tolerance Patient tolerated treatment well    Behavior During Therapy [facility] for tasks assessed/performed             Past Medical History:  Diagnosis Date   Allergic rhinitis    Arthritis    Asthma    Cataract    Bil/lens implant   Colon polyp 1999   small polyp   Diverticulosis    Dysrhythmia    Fibromyalgia    Hyperlipidemia    Hypothyroidism    Internal hemorrhoids    Leg cramps    Lichen planus    Lung nodule    stable LUL 9 mm   Menopausal syndrome    Osteoarthritis    UTI (urinary tract infection)    Past Surgical History:  Procedure Laterality Date   Abd U/S  11/1998   negative   Abd U/S  01/2001   gallbladder polyps   ABDOMINAL HYSTERECTOMY  1975   total-fibroids   APPENDECTOMY  1975   BREAST BIOPSY     benign   BREAST EXCISIONAL BIOPSY Left 1957   CARDIOVERSION N/A 03/07/2016   Procedure: CARDIOVERSION;  Surgeon: Yates Decamp, MD;  Location: [facility] ENDOSCOPY;  Service: Cardiovascular;  Laterality: N/A;   CATARACT EXTRACTION     bilateral   CHOLECYSTECTOMY     COLONOSCOPY  1998   Diverticulosis; polyp\   COLONOSCOPY  09/2002   Diverticulosis, hem   COLONOSCOPY  12/2007   diverticulosis, polyp   DEXA  10/2001   osteopenia   ELECTROPHYSIOLOGIC STUDY N/A 04/03/2016   Procedure: A-Flutter Ablation;  Surgeon: Will [name], MD;  Location: [facility] INVASIVE CV LAB;  Service: Cardiovascular;  Laterality: N/A;   ESOPHAGOGASTRODUODENOSCOPY  2004   Hida scan  01/2001   Negative   KNEE SURGERY     right knee / 11/2004   LAMINECTOMY WITH POSTERIOR LATERAL ARTHRODESIS LEVEL 2  N/A 07/31/2017   Procedure: DECOMPRESSIVE LAMINECTOMY LUMABR FOUR-FIVE, LUMBAR FIVE-SACRAL ONE;  Surgeon: Tia Alert, MD;  Location: [facility] OR;  Service: Neurosurgery;  Laterality: N/A;   laser surgery for glaucoma Left 09/21/2014   MULTIPLE TOOTH EXTRACTIONS     rentinal tear     ROTATOR CUFF REPAIR     x 2 /right shoulder   SKIN CANCER EXCISION     pre-melanoma / on face   TEE WITHOUT CARDIOVERSION N/A 03/07/2016   Procedure: TRANSESOPHAGEAL ECHOCARDIOGRAM (TEE);  Surgeon: Yates Decamp, MD;  Location: [facility] ENDOSCOPY;  Service: Cardiovascular;  Laterality: N/A;   TOE SURGERY     rt foot    Patient Active Problem List   Diagnosis Date Noted   Acute cystitis without hematuria 03/26/2022   Vaginal itching 03/26/2022   Urinary frequency 03/26/2022   Vaginal candida 03/26/2022   Chronic upper back pain 08/30/2020   Aortic atherosclerosis (HCC) 05/31/2020   Abnormal CT scan, small bowel 05/18/2020   Insomnia 12/15/2019   Chronic low back pain 10/18/2019   Osteoarthritis of right knee 08/02/2019   S/P lumbar spinal fusion 07/31/2017   H/O atrial flutter 03/06/2016  Obesity 11/06/2015   Urge incontinence 01/24/2015   Estrogen deficiency 10/25/2014   Lichen planus 12/20/2013   Prediabetes 06/03/2011   HYPERTENSION, BENIGN ESSENTIAL 10/23/2010   OSTEOARTHRITIS, HANDS, BILATERAL 01/24/2010   Hypothyroidism 08/24/2008   Vitamin D deficiency 06/06/2008   Hyperlipidemia 06/06/2008   MENOPAUSAL SYNDROME 03/29/2008   Osteoporosis 03/29/2008   Allergic rhinitis 01/25/2008   IBS 01/25/2008   OSTEOARTHRITIS 01/25/2008   FIBROMYALGIA 01/25/2008    PCP: [employer]  REFERRING PROVIDER: [name]  REFERRING DIAG: Back pain, difficulty walking, osteoporosis  Rationale for Evaluation and Treatment Rehabilitation  THERAPY DIAG:  Other low back pain  Muscle spasm of back  Difficulty in walking, not elsewhere classified  Cervicalgia  ONSET DATE: September 2023  SUBJECTIVE:                                                                                                                                                                                            SUBJECTIVE STATEMENT: No falls, a little less pain overall  PERTINENT HISTORY:  See above  PAIN:  Are you having pain? Yes: NPRS scale: 5/10 Pain location: knees Pain description: ache, sore Aggravating factors: yardwork, hosuework pain up to 8/10 Relieving factors: rest, pain can be 0/10 at times   PRECAUTIONS: None  WEIGHT BEARING RESTRICTIONS: No  FALLS:  Has patient fallen in last 6 months? No  LIVING ENVIRONMENT: Lives with: lives with their family and lives alone Lives in: House/apartment Stairs: No Has following equipment at home: Single point cane  OCCUPATION: retired  PLOF: Independent able to do yardwork and housework  PATIENT GOALS: walk better, less pain   OBJECTIVE:   DIAGNOSTIC FINDINGS:  osteoporosis  PATIENT SURVEYS:  FOTO 31  COGNITION:  Overall cognitive status: Within functional limits for tasks assessed     SENSATION: WFL  MUSCLE LENGTH: Tight calves, HS and piriformis  POSTURE: rounded shoulders, forward head, and decreased lumbar lordosis  PALPATION: She is tight and tender in the lumbar area and into the thoracic and upper trap area, mild tenderness in the buttocks  LUMBAR ROM:   AROM eval  Flexion Decreased 50%  Extension Decreased 100% with pain  Right lateral flexion   Left lateral flexion   Right rotation   Left rotation    (Blank rows = not tested)  LOWER EXTREMITY ROM:     Active  Right eval Left eval  Hip flexion    Hip extension    Hip abduction    Hip adduction    Hip internal rotation    Hip external rotation    Knee flexion    Knee extension  Ankle dorsiflexion    Ankle plantarflexion    Ankle inversion    Ankle eversion     (Blank rows = not tested)  LOWER EXTREMITY MMT:    MMT Right eval Left eval  Hip flexion 3+ 3+  Hip  extension    Hip abduction 3+ 3+  Hip adduction    Hip internal rotation    Hip external rotation    Knee flexion 3+ with pain 3+  Knee extension 3+ with pain 3+  Ankle dorsiflexion 4- 4-  Ankle plantarflexion    Ankle inversion    Ankle eversion     (Blank rows = not tested)  FUNCTIONAL TESTS:  5 times sit to stand: 31 seconds: 10/01/22 30 seconds Timed up and go (TUG): 26 seconds with [facility]   07/25/22 TUG 22 seconds with SPC, 08/20/22 18 seconds with SPC, 10/01/22 23 seconds without device  GAIT: Distance walked: 60 feet Assistive device utilized: Single point cane Level of assistance: SBA Comments: slow, slightly stooped    TODAY'S TREATMENT:  [facility] Adult PT Treatment:                                                                                                                             10/17/22 Nustep level 5 x 7 minutes Gait outside around the parking [place], some curb negotiating and some walking in the grass LAQ 2.5# Marches 2.5# Red tband HS curls 2.5# hip abduction in standing 2.5# hip extension Ball b/n knees squeeze Red tband rows and extensions  10/15/22 Nustep level 5 x 6 minutes Gait outside around the parking lot SBA Seated marches and LAQ2.5# Seated hip abduction Toe taps and heel raises Standing hip abduction and extension  10/08/22 Nustep level 5 x 8 minutes Red tband HS curls Calf stretches with assist Marches and LAQ without weight Toe and heel raises in sitting She does have some palpable and visible swelling in th eright medial kne Red tband HS curls Red tband hip abduction Seated red tband rows and extensions Ball in lap isometrics  10/01/22 Nustep level 5 x 8 minutes TUG 23 seconds without device 5XSTS 30 seconds 2.5# LAQ 2.5# marches Seated heel and toe raises Side stepping, backward walking Seated pelvic mobility STM to the upper traps and rhomboids and into the lumbar area  09/05/22 Nustep level 4 x 7 minutes Seated  LAQ MArches Pball rollouts 3 ways Side bend elbow touches Toe taps/heel raises STM to the back and upper traps, no bruising but tender has a spot on the elbow with a band aid on reports that it is a blister from her trying to army crawl to the couch MHP/IFC in sitting to the low back  PATIENT EDUCATION:  Education details: POC Person educated: Patient Education method: Explanation Education comprehension: verbalized understanding   HOME EXERCISE PROGRAM: TBD  ASSESSMENT:  CLINICAL IMPRESSION: I ramped the exercises back up after her eye surgeries and she was not able to get  an appointment next week.  She tolerated well, the walking outside goes well, on the grass she goes very slow due to some fear and when she fatigues she gets very slow.  We talked about what she could do at home on her own and she reports that she would have difficulty doing thigns but I tried to show her seated exercises that she could do while a commercial comes on the TV  OBJECTIVE IMPAIRMENTS: Abnormal gait, cardiopulmonary status limiting activity, decreased activity tolerance, decreased balance, decreased endurance, decreased mobility, difficulty walking, decreased ROM, decreased strength, increased muscle spasms, impaired flexibility, postural dysfunction, and pain.    REHAB POTENTIAL: Good  CLINICAL DECISION MAKING: Stable/uncomplicated  EVALUATION COMPLEXITY: Low   GOALS: Goals reviewed with patient? Yes  SHORT TERM GOALS: Target date: 06/24/22  Independent with initial HEP Goal status: met  LONG TERM GOALS: Target date: 09/02/22  Decrease pain 50% Goal status: progressing 10/01/22  2.  Independent with an advanced HEP  Goal status:ongoing 10/01/22  3.  Decrease TUG time to 13 seconds Goal status: progressing 10/01/22  4.  Increase LE strength to 4/5 Goal status: progressing 10/01/22  5.  Walk 10 minutes with SPC and without rest Goal status:  progressing 10/01/22  PLAN: PT FREQUENCY:  1-2x/week  PT DURATION: 12 weeks  PLANNED INTERVENTIONS: Therapeutic exercises, Therapeutic activity, Neuromuscular re-education, Balance training, Gait training, Patient/Family education, Self Care, Joint mobilization, Stair training, Dry Needling, Electrical stimulation, Moist heat, and Manual therapy.  PLAN FOR NEXT SESSION:  Work on fall prevention and [employer], PT 10/17/2022, 12:51 PM

## 2022-10-23 ENCOUNTER — Encounter: Payer: Self-pay | Admitting: Family Medicine

## 2022-10-23 ENCOUNTER — Ambulatory Visit (INDEPENDENT_AMBULATORY_CARE_PROVIDER_SITE_OTHER): Payer: Medicare Other | Admitting: Family Medicine

## 2022-10-23 ENCOUNTER — Ambulatory Visit (INDEPENDENT_AMBULATORY_CARE_PROVIDER_SITE_OTHER)
Admission: RE | Admit: 2022-10-23 | Discharge: 2022-10-23 | Disposition: A | Discharge status: Home / Self Care | Payer: Medicare Other | Source: Ambulatory Visit | Attending: Family Medicine | Admitting: Family Medicine

## 2022-10-23 VITALS — BP 133/80 | HR 72 | Temp 97.8°F | Ht 62.0 in | Wt 167.5 lb

## 2022-10-23 DIAGNOSIS — M25511 Pain in right shoulder: Secondary | ICD-10-CM

## 2022-10-23 DIAGNOSIS — T148XXA Other injury of unspecified body region, initial encounter: Secondary | ICD-10-CM | POA: Diagnosis not present

## 2022-10-23 NOTE — Patient Instructions (Addendum)
Tylenol is ok ok for pain  Try a cold compress on your shoulder (if you can tolerate it) for 10 minutes at a time to help inflammation   Try voltaren gel over the counter up to four times daily   Watch the bruise on your arm - let me know if this does not improve   Xray now  We will call with result and make a plan   Do some passive range of motion for the shoulder to keep it from getting stiff

## 2022-10-23 NOTE — Progress Notes (Signed)
Subjective:    Patient ID: Linda Mcgrath, female    DOB: 1933-03-12, 89 y.o.   MRN: [id number]  HPI Pt presents for R arm and shoulder pain   Wt Readings from Last 3 Encounters:  10/23/22 167 lb 8 oz (76 kg)  10/03/22 170 lb (77.1 kg)  09/30/22 165 lb (74.8 kg)  Vitals  30.64 kg/m  . R shoulder pain  Can be intense Sharp in nature  Started 1 wk ago  No trauma   Is R handed  Has to do everything herself  Takes care of dogs    Worse to reach above her head  To lift   Last fall 2 mo ago No injury at all    Used a little heat Cold makes it worse  Takes some tylenol  Has not used topicals   DG Shoulder Right  Result Date: 10/23/2022 CLINICAL DATA:  Acute right shoulder pain. EXAM: RIGHT SHOULDER - 2+ VIEW COMPARISON:  None Available. FINDINGS: There is no evidence of fracture or dislocation. Severe narrowing of subacromial space is noted suggesting chronic rotator cuff injury. Soft tissues are unremarkable. IMPRESSION: Severe narrowing of subacromial space is noted suggesting chronic rotator cuff injury. No acute fracture or dislocation is noted. Electronically Signed   By: Lupita Raider M.D.   On: 10/23/2022 15:03      Gets up to urinate at least 3 times per night  Has to use pads   Patient Active Problem List   Diagnosis Date Noted   Right shoulder pain 10/23/2022   Acute cystitis without hematuria 03/26/2022   Vaginal itching 03/26/2022   Urinary frequency 03/26/2022   Vaginal candida 03/26/2022   Chronic upper back pain 08/30/2020   Aortic atherosclerosis (HCC) 05/31/2020   Abnormal CT scan, small bowel 05/18/2020   Insomnia 12/15/2019   Chronic low back pain 10/18/2019   Osteoarthritis of right knee 08/02/2019   S/P lumbar spinal fusion 07/31/2017   H/O atrial flutter 03/06/2016   Obesity 11/06/2015   Urge incontinence 01/24/2015   Estrogen deficiency 10/25/2014   Lichen planus 12/20/2013   Prediabetes 06/03/2011   HYPERTENSION,  BENIGN ESSENTIAL 10/23/2010   OSTEOARTHRITIS, HANDS, BILATERAL 01/24/2010   Hypothyroidism 08/24/2008   Vitamin D deficiency 06/06/2008   Hyperlipidemia 06/06/2008   MENOPAUSAL SYNDROME 03/29/2008   Osteoporosis 03/29/2008   Allergic rhinitis 01/25/2008   IBS 01/25/2008   OSTEOARTHRITIS 01/25/2008   FIBROMYALGIA 01/25/2008   Past Medical History:  Diagnosis Date   Allergic rhinitis    Arthritis    Asthma    Cataract    Bil/lens implant   Colon polyp 1999   small polyp   Diverticulosis    Dysrhythmia    Fibromyalgia    Hyperlipidemia    Hypothyroidism    Internal hemorrhoids    Leg cramps    Lichen planus    Lung nodule    stable LUL 9 mm   Menopausal syndrome    Osteoarthritis    UTI (urinary tract infection)    Past Surgical History:  Procedure Laterality Date   Abd U/S  11/1998   negative   Abd U/S  01/2001   gallbladder polyps   ABDOMINAL HYSTERECTOMY  1975   total-fibroids   APPENDECTOMY  1975   BREAST BIOPSY     benign   BREAST EXCISIONAL BIOPSY Left 1957   CARDIOVERSION N/A 03/07/2016   Procedure: CARDIOVERSION;  Surgeon: Yates Decamp, MD;  Location: [facility] ENDOSCOPY;  Service: Cardiovascular;  Laterality: N/A;  CATARACT EXTRACTION     bilateral   CHOLECYSTECTOMY     COLONOSCOPY  1998   Diverticulosis; polyp\   COLONOSCOPY  09/2002   Diverticulosis, hem   COLONOSCOPY  12/2007   diverticulosis, polyp   DEXA  10/2001   osteopenia   ELECTROPHYSIOLOGIC STUDY N/A 04/03/2016   Procedure: A-Flutter Ablation;  Surgeon: Will [name], MD;  Location: [facility] INVASIVE CV LAB;  Service: Cardiovascular;  Laterality: N/A;   ESOPHAGOGASTRODUODENOSCOPY  2004   Hida scan  01/2001   Negative   KNEE SURGERY     right knee / 11/2004   LAMINECTOMY WITH POSTERIOR LATERAL ARTHRODESIS LEVEL 2 N/A 07/31/2017   Procedure: DECOMPRESSIVE LAMINECTOMY LUMABR FOUR-FIVE, LUMBAR FIVE-SACRAL ONE;  Surgeon: Tia Alert, MD;  Location: [facility] OR;  Service: Neurosurgery;  Laterality:  N/A;   laser surgery for glaucoma Left 09/21/2014   MULTIPLE TOOTH EXTRACTIONS     rentinal tear     ROTATOR CUFF REPAIR     x 2 /right shoulder   SKIN CANCER EXCISION     pre-melanoma / on face   TEE WITHOUT CARDIOVERSION N/A 03/07/2016   Procedure: TRANSESOPHAGEAL ECHOCARDIOGRAM (TEE);  Surgeon: Yates Decamp, MD;  Location: [facility] ENDOSCOPY;  Service: Cardiovascular;  Laterality: N/A;   TOE SURGERY     rt foot    Social History   Tobacco Use   Smoking status: Never   Smokeless tobacco: Never  Vaping Use   Vaping Use: Never used  Substance Use Topics   Alcohol use: No    Alcohol/week: 0.0 standard drinks of alcohol    Comment: occasional-rare   Drug use: No   Family History  Problem Relation Age of Onset   Parkinsonism Father    Colon cancer Brother    Allergies Mother    Heart disease Mother    Kidney failure Son        congenital  (kidney transplant)    Heart failure Son        died suddenly of CHF    Allergies  Allergen Reactions   Shellfish Allergy Anaphylaxis and Nausea And Vomiting   Tetracycline Hives, Nausea And Vomiting, Rash and Other (See Comments)    SYSTEMIC REACTION: heart palpitations,muscle spasms, vomiting   Amlodipine Besylate Other (See Comments)    REACTION: muscle spasms, extremity swelling, low bp   Ciprofloxacin Other (See Comments)    REACTION: muscle spasms, insomnia   Codeine Other (See Comments)    REACTION: hallucinations   Propoxyphene Hcl Other (See Comments)    Unknown   Sulfamethoxazole-Trimethoprim Nausea Only and Other (See Comments)    REACTION: dizzy, could not focus eyes and could not concentrate and nausea   Tape Other (See Comments)    BANDAIDS TAKE OFF HER SKIN; PLEASE USE COBAN OR PAPER    Xarelto [Rivaroxaban] Other (See Comments)    Aches and pains and nervousness   Amoxicillin Rash   Other Swelling, Rash and Other (See Comments)    Has autoimmune disorder and spices erode her salivary glands and affects ankles and mouth  DIRECTLY (takes off 1st layer of skin and leaves her unable to walk)   Penicillins Swelling, Rash and Other (See Comments)    Has patient had a PCN reaction causing immediate rash, facial/tongue/throat swelling, SOB or lightheadedness with hypotension: Yes Has patient had a PCN reaction causing severe rash involving mucus membranes or skin necrosis: No Has patient had a PCN reaction that required hospitalization No Has patient had a PCN reaction occurring  within the last 10 years: No If all of the above answers are "NO", then may proceed with Cephalosporin use.    Current Outpatient Medications on File Prior to Visit  Medication Sig Dispense Refill   alendronate (FOSAMAX) 70 MG tablet Take 1 tablet (70 mg total) by mouth every 7 (seven) days. Take with a full glass of water on an empty stomach. 4 tablet 11   Ascorbic Acid (VITAMIN C PO) Take 500 mg by mouth daily.     B Complex Vitamins (VITAMIN B COMPLEX PO) Take 1 tablet by mouth daily.     B COMPLEX, FOLIC ACID, PO Take 666 mcg by mouth daily.     Biotin 10 MG TABS Take 1 tablet by mouth every morning.      Calcium Citrate-Vitamin D 250-200 MG-UNIT TABS Take 1 tablet by mouth daily.     cholestyramine (QUESTRAN) 4 g packet Take 1 packet (4 g total) by mouth daily. MIX AND DRINK 1 PACKET BY MOUTH DAILY 90 each 3   Cranberry 180 MG CAPS Take 2 capsules by mouth daily.     dextromethorphan-guaiFENesin (MUCINEX DM) 30-600 MG 12hr tablet Take 1 tablet by mouth 2 (two) times daily. 15 tablet 0   Fexofenadine HCl (MUCINEX ALLERGY PO) Take 1 tablet by mouth daily as needed (Allergies).      fluconazole (DIFLUCAN) 150 MG tablet Take one pill by mouth today and repeat dose in 3 days 2 tablet 0   fluticasone (FLONASE) 50 MCG/ACT nasal spray Place 1 spray into both nostrils daily for 3 days. 16 g 0   folic acid (FOLVITE) 1 MG tablet Take 1 mg by mouth daily.     levothyroxine (SYNTHROID) 88 MCG tablet Take 1 tablet (88 mcg total) by mouth daily before  breakfast. 90 tablet 2   loratadine (CLARITIN) 10 MG tablet Take 10 mg by mouth daily as needed for allergies.      MAGNESIUM-OXIDE PO Take 1 tablet by mouth daily.     Methenamine-Sodium Salicylate (AZO URINARY TRACT DEFENSE PO) Take 1 capsule by mouth daily.     Multiple Vitamin (MULTIVITAMIN) capsule Take 1 capsule by mouth daily.     Psyllium (METAMUCIL FIBER PO) Take by mouth at bedtime. 2 tablespoon     Pumpkin Seed-Soy Germ (AZO BLADDER CONTROL/GO-LESS PO) Take 1 tablet by mouth 2 (two) times a week.     solifenacin (VESICARE) 5 MG tablet TAKE 1 TABLET(5 MG) BY MOUTH DAILY 30 tablet 11   TURMERIC PO Take 1 capsule by mouth 2 (two) times daily.      vitamin B-12 (CYANOCOBALAMIN) 500 MCG tablet Take 500 mcg by mouth daily.     Vitamin D, Cholecalciferol, 50 MCG (2000 UT) CAPS Take 2 tablets by mouth daily.     No current facility-administered medications on file prior to visit.     Review of Systems  Constitutional:  Negative for activity change, appetite change, fatigue, fever and unexpected weight change.  HENT:  Negative for congestion, ear pain, rhinorrhea, sinus pressure and sore throat.   Eyes:  Negative for pain, redness and visual disturbance.  Respiratory:  Negative for cough, shortness of breath and wheezing.   Cardiovascular:  Negative for chest pain and palpitations.  Gastrointestinal:  Negative for abdominal pain, blood in stool, constipation and diarrhea.  Endocrine: Negative for polydipsia and polyuria.  Genitourinary:  Negative for dysuria, frequency and urgency.  Musculoskeletal:  Negative for arthralgias, back pain and myalgias.  Right shoulder pain   Skin:  Negative for pallor and rash.  Allergic/Immunologic: Negative for environmental allergies.  Neurological:  Negative for dizziness, syncope and headaches.  Hematological:  Negative for adenopathy. Does not bruise/bleed easily.  Psychiatric/Behavioral:  Negative for decreased concentration and dysphoric  mood. The patient is not nervous/anxious.        Objective:   Physical Exam Constitutional:      General: She is not in acute distress.    Appearance: Normal appearance. She is obese. She is not ill-appearing.  HENT:     Mouth/Throat:     Mouth: Mucous membranes are moist.  Eyes:     General: No scleral icterus.    Conjunctiva/sclera: Conjunctivae normal.     Pupils: Pupils are equal, round, and reactive to light.  Cardiovascular:     Rate and Rhythm: Normal rate and regular rhythm.  Pulmonary:     Effort: Pulmonary effort is normal. No respiratory distress.     Breath sounds: Normal breath sounds. No wheezing or rales.  Chest:     Chest wall: No tenderness.  Musculoskeletal:     Right shoulder: Swelling, tenderness and bony tenderness present. No deformity, effusion or crepitus. Decreased range of motion. Normal strength. Normal pulse.     Cervical back: Neck supple. No rigidity or tenderness.     Comments: Right shoulder No deformity/swelling/warmth  Hematoma on R bicep area-erythema and mild swelling (no double bubble sign) No crepitus  No obvious effusion  Abduction - almost 90 deg, with pain  Hawking test -pos Neer test -pos Internal rotation painful External rotation less painful than int rot Tenderness acromion area , slt over bicep  Normal grip and hand dexterity     Lymphadenopathy:     Cervical: No cervical adenopathy.  Skin:    Coloration: Skin is not pale.     Findings: Bruising present. No rash.  Neurological:     Mental Status: She is alert.     Sensory: No sensory deficit.     Motor: No weakness.     Deep Tendon Reflexes: Reflexes normal.  Psychiatric:        Mood and Affect: Mood normal.           Assessment & Plan:   Problem List Items Addressed This Visit       Other   Hematoma    Pt has a swollen erythematous area on R bicep consistent with hematoma She says it is from grabbing this arm with her L hand when it hurt while raising it   Minimal tenderness Enc use of ice as needed  Update if not starting to improve in a week or if worsening        Right shoulder pain - Primary    First noticed a week ago reaching above her head (no trauma but I do wonder if she inj it then)  - pain was bad enough to grab arm with other hand and cause a bruise  Suspect rotator cuff injury but OA is possible also  Xray today noted severe narrowing of sub acromial space (suggesting rot cuff inj)  Disc use of ice if tolerated Also voltaren gel up to qod prn  Tylenol prn  Will ref to orthopedics  Avoid activities that hurt  Given handout with passive rom exercises to prevent frozen shoulder       Relevant Orders   DG Shoulder Right (Completed)

## 2022-10-23 NOTE — Assessment & Plan Note (Addendum)
First noticed a week ago reaching above her head (no trauma but I do wonder if she inj it then)  - pain was bad enough to grab arm with other hand and cause a bruise  Suspect rotator cuff injury but OA is possible also  Xray today noted severe narrowing of sub acromial space (suggesting rot cuff inj)  Disc use of ice if tolerated Also voltaren gel up to qod prn  Tylenol prn  Will ref to orthopedics  Avoid activities that hurt  Given handout with passive rom exercises to prevent frozen shoulder

## 2022-10-23 NOTE — Assessment & Plan Note (Signed)
Pt has a swollen erythematous area on R bicep consistent with hematoma She says it is from grabbing this arm with her L hand when it hurt while raising it  Minimal tenderness Enc use of ice as needed  Update if not starting to improve in a week or if worsening

## 2022-10-24 NOTE — Addendum Note (Signed)
Addended by: Loura Pardon A on: 10/24/2022 05:32 PM   Modules accepted: Orders

## 2022-10-28 ENCOUNTER — Ambulatory Visit: Payer: Medicare Other | Attending: Family Medicine | Admitting: Physical Therapy

## 2022-10-28 ENCOUNTER — Encounter: Payer: Self-pay | Admitting: Physical Therapy

## 2022-10-28 DIAGNOSIS — M62830 Muscle spasm of back: Secondary | ICD-10-CM

## 2022-10-28 DIAGNOSIS — R262 Difficulty in walking, not elsewhere classified: Secondary | ICD-10-CM | POA: Diagnosis not present

## 2022-10-28 DIAGNOSIS — M542 Cervicalgia: Secondary | ICD-10-CM | POA: Diagnosis not present

## 2022-10-28 DIAGNOSIS — M5459 Other low back pain: Secondary | ICD-10-CM | POA: Diagnosis not present

## 2022-10-28 NOTE — Therapy (Signed)
OUTPATIENT PHYSICAL THERAPY THORACOLUMBAR TREATMENT    Patient Name: Linda Mcgrath MRN: [id number] DOB:06-11-1933, 89 y.o., female Today's Date: 10/28/2022   PT End of Session - 10/28/22 1635     Visit Number 25    Date for PT Re-Evaluation 10/30/22    Authorization Type [employer]    PT Start Time 1613    PT Stop Time 1700    PT Time Calculation (min) 47 min    Activity Tolerance Patient tolerated treatment well;Patient limited by pain    Behavior During Therapy [facility] for tasks assessed/performed             Past Medical History:  Diagnosis Date   Allergic rhinitis    Arthritis    Asthma    Cataract    Bil/lens implant   Colon polyp 1999   small polyp   Diverticulosis    Dysrhythmia    Fibromyalgia    Hyperlipidemia    Hypothyroidism    Internal hemorrhoids    Leg cramps    Lichen planus    Lung nodule    stable LUL 9 mm   Menopausal syndrome    Osteoarthritis    UTI (urinary tract infection)    Past Surgical History:  Procedure Laterality Date   Abd U/S  11/1998   negative   Abd U/S  01/2001   gallbladder polyps   ABDOMINAL HYSTERECTOMY  1975   total-fibroids   APPENDECTOMY  1975   BREAST BIOPSY     benign   BREAST EXCISIONAL BIOPSY Left 1957   CARDIOVERSION N/A 03/07/2016   Procedure: CARDIOVERSION;  Surgeon: Yates Decamp, MD;  Location: [facility] ENDOSCOPY;  Service: Cardiovascular;  Laterality: N/A;   CATARACT EXTRACTION     bilateral   CHOLECYSTECTOMY     COLONOSCOPY  1998   Diverticulosis; polyp\   COLONOSCOPY  09/2002   Diverticulosis, hem   COLONOSCOPY  12/2007   diverticulosis, polyp   DEXA  10/2001   osteopenia   ELECTROPHYSIOLOGIC STUDY N/A 04/03/2016   Procedure: A-Flutter Ablation;  Surgeon: Will [name], MD;  Location: [facility] INVASIVE CV LAB;  Service: Cardiovascular;  Laterality: N/A;   ESOPHAGOGASTRODUODENOSCOPY  2004   Hida scan  01/2001   Negative   KNEE SURGERY     right knee / 11/2004   LAMINECTOMY WITH POSTERIOR  LATERAL ARTHRODESIS LEVEL 2 N/A 07/31/2017   Procedure: DECOMPRESSIVE LAMINECTOMY LUMABR FOUR-FIVE, LUMBAR FIVE-SACRAL ONE;  Surgeon: Tia Alert, MD;  Location: [facility] OR;  Service: Neurosurgery;  Laterality: N/A;   laser surgery for glaucoma Left 09/21/2014   MULTIPLE TOOTH EXTRACTIONS     rentinal tear     ROTATOR CUFF REPAIR     x 2 /right shoulder   SKIN CANCER EXCISION     pre-melanoma / on face   TEE WITHOUT CARDIOVERSION N/A 03/07/2016   Procedure: TRANSESOPHAGEAL ECHOCARDIOGRAM (TEE);  Surgeon: Yates Decamp, MD;  Location: [facility] ENDOSCOPY;  Service: Cardiovascular;  Laterality: N/A;   TOE SURGERY     rt foot    Patient Active Problem List   Diagnosis Date Noted   Right shoulder pain 10/23/2022   Hematoma 10/23/2022   Acute cystitis without hematuria 03/26/2022   Vaginal itching 03/26/2022   Urinary frequency 03/26/2022   Vaginal candida 03/26/2022   Chronic upper back pain 08/30/2020   Aortic atherosclerosis (HCC) 05/31/2020   Abnormal CT scan, small bowel 05/18/2020   Insomnia 12/15/2019   Chronic low back pain 10/18/2019   Osteoarthritis of right knee 08/02/2019  S/P lumbar spinal fusion 07/31/2017   H/O atrial flutter 03/06/2016   Obesity 11/06/2015   Urge incontinence 01/24/2015   Estrogen deficiency 10/25/2014   Lichen planus 12/20/2013   Prediabetes 06/03/2011   HYPERTENSION, BENIGN ESSENTIAL 10/23/2010   OSTEOARTHRITIS, HANDS, BILATERAL 01/24/2010   Hypothyroidism 08/24/2008   Vitamin D deficiency 06/06/2008   Hyperlipidemia 06/06/2008   MENOPAUSAL SYNDROME 03/29/2008   Osteoporosis 03/29/2008   Allergic rhinitis 01/25/2008   IBS 01/25/2008   OSTEOARTHRITIS 01/25/2008   FIBROMYALGIA 01/25/2008    PCP: [employer]  REFERRING PROVIDER: [name]  REFERRING DIAG: Back pain, difficulty walking, osteoporosis  Rationale for Evaluation and Treatment Rehabilitation  THERAPY DIAG:  Other low back pain  Muscle spasm of back  Difficulty in walking, not elsewhere  classified  Cervicalgia  ONSET DATE: September 2023  SUBJECTIVE:                                                                                                                                                                                           SUBJECTIVE STATEMENT: Patient comes in and is moving slower, she reports that she is having shoulder problems, she reports about 2 weeks ago she was reaching in to a cabinet and felt sharp right shoulder pain, sh noticed a large bruise develop over the next few days.  She went to the MD and has been referred to orthopedic MD.  PERTINENT HISTORY:  See above  PAIN:  Are you having pain? Yes: NPRS scale: 5/10 Pain location: knees Pain description: ache, sore Aggravating factors: yardwork, hosuework pain up to 8/10 Relieving factors: rest, pain can be 0/10 at times   PRECAUTIONS: None  WEIGHT BEARING RESTRICTIONS: No  FALLS:  Has patient fallen in last 6 months? No  LIVING ENVIRONMENT: Lives with: lives with their family and lives alone Lives in: House/apartment Stairs: No Has following equipment at home: Single point cane  OCCUPATION: retired  PLOF: Independent able to do yardwork and housework  PATIENT GOALS: walk better, less pain   OBJECTIVE:   DIAGNOSTIC FINDINGS:  osteoporosis  PATIENT SURVEYS:  FOTO 31  COGNITION:  Overall cognitive status: Within functional limits for tasks assessed     SENSATION: WFL  MUSCLE LENGTH: Tight calves, HS and piriformis  POSTURE: rounded shoulders, forward head, and decreased lumbar lordosis  PALPATION: She is tight and tender in the lumbar area and into the thoracic and upper trap area, mild tenderness in the buttocks  LUMBAR ROM:   AROM eval  Flexion Decreased 50%  Extension Decreased 100% with pain  Right lateral flexion   Left lateral flexion   Right rotation   Left rotation    (  Blank rows = not tested)  LOWER EXTREMITY ROM:     Active  Right eval  Left eval  Hip flexion    Hip extension    Hip abduction    Hip adduction    Hip internal rotation    Hip external rotation    Knee flexion    Knee extension    Ankle dorsiflexion    Ankle plantarflexion    Ankle inversion    Ankle eversion     (Blank rows = not tested)  LOWER EXTREMITY MMT:    MMT Right eval Left eval  Hip flexion 3+ 3+  Hip extension    Hip abduction 3+ 3+  Hip adduction    Hip internal rotation    Hip external rotation    Knee flexion 3+ with pain 3+  Knee extension 3+ with pain 3+  Ankle dorsiflexion 4- 4-  Ankle plantarflexion    Ankle inversion    Ankle eversion     (Blank rows = not tested)  FUNCTIONAL TESTS:  5 times sit to stand: 31 seconds: 10/01/22 30 seconds Timed up and go (TUG): 26 seconds with [facility]   07/25/22 TUG 22 seconds with SPC, 08/20/22 18 seconds with SPC, 10/01/22 23 seconds without device  GAIT: Distance walked: 60 feet Assistive device utilized: Single point cane Level of assistance: SBA Comments: slow, slightly stooped    TODAY'S TREATMENT:  [facility] Adult PT Treatment:                                                                                                                             10/28/22 Nustep level 3 LE only Seated LAQ 2# Marches 2# Toe and heel raises Estim ice to the right shoulder She has ecchymosis in the right lateral and anterior shoulder, mild tenderness, biggest issue is raising the arm up and out is very painful  10/17/22 Nustep level 5 x 7 minutes Gait outside around the parking [place], some curb negotiating and some walking in the grass LAQ 2.5# Marches 2.5# Red tband HS curls 2.5# hip abduction in standing 2.5# hip extension Ball b/n knees squeeze Red tband rows and extensions  10/15/22 Nustep level 5 x 6 minutes Gait outside around the parking lot SBA Seated marches and LAQ2.5# Seated hip abduction Toe taps and heel raises Standing hip abduction and extension  10/08/22 Nustep level 5  x 8 minutes Red tband HS curls Calf stretches with assist Marches and LAQ without weight Toe and heel raises in sitting She does have some palpable and visible swelling in th eright medial kne Red tband HS curls Red tband hip abduction Seated red tband rows and extensions Ball in lap isometrics  10/01/22 Nustep level 5 x 8 minutes TUG 23 seconds without device 5XSTS 30 seconds 2.5# LAQ 2.5# marches Seated heel and toe raises Side stepping, backward walking Seated pelvic mobility STM to the upper traps and rhomboids and into the lumbar area  09/05/22 Nustep  level 4 x 7 minutes Seated LAQ MArches Pball rollouts 3 ways Side bend elbow touches Toe taps/heel raises STM to the back and upper traps, no bruising but tender has a spot on the elbow with a band aid on reports that it is a blister from her trying to army crawl to the couch MHP/IFC in sitting to the low back  PATIENT EDUCATION:  Education details: POC Person educated: Patient Education method: Explanation Education comprehension: verbalized understanding   HOME EXERCISE PROGRAM: TBD  ASSESSMENT:  CLINICAL IMPRESSION: Patient was out a few weeks with the eye surgeries, she comes in today with c/o right shoulder /arm pain, she reports reaching into cabinet and having significant pain, she reports that over a few days a bruise developed, she saw Dr. Milinda Antis and she reports that she has been referred to an orthopedic MD.  That will happen next week she thinks.  I put ice and estim on for pain and anti inflammation OBJECTIVE IMPAIRMENTS: Abnormal gait, cardiopulmonary status limiting activity, decreased activity tolerance, decreased balance, decreased endurance, decreased mobility, difficulty walking, decreased ROM, decreased strength, increased muscle spasms, impaired flexibility, postural dysfunction, and pain.    REHAB POTENTIAL: Good  CLINICAL DECISION MAKING: Stable/uncomplicated  EVALUATION COMPLEXITY:  Low   GOALS: Goals reviewed with patient? Yes  SHORT TERM GOALS: Target date: 06/24/22  Independent with initial HEP Goal status: met  LONG TERM GOALS: Target date: 09/02/22  Decrease pain 50% Goal status: progressing 10/01/22  2.  Independent with an advanced HEP  Goal status:ongoing 10/01/22  3.  Decrease TUG time to 13 seconds Goal status: progressing 10/01/22  4.  Increase LE strength to 4/5 Goal status: progressing 10/01/22  5.  Walk 10 minutes with SPC and without rest Goal status:  progressing 10/01/22  PLAN: PT FREQUENCY: 1-2x/week  PT DURATION: 12 weeks  PLANNED INTERVENTIONS: Therapeutic exercises, Therapeutic activity, Neuromuscular re-education, Balance training, Gait training, Patient/Family education, Self Care, Joint mobilization, Stair training, Dry Needling, Electrical stimulation, Moist heat, and Manual therapy.  PLAN FOR NEXT SESSION:  I told her if this helps we can do again prior to her going to the orthopod but if not she can [place] cancel our appointments Jearld Lesch, PT 10/28/2022, 4:35 PM

## 2022-10-30 ENCOUNTER — Encounter: Payer: Self-pay | Admitting: Physical Therapy

## 2022-11-05 ENCOUNTER — Encounter: Payer: Self-pay | Admitting: Physical Therapy

## 2022-11-05 ENCOUNTER — Ambulatory Visit: Payer: Medicare Other | Admitting: Physical Therapy

## 2022-11-05 DIAGNOSIS — R262 Difficulty in walking, not elsewhere classified: Secondary | ICD-10-CM

## 2022-11-05 DIAGNOSIS — M62830 Muscle spasm of back: Secondary | ICD-10-CM | POA: Diagnosis not present

## 2022-11-05 DIAGNOSIS — M5459 Other low back pain: Secondary | ICD-10-CM

## 2022-11-05 DIAGNOSIS — M542 Cervicalgia: Secondary | ICD-10-CM

## 2022-11-05 NOTE — Therapy (Signed)
OUTPATIENT PHYSICAL THERAPY THORACOLUMBAR TREATMENT    Patient Name: Linda Mcgrath MRN: [id number] DOB:20-Aug-1933, 89 y.o., female Today's Date: 11/05/2022   PT End of Session - 11/05/22 1545     Visit Number 25    Date for PT Re-Evaluation 12/06/22    Authorization Type [employer]    PT Start Time 1520    PT Stop Time 1600    PT Time Calculation (min) 40 min    Activity Tolerance Patient tolerated treatment well;Patient limited by pain    Behavior During Therapy [facility] for tasks assessed/performed             Past Medical History:  Diagnosis Date   Allergic rhinitis    Arthritis    Asthma    Cataract    Bil/lens implant   Colon polyp 1999   small polyp   Diverticulosis    Dysrhythmia    Fibromyalgia    Hyperlipidemia    Hypothyroidism    Internal hemorrhoids    Leg cramps    Lichen planus    Lung nodule    stable LUL 9 mm   Menopausal syndrome    Osteoarthritis    UTI (urinary tract infection)    Past Surgical History:  Procedure Laterality Date   Abd U/S  11/1998   negative   Abd U/S  01/2001   gallbladder polyps   ABDOMINAL HYSTERECTOMY  1975   total-fibroids   APPENDECTOMY  1975   BREAST BIOPSY     benign   BREAST EXCISIONAL BIOPSY Left 1957   CARDIOVERSION N/A 03/07/2016   Procedure: CARDIOVERSION;  Surgeon: Yates Decamp, MD;  Location: [facility] ENDOSCOPY;  Service: Cardiovascular;  Laterality: N/A;   CATARACT EXTRACTION     bilateral   CHOLECYSTECTOMY     COLONOSCOPY  1998   Diverticulosis; polyp\   COLONOSCOPY  09/2002   Diverticulosis, hem   COLONOSCOPY  12/2007   diverticulosis, polyp   DEXA  10/2001   osteopenia   ELECTROPHYSIOLOGIC STUDY N/A 04/03/2016   Procedure: A-Flutter Ablation;  Surgeon: Will [name], MD;  Location: [facility] INVASIVE CV LAB;  Service: Cardiovascular;  Laterality: N/A;   ESOPHAGOGASTRODUODENOSCOPY  2004   Hida scan  01/2001   Negative   KNEE SURGERY     right knee / 11/2004   LAMINECTOMY WITH POSTERIOR  LATERAL ARTHRODESIS LEVEL 2 N/A 07/31/2017   Procedure: DECOMPRESSIVE LAMINECTOMY LUMABR FOUR-FIVE, LUMBAR FIVE-SACRAL ONE;  Surgeon: Tia Alert, MD;  Location: [facility] OR;  Service: Neurosurgery;  Laterality: N/A;   laser surgery for glaucoma Left 09/21/2014   MULTIPLE TOOTH EXTRACTIONS     rentinal tear     ROTATOR CUFF REPAIR     x 2 /right shoulder   SKIN CANCER EXCISION     pre-melanoma / on face   TEE WITHOUT CARDIOVERSION N/A 03/07/2016   Procedure: TRANSESOPHAGEAL ECHOCARDIOGRAM (TEE);  Surgeon: Yates Decamp, MD;  Location: [facility] ENDOSCOPY;  Service: Cardiovascular;  Laterality: N/A;   TOE SURGERY     rt foot    Patient Active Problem List   Diagnosis Date Noted   Right shoulder pain 10/23/2022   Hematoma 10/23/2022   Acute cystitis without hematuria 03/26/2022   Vaginal itching 03/26/2022   Urinary frequency 03/26/2022   Vaginal candida 03/26/2022   Chronic upper back pain 08/30/2020   Aortic atherosclerosis (HCC) 05/31/2020   Abnormal CT scan, small bowel 05/18/2020   Insomnia 12/15/2019   Chronic low back pain 10/18/2019   Osteoarthritis of right knee 08/02/2019  S/P lumbar spinal fusion 07/31/2017   H/O atrial flutter 03/06/2016   Obesity 11/06/2015   Urge incontinence 01/24/2015   Estrogen deficiency 10/25/2014   Lichen planus 12/20/2013   Prediabetes 06/03/2011   HYPERTENSION, BENIGN ESSENTIAL 10/23/2010   OSTEOARTHRITIS, HANDS, BILATERAL 01/24/2010   Hypothyroidism 08/24/2008   Vitamin D deficiency 06/06/2008   Hyperlipidemia 06/06/2008   MENOPAUSAL SYNDROME 03/29/2008   Osteoporosis 03/29/2008   Allergic rhinitis 01/25/2008   IBS 01/25/2008   OSTEOARTHRITIS 01/25/2008   FIBROMYALGIA 01/25/2008    PCP: [employer]  REFERRING PROVIDER: [name]  REFERRING DIAG: Back pain, difficulty walking, osteoporosis  Rationale for Evaluation and Treatment Rehabilitation  THERAPY DIAG:  Other low back pain  Muscle spasm of back  Difficulty in walking, not elsewhere  classified  Cervicalgia  ONSET DATE: September 2023  SUBJECTIVE:                                                                                                                                                                                           SUBJECTIVE STATEMENT: Patient continues to have right shoulder pain, she is unsure of a cause she denies a fall but reports bruising in the right upper arm, she had an x-ray and has been referred to an orthopod, and she will see that MD tomorrow.  6/10 pain in the right shoulder  PERTINENT HISTORY:  See above  PAIN:  Are you having pain? Yes: NPRS scale: 6/10 Pain location: knees Pain description: ache, sore Aggravating factors: yardwork, hosuework pain up to 8/10 Relieving factors: rest, pain can be 0/10 at times   PRECAUTIONS: None  WEIGHT BEARING RESTRICTIONS: No  FALLS:  Has patient fallen in last 6 months? No  LIVING ENVIRONMENT: Lives with: lives with their family and lives alone Lives in: House/apartment Stairs: No Has following equipment at home: Single point cane  OCCUPATION: retired  PLOF: Independent able to do yardwork and housework  PATIENT GOALS: walk better, less pain   OBJECTIVE:   DIAGNOSTIC FINDINGS:  osteoporosis  PATIENT SURVEYS:  FOTO 31  COGNITION:  Overall cognitive status: Within functional limits for tasks assessed     SENSATION: WFL  MUSCLE LENGTH: Tight calves, HS and piriformis  POSTURE: rounded shoulders, forward head, and decreased lumbar lordosis  PALPATION: She is tight and tender in the lumbar area and into the thoracic and upper trap area, mild tenderness in the buttocks  LUMBAR ROM:   AROM eval  Flexion Decreased 50%  Extension Decreased 100% with pain  Right lateral flexion   Left lateral flexion   Right rotation   Left rotation    (Blank rows = not tested)  LOWER EXTREMITY ROM:     Active  Right eval Left eval  Hip flexion    Hip extension    Hip  abduction    Hip adduction    Hip internal rotation    Hip external rotation    Knee flexion    Knee extension    Ankle dorsiflexion    Ankle plantarflexion    Ankle inversion    Ankle eversion     (Blank rows = not tested)  LOWER EXTREMITY MMT:    MMT Right eval Left eval  Hip flexion 3+ 3+  Hip extension    Hip abduction 3+ 3+  Hip adduction    Hip internal rotation    Hip external rotation    Knee flexion 3+ with pain 3+  Knee extension 3+ with pain 3+  Ankle dorsiflexion 4- 4-  Ankle plantarflexion    Ankle inversion    Ankle eversion     (Blank rows = not tested)  FUNCTIONAL TESTS:  5 times sit to stand: 31 seconds: 10/01/22 30 seconds Timed up and go (TUG): 26 seconds with [facility]   07/25/22 TUG 22 seconds with SPC, 08/20/22 18 seconds with SPC, 10/01/22 23 seconds without device 11/05/22 with SPC 20 seconds  GAIT: Distance walked: 60 feet Assistive device utilized: Single point cane Level of assistance: SBA Comments: slow, slightly stooped    TODAY'S TREATMENT:  [facility] Adult PT Treatment:                                                                                                                             11/05/22 Nustep level 3 LE only x 5 minutes Gait outside negotiating curbs, CGA with SPC Marches, LAQ 2.5# Seated hip abduction Ball b/n knees squeeze Some walking direction changes Ice/IFC to the right shoulder in sitting  10/28/22 Nustep level 3 LE only Seated LAQ 2# Marches 2# Toe and heel raises Estim ice to the right shoulder She has ecchymosis in the right lateral and anterior shoulder, mild tenderness, biggest issue is raising the arm up and out is very painful  10/17/22 Nustep level 5 x 7 minutes Gait outside around the parking [place], some curb negotiating and some walking in the grass LAQ 2.5# Marches 2.5# Red tband HS curls 2.5# hip abduction in standing 2.5# hip extension Ball b/n knees squeeze Red tband rows and  extensions  10/15/22 Nustep level 5 x 6 minutes Gait outside around the parking lot SBA Seated marches and LAQ2.5# Seated hip abduction Toe taps and heel raises Standing hip abduction and extension  10/08/22 Nustep level 5 x 8 minutes Red tband HS curls Calf stretches with assist Marches and LAQ without weight Toe and heel raises in sitting She does have some palpable and visible swelling in th eright medial kne Red tband HS curls Red tband hip abduction Seated red tband rows and extensions Ball in lap isometrics  10/01/22 Nustep level 5 x 8 minutes TUG 23  seconds without device 5XSTS 30 seconds 2.5# LAQ 2.5# marches Seated heel and toe raises Side stepping, backward walking Seated pelvic mobility STM to the upper traps and rhomboids and into the lumbar area  09/05/22 Nustep level 4 x 7 minutes Seated LAQ MArches Pball rollouts 3 ways Side bend elbow touches Toe taps/heel raises STM to the back and upper traps, no bruising but tender has a spot on the elbow with a band aid on reports that it is a blister from her trying to army crawl to the couch MHP/IFC in sitting to the low back  PATIENT EDUCATION:  Education details: POC Person educated: Patient Education method: Explanation Education comprehension: verbalized understanding   HOME EXERCISE PROGRAM: TBD  ASSESSMENT:  CLINICAL IMPRESSION: Patient with right shoulder pain that has continued and not anybetter, she is to see an orthopod tomorrow, we have backed off the exercises as she is in pain and moving slower OBJECTIVE IMPAIRMENTS: Abnormal gait, cardiopulmonary status limiting activity, decreased activity tolerance, decreased balance, decreased endurance, decreased mobility, difficulty walking, decreased ROM, decreased strength, increased muscle spasms, impaired flexibility, postural dysfunction, and pain.    REHAB POTENTIAL: Good  CLINICAL DECISION MAKING: Stable/uncomplicated  EVALUATION COMPLEXITY:  Low   GOALS: Goals reviewed with patient? Yes  SHORT TERM GOALS: Target date: 06/24/22  Independent with initial HEP Goal status: met  LONG TERM GOALS: Target date: 09/02/22  Decrease pain 50% Goal status: progressing 10/01/22  2.  Independent with an advanced HEP  Goal status:ongoing3/12/24  3.  Decrease TUG time to 13 seconds Goal status: progressing 11/05/22  4.  Increase LE strength to 4/5 Goal status: progressing 11/05/22  5.  Walk 10 minutes with SPC and without rest Goal status:  progressing 11/05/22  PLAN: PT FREQUENCY: 1-2x/week  PT DURATION: 12 weeks  PLANNED INTERVENTIONS: Therapeutic exercises, Therapeutic activity, Neuromuscular re-education, Balance training, Gait training, Patient/Family education, Self Care, Joint mobilization, Stair training, Dry Needling, Electrical stimulation, Moist heat, and Manual therapy.  PLAN FOR NEXT SESSION:  see what the MD feels of her shoulder, she has missed appointments due to eye surgeries Jearld Lesch, PT 11/05/2022, 3:45 PM

## 2022-11-06 ENCOUNTER — Encounter: Payer: Self-pay | Admitting: Orthopedic Surgery

## 2022-11-06 ENCOUNTER — Ambulatory Visit (INDEPENDENT_AMBULATORY_CARE_PROVIDER_SITE_OTHER): Payer: Medicare Other | Admitting: Orthopedic Surgery

## 2022-11-06 ENCOUNTER — Ambulatory Visit: Payer: Self-pay

## 2022-11-06 DIAGNOSIS — M12811 Other specific arthropathies, not elsewhere classified, right shoulder: Secondary | ICD-10-CM | POA: Diagnosis not present

## 2022-11-06 NOTE — Progress Notes (Signed)
Office Visit Note   Patient: Linda Mcgrath           Date of Birth: 01/04/1933           MRN: [id number] Visit Date: 11/06/2022 Requested by: [name], Audrie Gallus, MD [address] [place],  [place] [address] PCP: Milinda Antis, Audrie Gallus, MD  Subjective: Chief Complaint  Patient presents with   Right Shoulder - Pain    HPI: NESSIAH DOMIANO is a 89 y.o. female who presents to the office reporting right shoulder pain with painful range of motion.  Pain has been ongoing for 3 weeks.  She actually injured herself reaching up on the shelf.  Felt a sharp pain.  Pain is keep her from laying on that right side.  Describes some radiation and some neck pain but no numbness and tingling that right upper extremity.  Also reports some chronic scapular pain.  She lives alone.  Ambulates with a cane in that right hand.  Nothing too much pain medicine for her back..                ROS: All systems reviewed are negative as they relate to the chief complaint within the history of present illness.  Patient denies fevers or chills.  Assessment & Plan: Visit Diagnoses:  1. Rotator cuff arthropathy, right     Plan: Impression is right shoulder pain with arthritis and rotator cuff arthropathy..  Plan is right shoulder glenohumeral joint injection under ultrasound guidance.  Will see how she does with that intervention.  If it is helpful we could repeat that possibly 1-2 times more this year.  Follow-Up Instructions: No follow-ups on file.   Orders:  Orders Placed This Encounter  Procedures   US Guided Needle Placement - No Linked Charges   No orders of the defined types were placed in this encounter.     Procedures: Large Joint Inj on 11/06/2022 10:40 PM Indications: diagnostic evaluation and pain Details: 18 G 1.5 in needle, posterior approach  Arthrogram: No  Medications: 9 mL bupivacaine 0.5 %; 40 mg methylPREDNISolone acetate 40 MG/ML; 5 mL lidocaine 1 % Outcome: tolerated well,  no immediate complications Procedure, treatment alternatives, risks and benefits explained, specific risks discussed. Consent was given by the patient. Immediately prior to procedure a time out was called to verify the correct patient, procedure, equipment, support staff and site/side marked as required. Patient was prepped and draped in the usual sterile fashion.       Clinical Data: No additional findings.  Objective: Vital Signs: LMP 08/26/1969   Physical Exam:  Constitutional: Patient appears well-developed HEENT:  Head: Normocephalic Eyes:EOM are normal Neck: Normal range of motion Cardiovascular: Normal rate Pulmonary/chest: Effort normal Neurologic: Patient is alert Skin: Skin is warm Psychiatric: Patient has normal mood and affect  Ortho Exam: Ortho exam demonstrates range of motion on the left and right of 50/90/150.  Pretty reasonable rotator cuff strength infraspinatus supraspinatus subscap muscle testing with reasonable deltoid strength.  There is some coarseness with range of motion of that right shoulder.  Neck range of motion intact with flexion extension 30 and 40 degrees bilaterally and rotation is about 30 or 40 degrees bilaterally.  No discrete AC joint tenderness is present.  No paresthesias C5-T1.  Specialty Comments:  No specialty comments available.  Imaging: US Guided Needle Placement - No Linked Charges  Result Date: 11/06/2022 Ultrasound imaging demonstrates needle placement into the right shoulder with extravasation of fluid and no complicating  features    PMFS History: Patient Active Problem List   Diagnosis Date Noted   Right shoulder pain 10/23/2022   Hematoma 10/23/2022   Acute cystitis without hematuria 03/26/2022   Vaginal itching 03/26/2022   Urinary frequency 03/26/2022   Vaginal candida 03/26/2022   Chronic upper back pain 08/30/2020   Aortic atherosclerosis (HCC) 05/31/2020   Abnormal CT scan, small bowel 05/18/2020   Insomnia  12/15/2019   Chronic low back pain 10/18/2019   Osteoarthritis of right knee 08/02/2019   S/P lumbar spinal fusion 07/31/2017   H/O atrial flutter 03/06/2016   Obesity 11/06/2015   Urge incontinence 01/24/2015   Estrogen deficiency 10/25/2014   Lichen planus 12/20/2013   Prediabetes 06/03/2011   HYPERTENSION, BENIGN ESSENTIAL 10/23/2010   OSTEOARTHRITIS, HANDS, BILATERAL 01/24/2010   Hypothyroidism 08/24/2008   Vitamin D deficiency 06/06/2008   Hyperlipidemia 06/06/2008   MENOPAUSAL SYNDROME 03/29/2008   Osteoporosis 03/29/2008   Allergic rhinitis 01/25/2008   IBS 01/25/2008   OSTEOARTHRITIS 01/25/2008   FIBROMYALGIA 01/25/2008   Past Medical History:  Diagnosis Date   Allergic rhinitis    Arthritis    Asthma    Cataract    Bil/lens implant   Colon polyp 1999   small polyp   Diverticulosis    Dysrhythmia    Fibromyalgia    Hyperlipidemia    Hypothyroidism    Internal hemorrhoids    Leg cramps    Lichen planus    Lung nodule    stable LUL 9 mm   Menopausal syndrome    Osteoarthritis    UTI (urinary tract infection)     Family History  Problem Relation Age of Onset   Parkinsonism Father    Colon cancer Brother    Allergies Mother    Heart disease Mother    Kidney failure Son        congenital  (kidney transplant)    Heart failure Son        died suddenly of CHF     Past Surgical History:  Procedure Laterality Date   Abd U/S  11/1998   negative   Abd U/S  01/2001   gallbladder polyps   ABDOMINAL HYSTERECTOMY  1975   total-fibroids   APPENDECTOMY  1975   BREAST BIOPSY     benign   BREAST EXCISIONAL BIOPSY Left 1957   CARDIOVERSION N/A 03/07/2016   Procedure: CARDIOVERSION;  Surgeon: Yates Decamp, MD;  Location[facility] ENDOSCOPY;  Service: Cardiovascular;  Laterality: N/A;   CATARACT EXTRACTION     bilateral   CHOLECYSTECTOMY     COLONOSCOPY  1998   Diverticulosis; polyp\   COLONOSCOPY  09/2002   Diverticulosis, hem   COLONOSCOPY  12/2007    diverticulosis, polyp   DEXA  10/2001   osteopenia   ELECTROPHYSIOLOGIC STUDY N/A 04/03/2016   Procedure: A-Flutter Ablation;  Surgeon: Will [name], MD;  Location: [facility] INVASIVE CV LAB;  Service: Cardiovascular;  Laterality: N/A;   ESOPHAGOGASTRODUODENOSCOPY  2004   Hida scan  01/2001   Negative   KNEE SURGERY     right knee / 11/2004   LAMINECTOMY WITH POSTERIOR LATERAL ARTHRODESIS LEVEL 2 N/A 07/31/2017   Procedure: DECOMPRESSIVE LAMINECTOMY LUMABR FOUR-FIVE, LUMBAR FIVE-SACRAL ONE;  Surgeon: Tia Alert, MD;  Location: [facility] OR;  Service: Neurosurgery;  Laterality: N/A;   laser surgery for glaucoma Left 09/21/2014   MULTIPLE TOOTH EXTRACTIONS     rentinal tear     ROTATOR CUFF REPAIR     x 2 /right  shoulder   SKIN CANCER EXCISION     pre-melanoma / on face   TEE WITHOUT CARDIOVERSION N/A 03/07/2016   Procedure: TRANSESOPHAGEAL ECHOCARDIOGRAM (TEE);  Surgeon: Yates Decamp, MD;  Location: [facility] ENDOSCOPY;  Service: Cardiovascular;  Laterality: N/A;   TOE SURGERY     rt foot    Social History   Occupational History   Occupation: TRAVEL AGENT    Employer: [employer]  Tobacco Use   Smoking status: Never   Smokeless tobacco: Never  Vaping Use   Vaping Use: Never used  Substance and Sexual Activity   Alcohol use: No    Alcohol/week: 0.0 standard drinks of alcohol    Comment: occasional-rare   Drug use: No   Sexual activity: Never

## 2022-11-07 ENCOUNTER — Ambulatory Visit: Payer: Medicare Other | Admitting: Physical Therapy

## 2022-11-07 ENCOUNTER — Encounter: Payer: Self-pay | Admitting: Physical Therapy

## 2022-11-07 DIAGNOSIS — M542 Cervicalgia: Secondary | ICD-10-CM | POA: Diagnosis not present

## 2022-11-07 DIAGNOSIS — R262 Difficulty in walking, not elsewhere classified: Secondary | ICD-10-CM

## 2022-11-07 DIAGNOSIS — M62830 Muscle spasm of back: Secondary | ICD-10-CM | POA: Diagnosis not present

## 2022-11-07 DIAGNOSIS — M5459 Other low back pain: Secondary | ICD-10-CM

## 2022-11-07 NOTE — Therapy (Signed)
OUTPATIENT PHYSICAL THERAPY THORACOLUMBAR TREATMENT    Patient Name: Linda Mcgrath MRN: [id number] DOB:04/17/1933, 89 y.o., female Today's Date: 11/07/2022   PT End of Session - 11/07/22 1351     Visit Number 26    Date for PT Re-Evaluation 12/06/22    Authorization Type [employer]    PT Start Time 1350    PT Stop Time 1435    PT Time Calculation (min) 45 min    Activity Tolerance Patient tolerated treatment well;Patient limited by pain    Behavior During Therapy [facility] for tasks assessed/performed             Past Medical History:  Diagnosis Date   Allergic rhinitis    Arthritis    Asthma    Cataract    Bil/lens implant   Colon polyp 1999   small polyp   Diverticulosis    Dysrhythmia    Fibromyalgia    Hyperlipidemia    Hypothyroidism    Internal hemorrhoids    Leg cramps    Lichen planus    Lung nodule    stable LUL 9 mm   Menopausal syndrome    Osteoarthritis    UTI (urinary tract infection)    Past Surgical History:  Procedure Laterality Date   Abd U/S  11/1998   negative   Abd U/S  01/2001   gallbladder polyps   ABDOMINAL HYSTERECTOMY  1975   total-fibroids   APPENDECTOMY  1975   BREAST BIOPSY     benign   BREAST EXCISIONAL BIOPSY Left 1957   CARDIOVERSION N/A 03/07/2016   Procedure: CARDIOVERSION;  Surgeon: Yates Decamp, MD;  Location: [facility] ENDOSCOPY;  Service: Cardiovascular;  Laterality: N/A;   CATARACT EXTRACTION     bilateral   CHOLECYSTECTOMY     COLONOSCOPY  1998   Diverticulosis; polyp\   COLONOSCOPY  09/2002   Diverticulosis, hem   COLONOSCOPY  12/2007   diverticulosis, polyp   DEXA  10/2001   osteopenia   ELECTROPHYSIOLOGIC STUDY N/A 04/03/2016   Procedure: A-Flutter Ablation;  Surgeon: Will [name], MD;  Location: [facility] INVASIVE CV LAB;  Service: Cardiovascular;  Laterality: N/A;   ESOPHAGOGASTRODUODENOSCOPY  2004   Hida scan  01/2001   Negative   KNEE SURGERY     right knee / 11/2004   LAMINECTOMY WITH POSTERIOR  LATERAL ARTHRODESIS LEVEL 2 N/A 07/31/2017   Procedure: DECOMPRESSIVE LAMINECTOMY LUMABR FOUR-FIVE, LUMBAR FIVE-SACRAL ONE;  Surgeon: Tia Alert, MD;  Location: [facility] OR;  Service: Neurosurgery;  Laterality: N/A;   laser surgery for glaucoma Left 09/21/2014   MULTIPLE TOOTH EXTRACTIONS     rentinal tear     ROTATOR CUFF REPAIR     x 2 /right shoulder   SKIN CANCER EXCISION     pre-melanoma / on face   TEE WITHOUT CARDIOVERSION N/A 03/07/2016   Procedure: TRANSESOPHAGEAL ECHOCARDIOGRAM (TEE);  Surgeon: Yates Decamp, MD;  Location: [facility] ENDOSCOPY;  Service: Cardiovascular;  Laterality: N/A;   TOE SURGERY     rt foot    Patient Active Problem List   Diagnosis Date Noted   Right shoulder pain 10/23/2022   Hematoma 10/23/2022   Acute cystitis without hematuria 03/26/2022   Vaginal itching 03/26/2022   Urinary frequency 03/26/2022   Vaginal candida 03/26/2022   Chronic upper back pain 08/30/2020   Aortic atherosclerosis (HCC) 05/31/2020   Abnormal CT scan, small bowel 05/18/2020   Insomnia 12/15/2019   Chronic low back pain 10/18/2019   Osteoarthritis of right knee 08/02/2019  S/P lumbar spinal fusion 07/31/2017   H/O atrial flutter 03/06/2016   Obesity 11/06/2015   Urge incontinence 01/24/2015   Estrogen deficiency 10/25/2014   Lichen planus 12/20/2013   Prediabetes 06/03/2011   HYPERTENSION, BENIGN ESSENTIAL 10/23/2010   OSTEOARTHRITIS, HANDS, BILATERAL 01/24/2010   Hypothyroidism 08/24/2008   Vitamin D deficiency 06/06/2008   Hyperlipidemia 06/06/2008   MENOPAUSAL SYNDROME 03/29/2008   Osteoporosis 03/29/2008   Allergic rhinitis 01/25/2008   IBS 01/25/2008   OSTEOARTHRITIS 01/25/2008   FIBROMYALGIA 01/25/2008    PCP: [employer]  REFERRING PROVIDER: [name]  REFERRING DIAG: Back pain, difficulty walking, osteoporosis  Rationale for Evaluation and Treatment Rehabilitation  THERAPY DIAG:  Other low back pain  Muscle spasm of back  Difficulty in walking, not elsewhere  classified  Cervicalgia  ONSET DATE: September 2023  SUBJECTIVE:                                                                                                                                                                                           SUBJECTIVE STATEMENT: Patient had a steroid injection yesterday, reports that she did not sleep last night, reports feeling a little better now  PERTINENT HISTORY:  See above  PAIN:  Are you having pain? Yes: NPRS scale: 6/10 Pain location: knees Pain description: ache, sore Aggravating factors: yardwork, hosuework pain up to 8/10 Relieving factors: rest, pain can be 0/10 at times   PRECAUTIONS: None  WEIGHT BEARING RESTRICTIONS: No  FALLS:  Has patient fallen in last 6 months? No  LIVING ENVIRONMENT: Lives with: lives with their family and lives alone Lives in: House/apartment Stairs: No Has following equipment at home: Single point cane  OCCUPATION: retired  PLOF: Independent able to do yardwork and housework  PATIENT GOALS: walk better, less pain   OBJECTIVE:   DIAGNOSTIC FINDINGS:  osteoporosis  PATIENT SURVEYS:  FOTO 31  COGNITION:  Overall cognitive status: Within functional limits for tasks assessed     SENSATION: WFL  MUSCLE LENGTH: Tight calves, HS and piriformis  POSTURE: rounded shoulders, forward head, and decreased lumbar lordosis  PALPATION: She is tight and tender in the lumbar area and into the thoracic and upper trap area, mild tenderness in the buttocks  LUMBAR ROM:   AROM eval  Flexion Decreased 50%  Extension Decreased 100% with pain  Right lateral flexion   Left lateral flexion   Right rotation   Left rotation    (Blank rows = not tested)  LOWER EXTREMITY ROM:     Active  Right eval Left eval  Hip flexion    Hip extension    Hip abduction  Hip adduction    Hip internal rotation    Hip external rotation    Knee flexion    Knee extension    Ankle dorsiflexion     Ankle plantarflexion    Ankle inversion    Ankle eversion     (Blank rows = not tested)  LOWER EXTREMITY MMT:    MMT Right eval Left eval  Hip flexion 3+ 3+  Hip extension    Hip abduction 3+ 3+  Hip adduction    Hip internal rotation    Hip external rotation    Knee flexion 3+ with pain 3+  Knee extension 3+ with pain 3+  Ankle dorsiflexion 4- 4-  Ankle plantarflexion    Ankle inversion    Ankle eversion     (Blank rows = not tested)  FUNCTIONAL TESTS:  5 times sit to stand: 31 seconds: 10/01/22 30 seconds Timed up and go (TUG): 26 seconds with [facility]   07/25/22 TUG 22 seconds with SPC, 08/20/22 18 seconds with SPC, 10/01/22 23 seconds without device 11/05/22 with SPC 20 seconds  GAIT: Distance walked: 60 feet Assistive device utilized: Single point cane Level of assistance: SBA Comments: slow, slightly stooped    TODAY'S TREATMENT:  [facility] Adult PT Treatment:                                                                                                                             11/07/22 Nustep LE only level 3 x 6 minutes Small walk outside up and down a curb and some uneven terrain 2.5# marches, LAQ 2.5# abduction and extension MHP/IFC to the low back  11/05/22 Nustep level 3 LE only x 5 minutes Gait outside negotiating curbs, CGA with SPC Marches, LAQ 2.5# Seated hip abduction Ball b/n knees squeeze Some walking direction changes Ice/IFC to the right shoulder in sitting  10/28/22 Nustep level 3 LE only Seated LAQ 2# Marches 2# Toe and heel raises Estim ice to the right shoulder She has ecchymosis in the right lateral and anterior shoulder, mild tenderness, biggest issue is raising the arm up and out is very painful  10/17/22 Nustep level 5 x 7 minutes Gait outside around the parking [place], some curb negotiating and some walking in the grass LAQ 2.5# Marches 2.5# Red tband HS curls 2.5# hip abduction in standing 2.5# hip extension Ball b/n knees  squeeze Red tband rows and extensions  PATIENT EDUCATION:  Education details: POC Person educated: Patient Education method: Explanation Education comprehension: verbalized understanding   HOME EXERCISE PROGRAM: TBD  ASSESSMENT:  CLINICAL IMPRESSION: Patient not doing great today with her shoulder injection yesterday and then not sleeping last night, reports shoulder pain a little better but back is a little worse she does report that she has been doing some shopping today and has been on her feet OBJECTIVE IMPAIRMENTS: Abnormal gait, cardiopulmonary status limiting activity, decreased activity tolerance, decreased balance, decreased endurance, decreased mobility, difficulty walking, decreased ROM, decreased  strength, increased muscle spasms, impaired flexibility, postural dysfunction, and pain.    REHAB POTENTIAL: Good  CLINICAL DECISION MAKING: Stable/uncomplicated  EVALUATION COMPLEXITY: Low   GOALS: Goals reviewed with patient? Yes  SHORT TERM GOALS: Target date: 06/24/22  Independent with initial HEP Goal status: met  LONG TERM GOALS: Target date: 09/02/22  Decrease pain 50% Goal status: progressing 10/01/22  2.  Independent with an advanced HEP  Goal status:ongoing3/12/24  3.  Decrease TUG time to 13 seconds Goal status: progressing 11/05/22  4.  Increase LE strength to 4/5 Goal status: progressing 11/05/22  5.  Walk 10 minutes with SPC and without rest Goal status:  progressing 11/05/22  PLAN: PT FREQUENCY: 1-2x/week  PT DURATION: 12 weeks  PLANNED INTERVENTIONS: Therapeutic exercises, Therapeutic activity, Neuromuscular re-education, Balance training, Gait training, Patient/Family education, Self Care, Joint mobilization, Stair training, Dry Needling, Electrical stimulation, Moist heat, and Manual therapy.  PLAN FOR NEXT SESSION:  See how the injection does with her shoulder and if we can resume some activities. Jearld Lesch, PT 11/07/2022, 1:54 PM

## 2022-11-09 ENCOUNTER — Encounter: Payer: Self-pay | Admitting: Orthopedic Surgery

## 2022-11-09 MED ORDER — LIDOCAINE HCL 1 % IJ SOLN
5.0000 mL | INTRAMUSCULAR | Status: AC | PRN
  Administered 2022-11-06: 5 mL

## 2022-11-09 MED ORDER — METHYLPREDNISOLONE ACETATE 40 MG/ML IJ SUSP
40.0000 mg | INTRAMUSCULAR | Status: AC | PRN
  Administered 2022-11-06: 40 mg via INTRA_ARTICULAR

## 2022-11-09 MED ORDER — BUPIVACAINE HCL 0.5 % IJ SOLN
9.0000 mL | INTRAMUSCULAR | Status: AC | PRN
  Administered 2022-11-06: 9 mL via INTRA_ARTICULAR

## 2022-11-11 ENCOUNTER — Ambulatory Visit: Payer: Medicare Other | Admitting: Physical Therapy

## 2022-11-11 ENCOUNTER — Encounter: Payer: Self-pay | Admitting: Physical Therapy

## 2022-11-11 DIAGNOSIS — R262 Difficulty in walking, not elsewhere classified: Secondary | ICD-10-CM | POA: Diagnosis not present

## 2022-11-11 DIAGNOSIS — M5459 Other low back pain: Secondary | ICD-10-CM

## 2022-11-11 DIAGNOSIS — M62830 Muscle spasm of back: Secondary | ICD-10-CM | POA: Diagnosis not present

## 2022-11-11 DIAGNOSIS — M542 Cervicalgia: Secondary | ICD-10-CM | POA: Diagnosis not present

## 2022-11-11 NOTE — Therapy (Signed)
OUTPATIENT PHYSICAL THERAPY THORACOLUMBAR TREATMENT    Patient Name: Linda Mcgrath MRN: [id number] DOB:18-Sep-1932, 89 y.o., female Today's Date: 11/11/2022   PT End of Session - 11/11/22 1344     Visit Number 27    Date for PT Re-Evaluation 12/06/22    Authorization Type [employer]    PT Start Time 1319    PT Stop Time 1405    PT Time Calculation (min) 46 min    Activity Tolerance Patient tolerated treatment well;Patient limited by pain    Behavior During Therapy [facility] for tasks assessed/performed             Past Medical History:  Diagnosis Date   Allergic rhinitis    Arthritis    Asthma    Cataract    Bil/lens implant   Colon polyp 1999   small polyp   Diverticulosis    Dysrhythmia    Fibromyalgia    Hyperlipidemia    Hypothyroidism    Internal hemorrhoids    Leg cramps    Lichen planus    Lung nodule    stable LUL 9 mm   Menopausal syndrome    Osteoarthritis    UTI (urinary tract infection)    Past Surgical History:  Procedure Laterality Date   Abd U/S  11/1998   negative   Abd U/S  01/2001   gallbladder polyps   ABDOMINAL HYSTERECTOMY  1975   total-fibroids   APPENDECTOMY  1975   BREAST BIOPSY     benign   BREAST EXCISIONAL BIOPSY Left 1957   CARDIOVERSION N/A 03/07/2016   Procedure: CARDIOVERSION;  Surgeon: Yates Decamp, MD;  Location: [facility] ENDOSCOPY;  Service: Cardiovascular;  Laterality: N/A;   CATARACT EXTRACTION     bilateral   CHOLECYSTECTOMY     COLONOSCOPY  1998   Diverticulosis; polyp\   COLONOSCOPY  09/2002   Diverticulosis, hem   COLONOSCOPY  12/2007   diverticulosis, polyp   DEXA  10/2001   osteopenia   ELECTROPHYSIOLOGIC STUDY N/A 04/03/2016   Procedure: A-Flutter Ablation;  Surgeon: Will [name], MD;  Location: [facility] INVASIVE CV LAB;  Service: Cardiovascular;  Laterality: N/A;   ESOPHAGOGASTRODUODENOSCOPY  2004   Hida scan  01/2001   Negative   KNEE SURGERY     right knee / 11/2004   LAMINECTOMY WITH POSTERIOR  LATERAL ARTHRODESIS LEVEL 2 N/A 07/31/2017   Procedure: DECOMPRESSIVE LAMINECTOMY LUMABR FOUR-FIVE, LUMBAR FIVE-SACRAL ONE;  Surgeon: Tia Alert, MD;  Location: [facility] OR;  Service: Neurosurgery;  Laterality: N/A;   laser surgery for glaucoma Left 09/21/2014   MULTIPLE TOOTH EXTRACTIONS     rentinal tear     ROTATOR CUFF REPAIR     x 2 /right shoulder   SKIN CANCER EXCISION     pre-melanoma / on face   TEE WITHOUT CARDIOVERSION N/A 03/07/2016   Procedure: TRANSESOPHAGEAL ECHOCARDIOGRAM (TEE);  Surgeon: Yates Decamp, MD;  Location: [facility] ENDOSCOPY;  Service: Cardiovascular;  Laterality: N/A;   TOE SURGERY     rt foot    Patient Active Problem List   Diagnosis Date Noted   Right shoulder pain 10/23/2022   Hematoma 10/23/2022   Acute cystitis without hematuria 03/26/2022   Vaginal itching 03/26/2022   Urinary frequency 03/26/2022   Vaginal candida 03/26/2022   Chronic upper back pain 08/30/2020   Aortic atherosclerosis (HCC) 05/31/2020   Abnormal CT scan, small bowel 05/18/2020   Insomnia 12/15/2019   Chronic low back pain 10/18/2019   Osteoarthritis of right knee 08/02/2019  S/P lumbar spinal fusion 07/31/2017   H/O atrial flutter 03/06/2016   Obesity 11/06/2015   Urge incontinence 01/24/2015   Estrogen deficiency 10/25/2014   Lichen planus 12/20/2013   Prediabetes 06/03/2011   HYPERTENSION, BENIGN ESSENTIAL 10/23/2010   OSTEOARTHRITIS, HANDS, BILATERAL 01/24/2010   Hypothyroidism 08/24/2008   Vitamin D deficiency 06/06/2008   Hyperlipidemia 06/06/2008   MENOPAUSAL SYNDROME 03/29/2008   Osteoporosis 03/29/2008   Allergic rhinitis 01/25/2008   IBS 01/25/2008   OSTEOARTHRITIS 01/25/2008   FIBROMYALGIA 01/25/2008    PCP: [employer]  REFERRING PROVIDER: [name]  REFERRING DIAG: Back pain, difficulty walking, osteoporosis  Rationale for Evaluation and Treatment Rehabilitation  THERAPY DIAG:  Other low back pain  Muscle spasm of back  Difficulty in walking, not elsewhere  classified  ONSET DATE: September 2023  SUBJECTIVE:                                                                                                                                                                                           SUBJECTIVE STATEMENT: Reports that her shoulder is feeling much better, less pain overall, reports that she had a good weekend  PERTINENT HISTORY:  See above  PAIN:  Are you having pain? Yes: NPRS scale: 4/10 Pain location: knees Pain description: ache, sore Aggravating factors: yardwork, hosuework pain up to 8/10 Relieving factors: rest, pain can be 0/10 at times   PRECAUTIONS: None  WEIGHT BEARING RESTRICTIONS: No  FALLS:  Has patient fallen in last 6 months? No  LIVING ENVIRONMENT: Lives with: lives with their family and lives alone Lives in: House/apartment Stairs: No Has following equipment at home: Single point cane  OCCUPATION: retired  PLOF: Independent able to do yardwork and housework  PATIENT GOALS: walk better, less pain   OBJECTIVE:   DIAGNOSTIC FINDINGS:  osteoporosis  PATIENT SURVEYS:  FOTO 31  COGNITION:  Overall cognitive status: Within functional limits for tasks assessed     SENSATION: WFL  MUSCLE LENGTH: Tight calves, HS and piriformis  POSTURE: rounded shoulders, forward head, and decreased lumbar lordosis  PALPATION: She is tight and tender in the lumbar area and into the thoracic and upper trap area, mild tenderness in the buttocks  LUMBAR ROM:   AROM eval  Flexion Decreased 50%  Extension Decreased 100% with pain  Right lateral flexion   Left lateral flexion   Right rotation   Left rotation    (Blank rows = not tested)  LOWER EXTREMITY ROM:     Active  Right eval Left eval  Hip flexion    Hip extension    Hip abduction    Hip adduction  Hip internal rotation    Hip external rotation    Knee flexion    Knee extension    Ankle dorsiflexion    Ankle plantarflexion    Ankle  inversion    Ankle eversion     (Blank rows = not tested)  LOWER EXTREMITY MMT:    MMT Right eval Left eval  Hip flexion 3+ 3+  Hip extension    Hip abduction 3+ 3+  Hip adduction    Hip internal rotation    Hip external rotation    Knee flexion 3+ with pain 3+  Knee extension 3+ with pain 3+  Ankle dorsiflexion 4- 4-  Ankle plantarflexion    Ankle inversion    Ankle eversion     (Blank rows = not tested)  FUNCTIONAL TESTS:  5 times sit to stand: 31 seconds: 10/01/22 30 seconds Timed up and go (TUG): 26 seconds with [facility]   07/25/22 TUG 22 seconds with SPC, 08/20/22 18 seconds with SPC, 10/01/22 23 seconds without device 11/05/22 with SPC 20 seconds  GAIT: Distance walked: 60 feet Assistive device utilized: Single point cane Level of assistance: SBA Comments: slow, slightly stooped    TODAY'S TREATMENT:  [facility] Adult PT Treatment:                                                                                                                             11/11/22 Nustel LE only level 3 x 5 minutes PROM of the right shoulder to maintain her ROM Seated on dynadisc lumbar motions and light stability 3# LAQ 3# marches 3# hip abduction and extension Seated elbow touches for ROM Side stepping Cone toe touches using SPC and light HHA  11/07/22 Nustep LE only level 3 x 6 minutes Small walk outside up and down a curb and some uneven terrain 2.5# marches, LAQ 2.5# abduction and extension MHP/IFC to the low back  11/05/22 Nustep level 3 LE only x 5 minutes Gait outside negotiating curbs, CGA with SPC Marches, LAQ 2.5# Seated hip abduction Ball b/n knees squeeze Some walking direction changes Ice/IFC to the right shoulder in sitting  10/28/22 Nustep level 3 LE only Seated LAQ 2# Marches 2# Toe and heel raises Estim ice to the right shoulder She has ecchymosis in the right lateral and anterior shoulder, mild tenderness, biggest issue is raising the arm up and out is very  painful  10/17/22 Nustep level 5 x 7 minutes Gait outside around the parking [place], some curb negotiating and some walking in the grass LAQ 2.5# Marches 2.5# Red tband HS curls 2.5# hip abduction in standing 2.5# hip extension Ball b/n knees squeeze Red tband rows and extensions  PATIENT EDUCATION:  Education details: POC Person educated: Patient Education method: Explanation Education comprehension: verbalized understanding   HOME EXERCISE PROGRAM: TBD  ASSESSMENT:  CLINICAL IMPRESSION: Patient feeling better after injection in the shoulder, I started some light ROM exercises with this and added some balance activities,  needed HHA with balance OBJECTIVE IMPAIRMENTS: Abnormal gait, cardiopulmonary status limiting activity, decreased activity tolerance, decreased balance, decreased endurance, decreased mobility, difficulty walking, decreased ROM, decreased strength, increased muscle spasms, impaired flexibility, postural dysfunction, and pain.    REHAB POTENTIAL: Good  CLINICAL DECISION MAKING: Stable/uncomplicated  EVALUATION COMPLEXITY: Low   GOALS: Goals reviewed with patient? Yes  SHORT TERM GOALS: Target date: 06/24/22  Independent with initial HEP Goal status: met  LONG TERM GOALS: Target date: 09/02/22  Decrease pain 50% Goal status: progressing 10/01/22  2.  Independent with an advanced HEP  Goal status:ongoing3/12/24  3.  Decrease TUG time to 13 seconds Goal status: progressing 11/05/22  4.  Increase LE strength to 4/5 Goal status: progressing 11/05/22  5.  Walk 10 minutes with SPC and without rest Goal status:  progressing 11/05/22  PLAN: PT FREQUENCY: 1-2x/week  PT DURATION: 12 weeks  PLANNED INTERVENTIONS: Therapeutic exercises, Therapeutic activity, Neuromuscular re-education, Balance training, Gait training, Patient/Family education, Self Care, Joint mobilization, Stair training, Dry Needling, Electrical stimulation, Moist heat, and Manual  therapy.  PLAN FOR NEXT SESSION:  continue to add some balance and assure the ROM of the shoulder does not regress Meila Berke W, PT 11/11/2022, 1:45 PM

## 2022-11-13 ENCOUNTER — Encounter: Payer: Self-pay | Admitting: Physical Therapy

## 2022-11-13 ENCOUNTER — Ambulatory Visit: Payer: Medicare Other | Admitting: Physical Therapy

## 2022-11-13 DIAGNOSIS — M5459 Other low back pain: Secondary | ICD-10-CM

## 2022-11-13 DIAGNOSIS — M542 Cervicalgia: Secondary | ICD-10-CM | POA: Diagnosis not present

## 2022-11-13 DIAGNOSIS — R262 Difficulty in walking, not elsewhere classified: Secondary | ICD-10-CM

## 2022-11-13 DIAGNOSIS — M62830 Muscle spasm of back: Secondary | ICD-10-CM

## 2022-11-13 NOTE — Therapy (Signed)
OUTPATIENT PHYSICAL THERAPY THORACOLUMBAR TREATMENT    Patient Name: Linda Mcgrath MRN: [id number] DOB:June 15, 1933, 89 y.o., female Today's Date: 11/13/2022   PT End of Session - 11/13/22 1250     Visit Number 28    Date for PT Re-Evaluation 12/06/22    Authorization Type [employer]    PT Start Time 1250    PT Stop Time 1339    PT Time Calculation (min) 49 min    Activity Tolerance Patient tolerated treatment well;Patient limited by pain    Behavior During Therapy [facility] for tasks assessed/performed             Past Medical History:  Diagnosis Date   Allergic rhinitis    Arthritis    Asthma    Cataract    Bil/lens implant   Colon polyp 1999   small polyp   Diverticulosis    Dysrhythmia    Fibromyalgia    Hyperlipidemia    Hypothyroidism    Internal hemorrhoids    Leg cramps    Lichen planus    Lung nodule    stable LUL 9 mm   Menopausal syndrome    Osteoarthritis    UTI (urinary tract infection)    Past Surgical History:  Procedure Laterality Date   Abd U/S  11/1998   negative   Abd U/S  01/2001   gallbladder polyps   ABDOMINAL HYSTERECTOMY  1975   total-fibroids   APPENDECTOMY  1975   BREAST BIOPSY     benign   BREAST EXCISIONAL BIOPSY Left 1957   CARDIOVERSION N/A 03/07/2016   Procedure: CARDIOVERSION;  Surgeon: Yates Decamp, MD;  Location: [facility] ENDOSCOPY;  Service: Cardiovascular;  Laterality: N/A;   CATARACT EXTRACTION     bilateral   CHOLECYSTECTOMY     COLONOSCOPY  1998   Diverticulosis; polyp\   COLONOSCOPY  09/2002   Diverticulosis, hem   COLONOSCOPY  12/2007   diverticulosis, polyp   DEXA  10/2001   osteopenia   ELECTROPHYSIOLOGIC STUDY N/A 04/03/2016   Procedure: A-Flutter Ablation;  Surgeon: Will [name], MD;  Location: [facility] INVASIVE CV LAB;  Service: Cardiovascular;  Laterality: N/A;   ESOPHAGOGASTRODUODENOSCOPY  2004   Hida scan  01/2001   Negative   KNEE SURGERY     right knee / 11/2004   LAMINECTOMY WITH POSTERIOR  LATERAL ARTHRODESIS LEVEL 2 N/A 07/31/2017   Procedure: DECOMPRESSIVE LAMINECTOMY LUMABR FOUR-FIVE, LUMBAR FIVE-SACRAL ONE;  Surgeon: Tia Alert, MD;  Location: [facility] OR;  Service: Neurosurgery;  Laterality: N/A;   laser surgery for glaucoma Left 09/21/2014   MULTIPLE TOOTH EXTRACTIONS     rentinal tear     ROTATOR CUFF REPAIR     x 2 /right shoulder   SKIN CANCER EXCISION     pre-melanoma / on face   TEE WITHOUT CARDIOVERSION N/A 03/07/2016   Procedure: TRANSESOPHAGEAL ECHOCARDIOGRAM (TEE);  Surgeon: Yates Decamp, MD;  Location: [facility] ENDOSCOPY;  Service: Cardiovascular;  Laterality: N/A;   TOE SURGERY     rt foot    Patient Active Problem List   Diagnosis Date Noted   Right shoulder pain 10/23/2022   Hematoma 10/23/2022   Acute cystitis without hematuria 03/26/2022   Vaginal itching 03/26/2022   Urinary frequency 03/26/2022   Vaginal candida 03/26/2022   Chronic upper back pain 08/30/2020   Aortic atherosclerosis (HCC) 05/31/2020   Abnormal CT scan, small bowel 05/18/2020   Insomnia 12/15/2019   Chronic low back pain 10/18/2019   Osteoarthritis of right knee 08/02/2019  S/P lumbar spinal fusion 07/31/2017   H/O atrial flutter 03/06/2016   Obesity 11/06/2015   Urge incontinence 01/24/2015   Estrogen deficiency 10/25/2014   Lichen planus 12/20/2013   Prediabetes 06/03/2011   HYPERTENSION, BENIGN ESSENTIAL 10/23/2010   OSTEOARTHRITIS, HANDS, BILATERAL 01/24/2010   Hypothyroidism 08/24/2008   Vitamin D deficiency 06/06/2008   Hyperlipidemia 06/06/2008   MENOPAUSAL SYNDROME 03/29/2008   Osteoporosis 03/29/2008   Allergic rhinitis 01/25/2008   IBS 01/25/2008   OSTEOARTHRITIS 01/25/2008   FIBROMYALGIA 01/25/2008    PCP: [employer]  REFERRING PROVIDER: [name]  REFERRING DIAG: Back pain, difficulty walking, osteoporosis  Rationale for Evaluation and Treatment Rehabilitation  THERAPY DIAG:  Other low back pain  Muscle spasm of back  Difficulty in walking, not elsewhere  classified  Cervicalgia  ONSET DATE: September 2023  SUBJECTIVE:                                                                                                                                                                                           SUBJECTIVE STATEMENT: Reports that she is having a little less knee pain and still shoulder doing better.  PERTINENT HISTORY:  See above  PAIN:  Are you having pain? Yes: NPRS scale: 3/10 Pain location: knees Pain description: ache, sore Aggravating factors: yardwork, hosuework pain up to 8/10 Relieving factors: rest, pain can be 0/10 at times   PRECAUTIONS: None  WEIGHT BEARING RESTRICTIONS: No  FALLS:  Has patient fallen in last 6 months? No  LIVING ENVIRONMENT: Lives with: lives with their family and lives alone Lives in: House/apartment Stairs: No Has following equipment at home: Single point cane  OCCUPATION: retired  PLOF: Independent able to do yardwork and housework  PATIENT GOALS: walk better, less pain   OBJECTIVE:   DIAGNOSTIC FINDINGS:  osteoporosis  PATIENT SURVEYS:  FOTO 31  COGNITION:  Overall cognitive status: Within functional limits for tasks assessed     SENSATION: WFL  MUSCLE LENGTH: Tight calves, HS and piriformis  POSTURE: rounded shoulders, forward head, and decreased lumbar lordosis  PALPATION: She is tight and tender in the lumbar area and into the thoracic and upper trap area, mild tenderness in the buttocks  LUMBAR ROM:   AROM eval  Flexion Decreased 50%  Extension Decreased 100% with pain  Right lateral flexion   Left lateral flexion   Right rotation   Left rotation    (Blank rows = not tested)  LOWER EXTREMITY ROM:     Active  Right eval Left eval  Hip flexion    Hip extension    Hip abduction    Hip adduction  Hip internal rotation    Hip external rotation    Knee flexion    Knee extension    Ankle dorsiflexion    Ankle plantarflexion    Ankle  inversion    Ankle eversion     (Blank rows = not tested)  LOWER EXTREMITY MMT:    MMT Right eval Left eval  Hip flexion 3+ 3+  Hip extension    Hip abduction 3+ 3+  Hip adduction    Hip internal rotation    Hip external rotation    Knee flexion 3+ with pain 3+  Knee extension 3+ with pain 3+  Ankle dorsiflexion 4- 4-  Ankle plantarflexion    Ankle inversion    Ankle eversion     (Blank rows = not tested)  FUNCTIONAL TESTS:  5 times sit to stand: 31 seconds: 10/01/22 30 seconds Timed up and go (TUG): 26 seconds with [facility]   07/25/22 TUG 22 seconds with SPC, 08/20/22 18 seconds with SPC, 10/01/22 23 seconds without device 11/05/22 with SPC 20 seconds  GAIT: Distance walked: 60 feet Assistive device utilized: Single point cane Level of assistance: SBA Comments: slow, slightly stooped    TODAY'S TREATMENT:  [facility] Adult PT Treatment:                                                                                                                             11/13/22 Nustep level 4 x 6 minutes Gait around the parking [place] in the back [facility], HHA for curbs LAQ 2.5# 2x10 Marches 2.5# 2x10 Seated hip abduction 2.5# Standing HS curls 2.5# Standing 2.5# hip abduction On dynadisc lumbar pelvic motions Dynadisc ankle motions Supine feet on ball K2C, trunk rotation, small bridge   11/11/22 Nustel LE only level 3 x 5 minutes PROM of the right shoulder to maintain her ROM Seated on dynadisc lumbar motions and light stability 3# LAQ 3# marches 3# hip abduction and extension Seated elbow touches for ROM Side stepping Cone toe touches using SPC and light HHA  11/07/22 Nustep LE only level 3 x 6 minutes Small walk outside up and down a curb and some uneven terrain 2.5# marches, LAQ 2.5# abduction and extension MHP/IFC to the low back  11/05/22 Nustep level 3 LE only x 5 minutes Gait outside negotiating curbs, CGA with SPC Marches, LAQ 2.5# Seated hip abduction Ball b/n knees  squeeze Some walking direction changes Ice/IFC to the right shoulder in sitting  10/28/22 Nustep level 3 LE only Seated LAQ 2# Marches 2# Toe and heel raises Estim ice to the right shoulder She has ecchymosis in the right lateral and anterior shoulder, mild tenderness, biggest issue is raising the arm up and out is very painful  10/17/22 Nustep level 5 x 7 minutes Gait outside around the parking [place], some curb negotiating and some walking in the grass LAQ 2.5# Marches 2.5# Red tband HS curls 2.5# hip abduction in standing 2.5# hip extension Ball b/n  knees squeeze Red tband rows and extensions  PATIENT EDUCATION:  Education details: POC Person educated: Patient Education method: Explanation Education comprehension: verbalized understanding   HOME EXERCISE PROGRAM: TBD  ASSESSMENT:  CLINICAL IMPRESSION: Patient continues to feel a little better, so we did a walk outside, CGA for curbs, I will plan to reassess next week now that she is doing better. OBJECTIVE IMPAIRMENTS: Abnormal gait, cardiopulmonary status limiting activity, decreased activity tolerance, decreased balance, decreased endurance, decreased mobility, difficulty walking, decreased ROM, decreased strength, increased muscle spasms, impaired flexibility, postural dysfunction, and pain.    REHAB POTENTIAL: Good  CLINICAL DECISION MAKING: Stable/uncomplicated  EVALUATION COMPLEXITY: Low   GOALS: Goals reviewed with patient? Yes  SHORT TERM GOALS: Target date: 06/24/22  Independent with initial HEP Goal status: met  LONG TERM GOALS: Target date: 09/02/22  Decrease pain 50% Goal status: progressing 10/01/22  2.  Independent with an advanced HEP  Goal status:ongoing3/12/24  3.  Decrease TUG time to 13 seconds Goal status: progressing 11/05/22  4.  Increase LE strength to 4/5 Goal status: progressing 11/05/22  5.  Walk 10 minutes with SPC and without rest Goal status:  progressing  11/05/22  PLAN: PT FREQUENCY: 1-2x/week  PT DURATION: 12 weeks  PLANNED INTERVENTIONS: Therapeutic exercises, Therapeutic activity, Neuromuscular re-education, Balance training, Gait training, Patient/Family education, Self Care, Joint mobilization, Stair training, Dry Needling, Electrical stimulation, Moist heat, and Manual therapy.  PLAN FOR NEXT SESSION:reassess TUG and walking Rathana Viveros W, PT 11/13/2022, 12:51 PM

## 2022-11-18 ENCOUNTER — Ambulatory Visit: Payer: Medicare Other | Admitting: Physical Therapy

## 2022-11-18 ENCOUNTER — Encounter: Payer: Self-pay | Admitting: Physical Therapy

## 2022-11-18 DIAGNOSIS — R262 Difficulty in walking, not elsewhere classified: Secondary | ICD-10-CM | POA: Diagnosis not present

## 2022-11-18 DIAGNOSIS — M542 Cervicalgia: Secondary | ICD-10-CM | POA: Diagnosis not present

## 2022-11-18 DIAGNOSIS — M62830 Muscle spasm of back: Secondary | ICD-10-CM | POA: Diagnosis not present

## 2022-11-18 DIAGNOSIS — M5459 Other low back pain: Secondary | ICD-10-CM | POA: Diagnosis not present

## 2022-11-18 NOTE — Therapy (Signed)
OUTPATIENT PHYSICAL THERAPY THORACOLUMBAR TREATMENT    Patient Name: Linda Mcgrath MRN: [id number] DOB:December 19, 1932, 89 y.o., female Today's Date: 11/18/2022   PT End of Session - 11/18/22 1251     Visit Number 29    Date for PT Re-Evaluation 12/06/22    Authorization Type [employer]    PT Start Time 1250    PT Stop Time 1335    PT Time Calculation (min) 45 min    Activity Tolerance Patient tolerated treatment well;Patient limited by pain    Behavior During Therapy [facility] for tasks assessed/performed             Past Medical History:  Diagnosis Date   Allergic rhinitis    Arthritis    Asthma    Cataract    Bil/lens implant   Colon polyp 1999   small polyp   Diverticulosis    Dysrhythmia    Fibromyalgia    Hyperlipidemia    Hypothyroidism    Internal hemorrhoids    Leg cramps    Lichen planus    Lung nodule    stable LUL 9 mm   Menopausal syndrome    Osteoarthritis    UTI (urinary tract infection)    Past Surgical History:  Procedure Laterality Date   Abd U/S  11/1998   negative   Abd U/S  01/2001   gallbladder polyps   ABDOMINAL HYSTERECTOMY  1975   total-fibroids   APPENDECTOMY  1975   BREAST BIOPSY     benign   BREAST EXCISIONAL BIOPSY Left 1957   CARDIOVERSION N/A 03/07/2016   Procedure: CARDIOVERSION;  Surgeon: Yates Decamp, MD;  Location: [facility] ENDOSCOPY;  Service: Cardiovascular;  Laterality: N/A;   CATARACT EXTRACTION     bilateral   CHOLECYSTECTOMY     COLONOSCOPY  1998   Diverticulosis; polyp\   COLONOSCOPY  09/2002   Diverticulosis, hem   COLONOSCOPY  12/2007   diverticulosis, polyp   DEXA  10/2001   osteopenia   ELECTROPHYSIOLOGIC STUDY N/A 04/03/2016   Procedure: A-Flutter Ablation;  Surgeon: Will [name], MD;  Location: [facility] INVASIVE CV LAB;  Service: Cardiovascular;  Laterality: N/A;   ESOPHAGOGASTRODUODENOSCOPY  2004   Hida scan  01/2001   Negative   KNEE SURGERY     right knee / 11/2004   LAMINECTOMY WITH POSTERIOR  LATERAL ARTHRODESIS LEVEL 2 N/A 07/31/2017   Procedure: DECOMPRESSIVE LAMINECTOMY LUMABR FOUR-FIVE, LUMBAR FIVE-SACRAL ONE;  Surgeon: Tia Alert, MD;  Location: [facility] OR;  Service: Neurosurgery;  Laterality: N/A;   laser surgery for glaucoma Left 09/21/2014   MULTIPLE TOOTH EXTRACTIONS     rentinal tear     ROTATOR CUFF REPAIR     x 2 /right shoulder   SKIN CANCER EXCISION     pre-melanoma / on face   TEE WITHOUT CARDIOVERSION N/A 03/07/2016   Procedure: TRANSESOPHAGEAL ECHOCARDIOGRAM (TEE);  Surgeon: Yates Decamp, MD;  Location: [facility] ENDOSCOPY;  Service: Cardiovascular;  Laterality: N/A;   TOE SURGERY     rt foot    Patient Active Problem List   Diagnosis Date Noted   Right shoulder pain 10/23/2022   Hematoma 10/23/2022   Acute cystitis without hematuria 03/26/2022   Vaginal itching 03/26/2022   Urinary frequency 03/26/2022   Vaginal candida 03/26/2022   Chronic upper back pain 08/30/2020   Aortic atherosclerosis (HCC) 05/31/2020   Abnormal CT scan, small bowel 05/18/2020   Insomnia 12/15/2019   Chronic low back pain 10/18/2019   Osteoarthritis of right knee 08/02/2019  S/P lumbar spinal fusion 07/31/2017   H/O atrial flutter 03/06/2016   Obesity 11/06/2015   Urge incontinence 01/24/2015   Estrogen deficiency 10/25/2014   Lichen planus 12/20/2013   Prediabetes 06/03/2011   HYPERTENSION, BENIGN ESSENTIAL 10/23/2010   OSTEOARTHRITIS, HANDS, BILATERAL 01/24/2010   Hypothyroidism 08/24/2008   Vitamin D deficiency 06/06/2008   Hyperlipidemia 06/06/2008   MENOPAUSAL SYNDROME 03/29/2008   Osteoporosis 03/29/2008   Allergic rhinitis 01/25/2008   IBS 01/25/2008   OSTEOARTHRITIS 01/25/2008   FIBROMYALGIA 01/25/2008    PCP: [employer]  REFERRING PROVIDER: [name]  REFERRING DIAG: Back pain, difficulty walking, osteoporosis  Rationale for Evaluation and Treatment Rehabilitation  THERAPY DIAG:  Other low back pain  Muscle spasm of back  Difficulty in walking, not elsewhere  classified  ONSET DATE: September 2023  SUBJECTIVE:                                                                                                                                                                                           SUBJECTIVE STATEMENT: Continues to reports that the shoulder is doing okay, she still reports bad right knee, no stumbles or falls  PERTINENT HISTORY:  See above  PAIN:  Are you having pain? Yes: NPRS scale: 3/10 Pain location: knees Pain description: ache, sore Aggravating factors: yardwork, hosuework pain up to 8/10 Relieving factors: rest, pain can be 0/10 at times   PRECAUTIONS: None  WEIGHT BEARING RESTRICTIONS: No  FALLS:  Has patient fallen in last 6 months? No  LIVING ENVIRONMENT: Lives with: lives with their family and lives alone Lives in: House/apartment Stairs: No Has following equipment at home: Single point cane  OCCUPATION: retired  PLOF: Independent able to do yardwork and housework  PATIENT GOALS: walk better, less pain   OBJECTIVE:   DIAGNOSTIC FINDINGS:  osteoporosis  PATIENT SURVEYS:  FOTO 31  COGNITION:  Overall cognitive status: Within functional limits for tasks assessed     SENSATION: WFL  MUSCLE LENGTH: Tight calves, HS and piriformis  POSTURE: rounded shoulders, forward head, and decreased lumbar lordosis  PALPATION: She is tight and tender in the lumbar area and into the thoracic and upper trap area, mild tenderness in the buttocks  LUMBAR ROM:   AROM eval  Flexion Decreased 50%  Extension Decreased 100% with pain  Right lateral flexion   Left lateral flexion   Right rotation   Left rotation    (Blank rows = not tested)  LOWER EXTREMITY ROM:     Active  Right eval Left eval  Hip flexion    Hip extension    Hip abduction    Hip adduction  Hip internal rotation    Hip external rotation    Knee flexion    Knee extension    Ankle dorsiflexion    Ankle plantarflexion     Ankle inversion    Ankle eversion     (Blank rows = not tested)  LOWER EXTREMITY MMT:    MMT Right eval Left eval  Hip flexion 3+ 3+  Hip extension    Hip abduction 3+ 3+  Hip adduction    Hip internal rotation    Hip external rotation    Knee flexion 3+ with pain 3+  Knee extension 3+ with pain 3+  Ankle dorsiflexion 4- 4-  Ankle plantarflexion    Ankle inversion    Ankle eversion     (Blank rows = not tested)  FUNCTIONAL TESTS:  5 times sit to stand: 31 seconds: 10/01/22 30 seconds Timed up and go (TUG): 26 seconds with [facility]   07/25/22 TUG 22 seconds with SPC, 08/20/22 18 seconds with SPC, 10/01/22 23 seconds without device 11/05/22 with SPC 20 seconds 11/18/22 without device 16 seconds / 3 minute walk test = 350 feet  GAIT: Distance walked: 60 feet Assistive device utilized: Single point cane Level of assistance: SBA Comments: slow, slightly stooped    TODAY'S TREATMENT:  [facility] Adult PT Treatment:                                                                                                                             11/18/22 Nustep level 5 x 6 minutes TUG and 3 minute walk test as above Red tband HS curls Red tband hip abduction 2.5# hip extension and abduction 2.5# LAQ 2.5# marches Side stepping  11/13/22 Nustep level 4 x 6 minutes Gait around the parking [place] in the back [facility], HHA for curbs LAQ 2.5# 2x10 Marches 2.5# 2x10 Seated hip abduction 2.5# Standing HS curls 2.5# Standing 2.5# hip abduction On dynadisc lumbar pelvic motions Dynadisc ankle motions Supine feet on ball K2C, trunk rotation, small bridge  11/11/22 Nustel LE only level 3 x 5 minutes PROM of the right shoulder to maintain her ROM Seated on dynadisc lumbar motions and light stability 3# LAQ 3# marches 3# hip abduction and extension Seated elbow touches for ROM Side stepping Cone toe touches using SPC and light HHA  11/07/22 Nustep LE only level 3 x 6 minutes Small walk outside  up and down a curb and some uneven terrain 2.5# marches, LAQ 2.5# abduction and extension MHP/IFC to the low back  11/05/22 Nustep level 3 LE only x 5 minutes Gait outside negotiating curbs, CGA with SPC Marches, LAQ 2.5# Seated hip abduction Ball b/n knees squeeze Some walking direction changes Ice/IFC to the right shoulder in sitting  10/28/22 Nustep level 3 LE only Seated LAQ 2# Marches 2# Toe and heel raises Estim ice to the right shoulder She has ecchymosis in the right lateral and anterior shoulder, mild tenderness, biggest issue is raising the arm  up and out is very painful  PATIENT EDUCATION:  Education details: POC Person educated: Patient Education method: Explanation Education comprehension: verbalized understanding   HOME EXERCISE PROGRAM: TBD  ASSESSMENT:  CLINICAL IMPRESSION: Patient doing well, she made a very good decrease in her TUG time, I went ahead and did a 3 minute walk test, she is very slow and does have right knee pain, she does fatigue easily, she has not had a fall in a few weeks, but has a history a some falls, we will start some balance activity OBJECTIVE IMPAIRMENTS: Abnormal gait, cardiopulmonary status limiting activity, decreased activity tolerance, decreased balance, decreased endurance, decreased mobility, difficulty walking, decreased ROM, decreased strength, increased muscle spasms, impaired flexibility, postural dysfunction, and pain.    REHAB POTENTIAL: Good  CLINICAL DECISION MAKING: Stable/uncomplicated  EVALUATION COMPLEXITY: Low   GOALS: Goals reviewed with patient? Yes  SHORT TERM GOALS: Target date: 06/24/22  Independent with initial HEP Goal status: met  LONG TERM GOALS: Target date: 09/02/22  Decrease pain 50% Goal status: progressing 10/01/22  2.  Independent with an advanced HEP  Goal status:ongoing3/12/24  3.  Decrease TUG time to 13 seconds Goal status: progressing 11/05/22  4.  Increase LE strength to  4/5 Goal status: progressing 11/05/22  5.  Walk 10 minutes with SPC and without rest Goal status:  progressing 11/05/22  PLAN: PT FREQUENCY: 1-2x/week  PT DURATION: 12 weeks  PLANNED INTERVENTIONS: Therapeutic exercises, Therapeutic activity, Neuromuscular re-education, Balance training, Gait training, Patient/Family education, Self Care, Joint mobilization, Stair training, Dry Needling, Electrical stimulation, Moist heat, and Manual therapy.  PLAN FOR NEXT SESSION:  add some balance activity Jearld Lesch, PT 11/18/2022, 12:51 PM

## 2022-11-20 ENCOUNTER — Encounter: Payer: Self-pay | Admitting: Physical Therapy

## 2022-11-20 ENCOUNTER — Ambulatory Visit: Payer: Medicare Other | Admitting: Physical Therapy

## 2022-11-20 DIAGNOSIS — R262 Difficulty in walking, not elsewhere classified: Secondary | ICD-10-CM | POA: Diagnosis not present

## 2022-11-20 DIAGNOSIS — M5459 Other low back pain: Secondary | ICD-10-CM

## 2022-11-20 DIAGNOSIS — M62830 Muscle spasm of back: Secondary | ICD-10-CM | POA: Diagnosis not present

## 2022-11-20 DIAGNOSIS — M542 Cervicalgia: Secondary | ICD-10-CM | POA: Diagnosis not present

## 2022-11-20 NOTE — Therapy (Signed)
OUTPATIENT PHYSICAL THERAPY THORACOLUMBAR TREATMENT  Progress Note Reporting Period 10/08/22 to 11/20/22 for visit 21-30  See note below for Objective Data and Assessment of Progress/Goals.      Patient Name: Linda Mcgrath MRN: [id number] DOB:Mar 12, 1933, 89 y.o., female Today's Date: 11/20/2022   PT End of Session - 11/20/22 1248     Visit Number 30    Date for PT Re-Evaluation 12/06/22    Authorization Type [employer]    PT Start Time 1248    PT Stop Time 1336    PT Time Calculation (min) 48 min    Activity Tolerance Patient tolerated treatment well;Patient limited by pain    Behavior During Therapy [facility] for tasks assessed/performed             Past Medical History:  Diagnosis Date   Allergic rhinitis    Arthritis    Asthma    Cataract    Bil/lens implant   Colon polyp 1999   small polyp   Diverticulosis    Dysrhythmia    Fibromyalgia    Hyperlipidemia    Hypothyroidism    Internal hemorrhoids    Leg cramps    Lichen planus    Lung nodule    stable LUL 9 mm   Menopausal syndrome    Osteoarthritis    UTI (urinary tract infection)    Past Surgical History:  Procedure Laterality Date   Abd U/S  11/1998   negative   Abd U/S  01/2001   gallbladder polyps   ABDOMINAL HYSTERECTOMY  1975   total-fibroids   APPENDECTOMY  1975   BREAST BIOPSY     benign   BREAST EXCISIONAL BIOPSY Left 1957   CARDIOVERSION N/A 03/07/2016   Procedure: CARDIOVERSION;  Surgeon: Yates Decamp, MD;  Location: [facility] ENDOSCOPY;  Service: Cardiovascular;  Laterality: N/A;   CATARACT EXTRACTION     bilateral   CHOLECYSTECTOMY     COLONOSCOPY  1998   Diverticulosis; polyp\   COLONOSCOPY  09/2002   Diverticulosis, hem   COLONOSCOPY  12/2007   diverticulosis, polyp   DEXA  10/2001   osteopenia   ELECTROPHYSIOLOGIC STUDY N/A 04/03/2016   Procedure: A-Flutter Ablation;  Surgeon: Will [name], MD;  Location: [facility] INVASIVE CV LAB;  Service: Cardiovascular;  Laterality: N/A;    ESOPHAGOGASTRODUODENOSCOPY  2004   Hida scan  01/2001   Negative   KNEE SURGERY     right knee / 11/2004   LAMINECTOMY WITH POSTERIOR LATERAL ARTHRODESIS LEVEL 2 N/A 07/31/2017   Procedure: DECOMPRESSIVE LAMINECTOMY LUMABR FOUR-FIVE, LUMBAR FIVE-SACRAL ONE;  Surgeon: Tia Alert, MD;  Location: [facility] OR;  Service: Neurosurgery;  Laterality: N/A;   laser surgery for glaucoma Left 09/21/2014   MULTIPLE TOOTH EXTRACTIONS     rentinal tear     ROTATOR CUFF REPAIR     x 2 /right shoulder   SKIN CANCER EXCISION     pre-melanoma / on face   TEE WITHOUT CARDIOVERSION N/A 03/07/2016   Procedure: TRANSESOPHAGEAL ECHOCARDIOGRAM (TEE);  Surgeon: Yates Decamp, MD;  Location: [facility] ENDOSCOPY;  Service: Cardiovascular;  Laterality: N/A;   TOE SURGERY     rt foot    Patient Active Problem List   Diagnosis Date Noted   Right shoulder pain 10/23/2022   Hematoma 10/23/2022   Acute cystitis without hematuria 03/26/2022   Vaginal itching 03/26/2022   Urinary frequency 03/26/2022   Vaginal candida 03/26/2022   Chronic upper back pain 08/30/2020   Aortic atherosclerosis (HCC) 05/31/2020   Abnormal  CT scan, small bowel 05/18/2020   Insomnia 12/15/2019   Chronic low back pain 10/18/2019   Osteoarthritis of right knee 08/02/2019   S/P lumbar spinal fusion 07/31/2017   H/O atrial flutter 03/06/2016   Obesity 11/06/2015   Urge incontinence 01/24/2015   Estrogen deficiency 10/25/2014   Lichen planus 12/20/2013   Prediabetes 06/03/2011   HYPERTENSION, BENIGN ESSENTIAL 10/23/2010   OSTEOARTHRITIS, HANDS, BILATERAL 01/24/2010   Hypothyroidism 08/24/2008   Vitamin D deficiency 06/06/2008   Hyperlipidemia 06/06/2008   MENOPAUSAL SYNDROME 03/29/2008   Osteoporosis 03/29/2008   Allergic rhinitis 01/25/2008   IBS 01/25/2008   OSTEOARTHRITIS 01/25/2008   FIBROMYALGIA 01/25/2008    PCP: [employer]  REFERRING PROVIDER: [name]  REFERRING DIAG: Back pain, difficulty walking, osteoporosis  Rationale for  Evaluation and Treatment Rehabilitation  THERAPY DIAG:  Other low back pain  Muscle spasm of back  Difficulty in walking, not elsewhere classified  ONSET DATE: September 2023  SUBJECTIVE:                                                                                                                                                                                           SUBJECTIVE STATEMENT: I am sore and really stiff all over with the cold and damp weather.  PERTINENT HISTORY:  See above  PAIN:  Are you having pain? Yes: NPRS scale: 3/10 Pain location: knees Pain description: ache, sore Aggravating factors: yardwork, hosuework pain up to 8/10 Relieving factors: rest, pain can be 0/10 at times   PRECAUTIONS: None  WEIGHT BEARING RESTRICTIONS: No  FALLS:  Has patient fallen in last 6 months? No  LIVING ENVIRONMENT: Lives with: lives with their family and lives alone Lives in: House/apartment Stairs: No Has following equipment at home: Single point cane  OCCUPATION: retired  PLOF: Independent able to do yardwork and housework  PATIENT GOALS: walk better, less pain   OBJECTIVE:   DIAGNOSTIC FINDINGS:  osteoporosis  PATIENT SURVEYS:  FOTO 31  COGNITION:  Overall cognitive status: Within functional limits for tasks assessed     SENSATION: WFL  MUSCLE LENGTH: Tight calves, HS and piriformis  POSTURE: rounded shoulders, forward head, and decreased lumbar lordosis  PALPATION: She is tight and tender in the lumbar area and into the thoracic and upper trap area, mild tenderness in the buttocks  LUMBAR ROM:   AROM eval  Flexion Decreased 50%  Extension Decreased 100% with pain  Right lateral flexion   Left lateral flexion   Right rotation   Left rotation    (Blank rows = not tested)  LOWER EXTREMITY ROM:     Active  Right eval Left  eval  Hip flexion    Hip extension    Hip abduction    Hip adduction    Hip internal rotation    Hip  external rotation    Knee flexion    Knee extension    Ankle dorsiflexion    Ankle plantarflexion    Ankle inversion    Ankle eversion     (Blank rows = not tested)  LOWER EXTREMITY MMT:    MMT Right eval Left eval  Hip flexion 3+ 3+  Hip extension    Hip abduction 3+ 3+  Hip adduction    Hip internal rotation    Hip external rotation    Knee flexion 3+ with pain 3+  Knee extension 3+ with pain 3+  Ankle dorsiflexion 4- 4-  Ankle plantarflexion    Ankle inversion    Ankle eversion     (Blank rows = not tested)  FUNCTIONAL TESTS:  5 times sit to stand: 31 seconds: 10/01/22 30 seconds Timed up and go (TUG): 26 seconds with [employer]   07/25/22 TUG 22 seconds with SPC, 08/20/22 18 seconds with SPC, 10/01/22 23 seconds without device 11/05/22 with SPC 20 seconds 11/18/22 without device 16 seconds / 3 minute walk test = 350 feet  GAIT: Distance walked: 60 feet Assistive device utilized: Single point cane Level of assistance: SBA Comments: slow, slightly stooped    TODAY'S TREATMENT:  [facility] Adult PT Treatment:                                                                                                                             11/20/22 Red tband HS curl Red tband hip abduction Nustep level 5 x 7 minutes Ball squeeze  2.5# marches 2.5# LAQ\ Blue tband trunk extension Ball in lap isometric abs Gait 200 feet with [facility]  11/18/22 Nustep level 5 x 6 minutes TUG and 3 minute walk test as above Red tband HS curls Red tband hip abduction 2.5# hip extension and abduction 2.5# LAQ 2.5# marches Side stepping  11/13/22 Nustep level 4 x 6 minutes Gait around the parking [place] in the back [facility], HHA for curbs LAQ 2.5# 2x10 Marches 2.5# 2x10 Seated hip abduction 2.5# Standing HS curls 2.5# Standing 2.5# hip abduction On dynadisc lumbar pelvic motions Dynadisc ankle motions Supine feet on ball K2C, trunk rotation, small bridge  11/11/22 Nustel LE only level 3 x 5  minutes PROM of the right shoulder to maintain her ROM Seated on dynadisc lumbar motions and light stability 3# LAQ 3# marches 3# hip abduction and extension Seated elbow touches for ROM Side stepping Cone toe touches using SPC and light HHA  11/07/22 Nustep LE only level 3 x 6 minutes Small walk outside up and down a curb and some uneven terrain 2.5# marches, LAQ 2.5# abduction and extension MHP/IFC to the low back  11/05/22 Nustep level 3 LE only x 5 minutes Gait outside negotiating curbs, CGA with SPC [place], LAQ  2.5# Seated hip abduction Ball b/n knees squeeze Some walking direction changes Ice/IFC to the right shoulder in sitting  10/28/22 Nustep level 3 LE only Seated LAQ 2# Marches 2# Toe and heel raises Estim ice to the right shoulder She has ecchymosis in the right lateral and anterior shoulder, mild tenderness, biggest issue is raising the arm up and out is very painful  PATIENT EDUCATION:  Education details: POC Person educated: Patient Education method: Explanation Education comprehension: verbalized understanding   HOME EXERCISE PROGRAM: TBD  ASSESSMENT:  CLINICAL IMPRESSION: Patient moving slow today, reports that he cool wet weather always bothers her.  She has a little more limp today on the right leg, c/o increase knee pain.  I added the tband lumbar extension, she tolerated this well OBJECTIVE IMPAIRMENTS: Abnormal gait, cardiopulmonary status limiting activity, decreased activity tolerance, decreased balance, decreased endurance, decreased mobility, difficulty walking, decreased ROM, decreased strength, increased muscle spasms, impaired flexibility, postural dysfunction, and pain.    REHAB POTENTIAL: Good  CLINICAL DECISION MAKING: Stable/uncomplicated  EVALUATION COMPLEXITY: Low   GOALS: Goals reviewed with patient? Yes  SHORT TERM GOALS: Target date: 06/24/22  Independent with initial HEP Goal status: met  LONG TERM GOALS: Target  date: 09/02/22  Decrease pain 50% Goal status: progressing 11/20/22  2.  Independent with an advanced HEP  Goal status:ongoing3/12/24  3.  Decrease TUG time to 13 seconds Goal status: progressing 11/05/22  4.  Increase LE strength to 4/5 Goal status: progressing 11/05/22  5.  Walk 10 minutes with SPC and without rest Goal status:  progressing 11/05/22  PLAN: PT FREQUENCY: 1-2x/week  PT DURATION: 12 weeks  PLANNED INTERVENTIONS: Therapeutic exercises, Therapeutic activity, Neuromuscular re-education, Balance training, Gait training, Patient/Family education, Self Care, Joint mobilization, Stair training, Dry Needling, Electrical stimulation, Moist heat, and Manual therapy.  PLAN FOR NEXT SESSION:  add some balance activity Jearld Lesch, PT 11/20/2022, 12:49 PM

## 2022-12-03 ENCOUNTER — Ambulatory Visit: Payer: Medicare Other | Attending: Family Medicine | Admitting: Physical Therapy

## 2022-12-03 ENCOUNTER — Encounter: Payer: Self-pay | Admitting: Physical Therapy

## 2022-12-03 DIAGNOSIS — M542 Cervicalgia: Secondary | ICD-10-CM | POA: Diagnosis not present

## 2022-12-03 DIAGNOSIS — M62830 Muscle spasm of back: Secondary | ICD-10-CM | POA: Diagnosis not present

## 2022-12-03 DIAGNOSIS — M5459 Other low back pain: Secondary | ICD-10-CM | POA: Insufficient documentation

## 2022-12-03 DIAGNOSIS — R262 Difficulty in walking, not elsewhere classified: Secondary | ICD-10-CM

## 2022-12-03 NOTE — Therapy (Signed)
OUTPATIENT PHYSICAL THERAPY THORACOLUMBAR TREATMENT  Progress Note Reporting Period 10/08/22 to 11/20/22 for visit 21-30  See note below for Objective Data and Assessment of Progress/Goals.      Patient Name: Linda Mcgrath MRN: [id number] DOB:Jan 15, 1933, 89 y.o., female Today's Date: 12/03/2022   PT End of Session - 12/03/22 1616     Visit Number 31    Date for PT Re-Evaluation 12/06/22    Authorization Type [employer]    PT Start Time 1614    PT Stop Time 1700    PT Time Calculation (min) 46 min    Activity Tolerance Patient tolerated treatment well;Patient limited by pain    Behavior During Therapy [facility] for tasks assessed/performed             Past Medical History:  Diagnosis Date   Allergic rhinitis    Arthritis    Asthma    Cataract    Bil/lens implant   Colon polyp 1999   small polyp   Diverticulosis    Dysrhythmia    Fibromyalgia    Hyperlipidemia    Hypothyroidism    Internal hemorrhoids    Leg cramps    Lichen planus    Lung nodule    stable LUL 9 mm   Menopausal syndrome    Osteoarthritis    UTI (urinary tract infection)    Past Surgical History:  Procedure Laterality Date   Abd U/S  11/1998   negative   Abd U/S  01/2001   gallbladder polyps   ABDOMINAL HYSTERECTOMY  1975   total-fibroids   APPENDECTOMY  1975   BREAST BIOPSY     benign   BREAST EXCISIONAL BIOPSY Left 1957   CARDIOVERSION N/A 03/07/2016   Procedure: CARDIOVERSION;  Surgeon: Yates Decamp, MD;  Location: [facility] ENDOSCOPY;  Service: Cardiovascular;  Laterality: N/A;   CATARACT EXTRACTION     bilateral   CHOLECYSTECTOMY     COLONOSCOPY  1998   Diverticulosis; polyp\   COLONOSCOPY  09/2002   Diverticulosis, hem   COLONOSCOPY  12/2007   diverticulosis, polyp   DEXA  10/2001   osteopenia   ELECTROPHYSIOLOGIC STUDY N/A 04/03/2016   Procedure: A-Flutter Ablation;  Surgeon: Will [name], MD;  Location: [facility] INVASIVE CV LAB;  Service: Cardiovascular;  Laterality: N/A;    ESOPHAGOGASTRODUODENOSCOPY  2004   Hida scan  01/2001   Negative   KNEE SURGERY     right knee / 11/2004   LAMINECTOMY WITH POSTERIOR LATERAL ARTHRODESIS LEVEL 2 N/A 07/31/2017   Procedure: DECOMPRESSIVE LAMINECTOMY LUMABR FOUR-FIVE, LUMBAR FIVE-SACRAL ONE;  Surgeon: Tia Alert, MD;  Location: [facility] OR;  Service: Neurosurgery;  Laterality: N/A;   laser surgery for glaucoma Left 09/21/2014   MULTIPLE TOOTH EXTRACTIONS     rentinal tear     ROTATOR CUFF REPAIR     x 2 /right shoulder   SKIN CANCER EXCISION     pre-melanoma / on face   TEE WITHOUT CARDIOVERSION N/A 03/07/2016   Procedure: TRANSESOPHAGEAL ECHOCARDIOGRAM (TEE);  Surgeon: Yates Decamp, MD;  Location: [facility] ENDOSCOPY;  Service: Cardiovascular;  Laterality: N/A;   TOE SURGERY     rt foot    Patient Active Problem List   Diagnosis Date Noted   Right shoulder pain 10/23/2022   Hematoma 10/23/2022   Acute cystitis without hematuria 03/26/2022   Vaginal itching 03/26/2022   Urinary frequency 03/26/2022   Vaginal candida 03/26/2022   Chronic upper back pain 08/30/2020   Aortic atherosclerosis 05/31/2020   Abnormal CT  scan, small bowel 05/18/2020   Insomnia 12/15/2019   Chronic low back pain 10/18/2019   Osteoarthritis of right knee 08/02/2019   S/P lumbar spinal fusion 07/31/2017   H/O atrial flutter 03/06/2016   Obesity 11/06/2015   Urge incontinence 01/24/2015   Estrogen deficiency 10/25/2014   Lichen planus 12/20/2013   Prediabetes 06/03/2011   HYPERTENSION, BENIGN ESSENTIAL 10/23/2010   OSTEOARTHRITIS, HANDS, BILATERAL 01/24/2010   Hypothyroidism 08/24/2008   Vitamin D deficiency 06/06/2008   Hyperlipidemia 06/06/2008   MENOPAUSAL SYNDROME 03/29/2008   Osteoporosis 03/29/2008   Allergic rhinitis 01/25/2008   IBS 01/25/2008   OSTEOARTHRITIS 01/25/2008   FIBROMYALGIA 01/25/2008    PCP: [employer]  REFERRING PROVIDER: [name]  REFERRING DIAG: Back pain, difficulty walking, osteoporosis  Rationale for Evaluation and  Treatment Rehabilitation  THERAPY DIAG:  Other low back pain  Muscle spasm of back  Difficulty in walking, not elsewhere classified  Cervicalgia  ONSET DATE: September 2023  SUBJECTIVE:                                                                                                                                                                                           SUBJECTIVE STATEMENT: I am sore and really stiff all over with the cold and damp weather.  Continues to report aches and pains and stiffness, definitely moves slower than her normal  PERTINENT HISTORY:  See above  PAIN:  Are you having pain? Yes: NPRS scale: 3/10 Pain location: knees Pain description: ache, sore Aggravating factors: yardwork, hosuework pain up to 8/10 Relieving factors: rest, pain can be 0/10 at times   PRECAUTIONS: None  WEIGHT BEARING RESTRICTIONS: No  FALLS:  Has patient fallen in last 6 months? No  LIVING ENVIRONMENT: Lives with: lives with their family and lives alone Lives in: House/apartment Stairs: No Has following equipment at home: Single point cane  OCCUPATION: retired  PLOF: Independent able to do yardwork and housework  PATIENT GOALS: walk better, less pain   OBJECTIVE:   DIAGNOSTIC FINDINGS:  osteoporosis  PATIENT SURVEYS:  FOTO 31  COGNITION:  Overall cognitive status: Within functional limits for tasks assessed     SENSATION: WFL  MUSCLE LENGTH: Tight calves, HS and piriformis  POSTURE: rounded shoulders, forward head, and decreased lumbar lordosis  PALPATION: She is tight and tender in the lumbar area and into the thoracic and upper trap area, mild tenderness in the buttocks  LUMBAR ROM:   AROM eval  Flexion Decreased 50%  Extension Decreased 100% with pain  Right lateral flexion   Left lateral flexion   Right rotation   Left rotation    (Blank rows =  not tested)  LOWER EXTREMITY ROM:     Active  Right eval Left eval  Hip flexion     Hip extension    Hip abduction    Hip adduction    Hip internal rotation    Hip external rotation    Knee flexion    Knee extension    Ankle dorsiflexion    Ankle plantarflexion    Ankle inversion    Ankle eversion     (Blank rows = not tested)  LOWER EXTREMITY MMT:    MMT Right eval Left eval  Hip flexion 3+ 3+  Hip extension    Hip abduction 3+ 3+  Hip adduction    Hip internal rotation    Hip external rotation    Knee flexion 3+ with pain 3+  Knee extension 3+ with pain 3+  Ankle dorsiflexion 4- 4-  Ankle plantarflexion    Ankle inversion    Ankle eversion     (Blank rows = not tested)  FUNCTIONAL TESTS:  5 times sit to stand: 31 seconds: 10/01/22 30 seconds Timed up and go (TUG): 26 seconds with [facility]   07/25/22 TUG 22 seconds with SPC, 08/20/22 18 seconds with SPC, 10/01/22 23 seconds without device 11/05/22 with SPC 20 seconds 11/18/22 without device 16 seconds / 3 minute walk test = 350 feet  GAIT: Distance walked: 60 feet Assistive device utilized: Single point cane Level of assistance: SBA Comments: slow, slightly stooped    TODAY'S TREATMENT:  [facility] Adult PT Treatment:                                                                                                                             12/03/22 Red tband HS curl, ankle motions all directions Red tband clamshell Nustep level 5 x 7 minutes Discussed her goals and needs see clinical impression below 2# LAQ 2# marches Ball b/n knees squeeze Ball in lap isometrics Blue band trunk extension  11/20/22 Red tband HS curl Red tband hip abduction Nustep level 5 x 7 minutes Ball squeeze  2.5# marches 2.5# LAQ\ Blue tband trunk extension Ball in lap isometric abs Gait 200 feet with [facility]  11/18/22 Nustep level 5 x 6 minutes TUG and 3 minute walk test as above Red tband HS curls Red tband hip abduction 2.5# hip extension and abduction 2.5# LAQ 2.5# marches Side stepping  11/13/22 Nustep level 4 x 6  minutes Gait around the parking [place] in the back [employer], HHA for curbs LAQ 2.5# 2x10 Marches 2.5# 2x10 Seated hip abduction 2.5# Standing HS curls 2.5# Standing 2.5# hip abduction On dynadisc lumbar pelvic motions Dynadisc ankle motions Supine feet on ball K2C, trunk rotation, small bridge  11/11/22 Nustel LE only level 3 x 5 minutes PROM of the right shoulder to maintain her ROM Seated on dynadisc lumbar motions and light stability 3# LAQ 3# marches 3# hip abduction and extension Seated elbow touches for ROM Side stepping Cone toe  touches using SPC and light HHA  11/07/22 Nustep LE only level 3 x 6 minutes Small walk outside up and down a curb and some uneven terrain 2.5# marches, LAQ 2.5# abduction and extension MHP/IFC to the low back  11/05/22 Nustep level 3 LE only x 5 minutes Gait outside negotiating curbs, CGA with SPC Marches, LAQ 2.5# Seated hip abduction Ball b/n knees squeeze Some walking direction changes Ice/IFC to the right shoulder in sitting  10/28/22 Nustep level 3 LE only Seated LAQ 2# Marches 2# Toe and heel raises Estim ice to the right shoulder She has ecchymosis in the right lateral and anterior shoulder, mild tenderness, biggest issue is raising the arm up and out is very painful  PATIENT EDUCATION:  Education details: POC Person educated: Patient Education method: Explanation Education comprehension: verbalized understanding   HOME EXERCISE PROGRAM: TBD  ASSESSMENT:  CLINICAL IMPRESSION: Patient continues moving slow today, reports that he cool wet weather always bothers her.  We discussed her goals.  She used to do some pick up sticks int he yard and also reports that she cannot lift and carry a half gallon of milk.  She was doing this but reports has not been able to do this in the past 6 months.  The weather and some other issues have caused her lately to be very slow in progressing toward her goals OBJECTIVE IMPAIRMENTS: Abnormal gait,  cardiopulmonary status limiting activity, decreased activity tolerance, decreased balance, decreased endurance, decreased mobility, difficulty walking, decreased ROM, decreased strength, increased muscle spasms, impaired flexibility, postural dysfunction, and pain.    REHAB POTENTIAL: Good  CLINICAL DECISION MAKING: Stable/uncomplicated  EVALUATION COMPLEXITY: Low   GOALS: Goals reviewed with patient? Yes  SHORT TERM GOALS: Target date: 06/24/22  Independent with initial HEP Goal status: met  LONG TERM GOALS: Target date: 09/02/22  Decrease pain 50% Goal status: progressing 11/20/22  2.  Independent with an advanced HEP  Goal status:ongoing3/12/24  3.  Decrease TUG time to 13 seconds Goal status: progressing 11/05/22  4.  Increase LE strength to 4/5 Goal status: progressing 11/05/22  5.  Walk 10 minutes with SPC and without rest Goal status:  progressing 11/05/22  PLAN: PT FREQUENCY: 1-2x/week  PT DURATION: 12 weeks  PLANNED INTERVENTIONS: Therapeutic exercises, Therapeutic activity, Neuromuscular re-education, Balance training, Gait training, Patient/Family education, Self Care, Joint mobilization, Stair training, Dry Needling, Electrical stimulation, Moist heat, and Manual therapy.  PLAN FOR NEXT SESSION:  add some balance activity Jearld Lesch, PT 12/03/2022, 4:23 PM

## 2022-12-05 ENCOUNTER — Ambulatory Visit: Payer: Medicare Other | Admitting: Physical Therapy

## 2022-12-05 ENCOUNTER — Encounter: Payer: Self-pay | Admitting: Physical Therapy

## 2022-12-05 DIAGNOSIS — R262 Difficulty in walking, not elsewhere classified: Secondary | ICD-10-CM

## 2022-12-05 DIAGNOSIS — M542 Cervicalgia: Secondary | ICD-10-CM

## 2022-12-05 DIAGNOSIS — M62830 Muscle spasm of back: Secondary | ICD-10-CM | POA: Diagnosis not present

## 2022-12-05 DIAGNOSIS — M5459 Other low back pain: Secondary | ICD-10-CM | POA: Diagnosis not present

## 2022-12-05 NOTE — Therapy (Signed)
OUTPATIENT PHYSICAL THERAPY THORACOLUMBAR TREATMENT   Patient Name: Linda Mcgrath MRN: [id number] DOB:December 24, 1932, 89 y.o., female Today's Date: 12/05/2022   PT End of Session - 12/05/22 1016     Visit Number 32    Date for PT Re-Evaluation 12/06/22    Authorization Type [employer]    PT Start Time 1010    PT Stop Time 1055    PT Time Calculation (min) 45 min    Activity Tolerance Patient tolerated treatment well;Patient limited by pain    Behavior During Therapy [facility] for tasks assessed/performed             Past Medical History:  Diagnosis Date   Allergic rhinitis    Arthritis    Asthma    Cataract    Bil/lens implant   Colon polyp 1999   small polyp   Diverticulosis    Dysrhythmia    Fibromyalgia    Hyperlipidemia    Hypothyroidism    Internal hemorrhoids    Leg cramps    Lichen planus    Lung nodule    stable LUL 9 mm   Menopausal syndrome    Osteoarthritis    UTI (urinary tract infection)    Past Surgical History:  Procedure Laterality Date   Abd U/S  11/1998   negative   Abd U/S  01/2001   gallbladder polyps   ABDOMINAL HYSTERECTOMY  1975   total-fibroids   APPENDECTOMY  1975   BREAST BIOPSY     benign   BREAST EXCISIONAL BIOPSY Left 1957   CARDIOVERSION N/A 03/07/2016   Procedure: CARDIOVERSION;  Surgeon: Yates Decamp, MD;  Location: [facility] ENDOSCOPY;  Service: Cardiovascular;  Laterality: N/A;   CATARACT EXTRACTION     bilateral   CHOLECYSTECTOMY     COLONOSCOPY  1998   Diverticulosis; polyp\   COLONOSCOPY  09/2002   Diverticulosis, hem   COLONOSCOPY  12/2007   diverticulosis, polyp   DEXA  10/2001   osteopenia   ELECTROPHYSIOLOGIC STUDY N/A 04/03/2016   Procedure: A-Flutter Ablation;  Surgeon: Will [name], MD;  Location: [facility] INVASIVE CV LAB;  Service: Cardiovascular;  Laterality: N/A;   ESOPHAGOGASTRODUODENOSCOPY  2004   Hida scan  01/2001   Negative   KNEE SURGERY     right knee / 11/2004   LAMINECTOMY WITH POSTERIOR  LATERAL ARTHRODESIS LEVEL 2 N/A 07/31/2017   Procedure: DECOMPRESSIVE LAMINECTOMY LUMABR FOUR-FIVE, LUMBAR FIVE-SACRAL ONE;  Surgeon: Tia Alert, MD;  Location: [facility] OR;  Service: Neurosurgery;  Laterality: N/A;   laser surgery for glaucoma Left 09/21/2014   MULTIPLE TOOTH EXTRACTIONS     rentinal tear     ROTATOR CUFF REPAIR     x 2 /right shoulder   SKIN CANCER EXCISION     pre-melanoma / on face   TEE WITHOUT CARDIOVERSION N/A 03/07/2016   Procedure: TRANSESOPHAGEAL ECHOCARDIOGRAM (TEE);  Surgeon: Yates Decamp, MD;  Location: [facility] ENDOSCOPY;  Service: Cardiovascular;  Laterality: N/A;   TOE SURGERY     rt foot    Patient Active Problem List   Diagnosis Date Noted   Right shoulder pain 10/23/2022   Hematoma 10/23/2022   Acute cystitis without hematuria 03/26/2022   Vaginal itching 03/26/2022   Urinary frequency 03/26/2022   Vaginal candida 03/26/2022   Chronic upper back pain 08/30/2020   Aortic atherosclerosis 05/31/2020   Abnormal CT scan, small bowel 05/18/2020   Insomnia 12/15/2019   Chronic low back pain 10/18/2019   Osteoarthritis of right knee 08/02/2019   S/P  lumbar spinal fusion 07/31/2017   H/O atrial flutter 03/06/2016   Obesity 11/06/2015   Urge incontinence 01/24/2015   Estrogen deficiency 10/25/2014   Lichen planus 12/20/2013   Prediabetes 06/03/2011   HYPERTENSION, BENIGN ESSENTIAL 10/23/2010   OSTEOARTHRITIS, HANDS, BILATERAL 01/24/2010   Hypothyroidism 08/24/2008   Vitamin D deficiency 06/06/2008   Hyperlipidemia 06/06/2008   MENOPAUSAL SYNDROME 03/29/2008   Osteoporosis 03/29/2008   Allergic rhinitis 01/25/2008   IBS 01/25/2008   OSTEOARTHRITIS 01/25/2008   FIBROMYALGIA 01/25/2008    PCP: [employer]  REFERRING PROVIDER: [name]  REFERRING DIAG: Back pain, difficulty walking, osteoporosis  Rationale for Evaluation and Treatment Rehabilitation  THERAPY DIAG:  Other low back pain  Muscle spasm of back  Difficulty in walking, not elsewhere  classified  Cervicalgia  ONSET DATE: September 2023  SUBJECTIVE:                                                                                                                                                                                           SUBJECTIVE STATEMENT: Doing okay, the weather really makes me stiff and I don't move as well. PERTINENT HISTORY:  See above  PAIN:  Are you having pain? Yes: NPRS scale: 3/10 Pain location: knees Pain description: ache, sore Aggravating factors: yardwork, hosuework pain up to 8/10 Relieving factors: rest, pain can be 0/10 at times   PRECAUTIONS: None  WEIGHT BEARING RESTRICTIONS: No  FALLS:  Has patient fallen in last 6 months? No  LIVING ENVIRONMENT: Lives with: lives with their family and lives alone Lives in: House/apartment Stairs: No Has following equipment at home: Single point cane  OCCUPATION: retired  PLOF: Independent able to do yardwork and housework  PATIENT GOALS: walk better, less pain   OBJECTIVE:   DIAGNOSTIC FINDINGS:  osteoporosis  PATIENT SURVEYS:  FOTO 31  COGNITION:  Overall cognitive status: Within functional limits for tasks assessed     SENSATION: WFL  MUSCLE LENGTH: Tight calves, HS and piriformis  POSTURE: rounded shoulders, forward head, and decreased lumbar lordosis  PALPATION: She is tight and tender in the lumbar area and into the thoracic and upper trap area, mild tenderness in the buttocks  LUMBAR ROM:   AROM eval  Flexion Decreased 50%  Extension Decreased 100% with pain  Right lateral flexion   Left lateral flexion   Right rotation   Left rotation    (Blank rows = not tested)  LOWER EXTREMITY ROM:     Active  Right eval Left eval  Hip flexion    Hip extension    Hip abduction    Hip adduction    Hip internal rotation  Hip external rotation    Knee flexion    Knee extension    Ankle dorsiflexion    Ankle plantarflexion    Ankle inversion    Ankle  eversion     (Blank rows = not tested)  LOWER EXTREMITY MMT:    MMT Right eval Left eval  Hip flexion 3+ 3+  Hip extension    Hip abduction 3+ 3+  Hip adduction    Hip internal rotation    Hip external rotation    Knee flexion 3+ with pain 3+  Knee extension 3+ with pain 3+  Ankle dorsiflexion 4- 4-  Ankle plantarflexion    Ankle inversion    Ankle eversion     (Blank rows = not tested)  FUNCTIONAL TESTS:  5 times sit to stand: 31 seconds: 10/01/22 30 seconds Timed up and go (TUG): 26 seconds with [facility]   07/25/22 TUG 22 seconds with SPC, 08/20/22 18 seconds with SPC, 10/01/22 23 seconds without device 11/05/22 with SPC 20 seconds 11/18/22 without device 16 seconds / 3 minute walk test = 350 feet  GAIT: Distance walked: 60 feet Assistive device utilized: Single point cane Level of assistance: SBA Comments: slow, slightly stooped    TODAY'S TREATMENT:  [facility] Adult PT Treatment:                                                                                                                             12/05/22 Nustep level 5 x 7 minutes Red tband scapular stabilization 2 ways Red tband HS curls Standing 2# hip abduction Standing hip marches 2# Gait in clinic 240 feet without rest SBA with SPC, very slow 2# LAQ 3x10 Blue tband back extension Side stepping  12/03/22 Red tband HS curl, ankle motions all directions Red tband clamshell Nustep level 5 x 7 minutes Discussed her goals and needs see clinical impression below 2# LAQ 2# marches Ball b/n knees squeeze Ball in lap isometrics Blue band trunk extension  11/20/22 Red tband HS curl Red tband hip abduction Nustep level 5 x 7 minutes Ball squeeze  2.5# marches 2.5# LAQ\ Blue tband trunk extension Ball in lap isometric abs Gait 200 feet with [facility]  11/18/22 Nustep level 5 x 6 minutes TUG and 3 minute walk test as above Red tband HS curls Red tband hip abduction 2.5# hip extension and abduction 2.5# LAQ 2.5#  marches Side stepping  11/13/22 Nustep level 4 x 6 minutes Gait around the parking [place] in the back [facility], HHA for curbs LAQ 2.5# 2x10 Marches 2.5# 2x10 Seated hip abduction 2.5# Standing HS curls 2.5# Standing 2.5# hip abduction On dynadisc lumbar pelvic motions Dynadisc ankle motions Supine feet on ball K2C, trunk rotation, small bridge  11/11/22 Nustel LE only level 3 x 5 minutes PROM of the right shoulder to maintain her ROM Seated on dynadisc lumbar motions and light stability 3# LAQ 3# marches 3# hip abduction and extension Seated elbow touches for ROM  Side stepping Cone toe touches using SPC and light HHA  11/07/22 Nustep LE only level 3 x 6 minutes Small walk outside up and down a curb and some uneven terrain 2.5# marches, LAQ 2.5# abduction and extension MHP/IFC to the low back  11/05/22 Nustep level 3 LE only x 5 minutes Gait outside negotiating curbs, CGA with SPC Marches, LAQ 2.5# Seated hip abduction Ball b/n knees squeeze Some walking direction changes Ice/IFC to the right shoulder in sitting  10/28/22 Nustep level 3 LE only Seated LAQ 2# Marches 2# Toe and heel raises Estim ice to the right shoulder She has ecchymosis in the right lateral and anterior shoulder, mild tenderness, biggest issue is raising the arm up and out is very painful  PATIENT EDUCATION:  Education details: POC Person educated: Patient Education method: Explanation Education comprehension: verbalized understanding   HOME EXERCISE PROGRAM: TBD  ASSESSMENT:  CLINICAL IMPRESSION: Patient continues moving slow today, reports that he cool wet weather always bothers her. She c/o some knee pain more today with the exercises and the walking.  We talked about our progress and next visit we will assess all the functional tests that I can to see about renewal and continuation of therapy, she feels like this is really helping her say active and living alone OBJECTIVE IMPAIRMENTS:  Abnormal gait, cardiopulmonary status limiting activity, decreased activity tolerance, decreased balance, decreased endurance, decreased mobility, difficulty walking, decreased ROM, decreased strength, increased muscle spasms, impaired flexibility, postural dysfunction, and pain.    REHAB POTENTIAL: Good  CLINICAL DECISION MAKING: Stable/uncomplicated  EVALUATION COMPLEXITY: Low   GOALS: Goals reviewed with patient? Yes  SHORT TERM GOALS: Target date: 06/24/22  Independent with initial HEP Goal status: met  LONG TERM GOALS: Target date: 09/02/22  Decrease pain 50% Goal status: progressing 11/20/22  2.  Independent with an advanced HEP  Goal status:ongoing3/12/24  3.  Decrease TUG time to 13 seconds Goal status: progressing 11/05/22  4.  Increase LE strength to 4/5 Goal status: progressing 11/05/22  5.  Walk 10 minutes with SPC and without rest Goal status:  progressing 11/05/22  PLAN: PT FREQUENCY: 1-2x/week  PT DURATION: 12 weeks  PLANNED INTERVENTIONS: Therapeutic exercises, Therapeutic activity, Neuromuscular re-education, Balance training, Gait training, Patient/Family education, Self Care, Joint mobilization, Stair training, Dry Needling, Electrical stimulation, Moist heat, and Manual therapy.  PLAN FOR NEXT SESSION:  renewal Jearld Lesch, PT 12/05/2022, 10:17 AM

## 2022-12-10 ENCOUNTER — Ambulatory Visit: Payer: Medicare Other | Admitting: Physical Therapy

## 2022-12-10 ENCOUNTER — Encounter: Payer: Self-pay | Admitting: Physical Therapy

## 2022-12-10 DIAGNOSIS — M542 Cervicalgia: Secondary | ICD-10-CM | POA: Diagnosis not present

## 2022-12-10 DIAGNOSIS — M5459 Other low back pain: Secondary | ICD-10-CM

## 2022-12-10 DIAGNOSIS — M62830 Muscle spasm of back: Secondary | ICD-10-CM | POA: Diagnosis not present

## 2022-12-10 DIAGNOSIS — R262 Difficulty in walking, not elsewhere classified: Secondary | ICD-10-CM | POA: Diagnosis not present

## 2022-12-10 NOTE — Therapy (Signed)
OUTPATIENT PHYSICAL THERAPY THORACOLUMBAR TREATMENT   Patient Name: Linda Mcgrath MRN: [id number] DOB:01-13-1933, 89 y.o., female Today's Date: 12/10/2022   PT End of Session - 12/10/22 1259     Visit Number 33    Date for PT Re-Evaluation 01/09/23    Authorization Type [employer]    PT Start Time 1256    PT Stop Time 1345    PT Time Calculation (min) 49 min    Activity Tolerance Patient tolerated treatment well;Patient limited by pain    Behavior During Therapy [facility] for tasks assessed/performed             Past Medical History:  Diagnosis Date   Allergic rhinitis    Arthritis    Asthma    Cataract    Bil/lens implant   Colon polyp 1999   small polyp   Diverticulosis    Dysrhythmia    Fibromyalgia    Hyperlipidemia    Hypothyroidism    Internal hemorrhoids    Leg cramps    Lichen planus    Lung nodule    stable LUL 9 mm   Menopausal syndrome    Osteoarthritis    UTI (urinary tract infection)    Past Surgical History:  Procedure Laterality Date   Abd U/S  11/1998   negative   Abd U/S  01/2001   gallbladder polyps   ABDOMINAL HYSTERECTOMY  1975   total-fibroids   APPENDECTOMY  1975   BREAST BIOPSY     benign   BREAST EXCISIONAL BIOPSY Left 1957   CARDIOVERSION N/A 03/07/2016   Procedure: CARDIOVERSION;  Surgeon: Yates Decamp, MD;  Location: [facility] ENDOSCOPY;  Service: Cardiovascular;  Laterality: N/A;   CATARACT EXTRACTION     bilateral   CHOLECYSTECTOMY     COLONOSCOPY  1998   Diverticulosis; polyp\   COLONOSCOPY  09/2002   Diverticulosis, hem   COLONOSCOPY  12/2007   diverticulosis, polyp   DEXA  10/2001   osteopenia   ELECTROPHYSIOLOGIC STUDY N/A 04/03/2016   Procedure: A-Flutter Ablation;  Surgeon: Will [name], MD;  Location: [facility] INVASIVE CV LAB;  Service: Cardiovascular;  Laterality: N/A;   ESOPHAGOGASTRODUODENOSCOPY  2004   Hida scan  01/2001   Negative   KNEE SURGERY     right knee / 11/2004   LAMINECTOMY WITH POSTERIOR  LATERAL ARTHRODESIS LEVEL 2 N/A 07/31/2017   Procedure: DECOMPRESSIVE LAMINECTOMY LUMABR FOUR-FIVE, LUMBAR FIVE-SACRAL ONE;  Surgeon: Tia Alert, MD;  Location: [facility] OR;  Service: Neurosurgery;  Laterality: N/A;   laser surgery for glaucoma Left 09/21/2014   MULTIPLE TOOTH EXTRACTIONS     rentinal tear     ROTATOR CUFF REPAIR     x 2 /right shoulder   SKIN CANCER EXCISION     pre-melanoma / on face   TEE WITHOUT CARDIOVERSION N/A 03/07/2016   Procedure: TRANSESOPHAGEAL ECHOCARDIOGRAM (TEE);  Surgeon: Yates Decamp, MD;  Location: [facility] ENDOSCOPY;  Service: Cardiovascular;  Laterality: N/A;   TOE SURGERY     rt foot    Patient Active Problem List   Diagnosis Date Noted   Right shoulder pain 10/23/2022   Hematoma 10/23/2022   Acute cystitis without hematuria 03/26/2022   Vaginal itching 03/26/2022   Urinary frequency 03/26/2022   Vaginal candida 03/26/2022   Chronic upper back pain 08/30/2020   Aortic atherosclerosis 05/31/2020   Abnormal CT scan, small bowel 05/18/2020   Insomnia 12/15/2019   Chronic low back pain 10/18/2019   Osteoarthritis of right knee 08/02/2019   S/P  lumbar spinal fusion 07/31/2017   H/O atrial flutter 03/06/2016   Obesity 11/06/2015   Urge incontinence 01/24/2015   Estrogen deficiency 10/25/2014   Lichen planus 12/20/2013   Prediabetes 06/03/2011   HYPERTENSION, BENIGN ESSENTIAL 10/23/2010   OSTEOARTHRITIS, HANDS, BILATERAL 01/24/2010   Hypothyroidism 08/24/2008   Vitamin D deficiency 06/06/2008   Hyperlipidemia 06/06/2008   MENOPAUSAL SYNDROME 03/29/2008   Osteoporosis 03/29/2008   Allergic rhinitis 01/25/2008   IBS 01/25/2008   OSTEOARTHRITIS 01/25/2008   FIBROMYALGIA 01/25/2008    PCP: [employer]  REFERRING PROVIDER: [name]  REFERRING DIAG: Back pain, difficulty walking, osteoporosis  Rationale for Evaluation and Treatment Rehabilitation  THERAPY DIAG:  Other low back pain  Muscle spasm of back  Difficulty in walking, not elsewhere  classified  ONSET DATE: September 2023  SUBJECTIVE:                                                                                                                                                                                           SUBJECTIVE STATEMENT: My knees are sore and tired, she says she would like to get out in the yard and pick up some sticks PERTINENT HISTORY:  See above  PAIN:  Are you having pain? Yes: NPRS scale: 3/10 Pain location: knees Pain description: ache, sore Aggravating factors: yardwork, hosuework pain up to 8/10 Relieving factors: rest, pain can be 0/10 at times   PRECAUTIONS: None  WEIGHT BEARING RESTRICTIONS: No  FALLS:  Has patient fallen in last 6 months? No  LIVING ENVIRONMENT: Lives with: lives with their family and lives alone Lives in: House/apartment Stairs: No Has following equipment at home: Single point cane  OCCUPATION: retired  PLOF: Independent able to do yardwork and housework  PATIENT GOALS: walk better, less pain   OBJECTIVE:   DIAGNOSTIC FINDINGS:  osteoporosis  PATIENT SURVEYS:  FOTO 31  COGNITION:  Overall cognitive status: Within functional limits for tasks assessed     SENSATION: WFL  MUSCLE LENGTH: Tight calves, HS and piriformis  POSTURE: rounded shoulders, forward head, and decreased lumbar lordosis  PALPATION: She is tight and tender in the lumbar area and into the thoracic and upper trap area, mild tenderness in the buttocks  LUMBAR ROM:   AROM eval  Flexion Decreased 50%  Extension Decreased 100% with pain  Right lateral flexion   Left lateral flexion   Right rotation   Left rotation    (Blank rows = not tested)  LOWER EXTREMITY ROM:     Active  Right eval Left eval  Hip flexion    Hip extension    Hip abduction    Hip adduction  Hip internal rotation    Hip external rotation    Knee flexion    Knee extension    Ankle dorsiflexion    Ankle plantarflexion    Ankle  inversion    Ankle eversion     (Blank rows = not tested)  LOWER EXTREMITY MMT:    MMT Right eval Left eval Left /Right 12/10/22  Hip flexion 3+ 3+ 4-/4-  Hip extension     Hip abduction 3+ 3+ 4-/4-  Hip adduction     Hip internal rotation     Hip external rotation     Knee flexion 3+ with pain 3+ 4-/4-  Knee extension 3+ with pain 3+ 4+/4+  Ankle dorsiflexion 4- 4-   Ankle plantarflexion     Ankle inversion     Ankle eversion      (Blank rows = not tested)  FUNCTIONAL TESTS:  5 times sit to stand: 31 seconds: 10/01/22 30 seconds Timed up and go (TUG): 26 seconds with [facility]   07/25/22 TUG 22 seconds with SPC, 08/20/22 18 seconds with SPC, 10/01/22 23 seconds without device 11/05/22 with SPC 20 seconds 11/18/22 without device 16 seconds / 3 minute walk test = 350 feet 12/10/22 TUG no device 15 seconds / 3 minute walk test 375' Berg balance test:  35/56  GAIT: Distance walked: 60 feet Assistive device utilized: Single point cane Level of assistance: SBA Comments: slow, slightly stooped    TODAY'S TREATMENT:  [facility] Adult PT Treatment:                                                                                                                             12/10/22 Nustep level 5 x 7 minutes On airex balance activities, reaching, head turns, weight shift, marches, cone toe touches with SPC and some HHA to at times CGA due to LOB Assessed Berg, TUG and 3 minute walk tests Side stepping, backward walking  12/05/22 Nustep level 5 x 7 minutes Red tband scapular stabilization 2 ways Red tband HS curls Standing 2# hip abduction Standing hip marches 2# Gait in clinic 240 feet without rest SBA with SPC, very slow 2# LAQ 3x10 Blue tband back extension Side stepping  12/03/22 Red tband HS curl, ankle motions all directions Red tband clamshell Nustep level 5 x 7 minutes Discussed her goals and needs see clinical impression below 2# LAQ 2# marches Ball b/n knees squeeze Ball in  lap isometrics Blue band trunk extension  11/20/22 Red tband HS curl Red tband hip abduction Nustep level 5 x 7 minutes Ball squeeze  2.5# marches 2.5# LAQ\ Blue tband trunk extension Ball in lap isometric abs Gait 200 feet with [facility]  11/18/22 Nustep level 5 x 6 minutes TUG and 3 minute walk test as above Red tband HS curls Red tband hip abduction 2.5# hip extension and abduction 2.5# LAQ 2.5# marches Side stepping  11/13/22 Nustep level 4 x 6 minutes Gait around the parking [place]  in the back [facility], HHA for curbs LAQ 2.5# 2x10 Marches 2.5# 2x10 Seated hip abduction 2.5# Standing HS curls 2.5# Standing 2.5# hip abduction On dynadisc lumbar pelvic motions Dynadisc ankle motions Supine feet on ball K2C, trunk rotation, small bridge  11/11/22 Nustel LE only level 3 x 5 minutes PROM of the right shoulder to maintain her ROM Seated on dynadisc lumbar motions and light stability 3# LAQ 3# marches 3# hip abduction and extension Seated elbow touches for ROM Side stepping Cone toe touches using SPC and light HHA  PATIENT EDUCATION:  Education details: POC Person educated: Patient Education method: Explanation Education comprehension: verbalized understanding   HOME EXERCISE PROGRAM: TBD  ASSESSMENT:  CLINICAL IMPRESSION: Patient has had some issues with cooler damp weather, this seems to cause her to have knee pain.  He improved in the strength of the LE's, improved the TUG time and the 3 minute walk test distance.  She reports that she would like to get back out in her yard to pick up sticks but reports that she does not feel safe and is worried if she fell she could not get up, she does have a life alert now since her last fall.  I performed a Berg balance test and she scored a 35/56 putting her at a very high risk for falls.  We talked about her safety and the need for better balance, she lives alone and wants to stay independent, I think it is beneficial for PT to  really continue working with her on balance OBJECTIVE IMPAIRMENTS: Abnormal gait, cardiopulmonary status limiting activity, decreased activity tolerance, decreased balance, decreased endurance, decreased mobility, difficulty walking, decreased ROM, decreased strength, increased muscle spasms, impaired flexibility, postural dysfunction, and pain.    REHAB POTENTIAL: Good  CLINICAL DECISION MAKING: Stable/uncomplicated  EVALUATION COMPLEXITY: Low   GOALS: Goals reviewed with patient? Yes  SHORT TERM GOALS: Target date: 06/24/22  Independent with initial HEP Goal status: met  LONG TERM GOALS: Target date: 09/02/22  Decrease pain 50% Goal status: progressing 12/10/22  2.  Independent with an advanced HEP  Goal status:ongoing4/16/24  3.  Decrease TUG time to 13 seconds Goal status: progressing 12/10/22  4.  Increase LE strength to 4/5 Goal status: progressing 12/10/22  5.  Walk 10 minutes with SPC and without rest Goal status:  progressing 12/10/22  6:  increase berg balance test score to 45/56  PLAN: PT FREQUENCY: 1-2x/week  PT DURATION: 12 weeks  PLANNED INTERVENTIONS: Therapeutic exercises, Therapeutic activity, Neuromuscular re-education, Balance training, Gait training, Patient/Family education, Self Care, Joint mobilization, Stair training, Dry Needling, Electrical stimulation, Moist heat, and Manual therapy.  PLAN FOR NEXT SESSION:  will really start to work on [employer], PT 12/10/2022, 1:00 PM

## 2022-12-12 ENCOUNTER — Ambulatory Visit: Payer: Medicare Other | Admitting: Physical Therapy

## 2022-12-12 ENCOUNTER — Encounter: Payer: Self-pay | Admitting: Physical Therapy

## 2022-12-12 DIAGNOSIS — M5459 Other low back pain: Secondary | ICD-10-CM | POA: Diagnosis not present

## 2022-12-12 DIAGNOSIS — R262 Difficulty in walking, not elsewhere classified: Secondary | ICD-10-CM

## 2022-12-12 DIAGNOSIS — M62830 Muscle spasm of back: Secondary | ICD-10-CM | POA: Diagnosis not present

## 2022-12-12 DIAGNOSIS — M542 Cervicalgia: Secondary | ICD-10-CM

## 2022-12-12 NOTE — Therapy (Signed)
OUTPATIENT PHYSICAL THERAPY THORACOLUMBAR TREATMENT   Patient Name: Linda Mcgrath MRN: [id number] DOB:Dec 27, 1932, 89 y.o., female Today's Date: 12/12/2022   PT End of Session - 12/12/22 1446     Visit Number 34    Date for PT Re-Evaluation 01/09/23    Authorization Type [employer]    PT Start Time 1440    PT Stop Time 1525    PT Time Calculation (min) 45 min    Activity Tolerance Patient tolerated treatment well;Patient limited by pain    Behavior During Therapy [facility] for tasks assessed/performed             Past Medical History:  Diagnosis Date   Allergic rhinitis    Arthritis    Asthma    Cataract    Bil/lens implant   Colon polyp 1999   small polyp   Diverticulosis    Dysrhythmia    Fibromyalgia    Hyperlipidemia    Hypothyroidism    Internal hemorrhoids    Leg cramps    Lichen planus    Lung nodule    stable LUL 9 mm   Menopausal syndrome    Osteoarthritis    UTI (urinary tract infection)    Past Surgical History:  Procedure Laterality Date   Abd U/S  11/1998   negative   Abd U/S  01/2001   gallbladder polyps   ABDOMINAL HYSTERECTOMY  1975   total-fibroids   APPENDECTOMY  1975   BREAST BIOPSY     benign   BREAST EXCISIONAL BIOPSY Left 1957   CARDIOVERSION N/A 03/07/2016   Procedure: CARDIOVERSION;  Surgeon: Yates Decamp, MD;  Location: [facility] ENDOSCOPY;  Service: Cardiovascular;  Laterality: N/A;   CATARACT EXTRACTION     bilateral   CHOLECYSTECTOMY     COLONOSCOPY  1998   Diverticulosis; polyp\   COLONOSCOPY  09/2002   Diverticulosis, hem   COLONOSCOPY  12/2007   diverticulosis, polyp   DEXA  10/2001   osteopenia   ELECTROPHYSIOLOGIC STUDY N/A 04/03/2016   Procedure: A-Flutter Ablation;  Surgeon: Will [name], MD;  Location: [facility] INVASIVE CV LAB;  Service: Cardiovascular;  Laterality: N/A;   ESOPHAGOGASTRODUODENOSCOPY  2004   Hida scan  01/2001   Negative   KNEE SURGERY     right knee / 11/2004   LAMINECTOMY WITH POSTERIOR  LATERAL ARTHRODESIS LEVEL 2 N/A 07/31/2017   Procedure: DECOMPRESSIVE LAMINECTOMY LUMABR FOUR-FIVE, LUMBAR FIVE-SACRAL ONE;  Surgeon: Tia Alert, MD;  Location: [facility] OR;  Service: Neurosurgery;  Laterality: N/A;   laser surgery for glaucoma Left 09/21/2014   MULTIPLE TOOTH EXTRACTIONS     rentinal tear     ROTATOR CUFF REPAIR     x 2 /right shoulder   SKIN CANCER EXCISION     pre-melanoma / on face   TEE WITHOUT CARDIOVERSION N/A 03/07/2016   Procedure: TRANSESOPHAGEAL ECHOCARDIOGRAM (TEE);  Surgeon: Yates Decamp, MD;  [facility] ENDOSCOPY;  Service: Cardiovascular;  Laterality: N/A;   TOE SURGERY     rt foot    Patient Active Problem List   Diagnosis Date Noted   Right shoulder pain 10/23/2022   Hematoma 10/23/2022   Acute cystitis without hematuria 03/26/2022   Vaginal itching 03/26/2022   Urinary frequency 03/26/2022   Vaginal candida 03/26/2022   Chronic upper back pain 08/30/2020   Aortic atherosclerosis 05/31/2020   Abnormal CT scan, small bowel 05/18/2020   Insomnia 12/15/2019   Chronic low back pain 10/18/2019   Osteoarthritis of right knee 08/02/2019   S/P  lumbar spinal fusion 07/31/2017   H/O atrial flutter 03/06/2016   Obesity 11/06/2015   Urge incontinence 01/24/2015   Estrogen deficiency 10/25/2014   Lichen planus 12/20/2013   Prediabetes 06/03/2011   HYPERTENSION, BENIGN ESSENTIAL 10/23/2010   OSTEOARTHRITIS, HANDS, BILATERAL 01/24/2010   Hypothyroidism 08/24/2008   Vitamin D deficiency 06/06/2008   Hyperlipidemia 06/06/2008   MENOPAUSAL SYNDROME 03/29/2008   Osteoporosis 03/29/2008   Allergic rhinitis 01/25/2008   IBS 01/25/2008   OSTEOARTHRITIS 01/25/2008   FIBROMYALGIA 01/25/2008    PCP: [employer]  REFERRING PROVIDER: [name]  REFERRING DIAG: Back pain, difficulty walking, osteoporosis  Rationale for Evaluation and Treatment Rehabilitation  THERAPY DIAG:  Other low back pain  Muscle spasm of back  Difficulty in walking, not elsewhere  classified  Cervicalgia  ONSET DATE: September 2023  SUBJECTIVE:                                                                                                                                                                                           SUBJECTIVE STATEMENT: C/o right shoulder pain, unsure why, 6/10 trouble raising my arm PERTINENT HISTORY:  See above  PAIN:  Are you having pain? Yes: NPRS scale: 3/10 Pain location: knees Pain description: ache, sore Aggravating factors: yardwork, hosuework pain up to 8/10 Relieving factors: rest, pain can be 0/10 at times   PRECAUTIONS: None  WEIGHT BEARING RESTRICTIONS: No  FALLS:  Has patient fallen in last 6 months? No  LIVING ENVIRONMENT: Lives with: lives with their family and lives alone Lives in: House/apartment Stairs: No Has following equipment at home: Single point cane  OCCUPATION: retired  PLOF: Independent able to do yardwork and housework  PATIENT GOALS: walk better, less pain   OBJECTIVE:   DIAGNOSTIC FINDINGS:  osteoporosis  PATIENT SURVEYS:  FOTO 31  COGNITION:  Overall cognitive status: Within functional limits for tasks assessed     SENSATION: WFL  MUSCLE LENGTH: Tight calves, HS and piriformis  POSTURE: rounded shoulders, forward head, and decreased lumbar lordosis  PALPATION: She is tight and tender in the lumbar area and into the thoracic and upper trap area, mild tenderness in the buttocks  LUMBAR ROM:   AROM eval  Flexion Decreased 50%  Extension Decreased 100% with pain  Right lateral flexion   Left lateral flexion   Right rotation   Left rotation    (Blank rows = not tested)  LOWER EXTREMITY ROM:     Active  Right eval Left eval  Hip flexion    Hip extension    Hip abduction    Hip adduction    Hip internal rotation  Hip external rotation    Knee flexion    Knee extension    Ankle dorsiflexion    Ankle plantarflexion    Ankle inversion    Ankle  eversion     (Blank rows = not tested)  LOWER EXTREMITY MMT:    MMT Right eval Left eval Left /Right 12/10/22  Hip flexion 3+ 3+ 4-/4-  Hip extension     Hip abduction 3+ 3+ 4-/4-  Hip adduction     Hip internal rotation     Hip external rotation     Knee flexion 3+ with pain 3+ 4-/4-  Knee extension 3+ with pain 3+ 4+/4+  Ankle dorsiflexion 4- 4-   Ankle plantarflexion     Ankle inversion     Ankle eversion      (Blank rows = not tested)  FUNCTIONAL TESTS:  5 times sit to stand: 31 seconds: 10/01/22 30 seconds Timed up and go (TUG): 26 seconds with [facility]   07/25/22 TUG 22 seconds with SPC, 08/20/22 18 seconds with SPC, 10/01/22 23 seconds without device 11/05/22 with SPC 20 seconds 11/18/22 without device 16 seconds / 3 minute walk test = 350 feet 12/10/22 TUG no device 15 seconds / 3 minute walk test 375' Berg balance test:  35/56  GAIT: Distance walked: 60 feet Assistive device utilized: Single point cane Level of assistance: SBA Comments: slow, slightly stooped    TODAY'S TREATMENT:  [facility] Adult PT Treatment:                                                                                                                             12/12/22 Nustep level 5 x 6 mintues Walking outside negotiating curbs, some uneven terrain, CGA 2.5# LAQ 2.5# marches 2.5# hip abduction Direction changes Ball toss but this caused some shoulder pain Red tband clamshells Ball b/n knees squeeze  12/10/22 Nustep level 5 x 7 minutes On airex balance activities, reaching, head turns, weight shift, marches, cone toe touches with SPC and some HHA to at times CGA due to LOB Assessed Berg, TUG and 3 minute walk tests Side stepping, backward walking  12/05/22 Nustep level 5 x 7 minutes Red tband scapular stabilization 2 ways Red tband HS curls Standing 2# hip abduction Standing hip marches 2# Gait in clinic 240 feet without rest SBA with SPC, very slow 2# LAQ 3x10 Blue tband back  extension Side stepping  12/03/22 Red tband HS curl, ankle motions all directions Red tband clamshell Nustep level 5 x 7 minutes Discussed her goals and needs see clinical impression below 2# LAQ 2# marches Ball b/n knees squeeze Ball in lap isometrics Blue band trunk extension  11/20/22 Red tband HS curl Red tband hip abduction Nustep level 5 x 7 minutes Ball squeeze  2.5# marches 2.5# LAQ\ Blue tband trunk extension Ball in lap isometric abs Gait 200 feet with [facility]  11/18/22 Nustep level 5 x 6 minutes TUG and 3 minute walk test  as above Red tband HS curls Red tband hip abduction 2.5# hip extension and abduction 2.5# LAQ 2.5# marches Side stepping  PATIENT EDUCATION:  Education details: POC Person educated: Patient Education method: Explanation Education comprehension: verbalized understanding   HOME EXERCISE PROGRAM: TBD  ASSESSMENT:  CLINICAL IMPRESSION: Patient having some increase right shoulder pain, she had an issue about 5-6 weeks abo and had an injection, has been doing well until now, she is unsure of any thing she did.  We talked about her safety and the need for better balance, she lives alone and wants to stay independent, I think it is beneficial for PT to really continue working with her on balance OBJECTIVE IMPAIRMENTS: Abnormal gait, cardiopulmonary status limiting activity, decreased activity tolerance, decreased balance, decreased endurance, decreased mobility, difficulty walking, decreased ROM, decreased strength, increased muscle spasms, impaired flexibility, postural dysfunction, and pain.    REHAB POTENTIAL: Good  CLINICAL DECISION MAKING: Stable/uncomplicated  EVALUATION COMPLEXITY: Low   GOALS: Goals reviewed with patient? Yes  SHORT TERM GOALS: Target date: 06/24/22  Independent with initial HEP Goal status: met  LONG TERM GOALS: Target date: 09/02/22  Decrease pain 50% Goal status: progressing 12/10/22  2.  Independent with an  advanced HEP  Goal status:ongoing4/16/24  3.  Decrease TUG time to 13 seconds Goal status: progressing 12/10/22  4.  Increase LE strength to 4/5 Goal status: progressing 12/10/22  5.  Walk 10 minutes with SPC and without rest Goal status:  progressing 12/10/22  6:  increase berg balance test score to 45/56  PLAN: PT FREQUENCY: 1-2x/week  PT DURATION: 12 weeks  PLANNED INTERVENTIONS: Therapeutic exercises, Therapeutic activity, Neuromuscular re-education, Balance training, Gait training, Patient/Family education, Self Care, Joint mobilization, Stair training, Dry Needling, Electrical stimulation, Moist heat, and Manual therapy.  PLAN FOR NEXT SESSION:  will really start to work on [employer], PT 12/12/2022, 2:47 PM

## 2022-12-17 ENCOUNTER — Ambulatory Visit: Payer: Medicare Other | Admitting: Physical Therapy

## 2022-12-17 ENCOUNTER — Encounter: Payer: Self-pay | Admitting: Physical Therapy

## 2022-12-17 DIAGNOSIS — M62830 Muscle spasm of back: Secondary | ICD-10-CM

## 2022-12-17 DIAGNOSIS — R262 Difficulty in walking, not elsewhere classified: Secondary | ICD-10-CM | POA: Diagnosis not present

## 2022-12-17 DIAGNOSIS — M5459 Other low back pain: Secondary | ICD-10-CM | POA: Diagnosis not present

## 2022-12-17 DIAGNOSIS — M542 Cervicalgia: Secondary | ICD-10-CM | POA: Diagnosis not present

## 2022-12-17 NOTE — Therapy (Signed)
OUTPATIENT PHYSICAL THERAPY THORACOLUMBAR TREATMENT   Patient Name: Linda Mcgrath MRN: [id number] DOB:[birth year], 89 y.o., female Today's Date: 12/17/2022   PT End of Session - 12/17/22 1624     Visit Number 35    Date for PT Re-Evaluation 01/09/23    Authorization Type [employer]    PT Start Time 1610    PT Stop Time 1700    PT Time Calculation (min) 50 min    Activity Tolerance Patient tolerated treatment well;Patient limited by pain    Behavior During Therapy [facility] for tasks assessed/performed             Past Medical History:  Diagnosis Date   Allergic rhinitis    Arthritis    Asthma    Cataract    Bil/lens implant   Colon polyp 1999   small polyp   Diverticulosis    Dysrhythmia    Fibromyalgia    Hyperlipidemia    Hypothyroidism    Internal hemorrhoids    Leg cramps    Lichen planus    Lung nodule    stable LUL 9 mm   Menopausal syndrome    Osteoarthritis    UTI (urinary tract infection)    Past Surgical History:  Procedure Laterality Date   Abd U/S  11/1998   negative   Abd U/S  01/2001   gallbladder polyps   ABDOMINAL HYSTERECTOMY  1975   total-fibroids   APPENDECTOMY  1975   BREAST BIOPSY     benign   BREAST EXCISIONAL BIOPSY Left 1957   CARDIOVERSION N/A 03/07/2016   Procedure: CARDIOVERSION;  Surgeon: Yates Decamp, MD;  Location: [facility] ENDOSCOPY;  Service: Cardiovascular;  Laterality: N/A;   CATARACT EXTRACTION     bilateral   CHOLECYSTECTOMY     COLONOSCOPY  1998   Diverticulosis; polyp\   COLONOSCOPY  09/2002   Diverticulosis, hem   COLONOSCOPY  12/2007   diverticulosis, polyp   DEXA  10/2001   osteopenia   ELECTROPHYSIOLOGIC STUDY N/A 04/03/2016   Procedure: A-Flutter Ablation;  Surgeon: Will [name], MD;  Location: [facility] INVASIVE CV LAB;  Service: Cardiovascular;  Laterality: N/A;   ESOPHAGOGASTRODUODENOSCOPY  2004   Hida scan  01/2001   Negative   KNEE SURGERY     right knee / 11/2004   LAMINECTOMY WITH POSTERIOR  LATERAL ARTHRODESIS LEVEL 2 N/A 07/31/2017   Procedure: DECOMPRESSIVE LAMINECTOMY LUMABR FOUR-FIVE, LUMBAR FIVE-SACRAL ONE;  Surgeon: Tia Alert, MD;  Location: [facility] OR;  Service: Neurosurgery;  Laterality: N/A;   laser surgery for glaucoma Left 09/21/2014   MULTIPLE TOOTH EXTRACTIONS     rentinal tear     ROTATOR CUFF REPAIR     x 2 /right shoulder   SKIN CANCER EXCISION     pre-melanoma / on face   TEE WITHOUT CARDIOVERSION N/A 03/07/2016   Procedure: TRANSESOPHAGEAL ECHOCARDIOGRAM (TEE);  Surgeon: Yates Decamp, MD;  Location: [facility] ENDOSCOPY;  Service: Cardiovascular;  Laterality: N/A;   TOE SURGERY     rt foot    Patient Active Problem List   Diagnosis Date Noted   Right shoulder pain 10/23/2022   Hematoma 10/23/2022   Acute cystitis without hematuria 03/26/2022   Vaginal itching 03/26/2022   Urinary frequency 03/26/2022   Vaginal candida 03/26/2022   Chronic upper back pain 08/30/2020   Aortic atherosclerosis 05/31/2020   Abnormal CT scan, small bowel 05/18/2020   Insomnia 12/15/2019   Chronic low back pain 10/18/2019   Osteoarthritis of right knee 08/02/2019   S/P  lumbar spinal fusion 07/31/2017   H/O atrial flutter 03/06/2016   Obesity 11/06/2015   Urge incontinence 01/24/2015   Estrogen deficiency 10/25/2014   Lichen planus 12/20/2013   Prediabetes 06/03/2011   HYPERTENSION, BENIGN ESSENTIAL 10/23/2010   OSTEOARTHRITIS, HANDS, BILATERAL 01/24/2010   Hypothyroidism 08/24/2008   Vitamin D deficiency 06/06/2008   Hyperlipidemia 06/06/2008   MENOPAUSAL SYNDROME 03/29/2008   Osteoporosis 03/29/2008   Allergic rhinitis 01/25/2008   IBS 01/25/2008   OSTEOARTHRITIS 01/25/2008   FIBROMYALGIA 01/25/2008    PCP: [employer]  REFERRING PROVIDER: [name]  REFERRING DIAG: Back pain, difficulty walking, osteoporosis  Rationale for Evaluation and Treatment Rehabilitation  THERAPY DIAG:  Other low back pain  Muscle spasm of back  Difficulty in walking, not elsewhere  classified  ONSET DATE: September 2023  SUBJECTIVE:                                                                                                                                                                                           SUBJECTIVE STATEMENT: Still with right shoulder pain and has a higher rating and c/o right knee pain, she has put a neoprene brace on and reports that this helps some PERTINENT HISTORY:  See above  PAIN:  Are you having pain? Yes: NPRS scale: 3/10 Pain location: knees Pain description: ache, sore Aggravating factors: yardwork, hosuework pain up to 8/10 Relieving factors: rest, pain can be 0/10 at times   PRECAUTIONS: None  WEIGHT BEARING RESTRICTIONS: No  FALLS:  Has patient fallen in last 6 months? No  LIVING ENVIRONMENT: Lives with: lives with their family and lives alone Lives in: House/apartment Stairs: No Has following equipment at home: Single point cane  OCCUPATION: retired  PLOF: Independent able to do yardwork and housework  PATIENT GOALS: walk better, less pain   OBJECTIVE:   DIAGNOSTIC FINDINGS:  osteoporosis  PATIENT SURVEYS:  FOTO 31  COGNITION:  Overall cognitive status: Within functional limits for tasks assessed     SENSATION: WFL  MUSCLE LENGTH: Tight calves, HS and piriformis  POSTURE: rounded shoulders, forward head, and decreased lumbar lordosis  PALPATION: She is tight and tender in the lumbar area and into the thoracic and upper trap area, mild tenderness in the buttocks  LUMBAR ROM:   AROM eval  Flexion Decreased 50%  Extension Decreased 100% with pain  Right lateral flexion   Left lateral flexion   Right rotation   Left rotation    (Blank rows = not tested)  LOWER EXTREMITY ROM:     Active  Right eval Left eval  Hip flexion    Hip extension    Hip  abduction    Hip adduction    Hip internal rotation    Hip external rotation    Knee flexion    Knee extension    Ankle  dorsiflexion    Ankle plantarflexion    Ankle inversion    Ankle eversion     (Blank rows = not tested)  LOWER EXTREMITY MMT:    MMT Right eval Left eval Left /Right 12/10/22  Hip flexion 3+ 3+ 4-/4-  Hip extension     Hip abduction 3+ 3+ 4-/4-  Hip adduction     Hip internal rotation     Hip external rotation     Knee flexion 3+ with pain 3+ 4-/4-  Knee extension 3+ with pain 3+ 4+/4+  Ankle dorsiflexion 4- 4-   Ankle plantarflexion     Ankle inversion     Ankle eversion      (Blank rows = not tested)  FUNCTIONAL TESTS:  5 times sit to stand: 31 seconds: 10/01/22 30 seconds Timed up and go (TUG): 26 seconds with [facility]   07/25/22 TUG 22 seconds with SPC, 08/20/22 18 seconds with SPC, 10/01/22 23 seconds without device 11/05/22 with SPC 20 seconds 11/18/22 without device 16 seconds / 3 minute walk test = 350 feet 12/10/22 TUG no device 15 seconds / 3 minute walk test 375' Berg balance test:  35/56  GAIT: Distance walked: 60 feet Assistive device utilized: Single point cane Level of assistance: SBA Comments: slow, slightly stooped    TODAY'S TREATMENT:  [facility] Adult PT Treatment:                                                                                                                             12/17/22 Walk outside 1/2 parking [place] Still limited with the knee pain On airex weight shifts and marches Standing volleyball Red tband HS curls 2.5# LAQ 2.5# marches Red tband clamshells  12/12/22 Nustep level 5 x 6 mintues Walking outside negotiating curbs, some uneven terrain, CGA 2.5# LAQ 2.5# marches 2.5# hip abduction Direction changes Ball toss but this caused some shoulder pain Red tband clamshells Ball b/n knees squeeze  12/10/22 Nustep level 5 x 7 minutes On airex balance activities, reaching, head turns, weight shift, marches, cone toe touches with SPC and some HHA to at times CGA due to LOB Assessed Berg, TUG and 3 minute walk tests Side stepping,  backward walking  12/05/22 Nustep level 5 x 7 minutes Red tband scapular stabilization 2 ways Red tband HS curls Standing 2# hip abduction Standing hip marches 2# Gait in clinic 240 feet without rest SBA with SPC, very slow 2# LAQ 3x10 Blue tband back extension Side stepping  12/03/22 Red tband HS curl, ankle motions all directions Red tband clamshell Nustep level 5 x 7 minutes Discussed her goals and needs see clinical impression below 2# LAQ 2# marches Ball b/n knees squeeze Ball in lap isometrics Blue band trunk extension  11/20/22 Red tband  HS curl Red tband hip abduction Nustep level 5 x 7 minutes Ball squeeze  2.5# marches 2.5# LAQ\ Blue tband trunk extension Ball in lap isometric abs Gait 200 feet with SPC  11/18/22 Nustep level 5 x 6 minutes TUG and 3 minute walk test as above Red tband HS curls Red tband hip abduction 2.5# hip extension and abduction 2.5# LAQ 2.5# marches Side stepping  PATIENT EDUCATION:  Education details: POC Person educated: Patient Education method: Explanation Education comprehension: verbalized understanding   HOME EXERCISE PROGRAM: TBD  ASSESSMENT:  CLINICAL IMPRESSION: Patient with right shoulder and knee pain increased today, the knee and the shoulder seem to bother her enough to limit some of the exercises.  She is using a brace on the knee today and she does report it is helping, I encouraged her to call the MD if she is really hurting and if it is limiting her ability to do ADL's, she feels that it is improving and that she just has some bad days of pain OBJECTIVE IMPAIRMENTS: Abnormal gait, cardiopulmonary status limiting activity, decreased activity tolerance, decreased balance, decreased endurance, decreased mobility, difficulty walking, decreased ROM, decreased strength, increased muscle spasms, impaired flexibility, postural dysfunction, and pain.    REHAB POTENTIAL: Good  CLINICAL DECISION MAKING:  Stable/uncomplicated  EVALUATION COMPLEXITY: Low   GOALS: Goals reviewed with patient? Yes  SHORT TERM GOALS: Target date: 06/24/22  Independent with initial HEP Goal status: met  LONG TERM GOALS: Target date: 09/02/22  Decrease pain 50% Goal status: progressing 12/10/22  2.  Independent with an advanced HEP  Goal status:ongoing4/23/24  3.  Decrease TUG time to 13 seconds Goal status: progressing 12/10/22  4.  Increase LE strength to 4/5 Goal status: progressing 12/17/22  5.  Walk 10 minutes with SPC and without rest Goal status:  progressing 12/10/22  6:  increase berg balance test score to 45/56  PLAN: PT FREQUENCY: 1-2x/week  PT DURATION: 12 weeks  PLANNED INTERVENTIONS: Therapeutic exercises, Therapeutic activity, Neuromuscular re-education, Balance training, Gait training, Patient/Family education, Self Care, Joint mobilization, Stair training, Dry Needling, Electrical stimulation, Moist heat, and Manual therapy.  PLAN FOR NEXT SESSION:  will really start to work on [employer], PT 12/17/2022, 4:28 PM

## 2022-12-19 ENCOUNTER — Encounter: Payer: Self-pay | Admitting: Physical Therapy

## 2022-12-19 ENCOUNTER — Ambulatory Visit: Payer: Medicare Other | Admitting: Physical Therapy

## 2022-12-19 DIAGNOSIS — M62830 Muscle spasm of back: Secondary | ICD-10-CM | POA: Diagnosis not present

## 2022-12-19 DIAGNOSIS — M5459 Other low back pain: Secondary | ICD-10-CM

## 2022-12-19 DIAGNOSIS — R262 Difficulty in walking, not elsewhere classified: Secondary | ICD-10-CM | POA: Diagnosis not present

## 2022-12-19 DIAGNOSIS — M542 Cervicalgia: Secondary | ICD-10-CM

## 2022-12-19 NOTE — Therapy (Signed)
OUTPATIENT PHYSICAL THERAPY THORACOLUMBAR TREATMENT   Patient Name: Linda Mcgrath MRN: [id number] DOB:02-May-1933, 89 y.o., female Today's Date: 12/19/2022   PT End of Session - 12/19/22 1446     Visit Number 36    Date for PT Re-Evaluation 01/09/23    Authorization Type [employer]    PT Start Time 1440    PT Stop Time 1525    PT Time Calculation (min) 45 min    Activity Tolerance Patient tolerated treatment well;Patient limited by pain    Behavior During Therapy [facility] for tasks assessed/performed             Past Medical History:  Diagnosis Date   Allergic rhinitis    Arthritis    Asthma    Cataract    Bil/lens implant   Colon polyp 1999   small polyp   Diverticulosis    Dysrhythmia    Fibromyalgia    Hyperlipidemia    Hypothyroidism    Internal hemorrhoids    Leg cramps    Lichen planus    Lung nodule    stable LUL 9 mm   Menopausal syndrome    Osteoarthritis    UTI (urinary tract infection)    Past Surgical History:  Procedure Laterality Date   Abd U/S  11/1998   negative   Abd U/S  01/2001   gallbladder polyps   ABDOMINAL HYSTERECTOMY  1975   total-fibroids   APPENDECTOMY  1975   BREAST BIOPSY     benign   BREAST EXCISIONAL BIOPSY Left 1957   CARDIOVERSION N/A 03/07/2016   Procedure: CARDIOVERSION;  Surgeon: Yates Decamp, MD;  Location: [facility] ENDOSCOPY;  Service: Cardiovascular;  Laterality: N/A;   CATARACT EXTRACTION     bilateral   CHOLECYSTECTOMY     COLONOSCOPY  1998   Diverticulosis; polyp\   COLONOSCOPY  09/2002   Diverticulosis, hem   COLONOSCOPY  12/2007   diverticulosis, polyp   DEXA  10/2001   osteopenia   ELECTROPHYSIOLOGIC STUDY N/A 04/03/2016   Procedure: A-Flutter Ablation;  Surgeon: Will [name], MD;  Location: [facility] INVASIVE CV LAB;  Service: Cardiovascular;  Laterality: N/A;   ESOPHAGOGASTRODUODENOSCOPY  2004   Hida scan  01/2001   Negative   KNEE SURGERY     right knee / 11/2004   LAMINECTOMY WITH POSTERIOR  LATERAL ARTHRODESIS LEVEL 2 N/A 07/31/2017   Procedure: DECOMPRESSIVE LAMINECTOMY LUMABR FOUR-FIVE, LUMBAR FIVE-SACRAL ONE;  Surgeon: Tia Alert, MD;  Location: [facility] OR;  Service: Neurosurgery;  Laterality: N/A;   laser surgery for glaucoma Left 09/21/2014   MULTIPLE TOOTH EXTRACTIONS     rentinal tear     ROTATOR CUFF REPAIR     x 2 /right shoulder   SKIN CANCER EXCISION     pre-melanoma / on face   TEE WITHOUT CARDIOVERSION N/A 03/07/2016   Procedure: TRANSESOPHAGEAL ECHOCARDIOGRAM (TEE);  Surgeon: Yates Decamp, MD;  Location: [facility] ENDOSCOPY;  Service: Cardiovascular;  Laterality: N/A;   TOE SURGERY     rt foot    Patient Active Problem List   Diagnosis Date Noted   Right shoulder pain 10/23/2022   Hematoma 10/23/2022   Acute cystitis without hematuria 03/26/2022   Vaginal itching 03/26/2022   Urinary frequency 03/26/2022   Vaginal candida 03/26/2022   Chronic upper back pain 08/30/2020   Aortic atherosclerosis 05/31/2020   Abnormal CT scan, small bowel 05/18/2020   Insomnia 12/15/2019   Chronic low back pain 10/18/2019   Osteoarthritis of right knee 08/02/2019   S/P  lumbar spinal fusion 07/31/2017   H/O atrial flutter 03/06/2016   Obesity 11/06/2015   Urge incontinence 01/24/2015   Estrogen deficiency 10/25/2014   Lichen planus 12/20/2013   Prediabetes 06/03/2011   HYPERTENSION, BENIGN ESSENTIAL 10/23/2010   OSTEOARTHRITIS, HANDS, BILATERAL 01/24/2010   Hypothyroidism 08/24/2008   Vitamin D deficiency 06/06/2008   Hyperlipidemia 06/06/2008   MENOPAUSAL SYNDROME 03/29/2008   Osteoporosis 03/29/2008   Allergic rhinitis 01/25/2008   IBS 01/25/2008   OSTEOARTHRITIS 01/25/2008   FIBROMYALGIA 01/25/2008    PCP: [employer]  REFERRING PROVIDER: [name]  REFERRING DIAG: Back pain, difficulty walking, osteoporosis  Rationale for Evaluation and Treatment Rehabilitation  THERAPY DIAG:  Other low back pain  Muscle spasm of back  Difficulty in walking, not elsewhere  classified  Cervicalgia  ONSET DATE: September 2023  SUBJECTIVE:                                                                                                                                                                                           SUBJECTIVE STATEMENT: Maybe a little less pain, still having difficulty with the right shoulder, moving slow PERTINENT HISTORY:  See above  PAIN:  Are you having pain? Yes: NPRS scale: 3/10 Pain location: knees Pain description: ache, sore Aggravating factors: yardwork, hosuework pain up to 8/10 Relieving factors: rest, pain can be 0/10 at times   PRECAUTIONS: None  WEIGHT BEARING RESTRICTIONS: No  FALLS:  Has patient fallen in last 6 months? No  LIVING ENVIRONMENT: Lives with: lives with their family and lives alone Lives in: House/apartment Stairs: No Has following equipment at home: Single point cane  OCCUPATION: retired  PLOF: Independent able to do yardwork and housework  PATIENT GOALS: walk better, less pain   OBJECTIVE:   DIAGNOSTIC FINDINGS:  osteoporosis  PATIENT SURVEYS:  FOTO 31  COGNITION:  Overall cognitive status: Within functional limits for tasks assessed     SENSATION: WFL  MUSCLE LENGTH: Tight calves, HS and piriformis  POSTURE: rounded shoulders, forward head, and decreased lumbar lordosis  PALPATION: She is tight and tender in the lumbar area and into the thoracic and upper trap area, mild tenderness in the buttocks  LUMBAR ROM:   AROM eval  Flexion Decreased 50%  Extension Decreased 100% with pain  Right lateral flexion   Left lateral flexion   Right rotation   Left rotation    (Blank rows = not tested)  LOWER EXTREMITY ROM:     Active  Right eval Left eval  Hip flexion    Hip extension    Hip abduction    Hip adduction    Hip internal rotation  Hip external rotation    Knee flexion    Knee extension    Ankle dorsiflexion    Ankle plantarflexion    Ankle  inversion    Ankle eversion     (Blank rows = not tested)  LOWER EXTREMITY MMT:    MMT Right eval Left eval Left /Right 12/10/22  Hip flexion 3+ 3+ 4-/4-  Hip extension     Hip abduction 3+ 3+ 4-/4-  Hip adduction     Hip internal rotation     Hip external rotation     Knee flexion 3+ with pain 3+ 4-/4-  Knee extension 3+ with pain 3+ 4+/4+  Ankle dorsiflexion 4- 4-   Ankle plantarflexion     Ankle inversion     Ankle eversion      (Blank rows = not tested)  FUNCTIONAL TESTS:  5 times sit to stand: 31 seconds: 10/01/22 30 seconds Timed up and go (TUG): 26 seconds with [facility]   07/25/22 TUG 22 seconds with SPC, 08/20/22 18 seconds with SPC, 10/01/22 23 seconds without device 11/05/22 with SPC 20 seconds 11/18/22 without device 16 seconds / 3 minute walk test = 350 feet 12/10/22 TUG no device 15 seconds / 3 minute walk test 375' Berg balance test:  35/56  GAIT: Distance walked: 60 feet Assistive device utilized: Single point cane Level of assistance: SBA Comments: slow, slightly stooped    TODAY'S TREATMENT:  [facility] Adult PT Treatment:                                                                                                                             12/19/22 Nustep level 5 x 6 minutes Walk outside full parking island 3# LAQ 3# marches 3# hip abduction Red tband ankle exercises Side stepping over objects Marching on airex  12/17/22 Walk outside 1/2 parking [place] Still limited with the knee pain On airex weight shifts and marches Standing volleyball Red tband HS curls 2.5# LAQ 2.5# marches Red tband clamshells  12/12/22 Nustep level 5 x 6 mintues Walking outside negotiating curbs, some uneven terrain, CGA 2.5# LAQ 2.5# marches 2.5# hip abduction Direction changes Ball toss but this caused some shoulder pain Red tband clamshells Ball b/n knees squeeze  12/10/22 Nustep level 5 x 7 minutes On airex balance activities, reaching, head turns, weight shift,  marches, cone toe touches with SPC and some HHA to at times CGA due to LOB Assessed Berg, TUG and 3 minute walk tests Side stepping, backward walking  12/05/22 Nustep level 5 x 7 minutes Red tband scapular stabilization 2 ways Red tband HS curls Standing 2# hip abduction Standing hip marches 2# Gait in clinic 240 feet without rest SBA with SPC, very slow 2# LAQ 3x10 Blue tband back extension Side stepping  12/03/22 Red tband HS curl, ankle motions all directions Red tband clamshell Nustep level 5 x 7 minutes Discussed her goals and needs see clinical impression below 2# LAQ 2# marches  Ball b/n knees squeeze Ball in lap isometrics Blue band trunk extension  11/20/22 Red tband HS curl Red tband hip abduction Nustep level 5 x 7 minutes Ball squeeze  2.5# marches 2.5# LAQ\ Blue tband trunk extension Ball in lap isometric abs Gait 200 feet with SPC  11/18/22 Nustep level 5 x 6 minutes TUG and 3 minute walk test as above Red tband HS curls Red tband hip abduction 2.5# hip extension and abduction 2.5# LAQ 2.5# marches Side stepping  PATIENT EDUCATION:  Education details: POC Person educated: Patient Education method: Explanation Education comprehension: verbalized understanding   HOME EXERCISE PROGRAM: TBD  ASSESSMENT:  CLINICAL IMPRESSION: Patient continues to have the shoulder and knee pain.  We talked today about her safety as she is going to have help with cleaning her front porch, we talked about clutter, assuring clear paths and if she has wet porch to assure footing or let the other person do most of the work.  She seemed to tolerate an increased walk today without much increased knee pain OBJECTIVE IMPAIRMENTS: Abnormal gait, cardiopulmonary status limiting activity, decreased activity tolerance, decreased balance, decreased endurance, decreased mobility, difficulty walking, decreased ROM, decreased strength, increased muscle spasms, impaired flexibility,  postural dysfunction, and pain.    REHAB POTENTIAL: Good  CLINICAL DECISION MAKING: Stable/uncomplicated  EVALUATION COMPLEXITY: Low   GOALS: Goals reviewed with patient? Yes  SHORT TERM GOALS: Target date: 06/24/22  Independent with initial HEP Goal status: met  LONG TERM GOALS: Target date: 09/02/22  Decrease pain 50% Goal status: progressing 12/10/22  2.  Independent with an advanced HEP  Goal status:ongoing4/23/24  3.  Decrease TUG time to 13 seconds Goal status: progressing 12/10/22  4.  Increase LE strength to 4/5 Goal status: progressing 12/17/22  5.  Walk 10 minutes with SPC and without rest Goal status:  progressing 12/19/22  6:  increase berg balance test score to 45/56  PLAN: PT FREQUENCY: 1-2x/week  PT DURATION: 12 weeks  PLANNED INTERVENTIONS: Therapeutic exercises, Therapeutic activity, Neuromuscular re-education, Balance training, Gait training, Patient/Family education, Self Care, Joint mobilization, Stair training, Dry Needling, Electrical stimulation, Moist heat, and Manual therapy.  PLAN FOR NEXT SESSION:  will really start to work on [employer], PT 12/19/2022, 2:47 PM

## 2022-12-24 ENCOUNTER — Ambulatory Visit: Payer: Medicare Other | Admitting: Physical Therapy

## 2022-12-26 ENCOUNTER — Ambulatory Visit: Payer: Medicare Other | Attending: Family Medicine | Admitting: Physical Therapy

## 2022-12-26 ENCOUNTER — Encounter: Payer: Self-pay | Admitting: Physical Therapy

## 2022-12-26 DIAGNOSIS — M5459 Other low back pain: Secondary | ICD-10-CM | POA: Insufficient documentation

## 2022-12-26 DIAGNOSIS — R262 Difficulty in walking, not elsewhere classified: Secondary | ICD-10-CM | POA: Diagnosis not present

## 2022-12-26 DIAGNOSIS — M542 Cervicalgia: Secondary | ICD-10-CM

## 2022-12-26 DIAGNOSIS — M62830 Muscle spasm of back: Secondary | ICD-10-CM

## 2022-12-26 NOTE — Therapy (Signed)
OUTPATIENT PHYSICAL THERAPY THORACOLUMBAR TREATMENT   Patient Name: VEANNA PETTWAY MRN: [id number] DOB:16-Nov-1932, 89 y.o., female Today's Date: 12/26/2022   PT End of Session - 12/26/22 1430     Visit Number 37    Date for PT Re-Evaluation 01/09/23    Authorization Type [employer]    PT Start Time 1355    PT Stop Time 1442    PT Time Calculation (min) 47 min    Activity Tolerance Patient tolerated treatment well    Behavior During Therapy [facility] for tasks assessed/performed             Past Medical History:  Diagnosis Date   Allergic rhinitis    Arthritis    Asthma    Cataract    Bil/lens implant   Colon polyp 1999   small polyp   Diverticulosis    Dysrhythmia    Fibromyalgia    Hyperlipidemia    Hypothyroidism    Internal hemorrhoids    Leg cramps    Lichen planus    Lung nodule    stable LUL 9 mm   Menopausal syndrome    Osteoarthritis    UTI (urinary tract infection)    Past Surgical History:  Procedure Laterality Date   Abd U/S  11/1998   negative   Abd U/S  01/2001   gallbladder polyps   ABDOMINAL HYSTERECTOMY  1975   total-fibroids   APPENDECTOMY  1975   BREAST BIOPSY     benign   BREAST EXCISIONAL BIOPSY Left 1957   CARDIOVERSION N/A 03/07/2016   Procedure: CARDIOVERSION;  Surgeon: Yates Decamp, MD;  Location: [facility] ENDOSCOPY;  Service: Cardiovascular;  Laterality: N/A;   CATARACT EXTRACTION     bilateral   CHOLECYSTECTOMY     COLONOSCOPY  1998   Diverticulosis; polyp\   COLONOSCOPY  09/2002   Diverticulosis, hem   COLONOSCOPY  12/2007   diverticulosis, polyp   DEXA  10/2001   osteopenia   ELECTROPHYSIOLOGIC STUDY N/A 04/03/2016   Procedure: A-Flutter Ablation;  Surgeon: Will [name], MD;  Location: [facility] INVASIVE CV LAB;  Service: Cardiovascular;  Laterality: N/A;   ESOPHAGOGASTRODUODENOSCOPY  2004   Hida scan  01/2001   Negative   KNEE SURGERY     right knee / 11/2004   LAMINECTOMY WITH POSTERIOR LATERAL ARTHRODESIS LEVEL 2 N/A  07/31/2017   Procedure: DECOMPRESSIVE LAMINECTOMY LUMABR FOUR-FIVE, LUMBAR FIVE-SACRAL ONE;  Surgeon: Tia Alert, MD;  Location: [facility] OR;  Service: Neurosurgery;  Laterality: N/A;   laser surgery for glaucoma Left 09/21/2014   MULTIPLE TOOTH EXTRACTIONS     rentinal tear     ROTATOR CUFF REPAIR     x 2 /right shoulder   SKIN CANCER EXCISION     pre-melanoma / on face   TEE WITHOUT CARDIOVERSION N/A 03/07/2016   Procedure: TRANSESOPHAGEAL ECHOCARDIOGRAM (TEE);  Surgeon: Yates Decamp, MD;  Location: [facility] ENDOSCOPY;  Service: Cardiovascular;  Laterality: N/A;   TOE SURGERY     rt foot    Patient Active Problem List   Diagnosis Date Noted   Right shoulder pain 10/23/2022   Hematoma 10/23/2022   Acute cystitis without hematuria 03/26/2022   Vaginal itching 03/26/2022   Urinary frequency 03/26/2022   Vaginal candida 03/26/2022   Chronic upper back pain 08/30/2020   Aortic atherosclerosis (HCC) 05/31/2020   Abnormal CT scan, small bowel 05/18/2020   Insomnia 12/15/2019   Chronic low back pain 10/18/2019   Osteoarthritis of right knee 08/02/2019   S/P lumbar spinal  fusion 07/31/2017   H/O atrial flutter 03/06/2016   Obesity 11/06/2015   Urge incontinence 01/24/2015   Estrogen deficiency 10/25/2014   Lichen planus 12/20/2013   Prediabetes 06/03/2011   HYPERTENSION, BENIGN ESSENTIAL 10/23/2010   OSTEOARTHRITIS, HANDS, BILATERAL 01/24/2010   Hypothyroidism 08/24/2008   Vitamin D deficiency 06/06/2008   Hyperlipidemia 06/06/2008   MENOPAUSAL SYNDROME 03/29/2008   Osteoporosis 03/29/2008   Allergic rhinitis 01/25/2008   IBS 01/25/2008   OSTEOARTHRITIS 01/25/2008   FIBROMYALGIA 01/25/2008    PCP: [employer]  REFERRING PROVIDER: [name]  REFERRING DIAG: Back pain, difficulty walking, osteoporosis  Rationale for Evaluation and Treatment Rehabilitation  THERAPY DIAG:  Other low back pain  Muscle spasm of back  Difficulty in walking, not elsewhere classified  Cervicalgia  ONSET  DATE: September 2023  SUBJECTIVE:                                                                                                                                                                                           SUBJECTIVE STATEMENT: Shoulder doing a little better, reports difficulty picking things up from the floor at home PERTINENT HISTORY:  See above  PAIN:  Are you having pain? Yes: NPRS scale: 2/10 Pain location: knees Pain description: ache, sore Aggravating factors: yardwork, hosuework pain up to 8/10 Relieving factors: rest, pain can be 0/10 at times   PRECAUTIONS: None  WEIGHT BEARING RESTRICTIONS: No  FALLS:  Has patient fallen in last 6 months? No  LIVING ENVIRONMENT: Lives with: lives with their family and lives alone Lives in: House/apartment Stairs: No Has following equipment at home: Single point cane  OCCUPATION: retired  PLOF: Independent able to do yardwork and housework  PATIENT GOALS: walk better, less pain   OBJECTIVE:   DIAGNOSTIC FINDINGS:  osteoporosis  PATIENT SURVEYS:  FOTO 31  COGNITION:  Overall cognitive status: Within functional limits for tasks assessed     SENSATION: WFL  MUSCLE LENGTH: Tight calves, HS and piriformis  POSTURE: rounded shoulders, forward head, and decreased lumbar lordosis  PALPATION: She is tight and tender in the lumbar area and into the thoracic and upper trap area, mild tenderness in the buttocks  LUMBAR ROM:   AROM eval  Flexion Decreased 50%  Extension Decreased 100% with pain  Right lateral flexion   Left lateral flexion   Right rotation   Left rotation    (Blank rows = not tested)  LOWER EXTREMITY ROM:     Active  Right eval Left eval  Hip flexion    Hip extension    Hip abduction    Hip adduction    Hip internal rotation  Hip external rotation    Knee flexion    Knee extension    Ankle dorsiflexion    Ankle plantarflexion    Ankle inversion    Ankle eversion      (Blank rows = not tested)  LOWER EXTREMITY MMT:    MMT Right eval Left eval Left /Right 12/10/22  Hip flexion 3+ 3+ 4-/4-  Hip extension     Hip abduction 3+ 3+ 4-/4-  Hip adduction     Hip internal rotation     Hip external rotation     Knee flexion 3+ with pain 3+ 4-/4-  Knee extension 3+ with pain 3+ 4+/4+  Ankle dorsiflexion 4- 4-   Ankle plantarflexion     Ankle inversion     Ankle eversion      (Blank rows = not tested)  FUNCTIONAL TESTS:  5 times sit to stand: 31 seconds: 10/01/22 30 seconds Timed up and go (TUG): 26 seconds with [facility]   07/25/22 TUG 22 seconds with SPC, 08/20/22 18 seconds with SPC, 10/01/22 23 seconds without device 11/05/22 with SPC 20 seconds 11/18/22 without device 16 seconds / 3 minute walk test = 350 feet 12/10/22 TUG no device 15 seconds / 3 minute walk test 375' Berg balance test:  35/56  GAIT: Distance walked: 60 feet Assistive device utilized: Single point cane Level of assistance: SBA Comments: slow, slightly stooped    TODAY'S TREATMENT:  [facility] Adult PT Treatment:                                                                                                                             12/26/22 2.5# marches 2.5# hip abduction 2.5# LAQ 2.5# hip extension On airex standing, reaching, head turns with CGA Side stepping over stick Picking up cones, able to do if they are standing up, but really struggles if they are lying down, can't bend knees and then arthritic hands limit getting that low Negotiating around obstacles Standing ball toss  12/19/22 Nustep level 5 x 6 minutes Walk outside full parking island 3# LAQ 3# marches 3# hip abduction Red tband ankle exercises Side stepping over objects Marching on airex  12/17/22 Walk outside 1/2 parking [place] Still limited with the knee pain On airex weight shifts and marches Standing volleyball Red tband HS curls 2.5# LAQ 2.5# marches Red tband clamshells  12/12/22 Nustep level 5 x 6  mintues Walking outside negotiating curbs, some uneven terrain, CGA 2.5# LAQ 2.5# marches 2.5# hip abduction Direction changes Ball toss but this caused some shoulder pain Red tband clamshells Ball b/n knees squeeze  12/10/22 Nustep level 5 x 7 minutes On airex balance activities, reaching, head turns, weight shift, marches, cone toe touches with SPC and some HHA to at times CGA due to LOB Assessed Berg, TUG and 3 minute walk tests Side stepping, backward walking  12/05/22 Nustep level 5 x 7 minutes Red tband scapular stabilization 2 ways Red tband HS curls Standing  2# hip abduction Standing hip marches 2# Gait in clinic 240 feet without rest SBA with SPC, very slow 2# LAQ 3x10 Blue tband back extension Side stepping  12/03/22 Red tband HS curl, ankle motions all directions Red tband clamshell Nustep level 5 x 7 minutes Discussed her goals and needs see clinical impression below 2# LAQ 2# marches Ball b/n knees squeeze Ball in lap isometrics Blue band trunk extension  11/20/22 Red tband HS curl Red tband hip abduction Nustep level 5 x 7 minutes Ball squeeze  2.5# marches 2.5# LAQ\ Blue tband trunk extension Ball in lap isometric abs Gait 200 feet with SPC  11/18/22 Nustep level 5 x 6 minutes TUG and 3 minute walk test as above Red tband HS curls Red tband hip abduction 2.5# hip extension and abduction 2.5# LAQ 2.5# marches Side stepping  PATIENT EDUCATION:  Education details: POC Person educated: Patient Education method: Explanation Education comprehension: verbalized understanding   HOME EXERCISE PROGRAM: TBD  ASSESSMENT:  CLINICAL IMPRESSION: Patient has problems picking things up from the floor, she did well with minimal problems when she only had to reach to 8" off the floor, with the cone on its side she really struggled with this due to knees and hands, she also struggles with the dynamic surfaces OBJECTIVE IMPAIRMENTS: Abnormal gait,  cardiopulmonary status limiting activity, decreased activity tolerance, decreased balance, decreased endurance, decreased mobility, difficulty walking, decreased ROM, decreased strength, increased muscle spasms, impaired flexibility, postural dysfunction, and pain.    REHAB POTENTIAL: Good  CLINICAL DECISION MAKING: Stable/uncomplicated  EVALUATION COMPLEXITY: Low   GOALS: Goals reviewed with patient? Yes  SHORT TERM GOALS: Target date: 06/24/22  Independent with initial HEP Goal status: met  LONG TERM GOALS: Target date: 09/02/22  Decrease pain 50% Goal status: progressing 12/10/22  2.  Independent with an advanced HEP  Goal status:ongoing4/23/24  3.  Decrease TUG time to 13 seconds Goal status: progressing 12/10/22  4.  Increase LE strength to 4/5 Goal status: progressing 12/17/22  5.  Walk 10 minutes with SPC and without rest Goal status:  progressing 12/19/22  6:  increase berg balance test score to 45/56  PLAN: PT FREQUENCY: 1-2x/week  PT DURATION: 12 weeks  PLANNED INTERVENTIONS: Therapeutic exercises, Therapeutic activity, Neuromuscular re-education, Balance training, Gait training, Patient/Family education, Self Care, Joint mobilization, Stair training, Dry Needling, Electrical stimulation, Moist heat, and Manual therapy.  PLAN FOR NEXT SESSION:  continue balance and work on her safety at home Jearld Lesch, PT 12/26/2022, 2:32 PM

## 2022-12-31 ENCOUNTER — Ambulatory Visit: Payer: Medicare Other | Admitting: Physical Therapy

## 2022-12-31 ENCOUNTER — Encounter: Payer: Self-pay | Admitting: Physical Therapy

## 2022-12-31 DIAGNOSIS — R262 Difficulty in walking, not elsewhere classified: Secondary | ICD-10-CM

## 2022-12-31 DIAGNOSIS — M62830 Muscle spasm of back: Secondary | ICD-10-CM | POA: Diagnosis not present

## 2022-12-31 DIAGNOSIS — M542 Cervicalgia: Secondary | ICD-10-CM

## 2022-12-31 DIAGNOSIS — M5459 Other low back pain: Secondary | ICD-10-CM

## 2022-12-31 NOTE — Therapy (Signed)
OUTPATIENT PHYSICAL THERAPY THORACOLUMBAR TREATMENT   Patient Name: Linda Mcgrath MRN: [id number] DOB:01-31-1933, 89 y.o., female Today's Date: 12/31/2022   PT End of Session - 12/31/22 1300     Visit Number 38    Date for PT Re-Evaluation 01/09/23    Authorization Type [employer]    PT Start Time 1300    PT Stop Time 1348    PT Time Calculation (min) 48 min    Activity Tolerance Patient tolerated treatment well    Behavior During Therapy [facility] for tasks assessed/performed             Past Medical History:  Diagnosis Date   Allergic rhinitis    Arthritis    Asthma    Cataract    Bil/lens implant   Colon polyp 1999   small polyp   Diverticulosis    Dysrhythmia    Fibromyalgia    Hyperlipidemia    Hypothyroidism    Internal hemorrhoids    Leg cramps    Lichen planus    Lung nodule    stable LUL 9 mm   Menopausal syndrome    Osteoarthritis    UTI (urinary tract infection)    Past Surgical History:  Procedure Laterality Date   Abd U/S  11/1998   negative   Abd U/S  01/2001   gallbladder polyps   ABDOMINAL HYSTERECTOMY  1975   total-fibroids   APPENDECTOMY  1975   BREAST BIOPSY     benign   BREAST EXCISIONAL BIOPSY Left 1957   CARDIOVERSION N/A 03/07/2016   Procedure: CARDIOVERSION;  Surgeon: Yates Decamp, MD;  Location: [facility] ENDOSCOPY;  Service: Cardiovascular;  Laterality: N/A;   CATARACT EXTRACTION     bilateral   CHOLECYSTECTOMY     COLONOSCOPY  1998   Diverticulosis; polyp\   COLONOSCOPY  09/2002   Diverticulosis, hem   COLONOSCOPY  12/2007   diverticulosis, polyp   DEXA  10/2001   osteopenia   ELECTROPHYSIOLOGIC STUDY N/A 04/03/2016   Procedure: A-Flutter Ablation;  Surgeon: Will [name], MD;  Location: [facility] INVASIVE CV LAB;  Service: Cardiovascular;  Laterality: N/A;   ESOPHAGOGASTRODUODENOSCOPY  2004   Hida scan  01/2001   Negative   KNEE SURGERY     right knee / 11/2004   LAMINECTOMY WITH POSTERIOR LATERAL ARTHRODESIS LEVEL 2 N/A  07/31/2017   Procedure: DECOMPRESSIVE LAMINECTOMY LUMABR FOUR-FIVE, LUMBAR FIVE-SACRAL ONE;  Surgeon: Tia Alert, MD;  Location: [facility] OR;  Service: Neurosurgery;  Laterality: N/A;   laser surgery for glaucoma Left 09/21/2014   MULTIPLE TOOTH EXTRACTIONS     rentinal tear     ROTATOR CUFF REPAIR     x 2 /right shoulder   SKIN CANCER EXCISION     pre-melanoma / on face   TEE WITHOUT CARDIOVERSION N/A 03/07/2016   Procedure: TRANSESOPHAGEAL ECHOCARDIOGRAM (TEE);  Surgeon: Yates Decamp, MD;  Location: [facility] ENDOSCOPY;  Service: Cardiovascular;  Laterality: N/A;   TOE SURGERY     rt foot    Patient Active Problem List   Diagnosis Date Noted   Right shoulder pain 10/23/2022   Hematoma 10/23/2022   Acute cystitis without hematuria 03/26/2022   Vaginal itching 03/26/2022   Urinary frequency 03/26/2022   Vaginal candida 03/26/2022   Chronic upper back pain 08/30/2020   Aortic atherosclerosis (HCC) 05/31/2020   Abnormal CT scan, small bowel 05/18/2020   Insomnia 12/15/2019   Chronic low back pain 10/18/2019   Osteoarthritis of right knee 08/02/2019   S/P lumbar spinal  fusion 07/31/2017   H/O atrial flutter 03/06/2016   Obesity 11/06/2015   Urge incontinence 01/24/2015   Estrogen deficiency 10/25/2014   Lichen planus 12/20/2013   Prediabetes 06/03/2011   HYPERTENSION, BENIGN ESSENTIAL 10/23/2010   OSTEOARTHRITIS, HANDS, BILATERAL 01/24/2010   Hypothyroidism 08/24/2008   Vitamin D deficiency 06/06/2008   Hyperlipidemia 06/06/2008   MENOPAUSAL SYNDROME 03/29/2008   Osteoporosis 03/29/2008   Allergic rhinitis 01/25/2008   IBS 01/25/2008   OSTEOARTHRITIS 01/25/2008   FIBROMYALGIA 01/25/2008    PCP: [employer]  REFERRING PROVIDER: [name]  REFERRING DIAG: Back pain, difficulty walking, osteoporosis  Rationale for Evaluation and Treatment Rehabilitation  THERAPY DIAG:  Other low back pain  Muscle spasm of back  Difficulty in walking, not elsewhere classified  Cervicalgia  ONSET  DATE: September 2023  SUBJECTIVE:                                                                                                                                                                                           SUBJECTIVE STATEMENT: Doing okay, shoulder still can't go up very high, knees are achey, feeling like I can walk a little better PERTINENT HISTORY:  See above  PAIN:  Are you having pain? Yes: NPRS scale: 2/10 Pain location: knees Pain description: ache, sore Aggravating factors: yardwork, hosuework pain up to 8/10 Relieving factors: rest, pain can be 0/10 at times   PRECAUTIONS: None  WEIGHT BEARING RESTRICTIONS: No  FALLS:  Has patient fallen in last 6 months? No  LIVING ENVIRONMENT: Lives with: lives with their family and lives alone Lives in: House/apartment Stairs: No Has following equipment at home: Single point cane  OCCUPATION: retired  PLOF: Independent able to do yardwork and housework  PATIENT GOALS: walk better, less pain   OBJECTIVE:   DIAGNOSTIC FINDINGS:  osteoporosis  PATIENT SURVEYS:  FOTO 31  COGNITION:  Overall cognitive status: Within functional limits for tasks assessed     SENSATION: WFL  MUSCLE LENGTH: Tight calves, HS and piriformis  POSTURE: rounded shoulders, forward head, and decreased lumbar lordosis  PALPATION: She is tight and tender in the lumbar area and into the thoracic and upper trap area, mild tenderness in the buttocks  LUMBAR ROM:   AROM eval  Flexion Decreased 50%  Extension Decreased 100% with pain  Right lateral flexion   Left lateral flexion   Right rotation   Left rotation    (Blank rows = not tested)  LOWER EXTREMITY ROM:     Active  Right eval Left eval  Hip flexion    Hip extension    Hip abduction    Hip adduction  Hip internal rotation    Hip external rotation    Knee flexion    Knee extension    Ankle dorsiflexion    Ankle plantarflexion    Ankle inversion    Ankle  eversion     (Blank rows = not tested)  LOWER EXTREMITY MMT:    MMT Right eval Left eval Left /Right 12/10/22  Hip flexion 3+ 3+ 4-/4-  Hip extension     Hip abduction 3+ 3+ 4-/4-  Hip adduction     Hip internal rotation     Hip external rotation     Knee flexion 3+ with pain 3+ 4-/4-  Knee extension 3+ with pain 3+ 4+/4+  Ankle dorsiflexion 4- 4-   Ankle plantarflexion     Ankle inversion     Ankle eversion      (Blank rows = not tested)  FUNCTIONAL TESTS:  5 times sit to stand: 31 seconds: 10/01/22 30 seconds Timed up and go (TUG): 26 seconds with [facility]   07/25/22 TUG 22 seconds with SPC, 08/20/22 18 seconds with SPC, 10/01/22 23 seconds without device 11/05/22 with SPC 20 seconds 11/18/22 without device 16 seconds / 3 minute walk test = 350 feet 12/10/22 TUG no device 15 seconds / 3 minute walk test 375' Berg balance test:  35/56  GAIT: Distance walked: 60 feet Assistive device utilized: Single point cane Level of assistance: SBA Comments: slow, slightly stooped    TODAY'S TREATMENT:  [facility] Adult PT Treatment:                                                                                                                             12/31/22 Gait outside curbs uneven surface Talked about her getting a reacher/grabber to help with picking things up showed her an example On airex red tband rows and extension On airex reaching Side stepping on and off airex Backward walking, side stepping Tandem walk Stepping over wate bars  12/26/22 2.5# marches 2.5# hip abduction 2.5# LAQ 2.5# hip extension On airex standing, reaching, head turns with CGA Side stepping over stick Picking up cones, able to do if they are standing up, but really struggles if they are lying down, can't bend knees and then arthritic hands limit getting that low Negotiating around obstacles Standing ball toss  12/19/22 Nustep level 5 x 6 minutes Walk outside full parking island 3# LAQ 3# marches 3# hip  abduction Red tband ankle exercises Side stepping over objects Marching on airex  12/17/22 Walk outside 1/2 parking [place] Still limited with the knee pain On airex weight shifts and marches Standing volleyball Red tband HS curls 2.5# LAQ 2.5# marches Red tband clamshells  12/12/22 Nustep level 5 x 6 mintues Walking outside negotiating curbs, some uneven terrain, CGA 2.5# LAQ 2.5# marches 2.5# hip abduction Direction changes Ball toss but this caused some shoulder pain Red tband clamshells Ball b/n knees squeeze  12/10/22 Nustep level 5 x 7 minutes On  airex balance activities, reaching, head turns, weight shift, marches, cone toe touches with SPC and some HHA to at times CGA due to LOB Assessed Berg, TUG and 3 minute walk tests Side stepping, backward walking  12/05/22 Nustep level 5 x 7 minutes Red tband scapular stabilization 2 ways Red tband HS curls Standing 2# hip abduction Standing hip marches 2# Gait in clinic 240 feet without rest SBA with SPC, very slow 2# LAQ 3x10 Blue tband back extension Side stepping  12/03/22 Red tband HS curl, ankle motions all directions Red tband clamshell Nustep level 5 x 7 minutes Discussed her goals and needs see clinical impression below 2# LAQ 2# marches Ball b/n knees squeeze Ball in lap isometrics Blue band trunk extension  11/20/22 Red tband HS curl Red tband hip abduction Nustep level 5 x 7 minutes Ball squeeze  2.5# marches 2.5# LAQ\ Blue tband trunk extension Ball in lap isometric abs Gait 200 feet with SPC  PATIENT EDUCATION:  Education details: POC Person educated: Patient Education method: Explanation Education comprehension: verbalized understanding   HOME EXERCISE PROGRAM: TBD  ASSESSMENT:  CLINICAL IMPRESSION: Spoke with patient about grabber/reacher to help with her picking things up, showed her devices and how to use and where to get to help with her safety, I did more balance today the only  issue with this is she had some increased right knee pain, she has some bone on bone crepitus with some activites OBJECTIVE IMPAIRMENTS: Abnormal gait, cardiopulmonary status limiting activity, decreased activity tolerance, decreased balance, decreased endurance, decreased mobility, difficulty walking, decreased ROM, decreased strength, increased muscle spasms, impaired flexibility, postural dysfunction, and pain.    REHAB POTENTIAL: Good  CLINICAL DECISION MAKING: Stable/uncomplicated  EVALUATION COMPLEXITY: Low   GOALS: Goals reviewed with patient? Yes  SHORT TERM GOALS: Target date: 06/24/22  Independent with initial HEP Goal status: met  LONG TERM GOALS: Target date: 09/02/22  Decrease pain 50% Goal status: progressing 12/10/22  2.  Independent with an advanced HEP  Goal status:ongoing4/23/24  3.  Decrease TUG time to 13 seconds Goal status: progressing 12/10/22  4.  Increase LE strength to 4/5 Goal status: progressing 12/17/22  5.  Walk 10 minutes with SPC and without rest Goal status:  progressing 12/19/22  6:  increase berg balance test score to 45/56  PLAN: PT FREQUENCY: 1-2x/week  PT DURATION: 12 weeks  PLANNED INTERVENTIONS: Therapeutic exercises, Therapeutic activity, Neuromuscular re-education, Balance training, Gait training, Patient/Family education, Self Care, Joint mobilization, Stair training, Dry Needling, Electrical stimulation, Moist heat, and Manual therapy.  PLAN FOR NEXT SESSION:  will prepare her for D/C in the next week and talk about her safety and balance Laura Caldas W, PT 12/31/2022, 1:00 PM

## 2023-01-02 ENCOUNTER — Encounter: Payer: Self-pay | Admitting: Physical Therapy

## 2023-01-02 ENCOUNTER — Ambulatory Visit: Payer: Medicare Other | Admitting: Physical Therapy

## 2023-01-02 DIAGNOSIS — R262 Difficulty in walking, not elsewhere classified: Secondary | ICD-10-CM | POA: Diagnosis not present

## 2023-01-02 DIAGNOSIS — M62830 Muscle spasm of back: Secondary | ICD-10-CM

## 2023-01-02 DIAGNOSIS — M542 Cervicalgia: Secondary | ICD-10-CM | POA: Diagnosis not present

## 2023-01-02 DIAGNOSIS — M5459 Other low back pain: Secondary | ICD-10-CM
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
## 2023-01-07 ENCOUNTER — Ambulatory Visit: Payer: Medicare Other | Admitting: Physical Therapy

## 2023-01-13 ENCOUNTER — Encounter: Payer: Self-pay | Admitting: Family Medicine

## 2023-01-13 ENCOUNTER — Ambulatory Visit (INDEPENDENT_AMBULATORY_CARE_PROVIDER_SITE_OTHER): Payer: Medicare Other | Admitting: Family Medicine

## 2023-01-13 VITALS — BP 128/72 | HR 61 | Temp 97.8°F | Ht 62.0 in | Wt 161.5 lb

## 2023-01-13 DIAGNOSIS — M26623 Arthralgia of bilateral temporomandibular joint: Secondary | ICD-10-CM | POA: Diagnosis not present

## 2023-01-13 DIAGNOSIS — J069 Acute upper respiratory infection, unspecified: Secondary | ICD-10-CM

## 2023-01-13 DIAGNOSIS — M26629 Arthralgia of temporomandibular joint, unspecified side: Secondary | ICD-10-CM | POA: Insufficient documentation

## 2023-01-13 DIAGNOSIS — R0989 Other specified symptoms and signs involving the circulatory and respiratory systems: Secondary | ICD-10-CM

## 2023-01-13 LAB — POC COVID19 BINAXNOW: SARS Coronavirus 2 Ag: NEGATIVE

## 2023-01-13 NOTE — Assessment & Plan Note (Signed)
Mild symptoms day 2 with reassuring exam and normal pulse ox  Discussed symptoms care -see AVS ER precautions noted  Watch for wheezing/ fever/sob  Update if not starting to improve in a week or if worsening   Hanodut given

## 2023-01-13 NOTE — Patient Instructions (Signed)
Drink fluids and rest  mucinex DM is good for cough and congestion  Nasal saline for congestion as needed  Tylenol or advil with food  for fever or pain or headache  Please alert [place] if symptoms worsen (if severe or short of breath please go to the ER)    Covid test is negative today   Update if not starting to improve in a week or if worsening    For TMJ   Try warm and cool compresses on your jaw  Stick to soft foods No gum Sip through a straw  Don't chew a lot or open mouth wide  If not improved, see your dentist

## 2023-01-13 NOTE — Progress Notes (Signed)
Subjective:    Patient ID: Linda Mcgrath, female    DOB: [birth year], 89 y.o.   MRN: [id number]  HPI Pt presents for respiratory symptoms  Wt Readings from Last 3 Encounters:  01/13/23 161 lb 8 oz (73.3 kg)  10/23/22 167 lb 8 oz (76 kg)  10/03/22 170 lb (77.1 kg)   29.54 kg/m Vitals:   01/13/23 1558  BP: 128/72  Pulse: 61  Temp: 97.8 F (36.6 C)  SpO2: 97%   Symptoms started yesterday Did not sleep well  Very congested this am   Both runny and stuffy nose Clear to white  Throat is raw but not sore  Ears are ok  Headache on and off  / across forehead   Not a lot of cough  No phlegm so far  A little wheezing now and then   Her tissue under jaw is sore for 2 weeks   No fever    Over the counter: Advil  Multi symptom cold medicine   Results for orders placed or performed in visit on 01/13/23  POC COVID-19  Result Value Ref Range   SARS Coronavirus 2 Ag Negative Negative      Patient Active Problem List   Diagnosis Date Noted   TMJ arthralgia 01/13/2023   Right shoulder pain 10/23/2022   Hematoma 10/23/2022   Acute cystitis without hematuria 03/26/2022   Vaginal itching 03/26/2022   Urinary frequency 03/26/2022   Vaginal candida 03/26/2022   Chronic upper back pain 08/30/2020   Aortic atherosclerosis (HCC) 05/31/2020   Abnormal CT scan, small bowel 05/18/2020   Insomnia 12/15/2019   Chronic low back pain 10/18/2019   Osteoarthritis of right knee 08/02/2019   Viral URI with cough 10/05/2017   S/P lumbar spinal fusion 07/31/2017   H/O atrial flutter 03/06/2016   Obesity 11/06/2015   Urge incontinence 01/24/2015   Estrogen deficiency 10/25/2014   Lichen planus 12/20/2013   Prediabetes 06/03/2011   HYPERTENSION, BENIGN ESSENTIAL 10/23/2010   OSTEOARTHRITIS, HANDS, BILATERAL 01/24/2010   Hypothyroidism 08/24/2008   Vitamin D deficiency 06/06/2008   Hyperlipidemia 06/06/2008   MENOPAUSAL SYNDROME 03/29/2008   Osteoporosis 03/29/2008    Allergic rhinitis 01/25/2008   IBS 01/25/2008   OSTEOARTHRITIS 01/25/2008   FIBROMYALGIA 01/25/2008   Past Medical History:  Diagnosis Date   Allergic rhinitis    Arthritis    Asthma    Cataract    Bil/lens implant   Colon polyp 1999   small polyp   Diverticulosis    Dysrhythmia    Fibromyalgia    Hyperlipidemia    Hypothyroidism    Internal hemorrhoids    Leg cramps    Lichen planus    Lung nodule    stable LUL 9 mm   Menopausal syndrome    Osteoarthritis    UTI (urinary tract infection)    Past Surgical History:  Procedure Laterality Date   Abd U/S  11/1998   negative   Abd U/S  01/2001   gallbladder polyps   ABDOMINAL HYSTERECTOMY  1975   total-fibroids   APPENDECTOMY  1975   BREAST BIOPSY     benign   BREAST EXCISIONAL BIOPSY Left 1957   CARDIOVERSION N/A 03/07/2016   Procedure: CARDIOVERSION;  Surgeon: Yates Decamp, MD;  Location: [facility] ENDOSCOPY;  Service: Cardiovascular;  Laterality: N/A;   CATARACT EXTRACTION     bilateral   CHOLECYSTECTOMY     COLONOSCOPY  1998   Diverticulosis; polyp\   COLONOSCOPY  09/2002   Diverticulosis, hem  COLONOSCOPY  12/2007   diverticulosis, polyp   DEXA  10/2001   osteopenia   ELECTROPHYSIOLOGIC STUDY N/A 04/03/2016   Procedure: A-Flutter Ablation;  Surgeon: Will [name], MD;  Location: [facility] INVASIVE CV LAB;  Service: Cardiovascular;  Laterality: N/A;   ESOPHAGOGASTRODUODENOSCOPY  2004   Hida scan  01/2001   Negative   KNEE SURGERY     right knee / 11/2004   LAMINECTOMY WITH POSTERIOR LATERAL ARTHRODESIS LEVEL 2 N/A 07/31/2017   Procedure: DECOMPRESSIVE LAMINECTOMY LUMABR FOUR-FIVE, LUMBAR FIVE-SACRAL ONE;  Surgeon: Tia Alert, MD;  Location: [facility] OR;  Service: Neurosurgery;  Laterality: N/A;   laser surgery for glaucoma Left 09/21/2014   MULTIPLE TOOTH EXTRACTIONS     rentinal tear     ROTATOR CUFF REPAIR     x 2 /right shoulder   SKIN CANCER EXCISION     pre-melanoma / on face   TEE WITHOUT CARDIOVERSION  N/A 03/07/2016   Procedure: TRANSESOPHAGEAL ECHOCARDIOGRAM (TEE);  Surgeon: Yates Decamp, MD;  Location: [facility] ENDOSCOPY;  Service: Cardiovascular;  Laterality: N/A;   TOE SURGERY     rt foot    Social History   Tobacco Use   Smoking status: Never   Smokeless tobacco: Never  Vaping Use   Vaping Use: Never used  Substance Use Topics   Alcohol use: No    Alcohol/week: 0.0 standard drinks of alcohol    Comment: occasional-rare   Drug use: No   Family History  Problem Relation Age of Onset   Parkinsonism Father    Colon cancer Brother    Allergies Mother    Heart disease Mother    Kidney failure Son        congenital  (kidney transplant)    Heart failure Son        died suddenly of CHF    Allergies  Allergen Reactions   Shellfish Allergy Anaphylaxis and Nausea And Vomiting   Tetracycline Hives, Nausea And Vomiting, Rash and Other (See Comments)    SYSTEMIC REACTION: heart palpitations,muscle spasms, vomiting   Amlodipine Besylate Other (See Comments)    REACTION: muscle spasms, extremity swelling, low bp   Ciprofloxacin Other (See Comments)    REACTION: muscle spasms, insomnia   Codeine Other (See Comments)    REACTION: hallucinations   Propoxyphene Hcl Other (See Comments)    Unknown   Sulfamethoxazole-Trimethoprim Nausea Only and Other (See Comments)    REACTION: dizzy, could not focus eyes and could not concentrate and nausea   Tape Other (See Comments)    BANDAIDS TAKE OFF HER SKIN; PLEASE USE COBAN OR PAPER    Xarelto [Rivaroxaban] Other (See Comments)    Aches and pains and nervousness   Amoxicillin Rash   Other Swelling, Rash and Other (See Comments)    Has autoimmune disorder and spices erode her salivary glands and affects ankles and mouth DIRECTLY (takes off 1st layer of skin and leaves her unable to walk)   Penicillins Swelling, Rash and Other (See Comments)    Has patient had a PCN reaction causing immediate rash, facial/tongue/throat swelling, SOB or  lightheadedness with hypotension: Yes Has patient had a PCN reaction causing severe rash involving mucus membranes or skin necrosis: No Has patient had a PCN reaction that required hospitalization No Has patient had a PCN reaction occurring within the last 10 years: No If all of the above answers are "NO", then may proceed with Cephalosporin use.    Current Outpatient Medications on File Prior to Visit  Medication Sig Dispense Refill   alendronate (FOSAMAX) 70 MG tablet Take 1 tablet (70 mg total) by mouth every 7 (seven) days. Take with a full glass of water on an empty stomach. 4 tablet 11   Ascorbic Acid (VITAMIN C PO) Take 500 mg by mouth daily.     B Complex Vitamins (VITAMIN B COMPLEX PO) Take 1 tablet by mouth daily.     B COMPLEX, FOLIC ACID, PO Take 666 mcg by mouth daily.     Biotin 10 MG TABS Take 1 tablet by mouth every morning.      Calcium Citrate-Vitamin D 250-200 MG-UNIT TABS Take 1 tablet by mouth daily.     cholestyramine (QUESTRAN) 4 g packet Take 1 packet (4 g total) by mouth daily. MIX AND DRINK 1 PACKET BY MOUTH DAILY 90 each 3   Cranberry 180 MG CAPS Take 2 capsules by mouth daily.     dextromethorphan-guaiFENesin (MUCINEX DM) 30-600 MG 12hr tablet Take 1 tablet by mouth 2 (two) times daily. 15 tablet 0   Fexofenadine HCl (MUCINEX ALLERGY PO) Take 1 tablet by mouth daily as needed (Allergies).      fluconazole (DIFLUCAN) 150 MG tablet Take one pill by mouth today and repeat dose in 3 days 2 tablet 0   fluticasone (FLONASE) 50 MCG/ACT nasal spray Place 1 spray into both nostrils daily for 3 days. 16 g 0   folic acid (FOLVITE) 1 MG tablet Take 1 mg by mouth daily.     levothyroxine (SYNTHROID) 88 MCG tablet Take 1 tablet (88 mcg total) by mouth daily before breakfast. 90 tablet 2   loratadine (CLARITIN) 10 MG tablet Take 10 mg by mouth daily as needed for allergies.      MAGNESIUM-OXIDE PO Take 1 tablet by mouth daily.     Methenamine-Sodium Salicylate (AZO URINARY TRACT  DEFENSE PO) Take 1 capsule by mouth daily.     Multiple Vitamin (MULTIVITAMIN) capsule Take 1 capsule by mouth daily.     Psyllium (METAMUCIL FIBER PO) Take by mouth at bedtime. 2 tablespoon     Pumpkin Seed-Soy Germ (AZO BLADDER CONTROL/GO-LESS PO) Take 1 tablet by mouth 2 (two) times a week.     solifenacin (VESICARE) 5 MG tablet TAKE 1 TABLET(5 MG) BY MOUTH DAILY 30 tablet 11   TURMERIC PO Take 1 capsule by mouth 2 (two) times daily.      vitamin B-12 (CYANOCOBALAMIN) 500 MCG tablet Take 500 mcg by mouth daily.     Vitamin D, Cholecalciferol, 50 MCG (2000 UT) CAPS Take 2 tablets by mouth daily.     No current facility-administered medications on file prior to visit.      Review of Systems  Constitutional:  Negative for activity change, appetite change, fatigue, fever and unexpected weight change.  HENT:  Positive for congestion, postnasal drip, sneezing and voice change. Negative for drooling, ear discharge, ear pain, rhinorrhea, sinus pressure, sore throat and trouble swallowing.        Bilateral jaw pain  Eyes:  Negative for pain, discharge, redness and visual disturbance.  Respiratory:  Positive for cough and wheezing. Negative for shortness of breath and stridor.   Cardiovascular:  Negative for chest pain and palpitations.  Gastrointestinal:  Negative for abdominal pain, blood in stool, constipation, diarrhea, nausea and vomiting.  Endocrine: Negative for polydipsia and polyuria.  Genitourinary:  Negative for dysuria, frequency, hematuria and urgency.  Musculoskeletal:  Negative for arthralgias, back pain and myalgias.  Skin:  Negative for pallor and  rash.  Allergic/Immunologic: Negative for environmental allergies.  Neurological:  Positive for headaches. Negative for dizziness, syncope, facial asymmetry, weakness and light-headedness.  Hematological:  Negative for adenopathy. Does not bruise/bleed easily.  Psychiatric/Behavioral:  Negative for confusion, decreased concentration  and dysphoric mood. The patient is not nervous/anxious.        Objective:   Physical Exam Constitutional:      General: She is not in acute distress.    Appearance: Normal appearance. She is well-developed. She is obese. She is not ill-appearing, toxic-appearing or diaphoretic.  HENT:     Head: Normocephalic and atraumatic.     Comments: Nares are injected and congested     Bilateral TM joint tenderness without dislocation/ clicking or crepitus     Right Ear: Tympanic membrane, ear canal and external ear normal.     Left Ear: Tympanic membrane, ear canal and external ear normal.     Nose: Congestion and rhinorrhea present.     Mouth/Throat:     Mouth: Mucous membranes are moist.     Pharynx: Oropharynx is clear. No oropharyngeal exudate or posterior oropharyngeal erythema.     Comments: Clear pnd  Eyes:     General:        Right eye: No discharge.        Left eye: No discharge.     Conjunctiva/sclera: Conjunctivae normal.     Pupils: Pupils are equal, round, and reactive to light.  Cardiovascular:     Rate and Rhythm: Normal rate.     Heart sounds: Normal heart sounds.  Pulmonary:     Effort: Pulmonary effort is normal. No respiratory distress.     Breath sounds: Normal breath sounds. No stridor. No wheezing, rhonchi or rales.     Comments: Good air exch No wheeze even on forced exp Chest:     Chest wall: No tenderness.  Musculoskeletal:     Cervical back: Normal range of motion and neck supple.  Lymphadenopathy:     Cervical: No cervical adenopathy.  Skin:    General: Skin is warm and dry.     Capillary Refill: Capillary refill takes less than 2 seconds.     Findings: No rash.  Neurological:     Mental Status: She is alert.     Cranial Nerves: No cranial nerve deficit.     Coordination: Coordination normal.  Psychiatric:        Mood and Affect: Mood normal.           Assessment & Plan:   Problem List Items Addressed This Visit       Respiratory    Viral URI with cough - Primary    Mild symptoms day 2 with reassuring exam and normal pulse ox  Discussed symptoms care -see AVS ER precautions noted  Watch for wheezing/ fever/sob  Update if not starting to improve in a week or if worsening   Hanodut given        Musculoskeletal and Integument   TMJ arthralgia    Jaw pain with TM joint tenderness bilat  Handout given  Does not think she grinds   Recommend Heat/ice  Jaw rest- soft food/ no gum/ straws for drinking  Advil (if tolerated) or tylenol Update if not starting to improve in a week or if worsening   May need to check in with dentist if not improving       Other Visit Diagnoses     Chest congestion       Relevant  Orders   POC COVID-19 (Completed)

## 2023-01-13 NOTE — Assessment & Plan Note (Signed)
Jaw pain with TM joint tenderness bilat  Handout given  Does not think she grinds   Recommend Heat/ice  Jaw rest- soft food/ no gum/ straws for drinking  Advil (if tolerated) or tylenol Update if not starting to improve in a week or if worsening   May need to check in with dentist if not improving

## 2023-01-22 ENCOUNTER — Ambulatory Visit: Payer: Medicare Other | Admitting: Physical Therapy

## 2023-02-25 ENCOUNTER — Other Ambulatory Visit: Payer: Self-pay | Admitting: Family Medicine

## 2023-02-25 NOTE — Telephone Encounter (Signed)
LAST APPOINTMENT DATE: 01/22/22   NEXT APPOINTMENT DATE: Visit date not found    LAST REFILL: 01/22/22  QTY: 90 #2rf   Last lab done 11/07/21

## 2023-02-25 NOTE — Telephone Encounter (Signed)
Please schedule follow up and refill until then 

## 2023-02-26 ENCOUNTER — Other Ambulatory Visit: Payer: Self-pay | Admitting: Family Medicine

## 2023-02-26 NOTE — Telephone Encounter (Signed)
Patient has been scheduled

## 2023-03-10 ENCOUNTER — Ambulatory Visit: Payer: Medicare Other | Admitting: Family Medicine

## 2023-03-10 ENCOUNTER — Encounter: Payer: Self-pay | Admitting: Family Medicine

## 2023-03-10 VITALS — BP 130/78 | HR 87 | Temp 98.0°F | Ht 62.0 in | Wt 158.1 lb

## 2023-03-10 DIAGNOSIS — E7800 Pure hypercholesterolemia, unspecified: Secondary | ICD-10-CM | POA: Diagnosis not present

## 2023-03-10 DIAGNOSIS — R350 Frequency of micturition: Secondary | ICD-10-CM | POA: Diagnosis not present

## 2023-03-10 DIAGNOSIS — M1990 Unspecified osteoarthritis, unspecified site: Secondary | ICD-10-CM | POA: Diagnosis not present

## 2023-03-10 DIAGNOSIS — I700 Atherosclerosis of aorta: Secondary | ICD-10-CM

## 2023-03-10 DIAGNOSIS — R32 Unspecified urinary incontinence: Secondary | ICD-10-CM | POA: Diagnosis not present

## 2023-03-10 DIAGNOSIS — L570 Actinic keratosis: Secondary | ICD-10-CM | POA: Diagnosis not present

## 2023-03-10 DIAGNOSIS — E039 Hypothyroidism, unspecified: Secondary | ICD-10-CM | POA: Diagnosis not present

## 2023-03-10 DIAGNOSIS — R8290 Unspecified abnormal findings in urine: Secondary | ICD-10-CM | POA: Insufficient documentation

## 2023-03-10 DIAGNOSIS — M545 Low back pain, unspecified: Secondary | ICD-10-CM | POA: Diagnosis not present

## 2023-03-10 DIAGNOSIS — N3941 Urge incontinence: Secondary | ICD-10-CM

## 2023-03-10 DIAGNOSIS — E559 Vitamin D deficiency, unspecified: Secondary | ICD-10-CM

## 2023-03-10 DIAGNOSIS — G8929 Other chronic pain: Secondary | ICD-10-CM

## 2023-03-10 DIAGNOSIS — I10 Essential (primary) hypertension: Secondary | ICD-10-CM

## 2023-03-10 DIAGNOSIS — M810 Age-related osteoporosis without current pathological fracture: Secondary | ICD-10-CM

## 2023-03-10 LAB — CBC WITH DIFFERENTIAL/PLATELET
Basophils Absolute: 0 10*3/uL (ref 0.0–0.1)
Basophils Relative: 0.9 % (ref 0.0–3.0)
Eosinophils Absolute: 0.2 10*3/uL (ref 0.0–0.7)
Eosinophils Relative: 4.5 % (ref 0.0–5.0)
HCT: 43.5 % (ref 36.0–46.0)
Hemoglobin: 14.2 g/dL (ref 12.0–15.0)
Lymphocytes Relative: 41.3 % (ref 12.0–46.0)
Lymphs Abs: 2.2 10*3/uL (ref 0.7–4.0)
MCHC: 32.7 g/dL (ref 30.0–36.0)
MCV: 97.8 fl (ref 78.0–100.0)
Monocytes Absolute: 0.4 10*3/uL (ref 0.1–1.0)
Monocytes Relative: 7 % (ref 3.0–12.0)
Neutro Abs: 2.5 10*3/uL (ref 1.4–7.7)
Neutrophils Relative %: 46.3 % (ref 43.0–77.0)
Platelets: 283 10*3/uL (ref 150.0–400.0)
RBC: 4.45 Mil/uL (ref 3.87–5.11)
RDW: 13.3 % (ref 11.5–15.5)
WBC: 5.4 10*3/uL (ref 4.0–10.5)

## 2023-03-10 LAB — POC URINALSYSI DIPSTICK (AUTOMATED)
Bilirubin, UA: NEGATIVE
Glucose, UA: NEGATIVE
Ketones, UA: NEGATIVE
Nitrite, UA: NEGATIVE
Protein, UA: NEGATIVE
Spec Grav, UA: 1.015 (ref 1.010–1.025)
Urobilinogen, UA: 0.2 E.U./dL
pH, UA: 6 (ref 5.0–8.0)

## 2023-03-10 LAB — COMPREHENSIVE METABOLIC PANEL
ALT: 16 U/L (ref 0–35)
AST: 19 U/L (ref 0–37)
Albumin: 4.1 g/dL (ref 3.5–5.2)
Alkaline Phosphatase: 52 U/L (ref 39–117)
BUN: 14 mg/dL (ref 6–23)
CO2: 27 mEq/L (ref 19–32)
Calcium: 9.5 mg/dL (ref 8.4–10.5)
Chloride: 104 mEq/L (ref 96–112)
Creatinine, Ser: 0.87 mg/dL (ref 0.40–1.20)
GFR: 59.69 mL/min — ABNORMAL LOW (ref >60.00)
Glucose, Bld: 97 mg/dL (ref 70–99)
Potassium: 4.3 mEq/L (ref 3.5–5.1)
Sodium: 140 mEq/L (ref 135–145)
Total Bilirubin: 1 mg/dL (ref 0.2–1.2)
Total Protein: 6.7 g/dL (ref 6.0–8.3)

## 2023-03-10 LAB — LIPID PANEL
Cholesterol: 217 mg/dL — ABNORMAL HIGH (ref 0–200)
HDL: 56.2 mg/dL (ref >39.00)
LDL Cholesterol: 133 mg/dL — ABNORMAL HIGH (ref 0–99)
NonHDL: 160.56
Total CHOL/HDL Ratio: 4
Triglycerides: 138 mg/dL (ref 0.0–149.0)
VLDL: 27.6 mg/dL (ref 0.0–40.0)

## 2023-03-10 LAB — TSH: TSH: 6.65 u[IU]/mL — ABNORMAL HIGH (ref 0.35–5.50)

## 2023-03-10 MED ORDER — SOLIFENACIN SUCCINATE 10 MG PO TABS: 10.0000 mg | ORAL_TABLET | Freq: Every day | ORAL | 1 refills | Status: DC

## 2023-03-10 MED ORDER — METHOCARBAMOL 500 MG PO TABS: 500.0000 mg | ORAL_TABLET | Freq: Every evening | ORAL | 3 refills | Status: DC | PRN

## 2023-03-10 NOTE — Progress Notes (Signed)
Subjective:    Patient ID: Linda Mcgrath, female    DOB: Mar 03, 1933, 89 y.o.   MRN: [id number]  HPI  Wt Readings from Last 3 Encounters:  03/10/23 158 lb 2 oz (71.7 kg)  01/13/23 161 lb 8 oz (73.3 kg)  10/23/22 167 lb 8 oz (76 kg)   28.92 kg/m  Vitals:   03/10/23 0925 03/10/23 0953  BP: (!) 144/70 130/78  Pulse: 87   Temp: 98 F (36.7 C)   SpO2: 96%    Pt presents for follow up of chronic medical problems including HTN and hypothyroidism and osteoporosis  Also urinary incontinence  Also spots on her hands   Knee and back pain worsen with time  Wakes her up in the am  Uses a cane  Worse pain is midline - low back - had DDD and also a lot of facet arthritis    Was in PT -on/off when paid for     HTN bp is stable today  No cp or palpitations or headaches or edema  No medications / controlled by lifestyle  BP Readings from Last 3 Encounters:  03/10/23 130/78  01/13/23 128/72  10/23/22 133/80    No problems since last visit    Lab Results  Component Value Date   NA 139 11/07/2021   K 5.0 11/07/2021   CO2 29 11/07/2021   GLUCOSE 85 11/07/2021   BUN 18 11/07/2021   CREATININE 0.96 11/07/2021   CALCIUM 9.7 11/07/2021   GFR 53.54 (L) 11/07/2021   GFRNONAA 48 (L) 07/25/2017   History of aortic atherosclerosis  Lab Results  Component Value Date   CHOL 199 11/07/2021   HDL 61.30 11/07/2021   LDLCALC 111 (H) 11/07/2021   LDLDIRECT 125.7 06/09/2013   TRIG 129.0 11/07/2021   CHOLHDL 3 11/07/2021   Hypothyroidism  Pt has no clinical changes No change in energy level/ hair or skin/ edema and no tremor Lab Results  Component Value Date   TSH 3.05 11/07/2021    Levothyroxine 88 mcg daily   Dexa 04/2022  Taking alendronate since 04/2022  Falls-none Fractures-none Supplements vit D3 2000 international units daily  Exercise -limited due to pain/ does PT often  Takes vesicare 5 mg for urinary issues  Frequency is a lot worse lately and she  leaks more also  Urge incontinence   Results for orders placed or performed in visit on 03/10/23  POCT Urinalysis Dipstick (Automated)  Result Value Ref Range   Color, UA Yellow    Clarity, UA Clear    Glucose, UA Negative Negative   Bilirubin, UA Negative    Ketones, UA Negative    Spec Grav, UA 1.015 1.010 - 1.025   Blood, UA Trace    pH, UA 6.0 5.0 - 8.0   Protein, UA Negative Negative   Urobilinogen, UA 0.2 0.2 or 1.0 E.U./dL   Nitrite, UA Negative    Leukocytes, UA Small (1+) (A) Negative    Sm leuk/ tr blooc today  Culture pending     Patient Active Problem List   Diagnosis Date Noted   Abnormal urinalysis 03/10/2023   Actinic keratosis 03/10/2023   TMJ arthralgia 01/13/2023   Right shoulder pain 10/23/2022   Urinary frequency 03/26/2022   Chronic upper back pain 08/30/2020   Aortic atherosclerosis (HCC) 05/31/2020   Abnormal CT scan, small bowel 05/18/2020   Insomnia 12/15/2019   Chronic low back pain 10/18/2019   Osteoarthritis of right knee 08/02/2019   S/P lumbar spinal  fusion 07/31/2017   H/O atrial flutter 03/06/2016   Obesity 11/06/2015   Urge incontinence 01/24/2015   Estrogen deficiency 10/25/2014   Lichen planus 12/20/2013   Prediabetes 06/03/2011   HYPERTENSION, BENIGN ESSENTIAL 10/23/2010   OSTEOARTHRITIS, HANDS, BILATERAL 01/24/2010   Hypothyroidism 08/24/2008   Vitamin D deficiency 06/06/2008   Hyperlipidemia 06/06/2008   MENOPAUSAL SYNDROME 03/29/2008   Osteoporosis 03/29/2008   Allergic rhinitis 01/25/2008   IBS 01/25/2008   Osteoarthritis 01/25/2008   FIBROMYALGIA 01/25/2008   Past Medical History:  Diagnosis Date   Allergic rhinitis    Arthritis    Asthma    Cataract    Bil/lens implant   Colon polyp 1999   small polyp   Diverticulosis    Dysrhythmia    Fibromyalgia    Hyperlipidemia    Hypothyroidism    Internal hemorrhoids    Leg cramps    Lichen planus    Lung nodule    stable LUL 9 mm   Menopausal syndrome     Osteoarthritis    UTI (urinary tract infection)    Past Surgical History:  Procedure Laterality Date   Abd U/S  11/1998   negative   Abd U/S  01/2001   gallbladder polyps   ABDOMINAL HYSTERECTOMY  1975   total-fibroids   APPENDECTOMY  1975   BREAST BIOPSY     benign   BREAST EXCISIONAL BIOPSY Left 1957   CARDIOVERSION N/A 03/07/2016   Procedure: CARDIOVERSION;  Surgeon: Yates Decamp, MD;  Location: [facility] ENDOSCOPY;  Service: Cardiovascular;  Laterality: N/A;   CATARACT EXTRACTION     bilateral   CHOLECYSTECTOMY     COLONOSCOPY  1998   Diverticulosis; polyp\   COLONOSCOPY  09/2002   Diverticulosis, hem   COLONOSCOPY  12/2007   diverticulosis, polyp   DEXA  10/2001   osteopenia   ELECTROPHYSIOLOGIC STUDY N/A 04/03/2016   Procedure: A-Flutter Ablation;  Surgeon: Will [name], MD;  Location: [facility] INVASIVE CV LAB;  Service: Cardiovascular;  Laterality: N/A;   ESOPHAGOGASTRODUODENOSCOPY  2004   Hida scan  01/2001   Negative   KNEE SURGERY     right knee / 11/2004   LAMINECTOMY WITH POSTERIOR LATERAL ARTHRODESIS LEVEL 2 N/A 07/31/2017   Procedure: DECOMPRESSIVE LAMINECTOMY LUMABR FOUR-FIVE, LUMBAR FIVE-SACRAL ONE;  Surgeon: Tia Alert, MD;  Location: [facility] OR;  Service: Neurosurgery;  Laterality: N/A;   laser surgery for glaucoma Left 09/21/2014   MULTIPLE TOOTH EXTRACTIONS     rentinal tear     ROTATOR CUFF REPAIR     x 2 /right shoulder   SKIN CANCER EXCISION     pre-melanoma / on face   TEE WITHOUT CARDIOVERSION N/A 03/07/2016   Procedure: TRANSESOPHAGEAL ECHOCARDIOGRAM (TEE);  Surgeon: Yates Decamp, MD;  Location[facility] ENDOSCOPY;  Service: Cardiovascular;  Laterality: N/A;   TOE SURGERY     rt foot    Social History   Tobacco Use   Smoking status: Never   Smokeless tobacco: Never  Vaping Use   Vaping status: Never Used  Substance Use Topics   Alcohol use: No    Alcohol/week: 0.0 standard drinks of alcohol    Comment: occasional-rare   Drug use: No   Family  History  Problem Relation Age of Onset   Parkinsonism Father    Colon cancer Brother    Allergies Mother    Heart disease Mother    Kidney failure Son        congenital  (kidney transplant)  Heart failure Son        died suddenly of CHF    Allergies  Allergen Reactions   Shellfish Allergy Anaphylaxis and Nausea And Vomiting   Tetracycline Hives, Nausea And Vomiting, Rash and Other (See Comments)    SYSTEMIC REACTION: heart palpitations,muscle spasms, vomiting   Amlodipine Besylate Other (See Comments)    REACTION: muscle spasms, extremity swelling, low bp   Ciprofloxacin Other (See Comments)    REACTION: muscle spasms, insomnia   Codeine Other (See Comments)    REACTION: hallucinations   Propoxyphene Hcl Other (See Comments)    Unknown   Sulfamethoxazole-Trimethoprim Nausea Only and Other (See Comments)    REACTION: dizzy, could not focus eyes and could not concentrate and nausea   Tape Other (See Comments)    BANDAIDS TAKE OFF HER SKIN; PLEASE USE COBAN OR PAPER    Xarelto [Rivaroxaban] Other (See Comments)    Aches and pains and nervousness   Amoxicillin Rash   Other Swelling, Rash and Other (See Comments)    Has autoimmune disorder and spices erode her salivary glands and affects ankles and mouth DIRECTLY (takes off 1st layer of skin and leaves her unable to walk)   Penicillins Swelling, Rash and Other (See Comments)    Has patient had a PCN reaction causing immediate rash, facial/tongue/throat swelling, SOB or lightheadedness with hypotension: Yes Has patient had a PCN reaction causing severe rash involving mucus membranes or skin necrosis: No Has patient had a PCN reaction that required hospitalization No Has patient had a PCN reaction occurring within the last 10 years: No If all of the above answers are "NO", then may proceed with Cephalosporin use.    Current Outpatient Medications on File Prior to Visit  Medication Sig Dispense Refill   alendronate (FOSAMAX) 70  MG tablet Take 1 tablet (70 mg total) by mouth every 7 (seven) days. Take with a full glass of water on an empty stomach. 4 tablet 11   Ascorbic Acid (VITAMIN C PO) Take 500 mg by mouth daily.     B Complex Vitamins (VITAMIN B COMPLEX PO) Take 1 tablet by mouth daily.     B COMPLEX, FOLIC ACID, PO Take 666 mcg by mouth daily.     Biotin 10 MG TABS Take 1 tablet by mouth every morning.      Calcium Citrate-Vitamin D 250-200 MG-UNIT TABS Take 1 tablet by mouth daily.     cholestyramine (QUESTRAN) 4 g packet Take 1 packet (4 g total) by mouth daily. MIX AND DRINK 1 PACKET BY MOUTH DAILY 90 each 3   Cranberry 180 MG CAPS Take 2 capsules by mouth daily.     dextromethorphan-guaiFENesin (MUCINEX DM) 30-600 MG 12hr tablet Take 1 tablet by mouth 2 (two) times daily. 15 tablet 0   Fexofenadine HCl (MUCINEX ALLERGY PO) Take 1 tablet by mouth daily as needed (Allergies).      fluticasone (FLONASE) 50 MCG/ACT nasal spray Place 1 spray into both nostrils daily for 3 days. 16 g 0   folic acid (FOLVITE) 1 MG tablet Take 1 mg by mouth daily.     levothyroxine (SYNTHROID) 88 MCG tablet TAKE 1 TABLET(88 MCG) BY MOUTH DAILY BEFORE BREAKFAST 30 tablet 0   loratadine (CLARITIN) 10 MG tablet Take 10 mg by mouth daily as needed for allergies.      MAGNESIUM-OXIDE PO Take 1 tablet by mouth daily.     Multiple Vitamin (MULTIVITAMIN) capsule Take 1 capsule by mouth daily.  Psyllium (METAMUCIL FIBER PO) Take by mouth at bedtime. 2 tablespoon     TURMERIC PO Take 1 capsule by mouth 2 (two) times daily.      vitamin B-12 (CYANOCOBALAMIN) 500 MCG tablet Take 500 mcg by mouth daily.     Vitamin D, Cholecalciferol, 50 MCG (2000 UT) CAPS Take 2 tablets by mouth daily.     No current facility-administered medications on file prior to visit.    Review of Systems  Constitutional:  Negative for activity change, appetite change, fatigue, fever and unexpected weight change.  HENT:  Negative for congestion, ear pain,  rhinorrhea, sinus pressure and sore throat.   Eyes:  Negative for pain, redness and visual disturbance.  Respiratory:  Negative for cough, shortness of breath and wheezing.   Cardiovascular:  Negative for chest pain and palpitations.  Gastrointestinal:  Negative for abdominal pain, blood in stool, constipation and diarrhea.  Endocrine: Negative for polydipsia and polyuria.  Genitourinary:  Positive for frequency and urgency. Negative for dysuria.  Musculoskeletal:  Positive for arthralgias and back pain. Negative for myalgias.  Skin:  Negative for pallor and rash.  Allergic/Immunologic: Negative for environmental allergies.  Neurological:  Negative for dizziness, syncope and headaches.  Hematological:  Negative for adenopathy. Does not bruise/bleed easily.  Psychiatric/Behavioral:  Negative for decreased concentration and dysphoric mood. The patient is not nervous/anxious.        Objective:   Physical Exam Constitutional:      General: She is not in acute distress.    Appearance: Normal appearance. She is well-developed and normal weight. She is not ill-appearing or diaphoretic.  HENT:     Head: Normocephalic and atraumatic.     Mouth/Throat:     Mouth: Mucous membranes are moist.  Eyes:     Conjunctiva/sclera: Conjunctivae normal.     Pupils: Pupils are equal, round, and reactive to light.  Neck:     Thyroid: No thyromegaly.     Vascular: No carotid bruit or JVD.  Cardiovascular:     Rate and Rhythm: Normal rate and regular rhythm.     Heart sounds: Normal heart sounds.     No gallop.  Pulmonary:     Effort: Pulmonary effort is normal. No respiratory distress.     Breath sounds: Normal breath sounds. No wheezing or rales.  Abdominal:     General: There is no distension or abdominal bruit.     Palpations: Abdomen is soft. There is no mass.     Tenderness: There is no abdominal tenderness. There is no guarding or rebound.     Comments: No suprapubic tenderness or fullness   No cva tenderness   Musculoskeletal:     Cervical back: Normal range of motion and neck supple.     Right lower leg: No edema.     Left lower leg: No edema.     Comments: Limited rom spine and knees  Lymphadenopathy:     Cervical: No cervical adenopathy.  Skin:    General: Skin is warm and dry.     Coloration: Skin is not pale.     Findings: No rash.     Comments: Solar lentigines diffusely Some sks   AKs on bilateral dorsal hands   Neurological:     Mental Status: She is alert.     Coordination: Coordination normal.     Deep Tendon Reflexes: Reflexes are normal and symmetric. Reflexes normal.  Psychiatric:        Mood and Affect: Mood normal.  Assessment & Plan:   Problem List Items Addressed This Visit       Cardiovascular and Mediastinum   HYPERTENSION, BENIGN ESSENTIAL    bp in fair control at this time  BP Readings from Last 1 Encounters:  03/10/23 130/78   No changes needed, controlled with lifestyle habits Most recent labs reviewed  Disc lifstyle change with low sodium diet and exercise        Relevant Orders   TSH   Lipid panel   Comprehensive metabolic panel   CBC with Differential/Platelet   Aortic atherosclerosis (HCC)    Working to control blood pressure and cholesterol  No symptoms         Endocrine   Hypothyroidism    Hypothyroidism  Pt has no clinical changes No change in energy level/ hair or skin/ edema and no tremor Lab Results  Component Value Date   TSH 3.05 11/07/2021    Plan to continue levothyroxine 88 mcg daily TSH ordered today      Relevant Orders   TSH     Musculoskeletal and Integument   Osteoporosis    Dexa 04/2022 On alendronate since then and tol well  Continues vit D3 Also exercise as tolerated/ discussed options       Osteoarthritis    Ongoing in back and knees  Affects gait  Orthopedic referral done to discuss options for back pain first       Relevant Medications   methocarbamol  (ROBAXIN) 500 MG tablet   Actinic keratosis    Both hands/dorsal Ref to derm  Discussed importance of sun protection       Relevant Orders   Ambulatory referral to Dermatology     Other   Vitamin D deficiency    Vitamin D level is therapeutic with current supplementation Disc importance of this to bone and overall health Last vitamin D Lab Results  Component Value Date   VD25OH 52.68 11/07/2021         Urinary frequency    Urinalysis with trace blood and leuk Culture pending   Some overactive bladder as well      Urge incontinence    Ongoing  Will try increase her vesicare to 10 mg daily and update May need urology visit      Relevant Medications   solifenacin (VESICARE) 10 MG tablet   Hyperlipidemia    Disc goals for lipids and reasons to control them Rev last labs with pt Rev low sat fat diet in detail   Labs today       Chronic low back pain    Ref to ortho Past surgery  Interested in treatment options for pain      Relevant Medications   methocarbamol (ROBAXIN) 500 MG tablet   Other Relevant Orders   Ambulatory referral to Orthopedics   Abnormal urinalysis    Culture pending   Tr blood and sm leuk today      Relevant Orders   Urine Culture   Other Visit Diagnoses     Urinary incontinence, unspecified type    -  Primary   Relevant Medications   solifenacin (VESICARE) 10 MG tablet   Other Relevant Orders   POCT Urinalysis Dipstick (Automated) (Completed)

## 2023-03-10 NOTE — Assessment & Plan Note (Signed)
Urinalysis with trace blood and leuk Culture pending   Some overactive bladder as well

## 2023-03-10 NOTE — Assessment & Plan Note (Signed)
Vitamin D level is therapeutic with current supplementation Disc importance of this to bone and overall health Last vitamin D Lab Results  Component Value Date   VD25OH 52.68 11/07/2021

## 2023-03-10 NOTE — Assessment & Plan Note (Signed)
Ongoing in back and knees  Affects gait  Orthopedic referral done to discuss options for back pain first

## 2023-03-10 NOTE — Assessment & Plan Note (Signed)
Hypothyroidism  Pt has no clinical changes No change in energy level/ hair or skin/ edema and no tremor Lab Results  Component Value Date   TSH 3.05 11/07/2021    Plan to continue levothyroxine 88 mcg daily TSH ordered today

## 2023-03-10 NOTE — Assessment & Plan Note (Signed)
Both hands/dorsal Ref to derm  Discussed importance of sun protection

## 2023-03-10 NOTE — Assessment & Plan Note (Signed)
Disc goals for lipids and reasons to control them Rev last labs with pt Rev low sat fat diet in detail Labs today   

## 2023-03-10 NOTE — Assessment & Plan Note (Signed)
Working to control blood pressure and cholesterol  No symptoms

## 2023-03-10 NOTE — Assessment & Plan Note (Signed)
Ref to ortho Past surgery  Interested in treatment options for pain

## 2023-03-10 NOTE — Assessment & Plan Note (Signed)
Dexa 04/2022 On alendronate since then and tol well  Continues vit D3 Also exercise as tolerated/ discussed options

## 2023-03-10 NOTE — Assessment & Plan Note (Signed)
Ongoing  Will try increase her vesicare to 10 mg daily and update May need urology visit

## 2023-03-10 NOTE — Assessment & Plan Note (Signed)
Culture pending   Tr blood and sm leuk today

## 2023-03-10 NOTE — Patient Instructions (Addendum)
Let's go up on vesicare to 10 mg daily  If side effects let us know  We will also culture your urine and see if there is a uti   I put the referral in for orthopedics to discuss back (then knees)  Please let us know if you don't hear in 1-2 weeks   I put the referral in for dermatology to address the spots on your hands  Please let us know if you don't hear in 1-2 weeks   Try the muscle relaxer - methocarbamol (robaxin) at night for your back pain  It can sedate- use caution    Labs today

## 2023-03-10 NOTE — Assessment & Plan Note (Signed)
bp in fair control at this time  BP Readings from Last 1 Encounters:  03/10/23 130/78   No changes needed, controlled with lifestyle habits Most recent labs reviewed  Disc lifstyle change with low sodium diet and exercise

## 2023-03-11 LAB — URINE CULTURE
MICRO NUMBER:: 15200375
SPECIMEN QUALITY:: ADEQUATE

## 2023-03-13 ENCOUNTER — Telehealth: Payer: Self-pay

## 2023-03-13 ENCOUNTER — Other Ambulatory Visit: Payer: Self-pay | Admitting: Family Medicine

## 2023-03-13 DIAGNOSIS — E039 Hypothyroidism, unspecified: Secondary | ICD-10-CM

## 2023-03-13 MED ORDER — LEVOTHYROXINE SODIUM 100 MCG PO TABS: 100.0000 ug | ORAL_TABLET | Freq: Every day | ORAL | 1 refills | Status: DC

## 2023-03-13 NOTE — Telephone Encounter (Signed)
E-scribed levothyroxine 100 mcg daily #30 1 refill, per Dr. Milinda Antis (see Labs, Result Notes- 03/10/23).

## 2023-03-14 NOTE — Telephone Encounter (Signed)
Clarified medication question with pt and friend who was there helping

## 2023-03-14 NOTE — Telephone Encounter (Signed)
Patient called in and stated that she needs someone to call her and go over a list of things they wanted her to start doing regarding this medication. Thank you!

## 2023-03-27 ENCOUNTER — Other Ambulatory Visit: Payer: Self-pay | Admitting: Family Medicine

## 2023-03-27 ENCOUNTER — Other Ambulatory Visit: Payer: Medicare Other

## 2023-04-03 ENCOUNTER — Ambulatory Visit: Payer: Medicare Other | Admitting: Orthopedic Surgery

## 2023-04-22 ENCOUNTER — Other Ambulatory Visit: Payer: Self-pay | Admitting: Family Medicine

## 2023-04-23 ENCOUNTER — Encounter: Payer: Self-pay | Admitting: Family Medicine

## 2023-04-23 ENCOUNTER — Telehealth: Payer: Self-pay | Admitting: Family Medicine

## 2023-04-23 ENCOUNTER — Ambulatory Visit (INDEPENDENT_AMBULATORY_CARE_PROVIDER_SITE_OTHER): Payer: Medicare Other | Admitting: Family Medicine

## 2023-04-23 VITALS — BP 116/70 | HR 68 | Temp 97.7°F | Ht 62.0 in | Wt 155.2 lb

## 2023-04-23 DIAGNOSIS — R197 Diarrhea, unspecified: Secondary | ICD-10-CM | POA: Diagnosis not present

## 2023-04-23 DIAGNOSIS — N3941 Urge incontinence: Secondary | ICD-10-CM | POA: Diagnosis not present

## 2023-04-23 DIAGNOSIS — R8290 Unspecified abnormal findings in urine: Secondary | ICD-10-CM

## 2023-04-23 DIAGNOSIS — C44629 Squamous cell carcinoma of skin of left upper limb, including shoulder: Secondary | ICD-10-CM | POA: Insufficient documentation

## 2023-04-23 DIAGNOSIS — L989 Disorder of the skin and subcutaneous tissue, unspecified: Secondary | ICD-10-CM

## 2023-04-23 DIAGNOSIS — K529 Noninfective gastroenteritis and colitis, unspecified: Secondary | ICD-10-CM | POA: Insufficient documentation

## 2023-04-23 DIAGNOSIS — E039 Hypothyroidism, unspecified: Secondary | ICD-10-CM

## 2023-04-23 DIAGNOSIS — R32 Unspecified urinary incontinence: Secondary | ICD-10-CM

## 2023-04-23 LAB — BASIC METABOLIC PANEL
BUN: 10 mg/dL (ref 6–23)
CO2: 28 mEq/L (ref 19–32)
Calcium: 9.3 mg/dL (ref 8.4–10.5)
Chloride: 105 mEq/L (ref 96–112)
Creatinine, Ser: 0.86 mg/dL (ref 0.40–1.20)
GFR: 60.47 mL/min (ref >60.00)
Glucose, Bld: 105 mg/dL — ABNORMAL HIGH (ref 70–99)
Potassium: 4.4 mEq/L (ref 3.5–5.1)
Sodium: 139 mEq/L (ref 135–145)

## 2023-04-23 LAB — CBC WITH DIFFERENTIAL/PLATELET
Basophils Absolute: 0 10*3/uL (ref 0.0–0.1)
Basophils Relative: 0.6 % (ref 0.0–3.0)
Eosinophils Absolute: 0.1 10*3/uL (ref 0.0–0.7)
Eosinophils Relative: 2.1 % (ref 0.0–5.0)
HCT: 43.8 % (ref 36.0–46.0)
Hemoglobin: 14.1 g/dL (ref 12.0–15.0)
Lymphocytes Relative: 43.6 % (ref 12.0–46.0)
Lymphs Abs: 2.9 10*3/uL (ref 0.7–4.0)
MCHC: 32.3 g/dL (ref 30.0–36.0)
MCV: 97.2 fl (ref 78.0–100.0)
Monocytes Absolute: 0.6 10*3/uL (ref 0.1–1.0)
Monocytes Relative: 9.6 % (ref 3.0–12.0)
Neutro Abs: 2.9 10*3/uL (ref 1.4–7.7)
Neutrophils Relative %: 44.1 % (ref 43.0–77.0)
Platelets: 283 10*3/uL (ref 150.0–400.0)
RBC: 4.51 Mil/uL (ref 3.87–5.11)
RDW: 12.8 % (ref 11.5–15.5)
WBC: 6.6 10*3/uL (ref 4.0–10.5)

## 2023-04-23 LAB — HEPATIC FUNCTION PANEL
ALT: 14 U/L (ref 0–35)
AST: 17 U/L (ref 0–37)
Albumin: 3.9 g/dL (ref 3.5–5.2)
Alkaline Phosphatase: 57 U/L (ref 39–117)
Bilirubin, Direct: 0.1 mg/dL (ref 0.0–0.3)
Total Bilirubin: 0.9 mg/dL (ref 0.2–1.2)
Total Protein: 6.6 g/dL (ref 6.0–8.3)

## 2023-04-23 LAB — POC URINALSYSI DIPSTICK (AUTOMATED)
Bilirubin, UA: NEGATIVE
Blood, UA: NEGATIVE
Glucose, UA: NEGATIVE
Ketones, UA: NEGATIVE
Nitrite, UA: NEGATIVE
Protein, UA: NEGATIVE
Spec Grav, UA: 1.01 (ref 1.010–1.025)
Urobilinogen, UA: 0.2 E.U./dL
pH, UA: 6 (ref 5.0–8.0)

## 2023-04-23 NOTE — Assessment & Plan Note (Signed)
Hyperkeratotic areas/one waxy on dorsal left hand  Suspect possible early skin cancer vs wart  Suggest follow up with dermatology next month as planned

## 2023-04-23 NOTE — Assessment & Plan Note (Addendum)
No improvement with vesicare  Ucx pending  Ref to urology Had hysterectomy in past

## 2023-04-23 NOTE — Patient Instructions (Addendum)
Get back on daily questran (cholestyramine) from the GI doctor for diarrhea  Keep watching your diet  Let us know if any abdominal pain or other symptoms  If diarrhea gets worse let us know   We will check labs today for that    We will check a urine culture to make sure you don't have a new uti  I will refer you to urology for the frequent urination and incontinence  I put the referral in  Please let us know if you don't hear in 1-2 weeks   The lesion on your hand may be a wart or skin cancer Keep your dermatology appointment for next month  Protect it from trauma   Take care of yourself  Stay hydrated

## 2023-04-23 NOTE — Progress Notes (Signed)
Subjective:    Patient ID: Linda Mcgrath, female    DOB: 1933-06-04, 89 y.o.   MRN: [id number]  HPI  Wt Readings from Last 3 Encounters:  04/23/23 155 lb 4 oz (70.4 kg)  03/10/23 158 lb 2 oz (71.7 kg)  01/13/23 161 lb 8 oz (73.3 kg)   28.40 kg/m  Vitals:   04/23/23 1149  BP: 116/70  Pulse: 68  Temp: 97.7 F (36.5 C)  SpO2: 97%     Pt presents for urinary incontinence   Lesion on hand  Has derm appt with Dr Caralyn Guile on 9/26   Diarrhea     Urge incontinence Overactive bladder We increase her vesicare from 5 to 10 mg daily  Did not help much  Has to urinate every 2 hours around the clock   Urinalysis today Results for orders placed or performed in visit on 04/23/23  POCT Urinalysis Dipstick (Automated)  Result Value Ref Range   Color, UA Yellow    Clarity, UA Clear    Glucose, UA Negative Negative   Bilirubin, UA Negative    Ketones, UA Negative    Spec Grav, UA 1.010 1.010 - 1.025   Blood, UA Negative    pH, UA 6.0 5.0 - 8.0   Protein, UA Negative Negative   Urobilinogen, UA 0.2 0.2 or 1.0 E.U./dL   Nitrite, UA Negative    Leukocytes, UA Moderate (2+) (A) Negative      Had diarrhea about 10 days ago -uncontrollable  Stool was dark  Took quite a while to clean up  This improved  Still tired   Low energy in general  Did not eat anything out of the ordinary  No abd pain   No pepto  No iron   Has questran on list  Takes 4 g as needed     Last month Urinalysis noted small leukocytes and tr blood  Urine culture noted some contamination but no uti   Want to re check this today     Lab Results  Component Value Date   NA 140 03/10/2023   K 4.3 03/10/2023   CO2 27 03/10/2023   GLUCOSE 97 03/10/2023   BUN 14 03/10/2023   CREATININE 0.87 03/10/2023   CALCIUM 9.5 03/10/2023   GFR 59.69 (L) 03/10/2023   GFRNONAA 48 (L) 07/25/2017   Lab Results  Component Value Date   HGBA1C 5.3 11/07/2021    Lab Results  Component Value  Date   WBC 5.4 03/10/2023   HGB 14.2 03/10/2023   HCT 43.5 03/10/2023   MCV 97.8 03/10/2023   PLT 283.0 03/10/2023      Patient Active Problem List   Diagnosis Date Noted   Acute diarrhea 04/23/2023   Skin lesion of hand 04/23/2023   Abnormal urinalysis 03/10/2023   Actinic keratosis 03/10/2023   TMJ arthralgia 01/13/2023   Right shoulder pain 10/23/2022   Urinary frequency 03/26/2022   Chronic upper back pain 08/30/2020   Aortic atherosclerosis (HCC) 05/31/2020   Abnormal CT scan, small bowel 05/18/2020   Insomnia 12/15/2019   Chronic low back pain 10/18/2019   Osteoarthritis of right knee 08/02/2019   S/P lumbar spinal fusion 07/31/2017   H/O atrial flutter 03/06/2016   Obesity 11/06/2015   Urge incontinence 01/24/2015   Estrogen deficiency 10/25/2014   Lichen planus 12/20/2013   Prediabetes 06/03/2011   HYPERTENSION, BENIGN ESSENTIAL 10/23/2010   OSTEOARTHRITIS, HANDS, BILATERAL 01/24/2010   Hypothyroidism 08/24/2008   Vitamin D deficiency 06/06/2008   Hyperlipidemia  06/06/2008   MENOPAUSAL SYNDROME 03/29/2008   Osteoporosis 03/29/2008   Allergic rhinitis 01/25/2008   IBS 01/25/2008   Osteoarthritis 01/25/2008   FIBROMYALGIA 01/25/2008   Past Medical History:  Diagnosis Date   Allergic rhinitis    Arthritis    Asthma    Cataract    Bil/lens implant   Colon polyp 1999   small polyp   Diverticulosis    Dysrhythmia    Fibromyalgia    Hyperlipidemia    Hypothyroidism    Internal hemorrhoids    Leg cramps    Lichen planus    Lung nodule    stable LUL 9 mm   Menopausal syndrome    Osteoarthritis    UTI (urinary tract infection)    Past Surgical History:  Procedure Laterality Date   Abd U/S  11/1998   negative   Abd U/S  01/2001   gallbladder polyps   ABDOMINAL HYSTERECTOMY  1975   total-fibroids   APPENDECTOMY  1975   BREAST BIOPSY     benign   BREAST EXCISIONAL BIOPSY Left 1957   CARDIOVERSION N/A 03/07/2016   Procedure: CARDIOVERSION;   Surgeon: Yates Decamp, MD;  Location: [facility] ENDOSCOPY;  Service: Cardiovascular;  Laterality: N/A;   CATARACT EXTRACTION     bilateral   CHOLECYSTECTOMY     COLONOSCOPY  1998   Diverticulosis; polyp\   COLONOSCOPY  09/2002   Diverticulosis, hem   COLONOSCOPY  12/2007   diverticulosis, polyp   DEXA  10/2001   osteopenia   ELECTROPHYSIOLOGIC STUDY N/A 04/03/2016   Procedure: A-Flutter Ablation;  Surgeon: Will [name], MD;  Location: [facility] INVASIVE CV LAB;  Service: Cardiovascular;  Laterality: N/A;   ESOPHAGOGASTRODUODENOSCOPY  2004   Hida scan  01/2001   Negative   KNEE SURGERY     right knee / 11/2004   LAMINECTOMY WITH POSTERIOR LATERAL ARTHRODESIS LEVEL 2 N/A 07/31/2017   Procedure: DECOMPRESSIVE LAMINECTOMY LUMABR FOUR-FIVE, LUMBAR FIVE-SACRAL ONE;  Surgeon: Tia Alert, MD;  Location: [facility] OR;  Service: Neurosurgery;  Laterality: N/A;   laser surgery for glaucoma Left 09/21/2014   MULTIPLE TOOTH EXTRACTIONS     rentinal tear     ROTATOR CUFF REPAIR     x 2 /right shoulder   SKIN CANCER EXCISION     pre-melanoma / on face   TEE WITHOUT CARDIOVERSION N/A 03/07/2016   Procedure: TRANSESOPHAGEAL ECHOCARDIOGRAM (TEE);  Surgeon: Yates Decamp, MD;  Location: [facility] ENDOSCOPY;  Service: Cardiovascular;  Laterality: N/A;   TOE SURGERY     rt foot    Social History   Tobacco Use   Smoking status: Never   Smokeless tobacco: Never  Vaping Use   Vaping status: Never Used  Substance Use Topics   Alcohol use: No    Alcohol/week: 0.0 standard drinks of alcohol    Comment: occasional-rare   Drug use: No   Family History  Problem Relation Age of Onset   Parkinsonism Father    Colon cancer Brother    Allergies Mother    Heart disease Mother    Kidney failure Son        congenital  (kidney transplant)    Heart failure Son        died suddenly of CHF    Allergies  Allergen Reactions   Shellfish Allergy Anaphylaxis and Nausea And Vomiting   Tetracycline Hives, Nausea And Vomiting,  Rash and Other (See Comments)    SYSTEMIC REACTION: heart palpitations,muscle spasms, vomiting   Amlodipine Besylate Other (  See Comments)    REACTION: muscle spasms, extremity swelling, low bp   Ciprofloxacin Other (See Comments)    REACTION: muscle spasms, insomnia   Codeine Other (See Comments)    REACTION: hallucinations   Propoxyphene Hcl Other (See Comments)    Unknown   Sulfamethoxazole-Trimethoprim Nausea Only and Other (See Comments)    REACTION: dizzy, could not focus eyes and could not concentrate and nausea   Tape Other (See Comments)    BANDAIDS TAKE OFF HER SKIN; PLEASE USE COBAN OR PAPER    Xarelto [Rivaroxaban] Other (See Comments)    Aches and pains and nervousness   Amoxicillin Rash   Other Swelling, Rash and Other (See Comments)    Has autoimmune disorder and spices erode her salivary glands and affects ankles and mouth DIRECTLY (takes off 1st layer of skin and leaves her unable to walk)   Penicillins Swelling, Rash and Other (See Comments)    Has patient had a PCN reaction causing immediate rash, facial/tongue/throat swelling, SOB or lightheadedness with hypotension: Yes Has patient had a PCN reaction causing severe rash involving mucus membranes or skin necrosis: No Has patient had a PCN reaction that required hospitalization No Has patient had a PCN reaction occurring within the last 10 years: No If all of the above answers are "NO", then may proceed with Cephalosporin use.    Current Outpatient Medications on File Prior to Visit  Medication Sig Dispense Refill   alendronate (FOSAMAX) 70 MG tablet TAKE 1 TABLET(70 MG) BY MOUTH EVERY 7 DAYS WITH A FULL GLASS OF WATER AND ON AN EMPTY STOMACH 12 tablet 1   Ascorbic Acid (VITAMIN C PO) Take 500 mg by mouth daily.     B Complex Vitamins (VITAMIN B COMPLEX PO) Take 1 tablet by mouth daily.     B COMPLEX, FOLIC ACID, PO Take 666 mcg by mouth daily.     Biotin 10 MG TABS Take 1 tablet by mouth every morning.       Calcium Citrate-Vitamin D 250-200 MG-UNIT TABS Take 1 tablet by mouth daily.     cholestyramine (QUESTRAN) 4 g packet Take 1 packet (4 g total) by mouth daily. MIX AND DRINK 1 PACKET BY MOUTH DAILY 90 each 3   Cranberry 180 MG CAPS Take 2 capsules by mouth daily.     dextromethorphan-guaiFENesin (MUCINEX DM) 30-600 MG 12hr tablet Take 1 tablet by mouth 2 (two) times daily. 15 tablet 0   Fexofenadine HCl (MUCINEX ALLERGY PO) Take 1 tablet by mouth daily as needed (Allergies).      fluticasone (FLONASE) 50 MCG/ACT nasal spray Place 1 spray into both nostrils daily for 3 days. 16 g 0   folic acid (FOLVITE) 1 MG tablet Take 1 mg by mouth daily.     levothyroxine (SYNTHROID) 100 MCG tablet Take 1 tablet (100 mcg total) by mouth daily before breakfast. 30 tablet 1   loratadine (CLARITIN) 10 MG tablet Take 10 mg by mouth daily as needed for allergies.      MAGNESIUM-OXIDE PO Take 1 tablet by mouth daily.     methocarbamol (ROBAXIN) 500 MG tablet Take 1 tablet (500 mg total) by mouth at bedtime as needed for muscle spasms (back pain). 30 tablet 3   Multiple Vitamin (MULTIVITAMIN) capsule Take 1 capsule by mouth daily.     Psyllium (METAMUCIL FIBER PO) Take by mouth at bedtime. 2 tablespoon     solifenacin (VESICARE) 10 MG tablet Take 1 tablet (10 mg total) by mouth daily.  90 tablet 1   TURMERIC PO Take 1 capsule by mouth 2 (two) times daily.      vitamin B-12 (CYANOCOBALAMIN) 500 MCG tablet Take 500 mcg by mouth daily.     Vitamin D, Cholecalciferol, 50 MCG (2000 UT) CAPS Take 2 tablets by mouth daily.     No current facility-administered medications on file prior to visit.    Review of Systems  Constitutional:  Positive for fatigue. Negative for activity change, appetite change, fever and unexpected weight change.  HENT:  Negative for congestion, ear pain, rhinorrhea, sinus pressure and sore throat.   Eyes:  Negative for pain, redness and visual disturbance.  Respiratory:  Negative for cough,  shortness of breath and wheezing.   Cardiovascular:  Negative for chest pain and palpitations.  Gastrointestinal:  Positive for diarrhea. Negative for abdominal pain, anal bleeding, blood in stool, constipation, nausea, rectal pain and vomiting.  Endocrine: Negative for polydipsia and polyuria.  Genitourinary:  Positive for frequency and urgency. Negative for difficulty urinating, dysuria, vaginal discharge and vaginal pain.  Musculoskeletal:  Negative for arthralgias, back pain and myalgias.  Skin:  Negative for pallor and rash.  Allergic/Immunologic: Negative for environmental allergies.  Neurological:  Negative for dizziness, syncope and headaches.  Hematological:  Negative for adenopathy. Does not bruise/bleed easily.  Psychiatric/Behavioral:  Negative for decreased concentration and dysphoric mood. The patient is not nervous/anxious.        Objective:   Physical Exam Constitutional:      General: She is not in acute distress.    Appearance: Normal appearance. She is well-developed and normal weight. She is not ill-appearing or diaphoretic.  HENT:     Head: Normocephalic and atraumatic.     Mouth/Throat:     Mouth: Mucous membranes are moist.  Eyes:     General: No scleral icterus.    Conjunctiva/sclera: Conjunctivae normal.     Pupils: Pupils are equal, round, and reactive to light.  Neck:     Thyroid: No thyromegaly.     Vascular: No carotid bruit or JVD.  Cardiovascular:     Rate and Rhythm: Normal rate and regular rhythm.     Heart sounds: Normal heart sounds.     No gallop.  Pulmonary:     Effort: Pulmonary effort is normal. No respiratory distress.     Breath sounds: Normal breath sounds. No wheezing or rales.  Abdominal:     General: Abdomen is protuberant. There is no distension or abdominal bruit.     Palpations: Abdomen is soft. There is no fluid wave, hepatomegaly, splenomegaly or pulsatile mass.     Tenderness: There is abdominal tenderness in the periumbilical  area. There is no right CVA tenderness, left CVA tenderness, guarding or rebound. Negative signs include Murphy's sign and McBurney's sign.     Comments: Very mild periumb tenderness  No suprapubic tenderness or fullness    Musculoskeletal:     Cervical back: Normal range of motion and neck supple.     Right lower leg: No edema.     Left lower leg: No edema.  Lymphadenopathy:     Cervical: No cervical adenopathy.  Skin:    General: Skin is warm and dry.     Coloration: Skin is not pale.     Findings: No rash.     Comments: Dorsal left hand  Aks  Also waxy raised oval lesion /hyperkeratotic   Some sks diffusely  Solar lentigines diffusely   Neurological:     Mental Status:  She is alert.     Coordination: Coordination normal.     Deep Tendon Reflexes: Reflexes are normal and symmetric. Reflexes normal.  Psychiatric:        Mood and Affect: Mood normal.           Assessment & Plan:   Problem List Items Addressed This Visit       Digestive   Acute diarrhea    This is acute on chronic Per pt had some dark stools-now resolved  Encouraged her to take the cholestyramine from gi Lab today  Reassuring exam      Relevant Orders   Basic metabolic panel   CBC with Differential/Platelet   Hepatic function panel     Musculoskeletal and Integument   Skin lesion of hand    Hyperkeratotic areas/one waxy on dorsal left hand  Suspect possible early skin cancer vs wart  Suggest follow up with dermatology next month as planned        Other   Abnormal urinalysis    Today some leuks/no rbc Will re culture Ref to urology for urge incontinence/oab      Relevant Orders   Urine Culture   Ambulatory referral to Urology   Urge incontinence - Primary    No improvement with vesicare  Ucx pending  Ref to urology Had hysterectomy in past       Relevant Orders   Ambulatory referral to Urology   Other Visit Diagnoses     Urinary incontinence, unspecified type        Relevant Orders   POCT Urinalysis Dipstick (Automated) (Completed)

## 2023-04-23 NOTE — Assessment & Plan Note (Signed)
This is acute on chronic Per pt had some dark stools-now resolved  Encouraged her to take the cholestyramine from gi Lab today  Reassuring exam

## 2023-04-23 NOTE — Telephone Encounter (Signed)
-----   Message from Alvina Chou sent at 04/10/2023 12:17 PM EDT ----- Regarding: Lab orders for Friday, 8.30.24 Lab orders for TSH?

## 2023-04-23 NOTE — Assessment & Plan Note (Signed)
Today some leuks/no rbc Will re culture Ref to urology for urge incontinence/oab

## 2023-04-24 ENCOUNTER — Other Ambulatory Visit (INDEPENDENT_AMBULATORY_CARE_PROVIDER_SITE_OTHER): Payer: Medicare Other

## 2023-04-24 ENCOUNTER — Ambulatory Visit (INDEPENDENT_AMBULATORY_CARE_PROVIDER_SITE_OTHER): Payer: Medicare Other | Admitting: Orthopedic Surgery

## 2023-04-24 DIAGNOSIS — G8929 Other chronic pain: Secondary | ICD-10-CM

## 2023-04-24 DIAGNOSIS — M545 Low back pain, unspecified: Secondary | ICD-10-CM | POA: Diagnosis not present

## 2023-04-24 NOTE — Progress Notes (Signed)
Orthopedic Spine Surgery Office Note  Assessment: Patient is a 89 y.o. female with low back pain. Has developed bowel/bladder incontinence within the last six months - possible neurologic in origin but seems more functional based on her description. It is also more chronic so I do not think a stat mri is needed   Plan: -Patient has tried tylenol, PT, ibuprofen  -Told her she can take 1000mg  of tylenol up to three times per day for pain -Patient has tried conservative treatment now for over six weeks and has incontinence (although unclear if this is neurologic in nature and at this point more chronic), so recommended MRI of the lumbar spine to evaluate further -Patient should return to office in 4 weeks, x-rays at next visit: none   Patient expressed understanding of the plan and all questions were answered to the patient's satisfaction.   ___________________________________________________________________________   History:  Patient is a 89 y.o. female who presents today for lumbar spine. Patient has had pain for several months now in her lower back. There was no trauma or injury that preceded the onset of pain. Pain is worse at night and sometimes she has difficulty sleeping as a result of the pain. She also has bilateral knee pain but she does not feel that it radiates from her back to her knees. She also mostly experiences the knee pain with weight bearing. No other lower extremity pain.    Weakness: denies Symptoms of imbalance: denies Paresthesias and numbness: denies Bowel or bladder incontinence: has had 5-6 months of bowel and bladder incontinence. She described more of a functional incontinence. She said it started after surgery but then could not recall what surgery she had. She also was then not sure if it had been longer or shorter than 5-6 months. Saddle anesthesia: denies  Treatments tried: PT, tylenol, ibuprofen  Review of systems: Denies fevers and chills, night sweats,  unexplained weight loss. Has had pain that wakes her at night, has a history of skin cancer that was excised  Past medical history: Fibromyalgia Ulcerative colitis Atrial fibrillation Allergic rhinitis Diverticulosis HLD Hypothyroidism  Allergies: tetracycline, ciprofloxacin, amlodipine, sulfa, adhesive tape, xarelto, amoxicillin, pencillin  Past surgical history:  Lumbar spine surgery Hysterectomy Brest excisional biopsy Appendectomy Bilateral cataract surgery Glaucoma surgery Right shoulder rotator cuff repair  Skin cancer excision Right toe surgery  Social history: Denies use of nicotine product (smoking, vaping, patches, smokeless) Alcohol use: denies Denies recreational drug use   Physical Exam:  BMI of 28.4  General: no acute distress, appears stated age Neurologic: alert, answering questions appropriately, following commands Respiratory: unlabored breathing on room air, symmetric chest rise Psychiatric: appropriate affect, normal cadence to speech   MSK (spine):  -Strength exam      Left  Right EHL    5/5  5/5 TA    5/5  5/5 GSC    5/5  5/5 Knee extension  5/5  5/5 Hip flexion   5/5  5/5  -Sensory exam    Sensation intact to light touch in L3-S1 nerve distributions of bilateral lower extremities  -Achilles DTR: 1/4 on the left, 1/4 on the right -Patellar tendon DTR: 1/4 on the left, 1/4 on the right  -Straight leg raise: negative bilaterally -Femoral nerve stretch test: negative bilaterally -Clonus: no beats bilaterally  -Left hip exam: no pain through range of motion, negative faber, negative stinchfield -Right hip exam: no pain through range of motion, negative faber, negative stinchfield  Imaging: XRs of the lumbar spine from  04/24/2023 were independently reviewed and interpreted, showing spondylolisthesis at L4/5 that shifts about 1mm between flexion and extension views. Disc height loss at L3/4 and L4/5. No fracture or dislocation seen.      Patient name: Linda Mcgrath Patient MRN: [id number] Date of visit: 04/24/23

## 2023-04-25 ENCOUNTER — Other Ambulatory Visit: Payer: Medicare Other

## 2023-04-25 LAB — URINE CULTURE
MICRO NUMBER:: 15393896
SPECIMEN QUALITY:: ADEQUATE

## 2023-05-04 ENCOUNTER — Other Ambulatory Visit: Payer: Self-pay | Admitting: Nurse Practitioner

## 2023-05-06 ENCOUNTER — Ambulatory Visit
Admission: RE | Admit: 2023-05-06 | Discharge: 2023-05-06 | Disposition: A | Discharge status: Home / Self Care | Payer: Medicare Other | Source: Ambulatory Visit | Attending: Orthopedic Surgery | Admitting: Orthopedic Surgery

## 2023-05-06 DIAGNOSIS — M545 Low back pain, unspecified: Secondary | ICD-10-CM | POA: Insufficient documentation

## 2023-05-06 DIAGNOSIS — M4319 Spondylolisthesis, multiple sites in spine: Secondary | ICD-10-CM | POA: Diagnosis not present

## 2023-05-06 DIAGNOSIS — G8929 Other chronic pain: Secondary | ICD-10-CM | POA: Diagnosis not present

## 2023-05-06 DIAGNOSIS — M48061 Spinal stenosis, lumbar region without neurogenic claudication: Secondary | ICD-10-CM | POA: Diagnosis not present

## 2023-05-06 DIAGNOSIS — M47816 Spondylosis without myelopathy or radiculopathy, lumbar region: Secondary | ICD-10-CM | POA: Diagnosis not present

## 2023-05-06 DIAGNOSIS — M5125 Other intervertebral disc displacement, thoracolumbar region: Secondary | ICD-10-CM | POA: Diagnosis not present

## 2023-05-12 ENCOUNTER — Other Ambulatory Visit: Payer: Self-pay | Admitting: Family Medicine

## 2023-05-12 DIAGNOSIS — E039 Hypothyroidism, unspecified: Secondary | ICD-10-CM

## 2023-05-13 ENCOUNTER — Other Ambulatory Visit: Payer: Self-pay | Admitting: Family Medicine

## 2023-05-13 DIAGNOSIS — E039 Hypothyroidism, unspecified: Secondary | ICD-10-CM

## 2023-05-13 NOTE — Telephone Encounter (Signed)
Spoke to pt, r/s labs for tomorrow, 9/18

## 2023-05-13 NOTE — Telephone Encounter (Signed)
Pt no-showed her 6 week f/u lab appt to re-check her thyroid level. Please r/s lab appt (non fasting) asap and then route back to me to refill

## 2023-05-14 ENCOUNTER — Other Ambulatory Visit (INDEPENDENT_AMBULATORY_CARE_PROVIDER_SITE_OTHER): Payer: Medicare Other

## 2023-05-14 DIAGNOSIS — E039 Hypothyroidism, unspecified: Secondary | ICD-10-CM

## 2023-05-14 LAB — TSH: TSH: 0.01 u[IU]/mL — ABNORMAL LOW (ref 0.35–5.50)

## 2023-05-15 ENCOUNTER — Other Ambulatory Visit: Payer: Self-pay | Admitting: Family Medicine

## 2023-05-15 ENCOUNTER — Telehealth: Payer: Self-pay | Admitting: *Deleted

## 2023-05-15 MED ORDER — LEVOTHYROXINE SODIUM 88 MCG PO TABS: 88.0000 ug | ORAL_TABLET | Freq: Every day | ORAL | 1 refills | Status: DC

## 2023-05-15 NOTE — Telephone Encounter (Signed)
-----   Message from [place] sent at 05/14/2023  8:29 PM EDT ----- Tsh is unexpectedly very low  Last time we increased dose to 100 mcg daily  Need to now go down to 88 mcg again  Please send #30 1 refill if needed  Re check tsh in 4-6 weeks

## 2023-05-15 NOTE — Telephone Encounter (Signed)
Pt notified of lab results and Dr. Royden Purl comments. Rx sent to pharmacy and f/u lab appt scheduled

## 2023-05-18 DIAGNOSIS — Z23 Encounter for immunization: Secondary | ICD-10-CM | POA: Diagnosis not present

## 2023-05-21 ENCOUNTER — Ambulatory Visit (INDEPENDENT_AMBULATORY_CARE_PROVIDER_SITE_OTHER): Payer: Medicare Other | Admitting: Orthopedic Surgery

## 2023-05-21 DIAGNOSIS — M47816 Spondylosis without myelopathy or radiculopathy, lumbar region: Secondary | ICD-10-CM

## 2023-05-21 MED ORDER — CYCLOBENZAPRINE HCL 10 MG PO TABS: 10.0000 mg | ORAL_TABLET | Freq: Three times a day (TID) | ORAL | 0 refills | Status: DC | PRN

## 2023-05-21 NOTE — Progress Notes (Signed)
Orthopedic Spine Surgery Office Note   Assessment: Patient is a 89 y.o. female with persistent low back pain. No radicular symptoms. Has facet arthropathy at L4/5 and L5/S1. Also has spondylolisthesis      Plan: -Patient has tried tylenol, PT, ibuprofen  -Prescribed flexeril to try for additional pain relief -Felt PT was helpful in the past so new referral provided to her today -Told her that I do not have an explanation for her incontinence based off of the MRI. It still seems more like functional incontinence. She can follow up with her PCP on this matter since it does not seem neurologic in nature -Patient should return to office in 6 weeks, x-rays at next visit: none     Patient expressed understanding of the plan and all questions were answered to the patient's satisfaction.    ___________________________________________________________________________     History:   Patient is a 89 y.o. female who presents today for follow up on her lumbar spine. She is still having significant low back pain. She feels it in the lower lumbar region. She does not have any pain radiating into either lower extremity. It is worse with activity and improves with rest. There have been no interval changes in her symptoms   Treatments tried: PT, tylenol, ibuprofen     Physical Exam:   General: no acute distress, appears stated age Neurologic: alert, answering questions appropriately, following commands Respiratory: unlabored breathing on room air, symmetric chest rise Psychiatric: appropriate affect, normal cadence to speech     MSK (spine):   -Strength exam                                                   Left                  Right EHL                              5/5                  5/5 TA                                 5/5                  5/5 GSC                             5/5                  5/5 Knee extension            5/5                  5/5 Hip flexion                    5/5                   5/5   -Sensory exam                           Sensation intact to light touch in L3-S1 nerve distributions of bilateral  lower extremities   Imaging: XRs of the lumbar spine from 04/24/2023 were previously independently reviewed and interpreted, showing spondylolisthesis at L5/S1 that shifts about 1mm between flexion and extension views. Disc height loss at L4/5 and L5/S1. No fracture or dislocation seen.    MRI of the lumbar spine from 05/06/2023 were independently reviewed and interpreted, showing spondylolisthesis at L5/S1. No significant T2 signal within the facet joints at L5/S1 (~29mm in the left facet joint). Left L3/4 foraminal stenosis. No other significant stenosis seen. Facet arthropathy at L4/5 and L5/S1. Paraspinal fatty atrophy especially in the lower lumbar levels. Laminectomy defects at L4/5 and L5/S1.    Patient name: Linda Mcgrath Patient MRN: [id number] Date of visit: 05/21/23

## 2023-05-22 ENCOUNTER — Ambulatory Visit: Payer: Medicare Other | Admitting: Dermatology

## 2023-05-22 ENCOUNTER — Encounter: Payer: Self-pay | Admitting: Dermatology

## 2023-05-22 VITALS — BP 139/80 | HR 77

## 2023-05-22 DIAGNOSIS — L570 Actinic keratosis: Secondary | ICD-10-CM

## 2023-05-22 DIAGNOSIS — W908XXA Exposure to other nonionizing radiation, initial encounter: Secondary | ICD-10-CM | POA: Diagnosis not present

## 2023-05-22 DIAGNOSIS — C44629 Squamous cell carcinoma of skin of left upper limb, including shoulder: Secondary | ICD-10-CM

## 2023-05-22 DIAGNOSIS — D492 Neoplasm of unspecified behavior of bone, soft tissue, and skin: Secondary | ICD-10-CM

## 2023-05-22 NOTE — Progress Notes (Signed)
New Patient Visit   Subjective  Linda Mcgrath is a 89 y.o. female who presents for the following: lesions on hands. PCP suspects actinic keratoses.Left greater than right. Dur: 4-5 months. Painful. No personal Hx of skin cancer.   The patient has spots, moles and lesions to be evaluated, some may be new or changing and the patient may have concern these could be cancer.  The following portions of the chart were reviewed this encounter and updated as appropriate: medications, allergies, medical history  Review of Systems:  No other skin or systemic complaints except as noted in HPI or Assessment and Plan.  Objective  Well appearing patient in no apparent distress; mood and affect are within normal limits.  A focused examination was performed of the following areas: Hands   Relevant physical exam findings are noted in the Assessment and Plan.  Left Dorsal Hand Anterior Scaly erythematous plaque     Left Dorsal Hand Posterior Erythematous scaly plaque with keratotic horn     Right Hand - dorsum x2, Left hand dorsum x1 (3) Erythematous thin papules/macules with gritty scale.          Assessment & Plan   Neoplasm of skin (2) Left Dorsal Hand Anterior  Skin / nail biopsy Type of biopsy: tangential   Informed consent: discussed and consent obtained   Timeout: patient name, date of birth, surgical site, and procedure verified   Procedure prep:  Patient was prepped and draped in usual sterile fashion Prep type:  Isopropyl alcohol Anesthesia: the lesion was anesthetized in a standard fashion   Anesthetic:  1% lidocaine w/ epinephrine 1-100,000 buffered w/ 8.4% NaHCO3 Instrument used: DermaBlade   Hemostasis achieved with: pressure, aluminum chloride and electrodesiccation   Outcome: patient tolerated procedure well   Post-procedure details: sterile dressing applied and wound care instructions given   Dressing type: bandage and petrolatum    Left Dorsal  Hand Posterior  Skin / nail biopsy Type of biopsy: tangential   Informed consent: discussed and consent obtained   Timeout: patient name, date of birth, surgical site, and procedure verified   Procedure prep:  Patient was prepped and draped in usual sterile fashion Prep type:  Isopropyl alcohol Anesthesia: the lesion was anesthetized in a standard fashion   Anesthetic:  1% lidocaine w/ epinephrine 1-100,000 buffered w/ 8.4% NaHCO3 Instrument used: DermaBlade   Hemostasis achieved with: pressure, aluminum chloride and electrodesiccation   Outcome: patient tolerated procedure well   Post-procedure details: sterile dressing applied and wound care instructions given   Dressing type: bandage and petrolatum    AK (actinic keratosis) (3) Right Hand - dorsum x2, Left hand dorsum x1  Actinic keratoses are precancerous spots that appear secondary to cumulative UV radiation exposure/sun exposure over time. They are chronic with expected duration over 1 year. A portion of actinic keratoses will progress to squamous cell carcinoma of the skin. It is not possible to reliably predict which spots will progress to skin cancer and so treatment is recommended to prevent development of skin cancer.  Recommend daily broad spectrum sunscreen SPF 30+ to sun-exposed areas, reapply every 2 hours as needed.  Recommend staying in the shade or wearing long sleeves, sun glasses (UVA+UVB protection) and wide brim hats (4-inch brim around the entire circumference of the hat). Call for new or changing lesions.  Destruction of lesion - Right Hand - dorsum x2, Left hand dorsum x1 (3)  Destruction method: cryotherapy   Informed consent: discussed and consent obtained  Lesion destroyed using liquid nitrogen: Yes   Region frozen until ice ball extended beyond lesion: Yes   Outcome: patient tolerated procedure well with no complications   Post-procedure details: wound care instructions given   Additional details:  Prior  to procedure, discussed risks of blister formation, small wound, skin dyspigmentation, or rare scar following cryotherapy. Recommend Vaseline ointment to treated areas while healing.      Return for follow up pending pathology results. Jacquiline Doe, CMA, am acting as scribe for Gwenith Daily, MD.   Documentation: I have reviewed the above documentation for accuracy and completeness, and I agree with the above.  Gwenith Daily, MD

## 2023-05-22 NOTE — Patient Instructions (Addendum)

## 2023-05-23 LAB — SURGICAL PATHOLOGY

## 2023-05-27 ENCOUNTER — Telehealth: Payer: Self-pay

## 2023-05-27 NOTE — Telephone Encounter (Signed)
-----   Message from [facility], [facility], [facility]I sent at 05/27/2023  9:39 AM EDT ----- SCC- left dorsal hand- anterior- Mohs with me SCC- left dorsal hand- posterior- Mohs with me  [facility] for same day  Please call patient to discuss diagnosis and schedule for Mohs surgery.

## 2023-05-27 NOTE — Telephone Encounter (Signed)
Spoke with pt and gave her biopsy results and recommendations for mohs

## 2023-05-28 DIAGNOSIS — Z961 Presence of intraocular lens: Secondary | ICD-10-CM | POA: Diagnosis not present

## 2023-05-28 DIAGNOSIS — H401131 Primary open-angle glaucoma, bilateral, mild stage: Secondary | ICD-10-CM | POA: Diagnosis not present

## 2023-06-03 ENCOUNTER — Other Ambulatory Visit: Payer: Self-pay

## 2023-06-03 ENCOUNTER — Ambulatory Visit: Payer: Medicare Other | Admitting: Physical Medicine and Rehabilitation

## 2023-06-03 VITALS — BP 152/85 | HR 93

## 2023-06-03 DIAGNOSIS — M47816 Spondylosis without myelopathy or radiculopathy, lumbar region: Secondary | ICD-10-CM

## 2023-06-03 MED ORDER — METHYLPREDNISOLONE ACETATE 40 MG/ML IJ SUSP
40.0000 mg | Freq: Once | INTRAMUSCULAR | Status: AC
Start: 2023-06-03 — End: 2023-06-03
  Administered 2023-06-03: 40 mg

## 2023-06-03 NOTE — Progress Notes (Signed)
Functional Pain Scale - descriptive words and definitions  Moderate (4)   Constantly aware of pain, can complete ADLs with modification/sleep marginally affected at times/passive distraction is of no use, but active distraction gives some relief. Moderate range order  Average Pain  varies   +Driver, -BT, -Dye Allergies.  Lower back pain on both sides. Pain is worse when lying down

## 2023-06-03 NOTE — Patient Instructions (Signed)

## 2023-06-09 ENCOUNTER — Ambulatory Visit: Payer: Medicare Other | Admitting: Physical Therapy

## 2023-06-16 ENCOUNTER — Telehealth: Payer: Self-pay | Admitting: Family Medicine

## 2023-06-16 ENCOUNTER — Encounter: Payer: Self-pay | Admitting: Dermatology

## 2023-06-16 ENCOUNTER — Ambulatory Visit: Payer: Medicare Other | Admitting: Dermatology

## 2023-06-16 VITALS — BP 131/83 | HR 81 | Temp 98.3°F

## 2023-06-16 DIAGNOSIS — C44629 Squamous cell carcinoma of skin of left upper limb, including shoulder: Secondary | ICD-10-CM

## 2023-06-16 DIAGNOSIS — C4492 Squamous cell carcinoma of skin, unspecified: Secondary | ICD-10-CM

## 2023-06-16 DIAGNOSIS — E039 Hypothyroidism, unspecified: Secondary | ICD-10-CM

## 2023-06-16 MED ORDER — MUPIROCIN 2 % EX OINT: 1.0000 | TOPICAL_OINTMENT | Freq: Two times a day (BID) | CUTANEOUS | 0 refills | Status: DC

## 2023-06-16 MED ORDER — OXYCODONE HCL 5 MG PO TABS
5.0000 mg | ORAL_TABLET | Freq: Four times a day (QID) | ORAL | 0 refills | Status: DC | PRN
Start: 2023-06-16 — End: 2023-08-22

## 2023-06-16 NOTE — Progress Notes (Signed)
Linda Mcgrath - 89 y.o. female MRN [id number]  Date of birth: 1932/11/01  Office Visit Note: Visit Date: 06/03/2023 PCP: Judy Pimple, MD Referred by: London Sheer, MD  Subjective: Chief Complaint  Patient presents with   Lower Back - Pain   HPI:  Linda Mcgrath is a 89 y.o. female who comes in today at the request of Dr. Willia Craze for planned Bilateral  L4-5 and L5-S1 Lumbar facet/medial branch block with fluoroscopic guidance.  The patient has failed conservative care including home exercise, medications, time and activity modification.  This injection will be diagnostic and hopefully therapeutic.  Please see requesting physician notes for further details and justification.  Exam has shown concordant pain with facet joint loading.   ROS Otherwise per HPI.  Assessment & Plan: Visit Diagnoses:    ICD-10-CM   1. Spondylosis without myelopathy or radiculopathy, lumbar region  M47.816 XR C-ARM NO REPORT    Facet Injection    methylPREDNISolone acetate (DEPO-MEDROL) injection 40 mg      Plan: No additional findings.   Meds & Orders:  Meds ordered this encounter  Medications   methylPREDNISolone acetate (DEPO-MEDROL) injection 40 mg    Orders Placed This Encounter  Procedures   Facet Injection   XR C-ARM NO REPORT    Follow-up: Return for visit to requesting provider as needed.   Procedures: No procedures performed  Lumbar Facet Joint Intra-Articular Injection(s) with Fluoroscopic Guidance  Patient: Linda Mcgrath      Date of Birth: Sep 23, 1932 MRN: [id number] PCP: Judy Pimple, MD      Visit Date: 06/03/2023   Universal Protocol:    Date/Time: 06/03/2023  Consent Given By: the patient  Position: PRONE   Additional Comments: Vital signs were monitored before and after the procedure. Patient was prepped and draped in the usual sterile fashion. The correct patient, procedure, and site was verified.   Injection Procedure  Details:  Procedure Site One Meds Administered:  Meds ordered this encounter  Medications   methylPREDNISolone acetate (DEPO-MEDROL) injection 40 mg     Laterality: Bilateral  Location/Site:  L4-L5 L5-S1  Needle size: 22 guage  Needle type: Spinal  Needle Placement: Articular  Findings:  -Comments: Excellent flow of contrast producing a partial arthrogram.  Procedure Details: The fluoroscope beam is vertically oriented in AP, and the inferior recess is visualized beneath the lower pole of the inferior apophyseal process, which represents the target point for needle insertion. When direct visualization is difficult the target point is located at the medial projection of the vertebral pedicle. The region overlying each aforementioned target is locally anesthetized with a 1 to 2 ml. volume of 1% Lidocaine without Epinephrine.   The spinal needle was inserted into each of the above mentioned facet joints using biplanar fluoroscopic guidance. A 0.25 to 0.5 ml. volume of Isovue-250 was injected and a partial facet joint arthrogram was obtained. A single spot film was obtained of the resulting arthrogram.    One to 1.25 ml of the steroid/anesthetic solution was then injected into each of the facet joints noted above.   Additional Comments:  No complications occurred Dressing: 2 x 2 sterile gauze and Band-Aid    Post-procedure details: Patient was observed during the procedure. Post-procedure instructions were reviewed.  Patient left the clinic in stable condition.    Clinical History: EXAM: MRI LUMBAR SPINE WITHOUT CONTRAST  FINDINGS: Segmentation: Transitional lumbosacral anatomy. Spinal numbering will remain consistent with that utilized on the prior  lumbar spine MRI of 07/02/2017. By this numbering scheme, a rudimentary rib is present on the right at L1. The S1 vertebra is transitional and partially lumbarized. A relatively well-formed intervertebral disc space is  present at the S1-S2 level.   Alignment: Levocurvature of the lumbar spine. Slight T12-L1 grade 1 retrolisthesis. 3 mm L1-L2 grade 1 retrolisthesis. 4 mm L2-L3 grade 1 retrolisthesis. 3 mm L4-L5 grade 1 anterolisthesis. 9 mm L5-S1 grade 1/II anterolisthesis.   Vertebrae: No lumbar vertebral compression fracture. Mild degenerative endplate edema at L24-M0, L1-L2, L2-L3, L3-L4 and L5-S1.   Conus medullaris and cauda equina: Conus extends to the L1-L2 level. No signal abnormality identified within the visualized distal spinal cord.   Paraspinal and other soft tissues: No acute finding within included portions of the abdomen/retroperitoneum. Sigmoid diverticulosis. Small fluid collections within the dorsal paraspinal soft tissues bilaterally at L5-S1.   T12-L1: This level is imaged in the sagittal plane only. Grade 1 retrolisthesis. Disc bulge. Endplate osteophytes greatest along the anterior and right lateral aspects of the disc space, progressed. Facet arthrosis and spurring on the right. No significant spinal canal stenosis or foraminal stenosis.   L1-L2: Grade 1 retrolisthesis. Disc bulge. Facet and ligamentum flavum hypertrophy, progressed. No significant spinal canal or foraminal stenosis.   L2-L3: Grade 1 retrolisthesis. Disc bulge. Facet and ligamentum flavum hypertrophy, progressed. No significant spinal canal stenosis. Mild relative left neural foraminal narrowing, new from the prior exam. New from the prior MRI.   L3-L4: Disc bulge with endplate osteophytes. Hypertrophic facet arthrosis and ligamentum flavum hypertrophy, progressed. Mild left subarticular narrowing. No significant central canal stenosis. Mild left neural foraminal narrowing, progressed.   L4-L5: Interval posterior decompression. Grade 1 anterolisthesis. Disc bulge with endplate osteophytes. Posterior element hypertrophy. Only mild relative spinal canal narrowing remains. Mild left neural foraminal  narrowing, unchanged.   L5-S1: Interval posterior decompression. 9 mm grade I/II anterolisthesis. Right center posterior annular fissure. Left foraminal zone posterior annular fissure, new from the prior MRI. Disc uncovering with disc bulge and endplate spurring. Posterior element hypertrophy. No significant central canal or right subarticular stenosis remains. Minimal residual left subarticular narrowing, significantly improved. Mild bilateral neural foraminal narrowing.   S1-S2. Transitional level. Mild facet arthrosis. No significant disc herniation or stenosis.   IMPRESSION: 1. Transitional lumbosacral anatomy as detailed. Spinal numbering remains consistent with that utilized on the prior lumbar spine MRI of 07/02/2017. 2. Spondylosis and postoperative sequelae, as detailed. 3. At L4-L5, there has been interval posterior decompression. Only mild relative spinal canal narrowing remains. Mild left neural foraminal narrowing, unchanged. 4. At L5-S1, there has been interval posterior decompression. No significant residual central canal or right subarticular stenosis. Only minimal residual left subarticular narrowing persists. Mild bilateral neural foraminal narrowing, unchanged. 5. No significant central canal stenosis at the remaining levels. 6. Multifactorial mild relative left subarticular narrowing at L3-L4, unchanged. 7. No more than mild neural foraminal narrowing at the remaining levels. 8. Multilevel spondylolisthesis. Most notably, there is unchanged 9 mm grade I/II anterolisthesis at L5-S1. 9. Mild multilevel degenerative endplate edema. 10. Small fluid collections within the dorsal paraspinal soft tissues bilaterally at L5-S1.  On: 05/21/2023 15:24     Objective:  VS:  HT:    WT:   BMI:     BP:(!) 152/85  HR:93bpm  TEMP: ( )  RESP:  Physical Exam Vitals and nursing note reviewed.  Constitutional:      General: She is not in acute distress.     Appearance: Normal appearance.  She is not ill-appearing.  HENT:     Head: Normocephalic and atraumatic.     Right Ear: External ear normal.     Left Ear: External ear normal.  Eyes:     Extraocular Movements: Extraocular movements intact.  Cardiovascular:     Rate and Rhythm: Normal rate.     Pulses: Normal pulses.  Pulmonary:     Effort: Pulmonary effort is normal. No respiratory distress.  Abdominal:     General: There is no distension.     Palpations: Abdomen is soft.  Musculoskeletal:        General: Tenderness present.     Cervical back: Neck supple.     Right lower leg: No edema.     Left lower leg: No edema.     Comments: Patient has good distal strength with no pain over the greater trochanters.  No clonus or focal weakness.  Skin:    Findings: No erythema, lesion or rash.  Neurological:     General: No focal deficit present.     Mental Status: She is alert and oriented to person, place, and time.     Sensory: No sensory deficit.     Motor: No weakness or abnormal muscle tone.     Coordination: Coordination normal.  Psychiatric:        Mood and Affect: Mood normal.        Behavior: Behavior normal.      Imaging: No results found.

## 2023-06-16 NOTE — Progress Notes (Signed)
Follow-Up Visit   Subjective  Linda Mcgrath is a 89 y.o. female who presents for the following: Mohs for Well Differentiated SCC of the left hand x 2 (posterior/anterior). 2 sites treated today  She is accompanied by a friend.   The following portions of the chart were reviewed this encounter and updated as appropriate: medications, allergies, medical history  Review of Systems:  No other skin or systemic complaints except as noted in HPI or Assessment and Plan.  Objective  Well appearing patient in no apparent distress; mood and affect are within normal limits.  A focused examination was performed of the following areas: Left dorsal hand Relevant physical exam findings are noted in the Assessment and Plan.   Left dorsal Hand - Posterior          Assessment & Plan   Squamous cell carcinoma of skin (2) Left dorsal Hand - anterior  Mohs surgery  Consent obtained: written  Anticoagulation: Is the patient taking prescription anticoagulant and/or aspirin prescribed/recommended by a physician? No   Was the anticoagulation regimen changed prior to Mohs? No    Procedure Details: Surgical site (from skin exam): Left dorsal Hand - anterior  Left dorsal Hand - Posterior  Mohs surgery  Consent obtained: written  Anticoagulation: Is the patient taking prescription anticoagulant and/or aspirin prescribed/recommended by a physician? No   Was the anticoagulation regimen changed prior to Mohs? No    Procedure Details: Surgical site (from skin exam): Left dorsal Hand - Posterior  Related Medications oxyCODONE (OXY IR/ROXICODONE) 5 MG immediate release tablet Take 1 tablet (5 mg total) by mouth every 6 (six) hours as needed for up to 8 doses for severe pain (pain score 7-10).    Return in about 2 weeks (around 06/30/2023) for wound check.  Owens Shark, CMA, am acting as scribe for Gwenith Daily, MD.    06/16/2023  HISTORY OF PRESENT  ILLNESS  Linda Mcgrath is seen in consultation at the request of Dr. Caralyn Guile for biopsy-proven Well Differentiated SCC x 2 on left dorsal hand (posterior/anterior). They note that the area has been present for about 1 year increasing in size with and getting painful.  There is no history of previous treatment.  Reports no other new or changing lesions and has no other complaints today.  Medications and allergies: see patient chart.  Review of systems: Reviewed 8 systems and notable for the above skin cancer.  All other systems reviewed are unremarkable/negative, unless noted in the HPI. Past medical history, surgical history, family history, social history were also reviewed and are noted in the chart/questionnaire.    PHYSICAL EXAMINATION  General: Well-appearing, in no acute distress, alert and oriented x 4. Vitals reviewed in chart (if available).   Skin: Exam reveals a 2.5 x 2.0 cm erythematous papule on the left anterior/distal dorsal hand and a 2.5 x 1.8 cm erythematous papule on the left posterior/proximal dorsal hand and biopsy scar on the left dorsal hand. There are rhytids, telangiectasias, and lentigines, consistent with photodamage.  Biopsy report(s) reviewed, confirming the diagnosis.   ASSESSMENT  1) Well Differentiated Squamous Cell Carcinoma of the left dorsal hand x2 (posterior/proximal and anterior/distal) 2) photodamage 3) solar lentigines   PLAN   1. Due to location, size, histology, or recurrence and the likelihood of subclinical extension as well as the need to conserve normal surrounding tissue, the patient was deemed acceptable for Mohs micrographic surgery (MMS).  The nature and purpose of the procedure, associated  benefits and risks including recurrence and scarring, possible complications such as pain, infection, and bleeding, and alternative methods of treatment if appropriate were discussed with the patient during consent. The lesion location was verified  by the patient, by reviewing previous notes, pathology reports, and by photographs as well as angulation measurements if available.  Informed consent was reviewed and signed by the patient, and timeout was performed at 9:10 AM. See op note below.  2. For the photodamage and solar lentigines, sun protection discussed/information given on OTC sunscreens, and we recommend continued regular follow-up with primary dermatologist every 6 months or sooner for any growing, bleeding, or changing lesions. 3. Prognosis and future surveillance discussed. 4. Letter with treatment outcome sent to referring provider. 5. Pain acetaminophen/ibuprofen/oxycodone 5 mg   MOHS MICROGRAPHIC SURGERY AND RECONSTRUCTION  Site 1- left dorsal hand anterior/distal Initial size:   2.5 x 2.20 cm Surgical defect/wound size: Merged with Site 2 for a defect size of 4.8 x 5.0 cm Anesthesia:    0.33% lidocaine with 1:200,000 epinephrine EBL:    <5 mL Complications:  None Repair type:   Tripolar advancement flap with FTSG SQ suture:   4-0 Vicryl Cutaneous suture:  5-0 fast absorbing gut Final size of the repair: 5.0 x 5.1 cm  Stages:  2  STAGE I: Anesthesia achieved with 0.5% lidocaine with 1:200,000 epinephrine. ChloraPrep applied. 1 section(s) excised using Mohs technique (this includes total peripheral and deep tissue margin excision and evaluation with frozen sections, excised and interpreted by the same physician). The tumor was first debulked and then excised with an approx. 2 mm margin.  Hemostasis was achieved with electrocautery as needed.  The specimen was then oriented, subdivided/relaxed, inked, and processed using Mohs technique.    Frozen section analysis revealed a positive margin for atypical epithelial cells with squamous differentiation in the dermis in the deep and peripheral margin.    STAGE II: An additional 2 mm margin was excised.  Hemostasis was achieved with electrocautery as needed.  The specimen was  then oriented, subdivided/relaxed, inked, and processed using Mohs technique. Evaluation of slides by the Mohs surgeon revealed clear tumor margins.  Site 2-  Initial size:   2.5 x 1.8 cm Surgical defect/wound size: Merged with Site 1 for a defect size of 4.8 x 5.0 cm Anesthesia:    0.33% lidocaine with 1:200,000 epinephrine EBL:    <5 mL Complications:  None Repair type:   Tripolar advancement flap with FTSG SQ suture:   4-0 Vicryl Cutaneous suture:  5-0 Fast absorbing gut Final size of the repair: 5.0 x 5.1 cm  Stages:  2  STAGE I: Anesthesia achieved with 0.5% lidocaine with 1:200,000 epinephrine. ChloraPrep applied. 1 section(s) excised using Mohs technique (this includes total peripheral and deep tissue margin excision and evaluation with frozen sections, excised and interpreted by the same physician). The tumor was first debulked and then excised with an approx. 2 mm margin.  Hemostasis was achieved with electrocautery as needed.  The specimen was then oriented, subdivided/relaxed, inked, and processed using Mohs technique.    Frozen section analysis revealed a positive margin for atypical epithelial cells with squamous differentiation in the dermis  in the deep and peripheral  margin.    STAGE II: An additional 2 mm margin was excised.  Hemostasis was achieved with electrocautery as needed.  The specimen was then oriented, subdivided/relaxed, inked, and processed using Mohs technique. Evaluation of slides by the Mohs surgeon revealed clear tumor margins.  Reconstruction - Sites  1 and 2 merged into one final defect  Operation:  Full thickness skin graft repair of the above MMS defect with Tripolar Advancement Flap Indication:  Functional and aesthetic reconstruction of the above wound Donor site:  Right abdomen Donor site suture: 3-0 PDS and Dermabond     FTSG suture:  5-0 Fast Absorbing Gut Graft length and width: 5.0  x 5.1 cm Graft surface area: 25 cm2 Flap surface area:  6.5  x 4.0 cm = 26 cm^2 Postoperative medications: None Complications:  None  Due to the size, depth, and location, apposition could not be obtained without significant tension and distortion of adjacent tissue and structures. We discussed repair options including graft, flap, and second intention. We chose to proceed with a Full Thickness Skin Graft and Tripolar Advancement Flap. Relaxing incisions were made, if necessary, for lateral tension release.  The tissue was advanced and closed centrally, and redundant tissue was trimmed as necessary.  The wound was sutured in a layered fashion to close potential dead space and to precisely and securely approximate the wound edges. The central portion did not close all the way.  The dimensions of the flap were:  6.5 cm x 4.0 cm for a total flap surface area of 26 cm^2 centimeters squared (cm2).  Therefore, a template/measurement of the defect was made to pattern the excision in the donor site that was then aseptically prepped, anesthetized, and excised.  The donor defect was closed without significant tension.  Hemostasis was achieved with electrocautery.  The graft was then defatted, trimmed, and placed on the recipient site.  The circumference of the graft was secured with the above suture.  Internal bolstering stitches were placed as needed.  The surgical site was then lightly scrubbed with sterile, saline-soaked gauze.  The area was then bandaged using Vaseline ointment, non-adherent gauze, gauze pads, and tape to provide an adequate pressure dressing.   The patient tolerated the procedure well, was given detailed written and verbal wound care instructions, and was discharged in good condition.  Sun protection and sun avoidance was also discussed. The patient will follow-up in 10-14 days.   Documentation: I have reviewed the above documentation for accuracy and completeness, and I agree with the above.  Gwenith Daily, MD

## 2023-06-16 NOTE — Patient Instructions (Signed)

## 2023-06-16 NOTE — Procedures (Signed)
Lumbar Facet Joint Intra-Articular Injection(s) with Fluoroscopic Guidance  Patient: Linda Mcgrath      Date of Birth: 07-01-1933 MRN: [id number] PCP: Judy Pimple, MD      Visit Date: 06/03/2023   Universal Protocol:    Date/Time: 06/03/2023  Consent Given By: the patient  Position: PRONE   Additional Comments: Vital signs were monitored before and after the procedure. Patient was prepped and draped in the usual sterile fashion. The correct patient, procedure, and site was verified.   Injection Procedure Details:  Procedure Site One Meds Administered:  Meds ordered this encounter  Medications   methylPREDNISolone acetate (DEPO-MEDROL) injection 40 mg     Laterality: Bilateral  Location/Site:  L4-L5 L5-S1  Needle size: 22 guage  Needle type: Spinal  Needle Placement: Articular  Findings:  -Comments: Excellent flow of contrast producing a partial arthrogram.  Procedure Details: The fluoroscope beam is vertically oriented in AP, and the inferior recess is visualized beneath the lower pole of the inferior apophyseal process, which represents the target point for needle insertion. When direct visualization is difficult the target point is located at the medial projection of the vertebral pedicle. The region overlying each aforementioned target is locally anesthetized with a 1 to 2 ml. volume of 1% Lidocaine without Epinephrine.   The spinal needle was inserted into each of the above mentioned facet joints using biplanar fluoroscopic guidance. A 0.25 to 0.5 ml. volume of Isovue-250 was injected and a partial facet joint arthrogram was obtained. A single spot film was obtained of the resulting arthrogram.    One to 1.25 ml of the steroid/anesthetic solution was then injected into each of the facet joints noted above.   Additional Comments:  No complications occurred Dressing: 2 x 2 sterile gauze and Band-Aid    Post-procedure details: Patient was observed  during the procedure. Post-procedure instructions were reviewed.  Patient left the clinic in stable condition.

## 2023-06-16 NOTE — Telephone Encounter (Signed)
-----   Message from Alvina Chou sent at 06/02/2023  3:31 PM EDT ----- Regarding: Lab orders for [facility], 10.23.24 Lab orders, thanks

## 2023-06-17 ENCOUNTER — Encounter: Payer: Self-pay | Admitting: Dermatology

## 2023-06-18 ENCOUNTER — Other Ambulatory Visit (INDEPENDENT_AMBULATORY_CARE_PROVIDER_SITE_OTHER): Payer: Medicare Other

## 2023-06-18 ENCOUNTER — Telehealth: Payer: Self-pay | Admitting: Family Medicine

## 2023-06-18 DIAGNOSIS — E039 Hypothyroidism, unspecified: Secondary | ICD-10-CM

## 2023-06-18 LAB — TSH: TSH: <0.01 u[IU]/mL — ABNORMAL LOW (ref 0.35–5.50)

## 2023-06-18 NOTE — Telephone Encounter (Signed)
Patient came in today for a lab appt, during the appt. She stated she had hand surgery on Monday, 10.21.24. The hand was swollen and red at the top part that I could see and had a bloody wrap on it. The smell was very bad. She was advised to call her hand surgeon asap per Dr Milinda Antis, She has a f/u appt in two weeks with them.

## 2023-06-30 ENCOUNTER — Ambulatory Visit: Payer: Medicare Other | Attending: Orthopedic Surgery | Admitting: Physical Therapy

## 2023-07-01 ENCOUNTER — Encounter: Payer: Self-pay | Admitting: Dermatology

## 2023-07-01 ENCOUNTER — Ambulatory Visit (INDEPENDENT_AMBULATORY_CARE_PROVIDER_SITE_OTHER): Payer: Medicare Other | Admitting: Dermatology

## 2023-07-01 DIAGNOSIS — C4492 Squamous cell carcinoma of skin, unspecified: Secondary | ICD-10-CM

## 2023-07-01 DIAGNOSIS — Z85828 Personal history of other malignant neoplasm of skin: Secondary | ICD-10-CM

## 2023-07-01 DIAGNOSIS — Z48817 Encounter for surgical aftercare following surgery on the skin and subcutaneous tissue: Secondary | ICD-10-CM

## 2023-07-01 MED ORDER — MUPIROCIN 2 % EX OINT
1.0000 | TOPICAL_OINTMENT | Freq: Two times a day (BID) | CUTANEOUS | 0 refills | Status: DC
Start: 2023-07-01 — End: 2023-07-15

## 2023-07-01 NOTE — Progress Notes (Signed)
   Follow-Up Visit   Subjective  Linda Mcgrath is a 89 y.o. female who presents for the following: wound check s/p Mohs for SCCs on the left dorsal hand, repaired with a graft  Pt here today for follow up from Mohs on the left hand of 2 SCCs. She is accompanied by her friend who has been helping with her bandages.  The following portions of the chart were reviewed this encounter and updated as appropriate: medications, allergies, medical history  Review of Systems:  No other skin or systemic complaints except as noted in HPI or Assessment and Plan.  Objective  Well appearing patient in no apparent distress; mood and affect are within normal limits.  A focused examination was performed of the following areas: Left hand  Relevant exam findings are noted in the Assessment and Plan.   Assessment & Plan   Wound Check s/p Mohs for SCC x 2 repaired with FTSG Exam: well healing spots on left hand from Mohs surgery  - Reassured appearance of graft is looking good - Recommend continue copious amounts of mupirocin and vaseline to wound to keep it covered and moist - Donor site appears well healed on abdomen - Wound re-bandaged - Refilled mupirocin ointment  Return in about 2 weeks (around 07/15/2023).  I, Tillie Fantasia, CMA, am acting as scribe for Gwenith Daily, MD.   Documentation: I have reviewed the above documentation for accuracy and completeness, and I agree with the above.  Gwenith Daily, MD

## 2023-07-02 ENCOUNTER — Ambulatory Visit: Payer: Medicare Other | Admitting: Orthopedic Surgery

## 2023-07-07 ENCOUNTER — Ambulatory Visit (INDEPENDENT_AMBULATORY_CARE_PROVIDER_SITE_OTHER): Payer: Medicare Other | Admitting: Family Medicine

## 2023-07-07 ENCOUNTER — Encounter: Payer: Self-pay | Admitting: Family Medicine

## 2023-07-07 VITALS — BP 112/74 | HR 94 | Temp 97.8°F | Ht 62.0 in | Wt 150.5 lb

## 2023-07-07 DIAGNOSIS — E039 Hypothyroidism, unspecified: Secondary | ICD-10-CM | POA: Diagnosis not present

## 2023-07-07 DIAGNOSIS — K529 Noninfective gastroenteritis and colitis, unspecified: Secondary | ICD-10-CM | POA: Diagnosis not present

## 2023-07-07 DIAGNOSIS — R195 Other fecal abnormalities: Secondary | ICD-10-CM

## 2023-07-07 DIAGNOSIS — C44629 Squamous cell carcinoma of skin of left upper limb, including shoulder: Secondary | ICD-10-CM

## 2023-07-07 LAB — TSH: TSH: 2.12 u[IU]/mL (ref 0.35–5.50)

## 2023-07-07 LAB — CBC WITH DIFFERENTIAL/PLATELET
Basophils Absolute: 0.1 10*3/uL (ref 0.0–0.1)
Basophils Relative: 1.1 % (ref 0.0–3.0)
Eosinophils Absolute: 0.5 10*3/uL (ref 0.0–0.7)
Eosinophils Relative: 6.5 % — ABNORMAL HIGH (ref 0.0–5.0)
HCT: 45 % (ref 36.0–46.0)
Hemoglobin: 14.9 g/dL (ref 12.0–15.0)
Lymphocytes Relative: 46.5 % — ABNORMAL HIGH (ref 12.0–46.0)
Lymphs Abs: 3.4 10*3/uL (ref 0.7–4.0)
MCHC: 33.1 g/dL (ref 30.0–36.0)
MCV: 94.1 fL (ref 78.0–100.0)
Monocytes Absolute: 0.4 10*3/uL (ref 0.1–1.0)
Monocytes Relative: 6.1 % (ref 3.0–12.0)
Neutro Abs: 2.9 10*3/uL (ref 1.4–7.7)
Neutrophils Relative %: 39.8 % — ABNORMAL LOW (ref 43.0–77.0)
Platelets: 398 10*3/uL (ref 150.0–400.0)
RBC: 4.78 Mil/uL (ref 3.87–5.11)
RDW: 12.9 % (ref 11.5–15.5)
WBC: 7.2 10*3/uL (ref 4.0–10.5)

## 2023-07-07 LAB — T4, FREE: Free T4: 0.53 ng/dL — ABNORMAL LOW (ref 0.60–1.60)

## 2023-07-07 NOTE — Assessment & Plan Note (Signed)
Pt has history of intermittent diarrhea as her baseline  New change in thyroid (dec TSH) and recent surgery for left hand skin cancer noted  More loose stool recently  Per pt stools are darker than usual  Instructed her to check labels and make sure she has no iron supplement she was not aware of  Declines abd pain (other than cramping to have bm)   Given heme cards today  Cbc also added to labs

## 2023-07-07 NOTE — Assessment & Plan Note (Addendum)
Squamous cell carcinoma Reviewed last dermatology note Had Moh's and also skin graft Family member doing dressing changes  Using mupirocin  Pt has had significant discomfort/ struggling to get over this  Continues follow up with dermatologh

## 2023-07-07 NOTE — Progress Notes (Signed)
Subjective:    Patient ID: Linda Mcgrath, female    DOB: 10-Aug-1933, 89 y.o.   MRN: [id number]  HPI  Wt Readings from Last 3 Encounters:  07/07/23 150 lb 8 oz (68.3 kg)  04/23/23 155 lb 4 oz (70.4 kg)  03/10/23 158 lb 2 oz (71.7 kg)   27.53 kg/m  Vitals:   07/07/23 1114  BP: 112/74  Pulse: 94  Temp: 97.8 F (36.6 C)  SpO2: 97%    Pt presents for follow up of hypothyroidism  Lab Results  Component Value Date   TSH <0.01 (L) 06/18/2023    Recent TSH has been getting lower and lower  We instructed pt to hold her levothyroxine (was on 88 mcg) since last lab   Not feeling too much different  Little more diarrhea (prone to this)  Weight is down 8 lb from July   Appetite is on /off -not as good due to her hand pain   No more hair loss than usual    No past goiter or treatment  No past hyperthyroidism that she knows of    Had surgery on right hand (skin cancer) -squamous cell  It was a bigger surgery than planned  It got infected - topical antibiotic  Really uncomfortable still   More diarrhea recently -may be from the stress  Stool tends to be dark in color  Unsure if any medicines have iron in them  Some cramping before bms but no other pain     Has not had cholestyramine (prn in past from GI)   Lab Results  Component Value Date   WBC 6.6 04/23/2023   HGB 14.1 04/23/2023   HCT 43.8 04/23/2023   MCV 97.2 04/23/2023   PLT 283.0 04/23/2023         Patient Active Problem List   Diagnosis Date Noted   Dark stools 07/07/2023   Chronic diarrhea 04/23/2023   Squamous cell cancer of skin of left hand 04/23/2023   Abnormal urinalysis 03/10/2023   Actinic keratosis 03/10/2023   TMJ arthralgia 01/13/2023   Right shoulder pain 10/23/2022   Urinary frequency 03/26/2022   Chronic upper back pain 08/30/2020   Aortic atherosclerosis (HCC) 05/31/2020   Abnormal CT scan, small bowel 05/18/2020   Insomnia 12/15/2019   Chronic low back pain  10/18/2019   Osteoarthritis of right knee 08/02/2019   S/P lumbar spinal fusion 07/31/2017   H/O atrial flutter 03/06/2016   Obesity 11/06/2015   Urge incontinence 01/24/2015   Estrogen deficiency 10/25/2014   Lichen planus 12/20/2013   Prediabetes 06/03/2011   HYPERTENSION, BENIGN ESSENTIAL 10/23/2010   Osteoarthrosis, hand 01/24/2010   Hypothyroidism 08/24/2008   Vitamin D deficiency 06/06/2008   Hyperlipidemia 06/06/2008   MENOPAUSAL SYNDROME 03/29/2008   Osteoporosis 03/29/2008   Allergic rhinitis 01/25/2008   IBS 01/25/2008   Osteoarthritis 01/25/2008   FIBROMYALGIA 01/25/2008   Past Medical History:  Diagnosis Date   Allergic rhinitis    Arthritis    Asthma    Cataract    Bil/lens implant   Colon polyp 1999   small polyp   Diverticulosis    Dysrhythmia    Fibromyalgia    Hyperlipidemia    Hypothyroidism    Internal hemorrhoids    Leg cramps    Lichen planus    Lung nodule    stable LUL 9 mm   Menopausal syndrome    Osteoarthritis    UTI (urinary tract infection)    Past Surgical History:  Procedure  Laterality Date   Abd U/S  11/1998   negative   Abd U/S  01/2001   gallbladder polyps   ABDOMINAL HYSTERECTOMY  1975   total-fibroids   APPENDECTOMY  1975   BREAST BIOPSY     benign   BREAST EXCISIONAL BIOPSY Left 1957   CARDIOVERSION N/A 03/07/2016   Procedure: CARDIOVERSION;  Surgeon: Yates Decamp, MD;  Location: [facility] ENDOSCOPY;  Service: Cardiovascular;  Laterality: N/A;   CATARACT EXTRACTION     bilateral   CHOLECYSTECTOMY     COLONOSCOPY  1998   Diverticulosis; polyp\   COLONOSCOPY  09/2002   Diverticulosis, hem   COLONOSCOPY  12/2007   diverticulosis, polyp   DEXA  10/2001   osteopenia   ELECTROPHYSIOLOGIC STUDY N/A 04/03/2016   Procedure: A-Flutter Ablation;  Surgeon: Will [name], MD;  Location: [facility] INVASIVE CV LAB;  Service: Cardiovascular;  Laterality: N/A;   ESOPHAGOGASTRODUODENOSCOPY  2004   [place] scan  01/2001   Negative   KNEE  SURGERY     right knee / 11/2004   LAMINECTOMY WITH POSTERIOR LATERAL ARTHRODESIS LEVEL 2 N/A 07/31/2017   Procedure: DECOMPRESSIVE LAMINECTOMY LUMABR FOUR-FIVE, LUMBAR FIVE-SACRAL ONE;  Surgeon: Tia Alert, MD;  Location: [facility] OR;  Service: Neurosurgery;  Laterality: N/A;   laser surgery for glaucoma Left 09/21/2014   MULTIPLE TOOTH EXTRACTIONS     rentinal tear     ROTATOR CUFF REPAIR     x 2 /right shoulder   SKIN CANCER EXCISION     pre-melanoma / on face   TEE WITHOUT CARDIOVERSION N/A 03/07/2016   Procedure: TRANSESOPHAGEAL ECHOCARDIOGRAM (TEE);  Surgeon: Yates Decamp, MD;  Location[facility];  Service: Cardiovascular;  Laterality: N/A;   TOE SURGERY     rt foot    Social History   Tobacco Use   Smoking status: Never   Smokeless tobacco: Never  Vaping Use   Vaping status: Never Used  Substance Use Topics   Alcohol use: No    Alcohol/week: 0.0 standard drinks of alcohol    Comment: occasional-rare   Drug use: No   Family History  Problem Relation Age of Onset   Parkinsonism Father    Colon cancer Brother    Allergies Mother    Heart disease Mother    Kidney failure Son        congenital  (kidney transplant)    Heart failure Son        died suddenly of CHF    Allergies  Allergen Reactions   Shellfish Allergy Anaphylaxis and Nausea And Vomiting   Tetracycline Hives, Nausea And Vomiting, Rash and Other (See Comments)    SYSTEMIC REACTION: heart palpitations,muscle spasms, vomiting   Amlodipine Besylate Other (See Comments)    REACTION: muscle spasms, extremity swelling, low bp   Ciprofloxacin Other (See Comments)    REACTION: muscle spasms, insomnia   Codeine Other (See Comments)    REACTION: hallucinations   Propoxyphene Hcl Other (See Comments)    Unknown   Sulfamethoxazole-Trimethoprim Nausea Only and Other (See Comments)    REACTION: dizzy, could not focus eyes and could not concentrate and nausea   Tape Other (See Comments)    BANDAIDS TAKE OFF HER  SKIN; PLEASE USE COBAN OR PAPER    Xarelto [Rivaroxaban] Other (See Comments)    Aches and pains and nervousness   Amoxicillin Rash   Other Swelling, Rash and Other (See Comments)    Has autoimmune disorder and spices erode her salivary glands and affects ankles  and mouth DIRECTLY (takes off 1st layer of skin and leaves her unable to walk)   Penicillins Swelling, Rash and Other (See Comments)    Has patient had a PCN reaction causing immediate rash, facial/tongue/throat swelling, SOB or lightheadedness with hypotension: Yes Has patient had a PCN reaction causing severe rash involving mucus membranes or skin necrosis: No Has patient had a PCN reaction that required hospitalization No Has patient had a PCN reaction occurring within the last 10 years: No If all of the above answers are "NO", then may proceed with Cephalosporin use.    Current Outpatient Medications on File Prior to Visit  Medication Sig Dispense Refill   alendronate (FOSAMAX) 70 MG tablet TAKE 1 TABLET(70 MG) BY MOUTH EVERY 7 DAYS WITH A FULL GLASS OF WATER AND ON AN EMPTY STOMACH 12 tablet 1   Ascorbic Acid (VITAMIN C PO) Take 500 mg by mouth daily.     B Complex Vitamins (VITAMIN B COMPLEX PO) Take 1 tablet by mouth daily.     B COMPLEX, FOLIC ACID, PO Take 666 mcg by mouth daily.     Biotin 10 MG TABS Take 1 tablet by mouth every morning.      Calcium Citrate-Vitamin D 250-200 MG-UNIT TABS Take 1 tablet by mouth daily.     cholestyramine (QUESTRAN) 4 g packet MIX AND DRINK 1 PACKET DAILY 90 each 1   Cranberry 180 MG CAPS Take 2 capsules by mouth daily.     cyclobenzaprine (FLEXERIL) 10 MG tablet Take 1 tablet (10 mg total) by mouth 3 (three) times daily as needed (pain, muscle spasms). 50 tablet 0   dextromethorphan-guaiFENesin (MUCINEX DM) 30-600 MG 12hr tablet Take 1 tablet by mouth 2 (two) times daily. 15 tablet 0   Fexofenadine HCl (MUCINEX ALLERGY PO) Take 1 tablet by mouth daily as needed (Allergies).       fluticasone (FLONASE) 50 MCG/ACT nasal spray Place 1 spray into both nostrils daily for 3 days. 16 g 0   folic acid (FOLVITE) 1 MG tablet Take 1 mg by mouth daily.     loratadine (CLARITIN) 10 MG tablet Take 10 mg by mouth daily as needed for allergies.      MAGNESIUM-OXIDE PO Take 1 tablet by mouth daily.     Multiple Vitamin (MULTIVITAMIN) capsule Take 1 capsule by mouth daily.     mupirocin ointment (BACTROBAN) 2 % Apply 1 Application topically 2 (two) times daily. 30 g 0   oxyCODONE (OXY IR/ROXICODONE) 5 MG immediate release tablet Take 1 tablet (5 mg total) by mouth every 6 (six) hours as needed for up to 8 doses for severe pain (pain score 7-10). 8 tablet 0   Psyllium (METAMUCIL FIBER PO) Take by mouth at bedtime. 2 tablespoon     solifenacin (VESICARE) 10 MG tablet Take 1 tablet (10 mg total) by mouth daily. 90 tablet 1   TURMERIC PO Take 1 capsule by mouth 2 (two) times daily.      vitamin B-12 (CYANOCOBALAMIN) 500 MCG tablet Take 500 mcg by mouth daily.     Vitamin D, Cholecalciferol, 50 MCG (2000 UT) CAPS Take 2 tablets by mouth daily.     No current facility-administered medications on file prior to visit.    Review of Systems  Constitutional:  Positive for fatigue. Negative for activity change, appetite change, fever and unexpected weight change.  HENT:  Negative for congestion, ear pain, rhinorrhea, sinus pressure and sore throat.   Eyes:  Negative for pain, redness and  visual disturbance.  Respiratory:  Negative for cough, shortness of breath and wheezing.   Cardiovascular:  Negative for chest pain and palpitations.  Gastrointestinal:  Positive for diarrhea. Negative for abdominal pain, anal bleeding, blood in stool, constipation, nausea, rectal pain and vomiting.  Endocrine: Negative for polydipsia and polyuria.  Genitourinary:  Negative for dysuria, frequency and urgency.  Musculoskeletal:  Negative for arthralgias, back pain and myalgias.       Left hand pain   Skin:   Negative for pallor and rash.       Left hand wound - from skin cance/skin graft   Allergic/Immunologic: Negative for environmental allergies.  Neurological:  Negative for dizziness, syncope and headaches.  Hematological:  Negative for adenopathy. Does not bruise/bleed easily.  Psychiatric/Behavioral:  Positive for sleep disturbance. Negative for decreased concentration and dysphoric mood. The patient is not nervous/anxious.        Not sleeping well due to post operative hand pain       Objective:   Physical Exam Constitutional:      General: She is not in acute distress.    Appearance: Normal appearance. She is well-developed and normal weight. She is not ill-appearing or diaphoretic.     Comments: Frail appearing Has right hand in a sling  HENT:     Head: Normocephalic and atraumatic.     Mouth/Throat:     Mouth: Mucous membranes are moist.  Eyes:     General:        Right eye: No discharge.        Left eye: No discharge.     Conjunctiva/sclera: Conjunctivae normal.     Pupils: Pupils are equal, round, and reactive to light.  Neck:     Thyroid: No thyromegaly.     Vascular: No carotid bruit or JVD.     Comments: No thyroid enlargement or asymmetry noted  Cardiovascular:     Rate and Rhythm: Regular rhythm. Tachycardia present.     Heart sounds: Normal heart sounds.     No gallop.  Pulmonary:     Effort: Pulmonary effort is normal. No respiratory distress.     Breath sounds: Normal breath sounds. No stridor. No wheezing, rhonchi or rales.  Abdominal:     General: There is no distension or abdominal bruit.     Palpations: Abdomen is soft.  Musculoskeletal:     Cervical back: Normal range of motion and neck supple. No rigidity or tenderness.     Right lower leg: No edema.     Left lower leg: No edema.  Lymphadenopathy:     Cervical: No cervical adenopathy.  Skin:    General: Skin is warm and dry.     Coloration: Skin is not pale.     Findings: Erythema present. No  rash.     Comments: Left hand is bandaged/ erythema noted  In a sling  Form recent derm surgery   Neurological:     Mental Status: She is alert.     Motor: No weakness.     Coordination: Coordination normal.     Deep Tendon Reflexes: Reflexes are normal and symmetric. Reflexes normal.  Psychiatric:        Mood and Affect: Mood normal.           Assessment & Plan:   Problem List Items Addressed This Visit       Digestive   Chronic diarrhea    Chronic  Has had cholestyramine from GI in past  Reviewed last  GI note from past winter  In past has blamed on post ccy   Stressful period with hand cancer surgery  Also TSH is low -could add to it         Endocrine   Hypothyroidism - Primary    Lab Results  Component Value Date   TSH <0.01 (L) 06/18/2023    Now off all levothyroxine (was last 88 mcg) Not a lot of clinical change (but recent surgery on left hand has made her feel worse)  Weight is down some= she blames hand pain  Some loose stool (this is acute on chronic)  Vitals are stable  No tremor on exam No thyroid changes on exam       Relevant Orders   TSH   T4, free     Musculoskeletal and Integument   Squamous cell cancer of skin of left hand    Squamous cell carcinoma Reviewed last dermatology note Had Moh's and also skin graft Family member doing dressing changes  Using mupirocin  Pt has had significant discomfort/ struggling to get over this  Continues follow up with dermatologh        Other   Dark stools    Pt has history of intermittent diarrhea as her baseline  New change in thyroid (dec TSH) and recent surgery for left hand skin cancer noted  More loose stool recently  Per pt stools are darker than usual  Instructed her to check labels and make sure she has no iron supplement she was not aware of  Declines abd pain (other than cramping to have bm)   Given heme cards today  Cbc also added to labs         Relevant Orders   CBC with  Differential/Platelet   Hemoccult Cards (X3 cards)

## 2023-07-07 NOTE — Patient Instructions (Addendum)
Check your bottles Let us know if any of your medicine have iron in them  Lab today for blood count and thyroid   Take care of yourself   Do the stool cards and get them back to [place] (looking for blood in stool)   I hope your hand heals up soon

## 2023-07-07 NOTE — Assessment & Plan Note (Addendum)
Chronic  Has had cholestyramine from GI in past  Reviewed last GI note from past winter  In past has blamed on post ccy   Stressful period with hand cancer surgery  Also TSH is low -could add to it

## 2023-07-07 NOTE — Addendum Note (Signed)
Addended by: Roxy Manns A on: 07/07/2023 07:02 PM   Modules accepted: Orders

## 2023-07-07 NOTE — Assessment & Plan Note (Signed)
Lab Results  Component Value Date   TSH <0.01 (L) 06/18/2023    Now off all levothyroxine (was last 88 mcg) Not a lot of clinical change (but recent surgery on left hand has made her feel worse)  Weight is down some= she blames hand pain  Some loose stool (this is acute on chronic)  Vitals are stable  No tremor on exam No thyroid changes on exam

## 2023-07-08 ENCOUNTER — Other Ambulatory Visit (INDEPENDENT_AMBULATORY_CARE_PROVIDER_SITE_OTHER): Payer: Self-pay

## 2023-07-08 DIAGNOSIS — R195 Other fecal abnormalities: Secondary | ICD-10-CM

## 2023-07-09 LAB — HEMOCCULT SLIDES (X 3 CARDS)
Fecal Occult Blood: NEGATIVE
OCCULT 1: NEGATIVE
OCCULT 2: NEGATIVE
OCCULT 3: NEGATIVE
OCCULT 4: NEGATIVE
OCCULT 5: NEGATIVE

## 2023-07-15 ENCOUNTER — Encounter: Payer: Self-pay | Admitting: Dermatology

## 2023-07-15 ENCOUNTER — Other Ambulatory Visit: Payer: Self-pay

## 2023-07-15 ENCOUNTER — Ambulatory Visit: Payer: Medicare Other | Admitting: Dermatology

## 2023-07-15 VITALS — BP 120/80

## 2023-07-15 DIAGNOSIS — Z48817 Encounter for surgical aftercare following surgery on the skin and subcutaneous tissue: Secondary | ICD-10-CM

## 2023-07-15 DIAGNOSIS — Z85828 Personal history of other malignant neoplasm of skin: Secondary | ICD-10-CM

## 2023-07-15 DIAGNOSIS — C4492 Squamous cell carcinoma of skin, unspecified: Secondary | ICD-10-CM

## 2023-07-15 MED ORDER — MUPIROCIN 2 % EX OINT: 1.0000 | TOPICAL_OINTMENT | Freq: Two times a day (BID) | CUTANEOUS | 2 refills | Status: DC

## 2023-07-15 NOTE — Progress Notes (Signed)
Pt requested refill of mupirocin

## 2023-07-15 NOTE — Progress Notes (Signed)
   Follow-Up Visit   Subjective  Linda Mcgrath is a 89 y.o. female who presents for the following: Mohs on his left dorsal hand.   The following portions of the chart were reviewed this encounter and updated as appropriate: medications, allergies, medical history  Review of Systems:  No other skin or systemic complaints except as noted in HPI or Assessment and Plan.  Objective  Well appearing patient in no apparent distress; mood and affect are within normal limits.   A focused examination was performed of the following areas:   Left dorsal hand   Relevant exam findings are noted in the Assessment and Plan.      Assessment & Plan   Wound Check s/p Mohs for SCC x 2 repaired with FTSG Exam: well healing spots on left hand from Mohs surgery  - Reassured appearance of graft is looking good - Recommend continue copious amounts of mupirocin and vaseline to wound to keep it covered and moist - Donor site appears well healed on abdomen - Wound re-bandaged - Continue mupirocin daily with a bandage.   Return in about 3 weeks (around 08/05/2023) for wound check.  I, Tillie Fantasia, CMA, am acting as scribe for Gwenith Daily, MD.   Documentation: I have reviewed the above documentation for accuracy and completeness, and I agree with the above.  Gwenith Daily, MD

## 2023-07-15 NOTE — Patient Instructions (Signed)

## 2023-07-23 ENCOUNTER — Encounter: Payer: Self-pay | Admitting: *Deleted

## 2023-07-23 ENCOUNTER — Telehealth: Payer: Self-pay | Admitting: *Deleted

## 2023-07-23 NOTE — Telephone Encounter (Deleted)
Per PCP pt needs lab appt in 2 weeks to recheck thyroid labs (non fasting) please schedule

## 2023-07-23 NOTE — Telephone Encounter (Signed)
error 

## 2023-08-05 ENCOUNTER — Ambulatory Visit (INDEPENDENT_AMBULATORY_CARE_PROVIDER_SITE_OTHER): Payer: Medicare Other | Admitting: Dermatology

## 2023-08-05 ENCOUNTER — Encounter: Payer: Self-pay | Admitting: Dermatology

## 2023-08-05 VITALS — BP 122/74 | HR 69

## 2023-08-05 DIAGNOSIS — Z48817 Encounter for surgical aftercare following surgery on the skin and subcutaneous tissue: Secondary | ICD-10-CM

## 2023-08-05 DIAGNOSIS — Z85828 Personal history of other malignant neoplasm of skin: Secondary | ICD-10-CM

## 2023-08-05 DIAGNOSIS — T1490XD Injury, unspecified, subsequent encounter: Secondary | ICD-10-CM

## 2023-08-05 DIAGNOSIS — C4492 Squamous cell carcinoma of skin, unspecified: Secondary | ICD-10-CM

## 2023-08-05 NOTE — Patient Instructions (Signed)

## 2023-08-05 NOTE — Progress Notes (Signed)
   New Patient Visit   Subjective  Linda Mcgrath is a 89 y.o. female who presents for the following: follow up from Mohs surgery   The patient presents for follow up from Mohs surgery for a SCC on the Left hand, treated on 06/16/23, repaired with FTSG. The patient has been bandaging the wound as directed. The endorse the following concerns: no questions or concerns at this time.  The following portions of the chart were reviewed this encounter and updated as appropriate: medications, allergies, medical history  Review of Systems:  No other skin or systemic complaints except as noted in HPI or Assessment and Plan.  Objective  Well appearing patient in no apparent distress; mood and affect are within normal limits.  A full examination was performed including scalp, head, face and left hand All findings within normal limits unless otherwise noted below.  Healing wound with mild erythema  Relevant physical exam findings are noted in the Assessment and Plan.   Assessment & Plan   Healing s/p Mohs for [facility], treated on 06/16/23, repaired with FTSG - Reassured that wound is healing well - No evidence of infection - No swelling, induration, purulence, dehiscence, or tenderness out of proportion to the clinical exam, see photo above - Discussed that scars take up to 12 months to mature from the date of surgery - Recommend SPF 30+ to scar daily to prevent purple color from UV exposure during scar maturation process - Discussed that erythema and raised appearance of scar will fade over the next 4-6 months - OK to start scar massage at 4-6 weeks post-op - Can consider silicone based products for scar healing starting at 6 weeks post-op - Ok to discontinue ointment daily to wound    Return in about 2 months (around 10/06/2023) for f/u.  Dominga Ferry, Surg Tech III, am acting as scribe for Gwenith Daily, MD.   Documentation: I have reviewed the above documentation for accuracy and  completeness, and I agree with the above.  Gwenith Daily, MD

## 2023-08-12 ENCOUNTER — Other Ambulatory Visit (INDEPENDENT_AMBULATORY_CARE_PROVIDER_SITE_OTHER): Payer: Medicare Other

## 2023-08-12 DIAGNOSIS — E039 Hypothyroidism, unspecified: Secondary | ICD-10-CM | POA: Diagnosis not present

## 2023-08-12 LAB — TSH: TSH: 42.67 u[IU]/mL — ABNORMAL HIGH (ref 0.35–5.50)

## 2023-08-12 LAB — T4, FREE: Free T4: 0.27 ng/dL — ABNORMAL LOW (ref 0.60–1.60)

## 2023-08-14 ENCOUNTER — Telehealth: Payer: Self-pay | Admitting: *Deleted

## 2023-08-14 MED ORDER — LEVOTHYROXINE SODIUM 50 MCG PO TABS: 50.0000 ug | ORAL_TABLET | Freq: Every day | ORAL | 1 refills | Status: DC

## 2023-08-14 NOTE — Telephone Encounter (Signed)
Pt is schedule labs

## 2023-08-14 NOTE — Telephone Encounter (Signed)
Pt notified of lab results and Dr. Royden Purl comments. Rx sent to pharmacy.  ** pt couldn't schedule lab appt because she was driving she ask if the schedulers can call her back later today and schedule non fasting lab appt to recheck thyroid levels in a month. Thanks**

## 2023-08-14 NOTE — Telephone Encounter (Signed)
-----   Message from [place] sent at 08/12/2023  7:29 PM EST ----- TSH went way up off levothyroxine (and FT4 low)  Was previously on 88 mcg before stopping it  How is she feeling?  I would like to start her back on 50 mcg daily (send 30 with 1 refill) and re check TSH in about a month

## 2023-08-22 ENCOUNTER — Ambulatory Visit (INDEPENDENT_AMBULATORY_CARE_PROVIDER_SITE_OTHER): Payer: Medicare Other | Admitting: Family Medicine

## 2023-08-22 ENCOUNTER — Encounter: Payer: Self-pay | Admitting: Family Medicine

## 2023-08-22 VITALS — BP 134/78 | HR 66 | Temp 97.4°F | Ht 62.0 in | Wt 149.1 lb

## 2023-08-22 DIAGNOSIS — C44629 Squamous cell carcinoma of skin of left upper limb, including shoulder: Secondary | ICD-10-CM

## 2023-08-22 DIAGNOSIS — E039 Hypothyroidism, unspecified: Secondary | ICD-10-CM | POA: Diagnosis not present

## 2023-08-22 DIAGNOSIS — L570 Actinic keratosis: Secondary | ICD-10-CM | POA: Diagnosis not present

## 2023-08-22 NOTE — Assessment & Plan Note (Signed)
Lab Results  Component Value Date   TSH 42.67 (H) 08/12/2023   Now back on levothyroxine 50 mcg daily  Skin is dry Plan re check 4-6 wk

## 2023-08-22 NOTE — Patient Instructions (Addendum)
We need to get you back to the regular dermatologist to address the keratoses on your arms/hands /legs  Continue to protect yourself from the sun   Moisturize with a non scented lotion or cream (eucerin and lubriderm are good brands)  Then cover with aquaphor (which seals in the moisturizer) to prevent itching and pain   Avoid hot water and harsh detergents   I put the referral in for dermatology Please let us know if you don't hear in 1-2 weeks (or you can go head and call [place] [facility] for appointment)   For dry skin you can put a vaporizer/humidifier in bedroom to help the dry air

## 2023-08-22 NOTE — Assessment & Plan Note (Addendum)
Left hand appears to be healing well s/p moh's and skin graft  Reviewed last derm note and path report

## 2023-08-22 NOTE — Progress Notes (Signed)
Subjective:    Patient ID: Linda Mcgrath, female    DOB: Feb 05, 1933, 89 y.o.   MRN: [id number]  HPI  Wt Readings from Last 3 Encounters:  08/22/23 149 lb 2 oz (67.6 kg)  07/07/23 150 lb 8 oz (68.3 kg)  04/23/23 155 lb 4 oz (70.4 kg)   27.28 kg/m  Vitals:   08/22/23 1155  BP: 134/78  Pulse: 66  Temp: (!) 97.4 F (36.3 C)  SpO2: 96%    Pt presents with c/o skin spots  Arms/hands and legs   Rash started -after skin graft on left hand for SCC on 10/21  They did use betadyne (no history of allergy)   Started on wrist of affected hand  Then both arms  Bumps  Hard areas  Then on legs  Few on right neck  Sparing face and scalp   Sore to the touch (moreso than itchy)  Do not break open or drain  Just got big and hard          Saw derm earlier this mo SCC on hand / had Mohs and FTSG Has history of AK also Lichen planus (oral and skin) in past also   Still struggling with hypothyroidism Lab Results  Component Value Date   TSH 42.67 (H) 08/12/2023   After stopping levothyroxine (last dose prior was ) for tsh under 0.01 , tsh shot up  Now back on 50 mcg since 12/19       Patient Active Problem List   Diagnosis Date Noted   Dark stools 07/07/2023   Chronic diarrhea 04/23/2023   Squamous cell cancer of skin of left hand 04/23/2023   Abnormal urinalysis 03/10/2023   Actinic keratosis 03/10/2023   TMJ arthralgia 01/13/2023   Right shoulder pain 10/23/2022   Urinary frequency 03/26/2022   Chronic upper back pain 08/30/2020   Aortic atherosclerosis (HCC) 05/31/2020   Abnormal CT scan, small bowel 05/18/2020   Insomnia 12/15/2019   Chronic low back pain 10/18/2019   Osteoarthritis of right knee 08/02/2019   S/P lumbar spinal fusion 07/31/2017   H/O atrial flutter 03/06/2016   Obesity 11/06/2015   Urge incontinence 01/24/2015   Estrogen deficiency 10/25/2014   Lichen planus 12/20/2013   Prediabetes 06/03/2011   HYPERTENSION, BENIGN  ESSENTIAL 10/23/2010   Osteoarthrosis, hand 01/24/2010   Hypothyroidism 08/24/2008   Vitamin D deficiency 06/06/2008   Hyperlipidemia 06/06/2008   MENOPAUSAL SYNDROME 03/29/2008   Osteoporosis 03/29/2008   Allergic rhinitis 01/25/2008   IBS 01/25/2008   Osteoarthritis 01/25/2008   FIBROMYALGIA 01/25/2008   Past Medical History:  Diagnosis Date   Allergic rhinitis    Arthritis    Asthma    Cataract    Bil/lens implant   Colon polyp 1999   small polyp   Diverticulosis    Dysrhythmia    Fibromyalgia    Hyperlipidemia    Hypothyroidism    Internal hemorrhoids    Leg cramps    Lichen planus    Lung nodule    stable LUL 9 mm   Menopausal syndrome    Osteoarthritis    UTI (urinary tract infection)    Past Surgical History:  Procedure Laterality Date   Abd U/S  11/1998   negative   Abd U/S  01/2001   gallbladder polyps   ABDOMINAL HYSTERECTOMY  1975   total-fibroids   APPENDECTOMY  1975   BREAST BIOPSY     benign   BREAST EXCISIONAL BIOPSY Left 1957   CARDIOVERSION N/A  03/07/2016   Procedure: CARDIOVERSION;  Surgeon: Yates Decamp, MD;  Location: [facility] ENDOSCOPY;  Service: Cardiovascular;  Laterality: N/A;   CATARACT EXTRACTION     bilateral   CHOLECYSTECTOMY     COLONOSCOPY  1998   Diverticulosis; polyp\   COLONOSCOPY  09/2002   Diverticulosis, hem   COLONOSCOPY  12/2007   diverticulosis, polyp   DEXA  10/2001   osteopenia   ELECTROPHYSIOLOGIC STUDY N/A 04/03/2016   Procedure: A-Flutter Ablation;  Surgeon: Will [name], MD;  Location: [facility] INVASIVE CV LAB;  Service: Cardiovascular;  Laterality: N/A;   ESOPHAGOGASTRODUODENOSCOPY  2004   [place] scan  01/2001   Negative   KNEE SURGERY     right knee / 11/2004   LAMINECTOMY WITH POSTERIOR LATERAL ARTHRODESIS LEVEL 2 N/A 07/31/2017   Procedure: DECOMPRESSIVE LAMINECTOMY LUMABR FOUR-FIVE, LUMBAR FIVE-SACRAL ONE;  Surgeon: Tia Alert, MD;  Location: [facility] OR;  Service: Neurosurgery;  Laterality: N/A;   laser  surgery for glaucoma Left 09/21/2014   MULTIPLE TOOTH EXTRACTIONS     rentinal tear     ROTATOR CUFF REPAIR     x 2 /right shoulder   SKIN CANCER EXCISION     pre-melanoma / on face   TEE WITHOUT CARDIOVERSION N/A 03/07/2016   Procedure: TRANSESOPHAGEAL ECHOCARDIOGRAM (TEE);  Surgeon: Yates Decamp, MD;  Location: [facility] ENDOSCOPY;  Service: Cardiovascular;  Laterality: N/A;   TOE SURGERY     rt foot    Social History   Tobacco Use   Smoking status: Never   Smokeless tobacco: Never  Vaping Use   Vaping status: Never Used  Substance Use Topics   Alcohol use: No    Alcohol/week: 0.0 standard drinks of alcohol    Comment: occasional-rare   Drug use: No   Family History  Problem Relation Age of Onset   Parkinsonism Father    Colon cancer Brother    Allergies Mother    Heart disease Mother    Kidney failure Son        congenital  (kidney transplant)    Heart failure Son        died suddenly of CHF    Allergies  Allergen Reactions   Shellfish Allergy Anaphylaxis and Nausea And Vomiting   Tetracycline Hives, Nausea And Vomiting, Rash and Other (See Comments)    SYSTEMIC REACTION: heart palpitations,muscle spasms, vomiting   Amlodipine Besylate Other (See Comments)    REACTION: muscle spasms, extremity swelling, low bp   Ciprofloxacin Other (See Comments)    REACTION: muscle spasms, insomnia   Codeine Other (See Comments)    REACTION: hallucinations   Propoxyphene Hcl Other (See Comments)    Unknown   Sulfamethoxazole-Trimethoprim Nausea Only and Other (See Comments)    REACTION: dizzy, could not focus eyes and could not concentrate and nausea   Tape Other (See Comments)    BANDAIDS TAKE OFF HER SKIN; PLEASE USE COBAN OR PAPER    Xarelto [Rivaroxaban] Other (See Comments)    Aches and pains and nervousness   Amoxicillin Rash   Other Swelling, Rash and Other (See Comments)    Has autoimmune disorder and spices erode her salivary glands and affects ankles and mouth DIRECTLY  (takes off 1st layer of skin and leaves her unable to walk)   Penicillins Swelling, Rash and Other (See Comments)    Has patient had a PCN reaction causing immediate rash, facial/tongue/throat swelling, SOB or lightheadedness with hypotension: Yes Has patient had a PCN reaction causing severe rash involving mucus  membranes or skin necrosis: No Has patient had a PCN reaction that required hospitalization No Has patient had a PCN reaction occurring within the last 10 years: No If all of the above answers are "NO", then may proceed with Cephalosporin use.    Current Outpatient Medications on File Prior to Visit  Medication Sig Dispense Refill   alendronate (FOSAMAX) 70 MG tablet TAKE 1 TABLET(70 MG) BY MOUTH EVERY 7 DAYS WITH A FULL GLASS OF WATER AND ON AN EMPTY STOMACH 12 tablet 1   Ascorbic Acid (VITAMIN C PO) Take 500 mg by mouth daily.     B Complex Vitamins (VITAMIN B COMPLEX PO) Take 1 tablet by mouth daily.     B COMPLEX, FOLIC ACID, PO Take 666 mcg by mouth daily.     Biotin 10 MG TABS Take 1 tablet by mouth every morning.      Calcium Citrate-Vitamin D 250-200 MG-UNIT TABS Take 1 tablet by mouth daily.     cholestyramine (QUESTRAN) 4 g packet MIX AND DRINK 1 PACKET DAILY 90 each 1   Cranberry 180 MG CAPS Take 2 capsules by mouth daily.     cyclobenzaprine (FLEXERIL) 10 MG tablet Take 1 tablet (10 mg total) by mouth 3 (three) times daily as needed (pain, muscle spasms). 50 tablet 0   dextromethorphan-guaiFENesin (MUCINEX DM) 30-600 MG 12hr tablet Take 1 tablet by mouth 2 (two) times daily. 15 tablet 0   Fexofenadine HCl (MUCINEX ALLERGY PO) Take 1 tablet by mouth daily as needed (Allergies).      fluticasone (FLONASE) 50 MCG/ACT nasal spray Place 1 spray into both nostrils daily for 3 days. 16 g 0   folic acid (FOLVITE) 1 MG tablet Take 1 mg by mouth daily.     levothyroxine (SYNTHROID) 50 MCG tablet Take 1 tablet (50 mcg total) by mouth daily before breakfast. 30 tablet 1   loratadine  (CLARITIN) 10 MG tablet Take 10 mg by mouth daily as needed for allergies.      MAGNESIUM-OXIDE PO Take 1 tablet by mouth daily.     Multiple Vitamin (MULTIVITAMIN) capsule Take 1 capsule by mouth daily.     mupirocin ointment (BACTROBAN) 2 % Apply 1 Application topically 2 (two) times daily. 30 g 2   Psyllium (METAMUCIL FIBER PO) Take by mouth at bedtime. 2 tablespoon     solifenacin (VESICARE) 10 MG tablet Take 1 tablet (10 mg total) by mouth daily. 90 tablet 1   TURMERIC PO Take 1 capsule by mouth 2 (two) times daily.      vitamin B-12 (CYANOCOBALAMIN) 500 MCG tablet Take 500 mcg by mouth daily.     Vitamin D, Cholecalciferol, 50 MCG (2000 UT) CAPS Take 2 tablets by mouth daily.     No current facility-administered medications on file prior to visit.    Review of Systems  Constitutional:  Negative for activity change, appetite change, fatigue, fever and unexpected weight change.  HENT:  Negative for congestion, ear pain, rhinorrhea, sinus pressure and sore throat.   Eyes:  Negative for pain, redness and visual disturbance.  Respiratory:  Negative for cough, shortness of breath and wheezing.   Cardiovascular:  Negative for chest pain and palpitations.  Gastrointestinal:  Negative for abdominal pain, blood in stool, constipation and diarrhea.  Endocrine: Negative for polydipsia and polyuria.  Genitourinary:  Negative for dysuria, frequency and urgency.  Musculoskeletal:  Negative for arthralgias, back pain and myalgias.  Skin:  Positive for wound. Negative for pallor and rash.  dry  Allergic/Immunologic: Negative for environmental allergies.  Neurological:  Negative for dizziness, syncope and headaches.  Hematological:  Negative for adenopathy. Does not bruise/bleed easily.  Psychiatric/Behavioral:  Negative for decreased concentration and dysphoric mood. The patient is nervous/anxious.        Objective:   Physical Exam Constitutional:      General: She is not in acute  distress.    Appearance: Normal appearance. She is well-developed and normal weight. She is not ill-appearing or diaphoretic.  HENT:     Head: Normocephalic and atraumatic.  Eyes:     Conjunctiva/sclera: Conjunctivae normal.     Pupils: Pupils are equal, round, and reactive to light.  Neck:     Thyroid: No thyromegaly.     Vascular: No carotid bruit or JVD.  Cardiovascular:     Rate and Rhythm: Normal rate and regular rhythm.     Heart sounds: Normal heart sounds.     No gallop.  Pulmonary:     Effort: Pulmonary effort is normal. No respiratory distress.     Breath sounds: Normal breath sounds. No wheezing or rales.  Abdominal:     General: There is no abdominal bruit.  Musculoskeletal:     Cervical back: Normal range of motion and neck supple. No tenderness.     Right lower leg: No edema.     Left lower leg: No edema.  Lymphadenopathy:     Cervical: No cervical adenopathy.  Skin:    General: Skin is warm and dry.     Coloration: Skin is not pale.     Findings: No rash.     Comments: Solar aging and changes  Sks scattered  AKS scattered-most prominent on back of hands/forearms and lower legs   Palms/soles/scalp and face are clear   Few sks on neck   Healing skin graft on left dorsal hand looks good   Neurological:     Mental Status: She is alert.     Coordination: Coordination normal.     Deep Tendon Reflexes: Reflexes are normal and symmetric. Reflexes normal.  Psychiatric:        Mood and Affect: Mood normal.           Assessment & Plan:   Problem List Items Addressed This Visit       Endocrine   Hypothyroidism   Lab Results  Component Value Date   TSH 42.67 (H) 08/12/2023   Now back on levothyroxine 50 mcg daily  Skin is dry Plan re check 4-6 wk        Musculoskeletal and Integument   Squamous cell cancer of skin of left hand   Left hand appears to be healing well s/p moh's and skin graft  Reviewed last derm note and path report          Actinic keratosis - Primary   Multiple lesions with keratosis/some areas with erythema on arms and legs and neck  Had recent SCC on left hand with moh's and skin graft   Suspect she will need more treatment  Encouraged moisturizer (scent free) with aquaphor to cover as needed  Protect from sun Ref to dermatology for further eval       Relevant Orders   Ambulatory referral to Dermatology

## 2023-08-22 NOTE — Assessment & Plan Note (Signed)
Multiple lesions with keratosis/some areas with erythema on arms and legs and neck  Had recent SCC on left hand with moh's and skin graft   Suspect she will need more treatment  Encouraged moisturizer (scent free) with aquaphor to cover as needed  Protect from sun Ref to dermatology for further eval

## 2023-08-29 DIAGNOSIS — N3941 Urge incontinence: Secondary | ICD-10-CM | POA: Diagnosis not present

## 2023-08-29 DIAGNOSIS — R350 Frequency of micturition: Secondary | ICD-10-CM | POA: Diagnosis not present

## 2023-08-29 DIAGNOSIS — R351 Nocturia: Secondary | ICD-10-CM | POA: Diagnosis not present

## 2023-09-06 ENCOUNTER — Other Ambulatory Visit: Payer: Self-pay | Admitting: Family Medicine

## 2023-09-14 ENCOUNTER — Telehealth: Payer: Self-pay | Admitting: Family Medicine

## 2023-09-14 DIAGNOSIS — E039 Hypothyroidism, unspecified: Secondary | ICD-10-CM

## 2023-09-14 NOTE — Telephone Encounter (Signed)
-----   Message from Lovena Neighbours sent at 09/01/2023  2:05 PM EST ----- Regarding: Labs for Monday 1.20.25 Please put lab orders in future. Thank you, Denny Peon

## 2023-09-15 ENCOUNTER — Other Ambulatory Visit: Payer: Medicare Other

## 2023-09-18 ENCOUNTER — Ambulatory Visit: Payer: Self-pay | Admitting: Family Medicine

## 2023-09-18 ENCOUNTER — Ambulatory Visit (INDEPENDENT_AMBULATORY_CARE_PROVIDER_SITE_OTHER)
Admission: RE | Admit: 2023-09-18 | Discharge: 2023-09-18 | Disposition: A | Discharge status: Home / Self Care | Payer: Medicare Other | Source: Ambulatory Visit | Attending: Family Medicine | Admitting: Family Medicine

## 2023-09-18 ENCOUNTER — Encounter: Payer: Self-pay | Admitting: Family Medicine

## 2023-09-18 ENCOUNTER — Ambulatory Visit (INDEPENDENT_AMBULATORY_CARE_PROVIDER_SITE_OTHER): Payer: Medicare Other | Admitting: Family Medicine

## 2023-09-18 VITALS — BP 120/78 | HR 112 | Temp 97.6°F | Ht 62.0 in | Wt 148.2 lb

## 2023-09-18 DIAGNOSIS — M533 Sacrococcygeal disorders, not elsewhere classified: Secondary | ICD-10-CM

## 2023-09-18 DIAGNOSIS — M545 Low back pain, unspecified: Secondary | ICD-10-CM

## 2023-09-18 DIAGNOSIS — Z043 Encounter for examination and observation following other accident: Secondary | ICD-10-CM | POA: Diagnosis not present

## 2023-09-18 DIAGNOSIS — W19XXXA Unspecified fall, initial encounter: Secondary | ICD-10-CM

## 2023-09-18 DIAGNOSIS — M5125 Other intervertebral disc displacement, thoracolumbar region: Secondary | ICD-10-CM | POA: Diagnosis not present

## 2023-09-18 DIAGNOSIS — M4316 Spondylolisthesis, lumbar region: Secondary | ICD-10-CM | POA: Diagnosis not present

## 2023-09-18 DIAGNOSIS — M48061 Spinal stenosis, lumbar region without neurogenic claudication: Secondary | ICD-10-CM | POA: Diagnosis not present

## 2023-09-18 DIAGNOSIS — M47816 Spondylosis without myelopathy or radiculopathy, lumbar region: Secondary | ICD-10-CM | POA: Diagnosis not present

## 2023-09-18 MED ORDER — TIZANIDINE HCL 4 MG PO TABS: 4.0000 mg | ORAL_TABLET | Freq: Every evening | ORAL | 1 refills | Status: DC | PRN

## 2023-09-18 NOTE — Telephone Encounter (Signed)
  Chief Complaint: Back pain Symptoms: Back pain Frequency: Intermittent with movement post fall. Pertinent Negatives: Patient denies Numbness/weakness/tingling of extremities, head injury, abdominal pain, loss of bowel/urinary control, fever Disposition: [] ED /[] Urgent Care (no appt availability in office) / [x] Appointment(In office/virtual)/ []  [place] [facility]/ [] Home Care/ [] Refused Recommended Disposition /[] [place] [facility]/ []  Follow-up with PCP Additional Notes: Patients admits to tripping over something and falling on back on Monday, now complains of lower back pain to upper hips. Patient denies taking any medication for pain therapy. Patient stated pain was rated a 2 or 3 on the pain scale, but based on description of pain, it is suspected pain is higher with pain scale not totally being understood. Patient denies numbness/tingling or weakness to extremities. Patient denies head injury during fall and loss of bladder/bowel control. Patient advised by this RN to be seen within 3 days per protocol. Patient advised by this RN to use tylenol or motrin for pain control until appointment. Patient agreeable and verbalized understanding.   Copied from CRM [id number]. Topic: Clinical - Red Word Triage >> Sep 18, 2023 11:23 AM Almira Coaster wrote: Red Word that prompted transfer to Nurse Triage: Patient fell on Monday 09/15/2023 and is starting develop pain and soreness on her back. Reason for Disposition  [1] Age > 50 AND [2] no history of prior similar back pain  Answer Assessment - Initial Assessment Questions 1. ONSET: "When did the pain begin?"      Couple days ago 2. LOCATION: "Where does it hurt?" (upper, mid or lower back)     Lower back to hip area 3. SEVERITY: "How bad is the pain?"  (e.g., Scale 1-10; mild, moderate, or severe)   - MILD (1-3): Doesn't interfere with normal activities.    - MODERATE (4-7): Interferes with normal activities or awakens from sleep.    - SEVERE  (8-10): Excruciating pain, unable to do any normal activities.      Mild per patient 4. PATTERN: "Is the pain constant?" (e.g., yes, no; constant, intermittent)      Comes and goes with movement mostly. 5. RADIATION: "Does the pain shoot into your legs or somewhere else?"     Denies 6. CAUSE:  "What do you think is causing the back pain?"      Fall - Monday. Landed on back and now is experiencing back pain 7. BACK OVERUSE:  "Any recent lifting of heavy objects, strenuous work or exercise?"     Denies 8. MEDICINES: "What have you taken so far for the pain?" (e.g., nothing, acetaminophen, NSAIDS)     Denies 9. NEUROLOGIC SYMPTOMS: "Do you have any weakness, numbness, or problems with bowel/bladder control?"     Denies 10. OTHER SYMPTOMS: "Do you have any other symptoms?" (e.g., fever, abdomen pain, burning with urination, blood in urine)      Denies  Protocols used: Back Pain-A-AH

## 2023-09-18 NOTE — Telephone Encounter (Signed)
Will route to PCP as a FYI and Dr. Patsy Lager who is seeing pt today

## 2023-09-18 NOTE — Progress Notes (Signed)
Linda Mcgrath T. Linda Boughner, MD, CAQ Sports Medicine [facility] at [facility] [address] [place], [address]  Phone: [phone]  FAX: [phone]  Linda Mcgrath - 89 y.o. female  MRN [id number]  Date of Birth: 09-21-1932  Date: 09/18/2023  PCP: Judy Pimple, MD  Referral: Judy Pimple, MD  Chief Complaint  Patient presents with   Fall    On Monday   Back Pain    Low back-Worse with sitting    Subjective:   Linda Mcgrath is a 89 y.o. very pleasant female patient with Body mass index is 27.12 kg/m. who presents with the following:  The patient is a very pleasant 89 year old female and she presents after a traumatic fall onto her posterior buttocks and posterior pelvis and low back.  Date of injury: September 15, 2023  She was next to her porch and she got tangled up and fell and hit very hard from her fall.  She could not get up off of the ground, and she called one of her neighbors to help her stand up.  Since then, she has had some significant pain in the posterior buttocks and low back.  Pain was worse on the following day.  She has not had any swelling or bruising.  She is moving slower than she typically would move.  At baseline she is able to stand up from a seated position, stand up from a lying position and have no significant impairment.  She uses no assistive devices in general.  Review of Systems is noted in the HPI, as appropriate  Objective:   BP 120/78 (BP Location: Left Arm, Patient Position: Sitting, Cuff Size: Normal)   Pulse (!) 112   Temp 97.6 F (36.4 C) (Temporal)   Ht 5\' 2"  (1.575 m)   Wt 148 lb 4 oz (67.2 kg)   LMP 08/26/1969   SpO2 98%   BMI 27.12 kg/m   GEN: No acute distress; alert,appropriate. PULM: Breathing comfortably in no respiratory distress PSYCH: Normally interactive.   She was examined in a seated, standing, and recumbent position.  She does have significant tenderness from L4-S1  and maximal tenderness is in the posterior sacrum relatively diffusely. Nontender at the coccyx Paraspinous musculature is also tender from roughly L3-S1, as well  She is neurovascularly intact with intact strength and sensation bilaterally Full range of motion at the hips bilaterally Lateral hip and the GTB region is mildly tender  Laboratory and Imaging Data: DG Sacrum/Coccyx Result Date: 09/18/2023 CLINICAL DATA:  Evaluate for sacral fracture.  Fall.  Pain. EXAM: SACRUM AND COCCYX - 2+ VIEW COMPARISON:  CT abdomen and pelvis 03/09/2020 FINDINGS: Mild bilateral sacroiliac subchondral sclerosis. Bowel gas and stool obscure portions of the sacrum on frontal views. Diffuse decreased bone mineralization limits evaluation or acute fracture, however no definite acute fracture is seen. Moderate pubic symphysis joint space narrowing. Transitional lumbosacral anatomy with partial sacralization of L5. High grade 1 to low grade 2 anterolisthesis of L4 on L5, unchanged from prior. Mild-to-moderate bilateral superomedial femoroacetabular joint space narrowing. IMPRESSION: 1. No definite acute fracture is seen. Diffuse decreased bone mineralization limits evaluation for acute fracture. 2. Mild bilateral sacroiliac osteoarthritis. 3. Transitional lumbosacral anatomy with partial sacralization of L5. High grade 1 to low grade 2 anterolisthesis of L4 on L5, unchanged from prior. Electronically Signed   By: Neita Garnet M.D.   On: 09/18/2023 16:11   DG Lumbar Spine Complete Result Date: 09/18/2023 CLINICAL DATA:  Fall evaluate for compression fracture. EXAM: LUMBAR SPINE - COMPLETE 4+ VIEW COMPARISON:  Lumbar spine radiographs 04/24/2023, MRI lumbar spine 05/06/2023 chest two views 08/11/2021, CT chest 03/06/2016 FINDINGS: Counting down from T1 on CT chest 03/06/2016, there appear to be hypoplastic ribs at the vertebral body considered T12. The next 5 vertebral bodies are considered L1 through L5. Please note this is  a different numbering system compared to 05/06/2023 MRI lumbar spine. Partial sacralization of L5. There is 10 mm high grade 1 to low grade 2 anterolisthesis of L4 on L5, unchanged from prior. Within the limitations of decreased bone mineralization, no definite acute vertebral body compression fracture is seen. Mild retrolisthesis of T12 on L1, L1 on L2, and L2 on L3, similar to prior. Mild dextrocurvature centered at L2. Mild-to-moderate posterior T12-L1 through L4-5 disc space narrowing. Moderate to high-grade facet joint sclerosis and hypertrophy at L3-4 through L5-S1. Right upper quadrant cholecystectomy clips. IMPRESSION: 1. Within the limitations of decreased bone mineralization, no definite acute vertebral body compression fracture is seen. 2. High grade 1 to low grade 2 anterolisthesis of L4 on L5, unchanged from prior. 3. Mild-to-moderate posterior T12-L1 through L4-5 disc space narrowing. 4. Moderate to high-grade facet joint sclerosis and hypertrophy at L3-4 through L5-S1. Electronically Signed   By: Neita Garnet M.D.   On: 09/18/2023 16:07     Assessment and Plan:     ICD-10-CM   1. Pain in sacrum  M53.3 DG Sacrum/Coccyx    2. Acute midline low back pain without sciatica  M54.50 DG Lumbar Spine Complete    3. Fall, initial encounter  [email or web address] Skeens DG Lumbar Spine Complete    DG Sacrum/Coccyx     I agree with radiology, and I do not see an acute fracture.  She will have some significant bone contusion, predominantly in the sacrum.  I would anticipate 3 to 4 weeks for improvement.  She is going to take some Tylenol for pain, and also gave her some Zanaflex to take at nighttime.  If she has a hard time managing pain, tramadol would also be a reasonable idea.  Medication Management during today's office visit: Meds ordered this encounter  Medications   tiZANidine (ZANAFLEX) 4 MG tablet    Sig: Take 1 tablet (4 mg total) by mouth at bedtime as needed for muscle spasms.    Dispense:  30  tablet    Refill:  1   There are no discontinued medications.  Orders placed today for conditions managed today: Orders Placed This Encounter  Procedures   DG Lumbar Spine Complete   DG Sacrum/Coccyx    Disposition: No follow-ups on file.  [employer] speech-to-text software was used for transcription in this dictation.  Possible transcriptional errors can occur using Animal nutritionist.   Signed,  Elpidio Galea. Cinderella Christoffersen, MD   Outpatient Encounter Medications as of 09/18/2023  Medication Sig   alendronate (FOSAMAX) 70 MG tablet TAKE 1 TABLET(70 MG) BY MOUTH EVERY 7 DAYS WITH A FULL GLASS OF WATER AND ON AN EMPTY STOMACH   Ascorbic Acid (VITAMIN C PO) Take 500 mg by mouth daily.   B Complex Vitamins (VITAMIN B COMPLEX PO) Take 1 tablet by mouth daily.   B COMPLEX, FOLIC ACID, PO Take 666 mcg by mouth daily.   Biotin 10 MG TABS Take 1 tablet by mouth every morning.    Calcium Citrate-Vitamin D 250-200 MG-UNIT TABS Take 1 tablet by mouth daily.   cholestyramine (QUESTRAN) 4 g packet MIX AND DRINK  1 PACKET DAILY   Cranberry 180 MG CAPS Take 2 capsules by mouth daily.   cyclobenzaprine (FLEXERIL) 10 MG tablet Take 1 tablet (10 mg total) by mouth 3 (three) times daily as needed (pain, muscle spasms).   dextromethorphan-guaiFENesin (MUCINEX DM) 30-600 MG 12hr tablet Take 1 tablet by mouth 2 (two) times daily.   Fexofenadine HCl (MUCINEX ALLERGY PO) Take 1 tablet by mouth daily as needed (Allergies).    fluticasone (FLONASE) 50 MCG/ACT nasal spray Place 1 spray into both nostrils daily for 3 days.   folic acid (FOLVITE) 1 MG tablet Take 1 mg by mouth daily.   levothyroxine (SYNTHROID) 50 MCG tablet TAKE 1 TABLET BY MOUTH DAILY BEFORE BREAKFAST   loratadine (CLARITIN) 10 MG tablet Take 10 mg by mouth daily as needed for allergies.    MAGNESIUM-OXIDE PO Take 1 tablet by mouth daily.   Multiple Vitamin (MULTIVITAMIN) capsule Take 1 capsule by mouth daily.   mupirocin ointment (BACTROBAN)  2 % Apply 1 Application topically 2 (two) times daily.   Psyllium (METAMUCIL FIBER PO) Take by mouth at bedtime. 2 tablespoon   solifenacin (VESICARE) 10 MG tablet Take 1 tablet (10 mg total) by mouth daily.   tiZANidine (ZANAFLEX) 4 MG tablet Take 1 tablet (4 mg total) by mouth at bedtime as needed for muscle spasms.   TURMERIC PO Take 1 capsule by mouth 2 (two) times daily.    vitamin B-12 (CYANOCOBALAMIN) 500 MCG tablet Take 500 mcg by mouth daily.   Vitamin D, Cholecalciferol, 50 MCG (2000 UT) CAPS Take 2 tablets by mouth daily.   No facility-administered encounter medications on file as of 09/18/2023.

## 2023-10-01 ENCOUNTER — Ambulatory Visit: Payer: Medicare Other

## 2023-10-01 VITALS — Ht 62.0 in | Wt 148.0 lb

## 2023-10-01 DIAGNOSIS — Z0000 Encounter for general adult medical examination without abnormal findings: Secondary | ICD-10-CM | POA: Diagnosis not present

## 2023-10-01 NOTE — Progress Notes (Signed)
 Subjective:   LOWELL MCGURK is a 89 y.o. female who presents for Medicare Annual (Subsequent) preventive examination.  Visit Complete: Virtual I connected with  Eusebia B Martin-Higgins on 10/01/23 by a audio enabled telemedicine application and verified that I am speaking with the correct person using two identifiers.  Patient Location: Home  Provider Location: Office/Clinic  I discussed the limitations of evaluation and management by telemedicine. The patient expressed understanding and agreed to proceed.  Vital Signs: Because this visit was a virtual/telehealth visit, some criteria may be missing or patient reported. Any vitals not documented were not able to be obtained and vitals that have been documented are patient reported.  Patient Medicare AWV questionnaire was completed by the patient on (not done); I have confirmed that all information answered by patient is correct and no changes since this date.  Cardiac Risk Factors include: advanced age (>43men, >23 women);dyslipidemia;hypertension;sedentary lifestyle    Objective:    Today's Vitals   10/01/23 1346  Weight: 148 lb (67.1 kg)  Height: 5' 2 (1.575 m)   Body mass index is 27.07 kg/m.     10/01/2023    2:05 PM 09/30/2022   11:31 AM 06/10/2022   12:52 PM 09/27/2021   12:07 PM 05/17/2021    2:17 PM 09/26/2020   12:00 PM 10/20/2019    2:56 PM  Advanced Directives  Does Patient Have a Medical Advance Directive? Yes Yes No Yes No Yes Yes  Type of Estate Agent of [place];Living will Healthcare Power of [place];Living will  Healthcare Power of [place];Living will  Healthcare Power of [place];Living will Healthcare Power of Attorney  Does patient want to make changes to medical advance directive?  No - Patient declined  Yes (MAU/Ambulatory/Procedural Areas - Information given)     Copy of Healthcare Power of Attorney in Chart? No - copy requested No - copy requested    No - copy requested    Would patient like information on creating a medical advance directive?   No - Patient declined  No - Patient declined      Current Medications (verified) Outpatient Encounter Medications as of 10/01/2023  Medication Sig   alendronate  (FOSAMAX ) 70 MG tablet TAKE 1 TABLET(70 MG) BY MOUTH EVERY 7 DAYS WITH A FULL GLASS OF WATER AND ON AN EMPTY STOMACH   Ascorbic Acid  (VITAMIN C  PO) Take 500 mg by mouth daily.   B Complex Vitamins (VITAMIN B COMPLEX  PO) Take 1 tablet by mouth daily.   B COMPLEX, FOLIC ACID , PO Take 666 mcg by mouth daily.   Biotin  10 MG TABS Take 1 tablet by mouth every morning.    Calcium  Citrate-Vitamin D  250-200 MG-UNIT TABS Take 1 tablet by mouth daily.   cholestyramine  (QUESTRAN ) 4 g packet MIX AND DRINK 1 PACKET DAILY   Cranberry 180 MG CAPS Take 2 capsules by mouth daily.   cyclobenzaprine  (FLEXERIL ) 10 MG tablet Take 1 tablet (10 mg total) by mouth 3 (three) times daily as needed (pain, muscle spasms).   Fexofenadine HCl (MUCINEX  ALLERGY PO) Take 1 tablet by mouth daily as needed (Allergies).    fluticasone  (FLONASE ) 50 MCG/ACT nasal spray Place 1 spray into both nostrils daily for 3 days.   folic acid  (FOLVITE ) 1 MG tablet Take 1 mg by mouth daily.   levothyroxine  (SYNTHROID ) 50 MCG tablet TAKE 1 TABLET BY MOUTH DAILY BEFORE BREAKFAST   loratadine (CLARITIN) 10 MG tablet Take 10 mg by mouth daily as needed for allergies.  MAGNESIUM -OXIDE PO Take 1 tablet by mouth daily.   Multiple Vitamin (MULTIVITAMIN) capsule Take 1 capsule by mouth daily.   mupirocin  ointment (BACTROBAN ) 2 % Apply 1 Application topically 2 (two) times daily.   Psyllium (METAMUCIL FIBER PO) Take by mouth at bedtime. 2 tablespoon   solifenacin  (VESICARE ) 10 MG tablet Take 1 tablet (10 mg total) by mouth daily.   tiZANidine  (ZANAFLEX ) 4 MG tablet Take 1 tablet (4 mg total) by mouth at bedtime as needed for muscle spasms.   TURMERIC PO Take 1 capsule by mouth 2 (two) times daily.    vitamin B-12  (CYANOCOBALAMIN ) 500 MCG tablet Take 500 mcg by mouth daily.   Vitamin D , Cholecalciferol , 50 MCG (2000 UT) CAPS Take 2 tablets by mouth daily.   dextromethorphan-guaiFENesin  (MUCINEX  DM) 30-600 MG 12hr tablet Take 1 tablet by mouth 2 (two) times daily. (Patient not taking: Reported on 10/01/2023)   No facility-administered encounter medications on file as of 10/01/2023.    Allergies (verified) Shellfish allergy, Tetracycline, Amlodipine besylate, Ciprofloxacin , Codeine, Propoxyphene hcl, Sulfamethoxazole-trimethoprim, Tape, Xarelto  [rivaroxaban ], Amoxicillin, Other, and Penicillins   History: Past Medical History:  Diagnosis Date   Allergic rhinitis    Arthritis    Asthma    Cataract    Bil/lens implant   Colon polyp 1999   small polyp   Diverticulosis    Dysrhythmia    Fibromyalgia    Hyperlipidemia    Hypothyroidism    Internal hemorrhoids    Leg cramps    Lichen planus    Lung nodule    stable LUL 9 mm   Menopausal syndrome    Osteoarthritis    UTI (urinary tract infection)    Past Surgical History:  Procedure Laterality Date   Abd U/S  11/1998   negative   Abd U/S  01/2001   gallbladder polyps   ABDOMINAL HYSTERECTOMY  1975   total-fibroids   APPENDECTOMY  1975   BREAST BIOPSY     benign   BREAST EXCISIONAL BIOPSY Left 1957   CARDIOVERSION N/A 03/07/2016   Procedure: CARDIOVERSION;  Surgeon: Gordy Bergamo, MD;  Location: [facility] ENDOSCOPY;  Service: Cardiovascular;  Laterality: N/A;   CATARACT EXTRACTION     bilateral   CHOLECYSTECTOMY     COLONOSCOPY  1998   Diverticulosis; polyp\   COLONOSCOPY  09/2002   Diverticulosis, hem   COLONOSCOPY  12/2007   diverticulosis, polyp   DEXA  10/2001   osteopenia   ELECTROPHYSIOLOGIC STUDY N/A 04/03/2016   Procedure: A-Flutter Ablation;  Surgeon: Will Gladis Norton, MD;  Location: [facility] INVASIVE CV LAB;  Service: Cardiovascular;  Laterality: N/A;   ESOPHAGOGASTRODUODENOSCOPY  2004   [place] scan  01/2001   Negative   KNEE  SURGERY     right knee / 11/2004   LAMINECTOMY WITH POSTERIOR LATERAL ARTHRODESIS LEVEL 2 N/A 07/31/2017   Procedure: DECOMPRESSIVE LAMINECTOMY LUMABR FOUR-FIVE, LUMBAR FIVE-SACRAL ONE;  Surgeon: Joshua Alm RAMAN, MD;  Location: [facility] OR;  Service: Neurosurgery;  Laterality: N/A;   laser surgery for glaucoma Left 09/21/2014   MULTIPLE TOOTH EXTRACTIONS     rentinal tear     ROTATOR CUFF REPAIR     x 2 /right shoulder   SKIN CANCER EXCISION     pre-melanoma / on face   TEE WITHOUT CARDIOVERSION N/A 03/07/2016   Procedure: TRANSESOPHAGEAL ECHOCARDIOGRAM (TEE);  Surgeon: Gordy Bergamo, MD;  Location: [facility] ENDOSCOPY;  Service: Cardiovascular;  Laterality: N/A;   TOE SURGERY     rt foot  Family History  Problem Relation Age of Onset   Parkinsonism Father    Colon cancer Brother    Allergies Mother    Heart disease Mother    Kidney failure Son        congenital  (kidney transplant)    Heart failure Son        died suddenly of CHF    Social History   Socioeconomic History   Marital status: Widowed    Spouse name: Not on file   Number of children: 2   Years of education: Not on file   Highest education level: Not on file  Occupational History   Occupation: TRAVEL AGENT    Employer: [employer]  Tobacco Use   Smoking status: Never   Smokeless tobacco: Never  Vaping Use   Vaping status: Never Used  Substance and Sexual Activity   Alcohol use: No    Alcohol/week: 0.0 standard drinks of alcohol    Comment: occasional-rare   Drug use: No   Sexual activity: Never  Other Topics Concern   Not on file  Social History Narrative   Non-smoker      occ alcohol      Married-husband does the cooking      Works outside the home   Social Drivers of Corporate Investment Banker Strain: Low Risk  (10/01/2023)   Overall Financial Resource Strain (CARDIA)    Difficulty of Paying Living Expenses: Not hard at all  Food Insecurity: No Food Insecurity (10/01/2023)   Hunger Vital Sign    Worried  About Running Out of Food in the Last Year: Never true    Ran Out of Food in the Last Year: Never true  Transportation Needs: No Transportation Needs (10/01/2023)   PRAPARE - Administrator, Civil Service (Medical): No    Lack of Transportation (Non-Medical): No  Physical Activity: Inactive (10/01/2023)   Exercise Vital Sign    Days of Exercise per Week: 0 days    Minutes of Exercise per Session: 0 min  Stress: No Stress Concern Present (10/01/2023)   [employer] of Occupational Health - Occupational Stress Questionnaire    Feeling of Stress : Not at all  Social Connections: Socially Isolated (10/01/2023)   Social Connection and Isolation Panel [NHANES]    Frequency of Communication with Friends and Family: More than three times a week    Frequency of Social Gatherings with Friends and Family: More than three times a week    Attends Religious Services: Never    Database Administrator or Organizations: No    Attends Banker Meetings: Never    Marital Status: Widowed    Tobacco Counseling Counseling given: Not Answered   Clinical Intake:  Pre-visit preparation completed: Yes  Pain : No/denies pain     BMI - recorded: 27.07 Nutritional Status: BMI 25 -29 Overweight Nutritional Risks: None Diabetes: No  How often do you need to have someone help you when you read instructions, pamphlets, or other written materials from your doctor or pharmacy?: 1 - Never  Interpreter Needed?: No  Comments: lives alone Information entered by :: B.Elisandra Deshmukh,LPN   Activities of Daily Living    10/01/2023    2:06 PM  In your present state of health, do you have any difficulty performing the following activities:  Hearing? 0  Vision? 0  Difficulty concentrating or making decisions? 1  Comment little memory at times  Walking or climbing stairs? 0  Dressing or  bathing? 0  Doing errands, shopping? 0  Preparing Food and eating ? N  Using the Toilet? N  In the past  six months, have you accidently leaked urine? N  Do you have problems with loss of bowel control? N  Managing your Medications? N  Managing your Finances? N  Housekeeping or managing your Housekeeping? N    Patient Care Team: [name], Laine LABOR, MD as PCP - General Alline Lenis, MD (Inactive) as Consulting Physician (Urology) Jorizzo, Fairy CROME, MD as Referring Physician (Dermatology) Leslee Reusing, MD as Consulting Physician (Ophthalmology)  Indicate any recent Medical Services you may have received from other than [facility] providers in the past year (date may be approximate).     Assessment:   This is a routine wellness examination for Laryn.  Hearing/Vision screen Hearing Screening - Comments:: Pt says her hearing is good Vision Screening - Comments:: Pt says her vision is excellent even without glasses    Goals Addressed             This Visit's Progress    Increase physical activity   Not on track    Starting 11/15/2016, I continue to increase walking for an additional 20 minutes daily.      Patient Stated   On track    09/26/2020, I will maintain and continue medications as prescribed.      Patient Stated   On track    Would like to maintain current routine Stay active Healthy diet       Depression Screen    10/01/2023    2:02 PM 10/23/2022    2:03 PM 09/30/2022   11:37 AM 09/27/2021   12:13 PM 09/26/2020   12:03 PM 10/18/2019   10:34 AM 03/25/2019    9:26 AM  PHQ 2/9 Scores  PHQ - 2 Score 0 0 0 0 0 5 0  PHQ- 9 Score  0   0 13     Fall Risk    10/01/2023    1:51 PM 04/23/2023   12:24 PM 10/23/2022    2:03 PM 09/30/2022   11:40 AM 09/27/2021   12:10 PM  Fall Risk   Falls in the past year? 1 0 1 0 1  Number falls in past yr: 0 0 1 0 0  Injury with Fall? 0 0 1 0 0  Risk for fall due to : No Fall Risks No Fall Risks History of fall(s)  Other (Comment)  Risk for fall due to: Comment     fell helping son  Follow up Education provided;Falls prevention discussed Falls  evaluation completed Falls evaluation completed Falls evaluation completed;Falls prevention discussed Falls prevention discussed    MEDICARE RISK AT HOME: Medicare Risk at Home Any stairs in or around the home?: No If so, are there any without handrails?: No Home free of loose throw rugs in walkways, pet beds, electrical cords, etc?: Yes Adequate lighting in your home to reduce risk of falls?: Yes Life alert?: Yes Use of a cane, walker or w/c?: Yes (cane) Grab bars in the bathroom?: No Shower chair or bench in shower?: No Elevated toilet seat or a handicapped toilet?: No  TIMED UP AND GO:  Was the test performed?  No    Cognitive Function:    09/26/2020   12:07 PM 11/15/2016   11:41 AM 11/06/2015    8:39 AM  MMSE - Mini Mental State Exam  Orientation to time 5 5 5   Orientation to Place 5 5 5   Registration  3 3 3   Attention/ Calculation 5 0 5  Recall 0 3 3  Language- name 2 objects  0 0  Language- repeat 1 1 1   Language- follow 3 step command  3 3  Language- read & follow direction  0 1  Write a sentence  0 0  Copy design  0 0  Total score  20 26        10/01/2023    2:07 PM 09/30/2022   11:42 AM  6CIT Screen  What Year? 0 points 0 points  What month? 0 points 0 points  What time? 0 points 0 points  Count back from 20 0 points 0 points  Months in reverse 4 points 0 points  Repeat phrase 10 points 10 points  Total Score 14 points 10 points    Immunizations Immunization History  Administered Date(s) Administered   Fluad Quad(high Dose 65+) 05/16/2022   Influenza Split 05/11/2011   Influenza Whole 05/27/2007, 05/21/2008, 04/29/2009, 05/28/2012   Influenza, High Dose Seasonal PF 05/12/2014, 05/20/2016, 05/26/2017, 05/11/2020, 05/26/2021   Influenza,inj,Quad PF,6+ Mos 05/08/2018, 04/15/2019   Influenza-Unspecified 05/07/2013   PFIZER(Purple Top)SARS-COV-2 Vaccination 09/11/2019, 10/02/2019, 06/10/2020   Pfizer Covid-19 Vaccine Bivalent Booster 70yrs & up 08/13/2021    Pneumococcal Conjugate-13 10/25/2014   Pneumococcal Polysaccharide-23 08/27/2003   Td 12/26/2003   Tdap 11/07/2014   Zoster Recombinant(Shingrix) 04/15/2019, 06/15/2019   Zoster, Live 03/29/2008    TDAP status: Up to date  Flu Vaccine status: Up to date  Pneumococcal vaccine status: Up to date  Covid-19 vaccine status: Completed vaccines  Qualifies for Shingles Vaccine? Yes   Zostavax completed Yes   Shingrix Completed?: Yes  Screening Tests Health Maintenance  Topic Date Due   MAMMOGRAM  05/07/2023   INFLUENZA VACCINE  11/24/2023 (Originally 03/27/2023)   COVID-19 Vaccine (5 - 2024-25 season) 07/06/2024 (Originally 04/27/2023)   Medicare Annual Wellness (AWV)  09/30/2024   DTaP/Tdap/Td (3 - Td or Tdap) 11/06/2024   Pneumonia Vaccine 23+ Years old  Completed   DEXA SCAN  Completed   Zoster Vaccines- Shingrix  Completed   HPV VACCINES  Aged Out    Health Maintenance  Health Maintenance Due  Topic Date Due   MAMMOGRAM  05/07/2023    Colorectal cancer screening: No longer required.   Mammogram status: No longer required due to age.  Lung Cancer Screening: (Low Dose CT Chest recommended if Age 45-80 years, 20 pack-year currently smoking OR have quit w/in 15years.) does not qualify.   Lung Cancer Screening Referral: no  Additional Screening:  Hepatitis C Screening: does not qualify; Completed no  Vision Screening: Recommended annual ophthalmology exams for early detection of glaucoma and other disorders of the eye. Is the patient up to date with their annual eye exam?  Yes  Who is the provider or what is the name of the office in which the patient attends annual eye exams? Dr McCuen If pt is not established with a provider, would they like to be referred to a provider to establish care? No .   Dental Screening: Recommended annual dental exams for proper oral hygiene  Diabetic Foot Exam: n/a  Community Resource Referral / Chronic Care Management: CRR required this  visit?  No   CCM required this visit?  No    Plan:     I have personally reviewed and noted the following in the patient's chart:   Medical and social history Use of alcohol, tobacco or illicit drugs  Current medications and supplements including opioid  prescriptions. Patient is not currently taking opioid prescriptions. Functional ability and status Nutritional status Physical activity Advanced directives List of other physicians Hospitalizations, surgeries, and ER visits in previous 12 months Vitals Screenings to include cognitive, depression, and falls Referrals and appointments  In addition, I have reviewed and discussed with patient certain preventive protocols, quality metrics, and best practice recommendations. A written personalized care plan for preventive services as well as general preventive health recommendations were provided to patient.    Erminio LITTIE Saris, LPN   02/26/7973   After Visit Summary: (Declined) Due to this being a telephonic visit, with patients personalized plan was offered to patient but patient Declined AVS at this time   Nurse Notes: Pt says she experiencing itchy bumps all over her body: some red, white and black appearing since her hand surgery a couple of months ago. They says they have gradually worsened. She relays she has an appt w/dermatology next week.  She has no other concerns or questions.

## 2023-10-03 NOTE — Patient Instructions (Signed)
 Linda Mcgrath , Thank you for taking time to come for your Medicare Wellness Visit. I appreciate your ongoing commitment to your health goals. Please review the following plan we discussed and let me know if I can assist you in the future.   Referrals/Orders/Follow-Ups/Clinician Recommendations: none  This is a list of the screening recommended for you and due dates:  Health Maintenance  Topic Date Due   Mammogram  05/07/2023   Flu Shot  11/24/2023*   COVID-19 Vaccine (5 - 2024-25 season) 07/06/2024*   Medicare Annual Wellness Visit  09/30/2024   DTaP/Tdap/Td vaccine (3 - Td or Tdap) 11/06/2024   Pneumonia Vaccine  Completed   DEXA scan (bone density measurement)  Completed   Zoster (Shingles) Vaccine  Completed   HPV Vaccine  Aged Out  *Topic was postponed. The date shown is not the original due date.    Advanced directives: (Copy Requested) Please bring a copy of your health care power of attorney and living will to the office to be added to your chart at your convenience.  Next Medicare Annual Wellness Visit scheduled for next year: Yes 10/01/2024 @ 1:40pm

## 2023-10-06 ENCOUNTER — Encounter: Payer: Self-pay | Admitting: Dermatology

## 2023-10-06 ENCOUNTER — Ambulatory Visit: Payer: Medicare Other | Admitting: Dermatology

## 2023-10-06 VITALS — BP 124/79 | HR 68

## 2023-10-06 DIAGNOSIS — D492 Neoplasm of unspecified behavior of bone, soft tissue, and skin: Secondary | ICD-10-CM

## 2023-10-06 DIAGNOSIS — C44629 Squamous cell carcinoma of skin of left upper limb, including shoulder: Secondary | ICD-10-CM | POA: Diagnosis not present

## 2023-10-06 DIAGNOSIS — C44729 Squamous cell carcinoma of skin of left lower limb, including hip: Secondary | ICD-10-CM | POA: Diagnosis not present

## 2023-10-06 DIAGNOSIS — Z85828 Personal history of other malignant neoplasm of skin: Secondary | ICD-10-CM | POA: Diagnosis not present

## 2023-10-06 DIAGNOSIS — C44622 Squamous cell carcinoma of skin of right upper limb, including shoulder: Secondary | ICD-10-CM | POA: Diagnosis not present

## 2023-10-06 DIAGNOSIS — D485 Neoplasm of uncertain behavior of skin: Secondary | ICD-10-CM

## 2023-10-06 NOTE — Progress Notes (Signed)
 Follow Up Visit   Subjective  Linda Mcgrath is a 89 y.o. female who presents for the following: follow up from Mohs surgery   The patient presents for follow up from Mohs surgery for a SCC on the Left hand, treated on 06/16/23, repaired with FTSG. The patient has been bandaging the wound as directed. The endorse the following concerns: redness and rash.  She also mentions these new painful lesions that have come up on the last few weeks, not previously treated.   The following portions of the chart were reviewed this encounter and updated as appropriate: medications, allergies, medical history  Review of Systems:  No other skin or systemic complaints except as noted in HPI or Assessment and Plan.  Objective  Well appearing patient in no apparent distress; mood and affect are within normal limits.  A full examination was performed including scalp, head, face and Left hand. All findings within normal limits unless otherwise noted below.  Healing wound with mild erythema  Relevant physical exam findings are noted in the Assessment and Plan.      Assessment & Plan   Healing s/p [facility], treated on 06/16/23, repaired with FTSG - Reassured that wound is healing well - No evidence of infection - No swelling, induration, purulence, dehiscence, or tenderness out of proportion to the clinical exam, see photo above - Discussed that scars take up to 12 months to mature from the date of surgery - Recommend SPF 30+ to scar daily to prevent purple color from UV exposure during scar maturation process - Discussed that erythema and raised appearance of scar will fade over the next 4-6 months - OK to start scar massage at 4-6 weeks post-op - Can consider silicone based products for scar healing starting at 6 weeks post-op - Ok to discontinue ointment daily to wound.   Multiple New Neoplasms on Bilateral UE and LE- Ddx Eruptive Keratoacanthomas vs Prurigo Nodularis vs  other NEOPLASM OF UNCERTAIN BEHAVIOR OF SKIN (3) Left Lower Leg - lateral Skin / nail biopsy Type of biopsy: tangential   Informed consent: discussed and consent obtained   Timeout: patient name, date of birth, surgical site, and procedure verified   Procedure prep:  Patient was prepped and draped in usual sterile fashion Prep type:  Isopropyl alcohol Anesthesia: the lesion was anesthetized in a standard fashion   Anesthetic:  1% lidocaine  w/ epinephrine 1-100,000 buffered w/ 8.4% NaHCO3 Instrument used: DermaBlade   Hemostasis achieved with: aluminum chloride   Outcome: patient tolerated procedure well   Post-procedure details: sterile dressing applied and wound care instructions given   Dressing type: bandage and petrolatum    Specimen 1 - Surgical pathology Differential Diagnosis: R/o KA vs PN vs NMSC  Check Margins: No Left Forearm - Posterior Skin / nail biopsy Type of biopsy: tangential   Informed consent: discussed and consent obtained   Timeout: patient name, date of birth, surgical site, and procedure verified   Procedure prep:  Patient was prepped and draped in usual sterile fashion Prep type:  Isopropyl alcohol Anesthesia: the lesion was anesthetized in a standard fashion   Anesthetic:  1% lidocaine  w/ epinephrine 1-100,000 buffered w/ 8.4% NaHCO3 Instrument used: DermaBlade   Hemostasis achieved with: aluminum chloride   Outcome: patient tolerated procedure well   Post-procedure details: sterile dressing applied and wound care instructions given   Dressing type: petrolatum  and bandage   Specimen 2 - Surgical pathology Differential Diagnosis: R/o KA vs PN vs NMSC  Check  Margins: No Right Forearm - Posterior Skin / nail biopsy Type of biopsy: tangential   Informed consent: discussed and consent obtained   Timeout: patient name, date of birth, surgical site, and procedure verified   Procedure prep:  Patient was prepped and draped in usual sterile fashion Prep type:   Isopropyl alcohol Anesthesia: the lesion was anesthetized in a standard fashion   Anesthetic:  1% lidocaine  w/ epinephrine 1-100,000 buffered w/ 8.4% NaHCO3 Instrument used: DermaBlade   Hemostasis achieved with: aluminum chloride   Outcome: patient tolerated procedure well   Post-procedure details: sterile dressing applied and wound care instructions given   Dressing type: petrolatum  and bandage   Specimen 3 - Surgical pathology Differential Diagnosis: R/o KA vs PN vs NMSC  Check Margins: No  Return if symptoms worsen or fail to improve.  Phebe Brasil, Surg Tech III, am acting as scribe for Deneise Finlay, MD.   Documentation: I have reviewed the above documentation for accuracy and completeness, and I agree with the above.  Deneise Finlay, MD

## 2023-10-06 NOTE — Patient Instructions (Signed)
 Patient Handout: Wound Care for Skin Biopsy Site  Taking Care of Your Skin Biopsy Site  Proper care of the biopsy site is essential for promoting healing and minimizing scarring. This handout provides instructions on how to care for your biopsy site to ensure optimal recovery.  1. Cleaning the Wound:  Clean the biopsy site daily with gentle soap and water. Gently pat the area dry with a clean, soft towel. Avoid harsh scrubbing or rubbing the area, as this can irritate the skin and delay healing.  2. Applying Aquaphor and Bandage:  After cleaning the wound, apply a thin layer of Aquaphor ointment to the biopsy site. Cover the area with a sterile bandage to protect it from dirt, bacteria, and friction. Change the bandage daily or as needed if it becomes soiled or wet.  3. Continued Care for One Week:  Repeat the cleaning, Aquaphor application, and bandaging process daily for one week following the biopsy procedure. Keeping the wound clean and moist during this initial healing period will help prevent infection and promote optimal healing.  4. Massaging Aquaphor into the Area:  ---After one week, discontinue the use of bandages but continue to apply Aquaphor to the biopsy site. ----Gently massage the Aquaphor into the area using circular motions. ---Massaging the skin helps to promote circulation and prevent the formation of scar tissue.   Additional Tips:  Avoid exposing the biopsy site to direct sunlight during the healing process, as this can cause hyperpigmentation or worsen scarring. If you experience any signs of infection, such as increased redness, swelling, warmth, or drainage from the wound, contact your healthcare provider immediately. Follow any additional instructions provided by your healthcare provider for caring for the biopsy site and managing any discomfort. Conclusion:  Taking proper care of your skin biopsy site is crucial for ensuring optimal healing and  minimizing scarring. By following these instructions for cleaning, applying Aquaphor, and massaging the area, you can promote a smooth and successful recovery. If you have any questions or concerns about caring for your biopsy site, don't hesitate to contact your healthcare provider for guidance.   Important Information  Due to recent changes in healthcare laws, you may see results of your pathology and/or laboratory studies on MyChart before the doctors have had a chance to review them. We understand that in some cases there may be results that are confusing or concerning to you. Please understand that not all results are received at the same time and often the doctors may need to interpret multiple results in order to provide you with the best plan of care or course of treatment. Therefore, we ask that you please give [place] 2 business days to thoroughly review all your results before contacting the office for clarification. Should we see a critical lab result, you will be contacted sooner.   If You Need Anything After Your Visit  If you have any questions or concerns for your doctor, please call our main line at [phone] If no one answers, please leave a voicemail as directed and we will return your call as soon as possible. Messages left after 4 pm will be answered the following business day.   You may also send [place] a message via MyChart. We typically respond to MyChart messages within 1-2 business days.  For prescription refills, please ask your pharmacy to contact our office. Our fax number is [phone].  If you have an urgent issue when the clinic is closed that cannot wait until the next business day,  you can page your doctor at the number below.    Please note that while we do our best to be available for urgent issues outside of office hours, we are not available 24/7.   If you have an urgent issue and are unable to reach [place], you may choose to seek medical care at your doctor's office,  retail clinic, urgent care center, or emergency room.  If you have a medical emergency, please immediately call 911 or go to the emergency department. In the event of inclement weather, please call our main line at [phone] for an update on the status of any delays or closures.  Dermatology Medication Tips: Please keep the boxes that topical medications come in in order to help keep track of the instructions about where and how to use these. Pharmacies typically print the medication instructions only on the boxes and not directly on the medication tubes.   If your medication is too expensive, please contact our office at [phone] or send [place] a message through MyChart.   We are unable to tell what your co-pay for medications will be in advance as this is different depending on your insurance coverage. However, we may be able to find a substitute medication at lower cost or fill out paperwork to get insurance to cover a needed medication.   If a prior authorization is required to get your medication covered by your insurance company, please allow [place] 1-2 business days to complete this process.  Drug prices often vary depending on where the prescription is filled and some pharmacies may offer cheaper prices.  The website www.goodrx.com contains coupons for medications through different pharmacies. The prices here do not account for what the cost may be with help from insurance (it may be cheaper with your insurance), but the website can give you the price if you did not use any insurance.  - You can print the associated coupon and take it with your prescription to the pharmacy.  - You may also stop by our office during regular business hours and pick up a GoodRx coupon card.  - If you need your prescription sent electronically to a different pharmacy, notify our office through [facility] or by phone at [phone]    Skin Education :   I counseled the patient regarding the  following: Sun screen (SPF 30 or greater) should be applied during peak UV exposure (between 10am and 2pm) and reapplied after exercise or swimming.  The ABCDEs of melanoma were reviewed with the patient, and the importance of monthly self-examination of moles was emphasized. Should any moles change in shape or color, or itch, bleed or burn, pt will contact our office for evaluation sooner then their interval appointment.  Plan: Sunscreen Recommendations I recommended a broad spectrum sunscreen with a SPF of 30 or higher. I explained that SPF 30 sunscreens block approximately 97 percent of the sun's harmful rays. Sunscreens should be applied at least 15 minutes prior to expected sun exposure and then every 2 hours after that as long as sun exposure continues. If swimming or exercising sunscreen should be reapplied every 45 minutes to an hour after getting wet or sweating. One ounce, or the equivalent of a shot glass full of sunscreen, is adequate to protect the skin not covered by a bathing suit. I also recommended a lip balm with a sunscreen as well. Sun protective clothing can be used in lieu of sunscreen but must be worn the entire time you are exposed  to the sun's rays.

## 2023-10-07 ENCOUNTER — Ambulatory Visit: Payer: Medicare Other | Admitting: Dermatology

## 2023-10-08 LAB — SURGICAL PATHOLOGY

## 2023-10-09 DIAGNOSIS — Z23 Encounter for immunization: Secondary | ICD-10-CM | POA: Diagnosis not present

## 2023-10-10 DIAGNOSIS — R350 Frequency of micturition: Secondary | ICD-10-CM | POA: Diagnosis not present

## 2023-10-10 DIAGNOSIS — N3941 Urge incontinence: Secondary | ICD-10-CM | POA: Diagnosis not present

## 2023-10-13 ENCOUNTER — Telehealth: Payer: Self-pay

## 2023-10-13 NOTE — Telephone Encounter (Signed)
Spoke with pt gave her bx results and told her come in to discuss treatment

## 2023-10-13 NOTE — Telephone Encounter (Signed)
-----   Message from [facility] sent at 10/09/2023  4:11 PM EST ----- All are SCCs, we may need to see her ASAP to consider [place] 5FU  Please call patient to discuss diagnosis and treatment

## 2023-10-22 ENCOUNTER — Encounter: Payer: Self-pay | Admitting: Family Medicine

## 2023-10-22 ENCOUNTER — Ambulatory Visit (INDEPENDENT_AMBULATORY_CARE_PROVIDER_SITE_OTHER): Payer: Medicare Other | Admitting: Family Medicine

## 2023-10-22 VITALS — BP 118/68 | HR 70 | Temp 97.6°F | Ht 62.0 in | Wt 148.4 lb

## 2023-10-22 DIAGNOSIS — M810 Age-related osteoporosis without current pathological fracture: Secondary | ICD-10-CM

## 2023-10-22 DIAGNOSIS — C4492 Squamous cell carcinoma of skin, unspecified: Secondary | ICD-10-CM

## 2023-10-22 DIAGNOSIS — E039 Hypothyroidism, unspecified: Secondary | ICD-10-CM | POA: Diagnosis not present

## 2023-10-22 DIAGNOSIS — L570 Actinic keratosis: Secondary | ICD-10-CM

## 2023-10-22 DIAGNOSIS — C44629 Squamous cell carcinoma of skin of left upper limb, including shoulder: Secondary | ICD-10-CM | POA: Diagnosis not present

## 2023-10-22 DIAGNOSIS — I10 Essential (primary) hypertension: Secondary | ICD-10-CM

## 2023-10-22 LAB — T4, FREE: Free T4: 0.51 ng/dL — ABNORMAL LOW (ref 0.60–1.60)

## 2023-10-22 LAB — TSH: TSH: 35.47 u[IU]/mL — ABNORMAL HIGH (ref 0.35–5.50)

## 2023-10-22 MED ORDER — ALENDRONATE SODIUM 70 MG PO TABS: 70.0000 mg | ORAL_TABLET | ORAL | 2 refills | Status: DC

## 2023-10-22 MED ORDER — SOLIFENACIN SUCCINATE 10 MG PO TABS: 10.0000 mg | ORAL_TABLET | Freq: Every day | ORAL | 1 refills | Status: DC

## 2023-10-22 NOTE — Assessment & Plan Note (Signed)
 bp in fair control at this time  BP Readings from Last 1 Encounters:  10/22/23 118/68   No changes needed, controlled with lifestyle habits Most recent labs reviewed  Disc lifstyle change with low sodium diet and exercise

## 2023-10-22 NOTE — Assessment & Plan Note (Signed)
 This looks much improved and healed today

## 2023-10-22 NOTE — Assessment & Plan Note (Signed)
 On alendronate since 2023  Discussed fall prevention, supplements and exercise for bone density   Refilled today  Dexa 9/2o23

## 2023-10-22 NOTE — Progress Notes (Signed)
 Subjective:    Patient ID: Linda Mcgrath, female    DOB: 15-Apr-1933, 89 y.o.   MRN: [id number]  HPI  Wt Readings from Last 3 Encounters:  10/22/23 148 lb 6 oz (67.3 kg)  10/01/23 148 lb (67.1 kg)  09/18/23 148 lb 4 oz (67.2 kg)   27.14 kg/m  Vitals:   10/22/23 0924  BP: 118/68  Pulse: 70  Temp: 97.6 F (36.4 C)  SpO2: 97%    Pt presents for skin bumps/rash, HTN, thyroid  and chronic health problems   Has had recent derm visits for [facility] Sees Dr Caralyn Guile at [place] [facility] Last phone note noted need to return for 5 FU treatment  Next appointment is 3/11     Spots are very itchy now  Covering one on right leg with band aid  Using moisturizer lotion  No scent - ? Cetaphil or cerave   Has not used aquaphor   Hand is healed from Moh's     Hypothyroidism  Pt has no clinical changes No change in energy level/ hair or skin/ edema and no tremor Lab Results  Component Value Date   TSH 42.67 (H) 08/12/2023    Taking levothyroxine 50 mcg daily She had previously stopped levothyroxine and tsh went up  We started on 50    Vesicare-due for refill  Alendronate -due for refill No falls or fracture  Dexa 04/2022    bp is stable today  No cp or palpitations or headaches or edema  No medications/controlled with lifestyle habits  BP Readings from Last 3 Encounters:  10/22/23 118/68  10/06/23 124/79  09/18/23 120/78    Lab Results  Component Value Date   NA 139 04/23/2023   K 4.4 04/23/2023   CO2 28 04/23/2023   GLUCOSE 105 (H) 04/23/2023   BUN 10 04/23/2023   CREATININE 0.86 04/23/2023   CALCIUM 9.3 04/23/2023   GFR 60.47 04/23/2023   GFRNONAA 48 (L) 07/25/2017   Osteoporosis On alendronate since 2023  Dexa 04/2022       Patient Active Problem List   Diagnosis Date Noted   Cancer, skin, squamous cell 10/22/2023   Dark stools 07/07/2023   Chronic diarrhea 04/23/2023   Squamous cell cancer of skin of left hand 04/23/2023   Abnormal  urinalysis 03/10/2023   Actinic keratosis 03/10/2023   TMJ arthralgia 01/13/2023   Right shoulder pain 10/23/2022   Urinary frequency 03/26/2022   Chronic upper back pain 08/30/2020   Aortic atherosclerosis (HCC) 05/31/2020   Abnormal CT scan, small bowel 05/18/2020   Insomnia 12/15/2019   Chronic low back pain 10/18/2019   Osteoarthritis of right knee 08/02/2019   S/P lumbar spinal fusion 07/31/2017   H/O atrial flutter 03/06/2016   Obesity 11/06/2015   Urge incontinence 01/24/2015   Estrogen deficiency 10/25/2014   Lichen planus 12/20/2013   Prediabetes 06/03/2011   HYPERTENSION, BENIGN ESSENTIAL 10/23/2010   Osteoarthrosis, hand 01/24/2010   Hypothyroidism 08/24/2008   Vitamin D deficiency 06/06/2008   Hyperlipidemia 06/06/2008   MENOPAUSAL SYNDROME 03/29/2008   Osteoporosis 03/29/2008   Allergic rhinitis 01/25/2008   IBS 01/25/2008   Osteoarthritis 01/25/2008   FIBROMYALGIA 01/25/2008   Past Medical History:  Diagnosis Date   Allergic rhinitis    Arthritis    Asthma    Cataract    Bil/lens implant   Colon polyp 1999   small polyp   Diverticulosis    Dysrhythmia    Fibromyalgia    Hyperlipidemia    Hypothyroidism  Internal hemorrhoids    Leg cramps    Lichen planus    Lung nodule    stable LUL 9 mm   Menopausal syndrome    Osteoarthritis    UTI (urinary tract infection)    Past Surgical History:  Procedure Laterality Date   Abd U/S  11/1998   negative   Abd U/S  01/2001   gallbladder polyps   ABDOMINAL HYSTERECTOMY  1975   total-fibroids   APPENDECTOMY  1975   BREAST BIOPSY     benign   BREAST EXCISIONAL BIOPSY Left 1957   CARDIOVERSION N/A 03/07/2016   Procedure: CARDIOVERSION;  Surgeon: Yates Decamp, MD;  Location: [facility] ENDOSCOPY;  Service: Cardiovascular;  Laterality: N/A;   CATARACT EXTRACTION     bilateral   CHOLECYSTECTOMY     COLONOSCOPY  1998   Diverticulosis; polyp\   COLONOSCOPY  09/2002   Diverticulosis, hem   COLONOSCOPY  12/2007    diverticulosis, polyp   DEXA  10/2001   osteopenia   ELECTROPHYSIOLOGIC STUDY N/A 04/03/2016   Procedure: A-Flutter Ablation;  Surgeon: Will [name], MD;  Location: [facility] INVASIVE CV LAB;  Service: Cardiovascular;  Laterality: N/A;   ESOPHAGOGASTRODUODENOSCOPY  2004   [place] scan  01/2001   Negative   KNEE SURGERY     right knee / 11/2004   LAMINECTOMY WITH POSTERIOR LATERAL ARTHRODESIS LEVEL 2 N/A 07/31/2017   Procedure: DECOMPRESSIVE LAMINECTOMY LUMABR FOUR-FIVE, LUMBAR FIVE-SACRAL ONE;  Surgeon: Tia Alert, MD;  Location: [facility] OR;  Service: Neurosurgery;  Laterality: N/A;   laser surgery for glaucoma Left 09/21/2014   MULTIPLE TOOTH EXTRACTIONS     rentinal tear     ROTATOR CUFF REPAIR     x 2 /right shoulder   SKIN CANCER EXCISION     pre-melanoma / on face   TEE WITHOUT CARDIOVERSION N/A 03/07/2016   Procedure: TRANSESOPHAGEAL ECHOCARDIOGRAM (TEE);  Surgeon: Yates Decamp, MD;  Location: [facility] ENDOSCOPY;  Service: Cardiovascular;  Laterality: N/A;   TOE SURGERY     rt foot    Social History   Tobacco Use   Smoking status: Never   Smokeless tobacco: Never  Vaping Use   Vaping status: Never Used  Substance Use Topics   Alcohol use: No    Alcohol/week: 0.0 standard drinks of alcohol    Comment: occasional-rare   Drug use: No   Family History  Problem Relation Age of Onset   Parkinsonism Father    Colon cancer Brother    Allergies Mother    Heart disease Mother    Kidney failure Son        congenital  (kidney transplant)    Heart failure Son        died suddenly of CHF    Allergies  Allergen Reactions   Shellfish Allergy Anaphylaxis and Nausea And Vomiting   Tetracycline Hives, Nausea And Vomiting, Rash and Other (See Comments)    SYSTEMIC REACTION: heart palpitations,muscle spasms, vomiting   Amlodipine Besylate Other (See Comments)    REACTION: muscle spasms, extremity swelling, low bp   Ciprofloxacin Other (See Comments)    REACTION: muscle spasms, insomnia    Codeine Other (See Comments)    REACTION: hallucinations   Propoxyphene Hcl Other (See Comments)    Unknown   Sulfamethoxazole-Trimethoprim Nausea Only and Other (See Comments)    REACTION: dizzy, could not focus eyes and could not concentrate and nausea   Tape Other (See Comments)    BANDAIDS TAKE OFF HER SKIN; PLEASE USE  COBAN OR PAPER    Xarelto [Rivaroxaban] Other (See Comments)    Aches and pains and nervousness   Amoxicillin Rash   Other Swelling, Rash and Other (See Comments)    Has autoimmune disorder and spices erode her salivary glands and affects ankles and mouth DIRECTLY (takes off 1st layer of skin and leaves her unable to walk)   Penicillins Swelling, Rash and Other (See Comments)    Has patient had a PCN reaction causing immediate rash, facial/tongue/throat swelling, SOB or lightheadedness with hypotension: Yes Has patient had a PCN reaction causing severe rash involving mucus membranes or skin necrosis: No Has patient had a PCN reaction that required hospitalization No Has patient had a PCN reaction occurring within the last 10 years: No If all of the above answers are "NO", then may proceed with Cephalosporin use.    Current Outpatient Medications on File Prior to Visit  Medication Sig Dispense Refill   Ascorbic Acid (VITAMIN C PO) Take 500 mg by mouth daily.     B Complex Vitamins (VITAMIN B COMPLEX PO) Take 1 tablet by mouth daily.     B COMPLEX, FOLIC ACID, PO Take 666 mcg by mouth daily.     Biotin 10 MG TABS Take 1 tablet by mouth every morning.      Calcium Citrate-Vitamin D 250-200 MG-UNIT TABS Take 1 tablet by mouth daily.     cholestyramine (QUESTRAN) 4 g packet MIX AND DRINK 1 PACKET DAILY 90 each 1   Cranberry 180 MG CAPS Take 2 capsules by mouth daily.     cyclobenzaprine (FLEXERIL) 10 MG tablet Take 1 tablet (10 mg total) by mouth 3 (three) times daily as needed (pain, muscle spasms). 50 tablet 0   dextromethorphan-guaiFENesin (MUCINEX DM) 30-600 MG  12hr tablet Take 1 tablet by mouth 2 (two) times daily. 15 tablet 0   Fexofenadine HCl (MUCINEX ALLERGY PO) Take 1 tablet by mouth daily as needed (Allergies).      fluticasone (FLONASE) 50 MCG/ACT nasal spray Place 1 spray into both nostrils daily for 3 days. 16 g 0   folic acid (FOLVITE) 1 MG tablet Take 1 mg by mouth daily.     levothyroxine (SYNTHROID) 50 MCG tablet TAKE 1 TABLET BY MOUTH DAILY BEFORE BREAKFAST 90 tablet 1   MAGNESIUM-OXIDE PO Take 1 tablet by mouth daily.     Multiple Vitamin (MULTIVITAMIN) capsule Take 1 capsule by mouth daily.     mupirocin ointment (BACTROBAN) 2 % Apply 1 Application topically 2 (two) times daily. 30 g 2   Psyllium (METAMUCIL FIBER PO) Take by mouth at bedtime. 2 tablespoon     tiZANidine (ZANAFLEX) 4 MG tablet Take 1 tablet (4 mg total) by mouth at bedtime as needed for muscle spasms. 30 tablet 1   TURMERIC PO Take 1 capsule by mouth 2 (two) times daily.      vitamin B-12 (CYANOCOBALAMIN) 500 MCG tablet Take 500 mcg by mouth daily.     Vitamin D, Cholecalciferol, 50 MCG (2000 UT) CAPS Take 2 tablets by mouth daily.     No current facility-administered medications on file prior to visit.    Review of Systems  Constitutional:  Positive for fatigue.  Skin:  Positive for rash and wound.       Itching    Psychiatric/Behavioral:         Frustrated with health problems        Objective:   Physical Exam Constitutional:      General: She  is not in acute distress.    Appearance: Normal appearance. She is well-developed and normal weight. She is not ill-appearing.  HENT:     Head: Normocephalic and atraumatic.     Mouth/Throat:     Mouth: Mucous membranes are moist.     Pharynx: Oropharynx is clear.  Eyes:     Conjunctiva/sclera: Conjunctivae normal.     Pupils: Pupils are equal, round, and reactive to light.  Neck:     Thyroid: No thyromegaly.     Vascular: No carotid bruit or JVD.  Cardiovascular:     Rate and Rhythm: Normal rate and  regular rhythm.     Heart sounds: Normal heart sounds.     No gallop.  Pulmonary:     Effort: Pulmonary effort is normal. No respiratory distress.     Breath sounds: Normal breath sounds. No wheezing or rales.  Abdominal:     General: There is no distension or abdominal bruit.     Palpations: Abdomen is soft.  Musculoskeletal:     Cervical back: Normal range of motion and neck supple.     Right lower leg: No edema.     Left lower leg: No edema.     Comments: Mild kyphosis   Lymphadenopathy:     Cervical: No cervical adenopathy.  Skin:    General: Skin is warm and dry.     Coloration: Skin is not pale.     Findings: No rash.     Comments: Healing biopsy sites on arms and right leg  No signs of infection  Solar changes Sks and aks diffusely  Excoriations and picking marks on reachable areas of back (sparing the middle)  Arms and legs  Dry skin overall     Neurological:     Mental Status: She is alert.     Motor: No tremor.     Coordination: Coordination normal.     Deep Tendon Reflexes: Reflexes are normal and symmetric. Reflexes normal.  Psychiatric:        Mood and Affect: Mood normal.     Comments: Cognition is fair  Claims to not remember some things but overall sharp           Assessment & Plan:   Problem List Items Addressed This Visit       Cardiovascular and Mediastinum   HYPERTENSION, BENIGN ESSENTIAL   bp in fair control at this time  BP Readings from Last 1 Encounters:  10/22/23 118/68   No changes needed, controlled with lifestyle habits Most recent labs reviewed  Disc lifstyle change with low sodium diet and exercise          Endocrine   Hypothyroidism - Primary   Lab Results  Component Value Date   TSH 42.67 (H) 08/12/2023   This went up after holding levothyroxine We started back on 50 mcg daily in am  Re check today   Some dry skin and itching       Relevant Orders   TSH     Musculoskeletal and Integument   Squamous cell  cancer of skin of left hand   This looks much improved and healed today      Osteoporosis   On alendronate since 2023  Discussed fall prevention, supplements and exercise for bone density   Refilled today  Dexa 9/2o23       Relevant Medications   alendronate (FOSAMAX) 70 MG tablet   Cancer, skin, squamous cell   Some recent bx Per derm  note planning possible 5 FU treatment  Some itching  Excoriations noted on exam on areas she can reach  Encouraged use of aquaphor  Zyrtec for itch if needed  Keep nails short       Actinic keratosis   AKs and SCC Itchy  Dry   Encouraged use of aquaphor Sun protection  Follow up derm as planned 3/11  Reviewed last derm note

## 2023-10-22 NOTE — Assessment & Plan Note (Signed)
 Lab Results  Component Value Date   TSH 42.67 (H) 08/12/2023   This went up after holding levothyroxine We started back on 50 mcg daily in am  Re check today   Some dry skin and itching

## 2023-10-22 NOTE — Assessment & Plan Note (Signed)
 Some recent bx Per derm note planning possible 5 FU treatment  Some itching  Excoriations noted on exam on areas she can reach  Encouraged use of aquaphor  Zyrtec for itch if needed  Keep nails short

## 2023-10-22 NOTE — Assessment & Plan Note (Signed)
 AKs and SCC Itchy  Dry   Encouraged use of aquaphor Sun protection  Follow up derm as planned 3/11  Reviewed last derm note

## 2023-10-22 NOTE — Patient Instructions (Addendum)
 Get a aquaphor over the counter -use this on skin spots that itch  Also continue your scent free moisturizer  Avoid hot water and hot conditions-they make you itch more  Cut finger nails very sort to prevent damage from scratching   Get store brand of zyrtec (cetrizine) over the counter and take 10 mg every evening  This will help with itch   Get back to dermatology next month as planned so they can treat these spots   Today we will check thyroid  We will change dose of levothyroxine if needed  We will not refill it until we get your labs back   I sent in refills for vesicare and alendronate also

## 2023-10-23 ENCOUNTER — Telehealth: Payer: Self-pay | Admitting: *Deleted

## 2023-10-23 MED ORDER — LEVOTHYROXINE SODIUM 75 MCG PO TABS: 75.0000 ug | ORAL_TABLET | Freq: Every day | ORAL | 1 refills | Status: DC

## 2023-10-23 NOTE — Telephone Encounter (Signed)
-----   Message from [facility] sent at 10/22/2023  8:03 PM EST ----- You still need more levothyroxine  Please increase from 50 to 75 mcg daily  Please send in levothyroxine 75 mcg 1 po every day #30 1 refill  Re check tsh in 4-6 weeks please

## 2023-11-04 ENCOUNTER — Encounter: Payer: Self-pay | Admitting: Dermatology

## 2023-11-04 ENCOUNTER — Ambulatory Visit (INDEPENDENT_AMBULATORY_CARE_PROVIDER_SITE_OTHER): Payer: Medicare Other | Admitting: Dermatology

## 2023-11-04 VITALS — BP 145/82 | HR 73

## 2023-11-04 DIAGNOSIS — C44629 Squamous cell carcinoma of skin of left upper limb, including shoulder: Secondary | ICD-10-CM | POA: Diagnosis not present

## 2023-11-04 DIAGNOSIS — T1490XD Injury, unspecified, subsequent encounter: Secondary | ICD-10-CM

## 2023-11-04 DIAGNOSIS — D485 Neoplasm of uncertain behavior of skin: Secondary | ICD-10-CM

## 2023-11-04 DIAGNOSIS — C4492 Squamous cell carcinoma of skin, unspecified: Secondary | ICD-10-CM

## 2023-11-04 DIAGNOSIS — Z85828 Personal history of other malignant neoplasm of skin: Secondary | ICD-10-CM | POA: Diagnosis not present

## 2023-11-04 MED ORDER — TRIAMCINOLONE ACETONIDE 0.1 % EX CREA: 1.0000 | TOPICAL_CREAM | Freq: Every day | CUTANEOUS | 2 refills | Status: DC | PRN

## 2023-11-04 NOTE — Progress Notes (Signed)
   Follow-Up Visit   Subjective  Linda Mcgrath is a 89 y.o. female who presents for the following: Go over Biopsy results and treatment options.  The patient has spots, moles and lesions to be evaluated, some may be new or changing.  Patient is accompanied by a friend. Patient endorses developing more itchy lesions since her last appointment. She has tried several OTC topical meds with no improvement.  The following portions of the chart were reviewed this encounter and updated as appropriate: medications, allergies, medical history  Review of Systems:  No other skin or systemic complaints except as noted in HPI or Assessment and Plan.  Objective  Well appearing patient in no apparent distress; mood and affect are within normal limits.  A full examination was performed including scalp, head, eyes, ears, nose, lips, neck, chest, axillae, abdomen, back, buttocks, bilateral upper extremities, bilateral lower extremities, hands, feet, fingers, toes, fingernails, and toenails. All findings within normal limits unless otherwise noted below.   Relevant exam findings are noted in the Assessment and Plan.  Left Forearm - Posterior Diffuse pruritic hyperkeratotic papules  Assessment & Plan   Likely Metastatic Diffuse Squamous Cell Carcinoma of the skin The patient was counseled regarding the concern for metastatic squamous cell carcinoma (SCC) of the skin, given the continuing development of new pruritic lesions, some of which have been previously biopsied as well-differentiated SCCs. The importance of early intervention and close monitoring was emphasized, and a referral to oncology was discussed to explore a potential systemic work-up and to consider immunotherapy with cemiplimab, which may be an option if metastasis is confirmed. The patient's recent unintentional weight loss, as reported by their friend, was noted and will be further evaluated. Regarding symptomatic relief of current  lesions, the options of cryotherapy, intralesional 5-fluorouracil (5FU), acitretin, and topical steroids for itching were discussed. After considering the patient's current symptoms, it was decided to proceed with topical steroids for management of pruritus and inflammation of the lesions. The patient was encouraged to follow up promptly for further evaluation and to begin the referral process to oncology for comprehensive management. - Referral placed for medical oncology  Healing s/p Mohs for [facility], treated on 06/16/23, repaired with FTSG - Reassured that wound has healed - No evidence of infection - No swelling, induration, purulence, dehiscence, or tenderness out of proportion to the clinical exam, see photo above - Discussed that scars take up to 12 months to mature from the date of surgery - Recommend SPF 30+ to scar daily to prevent purple color from UV exposure during scar maturation process  SQUAMOUS CELL CARCINOMA OF SKIN Left Forearm - Posterior Referred to oncology for internal scans and treatment. Related Procedures Ambulatory referral to Hematology / Oncology Related Medications mupirocin ointment (BACTROBAN) 2 % Apply 1 Application topically 2 (two) times daily.  Return in about 2 months (around 01/04/2024).  I, Manual Meier, Surg Tech III, am acting as scribe for Gwenith Daily, MD.   We spent 45 min reviewing records, taking the patient history, providing face to face care with the patient, sending prescriptions.  Documentation: I have reviewed the above documentation for accuracy and completeness, and I agree with the above.  Gwenith Daily, MD

## 2023-11-04 NOTE — Patient Instructions (Signed)

## 2023-11-15 ENCOUNTER — Other Ambulatory Visit: Payer: Self-pay | Admitting: Family Medicine

## 2023-11-20 ENCOUNTER — Inpatient Hospital Stay: Attending: Oncology | Admitting: Oncology

## 2023-11-20 ENCOUNTER — Telehealth: Payer: Self-pay | Admitting: Oncology

## 2023-11-20 ENCOUNTER — Other Ambulatory Visit: Payer: Self-pay

## 2023-11-20 ENCOUNTER — Inpatient Hospital Stay

## 2023-11-20 VITALS — BP 145/70 | HR 83 | Temp 97.6°F | Resp 16 | Ht 62.0 in | Wt 149.4 lb

## 2023-11-20 DIAGNOSIS — C4492 Squamous cell carcinoma of skin, unspecified: Secondary | ICD-10-CM

## 2023-11-20 DIAGNOSIS — L299 Pruritus, unspecified: Secondary | ICD-10-CM

## 2023-11-20 DIAGNOSIS — Z800 Family history of malignant neoplasm of digestive organs: Secondary | ICD-10-CM | POA: Diagnosis not present

## 2023-11-20 DIAGNOSIS — Z79899 Other long term (current) drug therapy: Secondary | ICD-10-CM | POA: Diagnosis not present

## 2023-11-20 LAB — CBC WITH DIFFERENTIAL (CANCER CENTER ONLY)
Abs Immature Granulocytes: 0.02 10*3/uL (ref 0.00–0.07)
Basophils Absolute: 0.1 10*3/uL (ref 0.0–0.1)
Basophils Relative: 1 %
Eosinophils Absolute: 0.5 10*3/uL (ref 0.0–0.5)
Eosinophils Relative: 5 %
HCT: 44.4 % (ref 36.0–46.0)
Hemoglobin: 14.9 g/dL (ref 12.0–15.0)
Immature Granulocytes: 0 %
Lymphocytes Relative: 38 %
Lymphs Abs: 3.4 10*3/uL (ref 0.7–4.0)
MCH: 32.3 pg (ref 26.0–34.0)
MCHC: 33.6 g/dL (ref 30.0–36.0)
MCV: 96.3 fL (ref 80.0–100.0)
Monocytes Absolute: 0.6 10*3/uL (ref 0.1–1.0)
Monocytes Relative: 6 %
Neutro Abs: 4.3 10*3/uL (ref 1.7–7.7)
Neutrophils Relative %: 50 %
Platelet Count: 281 10*3/uL (ref 150–400)
RBC: 4.61 MIL/uL (ref 3.87–5.11)
RDW: 12.4 % (ref 11.5–15.5)
WBC Count: 8.8 10*3/uL (ref 4.0–10.5)
nRBC: 0 % (ref 0.0–0.2)

## 2023-11-20 LAB — CMP (CANCER CENTER ONLY)
ALT: 13 U/L (ref 0–44)
AST: 15 U/L (ref 15–41)
Albumin: 4.4 g/dL (ref 3.5–5.0)
Alkaline Phosphatase: 69 U/L (ref 38–126)
Anion gap: 5 (ref 5–15)
BUN: 19 mg/dL (ref 8–23)
CO2: 31 mmol/L (ref 22–32)
Calcium: 9.5 mg/dL (ref 8.9–10.3)
Chloride: 105 mmol/L (ref 98–111)
Creatinine: 1.02 mg/dL — ABNORMAL HIGH (ref 0.44–1.00)
GFR, Estimated: 53 mL/min — ABNORMAL LOW (ref >60)
Glucose, Bld: 92 mg/dL (ref 70–99)
Potassium: 4.1 mmol/L (ref 3.5–5.1)
Sodium: 141 mmol/L (ref 135–145)
Total Bilirubin: 0.8 mg/dL (ref 0.0–1.2)
Total Protein: 7.4 g/dL (ref 6.5–8.1)

## 2023-11-20 LAB — LACTATE DEHYDROGENASE: LDH: 144 U/L (ref 98–192)

## 2023-11-20 LAB — TSH: TSH: 25.865 u[IU]/mL — ABNORMAL HIGH (ref 0.350–4.500)

## 2023-11-20 LAB — MAGNESIUM: Magnesium: 2.2 mg/dL (ref 1.7–2.4)

## 2023-11-20 MED ORDER — ONDANSETRON HCL 8 MG PO TABS: 8.0000 mg | ORAL_TABLET | Freq: Three times a day (TID) | ORAL | 1 refills | Status: DC | PRN

## 2023-11-20 MED ORDER — PROCHLORPERAZINE MALEATE 10 MG PO TABS: 10.0000 mg | ORAL_TABLET | Freq: Four times a day (QID) | ORAL | 1 refills | Status: DC | PRN

## 2023-11-20 NOTE — Telephone Encounter (Signed)
 Linda Mcgrath

## 2023-11-20 NOTE — Progress Notes (Signed)
START ON PATHWAY REGIMEN - Melanoma and Other Skin Cancers     A cycle is every 21 days:     Cemiplimab-rwlc   **Always confirm dose/schedule in your pharmacy ordering system**  Patient Characteristics: Cutaneous Squamous Cell Carcinoma, Distant Metastases Disease Classification: Cutaneous Squamous Cell Carcinoma Therapeutic Status: Distant Metastases Click here if multiple primary tumors are present.: false  Intent of Therapy: Non-Curative / Palliative Intent, Discussed with Patient

## 2023-11-20 NOTE — Progress Notes (Unsigned)
 [facility]  ONCOLOGY CONSULT NOTE   PATIENT NAME: Linda Mcgrath   MR#: [id number] DOB: 1933-05-06  DATE OF SERVICE: 11/20/2023   REFERRING PHYSICIAN  Milda Smart, MD, Dermatology   Patient Care Team: [name], Audrie Gallus, MD as PCP - General Barron Alvine, MD (Inactive) as Consulting Physician (Urology) Reche Dixon Tandy Gaw, MD as Referring Physician (Dermatology) Maris Berger, MD as Consulting Physician (Ophthalmology) Paci, Daisey Must, MD (Dermatology)    CHIEF COMPLAINT/ PURPOSE OF CONSULTATION:   Recurrent squamous cell carcinoma of the skin.  ASSESSMENT & PLAN:   Linda Mcgrath is a 89 y.o. pleasant lady with a past medical history of dyslipidemia, hypothyroidism, colon polyps, was referred to our clinic for recurrent squamous of carcinoma of the skin.  Recurrent squamous cell carcinoma of skin Presents with multiple diffuse skin lesions over a three-month period, following a previous excision and skin graft for squamous cell carcinoma that extended to the bone.   Lesions are widespread, itchy, and confirmed as squamous cell carcinoma through biopsy on 10/06/2023, indicating diffuse spread. The goal is disease control and prevention of further spread.  Cemiplimab, an immunotherapy, is recommended, with a better tolerance profile than chemotherapy. Treatment is administered IV every three weeks indefinitely, with monitoring for immune-mediated side effects, particularly thyroid function.  We will order whole-body PET scan for baseline.  - Submit approval request for cemiplimab - Arrange education session on cemiplimab, including side effects and management - Administer cemiplimab IV every three weeks - Monitor thyroid function for immune-mediated side effects - Prescribe anti-nausea medication as a precaution - Perform baseline blood work to monitor electrolytes and blood counts  We will arrange treatment initiation in the next week or  2, pending above.  Pruritus, unspecified Experiences severe pruritus associated with skin lesions. Current topical treatments, including triamcinolone cream, provide limited relief. Oral antihistamines like Benadryl are not recommended due to drowsiness and fall risk. Zyrtec is recommended for its lower drowsiness risk. - Recommend over-the-counter Zyrtec for pruritus    I reviewed lab results and outside records for this visit and discussed relevant results with the patient. Diagnosis, plan of care and treatment options were also discussed in detail with the patient. Opportunity provided to ask questions and answers provided to her apparent satisfaction. Provided instructions to call our clinic with any problems, questions or concerns prior to return visit. I recommended to continue follow-up with PCP and sub-specialists. She verbalized understanding and agreed with the plan. No barriers to learning was detected.  NCCN guidelines have been consulted in the planning of this patient's care.  Meryl Crutch, MD   [facility] - A DEPT OF [facility] [address] [place] [address] Dept: [phone] Dept Fax: [phone]   HISTORY OF PRESENTING ILLNESS:   Discussed the use of AI scribe software for clinical note transcription with the patient, who gave verbal consent to proceed.   She was first diagnosed with squamous cell carcinoma of the skin on dorsal aspect of left hand in September 2024 and underwent Mohs surgery followed by full-thickness skin graft repair in October 2024.  This healed well.  Later on she was found to have multiple skin lesions scattered all across her extremities, torso, which were felt to be squamous cell carcinomas.  She did have biopsies on 10/06/2023 from left lower leg, left forearm and right forearm all of which showed well-differentiated squamous cell carcinomas.  At this  point, her  dermatologist Dr. Caralyn Guile sent a referral to [place] for consideration of systemic treatment with cemiplimab given recurrent squamous cell carcinoma of the skin.  Given the multiple number of skin lesions, with pruritus, plan is to proceed with systemic treatments using cemiplimab, pending authorization.  We did submit request for baseline whole-body PET scan.  Oncology History  Recurrent squamous cell carcinoma of skin  10/22/2023 Initial Diagnosis   Recurrent squamous cell carcinoma of skin   11/27/2023 -  Chemotherapy   Patient is on Treatment Plan : ADVANCED CUTANEOUS SQUAMOUS CELL CARCINOMA Cemiplimab q21d       INTERVAL HISTORY:  She presents with multiple skin lesions all over her body. The lesions are itchy and have been increasing in number over the past three months. The patient first noticed the lesions after a previous skin cancer was removed from her hand. The removal required a skin graft and the wound was dressed for about three months. After the wound healed, the patient noticed the new lesions. The patient has tried various creams, including a prescribed steroid cream, but the itching persists. The patient denies any other new symptoms.  MEDICAL HISTORY:  Past Medical History:  Diagnosis Date   Allergic rhinitis    Arthritis    Asthma    Cataract    Bil/lens implant   Colon polyp 1999   small polyp   Diverticulosis    Dysrhythmia    Fibromyalgia    Hyperlipidemia    Hypothyroidism    Internal hemorrhoids    Leg cramps    Lichen planus    Lung nodule    stable LUL 9 mm   Menopausal syndrome    Osteoarthritis    Squamous cell carcinoma of skin    UTI (urinary tract infection)     SURGICAL HISTORY: Past Surgical History:  Procedure Laterality Date   Abd U/S  11/1998   negative   Abd U/S  01/2001   gallbladder polyps   ABDOMINAL HYSTERECTOMY  1975   total-fibroids   APPENDECTOMY  1975   BREAST BIOPSY     benign   BREAST EXCISIONAL BIOPSY Left 1957    CARDIOVERSION N/A 03/07/2016   Procedure: CARDIOVERSION;  Surgeon: Yates Decamp, MD;  Location[facility] ENDOSCOPY;  Service: Cardiovascular;  Laterality: N/A;   CATARACT EXTRACTION     bilateral   CHOLECYSTECTOMY     COLONOSCOPY  1998   Diverticulosis; polyp\   COLONOSCOPY  09/2002   Diverticulosis, hem   COLONOSCOPY  12/2007   diverticulosis, polyp   DEXA  10/2001   osteopenia   ELECTROPHYSIOLOGIC STUDY N/A 04/03/2016   Procedure: A-Flutter Ablation;  Surgeon: Will [name], MD;  Location: [facility] INVASIVE CV LAB;  Service: Cardiovascular;  Laterality: N/A;   ESOPHAGOGASTRODUODENOSCOPY  2004   Hida scan  01/2001   Negative   KNEE SURGERY     right knee / 11/2004   LAMINECTOMY WITH POSTERIOR LATERAL ARTHRODESIS LEVEL 2 N/A 07/31/2017   Procedure: DECOMPRESSIVE LAMINECTOMY LUMABR FOUR-FIVE, LUMBAR FIVE-SACRAL ONE;  Surgeon: Tia Alert, MD;  Location: [facility] OR;  Service: Neurosurgery;  Laterality: N/A;   laser surgery for glaucoma Left 09/21/2014   MULTIPLE TOOTH EXTRACTIONS     rentinal tear     ROTATOR CUFF REPAIR     x 2 /right shoulder   SKIN CANCER EXCISION     pre-melanoma / on face   TEE WITHOUT CARDIOVERSION N/A 03/07/2016   Procedure: TRANSESOPHAGEAL ECHOCARDIOGRAM (TEE);  Surgeon: Yates Decamp, MD;  Location: [facility] ENDOSCOPY;  Service: Cardiovascular;  Laterality: N/A;   TOE SURGERY     rt foot     SOCIAL HISTORY: Social History   Socioeconomic History   Marital status: Widowed    Spouse name: Not on file   Number of children: 2   Years of education: Not on file   Highest education level: Not on file  Occupational History   Occupation: TRAVEL AGENT    Employer: [employer]  Tobacco Use   Smoking status: Never   Smokeless tobacco: Never  Vaping Use   Vaping status: Never Used  Substance and Sexual Activity   Alcohol use: No    Alcohol/week: 0.0 standard drinks of alcohol    Comment: occasional-rare   Drug use: No   Sexual activity: Never  Other Topics Concern    Not on file  Social History Narrative   Non-smoker      occ alcohol      Married-husband does the cooking      Works outside the home   Social Drivers of Corporate investment banker Strain: Low Risk  (10/01/2023)   Overall Financial Resource Strain (CARDIA)    Difficulty of Paying Living Expenses: Not hard at all  Food Insecurity: No Food Insecurity (10/01/2023)   Hunger Vital Sign    Worried About Running Out of Food in the Last Year: Never true    Ran Out of Food in the Last Year: Never true  Transportation Needs: No Transportation Needs (10/01/2023)   PRAPARE - Administrator, Civil Service (Medical): No    Lack of Transportation (Non-Medical): No  Physical Activity: Inactive (10/01/2023)   Exercise Vital Sign    Days of Exercise per Week: 0 days    Minutes of Exercise per Session: 0 min  Stress: No Stress Concern Present (10/01/2023)   [employer] of Occupational Health - Occupational Stress Questionnaire    Feeling of Stress : Not at all  Social Connections: Socially Isolated (10/01/2023)   Social Connection and Isolation Panel [NHANES]    Frequency of Communication with Friends and Family: More than three times a week    Frequency of Social Gatherings with Friends and Family: More than three times a week    Attends Religious Services: Never    Database administrator or Organizations: No    Attends Banker Meetings: Never    Marital Status: Widowed  Intimate Partner Violence: Not At Risk (10/01/2023)   Humiliation, Afraid, Rape, and Kick questionnaire    Fear of Current or Ex-Partner: No    Emotionally Abused: No    Physically Abused: No    Sexually Abused: No    FAMILY HISTORY: Family History  Problem Relation Age of Onset   Parkinsonism Father    Colon cancer Brother    Allergies Mother    Heart disease Mother    Kidney failure Son        congenital  (kidney transplant)    Heart failure Son        died suddenly of CHF     ALLERGIES:   She is allergic to shellfish allergy, tetracycline, amlodipine besylate, ciprofloxacin, codeine, propoxyphene hcl, sulfamethoxazole-trimethoprim, tape, xarelto [rivaroxaban], amoxicillin, other, and penicillins.  MEDICATIONS:  Current Outpatient Medications  Medication Sig Dispense Refill   fluticasone (FLONASE) 50 MCG/ACT nasal spray Place 1 spray into both nostrils daily for 3 days. 16 g 0   levothyroxine (SYNTHROID) 75 MCG tablet Take 1 tablet (75 mcg  total) by mouth daily before breakfast. 30 tablet 1   Psyllium (METAMUCIL FIBER PO) Take by mouth at bedtime. 2 tablespoon     solifenacin (VESICARE) 10 MG tablet Take 1 tablet (10 mg total) by mouth daily. 90 tablet 1   triamcinolone cream (KENALOG) 0.1 % Apply 1 Application topically daily as needed. 453 g 2   alendronate (FOSAMAX) 70 MG tablet Take 1 tablet (70 mg total) by mouth once a week. Take with a full glass of water on an empty stomach. (Patient not taking: Reported on 11/20/2023) 12 tablet 2   Ascorbic Acid (VITAMIN C PO) Take 500 mg by mouth daily. (Patient not taking: Reported on 11/20/2023)     B Complex Vitamins (VITAMIN B COMPLEX PO) Take 1 tablet by mouth daily. (Patient not taking: Reported on 11/20/2023)     B COMPLEX, FOLIC ACID, PO Take 666 mcg by mouth daily. (Patient not taking: Reported on 11/20/2023)     Biotin 10 MG TABS Take 1 tablet by mouth every morning.  (Patient not taking: Reported on 11/20/2023)     Calcium Citrate-Vitamin D 250-200 MG-UNIT TABS Take 1 tablet by mouth daily. (Patient not taking: Reported on 11/20/2023)     cholestyramine (QUESTRAN) 4 g packet MIX AND DRINK 1 PACKET DAILY (Patient not taking: Reported on 11/20/2023) 90 each 1   Cranberry 180 MG CAPS Take 2 capsules by mouth daily. (Patient not taking: Reported on 11/20/2023)     cyclobenzaprine (FLEXERIL) 10 MG tablet Take 1 tablet (10 mg total) by mouth 3 (three) times daily as needed (pain, muscle spasms). (Patient not taking: Reported on 11/20/2023) 50  tablet 0   dextromethorphan-guaiFENesin (MUCINEX DM) 30-600 MG 12hr tablet Take 1 tablet by mouth 2 (two) times daily. (Patient not taking: Reported on 11/20/2023) 15 tablet 0   Fexofenadine HCl (MUCINEX ALLERGY PO) Take 1 tablet by mouth daily as needed (Allergies).  (Patient not taking: Reported on 11/20/2023)     folic acid (FOLVITE) 1 MG tablet Take 1 mg by mouth daily. (Patient not taking: Reported on 11/20/2023)     GEMTESA 75 MG TABS Take 1 tablet by mouth daily. (Patient not taking: Reported on 11/20/2023)     MAGNESIUM-OXIDE PO Take 1 tablet by mouth daily. (Patient not taking: Reported on 11/20/2023)     Multiple Vitamin (MULTIVITAMIN) capsule Take 1 capsule by mouth daily. (Patient not taking: Reported on 11/20/2023)     ondansetron (ZOFRAN) 8 MG tablet Take 1 tablet (8 mg total) by mouth every 8 (eight) hours as needed for nausea or vomiting. 30 tablet 1   prochlorperazine (COMPAZINE) 10 MG tablet Take 1 tablet (10 mg total) by mouth every 6 (six) hours as needed for nausea or vomiting. 30 tablet 1   tiZANidine (ZANAFLEX) 4 MG tablet Take 1 tablet (4 mg total) by mouth at bedtime as needed for muscle spasms. (Patient not taking: Reported on 11/20/2023) 30 tablet 1   TURMERIC PO Take 1 capsule by mouth 2 (two) times daily.  (Patient not taking: Reported on 11/20/2023)     vitamin B-12 (CYANOCOBALAMIN) 500 MCG tablet Take 500 mcg by mouth daily. (Patient not taking: Reported on 11/20/2023)     Vitamin D, Cholecalciferol, 50 MCG (2000 UT) CAPS Take 2 tablets by mouth daily. (Patient not taking: Reported on 11/20/2023)     No current facility-administered medications for this visit.    REVIEW OF SYSTEMS:    Review of Systems - Oncology  All other pertinent systems were reviewed  with the patient and are negative.  PHYSICAL EXAMINATION:    Onc Performance Status - 11/20/23 1324       ECOG Perf Status   ECOG Perf Status Restricted in physically strenuous activity but ambulatory and able to  carry out work of a light or sedentary nature, e.g., light house work, office work      KPS SCALE   KPS % SCORE Able to carry on normal activity, minor s/s of disease             Vitals:   11/20/23 1307  BP: (!) 145/70  Pulse: 83  Resp: 16  Temp: 97.6 F (36.4 C)  SpO2: 100%   Filed Weights   11/20/23 1307  Weight: 149 lb 6.4 oz (67.8 kg)    Physical Exam Constitutional:      General: She is not in acute distress.    Appearance: Normal appearance.  HENT:     Head: Normocephalic and atraumatic.  Eyes:     General: No scleral icterus.    Conjunctiva/sclera: Conjunctivae normal.  Cardiovascular:     Rate and Rhythm: Normal rate and regular rhythm.     Heart sounds: Normal heart sounds.  Pulmonary:     Effort: Pulmonary effort is normal.     Breath sounds: Normal breath sounds.  Abdominal:     General: There is no distension.  Musculoskeletal:     Right lower leg: No edema.     Left lower leg: No edema.  Skin:    Comments: Multiple skin lesions throughout extremities, back and torso, consistent with squamous cell skin cancers.  Some with excoriations.  Neurological:     General: No focal deficit present.     Mental Status: She is alert and oriented to person, place, and time.  Psychiatric:        Mood and Affect: Mood normal.        Behavior: Behavior normal.        Thought Content: Thought content normal.       LABORATORY DATA:   I have reviewed the data as listed.  Results for orders placed or performed in visit on 11/20/23  Magnesium  Result Value Ref Range   Magnesium 2.2 1.7 - 2.4 mg/dL  Lactate dehydrogenase  Result Value Ref Range   LDH 144 98 - 192 U/L  TSH  Result Value Ref Range   TSH 25.865 (H) 0.350 - 4.500 uIU/mL  CMP (Cancer Center only)  Result Value Ref Range   Sodium 141 135 - 145 mmol/L   Potassium 4.1 3.5 - 5.1 mmol/L   Chloride 105 98 - 111 mmol/L   CO2 31 22 - 32 mmol/L   Glucose, Bld 92 70 - 99 mg/dL   BUN 19 8 - 23  mg/dL   Creatinine 1.91 (H) 4.78 - 1.00 mg/dL   Calcium 9.5 8.9 - 29.5 mg/dL   Total Protein 7.4 6.5 - 8.1 g/dL   Albumin 4.4 3.5 - 5.0 g/dL   AST 15 15 - 41 U/L   ALT 13 0 - 44 U/L   Alkaline Phosphatase 69 38 - 126 U/L   Total Bilirubin 0.8 0.0 - 1.2 mg/dL   GFR, Estimated 53 (L) >60 mL/min   Anion gap 5 5 - 15  CBC with Differential (Cancer Center Only)  Result Value Ref Range   WBC Count 8.8 4.0 - 10.5 K/uL   RBC 4.61 3.87 - 5.11 MIL/uL   Hemoglobin 14.9 12.0 - 15.0 g/dL  HCT 44.4 36.0 - 46.0 %   MCV 96.3 80.0 - 100.0 fL   MCH 32.3 26.0 - 34.0 pg   MCHC 33.6 30.0 - 36.0 g/dL   RDW 53.6 64.4 - 03.4 %   Platelet Count 281 150 - 400 K/uL   nRBC 0.0 0.0 - 0.2 %   Neutrophils Relative % 50 %   Neutro Abs 4.3 1.7 - 7.7 K/uL   Lymphocytes Relative 38 %   Lymphs Abs 3.4 0.7 - 4.0 K/uL   Monocytes Relative 6 %   Monocytes Absolute 0.6 0.1 - 1.0 K/uL   Eosinophils Relative 5 %   Eosinophils Absolute 0.5 0.0 - 0.5 K/uL   Basophils Relative 1 %   Basophils Absolute 0.1 0.0 - 0.1 K/uL   Immature Granulocytes 0 %   Abs Immature Granulocytes 0.02 0.00 - 0.07 K/uL      RADIOGRAPHIC STUDIES:  I have personally reviewed the radiological images as listed and agree with the findings in the report.  No results found.  Orders Placed This Encounter  Procedures   Consent Attestation for Oncology Treatment    The patient is informed of risks, benefits, side-effects of the prescribed oncology treatment. Potential short term and long term side effects and response rates discussed. After a long discussion, the patient made informed decision to proceed.:   Yes   NM PET Image Initial (PI) Whole Body    Standing Status:   Future    Expected Date:   11/25/2023    Expiration Date:   11/19/2024    If indicated for the ordered procedure, I authorize the administration of a radiopharmaceutical per Radiology protocol:   Yes    Preferred imaging location?:   [place]   CBC with Differential  (Cancer Center Only)    Standing Status:   Future    Number of Occurrences:   1    Expiration Date:   11/19/2024   CMP (Cancer Center only)    Standing Status:   Future    Number of Occurrences:   1    Expiration Date:   11/19/2024   TSH    Standing Status:   Future    Number of Occurrences:   1    Expiration Date:   11/19/2024   Lactate dehydrogenase    Standing Status:   Future    Number of Occurrences:   1    Expiration Date:   11/19/2024   Magnesium    Standing Status:   Future    Number of Occurrences:   1    Expiration Date:   11/19/2024   CBC with Differential (Cancer Center Only)    Standing Status:   Future    Expected Date:   11/27/2023    Expiration Date:   11/26/2024   CMP (Cancer Center only)    Standing Status:   Future    Expected Date:   11/27/2023    Expiration Date:   11/26/2024   T4    Standing Status:   Future    Expected Date:   11/27/2023    Expiration Date:   11/26/2024   TSH    Standing Status:   Future    Expected Date:   11/27/2023    Expiration Date:   11/26/2024   CBC with Differential (Cancer Center Only)    Standing Status:   Future    Expected Date:   12/18/2023    Expiration Date:   12/17/2024   [facility]    Standing Status:   Future    Expected Date:   12/18/2023    Expiration Date:   12/17/2024   CBC with Differential (Cancer Center Only)    Standing Status:   Future    Expected Date:   01/08/2024    Expiration Date:   01/07/2025   CMP (Cancer Center only)    Standing Status:   Future    Expected Date:   01/08/2024    Expiration Date:   01/07/2025   T4    Standing Status:   Future    Expected Date:   01/08/2024    Expiration Date:   01/07/2025   TSH    Standing Status:   Future    Expected Date:   01/08/2024    Expiration Date:   01/07/2025   CBC with Differential (Cancer Center Only)    Standing Status:   Future    Expected Date:   01/29/2024    Expiration Date:   01/28/2025   CMP (Cancer Center only)    Standing Status:   Future     Expected Date:   01/29/2024    Expiration Date:   01/28/2025   CBC with Differential (Cancer Center Only)    Standing Status:   Future    Expected Date:   02/19/2024    Expiration Date:   02/18/2025   CMP (Cancer Center only)    Standing Status:   Future    Expected Date:   02/19/2024    Expiration Date:   02/18/2025   CBC with Differential (Cancer Center Only)    Standing Status:   Future    Expected Date:   03/11/2024    Expiration Date:   03/11/2025   CMP (Cancer Center only)    Standing Status:   Future    Expected Date:   03/11/2024    Expiration Date:   03/11/2025   T4    Standing Status:   Future    Expected Date:   03/11/2024    Expiration Date:   03/11/2025   TSH    Standing Status:   Future    Expected Date:   03/11/2024    Expiration Date:   03/11/2025   PHYSICIAN COMMUNICATION ORDER    Required labs: Thyroid function tests at baseline and every 3rd cycle.   ONCBCN PHYSICIAN COMMUNICATION 1    For pathway MELOS101: Treatment Until Progression or Unacceptable Toxicity or up to 93 Weeks, MELOS89: Until Progression or Unacceptable Toxicity.    CODE STATUS:  Code Status History     Date Active Date Inactive Code Status Order ID Comments User Context   07/31/2017 1836 08/05/2017 1524 Full Code [id number]  Tia Alert, MD Inpatient   04/03/2016 1258 04/03/2016 2124 Full Code [id number]  Regan Lemming, MD Inpatient   03/17/2016 1726 03/18/2016 1457 Full Code [id number]  Marcy Salvo, NP ED   03/06/2016 1903 03/08/2016 1529 Full Code [id number]  Marcy Salvo, NP ED       Future Appointments  Date Time Provider Department Center  11/25/2023 11:00 AM [facility] CHEMO EDU [facility] None  11/27/2023 11:00 AM [facility] PET CT 1 [facility] [place]  11/27/2023  2:00 PM [facility] LAB [facility] None  11/27/2023  2:30 PM Unnamed Zeien, MD [facility] None  11/27/2023  3:15 PM [facility] INFUSION [facility] None  12/02/2023  9:45 AM [facility] LAB [facility]  12/18/2023  2:00 PM  [facility] LAB [facility] None  12/18/2023  2:30 PM Meloni Hinz,  MD [facility] None  12/18/2023  3:30 PM [facility] INFUSION [facility] None  01/06/2024  2:30 PM Paci, Daisey Must, MD [facility] None  01/08/2024 11:00 AM [facility] LAB [facility] None  01/08/2024 11:30 AM Keighan Amezcua, MD [facility] None  01/08/2024 12:30 PM [facility] INFUSION [facility] None  01/29/2024 10:00 AM [facility] LAB [facility] None  01/29/2024 10:30 AM Roxy Filler, MD [facility] None  01/29/2024 11:15 AM [facility] INFUSION [facility] None  10/01/2024  1:40 PM [facility] ANNUAL WELLNESS VISIT 1 [facility] PEC     I spent a total of 60 minutes during this encounter with the patient including review of chart and various tests results, discussions about plan of care and coordination of care plan.  This document was completed utilizing speech recognition software. Grammatical errors, random word insertions, pronoun errors, and incomplete sentences are an occasional consequence of this system due to software limitations, ambient noise, and hardware issues. Any formal questions or concerns about the content, text or information contained within the body of this dictation should be directly addressed to the provider for clarification.

## 2023-11-20 NOTE — Telephone Encounter (Signed)
 Completed - Patient is aware of appointment details for education appointment.

## 2023-11-21 ENCOUNTER — Other Ambulatory Visit: Payer: Self-pay

## 2023-11-21 ENCOUNTER — Encounter: Payer: Self-pay | Admitting: Oncology

## 2023-11-21 NOTE — Assessment & Plan Note (Signed)
 Experiences severe pruritus associated with skin lesions. Current topical treatments, including triamcinolone cream, provide limited relief. Oral antihistamines like Benadryl are not recommended due to drowsiness and fall risk. Zyrtec is recommended for its lower drowsiness risk. - Recommend over-the-counter Zyrtec for pruritus

## 2023-11-21 NOTE — Assessment & Plan Note (Signed)
 Presents with multiple diffuse skin lesions over a three-month period, following a previous excision and skin graft for squamous cell carcinoma that extended to the bone.   Lesions are widespread, itchy, and confirmed as squamous cell carcinoma through biopsy on 10/06/2023, indicating diffuse spread. The goal is disease control and prevention of further spread.  Cemiplimab, an immunotherapy, is recommended, with a better tolerance profile than chemotherapy. Treatment is administered IV every three weeks indefinitely, with monitoring for immune-mediated side effects, particularly thyroid function.  We will order whole-body PET scan for baseline.  - Submit approval request for cemiplimab - Arrange education session on cemiplimab, including side effects and management - Administer cemiplimab IV every three weeks - Monitor thyroid function for immune-mediated side effects - Prescribe anti-nausea medication as a precaution - Perform baseline blood work to monitor electrolytes and blood counts  We will arrange treatment initiation in the next week or 2, pending above.

## 2023-11-25 ENCOUNTER — Inpatient Hospital Stay: Attending: Oncology

## 2023-11-25 ENCOUNTER — Telehealth: Payer: Self-pay | Admitting: Oncology

## 2023-11-25 DIAGNOSIS — L299 Pruritus, unspecified: Secondary | ICD-10-CM | POA: Insufficient documentation

## 2023-11-25 DIAGNOSIS — C4492 Squamous cell carcinoma of skin, unspecified: Secondary | ICD-10-CM | POA: Insufficient documentation

## 2023-11-25 DIAGNOSIS — E039 Hypothyroidism, unspecified: Secondary | ICD-10-CM | POA: Insufficient documentation

## 2023-11-25 DIAGNOSIS — Z5112 Encounter for antineoplastic immunotherapy: Secondary | ICD-10-CM | POA: Insufficient documentation

## 2023-11-25 DIAGNOSIS — Z79620 Long term (current) use of immunosuppressive biologic: Secondary | ICD-10-CM | POA: Insufficient documentation

## 2023-11-25 DIAGNOSIS — Z79890 Hormone replacement therapy (postmenopausal): Secondary | ICD-10-CM | POA: Insufficient documentation

## 2023-11-25 NOTE — Telephone Encounter (Signed)
 Patient rescheduled education appointment for a later time today.

## 2023-11-26 ENCOUNTER — Encounter: Payer: Self-pay | Admitting: Oncology

## 2023-11-27 ENCOUNTER — Encounter (HOSPITAL_COMMUNITY)

## 2023-11-27 ENCOUNTER — Telehealth: Payer: Self-pay | Admitting: Oncology

## 2023-11-27 ENCOUNTER — Inpatient Hospital Stay

## 2023-11-27 ENCOUNTER — Inpatient Hospital Stay: Admitting: Oncology

## 2023-11-27 NOTE — Telephone Encounter (Signed)
 Linda Mcgrath called in to cancel Linda Mcgrath's appointments. Linda Mcgrath stated that Linda Mcgrath's son had to have an emergency surgery and that she cannot convince her to come in.

## 2023-12-01 ENCOUNTER — Telehealth: Payer: Self-pay | Admitting: Oncology

## 2023-12-01 NOTE — Telephone Encounter (Signed)
 Linda Mcgrath stated that she will call me back when [facility] (her transportation)  is available.

## 2023-12-01 NOTE — Telephone Encounter (Signed)
 Linda Mcgrath stated that she has to talk to [facility] to coordinate her transportation. I mentioned our transportation services, [name] stated that she will have [name] call my direct line when she visits her on today so that we may schedule more appointments.

## 2023-12-02 ENCOUNTER — Other Ambulatory Visit (INDEPENDENT_AMBULATORY_CARE_PROVIDER_SITE_OTHER): Payer: Medicare Other

## 2023-12-02 DIAGNOSIS — E039 Hypothyroidism, unspecified: Secondary | ICD-10-CM | POA: Diagnosis not present

## 2023-12-02 LAB — TSH: TSH: 12.05 u[IU]/mL — ABNORMAL HIGH (ref 0.35–5.50)

## 2023-12-02 NOTE — Addendum Note (Signed)
 Addended by: Roxy Manns A on: 12/02/2023 08:06 PM   Modules accepted: Orders

## 2023-12-04 ENCOUNTER — Telehealth: Payer: Self-pay | Admitting: Oncology

## 2023-12-04 NOTE — Telephone Encounter (Signed)
 I contacted Conley Simmonds per Heriberto Antigua request

## 2023-12-04 NOTE — Telephone Encounter (Signed)
 Due to transportation Linda Mcgrath has not been able to make her appointments. 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 I reached out to our transportation coordinator and he is looking into setting up transportation services for Linda Mcgrath. Linda Mcgrath stated we will keep Linda Mcgrath scheduled as is on 4/24.

## 2023-12-04 NOTE — Telephone Encounter (Signed)
 Completed - Bethel informed me to call [name] as she provides her transportation to the office.

## 2023-12-08 ENCOUNTER — Other Ambulatory Visit: Payer: Self-pay

## 2023-12-08 MED ORDER — LEVOTHYROXINE SODIUM 100 MCG PO TABS: 100.0000 ug | ORAL_TABLET | Freq: Every day | ORAL | 1 refills | Status: DC

## 2023-12-11 NOTE — Progress Notes (Signed)
 Pharmacist Chemotherapy Monitoring - Initial Assessment    Anticipated start date: 12/18/23   The following has been reviewed per standard work regarding the patient's treatment regimen: The patient's diagnosis, treatment plan and drug doses, and organ/hematologic function Lab orders and baseline tests specific to treatment regimen  The treatment plan start date, drug sequencing, and pre-medications Prior authorization status  Patient's documented medication list, including drug-drug interaction screen and prescriptions for anti-emetics and supportive care specific to the treatment regimen The drug concentrations, fluid compatibility, administration routes, and timing of the medications to be used The patient's access for treatment and lifetime cumulative dose history, if applicable  The patient's medication allergies and previous infusion related reactions, if applicable   Changes made to treatment plan:  N/A  Follow up needed:  N/A   Kandy Towery, PharmD, MBA

## 2023-12-12 ENCOUNTER — Encounter (HOSPITAL_COMMUNITY)
Admission: RE | Admit: 2023-12-12 | Discharge: 2023-12-12 | Disposition: A | Discharge status: Home / Self Care | Source: Ambulatory Visit | Attending: Oncology

## 2023-12-12 DIAGNOSIS — C4492 Squamous cell carcinoma of skin, unspecified: Secondary | ICD-10-CM | POA: Insufficient documentation

## 2023-12-12 DIAGNOSIS — R9389 Abnormal findings on diagnostic imaging of other specified body structures: Secondary | ICD-10-CM | POA: Diagnosis not present

## 2023-12-12 LAB — GLUCOSE, CAPILLARY: Glucose-Capillary: 93 mg/dL (ref 70–99)

## 2023-12-12 MED ORDER — FLUDEOXYGLUCOSE F - 18 (FDG) INJECTION
7.4000 | Freq: Once | INTRAVENOUS | Status: AC | PRN
  Administered 2023-12-12: 7.4 via INTRAVENOUS

## 2023-12-16 ENCOUNTER — Other Ambulatory Visit: Payer: Self-pay | Admitting: Family Medicine

## 2023-12-16 DIAGNOSIS — H401131 Primary open-angle glaucoma, bilateral, mild stage: Secondary | ICD-10-CM | POA: Diagnosis not present

## 2023-12-17 ENCOUNTER — Other Ambulatory Visit: Payer: Self-pay | Admitting: Nurse Practitioner

## 2023-12-17 ENCOUNTER — Telehealth: Payer: Self-pay | Admitting: Oncology

## 2023-12-17 NOTE — Telephone Encounter (Signed)
 Mary called to confirm [facility] appointments scheduled for tomorrow.

## 2023-12-18 ENCOUNTER — Inpatient Hospital Stay

## 2023-12-18 ENCOUNTER — Inpatient Hospital Stay (HOSPITAL_BASED_OUTPATIENT_CLINIC_OR_DEPARTMENT_OTHER): Admitting: Oncology

## 2023-12-18 VITALS — BP 133/64 | HR 56 | Resp 18

## 2023-12-18 VITALS — BP 164/68 | HR 58 | Temp 98.1°F | Resp 18 | Ht 62.0 in | Wt 147.6 lb

## 2023-12-18 DIAGNOSIS — L299 Pruritus, unspecified: Secondary | ICD-10-CM | POA: Diagnosis not present

## 2023-12-18 DIAGNOSIS — Z79890 Hormone replacement therapy (postmenopausal): Secondary | ICD-10-CM | POA: Diagnosis not present

## 2023-12-18 DIAGNOSIS — Z79620 Long term (current) use of immunosuppressive biologic: Secondary | ICD-10-CM | POA: Diagnosis not present

## 2023-12-18 DIAGNOSIS — C4492 Squamous cell carcinoma of skin, unspecified: Secondary | ICD-10-CM

## 2023-12-18 DIAGNOSIS — Z5112 Encounter for antineoplastic immunotherapy: Secondary | ICD-10-CM | POA: Diagnosis not present

## 2023-12-18 DIAGNOSIS — E039 Hypothyroidism, unspecified: Secondary | ICD-10-CM | POA: Diagnosis not present

## 2023-12-18 LAB — CBC WITH DIFFERENTIAL (CANCER CENTER ONLY)
Abs Immature Granulocytes: 0.04 10*3/uL (ref 0.00–0.07)
Basophils Absolute: 0.1 10*3/uL (ref 0.0–0.1)
Basophils Relative: 1 %
Eosinophils Absolute: 0.3 10*3/uL (ref 0.0–0.5)
Eosinophils Relative: 4 %
HCT: 43.1 % (ref 36.0–46.0)
Hemoglobin: 14.6 g/dL (ref 12.0–15.0)
Immature Granulocytes: 1 %
Lymphocytes Relative: 38 %
Lymphs Abs: 3.2 10*3/uL (ref 0.7–4.0)
MCH: 31.7 pg (ref 26.0–34.0)
MCHC: 33.9 g/dL (ref 30.0–36.0)
MCV: 93.7 fL (ref 80.0–100.0)
Monocytes Absolute: 0.6 10*3/uL (ref 0.1–1.0)
Monocytes Relative: 7 %
Neutro Abs: 4.2 10*3/uL (ref 1.7–7.7)
Neutrophils Relative %: 49 %
Platelet Count: 262 10*3/uL (ref 150–400)
RBC: 4.6 MIL/uL (ref 3.87–5.11)
RDW: 12.4 % (ref 11.5–15.5)
WBC Count: 8.4 10*3/uL (ref 4.0–10.5)
nRBC: 0 % (ref 0.0–0.2)

## 2023-12-18 LAB — CMP (CANCER CENTER ONLY)
ALT: 14 U/L (ref 0–44)
AST: 17 U/L (ref 15–41)
Albumin: 4.2 g/dL (ref 3.5–5.0)
Alkaline Phosphatase: 54 U/L (ref 38–126)
Anion gap: 6 (ref 5–15)
BUN: 14 mg/dL (ref 8–23)
CO2: 26 mmol/L (ref 22–32)
Calcium: 9.4 mg/dL (ref 8.9–10.3)
Chloride: 107 mmol/L (ref 98–111)
Creatinine: 0.88 mg/dL (ref 0.44–1.00)
GFR, Estimated: >60 mL/min (ref >60)
Glucose, Bld: 99 mg/dL (ref 70–99)
Potassium: 4.1 mmol/L (ref 3.5–5.1)
Sodium: 139 mmol/L (ref 135–145)
Total Bilirubin: 1 mg/dL (ref 0.0–1.2)
Total Protein: 6.9 g/dL (ref 6.5–8.1)

## 2023-12-18 LAB — TSH: TSH: 7.31 u[IU]/mL — ABNORMAL HIGH (ref 0.350–4.500)

## 2023-12-18 MED ORDER — SODIUM CHLORIDE 0.9 % IV SOLN
350.0000 mg | Freq: Once | INTRAVENOUS | Status: AC
  Administered 2023-12-18: 350 mg via INTRAVENOUS
  Filled 2023-12-18: qty 7

## 2023-12-18 MED ORDER — SODIUM CHLORIDE 0.9 % IV SOLN: INTRAVENOUS | Status: DC

## 2023-12-18 NOTE — Progress Notes (Unsigned)
 Patient seen by Dr. Archie Patten Pasam today  Vitals are within treatment parameters:Yes   Labs are within treatment parameters: Yes   Treatment plan has been signed: Yes   Per physician team, Patient is ready for treatment and there are NO modifications to the treatment plan.

## 2023-12-18 NOTE — Patient Instructions (Addendum)
 [facility] - A DEPT OF [facility]  Discharge Instructions: Thank you for choosing [facility] to provide your oncology and hematology care.   If you have a lab appointment with the Cancer Center, please go directly to the Cancer Center and check in at the registration area.   Wear comfortable clothing and clothing appropriate for easy access to any Portacath or PICC line.   We strive to give you quality time with your provider. You may need to reschedule your appointment if you arrive late (15 or more minutes).  Arriving late affects you and other patients whose appointments are after yours.  Also, if you miss three or more appointments without notifying the office, you may be dismissed from the clinic at the provider's discretion.      For prescription refill requests, have your pharmacy contact our office and allow 72 hours for refills to be completed.    Today you received the following chemotherapy and/or immunotherapy agents:  Cemiplimab    To help prevent nausea and vomiting after your treatment, we encourage you to take your nausea medication as directed.  BELOW ARE SYMPTOMS THAT SHOULD BE REPORTED IMMEDIATELY: *FEVER GREATER THAN 100.4 F (38 C) OR HIGHER *CHILLS OR SWEATING *NAUSEA AND VOMITING THAT IS NOT CONTROLLED WITH YOUR NAUSEA MEDICATION *UNUSUAL SHORTNESS OF BREATH *UNUSUAL BRUISING OR BLEEDING *URINARY PROBLEMS (pain or burning when urinating, or frequent urination) *BOWEL PROBLEMS (unusual diarrhea, constipation, pain near the anus) TENDERNESS IN MOUTH AND THROAT WITH OR WITHOUT PRESENCE OF ULCERS (sore throat, sores in mouth, or a toothache) UNUSUAL RASH, SWELLING OR PAIN  UNUSUAL VAGINAL DISCHARGE OR ITCHING   Items with * indicate a potential emergency and should be followed up as soon as possible or go to the Emergency Department if any problems should occur.  Please show the CHEMOTHERAPY ALERT CARD or IMMUNOTHERAPY  ALERT CARD at check-in to the Emergency Department and triage nurse.  Should you have questions after your visit or need to cancel or reschedule your appointment, please contact [facility] - A DEPT OF [facility]  Dept: [phone]  and follow the prompts.  Office hours are 8:00 a.m. to 4:30 p.m. Monday - Friday. Please note that voicemails left after 4:00 p.m. may not be returned until the following business day.  We are closed weekends and major holidays. You have access to a nurse at all times for urgent questions. Please call the main number to the clinic Dept: [phone] and follow the prompts.   For any non-urgent questions, you may also contact your provider using MyChart. We now offer e-Visits for anyone 36 and older to request care online for non-urgent symptoms. For details visit [email or web address].   Also download the MyChart app! Go to the app store, search "MyChart", open the app, select [facility], and log in with your MyChart username and password.   Cemiplimab  Injection What is this medication? CEMIPLIMAB  (se MIP li mab) treats skin cancer and lung cancer. It works by helping your immune system slow or stop the spread of cancer cells. It is a monoclonal antibody. This medicine may be used for other purposes; ask your health care provider or pharmacist if you have questions. COMMON BRAND NAME(S): LIBTAYO  What should I tell my care team before I take this medication? They need to know if you have any of these conditions: Allogeneic stem cell transplant (uses someone else's stem cells) Autoimmune  diseases, such as Crohn disease, ulcerative colitis, lupus History of chest radiation Nervous system problems, such as Guillain-Barre syndrome or myasthenia gravis Organ transplant An unusual or allergic reaction to cemiplimab , other medications, foods, dyes, or preservatives Pregnant or trying to get pregnant Breastfeeding How should I use this  medication? This medication is infused into a vein. It is given by your care team in a hospital or clinic setting. A special MedGuide will be given to you before each treatment. Be sure to read this information carefully each time. Talk to your care team about the use of this medication in children. Special care may be needed. Overdosage: If you think you have taken too much of this medicine contact a poison control center or emergency room at once. NOTE: This medicine is only for you. Do not share this medicine with others. What if I miss a dose? Keep appointments for follow-up doses. It is important not to miss your dose. Call your care team if you are unable to keep an appointment. What may interact with this medication? Interactions have not been studied. This list may not describe all possible interactions. Give your health care provider a list of all the medicines, herbs, non-prescription drugs, or dietary supplements you use. Also tell them if you smoke, drink alcohol, or use illegal drugs. Some items may interact with your medicine. What should I watch for while using this medication? Visit your care team for regular checks on your progress. It may be some time before you see the benefit from this medication. You may need blood work done while taking this medication. This medication may cause serious skin reactions. They can happen weeks to months after starting the medication. Contact your care team right away if you notice fevers or flu-like symptoms with a rash. The rash may be red or purple and then turn into blisters or peeling of the skin. You may also notice a red rash with swelling of the face, lips, or lymph nodes in your neck or under your arms. Tell your care team right away if you have any change in your eyesight. Talk to your care team if you may be pregnant. You will need a negative pregnancy test before starting this medication. Contraception is recommended while taking this  medication and for 4 months after the last dose. Your care team can help you find the option that works for you. Do not breastfeed while taking this medication and for at least 4 months after the last dose. What side effects may I notice from receiving this medication? Side effects that you should report to your care team as soon as possible: Allergic reactions--skin rash, itching, hives, swelling of the face, lips, tongue, or throat Dry cough, shortness of breath or trouble breathing Eye pain, redness, irritation, or discharge with blurry or decreased vision Heart muscle inflammation--unusual weakness or fatigue, shortness of breath, chest pain, fast or irregular heartbeat, dizziness, swelling of the ankles, feet, or hands Hormone gland problems--headache, sensitivity to light, unusual weakness or fatigue, dizziness, fast or irregular heartbeat, increased sensitivity to cold or heat, excessive sweating, constipation, hair loss, increased thirst or amount of urine, tremors or shaking, irritability Infusion reactions--chest pain, shortness of breath or trouble breathing, feeling faint or lightheaded Kidney injury (glomerulonephritis)--decrease in the amount of urine, red or dark brown urine, foamy or bubbly urine, swelling of the ankles, hands, or feet Liver injury--right upper belly pain, loss of appetite, nausea, light-colored stool, dark yellow or brown urine, yellowing skin or  eyes, unusual weakness or fatigue Pain, tingling, or numbness in the hands or feet, muscle weakness, change in vision, confusion or trouble speaking, loss of balance or coordination, trouble walking, seizures Rash, fever, and swollen lymph nodes Redness, blistering, peeling, or loosening of the skin, including inside the mouth Sudden or severe stomach pain, bloody diarrhea, fever, nausea, vomiting Side effects that usually do not require medical attention (report these to your care team if they continue or are  bothersome): Bone, joint, or muscle pain Diarrhea Fatigue Loss of appetite Nausea Skin rash This list may not describe all possible side effects. Call your doctor for medical advice about side effects. You may report side effects to FDA at 1-800-FDA-1088. Where should I keep my medication? This medication is given in a hospital or clinic and will not be stored at home. NOTE: This sheet is a summary. It may not cover all possible information. If you have questions about this medicine, talk to your doctor, pharmacist, or health care provider.  2024 Elsevier/Gold Standard (2022-09-30 00:00:00)

## 2023-12-18 NOTE — Progress Notes (Unsigned)
 [facility]  ONCOLOGY CLINIC PROGRESS NOTE   Patient Care Team: [name], Manley Seeds, MD as PCP - General Ann Barnacle, MD (Inactive) as Consulting Physician (Urology) Jorizzo, Laurine Pore, MD as Referring Physician (Dermatology) Dema Filler, MD as Consulting Physician (Ophthalmology) Deneise Finlay, MD (Dermatology)  PATIENT NAME: Linda Mcgrath   MR#: [id number] DOB: 29-Dec-1932  Date of visit: 12/18/2023   ASSESSMENT & PLAN:   Linda Mcgrath is a 89 y.o.  pleasant lady with a past medical history of dyslipidemia, hypothyroidism, colon polyps, was referred to our clinic in March 2025 for recurrent squamous of carcinoma of the skin.   No problem-specific Assessment & Plan notes found for this encounter.    Assessment and Plan Assessment & Plan      I reviewed lab results and outside records for this visit and discussed relevant results with the patient. Diagnosis, plan of care and treatment options were also discussed in detail with the patient. Opportunity provided to ask questions and answers provided to her apparent satisfaction. Provided instructions to call our clinic with any problems, questions or concerns prior to return visit. I recommended to continue follow-up with PCP and sub-specialists. She verbalized understanding and agreed with the plan.   NCCN guidelines have been consulted in the planning of this patient's care.  I spent a total of 30 minutes during this encounter with the patient including review of chart and various tests results, discussions about plan of care and coordination of care plan.   Arlo Berber, MD  12/18/2023 3:32 PM  [facility] - A DEPT OF [facility] [address] [place] [address] Dept: [phone] Dept Fax: [phone]    CHIEF COMPLAINT/ REASON FOR VISIT:   ***  Current Treatment:  ***  INTERVAL HISTORY:    Discussed  the use of AI scribe software for clinical note transcription with the patient, who gave verbal consent to proceed.  History of Present Illness      ***  I have reviewed the past medical history, past surgical history, social history and family history with the patient and they are unchanged from previous note.  HISTORY OF PRESENT ILLNESS:   ONCOLOGY HISTORY:   She was first diagnosed with squamous cell carcinoma of the skin on dorsal aspect of left hand in September 2024 and underwent Mohs surgery followed by full-thickness skin graft repair in October 2024.  This healed well.   Later on she was found to have multiple skin lesions scattered all across her extremities, torso, which were felt to be squamous cell carcinomas.  She did have biopsies on 10/06/2023 from left lower leg, left forearm and right forearm all of which showed well-differentiated squamous cell carcinomas.   At this point, her dermatologist Dr. Fain Home sent a referral to us  for consideration of systemic treatment with cemiplimab  given recurrent squamous cell carcinoma of the skin.   Given the multiple number of skin lesions, with pruritus, plan is to proceed with systemic treatments using cemiplimab , pending authorization.   We did submit request for baseline whole-body PET scan.  multiple diffuse skin lesions over a three-month period, following a previous excision and skin graft for squamous cell carcinoma that extended to the bone.    Lesions are widespread, itchy, and confirmed as squamous cell carcinoma through biopsy on 10/06/2023, indicating diffuse spread. The goal is disease control and prevention of further spread.   Cemiplimab , an  immunotherapy, is recommended, with a better tolerance profile than chemotherapy. Treatment is administered IV every three weeks indefinitely, with monitoring for immune-mediated side effects, particularly thyroid  function.   We will order whole-body PET scan for baseline.   -  Submit approval request for cemiplimab  - Arrange education session on cemiplimab , including side effects and management - Administer cemiplimab  IV every three weeks - Monitor thyroid  function for immune-mediated side effects - Prescribe anti-nausea medication as a precaution - Perform baseline blood work to monitor electrolytes and blood counts   Experiences severe pruritus associated with skin lesions. Current topical treatments, including triamcinolone  cream, provide limited relief. Oral antihistamines like Benadryl are not recommended due to drowsiness and fall risk. Zyrtec is recommended for its lower drowsiness risk.     Oncology History  Recurrent squamous cell carcinoma of skin  10/22/2023 Initial Diagnosis   Recurrent squamous cell carcinoma of skin   12/18/2023 -  Chemotherapy   Patient is on Treatment Plan : ADVANCED CUTANEOUS SQUAMOUS CELL CARCINOMA Cemiplimab  [id number]         REVIEW OF SYSTEMS:   Review of Systems - Oncology  All other pertinent systems were reviewed with the patient and are negative.  ALLERGIES: She is allergic to shellfish allergy, tetracycline, amlodipine besylate, ciprofloxacin, codeine, propoxyphene hcl, sulfamethoxazole-trimethoprim, tape, xarelto  [rivaroxaban ], amoxicillin, other, and penicillins.  MEDICATIONS:  Current Outpatient Medications  Medication Sig Dispense Refill   cholestyramine  (QUESTRAN ) 4 g packet MIX AND DRINK 1 PACKET DAILY 90 each 1   GEMTESA 75 MG TABS Take 1 tablet by mouth daily.     levothyroxine  (SYNTHROID ) 100 MCG tablet Take 1 tablet (100 mcg total) by mouth daily. 30 tablet 1   solifenacin  (VESICARE ) 10 MG tablet Take 1 tablet (10 mg total) by mouth daily. 90 tablet 1   triamcinolone  cream (KENALOG ) 0.1 % Apply 1 Application topically daily as needed. 453 g 2   alendronate  (FOSAMAX ) 70 MG tablet Take 1 tablet (70 mg total) by mouth once a week. Take with a full glass of water on an empty stomach. (Patient not taking:  Reported on 11/20/2023) 12 tablet 2   Ascorbic Acid  (VITAMIN C  PO) Take 500 mg by mouth daily. (Patient not taking: Reported on 11/20/2023)     B Complex Vitamins (VITAMIN B COMPLEX  PO) Take 1 tablet by mouth daily. (Patient not taking: Reported on 11/20/2023)     B COMPLEX, FOLIC ACID , PO Take 666 mcg by mouth daily. (Patient not taking: Reported on 11/20/2023)     Biotin  10 MG TABS Take 1 tablet by mouth every morning.  (Patient not taking: Reported on 11/20/2023)     Calcium  Citrate-Vitamin D  250-200 MG-UNIT TABS Take 1 tablet by mouth daily. (Patient not taking: Reported on 11/20/2023)     Cranberry 180 MG CAPS Take 2 capsules by mouth daily. (Patient not taking: Reported on 11/20/2023)     dextromethorphan-guaiFENesin  (MUCINEX  DM) 30-600 MG 12hr tablet Take 1 tablet by mouth 2 (two) times daily. (Patient not taking: Reported on 11/20/2023) 15 tablet 0   Fexofenadine HCl (MUCINEX  ALLERGY PO) Take 1 tablet by mouth daily as needed (Allergies).  (Patient not taking: Reported on 11/20/2023)     fluticasone  (FLONASE ) 50 MCG/ACT nasal spray Place 1 spray into both nostrils daily for 3 days. 16 g 0   folic acid  (FOLVITE ) 1 MG tablet Take 1 mg by mouth daily. (Patient not taking: Reported on 11/20/2023)     MAGNESIUM -OXIDE PO Take 1 tablet by mouth daily. (Patient not  taking: Reported on 11/20/2023)     Multiple Vitamin (MULTIVITAMIN) capsule Take 1 capsule by mouth daily. (Patient not taking: Reported on 11/20/2023)     ondansetron  (ZOFRAN ) 8 MG tablet Take 1 tablet (8 mg total) by mouth every 8 (eight) hours as needed for nausea or vomiting. (Patient not taking: Reported on 12/18/2023) 30 tablet 1   prochlorperazine  (COMPAZINE ) 10 MG tablet Take 1 tablet (10 mg total) by mouth every 6 (six) hours as needed for nausea or vomiting. (Patient not taking: Reported on 12/18/2023) 30 tablet 1   Psyllium (METAMUCIL FIBER PO) Take by mouth at bedtime. 2 tablespoon     tiZANidine  (ZANAFLEX ) 4 MG tablet Take 1 tablet (4 mg  total) by mouth at bedtime as needed for muscle spasms. (Patient not taking: Reported on 11/20/2023) 30 tablet 1   TURMERIC PO Take 1 capsule by mouth 2 (two) times daily.  (Patient not taking: Reported on 11/20/2023)     vitamin B-12 (CYANOCOBALAMIN ) 500 MCG tablet Take 500 mcg by mouth daily. (Patient not taking: Reported on 11/20/2023)     Vitamin D , Cholecalciferol , 50 MCG (2000 UT) CAPS Take 2 tablets by mouth daily. (Patient not taking: Reported on 11/20/2023)     No current facility-administered medications for this visit.     VITALS:   Blood pressure (!) 164/68, pulse (!) 58, temperature 98.1 F (36.7 C), temperature source Temporal, resp. rate 18, height 5\' 2"  (1.575 m), weight 147 lb 9.6 oz (67 kg), last menstrual period 08/26/1969, SpO2 98%.  Wt Readings from Last 3 Encounters:  12/18/23 147 lb 9.6 oz (67 kg)  11/20/23 149 lb 6.4 oz (67.8 kg)  10/22/23 148 lb 6 oz (67.3 kg)    Body mass index is 27 kg/m.  ***   Onc Performance Status - 12/18/23 1520       ECOG Perf Status   ECOG Perf Status Restricted in physically strenuous activity but ambulatory and able to carry out work of a light or sedentary nature, e.g., light house work, office work      KPS SCALE   KPS % SCORE Able to carry on normal activity, minor s/s of disease             PHYSICAL EXAM:   Physical Exam   ***  LABORATORY DATA:   I have reviewed the data as listed.  Results for orders placed or performed in visit on 12/18/23  CMP (Cancer Center only)  Result Value Ref Range   Sodium 139 135 - 145 mmol/L   Potassium 4.1 3.5 - 5.1 mmol/L   Chloride 107 98 - 111 mmol/L   CO2 26 22 - 32 mmol/L   Glucose, Bld 99 70 - 99 mg/dL   BUN 14 8 - 23 mg/dL   Creatinine 1.61 0.96 - 1.00 mg/dL   Calcium  9.4 8.9 - 10.3 mg/dL   Total Protein 6.9 6.5 - 8.1 g/dL   Albumin 4.2 3.5 - 5.0 g/dL   AST 17 15 - 41 U/L   ALT 14 0 - 44 U/L   Alkaline Phosphatase 54 38 - 126 U/L   Total Bilirubin 1.0 0.0 - 1.2  mg/dL   GFR, Estimated >04 >54 mL/min   Anion gap 6 5 - 15  CBC with Differential (Cancer Center Only)  Result Value Ref Range   WBC Count 8.4 4.0 - 10.5 K/uL   RBC 4.60 3.87 - 5.11 MIL/uL   Hemoglobin 14.6 12.0 - 15.0 g/dL   HCT 09.8 11.9 -  46.0 %   MCV 93.7 80.0 - 100.0 fL   MCH 31.7 26.0 - 34.0 pg   MCHC 33.9 30.0 - 36.0 g/dL   RDW 16.1 09.6 - 04.5 %   Platelet Count 262 150 - 400 K/uL   nRBC 0.0 0.0 - 0.2 %   Neutrophils Relative % 49 %   Neutro Abs 4.2 1.7 - 7.7 K/uL   Lymphocytes Relative 38 %   Lymphs Abs 3.2 0.7 - 4.0 K/uL   Monocytes Relative 7 %   Monocytes Absolute 0.6 0.1 - 1.0 K/uL   Eosinophils Relative 4 %   Eosinophils Absolute 0.3 0.0 - 0.5 K/uL   Basophils Relative 1 %   Basophils Absolute 0.1 0.0 - 0.1 K/uL   Immature Granulocytes 1 %   Abs Immature Granulocytes 0.04 0.00 - 0.07 K/uL      RADIOGRAPHIC STUDIES:  I have personally reviewed the radiological images as listed and agree with the findings in the report.  NM PET Image Initial (PI) Whole Body Result Date: 12/15/2023 CLINICAL DATA:  Initial treatment strategy for squamous cell carcinoma of the skin. Diffuse lesions. EXAM: NUCLEAR MEDICINE PET WHOLE BODY TECHNIQUE: 7.4 mCi F-18 FDG was injected intravenously. Full-ring PET imaging was performed from the head to foot after the radiotracer. CT data was obtained and used for attenuation correction and anatomic localization. Fasting blood glucose: 93 mg/dl COMPARISON:  None Available. FINDINGS: Mediastinal blood pool activity: SUV max 4.1 HEAD/NECK: No specific abnormal radiotracer uptake identified including along the lymph node change of the submandibular, posterior triangle internal jugular region. Exception is some prominent uptake along thyroid  gland diffusely maximum SUV of 10.9. Please correlate clinical findings. The thyroid  gland is not appear to be enlarged. Near symmetric uptake of the intracranial compartment. Incidental CT findings: Streak  artifact related to the patient's dental hardware. The visualized paranasal sinuses and mastoid air cells are clear. The parotid glands, submandibular glands thyroid  glands unremarkable. CHEST: There is some prominent uptake along the loculated area of fat in the mediastinum between the SVC and the left atrium. Maximum SUV value of 10.0. There is also some mild uptake along extrapleural fat along the posterior aspect of the thorax. Maximum SUV of 5.0. No other areas of abnormal uptake in the thorax including along lymph node chains in the axillary region, hilum or mediastinum. No specific areas of abnormal lung uptake. Incidental CT findings: Scattered vascular calcifications are seen along the aorta and coronary vessels. Heart is nonenlarged. No pericardial effusion. There is some mild pleural thickening at the left lung base with some presumed atelectasis dependently. Breathing motion. There is some volume loss along left hemithorax compared to right. There is a noncalcified left upper lobe nodule which is not show any abnormal uptake but is stable going back to a CT scan 2017 demonstrating long-term stability of 9 mm. ABDOMEN/PELVIS: Physiologic distribution radiotracer along the renal collecting systems and bowel. There is some mild uptake along area of nodularity in left adrenal gland with maximum SUV value of 5.2. Area measures 12 mm on CT image 112. Hounsfield unit of the area of 2.2. Small adenoma. Incidental CT findings: Previous cholecystectomy. Left hepatic lobe small cyst. No obstructing renal stones. Contracted urinary bladder. Large bowel is nondilated with scattered stool. Left-sided colonic diverticula. No free air or free fluid. Skeleton/extremities: No abnormal uptake along the visualized osseous structures. There are some small areas of dermal nodularity with uptake identified along the lower extremities. Example posteriorly about the left calf has  small focus on image 298 with maximum SUV of 3.5.  Focus posterior aspect of the right leg medially at the level of the distal femoral metaphysis has maximum SUV value of 5.6 on image 257. Additional focus posterior along the left calf on image 320. Few other small foci elsewhere along the lower extremities. Please correlate for location of dermal metastatic lesions. IMPRESSION: Few scattered lower extremity dermal hypermetabolic lesions. Please correlate for clinical findings of location of neoplasm. No specific abnormal uptake along lymph nodes or other areas of metastatic soft tissue at this time. There is uptake along posterior thoracic extrapleural fat as well as some uptake along the fat in the mediastinum on the right side, likely physiologic/brown fat. Electronically Signed   By: Adrianna Horde M.D.   On: 12/15/2023 17:41    CODE STATUS:  Code Status History     Date Active Date Inactive Code Status Order ID Comments User Context   07/31/2017 1836 08/05/2017 1524 Full Code [id number]  Isadora Mar, MD Inpatient   04/03/2016 1258 04/03/2016 2124 Full Code [id number]  Lei Pump, MD Inpatient   03/17/2016 1726 03/18/2016 1457 Full Code [id number]  Nohemi Batters, NP ED   03/06/2016 1903 03/08/2016 1529 Full Code [id number]  Nohemi Batters, NP ED       No orders of the defined types were placed in this encounter.    Future Appointments  Date Time Provider Department Center  01/06/2024  2:45 PM Deneise Finlay, MD [facility] None  01/08/2024 11:00 AM [facility] LAB [facility] None  01/08/2024 11:30 AM Shomari Matusik, MD [facility] None  01/08/2024 12:30 PM [facility] INFUSION [facility] None  01/13/2024 10:30 AM [facility] LAB [facility]EC  01/29/2024 10:00 AM [facility] LAB [facility] None  01/29/2024 10:30 AM Azadeh Hyder, MD [facility] None  01/29/2024 11:15 AM [facility] INFUSION [facility] None  02/19/2024 10:30 AM [facility] LAB [facility] None  02/19/2024 11:00 AM Yaxiel Minnie, MD [facility] None  02/19/2024 11:45 AM  [facility] INFUSION [facility] None  03/11/2024  9:30 AM [facility] LAB [facility] None  03/11/2024 10:00 AM Lesley Galentine, MD [facility] None  03/11/2024 10:30 AM [facility] INFUSION [facility] None  10/01/2024  1:40 PM [facility] ANNUAL WELLNESS VISIT 1 [facility] PEC      This document was completed utilizing speech recognition software. Grammatical errors, random word insertions, pronoun errors, and incomplete sentences are an occasional consequence of this system due to software limitations, ambient noise, and hardware issues. Any formal questions or concerns about the content, text or information contained within the body of this dictation should be directly addressed to the provider for clarification.

## 2023-12-19 ENCOUNTER — Telehealth: Payer: Self-pay | Admitting: *Deleted

## 2023-12-19 ENCOUNTER — Encounter: Payer: Self-pay | Admitting: Oncology

## 2023-12-19 LAB — T4: T4, Total: 7.4 ug/dL (ref 4.5–12.0)

## 2023-12-19 NOTE — Assessment & Plan Note (Signed)
 Experiences severe pruritus associated with skin lesions. Current topical treatments, including triamcinolone cream, provide limited relief. Oral antihistamines like Benadryl are not recommended due to drowsiness and fall risk. Zyrtec is recommended for its lower drowsiness risk. - Recommend over-the-counter Zyrtec for pruritus

## 2023-12-19 NOTE — Telephone Encounter (Signed)
-----   Message from Nurse Brigido Canales sent at 12/18/2023  4:51 PM EDT ----- Regarding: Dr. Randye Buttner 1st tx f/u call Dr. Randye Buttner 1st tx f/u call - Libtayo  - tolerated well

## 2023-12-19 NOTE — Assessment & Plan Note (Signed)
 Hypothyroidism with recent levothyroxine  adjustment to 100 micrograms due to elevated TSH levels. TSH improved from 25 in March to 12, but remains above the normal range of 5.5. Monitoring of thyroid  function is necessary due to potential effects of current medication regimen. - Monitor thyroid  function tests to assess TSH levels. - Continue levothyroxine  100 micrograms. - Reassess thyroid  function in follow-up visits.

## 2023-12-19 NOTE — Telephone Encounter (Signed)
 Called pt to see how she did with her recent treatment.  She reports doing well & denies any problems.  She knows her next appts & how to reach [place] if needed.

## 2023-12-19 NOTE — Assessment & Plan Note (Signed)
 Presents with multiple diffuse skin lesions over a three-month period, following a previous excision and skin graft for squamous cell carcinoma that extended to the bone.    Lesions are widespread, itchy, and confirmed as squamous cell carcinoma through biopsy on 10/06/2023, indicating diffuse spread. The goal is disease control and prevention of further spread.   Cemiplimab , an immunotherapy, is recommended, with a better tolerance profile than chemotherapy. Treatment is administered IV every three weeks indefinitely, with monitoring for immune-mediated side effects, particularly thyroid  function.   On 12/12/2023, baseline whole-body PET scan was obtained.  It showed few scattered lower extremity dermal hypermetabolic lesions.  No abnormal uptake in the lymph nodes or other areas of metastatic soft tissue.  Discussed PET scan results with the patient and her caregiver today.  Labs today reveal no dose-limiting toxicities.  Will proceed with cemiplimab  initiation from today (12/18/2023) and continue this every 3 weeks indefinitely.  RTC in 3 weeks for labs, follow-up and continuation of cemiplimab .

## 2024-01-06 ENCOUNTER — Encounter: Payer: Self-pay | Admitting: Dermatology

## 2024-01-06 ENCOUNTER — Ambulatory Visit: Admitting: Dermatology

## 2024-01-06 DIAGNOSIS — L299 Pruritus, unspecified: Secondary | ICD-10-CM

## 2024-01-06 DIAGNOSIS — C44629 Squamous cell carcinoma of skin of left upper limb, including shoulder: Secondary | ICD-10-CM | POA: Diagnosis not present

## 2024-01-06 DIAGNOSIS — Z79899 Other long term (current) drug therapy: Secondary | ICD-10-CM

## 2024-01-06 DIAGNOSIS — C4492 Squamous cell carcinoma of skin, unspecified: Secondary | ICD-10-CM | POA: Diagnosis not present

## 2024-01-06 DIAGNOSIS — Z85828 Personal history of other malignant neoplasm of skin: Secondary | ICD-10-CM | POA: Diagnosis not present

## 2024-01-06 DIAGNOSIS — D492 Neoplasm of unspecified behavior of bone, soft tissue, and skin: Secondary | ICD-10-CM

## 2024-01-06 DIAGNOSIS — C44729 Squamous cell carcinoma of skin of left lower limb, including hip: Secondary | ICD-10-CM

## 2024-01-06 DIAGNOSIS — C44722 Squamous cell carcinoma of skin of right lower limb, including hip: Secondary | ICD-10-CM

## 2024-01-06 DIAGNOSIS — D485 Neoplasm of uncertain behavior of skin: Secondary | ICD-10-CM

## 2024-01-06 MED ORDER — TRIAMCINOLONE ACETONIDE 0.1 % EX OINT: 1.0000 | TOPICAL_OINTMENT | Freq: Every day | CUTANEOUS | 2 refills | Status: DC | PRN

## 2024-01-06 MED ORDER — ACITRETIN 10 MG PO CAPS: 10.0000 mg | ORAL_CAPSULE | Freq: Every day | ORAL | 1 refills | Status: DC

## 2024-01-06 NOTE — Progress Notes (Signed)
 Follow Up Visit   Subjective  Linda Mcgrath is a 89 y.o. female who presents for the following: follow up on treatment for recurrent SCC withdiffuse spread.  The following portions of the chart were reviewed this encounter and updated as appropriate: medications, allergies, medical history  Review of Systems:  No other skin or systemic complaints except as noted in HPI or Assessment and Plan.  Objective  Well appearing patient in no apparent distress; mood and affect are within normal limits.  A full examination was performed including scalp, head, face and left arm. All findings within normal limits unless otherwise noted below.    Relevant physical exam findings are noted in the Assessment and Plan.  Left Forearm - Posterior Extensive exophytic tender and pruritic nodules      Assessment & Plan   Recurrent squamous cell carcinoma of skin  Presents with multiple diffuse skin lesions that are wide spread over her entire body.  The patient was counseled regarding diffuse cutaneous squamous cell carcinomas (SCCs) with associated pruritus, currently status post one treatment with cemiplimab  under the care of oncology, with limited clinical improvement noted to date. We discussed the initiation of acitretin as adjunctive therapy to help reduce new lesion formation and potentially improve skin disease burden. The patient was counseled on potential side effects of acitretin, including mucocutaneous dryness, elevated lipids, hepatotoxicity, and teratogenicity. The importance of baseline and ongoing laboratory monitoring was emphasized. A lipid panel will be added to her upcoming cemiplimab  infusion labs scheduled in 2 days to expedite initiation of therapy. In the interim, we recommend palliative removal and debulking of the most symptomatic or bothersome tumors for symptomatic relief. The patient will follow up in 4 weeks for reassessment after starting acitretin, at which point  we will evaluate treatment response and tolerability. - triamcinolone  ointment (KENALOG ) 0.1 %; Apply 1 Application topically daily as needed.  Dispense: 453 g; Refill: 2 - Skin / nail biopsy - Skin / nail biopsy - Skin / nail biopsy - Skin / nail biopsy  High Risk Medication- Acitretin - acitretin (SORIATANE) 10 MG capsule; Take 1 capsule (10 mg total) by mouth daily before breakfast.  Dispense: 30 capsule; Refill: 1 - Lipid Panel - Surgical pathology; Standing - Surgical pathology  Healing s/p Mohs for SCC, treated on 06/16/23, repaired with FTSG - Reassured that wound is healing well - Discussed that scars take up to 12 months to mature from the date of surgery - Recommend SPF 30+ to scar daily to prevent purple color from UV exposure during scar maturation process - Discussed that erythema and raised appearance of scar will fade over the next 4-6 months - OK to start scar massage at 4-6 weeks post-op - Can consider silicone based products for scar healing starting at 6 weeks post-op   Established patient visit requiring high level of medical decision-making.  Chronic problem with significant complexity: diffuse cutaneous squamous cell carcinomas (SCCs) with associated pruritus and limited response to cemiplimab .  New management decision: initiation of systemic acitretin discussed in detail, including risks (e.g., hepatotoxicity, hyperlipidemia, mucocutaneous side effects) and need for baseline and ongoing lab monitoring.  Coordination of care with oncology: lipid panel added to upcoming q3 week labs for cemiplimab  infusion to expedite treatment.  Consideration of procedural intervention: recommendation for palliative removal and debulking of the most bothersome tumors.  Follow-up planning: 4-week reassessment for treatment response and tolerability of acitretin.  Multiple management options reviewed, reflecting high complexity of decision-making.  Visit required  extensive  discussion, care coordination, and planning, supporting use of CPT [phone].  SQUAMOUS CELL CARCINOMA OF SKIN Left Forearm - Posterior Related Medications acitretin (SORIATANE) 10 MG capsule Take 1 capsule (10 mg total) by mouth daily before breakfast. PRURITUS   Related Medications triamcinolone  ointment (KENALOG ) 0.1 % Apply 1 Application topically daily as needed. HIGH RISK MEDICATION USE   Related Procedures Lipid Panel NEOPLASM OF UNCERTAIN BEHAVIOR OF SKIN (4) Right Popliteal Fossa Skin / nail biopsy Type of biopsy: tangential   Informed consent: discussed and consent obtained   Timeout: patient name, date of birth, surgical site, and procedure verified   Procedure prep:  Patient was prepped and draped in usual sterile fashion Prep type:  Isopropyl alcohol Anesthesia: the lesion was anesthetized in a standard fashion   Anesthetic:  1% lidocaine  w/ epinephrine 1-100,000 buffered w/ 8.4% NaHCO3 Instrument used: DermaBlade   Hemostasis achieved with: aluminum chloride and electrodesiccation   Outcome: patient tolerated procedure well   Post-procedure details: sterile dressing applied and wound care instructions given   Dressing type: bandage and petrolatum    Specimen 1 - Surgical pathology Differential Diagnosis: r/o SCC  Check Margins: No Left Lower Leg - Posterior Skin / nail biopsy Type of biopsy: tangential   Informed consent: discussed and consent obtained   Timeout: patient name, date of birth, surgical site, and procedure verified   Procedure prep:  Patient was prepped and draped in usual sterile fashion Prep type:  Isopropyl alcohol Anesthesia: the lesion was anesthetized in a standard fashion   Anesthetic:  1% lidocaine  w/ epinephrine 1-100,000 buffered w/ 8.4% NaHCO3 Instrument used: DermaBlade   Hemostasis achieved with: aluminum chloride and electrodesiccation   Outcome: patient tolerated procedure well   Post-procedure details: sterile dressing applied  and wound care instructions given   Dressing type: bandage and petrolatum    Specimen 2 - Surgical pathology Differential Diagnosis:  r/o SCC  Check Margins: No Left Lower Leg - Posterior Skin / nail biopsy Type of biopsy: tangential   Informed consent: discussed and consent obtained   Timeout: patient name, date of birth, surgical site, and procedure verified   Procedure prep:  Patient was prepped and draped in usual sterile fashion Prep type:  Isopropyl alcohol Anesthesia: the lesion was anesthetized in a standard fashion   Anesthetic:  1% lidocaine  w/ epinephrine 1-100,000 buffered w/ 8.4% NaHCO3 Instrument used: DermaBlade   Hemostasis achieved with: aluminum chloride and electrodesiccation   Outcome: patient tolerated procedure well   Post-procedure details: sterile dressing applied and wound care instructions given   Dressing type: bandage and petrolatum    Specimen 3 - Surgical pathology Differential Diagnosis:  r/o SCC  Check Margins: No Right Lower Leg - Posterior Skin / nail biopsy Type of biopsy: tangential   Informed consent: discussed and consent obtained   Timeout: patient name, date of birth, surgical site, and procedure verified   Procedure prep:  Patient was prepped and draped in usual sterile fashion Prep type:  Isopropyl alcohol Anesthesia: the lesion was anesthetized in a standard fashion   Anesthetic:  1% lidocaine  w/ epinephrine 1-100,000 buffered w/ 8.4% NaHCO3 Instrument used: DermaBlade   Hemostasis achieved with: aluminum chloride and electrodesiccation   Outcome: patient tolerated procedure well   Post-procedure details: sterile dressing applied and wound care instructions given   Dressing type: bandage and petrolatum    Specimen 4 - Surgical pathology Differential Diagnosis:  r/o SCC  Check Margins: No  Return in about 4 weeks (around 02/03/2024) for follow up.  I, Haig Levan, Surg Tech III, am acting as scribe for Deneise Finlay, MD.    Documentation: I have reviewed the above documentation for accuracy and completeness, and I agree with the above.  Deneise Finlay, MD

## 2024-01-08 ENCOUNTER — Encounter: Payer: Self-pay | Admitting: Oncology

## 2024-01-08 ENCOUNTER — Inpatient Hospital Stay

## 2024-01-08 ENCOUNTER — Other Ambulatory Visit: Payer: Self-pay

## 2024-01-08 ENCOUNTER — Inpatient Hospital Stay: Attending: Oncology

## 2024-01-08 ENCOUNTER — Inpatient Hospital Stay (HOSPITAL_BASED_OUTPATIENT_CLINIC_OR_DEPARTMENT_OTHER): Admitting: Oncology

## 2024-01-08 VITALS — BP 139/78 | HR 86 | Temp 98.1°F | Resp 16 | Ht 62.0 in | Wt 144.3 lb

## 2024-01-08 VITALS — BP 140/61 | HR 62 | Temp 97.8°F | Resp 16

## 2024-01-08 DIAGNOSIS — C4492 Squamous cell carcinoma of skin, unspecified: Secondary | ICD-10-CM | POA: Diagnosis not present

## 2024-01-08 DIAGNOSIS — L299 Pruritus, unspecified: Secondary | ICD-10-CM | POA: Diagnosis not present

## 2024-01-08 DIAGNOSIS — R799 Abnormal finding of blood chemistry, unspecified: Secondary | ICD-10-CM | POA: Insufficient documentation

## 2024-01-08 DIAGNOSIS — Z5112 Encounter for antineoplastic immunotherapy: Secondary | ICD-10-CM | POA: Insufficient documentation

## 2024-01-08 LAB — CMP (CANCER CENTER ONLY)
ALT: 12 U/L (ref 0–44)
AST: 14 U/L — ABNORMAL LOW (ref 15–41)
Albumin: 4.1 g/dL (ref 3.5–5.0)
Alkaline Phosphatase: 56 U/L (ref 38–126)
Anion gap: 6 (ref 5–15)
BUN: 18 mg/dL (ref 8–23)
CO2: 25 mmol/L (ref 22–32)
Calcium: 9.2 mg/dL (ref 8.9–10.3)
Chloride: 111 mmol/L (ref 98–111)
Creatinine: 1.03 mg/dL — ABNORMAL HIGH (ref 0.44–1.00)
GFR, Estimated: 52 mL/min — ABNORMAL LOW (ref >60)
Glucose, Bld: 95 mg/dL (ref 70–99)
Potassium: 4 mmol/L (ref 3.5–5.1)
Sodium: 142 mmol/L (ref 135–145)
Total Bilirubin: 1.2 mg/dL (ref 0.0–1.2)
Total Protein: 6.9 g/dL (ref 6.5–8.1)

## 2024-01-08 LAB — CBC WITH DIFFERENTIAL (CANCER CENTER ONLY)
Abs Immature Granulocytes: 0.01 10*3/uL (ref 0.00–0.07)
Basophils Absolute: 0.1 10*3/uL (ref 0.0–0.1)
Basophils Relative: 1 %
Eosinophils Absolute: 0.4 10*3/uL (ref 0.0–0.5)
Eosinophils Relative: 6 %
HCT: 41.6 % (ref 36.0–46.0)
Hemoglobin: 14 g/dL (ref 12.0–15.0)
Immature Granulocytes: 0 %
Lymphocytes Relative: 32 %
Lymphs Abs: 2.2 10*3/uL (ref 0.7–4.0)
MCH: 31.8 pg (ref 26.0–34.0)
MCHC: 33.7 g/dL (ref 30.0–36.0)
MCV: 94.5 fL (ref 80.0–100.0)
Monocytes Absolute: 0.6 10*3/uL (ref 0.1–1.0)
Monocytes Relative: 9 %
Neutro Abs: 3.4 10*3/uL (ref 1.7–7.7)
Neutrophils Relative %: 52 %
Platelet Count: 229 10*3/uL (ref 150–400)
RBC: 4.4 MIL/uL (ref 3.87–5.11)
RDW: 12.3 % (ref 11.5–15.5)
WBC Count: 6.7 10*3/uL (ref 4.0–10.5)
nRBC: 0 % (ref 0.0–0.2)

## 2024-01-08 LAB — LIPID PANEL
Cholesterol: 169 mg/dL (ref 0–200)
HDL: 61 mg/dL (ref >40)
LDL Cholesterol: 100 mg/dL — ABNORMAL HIGH (ref 0–99)
Total CHOL/HDL Ratio: 2.8 ratio
Triglycerides: 40 mg/dL (ref <150)
VLDL: 8 mg/dL (ref 0–40)

## 2024-01-08 LAB — SURGICAL PATHOLOGY

## 2024-01-08 MED ORDER — SODIUM CHLORIDE 0.9 % IV SOLN: INTRAVENOUS | Status: DC

## 2024-01-08 MED ORDER — SODIUM CHLORIDE 0.9 % IV SOLN
350.0000 mg | Freq: Once | INTRAVENOUS | Status: AC
  Administered 2024-01-08: 350 mg via INTRAVENOUS
  Filled 2024-01-08: qty 7

## 2024-01-08 NOTE — Patient Instructions (Addendum)
 [facility] - A DEPT OF [facility].  H[facility]PITAL  Discharge Instructions: Thank you for choosing [facility] to provide your oncology and hematology care.   If you have a lab appointment with the Cancer Center, please go directly to the Cancer Center and check in at the registration area.   Wear comfortable clothing and clothing appropriate for easy access to any Portacath or PICC line.   We strive to give you quality time with your provider. You may need to reschedule your appointment if you arrive late (15 or more minutes).  Arriving late affects you and other patients whose appointments are after yours.  Also, if you miss three or more appointments without notifying the office, you may be dismissed from the clinic at the provider's discretion.      For prescription refill requests, have your pharmacy contact our office and allow 72 hours for refills to be completed.    Today you received the following chemotherapy and/or immunotherapy agent: Cemiplimab  (Libtayo )   To help prevent nausea and vomiting after your treatment, we encourage you to take your nausea medication as directed.  BELOW ARE SYMPTOMS THAT SHOULD BE REPORTED IMMEDIATELY: *FEVER GREATER THAN 100.4 F (38 C) OR HIGHER *CHILLS OR SWEATING *NAUSEA AND VOMITING THAT IS NOT CONTROLLED WITH YOUR NAUSEA MEDICATION *UNUSUAL SHORTNESS OF BREATH *UNUSUAL BRUISING OR BLEEDING *URINARY PROBLEMS (pain or burning when urinating, or frequent urination) *BOWEL PROBLEMS (unusual diarrhea, constipation, pain near the anus) TENDERNESS IN MOUTH AND THROAT WITH OR WITHOUT PRESENCE OF ULCERS (sore throat, sores in mouth, or a toothache) UNUSUAL RASH, SWELLING OR PAIN  UNUSUAL VAGINAL DISCHARGE OR ITCHING   Items with * indicate a potential emergency and should be followed up as soon as possible or go to the Emergency Department if any problems should occur.  Please show the CHEMOTHERAPY ALERT CARD or  IMMUNOTHERAPY ALERT CARD at check-in to the Emergency Department and triage nurse.  Should you have questions after your visit or need to cancel or reschedule your appointment, please contact [facility]  Dept: [phone]  and follow the prompts.  Office hours are 8:00 a.m. to 4:30 p.m. Monday - Friday. Please note that voicemails left after 4:00 p.m. may not be returned until the following business day.  We are closed weekends and major holidays. You have access to a nurse at all times for urgent questions. Please call the main number to the clinic Dept: [phone] and follow the prompts.   For any non-urgent questions, you may also contact your provider using MyChart. We now offer e-Visits for anyone 37 and older to request care online for non-urgent symptoms. For details visit [email or web address].   Also download the MyChart app! Go to the app store, search "MyChart", open the app, select [facility], and log in with your MyChart username and password.

## 2024-01-08 NOTE — Assessment & Plan Note (Signed)
 Experiences severe pruritus associated with skin lesions. Current topical treatments, including triamcinolone cream, provide limited relief. Oral antihistamines like Benadryl are not recommended due to drowsiness and fall risk. Zyrtec is recommended for its lower drowsiness risk. - Recommend over-the-counter Zyrtec for pruritus

## 2024-01-08 NOTE — Assessment & Plan Note (Addendum)
 Presents with multiple diffuse skin lesions over a three-month period, following a previous excision and skin graft for squamous cell carcinoma that extended to the bone.    Lesions are widespread, itchy, and confirmed as squamous cell carcinoma through biopsy on 10/06/2023, indicating diffuse spread. The goal is disease control and prevention of further spread.   Cemiplimab , an immunotherapy, is recommended, with a better tolerance profile than chemotherapy. Treatment is administered IV every three weeks indefinitely, with monitoring for immune-mediated side effects, particularly thyroid  function.   On 12/12/2023, baseline whole-body PET scan was obtained.  It showed few scattered lower extremity dermal hypermetabolic lesions.  No abnormal uptake in the lymph nodes or other areas of metastatic soft tissue.  Discussed PET scan results with the patient and her caregiver today.  She was started on systemic immunotherapy with cemiplimab  from 12/18/2023.  Tolerating treatments well.  Labs today reveal no dose-limiting toxicities.  Will proceed with cemiplimab  dose today and continue this every 3 weeks indefinitely.  No contraindications for acitretin with current immunotherapy. Lipid and liver function tests required before initiating acitretin. Acitretin recommended by [facility] specialists and compatible with ongoing treatment.  RTC in 3 weeks for labs, follow-up and continuation of cemiplimab .

## 2024-01-08 NOTE — Progress Notes (Signed)
 Patient seen by Dr. Archie Patten Pasam today  Vitals are within treatment parameters:Yes   Labs are within treatment parameters: Yes   Treatment plan has been signed: Yes   Per physician team, Patient is ready for treatment and there are NO modifications to the treatment plan.

## 2024-01-08 NOTE — Progress Notes (Signed)
 [facility]  ONCOLOGY CLINIC PROGRESS NOTE   Patient Care Team: [name], Manley Seeds, MD as PCP - General Ann Barnacle, MD (Inactive) as Consulting Physician (Urology) Jorizzo, Laurine Pore, MD as Referring Physician (Dermatology) Dema Filler, MD as Consulting Physician (Ophthalmology) Deneise Finlay, MD (Dermatology)  PATIENT NAME: Linda Mcgrath   MR#: [id number] DOB: [birth year]  Date of visit: 01/08/2024   ASSESSMENT & PLAN:   Linda Mcgrath is a 89 y.o.  pleasant lady with a past medical history of dyslipidemia, hypothyroidism, colon polyps, was referred to our clinic in March 2025 for recurrent squamous of carcinoma of the skin.  She was started on cemiplimab  from 12/18/2023.  Recurrent squamous cell carcinoma of skin Presents with multiple diffuse skin lesions over a three-month period, following a previous excision and skin graft for squamous cell carcinoma that extended to the bone.    Lesions are widespread, itchy, and confirmed as squamous cell carcinoma through biopsy on 10/06/2023, indicating diffuse spread. The goal is disease control and prevention of further spread.   Cemiplimab , an immunotherapy, is recommended, with a better tolerance profile than chemotherapy. Treatment is administered IV every three weeks indefinitely, with monitoring for immune-mediated side effects, particularly thyroid  function.   On 12/12/2023, baseline whole-body PET scan was obtained.  It showed few scattered lower extremity dermal hypermetabolic lesions.  No abnormal uptake in the lymph nodes or other areas of metastatic soft tissue.  Discussed PET scan results with the patient and her caregiver today.  She was started on systemic immunotherapy with cemiplimab  from 12/18/2023.  Tolerating treatments well.  Labs today reveal no dose-limiting toxicities.  Will proceed with cemiplimab  dose today and continue this every 3 weeks indefinitely.  No contraindications  for acitretin with current immunotherapy. Lipid and liver function tests required before initiating acitretin. Acitretin recommended by [facility] specialists and compatible with ongoing treatment.  RTC in 3 weeks for labs, follow-up and continuation of cemiplimab .  Pruritus, unspecified Experiences severe pruritus associated with skin lesions. Current topical treatments, including triamcinolone  cream, provide limited relief. Oral antihistamines like Benadryl are not recommended due to drowsiness and fall risk. Zyrtec is recommended for its lower drowsiness risk. - Recommend over-the-counter Zyrtec for pruritus   I reviewed lab results and outside records for this visit and discussed relevant results with the patient. Diagnosis, plan of care and treatment options were also discussed in detail with the patient. Opportunity provided to ask questions and answers provided to her apparent satisfaction. Provided instructions to call our clinic with any problems, questions or concerns prior to return visit. I recommended to continue follow-up with PCP and sub-specialists. She verbalized understanding and agreed with the plan.   NCCN guidelines have been consulted in the planning of this patient's care.  I spent a total of 30 minutes during this encounter with the patient including review of chart and various tests results, discussions about plan of care and coordination of care plan.   Arlo Berber, MD  01/08/2024 2:25 PM  [facility] - A DEPT OF [facility] [address] [place] [address] Dept: [phone] Dept Fax: [phone]    CHIEF COMPLAINT/ REASON FOR VISIT:   Recurrent squamous cell carcinoma of the skin.  Current Treatment: Cemiplimab  started from 12/18/2023.  INTERVAL HISTORY:    Discussed the use of AI scribe software for clinical note transcription with the patient, who gave verbal consent to  proceed.  History of Present Illness Chauntell Credit Mcgrath is an 89 year old female who presents with extensive skin growths.  She has extensive skin growths that are severe and unlike anything previously seen by her healthcare providers. Recently, four lesions were removed from her legs, but she continues to experience significant itching despite treatment.  She uses a cream prescribed by Dr.Paci, which provides partial relief but requires application three to four times daily.  She has been in contact with [facility] for further recommendations, and they suggested the use of acitretin.  Recent lab work includes lipid and liver panels.  No trouble breathing or chest pain.   I have reviewed the past medical history, past surgical history, social history and family history with the patient and they are unchanged from previous note.  HISTORY OF PRESENT ILLNESS:   ONCOLOGY HISTORY:   She was first diagnosed with squamous cell carcinoma of the skin on dorsal aspect of left hand in September 2024 and underwent Mohs surgery followed by full-thickness skin graft repair in October 2024.  This healed well.   Later on she was found to have multiple skin lesions scattered all across her extremities, torso, which were felt to be squamous cell carcinomas.  She did have biopsies on 10/06/2023 from left lower leg, left forearm and right forearm all of which showed well-differentiated squamous cell carcinomas.   At this point, her dermatologist Dr. Fain Home sent a referral to us  for consideration of systemic treatment with cemiplimab  given recurrent squamous cell carcinoma of the skin.    Multiple diffuse skin lesions over a three-month period, following a previous excision and skin graft for squamous cell carcinoma that extended to the bone.    Lesions are widespread, itchy, and confirmed as squamous cell carcinoma through biopsy on 10/06/2023, indicating diffuse spread. The goal is disease control and  prevention of further spread.   Cemiplimab , an immunotherapy, is recommended, with a better tolerance profile than chemotherapy. Treatment is administered IV every three weeks indefinitely, with monitoring for immune-mediated side effects, particularly thyroid  function.   On 12/12/2023, baseline whole-body PET scan was obtained.  It showed few scattered lower extremity dermal hypermetabolic lesions.  No abnormal uptake in the lymph nodes or other areas of metastatic soft tissue.  She was started on cemiplimab  from 12/18/2023.   Experiences severe pruritus associated with skin lesions. Current topical treatments, including triamcinolone  cream, provide limited relief. Oral antihistamines like Benadryl are not recommended due to drowsiness and fall risk. Zyrtec is recommended for its lower drowsiness risk.     Oncology History  Recurrent squamous cell carcinoma of skin  10/22/2023 Initial Diagnosis   Recurrent squamous cell carcinoma of skin   12/18/2023 -  Chemotherapy   Patient is on Treatment Plan : ADVANCED CUTANEOUS SQUAMOUS CELL CARCINOMA Cemiplimab  q21d         REVIEW OF SYSTEMS:   Review of Systems - Oncology  All other pertinent systems were reviewed with the patient and are negative.  ALLERGIES: She is allergic to shellfish allergy, tetracycline, amlodipine besylate, ciprofloxacin, codeine, propoxyphene hcl, sulfamethoxazole-trimethoprim, tape, xarelto  [rivaroxaban ], amoxicillin, other, and penicillins.  MEDICATIONS:  Current Outpatient Medications  Medication Sig Dispense Refill   cholestyramine  (QUESTRAN ) 4 g packet MIX AND DRINK 1 PACKET DAILY 90 each 1   fluticasone  (FLONASE ) 50 MCG/ACT nasal spray Place 1 spray into both nostrils daily for 3 days. 16 g 0   levothyroxine  (SYNTHROID ) 100 MCG tablet Take 1 tablet (100 mcg total) by mouth daily. 30 tablet 1  ondansetron  (ZOFRAN ) 8 MG tablet Take 1 tablet (8 mg total) by mouth every 8 (eight) hours as needed for nausea or  vomiting. 30 tablet 1   prochlorperazine  (COMPAZINE ) 10 MG tablet Take 1 tablet (10 mg total) by mouth every 6 (six) hours as needed for nausea or vomiting. 30 tablet 1   solifenacin  (VESICARE ) 10 MG tablet Take 1 tablet (10 mg total) by mouth daily. 90 tablet 1   triamcinolone  ointment (KENALOG ) 0.1 % Apply 1 Application topically daily as needed. 453 g 2   acitretin (SORIATANE) 10 MG capsule Take 1 capsule (10 mg total) by mouth daily before breakfast. (Patient not taking: Reported on 01/08/2024) 30 capsule 1   No current facility-administered medications for this visit.   Facility-Administered Medications Ordered in Other Visits  Medication Dose Route Frequency Provider Last Rate Last Admin   0.9 %  sodium chloride  infusion   Intravenous Continuous Kareem Aul, MD   Stopped at 01/08/24 1400     VITALS:   Blood pressure 139/78, pulse 86, temperature 98.1 F (36.7 C), temperature source Temporal, resp. rate 16, height 5\' 2"  (1.575 m), weight 144 lb 5 oz (65.5 kg), last menstrual period 08/26/1969, SpO2 97%.  Wt Readings from Last 3 Encounters:  01/08/24 144 lb 5 oz (65.5 kg)  12/18/23 147 lb 9.6 oz (67 kg)  11/20/23 149 lb 6.4 oz (67.8 kg)    Body mass index is 26.4 kg/m.     Onc Performance Status - 01/08/24 1143       ECOG Perf Status   ECOG Perf Status Restricted in physically strenuous activity but ambulatory and able to carry out work of a light or sedentary nature, e.g., light house work, office work      KPS SCALE   KPS % SCORE Able to carry on normal activity, minor s/s of disease              PHYSICAL EXAM:   Physical Exam Constitutional:      General: She is not in acute distress.    Appearance: Normal appearance.  HENT:     Head: Normocephalic and atraumatic.  Eyes:     General: No scleral icterus.    Conjunctiva/sclera: Conjunctivae normal.  Cardiovascular:     Rate and Rhythm: Normal rate and regular rhythm.     Heart sounds: Normal heart  sounds.  Pulmonary:     Effort: Pulmonary effort is normal.     Breath sounds: Normal breath sounds.  Abdominal:     General: There is no distension.  Musculoskeletal:     Right lower leg: No edema.     Left lower leg: No edema.  Skin:    Comments:  Multiple skin lesions throughout extremities, back and torso, consistent with squamous cell skin cancers.  Some with excoriations.   Neurological:     General: No focal deficit present.     Mental Status: She is alert and oriented to person, place, and time.  Psychiatric:        Mood and Affect: Mood normal.        Behavior: Behavior normal.        Thought Content: Thought content normal.      LABORATORY DATA:   I have reviewed the data as listed.  Results for orders placed or performed in visit on 01/08/24  Lipid panel  Result Value Ref Range   Cholesterol 169 0 - 200 mg/dL   Triglycerides 40 <657 mg/dL   HDL 61 >84  mg/dL   Total CHOL/HDL Ratio 2.8 RATIO   VLDL 8 0 - 40 mg/dL   LDL Cholesterol 147 (H) 0 - 99 mg/dL  Results for orders placed or performed in visit on 01/08/24  CMP (Cancer Center only)  Result Value Ref Range   Sodium 142 135 - 145 mmol/L   Potassium 4.0 3.5 - 5.1 mmol/L   Chloride 111 98 - 111 mmol/L   CO2 25 22 - 32 mmol/L   Glucose, Bld 95 70 - 99 mg/dL   BUN 18 8 - 23 mg/dL   Creatinine 8.29 (H) 5.62 - 1.00 mg/dL   Calcium  9.2 8.9 - 10.3 mg/dL   Total Protein 6.9 6.5 - 8.1 g/dL   Albumin 4.1 3.5 - 5.0 g/dL   AST 14 (L) 15 - 41 U/L   ALT 12 0 - 44 U/L   Alkaline Phosphatase 56 38 - 126 U/L   Total Bilirubin 1.2 0.0 - 1.2 mg/dL   GFR, Estimated 52 (L) >60 mL/min   Anion gap 6 5 - 15  CBC with Differential (Cancer Center Only)  Result Value Ref Range   WBC Count 6.7 4.0 - 10.5 K/uL   RBC 4.40 3.87 - 5.11 MIL/uL   Hemoglobin 14.0 12.0 - 15.0 g/dL   HCT 13.0 86.5 - 78.4 %   MCV 94.5 80.0 - 100.0 fL   MCH 31.8 26.0 - 34.0 pg   MCHC 33.7 30.0 - 36.0 g/dL   RDW 69.6 29.5 - 28.4 %   Platelet Count  229 150 - 400 K/uL   nRBC 0.0 0.0 - 0.2 %   Neutrophils Relative % 52 %   Neutro Abs 3.4 1.7 - 7.7 K/uL   Lymphocytes Relative 32 %   Lymphs Abs 2.2 0.7 - 4.0 K/uL   Monocytes Relative 9 %   Monocytes Absolute 0.6 0.1 - 1.0 K/uL   Eosinophils Relative 6 %   Eosinophils Absolute 0.4 0.0 - 0.5 K/uL   Basophils Relative 1 %   Basophils Absolute 0.1 0.0 - 0.1 K/uL   Immature Granulocytes 0 %   Abs Immature Granulocytes 0.01 0.00 - 0.07 K/uL      RADIOGRAPHIC STUDIES:  I have personally reviewed the radiological images as listed and agree with the findings in the report.  NM PET Image Initial (PI) Whole Body Result Date: 12/15/2023 CLINICAL DATA:  Initial treatment strategy for squamous cell carcinoma of the skin. Diffuse lesions. EXAM: NUCLEAR MEDICINE PET WHOLE BODY TECHNIQUE: 7.4 mCi F-18 FDG was injected intravenously. Full-ring PET imaging was performed from the head to foot after the radiotracer. CT data was obtained and used for attenuation correction and anatomic localization. Fasting blood glucose: 93 mg/dl COMPARISON:  None Available. FINDINGS: Mediastinal blood pool activity: SUV max 4.1 HEAD/NECK: No specific abnormal radiotracer uptake identified including along the lymph node change of the submandibular, posterior triangle internal jugular region. Exception is some prominent uptake along thyroid  gland diffusely maximum SUV of 10.9. Please correlate clinical findings. The thyroid  gland is not appear to be enlarged. Near symmetric uptake of the intracranial compartment. Incidental CT findings: Streak artifact related to the patient's dental hardware. The visualized paranasal sinuses and mastoid air cells are clear. The parotid glands, submandibular glands thyroid  glands unremarkable. CHEST: There is some prominent uptake along the loculated area of fat in the mediastinum between the SVC and the left atrium. Maximum SUV value of 10.0. There is also some mild uptake along extrapleural fat  along the posterior aspect of  the thorax. Maximum SUV of 5.0. No other areas of abnormal uptake in the thorax including along lymph node chains in the axillary region, hilum or mediastinum. No specific areas of abnormal lung uptake. Incidental CT findings: Scattered vascular calcifications are seen along the aorta and coronary vessels. Heart is nonenlarged. No pericardial effusion. There is some mild pleural thickening at the left lung base with some presumed atelectasis dependently. Breathing motion. There is some volume loss along left hemithorax compared to right. There is a noncalcified left upper lobe nodule which is not show any abnormal uptake but is stable going back to a CT scan 2017 demonstrating long-term stability of 9 mm. ABDOMEN/PELVIS: Physiologic distribution radiotracer along the renal collecting systems and bowel. There is some mild uptake along area of nodularity in left adrenal gland with maximum SUV value of 5.2. Area measures 12 mm on CT image 112. Hounsfield unit of the area of 2.2. Small adenoma. Incidental CT findings: Previous cholecystectomy. Left hepatic lobe small cyst. No obstructing renal stones. Contracted urinary bladder. Large bowel is nondilated with scattered stool. Left-sided colonic diverticula. No free air or free fluid. Skeleton/extremities: No abnormal uptake along the visualized osseous structures. There are some small areas of dermal nodularity with uptake identified along the lower extremities. Example posteriorly about the left calf has small focus on image 298 with maximum SUV of 3.5. Focus posterior aspect of the right leg medially at the level of the distal femoral metaphysis has maximum SUV value of 5.6 on image 257. Additional focus posterior along the left calf on image 320. Few other small foci elsewhere along the lower extremities. Please correlate for location of dermal metastatic lesions. IMPRESSION: Few scattered lower extremity dermal hypermetabolic lesions.  Please correlate for clinical findings of location of neoplasm. No specific abnormal uptake along lymph nodes or other areas of metastatic soft tissue at this time. There is uptake along posterior thoracic extrapleural fat as well as some uptake along the fat in the mediastinum on the right side, likely physiologic/brown fat. Electronically Signed   By: Adrianna Horde M.D.   On: 12/15/2023 17:41    CODE STATUS:  Code Status History     Date Active Date Inactive Code Status Order ID Comments User Context   07/31/2017 1836 08/05/2017 1524 Full Code [id number]  Isadora Mar, MD Inpatient   04/03/2016 1258 04/03/2016 2124 Full Code [id number]  Lei Pump, MD Inpatient   03/17/2016 1726 03/18/2016 1457 Full Code [id number]  Nohemi Batters, NP ED   03/06/2016 1903 03/08/2016 1529 Full Code [id number]  Nohemi Batters, NP ED       Orders Placed This Encounter  Procedures   Lipid panel    Standing Status:   Future    Number of Occurrences:   1    Expiration Date:   01/07/2025     Future Appointments  Date Time Provider Department Center  01/13/2024 10:30 AM [facility] LAB [facility]  01/29/2024 10:00 AM [facility] LAB [facility] None  01/29/2024 10:30 AM Chrisha Vogel, MD [facility] None  01/29/2024 11:15 AM [facility] INFUSION [facility] None  02/03/2024  1:45 PM Deneise Finlay, MD [facility] None  02/19/2024 10:30 AM [facility] LAB [facility] None  02/19/2024 11:00 AM Korina Tretter, MD [facility] None  02/19/2024 11:45 AM [facility] INFUSION [facility] None  03/11/2024  9:30 AM [facility] LAB [facility] None  03/11/2024 10:00 AM Sheralee Qazi, MD [facility] None  03/11/2024 10:30 AM [facility] INFUSION [facility] None  10/01/2024  1:40 PM [facility] ANNUAL WELLNESS  VISIT 1 [facility] PEC      This document was completed utilizing speech recognition software. Grammatical errors, random word insertions, pronoun errors, and incomplete sentences are an occasional consequence of this  system due to software limitations, ambient noise, and hardware issues. Any formal questions or concerns about the content, text or information contained within the body of this dictation should be directly addressed to the provider for clarification.

## 2024-01-09 ENCOUNTER — Ambulatory Visit: Payer: Self-pay | Admitting: Dermatology

## 2024-01-12 NOTE — Telephone Encounter (Signed)
 Spoke with pt gave her test results and recommendations

## 2024-01-12 NOTE — Telephone Encounter (Signed)
-----   Message from [facility] sent at 01/09/2024 10:07 AM EDT ----- Patient is safe to start acitretin  based on lipid function labs; she should start taking 1 x 10 mg daily

## 2024-01-13 ENCOUNTER — Other Ambulatory Visit (INDEPENDENT_AMBULATORY_CARE_PROVIDER_SITE_OTHER)

## 2024-01-13 ENCOUNTER — Ambulatory Visit: Payer: Self-pay | Admitting: Family Medicine

## 2024-01-13 DIAGNOSIS — E039 Hypothyroidism, unspecified: Secondary | ICD-10-CM

## 2024-01-13 LAB — TSH: TSH: 2.25 u[IU]/mL (ref 0.35–5.50)

## 2024-01-20 MED ORDER — LEVOTHYROXINE SODIUM 100 MCG PO TABS: 100.0000 ug | ORAL_TABLET | Freq: Every day | ORAL | 2 refills | Status: DC

## 2024-01-20 NOTE — Telephone Encounter (Signed)
 Pt notified of results and Dr. Belva Boyden comments and Rx sent to pharmacy

## 2024-01-20 NOTE — Telephone Encounter (Signed)
-----   Message from [facility] sent at 01/13/2024  7:43 PM EDT ----- TSH is back into the normal range Continue the 100 mg levothyroxine   I pended a 90 day supply but unsure what pharmacy to send this one to (was [facility] but [facility] is also listed) Please send to pharmacy of choice

## 2024-01-21 ENCOUNTER — Other Ambulatory Visit: Payer: Self-pay

## 2024-01-21 DIAGNOSIS — C4492 Squamous cell carcinoma of skin, unspecified: Secondary | ICD-10-CM

## 2024-01-21 NOTE — Progress Notes (Signed)
 US  ordered for bilateral lower extremeties due to acute redness to each side with swelling to left calf per acquaintance report. Next appt with Dr. Randye Buttner is on 06/05.

## 2024-01-21 NOTE — Progress Notes (Signed)
 STAT US  entered for bilateral lower extremities.

## 2024-01-22 ENCOUNTER — Ambulatory Visit (HOSPITAL_COMMUNITY)
Admission: RE | Admit: 2024-01-22 | Discharge: 2024-01-22 | Disposition: A | Discharge status: Home / Self Care | Source: Ambulatory Visit | Attending: Vascular Surgery | Admitting: Vascular Surgery

## 2024-01-22 DIAGNOSIS — C4492 Squamous cell carcinoma of skin, unspecified: Secondary | ICD-10-CM

## 2024-01-27 ENCOUNTER — Ambulatory Visit: Payer: Self-pay

## 2024-01-27 NOTE — Telephone Encounter (Signed)
 Based on triage/symptom I agree with Er disposition

## 2024-01-27 NOTE — Telephone Encounter (Signed)
 Copied from CRM [id number]. Topic: Clinical - Red Word Triage >> Jan 27, 2024  8:20 AM Adonis Hoot wrote: Red Word that prompted transfer to Nurse Triage: swelling in both legs,left leg larger  Chief Complaint: bilateral legs swollen and "look like they are going to pop", reddened; Symptoms: painful swelling Frequency: constant Pertinent Negatives: Patient denies na Disposition: [x] ED /[] Urgent Care (no appt availability in office) / [] Appointment(In office/virtual)/ []  [facility]/ [] Home Care/ [] Refused Recommended Disposition /[] [facility]/ []  Follow-up with PCP Additional Notes: instructed to go to ER.  Wants to speak with Dr. Malissa Se first.  Reason for Disposition  Sounds like a life-threatening emergency to the triager  Answer Assessment - Initial Assessment Questions 1. ONSET: "When did the swelling start?" (e.g., minutes, hours, days)     Left greater than right, started about a week ago; oncologist sent her for u/s and was negative for DVT 2. LOCATION: "What part of the leg is swollen?"  "Are both legs swollen or just one leg?"     Both legs, severely swollen 3. SEVERITY: "How bad is the swelling?" (e.g., localized; mild, moderate, severe)   - Localized: Small area of swelling localized to one leg.   - MILD pedal edema: Swelling limited to foot and ankle, pitting edema < 1/4 inch (6 mm) deep, rest and elevation eliminate most or all swelling.   - MODERATE edema: Swelling of lower leg to knee, pitting edema > 1/4 inch (6 mm) deep, rest and elevation only partially reduce swelling.   - SEVERE edema: Swelling extends above knee, facial or hand swelling present.      Severe, states skin looks like its going to pop 4. REDNESS: "Does the swelling look red or infected?"     red 5. PAIN: "Is the swelling painful to touch?" If Yes, ask: "How painful is it?"   (Scale 1-10; mild, moderate or severe)     To ER 6. FEVER: "Do you have a fever?" If Yes, ask: "What is it, how was  it measured, and when did it start?"      To ER 7. CAUSE: "What do you think is causing the leg swelling?"     TO ER 8. MEDICAL HISTORY: "Do you have a history of blood clots (e.g., DVT), cancer, heart failure, kidney disease, or liver failure?"      9. RECURRENT SYMPTOM: "Have you had leg swelling before?" If Yes, ask: "When was the last time?" "What happened that time?"      10. OTHER SYMPTOMS: "Do you have any other symptoms?" (e.g., chest pain, difficulty breathing)        11. PREGNANCY: "Is there any chance you are pregnant?" "When was your last menstrual period?"  Protocols used: Leg Swelling and Edema-A-AH

## 2024-01-28 NOTE — Telephone Encounter (Signed)
 Attempted to call & check on the Patient no answer.

## 2024-01-29 ENCOUNTER — Inpatient Hospital Stay (HOSPITAL_BASED_OUTPATIENT_CLINIC_OR_DEPARTMENT_OTHER): Admitting: Oncology

## 2024-01-29 ENCOUNTER — Inpatient Hospital Stay

## 2024-01-29 ENCOUNTER — Other Ambulatory Visit: Payer: Self-pay

## 2024-01-29 ENCOUNTER — Inpatient Hospital Stay: Attending: Oncology

## 2024-01-29 VITALS — BP 124/66 | HR 94 | Temp 97.6°F | Resp 17 | Ht 62.0 in | Wt 143.1 lb

## 2024-01-29 DIAGNOSIS — L299 Pruritus, unspecified: Secondary | ICD-10-CM | POA: Insufficient documentation

## 2024-01-29 DIAGNOSIS — Z5112 Encounter for antineoplastic immunotherapy: Secondary | ICD-10-CM | POA: Insufficient documentation

## 2024-01-29 DIAGNOSIS — R21 Rash and other nonspecific skin eruption: Secondary | ICD-10-CM | POA: Insufficient documentation

## 2024-01-29 DIAGNOSIS — Z79620 Long term (current) use of immunosuppressive biologic: Secondary | ICD-10-CM | POA: Insufficient documentation

## 2024-01-29 DIAGNOSIS — C4492 Squamous cell carcinoma of skin, unspecified: Secondary | ICD-10-CM

## 2024-01-29 DIAGNOSIS — L03119 Cellulitis of unspecified part of limb: Secondary | ICD-10-CM | POA: Insufficient documentation

## 2024-01-29 DIAGNOSIS — C44629 Squamous cell carcinoma of skin of left upper limb, including shoulder: Secondary | ICD-10-CM | POA: Insufficient documentation

## 2024-01-29 LAB — CMP (CANCER CENTER ONLY)
ALT: 10 U/L (ref 0–44)
AST: 15 U/L (ref 15–41)
Albumin: 3.7 g/dL (ref 3.5–5.0)
Alkaline Phosphatase: 53 U/L (ref 38–126)
Anion gap: 7 (ref 5–15)
BUN: 18 mg/dL (ref 8–23)
CO2: 27 mmol/L (ref 22–32)
Calcium: 9 mg/dL (ref 8.9–10.3)
Chloride: 107 mmol/L (ref 98–111)
Creatinine: 1.06 mg/dL — ABNORMAL HIGH (ref 0.44–1.00)
GFR, Estimated: 51 mL/min — ABNORMAL LOW (ref >60)
Glucose, Bld: 107 mg/dL — ABNORMAL HIGH (ref 70–99)
Potassium: 3.9 mmol/L (ref 3.5–5.1)
Sodium: 141 mmol/L (ref 135–145)
Total Bilirubin: 0.8 mg/dL (ref 0.0–1.2)
Total Protein: 6.8 g/dL (ref 6.5–8.1)

## 2024-01-29 LAB — CBC WITH DIFFERENTIAL (CANCER CENTER ONLY)
Abs Immature Granulocytes: 0.13 10*3/uL — ABNORMAL HIGH (ref 0.00–0.07)
Basophils Absolute: 0.1 10*3/uL (ref 0.0–0.1)
Basophils Relative: 1 %
Eosinophils Absolute: 1 10*3/uL — ABNORMAL HIGH (ref 0.0–0.5)
Eosinophils Relative: 9 %
HCT: 41.6 % (ref 36.0–46.0)
Hemoglobin: 13.9 g/dL (ref 12.0–15.0)
Immature Granulocytes: 1 %
Lymphocytes Relative: 19 %
Lymphs Abs: 2 10*3/uL (ref 0.7–4.0)
MCH: 31.4 pg (ref 26.0–34.0)
MCHC: 33.4 g/dL (ref 30.0–36.0)
MCV: 94.1 fL (ref 80.0–100.0)
Monocytes Absolute: 1.2 10*3/uL — ABNORMAL HIGH (ref 0.1–1.0)
Monocytes Relative: 11 %
Neutro Abs: 5.8 10*3/uL (ref 1.7–7.7)
Neutrophils Relative %: 59 %
Platelet Count: 317 10*3/uL (ref 150–400)
RBC: 4.42 MIL/uL (ref 3.87–5.11)
RDW: 12 % (ref 11.5–15.5)
WBC Count: 10.1 10*3/uL (ref 4.0–10.5)
nRBC: 0 % (ref 0.0–0.2)

## 2024-01-29 LAB — TSH: TSH: 0.797 u[IU]/mL (ref 0.350–4.500)

## 2024-01-29 MED ORDER — SODIUM CHLORIDE 0.9 % IV SOLN: INTRAVENOUS | Status: DC

## 2024-01-29 MED ORDER — CIPROFLOXACIN HCL 250 MG PO TABS: 250.0000 mg | ORAL_TABLET | Freq: Two times a day (BID) | ORAL | 0 refills | Status: DC

## 2024-01-29 MED ORDER — SODIUM CHLORIDE 0.9 % IV SOLN
350.0000 mg | Freq: Once | INTRAVENOUS | Status: AC
  Administered 2024-01-29: 350 mg via INTRAVENOUS
  Filled 2024-01-29: qty 7

## 2024-01-29 NOTE — Patient Instructions (Signed)
 [facility] - A DEPT OF [facility]  Discharge Instructions: Thank you for choosing [facility] to provide your oncology and hematology care.   If you have a lab appointment with the Cancer Center, please go directly to the Cancer Center and check in at the registration area.   Wear comfortable clothing and clothing appropriate for easy access to any Portacath or PICC line.   We strive to give you quality time with your provider. You may need to reschedule your appointment if you arrive late (15 or more minutes).  Arriving late affects you and other patients whose appointments are after yours.  Also, if you miss three or more appointments without notifying the office, you may be dismissed from the clinic at the provider's discretion.      For prescription refill requests, have your pharmacy contact our office and allow 72 hours for refills to be completed.    Today you received the following chemotherapy and/or immunotherapy agents: Libtayo.       To help prevent nausea and vomiting after your treatment, we encourage you to take your nausea medication as directed.  BELOW ARE SYMPTOMS THAT SHOULD BE REPORTED IMMEDIATELY: *FEVER GREATER THAN 100.4 F (38 C) OR HIGHER *CHILLS OR SWEATING *NAUSEA AND VOMITING THAT IS NOT CONTROLLED WITH YOUR NAUSEA MEDICATION *UNUSUAL SHORTNESS OF BREATH *UNUSUAL BRUISING OR BLEEDING *URINARY PROBLEMS (pain or burning when urinating, or frequent urination) *BOWEL PROBLEMS (unusual diarrhea, constipation, pain near the anus) TENDERNESS IN MOUTH AND THROAT WITH OR WITHOUT PRESENCE OF ULCERS (sore throat, sores in mouth, or a toothache) UNUSUAL RASH, SWELLING OR PAIN  UNUSUAL VAGINAL DISCHARGE OR ITCHING   Items with * indicate a potential emergency and should be followed up as soon as possible or go to the Emergency Department if any problems should occur.  Please show the CHEMOTHERAPY ALERT CARD or IMMUNOTHERAPY  ALERT CARD at check-in to the Emergency Department and triage nurse.  Should you have questions after your visit or need to cancel or reschedule your appointment, please contact [facility] - A DEPT OF [facility]  Dept: [phone]  and follow the prompts.  Office hours are 8:00 a.m. to 4:30 p.m. Monday - Friday. Please note that voicemails left after 4:00 p.m. may not be returned until the following business day.  We are closed weekends and major holidays. You have access to a nurse at all times for urgent questions. Please call the main number to the clinic Dept: [phone] and follow the prompts.   For any non-urgent questions, you may also contact your provider using MyChart. We now offer e-Visits for anyone 71 and older to request care online for non-urgent symptoms. For details visit [email or web address].   Also download the MyChart app! Go to the app store, search "MyChart", open the app, select [facility], and log in with your MyChart username and password.

## 2024-01-29 NOTE — Progress Notes (Unsigned)
 [facility]  ONCOLOGY CLINIC PROGRESS NOTE   Patient Care Team: [name], Manley Seeds, MD as PCP - General Ann Barnacle, MD (Inactive) as Consulting Physician (Urology) Jorizzo, Laurine Pore, MD as Referring Physician (Dermatology) Dema Filler, MD as Consulting Physician (Ophthalmology) Deneise Finlay, MD (Dermatology)  PATIENT NAME: Linda Mcgrath   MR#: [id number] DOB: 03/30/1933  Date of visit: 01/29/2024   ASSESSMENT & PLAN:   TAMBERLY POMPLUN is a 89 y.o.  pleasant lady with a past medical history of dyslipidemia, hypothyroidism, colon polyps, was referred to our clinic in March 2025 for recurrent squamous of carcinoma of the skin.  She was started on cemiplimab  from 12/18/2023.  Recurrent squamous cell carcinoma of skin Presents with multiple diffuse skin lesions over a three-month period, following a previous excision and skin graft for squamous cell carcinoma that extended to the bone.    Lesions are widespread, itchy, and confirmed as squamous cell carcinoma through biopsy on 10/06/2023, indicating diffuse spread. The goal is disease control and prevention of further spread.   Cemiplimab , an immunotherapy, is recommended, with a better tolerance profile than chemotherapy. Treatment is administered IV every three weeks indefinitely, with monitoring for immune-mediated side effects, particularly thyroid  function.   On 12/12/2023, baseline whole-body PET scan was obtained.  It showed few scattered lower extremity dermal hypermetabolic lesions.  No abnormal uptake in the lymph nodes or other areas of metastatic soft tissue.  Discussed PET scan results with the patient and her caregiver previously.   She was started on systemic immunotherapy with cemiplimab  from 12/18/2023.  Tolerating treatments well.  Labs today reveal no dose-limiting toxicities.  Will proceed with cemiplimab  dose today and continue this every 3 weeks indefinitely.  No  contraindications for acitretin  with current immunotherapy. Lipid and liver function tests required before initiating acitretin . Acitretin  recommended by [facility] specialists and compatible with ongoing treatment.  RTC in 3 weeks for labs, follow-up and continuation of cemiplimab .  Erythematous rash Erythematous rash potentially related to acitretin , which can cause skin exfoliation, dry skin, and rash in 10-25% of patients. Differential includes cellulitis due to warmth and discomfort in the legs, causing significant discomfort and concern for underlying cellulitis. - Prescribe ciprofloxacin 250 mg twice daily, considering allergies and adverse reactions to other antibiotics. - Instruct to monitor for diarrhea, especially more than four loose stools a day, and to stop the antibiotic and notify if this occurs. - Advise to consume probiotics to prevent antibiotic-associated side effects. - Continue immunotherapy as planned.  Cellulitis of lower extremity Concern for cellulitis due to warmth and discomfort in the legs. Differential includes reaction to acitretin .   Recently ultrasound of the lower extremities was negative for DVT.  Ciprofloxacin was chosen despite previous adverse reactions, as these were not true allergies. - Prescribe ciprofloxacin 250 mg twice daily to address potential cellulitis. - Monitor for improvement in symptoms by the next appointment with Dr.Paci - Monitor for any adverse reactions, particularly sleep disturbances or muscle spasms.   I reviewed lab results and outside records for this visit and discussed relevant results with the patient. Diagnosis, plan of care and treatment options were also discussed in detail with the patient. Opportunity provided to ask questions and answers provided to her apparent satisfaction. Provided instructions to call our clinic with any problems, questions or concerns prior to return visit. I recommended to continue follow-up with PCP  and sub-specialists. She verbalized understanding and agreed with the plan.   NCCN guidelines have  been consulted in the planning of this patient's care.  I spent a total of 40 minutes during this encounter with the patient including review of chart and various tests results, discussions about plan of care and coordination of care plan.   Arlo Berber, MD  01/29/2024 11:08 AM  [facility] - A DEPT OF [facility] [address] [place] [address] Dept: [phone] Dept Fax: [phone]    CHIEF COMPLAINT/ REASON FOR VISIT:   Recurrent squamous cell carcinoma of the skin.  Current Treatment: Cemiplimab  started from 12/18/2023.  INTERVAL HISTORY:    Discussed the use of AI scribe software for clinical note transcription with the patient, who gave verbal consent to proceed.  History of Present Illness  Linda Mcgrath is an 89 year old female who presents with worsening leg lesions.  She is experiencing worsening leg lesions, described as bumps on her legs that have become particularly miserable. These lesions have developed since her last visit and were not present previously. She has been elevating her legs at night and several times during the day to alleviate symptoms.  She has been on immunotherapy and recently started acitretin , a medication prescribed following lab results. Her caregiver suspects that the new medication might be contributing to the skin issues.  She has a history of multiple allergies to antibiotics, including penicillin, Bactrim, tetracycline, and ciprofloxacin. Some of these reactions are described as adverse reactions rather than true allergies, such as sleep disturbances and muscle spasms.  No fever. She reports eating well and has no current side effects from the immunotherapy.   I have reviewed the past medical history, past surgical history, social history and family  history with the patient and they are unchanged from previous note.  HISTORY OF PRESENT ILLNESS:   ONCOLOGY HISTORY:   She was first diagnosed with squamous cell carcinoma of the skin on dorsal aspect of left hand in September 2024 and underwent Mohs surgery followed by full-thickness skin graft repair in October 2024.  This healed well.   Later on she was found to have multiple skin lesions scattered all across her extremities, torso, which were felt to be squamous cell carcinomas.  She did have biopsies on 10/06/2023 from left lower leg, left forearm and right forearm all of which showed well-differentiated squamous cell carcinomas.   At this point, her dermatologist Dr. Fain Home sent a referral to us  for consideration of systemic treatment with cemiplimab  given recurrent squamous cell carcinoma of the skin.    Multiple diffuse skin lesions over a three-month period, following a previous excision and skin graft for squamous cell carcinoma that extended to the bone.    Lesions are widespread, itchy, and confirmed as squamous cell carcinoma through biopsy on 10/06/2023, indicating diffuse spread. The goal is disease control and prevention of further spread.   Cemiplimab , an immunotherapy, is recommended, with a better tolerance profile than chemotherapy. Treatment is administered IV every three weeks indefinitely, with monitoring for immune-mediated side effects, particularly thyroid  function.   On 12/12/2023, baseline whole-body PET scan was obtained.  It showed few scattered lower extremity dermal hypermetabolic lesions.  No abnormal uptake in the lymph nodes or other areas of metastatic soft tissue.  She was started on cemiplimab  from 12/18/2023.   Experiences severe pruritus associated with skin lesions. Current topical treatments, including triamcinolone  cream, provide limited relief. Oral antihistamines like Benadryl are not recommended due to drowsiness and fall risk.  Zyrtec is recommended for  its lower drowsiness risk.     Oncology History  Recurrent squamous cell carcinoma of skin  10/22/2023 Initial Diagnosis   Recurrent squamous cell carcinoma of skin   12/18/2023 -  Chemotherapy   Patient is on Treatment Plan : ADVANCED CUTANEOUS SQUAMOUS CELL CARCINOMA Cemiplimab  q21d         REVIEW OF SYSTEMS:   Review of Systems - Oncology  All other pertinent systems were reviewed with the patient and are negative.  ALLERGIES: She is allergic to shellfish allergy, tetracycline, amlodipine besylate, codeine, propoxyphene hcl, sulfamethoxazole-trimethoprim, tape, xarelto  [rivaroxaban ], amoxicillin, other, and penicillins.  MEDICATIONS:  Current Outpatient Medications  Medication Sig Dispense Refill   acitretin  (SORIATANE ) 10 MG capsule Take 1 capsule (10 mg total) by mouth daily before breakfast. 30 capsule 1   alendronate  (FOSAMAX ) 70 MG tablet Take 70 mg by mouth once a week.     cholestyramine  (QUESTRAN ) 4 g packet MIX AND DRINK 1 PACKET DAILY 90 each 1   ciprofloxacin (CIPRO) 250 MG tablet Take 1 tablet (250 mg total) by mouth 2 (two) times daily. 20 tablet 0   diphenhydrAMINE (BENADRYL) 25 MG tablet Take 25 mg by mouth every 8 (eight) hours as needed for itching (Patient report she takes for itching).     GEMTESA 75 MG TABS Take 1 tablet by mouth daily.     levothyroxine  (SYNTHROID ) 100 MCG tablet Take 1 tablet (100 mcg total) by mouth daily before breakfast. 90 tablet 2   solifenacin  (VESICARE ) 10 MG tablet Take 1 tablet (10 mg total) by mouth daily. 90 tablet 1   triamcinolone  ointment (KENALOG ) 0.1 % Apply 1 Application topically daily as needed. 453 g 2   fluticasone  (FLONASE ) 50 MCG/ACT nasal spray Place 1 spray into both nostrils daily for 3 days. (Patient not taking: Reported on 01/29/2024) 16 g 0   ondansetron  (ZOFRAN ) 8 MG tablet Take 1 tablet (8 mg total) by mouth every 8 (eight) hours as needed for nausea or vomiting. (Patient not taking: Reported on 01/29/2024) 30  tablet 1   prochlorperazine  (COMPAZINE ) 10 MG tablet Take 1 tablet (10 mg total) by mouth every 6 (six) hours as needed for nausea or vomiting. (Patient not taking: Reported on 01/29/2024) 30 tablet 1   No current facility-administered medications for this visit.     VITALS:   Blood pressure 124/66, pulse 94, temperature 97.6 F (36.4 C), temperature source Temporal, resp. rate 17, height 5\' 2"  (1.575 m), weight 143 lb 1 oz (64.9 kg), last menstrual period 08/26/1969, SpO2 99%.  Wt Readings from Last 3 Encounters:  01/29/24 143 lb 1 oz (64.9 kg)  01/08/24 144 lb 5 oz (65.5 kg)  12/18/23 147 lb 9.6 oz (67 kg)    Body mass index is 26.17 kg/m.     Onc Performance Status - 01/29/24 1045       ECOG Perf Status   ECOG Perf Status Restricted in physically strenuous activity but ambulatory and able to carry out work of a light or sedentary nature, e.g., light house work, office work      KPS SCALE   KPS % SCORE Normal activity with effort, some s/s of disease              PHYSICAL EXAM:   Physical Exam Constitutional:      General: She is not in acute distress.    Appearance: Normal appearance.  HENT:     Head: Normocephalic and atraumatic.  Cardiovascular:  Rate and Rhythm: Normal rate and regular rhythm.     Heart sounds: Normal heart sounds.  Pulmonary:     Effort: Pulmonary effort is normal.     Breath sounds: Normal breath sounds.  Abdominal:     General: There is no distension.  Skin:    Comments:  Multiple skin lesions throughout extremities, back and torso, consistent with squamous cell skin cancers.  Some with excoriations. ?  Cellulitis component  Neurological:     General: No focal deficit present.     Mental Status: She is alert and oriented to person, place, and time.  Psychiatric:        Mood and Affect: Mood normal.        Behavior: Behavior normal.      LABORATORY DATA:   I have reviewed the data as listed.  Results for orders placed or  performed in visit on 01/29/24  CMP (Cancer Center only)  Result Value Ref Range   Sodium 141 135 - 145 mmol/L   Potassium 3.9 3.5 - 5.1 mmol/L   Chloride 107 98 - 111 mmol/L   CO2 27 22 - 32 mmol/L   Glucose, Bld 107 (H) 70 - 99 mg/dL   BUN 18 8 - 23 mg/dL   Creatinine 4.09 (H) 8.11 - 1.00 mg/dL   Calcium  9.0 8.9 - 10.3 mg/dL   Total Protein 6.8 6.5 - 8.1 g/dL   Albumin 3.7 3.5 - 5.0 g/dL   AST 15 15 - 41 U/L   ALT 10 0 - 44 U/L   Alkaline Phosphatase 53 38 - 126 U/L   Total Bilirubin 0.8 0.0 - 1.2 mg/dL   GFR, Estimated 51 (L) >60 mL/min   Anion gap 7 5 - 15  CBC with Differential (Cancer Center Only)  Result Value Ref Range   WBC Count 10.1 4.0 - 10.5 K/uL   RBC 4.42 3.87 - 5.11 MIL/uL   Hemoglobin 13.9 12.0 - 15.0 g/dL   HCT 91.4 78.2 - 95.6 %   MCV 94.1 80.0 - 100.0 fL   MCH 31.4 26.0 - 34.0 pg   MCHC 33.4 30.0 - 36.0 g/dL   RDW 21.3 08.6 - 57.8 %   Platelet Count 317 150 - 400 K/uL   nRBC 0.0 0.0 - 0.2 %   Neutrophils Relative % 59 %   Neutro Abs 5.8 1.7 - 7.7 K/uL   Lymphocytes Relative 19 %   Lymphs Abs 2.0 0.7 - 4.0 K/uL   Monocytes Relative 11 %   Monocytes Absolute 1.2 (H) 0.1 - 1.0 K/uL   Eosinophils Relative 9 %   Eosinophils Absolute 1.0 (H) 0.0 - 0.5 K/uL   Basophils Relative 1 %   Basophils Absolute 0.1 0.0 - 0.1 K/uL   Immature Granulocytes 1 %   Abs Immature Granulocytes 0.13 (H) 0.00 - 0.07 K/uL      RADIOGRAPHIC STUDIES:  I have personally reviewed the radiological images as listed and agree with the findings in the report.  VAS US  LOWER EXTREMITY VENOUS (DVT) Result Date: 01/22/2024  Lower Venous DVT Study Patient Name:  OLEDA BORSKI  Date of Exam:   01/22/2024 Medical Rec #: [id number]                 Accession #:    [id number] Date of Birth: 1933-01-16                  Patient Gender: F Patient Age:   89 years Exam Location:  [address] Procedure:      VAS US  LOWER EXTREMITY VENOUS (DVT) Referring Phys: Gale Jude Morgin Halls  --------------------------------------------------------------------------------  Indications: Edema, and Erythema.  Comparison Study: No prior study Performing Technologist: Delford Felling MHA, RDMS, RVT, RDCS  Examination Guidelines: A complete evaluation includes B-mode imaging, spectral Doppler, color Doppler, and power Doppler as needed of all accessible portions of each vessel. Bilateral testing is considered an integral part of a complete examination. Limited examinations for reoccurring indications may be performed as noted. The reflux portion of the exam is performed with the patient in reverse Trendelenburg.  +---------+---------------+---------+-----------+----------+--------------+ RIGHT    CompressibilityPhasicitySpontaneityPropertiesThrombus Aging +---------+---------------+---------+-----------+----------+--------------+ CFV      Full           Yes      Yes                                 +---------+---------------+---------+-----------+----------+--------------+ SFJ      Full                                                        +---------+---------------+---------+-----------+----------+--------------+ FV Prox  Full                                                        +---------+---------------+---------+-----------+----------+--------------+ FV Mid   Full           Yes      Yes                                 +---------+---------------+---------+-----------+----------+--------------+ FV DistalFull                                                        +---------+---------------+---------+-----------+----------+--------------+ PFV      Full                                                        +---------+---------------+---------+-----------+----------+--------------+ POP      Full           Yes      Yes                                 +---------+---------------+---------+-----------+----------+--------------+ PTV      Full                                                         +---------+---------------+---------+-----------+----------+--------------+ PERO     Full                                                        +---------+---------------+---------+-----------+----------+--------------+   +---------+---------------+---------+-----------+----------+--------------+  LEFT     CompressibilityPhasicitySpontaneityPropertiesThrombus Aging +---------+---------------+---------+-----------+----------+--------------+ CFV      Full           Yes      Yes                                 +---------+---------------+---------+-----------+----------+--------------+ SFJ      Full                                                        +---------+---------------+---------+-----------+----------+--------------+ FV Prox  Full                                                        +---------+---------------+---------+-----------+----------+--------------+ FV Mid   Full           Yes      Yes                                 +---------+---------------+---------+-----------+----------+--------------+ FV DistalFull                                                        +---------+---------------+---------+-----------+----------+--------------+ PFV      Full                                                        +---------+---------------+---------+-----------+----------+--------------+ POP      Full           Yes      Yes                                 +---------+---------------+---------+-----------+----------+--------------+ PTV      Full                                                        +---------+---------------+---------+-----------+----------+--------------+ PERO     Full                                                        +---------+---------------+---------+-----------+----------+--------------+     Summary: RIGHT: - There is no evidence of deep vein thrombosis in the  lower extremity.  - No cystic structure found in the popliteal fossa.  LEFT: - There is no evidence of deep vein thrombosis in the lower extremity.  - No cystic structure found in the popliteal  fossa.  *See table(s) above for measurements and observations. Electronically signed by Irvin Mantel on 01/22/2024 at 1:40:28 PM.    Final     CODE STATUS:  Code Status History     Date Active Date Inactive Code Status Order ID Comments User Context   07/31/2017 1836 08/05/2017 1524 Full Code [id number]  Isadora Mar, MD Inpatient   04/03/2016 1258 04/03/2016 2124 Full Code [id number]  Lei Pump, MD Inpatient   03/17/2016 1726 03/18/2016 1457 Full Code [id number]  Nohemi Batters, NP ED   03/06/2016 1903 03/08/2016 1529 Full Code [id number]  Nohemi Batters, NP ED       No orders of the defined types were placed in this encounter.    Future Appointments  Date Time Provider Department Center  01/29/2024 11:15 AM [facility] INFUSION [facility] None  02/03/2024  1:45 PM Deneise Finlay, MD [facility] None  02/19/2024 10:30 AM [facility] LAB [facility] None  02/19/2024 11:00 AM Ayari Liwanag, MD [facility] None  02/19/2024 11:45 AM [facility] INFUSION [facility] None  03/11/2024  9:30 AM [facility] LAB [facility] None  03/11/2024 10:00 AM Aamna Mallozzi, MD [facility] None  03/11/2024 10:30 AM [facility] INFUSION [facility] None  10/01/2024  1:40 PM [facility] ANNUAL WELLNESS VISIT 1 [facility] PEC      This document was completed utilizing speech recognition software. Grammatical errors, random word insertions, pronoun errors, and incomplete sentences are an occasional consequence of this system due to software limitations, ambient noise, and hardware issues. Any formal questions or concerns about the content, text or information contained within the body of this dictation should be directly addressed to the provider for clarification.

## 2024-01-29 NOTE — Telephone Encounter (Signed)
 Pt never returned our call but did see oncology today who prescribed meds for cellulitis and eval legs, see their office notes from today  FYI to PCP

## 2024-01-29 NOTE — Assessment & Plan Note (Signed)
 Presents with multiple diffuse skin lesions over a three-month period, following a previous excision and skin graft for squamous cell carcinoma that extended to the bone.    Lesions are widespread, itchy, and confirmed as squamous cell carcinoma through biopsy on 10/06/2023, indicating diffuse spread. The goal is disease control and prevention of further spread.   Cemiplimab , an immunotherapy, is recommended, with a better tolerance profile than chemotherapy. Treatment is administered IV every three weeks indefinitely, with monitoring for immune-mediated side effects, particularly thyroid  function.   On 12/12/2023, baseline whole-body PET scan was obtained.  It showed few scattered lower extremity dermal hypermetabolic lesions.  No abnormal uptake in the lymph nodes or other areas of metastatic soft tissue.  Discussed PET scan results with the patient and her caregiver today.  She was started on systemic immunotherapy with cemiplimab  from 12/18/2023.  Tolerating treatments well.  Labs today reveal no dose-limiting toxicities.  Will proceed with cemiplimab  dose today and continue this every 3 weeks indefinitely.  No contraindications for acitretin with current immunotherapy. Lipid and liver function tests required before initiating acitretin. Acitretin recommended by [facility] specialists and compatible with ongoing treatment.  RTC in 3 weeks for labs, follow-up and continuation of cemiplimab .

## 2024-01-29 NOTE — Progress Notes (Signed)
 Patient seen by Dr. Archie Patten Pasam today  Vitals are within treatment parameters:Yes   Labs are within treatment parameters: Yes   Treatment plan has been signed: Yes   Per physician team, Patient is ready for treatment and there are NO modifications to the treatment plan.

## 2024-01-30 ENCOUNTER — Encounter: Payer: Self-pay | Admitting: Oncology

## 2024-01-30 DIAGNOSIS — R21 Rash and other nonspecific skin eruption: Secondary | ICD-10-CM | POA: Insufficient documentation

## 2024-01-30 LAB — T4: T4, Total: 10.2 ug/dL (ref 4.5–12.0)

## 2024-01-30 NOTE — Assessment & Plan Note (Addendum)
 Concern for cellulitis due to warmth and discomfort in the legs. Differential includes reaction to acitretin .   Recently ultrasound of the lower extremities was negative for DVT.  Ciprofloxacin was chosen despite previous adverse reactions, as these were not true allergies. - Prescribe ciprofloxacin 250 mg twice daily to address potential cellulitis. - Monitor for improvement in symptoms by the next appointment with Dr.Paci - Monitor for any adverse reactions, particularly sleep disturbances or muscle spasms.

## 2024-01-30 NOTE — Assessment & Plan Note (Signed)
 Erythematous rash potentially related to acitretin , which can cause skin exfoliation, dry skin, and rash in 10-25% of patients. Differential includes cellulitis due to warmth and discomfort in the legs, causing significant discomfort and concern for underlying cellulitis. - Prescribe ciprofloxacin 250 mg twice daily, considering allergies and adverse reactions to other antibiotics. - Instruct to monitor for diarrhea, especially more than four loose stools a day, and to stop the antibiotic and notify if this occurs. - Advise to consume probiotics to prevent antibiotic-associated side effects. - Continue immunotherapy as planned.

## 2024-02-03 ENCOUNTER — Ambulatory Visit: Admitting: Dermatology

## 2024-02-03 ENCOUNTER — Encounter: Payer: Self-pay | Admitting: Dermatology

## 2024-02-03 VITALS — BP 104/68 | HR 83

## 2024-02-03 DIAGNOSIS — R600 Localized edema: Secondary | ICD-10-CM | POA: Diagnosis not present

## 2024-02-03 DIAGNOSIS — C4492 Squamous cell carcinoma of skin, unspecified: Secondary | ICD-10-CM

## 2024-02-03 DIAGNOSIS — T451X5A Adverse effect of antineoplastic and immunosuppressive drugs, initial encounter: Secondary | ICD-10-CM | POA: Diagnosis not present

## 2024-02-03 DIAGNOSIS — C44622 Squamous cell carcinoma of skin of right upper limb, including shoulder: Secondary | ICD-10-CM | POA: Diagnosis not present

## 2024-02-03 DIAGNOSIS — L299 Pruritus, unspecified: Secondary | ICD-10-CM | POA: Diagnosis not present

## 2024-02-03 DIAGNOSIS — Z79899 Other long term (current) drug therapy: Secondary | ICD-10-CM | POA: Diagnosis not present

## 2024-02-03 DIAGNOSIS — C44629 Squamous cell carcinoma of skin of left upper limb, including shoulder: Secondary | ICD-10-CM

## 2024-02-03 DIAGNOSIS — R21 Rash and other nonspecific skin eruption: Secondary | ICD-10-CM | POA: Diagnosis not present

## 2024-02-03 MED ORDER — ACITRETIN 10 MG PO CAPS: 10.0000 mg | ORAL_CAPSULE | Freq: Every day | ORAL | 1 refills | Status: DC

## 2024-02-03 MED ORDER — CLOBETASOL PROPIONATE 0.05 % EX OINT: 1.0000 | TOPICAL_OINTMENT | Freq: Two times a day (BID) | CUTANEOUS | 5 refills | Status: DC

## 2024-02-03 MED ORDER — FLUOROURACIL CHEMO INJECTION 500 MG/10ML FOR DERMATOLOGY USE: 50.0000 mg | Freq: Once | INTRAVENOUS | Status: DC

## 2024-02-03 NOTE — Patient Instructions (Signed)

## 2024-02-03 NOTE — Progress Notes (Addendum)
 Follow-Up Visit   Subjective  Linda Mcgrath is a 89 y.o. female who presents for the following: here to follow up for recurrent SCC with diffuse spread. She is s/p 3 rounds of cemiplimab  infusions, with her most recent one being.   The patient presents for follow-up of recurrent, widespread cutaneous SCC involving both upper and lower extremities. Recently initiated on acitretin  10 mg daily in addition to ongoing cemiplimab  immunotherapy. Reports increased redness, crusting, and swelling in the legs, with reduced number of hyperkeratotic nodules but continued intense erythema and inflammation, particularly around residual plaques. Pruritus remains bothersome.   The following portions of the chart were reviewed this encounter and updated as appropriate: medications, allergies, medical history  Review of Systems:  No other skin or systemic complaints except as noted in HPI or Assessment and Plan.  Objective  Well appearing patient in no apparent distress; mood and affect are within normal limits.  A full examination was performed including scalp, head, eyes, ears, nose, lips, neck, chest, axillae, abdomen, back, buttocks, bilateral upper extremities, bilateral lower extremities, hands, feet, fingers, toes, fingernails, and toenails. All findings within normal limits unless otherwise noted below.   Marked erythema and diffuse crusting of BLE; edema present bilaterally without fluctuance or warmth; reduced hyperkeratotic nodules overall; persistent plaques with significant surrounding erythema on BUE and BLE.  No signs of systemic infection or overt cellulitis.  Relevant exam findings are noted in the Assessment and Plan.  Left Forearm - Anterior Hyperkeratotic papule Right Forearm - Anterior Hyperkeratotic papule  Assessment & Plan   Recurrent, widespread cutaneous SCC: Clinical improvement noted with cemiplimab  and acitretin ; fewer nodules but continued inflammatory  response. Given patient's extensive disease and current treatment regimen, high complexity medical decision-making is warranted. - Continue cemiplimab  infusion as scheduled. - Continue acitretin  10 mg daily. - Consider intralesional 5-FU (IL5FU) at next visit to target two persistent hyperkeratotic nodules on bilateral forearms. -Labs to be repeated at next infusion for monitoring.  0.4 mL of IL 5FU injected total today   Procedure Note Intralesional Injection  Location: Left and Right forearm  Informed Consent: Discussed risks (infection, pain, bleeding, bruising, thinning of the skin, loss of skin pigment, lack of resolution, and recurrence of lesion) and benefits of the procedure, as well as the alternatives. Informed consent was obtained. Preparation: The area was prepared a standard fashion.  Procedure Details: An intralesional injection was performed with 5-fluorouracil . 0.4 cc in total were injected. NDC #: [id number] Exp: 03/2025  Total number of injections: 2  Plan: The patient was instructed on post-op care. Recommend OTC analgesia as needed for pain.  Severe inflammatory response secondary to immunotherapy + retinoid: - Discussed expected robust immune reaction to treatment; this appears to be a therapeutic rather than infectious process. - No clinical signs of cellulitis; bilateral presentation makes infection unlikely.  Bilateral lower extremity edema, non-infectious: - Prior DVT workup negative. - Swelling may be related to underlying cardiac or vascular issues. - Referral to cardiology placed for evaluation of BLE edema; will include echocardiogram to rule out heart failure.  Cutaneous inflammation and pruritus: - Escalate topical steroids to clobetasol  ointment BID to affected extremities. - Will monitor closely for steroid response and tolerance.  Time Spent: Total of 60 minutes was spent on the date of the encounter in evaluation and management, including reviewing  labs and history, performing a detailed skin exam, coordinating care with cardiology, and providing extensive counseling regarding ongoing immunotherapy, medication side effects, and expected inflammatory  response.  This documentation supports CPT [id number] based on:  High complexity of MDM: Multiple chronic conditions with exacerbation, medication management with immunotherapy and systemic retinoid, and consideration of intralesional chemotherapy.  Moderate to high risk: Use of systemic immunotherapy, acitretin , potential need for further procedures (IL5FU), and significant inflammation requiring workup for edema.  40 minutes total time on the date of service.   Return in about 6 weeks (around 03/16/2024).  I, Haig Levan, Surg Tech III, am acting as scribe for Deneise Finlay, MD.   Documentation: I have reviewed the above documentation for accuracy and completeness, and I agree with the above.  Deneise Finlay, MD

## 2024-02-05 ENCOUNTER — Other Ambulatory Visit: Payer: Self-pay | Admitting: Oncology

## 2024-02-05 DIAGNOSIS — L03119 Cellulitis of unspecified part of limb: Secondary | ICD-10-CM

## 2024-02-19 ENCOUNTER — Inpatient Hospital Stay: Admitting: Oncology

## 2024-02-19 ENCOUNTER — Inpatient Hospital Stay

## 2024-02-19 DIAGNOSIS — C4492 Squamous cell carcinoma of skin, unspecified: Secondary | ICD-10-CM

## 2024-02-19 DIAGNOSIS — C44629 Squamous cell carcinoma of skin of left upper limb, including shoulder: Secondary | ICD-10-CM | POA: Diagnosis not present

## 2024-02-19 DIAGNOSIS — L299 Pruritus, unspecified: Secondary | ICD-10-CM | POA: Diagnosis not present

## 2024-02-19 DIAGNOSIS — Z79620 Long term (current) use of immunosuppressive biologic: Secondary | ICD-10-CM | POA: Diagnosis not present

## 2024-02-19 DIAGNOSIS — R21 Rash and other nonspecific skin eruption: Secondary | ICD-10-CM

## 2024-02-19 DIAGNOSIS — Z5112 Encounter for antineoplastic immunotherapy: Secondary | ICD-10-CM | POA: Diagnosis not present

## 2024-02-19 LAB — CBC WITH DIFFERENTIAL (CANCER CENTER ONLY)
Abs Immature Granulocytes: 0.05 10*3/uL (ref 0.00–0.07)
Basophils Absolute: 0.1 10*3/uL (ref 0.0–0.1)
Basophils Relative: 1 %
Eosinophils Absolute: 0.1 10*3/uL (ref 0.0–0.5)
Eosinophils Relative: 1 %
HCT: 40 % (ref 36.0–46.0)
Hemoglobin: 13.3 g/dL (ref 12.0–15.0)
Immature Granulocytes: 1 %
Lymphocytes Relative: 22 %
Lymphs Abs: 1.9 10*3/uL (ref 0.7–4.0)
MCH: 30.2 pg (ref 26.0–34.0)
MCHC: 33.3 g/dL (ref 30.0–36.0)
MCV: 90.9 fL (ref 80.0–100.0)
Monocytes Absolute: 1.2 10*3/uL — ABNORMAL HIGH (ref 0.1–1.0)
Monocytes Relative: 14 %
Neutro Abs: 5.3 10*3/uL (ref 1.7–7.7)
Neutrophils Relative %: 61 %
Platelet Count: 502 10*3/uL — ABNORMAL HIGH (ref 150–400)
RBC: 4.4 MIL/uL (ref 3.87–5.11)
RDW: 12.1 % (ref 11.5–15.5)
WBC Count: 8.6 10*3/uL (ref 4.0–10.5)
nRBC: 0 % (ref 0.0–0.2)

## 2024-02-19 LAB — CMP (CANCER CENTER ONLY)
ALT: 107 U/L — ABNORMAL HIGH (ref 0–44)
AST: 152 U/L — ABNORMAL HIGH (ref 15–41)
Albumin: 3.3 g/dL — ABNORMAL LOW (ref 3.5–5.0)
Alkaline Phosphatase: 63 U/L (ref 38–126)
Anion gap: 14 (ref 5–15)
BUN: 31 mg/dL — ABNORMAL HIGH (ref 8–23)
CO2: 22 mmol/L (ref 22–32)
Calcium: 9 mg/dL (ref 8.9–10.3)
Chloride: 106 mmol/L (ref 98–111)
Creatinine: 1.54 mg/dL — ABNORMAL HIGH (ref 0.44–1.00)
GFR, Estimated: 32 mL/min — ABNORMAL LOW (ref >60)
Glucose, Bld: 121 mg/dL — ABNORMAL HIGH (ref 70–99)
Potassium: 4.7 mmol/L (ref 3.5–5.1)
Sodium: 142 mmol/L (ref 135–145)
Total Bilirubin: 1 mg/dL (ref 0.0–1.2)
Total Protein: 6.8 g/dL (ref 6.5–8.1)

## 2024-02-19 NOTE — Progress Notes (Deleted)
 [facility]  ONCOLOGY CLINIC PROGRESS NOTE   Patient Care Team: [name], Laine LABOR, MD as PCP - General Alline Lenis, MD (Inactive) as Consulting Physician (Urology) Jorizzo, Fairy CROME, MD as Referring Physician (Dermatology) Leslee Reusing, MD as Consulting Physician (Ophthalmology) Corey Rufus HERO, MD (Dermatology)  PATIENT NAME: Linda Mcgrath   MR#: [id number] DOB: January 27, 1933  Date of visit: 02/19/2024   ASSESSMENT & PLAN:   Linda Mcgrath is a 89 y.o.  pleasant lady with a past medical history of dyslipidemia, hypothyroidism, colon polyps, was referred to our clinic in March 2025 for recurrent squamous of carcinoma of the skin.  She was started on cemiplimab  from 12/18/2023.  Recurrent squamous cell carcinoma of skin Presents with multiple diffuse skin lesions over a three-month period, following a previous excision and skin graft for squamous cell carcinoma that extended to the bone.    Lesions are widespread, itchy, and confirmed as squamous cell carcinoma through biopsy on 10/06/2023, indicating diffuse spread. The goal is disease control and prevention of further spread.   Cemiplimab , an immunotherapy, is recommended, with a better tolerance profile than chemotherapy. Treatment is administered IV every three weeks indefinitely, with monitoring for immune-mediated side effects, particularly thyroid  function.   On 12/12/2023, baseline whole-body PET scan was obtained.  It showed few scattered lower extremity dermal hypermetabolic lesions.  No abnormal uptake in the lymph nodes or other areas of metastatic soft tissue.  Discussed PET scan results with the patient and her caregiver previously.   She was started on systemic immunotherapy with cemiplimab  from 12/18/2023.  Tolerating treatments well.  Labs today reveal no dose-limiting toxicities.  Will proceed with cemiplimab  dose today and continue this every 3 weeks indefinitely.  No  contraindications for acitretin  with current immunotherapy. Lipid and liver function tests required before initiating acitretin . Acitretin  recommended by [facility] specialists and compatible with ongoing treatment.  RTC in 3 weeks for labs, follow-up and continuation of cemiplimab .  Erythematous rash Erythematous rash potentially related to acitretin , which can cause skin exfoliation, dry skin, and rash in 10-25% of patients. Differential includes cellulitis due to warmth and discomfort in the legs, causing significant discomfort and concern for underlying cellulitis. - Prescribe ciprofloxacin  250 mg twice daily, considering allergies and adverse reactions to other antibiotics. - Instruct to monitor for diarrhea, especially more than four loose stools a day, and to stop the antibiotic and notify if this occurs. - Advise to consume probiotics to prevent antibiotic-associated side effects. - Continue immunotherapy as planned.    I reviewed lab results and outside records for this visit and discussed relevant results with the patient. Diagnosis, plan of care and treatment options were also discussed in detail with the patient. Opportunity provided to ask questions and answers provided to her apparent satisfaction. Provided instructions to call our clinic with any problems, questions or concerns prior to return visit. I recommended to continue follow-up with PCP and sub-specialists. She verbalized understanding and agreed with the plan.   NCCN guidelines have been consulted in the planning of this patient's care.  I spent a total of 30 minutes during this encounter with the patient including review of chart and various tests results, discussions about plan of care and coordination of care plan.   Chinita Patten, MD  02/19/2024 11:24 AM  [facility] - A DEPT OF [facility] [address] [place] [address] Dept:  [phone] Dept Fax:  [phone]    CHIEF COMPLAINT/ REASON FOR VISIT:   Recurrent squamous cell carcinoma of the skin.  Current Treatment: Cemiplimab  started from 12/18/2023.  INTERVAL HISTORY:    Discussed the use of AI scribe software for clinical note transcription with the patient, who gave verbal consent to proceed.  History of Present Illness  Linda Mcgrath is an 89 year old female who presents with worsening leg lesions.  She is experiencing worsening leg lesions, described as bumps on her legs that have become particularly miserable. These lesions have developed since her last visit and were not present previously. She has been elevating her legs at night and several times during the day to alleviate symptoms.  She has been on immunotherapy and recently started acitretin , a medication prescribed following lab results. Her caregiver suspects that the new medication might be contributing to the skin issues.  She has a history of multiple allergies to antibiotics, including penicillin, Bactrim, tetracycline, and ciprofloxacin . Some of these reactions are described as adverse reactions rather than true allergies, such as sleep disturbances and muscle spasms.  No fever. She reports eating well and has no current side effects from the immunotherapy.   I have reviewed the past medical history, past surgical history, social history and family history with the patient and they are unchanged from previous note.  HISTORY OF PRESENT ILLNESS:   ONCOLOGY HISTORY:   She was first diagnosed with squamous cell carcinoma of the skin on dorsal aspect of left hand in September 2024 and underwent Mohs surgery followed by full-thickness skin graft repair in October 2024.  This healed well.   Later on she was found to have multiple skin lesions scattered all across her extremities, torso, which were felt to be squamous cell carcinomas.  She did have biopsies on 10/06/2023 from left  lower leg, left forearm and right forearm all of which showed well-differentiated squamous cell carcinomas.   At this point, her dermatologist Dr. Corey sent a referral to us  for consideration of systemic treatment with cemiplimab  given recurrent squamous cell carcinoma of the skin.    Multiple diffuse skin lesions over a three-month period, following a previous excision and skin graft for squamous cell carcinoma that extended to the bone.    Lesions are widespread, itchy, and confirmed as squamous cell carcinoma through biopsy on 10/06/2023, indicating diffuse spread. The goal is disease control and prevention of further spread.   Cemiplimab , an immunotherapy, is recommended, with a better tolerance profile than chemotherapy. Treatment is administered IV every three weeks indefinitely, with monitoring for immune-mediated side effects, particularly thyroid  function.   On 12/12/2023, baseline whole-body PET scan was obtained.  It showed few scattered lower extremity dermal hypermetabolic lesions.  No abnormal uptake in the lymph nodes or other areas of metastatic soft tissue.  She was started on cemiplimab  from 12/18/2023.   Experiences severe pruritus associated with skin lesions. Current topical treatments, including triamcinolone  cream, provide limited relief. Oral antihistamines like Benadryl are not recommended due to drowsiness and fall risk. Zyrtec is recommended for its lower drowsiness risk.     Oncology History  Recurrent squamous cell carcinoma of skin  10/22/2023 Initial Diagnosis   Recurrent squamous cell carcinoma of skin   12/18/2023 -  Chemotherapy   Patient is on Treatment Plan : ADVANCED CUTANEOUS SQUAMOUS CELL CARCINOMA Cemiplimab  q21d         REVIEW OF SYSTEMS:   Review of Systems - Oncology  All other pertinent systems were reviewed with the  patient and are negative.  ALLERGIES: She is allergic to shellfish allergy, tetracycline, amlodipine besylate, codeine,  propoxyphene hcl, sulfamethoxazole-trimethoprim, tape, xarelto  [rivaroxaban ], amoxicillin, other, and penicillins.  MEDICATIONS:  Current Outpatient Medications  Medication Sig Dispense Refill   acitretin  (SORIATANE ) 10 MG capsule Take 1 capsule (10 mg total) by mouth daily before breakfast. 30 capsule 1   acitretin  (SORIATANE ) 10 MG capsule Take 1 capsule (10 mg total) by mouth daily before breakfast. 60 capsule 1   alendronate  (FOSAMAX ) 70 MG tablet Take 70 mg by mouth once a week.     cholestyramine  (QUESTRAN ) 4 g packet MIX AND DRINK 1 PACKET DAILY 90 each 1   ciprofloxacin  (CIPRO ) 250 MG tablet Take 1 tablet (250 mg total) by mouth 2 (two) times daily. 20 tablet 0   clobetasol  ointment (TEMOVATE ) 0.05 % Apply 1 Application topically 2 (two) times daily. 60 g 5   diphenhydrAMINE (BENADRYL) 25 MG tablet Take 25 mg by mouth every 8 (eight) hours as needed for itching (Patient report she takes for itching).     fluticasone  (FLONASE ) 50 MCG/ACT nasal spray Place 1 spray into both nostrils daily for 3 days. (Patient not taking: Reported on 01/29/2024) 16 g 0   GEMTESA 75 MG TABS Take 1 tablet by mouth daily.     levothyroxine  (SYNTHROID ) 100 MCG tablet Take 1 tablet (100 mcg total) by mouth daily before breakfast. 90 tablet 2   ondansetron  (ZOFRAN ) 8 MG tablet Take 1 tablet (8 mg total) by mouth every 8 (eight) hours as needed for nausea or vomiting. (Patient not taking: Reported on 01/29/2024) 30 tablet 1   prochlorperazine  (COMPAZINE ) 10 MG tablet Take 1 tablet (10 mg total) by mouth every 6 (six) hours as needed for nausea or vomiting. (Patient not taking: Reported on 01/29/2024) 30 tablet 1   solifenacin  (VESICARE ) 10 MG tablet Take 1 tablet (10 mg total) by mouth daily. 90 tablet 1   triamcinolone  ointment (KENALOG ) 0.1 % Apply 1 Application topically daily as needed. 453 g 2   Current Facility-Administered Medications  Medication Dose Route Frequency Provider Last Rate Last Admin   fluorouracil   (ADRUCIL ) chemo injection 50 mg  50 mg Intralesional Once          VITALS:   Last menstrual period 08/26/1969.  Wt Readings from Last 3 Encounters:  01/29/24 143 lb 1 oz (64.9 kg)  01/08/24 144 lb 5 oz (65.5 kg)  12/18/23 147 lb 9.6 oz (67 kg)    There is no height or weight on file to calculate BMI.        PHYSICAL EXAM:   Physical Exam Constitutional:      General: She is not in acute distress.    Appearance: Normal appearance.  HENT:     Head: Normocephalic and atraumatic.   Cardiovascular:     Rate and Rhythm: Normal rate and regular rhythm.     Heart sounds: Normal heart sounds.  Pulmonary:     Effort: Pulmonary effort is normal.     Breath sounds: Normal breath sounds.  Abdominal:     General: There is no distension.   Skin:    Comments:  Multiple skin lesions throughout extremities, back and torso, consistent with squamous cell skin cancers.  Some with excoriations. ?  Cellulitis component   Neurological:     General: No focal deficit present.     Mental Status: She is alert and oriented to person, place, and time.   Psychiatric:  Mood and Affect: Mood normal.        Behavior: Behavior normal.      LABORATORY DATA:   I have reviewed the data as listed.  Results for orders placed or performed in visit on 02/19/24  CMP (Cancer Center only)  Result Value Ref Range   Sodium 142 135 - 145 mmol/L   Potassium 4.7 3.5 - 5.1 mmol/L   Chloride 106 98 - 111 mmol/L   CO2 22 22 - 32 mmol/L   Glucose, Bld 121 (H) 70 - 99 mg/dL   BUN 31 (H) 8 - 23 mg/dL   Creatinine 8.45 (H) 9.55 - 1.00 mg/dL   Calcium  9.0 8.9 - 10.3 mg/dL   Total Protein 6.8 6.5 - 8.1 g/dL   Albumin 3.3 (L) 3.5 - 5.0 g/dL   AST 847 (H) 15 - 41 U/L   ALT 107 (H) 0 - 44 U/L   Alkaline Phosphatase 63 38 - 126 U/L   Total Bilirubin 1.0 0.0 - 1.2 mg/dL   GFR, Estimated 32 (L) >60 mL/min   Anion gap 14 5 - 15  CBC with Differential (Cancer Center Only)  Result Value Ref Range   WBC  Count 8.6 4.0 - 10.5 K/uL   RBC 4.40 3.87 - 5.11 MIL/uL   Hemoglobin 13.3 12.0 - 15.0 g/dL   HCT 59.9 63.9 - 53.9 %   MCV 90.9 80.0 - 100.0 fL   MCH 30.2 26.0 - 34.0 pg   MCHC 33.3 30.0 - 36.0 g/dL   RDW 87.8 88.4 - 84.4 %   Platelet Count 502 (H) 150 - 400 K/uL   nRBC 0.0 0.0 - 0.2 %   Neutrophils Relative % 61 %   Neutro Abs 5.3 1.7 - 7.7 K/uL   Lymphocytes Relative 22 %   Lymphs Abs 1.9 0.7 - 4.0 K/uL   Monocytes Relative 14 %   Monocytes Absolute 1.2 (H) 0.1 - 1.0 K/uL   Eosinophils Relative 1 %   Eosinophils Absolute 0.1 0.0 - 0.5 K/uL   Basophils Relative 1 %   Basophils Absolute 0.1 0.0 - 0.1 K/uL   Immature Granulocytes 1 %   Abs Immature Granulocytes 0.05 0.00 - 0.07 K/uL      RADIOGRAPHIC STUDIES:  I have personally reviewed the radiological images as listed and agree with the findings in the report.  VAS US  LOWER EXTREMITY VENOUS (DVT) Result Date: 01/22/2024  Lower Venous DVT Study Patient Name:  Linda Mcgrath  Date of Exam:   01/22/2024 Medical Rec #: [id number]                 Accession #:    [id number] Date of Birth: 1933-08-23                  Patient Gender: F Patient Age:   85 years Exam Location:  [address] Procedure:      VAS US  LOWER EXTREMITY VENOUS (DVT) Referring Phys: CHINITA Jhace Fennell --------------------------------------------------------------------------------  Indications: Edema, and Erythema.  Comparison Study: No prior study Performing Technologist: Rosaline Fujisawa MHA, RDMS, RVT, RDCS  Examination Guidelines: A complete evaluation includes B-mode imaging, spectral Doppler, color Doppler, and power Doppler as needed of all accessible portions of each vessel. Bilateral testing is considered an integral part of a complete examination. Limited examinations for reoccurring indications may be performed as noted. The reflux portion of the exam is performed with the patient in reverse Trendelenburg.   +---------+---------------+---------+-----------+----------+--------------+ RIGHT    CompressibilityPhasicitySpontaneityPropertiesThrombus Aging +---------+---------------+---------+-----------+----------+--------------+ CFV  Full           Yes      Yes                                 +---------+---------------+---------+-----------+----------+--------------+ SFJ      Full                                                        +---------+---------------+---------+-----------+----------+--------------+ FV Prox  Full                                                        +---------+---------------+---------+-----------+----------+--------------+ FV Mid   Full           Yes      Yes                                 +---------+---------------+---------+-----------+----------+--------------+ FV DistalFull                                                        +---------+---------------+---------+-----------+----------+--------------+ PFV      Full                                                        +---------+---------------+---------+-----------+----------+--------------+ POP      Full           Yes      Yes                                 +---------+---------------+---------+-----------+----------+--------------+ PTV      Full                                                        +---------+---------------+---------+-----------+----------+--------------+ PERO     Full                                                        +---------+---------------+---------+-----------+----------+--------------+   +---------+---------------+---------+-----------+----------+--------------+ LEFT     CompressibilityPhasicitySpontaneityPropertiesThrombus Aging +---------+---------------+---------+-----------+----------+--------------+ CFV      Full           Yes      Yes                                  +---------+---------------+---------+-----------+----------+--------------+  SFJ      Full                                                        +---------+---------------+---------+-----------+----------+--------------+ FV Prox  Full                                                        +---------+---------------+---------+-----------+----------+--------------+ FV Mid   Full           Yes      Yes                                 +---------+---------------+---------+-----------+----------+--------------+ FV DistalFull                                                        +---------+---------------+---------+-----------+----------+--------------+ PFV      Full                                                        +---------+---------------+---------+-----------+----------+--------------+ POP      Full           Yes      Yes                                 +---------+---------------+---------+-----------+----------+--------------+ PTV      Full                                                        +---------+---------------+---------+-----------+----------+--------------+ PERO     Full                                                        +---------+---------------+---------+-----------+----------+--------------+     Summary: RIGHT: - There is no evidence of deep vein thrombosis in the lower extremity.  - No cystic structure found in the popliteal fossa.  LEFT: - There is no evidence of deep vein thrombosis in the lower extremity.  - No cystic structure found in the popliteal fossa.  *See table(s) above for measurements and observations. Electronically signed by Fonda Rim on 01/22/2024 at 1:40:28 PM.    Final     CODE STATUS:  Code Status History     Date Active Date Inactive Code Status Order ID Comments User Context   07/31/2017 1836 08/05/2017 1524 Full Code [id number]  Joshua Alm RAMAN, MD Inpatient   04/03/2016  1258 04/03/2016 2124 Full Code  [id number]  Inocencio Soyla Lunger, MD Inpatient   03/17/2016 1726 03/18/2016 1457 Full Code [id number]  Isaiah Slain, NP ED   03/06/2016 1903 03/08/2016 1529 Full Code [id number]  Isaiah Slain, NP ED       No orders of the defined types were placed in this encounter.    Future Appointments  Date Time Provider Department Center  02/19/2024 11:45 AM [facility] INFUSION [facility] None  03/11/2024  9:30 AM [facility] LAB [facility] None  03/11/2024 10:00 AM Orla Estrin, MD [facility] None  03/11/2024 10:30 AM [facility] INFUSION [facility] None  03/16/2024  1:45 PM Paci, Rufus HERO, MD [facility] None  10/01/2024  1:40 PM [facility] ANNUAL WELLNESS VISIT 1 [facility] PEC      This document was completed utilizing speech recognition software. Grammatical errors, random word insertions, pronoun errors, and incomplete sentences are an occasional consequence of this system due to software limitations, ambient noise, and hardware issues. Any formal questions or concerns about the content, text or information contained within the body of this dictation should be directly addressed to the provider for clarification.

## 2024-02-19 NOTE — Assessment & Plan Note (Signed)
 Erythematous rash potentially related to acitretin , which can cause skin exfoliation, dry skin, and rash in 10-25% of patients. Differential includes cellulitis due to warmth and discomfort in the legs, causing significant discomfort and concern for underlying cellulitis. - Prescribe ciprofloxacin 250 mg twice daily, considering allergies and adverse reactions to other antibiotics. - Instruct to monitor for diarrhea, especially more than four loose stools a day, and to stop the antibiotic and notify if this occurs. - Advise to consume probiotics to prevent antibiotic-associated side effects. - Continue immunotherapy as planned.

## 2024-02-19 NOTE — Assessment & Plan Note (Signed)
 Presents with multiple diffuse skin lesions over a three-month period, following a previous excision and skin graft for squamous cell carcinoma that extended to the bone.    Lesions are widespread, itchy, and confirmed as squamous cell carcinoma through biopsy on 10/06/2023, indicating diffuse spread. The goal is disease control and prevention of further spread.   Cemiplimab , an immunotherapy, is recommended, with a better tolerance profile than chemotherapy. Treatment is administered IV every three weeks indefinitely, with monitoring for immune-mediated side effects, particularly thyroid  function.   On 12/12/2023, baseline whole-body PET scan was obtained.  It showed few scattered lower extremity dermal hypermetabolic lesions.  No abnormal uptake in the lymph nodes or other areas of metastatic soft tissue.  Discussed PET scan results with the patient and her caregiver previously.   She was started on systemic immunotherapy with cemiplimab  from 12/18/2023.  Tolerating treatments well.  Labs today reveal no dose-limiting toxicities.  Will proceed with cemiplimab  dose today and continue this every 3 weeks indefinitely.  No contraindications for acitretin  with current immunotherapy. Lipid and liver function tests required before initiating acitretin . Acitretin  recommended by [facility] specialists and compatible with ongoing treatment.  RTC in 3 weeks for labs, follow-up and continuation of cemiplimab .

## 2024-02-20 ENCOUNTER — Telehealth: Payer: Self-pay | Admitting: Oncology

## 2024-02-20 ENCOUNTER — Telehealth: Payer: Self-pay

## 2024-02-20 ENCOUNTER — Other Ambulatory Visit: Payer: Self-pay

## 2024-02-20 NOTE — Progress Notes (Signed)
 The patient was not seen in clinic this day.

## 2024-02-20 NOTE — Telephone Encounter (Signed)
 Left a voicemail to call us  back to reschedule yesterdays missed appointments.

## 2024-02-20 NOTE — Telephone Encounter (Signed)
 Unable to reach Linda Mcgrath (DPR signed ) at any contact #;' left v/ms for [facility] to call [phone]. I was able to speak with Linda Mcgrath (DPR signed)  and she said she is not able to drive but Linda said that in the last couple of weeks pt condition has declined significantly. Pt is not able to walk.pt is not eating or drinking ;pt is very weak and pt is unable to do her own personal care herself.  I explained that pt would need and in person visit not a video visit to be seen and eval and to get referral to either home health or hospice. Advised if pt is not able to walk should call 911 and take pt to ED for evaluation and possible testing. Cancelling appt on 02/23/24 with Dr Randeen. Explained to Linda have tried multiple attempts to reach [place] and Linda said she would call Linda and if cannot get her she will leave message that pt needs to go to ED today and Linda feels sure Linda will call her. Linda said she thinks pt has given up. Appt cancelled. Linda said pt would go to [facility] ED when Linda gets the message. The son that has been helping with pt is presently in [facility] in [place]. Sending note to Dr Randeen and [employer].

## 2024-02-20 NOTE — Progress Notes (Signed)
 Yesterday, patient left after her lab work because she didn't feel well. No one told the collaborative side so there was much looking around and calling. It took about an hour to figure out what happened. Then, later, the patient's friend called and asked if we could see pt tomorrow instead. Dr. Clovis schedule is full today. She was supposed to have treatment yesterday as well. I was never able to speak directly with the patient. Mary, a caretaker, and son, mentioned hospice may be something the patient is considering but were going to talk with the patient. All oncology care is on hold for now, per family's request as pt needs to make some decisions.

## 2024-02-22 NOTE — Telephone Encounter (Signed)
 I do not see any notes from the ED so far, please check in if they have not gone yet

## 2024-02-23 ENCOUNTER — Ambulatory Visit (INDEPENDENT_AMBULATORY_CARE_PROVIDER_SITE_OTHER): Admitting: Family Medicine

## 2024-02-23 ENCOUNTER — Ambulatory Visit: Admitting: Family Medicine

## 2024-02-23 ENCOUNTER — Encounter: Payer: Self-pay | Admitting: Family Medicine

## 2024-02-23 VITALS — Temp 97.8°F

## 2024-02-23 DIAGNOSIS — R64 Cachexia: Secondary | ICD-10-CM | POA: Diagnosis not present

## 2024-02-23 DIAGNOSIS — C44629 Squamous cell carcinoma of skin of left upper limb, including shoulder: Secondary | ICD-10-CM

## 2024-02-23 DIAGNOSIS — C4492 Squamous cell carcinoma of skin, unspecified: Secondary | ICD-10-CM

## 2024-02-23 DIAGNOSIS — R531 Weakness: Secondary | ICD-10-CM | POA: Diagnosis not present

## 2024-02-23 NOTE — Assessment & Plan Note (Addendum)
 Pt has experienced severe inflammatory response to immunotx and retinoid and chemo  Condition deteriorating  Covered with painful sores/scabs , poor mobility and cachexic  Unable to care for herself/ not mobile  Not eating or drinking  Family trying to care for her at home  Wishes to change to comfort care only with DNR status   Will let oncology know  Urgent hospice referral done

## 2024-02-23 NOTE — Progress Notes (Signed)
 Subjective:    Patient ID: Linda Mcgrath, female    DOB: [birth year], 89 y.o.   MRN: [id number]  HPI  Wt Readings from Last 3 Encounters:  01/29/24 143 lb 1 oz (64.9 kg)  01/08/24 144 lb 5 oz (65.5 kg)  12/18/23 147 lb 9.6 oz (67 kg)      Vitals:   02/23/24 1116  Temp: 97.8 F (36.6 C)    Pt presents for c/o General declining health   Unable to walk on her own Not eating or drinking  Very weak and cannot do her personal care  Wants to discuss next steps/possibly hospice in setting of recurrent squamous cell ca of skin , under oncology care  Had severe inflammatory response 2ndary to immunotherapy and retinoid  Developed LE edema with neg DVT work up    Treatment for her skin cancer has been very hard Rash and infections  Systemic immunotx with cemiplimab  from April  Also acitretin   Cipro  at last visit for infection /cellulitis    Cannot get blood pressure or weight today due to immobility and sores on skin   Covered with sores all over  Legs are improved (less red) but scabs everywhere Is exhausted  Wants to stop all treatment   Wants to be comfort care   Family is trying to push fluids  Not eating  Done with cipro   No nausea or vomiting  No fever No cough   Lives at home  Family is minutes away-someone is there all day   Has cane   Needs mobility support     Lab Results  Component Value Date   NA 142 02/19/2024   K 4.7 02/19/2024   CO2 22 02/19/2024   GLUCOSE 121 (H) 02/19/2024   BUN 31 (H) 02/19/2024   CREATININE 1.54 (H) 02/19/2024   CALCIUM  9.0 02/19/2024   GFR 60.47 04/23/2023   GFRNONAA 32 (L) 02/19/2024   Lab Results  Component Value Date   ALT 107 (H) 02/19/2024   AST 152 (H) 02/19/2024   ALKPHOS 63 02/19/2024   BILITOT 1.0 02/19/2024   Lab Results  Component Value Date   TSH 0.797 01/29/2024   Lab Results  Component Value Date   WBC 8.6 02/19/2024   HGB 13.3 02/19/2024   HCT 40.0 02/19/2024   MCV 90.9  02/19/2024   PLT 502 (H) 02/19/2024       Patient Active Problem List   Diagnosis Date Noted   General weakness 02/23/2024   Cachexia (HCC) 02/23/2024   Erythematous rash 01/30/2024   Cellulitis of lower extremity 01/29/2024   Pruritus, unspecified 11/20/2023   Recurrent squamous cell carcinoma of skin 10/22/2023   Dark stools 07/07/2023   Chronic diarrhea 04/23/2023   Squamous cell cancer of skin of left hand 04/23/2023   Abnormal urinalysis 03/10/2023   Actinic keratosis 03/10/2023   TMJ arthralgia 01/13/2023   Right shoulder pain 10/23/2022   Urinary frequency 03/26/2022   Chronic upper back pain 08/30/2020   Aortic atherosclerosis (HCC) 05/31/2020   Abnormal CT scan, small bowel 05/18/2020   Insomnia 12/15/2019   Chronic low back pain 10/18/2019   Osteoarthritis of right knee 08/02/2019   S/P lumbar spinal fusion 07/31/2017   H/O atrial flutter 03/06/2016   Obesity 11/06/2015   Urge incontinence 01/24/2015   Estrogen deficiency 10/25/2014   Lichen planus 12/20/2013   Prediabetes 06/03/2011   HYPERTENSION, BENIGN ESSENTIAL 10/23/2010   Osteoarthrosis, hand 01/24/2010   Hypothyroidism 08/24/2008   Vitamin D  deficiency  06/06/2008   Hyperlipidemia 06/06/2008   MENOPAUSAL SYNDROME 03/29/2008   Osteoporosis 03/29/2008   Allergic rhinitis 01/25/2008   IBS 01/25/2008   Osteoarthritis 01/25/2008   FIBROMYALGIA 01/25/2008   Past Medical History:  Diagnosis Date   Allergic rhinitis    Arthritis    Asthma    Cataract    Bil/lens implant   Colon polyp 1999   small polyp   Diverticulosis    Dysrhythmia    Fibromyalgia    Hyperlipidemia    Hypothyroidism    Internal hemorrhoids    Leg cramps    Lichen planus    Lung nodule    stable LUL 9 mm   Menopausal syndrome    Osteoarthritis    Squamous cell carcinoma of skin    UTI (urinary tract infection)    Past Surgical History:  Procedure Laterality Date   Abd U/S  11/1998   negative   Abd U/S  01/2001    gallbladder polyps   ABDOMINAL HYSTERECTOMY  1975   total-fibroids   APPENDECTOMY  1975   BREAST BIOPSY     benign   BREAST EXCISIONAL BIOPSY Left 1957   CARDIOVERSION N/A 03/07/2016   Procedure: CARDIOVERSION;  Surgeon: Gordy Bergamo, MD;  Location: [facility] ENDOSCOPY;  Service: Cardiovascular;  Laterality: N/A;   CATARACT EXTRACTION     bilateral   CHOLECYSTECTOMY     COLONOSCOPY  1998   Diverticulosis; polyp\   COLONOSCOPY  09/2002   Diverticulosis, hem   COLONOSCOPY  12/2007   diverticulosis, polyp   DEXA  10/2001   osteopenia   ELECTROPHYSIOLOGIC STUDY N/A 04/03/2016   Procedure: A-Flutter Ablation;  Surgeon: Will Gladis Norton, MD;  Location: [facility] INVASIVE CV LAB;  Service: Cardiovascular;  Laterality: N/A;   ESOPHAGOGASTRODUODENOSCOPY  2004   Hida scan  01/2001   Negative   KNEE SURGERY     right knee / 11/2004   LAMINECTOMY WITH POSTERIOR LATERAL ARTHRODESIS LEVEL 2 N/A 07/31/2017   Procedure: DECOMPRESSIVE LAMINECTOMY LUMABR FOUR-FIVE, LUMBAR FIVE-SACRAL ONE;  Surgeon: Joshua Alm RAMAN, MD;  Location: [facility] OR;  Service: Neurosurgery;  Laterality: N/A;   laser surgery for glaucoma Left 09/21/2014   MULTIPLE TOOTH EXTRACTIONS     rentinal tear     ROTATOR CUFF REPAIR     x 2 /right shoulder   SKIN CANCER EXCISION     pre-melanoma / on face   TEE WITHOUT CARDIOVERSION N/A 03/07/2016   Procedure: TRANSESOPHAGEAL ECHOCARDIOGRAM (TEE);  Surgeon: Gordy Bergamo, MD;  Location: [facility] ENDOSCOPY;  Service: Cardiovascular;  Laterality: N/A;   TOE SURGERY     rt foot    Social History   Tobacco Use   Smoking status: Never   Smokeless tobacco: Never  Vaping Use   Vaping status: Never Used  Substance Use Topics   Alcohol use: No    Alcohol/week: 0.0 standard drinks of alcohol    Comment: occasional-rare   Drug use: No   Family History  Problem Relation Age of Onset   Parkinsonism Father    Colon cancer Brother    Allergies Mother    Heart disease Mother    Kidney failure Son         congenital  (kidney transplant)    Heart failure Son        died suddenly of CHF    Allergies  Allergen Reactions   Shellfish Allergy Anaphylaxis and Nausea And Vomiting   Tetracycline Hives, Nausea And Vomiting, Rash and Other (See Comments)  SYSTEMIC REACTION: heart palpitations,muscle spasms, vomiting   Amlodipine Besylate Other (See Comments)    REACTION: muscle spasms, extremity swelling, low bp   Codeine Other (See Comments)    REACTION: hallucinations   Propoxyphene Hcl Other (See Comments)    Unknown   Sulfamethoxazole-Trimethoprim Nausea Only and Other (See Comments)    REACTION: dizzy, could not focus eyes and could not concentrate and nausea   Tape Other (See Comments)    BANDAIDS TAKE OFF HER SKIN; PLEASE USE COBAN OR PAPER    Xarelto  [Rivaroxaban ] Other (See Comments)    Aches and pains and nervousness   Amoxicillin Rash   Other Swelling, Rash and Other (See Comments)    Has autoimmune disorder and spices erode her salivary glands and affects ankles and mouth DIRECTLY (takes off 1st layer of skin and leaves her unable to walk)   Penicillins Swelling, Rash and Other (See Comments)    Has patient had a PCN reaction causing immediate rash, facial/tongue/throat swelling, SOB or lightheadedness with hypotension: Yes Has patient had a PCN reaction causing severe rash involving mucus membranes or skin necrosis: No Has patient had a PCN reaction that required hospitalization No Has patient had a PCN reaction occurring within the last 10 years: No If all of the above answers are NO, then may proceed with Cephalosporin use.    Current Outpatient Medications on File Prior to Visit  Medication Sig Dispense Refill   acitretin  (SORIATANE ) 10 MG capsule Take 1 capsule (10 mg total) by mouth daily before breakfast. 30 capsule 1   acitretin  (SORIATANE ) 10 MG capsule Take 1 capsule (10 mg total) by mouth daily before breakfast. 60 capsule 1   alendronate  (FOSAMAX ) 70 MG tablet Take  70 mg by mouth once a week.     cholestyramine  (QUESTRAN ) 4 g packet MIX AND DRINK 1 PACKET DAILY 90 each 1   clobetasol  ointment (TEMOVATE ) 0.05 % Apply 1 Application topically 2 (two) times daily. 60 g 5   diphenhydrAMINE (BENADRYL) 25 MG tablet Take 25 mg by mouth every 8 (eight) hours as needed for itching (Patient report she takes for itching).     fluticasone  (FLONASE ) 50 MCG/ACT nasal spray Place 1 spray into both nostrils daily for 3 days. 16 g 0   GEMTESA 75 MG TABS Take 1 tablet by mouth daily.     levothyroxine  (SYNTHROID ) 100 MCG tablet Take 1 tablet (100 mcg total) by mouth daily before breakfast. 90 tablet 2   ondansetron  (ZOFRAN ) 8 MG tablet Take 1 tablet (8 mg total) by mouth every 8 (eight) hours as needed for nausea or vomiting. 30 tablet 1   prochlorperazine  (COMPAZINE ) 10 MG tablet Take 1 tablet (10 mg total) by mouth every 6 (six) hours as needed for nausea or vomiting. 30 tablet 1   solifenacin  (VESICARE ) 10 MG tablet Take 1 tablet (10 mg total) by mouth daily. 90 tablet 1   triamcinolone  ointment (KENALOG ) 0.1 % Apply 1 Application topically daily as needed. 453 g 2   Current Facility-Administered Medications on File Prior to Visit  Medication Dose Route Frequency Provider Last Rate Last Admin   fluorouracil  (ADRUCIL ) chemo injection 50 mg  50 mg Intralesional Once         Review of Systems  Constitutional:  Positive for activity change, appetite change, fatigue and unexpected weight change. Negative for fever.  HENT:  Positive for trouble swallowing.        Lesions in mouth/ dry  Painful to eat fredy  Respiratory:  Positive for shortness of breath.   Cardiovascular:  Positive for leg swelling.  Gastrointestinal:  Positive for diarrhea. Negative for abdominal pain.  Musculoskeletal:  Positive for arthralgias and gait problem.  Skin:  Positive for rash and wound.  Neurological:  Positive for speech difficulty and weakness.  Hematological:  Bruises/bleeds easily.        Objective:   Physical Exam Constitutional:      Appearance: Normal appearance.     Comments: Weight is down/ unable to get on scale  Profoundly weak Frail  Unable to adjust position even to listen to lungs   Unable to use blood pressure cuff due to skin sores and sensitivity  Cannot get pulse ox today-will not read  HENT:     Mouth/Throat:     Mouth: Mucous membranes are dry.     Comments: Dry MM Tongue is coated/ cracking and lesions noted   Eyes:     General: No scleral icterus.    Conjunctiva/sclera: Conjunctivae normal.     Pupils: Pupils are equal, round, and reactive to light.    Cardiovascular:     Rate and Rhythm: Tachycardia present.  Pulmonary:     Breath sounds: No stridor. No rales.     Comments: Rapid breathing with movement  Seems short of breath at rest   Unable to speak beyond whisper   Distant bs Unable to lean forward for lung exam due to discomfort and weakness  Abdominal:     Palpations: Abdomen is soft.   Musculoskeletal:     Cervical back: Tenderness present.     Right lower leg: Edema present.     Left lower leg: Edema present.  Lymphadenopathy:     Cervical: No cervical adenopathy.   Skin:    Comments: Pt is covered with scabs and sores -whole body  Legs are crusted and swollen as well Sensitive over these areas    Neurological:     Mental Status: She is alert.     Motor: Weakness present.     Comments: Wheelchair bound Unable to change position   Psychiatric:     Comments: Quiet Depressed affect            Assessment & Plan:   Problem List Items Addressed This Visit       Musculoskeletal and Integument   Recurrent squamous cell carcinoma of skin - Primary (Chronic)   Pt has experienced severe inflammatory response to immunotx and retinoid and chemo  Condition deteriorating  Covered with painful sores/scabs , poor mobility and cachexic  Unable to care for herself/ not mobile  Not eating or drinking  Family  trying to care for her at home  Wishes to change to comfort care only with DNR status   Will let oncology know  Urgent hospice referral done        Relevant Orders   Ambulatory referral to Hospice   Squamous cell cancer of skin of left hand     Other   General weakness   With cachexia / severe inflam rxn to skin cancer treatment  Unable to move/eat/drink  Wishes to be comfort care  Referral made to hospice       Relevant Orders   Ambulatory referral to Hospice   Cachexia [facility])   From chronic dz/ squamous cell skin cancer and severe inflammatory reaction to treatment   Wishes to be comfort care  Unable to eat or drink and is immobile  Profoundly weak  Has lost great  amt of weight   Referral made for hospice       Relevant Orders   Ambulatory referral to Hospice

## 2024-02-23 NOTE — Assessment & Plan Note (Signed)
 With cachexia / severe inflam rxn to skin cancer treatment  Unable to move/eat/drink  Wishes to be comfort care  Referral made to hospice

## 2024-02-23 NOTE — Patient Instructions (Addendum)
 I will go ahead with referral to hospice so we can start comfort care  If you don't get a call in the next several days   When you check out you can update contact information    Please keep us  posted

## 2024-02-23 NOTE — Assessment & Plan Note (Signed)
 From chronic dz/ squamous cell skin cancer and severe inflammatory reaction to treatment   Wishes to be comfort care  Unable to eat or drink and is immobile  Profoundly weak  Has lost great amt of weight   Referral made for hospice

## 2024-02-23 NOTE — Telephone Encounter (Signed)
 Pt scheduled an in person appt today with PCP

## 2024-02-24 DIAGNOSIS — E039 Hypothyroidism, unspecified: Secondary | ICD-10-CM | POA: Diagnosis not present

## 2024-02-24 DIAGNOSIS — J302 Other seasonal allergic rhinitis: Secondary | ICD-10-CM | POA: Diagnosis not present

## 2024-02-24 DIAGNOSIS — J309 Allergic rhinitis, unspecified: Secondary | ICD-10-CM | POA: Diagnosis not present

## 2024-02-24 DIAGNOSIS — C792 Secondary malignant neoplasm of skin: Secondary | ICD-10-CM | POA: Diagnosis not present

## 2024-02-24 DIAGNOSIS — G893 Neoplasm related pain (acute) (chronic): Secondary | ICD-10-CM | POA: Diagnosis not present

## 2024-02-24 DIAGNOSIS — N3281 Overactive bladder: Secondary | ICD-10-CM | POA: Diagnosis not present

## 2024-02-24 DIAGNOSIS — C4492 Squamous cell carcinoma of skin, unspecified: Secondary | ICD-10-CM | POA: Diagnosis not present

## 2024-02-25 ENCOUNTER — Telehealth: Payer: Self-pay

## 2024-02-25 NOTE — Telephone Encounter (Signed)
 Spoke with patient's son, Maple Locus, who had left a VM informing us  that this patient does not wish to proceed to treatment at this time and has entered into the care of hospice at this time.

## 2024-02-26 DIAGNOSIS — G893 Neoplasm related pain (acute) (chronic): Secondary | ICD-10-CM | POA: Diagnosis not present

## 2024-02-26 DIAGNOSIS — C792 Secondary malignant neoplasm of skin: Secondary | ICD-10-CM | POA: Diagnosis not present

## 2024-02-26 DIAGNOSIS — N3281 Overactive bladder: Secondary | ICD-10-CM | POA: Diagnosis not present

## 2024-02-26 DIAGNOSIS — C4492 Squamous cell carcinoma of skin, unspecified: Secondary | ICD-10-CM | POA: Diagnosis not present

## 2024-02-26 DIAGNOSIS — E039 Hypothyroidism, unspecified: Secondary | ICD-10-CM | POA: Diagnosis not present

## 2024-02-26 DIAGNOSIS — J309 Allergic rhinitis, unspecified: Secondary | ICD-10-CM | POA: Diagnosis not present

## 2024-03-05 DIAGNOSIS — N3281 Overactive bladder: Secondary | ICD-10-CM | POA: Diagnosis not present

## 2024-03-05 DIAGNOSIS — J309 Allergic rhinitis, unspecified: Secondary | ICD-10-CM | POA: Diagnosis not present

## 2024-03-05 DIAGNOSIS — G893 Neoplasm related pain (acute) (chronic): Secondary | ICD-10-CM | POA: Diagnosis not present

## 2024-03-05 DIAGNOSIS — E039 Hypothyroidism, unspecified: Secondary | ICD-10-CM | POA: Diagnosis not present

## 2024-03-05 DIAGNOSIS — C4492 Squamous cell carcinoma of skin, unspecified: Secondary | ICD-10-CM | POA: Diagnosis not present

## 2024-03-05 DIAGNOSIS — C792 Secondary malignant neoplasm of skin: Secondary | ICD-10-CM | POA: Diagnosis not present

## 2024-03-11 ENCOUNTER — Ambulatory Visit

## 2024-03-11 ENCOUNTER — Ambulatory Visit: Admitting: Oncology

## 2024-03-11 ENCOUNTER — Other Ambulatory Visit

## 2024-03-12 DIAGNOSIS — N3281 Overactive bladder: Secondary | ICD-10-CM | POA: Diagnosis not present

## 2024-03-12 DIAGNOSIS — G893 Neoplasm related pain (acute) (chronic): Secondary | ICD-10-CM | POA: Diagnosis not present

## 2024-03-12 DIAGNOSIS — E039 Hypothyroidism, unspecified: Secondary | ICD-10-CM | POA: Diagnosis not present

## 2024-03-12 DIAGNOSIS — J309 Allergic rhinitis, unspecified: Secondary | ICD-10-CM | POA: Diagnosis not present

## 2024-03-12 DIAGNOSIS — C792 Secondary malignant neoplasm of skin: Secondary | ICD-10-CM | POA: Diagnosis not present

## 2024-03-12 DIAGNOSIS — C4492 Squamous cell carcinoma of skin, unspecified: Secondary | ICD-10-CM | POA: Diagnosis not present

## 2024-03-16 ENCOUNTER — Ambulatory Visit: Admitting: Dermatology

## 2024-03-17 ENCOUNTER — Other Ambulatory Visit: Payer: Self-pay | Admitting: Family Medicine

## 2024-03-19 DIAGNOSIS — J309 Allergic rhinitis, unspecified: Secondary | ICD-10-CM | POA: Diagnosis not present

## 2024-03-19 DIAGNOSIS — N3281 Overactive bladder: Secondary | ICD-10-CM | POA: Diagnosis not present

## 2024-03-19 DIAGNOSIS — C4492 Squamous cell carcinoma of skin, unspecified: Secondary | ICD-10-CM | POA: Diagnosis not present

## 2024-03-19 DIAGNOSIS — E039 Hypothyroidism, unspecified: Secondary | ICD-10-CM | POA: Diagnosis not present

## 2024-03-19 DIAGNOSIS — G893 Neoplasm related pain (acute) (chronic): Secondary | ICD-10-CM | POA: Diagnosis not present

## 2024-03-19 DIAGNOSIS — C792 Secondary malignant neoplasm of skin: Secondary | ICD-10-CM | POA: Diagnosis not present

## 2024-03-20 DIAGNOSIS — E039 Hypothyroidism, unspecified: Secondary | ICD-10-CM | POA: Diagnosis not present

## 2024-03-20 DIAGNOSIS — J309 Allergic rhinitis, unspecified: Secondary | ICD-10-CM | POA: Diagnosis not present

## 2024-03-20 DIAGNOSIS — G893 Neoplasm related pain (acute) (chronic): Secondary | ICD-10-CM | POA: Diagnosis not present

## 2024-03-20 DIAGNOSIS — C4492 Squamous cell carcinoma of skin, unspecified: Secondary | ICD-10-CM | POA: Diagnosis not present

## 2024-03-20 DIAGNOSIS — N3281 Overactive bladder: Secondary | ICD-10-CM | POA: Diagnosis not present

## 2024-03-20 DIAGNOSIS — C792 Secondary malignant neoplasm of skin: Secondary | ICD-10-CM | POA: Diagnosis not present

## 2024-03-21 DIAGNOSIS — J309 Allergic rhinitis, unspecified: Secondary | ICD-10-CM | POA: Diagnosis not present

## 2024-03-21 DIAGNOSIS — C4492 Squamous cell carcinoma of skin, unspecified: Secondary | ICD-10-CM | POA: Diagnosis not present

## 2024-03-21 DIAGNOSIS — E039 Hypothyroidism, unspecified: Secondary | ICD-10-CM | POA: Diagnosis not present

## 2024-03-21 DIAGNOSIS — C792 Secondary malignant neoplasm of skin: Secondary | ICD-10-CM | POA: Diagnosis not present

## 2024-03-21 DIAGNOSIS — G893 Neoplasm related pain (acute) (chronic): Secondary | ICD-10-CM | POA: Diagnosis not present

## 2024-03-21 DIAGNOSIS — N3281 Overactive bladder: Secondary | ICD-10-CM | POA: Diagnosis not present

## 2024-03-22 ENCOUNTER — Telehealth: Payer: Self-pay

## 2024-03-22 NOTE — Telephone Encounter (Signed)
 Linda Mcgrath

## 2024-03-26 DEATH — deceased

## 2024-04-16 ENCOUNTER — Other Ambulatory Visit: Payer: Self-pay | Admitting: Family Medicine

## 2024-04-20 NOTE — Telephone Encounter (Signed)
 Opened in error

## 2024-10-01 ENCOUNTER — Ambulatory Visit: Payer: Medicare Other
